# Patient Record
Sex: Male | Born: 1935
Health system: Southern US, Community
[De-identification: ages and names within clinical notes are randomized; demographics above are authoritative.]

## PROBLEM LIST (undated history)

## (undated) DIAGNOSIS — G709 Myoneural disorder, unspecified: Secondary | ICD-10-CM

## (undated) DIAGNOSIS — M109 Gout, unspecified: Secondary | ICD-10-CM

## (undated) DIAGNOSIS — D509 Iron deficiency anemia, unspecified: Secondary | ICD-10-CM

## (undated) DIAGNOSIS — Z9089 Acquired absence of other organs: Secondary | ICD-10-CM

## (undated) DIAGNOSIS — M1990 Unspecified osteoarthritis, unspecified site: Secondary | ICD-10-CM

## (undated) DIAGNOSIS — Z9842 Cataract extraction status, left eye: Secondary | ICD-10-CM

## (undated) DIAGNOSIS — G629 Polyneuropathy, unspecified: Secondary | ICD-10-CM

## (undated) DIAGNOSIS — I10 Essential (primary) hypertension: Secondary | ICD-10-CM

## (undated) DIAGNOSIS — J45909 Unspecified asthma, uncomplicated: Secondary | ICD-10-CM

## (undated) DIAGNOSIS — Z9841 Cataract extraction status, right eye: Secondary | ICD-10-CM

## (undated) DIAGNOSIS — J449 Chronic obstructive pulmonary disease, unspecified: Secondary | ICD-10-CM

## (undated) DIAGNOSIS — J189 Pneumonia, unspecified organism: Secondary | ICD-10-CM

## (undated) DIAGNOSIS — E119 Type 2 diabetes mellitus without complications: Secondary | ICD-10-CM

## (undated) HISTORY — DX: Essential (primary) hypertension: I10

## (undated) HISTORY — PX: OTHER SURGICAL HISTORY: SHX169

## (undated) HISTORY — DX: Iron deficiency anemia, unspecified: D50.9

## (undated) HISTORY — DX: Cataract extraction status, left eye: Z98.42

## (undated) HISTORY — DX: Type 2 diabetes mellitus without complications: E11.9

## (undated) HISTORY — DX: Unspecified osteoarthritis, unspecified site: M19.90

## (undated) HISTORY — DX: Gout, unspecified: M10.9

## (undated) HISTORY — DX: Cataract extraction status, right eye: Z98.41

## (undated) HISTORY — PX: CHOLECYSTECTOMY: SHX55

## (undated) HISTORY — DX: Acquired absence of other organs: Z90.89

---

## 1997-08-20 ENCOUNTER — Other Ambulatory Visit: Admission: RE | Admit: 1997-08-20 | Discharge: 1997-08-20 | Payer: Self-pay | Admitting: Family Medicine

## 2005-03-07 DIAGNOSIS — M109 Gout, unspecified: Secondary | ICD-10-CM

## 2005-03-07 HISTORY — DX: Gout, unspecified: M10.9

## 2006-11-13 ENCOUNTER — Inpatient Hospital Stay (HOSPITAL_COMMUNITY): Admission: EM | Admit: 2006-11-13 | Discharge: 2006-11-26 | Payer: Self-pay | Admitting: Emergency Medicine

## 2006-11-14 ENCOUNTER — Encounter (INDEPENDENT_AMBULATORY_CARE_PROVIDER_SITE_OTHER): Payer: Self-pay | Admitting: *Deleted

## 2006-12-11 ENCOUNTER — Ambulatory Visit: Payer: Self-pay | Admitting: Oncology

## 2006-12-11 ENCOUNTER — Inpatient Hospital Stay (HOSPITAL_COMMUNITY): Admission: EM | Admit: 2006-12-11 | Discharge: 2006-12-15 | Payer: Self-pay | Admitting: Emergency Medicine

## 2007-01-08 ENCOUNTER — Ambulatory Visit: Payer: Self-pay | Admitting: Internal Medicine

## 2007-01-09 ENCOUNTER — Inpatient Hospital Stay (HOSPITAL_COMMUNITY): Admission: EM | Admit: 2007-01-09 | Discharge: 2007-01-12 | Payer: Self-pay | Admitting: Emergency Medicine

## 2007-01-12 ENCOUNTER — Telehealth: Payer: Self-pay | Admitting: Family Medicine

## 2007-01-15 ENCOUNTER — Encounter (INDEPENDENT_AMBULATORY_CARE_PROVIDER_SITE_OTHER): Payer: Self-pay | Admitting: *Deleted

## 2007-01-15 ENCOUNTER — Ambulatory Visit: Payer: Self-pay | Admitting: Sports Medicine

## 2007-01-15 ENCOUNTER — Encounter: Payer: Self-pay | Admitting: Family Medicine

## 2007-01-15 DIAGNOSIS — J45909 Unspecified asthma, uncomplicated: Secondary | ICD-10-CM | POA: Insufficient documentation

## 2007-01-15 DIAGNOSIS — D509 Iron deficiency anemia, unspecified: Secondary | ICD-10-CM

## 2007-01-15 DIAGNOSIS — M109 Gout, unspecified: Secondary | ICD-10-CM | POA: Insufficient documentation

## 2007-01-15 HISTORY — DX: Iron deficiency anemia, unspecified: D50.9

## 2007-01-23 ENCOUNTER — Encounter: Payer: Self-pay | Admitting: Family Medicine

## 2007-01-31 ENCOUNTER — Encounter: Payer: Self-pay | Admitting: Family Medicine

## 2007-02-15 ENCOUNTER — Encounter: Payer: Self-pay | Admitting: Family Medicine

## 2007-02-15 ENCOUNTER — Ambulatory Visit: Payer: Self-pay | Admitting: Family Medicine

## 2007-02-15 LAB — CONVERTED CEMR LAB
HCT: 35.6 % — ABNORMAL LOW (ref 39.0–52.0)
Hemoglobin: 11.9 g/dL — ABNORMAL LOW (ref 13.0–17.0)
MCHC: 33.4 g/dL (ref 30.0–36.0)
MCV: 77.9 fL — ABNORMAL LOW (ref 78.0–100.0)
Platelets: 195 10*3/uL (ref 150–400)
RBC: 4.57 M/uL (ref 4.22–5.81)
RDW: 17 % — ABNORMAL HIGH (ref 11.5–15.5)
WBC: 11.5 10*3/uL — ABNORMAL HIGH (ref 4.0–10.5)

## 2007-02-19 ENCOUNTER — Telehealth: Payer: Self-pay | Admitting: *Deleted

## 2007-02-20 ENCOUNTER — Ambulatory Visit: Payer: Self-pay | Admitting: Family Medicine

## 2007-02-20 ENCOUNTER — Inpatient Hospital Stay (HOSPITAL_COMMUNITY): Admission: EM | Admit: 2007-02-20 | Discharge: 2007-02-22 | Payer: Self-pay | Admitting: Emergency Medicine

## 2007-02-20 ENCOUNTER — Encounter: Payer: Self-pay | Admitting: Family Medicine

## 2007-02-26 ENCOUNTER — Telehealth (INDEPENDENT_AMBULATORY_CARE_PROVIDER_SITE_OTHER): Payer: Self-pay | Admitting: Family Medicine

## 2007-03-07 ENCOUNTER — Encounter: Payer: Self-pay | Admitting: Family Medicine

## 2007-03-12 ENCOUNTER — Ambulatory Visit: Payer: Self-pay | Admitting: Sports Medicine

## 2007-03-12 ENCOUNTER — Encounter: Admission: RE | Admit: 2007-03-12 | Discharge: 2007-03-12 | Payer: Self-pay | Admitting: Sports Medicine

## 2007-03-12 LAB — CONVERTED CEMR LAB
Bilirubin Urine: NEGATIVE
Blood in Urine, dipstick: NEGATIVE
Glucose, Urine, Semiquant: NEGATIVE
Ketones, urine, test strip: NEGATIVE
Nitrite: NEGATIVE
Protein, U semiquant: NEGATIVE
Specific Gravity, Urine: <1.005
Urobilinogen, UA: 0.2
WBC Urine, dipstick: NEGATIVE
pH: 6.5

## 2007-03-16 ENCOUNTER — Telehealth: Payer: Self-pay | Admitting: *Deleted

## 2007-03-20 ENCOUNTER — Ambulatory Visit: Payer: Self-pay | Admitting: Family Medicine

## 2007-03-20 ENCOUNTER — Encounter: Payer: Self-pay | Admitting: Family Medicine

## 2007-03-20 DIAGNOSIS — J449 Chronic obstructive pulmonary disease, unspecified: Secondary | ICD-10-CM | POA: Insufficient documentation

## 2007-03-20 DIAGNOSIS — F17200 Nicotine dependence, unspecified, uncomplicated: Secondary | ICD-10-CM | POA: Insufficient documentation

## 2007-04-04 ENCOUNTER — Encounter (INDEPENDENT_AMBULATORY_CARE_PROVIDER_SITE_OTHER): Payer: Self-pay | Admitting: Surgery

## 2007-04-04 ENCOUNTER — Ambulatory Visit (HOSPITAL_COMMUNITY): Admission: RE | Admit: 2007-04-04 | Discharge: 2007-04-05 | Payer: Self-pay | Admitting: Surgery

## 2007-04-12 ENCOUNTER — Ambulatory Visit: Payer: Self-pay | Admitting: Family Medicine

## 2007-04-12 ENCOUNTER — Encounter: Payer: Self-pay | Admitting: Family Medicine

## 2007-04-12 DIAGNOSIS — Z9089 Acquired absence of other organs: Secondary | ICD-10-CM

## 2007-04-12 HISTORY — DX: Acquired absence of other organs: Z90.89

## 2007-04-12 LAB — CONVERTED CEMR LAB
Bilirubin Urine: NEGATIVE
Glucose, Urine, Semiquant: NEGATIVE
Ketones, urine, test strip: NEGATIVE
Nitrite: POSITIVE
Protein, U semiquant: 30
Urobilinogen, UA: 0.2
pH: 6

## 2007-09-11 ENCOUNTER — Ambulatory Visit: Payer: Self-pay | Admitting: Family Medicine

## 2007-09-14 ENCOUNTER — Ambulatory Visit: Payer: Self-pay | Admitting: Family Medicine

## 2007-09-14 ENCOUNTER — Encounter: Payer: Self-pay | Admitting: Family Medicine

## 2007-09-16 LAB — CONVERTED CEMR LAB
BUN: 15 mg/dL (ref 6–23)
Basophils Absolute: 0 10*3/uL (ref 0.0–0.1)
Basophils Relative: 0 % (ref 0–1)
CO2: 23 meq/L (ref 19–32)
Calcium: 9.2 mg/dL (ref 8.4–10.5)
Chloride: 106 meq/L (ref 96–112)
Creatinine, Ser: 0.97 mg/dL (ref 0.40–1.50)
Eosinophils Absolute: 0.7 10*3/uL (ref 0.0–0.7)
Eosinophils Relative: 6 % — ABNORMAL HIGH (ref 0–5)
Glucose, Bld: 85 mg/dL (ref 70–99)
HCT: 37 % — ABNORMAL LOW (ref 39.0–52.0)
Hemoglobin: 12.6 g/dL — ABNORMAL LOW (ref 13.0–17.0)
Lymphocytes Relative: 39 % (ref 12–46)
Lymphs Abs: 4.5 10*3/uL — ABNORMAL HIGH (ref 0.7–4.0)
MCHC: 34.1 g/dL (ref 30.0–36.0)
MCV: 75.7 fL — ABNORMAL LOW (ref 78.0–100.0)
Monocytes Absolute: 0.8 10*3/uL (ref 0.1–1.0)
Monocytes Relative: 7 % (ref 3–12)
Neutro Abs: 5.4 10*3/uL (ref 1.7–7.7)
Neutrophils Relative %: 48 % (ref 43–77)
Platelets: 259 10*3/uL (ref 150–400)
Potassium: 4.5 meq/L (ref 3.5–5.3)
RBC: 4.89 M/uL (ref 4.22–5.81)
RDW: 15.6 % — ABNORMAL HIGH (ref 11.5–15.5)
Sodium: 140 meq/L (ref 135–145)
WBC: 11.4 10*3/uL — ABNORMAL HIGH (ref 4.0–10.5)

## 2008-01-24 ENCOUNTER — Encounter: Payer: Self-pay | Admitting: Family Medicine

## 2008-04-29 ENCOUNTER — Ambulatory Visit: Payer: Self-pay | Admitting: Family Medicine

## 2008-04-29 ENCOUNTER — Encounter: Payer: Self-pay | Admitting: Family Medicine

## 2008-04-29 LAB — CONVERTED CEMR LAB
Glucose, Urine, Semiquant: NEGATIVE
PSA: 2.13 ng/mL

## 2008-05-02 ENCOUNTER — Encounter: Payer: Self-pay | Admitting: Family Medicine

## 2008-05-02 LAB — CONVERTED CEMR LAB
BUN: 19 mg/dL (ref 6–23)
CO2: 18 meq/L — ABNORMAL LOW (ref 19–32)
Calcium: 9.1 mg/dL (ref 8.4–10.5)
Chloride: 109 meq/L (ref 96–112)
Creatinine, Ser: 1 mg/dL (ref 0.40–1.50)
Glucose, Bld: 83 mg/dL (ref 70–99)
HCT: 40.4 % (ref 39.0–52.0)
Hemoglobin: 13.9 g/dL (ref 13.0–17.0)
MCHC: 34.4 g/dL (ref 30.0–36.0)
MCV: 78.6 fL (ref 78.0–100.0)
PSA: 2.13 ng/mL (ref 0.10–4.00)
Platelets: 215 10*3/uL (ref 150–400)
Potassium: 4.7 meq/L (ref 3.5–5.3)
RBC: 5.14 M/uL (ref 4.22–5.81)
RDW: 15.8 % — ABNORMAL HIGH (ref 11.5–15.5)
Sodium: 143 meq/L (ref 135–145)
WBC: 13.1 10*3/uL — ABNORMAL HIGH (ref 4.0–10.5)

## 2008-05-09 ENCOUNTER — Encounter: Payer: Self-pay | Admitting: Family Medicine

## 2008-06-03 ENCOUNTER — Encounter: Payer: Self-pay | Admitting: Family Medicine

## 2008-09-04 ENCOUNTER — Ambulatory Visit: Payer: Self-pay | Admitting: Family Medicine

## 2008-09-04 ENCOUNTER — Encounter: Payer: Self-pay | Admitting: Family Medicine

## 2008-09-04 DIAGNOSIS — R5383 Other fatigue: Secondary | ICD-10-CM | POA: Insufficient documentation

## 2008-09-04 DIAGNOSIS — R5381 Other malaise: Secondary | ICD-10-CM | POA: Insufficient documentation

## 2008-09-04 LAB — CONVERTED CEMR LAB
ALT: 13 units/L (ref 0–53)
AST: 12 units/L (ref 0–37)
Albumin: 4.5 g/dL (ref 3.5–5.2)
Alkaline Phosphatase: 91 units/L (ref 39–117)
BUN: 16 mg/dL (ref 6–23)
Bilirubin Urine: NEGATIVE
CO2: 22 meq/L (ref 19–32)
Calcium: 8.9 mg/dL (ref 8.4–10.5)
Chloride: 105 meq/L (ref 96–112)
Creatinine, Ser: 1.24 mg/dL (ref 0.40–1.50)
Glucose, Bld: 67 mg/dL — ABNORMAL LOW (ref 70–99)
Glucose, Urine, Semiquant: NEGATIVE
HCT: 37.8 % — ABNORMAL LOW (ref 39.0–52.0)
Hemoglobin: 13.3 g/dL (ref 13.0–17.0)
Ketones, urine, test strip: NEGATIVE
MCHC: 35.2 g/dL (ref 30.0–36.0)
MCV: 78.6 fL (ref 78.0–100.0)
Nitrite: POSITIVE
Platelets: 168 10*3/uL (ref 150–400)
Potassium: 4.1 meq/L (ref 3.5–5.3)
Protein, U semiquant: 100
RBC: 4.81 M/uL (ref 4.22–5.81)
RDW: 15.7 % — ABNORMAL HIGH (ref 11.5–15.5)
Sodium: 141 meq/L (ref 135–145)
Specific Gravity, Urine: 1.015
Total Bilirubin: 0.9 mg/dL (ref 0.3–1.2)
Total Protein: 7 g/dL (ref 6.0–8.3)
Urobilinogen, UA: 1
WBC, UA: >20 cells/hpf
WBC: 24.4 10*3/uL — ABNORMAL HIGH (ref 4.0–10.5)
pH: 5.5

## 2008-09-05 ENCOUNTER — Encounter: Payer: Self-pay | Admitting: Family Medicine

## 2008-09-09 ENCOUNTER — Encounter: Admission: RE | Admit: 2008-09-09 | Discharge: 2008-09-09 | Payer: Self-pay | Admitting: Family Medicine

## 2008-09-12 ENCOUNTER — Ambulatory Visit: Payer: Self-pay | Admitting: Family Medicine

## 2008-09-12 ENCOUNTER — Encounter: Admission: RE | Admit: 2008-09-12 | Discharge: 2008-09-12 | Payer: Self-pay | Admitting: Family Medicine

## 2008-09-17 ENCOUNTER — Encounter: Payer: Self-pay | Admitting: Family Medicine

## 2008-10-03 ENCOUNTER — Ambulatory Visit: Payer: Self-pay | Admitting: Family Medicine

## 2008-12-23 ENCOUNTER — Encounter: Payer: Self-pay | Admitting: Family Medicine

## 2009-01-16 ENCOUNTER — Ambulatory Visit: Payer: Self-pay | Admitting: Family Medicine

## 2009-07-01 ENCOUNTER — Encounter: Payer: Self-pay | Admitting: Family Medicine

## 2009-07-01 ENCOUNTER — Ambulatory Visit: Payer: Self-pay | Admitting: Family Medicine

## 2009-07-01 ENCOUNTER — Encounter: Admission: RE | Admit: 2009-07-01 | Discharge: 2009-07-01 | Payer: Self-pay | Admitting: Family Medicine

## 2009-07-01 DIAGNOSIS — M545 Low back pain, unspecified: Secondary | ICD-10-CM | POA: Insufficient documentation

## 2009-07-01 DIAGNOSIS — M25559 Pain in unspecified hip: Secondary | ICD-10-CM | POA: Insufficient documentation

## 2009-07-02 LAB — CONVERTED CEMR LAB
ALT: 12 units/L (ref 0–53)
AST: 11 units/L (ref 0–37)
Albumin: 4.3 g/dL (ref 3.5–5.2)
Alkaline Phosphatase: 80 units/L (ref 39–117)
BUN: 16 mg/dL (ref 6–23)
Basophils Absolute: 0.1 10*3/uL (ref 0.0–0.1)
Basophils Relative: 0 % (ref 0–1)
CO2: 20 meq/L (ref 19–32)
Calcium: 9.1 mg/dL (ref 8.4–10.5)
Chloride: 107 meq/L (ref 96–112)
Creatinine, Ser: 1.05 mg/dL (ref 0.40–1.50)
Eosinophils Absolute: 0.8 10*3/uL — ABNORMAL HIGH (ref 0.0–0.7)
Eosinophils Relative: 6 % — ABNORMAL HIGH (ref 0–5)
Glucose, Bld: 90 mg/dL (ref 70–99)
HCT: 39.5 % (ref 39.0–52.0)
Hemoglobin: 13.4 g/dL (ref 13.0–17.0)
Lymphocytes Relative: 28 % (ref 12–46)
Lymphs Abs: 4.1 10*3/uL — ABNORMAL HIGH (ref 0.7–4.0)
MCHC: 33.9 g/dL (ref 30.0–36.0)
MCV: 75.5 fL — ABNORMAL LOW (ref 78.0–100.0)
Monocytes Absolute: 1 10*3/uL (ref 0.1–1.0)
Monocytes Relative: 7 % (ref 3–12)
Neutro Abs: 8.9 10*3/uL — ABNORMAL HIGH (ref 1.7–7.7)
Neutrophils Relative %: 60 % (ref 43–77)
Platelets: 218 10*3/uL (ref 150–400)
Potassium: 3.6 meq/L (ref 3.5–5.3)
RBC: 5.23 M/uL (ref 4.22–5.81)
RDW: 15.6 % — ABNORMAL HIGH (ref 11.5–15.5)
Sodium: 140 meq/L (ref 135–145)
Total Bilirubin: 0.4 mg/dL (ref 0.3–1.2)
Total Protein: 7.2 g/dL (ref 6.0–8.3)
WBC: 14.9 10*3/uL — ABNORMAL HIGH (ref 4.0–10.5)

## 2009-09-10 ENCOUNTER — Ambulatory Visit: Payer: Self-pay | Admitting: Family Medicine

## 2009-09-10 DIAGNOSIS — F528 Other sexual dysfunction not due to a substance or known physiological condition: Secondary | ICD-10-CM | POA: Insufficient documentation

## 2009-09-17 ENCOUNTER — Encounter: Payer: Self-pay | Admitting: Family Medicine

## 2009-09-17 DIAGNOSIS — N529 Male erectile dysfunction, unspecified: Secondary | ICD-10-CM | POA: Insufficient documentation

## 2009-11-25 IMAGING — CR DG CHEST 2V
2 series · 2 of 2 positions shown · non-contrast
Comparison: 11/17/06.

CLINICAL DATA: Abdominal pain.
 CHEST ? 2 VIEW:

[w chest pa]
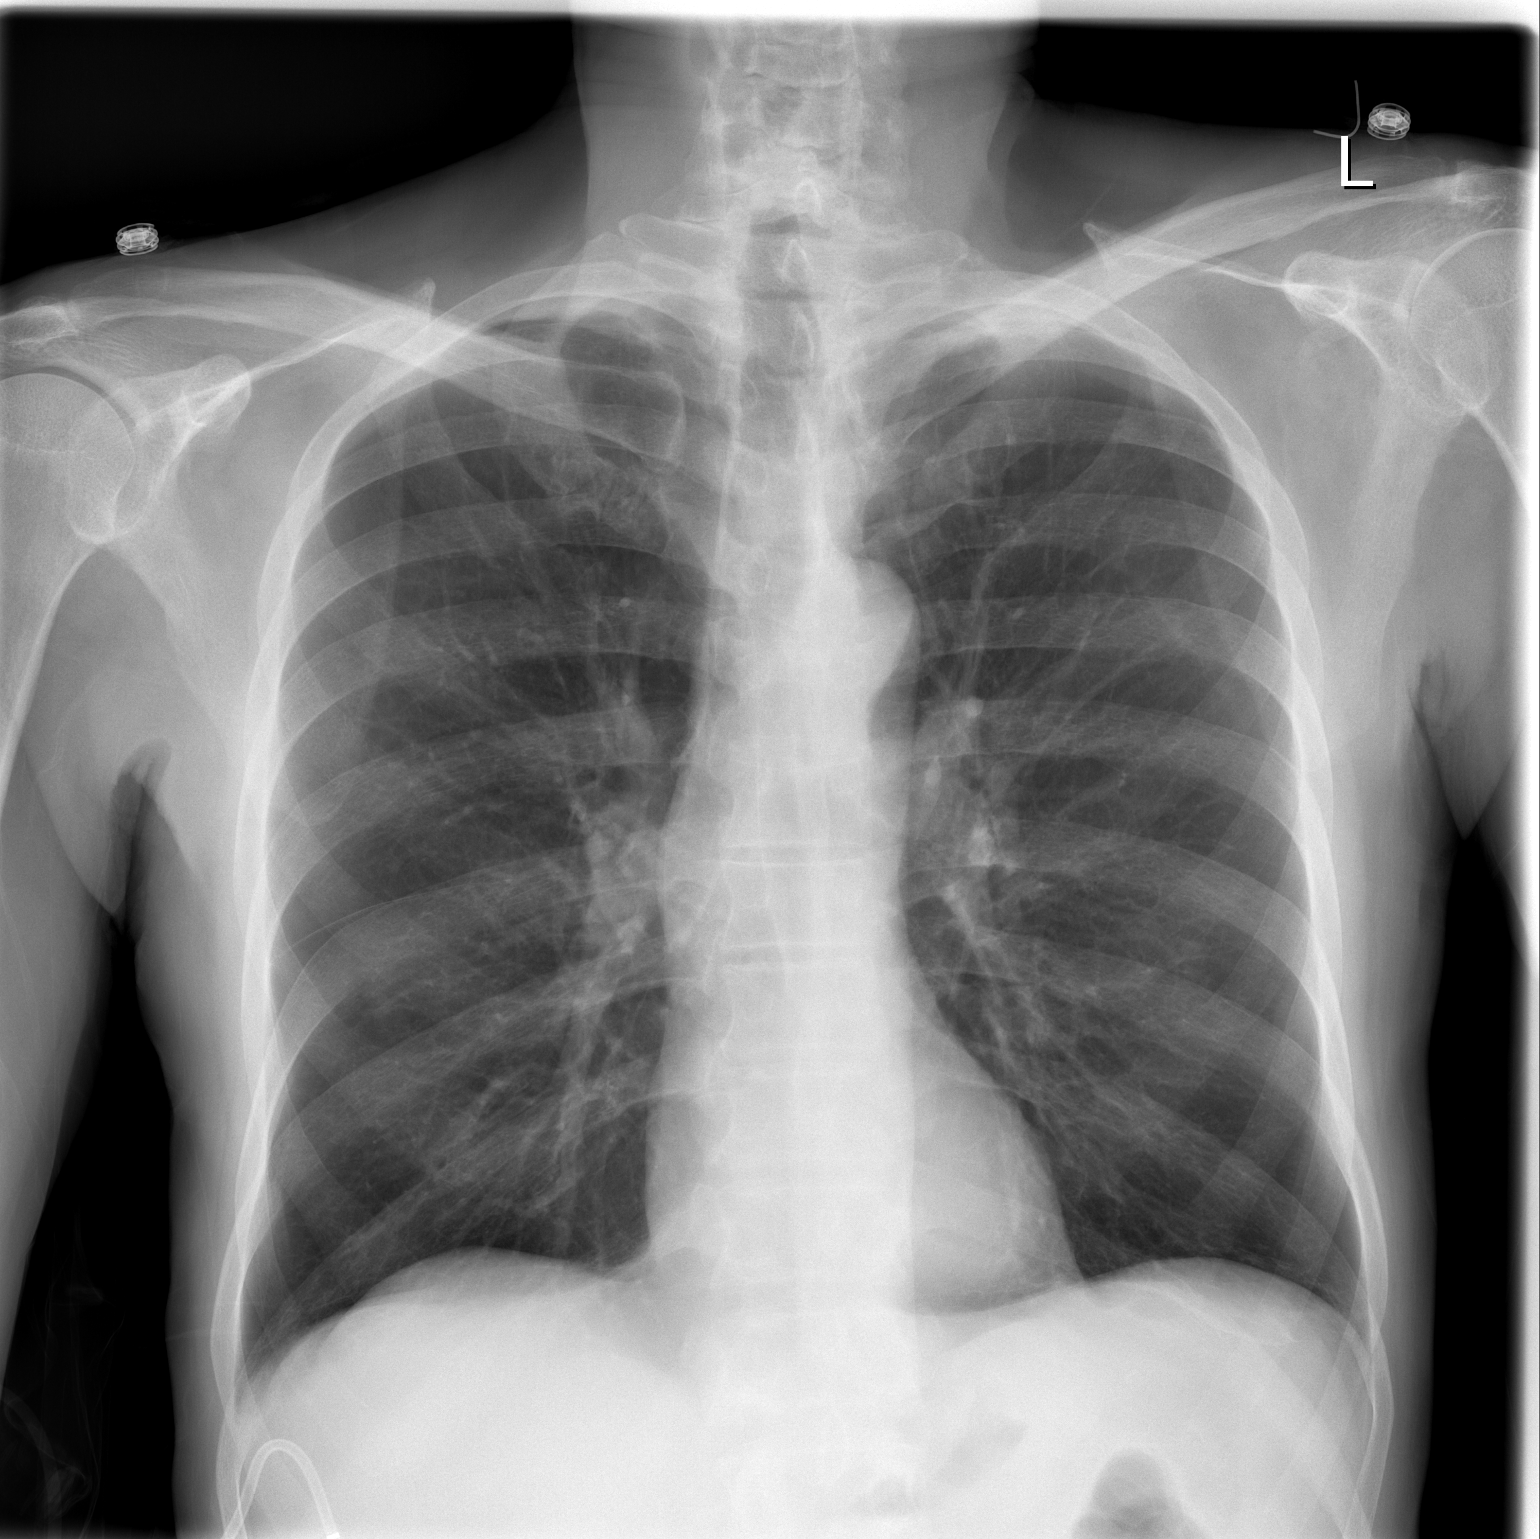

[w chest lat]
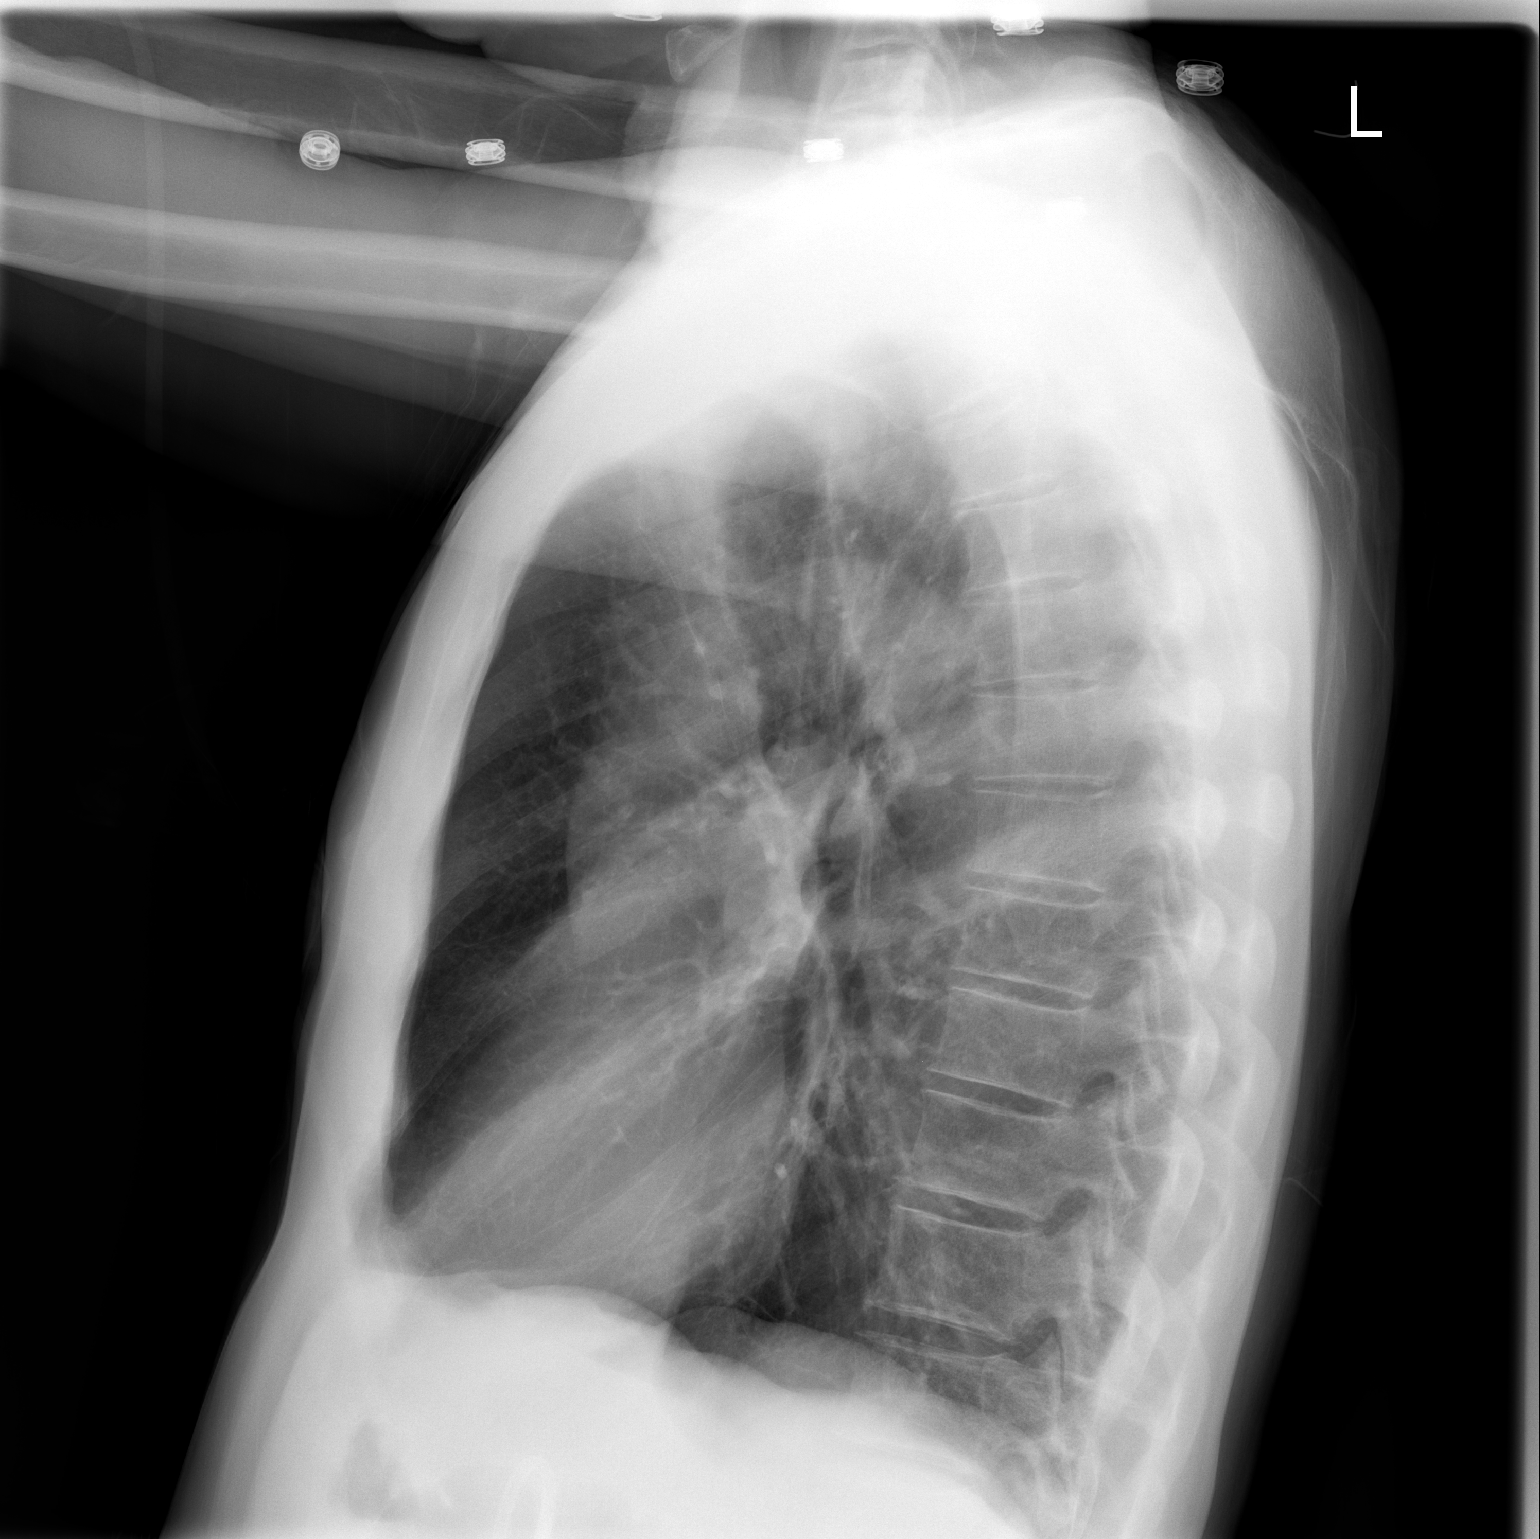

[2 of 2 positions shown; findings below may reference images not displayed]

FINDINGS: Nasogastric tube and left PICC line have been removed.  Lungs are better aerated and clear.  Hyperinflation suggests COPD.  Heart size is normal.  A catheter projects over the right upper quadrant but is partly visualized.  No acute osseous finding.
IMPRESSION: COPD.  No acute cardiopulmonary process.

## 2009-11-25 IMAGING — CR DG ABDOMEN 2V
2 series · 2 of 2 positions shown · non-contrast
Comparison: none

CLINICAL DATA: Abdominal pain, elevated liver enzymes.  
 ABDOMEN ? 2 VIEW:

[w abdomen upright]
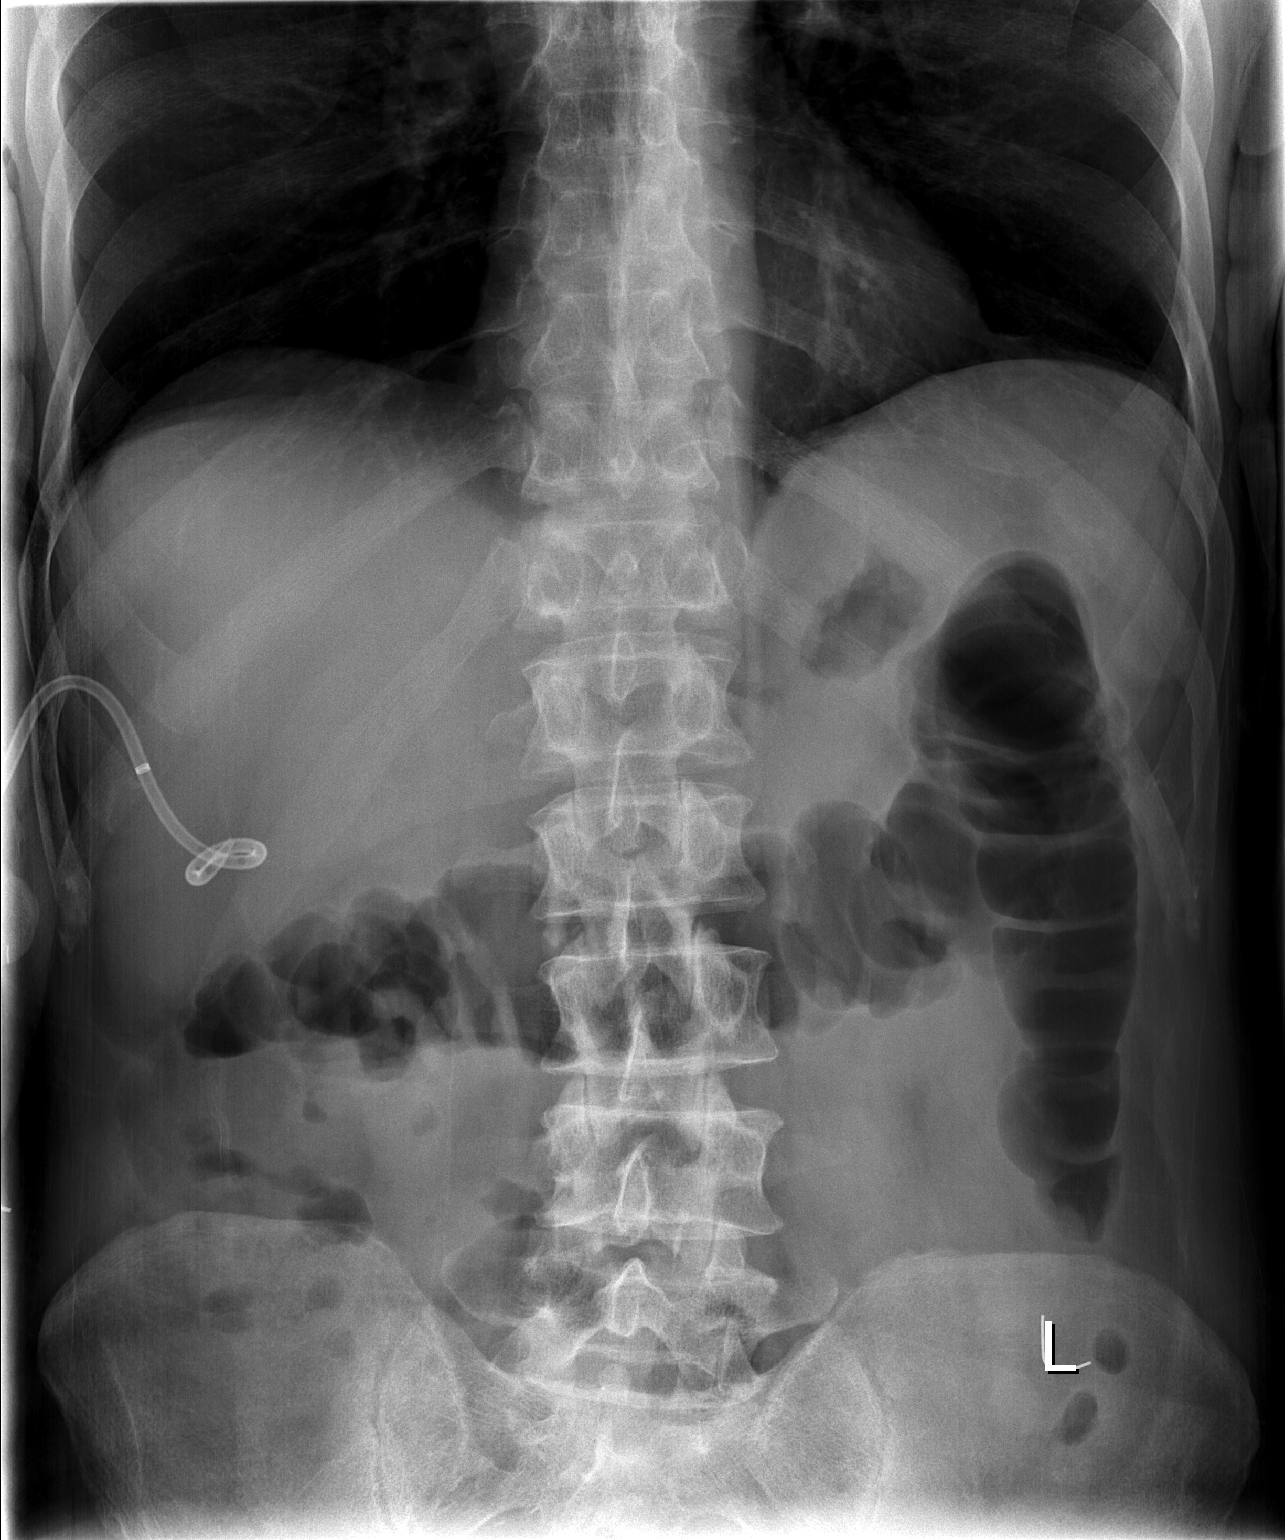

[t abdomen supine]
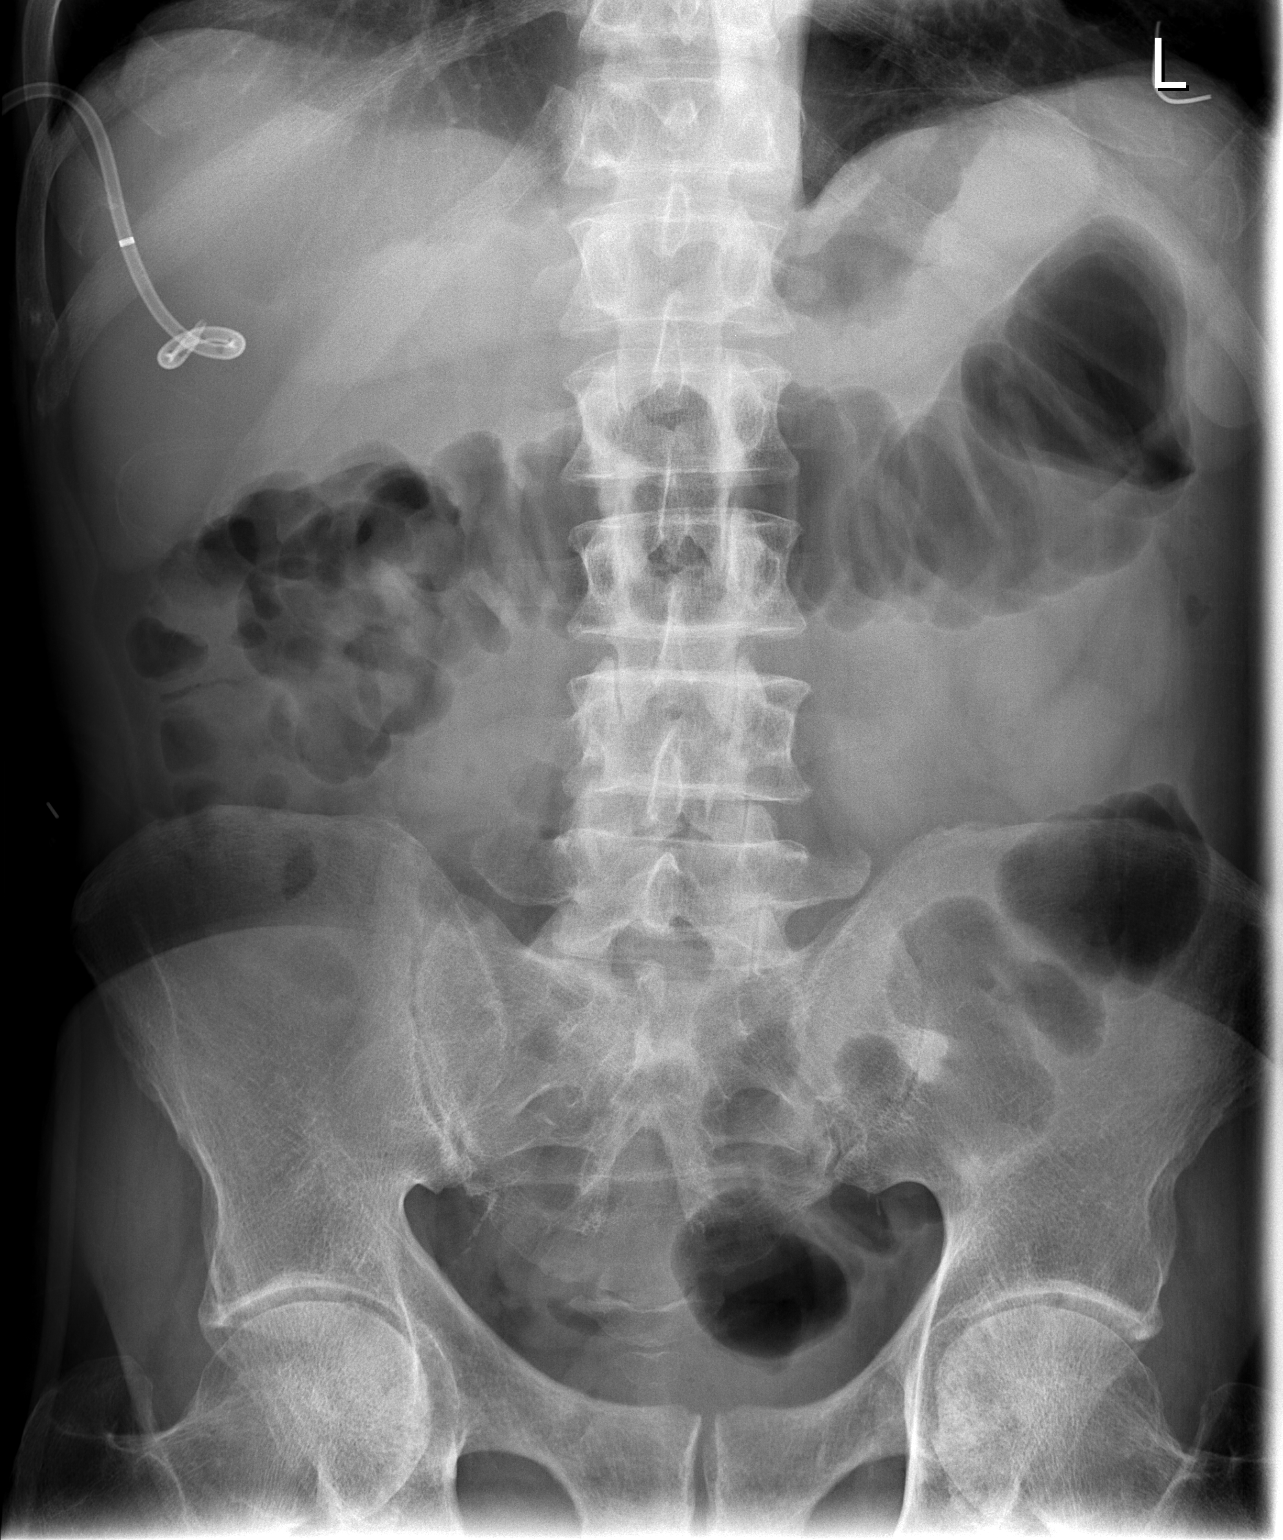

[2 of 2 positions shown; findings below may reference images not displayed]

FINDINGS: Right upper quadrant pigtail catheter in place.  Bowel gas pattern is normal.  No free air beneath the diaphragms.  No abnormal calcific opacity.  No acute bony finding.
IMPRESSION: No acute intraabdominal finding.

## 2009-11-26 IMAGING — XA IR BILIARY CATHETER EXCHANGE
1 series · 7 of 7 positions shown · non-contrast
Comparison: none

CLINICAL DATA: Chronic cholecystitis.
EXCHANGE OF CHRONIC TRANSHEPATIC CHOLECYSTOSTOMY CATHETER:
Guidance:  Fluoroscopic.
Complications:  No immediate.
Procedure/Findings:   Informed consent was obtained from the patient following explanation of the procedure, risks, benefits, and alternatives.  The patient understands, agrees, and consents.  All questions were addressed.
In the right upper quadrant, the existing cholecystostomy catheter was prepped and draped in the usual fashion.  Contrast injection confirms position in the gallbladder fundus.  The catheter was cut and removed over a guidewire followed by advancement of a new 10 French catheter with the retention loop formed more centrally in the gallbladder close to the cystic duct origin.  Contrast injection confirms postiion in the gallbladder.  Cystic duct is not visualized.  Catheter was secured with a 0 Prolene suture and connected to external drainage.

[Series 1: run · 7 of 7 slices shown]
[im 1/7]
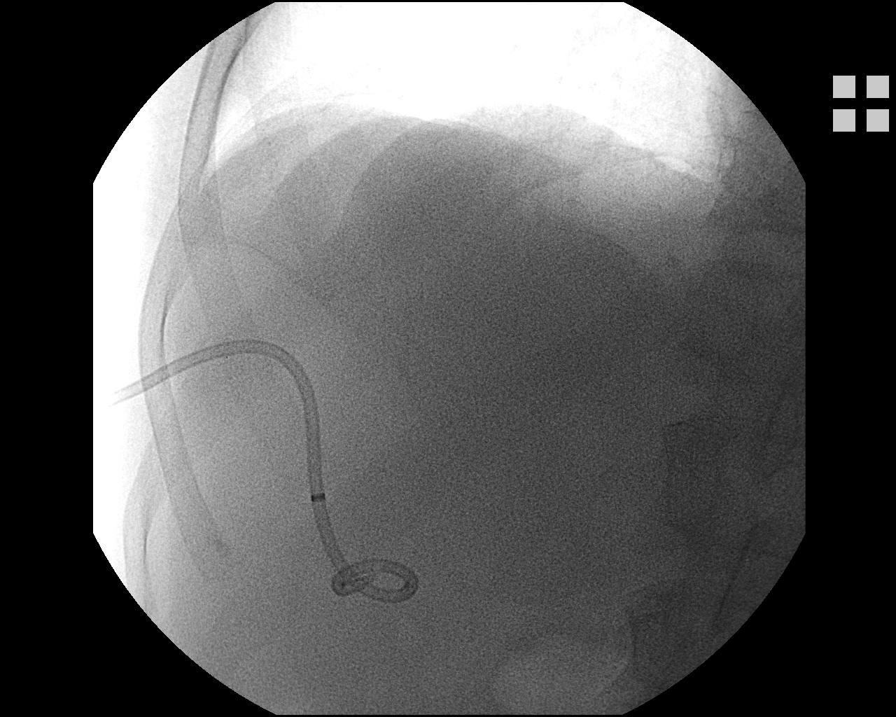
[im 2/7]
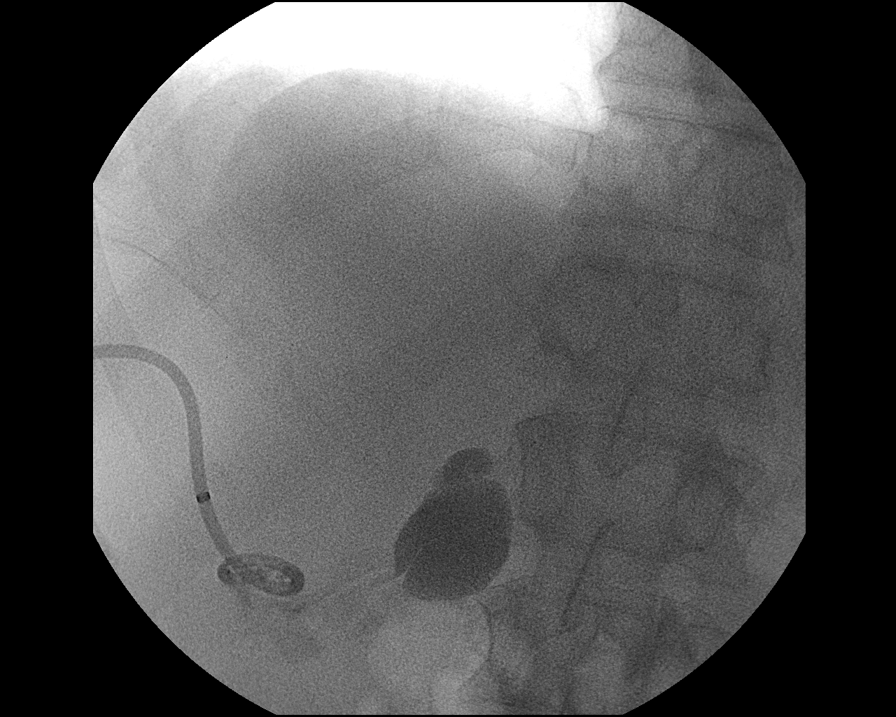
[im 3/7]
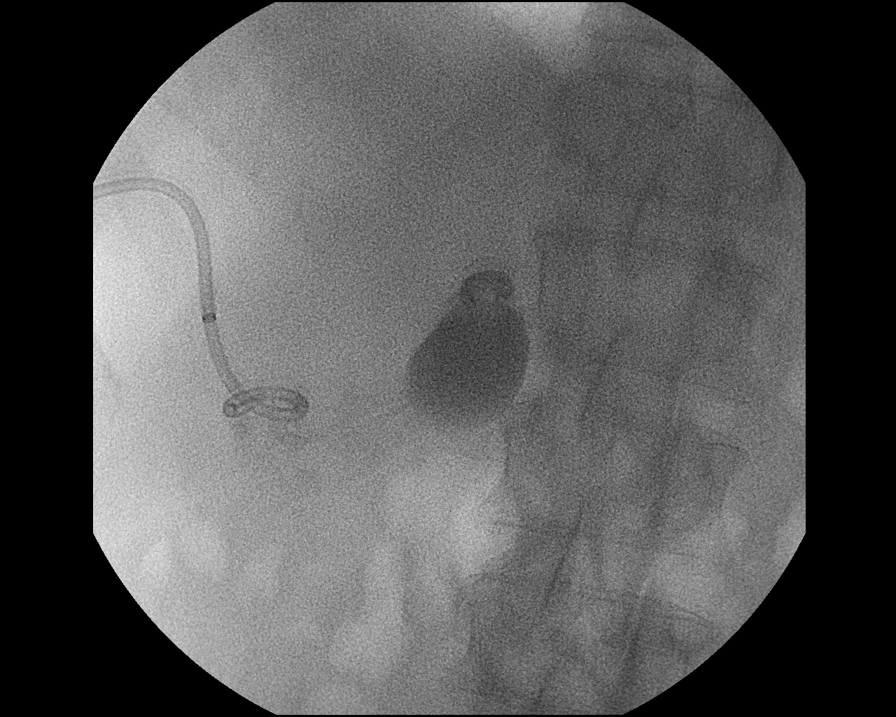
[im 4/7]
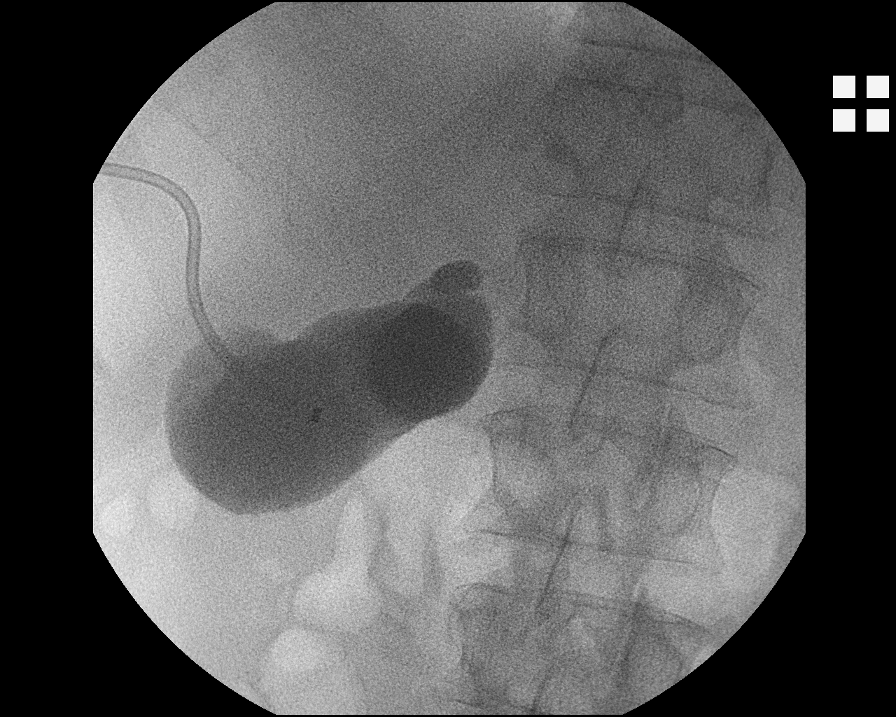
[im 5/7]
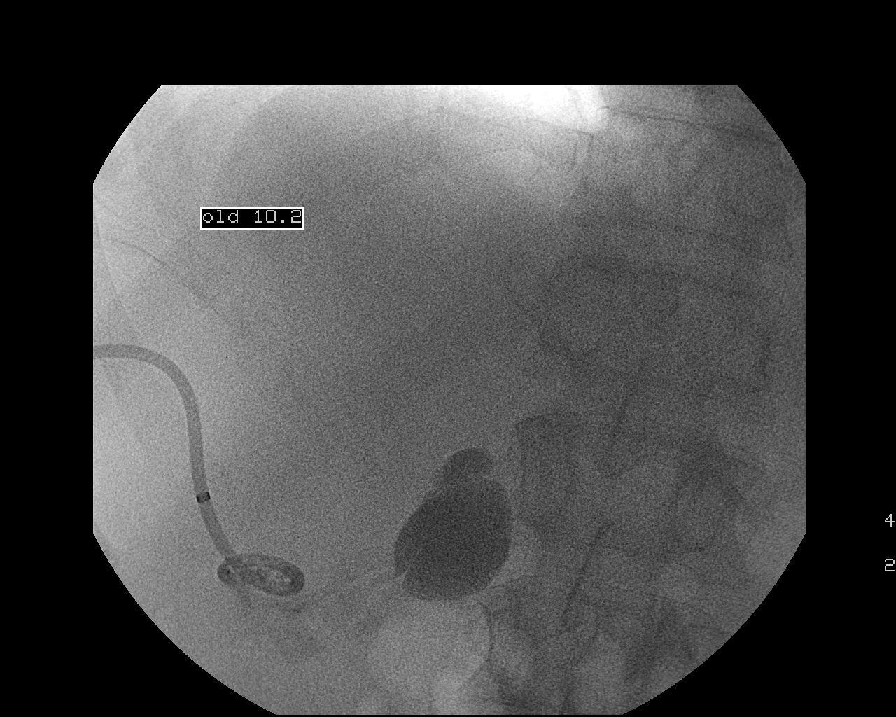
[im 6/7]
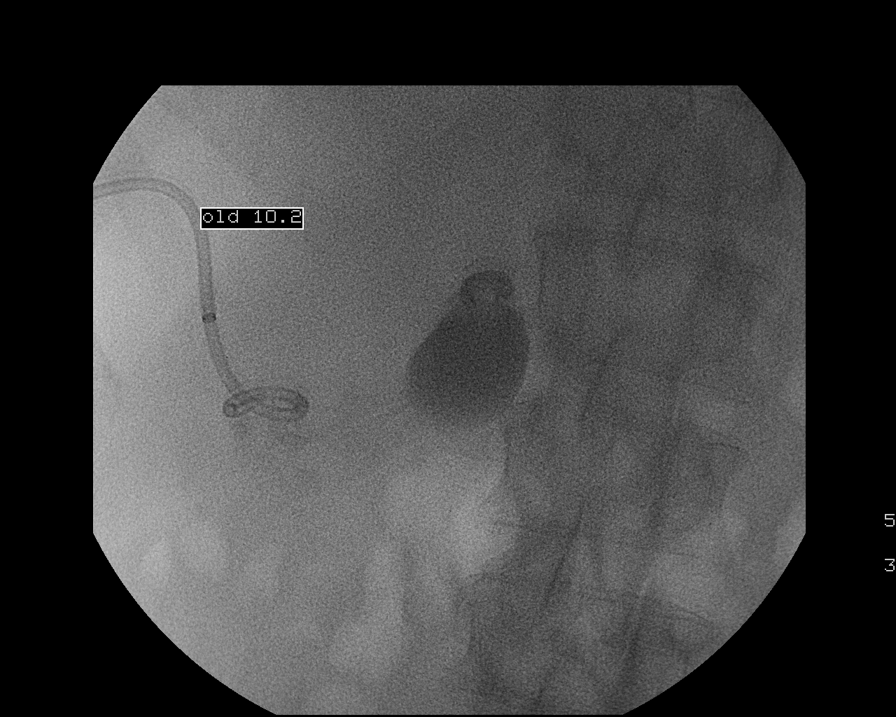
[im 7/7]
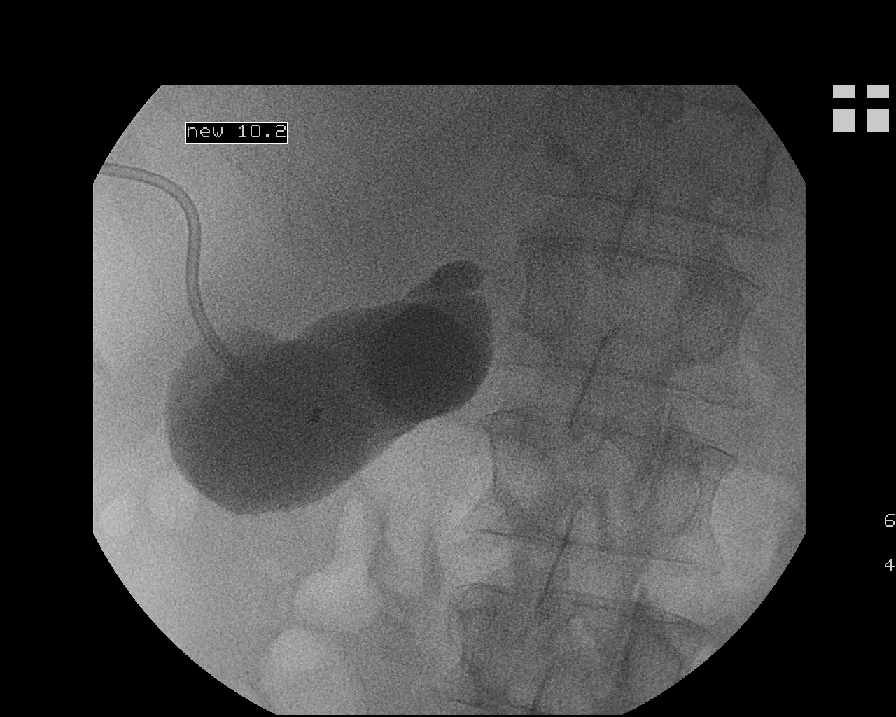

[7 of 7 positions shown; findings below may reference images not displayed]

IMPRESSION: Uncomplicated exchange of a transhepatic cholecystostomy.

## 2009-12-04 ENCOUNTER — Encounter: Payer: Self-pay | Admitting: Family Medicine

## 2010-01-11 ENCOUNTER — Ambulatory Visit: Payer: Self-pay | Admitting: Family Medicine

## 2010-01-11 ENCOUNTER — Encounter: Payer: Self-pay | Admitting: Sports Medicine

## 2010-03-28 ENCOUNTER — Encounter: Payer: Self-pay | Admitting: Interventional Radiology

## 2010-04-04 LAB — CONVERTED CEMR LAB
ALT: 18 units/L (ref 0–53)
AST: 13 units/L (ref 0–37)
Albumin: 4.3 g/dL (ref 3.5–5.2)
Alkaline Phosphatase: 82 units/L (ref 39–117)
BUN: 16 mg/dL (ref 6–23)
CO2: 23 meq/L (ref 19–32)
Calcium: 8.8 mg/dL (ref 8.4–10.5)
Chloride: 109 meq/L (ref 96–112)
Creatinine, Ser: 1.12 mg/dL (ref 0.40–1.50)
Glucose, Bld: 110 mg/dL — ABNORMAL HIGH (ref 70–99)
HCT: 34.6 % — ABNORMAL LOW (ref 39.0–52.0)
Hemoglobin: 11.7 g/dL — ABNORMAL LOW (ref 13.0–17.0)
MCHC: 33.8 g/dL (ref 30.0–36.0)
MCV: 76.4 fL — ABNORMAL LOW (ref 78.0–100.0)
Platelets: 194 10*3/uL (ref 150–400)
Potassium: 3.5 meq/L (ref 3.5–5.3)
RBC: 4.53 M/uL (ref 4.22–5.81)
RDW: 15.6 % — ABNORMAL HIGH (ref 11.5–15.5)
Sodium: 145 meq/L (ref 135–145)
Total Bilirubin: 0.7 mg/dL (ref 0.3–1.2)
Total Protein: 7 g/dL (ref 6.0–8.3)
Uric Acid, Serum: 8.7 mg/dL — ABNORMAL HIGH (ref 4.0–7.8)
WBC: 11.5 10*3/uL — ABNORMAL HIGH (ref 4.0–10.5)

## 2010-04-06 ENCOUNTER — Telehealth: Payer: Self-pay | Admitting: Family Medicine

## 2010-04-08 NOTE — Letter (Signed)
Summary: Generic Letter  [facility]  [address]   [place], [place] [address]   Phone: [phone]  Fax: [phone]    02/20/2007  [place], [place]  [address]  ED Physician,  [name] was scheduled as a work-in appointment with me this afternoon.  He has a percutaneous cholecystostomy tube with bag drainage and is to have a lap chole in the future (to be arranged with surgery per his PCP).  The past couple days he has had decreased drainage from the tube/bag along with paroxysmal RUQ abdominal pain, chills, sweats so I am sending him to the ED for further workup and tests as you see fit - he likely needs an ultrasound and possible eval by IR to ensure the drain is working.  Sincerely,   Karlton Lemon MD [place]

## 2010-04-08 NOTE — Miscellaneous (Signed)
Summary: Stop FeSO4  Stop FeSO4.  No longer anemic.  Last Hgb 13.3 (09/04/2008)..............Marland KitchenDion Body, MD  Clinical Lists Changes  Medications: Removed medication of FERROUS SULFATE 324 MG  TABS (FERROUS SULFATE) one tablet by mouth two times a day for anemia

## 2010-04-08 NOTE — Assessment & Plan Note (Signed)
Summary: PFTS  Rx Clinic   Vital Signs:  Patient Profile:   77 Years Old Male Height:     69 inches Weight:      145 pounds BMI:     21.49 Pulse rate:   68 / minute BP sitting:   148 / 71  (left arm)                 PCP:  [name], MD   History of Present Illness: Short of breath with only 1 flight of stairs.  Minimal symptoms at rest.  MInimal use of albuterol - rarely feels he needs it.  Only using Atrovent twice daily.   Current Allergies (reviewed today): No known allergies  Updated/Current Medications (including changes made in today's visit):  FERROUS SULFATE 324 MG  TABS (FERROUS SULFATE) one tablet by mouth two times a day for anemia PROVENTIL HFA 108 (90 BASE) MCG/ACT  AERS (ALBUTEROL SULFATE) 2 puffs inh q 4h as needed wheezing COLCHICINE 0.6 MG  TABS (COLCHICINE) take one tablet by mouth every 6 hours until symptoms resolve or GI upset;  return to 1 tablet twice daily after 3 days ADVAIR DISKUS 250-50 MCG/DOSE  MISC (FLUTICASONE-SALMETEROL) 1 puff in the morning and in the evening OXYCODONE HCL 5 MG  TABS (OXYCODONE HCL) take 1-2 tablets by mouth every 6 hours as needed for pain     Risk Factors:  Tobacco use:  previous    Year started:  1948      Impression & Recommendations:  Problem # 1:  COPD (ICD-496) Assessment: Deteriorated Diagnosed with asthma as a child, and treated primarily with primatine tablets since the 1960s.  Started inhalers 10+ years ago.  Currently using minimal Albuterol rescue treatments. Reports using Atrovent and Asmanex on scheduled basis Twice daily.  History of bronchitis, and hospitalized 15+ years ago for it.  PFTs show severe obstruction and significant bronchodilator response.   Switch to LABA combination with corticosteroid and reassess symptoms for improvement.  Currently patient is SOB after climbing one flight of stairs.    His updated medication list for this problem includes:    Proventil Hfa 108 (90  Base) Mcg/act Aers (Albuterol sulfate) .Marland Kitchen... 2 puffs inh q 4h as needed wheezing    Advair Diskus 250-50 Mcg/dose Misc (Fluticasone-salmeterol) .Marland Kitchen... 1 puff in the morning and in the evening  Orders: PFT Baseline-Pre/Post Bronchodiolator (PFT Baseline-Pre/Pos)   Problem # 2:  TOBACCO USE, QUIT (ICD-V15.82) Assessment: Unchanged Continues to remain free from smoking. Patient started smoking in 1950, and previously quit smoking. Through group classes at the [place] and quitting cold [place], he has sucessfully quit since Nov 2007.  (He was offered nicotine patches and either Zyban/Chantix, but never used either to assist in cessation.)  Denies waking with productive cough.  Still fearful of relapsing and still craves cigarettes.  Supportive wife.  Reports less coughing in the AM after smoking cessation. No family members at home currently smoke.   Reinforced importance of continued abstinence from smoking.  Orders: PFT Baseline-Pre/Post Bronchodiolator (PFT Baseline-Pre/Pos)   Complete Medication List: 1)  Ferrous Sulfate 324 Mg Tabs (Ferrous sulfate) .... One tablet by mouth two times a day for anemia 2)  Proventil Hfa 108 (90 Base) Mcg/act Aers (Albuterol sulfate) .... 2 puffs inh q 4h as needed wheezing 3)  Colchicine 0.6 Mg Tabs (Colchicine) .... Take one tablet by mouth every 6 hours until symptoms resolve or gi upset;  return to 1 tablet twice daily after 3 days  4)  Advair Diskus 250-50 Mcg/dose Misc (Fluticasone-salmeterol) .Marland Kitchen.. 1 puff in the morning and in the evening 5)  Oxycodone Hcl 5 Mg Tabs (Oxycodone hcl) .... Take 1-2 tablets by mouth every 6 hours as needed for pain  Asthma Management Plan:  Updated Asthma Medications:    Proventil Hfa 108 (90 Base) Mcg/act Aers (Albuterol sulfate) .Marland Kitchen... 2 puffs inh q 4h as needed wheezing    Advair Diskus 250-50 Mcg/dose Misc (Fluticasone-salmeterol) .Marland Kitchen... 1 puff in the morning and in the evening    Patient Instructions: 1)  Change in  medications per Dr. Netty Starring. 2)  Start Advair 250/50 twice daily. 3)  Stop Asmanex 4)  Stop Atrovent 5)  Continue to remain smoke free.     ]  SPIROMETRY RESULTS height inches: 69 Gender: Male  Parameter  Measured Predicted %Predicted  FVC      2.57        4.33      59  FEV1      1.06        3.32      32  FEV1/FVC      0.41        0.77      53  FEF25-75      0.56        2.99      19  PEF      103        494      21  Post-Bronchodilator  Parameter Measured Change from Baseline  FVC     3.41        132  FEV1     1.17        110  FEV1/FVC      0.34        82  FEF25-75      0.49        87  PEF     121        117  Pulse Oximetry Pulse: 68  Comments: Effort was good.  No issues with test administration.  Evaluation:  severe obstruction with significant bronchodilator response  Recommendations:   Consideration for LABA/corticosteroid combination.  Discussed with Dr. Netty Starring.  PFTs scanned into chart.   Tobacco Counseling   Previously used tobacco.    Cigarettes      Year started smoking cigarettes:      1950     Number of years smoked:        55     Year quit smoking cigarettes:        2007     Number of years quit smoking cigarettes:    2     Pack Years:            0  Cessation Stage:     Maintenance Quit Confidence:     somewhat worried about restarting smoking  Quit Method Used:      -  cold [place]     -  quit smoking classes  Comments: Changed to a lower nicotine content cigarette, then cold [place].  Also attended a [place] group smoking cessation course.  Potential triggers for relapse: cravings after meals, but no other smokers in home.  Previous Quit Attempts   Previously Tried to quit:     Yes # of Previous quit attempts:     2 Longest Successful Quit Period:   2 years Reason for restarting:      -  enjoys smoking  Comments: Quit >12 months.  Still thinks smoking is enjoyable.

## 2010-04-08 NOTE — Progress Notes (Signed)
Summary: verify rx  Phone Note From Pharmacy Call back at [phone]   Reason for Call: Talk to Nurse Caller: kristy/[facility] Summary of Call: needs to verify rx, pt there now waiting, this pt will be a new pt on Monday....Dr. Dorathy Daft wrote the rx Initial call taken by: ERIN LEVAN,  January 12, 2007 4:45 PM  Follow-up for Phone Call        [facility] pharmacy.  It was actually Dr Netty Starring that wrote the prescription.  will forward to him. Follow-up by: Druscilla Brownie MD,  January 12, 2007 6:12 PM  Additional Follow-up for Phone Call Additional follow up Details #1::        Marita [place] from [facility] calling back this morning - she needs clarification on this rx.   Advised will send note to Dr. Jeannine Kitten as he is seeing pt today.  Megace ES 625 mg 1 q day #30 was how the rx was written. This med only comes as liquid, kristy needs to know if rx is one teaspoon daily for one month?  or how she should dispense.   Additional Follow-up by: AMY MARTIN RN,  January 15, 2007 10:06 AM    Additional Follow-up for Phone Call Additional follow up Details #2::    I called in to clarify with pharmacy.  This is 650 mg/5 mL, so patient should take 5 mL (1 tsp) daily.  Disp QS x 1 month (150 mL).  6 refills authorized.  I printed but discarded prescription.  New/Updated Medications: MEGACE ES 625 MG/5ML  SUSP (MEGESTROL ACETATE) 625 mg by mouth daily Disp QS 1 month   Prescriptions: MEGACE ES 625 MG/5ML  SUSP (MEGESTROL ACETATE) 625 mg by mouth daily Disp QS 1 month  #1 x 6   Entered and Authorized by:   Graciella Belton MD   Signed by:   Graciella Belton MD on 01/15/2007   Method used:   Print then Give to Patient   [id number]

## 2010-04-08 NOTE — Assessment & Plan Note (Signed)
Summary: f/u L hamstring pain   Vital Signs:  Patient profile:   77 year old male Height:      69 inches Weight:      167.13 pounds BMI:     24.77 Temp:     97.5 degrees F oral Pulse rate:   94 / minute Pulse rhythm:   regular BP sitting:   136 / 79  (right arm)  Vitals Entered By: Janeth Rase LPN (July 30, 624THL 624THL AM) CC: f/u left leg pain Is Patient Diabetic? No Pain Assessment Patient in pain? yes     Location: Left leg Intensity: 5 Type: aching Onset of pain  2-3 months   Primary Care Provider:  [name], MD  CC:  f/u left leg pain.  History of Present Illness: 77yo M f/u of left leg pain.  Left leg pain: Localized to left hamstring.  Reports some improvement but still present especially with exertion and use of hamstring.  He did not go see physical therapy and did not try heat w/ massage.  He tried compression and medications with some relief.  Pt was notified of xray results.  Allergies: No Known Drug Allergies  Review of Systems       No numbness or radiating pain.  No incontinence or back pain associated.    Physical Exam  General:  VS reviewed.  Well appearing, NAD Msk:  Patient in prone position and still has 4/5 strength of left hamstring compared to 5/5 strength of right Pain illicited with resistance of flexion of left knee and lateral straight leg raise   Impression & Recommendations:  Problem # 1:  MUSCLE STRAIN, HAMSTRING MUSCLE (ICD-844.8) Assessment Improved Mild improvement w/ time but not complete improvement.  Will send him back for physical therapy which he never started.  Continue with compression for support and apply heat/massage.  Ibuprofen as needed.  F/U in 8 weeks.   Orders: [place]- Est Level  3 [id number])  Complete Medication List: 1)  Ferrous Sulfate 324 Mg Tabs (Ferrous sulfate) .... One tablet by mouth two times a day for anemia 2)  Proventil Hfa 108 (90 Base) Mcg/act Aers (Albuterol sulfate) .... 2 puffs inh q  4h as needed wheezing 3)  Advair Diskus 250-50 Mcg/dose Misc (Fluticasone-salmeterol) .Marland Kitchen.. 1 puff in the morning and in the evening 4)  Allopurinol 300 Mg Tabs (Allopurinol) .... One tablet by mouth daily  Other Orders: Physical Therapy Referral (PT)  Patient Instructions: 1)  Please schedule a follow-up appointment in 2 months.  2)  Same treatment...compression, heat/massage, and physical therapy. 3)  ibuprofen as needed for pain and swelling Prescriptions: ADVAIR DISKUS 250-50 MCG/DOSE  MISC (FLUTICASONE-SALMETEROL) 1 puff in the morning and in the evening  #1 x 2   Entered and Authorized by:   [name]  MD   Signed by:   [name]  MD on 10/03/2008   Method used:   Electronically to        [facility] [id number]* (retail)       [address]       [place], [place]  [address]       Ph: [phone] or [phone]       Fax: [phone]   RxID:   [id number]

## 2010-04-08 NOTE — Assessment & Plan Note (Signed)
Summary: f/u visit/bmc   Vital Signs:  Patient Profile:   77 Years Old Male Weight:      142.7 pounds Temp:     97.6 degrees F Pulse rate:   79 / minute BP sitting:   124 / 76  (left arm)  Pt. in pain?   yes    Location:   rt rib area    Intensity:   2    Type:       pulling sensation  Vitals Entered By: Marcell Barlow RN (February 15, 2007 1:41 PM)              Is Patient Diabetic? No     Serial Vital Signs/Assessments:  Comments: Peak flow assessment  1. 150 2. 60 3. 140  ..................................................................Marland KitchenAMY MARTIN RN  February 15, 2007 2:38 PM  By: Arnette Schaumann RN    PCP:  [name], MD   History of Present Illness: [name] is a 77yo male w/ a percutaneous cholecystostomy drain and recent hospitalization in Nov. for acute gouty flare that was initially scheduled for cholecystectomy but had the procedure delayed due to his anemia and unstable condition.    He is here today s/p his upper GI endoscopy per [facility] and to evaluate his status in preparation for surgery.  His endoscopy conveyed nl esophagus, gastric diverticulum, gastric mucosal abnormality characterized by erythema, and nl duodenum.    His wife reports that his appetite is great.  He is no longer losing weight.  He reports no hematemesis, no rectal bleeding, no dizziness, no headache, no vision changes, no dyspnea, no abd pain, no N/V, and no further gouty episodes.    His drain is functioning well with his wife performing the flushes.  He is ready for the drain to be removed.       Past Medical History:    1. Gout    2. h/o cholecystitis    3. Fe def anemia   Social History:    He is married and lives with his wife and daughter.     Risk Factors:  Tobacco use:  quit    Physical Exam  General:     Thin, well-developed, cooperative, black male in better than form than when he was in the hospital in NAD.   Eyes:     EOMI. PERRLA.  Lungs:   Normal respiratory effort, chest expands symmetrically. Mild expiratory wheezes. No crackles. Heart:     Normal rate and regular rhythm. S1 and S2 normal without gallop, murmur, click, rub or other extra sounds. Abdomen:     Cholecystostomy tube in place with good drainage, no signs of erythema, edema, or tenderness on palpation Msk:     knees without swelling, redness, or tenderness Neurologic:     Increase in strength from previous weeks Skin:     Intact without suspicious lesions or rashes    Impression & Recommendations:  Problem # 1:  CHOLECYSTITIS, NOS (ICD-575.10) Currently stable and asymptomatic; His surgeon, Dr. Brantley Stage wants him hemodynamically stable and without and acute issues before he is willing to perform a cholecystectomy.  He states that the percutaneous tube can remain for months.  I will check his CBC today to evaluate his  blood counts to make sure his Hgb is stable.    Problem # 2:  ASTHMA (ICD-493.90) He is currently having mild expiratory wheezes.  His peak flow is poor.  Plan is to plug him into Dr. Birder Robson clinic to evaluate pulmonary function test  to evaluate his respiratory issues.Marland KitchenMarland KitchenCOPD vs. Asthma vs. Emphysema.  Will contine his current meds for now.     His updated medication list for this problem includes:    Proventil Hfa 108 (90 Base) Mcg/act Aers (Albuterol sulfate) .Marland Kitchen... 2 puffs inh q 4h as needed wheezing    Asmanex 60 Metered Doses 220 Mcg/inh Aepb (Mometasone furoate) ..... One inh in the morning and one in the evening    Atrovent Hfa 17 Mcg/act Aers (Ipratropium bromide hfa) ..... One inh in the morning and one in the evening  Orders: [facility] [place] ([id number]) [place]- Est  Level 4 [id number])   Problem # 3:  CACHEXIA (ICD-799.4) Pt has gained weight and inc. his daily intake of food without any medical intervention.  Will assess his weight at each visit.   Orders: [place]- Est  Level 4 [id number])   Complete Medication List: 1)  Ferrous Sulfate 324 Mg  Tabs (Ferrous sulfate) .... One tablet by mouth two times a day for anemia 2)  Proventil Hfa 108 (90 Base) Mcg/act Aers (Albuterol sulfate) .... 2 puffs inh q 4h as needed wheezing 3)  Asmanex 60 Metered Doses 220 Mcg/inh Aepb (Mometasone furoate) .... One inh in the morning and one in the evening 4)  Atrovent Hfa 17 Mcg/act Aers (Ipratropium bromide hfa) .... One inh in the morning and one in the evening 5)  Colchicine 0.6 Mg Tabs (Colchicine) .... Take one tablet by mouth in the morning and one at night  Other Orders: [facility] [id number])  Future Orders: PFT Baseline-Pre/Post Bronchodiolator (PFT Baseline-Pre/Pos) ... 04/05/2007   Patient Instructions: 1)  Please schedule a follow-up appointment for spirometry in Rx clinic and with me in 2 weeks. 2)  I will contact your surgeon when I get the lab results to inform him of your status regarding your gall bladder removal procedure.   3)  Continue to improve as you are doing currently.   4)  When you come back in two weeks, we will test your lung function for COPD vs. Asthma.    Prescriptions: COLCHICINE 0.6 MG  TABS (COLCHICINE) take one tablet by mouth in the morning and one at night  #60 x 1   Entered and Authorized by:   [name]  MD   Signed by:   [name]  MD on 02/15/2007   Method used:   Historical   [id number] ATROVENT HFA 17 MCG/ACT  AERS (IPRATROPIUM BROMIDE HFA) one inh in the morning and one in the evening  #1 x 0   Entered and Authorized by:   [name]  MD   Signed by:   [name]  MD on 02/15/2007   Method used:   Historical   [id number] ASMANEX 60 METERED DOSES 220 MCG/INH  AEPB (MOMETASONE FUROATE) one inh in the morning and one in the evening  #1 x 0   Entered and Authorized by:   [name]  MD   Signed by:   [name]  MD on 02/15/2007   Method used:   Historical   [id number] FERROUS SULFATE 324 MG  TABS (FERROUS SULFATE) one  tablet by mouth two times a day for anemia  #60 x 1   Entered and Authorized by:   [name]  MD   Signed by:   [name]  MD on 02/15/2007   Method used:   Historical   [id number]  ]appointment scheduled with Dr. Valentina Lucks  for PFT's 03/20/07. follow up appointment scheduled with Dr. Netty Starring  at 11:00 the same day. ..................................................................Marland KitchenMarcell Barlow RN  February 15, 2007 2:55 PM   Vital Signs:  Patient Profile:   77 Years Old Male Weight:      142.7 pounds Temp:     97.6 degrees F Pulse rate:   79 / minute BP sitting:   124 / 76    Location:   rt rib area    Intensity:   2    Type:       pulling sensation

## 2010-04-08 NOTE — Letter (Signed)
Summary: Blood [place]  Hardcopy in MD box.

## 2010-04-08 NOTE — Assessment & Plan Note (Signed)
Summary: routine health physical   Vital Signs:  Patient Profile:   77 Years Old Male Height:     69 inches Weight:      170.1 pounds BMI:     25.21 Temp:     97.7 degrees F oral Pulse rate:   98 / minute BP sitting:   146 / 88  (left arm) Cuff size:   regular  Pt. in pain?   no  Vitals Entered By: [place], (April 29, 2008 2:27 PM)                  PCP:  [name], MD  Chief Complaint:  routine physical.  History of Present Illness: 77yo M here for routine physical.  Health: No recent hospitalizations.  No respiratory problems.  Gout has been controlled on medications.  No chest pain, no SOB, no falls, no dizziness, no GI problems.  Urinary frequency: New symptom.  Worse at night.  No hematuria.  No dysuria.  No change in stream.  No problems starting or stopping.    Marital issues: Pt unhappy with marriage.  Complaining about his wife and thinking about moving away.    Tobacco use: Pt had formerly quit but is smoking cigars now.        Current Allergies: No known allergies   Past Medical History:    Acute cholecystitis - s/p cholecystostomy tube 9/08    Gout    COPD    HTN    Hx of Iron Deficiency Anemia    Tobacco use  Past Surgical History:    Reviewed history from 04/12/2007 and no changes required:       Laproscopic cholecystectomy w/ cholangiogram 04/04/07   Family History:    none  Social History:    He is married and lives with his wife and daughter.      Retired.    Smoking cigars.    No EtOH.    No reg exercise    Review of Systems      See HPI   Physical Exam  General:     Thin, pleasant AAM, NAD Eyes:     EOMI, PERRLA Mouth:     dentures Lungs:     Normal respiratory effort, chest expands symmetrically. Lungs are clear to auscultation, no crackles or wheezes. Heart:     Normal rate and regular rhythm. S1 and S2 normal without gallop, murmur, click, rub or other extra sounds. Abdomen:     Bowel  sounds positive,abdomen soft and non-tender without masses, organomegaly or hernias noted. Rectal:     Good sphincter tone; minimal stool in rectal vault; hemaoccult neg Prostate:     prostate is firm, not enlarged, not boggy, not tender Msk:     no atrophy Extremities:     no edema Neurologic:      5/5 strength Skin:     no abnl lesions Psych:     mood is concerning but proper affect; nl judgement and insight; hints of depression; good eye contact    Impression & Recommendations:  Problem # 1:  WELL ADULT EXAM (ICD-V70.0) Assessment: Improved Chronic medical issues adequately controlled.  No changes to medications.  Concern regarding his marriage.  He jokes about this at times and I don't think he is serious about leaving but I have offered to continue discussions with him anytime.  He states that he had a colonoscopy last year.  Will have the nursing staff call to confirm.  Will check  CBC and BMET today.  All meds reviewed and refilled as necessary.    Orders: [facility] - Est  65+ [phone])   Problem # 2:  URINARY FREQUENCY (ICD-788.41) Assessment: New Concern for possible BPH.  Not convince b/c of nl exam and lack of certain symptoms.  Will check a PSA today and UA.    Orders: PSA-[facility] [id number]) UA Glucose/Protein-[facility] (81002) [facility] - Est  65+ [phone])   Problem # 3:  TOBACCO USER (ICD-305.1) Assessment: New Started back on smoking cigars.  Smoking cessation provided.    Complete Medication List: 1)  Ferrous Sulfate 324 Mg Tabs (Ferrous sulfate) .... One tablet by mouth two times a day for anemia 2)  Proventil Hfa 108 (90 Base) Mcg/act Aers (Albuterol sulfate) .... 2 puffs inh q 4h as needed wheezing 3)  Colchicine 0.6 Mg Tabs (Colchicine) .... Take one tablet by mouth every hour when acute gouty flare occurs until diarrhea or symptoms improve 4)  Advair Diskus 250-50 Mcg/dose Misc (Fluticasone-salmeterol) .Marland Kitchen.. 1 puff in the morning and in the evening 5)  Allopurinol 300 Mg  Tabs (Allopurinol) .... One tablet by mouth daily  Other Orders: Basic Met-[facility] [id number]) CBC-[facility] [id number])   Patient Instructions: 1)  I will call you about the labs and regarding f/u.  Today I refilled your meds.   Prescriptions: PROVENTIL HFA 108 (90 BASE) MCG/ACT  AERS (ALBUTEROL SULFATE) 2 puffs inh q 4h as needed wheezing  #1 x 0   Entered and Authorized by:   [name]  MD   Signed by:   [name]  MD on 04/29/2008   Method used:   Electronically to        [facility]. [id number]* (retail)       [address].       [place], [place]  [address]       Ph: [phone] or [phone]       Fax: [phone]   RxID:   [id number] ALLOPURINOL 300 MG  TABS (ALLOPURINOL) one tablet by mouth daily  #90 x 1   Entered and Authorized by:   [name]  MD   Signed by:   [name]  MD on 04/29/2008   Method used:   Electronically to        [facility]. [id number]* (retail)       [address].       [place], [place]  [address]       Ph: [phone] or [phone]       Fax: [phone]   RxID:   [id number] ADVAIR DISKUS 250-50 MCG/DOSE  MISC (FLUTICASONE-SALMETEROL) 1 puff in the morning and in the evening  #1 x 1   Entered and Authorized by:   [name]  MD   Signed by:   [name]  MD on 04/29/2008   Method used:   Electronically to        [facility]. [id number]* (retail)       [address].       [place], [place]  [address]       Ph: [phone] or [phone]       Fax: [phone]   RxID:   [id number] FERROUS SULFATE 324 MG  TABS (FERROUS SULFATE) one tablet by mouth two times a day for anemia  #90 x 1   Entered and Authorized  by:   [name]  MD   Signed by:   [name]  MD on 04/29/2008   Method used:   Electronically to        [facility]. [id number]* (retail)       [address].       [place], [place]  [address]       Ph: [phone] or [phone]       Fax: [phone]   RxID:   [id number]   Herpes Zoster Result Date:  04/29/2008 Herpes Zoster Result:  refused Flex Sig Result Date:  04/29/2008 Flex Sig Result:  refused Hemoccult Result Date:  04/29/2008 Hemoccult Result:  normal PSA Result Date:  04/29/2008 PSA Result:  2.13 (nl)  Laboratory Results   Urine Tests  Date/Time Received: April 29, 2008 3:37 PM  Date/Time Reported: April 29, 2008 3:37 PM   Routine Urinalysis   Glucose: negative   (Normal Range: Negative) Protein: trace   (Normal Range: Negative)    Comments: ..........test performed by.....Marland KitchenMarland KitchenDeseree Blount (SMA)                 confirmed by...Marland KitchenMarland KitchenMarland Kitchen Bonnie A. [place], [place] (ASCP)

## 2010-04-08 NOTE — Miscellaneous (Signed)
Summary: Colonoscopy results  Clinical Lists Changes  Observations: Added new observation of COLONNXTDUE: 01/30/2017 (05/09/2008 11:22) Added new observation of LST COLON DT: 01/31/2007 (01/31/2007 11:23) Added new observation of COLONOSCOPY: normal (01/31/2007 11:23)     Colonoscopy Result Date:  01/31/2007 Colonoscopy Result:  normal Colonoscopy Next Due:  10 yr

## 2010-04-08 NOTE — Consult Note (Signed)
[facility]   Imported By: Drucie Ip 02/12/2007 14:23:32  _____________________________________________________________________  External Attachment:    Type:   Image     Comment:   External Document

## 2010-04-08 NOTE — Assessment & Plan Note (Signed)
Summary: meet new doc,df   Vital Signs:  Patient profile:   77 year old male Height:      69 inches Weight:      181.5 pounds BMI:     26.90 Temp:     98.4 degrees F oral Pulse rate:   99 / minute BP sitting:   156 / 93  (left arm) Cuff size:   regular  Vitals Entered By: Isabelle Course (September 10, 2009 3:10 PM)  Serial Vital Signs/Assessments:  Time      Position  BP       Pulse  Resp  Temp     By 3:31 PM             168/88                         Isabelle Course  Comments: 3:31 PM re checked manually By: Isabelle Course   CC: meet new doctor, refill meds Is Patient Diabetic? No Pain Assessment Patient in pain? no        Primary Provider:  Vickie Epley MD  CC:  meet new doctor and refill meds.  History of Present Illness: Mr. Weisinger is a 77 year old gentleman who presents today with questions about medications, prescription renewal request, and concerns about ED.  The patient was recently seen in clinic for left hip pain, which was determined to be from OA, and was treated with Tylenol 500mg  by mouth three times a day.  The patient had questions about the safety of continuing this medication long term.  He also requested a refil of his Aidvair.  Finally he wanted to discuss getting forms filled out to authorize him to get a PostVac, a device for ED.  He reports that, for the past year, he has had problems both with obtaining and keeping errections.  He reports no morning errections and that this has become a problem for him and his wife.  He is not interested in Viagra or Cialis due to cost concerns and the belief that his insurance will cover part of the device cost.  Allergies: No Known Drug Allergies  Review of Systems  The patient denies vision loss, decreased hearing, chest pain, and incontinence.    Physical Exam  General:  Well-developed,well-nourished,in no acute distress; alert,appropriate and cooperative throughout examination Head:  Normocephalic and  atraumatic without obvious abnormalities. No apparent alopecia or balding. Eyes:  vision grossly intact, corneas and lenses clear, and no injection.   Neck:  No deformities, masses, or tenderness noted. Lungs:  Normal respiratory effort, chest expands symmetrically. Lungs are clear to auscultation, no crackles or wheezes.R decreased breath sounds and L decreased breath sounds.   Heart:  Normal rate and regular rhythm. S1 and S2 normal without gallop, murmur, click, rub or other extra sounds. Abdomen:  Bowel sounds positive,abdomen soft and non-tender without masses, organomegaly or hernias noted. Skin:  Intact without suspicious lesions or rashes   Impression & Recommendations:  Problem # 1:  ERECTILE DYSFUNCTION (ICD-302.72)  Discussed with patient the fact that often insurance does not cover such devices and advised him that he should consider the differences in cost between device and ED drugs.  Also offered to give the patient a prescription for a single Viagra for him to try.  The patient was not interested in this and was told that we would be happy to fill out any necessary forms for him to obtain the device.  He was instructed to return in September to further discuss this problem.  Orders: [facility]- Est Level  3 [id number])  Problem # 2:  HIP PAIN, LEFT (ICD-719.45)  The patient was advised that there were no long term health complications with continuing his current dose of tylenol indefinitely.  His updated medication list for this problem includes:    Tylenol Extra Strength 500 Mg Tabs (Acetaminophen) .Marland Kitchen... 2 tabs by mouth three times a day scheduled  Orders: [facility]- Est Level  3 [id number])  Problem # 3:  ASTHMA (ICD-493.90) The patients Advair prescription was renewed.  His updated medication list for this problem includes:    Proventil Hfa 108 (90 Base) Mcg/act Aers (Albuterol sulfate) .Marland Kitchen... 2 puffs inh q 4h as needed wheezing    Advair Diskus 250-50 Mcg/dose Misc  (Fluticasone-salmeterol) .Marland Kitchen... 1 puff in the morning and in the evening  Problem # 4:  Preventive Health Care (ICD-V70.0) The patients elevated blood pressure was discussed.  He was not interested in begining any type of therapy for this as he felt it was due to his visiting grandchildren.  We agreed that he would return in September to have a blood pressure check.  Complete Medication List: 1)  Proventil Hfa 108 (90 Base) Mcg/act Aers (Albuterol sulfate) .... 2 puffs inh q 4h as needed wheezing 2)  Advair Diskus 250-50 Mcg/dose Misc (Fluticasone-salmeterol) .Marland Kitchen.. 1 puff in the morning and in the evening 3)  Allopurinol 300 Mg Tabs (Allopurinol) .... One tablet by mouth daily 4)  Tylenol Extra Strength 500 Mg Tabs (Acetaminophen) .... 2 tabs by mouth three times a day scheduled  Patient Instructions: 1)  It was a pleasure seeing you today. 2)  Please schedule a follow-up appointment in 2 months. 3)  I would be happy to fill out the necessary forms for you to obtain a PostVac device.  We have not received any forms, so please contact our front office to determine the correct fax number. Prescriptions: ADVAIR DISKUS 250-50 MCG/DOSE  MISC (FLUTICASONE-SALMETEROL) 1 puff in the morning and in the evening  #1 x 5   Entered and Authorized by:   Vickie Epley MD   Signed by:   Vickie Epley MD on 09/10/2009   Method used:   Electronically to        [facility]. [id number]* (retail)       [address].       [place], [place]  [address]       Ph: [phone] or [phone]       Fax: [phone]   RxID:   [id number]

## 2010-04-08 NOTE — Letter (Signed)
Summary: Operative Report & Discharge Summary  Operative Report & Discharge Summary   Imported By: Drucie Ip 04/12/2007 14:10:18  _____________________________________________________________________  External Attachment:    Type:   Image     Comment:   External Document

## 2010-04-08 NOTE — Procedures (Signed)
Summary: Gastroenterology  Gastroenterology   Imported By: Audie Clear 06/03/2008 10:28:27  _____________________________________________________________________  External Attachment:    Type:   Image     Comment:   External Document  Appended Document: Gastroenterology Reviewed

## 2010-04-08 NOTE — Miscellaneous (Signed)
Summary: refill for allopurinol 300mg  daily  Clinical Lists Changes  Medications: Changed medication from ALLOPURINOL 300 MG  TABS (ALLOPURINOL) to ALLOPURINOL 300 MG  TABS (ALLOPURINOL) one tablet by mouth daily - Signed Rx of ALLOPURINOL 300 MG  TABS (ALLOPURINOL) one tablet by mouth daily;  #90 x 3;  Signed;  Entered by: [name]  MD;  Authorized by: [name]  MD;  Method used: Electronically to [facility]. #[id number]*, [address] [place], [place], [place]  [address], Ph: [phone] or [phone], Fax: [phone]    Prescriptions: ALLOPURINOL 300 MG  TABS (ALLOPURINOL) one tablet by mouth daily  #90 x 3   Entered and Authorized by:   [name]  MD   Signed by:   [name]  MD on 01/24/2008   Method used:   Electronically to        [facility]. [id number]* (retail)       [address].       [place], [place]  [address]       Ph: [phone] or [phone]       Fax: [phone]   RxID:   [id number]

## 2010-04-08 NOTE — Assessment & Plan Note (Signed)
Summary: 77yo M wellness visit   Vital Signs:  Patient profile:   77 year old male Height:      69 inches Weight:      181 pounds BMI:     26.83 Temp:     98.1 degrees F oral Pulse rate:   104 / minute BP sitting:   131 / 83  (right arm) Cuff size:   regular  Vitals Entered By: Schuyler Amor CMA (July 01, 2009 2:57 PM) CC: complete physical Is Patient Diabetic? No Pain Assessment Patient in pain? yes     Location: left hip, groin Intensity: 6   Primary Care Provider:  [name], MD  CC:  complete physical.  History of Present Illness: 77yo M here for complete physical  Concerns: L hip pain and low back pain x 1 month.  Acute onset.  Pain in left hip radiates into the groin.  Denies any event or fall to trigge the discomfort.  Denies any fevers, chills, wt loss, night sweats, or night time awakenings due to pain.  No hematuria, urinary changes, or melena/bloody stools.  Denies any joint swelling or redness.  Preventative: Up to date on colonoscopy (last nl 2008).  Previous PSA was nl in 04/2008.      Habits & Providers  Alcohol-Tobacco-Diet     Tobacco Status: current     Tobacco Counseling: to quit use of tobacco products     Other Tobacco cigar     Other per week 7  Current Medications (verified): 1)  Proventil Hfa 108 (90 Base) Mcg/act  Aers (Albuterol Sulfate) .... 2 Puffs Inh Q 4h As Needed Wheezing 2)  Advair Diskus 250-50 Mcg/dose  Misc (Fluticasone-Salmeterol) .Marland Kitchen.. 1 Puff in The Morning and in The Evening 3)  Allopurinol 300 Mg  Tabs (Allopurinol) .... One Tablet By Mouth Daily 4)  Tylenol Extra Strength 500 Mg Tabs (Acetaminophen) .... 2 Tabs By Mouth Three Times A Day Scheduled  Allergies (verified): No Known Drug Allergies  Past History:  Past Medical History: Acute cholecystitis - s/p cholecystostomy tube 9/08 Gout COPD Hx of Iron Deficiency Anemia Tobacco use  Past Surgical History: Reviewed history from 04/12/2007 and no changes  required. Laproscopic cholecystectomy w/ cholangiogram 04/04/07  Family History: Reviewed history from 04/29/2008 and no changes required. none  Social History: Reviewed history from 04/29/2008 and no changes required. He is married and lives with his wife and daughter.   Retired. Smoking cigars. No EtOH. No reg exercise  Review of Systems      See HPI  Physical Exam  General:  VS Reviewed. Well appearing, NAD.  Mouth:  no oral lesions Neck:  supple, full ROM, no goiter or mass  Lungs:  Normal respiratory effort, chest expands symmetrically. Lungs are clear to auscultation, no crackles or wheezes. Heart:  Normal rate and regular rhythm. S1 and S2 normal without gallop, murmur, click, rub or other extra sounds. No abd aortic bruits Abdomen:  Soft, NT, ND, no HSM, active BS  Prostate:  declined prostate exam Msk:  no joint effusions or erythema Both hips w/ positive log roll and discomfort w/ internal and external rotation of femur head;  Extremities:  no edema Neurologic:  no neurological deficits Cervical Nodes:  No lymphadenopathy noted Axillary Nodes:  No palpable lymphadenopathy   Impression & Recommendations:  Problem # 1:  WELL ADULT EXAM (ICD-V70.0) Assessment Deteriorated  Acutely c/o L hip pain and low back pain x 1 month. Cannot r/o lytic lesions of the bone  given his age therefore will obtain films of both hip and lumbar spine- no red flags on hx and exam. More likely c/w OA therefore will start on Tylenol 1000mg  three times a day scheduled.  Will go ahead and check CMET to evaluate renal and hepatic function. Will check CBC for hx of anemia and c/o chronic malaise. Up to date on colonoscopy (last nl 2008).  Previous PSA was nl in 04/2008-> refused prostate screening.  Plan to call him regarding xray results and will decide upon f/u accordingly.  Orders: [facility] - Est  65+ [phone])  Complete Medication List: 1)  Proventil Hfa 108 (90 Base) Mcg/act Aers  (Albuterol sulfate) .... 2 puffs inh q 4h as needed wheezing 2)  Advair Diskus 250-50 Mcg/dose Misc (Fluticasone-salmeterol) .Marland Kitchen.. 1 puff in the morning and in the evening 3)  Allopurinol 300 Mg Tabs (Allopurinol) .... One tablet by mouth daily 4)  Tylenol Extra Strength 500 Mg Tabs (Acetaminophen) .... 2 tabs by mouth three times a day scheduled  Other Orders: Comp Met-[facility] [id number]) Radiology other (Radiology Other) CBC w/Diff-[facility] [phone])  Patient Instructions: 1)  I want you to go get an xray today and I'll call you with the results tomorrow. 2)  Start taking the scheduled tylenol and call me in 1-2 weeks if no improvement.

## 2010-04-08 NOTE — Assessment & Plan Note (Signed)
Summary: back pain,df   Vital Signs:  Patient profile:   77 year old male Weight:      180 pounds Temp:     97.6 degrees F oral Pulse rate:   100 / minute Pulse rhythm:   regular BP sitting:   121 / 83  (left arm) Cuff size:   regular  Vitals Entered By: Audelia Hives CMA (January 11, 2010 10:29 AM) CC: back pain Pain Assessment Patient in pain? yes     Location: lower back Intensity: 9 Type: aching Onset of pain  With activity Comments pain started on thursday. has had back pain in past but not like this. took tylenol arthritis for pain but not effective. also he would like a refill for his inhaler    Primary Care Provider:  Vickie Epley MD  CC:  back pain.  History of Present Illness: BACK PAIN Location: Lower lumbar, upper sacral, midline. Onset: 5d ago Description: painful. Modifying factors: Went on long walk Monday and tuesday of last week, pain started wed, worse thursday.  Symptoms Worse with: All movements. Better with: Being still. Trauma: no  Red Flags Fecal/urinary incontinence: NO Weakness: NO Fever/chills: NO Night pain: NO Unexplained weight loss: NO No relief with bedrest: NO Cancer/immunosuppression: NO IV drug use: NO PMH of osteoporosis or chronic steroid use: NO  Also wants flu shot.  Also with L hip pain.  Has had XR done showing severe DJD femoroacetabular joint.    Current Medications (verified): 1)  Proventil Hfa 108 (90 Base) Mcg/act  Aers (Albuterol Sulfate) .... 2 Puffs Inh Q 4h As Needed Wheezing 2)  Advair Diskus 250-50 Mcg/dose  Misc (Fluticasone-Salmeterol) .Marland Kitchen.. 1 Puff in The Morning and in The Evening 3)  Allopurinol 300 Mg  Tabs (Allopurinol) .... One Tablet By Mouth Daily 4)  Tramadol Hcl 50 Mg Tabs (Tramadol Hcl) .... One Tab Po Q6h As Needed For Pain.  Allergies (verified): No Known Drug Allergies  Review of Systems       See HPI  Physical Exam  General:  Well-developed,well-nourished,in no acute distress;  alert,appropriate and cooperative throughout examination Msk:  Back Exam: Inspection: Normal Motion: Flexion and ext limited by pain. SLR lying: Stretch only  XSLR lying: Stretch only Palpable tenderness: No FABER:Negative. Sensory change:NO Reflex change:NO  Strength at foot Plantar-flexion:  5/ 5    Dorsi-flexion:  5/ 5    Eversion:  5/ 5   Inversion:  5/ 5 Leg strength Quad:  5/ 5   Hamstring:  5/ 5   Hip flexor:  5/ 5   Hip abductors:  5/ 5 Gait:  Normal. Walking:          Heels:  Normal         Toes:  Normal         Tandem: Normal  LEFT Hip: ROM IR: 10 Deg, ER: 300 Deg, Flexion: 120 Deg, Extension: 100 Deg, Abduction: 45 Deg, Adduction: 45 Deg Strength IR: 5/5, ER: 5/5, Flexion: 5/5, Extension: 5/5, Abduction: 5/5, Adduction: 5/5 Pelvic alignment unremarkable to inspection and palpation. Standing hip rotation and gait without trendelenburg / unsteadiness. Greater trochanter without tenderness to palpation. No tenderness over piriformis and greater trochanter. No SI joint tenderness and normal minimal SI movement.    Impression & Recommendations:  Problem # 1:  LOW BACK PAIN, ACUTE (ICD-724.2) Assessment New Likely strain vs DOMS associated with increased activity the days prior to onset of pain. No symptoms or signs cord compression/cauda equina. Has had XR  in the past showing mild DJD spine. Has been trying aspirin, increasing this to tramadol. LBP rehab exercises given from sports med advisor book. Pt reminded, needs to stay active instead of sedentary as he is now.  His updated medication list for this problem includes:    Tramadol Hcl 50 Mg Tabs (Tramadol hcl) ..... One tab po q6h as needed for pain.  Orders: [place]- Est  Level 4 [id number])  Problem # 2:  HIP PAIN, LEFT (ICD-719.45) Assessment: Deteriorated This may benefit from femoroacetabular steroid injection. Improving this pay may help him stay more active and improve his back pain. Advised that he should  make an appt with the [facility] to discuss US guided injection.  His updated medication list for this problem includes:    Tramadol Hcl 50 Mg Tabs (Tramadol hcl) ..... One tab po q6h as needed for pain.  Orders: [place]- Est  Level 4 [id number])  Problem # 3:  NEED PROPHYLACTIC VACCINATION&INOCULATION FLU (ICD-V04.81) Assessment: Comment Only Flu shot given to pt and his wife.  Complete Medication List: 1)  Proventil Hfa 108 (90 Base) Mcg/act Aers (Albuterol sulfate) .... 2 puffs inh q 4h as needed wheezing 2)  Advair Diskus 250-50 Mcg/dose Misc (Fluticasone-salmeterol) .Marland Kitchen.. 1 puff in the morning and in the evening 3)  Allopurinol 300 Mg Tabs (Allopurinol) .... One tablet by mouth daily 4)  Tramadol Hcl 50 Mg Tabs (Tramadol hcl) .... One tab po q6h as needed for pain. Prescriptions: TRAMADOL HCL 50 MG TABS (TRAMADOL HCL) One tab Po q6h as needed for pain.  #60 x 0   Entered and Authorized by:   Aundria Mems MD   Signed by:   Aundria Mems MD on 01/11/2010   Method used:   Print then Give to Patient   RxID:   [id number]    Orders Added: 1)  [place]- Est  Level 4 [id number]   Flu Vaccine Consent Questions:    Do you have a history of severe allergic reactions to this vaccine? no    Any prior history of allergic reactions to egg and/or gelatin? no    Do you have a sensitivity to the preservative Thimersol? no    Do you have a past history of Guillan-Barre Syndrome? no    Do you currently have an acute febrile illness? no    Have you ever had a severe reaction to latex? no    Vaccine information given and explained to patient? yes   Influenza Vaccine (to be given today)

## 2010-04-08 NOTE — Letter (Signed)
Summary: Handout Printed  Printed Handout:  - Back Pain & Injury

## 2010-04-08 NOTE — Assessment & Plan Note (Signed)
Summary: cold with stomach pain   Vital Signs:  Patient Profile:   77 Years Old Male Weight:      144.4 pounds Temp:     97.8 degrees F Pulse rate:   103 / minute BP sitting:   123 / 68  (left arm)  Pt. in pain?   no  Vitals Entered By: Geanie Cooley RN (February 20, 2007 1:45 PM)                  Chief Complaint:  chills, fever, and HA.  History of Present Illness: 77 year old male here with chills, sweats.  Patient was initially seen in hospital  ~2 months ago with acute cholecystitis but was too ill to have cholecystectomy at the time.  He had a percutaneous cholecystostomy tube placed - has been draining to gravity without problems until the past 48 hours.  Wife has noticed significantly decreased output and no help with flushing.  He has had paroxysmal RUQ abdominal pain but he feels it's different than it was his first admission.  He has felt more tired, had sweats and chills over the past day.  He is able to keep fluids down without difficulty - denies vomiting.  Denies cough, chest pain, shortness of breath.  Denies rash (has h/o gout).  Had contact with people at church who were ill (a child with a cold).  Current Allergies (reviewed today): No known allergies  Updated/Current Medications (including changes made in today's visit):  FERROUS SULFATE 324 MG  TABS (FERROUS SULFATE) one tablet by mouth two times a day for anemia PROVENTIL HFA 108 (90 BASE) MCG/ACT  AERS (ALBUTEROL SULFATE) 2 puffs inh q 4h as needed wheezing ASMANEX 60 METERED DOSES 220 MCG/INH  AEPB (MOMETASONE FUROATE) one inh in the morning and one in the evening ATROVENT HFA 17 MCG/ACT  AERS (IPRATROPIUM BROMIDE HFA) one inh in the morning and one in the evening COLCHICINE 0.6 MG  TABS (COLCHICINE) take one tablet by mouth in the morning and one at night   Past Medical History:    Acute cholecystitis - s/p cholecystostomy tube 9/08    Gout    COPD    HTN    Iron Deficiency Anemia       Physical Exam  General:     Gen: Well-developed, well-nourished, NAD, alert and cooperative HEENT: NCAT, EOMI, no conjunctival inflammation or scleral icterus, pharynx nl without erythema or exudates, MMM. External ear exam nl - TMs without bulging or erythema.  No nasal d/c Neck: No LAN, thyromegaly, masses CV: RRR, could not assess for MRGs - distant Lungs: Difficult to hear breath sounds - Nl resp effort. no wheezes, rales, rhonchi auscultated Abd: soft, nd.  Tender greatest at RUQ with + Murphy's sign.  BS normoactive.  No HSM.  No masses.  Cholecystostomy tube visualized - bile throughout tubing and very little in bag - appears to be clogged at entrance to bag. Ext: warm, well perfused.  No edema or cyanosis Skin: No rashes or lesions on visible skin     Impression & Recommendations:  Problem # 1:  RUQ PAIN (ICD-789.01) Assessment: Deteriorated Also has sweats, chills.  Afebrile here.  Noted his WBC count was slightly elevated when CBC was checked a few days ago.  Discussed case with Dr. Nori Riis - believe patient should be transferred to ED for further workup by them to possibly include RUQ u/s, cbc with diff, cmp, and wound care versus IR to evaluate drain to  ensure it is working properly.   Orders: [place]- Est Level  3 [id number])   Complete Medication List: 1)  Ferrous Sulfate 324 Mg Tabs (Ferrous sulfate) .... One tablet by mouth two times a day for anemia 2)  Proventil Hfa 108 (90 Base) Mcg/act Aers (Albuterol sulfate) .... 2 puffs inh q 4h as needed wheezing 3)  Asmanex 60 Metered Doses 220 Mcg/inh Aepb (Mometasone furoate) .... One inh in the morning and one in the evening 4)  Atrovent Hfa 17 Mcg/act Aers (Ipratropium bromide hfa) .... One inh in the morning and one in the evening 5)  Colchicine 0.6 Mg Tabs (Colchicine) .... Take one tablet by mouth in the morning and one at night   Patient Instructions: 1)  Go to emergency department. 2)  I will call ahead and let them know you  are coming.  Give them the letter I typed up so they are informed.    ]

## 2010-04-08 NOTE — Progress Notes (Signed)
Summary: Refill  Phone Note Call from Patient Call back at Home Phone [phone]   Caller: Wife Summary of Call: Pt needs refill on gout medication, coltracene.  [facility] [address] Initial call taken by: Drucie Ip,  March 16, 2007 11:18 AM      Prescriptions: COLCHICINE 0.6 MG  TABS (COLCHICINE) take one tablet by mouth in the morning and one at night  #60 x 1   Entered by:   Mauricia Area CMA,   Authorized by:   [name]  MD   Signed by:   Mauricia Area CMA, on 03/16/2007   Method used:   Electronically [facility] to ...       [facility]  [address] [id number]       [address]       [place], [place]  [address]       Ph: [phone] or [phone]       Fax: [phone]   RxID:   [id number]

## 2010-04-08 NOTE — Miscellaneous (Signed)
  Clinical Lists Changes  Problems: Added new problem of ORGANIC IMPOTENCE [id number])

## 2010-04-08 NOTE — Miscellaneous (Signed)
Summary: pt declined PT  Clinical Lists Changes rec'd [facility]. pt declines PT at this time. "says he will see MD in 4-6 weeks for f/u".Elige Radon RN  September 17, 2008 12:07 PM

## 2010-04-08 NOTE — Procedures (Signed)
Summary: Upper GI Endoscopy  [facility] Anson Fret, MD)  Impression: * Normal esophagus * Gastric diverticulum * Gastric mucosal abnormality characterized by erythema.  Biopsied. * Normal examined duodenum.  Recommendations: * Check Clotest  Clotest results:  NEGATIVE

## 2010-04-08 NOTE — Assessment & Plan Note (Signed)
Summary: new pt/hospital fu/per linthavong/el   Vital Signs:  Patient Profile:   77 Years Old Male Weight:      144 pounds Pulse rate:   123 / minute BP sitting:   140 / 86  Vitals Entered By: [place] (January 15, 2007 4:00 PM)                 Chief Complaint:  HOSPITAL FU.  History of Present Illness: 77 yo male seen in followup from hospitalization.  His PCP is Dr. Netty Starring.  1)  HTN.  Was hypotensive in hospital, so his BP meds were held.    2) Cholecystitis.  No RUQ pain.  Cholecystostomy tube in place without complications.  3) Gout.  No pain.  Taking colchicine as prescribed.  4) Asthma/COPD.  No complaints of SOB.  5) Anemia.  Continues to take iron supplement.  No complaint of weakness, light-headedness.  6) Malnutrition.  Had trouble filling Megace--it is very expensive, so they didn't fill it.  Is eating and drinking very well per him and his wife.  He still doesn't have a big appetite, but is trying to keep eating and get strong.  I reminded him that this was important to get strong for surgery.        Physical Exam  General:     Well-developed,well-nourished,in no acute distress; alert,appropriate and cooperative throughout examination Lungs:     Normal respiratory effort, chest expands symmetrically. Lungs are clear to auscultation, no crackles or wheezes. Heart:     Normal rate and regular rhythm. S1 and S2 normal without gallop, murmur, click, rub or other extra sounds.  **Very distant sounds. Abdomen:     Bowel sounds positive,abdomen soft and non-tender without masses, organomegaly or hernias noted.  Cholecystostomy tube is in place--dressing is c/d/i.    Impression & Recommendations:  Problem # 1:  HYPERTENSION (ICD-401.1) Today his BP is moderately high, but will not urgently add back his medications, which were held during hospitalization.  He will followup in 1 month with Dr. Netty Starring for further evaluation.  Problem # 2:   CHOLECYSTITIS, NOS (ICD-575.10) Stable.  Advised to followup with surgeon, Dr. Brantley Stage.  Problem # 3:  GOUT, UNSPECIFIED (ICD-274.9) No pain.  Well-controlled on colchicine.  Gave prescription for Percocet (12 tabs) in case of acute flair.  Problem # 4:  ASTHMA (ICD-493.90) Continue flovent/atrovent.  Problem # 5:  ANEMIA, IRON DEF, NOS (ICD-280.9) Continue iron supplement.  Followup with GI.  Problem # 6:  CACHEXIA (ICD-799.4) Has Megace prescription if needed, but is eating much better--feels much stronger.  Complete Medication List: 1)  Megace Es 625 Mg/17ml Susp (Megestrol acetate) .... 625 mg by mouth daily disp qs 1 month   Patient Instructions: 1)  We have scheduled a GI appointment for you--see attached sheet. 2)  I don't have a cheaper alternative to the Megace for you, but it looks like you've picked up your eating on your own.  Please keep up with that, and the Megace is available at the pharmacy if you feel you need it for the short term. 3)  Please follow up soon with your surgeon regarding your gall bladder. 4)  I am happy that the colchicine is preventing gout flare-ups.  I've given you a prescription for 12 percocet tablets in case you need them. 5)  Please schedule a follow-up appointment with Dr. Netty Starring  in 1 month.    ]

## 2010-04-08 NOTE — Assessment & Plan Note (Signed)
Summary: pain backs of thighs,worse with walking/[place]   Vital Signs:  Patient profile:   77 year old male Weight:      168.8 pounds Temp:     97.7 degrees F oral Pulse rate:   118 / minute BP sitting:   126 / 82  (left arm)  Vitals Entered By: Carin Hock MD (September 04, 2008 2:47 PM) CC: fatigue,shaky feeling Is Patient Diabetic? No   Primary Care Provider:  [name], MD  CC:  fatigue and shaky feeling.  History of Present Illness: walk-in visit.  discussed:  1.  shaky, chills, increased urination--started 2 nights ago.  sudden onset.  did not take temp at home (afebrile here).  increased urination (3 times per night--usually only once per night)  also increased urination during day.  +urgency.  no true dysuria.  normal Bms past few days.  no blood.    **also complained of calf pain, but explained that would not be able to adequately cover this issue at today's visit--has visit scheduled with PCP next week.  Habits & Providers  Alcohol-Tobacco-Diet     Other Tobacco cigar  Allergies: No Known Drug Allergies  Physical Exam  General:  Thin, pleasant AAM, NAD Lungs:  mild wheezes and decreased air movement Heart:  tachy; regular rhythm Abdomen:  mild ttp in epigastrium no CVA tenderness Prostate:  ? mild enlargement of prostate, but not tender or boggy; no masses Extremities:  no edema Skin:  no rashes apparent Additional Exam:  vital signs reviewed ; tachycardia noted   Impression & Recommendations:  Problem # 1:  UTI (ICD-599.0) Assessment New u/a and sxs c/w infection.  complicated because he is a male.  treat for 1 week with cipro.  proceed with work-up--renal ultrasound, CBC, CMET.  urine culture ordered. His updated medication list for this problem includes:    Ciprofloxacin Hcl 500 Mg Tabs (Ciprofloxacin hcl) .Marland Kitchen... 1 tab by mouth two times a day for 7 days  Orders: Ultrasound (Ultrasound)  Complete Medication List: 1)  Ferrous Sulfate 324 Mg  Tabs (Ferrous sulfate) .... One tablet by mouth two times a day for anemia 2)  Proventil Hfa 108 (90 Base) Mcg/act Aers (Albuterol sulfate) .... 2 puffs inh q 4h as needed wheezing 3)  Colchicine 0.6 Mg Tabs (Colchicine) .... Take one tablet by mouth every hour when acute gouty flare occurs until diarrhea or symptoms improve 4)  Advair Diskus 250-50 Mcg/dose Misc (Fluticasone-salmeterol) .Marland Kitchen.. 1 puff in the morning and in the evening 5)  Allopurinol 300 Mg Tabs (Allopurinol) .... One tablet by mouth daily 6)  Ciprofloxacin Hcl 500 Mg Tabs (Ciprofloxacin hcl) .Marland Kitchen.. 1 tab by mouth two times a day for 7 days  Other Orders: Urinalysis-[facility] (00000) Comp Met-[facility] [id number]) CBC-[facility] [id number]) Urine Culture-[facility] [id number])  Patient Instructions: 1)  It was nice to meet you today.  2)  I think you have a urinary tract infection.  Take the antibiotic I prescribed you for 1 week. 3)  We will help you set up an appointment to get an ultrasound of your kidneys. 4)  I will call you if your labs are not normal. 5)  Be sure to keep your appt with Dr. Netty Starring next week. 6)  Please call our office (or after hours line) if you are not feeling better by Saturday afternoon. Prescriptions: CIPROFLOXACIN HCL 500 MG TABS (CIPROFLOXACIN HCL) 1 tab by mouth two times a day for 7 days  #14 x 0   Entered and Authorized  by:   Carin Hock MD   Signed by:   Carin Hock MD on 09/04/2008   Method used:   Electronically to        [id number] (retail)       [address]       [place], [place]  [address]       Ph: [phone] or [phone]       Fax: [phone]   RxID:   [id number]   Laboratory Results   Urine Tests  Date/Time Received: September 04, 2008 3:07 PM  Date/Time Reported: September 04, 2008 3:23 PM   Routine Urinalysis   Color: yellow Appearance: Hazy Glucose: negative   (Normal Range: Negative) Bilirubin: negative   (Normal Range: Negative) Ketone:  negative   (Normal Range: Negative) Spec. Gravity: 1.015   (Normal Range: 1.003-1.035) Blood: moderate   (Normal Range: Negative) pH: 5.5   (Normal Range: 5.0-8.0) Protein: 100   (Normal Range: Negative) Urobilinogen: 1.0   (Normal Range: 0-1) Nitrite: positive   (Normal Range: Negative) Leukocyte Esterace: large   (Normal Range: Negative)  Urine Microscopic WBC/HPF: >20 RBC/HPF: 0-3 Bacteria/HPF: 3+ Epithelial/HPF: rare    Comments: ...........test performed by...........Marland KitchenHedy Camara, CMA     Appended Document: Orders Update    Clinical Lists Changes  Orders: Added new Test order of [facility]- Est Level  3 [id number]) - Signed

## 2010-04-08 NOTE — Progress Notes (Signed)
Summary: Triage  Phone Note Call from Patient Call back at Home Phone [phone]   Summary of Call: Pt is supposed to have gallbladder surgery soon and his wife called stating that pt is having a lot of pain in his stomach and would like to speak to a nurse to see what they should do. Initial call taken by: Drucie Ip,  February 19, 2007 3:38 PM  Follow-up for Phone Call        pt states he has no pain now. wife said he does. pt c/o being cold all the time. labs indicate low HGB & HCT. they want to make sure he can get his surgery done. pcp has a full schedule. appt made for Tues Follow-up by: Elige Radon RN,  February 19, 2007 3:43 PM

## 2010-04-08 NOTE — Letter (Signed)
Summary: Results Follow-up Letter  [facility]  [address]   [place], [place] [address]   Phone: [phone]  Fax: [phone]    05/02/2008  [address] [place], [place]  [address]  Dear Mr. MUDGETT,   Your lab work was all normal.  Kidneys are doing fine, your anemia is under control, and your prostate is within normal limits.  Please let me know if you continue to have urinary symptoms.    Sincerely,  [name]  MD [facility]

## 2010-04-08 NOTE — Assessment & Plan Note (Signed)
Summary: F/U VISIT/[facility]   Vital Signs:  Patient Profile:   77 Years Old Male Weight:      145 pounds Pulse rate:   68 / minute BP sitting:   148 / 71  (left arm)  Pt. in pain?   yes    Location:   left foot    Intensity:   4 while seated and 7 with standing or walking    Type:       aching  Vitals Entered ByArnette Schaumann RN (March 20, 2007 10:44 AM)                  PCP:  [name], MD  Chief Complaint:  f/u and gout in left foot.  History of Present Illness: [name] is a 77yo AAM well known to me.  He presents today for a scheduled visit to evaluate his respiratory function as well as his condition regarding preparation for his scheduled cholecystectomy with Dr. Brantley Stage on 1/28.   He was seen by Dr. Samara Deist this morning for his PFTs.  He denies using his albuterol inhaler b/c he has not been experiencing much dysnpnea or sputum production.   His wife reports that he continues to have a good appetite and his energy level has been stable.  He denies any abd pain, N/V, or problems with his percutaneous cholecystostomy drain.  His wife flushes the drain for him daily. He c/o new left foot pain of the dorsal region for the past few days.  He thinks it may be the beginning of an acute gouty flare.  He denies any noticiable swelling or redness but reports severe pain.  Current Allergies: No known allergies       Physical Exam  General:     Thin, elderly, AAM in no acute distress; alert,appropriate and cooperative throughout examination Neck:     no lymphadenopathy Lungs:     Normal respiratory effort, chest expands symmetrically. Lungs are clear to auscultation, no crackles or wheezes. Heart:     Normal rate and regular rhythm. S1 and S2 normal without gallop, murmur, click, rub or other extra sounds. Abdomen:     Percutaneous cholecystostomy drain in place with no signs of erythema, swelling, or purulent drainage.  It appears to be draining well.  No tenderness  to palpation.  No distention.  +BS Msk:     Both feet are symmetrical; no signs of edema, erythema on the left dorsal foot; moderate tenderness to palpation of the 1st-5th metatarsals.  No pain or swelling the 1st MTP joint or in the ankle.   Pulses:     equal pulses in the dorsalis pedis Extremities:     no signs of cyanosis or clubbing    Impression & Recommendations:  Problem # 1:  Hx of ACUTE AND CHRONIC CHOLECYSTITIS (ICD-575.12) Pt is without symptoms today.  Drain appears to be working fine.  Goal is to prepare him for his procedure;  Pt seems hemodynamically stable but will check a CBC as well as a CMET today;  Will relay the information to Dr. Brantley Stage prior to his surgery.     Orders: Comp Met-[facility] [id number]) CBC-[facility] [id number]) [place]- Est  Level 4 [id number])   Problem # 2:  COPD (ICD-496) We were able to confirm a dx of COPD today with his PFTs.  His FEV1 postbronchiodilator is well below 80% predicted.  His ratio of FEV1/FVC was 0.34.  All of this confirms a dx of COPD.  Plan to d/c Asmanex  and Atrovent and start him on Advair with Proventil as a rescue med.  All of this was d/w Dr. Samara Deist and he is in agreement.  Pt understands his condition and understands how vital it is for him to continue to quit smoking.   The following medications were removed from the medication list:    Asmanex 60 Metered Doses 220 Mcg/inh Aepb (Mometasone furoate) ..... One inh in the morning and one in the evening    Atrovent Hfa 17 Mcg/act Aers (Ipratropium bromide hfa) ..... One inh in the morning and one in the evening  His updated medication list for this problem includes:    Proventil Hfa 108 (90 Base) Mcg/act Aers (Albuterol sulfate) .Marland Kitchen... 2 puffs inh q 4h as needed wheezing    Advair Diskus 250-50 Mcg/dose Misc (Fluticasone-salmeterol) .Marland Kitchen... 1 puff in the morning and in the evening  Orders: [place]- Est  Level 4 [id number])   Problem # 3:  GOUT, UNSPECIFIED (ICD-274.9) Although the exam is not  completely c/w an acute gouty flare, he has had so many episodes that I trust him when he says that he thinks this may be the beginning of another flare.  I plan to increase his frequency of Colchicine to 4 times a day for the next 2 1/2 days.  I will check his kidney fxn to make sure he can handle this.  I will also provide some oxycodone to help him withstand the pain.  After  2 1/2 days, I want him to return to his maintenance dose.  His updated medication list for this problem includes:    Colchicine 0.6 Mg Tabs (Colchicine) .Marland Kitchen... Take one tablet by mouth every 6 hours until symptoms resolve or gi upset;  return to 1 tablet twice daily after 3 days  Orders: Uric Acid-[facility] [id number]) [place]- Est  Level 4 [id number])   Complete Medication List: 1)  Ferrous Sulfate 324 Mg Tabs (Ferrous sulfate) .... One tablet by mouth two times a day for anemia 2)  Proventil Hfa 108 (90 Base) Mcg/act Aers (Albuterol sulfate) .... 2 puffs inh q 4h as needed wheezing 3)  Colchicine 0.6 Mg Tabs (Colchicine) .... Take one tablet by mouth every 6 hours until symptoms resolve or gi upset;  return to 1 tablet twice daily after 3 days 4)  Advair Diskus 250-50 Mcg/dose Misc (Fluticasone-salmeterol) .Marland Kitchen.. 1 puff in the morning and in the evening 5)  Oxycodone Hcl 5 Mg Tabs (Oxycodone hcl) .... Take 1-2 tablets by mouth every 6 hours as needed for pain   Patient Instructions: 1)  Please schedule a follow-up appointment in 2 months. 2)  We confirmed a diagnosis of COPD today.  I have changed your medication to Advair.  I want you to stop the Asmanex and the Atrovent.  Continue to stop smoking b/c this is the best thing you can do for your health. 3)  We checked your blood today to prepare you for your upcoming surgery.  Continue to eat and take your daily iron supplements.  I will send my reports to Dr. Brantley Stage.  I want to see you in 2 months after you have had time to recover from your procedure. 4)  I increased your gout  medication for the next 3 days in hopes of preventing another flare.  I want you to return to your regular dose after 3 days or after the pain improves or if you have bad diarrhea.  I have also given you additional pain meds.  Prescriptions: OXYCODONE HCL 5 MG  TABS (OXYCODONE HCL) take 1-2 tablets by mouth every 6 hours as needed for pain  #40 x 0   Entered and Authorized by:   [name]  MD   Signed by:   [name]  MD on 03/20/2007   Method used:   Handwritten   [id number] FERROUS SULFATE 324 MG  TABS (FERROUS SULFATE) one tablet by mouth two times a day for anemia  #60 x 1   Entered and Authorized by:   [name]  MD   Signed by:   [name]  MD on 03/20/2007   Method used:   Electron[facility] sent to ...       [facility]  [address] [id number]       [place]       [place], [place]  [address]       Ph: [phone] or [phone]       Fax: [phone]   RxID:   [id number] ADVAIR DISKUS 250-50 MCG/DOSE  MISC (FLUTICASONE-SALMETEROL) 1 puff in the morning and in the evening  #1 x 1   Entered and Authorized by:   [name]  MD   Signed by:   [name]  MD on 03/20/2007   Method used:   Electronically sent to ...       [facility]  [address] [id number]       [place]       [place], [place]  [address]       Ph: [phone] or [phone]       Fax: [phone]   RxID:   [id number] COLCHICINE 0.6 MG  TABS (COLCHICINE) take one tablet by mouth every 6 hours until symptoms resolve or GI upset;  return to 1 tablet twice daily after 3 days  #60 x 1   Entered and Authorized by:   [name]  MD   Signed by:   [name]  MD on 03/20/2007   Method used:   Electronically sent to ...       [facility]  [address] [id number]       [address]       [place], [place]  [address]       Ph: [phone] or [phone]       Fax: [phone]   RxID:    [id number]  ]

## 2010-04-08 NOTE — Miscellaneous (Signed)
  Clinical Lists Changes  Problems: Changed problem from ASTHMA (ICD-493.90) to ASTHMA, PERSISTENT (ICD-493.90)

## 2010-04-08 NOTE — Assessment & Plan Note (Signed)
Summary: flu shot,tcb  Nurse Visit   Vital Signs:  Patient profile:   77 year old male Temp:     98.3 degrees F  Allergies: No Known Drug Allergies  Immunizations Administered:  Influenza Vaccine # 1:    Vaccine Type: Fluvax MCR    Site: right deltoid    Mfr: [place]    Dose: 0.5 ml    Route: IM    Given by: Isabelle Course LPN    Exp. Date: 09/03/2009    Lot #: [id number]    VIS given: 09/28/06 version given January 16, 2009.  Flu Vaccine Consent Questions:    Do you have a history of severe allergic reactions to this vaccine? no    Any prior history of allergic reactions to egg and/or gelatin? no    Do you have a sensitivity to the preservative Thimersol? no    Do you have a past history of Guillan-Barre Syndrome? no    Do you currently have an acute febrile illness? no    Have you ever had a severe reaction to latex? no    Vaccine information given and explained to patient? yes  Orders Added: 1)  Influenza Vaccine MCR [00025] 2)  Administration Flu vaccine - MCR [id number]

## 2010-04-08 NOTE — Progress Notes (Signed)
Summary: BLOOD CULTURE  Phone Note Other Incoming Call back at [phone]   Call placed by: [facility] Summary of Call: NEED RETURN CALL ON BLOOD CULTURE DONE ON PT.  WILL ALSO FAX RESULTS. Initial call taken by: Benna Dunks,  February 26, 2007 3:33 PM  Follow-up for Phone Call        Patient was admitted to hospital and taken care of by inpatient team and is Dr. Raylene Miyamoto primary patient.  If warranted, contact him and/or inpatient team. Follow-up by: Karlton Lemon MD,  February 26, 2007 3:57 PM  Additional Follow-up for Phone Call Additional follow up Details #1::        Contacted patient and instructed him to take an additional 9 days of cipro and 13 days of flagyl to complete a 14 day course of each for bacteremia.  Prescriptions sent to pharmacy.  Patient understands and is in agreement. Additional Follow-up by: Brion Aliment MD,  February 27, 2007 2:54 PM    New/Updated Medications: CIPRO 500 MG  TABS (CIPROFLOXACIN HCL) 1 by mouth two times a day x 9 more days FLAGYL 500 MG  TABS (METRONIDAZOLE) 1 by mouth two times a day x 13 more days   Prescriptions: FLAGYL 500 MG  TABS (METRONIDAZOLE) 1 by mouth two times a day x 13 more days  #26 x 0   Entered and Authorized by:   Brion Aliment MD   Signed by:   Brion Aliment MD on 02/27/2007   Method used:   Elect[facility] to ...       [facility]  [address] [id number]       [place]       [place], [place]  [address]       Ph: [phone] or [phone]       Fax: [phone]   RxID:   [id number] CIPRO 500 MG  TABS (CIPROFLOXACIN HCL) 1 by mouth two times a day x 9 more days  #18 x 0   Entered and Authorized by:   Brion Aliment MD   Signed by:   Brion Aliment MD on 02/27/2007   Method used:   Electronically sent to ...       [facility]  [address] [id number]       [place]       [place], [place]  [address]       Ph: [phone] or [phone]       Fax:  [phone]   RxID:   [id number]

## 2010-04-08 NOTE — Consult Note (Signed)
Summary: [facility]   Imported By: Junie Panning LEVAN 03/21/2007 11:07:54  _____________________________________________________________________  External Attachment:    Type:   Image     Comment:   External Document

## 2010-04-08 NOTE — Assessment & Plan Note (Signed)
Summary: f/u left hamstring pain and s/p UTI   Vital Signs:  Patient profile:   77 year old male Height:      69 inches Weight:      168.3 pounds BMI:     24.94 Temp:     97.3 degrees F Pulse rate:   97 / minute BP sitting:   118 / 68  (right arm)  Vitals Entered By: Arnette Schaumann RN (September 12, 2008 4:03 PM) CC: f/u leg pain and UTI Is Patient Diabetic? No Pain Assessment Patient in pain? yes     Location: left leg  Intensity: 6 Type: aching   Primary Care Provider:  [name], MD  CC:  f/u leg pain and UTI.  History of Present Illness: 77yo M f/u UTI and left leg pain.  UTI: s/p tx w/ cipro.  Symptoms resolve.  No further fevers, no dysuria, or hematuria.  Discussed renal U/S results.  Left leg pain: Localized to posterior left thigh at mid hamstring.  x4 months.  No instigating trauma or fall.  No redness, swelling, or bruising.  Sharp pain at rest and w/ flexion of hamstring.  No pain of the hip or knee.    Habits & Providers  Alcohol-Tobacco-Diet     Tobacco Status: current     Other Tobacco cigar     Other per week 2 cigars per day  Allergies: No Known Drug Allergies  Review of Systems      See HPI  Physical Exam  General:  Elderly AAM, A&O, mild distress with movements Msk:  Inspection: Left hamstring- no erythema, ecchymosis, or edema; no signs of trauma TTP; 3/5 strength w/ flexion; pain w/ passive motion No abnl of hip and knee   Impression & Recommendations:  Problem # 1:  LEG PAIN, LEFT (ICD-729.5) Assessment Unchanged Likely hamstring strain; will obtain xray to r/o possible bony abnormalities given pain at rest and at night; advise to use ace bandage for compression, heat/massage/ and physical therapy.  Will give short regimen of ibuprofen for inflammation and vicodin for breakthrough pain; will reassess in 2-4 weeks.  advise to refrain from activities that worsen his pain.   Orders: Physical Therapy Referral (PT) [place]- Est  Level 4  [id number]) Radiology other (Radiology Other)  Problem # 2:  previous UTI Assessment: Comment Only Resolved.  Possibe prostate impingement causing stasis of the bladder...not completely convinced based on prostate exam and psa.  Will follow up accordingly.  Complete Medication List: 1)  Ferrous Sulfate 324 Mg Tabs (Ferrous sulfate) .... One tablet by mouth two times a day for anemia 2)  Proventil Hfa 108 (90 Base) Mcg/act Aers (Albuterol sulfate) .... 2 puffs inh q 4h as needed wheezing 3)  Advair Diskus 250-50 Mcg/dose Misc (Fluticasone-salmeterol) .Marland Kitchen.. 1 puff in the morning and in the evening 4)  Allopurinol 300 Mg Tabs (Allopurinol) .... One tablet by mouth daily 5)  Vicodin 5-500 Mg Tabs (Hydrocodone-acetaminophen) .... One tablet every 4 hours as needed for pain 6)  Ibuprofen 800 Mg Tabs (Ibuprofen) .... One tablet every 8 hours for no more than 10 days take it with food  Patient Instructions: 1)  Schedule f/u appt in 2-4 weeks. 2)  We will set you up with an xray today, and physical therapy. 3)  I want you wrap the left upper leg with an ace wrap, majority of the day, apply heat and massage. 4)  Use ibuprofen for no more than 10 days. Prescriptions: IBUPROFEN 800 MG TABS (  IBUPROFEN) one tablet every 8 hours for no more than 10 days take it with food  #30 x 0   Entered and Authorized by:   [name]  MD   Signed by:   [name]  MD on 09/12/2008   Method used:   Electronically to        [facility] [id number]* (retail)       [address]       [place], [place]  [address]       Ph: [phone] or [phone]       Fax: [phone]   RxID:   [id number] VICODIN 5-500 MG TABS (HYDROCODONE-ACETAMINOPHEN) one tablet every 4 hours as needed for pain  #25 x 0   Entered and Authorized by:   [name]  MD   Signed by:   [name]  MD on 09/12/2008   Method used:   Print then Give to Patient   RxID:   [id number]

## 2010-04-08 NOTE — Consult Note (Signed)
[facility]   Imported By: Beryle Lathe 05/16/2008 14:00:12  _____________________________________________________________________  External Attachment:    Type:   Image     Comment:   External Document

## 2010-04-08 NOTE — Assessment & Plan Note (Signed)
Summary: back pain/side pain wp   Vital Signs:  Patient Profile:   77 Years Old Male Weight:      143 pounds Pulse rate:   92 / minute BP sitting:   108 / 72  (right arm)  Pt. in pain?   yes    Location:   back    Intensity:   9  Vitals Entered By: [place], (March 12, 2007 1:39 PM)                  Chief Complaint:  low back pain since Friday. no injury.Marland Kitchen  History of Present Illness: 77 yo with h/o chronic cholecystitis with percutaneous drain in place.  Cholecystectomy planned for 1/28 by Dr. Brantley Stage.  Recently admitted 12/16-12/18 for RUQ pain secondary to blocked drain that was changed out by Interventional radiology, also had klebsiella bacteremia at the time.  Started 3 days ago with left sided back pain.  No h/o trauma or lifting.  No rash, no h/o shingles, no fevers, no chills, no abdominal pain, no vomiting, no dysuria, no hematuria.  Gallbladder drain is flushing well according to patient.  Pain feels deep inside abdomen, no superficial like rib or skin pain.  Pain is intermittent, worse in morning and evening.  Taking oxycodone with some relief to take the edge off.  Current Allergies: No known allergies       Physical Exam  Abdomen:     +BS in all 4 quadrants, soft, nontender to deep palpation, no rebound, no distension, dressing around gallbladder drain is clean and dry.  Msk:     Back- no notable swelling or deformity, right flank is extremely tender to very light palpation- just touching finger tips to flank reproduces pain in an area that spans at least 2-3 dermatomes.   Skin:     no rash over back/flank    Impression & Recommendations:  Problem # 1:  FLANK PAIN, LEFT (ICD-789.09) Assessment: New uncertain etiology- differential includes rib fracture (no h/o fracture though), UTI (urinalysis is normal though), zoster (no rash yet), musculoskeletal, and given his h/o cholecystitis I would put abdominal processes in the differential  (although he has no abdominal pain, no fevers, and his abdominal examination is normal). Plan: 1.  CXR- r/o rib fx 2.  pain control- motrin/tylenol + oxycodone 3.  close follow up for abd pain, fevers, chills, etc. 4.  f/u Thursday if pain not improving.  His updated medication list for this problem includes:    Oxycodone Hcl 5 Mg Tabs (Oxycodone hcl) .Marland Kitchen... 1-2 tablets every 6 hours as needed for pain  Orders: Urinalysis-[facility] (00000) Diagnostic X-Ray/Fluoroscopy (Diagnostic X-Ray/Flu) [place]- Est Level  3 [id number])   Complete Medication List: 1)  Ferrous Sulfate 324 Mg Tabs (Ferrous sulfate) .... One tablet by mouth two times a day for anemia 2)  Proventil Hfa 108 (90 Base) Mcg/act Aers (Albuterol sulfate) .... 2 puffs inh q 4h as needed wheezing 3)  Asmanex 60 Metered Doses 220 Mcg/inh Aepb (Mometasone furoate) .... One inh in the morning and one in the evening 4)  Atrovent Hfa 17 Mcg/act Aers (Ipratropium bromide hfa) .... One inh in the morning and one in the evening 5)  Colchicine 0.6 Mg Tabs (Colchicine) .... Take one tablet by mouth in the morning and one at night 6)  Oxycodone Hcl 5 Mg Tabs (Oxycodone hcl) .Marland Kitchen.. 1-2 tablets every 6 hours as needed for pain   Patient Instructions: 1)  Go get your chest x-ray  to look for a broken rib.  Your doctor will call you with the results of your chest x-ray. 2)  you may take tylenol or motrin every 6 hours for side pain.  If not relieved, you may take oxycodone every 6 hours for severe pain. 3)  Seek medical attention for stomach pain, fevers, chills, or if your drain is not flushing well. 4)  If your side and back pain is not getting better by Thursday, please make an appointment at [facility] for closer evaluation.    Prescriptions: OXYCODONE HCL 5 MG  TABS (OXYCODONE HCL) 1-2 tablets every 6 hours as needed for pain  #30 x 0   Entered and Authorized by:   Dixon Boos MD   Signed by:   Dixon Boos MD on 03/12/2007   Method used:    Handwritten   [id number]  ] Laboratory Results   Urine Tests  Date/Time Received: March 12, 2007 1:54PM  Date/Time Reported: March 12, 2007 1:58 PM   Routine Urinalysis   Color: yellow Appearance: Clear Glucose: negative   (Normal Range: Negative) Bilirubin: negative   (Normal Range: Negative) Ketone: negative   (Normal Range: Negative) Spec. Gravity: <1.005   (Normal Range: 1.003-1.035) Blood: negative   (Normal Range: Negative) pH: 6.5   (Normal Range: 5.0-8.0) Protein: negative   (Normal Range: Negative) Urobilinogen: 0.2   (Normal Range: 0-1) Nitrite: negative   (Normal Range: Negative) Leukocyte Esterace: negative   (Normal Range: Negative)    Comments: ...................................................................DONNA [facility]  March 12, 2007 1:58 PM

## 2010-04-08 NOTE — Miscellaneous (Signed)
Summary: GI APPT  GI APPT IS WITH DR Penelope Coop ON: TUESDAY 11/18 @ 10:30 ADDRESS IS [address], [address] PHONE NUMBER IS [id number] FAXED NOTES TO [phone] PT INFORMED

## 2010-04-08 NOTE — Assessment & Plan Note (Signed)
Summary: fu for chronic medical issues   Vital Signs:  Patient Profile:   77 Years Old Male Height:     69 inches Weight:      156.6 pounds BMI:     23.21 Temp:     98.1 degrees F Pulse rate:   77 / minute BP sitting:   134 / 86  (left arm)  Pt. in pain?   no  Vitals Entered By: Arnette Schaumann RN (September 11, 2007 4:46 PM)                  PCP:  [name], MD  Chief Complaint:  f/u for chronic medical issues.  History of Present Illness: [name] is a 77yo AAM h/o severe gout flares, anemia,  COPD, and recent cholecystectomy here for f/u.  Gout: He has had sporadic flares of the lateral right foot while being on his allopurinol that is treated and resolves after using the colchicine.  The flare lasts a few days but the pain is tolerable.  He reports some occasional EtOH intake but not excessive.  s/p Cholecystectomy: Doing very well since the procedure.  Eating more and gaining weight.  No complications reported.  Anemia: Reports that he occasionally remembers to take his Fe supplements.  Denies any acute bleeding in his stool or sputum.  Denies syncope or weakness.  COPD: Good control on Advair.  Tobacco: smokes occasional cigar.  No cigarettes.    Current Allergies: No known allergies   Past Medical History:    Reviewed history from 04/12/2007 and no changes required:       Acute cholecystitis - s/p cholecystostomy tube 9/08       Gout       COPD       HTN       Iron Deficiency Anemia  Past Surgical History:    Reviewed history from 04/12/2007 and no changes required:       Laproscopic cholecystectomy w/ cholangiogram 04/04/07   Social History:    Reviewed history from 02/15/2007 and no changes required:       He is married and lives with his wife and daughter.     Risk Factors:  Tobacco use:  current    Cigars:  Yes -- 2/ per day per week    Counseled to quit/cut down tobacco use:  yes   Review of Systems      See HPI   Physical Exam   General:     Thin AAM, ,in no acute distress; alert,appropriate and cooperative throughout examination Eyes:     no pallor of conjunctiva Lungs:     Normal respiratory effort, chest expands symmetrically. Lungs are clear to auscultation, no crackles or wheezes. Heart:     Normal rate and regular rhythm. S1 and S2 normal without gallop, murmur, click, rub or other extra sounds. Abdomen:     Bowel sounds positive,abdomen soft and non-tender without masses, organomegaly or hernias noted. Msk:     no joint pain or effusion Pulses:     2+ pedal and radial Extremities:     no edema    Impression & Recommendations:  Problem # 1:  CHOLECYSTECTOMY, LAPAROSCOPIC, HX OF (ICD-V45.79) Assessment: Comment Only Doing well.  Wt gain is encouraging.  Problem # 2:  COPD (ICD-496) Assessment: Unchanged No problems.  Continue on Advair.  Advised to quit smoking cigars.   His updated medication list for this problem includes:    Proventil Hfa 108 (90 Base) Mcg/act  Aers (Albuterol sulfate) .Marland Kitchen... 2 puffs inh q 4h as needed wheezing    Advair Diskus 250-50 Mcg/dose Misc (Fluticasone-salmeterol) .Marland Kitchen... 1 puff in the morning and in the evening  Orders: [facility]- Est  Level 4 [id number])   Problem # 3:  TOBACCO USE, QUIT (ICD-V15.82) Assessment: New Started smoking cigars.  Advised on quitting.  Problem # 4:  ANEMIA, IRON DEF, NOS (ICD-280.9) Assessment: Comment Only Not symptomatic; will check CBC today and emphasize importance of Fe supplement;   His updated medication list for this problem includes:    Ferrous Sulfate 324 Mg Tabs (Ferrous sulfate) ..... One tablet by mouth two times a day for anemia  Orders: [facility]- Est  Level 4 [id number])  Future Orders: CBC w/Diff-[facility] [id number]) ... 09/14/2007   Problem # 5:  GOUT, UNSPECIFIED (ICD-274.9) Assessment: Deteriorated Increasing amounts of flares; will plan on increasing dose of Allopurinol    His updated medication list for this problem includes:     Colchicine 0.6 Mg Tabs (Colchicine) .Marland Kitchen... Take one tablet by mouth every hour when acute gouty flare occurs until diarrhea or symptoms improve    Allopurinol 300 Mg Tabs (Allopurinol)  Orders: [facility]- Est  Level 4 [id number])  Future Orders: Basic Met-[facility] [id number]) ... 09/14/2007   Complete Medication List: 1)  Ferrous Sulfate 324 Mg Tabs (Ferrous sulfate) .... One tablet by mouth two times a day for anemia 2)  Proventil Hfa 108 (90 Base) Mcg/act Aers (Albuterol sulfate) .... 2 puffs inh q 4h as needed wheezing 3)  Colchicine 0.6 Mg Tabs (Colchicine) .... Take one tablet by mouth every hour when acute gouty flare occurs until diarrhea or symptoms improve 4)  Advair Diskus 250-50 Mcg/dose Misc (Fluticasone-salmeterol) .Marland Kitchen.. 1 puff in the morning and in the evening 5)  Allopurinol 300 Mg Tabs (Allopurinol)   Patient Instructions: 1)  I would like you to come by the lab at your earliest convenience to check some lab work. 2)  We increased your allopurinol to 300mg  today. 3)  Please schedule a follow-up appointment in 6 months.   Prescriptions: ADVAIR DISKUS 250-50 MCG/DOSE  MISC (FLUTICASONE-SALMETEROL) 1 puff in the morning and in the evening  #1 x 2   Entered and Authorized by:   [name]  MD   Signed by:   [name]  MD on 09/11/2007   Method used:   Handwritten   [id number] ALLOPURINOL 300 MG  TABS (ALLOPURINOL)   #31 x 3   Entered and Authorized by:   [name]  MD   Signed by:   [name]  MD on 09/11/2007   Method used:   Handwritten   [id number] COLCHICINE 0.6 MG  TABS (COLCHICINE) take one tablet by mouth every hour when acute gouty flare occurs until diarrhea or symptoms improve  #30 x 3   Entered and Authorized by:   [name]  MD   Signed by:   [name]  MD on 09/11/2007   Method used:   Handwritten   [id number] FERROUS SULFATE 324 MG  TABS (FERROUS SULFATE) one tablet by mouth  two times a day for anemia  #60 x 1   Entered and Authorized by:   [name]  MD   Signed by:   [name]  MD on 09/11/2007   Method used:   Electron[facility] to ...       [facility]  [address] [id number]       [id number] [place], [place]  [address]       Ph: [phone] or [phone]       Fax: [phone]   RxID:   [id number]  ]

## 2010-04-08 NOTE — Assessment & Plan Note (Signed)
Summary: fu surgery wp  using prunes for bms. not taking any pain meds now.  Vital Signs:  Patient Profile:   77 Years Old Male Height:     69 inches Weight:      140.7 pounds BMI:     20.85 O2 Sat:      95 % Temp:     98 degrees F Pulse rate:   94 / minute BP sitting:   128 / 75  Pt. in pain?   no  Vitals Entered By: Elige Radon RN (April 12, 2007 3:13 PM)                  PCP:  [name], MD  Chief Complaint:  surgery f/u and abd pain.  History of Present Illness: 77yo s/p Laproscopic cholecystectomy with cholangiogram on 04/04/07 :  According to operative reports and patient report, the procedure was successful without any complications.  Pt has been recovering well.  His appetite is returning slowly.  No signs of infection or unmanegable pain.  The only pain he has been experiencing is some suprapubic abd pain since the surgery.    Suprapubic abd pain: Began after the surgery occurs sporadically and is improved with urination.  No burning or pain with urination.  No hematuria noted.  No N/V, no fever.  No radiating pain.  Pain is focal to 3 cm below the umbilicus.    Gout: No further flares to date.  Currently on colchicine.  Reports that he cannot remember why his doctors never tried him on allopurinol.    Anemia: Pt asymptomatic.  No weakness, dizziness, headaches, or SOB.  Last Hgb check on 1/26 was 12.    Current Allergies: No known allergies   Past Medical History:    Acute cholecystitis - s/p cholecystostomy tube 9/08    Gout    COPD    HTN    Iron Deficiency Anemia  Past Surgical History:    Laproscopic cholecystectomy w/ cholangiogram 04/04/07    Risk Factors:  Tobacco use:  quit    Year quit:  2007    Pack-years:  0   Review of Systems      See HPI   Physical Exam  General:     Thin, AAM, in no acute distress; alert,appropriate and cooperative throughout examination Lungs:     Normal respiratory effort, chest expands  symmetrically. Lungs are clear to auscultation, no crackles or wheezes. Heart:     Normal rate and regular rhythm. S1 and S2 normal without gallop, murmur, click, rub or other extra sounds. Abdomen:     Soft, ND, mild tenderness to palpation 3 cm below umbilicus is suprapubic region.  No rebound. No masses. No hernia Skin:     Appropriate ecchymosis of laproscopic entry sites    Impression & Recommendations:  Problem # 1:  CHOLECYSTECTOMY, LAPAROSCOPIC, HX OF (ICD-V45.79) Assessment: Improved Pt is recovering well.  He has f/u with Dr. Brantley Stage in 2 weeks.  Will continue to encourage pt to eat well to maintain strength and weight.     Orders: [facility]- Est  Level 4 [id number])   Problem # 2:  SUPRAPUBIC PAIN (ICD-789.09) Assessment: New Discussed with Dr. Damita Dunnings.  Most likely bladder irritation secondary to foley from surgery.  Advised pt to call or return if experiencing dysuria or hematuria.  Will check UA for infection or blood.     His updated medication list for this problem includes:    Oxycodone Hcl 5 Mg  Tabs (Oxycodone hcl) .Marland Kitchen... Take 1-2 tablets by mouth every 6 hours as needed for pain  Orders: [name]- Est  Level 4 [id number])   Problem # 3:  GOUT, UNSPECIFIED (ICD-274.9) Assessment: Improved Will change gout regimen to prophylaxis allopurinol and advised pt to keep colchicine for acute flares only.  His condition is stable.   His updated medication list for this problem includes:    Colchicine 0.6 Mg Tabs (Colchicine) .Marland Kitchen... Take one tablet by mouth every hour when acute gouty flare occurs until diarrhea or symptoms improve    Allopurinol 100 Mg Tabs (Allopurinol) .Marland Kitchen... Take two tablets by mouth every day  Orders: [facility]- Est  Level 4 [id number])   Problem # 4:  ANEMIA, IRON DEF, NOS (ICD-280.9) Assessment: Improved Hgb is remaining stable.  Plan to continue Fe supplements.   His updated medication list for this problem includes:    Ferrous Sulfate 324 Mg Tabs (Ferrous sulfate)  ..... One tablet by mouth two times a day for anemia  Orders: [facility]- Est  Level 4 [id number])   Complete Medication List: 1)  Ferrous Sulfate 324 Mg Tabs (Ferrous sulfate) .... One tablet by mouth two times a day for anemia 2)  Proventil Hfa 108 (90 Base) Mcg/act Aers (Albuterol sulfate) .... 2 puffs inh q 4h as needed wheezing 3)  Colchicine 0.6 Mg Tabs (Colchicine) .... Take one tablet by mouth every hour when acute gouty flare occurs until diarrhea or symptoms improve 4)  Advair Diskus 250-50 Mcg/dose Misc (Fluticasone-salmeterol) .Marland Kitchen.. 1 puff in the morning and in the evening 5)  Oxycodone Hcl 5 Mg Tabs (Oxycodone hcl) .... Take 1-2 tablets by mouth every 6 hours as needed for pain 6)  Allopurinol 100 Mg Tabs (Allopurinol) .... Take two tablets by mouth every day  Other Orders: Urinalysis-[facility] (00000)   Patient Instructions: 1)  Please schedule a follow-up appointment in 3 months. 2)  I think your bladder wall is irritated from the recent foley catheter that was placed during your surgery.  It should improve gradually.  I want you to call me if you have burning with urination, pain with urination, or blood in your urine.  I will call you if you have abnl lab results. 3)  Today we changed your gout medication.  Take the allopurinol daily and take the colchicine only when you have an acute flare. 4)  Keep your appointment with Dr. Brantley Stage.    Prescriptions: ALLOPURINOL 100 MG  TABS (ALLOPURINOL) take two tablets by mouth every day  #60 x 3   Entered and Authorized by:   [name]  MD   Signed by:   [name]  MD on 04/12/2007   Method used:   [facility] ...       [facility]  [address] [id number]       [place]       [place], [place]  [address]       Ph: [phone] or [phone]       Fax: [phone]   RxID:   [id number]  ] Laboratory Results   Urine Tests  Date/Time Received: April 12, 2007 3:44 PM  Date/Time  Reported: April 12, 2007 4:01 PM   Routine Urinalysis   Color: yellow Appearance: Hazy Glucose: negative   (Normal Range: Negative) Bilirubin: negative   (Normal Range: Negative) Ketone: negative   (Normal Range: Negative) Blood: small   (Normal Range: Negative) pH: 6.0   (Normal  Range: 5.0-8.0) Protein: 30   (Normal Range: Negative) Urobilinogen: 0.2   (Normal Range: 0-1) Nitrite: positive   (Normal Range: Negative) Leukocyte Esterace: large   (Normal Range: Negative)  Urine Microscopic WBC/hpf: 20+ RBC/hpf: occ Bacteria: 3+ Epithelial: rare    Comments: ...................................................................DONNA [facility]  April 12, 2007 4:01 PM

## 2010-04-08 NOTE — Miscellaneous (Signed)
  Clinical Lists Changes  Orders: Added new Referral order of Gastroenterology Referral (GI) - Signed

## 2010-04-14 NOTE — Progress Notes (Signed)
Summary: refill  Phone Note Refill Request Call back at Home Phone [phone] Message from:  Patient  Refills Requested: Medication #1:  ADVAIR DISKUS 250-50 MCG/DOSE  MISC 1 puff in the morning and in the evening   Supply Requested: 6 months Initial call taken by: Audie Clear,  April 06, 2010 3:02 PM  Follow-up for Phone Call        Has been sent in. Follow-up by: Vickie Epley MD,  April 06, 2010 3:59 PM    Prescriptions: ADVAIR DISKUS 250-50 MCG/DOSE  MISC (FLUTICASONE-SALMETEROL) 1 puff in the morning and in the evening  #1 x 5   Entered and Authorized by:   Vickie Epley MD   Signed by:   Vickie Epley MD on 04/06/2010   Method used:   Electronically to        [facility]. [id number]* (retail)       [address]       [place], [place]  [address]       Ph: [phone] or [phone]       Fax: [phone]   RxID:   [id number]

## 2010-04-19 ENCOUNTER — Encounter: Payer: Self-pay | Admitting: *Deleted

## 2010-05-03 ENCOUNTER — Other Ambulatory Visit: Payer: Self-pay | Admitting: *Deleted

## 2010-05-03 MED ORDER — ALLOPURINOL 300 MG PO TABS: 300.0000 mg | ORAL_TABLET | Freq: Every day | ORAL | Status: DC

## 2010-05-03 NOTE — Telephone Encounter (Signed)
recieved refill request for Allopurinol from  pharmacy .  Sent in one month supply. Spoke with patient and advised to call for appointment before next refill is needed.

## 2010-05-07 ENCOUNTER — Ambulatory Visit (INDEPENDENT_AMBULATORY_CARE_PROVIDER_SITE_OTHER): Payer: Medicare Other | Admitting: Family Medicine

## 2010-05-07 ENCOUNTER — Encounter: Payer: Self-pay | Admitting: Family Medicine

## 2010-05-07 DIAGNOSIS — I10 Essential (primary) hypertension: Secondary | ICD-10-CM

## 2010-05-07 DIAGNOSIS — F528 Other sexual dysfunction not due to a substance or known physiological condition: Secondary | ICD-10-CM

## 2010-05-07 MED ORDER — TADALAFIL 10 MG PO TABS: 10.0000 mg | ORAL_TABLET | ORAL | Status: DC | PRN

## 2010-05-07 MED ORDER — LISINOPRIL 20 MG PO TABS: 20.0000 mg | ORAL_TABLET | Freq: Every day | ORAL | Status: DC

## 2010-05-07 NOTE — Patient Instructions (Signed)
It was great to see you today! I would like you to come back in about a week for some bloodwork. I am sending in prescriptions for both Cialis and Lisinopril (a medicine for blood pressure).

## 2010-05-10 NOTE — Assessment & Plan Note (Signed)
Has been high at two consecutive visits.  Will start on lisinopril, due to concerns about sexual function, and check a BMET.  Will follow up in 2-3 months.  Pt is not very interested in following up sooner.

## 2010-05-10 NOTE — Assessment & Plan Note (Signed)
Will give patient a trial of Cialis per his request.  Discussed risks and benefits as well as how to use appropriately.

## 2010-05-10 NOTE — Progress Notes (Signed)
S: Patient presents for routine followup.  Reports that he has continued to have problems with his sexual function.  Never got the PostVac due to price concerns.  Also interested in trying cialis.  O: Filed Vitals:   05/07/10 1614  BP: 174/98  Pulse: 80  Temp: 98.1 F (36.7 C)   GEN: NAD CV: RRR RESP: CTABL, decreased EXT: Decreased pulses, lack of hair, no obvious ulcerations.  Strength and sensation intact

## 2010-05-17 ENCOUNTER — Other Ambulatory Visit: Payer: Self-pay | Admitting: Family Medicine

## 2010-05-17 DIAGNOSIS — J45909 Unspecified asthma, uncomplicated: Secondary | ICD-10-CM

## 2010-05-17 MED ORDER — ALLOPURINOL 300 MG PO TABS: 300.0000 mg | ORAL_TABLET | Freq: Every day | ORAL | Status: DC

## 2010-05-17 MED ORDER — FLUTICASONE-SALMETEROL 250-50 MCG/DOSE IN AEPB: 1.0000 | INHALATION_SPRAY | Freq: Two times a day (BID) | RESPIRATORY_TRACT | Status: DC

## 2010-05-19 ENCOUNTER — Telehealth: Payer: Self-pay | Admitting: Family Medicine

## 2010-05-19 NOTE — Telephone Encounter (Signed)
Patient began taking Lisinorpril about a week ago and now c/o having no energy and basically not feeling well.  Advised him to stop taking the medication and that I would contact Dr. Jess Barters in the event he would like to prescribe anther type of antihypertensive.  Told him Dr. Jess Barters would be in touch.

## 2010-05-19 NOTE — Telephone Encounter (Signed)
Pt says new bp med is making him sick, scheduled an appt with pcp on mon 3/19 to discuss but pt says he is going to stop taking it, please advise

## 2010-05-19 NOTE — Telephone Encounter (Signed)
Discussed with patient that symptoms are somewhat atypical for an ACE reaction/intolerance.  Agree with not taking medicine today.  Also advised not to take tomorrow and, over the weekend, to start taking 1/2 tablet daily.  If symptoms return, will talk about switching to ARB at appointment on Monday.

## 2010-05-24 ENCOUNTER — Ambulatory Visit (INDEPENDENT_AMBULATORY_CARE_PROVIDER_SITE_OTHER): Payer: Medicare Other | Admitting: Family Medicine

## 2010-05-24 ENCOUNTER — Encounter: Payer: Self-pay | Admitting: Family Medicine

## 2010-05-24 VITALS — BP 148/89 | HR 73 | Temp 98.2°F | Ht 71.0 in | Wt 189.8 lb

## 2010-05-24 DIAGNOSIS — F528 Other sexual dysfunction not due to a substance or known physiological condition: Secondary | ICD-10-CM

## 2010-05-24 DIAGNOSIS — M549 Dorsalgia, unspecified: Secondary | ICD-10-CM

## 2010-05-24 MED ORDER — KETOROLAC TROMETHAMINE 30 MG/ML IJ SOLN: 60.0000 mg | Freq: Once | INTRAMUSCULAR | Status: DC

## 2010-05-24 NOTE — Progress Notes (Signed)
Subjective: Pt presents today for f/u of bp meds.  Reports that, since phone conversation, stopped taking lisinopril and symptoms got better, although he may still have had cough.  Restarted 1/2 pill of lisinopril three days ago and, since then, has not had a return of his symptoms, although his cough has persisted.  Pt does report that he has started having problems with his allergies about a week ago and has been having significant congestion/rhinorrhea.  No significant problems breathing/facial swelling/muscle weakness.  Objective:  Filed Vitals:   05/24/10 0850  BP: 148/89  Pulse: 73  Temp: 98.2 F (36.8 C)   Gen: NAD, alert, cooperative with exam HEENT: MMM, EOMI, PERRL, pharynx demonstrates coblestoning with obvious post-nasal drip and is mildly erythematous. CV: RRR, no MRG Resp: CTABL, no wheezes noted

## 2010-05-24 NOTE — Patient Instructions (Addendum)
It was great to see you today! I am encouraged that your symptoms have not really returned since starting back at half a pill daily.  I think that your cough is most likely related to allergies, which have been really acting up in a lot of people over the past week or two.  Continue to take 1/2 pill of the lisinopril daily and we'll check back in about 6 weeks from now.  Let me know if the cough gets worse or if you decide you need to switch to the other medicine we talked about (losartan).

## 2010-05-26 ENCOUNTER — Encounter: Payer: Self-pay | Admitting: Family Medicine

## 2010-06-29 ENCOUNTER — Ambulatory Visit (INDEPENDENT_AMBULATORY_CARE_PROVIDER_SITE_OTHER): Payer: Medicare Other | Admitting: Family Medicine

## 2010-06-29 ENCOUNTER — Encounter: Payer: Self-pay | Admitting: Family Medicine

## 2010-06-29 VITALS — BP 152/94 | HR 85 | Temp 97.4°F | Ht 72.0 in | Wt 189.0 lb

## 2010-06-29 DIAGNOSIS — I10 Essential (primary) hypertension: Secondary | ICD-10-CM

## 2010-06-29 MED ORDER — LOSARTAN POTASSIUM 25 MG PO TABS: 25.0000 mg | ORAL_TABLET | Freq: Every day | ORAL | Status: DC

## 2010-06-29 NOTE — Patient Instructions (Addendum)
It was great to see you today! Come back in two weeks for some blood work. I'm starting you on a low dose of the medicine Losartan. I would like to see you back in about 4 weeks.

## 2010-06-29 NOTE — Assessment & Plan Note (Signed)
The patient's hypertension is not controlled at this point. Goal for him would be systolic less than [id number]. Will start losartan today at a low dose, check labs in two weeks, and see the patient back in one month. At that point in time I would expect to increase his dose of losartan to better control his blood pressure. As I have previously noted, diuretics are relatively contraindicated in him due to his diagnosis of gout, and beta blockers are relatively contraindicated in him due to his problems with sexual dysfunction.

## 2010-06-29 NOTE — Progress Notes (Signed)
Subjective: The patient presents today for follow-up of his high blood pressure. He reports that he has been taking 10 mg of lisinopril daily, but has not noticed significant decreases in his blood pressure when checked at home. Due to his problems with headaches and cough when at the 20 mg dose, he is not interested in continuing this medication. The patient is complaining of a headache today, but since dropping down from 20 mg to 10 mg, has not had significant problems with nausea, vomiting, diarrhea, headaches, visual changes, or other concerning symptoms. He expresses understanding of the risks inherent in continuing to have blood pressure run as high as his is, and is amenable to trying a different medication.  Objective: Filed Vitals:   06/29/10 1630  BP: 152/94  Pulse: 85  Temp: 97.4 F (36.3 C)   general: alert, cooperative, no acute distress vital signs reviewed and discussed with patient cardiovascular: regular rate and rhythm respiratory: no increased work of breathing

## 2010-07-12 ENCOUNTER — Encounter: Payer: Self-pay | Admitting: Home Health Services

## 2010-07-16 ENCOUNTER — Other Ambulatory Visit: Payer: Medicare Other

## 2010-07-16 DIAGNOSIS — I10 Essential (primary) hypertension: Secondary | ICD-10-CM

## 2010-07-16 LAB — BASIC METABOLIC PANEL
BUN: 13 mg/dL (ref 6–23)
CO2: 22 mEq/L (ref 19–32)
Calcium: 9.1 mg/dL (ref 8.4–10.5)
Chloride: 105 mEq/L (ref 96–112)
Creat: 0.96 mg/dL (ref 0.40–1.50)
Glucose, Bld: 111 mg/dL — ABNORMAL HIGH (ref 70–99)
Potassium: 3.8 mEq/L (ref 3.5–5.3)
Sodium: 140 mEq/L (ref 135–145)

## 2010-07-16 NOTE — Progress Notes (Signed)
Bmp done today Marsheila Alejo 

## 2010-07-20 NOTE — Consult Note (Signed)
[name], [name]          ACCOUNT NO.:  [id number]   MEDICAL RECORD NO.:  [id number]          PATIENT TYPE:  INP   LOCATION:  5501                         FACILITY:  [facility]   PHYSICIAN:  Mathis Dad. Shadad        DATE OF BIRTH:  1933/04/26   DATE OF CONSULTATION:  DATE OF DISCHARGE:                                 CONSULTATION   CONSULTING PHYSICIAN:  [facility].   REASON FOR CONSULTATION:  Monoclonal protein.   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  He does have  a chronic history of hypertension as well as gout and for the most part  relatively healthy without many comorbid conditions.  The patient has  been doing relatively well until the early part of September, where he  lost his son who was in his 59s for diabetic complications and since  that time, he has had a lot of ailments that might or might not be  related.  He was initially hospitalized in the early parts of September  where he presented with abdominal pain and found to have acute  cholecystitis, and at that time he felt that the safest option is to  have a percutaneous cholecystectomy tube and take the gallbladder at a  later date.  During his hospitalization he presented with a normal  creatinine of 1.5, however during his hospitalization developed rising  creatinine up to 3.9.  The renal service was consulted and felt that  there may be an element of ATN and dehydration.  He had a workup for  myeloma, which showed the following finding:  He had a monoclonal  protein on his SPEP measuring 0.5 gm/dL.  His quantitative  immunoglobulins are normal.  His immunofixation showed that he has an  IgG lambda and IgM lambda.  His urine protein electrophoresis showed a  positive monoclonal free kappa and a polyclonal free lambda.  Those  results came after the patient's discharge, which happened on September  16.  However, the patient was presented again on October 6 for vague  complaints of generalized abdominal pain, failure to thrive,  hypotension, and found to have left knee swelling. probably acute gouty  flare, with a uric acid of 15.4, a white cell count of 14.8, and his  creatinine again up to 4.32.  The patient was treated with aggressive  hydration with the creatinine nearly normalizing, going back to 2.07.  He had a CT scan of the abdomen and pelvis, which showed an  indeterminate, nonspecific T12 sclerotic lesion.  His x-rays of the  skull as well as the T-spine did not really show any evidence of any  lytic lesions, and the patient had apparently a steroid injection of his  left knee in the orthopedic surgery and seems to be doing a whole lot  better.  Clinically he is slightly improving.  He is eating a little  better, his mobility is a little better and, as mentioned, his  creatinine has  improved.  I was asked to comment on his monoclonal  protein for a possible rule-out myeloma.  Otherwise clinically [name] does not report any abdominal pain right now and does not  have any back pain or shoulder pain.  He reports that his appetite has  been poor but it was difficult to determine if that is a chronic issue  or related to the stress of the death of his son, also could be related  to his acute cholecystitis ailments, but before that did not report any  constitutional symptoms.   REVIEW OF SYSTEMS:  Does not report headaches, blurry vision, double  vision.  Does not report any motor or sensory neuropathy, alteration of  mental status, psychiatric issues, depression.  Does not report any  fevers, chills, night sweats.  Does report some weight loss and lack of  appetite and decrease in his performance status.  Does not report any  chest pain, orthopnea, or PND.  Does not report any cough, hemoptysis,   hematemesis.  He does report some occasional dyspnea on exertion.  Does  not report any nausea or vomiting.  Does report some abdominal pain and  cramping.  Does not report any diarrhea or constipation.  Does not  report any genitourinary complaints.  Does not report any  musculoskeletal complaints.  Does report left knee pain as mentioned  from his gouty flare.  No bleeding or clotting diathesis.  No  neuropathy.  The rest of the review of systems is unremarkable.   PAST MEDICAL HISTORY:  Significant for hypertension as well as a gout as  well as COPD versus asthma.   His home medication upon his discharge from the hospital showed  metoprolol, Asmanex inhaler. Colchicine, Percocet, albuterol and  Atrovent.   ALLERGIES:  None.   FAMILY HISTORY:  Negative for any malignant disorders.   SOCIAL HISTORY:  He is married.  He is number of children and  stepchildren.  No alcohol or tobacco use.  He is retired from [employer].  Served between the [place] and the [place] War.  Currently  retired.  Has slight dependence on his wife for activity of daily  living.  Has remote tobacco and alcohol use.   PHYSICAL EXAM:  Alert, awake, chronically ill appearing-gentleman who  was not in acute distress.  His vitals upon my evaluation showed a blood pressure of 98/58, pulse  82, respirations 18, saturating at 95% on room air.  Head is normocephalic, atraumatic.  Pupils equal and round, reactive to  light.  Oral  mucosa is moist and pink.  NECK:  Supple, no lymphadenopathy.  HEART:  Regular rate and rhythm, S1 and S2.  LUNGS:  Clear auscultation, no rhonchi, wheeze or dullness to  percussion.  ABDOMEN:  Soft, nontender, no hepatosplenomegaly.  EXTREMITIES:  No edema.  Slight effusion and swelling of his left knee but otherwise his  musculoskeletal examination was unremarkable.   LABORATORY DATA:  Creatinine of 2.07, potassium 4.3.  A PSA of 1.3.  Liver function tests showed a bilirubin 0.8,  alkaline phosphatase of  184, AST 40, ALT of 98, calcium of 8.9, total protein 7.3, albumin of  2.7.  His CBC showed a hemoglobin of 9.7, a white cell count of 12.1,  platelet count of 270.   Radiographic studies were reviewed today.  His CT scan abdomen and  pelvis showed that he had a stable pigtail catheter in the gallbladder  tumor.  There is a nonspecific sclerotic density  within T12.  CT scan of  the pelvis was no acute findings.  Thoracic spine, lumbar spine showed  no evidence of any sclerotic or lytic lesion.  His skull x-rays were  negative.  His knee x-rays showed mild degenerative changes and joint  effusion, no bony abnormality.  Ultrasound of the abdomen showed no  intrahepatic biliary dilatation.   IMPRESSION:  This is a 77 year old gentleman following issues:   1. Recent diagnosis of acute cholecystitis and percutaneous      cholecystectomy.  2. He has acute gouty flare.  3. Failure to thrive.  4. Worsening chronic renal insufficiency, now resolving, chronic      anemia, probably multifactorial, as well as a mixture of polyclonal      and monoclonal protein in his blood and urine.  His serum protein      electrophoresis indicated a monoclonal protein count for 0.15 g/dL.      His immunofixation showed a mixture of IgG lambda and IgM lambda.      His urine protein electrophoresis showed predominantly free kappa      light chain.  Overall, my assessment of his plasma cell disorder      indicating no clear-cut evidence of myeloma.  I cannot really for      sure see there is any end-organ damage.  His renal insufficiency      could be a number things, could be acute tubular necrosis, could be      dehydration, and there is a clear evidence of resolution with      aggressive IV hydration.  So is his anemia.  I think his anemia is,      again, multifactorial, anemia of chronic disease as well as gout as      well as his multiple prolonged illness in the last month or so       could be contributing and also, this could be also signs of maybe a      light chain myeloma.   So to work this up, my recommendation would be at this point I will  check serum light chains in the serum.  That is a more sensitive test to  pick up any light chain increase.  Will also obtain a skeletal survey.  If those are both negative tests and do not indicate myeloma, then I  think we are dealing with probable MGUS with multiple other comorbid  conditions causing his chronic renal insufficiency, anemia.  If they are  positive, then he might need a bone marrow biopsy to assess a plasma  cell disorder and, more specifically, myeloma.  Certainly, if he  improves in the next few days we will follow up with him on an  outpatient basis and we can complete workup as an outpatient.   Thank you for this consultation.  We will follow Mr. Madrazo with  you.           ______________________________  Mathis Dad. [facility]  Electronically Signed     FNS/MEDQ  D:  12/13/2006  T:  12/14/2006  Job:  [id number]   cc:   Sherril Croon, M.D.

## 2010-07-20 NOTE — Discharge Summary (Signed)
[name], [name]          ACCOUNT NO.:  [id number]   MEDICAL RECORD NO.:  [id number]          PATIENT TYPE:  OIB   LOCATION:  5151                         FACILITY:  [facility]   PHYSICIAN:  Marcello Moores A. Cornett, M.D.DATE OF BIRTH:  1933-09-30   DATE OF ADMISSION:  04/04/2007  DATE OF DISCHARGE:  04/05/2007                               DISCHARGE SUMMARY   ADMITTING DIAGNOSIS:  Chronic cholecystitis.   DISCHARGE DIAGNOSIS:  Chronic cholecystitis.   PROCEDURE PERFORMED:  Laparoscopic cholecystectomy with cholangiogram.   BRIEF HISTORY:  The patient is a 77 year old male who was seen back in  our office due to chronic cholecystitis.  A percutaneous cholecystostomy  tube was placed.  He had severe protein calori malnutrition and poor  medical management of his medical issues and was tuned up by his primary  care doctor.  He presented for elective laparoscopic cholecystectomy and  removal of his cholecystostomy tube on the 28th of January, 2009.   HOSPITAL COURSE:  Unremarkable.  He was sore on postop day 1, had some  bruising in his umbilicus but otherwise vital signs were stable,  tolerating diet and he wished to go home.  He was sent home in improved  condition.   DISCHARGE INSTRUCTIONS:  I will see him back in 3 weeks.  He will take  Percocet 1-2 tablets every 4 to 6 hours as needed for pain and follow up  with his primary care doctor.   CONDITION ON DISCHARGE:  Improved.      Thomas A. Cornett, M.D.  Electronically Signed     TAC/MEDQ  D:  04/05/2007  T:  04/05/2007  Job:  [id number]

## 2010-07-20 NOTE — Consult Note (Signed)
[name], [name]          ACCOUNT NO.:  [id number]   MEDICAL RECORD NO.:  [id number]          PATIENT TYPE:  EMS   LOCATION:  ED                           [facility]ACILITY:  [facility]   PHYSICIAN:  Joyice Faster. Cornett, M.D.DATE OF BIRTH:  1933/07/27   DATE OF CONSULTATION:  11/13/2006  DATE OF DISCHARGE:                                 CONSULTATION   REQUESTING PHYSICIAN:  Dr. Nat Christen.   REASON FOR CONSULTATION:  Abdominal pain.   HISTORY OF PRESENT ILLNESS:  The patient is a 77 year old male with four  to five-day history of right upper quadrant pain, nausea, vomiting.  Pain has been on a scale of 5/10.  It has been severe of the weekend and  started Thursday or Friday he states and has progressed throughout the  weekend.  The pain is severe, located in his right upper quadrant  without radiation.  It hurts for him to take a deep breath in his right  upper quadrant.  He denies any change in the color of his stool or  urine.  He has history of ulcer disease but had no vomiting of blood or  back pain.  I was asked to see the patient at the request of Dr. Lacinda Axon  for this.   PAST MEDICAL HISTORY:  1. COPD.  2. Essential hypertension.   FAMILY HISTORY:  Noncontributory.   SOCIAL HISTORY:  Denies tobacco or alcohol use currently.  He is with  his family tonight.   ALLERGIES:  NO KNOWN DRUG ALLERGIES.   MEDICATIONS:  Include Atrovent, hydrochlorothiazide, indomethacin and  metoprolol.   REVIEW OF SYSTEMS:  As that stated above, otherwise negative x15.   PAST SURGICAL HISTORY:  None.   PHYSICAL EXAMINATION:  VITAL SIGNS:  Temperature 99, pulse 128, blood  pressure is 122/76, respiratory rate is 22.  GENERAL APPEARANCE:  Pleasant, thin, somewhat cachectic-appearing male  in no apparent distress.  HEENT:  No evidence of scleral icterus.  Mucous membranes are dry.  NECK:  Supple.  Trachea midline.  No mass.  CHEST:  Chest wall motion is normal.  Lungs are clear to auscultation  bilaterally.  CARDIOVASCULAR:  Tachycardia noted.  No rub, murmur or gallop.  Extremities are warm and well-perfused.  ABDOMEN:  He has a tender palpable gallbladder in his right upper  quadrant with positive Murphy's sign.  Slight abdominal distention  noted.  No hernia.  EXTREMITIES:  Muscle tone is somewhat decreased.  Range of motion is  grossly normal.  NEUROLOGIC:  Alert and oriented x4.  Glasgow Coma Scale is 15.  Motor  and sensory function are grossly intact.   DIAGNOSTIC STUDIES:  I reviewed his abdominal and pelvic CT.  He has an  extremely thick wall gallbladder which looks to be about a centimeter in  thickness with gallstones consistent with acute on chronic  cholecystitis.  No free air noted.   LABORATORY STUDIES:  White count of 26,000, hemoglobin 12, platelet  count 230,000.  Lipase and amylase appear normal.  Electrolytes are  within normal limits except for creatinine 1.59, BUN 23, glucose 127,  sodium 137, potassium 4.2, chloride 103.  Liver functions are within  normal limits.  Cardiac enzymes within normal limits.   IMPRESSION:  1. Acute on chronic cholecystitis.  2. Essential hypertension.  3. History of chronic obstructive pulmonary disease.   PLAN:  I have reviewed his CT and examined the patient.  He has a  palpable gallbladder.  Given that he has already had four days of  symptoms, the likelihood of this being removed laparoscopic, in my  opinion, is less than 30%.  He does appear to be somewhat ill from this  and I feel IV antibiotics, n.p.o. status and percutaneous drainage would  be more appropriate with delayed cholecystectomy to increase his chances  of being able to remove this laparoscopically, which I think he will  tolerate better.  If we have to operate now, more than likely this will  be an open procedure and risk of common duct injury go up as well as  injury to adjacent organs given the appearance of his gallbladder by CT.  Therefore, I  have talked with his family and him about percutaneous  drainage and interval cholecystectomy with IV antibiotics and they seem  to understand this and are agreeing to proceed which I feel is  appropriate in this setting given the fact that he has had pain for more  than four days and the appearance of his gallbladder by CT and the fact  that is palpable on physical examination.  He will be admitted, IV  antibiotics including Cipro and IV fluids will be administered.  I will  have medicine see him to help manage his medical issues and ask  interventional radiology in the morning for consideration of  percutaneous cholecystostomy tube at this point.  The patient  understands and agrees to proceed.  He will be placed in step-down  overnight.      Thomas A. Cornett, M.D.  Electronically Signed     TAC/MEDQ  D:  11/13/2006  T:  11/14/2006  Job:  [id number]

## 2010-07-20 NOTE — Discharge Summary (Signed)
[name], [name]          ACCOUNT NO.:  [id number]   MEDICAL RECORD NO.:  [id number]          PATIENT TYPE:  INP   LOCATION:  4736                         FACILITY:  [facility]   PHYSICIAN:  Domingo Cocking. Jimmye Norman, M.D.DATE OF BIRTH:  [birth year]   DATE OF ADMISSION:  01/08/2007  DATE OF DISCHARGE:  01/12/2007                               DISCHARGE SUMMARY   PRIMARY CARE PHYSICIAN:  Is now [name] with [facility]   DISCHARGE DIAGNOSES:  1. Status post acute cholecystitis with colostomy drain placement.  2. Gout.  3. Asthma/chronic obstructive pulmonary disease.  4. Hypertension.  5. Acute renal failure.  6. Anemia.  7. Malnutrition.   DISCHARGE MEDICATIONS:  1. Colchicine 0.6 mg by mouth b.i.d.  2. Iron sulfate 325 mg one tablet by mouth b.i.d.  3. Flovent HFA two puffs twice a day.  4. Atrovent two puffs twice a day.  5. Percocet 5/325 one tablet every four hours as needed for pain.  6. Megace ES 625 mg by mouth daily.  7. K-Dur 20 mEq one tablet t.i.d. for the next two days.   CONSULTS:  1. Social work.  2. Nutrition.  3. Cardiology.   PROCEDURES:  Patient had two separate transfusions with packed red blood  cells, one was two units, the other was one unit.   LABS:  Initial hemoglobin 8.6 upon admission, did drop down to 6.7 and  after three units, patient's discharge hemoglobin was 11.  Potassium 3.2  upon discharge.  Iron level was 12, total iron-binding capacity was 157,  percent saturation was 8, B12 was 253 on discharge.  Creatinine was  initially 2.86, was 1.11 upon discharge.   HOSPITAL COURSE:  [name] is a 77 year old male that was sent  over from urgent care center with concern for hypotension and  tachycardia in the midst of acute gout flare.  He was found to have  chronic cholecystitis with colostomy tube placed in September of 2008  with underlying anemia and malnutrition and acute renal failure upon  admission.   PROBLEM:  1. Hypotension/tachycardia.  This improved with IV fluids, essentially      was resolved after three units of packed red blood cells.      Patient's pulse was initially in the 120s/130s and after his      transfusions decreased down to the 70s and 80s with his blood      pressures upon discharge remaining systolic in the [id number]      to 90s and pulse 70s to 80s.  2. Anemia.  He was found to have a hemoglobin of 6.7 on November 5 and      underwent two transfusions of three units total and his hemoglobin      on date of discharge was 11.  His hemo-status improved and remained      stable for greater than 48 hours.  It was also found that his      anemia was likely secondary to iron deficiency anemia with      microcytic values.  His Hemoccults were negative x2 although we  will setup GI consult as an outpatient to follow up with a      colonoscopy and endoscopy.  3. Status post acute cholecystitis.  Patient is followed by Dr.      Brantley Stage as an outpatient surgery patient.  He initially had a      colostomy drain placed in September of 2008 with presumptive      cholecystectomy scheduled for January 15, 2007.  Dr. Brantley Stage after      a lengthy discussion decided that he did not want to pursue with      the surgery at this time with the patient's instability      hemodynamically and with his malnutrition.  Therefore, Dr. Brantley Stage      will get in touch with him about future dates of surgery after he      becomes more stabilized medically.  4. Acute gouty flare.  The patient was in much pain upon admission.      Patient was treated with 0.6 mg q.6 hours with colchicine and      symptoms improved.  Patient's gouty flare had resolved on day of      discharge.  We will decrease him to his maintenance dose of 0.6      b.i.d. and follow up in the clinic.  5. Malnutrition.  Patient has had problems with his nutritional intake      during his hospital course and prior to his  hospital course.  It      has been explained to him that this is a deciding and integral      factor in determining his surgical status and he understands that      now.  We will provide him with appetite stimulant, Megace, upon      discharge in hopes of increasing his nutrition status.  He was also      seen by nutritional consult who gave their recommendations.  6. COPD/asthma.  At this point, it is unclear whether it is truly COPD      or truly asthma.  Will follow up in the outpatient clinic.  7. Acute renal failure.  Patient initially had an elevated creatinine;      seemed to be prerenal in nature with hypovolemia and dehydration as      the instigating factor.  Patient underwent aggressive resuscitation      with fluids and responded appropriately.  Creatinine has come down      and has been within normal limits for the past three days.  We will      continue to follow his creatinine as needed as an outpatient.  8. History of hypertension.  He was hypotensive upon admission and his      hypertensive medication was held throughout his hospital course and      will continue to hold until he is seen in the clinic again.   Patient's condition is good.   DISCHARGE INSTRUCTIONS:  1. Patient is to call his primary care doctor or return to the ED if      he notices any blood or blood in his urine or stool, or if he      suddenly feels weak with shortness of breath and headaches and      dizziness, also if he should develop a temperature of greater than      101, or if his drain site becomes red, swollen and painful.  2. Increase the amount of food and water intake.  3. Scheduled appointment for his GI specialist will be performed next      week after his clinic visit at [facility]   FOLLOWUP:  Patient has a scheduled appointment with Dr. Jeannine Kitten at [facility] on Monday, January 15, 2007, at 4:00 p.m.  After that appointment, nursing staff will  schedule a GI appointment  with Dr. Juanita Craver at [phone].  That appointment needs to be made in  reference to an outpatient colonoscopy/endoscopy for a thorough GI  workup.      [name], MD  Electronically Signed      Domingo Cocking. Jimmye Norman, M.D.  Electronically Signed    KL/MEDQ  D:  01/12/2007  T:  01/12/2007  Job:  [id number]   cc:   Graciella Belton, MD  [name], MD  Joyice Faster. Cornett, M.D.

## 2010-07-20 NOTE — Consult Note (Signed)
[name], [name]          ACCOUNT NO.:  [id number]   MEDICAL RECORD NO.:  [id number]          PATIENT TYPE:  INP   LOCATION:  [id number]                         FACILITY:  [facility]   PHYSICIAN:  Sherril Croon, M.D.   DATE OF BIRTH:  Jun 27, 1933   DATE OF CONSULTATION:  11/20/2006  DATE OF DISCHARGE:                                 CONSULTATION   REASON FOR CONSULTATION:  Elevated serum creatinine.   HISTORY OF PRESENT ILLNESS:  This is a 77 year old African/American  male, status post cholecystostomy on November 14, 2006, for acute on  chronic cholecystitis.  He was admitted on November 13, 2006.  He had a  progressive increase in his serum creatinine post-cholecystostomy.  He  had no intravenous contrast.  There was a history of non-steroidal anti-  inflammatory drug use.  An episode of gout in his left knee on November 17, 2006.  He was treated with one dose of Motrin on November 14, 2006,  and had received intravenous antibiotic therapy from November 16, 2006,  to November 20, 2006, including intravenous Cipro on November 13, 2006.  Discontinued on November 16, 2006.  Creatinine increased from 1.5 on  November 16, 2006, to 2 on September 12th, to 2.6 on September 13th, to  3.2 on September 14th, to 3.7 on September 15th, to 3.9 on November 21, 2006.  His urine output has remained good.  He is non-oliguric, with  positive balance by 5 liters.  Diarrhea started on November 17, 2006,  and he has an ileus.   PAST MEDICAL HISTORY:  1. Hypertension.  2. Chronic obstructive pulmonary disease.  3. Gout.   MEDICATIONS:  1. Pulmicort two puffs twice daily.  2. Diflucan 100 mg q.24 h.  3. Lopressor 25 mg twice daily.  4. Protonix 40 mg q.12 h.  5. Spiriva inhaler one puff daily.  6. Albuterol two puffs q.4 h.   SOCIAL HISTORY:  Is married.  A history of tobacco, 20-pack-years.  Has  a history of occasional alcohol.   FAMILY HISTORY:  Noncontributory.   REVIEW OF  SYSTEMS:  GENERAL:  He complains of weakness but no fever or  chills since November 18, 2006.  EYES:  Wears glasses.  No visual  complaints.  EARS/NOSE/MOUTH/THROAT:  No sore throat.  No sinusitis.  No  dental pain.  CARDIOVASCULAR:  Denies chest pain.  A 2-D echocardiogram  revealed an ejection fraction of 55%.  No syncope.  No ankle or leg  swelling.  RESPIRATORY:  No cough, no wheeze.  Has dyspnea on  conversation and on mild exertion.  ABDOMEN:  No abdominal pain.  A  cholecystostomy tube in place.  Loose bowel movements x3.  No vomiting.  GENITOURINARY:  A Foley catheter to straight drain.  MUSCULOSKELETAL:  Left knee pain, no effusion.  No current non-steroidal anti-inflammatory  drugs.  DERMATOLOGY:  No skin rashes or itching.   DIET:  A soft mechanical diet.   PHYSICAL EXAMINATION:  GENERAL:  Alert and resting comfortably.  He is  oriented x3.  VITAL SIGNS:  Blood pressure 130/70, pulse 98, temperature afebrile.  HEENT:  Normocephalic and atraumatic with pupils reactive.  No icterus  or pallor.  Ears, nose, mouth, throat clear.  Normal external  appearance.  Oropharynx clear.  NECK:  JVP not elevated.  No carotid bruits.  No thyromegaly or  adenopathy.  CARDIOVASCULAR:  A regular rate and rhythm.  A 2/6 systolic murmur which  was soft.  LUNGS:  Fields are clear to auscultation.  Increased AP diameter to  percussion and resonant.  No rales.  ABDOMEN:  Distended with diminished bowel sounds, soft, untoward.  GENITOURINARY:  Foley to straight drain.  EXTREMITIES:  Pulses symmetric with no edema.   LABORATORY DATA:  Bile grew out Candida albicans.  Sodium 141, potassium  3.4, chloride 112, CO2 of 16, BUN 48, creatinine 3.9, glucose 107.  Albumin 2.2, calcium 9.1, phosphorus 3.8, corrected calcium 10.5.  PTH  136 with a calcium __________  time of 7.9.  WBC 20.9, hemoglobin 11.4,  platelets 464.  Fecal blood positive.   Ultrasound:  No hydronephrosis.   ASSESSMENT/PLAN:  1.  Acute renal failure, most likely secondary to acute tubular      necrosis with some tachycardia and relative hypotension on      November 17, 2006:  Acute interstitial nephritis remains a      possibility as he received Cipro on November 13, 2006, to November 17, 2006.  __________  oliguric with creatinine decreasing.  Will      hopefully continue to improve.  No contrast nephrotoxins.  He did      get one dose of Motrin.  No stigmata of atheroemboli.  Will      continue to follow the serum creatinine and check a urinalysis and      urine microscopy.  His volume appears to have been a positive      balance by four or five liters.  Doubt intravascular volume      expanded.  Does have an ileus.  2. Bones:  Parathyroid hormone slightly increased.  Mild __________      at baseline.  Some decrease in calcium, most likely secondary to      vitamin B deficiency.  Will continue to follow clinically.  Recheck      once stable.  3. Ileus:  This appears to be resolving third space.  4. Anemia:  Stable.  5. Mild metabolic acidosis:  Will change IV fluids to D-5-W with a 2      ounce spike, up to 60 mL an hour.      Sherril Croon, M.D.  Electronically Signed     MWW/MEDQ  D:  11/21/2006  T:  11/22/2006  Job:  [id number]

## 2010-07-20 NOTE — H&P (Signed)
[name], [name]          ACCOUNT NO.:  [id number]   MEDICAL RECORD NO.:  [id number]          PATIENT TYPE:  INP   LOCATION:  6703                         FACILITY:  [facility]   PHYSICIAN:  Dickie La, MD        DATE OF BIRTH:  [birth year]   DATE OF ADMISSION:  02/20/2007  DATE OF DISCHARGE:                              HISTORY & PHYSICAL   CHIEF COMPLAINT:  Abdominal pain, cholecystostomy not draining.   HISTORY OF PRESENT ILLNESS:  This 77 year old African-American male  presents with no drainage from his cholecystectomy x3 days and abdominal  pain x2 days.  This cholecystostomy was placed back in September 2008  secondary to acute on chronic cholecystitis at that time.  Since that  time the patient has still not had a cholecystostomy.  He had one  planned for January 15, 2007, with Dr. Brantley Stage but had two acute  sickness episodes associated with hypotension and at one point  Klebsiella infection which occurred in September and November.  It was  decided to postpone the surgery until all these issues had resolved.  He  had been relatively healthy through about the month of November and  early December until his cholecystostomy plugged and would not flush.  His abdominal pain started 1 day after his cholecystostomy would not  flush and was associated with some chills and sweats, but those have not  occurred today.  He has also had some generalized weakness and malaise  over the last 2 days that are very similar to previous symptoms for the  admission in September and November; however, not as severe.  He did  call Dr. Josetta Huddle office yesterday and apparently was told not to worry  about the fact that his cholecystostomy would not flush anymore.  He was  seen today by [facility] at which time they sent him to  the emergency department for further evaluation and possible  intervention to his plugged cholecystostomy tube.   REVIEW OF SYSTEMS:  Otherwise  negative.   PAST MEDICAL HISTORY:  1. Gout.  2. Chronic cholecystitis status post cholecystostomy.  3. COPD.  4. Hypertension.  5. Anemia in the process of workup by Dr. Penelope Coop with recent normal      EGD.  6. MGUS diagnosed September.  7. History of malnutrition.   FAMILY HISTORY:  Noncontributory.   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 Does  not smoke.   ALLERGIES:  No known drug allergies.   MEDICATIONS:  1. Colchicine 0.6 mg p.o. b.i.d.  2. Flovent 2 puffs twice daily.  3. Albuterol p.r.n.  4. Iron sulfate 325 mg twice daily.   PHYSICAL EXAMINATION:  VITAL SIGNS:  Temperature 99.0, pulse 105-145,  respirations 20, blood pressure 100 to 128/57 to 71, saturating 97% on  room air.  GENERAL:  No acute distress, alert and oriented x3.  HEENT:  Dentures in place.  Externally normal ears, nose lips.  NECK:  No neck mass,  CARDIOVASCULAR:  Patient is tachycardiac but has a regular rhythm.  No  murmurs.  PULMONARY:  Shows decreased breath  sounds throughout with no wheezes or  rhonchi.  ABDOMEN:  Shows mild to moderate right upper quadrant pain.  Mild  guarding on the right. No rebound. Negative right upper quadrant pain on  inspiration.  No hepatosplenomegaly.  Normoactive bowel sounds.  The  patient has normal-appearing cholecystostomy site that is nontender with  no erythema or exudate.  EXTREMITIES:  Show no cyanosis or edema.  NEUROLOGIC:  Cranial nerves II-XII are intact with normal movement and  sensation grossly bilateral upper and lower extremities.   White count 23.3, hemoglobin 12.4, hematocrit 37.6, platelets are  clumped but normal appearing and count. MCV was 79.1.  ANC was 82.8.  Sodium 139, potassium 4.4, chloride 108, bicarb 22, BUN 14, creatinine  1.44, baseline 1.11, glucose 110, total bilirubin 5.6, alkaline  phosphatase 176, AST 72, ALT 166, total protein 7.6, albumin 3.9,  calcium 8.7.   Abnormal ultrasound:  The catheter  is in place in the gallbladder fundus  with no overt gallbladder extension and no acute findings.   ASSESSMENT AND PLAN:  A 77 year old African-American male with  1. Questionable cholecystostomy site infection:  With increased white      count, tachycardia, increased abdominal pain, history of acutely      developing hypotension potentially related to this issue, and the      clogging of  his cholecystostomy, we will treat this with Cipro and      Flagyl as if it were infected, especially given history of the      Klebsiella that was sensitive to Cipro.  We will plan on consulting      surgery in the morning or if any acute illness develops.  2. Mild acute renal failure, creatinine of 1.44 from 1.11 at baseline:      This is likely prerenal.  We will hydrate and follow.  3. Increased white count:  See #1.  We will also check urine and blood      cultures and chest x-ray to rule out any other sources, although      does not seem to be symptomatic from some of these other possible      etiologies.  4. Tachycardia:  This is probably secondary to dehydration and      possible developing infection.  Will treat with IV fluids as above.  5. Gout:  Continue the colchicine, doubt acute flare currently.  6. Chronic obstructive pulmonary disease:  Continue the Flovent for      prevention.  If he becomes short of breath, consider adding      albuterol.  7. Prophylaxis:  The patient will be placed on a proton pump inhibitor      and encouraged to ambulate early.      Druscilla Brownie, M.D.  Electronically Signed      Dickie La, MD  Electronically Signed    AL/MEDQ  D:  02/20/2007  T:  02/21/2007  Job:  [id number]

## 2010-07-20 NOTE — Discharge Summary (Signed)
[name], [name]          ACCOUNT NO.:  [id number]   MEDICAL RECORD NO.:  [id number]          PATIENT TYPE:  INP   LOCATION:  1332                         FACILITY:  [facility]   PHYSICIAN:  Durwin Nora, MDDATE OF BIRTH:  08/14/1933   DATE OF ADMISSION:  11/13/2006  DATE OF DISCHARGE:                               DISCHARGE SUMMARY   DISCHARGE DIAGNOSES:  1. Acute cholecystitis.  2. Status post cholecystectomy.  3. Acute renal failure.  4. Gout.  5. Arthritis, left knee.  6. Normocytic anemia.  7. Ileus.  8. Chronic obstructive pulmonary disease.  9. Hypertension.  10.Leukocytosis.  11.Protein calorie malnutrition.  12.Iron deficiency.   CONSULTATIONS:  Dr. Brantley Stage, general surgery.  Dr. Justin Mend, nephrologist.  He is going to see the patient and recommend  further treatment.   HOSPITAL COURSE:  77 year old African-American male presenting with 5  days of right upper quadrant abdominal pain, nausea and vomiting.  At  presentation, he had a palpable gallbladder.  The surgeon, Dr. Brantley Stage,  was consulted by Dr. Rolena Infante, and the patient subsequently had  cholecystectomy, tube placed, and has been scheduled for cholecystectomy  4-6 weeks after in the interval.  His hypertension is stable at present.  COPD is also staying stable.  However, the patient has been having  increasing BUN and creatinine.  He was noted to have an acute flare-up  of his left knee gout/arthritis, and was restarted on low-dose cortisone  and allopurinol.  His indomethacin has been put on hold because of the  kidney dysfunction.  However, the creatinine is continually rising,  although the patient has no uremic symptoms.  His bile culture of  November 13, 2006 showed many WBC and  a few yeast with Candida  albicans.  The patient is on IV Diflucan for this.  His mildly elevated  LFTs are stabilizing.  The patient also did develop ileus and had been  placed on NG tube for decompression.  This is  slowly resolving and is on  total parenteral nutrition.  He was initially on IV Zosyn, vancomycin;  but this has been put on hold because of the renal dysfunction.  He  initially had diarrhea, however, the C diff test was negative.  He has  been noted to have recurrent hypokalemia which has been supplemented.  The initial hyponatremia has been supplemented with initially D5 half-  normal saline.  Currently condition is stable.   MEDICATIONS:  Will be dictated at final discharge.   RADIOLOGY:  The renal ultrasound of November 18, 2006 shows no evidence  for hydronephrosis or renal mass.  The chest x-ray on November 17, 2006  shows no pneumothorax.  CT chest on November 15, 2006 shows a  questionable 1-cm right upper lobe nodule and bibasilar atelectasis,  query infiltrate.  I think we will need serial CT chest  in the next 3-6  months to rule out any underlying neoplasia.  The CT abdomen/pelvis on  November 13, 2006 on admission shows cholelithiasis and acute  cholecystitis.   PHYSICAL EXAMINATION:  An elderly man noti in acute respiratory  distress.  He is pale but not  jaundiced.  Temperature 98, pulse 110, respiratory rate 20, blood pressure 111/77.Marland Kitchen  OROPHARYNX/NASOPHARYNX:  Clear.  No clubbing, no cyanosis.  HEAD:  Atraumatic, normocephalic.  NECK:  Supple.  Pupils equal, reactive to light and accommodation.  Sclerae are  anicteric.  Not jaundiced.  CHEST:  Clinically clear.  HEART:  Sounds 1 and 2 with tachycardia.  ABDOMEN:  Soft, mild distention.  There is no tenderness.  There are  reduced bowel sounds.  No organomegaly.  He is alert and oriented x3.  Minimal left knee swelling.  There is no  tenderness.  There is mild reduction of range of motion.  There is no  pedal edema.  Peripheral pulse is present.   LABORATORY DATA:  WBC 20, hematocrit 32, platelet count 464.  __________  sodium 141, potassium 3.4, chloride 112, bicarbonate 16, BUN 48,  creatinine 3.9, glucose  107.   PLAN:  Consider orthopedic intervention.  We will review the left knee x-  ray to rule out any effusion is collecting, as that could be  contributing to the leukocytosis.  Workup has been sent to rule out  underlying chronic kidney disease.  This is to be followed up.  The  parathyroid hormone, vitamin D, urine, serum protein electrophoresis,  ANA complements, P-ANCA, C-ANCA, and possibility of considering renal  biopsy if nephrologist is okay with this.  Monitor labs. We will get CBC  and chemistry in the morning.      Durwin Nora, MD  Electronically Signed     MIO/MEDQ  D:  11/21/2006  T:  11/21/2006  Job:  [id number]

## 2010-07-20 NOTE — Consult Note (Signed)
NAMEFILEMON, GIMLIN          ACCOUNT NO.:  [id number]   MEDICAL RECORD NO.:  [id number]          PATIENT TYPE:  INP   LOCATION:  0105                         [facility]ACILITY:  [facility]   PHYSICIAN:  Aquilla Hacker, M.D. DATE OF BIRTH:  [birth year]   DATE OF CONSULTATION:  DATE OF DISCHARGE:                                 CONSULTATION   PRIMARY CARE PHYSICIAN:  [place].   This is an Public librarian.   HISTORY OF PRESENT ILLNESS:  Mr. Gaede is a 77 year old gentleman  with a past medical history of hypertension and asthma who states that  this past Friday, which is 3 days ago, he developed abdominal pain.  He  states that his pain occurred Friday night shortly after he went to bed.  He woke up the next morning and his pain appeared to have resolved.  However some time later Saturday night, the pain returned and it lasted  all the way up until Sunday and is located on the right side of the  patient's abdomen.  He is very vague with regards to describing how the  pain feels but he says that it feels raw as if there was no stomach  lining present.  It comes and goes.  He has never had similar pain  before.  There is no radiation of the pain.  It is pretty localized to  the right side of his abdomen.  It reaches a 10/10 in intensity.  He  indicates that this morning he went to [facility] for evaluation and was  subsequently referred to [facility] Emergency Department for  evaluation.  He had one episode of emesis while within the ED.  He was  vague with regards to whether or not he has had any fevers or chills at  home.  He denies current chest pain or shortness of breath.   PAST MEDICAL HISTORY:  1. Asthma.  2. Hypertension.  3. Gout.  4. Arthritis.  5. No history of heart disease.   PAST SURGICAL HISTORY:  None.   ALLERGIES:  No known drug allergies.   HOME MEDICATIONS:  1. Metoprolol tartrate 50 mg p.o. b.i.d.  2. Hydrochlorothiazide 25 mg p.o.  daily.  3. Indomethacin 50 mg p.o. b.i.d.  4. Proventil MDI p.r.n.  5. Asthmanex 220 mcg.  6. Atrovent.   SOCIAL HISTORY:  Cigarettes:  The patient started smoking at the age of  26.  He smoked up to one pack of cigarettes per day for at least 20  years.  He now currently smoked only 1-2 sticks of cigarettes per day.  Alcohol:  The patient stopped drinking approximately 3-4 years ago.  He  was weekend drinker prior to that.   FAMILY HISTORY:  Mother died from asthma.  Father's medical history is  unknown.   PHYSICAL EXAMINATION:  GENERAL:  The patient is awake.  He is  cooperative.  He is in obvious respiratory distress.  VITAL SIGNS:  His temperature is 99.3, blood pressure 128/79, heart rate  135, respirations 22, O2 sat is 96%.  HEENT:  Atraumatic, normocephalic.  Muddy sclerae bilaterally.  Respirations are 22.  O2  sat 96% on room air.  Dentures are present.  NECK:  Supple.  No JVD.  No lymphadenopathy.  Thyroid is not palpable.  CARDIAC:  S1 S2 present, however, heart sounds are distant.  RESPIRATORY:  No crackles or wheezes.  ABDOMEN:  Scaphoid.  Positive bowel sounds x4 quadrants.  There is some  right mid and lower abdominal tenderness to palpation.  No guarding.  EXTREMITIES:  No leg edema.  NEUROLOGIC:  The patient is alert and oriented x3.  MUSCULOSKELETAL:  Five out of five upper and lower extremity strength.   LABORATORY:  The patient's white blood cell count is 26.6.  His  hemoglobin is 12, hematocrit 35.4, platelets 230.  Fecal occult blood is  negative.  Sodium is 135, potassium 4.2, chloride 103, CO2 21, glucose  127, BUN 23, creatinine is 1.59.  Bilirubin total 1.4, alkaline  phosphatase 77, SGOT 14, SGPT 11, total protein 7.9, albumin 3.6,  calcium 9.2.  CK-MB, point-of-care, less than 1.  Troponin I point-of-  care less than 0.05.  Myoglobin, point-of-care, 247.  Lipase 16.  The  patient had a CT scan of his abdomen which showed acute cholecystitis.  He also  had an EKG completed which revealed sinus tachycardia, possible  left atrial enlargement, no Q waves, no obvious ST segment  abnormalities.   ASSESSMENT/PLAN:  1. Acute cholecystitis.  General surgery has already evaluated the      patient.  Their note indicates that they plan to consult      interventional radiology in order to have a percutaneous      cholecystectomy tube placed.  Within the next 4-6 weeks they plan      to perform a laparoscopic cholecystectomy.  Further recommendations      will be per general surgery.  2. Hypertension.  This is currently stable.  3. Mild renal insufficiency.  There may be a prerenal component to      this secondary to dehydration resulting from the patient's emesis.      The patient currently is taking indomethacin, whether or not this      has contributed is questionable.  We will hold the patient's      indomethacin for now.  We will gently hydrate the patient.  We will      monitor him closely.  4. Mild transaminitis.  This is most likely secondary to the patient's      acute cholecystitis.  We will monitor closely.  5. History of asthma.  The patient currently denies having any      shortness of breath.  We will resume his previously prescribed home      medications.  6. Decreased exercise tolerance.  The patient does deny any chest pain      with exertion but states that at baseline he is only able to      ambulate 1 block secondary to having to stop to catch his breath.      Likewise, he also indicates that when he cuts the grass he stops      numerous times secondary to shortness of breath.  This may be      secondary to his history of asthma.  Likewise this may be secondary      to being deconditioned.  In light of the patient's planned surgery      within the next couple of weeks, we will order a 2D echocardiogram      to evaluate the patient's  cardiovascultar ejection fraction.  7. Tachycardia.  This may be secondary to the pain that  the patient is      currently in.  We will continue as needed      prescribed pain medications.  8. Gastrointestinal prophylaxis.  We will provide Protonix.  9. Deep vein thrombosis prophylaxis.  We will provide Lovenox.      Aquilla Hacker, M.D.  Electronically Signed     OR/MEDQ  D:  11/13/2006  T:  11/13/2006  Job:  [id number]

## 2010-07-20 NOTE — Op Note (Signed)
[name], [name]          ACCOUNT NO.:  [id number]   MEDICAL RECORD NO.:  [id number]          PATIENT TYPE:  OIB   LOCATION:  2550                         FACILITY:  [facility]   PHYSICIAN:  Marcello Moores A. Cornett, M.D.DATE OF BIRTH:  07-12-1933   DATE OF PROCEDURE:  04/04/2007  DATE OF DISCHARGE:                               OPERATIVE REPORT   PREOPERATIVE DIAGNOSIS:  Chronic cholecystitis with previously placed  cholecystostomy tube.   POSTOPERATIVE DIAGNOSIS:  Chronic cholecystitis with previously placed  cholecystostomy tube.   PROCEDURE:  Laparoscopic cholecystectomy with intraoperative  cholangiogram.   SURGEON:  Thomas A. Cornett, M.D.   ASSISTANT:  Sammuel Hines. Daiva Nakayama, M.D.   ANESTHESIA:  General endotracheal esthesia with 0.25% Sensorcaine.   DRAINS:  None.   INDICATIONS FOR PROCEDURE:  The patient is a 77 year old male seen back  in August for chronic cholecystitis.  Due to his medical comorbidities,  a percutaneous cholecystostomy tube was placed.  Medical issues had  resolved and he presents today for elective surgery to remove his  gallbladder for chronic cholecystitis.   DESCRIPTION OF PROCEDURE:  The patient was brought to the operating  room, placed supine.  After induction of general anesthesia the abdomen  is prepped and draped in sterile fashion.  A 1 cm infraumbilical  incision was made after injection of local anesthesia.  Dissection was  carried down to the fascia.  The fascia was opened with the scalpel  blade and a pursestring suture was placed in the fascia.  The abdominal  cavity was entered with a hemostat bluntly under direct vision.  Once  the pursestring suture was in place, the 12 mm Hassan cannula was placed  under direct vision.  Pneumoperitoneum was created to 15 mmHg of CO2 and  a laparoscope was placed.  CO2 was then placed to a pressure of 15 mmHg.  Laparoscopy was performed.  There were some adhesions of the right upper  quadrant.  Of  note, the cholecystostomy tube was removed after induction  of general anesthesia.  The gallbladder was adherent to the anterior  abdominal wall and showed some chronic signs but no acute inflammatory  changes.  A 11 mm subxiphoid port was placed under direct vision.  Two 5  mm ports were placed in the right mid abdomen both under direct vision.  We took down the adhesions from the gallbladder to the anterior  abdominal wall with cautery.  The dome was then grasped, pushed toward  the patient's right shoulder.  Second grasper was used to grab the  infundibulum and pull it to the patient's right lower quadrant.  Dissection then was carried out at the junction of the infundibulum and  cystic duct circumferentially.  Once this was dissected out  circumferentially, this was the only tubular structure entering the  gallbladder.  There was a small branch of the cystic artery that lay  anterior to this and I double clipped this and divided it.  I then  placed a clip on the gallbladder side of the cystic duct.  Small hole  was accidentally made in the infundibulum during dissection.  I placed a  clip across this to control any leakage of bile.  No stones were  spilled.  Incision was made in the cystic duct.  Through a separate stab  wound, a Cook cholangiogram catheter was introduced, placed in the  cystic duct and held in place by a clip.  Intraoperative cholangiogram  was performed using one half-strength Hypaque dye and fluoroscopy.  There was a very long tortuous nonobstructive cystic duct into a dilated  common duct.  There was an air bubble at the ampulla of Vater that  flushed through easily.  Contrast went up and dilated tortuous common  hepatic duct into right and left hepatic ducts.  I did not see any  evidence of any other obstruction, stone or stricture.  At this point in  time, I removed the cholangiogram catheter.  I triple clipped the cystic  duct and divided it.  Cystic arteries  identified, double clipped and  divided.  There was a posterior branch of the cystic artery that I  controlled with clips as well.  We then used the cautery to dissect the  gallbladder from the gallbladder fossa.  It was placed in an EndoCatch  bag.  Irrigation was used to irrigate out the gallbladder bed until  clear.  Surgicel was placed in the gallbladder bed with excellent  hemostasis and no leakage of bile.  Gallbladder was then placed in the  extraction bag and extracted out the umbilicus using the camera and the  subxiphoid port.  Hemostasis was excellent.  No evidence of any intra-  abdominal injury at this point.  The ports were removed.  CO2 was  allowed to escape, umbilical port was closed with pursestring suture of  0 Vicryl under direct vision prior to this.  The instruments were passed  from the field after the CO2 was allowed to escape, 4-0 Monocryl was  used to close all skin incisions.  The umbilical fascia was closed with  the previously placed 0 Vicryl pursestring suture.  Dermabond was  applied.  Dressing was placed over the previous percutaneous  cholecystostomy tube site.  The patient was then extubated and taken to  recovery in satisfactory condition.  All final counts of sponge, needle  and instruments were found to be correct at this portion of the case.      Thomas A. Cornett, M.D.  Electronically Signed     TAC/MEDQ  D:  04/04/2007  T:  04/04/2007  Job:  [id number]   cc:   [name] Body, MD

## 2010-07-20 NOTE — Consult Note (Signed)
[name]          ACCOUNT NO.:  [id number]   MEDICAL RECORD NO.:  [id number]          PATIENT TYPE:  INP   LOCATION:  5503                         FACILITY:  [facility]   PHYSICIAN:  Darrold Span. Florene Glen, M.D.  DATE OF BIRTH:  1933-05-22   DATE OF CONSULTATION:  DATE OF DISCHARGE:                                 CONSULTATION   REASON FOR CONSULTATION:  I was asked by the [facility] team to see this  77 year old male with history of recent acute renal failure, who was  seen on November 20, 2006 by Dr. Justin Mend after the patient had a bout of  cholecystitis requiring cholecystotomy placement.  On November 16, 2006, serum creatinine was 1.5 mg/dl, peaking to around 3.9 when seen in  September.  Around the time of discharge on September 20, serum  creatinine was 2.74 mg/dl.  Serum protein electrophoresis returned after  the patient was discharged revealing evidence of a monoclonal IgG lambda  and a IgM lambda protein.  Urine protein electrophoresis revealed a  monoclonal spica free kappa light chains.  ANCA, ANA, CH50 were all  negative.   The patient presented to the emergency room today after seeing Dr. Justin Mend  at [facility] complaining of left knee pain and low  blood pressure.  The knee pain was secondary to gout and was treated  with intraarticular steroids.  Blood pressure was as low as 81/59,  treated with intravenous fluids.  BUN returned at 101 and creatinine  returned at 4.52 mg/dl.  The patient is admitted for treatment.   PAST MEDICAL HISTORY:  Cholecystitis, status post cholecystotomy, gout,  anemia, hypertension, and COPD.   CURRENT MEDICATIONS:  1. Metoprolol.  2. Asmanex.  3. Colchicine.  4. Oxycodone.  5. Albuterol.  6. Atrovent.   FAMILY HISTORY:  Patient's son died due to end stage renal disease  secondary to diabetes mellitus.   PHYSICAL EXAMINATION:  VITAL SIGNS:  Blood pressure 100/65, heart rate  105.  GENERAL:  The patient is feeling  better, a thin African-American male.  HEENT:  Flattened neck veins.  LUNGS:  Clear, but distant sounds.  HEART:  Regular rhythm and rate.  ABDOMEN:  Soft, reveals a cholecystotomy tube is present.  EXTREMITIES:  No edema.  Skin turgor is markedly diminished.   LABORATORY STUDIES:  Sodium 135, potassium 5.2, chloride 101, CO2 21,  BUN 101, creatinine 3.42.  Hemoglobin 9.3 grams, white blood count  88,000, platelets 235,000.   ASSESSMENT:  1. Acute renal failure secondary to hypokalemia.  2. Monoclonal gammopathy, rule out multiple myeloma.  3. Anemia.   RECOMMENDATIONS:  1. Continue intravenous fluids.  2. Hematology consultation.  3. Lateral skull film.      Darrold Span. Florene Glen, M.D.  Electronically Signed     ACP/MEDQ  D:  12/12/2006  T:  12/13/2006  Job:  [id number]

## 2010-07-20 NOTE — H&P (Signed)
NAMEJONCARLO, TUCCIARONE          ACCOUNT NO.:  [id number]   MEDICAL RECORD NO.:  [id number]          PATIENT TYPE:  INP   LOCATION:  4736                         FACILITY:  [facility]   PHYSICIAN:  Domingo Cocking. Jimmye Norman, M.D.DATE OF BIRTH:  11/14/1933   DATE OF ADMISSION:  01/08/2007  DATE OF DISCHARGE:                              HISTORY & PHYSICAL   CHIEF COMPLAINT:  Hypotension and tachycardia.   PRIMARY CARE PHYSICIAN:  [facility].   HISTORY OF PRESENT ILLNESS:  This is a 77 year old male sent to the  emergency department from urgent care with a blood pressure in the  70s/50s and a heart rate in the 130s.  The patient had originally  presented to urgent care for pain medication for his gout and the  hypotension and tachycardia were found incidentally.  He was sent to the  emergency department with concern for hypovolemia.  The patient reports  decreased oral intake of food and water with no thirst. He feels full,  and has a bad taste in his mouth.  He has chronic cholecystitis with a  cholecystostomy tube draining to gravity - the cholecystectomy is  scheduled for January 15, 2007.  He was hospitalized at [facility] for  a similar incident of dehydration and volume depletion from October 6  through December 15, 2006.  At that time, he was treated also for  Klebsiella infection of the gallbladder with a 19-day course of Cipro.  The patient complains of weakness, constipation, and dyspnea on  exertion, but denies abdominal pain, dizziness, shortness of breath,  chest pains, palpitations, dysuria.  Following a 1 liter bolus in the  emergency department, his blood pressure was approximately 100/60.   PAST MEDICAL HISTORY:  1. Chronic cholecystitis with surgery scheduled for January 15, 2007,      percutaneous cholecystostomy tube was placed in September 2008.  2. Gout.  3. Asthma.  4. Hypertension.  5. Acute renal failure on chronic renal  insufficiency.  6. Anemia.  7. Malnutrition.  8. Chronic obstructive pulmonary disease.   ALLERGIES:  No known drug allergies.   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   FAMILY HISTORY:  Noncontributory.   REVIEW OF SYSTEMS:  Per history of present illness.   MEDICATIONS:  1. Colchicine 0.6 mg p.o. b.i.d.  2. Asmanex 220 mcg 1 puff b.i.d.  3. Albuterol 90 mcg 2 puffs q.i.d. p.r.n.  4. Atrovent 17 mcg 2 puffs b.i.d.  5. Percocet p.r.n. pain.   PHYSICAL EXAMINATION:  VITAL SIGNS:  Temperature 97.4, heart rate 108-  130, respirations 16-24, blood pressure 78-104/49-63.  Saturation 94-99%  on room air.  GENERAL:  Alert and oriented.  In no acute distress.  CARDIO:  Distant heart sounds.  PULMONARY:  Decreased breath sounds bilaterally.  ABDOMINAL:  Positive bowel sounds, nontender, nondistended.  Cholecystostomy tube draining bilious fluid.  HEENT:  Pupils are equally round and reactive to light.  Cataracts noted  bilateral.  Tachy mucous membranes.  RECTAL:  Enlarged prostate gland with stool in the rectum.  Hemoccult  negative.   LABORATORY DATA:  Sodium 136, potassium 4.6, chloride 109, bicarb 17,  BUN 42, creatinine 2.86, glucose 126, calcium 7.8, total protein 6.5,  albumin 2.7, AST 12, uric acid 11.3.  CBC - white blood count 15.3 with  a hemoglobin of 8.6, a hematocrit of 25.5 and platelets of 233.  Neutrophils 10.5, lymphs 2.8, monos 2.0, MCV 76 and RDW of 15.9.   ASSESSMENT/PLAN:  This is a 77 year old male with hypotension and  tachycardia.  1. Hypotension and tachycardia.  This is likely secondary to      hypovolemia.  The patient has poor oral intake and is responding to      IV fluids in the emergency department.  The patient has no fevers,      but with an increased white blood count will monitor closely for      sepsis; however, there are no signs at this time.  We will       rehydrate with normal saline at 150 mL/hour and hold his      metoprolol.  2. Chronic renal insufficiency.  Unsure of the patient's baseline      creatinine.  We will continue to rehydrate and monitor his      creatinine.  The patient's creatinine was 4.5 on admission at last      time.  Would avoid NSAIDs in this patient.  3. Gastrointestinal.  Cholecystectomy scheduled for November 10.  No      right upper quadrant pain at this point.  Will contact surgeon, Dr.      Brantley Stage, to left him know the patient is in the hospital.  4. Anemia.  Hemoglobin is 8.6, and was 9.3 at last admission.  The      patient is hemoccult negative and this may require an outpatient      workup.  Will transfuse for a hemoglobin less than 8.  5. Gout.  Continue the patient's home colchicine.  Provide Percocet      p.r.n. for pain.  Avoid NSAIDs due to chronic renal insufficiency.  6. Malnutrition.  This is apparently a longstanding problem, as it was      also noted on his last admission.  Will have a nutrition consult      this admission.  7. Chronic obstructive pulmonary disease.  Continue the patient's      Albuterol and Atrovent.  The patient is      asymptomatic at this time.  8. FEN.  Regular diet with maintenance IV fluids.   DISPOSITION:  Pending issue #1.      Graciella Belton, MD  Electronically Signed      Domingo Cocking. Jimmye Norman, M.D.  Electronically Signed    MO/MEDQ  D:  01/10/2007  T:  01/10/2007  Job:  [id number]

## 2010-07-20 NOTE — Consult Note (Signed)
NAMECLANCY, LORTZ          ACCOUNT NO.:  [id number]   MEDICAL RECORD NO.:  [id number]          PATIENT TYPE:  INP   LOCATION:  [id number]                         FACILITY:  [facility]   PHYSICIAN:  Cynda Familia, M.D.DATE OF BIRTH:  [birth year]   DATE OF CONSULTATION:  DATE OF DISCHARGE:                                 CONSULTATION   DATE OF CONSULTATION:  November 22, 2006   HISTORY OF PRESENT ILLNESS:  [name] is a 77 year old male who  was admitted to the hospital on November 13, 2006 for abdominal pain.  He has had a diagnosis of acute on chronic cholecystitis, hypertension,  mild renal insufficiency, mild transaminitis, history of asthma,  decreased exercise tolerance and tachycardia.   While in the hospital he has undergone treatment for cholecystitis.  As  part of his treatment his Allopurinol and colchicine have been stopped.  He has a history of gout for 20-30 years.  He has taken those two  medications on a chronic basis.  At this time he pain in his left knee  of acute onset.  He only complains of left knee pain at this point in  time; denies other joint pains.   ALLERGIES:  No known drug allergies.   PAST MEDICAL HISTORY:  1. Asthma.  2. Hypertension.  3. Gout.  4. Arthritis.   CURRENT MEDICATIONS:  Please see chart.   FAMILY HISTORY:  Noncontributory.   SOCIAL HISTORY:  Noncontributory.   PHYSICAL EXAMINATION:  GENERAL:  A very pleasant gentleman in no acute  distress.  He is lying in bed, answers questions appropriately.  EXTREMITIES:  The left knee is examined; mildly warm to touch, not  erythematous.  No evidence of cellulitis, no evidence of lymphangitis.  There is 1+ effusion.  Diffusely tender.  Range of motion is about 40  degrees.   Plain x-rays show mild medial compartment osteoarthritis; otherwise  negative.   PROCEDURE:  Per Judith Part. Chabon, P.A.C. the left knee was aspirated  under sterile technique.  Fifteen cc of yellow cloudy  fluid was removed.  This was then sent for cell count.   At this point in time the fluid has returned, indicating acute gouty  arthritis of the left knee.  He has both intracellular and extracellular  monosodium urate crystals.  White count is 25,000.  Gram stain is no  organisms seen.   IMPRESSION:  Left knee acute gouty arthritis.   RECOMMENDATIONS:  At this point in time no surgery is necessary.  We  recommend medical treatment of the gout.  Would recommended restarting  his colchicine.  When this episode is over, then resume his Allopurinol.  Otherwise analgesics of choice.  I will follow the culture; if positive  will re-evaluate as necessary.  For now, will sign off.  All questions  were encouraged and answered for the patient in detail.           ______________________________  Cynda Familia, M.D.     RAC/MEDQ  D:  11/22/2006  T:  11/23/2006  Job:  [id number]

## 2010-07-20 NOTE — Discharge Summary (Signed)
NAMEOCTAVIUS, PRAK          ACCOUNT NO.:  [id number]   MEDICAL RECORD NO.:  [id number]          PATIENT TYPE:  INP   LOCATION:  5501                         FACILITY:  [facility]   PHYSICIAN:  Cyril Mourning, D.O.    DATE OF BIRTH:  Aug 10, 1933   DATE OF ADMISSION:  12/11/2006  DATE OF DISCHARGE:  12/15/2006                               DISCHARGE SUMMARY   PRIMARY CARE PHYSICIAN:  [employer] [facility]   GENERAL SURGEON:  Marcello Moores A. Cornett, M.D.   FINAL DIAGNOSES:  1. Dehydration and volume depletion.  2. Acute gouty flair with left knee pain, status post aspiration of      his left knee revealing 58,500 WBC and positive urate crystals,      intra- and extracellular.  3. Acute renal failure, felt to be related to hypovolemia.  Volume      depletion.  4. Monoclonal gammopathy of undetermined significance with inpatient      evaluation revealing no evidence of suggestive findings on his bone      survey.  5. Chronic cholecystitis with cholecystostomy tube in anticipated      cholecystectomy to take place on November 10.  He does have culture      positive for Klebsiella, sensitive to Cipro, at the site for which      we will continue Cipro for at least 2 more weeks.  I do encourage      that he follow up with general surgery within time period and time      frame.  6. Essential hypertension.  7. History of chronic obstructive pulmonary disease.  8. Anemia of iron deficiency.   CONSULTATIONS:  1. Hematology/Oncology - Dr. Mathis Dad. Shadad.  2. Nephrology - Dr. Ollen Gross C. Powell.  3. Orthopedic Surgery - Dr. Cynda Familia.   DISCHARGE MEDICATIONS:  1. Recommend continuing colchicine at 0.6 mg twice daily.  2. Percocet 5/325, 1 or 2 tablets every 4 hours as needed.  3. Atrovent aerosol 2 puffs b.i.d.  4. As before Albuterol 2 puffs inhaled as needed q.i.d.  5. Metoprolol will be decreased for the next few days to 25 mg twice      daily.  6. Asmanex 220 mcg 1  puff twice daily.  7. Cipro 250 mg twice daily for 2 weeks course and continue to have      home health care follow and reassess his cholecystostomy tube and      be also followed by interventional radiology as needed.   STUDIES PERFORMED THIS ADMISSION:  His knee aspirate revealed positive  urate crystals, 58,000 WBCs.  His blood cultures were negative to date.  Final BUN and creatine were 41/1.37.  Sodium was 142, potassium 4.6,  glucose 110.  A local wound culture at the cholecystostomy site revealed  Klebsiella pneumoniae, which is being treated with oral antibiotics and  he is recommended to undergo followup with interventional radiology.  I  will contact interventional radiology prior to discharge to determine  whether or not they will need to address this prior to discharge.   HISTORY OF PRESENT ILLNESS:  Mr. Strathman is a  very pleasant 77-year-  old male with known history of acute cholecystitis, who had undergone a  percutaneous cholecystostomy tube in September 2008 under the direction  of Dr. Brantley Stage.  In any event, at that time he was also seen in  consultation by nephrology who was felt to have acute renal failure due  to prerenal etiology.  Ultimately, he was discharged home to have  followup in the outpatient setting and ultimate cholecystectomy in  November.  He presented on December 11, 2006 with left knee pain, some  generalized weakness and poor appetite.  He was seen in the emergency  department and noted to have an elevated serum uric acid of 15.4 and a  sed rate of 97.  His potassium was elevated at 5.9 and bicarb was 16.  His creatinine was elevated at 4.52.  He underwent further fluid  resuscitation and further potassium, including serum protein  electrophoresis with some concerning finding.  Nephrology followed and  consulted, as well as Dr. Alen Blew of hematology/oncology.  It was felt  that this was a monoclonal gammopathy of undetermined significance  without  supportive objective findings for myeloma.  His renal function  resolved, it improved.  He had some culture of his cholecystostomy tube  drain that revealed Klebsiella and was treated with Cipro with good  response.  His creatinine and renal function, appetite, and clinical  pictures returned to his baseline and he was felt to be suitable for  discharge home.  I will notify general surgery, as well as  interventional radiology prior to his discharge home.      Cyril Mourning, D.O.  Electronically Signed     ESS/MEDQ  D:  12/15/2006  T:  12/15/2006  Job:  [id number]   cc:   Thomas A. Cornett, M.D.  Sherril Croon, M.D.  Burman Riis, MD

## 2010-07-20 NOTE — H&P (Signed)
[name], [name]          ACCOUNT NO.:  [id number]   MEDICAL RECORD NO.:  [id number]          PATIENT TYPE:  INP   LOCATION:  [id number]                         FACILITY:  [facility]   PHYSICIAN:  Emma Leep, MD     DATE OF BIRTH:  1933/10/30   DATE OF ADMISSION:  12/11/2006  DATE OF DISCHARGE:                              HISTORY & PHYSICAL   PRIMARY MEDICAL DOCTOR:  Unassigned.  The patient seen at [facility] in [facility].   RENAL DOCTOR:  Dr. Edrick Oh.   CHIEF COMPLAINT:  Poor appetite, poor taste, early satiety, generalized  weakness, left knee pain.   HISTORY OF PRESENTING ILLNESS:  [name] is a 77 year old African-  American male patient with extensive past medical history as indicated  below.  He was admitted to [facility] in the middle of  September with right upper quadrant pain.  He was assessed to have acute  and chronic cholecystitis.  Cholecystotomy tube was placed.  He was also  noted to be in renal failure.  He had gouty flare of his left knee which  was aspirated and treated. In any case the patient was discharged from  the hospital about 2 weeks ago.   Since returning home the patient has continued to have poor taste,  extremely poor appetite, early satiety with generalized weakness,  significant amount of undetermined weight loss.  The patient has been  compliant with medications.  There is no history of nausea or vomiting.  The patient does complain of intermittent lower abdominal pain.  The  patient is said to be cold at all times.  There is no history of fevers  or chills, however.  The patient is constipated at times but there is no  diarrhea.  The draining out of the cholecystotomy bag seems to have  cleared up.  The patient was seen by the surgeons last week and  scheduled for a cholecystectomy in the first week of November.   For his routine appointment to see the nephrologist this morning the  patient had been to see Dr. Justin Mend where he was  noted to be hypotensive,  tachycardiac with painful swelling of the left knee.  The patient claims  that since the last discharge, his left knee had settled down only to  start flaring up again 2 days back with pain, swelling and warmth.  There is no redness.  In any case, Dr. Justin Mend sent the patient to the  emergency room for further evaluation and management.   PAST MEDICAL HISTORY:  1. Acute on chronic cholecystitis.  2. Hypertension.  3. Asthma.  4. Tachycardia.  5. Gout with recent acute left knee gouty arthritis.  6. Acute on chronic renal failure.  7. Arthritis.  8. Anemia of iron deficiency.  9. COPD.  10.Protein-calorie malnutrition.   PAST SURGICAL HISTORY:  Status post cholecystostomy.   ALLERGIES:  NO KNOWN DRUG ALLERGIES.   MEDICATIONS:  1. Metoprolol 25 mg p.o. b.i.d.  2. Asmanex Twisthaler 220 mcg 2 puffs inhaled b.i.d.  3. Colchicine 0.6 mg p.o. b.i.d.  4. Oxycodone/acetaminophen 5/325 mg 1 to 2 tablets p.o. every 4  hours      p.r.n.  5. Albuterol 2 puffs inhaled p.r.n.  6. Atrovent 2 puffs inhaled b.i.d.   FAMILY HISTORY:  1. Two sisters with history of gout and asthma.  2. The patient's son age 6 years, died recently with diabetic and      renal failure complications.   SOCIAL HISTORY:  The patient is married.  The patient's spouse is at the  bedside.  The patient is an Civil engineer, contracting.  He ambulates a  little bit with help of walker at home.  The patient quit smoking a few  years ago.  He used to use alcohol.  He also quit a couple of years ago.   REVIEW OF SYSTEMS:  14 systems reviewed and apart from the history of  presenting illness, is pertinent for:  1. Low tone voice but no hoarseness or dysphagia.  2. No dysuria, urgency or frequency.   PHYSICAL EXAMINATION:  GENERAL:  [name] is a small built and  thinly nourished male patient in no obvious distress.  VITAL SIGNS:  Temperature 98.1 degrees Fahrenheit, blood pressure is   109/70, which at one point was 81/59.  Pulse of 104 per minute.  Regular  respiratory rate of 18 per minute.  Saturating at 97% on room air.  HEENT:  Atraumatic, normocephalic.  Pupils equal, round, reactive to  light and accommodation.  Bilateral cataracts.  Oral cavity with no  oropharyngeal erythema or thrush. Dry mucosa.  NECK:  Without JVD, carotid bruit, lymphadenopathy or goiter.  Supple.  LYMPHATICS:  No lymphadenopathy.  RESPIRATORY SYSTEM:  Clear to auscultation.  CARDIOVASCULAR SYSTEM:  First and second heart sounds are heard,  regular, tachycardiac.  No third or fourth heart sounds, no murmurs,  rubs, gallops or clicks.  ABDOMEN:  Nondistended.  Right-sided cholecystostomy tube and dressings  are clean and dry with bilious, dark liquid draining into the bag.  Tenderness in the lower quadrants mild with no rigidity, rebound or  guarding.  No organomegaly or masses appreciated.  Bowel sounds are  normally heard.  CENTRAL NERVOUS SYSTEM:  The patient is awake, alert, oriented with no  focal neurological deficits.  EXTREMITIES:  Left knee swollen, warm and tender with painful range of  movement.  There is no redness.  Right knee is minimally tender and warm  but no swelling or redness.  Peripheral pulses are symmetrically well  felt and no pedal edema.  No cyanosis or clubbing.  SKIN: Without any rashes.   LABORATORY DATA:  ESR of 97, serum uric acid of 15.4, comprehensive  metabolic panel with sodium 131, potassium 5.9, chloride 102, bicarb 16,  glucose 111, BUN 101, creatinine 4.52, which was 3.9 on recent discharge  in mid September, bilirubin 1.5, alkaline phosphatase 205, AST 57, ALT  122, total protein 8.4, albumin 3.1, calcium 9.3, INR of 1.3, CBC with  hemoglobin 11.3, hematocrit 33.5, white blood cell 14.8, platelets of  237.   EKG with sinus tachycardia at 119 beats per minute.  Q waves in anterior  leads but otherwise no acute changes.   ASSESSMENT/PLAN:  1.  SIRS with hypotension and tachycardia, clinically improving with      hydration.  Most likely secondary to dehydration and acute gout.      Will check urinalysis, blood cultures and will aggressively treat      with IV fluids and admit to step down.  2. Failure to thrive, multifactorial.  3. Acute on chronic kidney disease secondary to  dehydration. Will      admit patient to step down unit.  Will aggressively hydrate with IV      fluids and monitor strict input/output and BMET. Will rule out      obstructive nephropathy by ultrasound.  4. Acute arthritis left knee greater than right.  Most likely acute      gouty arthritis.  However, to still rule out septic arthritis.  We      will place patient on pain medications.  Have consulted orthopedics      for arthrocentesis.  Will hold antibiotics and steroids until seen      by orthopedics.  Will continue Colchicine.  5. Hyperkalemia with no EKG changes.  Secondary to problem #3.  We      will provide Kayexalate and follow basic metabolic panel and also      hydrate patient.  6. Non-anion gap metabolic acidosis.  Questionable secondary to renal      failure. For IV fluids with bicarbonate drip.  7. Transaminitis, unclear etiology.  Will get a right upper quadrant      ultrasound.  8. Microcytic/iron deficiency anemia.  To follow CBC.  9. Leukocytosis secondary to gouty arthritis.  Follow CBCs.  I will      also get blood cultures.  10.Malnutrition: for nutrition consult.  11.Hypertension.  To continue metoprolol as blood pressure tolerates.  12.Chronic obstructive pulmonary disease which is stable.  Continue      home inhalers.  13.Hyperuricemia.  Unable to start uric acid lowering drugs at this      time secondary to patient's acute on chronic renal failure and      acute flare of gout.  Questionable candidate for dialysis.  Will      discuss with renal team in the a.m.      [name] Leep, MD  Electronically Signed     AH/MEDQ   D:  12/11/2006  T:  12/11/2006  Job:  [id number]   cc:   Doran Heater. Veverly Fells, M.D.  Sherril Croon, M.D.

## 2010-07-20 NOTE — Discharge Summary (Signed)
[name], [name]          ACCOUNT NO.:  [id number]   MEDICAL RECORD NO.:  [id number]          PATIENT TYPE:  INP   LOCATION:  6703                         FACILITY:  [facility]   PHYSICIAN:  Talbert Cage, M.D.DATE OF BIRTH:  September 04, 1933   DATE OF ADMISSION:  02/20/2007  DATE OF DISCHARGE:  02/22/2007                               DISCHARGE SUMMARY   DIAGNOSES:  1. Partial cholecystostomy blockage versus acute cholecystitis versus      early bacteremia.  2. Dehydration.  3. Acute renal failure.  4. History of gout.  5. History of chronic obstructive pulmonary disease.  6. Anemia.   DISCHARGE MEDICATIONS:  1. Ciprofloxacin 500 mg p.o. twice daily x7 days.  2. Colchicine 0.6 mg p.o. daily.  3. Iron sulfate 324 mg p.o. daily. Will hold this medication for 7      days while on antibiotic.  4. Flovent 220 mcg inhaler 1 puff twice daily.   CONSULTATIONS:  None.   PROCEDURE:  The patient underwent cholecystostomy exchange via IVR on  December 17, [id number] with no complications.   LABORATORY DATA:  On the day of admission, the patient had right upper  quadrant ultrasound that showed in the right upper quadrant a pigtail  catheter was in place, normal bowel gas pattern, no free air beneath the  diaphragms, no abnormal calcific opacities, no acute bony findings. The  patient also had a chest x-ray done which showed COPD but no acute  cardiopulmonary process. Also the day of admission, the patient had a  urine culture that showed insignificant growth after 3 days. Sodium  level of 139, potassium 4.4, chloride of 108, bicarb 22, glucose 110,  BUN of 14 and creatinine of 1.44. AST of 72, ALT of 166. Tbili 5.6 and  alk phos of 176. Total protein 7.6 and albumin of 3.9. White blood cell  count on the day of admission was 23.3, hemoglobin of 12.4. The patient  had a neutrophil percentage of 81%. Urinalysis was negative. Did have 30  of protein. Negative nitrites, negative leukocytes.  The patient's lipase  was 15. On the day of discharge, the patient's white blood cell count  was 7.8, hemoglobin of 9 and platelets of 159.   HOSPITAL COURSE:  This is a 77 year old, African-American male with a  history of acute cholecystitis status post percutaneous cholecystostomy  drain that was admitted for worrisome blockage of his cholecystostomy  with associated abdominal pain.  1. Abdominal pain. Upon admission, the patient complained of abdominal      pain in the right upper quadrant at the site of the drain. The      patient noted to have abnormal quantities and abnormal particulate      drain in his cholecystostomy. He underwent a right upper quadrant      ultrasound which was negative for stones. The patient was seen by      IVR who did an exchange of the cholecystostomy drain. Upon      admission, the patient did have an elevated white blood cell count      of 23,000 and was afebrile however. The patient was thought  to      possibly have acute cholecystitis or possibly early signs of      bacteremia. The patient was initially started on IV Cipro and IV      Flagyl and I also obtained a urine culture to rule out possible      sources of infection. During his hospitalization, he was afebrile,      his pain improved, he was able to tolerate p.o. He was aggressively      hydrated with normal saline. His white blood cell count trended      down to 7.8 on the day of discharge. He was discharged on oral      doses of Cipro and discontinued on the Flagyl. During his      hospitalization, Dr. Brantley Stage, his general surgeon, was contacted      and informed of his current situation and has agreed to see him in      his office on December 31 to evaluate his condition and also to set      up a followup appointment for a cholecystectomy.  2. Anemia. The patient has a history of anemia requiring transfusions      at his last hospitalization when he was discharged in November. He      has  been seen by his primary care physician and was noted to have a      stable hemoglobin in the 11s. During his admission, his initial      hemoglobin was 12.4, on the day of discharge it was 9. This is      thought to be partially due to delusional through aggressive IV      fluid resuscitation. This can be followed up as an outpatient. We      will hold his iron sulfate at this time while he is on his Cipro      for this could interact with the absorption of his Cipro. Will      recontinue the iron sulfate after 7 days and followup his      hemoglobin and anemia as an outpatient.  3. Dehydration. The patient initially came in with an elevated      creatinine of 1.44 and appeared dry on exam. The patient was given      a bolus and also placed on maintenance fluids. His creatinine on      the day of discharge was 1.12.   DISCHARGE INSTRUCTIONS:  The patient is to be on a low sodium heart  healthy diet. He needs to flush his drain daily. This is performed by  his wife. He has no restrictions on his activity. He needs to call his  primary doctor and return to the ER if his abdominal pain worsens, fever  greater than 101, not improved with Tylenol or cholecystostomy drain not  draining.   FOLLOWUP:  He is to see Dr. Brantley Stage, phone number [phone] on December  31 at 11 a.m. He is also going to followup with a scheduled appointment  with Dr. Netty Starring. He is to keep that appointment and it is in  January. The patient is aware of this appointment as it was scheduled  prior to admission.   CONDITION ON DISCHARGE:  The patient is stable. ..      [name], MD  Electronically Signed      Talbert Cage, M.D.  Electronically Signed    KL/MEDQ  D:  02/22/2007  T:  02/22/2007  Job:  [id number]  cc:   Marcello Moores A. Cornett, M.D.

## 2010-09-13 ENCOUNTER — Encounter: Payer: Self-pay | Admitting: Home Health Services

## 2010-09-13 ENCOUNTER — Ambulatory Visit (INDEPENDENT_AMBULATORY_CARE_PROVIDER_SITE_OTHER): Payer: Medicare Other | Admitting: Home Health Services

## 2010-09-13 ENCOUNTER — Ambulatory Visit: Payer: Medicare Other | Admitting: Home Health Services

## 2010-09-13 VITALS — BP 140/86 | HR 92 | Temp 98.4°F | Ht 72.0 in | Wt 187.0 lb

## 2010-09-13 DIAGNOSIS — Z0000 Encounter for general adult medical examination without abnormal findings: Secondary | ICD-10-CM

## 2010-09-13 NOTE — Progress Notes (Signed)
Patient here for annual wellness visit, patient reports: Risk Factors/Conditions needing evaluation or treatment: Pt scored a 9 on PHQ-9 which indicated mild depression.  Pt recognizes he is down but does not feel he is depressed. Home Safety: Pt lives with wife, step daughter, and grand daughter in 2 story home.  Pt reports having smoke detectors but does not have adaptive equipment in the bathroom. Other Information: Corrective lens: Pt wears daily corrective lens and is need of purchasing new glasses with new prescription but can not afford them. Dentures: Pt has full dentures.  Memory: Pt reports some memory loss. Patient's Mini Mental Score (recorded in doc. flowsheet): 30 Pt reported that he has a lot of hearing loss but can not afford hearing aids.  Gave pt information on [facility]'s free hearing aid program and the [facility]'s speech and language clinic for evaluation.   Balance/Gait: Pt get's winded easily due to COPD. Balance Abnormal Patient value  Sitting balance    Sit to stand    Attempts to arise    Immediate standing balance    Standing balance    Nudge    Eyes closed- Romberg    Tandem stance x Can not do  Back lean    Neck Rotation    360 degree turn    Sitting down     Gait Abnormal Patient value  Initiation of gait    Step length-left    Step length-right    Step height-left    Step height-right    Step symmetry    Step continuity    Path deviation    Trunk movement    Walking stance        Annual Wellness Visit Requirements Recorded Today In  Medical, family, social history Past Medical, Family, Social History Section  Current providers Care team  Current medications Medications  Wt, BP, Ht, BMI Vital signs  Hearing assessment (welcome visit) Declined- pt knows he has hearing problems.  Tobacco, alcohol, illicit drug use History  ADL Nurse Assessment  Depression Screening Nurse Assessment  Cognitive impairment Nurse Assessment  Mini Mental Status  Document Flowsheet  Fall Risk Nurse Assessment  Home Safety Progress Note  End of Life Planning (welcome visit) Social Documentation  Medicare preventative services Progress Note  Risk factors/conditions needing evaluation/treatment Progress Note  Personalized health advice Patient Instructions, goals, letter  Diet & Exercise Social Documentation  Emergency Contact Social Documentation  Seat Belts Social Documentation  Sun exposure/protection Social Documentation    Medicare Prevention Plan: Recommended pt contact pharmacy for shingles vaccine and consult with PCP on cholesterol and AAA ultrasound   Recommended Medicare Prevention Screenings Men over 65 Test For Frequency Date of Last- BOLD if needed  Colorectal Cancer 1-10 yrs 11/08  Prostate Cancer Never or yearly 2/10  Aortic Aneurysm Once if 65-75 with hx of smoking Consult with PCP  Cholesterol 5 yrs Consult with PCP  Diabetes yearly Non diabetic  HIV yearly declined  Influenza Shot yearly 2011  Pneumonia Shot once Pt reported done  Zostavax Shot once recommended

## 2010-09-13 NOTE — Patient Instructions (Signed)
1. Consider getting new glasses with new prescription. 2. Consider contacting speech and hearing clinic for hearing aid. 3. Focus on eating 3-4 vegetables a day. 4. Work on maintaining weight or loosing a little weight. 5. Set a quit smoking date. 6. Complete Living Will and bring [place] a copy when finished. 7. Keep taking all medicines and keep a record of blood pressure readings.

## 2010-09-14 ENCOUNTER — Encounter: Payer: Self-pay | Admitting: Home Health Services

## 2010-09-14 NOTE — Progress Notes (Signed)
I have reviewed this visit and discussed with Suzanne Lineberry and agree with her documentation  

## 2010-09-16 ENCOUNTER — Telehealth: Payer: Self-pay | Admitting: Family Medicine

## 2010-09-16 NOTE — Telephone Encounter (Signed)
Placed in MD box

## 2010-09-16 NOTE — Telephone Encounter (Signed)
Pt came by and drop off Hearing test form.

## 2010-09-17 ENCOUNTER — Encounter: Payer: Self-pay | Admitting: Family Medicine

## 2010-09-17 ENCOUNTER — Other Ambulatory Visit: Payer: Self-pay | Admitting: Family Medicine

## 2010-09-20 ENCOUNTER — Other Ambulatory Visit: Payer: Self-pay | Admitting: Family Medicine

## 2010-09-20 NOTE — Telephone Encounter (Signed)
Refill request

## 2010-09-23 NOTE — Telephone Encounter (Signed)
Filled out and placed in box to fax.

## 2010-10-03 ENCOUNTER — Other Ambulatory Visit: Payer: Self-pay | Admitting: Family Medicine

## 2010-10-03 NOTE — Telephone Encounter (Signed)
Refill request

## 2010-10-19 ENCOUNTER — Telehealth: Payer: Self-pay | Admitting: Family Medicine

## 2010-10-19 ENCOUNTER — Encounter: Payer: Self-pay | Admitting: Home Health Services

## 2010-10-19 NOTE — Telephone Encounter (Signed)
Vinnie Level can you please take care of this.  Thanks. Juliann Pulse

## 2010-10-19 NOTE — Telephone Encounter (Signed)
Pt came by the office with a referral form to [facility], says we have to do the referral for him to get a hearing aide. Pt also brought copy of his insurance card & the requirements for the referral. Placed in K Foster's office for any clinical completion.

## 2010-10-20 NOTE — Telephone Encounter (Signed)
Completed referral letter, Dr. Andria Frames signed, called pt, he will come to pick up papers.  Left at front desk.

## 2010-11-25 LAB — DIFFERENTIAL
Basophils Absolute: 0
Basophils Relative: 0
Eosinophils Absolute: 0.5
Eosinophils Relative: 6 — ABNORMAL HIGH
Lymphocytes Relative: 35
Lymphs Abs: 3.1
Monocytes Absolute: 0.8
Monocytes Relative: 9
Neutro Abs: 4.5
Neutrophils Relative %: 50

## 2010-11-25 LAB — CBC
HCT: 36.6 — ABNORMAL LOW
Hemoglobin: 12 — ABNORMAL LOW
MCHC: 32.8
MCV: 77.5 — ABNORMAL LOW
Platelets: 219
RBC: 4.72
RDW: 15.8 — ABNORMAL HIGH
WBC: 8.9

## 2010-11-25 LAB — COMPREHENSIVE METABOLIC PANEL
ALT: 214 — ABNORMAL HIGH
AST: 190 — ABNORMAL HIGH
Albumin: 3.8
Alkaline Phosphatase: 164 — ABNORMAL HIGH
BUN: 16
CO2: 23
Calcium: 9.1
Chloride: 112
Creatinine, Ser: 1
GFR calc Af Amer: >60
GFR calc non Af Amer: >60
Glucose, Bld: 82
Potassium: 4.1
Sodium: 141
Total Bilirubin: 0.6
Total Protein: 6.5

## 2010-11-29 ENCOUNTER — Other Ambulatory Visit: Payer: Self-pay | Admitting: Family Medicine

## 2010-11-29 NOTE — Telephone Encounter (Signed)
Refill request

## 2010-12-10 LAB — CBC
HCT: 26.4 — ABNORMAL LOW
HCT: 28.9 — ABNORMAL LOW
HCT: 37.6 — ABNORMAL LOW
Hemoglobin: 12.4 — ABNORMAL LOW
Hemoglobin: 9 — ABNORMAL LOW
Hemoglobin: 9.7 — ABNORMAL LOW
MCHC: 33
MCHC: 33.7
MCHC: 34.1
MCV: 77.8 — ABNORMAL LOW
MCV: 79.1
MCV: 79.1
Platelets: 156
Platelets: 159
Platelets: ADEQUATE
RBC: 3.39 — ABNORMAL LOW
RBC: 3.65 — ABNORMAL LOW
RBC: 4.75
RDW: 16.7 — ABNORMAL HIGH
RDW: 16.8 — ABNORMAL HIGH
RDW: 17.3 — ABNORMAL HIGH
WBC: 11.4 — ABNORMAL HIGH
WBC: 23.3 — ABNORMAL HIGH
WBC: 7.8

## 2010-12-10 LAB — BASIC METABOLIC PANEL
BUN: 11
BUN: 4 — ABNORMAL LOW
BUN: 9
CO2: 20
CO2: 20
CO2: 21
Calcium: 7.9 — ABNORMAL LOW
Calcium: 8.1 — ABNORMAL LOW
Calcium: 8.3 — ABNORMAL LOW
Chloride: 109
Chloride: 110
Chloride: 116 — ABNORMAL HIGH
Creatinine, Ser: 1.12
Creatinine, Ser: 1.24
Creatinine, Ser: 1.26
GFR calc Af Amer: >60
GFR calc Af Amer: >60
GFR calc Af Amer: >60
GFR calc non Af Amer: 56 — ABNORMAL LOW
GFR calc non Af Amer: 57 — ABNORMAL LOW
GFR calc non Af Amer: >60
Glucose, Bld: 106 — ABNORMAL HIGH
Glucose, Bld: 122 — ABNORMAL HIGH
Glucose, Bld: 90
Potassium: 2.8 — ABNORMAL LOW
Potassium: 3.6
Potassium: 3.6
Sodium: 137
Sodium: 138
Sodium: 143

## 2010-12-10 LAB — COMPREHENSIVE METABOLIC PANEL
ALT: 166 — ABNORMAL HIGH
AST: 72 — ABNORMAL HIGH
Albumin: 3.9
Alkaline Phosphatase: 176 — ABNORMAL HIGH
BUN: 14
CO2: 22
Calcium: 8.7
Chloride: 108
Creatinine, Ser: 1.44
GFR calc Af Amer: 59 — ABNORMAL LOW
GFR calc non Af Amer: 48 — ABNORMAL LOW
Glucose, Bld: 110 — ABNORMAL HIGH
Potassium: 4.4
Sodium: 139
Total Bilirubin: 5.6 — ABNORMAL HIGH
Total Protein: 7.6

## 2010-12-10 LAB — DIFFERENTIAL
Basophils Absolute: 0
Basophils Absolute: 0
Basophils Relative: 0
Basophils Relative: 0
Eosinophils Absolute: 0.4
Eosinophils Absolute: 0.5
Eosinophils Relative: 2
Eosinophils Relative: 6 — ABNORMAL HIGH
Lymphocytes Relative: 19
Lymphocytes Relative: 9 — ABNORMAL LOW
Lymphs Abs: 1.5
Lymphs Abs: 2.1
Monocytes Absolute: 0.9
Monocytes Absolute: 1.9 — ABNORMAL HIGH
Monocytes Relative: 11
Monocytes Relative: 8
Neutro Abs: 18.8 — ABNORMAL HIGH
Neutro Abs: 4.9
Neutrophils Relative %: 64
Neutrophils Relative %: 81 — ABNORMAL HIGH

## 2010-12-10 LAB — CULTURE, BLOOD (ROUTINE X 2): Culture: NO GROWTH

## 2010-12-10 LAB — URINE CULTURE: Colony Count: 9000

## 2010-12-10 LAB — URINALYSIS, ROUTINE W REFLEX MICROSCOPIC
Glucose, UA: NEGATIVE
Hgb urine dipstick: NEGATIVE
Ketones, ur: NEGATIVE
Leukocytes, UA: NEGATIVE
Nitrite: NEGATIVE
Protein, ur: 30 — AB
Specific Gravity, Urine: 1.014
Urobilinogen, UA: 1
pH: 6

## 2010-12-10 LAB — URINE MICROSCOPIC-ADD ON

## 2010-12-10 LAB — LIPASE, BLOOD: Lipase: 15

## 2010-12-14 LAB — COMPREHENSIVE METABOLIC PANEL
ALT: 18
AST: 12
Albumin: 2.7 — ABNORMAL LOW
Alkaline Phosphatase: 93
BUN: 42 — ABNORMAL HIGH
CO2: 17 — ABNORMAL LOW
Calcium: 7.8 — ABNORMAL LOW
Chloride: 109
Creatinine, Ser: 2.86 — ABNORMAL HIGH
GFR calc Af Amer: 27 — ABNORMAL LOW
GFR calc non Af Amer: 22 — ABNORMAL LOW
Glucose, Bld: 126 — ABNORMAL HIGH
Potassium: 4.6
Sodium: 136
Total Bilirubin: 1
Total Protein: 6.5

## 2010-12-14 LAB — IRON AND TIBC
Iron: 12 — ABNORMAL LOW
Saturation Ratios: 8 — ABNORMAL LOW
TIBC: 157 — ABNORMAL LOW
UIBC: 145

## 2010-12-14 LAB — BASIC METABOLIC PANEL
BUN: 12
BUN: 22
BUN: 39 — ABNORMAL HIGH
BUN: 9
CO2: 16 — ABNORMAL LOW
CO2: 16 — ABNORMAL LOW
CO2: 17 — ABNORMAL LOW
CO2: 18 — ABNORMAL LOW
Calcium: 7.1 — ABNORMAL LOW
Calcium: 7.4 — ABNORMAL LOW
Calcium: 7.5 — ABNORMAL LOW
Calcium: 7.8 — ABNORMAL LOW
Chloride: 110
Chloride: 116 — ABNORMAL HIGH
Chloride: 116 — ABNORMAL HIGH
Chloride: 118 — ABNORMAL HIGH
Creatinine, Ser: 1.11
Creatinine, Ser: 1.15
Creatinine, Ser: 1.56 — ABNORMAL HIGH
Creatinine, Ser: 2.27 — ABNORMAL HIGH
GFR calc Af Amer: 35 — ABNORMAL LOW
GFR calc Af Amer: 53 — ABNORMAL LOW
GFR calc Af Amer: >60
GFR calc Af Amer: >60
GFR calc non Af Amer: 29 — ABNORMAL LOW
GFR calc non Af Amer: 44 — ABNORMAL LOW
GFR calc non Af Amer: >60
GFR calc non Af Amer: >60
Glucose, Bld: 80
Glucose, Bld: 81
Glucose, Bld: 84
Glucose, Bld: 86
Potassium: 3 — ABNORMAL LOW
Potassium: 3.2 — ABNORMAL LOW
Potassium: 3.7
Potassium: 3.9
Sodium: 136
Sodium: 141
Sodium: 141
Sodium: 143

## 2010-12-14 LAB — CBC
HCT: 19.9 — ABNORMAL LOW
HCT: 25.1 — ABNORMAL LOW
HCT: 25.5 — ABNORMAL LOW
HCT: 32.2 — ABNORMAL LOW
Hemoglobin: 11 — ABNORMAL LOW
Hemoglobin: 6.7 — CL
Hemoglobin: 8.6 — ABNORMAL LOW
Hemoglobin: 8.6 — ABNORMAL LOW
MCHC: 33.3
MCHC: 33.8
MCHC: 34
MCHC: 34.1
MCV: 76.1 — ABNORMAL LOW
MCV: 76.5 — ABNORMAL LOW
MCV: 79.4
MCV: 80.1
Platelets: 230
Platelets: 233
Platelets: 265
Platelets: 326
RBC: 2.61 — ABNORMAL LOW
RBC: 3.16 — ABNORMAL LOW
RBC: 3.34 — ABNORMAL LOW
RBC: 4.03 — ABNORMAL LOW
RDW: 15.9 — ABNORMAL HIGH
RDW: 15.9 — ABNORMAL HIGH
RDW: 18.2 — ABNORMAL HIGH
RDW: 18.5 — ABNORMAL HIGH
WBC: 15.3 — ABNORMAL HIGH
WBC: 8.6
WBC: 9.5
WBC: 9.7

## 2010-12-14 LAB — CROSSMATCH
ABO/RH(D): O NEG
Antibody Screen: NEGATIVE
Weak D: POSITIVE

## 2010-12-14 LAB — DIFFERENTIAL
Basophils Absolute: 0
Basophils Relative: 0
Eosinophils Absolute: 0
Eosinophils Relative: 0
Lymphocytes Relative: 18
Lymphs Abs: 2.8
Monocytes Absolute: 2 — ABNORMAL HIGH
Monocytes Relative: 13 — ABNORMAL HIGH
Neutro Abs: 10.5 — ABNORMAL HIGH
Neutrophils Relative %: 69

## 2010-12-14 LAB — LACTATE DEHYDROGENASE: LDH: 124

## 2010-12-14 LAB — FERRITIN: Ferritin: 1520 — ABNORMAL HIGH (ref 22–322)

## 2010-12-14 LAB — URIC ACID: Uric Acid, Serum: 11.3 — ABNORMAL HIGH

## 2010-12-14 LAB — VITAMIN B12: Vitamin B-12: 253 (ref 211–911)

## 2010-12-14 LAB — PREPARE RBC (CROSSMATCH)

## 2010-12-14 LAB — ABO/RH: ABO/RH(D): O NEG

## 2010-12-14 LAB — FOLATE RBC: RBC Folate: 305

## 2010-12-14 LAB — RETICULOCYTES
RBC.: 3.28 — ABNORMAL LOW
Retic Count, Absolute: 32.8
Retic Ct Pct: 1

## 2010-12-14 LAB — HAPTOGLOBIN: Haptoglobin: 295 — ABNORMAL HIGH

## 2010-12-14 LAB — HEMOGLOBIN AND HEMATOCRIT, BLOOD
HCT: 30.9 — ABNORMAL LOW
Hemoglobin: 10.4 — ABNORMAL LOW

## 2010-12-16 LAB — RENAL FUNCTION PANEL
Albumin: 2.5 — ABNORMAL LOW
Albumin: 2 — ABNORMAL LOW
Albumin: 2.4 — ABNORMAL LOW
Albumin: 2.5 — ABNORMAL LOW
BUN: 41 — ABNORMAL HIGH
BUN: 43 — ABNORMAL HIGH
BUN: 48 — ABNORMAL HIGH
BUN: 61 — ABNORMAL HIGH
CO2: 27
CO2: 20
CO2: 22
CO2: 22
Calcium: 8.5
Calcium: 8.3 — ABNORMAL LOW
Calcium: 8.4
Calcium: 8.4
Chloride: 107
Chloride: 101
Chloride: 102
Chloride: 107
Creatinine, Ser: 1.37
Creatinine, Ser: 2.65 — ABNORMAL HIGH
Creatinine, Ser: 2.74 — ABNORMAL HIGH
Creatinine, Ser: 3.5 — ABNORMAL HIGH
GFR calc Af Amer: >60
GFR calc Af Amer: 21 — ABNORMAL LOW
GFR calc Af Amer: 28 — ABNORMAL LOW
GFR calc Af Amer: 29 — ABNORMAL LOW
GFR calc non Af Amer: 51 — ABNORMAL LOW
GFR calc non Af Amer: 17 — ABNORMAL LOW
GFR calc non Af Amer: 23 — ABNORMAL LOW
GFR calc non Af Amer: 24 — ABNORMAL LOW
Glucose, Bld: 110 — ABNORMAL HIGH
Glucose, Bld: 110 — ABNORMAL HIGH
Glucose, Bld: 116 — ABNORMAL HIGH
Glucose, Bld: 149 — ABNORMAL HIGH
Phosphorus: 3
Phosphorus: 3.8
Phosphorus: 4.3
Phosphorus: 4.3
Potassium: 4.6
Potassium: 3.4 — ABNORMAL LOW
Potassium: 3.6
Potassium: 3.6
Sodium: 142
Sodium: 133 — ABNORMAL LOW
Sodium: 134 — ABNORMAL LOW
Sodium: 139

## 2010-12-16 LAB — BASIC METABOLIC PANEL
BUN: 95 — ABNORMAL HIGH
BUN: 57 — ABNORMAL HIGH
CO2: 24
CO2: 25
Calcium: 8.9
Calcium: 8.2 — ABNORMAL LOW
Chloride: 99
Chloride: 100
Creatinine, Ser: 2.52 — ABNORMAL HIGH
Creatinine, Ser: 2.78 — ABNORMAL HIGH
GFR calc Af Amer: 31 — ABNORMAL LOW
GFR calc Af Amer: 27 — ABNORMAL LOW
GFR calc non Af Amer: 25 — ABNORMAL LOW
GFR calc non Af Amer: 23 — ABNORMAL LOW
Glucose, Bld: 132 — ABNORMAL HIGH
Glucose, Bld: 100 — ABNORMAL HIGH
Potassium: 4.7
Potassium: 3.1 — ABNORMAL LOW
Sodium: 135
Sodium: 136

## 2010-12-16 LAB — CBC
HCT: 26.3 — ABNORMAL LOW
HCT: 27.1 — ABNORMAL LOW
HCT: 27.6 — ABNORMAL LOW
HCT: 29.1 — ABNORMAL LOW
HCT: 33.5 — ABNORMAL LOW
HCT: 29.9 — ABNORMAL LOW
HCT: 30 — ABNORMAL LOW
HCT: 30.5 — ABNORMAL LOW
HCT: 30.5 — ABNORMAL LOW
HCT: 32.8 — ABNORMAL LOW
Hemoglobin: 11.3 — ABNORMAL LOW
Hemoglobin: 8.8 — ABNORMAL LOW
Hemoglobin: 9.3 — ABNORMAL LOW
Hemoglobin: 9.3 — ABNORMAL LOW
Hemoglobin: 9.7 — ABNORMAL LOW
Hemoglobin: 10.2 — ABNORMAL LOW
Hemoglobin: 10.3 — ABNORMAL LOW
Hemoglobin: 10.5 — ABNORMAL LOW
Hemoglobin: 10.6 — ABNORMAL LOW
Hemoglobin: 11.4 — ABNORMAL LOW
MCHC: 33.4
MCHC: 33.5
MCHC: 33.7
MCHC: 33.8
MCHC: 34.3
MCHC: 34.1
MCHC: 34.4
MCHC: 34.4
MCHC: 34.7
MCHC: 34.8
MCV: 76.8 — ABNORMAL LOW
MCV: 76.9 — ABNORMAL LOW
MCV: 77.7 — ABNORMAL LOW
MCV: 77.8 — ABNORMAL LOW
MCV: 78.3
MCV: 77.5 — ABNORMAL LOW
MCV: 78.2
MCV: 78.2
MCV: 78.3
MCV: 79
Platelets: 235
Platelets: 237
Platelets: 266
Platelets: 270
Platelets: 299
Platelets: 365
Platelets: 460 — ABNORMAL HIGH
Platelets: 464 — ABNORMAL HIGH
Platelets: 489 — ABNORMAL HIGH
Platelets: 504 — ABNORMAL HIGH
RBC: 3.35 — ABNORMAL LOW
RBC: 3.53 — ABNORMAL LOW
RBC: 3.59 — ABNORMAL LOW
RBC: 3.74 — ABNORMAL LOW
RBC: 4.31
RBC: 3.78 — ABNORMAL LOW
RBC: 3.87 — ABNORMAL LOW
RBC: 3.9 — ABNORMAL LOW
RBC: 3.91 — ABNORMAL LOW
RBC: 4.19 — ABNORMAL LOW
RDW: 15.2 — ABNORMAL HIGH
RDW: 15.3 — ABNORMAL HIGH
RDW: 15.5 — ABNORMAL HIGH
RDW: 15.7 — ABNORMAL HIGH
RDW: 15.9 — ABNORMAL HIGH
RDW: 16.2 — ABNORMAL HIGH
RDW: 16.3 — ABNORMAL HIGH
RDW: 16.3 — ABNORMAL HIGH
RDW: 16.5 — ABNORMAL HIGH
RDW: 16.7 — ABNORMAL HIGH
WBC: 11.3 — ABNORMAL HIGH
WBC: 11.7 — ABNORMAL HIGH
WBC: 12.1 — ABNORMAL HIGH
WBC: 14.8 — ABNORMAL HIGH
WBC: 8.8
WBC: 16.2 — ABNORMAL HIGH
WBC: 16.6 — ABNORMAL HIGH
WBC: 17.6 — ABNORMAL HIGH
WBC: 20.2 — ABNORMAL HIGH
WBC: 20.9 — ABNORMAL HIGH

## 2010-12-16 LAB — COMPREHENSIVE METABOLIC PANEL
ALT: 122 — ABNORMAL HIGH
ALT: 96 — ABNORMAL HIGH
ALT: 97 — ABNORMAL HIGH
ALT: 98 — ABNORMAL HIGH
ALT: 15
ALT: 20
ALT: 22
AST: 41 — ABNORMAL HIGH
AST: 50 — ABNORMAL HIGH
AST: 50 — ABNORMAL HIGH
AST: 57 — ABNORMAL HIGH
AST: 19
AST: 24
AST: 28
Albumin: 2.4 — ABNORMAL LOW
Albumin: 2.6 — ABNORMAL LOW
Albumin: 2.7 — ABNORMAL LOW
Albumin: 3.1 — ABNORMAL LOW
Albumin: 2 — ABNORMAL LOW
Albumin: 2.1 — ABNORMAL LOW
Albumin: 2.2 — ABNORMAL LOW
Alkaline Phosphatase: 158 — ABNORMAL HIGH
Alkaline Phosphatase: 166 — ABNORMAL HIGH
Alkaline Phosphatase: 184 — ABNORMAL HIGH
Alkaline Phosphatase: 205 — ABNORMAL HIGH
Alkaline Phosphatase: 57
Alkaline Phosphatase: 73
Alkaline Phosphatase: 75
BUN: 101 — ABNORMAL HIGH
BUN: 101 — ABNORMAL HIGH
BUN: 63 — ABNORMAL HIGH
BUN: 84 — ABNORMAL HIGH
BUN: 44 — ABNORMAL HIGH
BUN: 48 — ABNORMAL HIGH
BUN: 58 — ABNORMAL HIGH
CO2: 16 — ABNORMAL LOW
CO2: 21
CO2: 26
CO2: 31
CO2: 16 — ABNORMAL LOW
CO2: 17 — ABNORMAL LOW
CO2: 19
Calcium: 8.2 — ABNORMAL LOW
Calcium: 8.9
Calcium: 9.1
Calcium: 9.3
Calcium: 8.4
Calcium: 8.6
Calcium: 9.1
Chloride: 102
Chloride: 103
Chloride: 105
Chloride: 93 — ABNORMAL LOW
Chloride: 110
Chloride: 111
Chloride: 112
Creatinine, Ser: 1.54 — ABNORMAL HIGH
Creatinine, Ser: 2.07 — ABNORMAL HIGH
Creatinine, Ser: 3.42 — ABNORMAL HIGH
Creatinine, Ser: 4.52 — ABNORMAL HIGH
Creatinine, Ser: 3.68 — ABNORMAL HIGH
Creatinine, Ser: 3.92 — ABNORMAL HIGH
Creatinine, Ser: 4.13 — ABNORMAL HIGH
GFR calc Af Amer: 16 — ABNORMAL LOW
GFR calc Af Amer: 22 — ABNORMAL LOW
GFR calc Af Amer: 38 — ABNORMAL LOW
GFR calc Af Amer: 54 — ABNORMAL LOW
GFR calc Af Amer: 17 — ABNORMAL LOW
GFR calc Af Amer: 18 — ABNORMAL LOW
GFR calc Af Amer: 20 — ABNORMAL LOW
GFR calc non Af Amer: 13 — ABNORMAL LOW
GFR calc non Af Amer: 18 — ABNORMAL LOW
GFR calc non Af Amer: 32 — ABNORMAL LOW
GFR calc non Af Amer: 45 — ABNORMAL LOW
GFR calc non Af Amer: 14 — ABNORMAL LOW
GFR calc non Af Amer: 15 — ABNORMAL LOW
GFR calc non Af Amer: 16 — ABNORMAL LOW
Glucose, Bld: 111 — ABNORMAL HIGH
Glucose, Bld: 137 — ABNORMAL HIGH
Glucose, Bld: 142 — ABNORMAL HIGH
Glucose, Bld: 163 — ABNORMAL HIGH
Glucose, Bld: 107 — ABNORMAL HIGH
Glucose, Bld: 147 — ABNORMAL HIGH
Glucose, Bld: 152 — ABNORMAL HIGH
Potassium: 3.7
Potassium: 4.3
Potassium: 5.2 — ABNORMAL HIGH
Potassium: 5.9 — ABNORMAL HIGH
Potassium: 2.9 — ABNORMAL LOW
Potassium: 3.2 — ABNORMAL LOW
Potassium: 3.4 — ABNORMAL LOW
Sodium: 131 — ABNORMAL LOW
Sodium: 135
Sodium: 139
Sodium: 139
Sodium: 138
Sodium: 139
Sodium: 141
Total Bilirubin: 0.8
Total Bilirubin: 0.8
Total Bilirubin: 1.5 — ABNORMAL HIGH
Total Bilirubin: 1.7 — ABNORMAL HIGH
Total Bilirubin: 1.4 — ABNORMAL HIGH
Total Bilirubin: 1.9 — ABNORMAL HIGH
Total Bilirubin: 2.5 — ABNORMAL HIGH
Total Protein: 6.5
Total Protein: 7.3
Total Protein: 7.7
Total Protein: 8.4 — ABNORMAL HIGH
Total Protein: 6.6
Total Protein: 7
Total Protein: 7.5

## 2010-12-16 LAB — DIFFERENTIAL
Basophils Absolute: 0
Basophils Absolute: 0
Basophils Absolute: 0
Basophils Absolute: 0.1
Basophils Relative: 0
Basophils Relative: 0
Basophils Relative: 0
Basophils Relative: 0
Eosinophils Absolute: 0
Eosinophils Absolute: 0.1
Eosinophils Absolute: 0.5
Eosinophils Absolute: 1 — ABNORMAL HIGH
Eosinophils Relative: 0
Eosinophils Relative: 0
Eosinophils Relative: 3
Eosinophils Relative: 5
Lymphocytes Relative: 11 — ABNORMAL LOW
Lymphocytes Relative: 9 — ABNORMAL LOW
Lymphocytes Relative: 11 — ABNORMAL LOW
Lymphocytes Relative: 8 — ABNORMAL LOW
Lymphs Abs: 1
Lymphs Abs: 1.3
Lymphs Abs: 1.3
Lymphs Abs: 2.2
Monocytes Absolute: 0.1 — ABNORMAL LOW
Monocytes Absolute: 1.1 — ABNORMAL HIGH
Monocytes Absolute: 0.4
Monocytes Absolute: 1.5 — ABNORMAL HIGH
Monocytes Relative: 1 — ABNORMAL LOW
Monocytes Relative: 7
Monocytes Relative: 2 — ABNORMAL LOW
Monocytes Relative: 9
Neutro Abs: 12.3 — ABNORMAL HIGH
Neutro Abs: 7.7
Neutro Abs: 13.2 — ABNORMAL HIGH
Neutro Abs: 16.6 — ABNORMAL HIGH
Neutrophils Relative %: 83 — ABNORMAL HIGH
Neutrophils Relative %: 87 — ABNORMAL HIGH
Neutrophils Relative %: 80 — ABNORMAL HIGH
Neutrophils Relative %: 82 — ABNORMAL HIGH

## 2010-12-16 LAB — SYNOVIAL CELL COUNT + DIFF, W/ CRYSTALS
Eosinophils-Synovial: 0
Lymphocytes-Synovial Fld: 4
Monocyte-Macrophage-Synovial Fluid: 10 — ABNORMAL LOW
Neutrophil, Synovial: 86 — ABNORMAL HIGH
WBC, Synovial: 58500 — ABNORMAL HIGH

## 2010-12-16 LAB — URINE CULTURE: Colony Count: 15000

## 2010-12-16 LAB — CLOSTRIDIUM DIFFICILE EIA
C difficile Toxins A+B, EIA: NEGATIVE
C difficile Toxins A+B, EIA: NEGATIVE

## 2010-12-16 LAB — IGG, IGA, IGM
IgA: 362
IgG (Immunoglobin G), Serum: 1140
IgM, Serum: 134

## 2010-12-16 LAB — URINE MICROSCOPIC-ADD ON

## 2010-12-16 LAB — PROTIME-INR
INR: 1.3
INR: 1.4
Prothrombin Time: 16.5 — ABNORMAL HIGH
Prothrombin Time: 17.5 — ABNORMAL HIGH

## 2010-12-16 LAB — URINALYSIS, MICROSCOPIC ONLY
Glucose, UA: NEGATIVE
Hgb urine dipstick: NEGATIVE
Ketones, ur: 15 — AB
Nitrite: NEGATIVE
Protein, ur: 30 — AB
Specific Gravity, Urine: 1.024
Urobilinogen, UA: 0.2
pH: 5

## 2010-12-16 LAB — HEMOCHROMATOSIS DNA-PCR(C282Y,H63D)

## 2010-12-16 LAB — BODY FLUID CULTURE
Culture: NO GROWTH
Culture: NO GROWTH

## 2010-12-16 LAB — PROTEIN ELECTROPH W RFLX QUANT IMMUNOGLOBULINS
Albumin ELP: 39.4 — ABNORMAL LOW
Alpha-1-Globulin: 10.8 — ABNORMAL HIGH
Alpha-2-Globulin: 21.4 — ABNORMAL HIGH
Beta 2: 7 — ABNORMAL HIGH
Beta Globulin: 4.9
Gamma Globulin: 16.5
M-Spike, %: 0.15
Total Protein ELP: 6.4

## 2010-12-16 LAB — URINALYSIS, ROUTINE W REFLEX MICROSCOPIC
Bilirubin Urine: NEGATIVE
Glucose, UA: NEGATIVE
Nitrite: NEGATIVE
Protein, ur: 30 — AB
Specific Gravity, Urine: 1.018
Urobilinogen, UA: 0.2
pH: 5.5

## 2010-12-16 LAB — WOUND CULTURE: Gram Stain: NONE SEEN

## 2010-12-16 LAB — KAPPA/LAMBDA LIGHT CHAINS
Kappa free light chain: 1.5 (ref 0.33–1.94)
Kappa, lambda light chain ratio: 0.85 (ref 0.26–1.65)
Lambda free light chains: 1.77 (ref 0.57–2.63)

## 2010-12-16 LAB — HEMOGLOBIN A1C
Hgb A1c MFr Bld: 5.7
Hgb A1c MFr Bld: 5.9
Mean Plasma Glucose: 126
Mean Plasma Glucose: 133

## 2010-12-16 LAB — BODY FLUID CELL COUNT WITH DIFFERENTIAL
Lymphs, Fluid: 4
Monocyte-Macrophage-Serous Fluid: 3 — ABNORMAL LOW
Neutrophil Count, Fluid: 93 — ABNORMAL HIGH
Total Nucleated Cell Count, Fluid: 25310 — ABNORMAL HIGH

## 2010-12-16 LAB — CHOLESTEROL, TOTAL: Cholesterol: 75

## 2010-12-16 LAB — PTH, INTACT AND CALCIUM
Calcium, Total (PTH): 8.3 — ABNORMAL LOW
PTH: 191.1 — ABNORMAL HIGH

## 2010-12-16 LAB — MAGNESIUM
Magnesium: 1.3 — ABNORMAL LOW
Magnesium: 1.6
Magnesium: 1.7
Magnesium: 1.7

## 2010-12-16 LAB — URIC ACID: Uric Acid, Serum: 15.4 — ABNORMAL HIGH

## 2010-12-16 LAB — PHOSPHORUS
Phosphorus: 5 — ABNORMAL HIGH
Phosphorus: 3.8
Phosphorus: 4.6

## 2010-12-16 LAB — CULTURE, BLOOD (ROUTINE X 2)
Culture: NO GROWTH
Culture: NO GROWTH

## 2010-12-16 LAB — ANAEROBIC CULTURE

## 2010-12-16 LAB — UIFE/LIGHT CHAINS/TP QN, 24-HR UR
Albumin, U: DETECTED
Alpha 1, Urine: DETECTED — AB
Alpha 2, Urine: DETECTED — AB
Beta, Urine: DETECTED — AB
Free Kappa Lt Chains,Ur: 77.7 — ABNORMAL HIGH (ref 0.04–1.51)
Free Kappa/Lambda Ratio: 6.82 — ABNORMAL HIGH (ref 0.46–4.00)
Free Lambda Excretion/Day: 85.5
Free Lambda Lt Chains,Ur: 11.4 — ABNORMAL HIGH (ref 0.08–1.01)
Free Lt Chn Excr Rate: 582.75
Gamma Globulin, Urine: DETECTED — AB
Time: 24
Total Protein, Urine-Ur/day: 692 — ABNORMAL HIGH (ref 10–140)
Total Protein, Urine: 92.2
Volume, Urine: 750

## 2010-12-16 LAB — TRIGLYCERIDES: Triglycerides: 143

## 2010-12-16 LAB — ANTI-NEUTROPHIL ANTIBODY: Cytoplasmic Neutrophilic Ab: <1:20 {titer}

## 2010-12-16 LAB — COMPLEMENT, TOTAL: Compl, Total (CH50): 59 U/mL (ref 31–66)

## 2010-12-16 LAB — ANA: Anti Nuclear Antibody(ANA): NEGATIVE

## 2010-12-16 LAB — APTT: aPTT: 39 — ABNORMAL HIGH

## 2010-12-16 LAB — OCCULT BLOOD X 1 CARD TO LAB, STOOL: Fecal Occult Bld: POSITIVE

## 2010-12-16 LAB — C4 COMPLEMENT: Complement C4, Body Fluid: 27

## 2010-12-16 LAB — PTH-RELATED PEPTIDE: PTH-related peptide: <0.2 (ref <1.4)

## 2010-12-16 LAB — IMMUNOFIXATION ADD-ON

## 2010-12-16 LAB — C3 COMPLEMENT: C3 Complement: 177

## 2010-12-16 LAB — PREALBUMIN: Prealbumin: 9 — ABNORMAL LOW

## 2010-12-16 LAB — PSA: PSA: 1.31

## 2010-12-16 LAB — SEDIMENTATION RATE: Sed Rate: 97 — ABNORMAL HIGH

## 2010-12-16 LAB — SYNOVIAL FLUID, CRYSTAL

## 2010-12-17 LAB — COMPREHENSIVE METABOLIC PANEL
ALT: 11
ALT: 14
ALT: 15
ALT: 17
ALT: 20
ALT: 25
ALT: 27
AST: 12
AST: 13
AST: 14
AST: 16
AST: 20
AST: 20
AST: 31
Albumin: 2 — ABNORMAL LOW
Albumin: 2.3 — ABNORMAL LOW
Albumin: 2.6 — ABNORMAL LOW
Albumin: 2.7 — ABNORMAL LOW
Albumin: 2.7 — ABNORMAL LOW
Albumin: 2.9 — ABNORMAL LOW
Albumin: 3.6
Alkaline Phosphatase: 59
Alkaline Phosphatase: 69
Alkaline Phosphatase: 77
Alkaline Phosphatase: 82
Alkaline Phosphatase: 84
Alkaline Phosphatase: 86
Alkaline Phosphatase: 91
BUN: 17
BUN: 17
BUN: 23
BUN: 23
BUN: 32 — ABNORMAL HIGH
BUN: 33 — ABNORMAL HIGH
BUN: 44 — ABNORMAL HIGH
CO2: 21
CO2: 22
CO2: 24
CO2: 24
CO2: 26
CO2: 26
CO2: 27
Calcium: 8.1 — ABNORMAL LOW
Calcium: 8.3 — ABNORMAL LOW
Calcium: 8.5
Calcium: 8.6
Calcium: 8.8
Calcium: 8.9
Calcium: 9.2
Chloride: 101
Chloride: 101
Chloride: 103
Chloride: 104
Chloride: 104
Chloride: 110
Chloride: 99
Creatinine, Ser: 1.29
Creatinine, Ser: 1.46
Creatinine, Ser: 1.47
Creatinine, Ser: 1.59 — ABNORMAL HIGH
Creatinine, Ser: 2.02 — ABNORMAL HIGH
Creatinine, Ser: 2.2 — ABNORMAL HIGH
Creatinine, Ser: 3.19 — ABNORMAL HIGH
GFR calc Af Amer: 23 — ABNORMAL LOW
GFR calc Af Amer: 36 — ABNORMAL LOW
GFR calc Af Amer: 40 — ABNORMAL LOW
GFR calc Af Amer: 52 — ABNORMAL LOW
GFR calc Af Amer: 57 — ABNORMAL LOW
GFR calc Af Amer: 58 — ABNORMAL LOW
GFR calc Af Amer: >60
GFR calc non Af Amer: 19 — ABNORMAL LOW
GFR calc non Af Amer: 30 — ABNORMAL LOW
GFR calc non Af Amer: 33 — ABNORMAL LOW
GFR calc non Af Amer: 43 — ABNORMAL LOW
GFR calc non Af Amer: 47 — ABNORMAL LOW
GFR calc non Af Amer: 48 — ABNORMAL LOW
GFR calc non Af Amer: 55 — ABNORMAL LOW
Glucose, Bld: 104 — ABNORMAL HIGH
Glucose, Bld: 110 — ABNORMAL HIGH
Glucose, Bld: 118 — ABNORMAL HIGH
Glucose, Bld: 127 — ABNORMAL HIGH
Glucose, Bld: 132 — ABNORMAL HIGH
Glucose, Bld: 134 — ABNORMAL HIGH
Glucose, Bld: 169 — ABNORMAL HIGH
Potassium: 2.7 — CL
Potassium: 2.8 — ABNORMAL LOW
Potassium: 3.9
Potassium: 3.9
Potassium: 4
Potassium: 4.2
Potassium: 4.5
Sodium: 133 — ABNORMAL LOW
Sodium: 135
Sodium: 135
Sodium: 136
Sodium: 138
Sodium: 139
Sodium: 145
Total Bilirubin: 1.4 — ABNORMAL HIGH
Total Bilirubin: 1.5 — ABNORMAL HIGH
Total Bilirubin: 2.3 — ABNORMAL HIGH
Total Bilirubin: 2.5 — ABNORMAL HIGH
Total Bilirubin: 2.6 — ABNORMAL HIGH
Total Bilirubin: 2.6 — ABNORMAL HIGH
Total Bilirubin: 3.1 — ABNORMAL HIGH
Total Protein: 6.3
Total Protein: 6.4
Total Protein: 6.7
Total Protein: 7
Total Protein: 7
Total Protein: 7.2
Total Protein: 7.9

## 2010-12-17 LAB — CHOLESTEROL, TOTAL: Cholesterol: 113

## 2010-12-17 LAB — CBC
HCT: 26.1 — ABNORMAL LOW
HCT: 26.2 — ABNORMAL LOW
HCT: 26.6 — ABNORMAL LOW
HCT: 28 — ABNORMAL LOW
HCT: 28.1 — ABNORMAL LOW
HCT: 29.6 — ABNORMAL LOW
HCT: 31 — ABNORMAL LOW
HCT: 35.4 — ABNORMAL LOW
Hemoglobin: 10.1 — ABNORMAL LOW
Hemoglobin: 10.8 — ABNORMAL LOW
Hemoglobin: 12 — ABNORMAL LOW
Hemoglobin: 8.9 — ABNORMAL LOW
Hemoglobin: 9 — ABNORMAL LOW
Hemoglobin: 9.1 — ABNORMAL LOW
Hemoglobin: 9.5 — ABNORMAL LOW
Hemoglobin: 9.6 — ABNORMAL LOW
MCHC: 33.9
MCHC: 34
MCHC: 34
MCHC: 34.1
MCHC: 34.1
MCHC: 34.1
MCHC: 34.3
MCHC: 34.9
MCV: 75.8 — ABNORMAL LOW
MCV: 75.9 — ABNORMAL LOW
MCV: 76.3 — ABNORMAL LOW
MCV: 76.4 — ABNORMAL LOW
MCV: 76.4 — ABNORMAL LOW
MCV: 76.6 — ABNORMAL LOW
MCV: 76.7 — ABNORMAL LOW
MCV: 79.8
Platelets: 193
Platelets: 196
Platelets: 230
Platelets: 237
Platelets: 256
Platelets: 286
Platelets: 296
Platelets: 309
RBC: 3.41 — ABNORMAL LOW
RBC: 3.46 — ABNORMAL LOW
RBC: 3.5 — ABNORMAL LOW
RBC: 3.67 — ABNORMAL LOW
RBC: 3.68 — ABNORMAL LOW
RBC: 3.88 — ABNORMAL LOW
RBC: 3.89 — ABNORMAL LOW
RBC: 4.63
RDW: 14.6 — ABNORMAL HIGH
RDW: 14.7 — ABNORMAL HIGH
RDW: 14.7 — ABNORMAL HIGH
RDW: 14.8 — ABNORMAL HIGH
RDW: 14.9 — ABNORMAL HIGH
RDW: 14.9 — ABNORMAL HIGH
RDW: 14.9 — ABNORMAL HIGH
RDW: 16.1 — ABNORMAL HIGH
WBC: 13.6 — ABNORMAL HIGH
WBC: 13.7 — ABNORMAL HIGH
WBC: 14.5 — ABNORMAL HIGH
WBC: 15.1 — ABNORMAL HIGH
WBC: 15.4 — ABNORMAL HIGH
WBC: 16 — ABNORMAL HIGH
WBC: 19.1 — ABNORMAL HIGH
WBC: 26.6 — ABNORMAL HIGH

## 2010-12-17 LAB — URINALYSIS, ROUTINE W REFLEX MICROSCOPIC
Glucose, UA: NEGATIVE
Hgb urine dipstick: NEGATIVE
Nitrite: NEGATIVE
Protein, ur: NEGATIVE
Specific Gravity, Urine: 1.022
Urobilinogen, UA: 0.2
pH: 5.5

## 2010-12-17 LAB — DIFFERENTIAL
Basophils Absolute: 0
Basophils Absolute: 0
Basophils Absolute: 0
Basophils Relative: 0
Basophils Relative: 0
Basophils Relative: 0
Eosinophils Absolute: 0
Eosinophils Absolute: 0
Eosinophils Absolute: 0
Eosinophils Relative: 0
Eosinophils Relative: 0
Eosinophils Relative: 0
Lymphocytes Relative: 9 — ABNORMAL LOW
Lymphocytes Relative: 9 — ABNORMAL LOW
Lymphocytes Relative: 9 — ABNORMAL LOW
Lymphs Abs: 1.3
Lymphs Abs: 1.5
Lymphs Abs: 2.4
Monocytes Absolute: 1.6 — ABNORMAL HIGH
Monocytes Absolute: 1.9 — ABNORMAL HIGH
Monocytes Absolute: 3.2 — ABNORMAL HIGH
Monocytes Relative: 11
Monocytes Relative: 12 — ABNORMAL HIGH
Monocytes Relative: 12 — ABNORMAL HIGH
Neutro Abs: 10.8 — ABNORMAL HIGH
Neutro Abs: 12.6 — ABNORMAL HIGH
Neutro Abs: 21 — ABNORMAL HIGH
Neutrophils Relative %: 79 — ABNORMAL HIGH
Neutrophils Relative %: 79 — ABNORMAL HIGH
Neutrophils Relative %: 79 — ABNORMAL HIGH

## 2010-12-17 LAB — PREPARE FRESH FROZEN PLASMA

## 2010-12-17 LAB — POTASSIUM: Potassium: 2.9 — ABNORMAL LOW

## 2010-12-17 LAB — CLOSTRIDIUM DIFFICILE EIA
C difficile Toxins A+B, EIA: NEGATIVE
C difficile Toxins A+B, EIA: NEGATIVE

## 2010-12-17 LAB — VANCOMYCIN, RANDOM: Vancomycin Rm: 18.1

## 2010-12-17 LAB — CARDIAC PANEL(CRET KIN+CKTOT+MB+TROPI)
CK, MB: 1
CK, MB: 1.1
Relative Index: INVALID
Relative Index: INVALID
Total CK: 61
Total CK: 65
Troponin I: 0.01
Troponin I: 0.02

## 2010-12-17 LAB — TYPE AND SCREEN
ABO/RH(D): O NEG
Antibody Screen: NEGATIVE

## 2010-12-17 LAB — BASIC METABOLIC PANEL
BUN: 41 — ABNORMAL HIGH
CO2: 24
Calcium: 8.1 — ABNORMAL LOW
Chloride: 108
Creatinine, Ser: 2.62 — ABNORMAL HIGH
GFR calc Af Amer: 29 — ABNORMAL LOW
GFR calc non Af Amer: 24 — ABNORMAL LOW
Glucose, Bld: 133 — ABNORMAL HIGH
Potassium: 2.9 — ABNORMAL LOW
Sodium: 142

## 2010-12-17 LAB — CULTURE, BLOOD (ROUTINE X 2)
Culture: NO GROWTH
Culture: NO GROWTH

## 2010-12-17 LAB — PREALBUMIN: Prealbumin: 6.2 — ABNORMAL LOW

## 2010-12-17 LAB — CROSSMATCH
ABO/RH(D): O NEG
Antibody Screen: NEGATIVE

## 2010-12-17 LAB — IRON AND TIBC
Iron: 13 — ABNORMAL LOW
Saturation Ratios: 8 — ABNORMAL LOW
TIBC: 172 — ABNORMAL LOW
UIBC: 159

## 2010-12-17 LAB — FECAL LACTOFERRIN, QUANT: Fecal Lactoferrin: POSITIVE

## 2010-12-17 LAB — PROTIME-INR
INR: 1.3
INR: 1.3
Prothrombin Time: 16.2 — ABNORMAL HIGH
Prothrombin Time: 16.4 — ABNORMAL HIGH

## 2010-12-17 LAB — OCCULT BLOOD X 1 CARD TO LAB, STOOL
Fecal Occult Bld: NEGATIVE
Fecal Occult Bld: NEGATIVE

## 2010-12-17 LAB — TRIGLYCERIDES: Triglycerides: 151 — ABNORMAL HIGH

## 2010-12-17 LAB — BODY FLUID CULTURE

## 2010-12-17 LAB — MAGNESIUM
Magnesium: 1.8
Magnesium: 2

## 2010-12-17 LAB — TROPONIN I: Troponin I: 0.03

## 2010-12-17 LAB — POCT CARDIAC MARKERS
CKMB, poc: <1 — ABNORMAL LOW
Myoglobin, poc: 247
Operator id: 4074
Troponin i, poc: <0.05

## 2010-12-17 LAB — PTH, INTACT AND CALCIUM
Calcium, Total (PTH): 7.9 — ABNORMAL LOW
PTH: 136.9 — ABNORMAL HIGH

## 2010-12-17 LAB — PHOSPHORUS
Phosphorus: 2.7
Phosphorus: 4.8 — ABNORMAL HIGH

## 2010-12-17 LAB — CK TOTAL AND CKMB (NOT AT ARMC)
CK, MB: 1
Relative Index: INVALID
Total CK: 45

## 2010-12-17 LAB — TRANSFERRIN: Transferrin: 135 — ABNORMAL LOW

## 2010-12-17 LAB — LIPASE, BLOOD: Lipase: 16

## 2010-12-17 LAB — APTT: aPTT: 46 — ABNORMAL HIGH

## 2010-12-17 LAB — HEMOGLOBIN AND HEMATOCRIT, BLOOD
HCT: 24.9 — ABNORMAL LOW
Hemoglobin: 8.3 — ABNORMAL LOW

## 2010-12-17 LAB — VITAMIN D 1,25 DIHYDROXY: Vit D, 1,25-Dihydroxy: 35 pg/mL (ref 6–62)

## 2010-12-17 LAB — FERRITIN: Ferritin: 1876 — ABNORMAL HIGH (ref 22–322)

## 2010-12-17 LAB — ABO/RH: ABO/RH(D): O NEG

## 2010-12-17 LAB — PREPARE RBC (CROSSMATCH)

## 2010-12-17 LAB — VANCOMYCIN, TROUGH: Vancomycin Tr: 20.5 — ABNORMAL HIGH

## 2010-12-21 ENCOUNTER — Other Ambulatory Visit: Payer: Self-pay | Admitting: Family Medicine

## 2010-12-21 NOTE — Telephone Encounter (Signed)
Refill request

## 2010-12-24 ENCOUNTER — Encounter: Payer: Self-pay | Admitting: Family Medicine

## 2010-12-24 ENCOUNTER — Ambulatory Visit (INDEPENDENT_AMBULATORY_CARE_PROVIDER_SITE_OTHER): Payer: Medicare Other | Admitting: Family Medicine

## 2010-12-24 VITALS — BP 168/93 | HR 78 | Temp 97.9°F | Ht 69.0 in | Wt 185.0 lb

## 2010-12-24 DIAGNOSIS — M5126 Other intervertebral disc displacement, lumbar region: Secondary | ICD-10-CM | POA: Insufficient documentation

## 2010-12-24 DIAGNOSIS — M79669 Pain in unspecified lower leg: Secondary | ICD-10-CM

## 2010-12-24 DIAGNOSIS — M79609 Pain in unspecified limb: Secondary | ICD-10-CM

## 2010-12-24 LAB — CBC WITH DIFFERENTIAL/PLATELET
Basophils Absolute: 0 10*3/uL (ref 0.0–0.1)
Basophils Relative: 0 % (ref 0–1)
Eosinophils Absolute: 0.7 10*3/uL (ref 0.0–0.7)
Eosinophils Relative: 6 % — ABNORMAL HIGH (ref 0–5)
HCT: 36.7 % — ABNORMAL LOW (ref 39.0–52.0)
Hemoglobin: 13.1 g/dL (ref 13.0–17.0)
Lymphocytes Relative: 38 % (ref 12–46)
Lymphs Abs: 4.8 10*3/uL — ABNORMAL HIGH (ref 0.7–4.0)
MCH: 27.3 pg (ref 26.0–34.0)
MCHC: 35.7 g/dL (ref 30.0–36.0)
MCV: 76.6 fL — ABNORMAL LOW (ref 78.0–100.0)
Monocytes Absolute: 1 10*3/uL (ref 0.1–1.0)
Monocytes Relative: 8 % (ref 3–12)
Neutro Abs: 6.1 10*3/uL (ref 1.7–7.7)
Neutrophils Relative %: 48 % (ref 43–77)
Platelets: 198 10*3/uL (ref 150–400)
RBC: 4.79 MIL/uL (ref 4.22–5.81)
RDW: 15.8 % — ABNORMAL HIGH (ref 11.5–15.5)
WBC: 12.7 10*3/uL — ABNORMAL HIGH (ref 4.0–10.5)

## 2010-12-24 LAB — COMPREHENSIVE METABOLIC PANEL
ALT: 18 U/L (ref 0–53)
AST: 14 U/L (ref 0–37)
Albumin: 3.9 g/dL (ref 3.5–5.2)
Alkaline Phosphatase: 88 U/L (ref 39–117)
BUN: 15 mg/dL (ref 6–23)
CO2: 24 mEq/L (ref 19–32)
Calcium: 9.7 mg/dL (ref 8.4–10.5)
Chloride: 104 mEq/L (ref 96–112)
Creat: 0.95 mg/dL (ref 0.50–1.35)
Glucose, Bld: 76 mg/dL (ref 70–99)
Potassium: 4 mEq/L (ref 3.5–5.3)
Sodium: 140 mEq/L (ref 135–145)
Total Bilirubin: 0.3 mg/dL (ref 0.3–1.2)
Total Protein: 7 g/dL (ref 6.0–8.3)

## 2010-12-24 LAB — D-DIMER, QUANTITATIVE: D-Dimer, Quant: 0.42 ug/mL-FEU (ref 0.00–0.48)

## 2010-12-24 MED ORDER — TRAMADOL HCL 50 MG PO TABS: 50.0000 mg | ORAL_TABLET | Freq: Four times a day (QID) | ORAL | Status: DC | PRN

## 2010-12-24 NOTE — Patient Instructions (Addendum)
Will get bloodwork to rule out dvt- you may need to get  An ultrasound of your leg.  I suspect this may be coming form your hip  Tramadol for pain.  Use miralax and colace (stool softener) for constipation

## 2010-12-24 NOTE — Progress Notes (Signed)
  Subjective:    Patient ID: [name], male    DOB: 17-Jun-1933, 77 y.o.   MRN: [id number]  HPI patient is a 77 year old male who presents as a work in appointment for 3 weeks of left leg pain which he think may be his gout.  Patient reports left calf and back of his thigh pain for the past 3 weeks. This was not preceded by any injury or change in physical activity. He reports no recent immobility, car rides, surgeries. There has been no redness or swelling. He notes pain completely resolves when he is still but with any movement has 10 out of 10 pain primarily in his calf but also posterior thigh. He states the overall course of the pain is worsening. He has been taking Tylenol arthritis for the pain.  Patient initially reported that he had never had pain like this before but on further questioning stated that in the past he had pain like this which was worked up with what sounds to be angiography to detect any peripheral arterial disease (this was performed at the [place]). He states that no cause for his pain was ever found.    Review of SystemsNo fever, chills, dyspnea, abdominal pain, chest pain.     Objective:   Physical Exam GEN: Alert & Oriented, No acute distress CV:  Regular Rate & Rhythm, no murmur Respiratory:  Normal work of breathing, CTAB Abd:  + BS, soft, no tenderness to palpation MSK:  Left leg with no erythema, edema.  Both calves measure 35 cm in diameter. Calf and thigh  very tender to palpation.  No palpable cords, no pain over tibia.  He was a great deal of pain even when moving on the exam table.  Worse with full extension of his knee.  Unable to externally rotate hip due to pain.        Assessment & Plan:

## 2010-12-24 NOTE — Assessment & Plan Note (Addendum)
Patient most likely has referred pain from his hip although referred pain from his back is also possible as well. Also in the differential diagnosis is DVT, peripheral artery disease .  He has no evidence of gout in his knees, ankles or feet.  Per patient history he has had an evaluation and states he was found have good blood flow to his legs.  Patient has a low likelihood of DVT given symptoms and clinical findings. D-dimer drawn today has ruled this out. Patient has a mild elevation in his white blood count with a left shift. This does not correlate with his clinical exam.  I discussed with the patient ruling out serious causes of calf pain today, treating his pain with tramadol, and referral for further evaluation of his history of moderate to severe hip pain which I believe need be causing his leg symptoms.  He states he would prefer to followup with his PCP and will discuss referral at that time which i think is very reasonable.

## 2010-12-27 ENCOUNTER — Telehealth: Payer: Self-pay | Admitting: Family Medicine

## 2010-12-27 NOTE — Telephone Encounter (Signed)
Forwarded to pcp.[name]  

## 2010-12-27 NOTE — Telephone Encounter (Signed)
Pt is returning call from Friday - thinks it's about his lab work results Also, pain meds are not working.  Needs to know what else can be done.

## 2010-12-29 ENCOUNTER — Encounter: Payer: Self-pay | Admitting: Family Medicine

## 2010-12-29 ENCOUNTER — Ambulatory Visit (INDEPENDENT_AMBULATORY_CARE_PROVIDER_SITE_OTHER): Payer: Medicare Other | Admitting: Family Medicine

## 2010-12-29 VITALS — BP 171/88 | HR 88 | Temp 98.5°F | Ht 69.0 in | Wt 187.5 lb

## 2010-12-29 DIAGNOSIS — M79609 Pain in unspecified limb: Secondary | ICD-10-CM

## 2010-12-29 DIAGNOSIS — M79605 Pain in left leg: Secondary | ICD-10-CM

## 2010-12-29 DIAGNOSIS — M545 Low back pain, unspecified: Secondary | ICD-10-CM

## 2010-12-29 DIAGNOSIS — Z23 Encounter for immunization: Secondary | ICD-10-CM

## 2010-12-29 DIAGNOSIS — M79669 Pain in unspecified lower leg: Secondary | ICD-10-CM

## 2010-12-29 MED ORDER — GABAPENTIN 100 MG PO CAPS: ORAL_CAPSULE | ORAL | Status: DC

## 2010-12-29 MED ORDER — COLCHICINE 0.6 MG PO TABS: 0.6000 mg | ORAL_TABLET | Freq: Every day | ORAL | Status: DC

## 2010-12-29 MED ORDER — PREDNISONE 50 MG PO TABS: 50.0000 mg | ORAL_TABLET | Freq: Every day | ORAL | Status: DC

## 2010-12-31 ENCOUNTER — Ambulatory Visit
Admission: RE | Admit: 2010-12-31 | Discharge: 2010-12-31 | Disposition: A | Discharge status: Home / Self Care | Payer: Medicare Other | Source: Ambulatory Visit | Attending: Family Medicine | Admitting: Family Medicine

## 2010-12-31 ENCOUNTER — Telehealth: Payer: Self-pay | Admitting: *Deleted

## 2010-12-31 DIAGNOSIS — M545 Low back pain, unspecified: Secondary | ICD-10-CM

## 2010-12-31 DIAGNOSIS — M79605 Pain in left leg: Secondary | ICD-10-CM

## 2010-12-31 MED ORDER — GADOBENATE DIMEGLUMINE 529 MG/ML IV SOLN
17.0000 mL | Freq: Once | INTRAVENOUS | Status: AC | PRN
  Administered 2010-12-31: 17 mL via INTRAVENOUS

## 2010-12-31 NOTE — Telephone Encounter (Signed)
Paged Dr. Jess Barters with MRI report. He is post call and ask to have preceptor review.  Dr. Andria Frames reviewed. Dr. Jess Barters will notify patient .

## 2010-12-31 NOTE — Telephone Encounter (Signed)
Patient comes to office confused about directions for gabapentin.  ( See med list and directions entered  ) Consulted with Dr. Wendy Poet and he advises should be 1 capsule three times daily for a week then 2 capsules three times daily for a week  and then 3 cap three times daily. Patient reports much discomfort . Started meds 2 days ago . Had MRI today . Advised Dr. Jess Barters will contact him about results.

## 2011-01-03 ENCOUNTER — Telehealth: Payer: Self-pay | Admitting: Family Medicine

## 2011-01-03 MED ORDER — OXYCODONE-ACETAMINOPHEN 5-325 MG PO TABS: 1.0000 | ORAL_TABLET | Freq: Three times a day (TID) | ORAL | Status: DC

## 2011-01-03 NOTE — Patient Instructions (Signed)
It was good to see you today! We will try a gradual neurontin taper to see if we can help your pain.  I think it is nerve pain, although you are welcome to try your colcichine for it. We will get an MRI of your back and get you to see physical therapy.

## 2011-01-03 NOTE — Progress Notes (Signed)
Subjective: The patient presents today to report on the progress with his leg pain. He reports that the tramadol he was recently prescribed has not provided significant relief from his pain. He continues to have pain in his left leg, primarily in the calf, made worse by exertion, and only somewhat relieved by rest. He has had a previous workup for peripheral vascular disease that was unremarkable. He has not had any problems with several anesthesia, bowel or bladder incontinence.  Objective:  Filed Vitals:   12/29/10 0932  BP: 171/88  Pulse: 88  Temp: 98.5 F (36.9 C)   Gen: No acute distress, is now using a walker to walk which is uncharacteristic for him CV: Regular rate and rhythm Resp: Clear to auscultation bilaterally Extremities: 2+ reflexes at bilateral patellar, 1+ reflex on the right at ankle, minimal reflex on the left ankle. Preserved sensation and strength.  Assessment/Plan: Concern for her spinal stenosis versus other low back injury. This would be classified as acute. We will obtain an MRI of the lumbar spine, provider gradual titration of Neurontin, and provide a referral for physical therapy. The patient is convinced that his pain is gout. He is out of his colchicine. While I do not believe this has anything to do with a gout, I will provide a refill on this prescription as it will not hurt him to try this.  Please also see individual problems in problem list for problem-specific plans.

## 2011-01-03 NOTE — Telephone Encounter (Signed)
Message copied by [facility] on Mon Jan 03, 2011 11:20 AM ------      Message from: Domenic Schwab      Created: Fri Dec 31, 2010  4:48 PM                   ----- Message -----         From: Rad Results In Interface         Sent: 12/31/2010   4:21 PM           To: Dorothy Spark Hensel

## 2011-01-03 NOTE — Assessment & Plan Note (Signed)
Concern for her spinal stenosis versus other low back injury. This would be classified as acute. We will obtain an MRI of the lumbar spine, provider gradual titration of Neurontin, and provide a referral for physical therapy. The patient is convinced that his pain is gout. He is out of his colchicine. While I do not believe this has anything to do with a gout, I will provide a refill on this prescription as it will not hurt him to try this.

## 2011-01-03 NOTE — Telephone Encounter (Signed)
Spoke with the patient about the results of his MRI. Patient is still having significant pain. Gabapentin is not significantly helping. Discussed with the patient the idea of using a stronger pain medicine for a limited time basis. Patient expresses understanding of using it for a limited time only. Will make an exception to general removal of requiring an appointment for scheduled to as the patient was just recently seen. Will give her prescription for 63 Percocet to be taken 3 times daily scheduled and allow patient to pick this up at the front desk.

## 2011-01-12 ENCOUNTER — Ambulatory Visit: Payer: Medicare Other | Attending: Sports Medicine | Admitting: Rehabilitation

## 2011-01-12 DIAGNOSIS — M545 Low back pain, unspecified: Secondary | ICD-10-CM | POA: Insufficient documentation

## 2011-01-12 DIAGNOSIS — IMO0001 Reserved for inherently not codable concepts without codable children: Secondary | ICD-10-CM | POA: Insufficient documentation

## 2011-01-12 DIAGNOSIS — R269 Unspecified abnormalities of gait and mobility: Secondary | ICD-10-CM | POA: Insufficient documentation

## 2011-01-18 ENCOUNTER — Telehealth: Payer: Self-pay | Admitting: *Deleted

## 2011-01-18 NOTE — Telephone Encounter (Signed)
Sent pt's information to [place] pain management for evaluation of epidural block. Pt could not afford to continue tx with rehab due to his insurance co pay and Remo Lipps S.(PT) Suggested that he may be able to benefit from going to have this done. Informed Dr. Jess Barters and he is willing to send pt to be evaluated and treated.[name]

## 2011-01-20 ENCOUNTER — Telehealth: Payer: Self-pay | Admitting: *Deleted

## 2011-01-20 NOTE — Telephone Encounter (Signed)
Called and informed pt of appt with [facility]  On 01.10.2013 @ 115 pm with Dr. Brien Few. Their address is [address]. Phone is [phone]. Informed pt that should he not be able to keep this appt he will need to call their office 24-48 hours in advance to cancel/reschedule. Pt understood and agreed.[name] [place]

## 2011-01-24 ENCOUNTER — Other Ambulatory Visit: Payer: Self-pay | Admitting: Family Medicine

## 2011-01-24 NOTE — Telephone Encounter (Signed)
Pt has an appt in January for pain clinic and will be out of his pain meds this Friday.  Needs enough to last until appt with pain clinic. pls call when ready

## 2011-01-24 NOTE — Telephone Encounter (Signed)
Appointment set up for 11/26 @ 2:30pm w/ Dr. Jess Barters

## 2011-01-31 ENCOUNTER — Ambulatory Visit (INDEPENDENT_AMBULATORY_CARE_PROVIDER_SITE_OTHER): Payer: Medicare Other | Admitting: Family Medicine

## 2011-01-31 ENCOUNTER — Encounter: Payer: Self-pay | Admitting: Family Medicine

## 2011-01-31 DIAGNOSIS — M79669 Pain in unspecified lower leg: Secondary | ICD-10-CM

## 2011-01-31 DIAGNOSIS — M79609 Pain in unspecified limb: Secondary | ICD-10-CM

## 2011-01-31 MED ORDER — OXYCODONE-ACETAMINOPHEN 5-325 MG PO TABS: 1.0000 | ORAL_TABLET | Freq: Three times a day (TID) | ORAL | Status: DC

## 2011-01-31 MED ORDER — GABAPENTIN 300 MG PO CAPS: ORAL_CAPSULE | ORAL | Status: DC

## 2011-01-31 MED ORDER — OXYCODONE HCL 10 MG PO TB12: 10.0000 mg | ORAL_TABLET | ORAL | Status: DC

## 2011-01-31 NOTE — Patient Instructions (Signed)
I'm sorry you aren't feeling better yet. Get in touch with your insurance company to see if there are any other physical therapists who would be better covered by your insurance.

## 2011-02-17 NOTE — Progress Notes (Signed)
Subjective: The patient presents today for medication refills. He continues to have problems with back pain radiating into his left leg. The pain is somewhat better than at his last visit. Unfortunately, he was not able to afford to go to a physical therapist. The therapist did suggest seeing someone for consideration for an epidural steroid injection. This referral is in progress, however the patient will not be seen until the end of January 2013.  The patient is using a walker significantly less than before, and reports that with his pain medication, he is better able to get up and move around. He reports, however, that the end of 4 to 5 hours he will begin to have significant amounts of pain again. He has only been taking the pain medicine every 6 to 7 hours. The patient does not report any significant weakness in his leg, but rather reports that his activity is limited mainly by pain. The character of the pain remains unchanged. The patient is not having any problems with bowel or bladder incontinence. He is not having significant problems with numbness.  Objective:  Filed Vitals:   01/31/11 1508  BP: 122/70  Pulse: 88   Gen: NAD CV: RRR Resp: CTABL Ext: 2+ reflexes, symmetric.  No significant numbness.  Full strength, although he is slightly limited by pain.  Assessment/Plan:  Please also see individual problems in problem list for problem-specific plans.

## 2011-02-17 NOTE — Assessment & Plan Note (Signed)
The patient's pain is caused by a herniated disc. I explained to the patient that physical therapy is the absolute best treatment for this, and encouraged him to contact his insurance company to determine if there were any physical therapists he might be able to see at a more reasonable rate. Between now and when he is seen by a pain management physician, I will continue to prescribe narcotics. As I explained to the patient, this is not a long-term solution, and I would not plan to continue these indefinitely. In the short term, however, I will add a long acting narcotic in hopes of reducing on-demand narcotic usage. At the moment, the patient is not interested in neurosurgical intervention

## 2011-02-22 ENCOUNTER — Ambulatory Visit: Payer: Medicare Other | Admitting: Family Medicine

## 2011-03-09 ENCOUNTER — Ambulatory Visit (INDEPENDENT_AMBULATORY_CARE_PROVIDER_SITE_OTHER): Payer: Medicare Other | Admitting: Family Medicine

## 2011-03-09 ENCOUNTER — Encounter: Payer: Self-pay | Admitting: Family Medicine

## 2011-03-09 VITALS — BP 134/84 | HR 101 | Temp 98.1°F | Wt 181.0 lb

## 2011-03-09 DIAGNOSIS — J45909 Unspecified asthma, uncomplicated: Secondary | ICD-10-CM

## 2011-03-09 MED ORDER — BUDESONIDE-FORMOTEROL FUMARATE 160-4.5 MCG/ACT IN AERO: 2.0000 | INHALATION_SPRAY | Freq: Two times a day (BID) | RESPIRATORY_TRACT | Status: DC

## 2011-03-09 MED ORDER — AZITHROMYCIN 500 MG PO TABS: 500.0000 mg | ORAL_TABLET | Freq: Every day | ORAL | Status: AC

## 2011-03-09 MED ORDER — GUAIFENESIN-CODEINE 100-10 MG/5ML PO SYRP: 10.0000 mL | ORAL_SOLUTION | Freq: Three times a day (TID) | ORAL | Status: AC | PRN

## 2011-03-09 MED ORDER — PREDNISONE 5 MG PO TABS: 10.0000 mg | ORAL_TABLET | Freq: Every day | ORAL | Status: AC

## 2011-03-09 NOTE — Assessment & Plan Note (Signed)
Antibiotics per medication orders. Antitussives per medication orders. Avoid exposure to tobacco smoke and fumes. B-agonist inhaler. Call if shortness of breath worsens, blood in sputum, change in character of cough, development of fever or chills, inability to maintain nutrition and hydration. Avoid exposure to tobacco smoke and fumes. Oral steroids for three days.

## 2011-03-09 NOTE — Progress Notes (Signed)
  Subjective:     [name] is a 78 y.o. male here for evaluation of a cough. Patient was sick with viral illness around christmas and this resolved. Onset of cough symptom was 3 days ago. Symptoms have been unchanged since that time. The cough is dry and harsh and is aggravated by cold air and fumes. Associated symptoms include: none. Patient does have a history of asthma. Patient does not have a history of environmental allergens. Patient has not traveled recently. Patient does have a history of smoking. Patient has not had a previous chest x-ray. Patient has not had a PPD done.  The following portions of the patient's history were reviewed and updated as appropriate: allergies, current medications, past family history, past medical history, past social history, past surgical history and problem list.  Review of Systems Pertinent items are noted in HPI.  No fever, chills, night sweats, weight loss, hemoptysis, voice change. No sob, no chest pain.  Objective:   Filed Vitals:   03/09/11 1428  BP: 134/84  Pulse: 101  Temp: 98.1 F (36.7 C)  TempSrc: Oral  Weight: 181 lb (82.101 kg)  Lungs:  Normal respiratory effort, chest expands symmetrically. Lungs are clear to auscultation, no crackles. Scattered wheezes present. Heart - Regular rate and rhythm.  No murmurs, gallops or rubs.    Throat: normal mucosa, no exudate, uvula midline, no redness Neck:  No deformities, thyromegaly, masses, or tenderness noted.   Supple with full range of motion without pain.   Assessment:    Asthma and Cough    Plan:

## 2011-03-09 NOTE — Patient Instructions (Signed)
It was great to see you today!  Schedule an appointment to see me as needed.  

## 2011-03-18 ENCOUNTER — Other Ambulatory Visit: Payer: Self-pay | Admitting: Family Medicine

## 2011-03-18 MED ORDER — GABAPENTIN 300 MG PO CAPS: ORAL_CAPSULE | ORAL | Status: DC

## 2011-03-18 MED ORDER — ALLOPURINOL 300 MG PO TABS: 300.0000 mg | ORAL_TABLET | Freq: Every day | ORAL | Status: DC

## 2011-03-18 MED ORDER — FLUTICASONE-SALMETEROL 250-50 MCG/DOSE IN AEPB: 1.0000 | INHALATION_SPRAY | Freq: Two times a day (BID) | RESPIRATORY_TRACT | Status: DC

## 2011-03-18 NOTE — Telephone Encounter (Signed)
Refill request

## 2011-03-28 ENCOUNTER — Other Ambulatory Visit: Payer: Self-pay | Admitting: Family Medicine

## 2011-03-28 NOTE — Telephone Encounter (Signed)
Refill request

## 2011-05-30 ENCOUNTER — Other Ambulatory Visit: Payer: Self-pay | Admitting: Family Medicine

## 2011-06-07 ENCOUNTER — Encounter: Payer: Self-pay | Admitting: Family Medicine

## 2011-06-07 ENCOUNTER — Ambulatory Visit (INDEPENDENT_AMBULATORY_CARE_PROVIDER_SITE_OTHER): Payer: Medicare Other | Admitting: Family Medicine

## 2011-06-07 VITALS — BP 139/79 | HR 84 | Ht 69.0 in | Wt 187.0 lb

## 2011-06-07 DIAGNOSIS — M79609 Pain in unspecified limb: Secondary | ICD-10-CM

## 2011-06-07 DIAGNOSIS — M79669 Pain in unspecified lower leg: Secondary | ICD-10-CM

## 2011-06-07 DIAGNOSIS — G548 Other nerve root and plexus disorders: Secondary | ICD-10-CM | POA: Insufficient documentation

## 2011-06-07 MED ORDER — ALBUTEROL SULFATE HFA 108 (90 BASE) MCG/ACT IN AERS: 2.0000 | INHALATION_SPRAY | Freq: Four times a day (QID) | RESPIRATORY_TRACT | Status: DC | PRN

## 2011-06-07 MED ORDER — OXYCODONE-ACETAMINOPHEN 5-325 MG PO TABS: 1.0000 | ORAL_TABLET | Freq: Three times a day (TID) | ORAL | Status: DC

## 2011-06-07 NOTE — Progress Notes (Signed)
Subjective: The patient is a 78 y.o. year old male who presents today for back/leg pain. He continues to have this pain without significant improvement. He continues to take the Neurontin and Roxicet. He was unable to afford physical therapy. Other pains are getting worse, he is wondering if there are any additional options for his management. He denies any bowel or bladder symptoms. He denies any numbness or weakness although he has reported several close to falls recently. He has not hurt himself.  Objective:  Filed Vitals:   06/07/11 1344  BP: 139/79  Pulse: 84   Gen: NAD, mildly overweight CV: RRR Resp: CTABL Back: Some discomfort with palpation of paraspinous muscles, no point tenderness or deformity Ext: Pt limps with walking, favoring right leg.  5+ strength in bilateral legs, pain with weight bearing on the left.  2+ reflexes bilaterally, good pulses.  Assessment/Plan:  Please also see individual problems in problem list for problem-specific plans.

## 2011-06-07 NOTE — Patient Instructions (Signed)
I want you to increase the amount of the gabapentin you are taking.  Beginning this week I want you to do the following:  Week 1: 1 pill  1 pill  2 pills Week 2: 2 pills  1 pill  2 pills Week 3: 2 pills  2 pills  2 pills Week 4: 2 pills  2 pills  3 pills  Come back to see me in about 4 weeks so we can see how you are doing. Think some more about seeing a neuro-surgeon to talk about if they can do anything to correct your problem. I have sent in a prescription for your albuterol.

## 2011-06-08 ENCOUNTER — Encounter: Payer: Self-pay | Admitting: Family Medicine

## 2011-06-17 NOTE — Assessment & Plan Note (Signed)
I will provide the patient with a refill on his Roxicet. I discussed with him the idea of seeing a Psychologist, sport and exercise. He is not currently interested in this. I also discussed with him the importance of physical therapy and he reiterated that he cannot afford it. As such I am not certain if there are additional management options for this patient. I may consider sending him to orthopedics to see if they would consider him for epidural steroid injection. Patient is to return in about a month and we will further discuss surgical referral at that time.

## 2011-07-11 ENCOUNTER — Encounter: Payer: Self-pay | Admitting: Family Medicine

## 2011-07-11 ENCOUNTER — Ambulatory Visit (INDEPENDENT_AMBULATORY_CARE_PROVIDER_SITE_OTHER): Payer: Medicare Other | Admitting: Family Medicine

## 2011-07-11 VITALS — BP 125/75 | HR 85 | Ht 69.0 in | Wt 187.0 lb

## 2011-07-11 DIAGNOSIS — M25475 Effusion, left foot: Secondary | ICD-10-CM

## 2011-07-11 DIAGNOSIS — Z23 Encounter for immunization: Secondary | ICD-10-CM

## 2011-07-11 DIAGNOSIS — G548 Other nerve root and plexus disorders: Secondary | ICD-10-CM

## 2011-07-11 DIAGNOSIS — M25559 Pain in unspecified hip: Secondary | ICD-10-CM

## 2011-07-11 MED ORDER — OXYCODONE-ACETAMINOPHEN 5-325 MG PO TABS: 1.0000 | ORAL_TABLET | Freq: Three times a day (TID) | ORAL | Status: DC | PRN

## 2011-07-11 MED ORDER — GABAPENTIN 300 MG PO CAPS: 600.0000 mg | ORAL_CAPSULE | Freq: Three times a day (TID) | ORAL | Status: DC

## 2011-07-11 NOTE — Progress Notes (Signed)
Patient ID: [name], male   DOB: [birth year], 78 y.o.   MRN: [id number] Subjective: The patient is a 78 y.o. year old male who presents today for followup.  1) Leg pain: improved with gabapentin.  Small amount of weakness remaining along with occasional numbness/tingling.  No bowel/bladder symptoms.  Actual back pain is significantly improved. The patient does continue to use occasional Percocet, approximately one every other day. He uses this when his back pain is particularly bad following a day of activity. This is helping him to do things such as milk a long period  2) Hip pain: over left hip on lateral side, present for the last week.  Did have to fix the starter on his Lucianne Lei (on his back on the ground) that might have started it off.  May be slightly worse in the morning, although it does cause him some problems when he sleeps.  3) Health maintenance: Thinks he may have had a pneumovax sometime in the past at the [place] but can't quite remember.  Patient's past medical, social, and family history were reviewed and updated as appropriate. Smoking status: Continues to smoke occasionally.  Not interested in quitting.  Objective:  Filed Vitals:   07/11/11 1322  BP: 125/75  Pulse: 85   Gen: NAD CV: RRR Resp: CTABL Ext: 5+ strength bothe lower extremities, normal reflexes bilaterally in the upper extremities. Reflexes are slightly diminished at the patella bilaterally.  Assessment/Plan:  Please also see individual problems in problem list for problem-specific plans.

## 2011-07-11 NOTE — Assessment & Plan Note (Signed)
This would appear to be trochanteric bursitis. The patient is not interested in any treatment at this time. I've advised him on the importance of rest, ice, and avoiding activity that worsens it. I also discussed with him the possibility of an injection in the future. The patient is instructed to return if he is interested in this.

## 2011-07-11 NOTE — Patient Instructions (Signed)
It was great to see you today! I have sent in a refill on your gabapentin.  I want you to continue taking 600mg  three times per day. If you hip pain does not get better in the next week or two, come back and we can talk about an injection. Otherwise, come back to see me in about 3 months.

## 2011-07-11 NOTE — Assessment & Plan Note (Signed)
Significantly improved on Neurontin with oxycodone when necessary. I have indicated to the patient that I wish to gradually move him off of the oxycodone. I will provide him a refill of 30 additional pills which, his current rate of usage, will likely be good for about 2 months. Also continue him on his current dose of Neurontin. He might benefit from a slightly higher dose, however he does experience some problems with sedation for the medicine to hold off for now.

## 2011-09-09 ENCOUNTER — Encounter: Payer: Self-pay | Admitting: Family Medicine

## 2011-09-09 ENCOUNTER — Ambulatory Visit (INDEPENDENT_AMBULATORY_CARE_PROVIDER_SITE_OTHER): Payer: Medicare Other | Admitting: Family Medicine

## 2011-09-09 VITALS — BP 128/71 | HR 92 | Temp 98.5°F | Ht 69.0 in | Wt 190.5 lb

## 2011-09-09 DIAGNOSIS — R5383 Other fatigue: Secondary | ICD-10-CM

## 2011-09-09 DIAGNOSIS — R05 Cough: Secondary | ICD-10-CM

## 2011-09-09 DIAGNOSIS — R059 Cough, unspecified: Secondary | ICD-10-CM

## 2011-09-09 DIAGNOSIS — R5381 Other malaise: Secondary | ICD-10-CM

## 2011-09-09 DIAGNOSIS — G548 Other nerve root and plexus disorders: Secondary | ICD-10-CM

## 2011-09-09 LAB — BASIC METABOLIC PANEL
BUN: 25 mg/dL — ABNORMAL HIGH (ref 6–23)
CO2: 24 mEq/L (ref 19–32)
Calcium: 9.4 mg/dL (ref 8.4–10.5)
Chloride: 106 mEq/L (ref 96–112)
Creat: 1.27 mg/dL (ref 0.50–1.35)
Glucose, Bld: 94 mg/dL (ref 70–99)
Potassium: 5 mEq/L (ref 3.5–5.3)
Sodium: 140 mEq/L (ref 135–145)

## 2011-09-09 LAB — CBC WITH DIFFERENTIAL/PLATELET
Basophils Absolute: 0.1 10*3/uL (ref 0.0–0.1)
Basophils Relative: 0 % (ref 0–1)
Eosinophils Absolute: 1 10*3/uL — ABNORMAL HIGH (ref 0.0–0.7)
Eosinophils Relative: 7 % — ABNORMAL HIGH (ref 0–5)
HCT: 40.1 % (ref 39.0–52.0)
Hemoglobin: 13.5 g/dL (ref 13.0–17.0)
Lymphocytes Relative: 34 % (ref 12–46)
Lymphs Abs: 4.6 10*3/uL — ABNORMAL HIGH (ref 0.7–4.0)
MCH: 26.3 pg (ref 26.0–34.0)
MCHC: 33.7 g/dL (ref 30.0–36.0)
MCV: 78.2 fL (ref 78.0–100.0)
Monocytes Absolute: 1 10*3/uL (ref 0.1–1.0)
Monocytes Relative: 8 % (ref 3–12)
Neutro Abs: 6.9 10*3/uL (ref 1.7–7.7)
Neutrophils Relative %: 51 % (ref 43–77)
Platelets: 224 10*3/uL (ref 150–400)
RBC: 5.13 MIL/uL (ref 4.22–5.81)
RDW: 16.1 % — ABNORMAL HIGH (ref 11.5–15.5)
WBC: 13.5 10*3/uL — ABNORMAL HIGH (ref 4.0–10.5)

## 2011-09-09 LAB — VITAMIN B12: Vitamin B-12: 238 pg/mL (ref 211–911)

## 2011-09-09 LAB — TSH: TSH: 2.461 u[IU]/mL (ref 0.350–4.500)

## 2011-09-09 MED ORDER — GABAPENTIN 300 MG PO CAPS: 600.0000 mg | ORAL_CAPSULE | Freq: Three times a day (TID) | ORAL | Status: DC

## 2011-09-09 MED ORDER — RANITIDINE HCL 150 MG PO TABS: 150.0000 mg | ORAL_TABLET | Freq: Two times a day (BID) | ORAL | Status: DC

## 2011-09-09 NOTE — Progress Notes (Signed)
Subjective: The patient is a 78 y.o. year old male who presents today for followup.  1. Back pain: Patient's back pain is still present but significantly better than in the past. He is not having any problems with weakness. Back pain is still located in the lower back with some radiation to the leg. He is not really having to use any pain medication. He continues on his gabapentin which appears to provide some relief. No bowel or bladder symptoms.  2. fatigue: Patient reports that for the last several months he has been having problems with feeling fatigued. He reports that he just feels somewhat run down and has little energy. He is sleeping all right. He has not had any changes in appetite. He has put on a little bit of weight. He is not exercising.  3. Itching: Patient reports that, for the last several days, he has had some itching in his genital area. Is not noticed any rash. This is a mild problem. He has not tried any medication for this.  4. Cough: Patient has been having some problems with a cough and scratchy throat. This been going on for several months. Cough is relatively nonproductive. Symptoms appear to be worse in the morning. No fevers, chills, weight loss, shortness of breath.  Patient's past medical, social, and family history were reviewed and updated as appropriate. History  Substance Use Topics  . Smoking status: Former Smoker -- 0.5 packs/day    Types: Cigars  . Smokeless tobacco: Never Used  . Alcohol Use: No   Objective:  Filed Vitals:   09/09/11 1547  BP: 128/71  Pulse: 92  Temp: 98.5 F (36.9 C)   Gen: No acute distress, appropriate weight CV: Regular rate and rhythm, no murmurs appreciated Resp: Clear to auscultation bilaterally. There are no focal findings. No crackles. Back: There is no focal tenderness. There is no deformity. Ext: 2+ pulses, no edema  Assessment/Plan: Will recommend over-the-counter treatment for possible jock itch.  Please also see  individual problems in problem list for problem-specific plans.

## 2011-09-09 NOTE — Patient Instructions (Signed)
It was good to see you today! I am giving you a prescription for the medicine ranitidine. Take this twice daily. It is to help with her cough and scratchy throat. And getting some blood work today to check for any reasons for you feeling tired all the time. If, at some point in the future, you decided to want another injection in her back to me know and I will set up. I would recommend getting something from the drug store to try for jock itch.  Use it for the next week or so.

## 2011-09-10 LAB — VITAMIN D 25 HYDROXY (VIT D DEFICIENCY, FRACTURES): Vit D, 25-Hydroxy: 22 ng/mL — ABNORMAL LOW (ref 30–89)

## 2011-09-12 ENCOUNTER — Encounter: Payer: Self-pay | Admitting: Home Health Services

## 2011-09-14 DIAGNOSIS — R059 Cough, unspecified: Secondary | ICD-10-CM | POA: Insufficient documentation

## 2011-09-14 DIAGNOSIS — R05 Cough: Secondary | ICD-10-CM | POA: Insufficient documentation

## 2011-09-14 DIAGNOSIS — R051 Acute cough: Secondary | ICD-10-CM | POA: Insufficient documentation

## 2011-09-14 NOTE — Assessment & Plan Note (Signed)
I will go ahead and obtain basic blood work to determine if there is an easy reversible cause. I wonder if some of this may be related to his gabapentin. Over the next several months we may try decreasing the dose on is not we have gotten some level of control of his neuropathic symptoms.

## 2011-09-14 NOTE — Assessment & Plan Note (Signed)
The patient's description of his cough makes me wonder about one of 2 items. The first is reflux and the second is postnasal drip. I will give the patient a trial of ranitidine and see him back in several weeks. With a normal heart and lung exam I think the possibility of a COPD exacerbation or other more serious cause of his symptoms is relatively minimal.

## 2011-09-14 NOTE — Assessment & Plan Note (Signed)
The patient's pain is generally improving. We are in the process of decreasing his narcotic pain medication. His gabapentin appears to be working with neuropathic symptoms. I suggested the idea of a repeat epidural steroid injection and the patient did not feel that his pain was bad enough to merit this at this time.

## 2011-09-23 ENCOUNTER — Other Ambulatory Visit: Payer: Self-pay | Admitting: *Deleted

## 2011-09-23 MED ORDER — GABAPENTIN 300 MG PO CAPS: 600.0000 mg | ORAL_CAPSULE | Freq: Three times a day (TID) | ORAL | Status: DC

## 2011-10-10 ENCOUNTER — Other Ambulatory Visit: Payer: Self-pay | Admitting: Family Medicine

## 2011-10-10 ENCOUNTER — Telehealth: Payer: Self-pay | Admitting: *Deleted

## 2011-10-10 ENCOUNTER — Ambulatory Visit (INDEPENDENT_AMBULATORY_CARE_PROVIDER_SITE_OTHER): Payer: Medicare Other | Admitting: Family Medicine

## 2011-10-10 ENCOUNTER — Encounter: Payer: Self-pay | Admitting: Family Medicine

## 2011-10-10 VITALS — BP 144/74 | HR 78 | Temp 98.2°F | Ht 69.0 in | Wt 192.6 lb

## 2011-10-10 DIAGNOSIS — J449 Chronic obstructive pulmonary disease, unspecified: Secondary | ICD-10-CM

## 2011-10-10 DIAGNOSIS — D72829 Elevated white blood cell count, unspecified: Secondary | ICD-10-CM | POA: Insufficient documentation

## 2011-10-10 DIAGNOSIS — G548 Other nerve root and plexus disorders: Secondary | ICD-10-CM

## 2011-10-10 DIAGNOSIS — R490 Dysphonia: Secondary | ICD-10-CM

## 2011-10-10 DIAGNOSIS — M25559 Pain in unspecified hip: Secondary | ICD-10-CM

## 2011-10-10 LAB — HEPATIC FUNCTION PANEL
ALT: 8 U/L (ref 0–53)
AST: 13 U/L (ref 0–37)
Albumin: 4.1 g/dL (ref 3.5–5.2)
Alkaline Phosphatase: 79 U/L (ref 39–117)
Bilirubin, Direct: 0.1 mg/dL (ref 0.0–0.3)
Indirect Bilirubin: 0.3 mg/dL (ref 0.0–0.9)
Total Bilirubin: 0.4 mg/dL (ref 0.3–1.2)
Total Protein: 6.9 g/dL (ref 6.0–8.3)

## 2011-10-10 NOTE — Patient Instructions (Signed)
We are referring you to ENT.  You will find out about the appointment in a week or so. I also want you to get some lung tests done.  We will go ahead and schedule these and let you know about them either today or tomorrow.  Please do not take your Advair for 3 days prior to this test. I need to check 2 additional things about your blood work.

## 2011-10-10 NOTE — Progress Notes (Signed)
Patient ID: [name], male   DOB: [birth year], 78 y.o.   MRN: [id number] Subjective: The patient is a 78 y.o. year old male who presents today for f/u.  1.  Hoarseness: patient continues to have problems with hoarseness.  Symptoms are essentially unchanged from his last visit.  He has tried one month of ranitidine which he reports has been completely ineffective in reducing symptoms.  He denies any weight loss, fevers, chills, or dysphasia.  2.  Malaise: the patient continues to report problems with generalized malaise.  Reports getting tired out of breath easily.  This is going on last six months but has been becoming progressively worse.  He denies any abdominal pain, constipation or diarrhea, or problems with incontinence.  Laboratory work obtained at his last visit was essentially normal except for a mildly elevated white count.  3.  Back pain: patient continues to have some intermittent low back pain along with left hip and groin pain.  Pain is primarily bothers him when going from seated to standing and while walking.  He does not describe any shooting pains down his leg denies any numbness in his extremities.  He is not having any bowel or bladder problems.  He is also not complaining of some left knee pain is been going on for the last 1 to 2 months.  There was no inciting event for this.  Patient's past medical, social, and family history were reviewed and updated as appropriate. History  Substance Use Topics  . Smoking status: Former Smoker -- 0.5 packs/day    Types: Cigars  . Smokeless tobacco: Never Used  . Alcohol Use: No   Objective:  Filed Vitals:   10/10/11 1430  BP: 144/74  Pulse: 78  Temp: 98.2 F (36.8 C)  O2 sats 95% with ambulation Gen: NAD, somewhat downcast affect CV: RRR Resp: CTABL, decreased breath sounds Ext: No edema.  No swelling of left knee but pain with movement (esp flexion/extension) or firm palpation over lateral joint line.  Straight leg raise  causes some pain in the posterior gluteal region on the left but is normal on the right.  Hip flexion is significantly limited (~45-50 degrees from horizontal).  Pelvic rock is negative.  Assessment/Plan:  Please also see individual problems in problem list for problem-specific plans.

## 2011-10-10 NOTE — Telephone Encounter (Signed)
Attempted to call pt. of the following there is no VM set up:  appt is August 12,2013 @ 130 w/Dr. Irean [facility] [place] [address] Phone # [phone]  Pt will need to bring Insurance card, ID, list of meds he is currently taking, and co pay. If pt cannot keep this appt he will need to call their office to cancel/reschedule or he may be charged a fee.Audelia Hives [place]

## 2011-10-11 ENCOUNTER — Ambulatory Visit (INDEPENDENT_AMBULATORY_CARE_PROVIDER_SITE_OTHER): Payer: Medicare Other | Admitting: Home Health Services

## 2011-10-11 ENCOUNTER — Encounter: Payer: Self-pay | Admitting: Home Health Services

## 2011-10-11 VITALS — BP 125/83 | HR 92 | Temp 97.2°F | Ht 69.0 in | Wt 191.4 lb

## 2011-10-11 DIAGNOSIS — Z13220 Encounter for screening for lipoid disorders: Secondary | ICD-10-CM

## 2011-10-11 DIAGNOSIS — Z0000 Encounter for general adult medical examination without abnormal findings: Secondary | ICD-10-CM

## 2011-10-11 LAB — PATHOLOGIST SMEAR REVIEW

## 2011-10-11 LAB — LDL CHOLESTEROL, DIRECT: Direct LDL: 75 mg/dL

## 2011-10-11 MED ORDER — OXYCODONE-ACETAMINOPHEN 5-325 MG PO TABS: 1.0000 | ORAL_TABLET | Freq: Three times a day (TID) | ORAL | Status: DC | PRN

## 2011-10-11 NOTE — Patient Instructions (Signed)
1. Continue to swim daily at [facility]. 2. Try focusing on eating 3-5 fruits and vegetables a day. 3. Limit fried foods and sweets. 4. Continue to work on weight loss.

## 2011-10-11 NOTE — Addendum Note (Signed)
Addended by: Vickie Epley D on: 10/11/2011 02:35 PM   Modules accepted: Orders

## 2011-10-11 NOTE — Assessment & Plan Note (Signed)
Review of the patient's labs demonstrates a persistently elevated white count.  Given the patient's constellation of symptoms some form of occult malignancy such as MGUS or multiple myeloma needs to be considered.  The patient is not had a peripheral smear performed.  I will order this today.  I will also order hepatic function tests to determine if there is an elevation of alk phos.

## 2011-10-11 NOTE — Assessment & Plan Note (Signed)
The patient's symptoms are likely related to his herniated disc.  I feel that his knee pain is likely secondary to overcompensation from his back pain.  I would like to encourage the patient to think about seeing physical therapy again but at this point in time he is more concerned with his general fatigue.  We will address this again in the future.

## 2011-10-11 NOTE — Progress Notes (Signed)
Patient here for annual wellness visit, patient reports: Risk Factors/Conditions needing evaluation or treatment: Pt does not any any new risk factors that need evaluation.  Home Safety: Pt lives with wife, daughter, and granddaughter in 77 story home.  Pt reports having smoke detectors. Other Information: Corrective lens: Pt wears daily corrective lens.  Does not have eye dr appointment regulalrly. Dentures: Pt has full dentures. Memory: Pt reports some memory problems. Patient's Mini Mental Score (recorded in doc. flowsheet): 28  Balance/Gait: Pt has lot's of pain in left hip- impair balances Balance Abnormal Patient value  Sitting balance    Sit to stand x Must use arms  Attempts to arise    Immediate standing balance    Standing balance    Nudge    Eyes closed- Romberg x dizzy  Tandem stance x unable  Back lean x dizzy  Neck Rotation x dizzy  360 degree turn    Sitting down x Uses arms   Gait Abnormal Patient value  Initiation of gait    Step length-left x   Step length-right    Step height-left x   Step height-right    Step symmetry x   Step continuity    Path deviation    Trunk movement x stiff  Walking stance        Annual Wellness Visit Requirements Recorded Today In  Medical, family, social history Past Medical, Family, Social History Section  Current providers Care team  Current medications Medications  Wt, BP, Ht, BMI Vital signs  Tobacco, alcohol, illicit drug use History  ADL Nurse Assessment  Depression Screening Nurse Assessment  Cognitive impairment Nurse Assessment  Mini Mental Status Document Flowsheet  Fall Risk Nurse Assessment  Home Safety Progress Note  End of Life Planning (welcome visit) Social Documentation  Medicare preventative services Progress Note  Risk factors/conditions needing evaluation/treatment Progress Note  Personalized health advice Patient Instructions, goals, letter  Diet & Exercise Social Documentation  Emergency  Contact Social Documentation  Seat Belts Social Documentation  Sun exposure/protection Social Documentation    Medicare Prevention Plan:   Recommended Medicare Prevention Screenings Men over 65 Test For Frequency Date of Last- BOLD if needed  Colorectal Cancer 1-10 yrs 11/08  Prostate Cancer Never or yearly NI  Aortic Aneurysm Once if 65-75 with hx of smoking NI due to age  Cholesterol 5 yrs 10/11/11  Diabetes yearly 7/13  HIV yearly declined  Influenza Shot yearly 10/12  Pneumonia Shot once 5/13  Zostavax Shot once discussed-not interested

## 2011-10-11 NOTE — Telephone Encounter (Signed)
Spoke with pt's wife and gave her the appt information for her husband. She voiced understanding.Maryruth Eve, Lahoma Crocker

## 2011-10-11 NOTE — Progress Notes (Signed)
I have reviewed this visit and discussed with Suzanne Lineberry and agree with her documentation  

## 2011-10-11 NOTE — Assessment & Plan Note (Signed)
Persistent hoarseness in an elderly male who is a former smoker.  I will refer to ENT for evaluation for possible head and neck cancer.

## 2011-10-20 ENCOUNTER — Ambulatory Visit: Payer: Medicare Other | Admitting: Pharmacist

## 2011-10-24 ENCOUNTER — Other Ambulatory Visit: Payer: Self-pay | Admitting: *Deleted

## 2011-10-24 MED ORDER — FLUTICASONE-SALMETEROL 250-50 MCG/DOSE IN AEPB: 1.0000 | INHALATION_SPRAY | Freq: Two times a day (BID) | RESPIRATORY_TRACT | Status: DC

## 2011-11-08 ENCOUNTER — Encounter: Payer: Self-pay | Admitting: Pharmacist

## 2011-11-08 ENCOUNTER — Ambulatory Visit (INDEPENDENT_AMBULATORY_CARE_PROVIDER_SITE_OTHER): Payer: Medicare Other | Admitting: Pharmacist

## 2011-11-08 VITALS — Ht 71.5 in | Wt 191.0 lb

## 2011-11-08 DIAGNOSIS — R490 Dysphonia: Secondary | ICD-10-CM

## 2011-11-08 DIAGNOSIS — J449 Chronic obstructive pulmonary disease, unspecified: Secondary | ICD-10-CM

## 2011-11-08 MED ORDER — TIOTROPIUM BROMIDE MONOHYDRATE 18 MCG IN CAPS: 18.0000 ug | ORAL_CAPSULE | Freq: Every day | RESPIRATORY_TRACT | Status: DC

## 2011-11-08 NOTE — Progress Notes (Signed)
Patient ID: [name], male   DOB: Aug 25, 1933, 78 y.o.   MRN: [id number] Reviewed and agree with Dr. Graylin Shiver management and documentation.

## 2011-11-08 NOTE — Progress Notes (Signed)
  Subjective:    Patient ID: [name], male    DOB: October 23, 1933, 78 y.o.   MRN: [id number]  HPI Pt presents for spirometry testing in good spirits. History of asthma since a teenager; so pt unable to distinguish when COPD symptoms of "air retention" began.  Smoked cigarettes since a teenager, switching to cigars many years ago; can't remember exactly when.  Has made numerous short-lived quit attempts in past years; recently has been abstinent since April.  Patient reports being diagnosed by Dr. Netty Starring several years ago.       Review of Systems     Objective:   Physical Exam See Documentation Flowsheet (discrete results - PFTs) for complete Spirometry results. Patient provided good effort while attempting spirometry.   2-lap walk around the building resulted in 92% O2 sat.         Assessment & Plan:  Spirometry evaluation reveals Severe obstructive lung disease, compounded by moderate asthma.  Patient has been experiencing shortness of breath for many years and taking Advair 250/50 BID with PRN albuterol HFA. begin Spiriva 18 mcg inhaler once daily in addition to Advair. Reviewed results of pulmonary function tests.  Pt verbalized understanding of results.  Written pt instructions provided.  F/U Clinic visit with Dr. Jess Barters at beginning of October. Total time in face to face counseling 45 minutes.  Patient seen with Ocie Doyne, PharmD, Pharmacy Resident and Reuel Boom, PharmD Candidate. Marland Kitchen

## 2011-11-08 NOTE — Assessment & Plan Note (Signed)
Spirometry evaluation reveals Severe obstructive lung disease, compounded by moderate asthma.  Patient has been experiencing shortness of breath for many years and taking Advair 250/50 BID with PRN albuterol HFA. begin Spiriva 18 mcg inhaler once daily in addition to Advair. Reviewed results of pulmonary function tests.  Pt verbalized understanding of results.  Written pt instructions provided.  F/U Clinic visit with Dr. Jess Barters at beginning of October. Total time in face to face counseling 45 minutes.  Patient seen with Ocie Doyne, PharmD, Pharmacy Resident and Reuel Boom, PharmD Candidate.

## 2011-11-08 NOTE — Patient Instructions (Addendum)
Lung function test today showed Very Severe Obstruction   Continue all medications as you have been taking them.   Start taking SPIRIVA (tiotropium) 1 inhalation once daily  Follow up with Dr. Jess Barters at the end of the month or early October.

## 2011-11-30 ENCOUNTER — Other Ambulatory Visit: Payer: Self-pay | Admitting: *Deleted

## 2011-12-05 ENCOUNTER — Ambulatory Visit (INDEPENDENT_AMBULATORY_CARE_PROVIDER_SITE_OTHER): Payer: Medicare Other | Admitting: Family Medicine

## 2011-12-05 ENCOUNTER — Encounter: Payer: Self-pay | Admitting: Family Medicine

## 2011-12-05 VITALS — BP 149/81 | HR 75 | Temp 97.9°F | Ht 71.5 in | Wt 193.6 lb

## 2011-12-05 DIAGNOSIS — M25559 Pain in unspecified hip: Secondary | ICD-10-CM

## 2011-12-05 DIAGNOSIS — J449 Chronic obstructive pulmonary disease, unspecified: Secondary | ICD-10-CM

## 2011-12-05 DIAGNOSIS — Z23 Encounter for immunization: Secondary | ICD-10-CM

## 2011-12-05 DIAGNOSIS — D72829 Elevated white blood cell count, unspecified: Secondary | ICD-10-CM

## 2011-12-05 MED ORDER — ALLOPURINOL 300 MG PO TABS: 300.0000 mg | ORAL_TABLET | Freq: Every day | ORAL | Status: DC

## 2011-12-05 MED ORDER — FLUTICASONE-SALMETEROL 500-50 MCG/DOSE IN AEPB: 1.0000 | INHALATION_SPRAY | Freq: Two times a day (BID) | RESPIRATORY_TRACT | Status: DC

## 2011-12-05 NOTE — Assessment & Plan Note (Signed)
Review of path report shows no signs of occult malignancy.  In absence of other symptoms or abnormalities of blood work will plan simple observation as spep/upep seem a bit overkill at this time.

## 2011-12-05 NOTE — Patient Instructions (Signed)
It was good to see you today! We are increasing your Advair to the 500/50 dose today. If you change your mind about seeing an orthopedist, let us know. Come back to see me in about 3 months.

## 2011-12-05 NOTE — Assessment & Plan Note (Signed)
Most consistent with OA of hip.  Have offered referral to ortho for eval for injection vs replacement but patient is not interested at this time.

## 2011-12-05 NOTE — Assessment & Plan Note (Signed)
Will increase advair to 500/50 and see if this helps with symptoms.  Unfortunately, some of his reported symptoms stem from both his breathing and his hip pain and, since he is not interested in pursuing further treatment of his hip pain, separating the two will be hard.

## 2011-12-05 NOTE — Progress Notes (Signed)
Patient ID: [name], male   DOB: May 05, 1933, 78 y.o.   MRN: [id number] Subjective: The patient is a 78 y.o. year old male who presents today for f/u.  1. COPD: Started on Spiriva by pharmacy.  Not noticing much improvement.  Still gets winded with walking significant distances.  Cannot do significant activity.  No longer smoking.  No chest pain.  No significant cough or sputum production.  2. Left hip pain: Patient continues to have problems with left groin pain with movement of his left hip.  He has prior x-rays showing near bone-on-bone arthritis of this hip.  His problems have increased since his nerve impingement last January.  Patient's past medical, social, and family history were reviewed and updated as appropriate. History  Substance Use Topics  . Smoking status: Former Smoker -- 0.5 packs/day    Types: Cigars    Quit date: 06/08/2011  . Smokeless tobacco: Never Used  . Alcohol Use: No   Objective:  Filed Vitals:   12/05/11 1138  BP: 149/81  Pulse: 75  Temp: 97.9 F (36.6 C)   Gen: NAD CV: RRR, no murmurs Resp: CTABL, severely decreased breath sounds throughout Ext: No edema  Assessment/Plan:  Please also see individual problems in problem list for problem-specific plans.

## 2011-12-06 DIAGNOSIS — Z23 Encounter for immunization: Secondary | ICD-10-CM

## 2012-01-02 ENCOUNTER — Other Ambulatory Visit: Payer: Self-pay | Admitting: Family Medicine

## 2012-01-02 MED ORDER — GABAPENTIN 300 MG PO CAPS: 600.0000 mg | ORAL_CAPSULE | Freq: Three times a day (TID) | ORAL | Status: DC

## 2012-01-13 ENCOUNTER — Other Ambulatory Visit: Payer: Self-pay | Admitting: Family Medicine

## 2012-01-13 DIAGNOSIS — G548 Other nerve root and plexus disorders: Secondary | ICD-10-CM

## 2012-03-08 ENCOUNTER — Other Ambulatory Visit: Payer: Self-pay | Admitting: *Deleted

## 2012-03-08 MED ORDER — LOSARTAN POTASSIUM 25 MG PO TABS: 25.0000 mg | ORAL_TABLET | Freq: Every day | ORAL | Status: DC

## 2012-05-02 ENCOUNTER — Other Ambulatory Visit: Payer: Self-pay | Admitting: *Deleted

## 2012-05-02 MED ORDER — GABAPENTIN 300 MG PO CAPS: 600.0000 mg | ORAL_CAPSULE | Freq: Three times a day (TID) | ORAL | Status: DC

## 2012-06-04 ENCOUNTER — Encounter: Payer: Self-pay | Admitting: Family Medicine

## 2012-06-04 ENCOUNTER — Ambulatory Visit (INDEPENDENT_AMBULATORY_CARE_PROVIDER_SITE_OTHER): Payer: Medicare Other | Admitting: Family Medicine

## 2012-06-04 VITALS — BP 127/79 | HR 85 | Temp 97.9°F | Ht 71.5 in | Wt 198.7 lb

## 2012-06-04 DIAGNOSIS — I10 Essential (primary) hypertension: Secondary | ICD-10-CM

## 2012-06-04 DIAGNOSIS — G548 Other nerve root and plexus disorders: Secondary | ICD-10-CM

## 2012-06-04 DIAGNOSIS — R5381 Other malaise: Secondary | ICD-10-CM

## 2012-06-04 DIAGNOSIS — F528 Other sexual dysfunction not due to a substance or known physiological condition: Secondary | ICD-10-CM

## 2012-06-04 DIAGNOSIS — J449 Chronic obstructive pulmonary disease, unspecified: Secondary | ICD-10-CM

## 2012-06-04 DIAGNOSIS — R5383 Other fatigue: Secondary | ICD-10-CM

## 2012-06-04 LAB — COMPREHENSIVE METABOLIC PANEL
ALT: 14 U/L (ref 0–53)
AST: 15 U/L (ref 0–37)
Albumin: 4 g/dL (ref 3.5–5.2)
Alkaline Phosphatase: 84 U/L (ref 39–117)
BUN: 16 mg/dL (ref 6–23)
CO2: 21 mEq/L (ref 19–32)
Calcium: 8.9 mg/dL (ref 8.4–10.5)
Chloride: 106 mEq/L (ref 96–112)
Creat: 1.12 mg/dL (ref 0.50–1.35)
Glucose, Bld: 104 mg/dL — ABNORMAL HIGH (ref 70–99)
Potassium: 4.1 mEq/L (ref 3.5–5.3)
Sodium: 137 mEq/L (ref 135–145)
Total Bilirubin: 0.5 mg/dL (ref 0.3–1.2)
Total Protein: 6.7 g/dL (ref 6.0–8.3)

## 2012-06-04 MED ORDER — OXYCODONE-ACETAMINOPHEN 5-325 MG PO TABS: 1.0000 | ORAL_TABLET | Freq: Three times a day (TID) | ORAL | Status: DC | PRN

## 2012-06-04 MED ORDER — DULOXETINE HCL 30 MG PO CPEP: 30.0000 mg | ORAL_CAPSULE | Freq: Every day | ORAL | Status: DC

## 2012-06-04 NOTE — Patient Instructions (Signed)
It was great to see you today! We will check your testosterone level today to see if that is contributing to your fatigue. You can try decreasing your gabapentin to 300mg  three times per day and see if your pain changes. I would also like you to start taking Cymbalta, 30 mg one time per day to see if it helps with your pain any. When you come back in September, we should check your oxygen level when you are walking so we can make sure you don't need oxygen.

## 2012-06-05 LAB — CBC WITH DIFFERENTIAL/PLATELET
Basophils Absolute: 0 10*3/uL (ref 0.0–0.1)
Basophils Relative: 0 % (ref 0–1)
Eosinophils Absolute: 0.5 10*3/uL (ref 0.0–0.7)
Eosinophils Relative: 4 % (ref 0–5)
HCT: 41.1 % (ref 39.0–52.0)
Hemoglobin: 14.2 g/dL (ref 13.0–17.0)
Lymphocytes Relative: 29 % (ref 12–46)
Lymphs Abs: 3.9 10*3/uL (ref 0.7–4.0)
MCH: 26.2 pg (ref 26.0–34.0)
MCHC: 34.5 g/dL (ref 30.0–36.0)
MCV: 76 fL — ABNORMAL LOW (ref 78.0–100.0)
Monocytes Absolute: 1 10*3/uL (ref 0.1–1.0)
Monocytes Relative: 8 % (ref 3–12)
Neutro Abs: 7.8 10*3/uL — ABNORMAL HIGH (ref 1.7–7.7)
Neutrophils Relative %: 59 % (ref 43–77)
Platelets: 173 10*3/uL (ref 150–400)
RBC: 5.41 MIL/uL (ref 4.22–5.81)
RDW: 16.9 % — ABNORMAL HIGH (ref 11.5–15.5)
WBC: 13.2 10*3/uL — ABNORMAL HIGH (ref 4.0–10.5)

## 2012-06-05 LAB — TESTOSTERONE: Testosterone: 194 ng/dL — ABNORMAL LOW (ref 300–890)

## 2012-06-08 ENCOUNTER — Encounter: Payer: Self-pay | Admitting: Family Medicine

## 2012-06-08 NOTE — Assessment & Plan Note (Signed)
Stable. Patient is already on maximal therapy. Would plan to do ambulatory 02 checks approximately once per year to see if the patient will benefit from oxygen. Otherwise we will continue his medications.

## 2012-06-08 NOTE — Progress Notes (Signed)
Patient ID: [name], male   DOB: Nov 17, 1933, 79 y.o.   MRN: [id number] Subjective: The patient is a 79 y.o. year old male who presents today for routine followup.  1. Back pain: Patient continues to have some problems with pain in his lower back radiating down his left leg. This is made worse by bending forward or prolonged standing. He is taking 300 mg gabapentin 3 times a day. Is not having any bowel or bladder symptoms.  2. Fatigue: Patient continues to have problems with fatigue. He reports that he gets plenty of sleep and has good sleep quality but just feels tired all the time. This is not particularly worse in the past, however continues to be problematic for him.  3. Hypertension: Continues on medications. No side effects. Denies chest pain, new shortness of breath, visual changes, headaches, or leg swelling.  4. COPD: Patient continues to have problems with poor exercise tolerance. He is taking his medications as prescribed. He does not have a significant problem with cough. He denies any fevers or chills. As of last check, he did not require oxygen for ambulation. His symptoms of shortness of breath with exertion are perhaps slightly worse than 6 months ago but not significantly.  Patient's past medical, social, and family history were reviewed and updated as appropriate. History  Substance Use Topics  . Smoking status: Former Smoker -- 0.50 packs/day    Types: Cigars    Quit date: 06/08/2011  . Smokeless tobacco: Never Used  . Alcohol Use: No   Objective:  Filed Vitals:   06/04/12 1348  BP: 127/79  Pulse: 85  Temp: 97.9 F (36.6 C)   Gen: No acute distress CV: Regular rate and rhythm, no murmurs appreciated Resp: Clear to auscultation bilaterally but significantly decreased globally Ext: 2+ pulses, no significant edema  Assessment/Plan:  Please also see individual problems in problem list for problem-specific plans.

## 2012-06-08 NOTE — Assessment & Plan Note (Signed)
We will try adding Cymbalta to the patient's regimen to see if we can improve pain control. He will give a trial of reducing his dose of gabapentin as this may be contributing to his fatigue.

## 2012-06-08 NOTE — Assessment & Plan Note (Signed)
Reduced dose of gabapentin to see if this helps with fatigue. We'll recheck thyroid, iron level, B12, and fairly comprehensive other laboratory findings. They have all been within the normal range. We will check testosterone level as this may be contributing.

## 2012-06-08 NOTE — Assessment & Plan Note (Signed)
At goal. Continue current medications. Check creatinine and electrolytes.

## 2012-07-23 ENCOUNTER — Other Ambulatory Visit: Payer: Self-pay | Admitting: *Deleted

## 2012-07-23 MED ORDER — ALLOPURINOL 300 MG PO TABS: 300.0000 mg | ORAL_TABLET | Freq: Every day | ORAL | Status: DC

## 2012-08-29 ENCOUNTER — Encounter: Payer: Self-pay | Admitting: Family Medicine

## 2012-08-29 ENCOUNTER — Ambulatory Visit (INDEPENDENT_AMBULATORY_CARE_PROVIDER_SITE_OTHER): Payer: Medicare Other | Admitting: Family Medicine

## 2012-08-29 VITALS — BP 118/72 | HR 108 | Temp 98.2°F | Ht 71.0 in | Wt 192.0 lb

## 2012-08-29 DIAGNOSIS — R109 Unspecified abdominal pain: Secondary | ICD-10-CM

## 2012-08-29 MED ORDER — TRAMADOL HCL 50 MG PO TABS: 100.0000 mg | ORAL_TABLET | Freq: Four times a day (QID) | ORAL | Status: DC | PRN

## 2012-08-29 NOTE — Patient Instructions (Signed)
I am sorry your side is hurting.  I am checking some blood work and getting an ultrasound of your abdomen to check and see what could be causing your pain.   Please try the tramadol, two pills every 6 hours as needed for pain.

## 2012-08-29 NOTE — Progress Notes (Signed)
  Subjective:    Patient ID: [name], male    DOB: September 27, 1933, 79 y.o.   MRN: [id number]  HPI:  [name] comes in complaining of side pain for the past month.  He says the pain is waxing and waning, but getting worse.  It hurts to the touch.  He says it is sometimes as bad as 10/10.  He says it is not the same as a kidney stone, and denies blood in his urine or dysuria.  He says he has not had a fall or injury or hurt his ribs.  He says he has normal bowel movements and no lower abdominal pain.   Past Medical History  Diagnosis Date  . Arthritis   . Hypertension   . CHOLECYSTECTOMY, LAPAROSCOPIC, HX OF 04/12/2007    Qualifier: Diagnosis of  By: Netty Starring  MD, Lucianne Muss    . ANEMIA, IRON DEF, NOS 01/15/2007    Qualifier: Diagnosis of  By: Jeannine Kitten MD, Matthew      History  Substance Use Topics  . Smoking status: Former Smoker -- 0.50 packs/day    Types: Cigars    Quit date: 06/08/2011  . Smokeless tobacco: Never Used  . Alcohol Use: No    Family History  Problem Relation Age of Onset  . Heart disease Brother   . Stroke Brother   . Diabetes Brother   . Cancer Brother     colon     ROS Pertinent items in HPI    Objective:  Physical Exam:  BP 118/72  Pulse 108  Temp(Src) 98.2 F (36.8 C) (Oral)  Ht 5\' 11"  (1.803 m)  Wt 192 lb (87.091 kg)  BMI 26.79 kg/m2 General appearance: alert, cooperative and no distress Head: Normocephalic, without obvious abnormality, atraumatic Lungs: clear to auscultation bilaterally Heart: regular rate and rhythm, S1, S2 normal, no murmur, click, rub or gallop Side: Patient extremely tender to palpation of side, at mid-axillary line just below costal margin.  He has mild tenderness to palpation on the ribs.  Abdomen: +BS, soft.  +LUQ tenderness to palpation.  Unable to palpate spleen, patient discomfort limits exam.  No significant lower quadrant tenderness.           Assessment & Plan:

## 2012-08-29 NOTE — Assessment & Plan Note (Signed)
Unclear etiology, but concerning for intra-abdominal issue such as enlarged spleen.  Will check CBC and abdominal US to evaluate.  Rx tramadol for pain.  F/U in 2 weeks.

## 2012-08-30 LAB — CBC WITH DIFFERENTIAL/PLATELET
Basophils Absolute: 0 10*3/uL (ref 0.0–0.1)
Basophils Relative: 0 % (ref 0–1)
Eosinophils Absolute: 0.5 10*3/uL (ref 0.0–0.7)
Eosinophils Relative: 4 % (ref 0–5)
HCT: 41.2 % (ref 39.0–52.0)
Hemoglobin: 14.2 g/dL (ref 13.0–17.0)
Lymphocytes Relative: 35 % (ref 12–46)
Lymphs Abs: 4.6 10*3/uL — ABNORMAL HIGH (ref 0.7–4.0)
MCH: 25.9 pg — ABNORMAL LOW (ref 26.0–34.0)
MCHC: 34.5 g/dL (ref 30.0–36.0)
MCV: 75 fL — ABNORMAL LOW (ref 78.0–100.0)
Monocytes Absolute: 1 10*3/uL (ref 0.1–1.0)
Monocytes Relative: 8 % (ref 3–12)
Neutro Abs: 7 10*3/uL (ref 1.7–7.7)
Neutrophils Relative %: 53 % (ref 43–77)
Platelets: 202 10*3/uL (ref 150–400)
RBC: 5.49 MIL/uL (ref 4.22–5.81)
RDW: 16.5 % — ABNORMAL HIGH (ref 11.5–15.5)
WBC: 13 10*3/uL — ABNORMAL HIGH (ref 4.0–10.5)

## 2012-08-31 ENCOUNTER — Telehealth: Payer: Self-pay | Admitting: Family Medicine

## 2012-08-31 ENCOUNTER — Ambulatory Visit (HOSPITAL_COMMUNITY)
Admission: RE | Admit: 2012-08-31 | Discharge: 2012-08-31 | Disposition: A | Discharge status: Home / Self Care | Payer: Medicare Other | Source: Ambulatory Visit | Attending: Family Medicine | Admitting: Family Medicine

## 2012-08-31 DIAGNOSIS — Z9089 Acquired absence of other organs: Secondary | ICD-10-CM | POA: Insufficient documentation

## 2012-08-31 DIAGNOSIS — R109 Unspecified abdominal pain: Secondary | ICD-10-CM | POA: Insufficient documentation

## 2012-08-31 NOTE — Telephone Encounter (Signed)
Pt advised of test results, states still hurting, advised pt to make F/U appt. Pt agreed.

## 2012-08-31 NOTE — Telephone Encounter (Signed)
Please call patient, let him know labs and [place] of abdomen normal.  Let him know he can make another appointment if he is not feeling bettter.

## 2012-09-03 ENCOUNTER — Ambulatory Visit (INDEPENDENT_AMBULATORY_CARE_PROVIDER_SITE_OTHER): Payer: Medicare Other | Admitting: Family Medicine

## 2012-09-03 ENCOUNTER — Encounter: Payer: Self-pay | Admitting: Family Medicine

## 2012-09-03 VITALS — BP 118/72 | HR 89 | Temp 98.1°F | Ht 69.0 in | Wt 195.0 lb

## 2012-09-03 DIAGNOSIS — R109 Unspecified abdominal pain: Secondary | ICD-10-CM

## 2012-09-03 DIAGNOSIS — G548 Other nerve root and plexus disorders: Secondary | ICD-10-CM

## 2012-09-03 LAB — POCT URINALYSIS DIPSTICK
Bilirubin, UA: NEGATIVE
Blood, UA: NEGATIVE
Glucose, UA: NEGATIVE
Ketones, UA: NEGATIVE
Leukocytes, UA: NEGATIVE
Nitrite, UA: NEGATIVE
Protein, UA: NEGATIVE
Spec Grav, UA: 1.02
Urobilinogen, UA: 0.2
pH, UA: 6

## 2012-09-03 MED ORDER — OXYCODONE-ACETAMINOPHEN 5-325 MG PO TABS: 1.0000 | ORAL_TABLET | Freq: Three times a day (TID) | ORAL | Status: DC | PRN

## 2012-09-03 NOTE — Progress Notes (Signed)
Patient ID: [name], male   DOB: Jul 06, 1933, 79 y.o.   MRN: [id number]  [facility] Amber M. Hairford, MD Phone: [phone]   Subjective: HPI: Patient is a 79 y.o. male presenting to clinic today for same day appointment for side/flank pain.  Pain in left side has been ongoing for "some time" - approx 2 weeks. Recently had lab work as well as abdominal ultrasound to eval for intraabdominal process, which was negative.  Patient reports pain is all on left flank/side. Not in back or abdomen. Tried Tramadol last week which caused nausea. Pain radiates to left hip and shooting pain into the groin. Has remote history of kidney stones when he is younger.  Reports pain with shirt touching his skin. Has not had Zostavax, but states he might have had chicken pox as a child. He is on Gabapentin daily. Has extensive history of back pain.  History Reviewed: Former smoker. Health Maintenance: UTD  ROS: Please see HPI above.  Objective: Office vital signs reviewed. BP 118/72  Pulse 89  Temp(Src) 98.1 F (36.7 C) (Oral)  Ht 5\' 9"  (1.753 m)  Wt 195 lb (88.451 kg)  BMI 28.78 kg/m2  Physical Examination:  General: Awake, alert. NAD, leaning to right side and slightly forward. HEENT: Atraumatic, normocephalic Pulm: CTAB, no wheezes Cardio: RRR Abdomen: nontender, nondistended Back: Discrete paraesthesia of left flank without rash. No bony tenderness or CVA tenderness. Able to move well and gait is normal. Extremities: No edema Neuro: Grossly intact  Assessment: 79 y.o. male with left flank pain  Plan: See Problem List and After Visit Summary

## 2012-09-03 NOTE — Patient Instructions (Addendum)
It was good to see you today.  I am sorry your pain has not improved. Try the pain medication to see if it can calm down the pain. Please consider the lidocaine patch, I think it could be very helpful. If you do not feel better, please let me know. If you develop a rash, let us know as soon as possible.  Ralonda Tartt M. Elmus Mathes, M.D.

## 2012-09-03 NOTE — Assessment & Plan Note (Signed)
Precepted with Dr. Andria Frames. Unsure of etiology. Ddx includes thoracic nerve pain, zoster or kidney stone. UA collected in clinic is unremarkable. Will treat patient's pain with short term narcotic pain medications. Offered Lidoderm patches, but patient declined at this time. Also, if he does not improve, consider getting plain T-spine films to look for compression. Patient to return in 2 weeks, or sooner if anything changes.

## 2012-09-17 ENCOUNTER — Other Ambulatory Visit: Payer: Self-pay | Admitting: *Deleted

## 2012-09-17 MED ORDER — GABAPENTIN 300 MG PO CAPS: 600.0000 mg | ORAL_CAPSULE | Freq: Three times a day (TID) | ORAL | Status: DC

## 2012-11-20 ENCOUNTER — Encounter: Payer: Self-pay | Admitting: Home Health Services

## 2012-11-27 ENCOUNTER — Other Ambulatory Visit: Payer: Self-pay | Admitting: Family Medicine

## 2012-11-28 ENCOUNTER — Other Ambulatory Visit: Payer: Self-pay | Admitting: Family Medicine

## 2012-11-28 MED ORDER — FLUTICASONE-SALMETEROL 500-50 MCG/DOSE IN AEPB: 1.0000 | INHALATION_SPRAY | Freq: Two times a day (BID) | RESPIRATORY_TRACT | Status: DC

## 2012-11-29 NOTE — Telephone Encounter (Signed)
Please have make appt.

## 2012-12-24 ENCOUNTER — Encounter: Payer: Self-pay | Admitting: Home Health Services

## 2012-12-24 ENCOUNTER — Ambulatory Visit (INDEPENDENT_AMBULATORY_CARE_PROVIDER_SITE_OTHER): Payer: Medicare Other | Admitting: Home Health Services

## 2012-12-24 VITALS — BP 127/77 | HR 124 | Temp 97.0°F | Ht 71.0 in | Wt 195.1 lb

## 2012-12-24 DIAGNOSIS — Z23 Encounter for immunization: Secondary | ICD-10-CM

## 2012-12-24 DIAGNOSIS — Z0000 Encounter for general adult medical examination without abnormal findings: Secondary | ICD-10-CM

## 2012-12-24 NOTE — Progress Notes (Signed)
Patient here for annual wellness visit, patient reports: Risk Factors/Conditions needing evaluation or treatment: Pt does not have any new risk factors that need evaluation. Home Safety: Pt lives with wife, step daughter and granddaughter in 2 story home.  Pt reports having smoke detector and does not have adaptive equipment.  Other Information: Corrective lens: Pt wears daily corrective lens. Pt reports some cloudiness, will schedule appt with eye doctor. Dentures: Pt has full dentures. Memory: Pt reports some memory problems.  Patient's Mini Mental Score (recorded in doc. flowsheet): 29 Pt reports some hearing problems, pt now has hearing aids in both ears.  Balance/Gait: Pt reports 1 fall in past year with no injuries.     Annual Wellness Visit Requirements Recorded Today In  Medical, family, social history Past Medical, Family, Social History Section  Current providers Care team  Current medications Medications  Wt, BP, Ht, BMI Vital signs  Tobacco, alcohol, illicit drug use History  ADL Nurse Assessment  Depression Screening Nurse Assessment  Cognitive impairment Nurse Assessment  Mini Mental Status Document Flowsheet  Fall Risk Fall/Depression  Home Safety Progress Note  End of Life Planning (welcome visit) Social Documentation  Medicare preventative services Progress Note  Risk factors/conditions needing evaluation/treatment Progress Note  Personalized health advice Patient Instructions, goals, letter  Diet & Exercise Social Documentation  Emergency Contact Social Documentation  Seat Belts Social Documentation  Sun exposure/protection Social Documentation    I have reviewed this visit and discussed with Lamont Dowdy and agree with her documentation Raoul Pitch, Renee DO

## 2013-01-15 ENCOUNTER — Other Ambulatory Visit: Payer: Self-pay | Admitting: Family Medicine

## 2013-02-06 ENCOUNTER — Encounter: Payer: Self-pay | Admitting: Home Health Services

## 2013-03-06 ENCOUNTER — Other Ambulatory Visit: Payer: Self-pay | Admitting: Family Medicine

## 2013-03-23 ENCOUNTER — Other Ambulatory Visit: Payer: Self-pay | Admitting: Family Medicine

## 2013-04-03 ENCOUNTER — Ambulatory Visit (INDEPENDENT_AMBULATORY_CARE_PROVIDER_SITE_OTHER): Payer: Medicare Other | Admitting: Family Medicine

## 2013-04-03 ENCOUNTER — Encounter: Payer: Self-pay | Admitting: Family Medicine

## 2013-04-03 VITALS — BP 162/98 | HR 84 | Temp 97.9°F | Ht 71.0 in | Wt 195.0 lb

## 2013-04-03 DIAGNOSIS — G548 Other nerve root and plexus disorders: Secondary | ICD-10-CM

## 2013-04-03 DIAGNOSIS — I10 Essential (primary) hypertension: Secondary | ICD-10-CM

## 2013-04-03 MED ORDER — OXYCODONE-ACETAMINOPHEN 5-325 MG PO TABS: 1.0000 | ORAL_TABLET | Freq: Three times a day (TID) | ORAL | Status: DC | PRN

## 2013-04-03 NOTE — Assessment & Plan Note (Signed)
Patient reports compliance with gabapentin.  Is not taking Cymbalta. Pain contract signed today. Refilled his Roxicet 5/325 or 30 pills. It appears she is responsible and states be used for when he needs something for strong pain.

## 2013-04-03 NOTE — Patient Instructions (Signed)
Please take your blood pressure one to 2 times a week for the next few weeks. Our goal blood pressure is below 140/80 for you. If your blood pressures are consistently above goal we will need to alter your medications.  I have placed future labs to be completed in March/April. You will need to make an appointment for your yearly physical during this time as well.  Hypertension As your heart beats, it forces blood through your arteries. This force is your blood pressure. If the pressure is too high, it is called hypertension (HTN) or high blood pressure. HTN is dangerous because you may have it and not know it. High blood pressure may mean that your heart has to work harder to pump blood. Your arteries may be narrow or stiff. The extra work puts you at risk for heart disease, stroke, and other problems.  Blood pressure consists of two numbers, a higher number over a lower, 110/72, for example. It is stated as "110 over 72." The ideal is below 120 for the top number (systolic) and under 80 for the bottom (diastolic). Write down your blood pressure today. You should pay close attention to your blood pressure if you have certain conditions such as:  Heart failure.  Prior heart attack.  Diabetes  Chronic kidney disease.  Prior stroke.  Multiple risk factors for heart disease. To see if you have HTN, your blood pressure should be measured while you are seated with your arm held at the level of the heart. It should be measured at least twice. A one-time elevated blood pressure reading (especially in the Emergency Department) does not mean that you need treatment. There may be conditions in which the blood pressure is different between your right and left arms. It is important to see your caregiver soon for a recheck. Most people have essential hypertension which means that there is not a specific cause. This type of high blood pressure may be lowered by changing lifestyle factors such  as:  Stress.  Smoking.  Lack of exercise.  Excessive weight.  Drug/tobacco/alcohol use.  Eating less salt. Most people do not have symptoms from high blood pressure until it has caused damage to the body. Effective treatment can often prevent, delay or reduce that damage. TREATMENT  When a cause has been identified, treatment for high blood pressure is directed at the cause. There are a large number of medications to treat HTN. These fall into several categories, and your caregiver will help you select the medicines that are best for you. Medications may have side effects. You should review side effects with your caregiver. If your blood pressure stays high after you have made lifestyle changes or started on medicines,   Your medication(s) may need to be changed.  Other problems may need to be addressed.  Be certain you understand your prescriptions, and know how and when to take your medicine.  Be sure to follow up with your caregiver within the time frame advised (usually within two weeks) to have your blood pressure rechecked and to review your medications.  If you are taking more than one medicine to lower your blood pressure, make sure you know how and at what times they should be taken. Taking two medicines at the same time can result in blood pressure that is too low. SEEK IMMEDIATE MEDICAL CARE IF:  You develop a severe headache, blurred or changing vision, or confusion.  You have unusual weakness or numbness, or a faint feeling.  You have  severe chest or abdominal pain, vomiting, or breathing problems. MAKE SURE YOU:   Understand these instructions.  Will watch your condition.  Will get help right away if you are not doing well or get worse. Document Released: 02/21/2005 Document Revised: 05/16/2011 Document Reviewed: 10/12/2007 [facility] Patient Information 2014 [place].

## 2013-04-03 NOTE — Progress Notes (Signed)
   Subjective:    Patient ID: [name], male    DOB: 09-Jul-1933, 80 y.o.   MRN: [id number]  HPI   Pain: Patient states that his left leg pain from his herniated nucleus pulposus at L4-L5, with sacral nerve root compression, is causing him pain again. He has had prescriptions for tramadol in the past that he this is not effective at all. He has also had prescriptions for Roxicet 5/325 in the past and feels that works better. It appears his last prescription refill was in March 2014 for 90 pills. He denies receiving narcotic prescriptions from other providers. He reports his pain to be in the same nerve distribution as prior radiating from his back through his left thigh and down to his foot, on the left.   HTN: Patient reports compliance which is losartan 25 mg. His blood pressure is elevated today. He states that he has been going through a little bit of stress over the weekend with his family member. And he feels that the elevation is likely due to the current stress.  Review of Systems Negative, with the exception of above mentioned in HPI     Objective:   Physical Exam BP 162/98  Pulse 84  Temp(Src) 97.9 F (36.6 C) (Oral)  Ht 5\' 11"  (1.803 m)  Wt 195 lb (88.451 kg)  BMI 27.21 kg/m2 Gen: NAD.  CV: RRR  Chest: CTAB, no wheeze or crackles

## 2013-04-03 NOTE — Assessment & Plan Note (Signed)
Patient not at goal today. He believes it is from a stressful situation that occurred over the weekend. Patient advised to take blood pressure one to 2 times a week for the next 2-3 weeks. Patient educated on proper blood pressure being below 130/80. He is to make a followup appointment if his blood pressure is not below 130/80 when he checks it.  She is to make a yearly physical appointment to occur in March or April.

## 2013-04-09 ENCOUNTER — Other Ambulatory Visit: Payer: Self-pay | Admitting: Family Medicine

## 2013-06-01 ENCOUNTER — Other Ambulatory Visit: Payer: Self-pay | Admitting: Family Medicine

## 2013-07-09 ENCOUNTER — Encounter: Payer: Self-pay | Admitting: Family Medicine

## 2013-07-09 ENCOUNTER — Ambulatory Visit (INDEPENDENT_AMBULATORY_CARE_PROVIDER_SITE_OTHER): Payer: Medicare Other | Admitting: Family Medicine

## 2013-07-09 VITALS — BP 143/74 | HR 82 | Temp 98.3°F | Ht 71.0 in | Wt 195.0 lb

## 2013-07-09 DIAGNOSIS — J45909 Unspecified asthma, uncomplicated: Secondary | ICD-10-CM

## 2013-07-09 DIAGNOSIS — Z0000 Encounter for general adult medical examination without abnormal findings: Secondary | ICD-10-CM | POA: Insufficient documentation

## 2013-07-09 DIAGNOSIS — M109 Gout, unspecified: Secondary | ICD-10-CM

## 2013-07-09 DIAGNOSIS — M5126 Other intervertebral disc displacement, lumbar region: Secondary | ICD-10-CM

## 2013-07-09 DIAGNOSIS — I10 Essential (primary) hypertension: Secondary | ICD-10-CM

## 2013-07-09 DIAGNOSIS — G548 Other nerve root and plexus disorders: Secondary | ICD-10-CM

## 2013-07-09 LAB — COMPLETE METABOLIC PANEL WITH GFR
ALT: 19 U/L (ref 0–53)
AST: 17 U/L (ref 0–37)
Albumin: 4.1 g/dL (ref 3.5–5.2)
Alkaline Phosphatase: 81 U/L (ref 39–117)
BUN: 15 mg/dL (ref 6–23)
CO2: 21 mEq/L (ref 19–32)
Calcium: 9.1 mg/dL (ref 8.4–10.5)
Chloride: 105 mEq/L (ref 96–112)
Creat: 1.22 mg/dL (ref 0.50–1.35)
GFR, Est African American: 66 mL/min
GFR, Est Non African American: 57 mL/min — ABNORMAL LOW
Glucose, Bld: 156 mg/dL — ABNORMAL HIGH (ref 70–99)
Potassium: 4.4 mEq/L (ref 3.5–5.3)
Sodium: 136 mEq/L (ref 135–145)
Total Bilirubin: 0.4 mg/dL (ref 0.2–1.2)
Total Protein: 6.8 g/dL (ref 6.0–8.3)

## 2013-07-09 LAB — CBC WITH DIFFERENTIAL/PLATELET
Basophils Absolute: 0 10*3/uL (ref 0.0–0.1)
Basophils Relative: 0 % (ref 0–1)
Eosinophils Absolute: 0.6 10*3/uL (ref 0.0–0.7)
Eosinophils Relative: 5 % (ref 0–5)
HCT: 42.3 % (ref 39.0–52.0)
Hemoglobin: 14.6 g/dL (ref 13.0–17.0)
Lymphocytes Relative: 35 % (ref 12–46)
Lymphs Abs: 4.1 10*3/uL — ABNORMAL HIGH (ref 0.7–4.0)
MCH: 26.3 pg (ref 26.0–34.0)
MCHC: 34.5 g/dL (ref 30.0–36.0)
MCV: 76.1 fL — ABNORMAL LOW (ref 78.0–100.0)
Monocytes Absolute: 0.8 10*3/uL (ref 0.1–1.0)
Monocytes Relative: 7 % (ref 3–12)
Neutro Abs: 6.1 10*3/uL (ref 1.7–7.7)
Neutrophils Relative %: 53 % (ref 43–77)
Platelets: 181 10*3/uL (ref 150–400)
RBC: 5.56 MIL/uL (ref 4.22–5.81)
RDW: 15.7 % — ABNORMAL HIGH (ref 11.5–15.5)
WBC: 11.6 10*3/uL — ABNORMAL HIGH (ref 4.0–10.5)

## 2013-07-09 LAB — TSH: TSH: 3.523 u[IU]/mL (ref 0.350–4.500)

## 2013-07-09 LAB — LDL CHOLESTEROL, DIRECT: Direct LDL: 65 mg/dL

## 2013-07-09 MED ORDER — TIOTROPIUM BROMIDE MONOHYDRATE 18 MCG IN CAPS: 18.0000 ug | ORAL_CAPSULE | Freq: Every day | RESPIRATORY_TRACT | Status: DC

## 2013-07-09 MED ORDER — FLUTICASONE-SALMETEROL 500-50 MCG/DOSE IN AEPB: 1.0000 | INHALATION_SPRAY | Freq: Two times a day (BID) | RESPIRATORY_TRACT | Status: DC

## 2013-07-09 MED ORDER — ALBUTEROL SULFATE (2.5 MG/3ML) 0.083% IN NEBU: 2.5000 mg | INHALATION_SOLUTION | Freq: Four times a day (QID) | RESPIRATORY_TRACT | Status: DC | PRN

## 2013-07-09 MED ORDER — OXYCODONE-ACETAMINOPHEN 5-325 MG PO TABS: 1.0000 | ORAL_TABLET | Freq: Three times a day (TID) | ORAL | Status: DC | PRN

## 2013-07-09 NOTE — Progress Notes (Signed)
   Subjective:    Patient ID: [name], male    DOB: [birth year], 80 y.o.   MRN: [id number]  HPI [name] is 80 y.o. african [place] male, presented to [facility] today for yearly physical. His only complaints today are his chronic back, leg and left knee pain.   Pain: Patient states that his back, left leg pain from his herniated nucleus pulposus at L4-L5, with sacral nerve root compression, is causing him pain again. He is asking for pain refills of percocet. His last prescription was for 30# and written in January 2015.  He denies receiving narcotic prescriptions from other providers. He reports his pain to be in the same nerve distribution as prior radiating from his back through his left thigh and down to his foot, on the left. He has pain with walking and sitting long periods of time. He has been to pain management in the past and received epidural injections that improved his pain for some years.    HTN: Patient reports compliance which is losartan 25 mg. His blood pressure is normal today. He has no complaints of chest pain, shortness of breath, palpitations or swelling of the extremities.   Asthma/COPD: Patient has a diagnosis of persistent asthma. He currently takes Advair 500 mg one puff 2 times a day and Singulair. States he has been out of this medication for some time. He is also help of an albuterol inhaler as well. He can tell a difference in his breathing since she's been out of his inhaler.   Gout: Patient reports that he is still taking his allopurinol daily as prescribed. He has had no gout flares that he can recall. He does complain of left knee pain today, but that seems to be a chronic problem that he states goes back for years. He denies any erythema, pain or swelling of joints.   Review of Systems Negative, with the exception of above mentioned in HPI  Objective:   Physical Exam BP 143/74  Pulse 82  Temp(Src) 98.3 F (36.8 C) (Oral)  Ht 5\' 11"  (1.803 m)   Wt 195 lb (88.451 kg)  BMI 27.21 kg/m2 Gen: Well-developed, well-nourished [place] American male. No acute distress. HEENT: AT. [place]. Bilateral hearing aids in place. Bilateral eyes without injections or icterus. MMM. Upper and lower dentures. Bilateral nares without erythema or swelling Throat without erythema or exudates.  CV: RRR, no murmurs, clicks, gallops or rubs. Chest: CTAB, no wheeze or crackles. Diminished breath sounds bilaterally. Abd: Soft. Mild distention, mild tenderness left lower quadrant. BS present. No Masses palpated.  Ext: No erythema. No edema. No swelling of joints bilateral knees.  Skin: No rashes, purpura or petechiae.  Neuro:  Normal gait. PERLA. EOMi. Alert. Grossly intact.  Psych: Flat affect.

## 2013-07-09 NOTE — Assessment & Plan Note (Addendum)
Detailed discussion today over his pain management. I have refilled his Roxicet #30 pills no refills. Patient was provided with options including sports medicine referral, physical therapy, pain management referral with possible epidural injections and surgery referral. Patient has declined all of the above options and currently he only wants to stick with the pain pill regimen. Explained to patient that if he does require another refill on his pain medications I will place a pain management referral for him as we do not prescribe chronic narcotics. He also takes gabapentin 1800 mg daily. Continue with current regimen. Patient is aware to call if he changes his mind about any of the above options provided to him today, to help control his pain.

## 2013-07-09 NOTE — Assessment & Plan Note (Addendum)
Refill on albuterol inhaler today. Refill on Singulair and Advair.

## 2013-07-09 NOTE — Patient Instructions (Signed)

## 2013-07-09 NOTE — Assessment & Plan Note (Signed)
Patient's blood pressure is stable today.  Continue current regimen

## 2013-07-09 NOTE — Assessment & Plan Note (Addendum)
Patient made aware of the probable need for colonoscopy. He states his last colonoscopy was in 2008 and at that time had some polyps and was required to followup likely prior to 5 years.  Patient was given information on colon cancer and scheduling his screening colonoscopy. Patient understands recommendation. Declines shingles vaccination Flu due October 2015

## 2013-07-09 NOTE — Assessment & Plan Note (Signed)
Controlled on allopurinol. Continue take medications as directed daily.

## 2013-07-15 ENCOUNTER — Telehealth: Payer: Self-pay | Admitting: Family Medicine

## 2013-07-15 ENCOUNTER — Encounter: Payer: Self-pay | Admitting: Family Medicine

## 2013-07-15 NOTE — Telephone Encounter (Signed)
Relayed message,patient voiced understanding.[place]

## 2013-07-15 NOTE — Telephone Encounter (Signed)
Please advise.Thank you.Mally Gavina S Dennisse Swader  

## 2013-07-15 NOTE — Telephone Encounter (Signed)
Pt called and would like his lab results. jw

## 2013-07-15 NOTE — Telephone Encounter (Signed)
His lab results were mailed to him and he should be receiving them within a couple days. You can call him and tell him they are basically unchanged from prior years. Thanks.

## 2013-08-04 ENCOUNTER — Other Ambulatory Visit: Payer: Self-pay | Admitting: Family Medicine

## 2013-08-14 ENCOUNTER — Telehealth: Payer: Self-pay | Admitting: Family Medicine

## 2013-08-28 NOTE — Telephone Encounter (Signed)
Left Message - Called to schedule IAWV. When pt returns call please schedule appt. for 60 minutes.

## 2013-09-07 ENCOUNTER — Other Ambulatory Visit: Payer: Self-pay | Admitting: Family Medicine

## 2013-10-03 ENCOUNTER — Encounter: Payer: Self-pay | Admitting: Family Medicine

## 2013-10-03 ENCOUNTER — Ambulatory Visit (INDEPENDENT_AMBULATORY_CARE_PROVIDER_SITE_OTHER): Payer: Medicare Other | Admitting: Family Medicine

## 2013-10-03 VITALS — BP 169/81 | HR 93 | Temp 97.9°F | Ht 71.0 in | Wt 194.8 lb

## 2013-10-03 DIAGNOSIS — G548 Other nerve root and plexus disorders: Secondary | ICD-10-CM

## 2013-10-03 MED ORDER — OXYCODONE-ACETAMINOPHEN 5-325 MG PO TABS: 1.0000 | ORAL_TABLET | Freq: Three times a day (TID) | ORAL | Status: DC | PRN

## 2013-10-03 NOTE — Progress Notes (Signed)
Patient ID: [name], male   DOB: [birth year], 80 y.o.   MRN: [id number]   Subjective:   [name] is a 80 y.o. male with a history of herniated nucleus pulposus L4-5 with L-sided radiculopathy here for back pain.   [name] is experiencing recurrent lower back pain that is severe, "shooting," worsening for the past few months, worse in the day than the night and radiated down his left leg in a sciatic distribution. A herniated disc was diagnosed by MRI in 2012 and he reports receiving an injection from Dr. Brien Few in 2013 to which he attributes being essentially pain free up until about 3-4 months ago. The pain now is somewhat responsive to tylenol and oxycodone, worsened by sitting, and limits his ability to ride in a car or walk for long periods (e.g. the grocery store).   No saddle anesthesia or incontinence.    Colonoscopy: Due again in 2020 Shingles: Declines vaccination  Review of Systems:  Per HPI. All other systems reviewed and are negative.  Medications: reviewed and updated Objective:  BP 169/81  Pulse 93  Temp(Src) 97.9 F (36.6 C) (Oral)  Ht 5\' 11"  (1.803 m)  Wt 194 lb 12.8 oz (88.361 kg)  BMI 27.18 kg/m2  Gen: Healthy elderly male in NAD MSK: No edema noted, Able to touch toes by bending over, + straight leg raise test, palpable protrusion in the midline lumbar back which is tender to palpation, no paraspinal spasm.  Neuro: Alert and oriented, speech normal, mildly antalgic gait without assistive device.       Chemistry      Component Value Date/Time   NA 136 07/09/2013 0922   K 4.4 07/09/2013 0922   CL 105 07/09/2013 0922   CO2 21 07/09/2013 0922   BUN 15 07/09/2013 0922   CREATININE 1.22 07/09/2013 0922   CREATININE 1.05 07/01/2009 2223      Component Value Date/Time   CALCIUM 9.1 07/09/2013 0922   CALCIUM 8.3* 11/21/2006 1346   ALKPHOS 81 07/09/2013 0922   AST 17 07/09/2013 0922   ALT 19 07/09/2013 0922   BILITOT 0.4 07/09/2013 0922      Lab Results    Component Value Date   WBC 11.6* 07/09/2013   HGB 14.6 07/09/2013   HCT 42.3 07/09/2013   MCV 76.1* 07/09/2013   PLT 181 07/09/2013   Lab Results  Component Value Date   TSH 3.523 07/09/2013   Lab Results  Component Value Date   HGBA1C  Value: 5.7 (NOTE)   The ADA recommends the following therapeutic goals for glycemic   control related to Hgb A1C measurement:   Goal of Therapy:   < 7.0% Hgb A1C   Action Suggested:  > 8.0% Hgb A1C   Ref:  Diabetes Care, 22, Suppl. 1, 1999 11/24/2006   Assessment:     [name] is a 80 y.o. male here for recurrent sciatic back pain.    Plan:     See problem list for problem-specific plans.

## 2013-10-03 NOTE — Assessment & Plan Note (Signed)
Recurrent pain due to this today with excellent results by injection in 2013 by Dr. Brien Few. Will re-refer there and Rx oxycodone 5/325mg  #30. No suspicious history suggestive of addiction or diversion.

## 2013-10-06 ENCOUNTER — Other Ambulatory Visit: Payer: Self-pay | Admitting: Family Medicine

## 2013-12-16 ENCOUNTER — Ambulatory Visit (INDEPENDENT_AMBULATORY_CARE_PROVIDER_SITE_OTHER): Payer: Medicare Other | Admitting: *Deleted

## 2013-12-16 DIAGNOSIS — Z23 Encounter for immunization: Secondary | ICD-10-CM

## 2014-01-02 ENCOUNTER — Other Ambulatory Visit: Payer: Self-pay | Admitting: Family Medicine

## 2014-01-29 ENCOUNTER — Other Ambulatory Visit: Payer: Self-pay | Admitting: Family Medicine

## 2014-02-04 ENCOUNTER — Ambulatory Visit (INDEPENDENT_AMBULATORY_CARE_PROVIDER_SITE_OTHER): Payer: Medicare Other | Admitting: Family Medicine

## 2014-02-04 ENCOUNTER — Encounter: Payer: Self-pay | Admitting: Family Medicine

## 2014-02-04 VITALS — BP 152/94 | HR 74 | Temp 97.6°F | Wt 191.0 lb

## 2014-02-04 DIAGNOSIS — R509 Fever, unspecified: Secondary | ICD-10-CM

## 2014-02-04 LAB — POCT URINALYSIS DIPSTICK
Bilirubin, UA: NEGATIVE
Blood, UA: NEGATIVE
Glucose, UA: NEGATIVE
Ketones, UA: NEGATIVE
Leukocytes, UA: NEGATIVE
Nitrite, UA: NEGATIVE
Protein, UA: NEGATIVE
Spec Grav, UA: 1.01
Urobilinogen, UA: 0.2
pH, UA: 6

## 2014-02-04 MED ORDER — BENZONATATE 100 MG PO CAPS: 100.0000 mg | ORAL_CAPSULE | Freq: Two times a day (BID) | ORAL | Status: DC | PRN

## 2014-02-04 MED ORDER — ALBUTEROL SULFATE HFA 108 (90 BASE) MCG/ACT IN AERS
2.0000 | INHALATION_SPRAY | Freq: Four times a day (QID) | RESPIRATORY_TRACT | Status: DC | PRN
Start: 2014-02-04 — End: 2017-02-16

## 2014-02-04 MED ORDER — ACETAMINOPHEN 500 MG PO TABS: 500.0000 mg | ORAL_TABLET | Freq: Four times a day (QID) | ORAL | Status: DC | PRN

## 2014-02-04 NOTE — Progress Notes (Signed)
Patient ID: [name], male   DOB: 1933/04/26, 80 y.o.   MRN: [id number]   [name] [name], MD Phone: [phone]  Subjective:  [name] is a 80 y.o M who presents for headache, cold and chills for the past two days. A few weeks ago had some "fever blisters". Does attest to some frequency with urination in addition to some cough. Did not take tylenol but maybe tried some alka seltzer for coughing. Pt attests to dry cough for 2-3 months. Pt using spiriva and advair every day. Has not used albuterol.  Did receive flu vaccine this year.   All relevant systems were reviewed and were negative unless otherwise noted in the HPI  Past Medical History Reviewed problem list.  Medications- reviewed and updated Current Outpatient Prescriptions  Medication Sig Dispense Refill  . ADVAIR DISKUS 500-50 MCG/DOSE AEPB INHALE 1 PUFF INTO THE LUNGS 2 (TWO) TIMES DAILY. 60 each 3  . albuterol (PROVENTIL) (2.5 MG/3ML) 0.083% nebulizer solution Take 3 mLs (2.5 mg total) by nebulization every 6 (six) hours as needed for wheezing or shortness of breath. 75 mL 2  . allopurinol (ZYLOPRIM) 300 MG tablet TAKE 1 TABLET EVERY DAY 30 tablet 5  . gabapentin (NEURONTIN) 300 MG capsule TAKE 2 CAPSULES (600 MG TOTAL) BY MOUTH 3 (THREE) TIMES DAILY. 180 capsule 3  . losartan (COZAAR) 25 MG tablet TAKE 1 TABLET (25 MG TOTAL) BY MOUTH DAILY. 30 tablet 5  . oxyCODONE-acetaminophen (ROXICET) 5-325 MG per tablet Take 1 tablet by mouth every 8 (eight) hours as needed. 30 tablet 0  . tiotropium (SPIRIVA HANDIHALER) 18 MCG inhalation capsule Place 1 capsule (18 mcg total) into inhaler and inhale daily. 30 capsule 6   No current facility-administered medications for this visit.   Chief complaint-noted No additions to family history Social history- patient is a former smoker  Objective: BP 152/94 mmHg  Pulse 74  Temp(Src) 97.6 F (36.4 C) (Oral)  Wt 191 lb (86.637 kg) Gen: NAD, alert,  cooperative with exam HEENT: NCAT, EOMI, PERRL, wears dentures Neck: FROM, supple CV: RRR, good S1/S2, no murmur, cap refill <3 Resp: CTABL, no wheezes, non-labored Abd: SNTND, BS present, no guarding or organomegaly Ext: No edema, warm, normal tone, moves UE/LE spontaneously Neuro: Alert and oriented, No gross deficits Skin: no rashes no lesions  Assessment/Plan: 1. Fever, unspecified fever cause - POCT urinalysis dipstick- neg -suspect likely viral URI - did receive flu vax this year - supportive care- tessalon and prn albuterol -rtc as needed  [name], MD Family Medicine PGY-2

## 2014-02-04 NOTE — Patient Instructions (Addendum)
[name] it was great to meet you today!  I am sorry that you are not feeling well  You can use tylenol for pain and tessalon for cough  Please return to clinic if symptoms do not improve or worsen Feel better soon Happy holidays! [name], MD

## 2014-02-09 ENCOUNTER — Other Ambulatory Visit: Payer: Self-pay | Admitting: Family Medicine

## 2014-02-13 ENCOUNTER — Other Ambulatory Visit: Payer: Self-pay | Admitting: Family Medicine

## 2014-02-13 DIAGNOSIS — Z5181 Encounter for therapeutic drug level monitoring: Secondary | ICD-10-CM

## 2014-02-13 NOTE — Telephone Encounter (Signed)
Please have Mr. Motts come in just for some routine labs to monitor the effects of this medication. I have refilled another 6 months worth, but he should have the labwork done soon.   Thankyou!  - Milliani Herrada B. Bonner Puna, MD, PGY-2 02/13/2014 11:47 PM

## 2014-02-17 ENCOUNTER — Encounter: Payer: Self-pay | Admitting: Family Medicine

## 2014-02-17 ENCOUNTER — Ambulatory Visit
Admission: RE | Admit: 2014-02-17 | Discharge: 2014-02-17 | Disposition: A | Discharge status: Home / Self Care | Payer: Medicare Other | Source: Ambulatory Visit | Attending: Family Medicine | Admitting: Family Medicine

## 2014-02-17 ENCOUNTER — Ambulatory Visit (INDEPENDENT_AMBULATORY_CARE_PROVIDER_SITE_OTHER): Payer: Medicare Other | Admitting: Family Medicine

## 2014-02-17 VITALS — BP 150/83 | HR 87 | Temp 98.0°F | Wt 187.0 lb

## 2014-02-17 DIAGNOSIS — R531 Weakness: Secondary | ICD-10-CM

## 2014-02-17 DIAGNOSIS — Z862 Personal history of diseases of the blood and blood-forming organs and certain disorders involving the immune mechanism: Secondary | ICD-10-CM

## 2014-02-17 DIAGNOSIS — R251 Tremor, unspecified: Secondary | ICD-10-CM

## 2014-02-17 DIAGNOSIS — R5383 Other fatigue: Secondary | ICD-10-CM | POA: Insufficient documentation

## 2014-02-17 DIAGNOSIS — I10 Essential (primary) hypertension: Secondary | ICD-10-CM

## 2014-02-17 LAB — COMPREHENSIVE METABOLIC PANEL
ALT: 304 U/L — ABNORMAL HIGH (ref 0–53)
AST: 76 U/L — ABNORMAL HIGH (ref 0–37)
Albumin: 4.1 g/dL (ref 3.5–5.2)
Alkaline Phosphatase: 245 U/L — ABNORMAL HIGH (ref 39–117)
BUN: 11 mg/dL (ref 6–23)
CO2: 22 mEq/L (ref 19–32)
Calcium: 9 mg/dL (ref 8.4–10.5)
Chloride: 100 mEq/L (ref 96–112)
Creat: 1.07 mg/dL (ref 0.50–1.35)
Glucose, Bld: 224 mg/dL — ABNORMAL HIGH (ref 70–99)
Potassium: 4.1 mEq/L (ref 3.5–5.3)
Sodium: 135 mEq/L (ref 135–145)
Total Bilirubin: 1.2 mg/dL (ref 0.2–1.2)
Total Protein: 7 g/dL (ref 6.0–8.3)

## 2014-02-17 LAB — CBC
HCT: 39 % (ref 39.0–52.0)
Hemoglobin: 13.3 g/dL (ref 13.0–17.0)
MCH: 26.5 pg (ref 26.0–34.0)
MCHC: 34.1 g/dL (ref 30.0–36.0)
MCV: 77.8 fL — ABNORMAL LOW (ref 78.0–100.0)
MPV: 10.3 fL (ref 9.4–12.4)
Platelets: 182 10*3/uL (ref 150–400)
RBC: 5.01 MIL/uL (ref 4.22–5.81)
RDW: 15.5 % (ref 11.5–15.5)
WBC: 11.1 10*3/uL — ABNORMAL HIGH (ref 4.0–10.5)

## 2014-02-17 MED ORDER — LOSARTAN POTASSIUM 50 MG PO TABS: 50.0000 mg | ORAL_TABLET | Freq: Every day | ORAL | Status: DC

## 2014-02-17 NOTE — Assessment & Plan Note (Addendum)
Trembling episodes x2, no fever per patient. Some vague weakness/fatigue. Exam unremarkable. H/o anemia and COPD - will repeat CBC and basic anemia work-up - cmp - CXR given chronic cough, possibly worsened recently - will call with test results

## 2014-02-17 NOTE — Patient Instructions (Signed)
To look for causes of your blood work I think we should get some blood work as well as a chest xray to look for a pneumonia.  I am increasing your dose of losartan to try to get better control of your blood pressure, which could also make you feel better.  We will call with the results of todays tests by the end of the week.

## 2014-02-17 NOTE — Progress Notes (Signed)
   Subjective:    Patient ID: [name], male    DOB: [birth year], 80 y.o.   MRN: [id number]  HPI Patient presents for trembling episodes which have happened twice over the past 2 weeks. He reports he gets very cold and shakes all over for about 2 hours each time. He has also had some worsening of his chronic cough (h/o copd) but not productive and denies fevers. He also reports he has felt more tired and weak over the past few weeks.    Review of Systems See HPI    Objective:   Physical Exam  Constitutional: He is oriented to person, place, and time. He appears well-developed and well-nourished. No distress.  HENT:  Head: Normocephalic and atraumatic.  Eyes: Conjunctivae are normal. Right eye exhibits no discharge. Left eye exhibits no discharge. No scleral icterus.  Cardiovascular: Normal rate, regular rhythm, normal heart sounds and intact distal pulses.   No murmur heard. 2+ dp and pt pulses bilaterally  Pulmonary/Chest: Effort normal and breath sounds normal. No respiratory distress. He has no wheezes.  Prolonged expiratory phase  Abdominal: Soft. He exhibits no distension.  Musculoskeletal: He exhibits no edema.  Neurological: He is alert and oriented to person, place, and time.  Skin: Skin is warm and dry. No rash noted. He is not diaphoretic. No pallor.  Psychiatric: He has a normal mood and affect. His behavior is normal.  Nursing note and vitals reviewed.         Assessment & Plan:

## 2014-02-17 NOTE — Assessment & Plan Note (Addendum)
150-160s SBP on last 3 visits, patient thinks trembling is from high BP - increase losartan dose to 50mg  daily - f/u in 3 months

## 2014-02-18 ENCOUNTER — Telehealth: Payer: Self-pay | Admitting: *Deleted

## 2014-02-18 LAB — HIV ANTIBODY (ROUTINE TESTING W REFLEX): HIV 1&2 Ab, 4th Generation: NONREACTIVE

## 2014-02-18 LAB — FOLATE: Folate: 4.8 ng/mL

## 2014-02-18 LAB — FERRITIN: Ferritin: 964 ng/mL — ABNORMAL HIGH (ref 22–322)

## 2014-02-18 LAB — VITAMIN B12: Vitamin B-12: 446 pg/mL (ref 211–911)

## 2014-02-18 NOTE — Telephone Encounter (Signed)
-----   Message from Frazier Richards, MD sent at 02/17/2014  2:11 PM EST ----- Please let Mr. Brull know that his xray was normal and did not show a pneumonia and we will be in contact when his labwork results.

## 2014-02-18 NOTE — Telephone Encounter (Signed)
Pt and wife informed. Deseree Kennon Holter, CMA

## 2014-02-20 ENCOUNTER — Telehealth: Payer: Self-pay | Admitting: *Deleted

## 2014-02-20 NOTE — Telephone Encounter (Signed)
-----   Message from Frazier Richards, MD sent at 02/20/2014 10:08 AM EST ----- Please tell [name] that he is slightly anemic, his iron level is normal and his liver enzymes are elevated. Please ask him to make another appointment to further investigate his liver function. Thanks!

## 2014-02-20 NOTE — Telephone Encounter (Signed)
Pt informed. Made an appt for him to see dr Bonner Puna to follow up on Tuesday December 22 @ 10. [name] CMA

## 2014-02-25 ENCOUNTER — Ambulatory Visit (INDEPENDENT_AMBULATORY_CARE_PROVIDER_SITE_OTHER): Payer: Medicare Other | Admitting: Family Medicine

## 2014-02-25 ENCOUNTER — Encounter: Payer: Self-pay | Admitting: Family Medicine

## 2014-02-25 VITALS — BP 146/98 | HR 99 | Temp 97.6°F | Ht 71.0 in | Wt 188.0 lb

## 2014-02-25 DIAGNOSIS — R740 Nonspecific elevation of levels of transaminase and lactic acid dehydrogenase [LDH]: Secondary | ICD-10-CM

## 2014-02-25 DIAGNOSIS — R748 Abnormal levels of other serum enzymes: Secondary | ICD-10-CM

## 2014-02-25 DIAGNOSIS — R739 Hyperglycemia, unspecified: Secondary | ICD-10-CM

## 2014-02-25 LAB — POCT GLYCOSYLATED HEMOGLOBIN (HGB A1C): Hemoglobin A1C: 6.7

## 2014-02-25 LAB — COMPREHENSIVE METABOLIC PANEL
ALT: 63 U/L — ABNORMAL HIGH (ref 0–53)
AST: 22 U/L (ref 0–37)
Albumin: 4.2 g/dL (ref 3.5–5.2)
Alkaline Phosphatase: 217 U/L — ABNORMAL HIGH (ref 39–117)
BUN: 17 mg/dL (ref 6–23)
CO2: 23 mEq/L (ref 19–32)
Calcium: 9.3 mg/dL (ref 8.4–10.5)
Chloride: 105 mEq/L (ref 96–112)
Creat: 1.03 mg/dL (ref 0.50–1.35)
Glucose, Bld: 166 mg/dL — ABNORMAL HIGH (ref 70–99)
Potassium: 4 mEq/L (ref 3.5–5.3)
Sodium: 138 mEq/L (ref 135–145)
Total Bilirubin: 0.8 mg/dL (ref 0.2–1.2)
Total Protein: 6.9 g/dL (ref 6.0–8.3)

## 2014-02-25 LAB — IRON AND TIBC
%SAT: 36 % (ref 20–55)
Iron: 107 ug/dL (ref 42–165)
TIBC: 300 ug/dL (ref 215–435)
UIBC: 193 ug/dL (ref 125–400)

## 2014-02-25 LAB — LIPID PANEL
Cholesterol: 209 mg/dL — ABNORMAL HIGH (ref 0–200)
HDL: 28 mg/dL — ABNORMAL LOW (ref >39)
LDL Cholesterol: 143 mg/dL — ABNORMAL HIGH (ref 0–99)
Total CHOL/HDL Ratio: 7.5 Ratio
Triglycerides: 189 mg/dL — ABNORMAL HIGH (ref <150)
VLDL: 38 mg/dL (ref 0–40)

## 2014-02-25 NOTE — Patient Instructions (Signed)
We are checking your blood for reasons that your liver enzymes are elevated. I will call you with the results.   Do not take more than 4,000mg  of tylenol per day, and preferably take less than 3,000mg .   Merry Christmas!  - Dr. Bonner Puna

## 2014-02-25 NOTE — Progress Notes (Signed)
Patient ID: [name], male   DOB: 30-Apr-1933, 80 y.o.   MRN: [id number]  Subjective:  [name] is a 80 y.o. male returning to clinic for abnormal AST, ALT, alk phos.   He denies history of hepatitis. He does not and has never drunk alcoholic beverages or use illicit drugs. He has taken tylenol for years for chronic arthritis and back pain, but he does not believe this consistently exceeded 4g on any 1 day. He denies history of autoimmune diseases or FH. He drinks many Mountain Dews per day but has never been diagnosed with high blood sugar or cholesterol.   - Review of Systems: Per HPI.  - Smoking status noted Objective:  BP 146/98 mmHg  Pulse 99  Temp(Src) 97.6 F (36.4 C) (Oral)  Ht '5\' 11"'  (1.803 m)  Wt 188 lb (85.276 kg)  BMI 26.23 kg/m2 Gen:  80 y.o. male in no distress HEENT: Anicteric sclerae, conjunctivae normal, pupils equal and reactive, nares normal, moist mucous membranes, posterior oropharynx clear GI: Normoactive BS; soft, non-tender, non-distended, no hepatomegaly, spleen no felt. Skin: No rashes, wounds or jaundice/bronzening. No telangiectasias, palms without erythema.   Assessment/Plan:  [name] is a 80 y.o. male here for asymptomatic elevation of hepatic-associated enzymes.   Problem List Items Addressed This Visit      Other   Abnormal AST and ALT - Primary   Relevant Orders      Hepatitis B surface antigen      Hepatitis C antibody      Lipid panel      Comprehensive metabolic panel      POCT glycosylated hemoglobin (Hb A1C)   Hyperglycemia   Relevant Orders      POCT glycosylated hemoglobin (Hb A1C)

## 2014-02-25 NOTE — Assessment & Plan Note (Signed)
Asymptomatic elevation of AST, ALT, alk phos without a history of elevation dating back to labs from 2009 when he had a ruptured gallbladder. Had been using tylenol more than usual as prescribed by MD during acute illness. Pre-baby boomer without other risk factors. Other lab work significant for elevated ferritin. No A1c documented, so will check this and Fe panel to rule out hemochromatosis. Discussed with patient and family that this is likely an insignificant incidental finding but that initial work up is warranted for common causes, most commonly NAFLD. Recommended the discontinuation or limitation of tylenol and sugar-sweetened beverages.

## 2014-02-26 ENCOUNTER — Encounter: Payer: Self-pay | Admitting: Family Medicine

## 2014-02-26 LAB — HEPATITIS B SURFACE ANTIGEN: Hepatitis B Surface Ag: NEGATIVE

## 2014-02-26 LAB — HEPATITIS C ANTIBODY: HCV Ab: NEGATIVE

## 2014-02-27 ENCOUNTER — Other Ambulatory Visit: Payer: Self-pay | Admitting: Family Medicine

## 2014-03-07 DIAGNOSIS — Z9841 Cataract extraction status, right eye: Secondary | ICD-10-CM

## 2014-03-07 DIAGNOSIS — Z9842 Cataract extraction status, left eye: Secondary | ICD-10-CM

## 2014-03-07 HISTORY — DX: Cataract extraction status, left eye: Z98.42

## 2014-03-07 HISTORY — DX: Cataract extraction status, right eye: Z98.41

## 2014-03-21 ENCOUNTER — Telehealth: Payer: Self-pay | Admitting: *Deleted

## 2014-03-21 ENCOUNTER — Ambulatory Visit (INDEPENDENT_AMBULATORY_CARE_PROVIDER_SITE_OTHER): Payer: Medicare Other | Admitting: Family Medicine

## 2014-03-21 ENCOUNTER — Encounter: Payer: Self-pay | Admitting: Family Medicine

## 2014-03-21 VITALS — BP 130/79 | HR 83 | Ht 71.0 in | Wt 191.0 lb

## 2014-03-21 DIAGNOSIS — E1142 Type 2 diabetes mellitus with diabetic polyneuropathy: Secondary | ICD-10-CM | POA: Insufficient documentation

## 2014-03-21 DIAGNOSIS — E119 Type 2 diabetes mellitus without complications: Secondary | ICD-10-CM

## 2014-03-21 DIAGNOSIS — E1140 Type 2 diabetes mellitus with diabetic neuropathy, unspecified: Secondary | ICD-10-CM | POA: Insufficient documentation

## 2014-03-21 DIAGNOSIS — E1165 Type 2 diabetes mellitus with hyperglycemia: Secondary | ICD-10-CM

## 2014-03-21 LAB — HEMOGLOBIN A1C: A1c: 8.7

## 2014-03-21 MED ORDER — METFORMIN HCL 500 MG PO TABS: 500.0000 mg | ORAL_TABLET | Freq: Two times a day (BID) | ORAL | Status: DC

## 2014-03-21 NOTE — Assessment & Plan Note (Signed)
A1c 8.7 with HH RN.  Polydipsia, polyuria. Kidney function reviewed from 02/2014.  Normal -Will start Metformin 500mg  BID -Counseled on side effects and potential for GI upset -Counseled on Lifestyle modifications  -Patient voices good understanding of above. -Recommend follow up with Dr Bonner Puna in 4 weeks -Recommend repeat A1c in 3 months

## 2014-03-21 NOTE — Telephone Encounter (Signed)
Home Health NP called to get an appt for pt today.  A1C today 8.7 up from 6.7 02/25/14.  NP requested pt come in to speak with provider about being diabetic.  Derl Barrow, RN

## 2014-03-21 NOTE — Patient Instructions (Signed)
It was a pleasure seeing you today!  Information regarding what we discussed is included in this packet.  Please feel free to call our office if any questions or concerns arise.  You were seen today because your home health provider had concern about your blood sugar.  You are being started on Metformin for sugar control.  Please schedule an appointment with Dr Bonner Puna in 4 weeks to see how you are tolerating the medication.  The most common side effects are GI upset (diarrhea, bloating).  This will resolve after you have taken it for a couple of weeks.  We will retest your A1c in about 3 months.  [name] M. Lajuana Ripple, DO PGY-1, [facility]  Type 2 Diabetes Mellitus Type 2 diabetes mellitus is a long-term (chronic) disease. In type 2 diabetes:  The pancreas does not make enough of a hormone called insulin.  The cells in the body do not respond as well to the insulin that is made.  Both of the above can happen. Normally, insulin moves sugars from food into tissue cells. This gives you energy. If you have type 2 diabetes, sugars cannot be moved into tissue cells. This causes high blood sugar (hyperglycemia).  HOME CARE  Have your hemoglobin A1c level checked twice a year. The level shows if your diabetes is under control or out of control.  Test your blood sugar level every day as told by your doctor.  Check your ketone levels by testing your pee (urine) when you are sick and as told.  Take your diabetes or insulin medicine as told by your doctor.  Never run out of insulin.  Adjust how much insulin you give yourself based on how many carbs (carbohydrates) you eat. Carbs are in many foods, such as fruits, vegetables, whole grains, and dairy products.  Have a healthy snack between every healthy meal. Have 3 meals and 3 snacks a day.  Lose weight if you are overweight.  Carry a medical alert card or wear your medical alert jewelry.  Carry a 15-gram carb snack with you at all  times. Examples include:  Glucose pills, 3 or 4.  Glucose gel, 15-gram tube.  Raisins, 2 tablespoons (24 grams).  Jelly beans, 6.  Animal crackers, 8.  Regular (not diet) pop, 4 ounces (120 milliliters).  Gummy treats, 9.  Notice low blood sugar (hypoglycemia) symptoms, such as:  Shaking (tremors).  Trouble thinking clearly.  Sweating.  Faster heart rate.  Headache.  Dry mouth.  Hunger.  Crabbiness (irritability).  Being worried or tense (anxious).  Restless sleep.  A change in speech or coordination.  Confusion.  Treat low blood sugar right away. If you are alert and can swallow, follow the 15:15 rule:  Take 15-20 grams of a rapid-acting glucose or carb. This includes glucose gel, glucose pills, or 4 ounces (120 milliliters) of fruit juice, regular pop, or low-fat milk.  Check your blood sugar level 15 minutes after taking the glucose.  Take 15-20 grams more of glucose if the repeat blood sugar level is still 70 mg/dL (milligrams/deciliter) or below.  Eat a meal or snack within 1 hour of the blood sugar levels going back to normal.  Notice early symptoms of high blood sugar, such as:  Being really thirsty or drinking a lot (polydipsia).  Peeing a lot (polyuria).  Do at least 150 minutes of physical activity a week or as told.  Split the 150 minutes of activity up during the week. Do not do 150 minutes  of activity in one day.  Perform exercises, such as weight lifting, at least 2 times a week or as told.  Spend no more than 90 minutes at one time inactive.  Adjust your insulin or food intake as needed if you start a new exercise or sport.  Follow your sick-day plan when you are not able to eat or drink as usual.  Do not smoke, chew tobacco, or use electronic cigarettes.  Women who are not pregnant should drink no more than 1 drink a day. Men should drink no more than 2 drinks a day.  Only drink alcohol with food.  Ask your doctor if alcohol  is safe for you.  Tell your doctor if you drink alcohol several times during the week.  See your doctor regularly.  Schedule an eye exam soon after you are told you have diabetes. Schedule exams once every year.  Check your skin and feet every day. Check for cuts, bruises, redness, nail problems, bleeding, blisters, or sores. A doctor should do a foot exam once a year.  Brush your teeth and gums twice a day. Floss once a day. Visit your dentist regularly.  Share your diabetes plan with your workplace or school.  Stay up-to-date with shots that fight against diseases (immunizations).  Learn how to deal with stress.  Get diabetes education and support as needed.  Ask your doctor for special help if:  You need help to maintain or improve how you do things on your own.  You need help to maintain or improve the quality of your life.  You have foot or hand problems.  You have trouble cleaning yourself, dressing, eating, or doing physical activity. GET HELP IF:  You are unable to eat or drink for more than 6 hours.  You feel sick to your stomach (nauseous) or throw up (vomit) for more than 6 hours.  Your blood sugar level is over 240 mg/dL.  There is a change in mental status.  You get another serious illness.  You have watery poop (diarrhea) for more than 6 hours.  You have been sick or have had a fever for 2 or more days and are not getting better.  You have pain when you are active. GET HELP RIGHT AWAY IF:  You have trouble breathing.  Your ketone levels are higher than your doctor says they should be. MAKE SURE YOU:  Understand these instructions.  Will watch your condition.  Will get help right away if you are not doing well or get worse. Document Released: 12/01/2007 Document Revised: 07/08/2013 Document Reviewed: 09/23/2011 [facility] Patient Information 2015 [place], [place]. This information is not intended to replace advice given to you by your health care  provider. Make sure you discuss any questions you have with your health care provider.

## 2014-03-21 NOTE — Addendum Note (Signed)
Addended by: Vance Gather B on: 03/21/2014 04:14 PM   Modules accepted: Orders

## 2014-03-21 NOTE — Progress Notes (Signed)
Patient ID: [name], male   DOB: 1933-03-19, 81 y.o.   MRN: [id number]    Subjective: CC: New dx of T2DM HPI: Patient is a 81 y.o. male presenting to clinic today for T2DM. Concerns today include:  1. T2DM Patient seen by home health nurse.  A1c today 8.7.  Patient was not aware that his A1c at last visit was 6.7 and states that he did not know he was to be making lifestyle modifications.  He states that he normally eats a sugary cereal in morning and a Mountain Dew 20 oz each night.  He eats what he wants.  FamHx of T2DM.  Endorses polydipsia, polyuria.  ROS: All other systems reviewed and are negative.  Objective: Office vital signs reviewed. There were no vitals taken for this visit.  Physical Examination:  General: Awake, alert, well nourished, a bit worked up on appearance, NAD HEENT: Normal, EOMI MSK: Normal gait and station Psych: good eye contact, normal speech, appears anxious  Assessment: 81 y.o. male new dx of T2DM  Plan: See Problem List and After Visit Summary   Janora Norlander, DO PGY-1, [place]

## 2014-03-21 NOTE — Telephone Encounter (Signed)
Ok.  I see her on there.  Thanks so much!

## 2014-03-30 ENCOUNTER — Other Ambulatory Visit: Payer: Self-pay | Admitting: Family Medicine

## 2014-04-01 ENCOUNTER — Ambulatory Visit (INDEPENDENT_AMBULATORY_CARE_PROVIDER_SITE_OTHER): Payer: Medicare Other | Admitting: Pharmacist

## 2014-04-01 ENCOUNTER — Encounter: Payer: Self-pay | Admitting: Pharmacist

## 2014-04-01 VITALS — BP 126/80 | HR 80 | Ht 71.0 in | Wt 187.5 lb

## 2014-04-01 DIAGNOSIS — E118 Type 2 diabetes mellitus with unspecified complications: Secondary | ICD-10-CM

## 2014-04-01 MED ORDER — ONETOUCH ULTRASOFT LANCETS MISC: Status: DC

## 2014-04-01 MED ORDER — GLUCOSE BLOOD VI STRP: ORAL_STRIP | Status: DC

## 2014-04-01 NOTE — Patient Instructions (Signed)
Continue to take metformin twice a day with meals.   Continue to drink more water.   Eat more vegetables.    Eat less carbohydrates (starches) potatoes, pasta, rice, cereal and bread.   Drink less mountain dew and sweet tea.   Check your blood sugar in the morning - once daily.   A supply of blood glucose strips AND lancets has been sent to your pharmacy.   Next visit in pharmacy clinic in 3 weeks.  Please schedule this on the way out.

## 2014-04-01 NOTE — Progress Notes (Signed)
S:    Patient arrives in good spirits.   Accompanied by his wife and daughter.  Presents for diabetes education and medication management after recent diagnosis of diabetes.   Patient reports new diagnosis in December.   Meals include three meals per day.   Recently trying to cut down on carbs and eating more vegetables.   Patient reports adherence with medications. Current diabetes medications include:  Metformin.   Patient denies hypoglycemic events.    Patient reported dietary habits: eating "meat and potatoes" and likes to drink kool-aid, sweet tea AND a 24 ounce [facility] during the day.   Does NOT like to drink "water".    Patient reported exercise habits: previously going to the gym and walking in the swimming pool.      Patient reports nocturia X 2 per night. Patient reports visual changes for last 8-10 years.    O:  . Lab Results  Component Value Date   HGBA1C 6.7 02/25/2014    Reports A1C was rechecked recently and it was reported to be ~ 8.0.   Home fasting CBG: NOT checking   A/P: Diabetes recently diagnosed.   Denies hypoglycemic events and is able to verbalize appropriate hypoglycemia management plan.  Reports adherence with medication. Control is suboptimal due to dietary indiscretion and limited exercise.   Provided education on A1C goals, blood glucose goals, simple dietary education including reduction of "liquid calories" and reduction in carbohydrate intake.  Patient willing to commit to exercise at the swimming pool 5 days per week.  Instruction on OneTouch ultra mini provided.  Patient comfortable with using machine after walking through instructions with pharmacist.  Patient commits to checking blood glucose once in the morning before breakfast for most days of the week. Next A1C anticipated in 3 months.   Written patient instructions provided, education books and blood glucose log book.  Follow up in Pharmacist Clinic Visit in 1 month.   Total time in face to  face counseling 45 minutes.  Patient seen with Milus Glazier, PharmD Resident, and Kathrine Cords, DO physician resident.

## 2014-04-01 NOTE — Progress Notes (Signed)
Patient ID: [name], male   DOB: 04-29-1933, 81 y.o.   MRN: [id number] Reviewed: Agree with Dr. Graylin Shiver documentation and management.

## 2014-04-01 NOTE — Assessment & Plan Note (Addendum)
Diabetes recently diagnosed.   Denies hypoglycemic events and is able to verbalize appropriate hypoglycemia management plan.  Reports adherence with medication. Control is suboptimal due to dietary indiscretion and limited exercise.   Provided education on A1C goals, blood glucose goals, simple dietary education including reduction of "liquid calories" and reduction in carbohydrate intake.  Patient willing to commit to exercise at the swimming pool 5 days per week.  Instruction on OneTouch ultra mini provided.  Patient comfortable with using machine after walking through instructions with pharmacist.  Patient commits to checking blood glucose once in the morning before breakfast for most days of the week. Next A1C anticipated in 3 months.   Written patient instructions provided, education books and blood glucose log book.  Follow up in Pharmacist Clinic Visit in 1 month.   Total time in face to face counseling 45 minutes.  Patient seen with Milus Glazier, PharmD Resident, and Kathrine Cords, DO physician resident.

## 2014-04-03 ENCOUNTER — Telehealth: Payer: Self-pay | Admitting: Family Medicine

## 2014-04-03 NOTE — Telephone Encounter (Signed)
pts wife is asking to speak with RN, says pt was here for the diabetes class on how to use the meter, cannot get the meter to work right. Also says the new medication is has been taking is making him feel sick, and is sleeping a lot.

## 2014-04-04 NOTE — Telephone Encounter (Signed)
I think your team can help here. If not let me know. Thank you.

## 2014-04-07 ENCOUNTER — Encounter: Payer: Self-pay | Admitting: Family Medicine

## 2014-04-07 DIAGNOSIS — Z862 Personal history of diseases of the blood and blood-forming organs and certain disorders involving the immune mechanism: Secondary | ICD-10-CM | POA: Insufficient documentation

## 2014-04-10 ENCOUNTER — Telehealth: Payer: Self-pay | Admitting: Pharmacist

## 2014-04-10 NOTE — Telephone Encounter (Signed)
Attempted to follow up on blood glucose control and symptoms - No answer, no message left.

## 2014-04-11 ENCOUNTER — Telehealth: Payer: Self-pay | Admitting: Pharmacist

## 2014-04-11 NOTE — Telephone Encounter (Signed)
Patient at the [facility] swimming.   Wife states he is doing much better with blood sugar.  Readings have been 122 and 132 in the morning.   States [facility] pharmacist helped resolved meter issue.   Follow up planned in [facility] office next week.

## 2014-04-16 ENCOUNTER — Encounter: Payer: Self-pay | Admitting: Family Medicine

## 2014-04-16 ENCOUNTER — Ambulatory Visit (INDEPENDENT_AMBULATORY_CARE_PROVIDER_SITE_OTHER): Payer: Medicare Other | Admitting: Family Medicine

## 2014-04-16 VITALS — BP 133/75 | HR 78 | Temp 98.1°F | Ht 71.0 in | Wt 185.0 lb

## 2014-04-16 DIAGNOSIS — E119 Type 2 diabetes mellitus without complications: Secondary | ICD-10-CM

## 2014-04-16 DIAGNOSIS — L853 Xerosis cutis: Secondary | ICD-10-CM

## 2014-04-16 DIAGNOSIS — J4530 Mild persistent asthma, uncomplicated: Secondary | ICD-10-CM

## 2014-04-16 DIAGNOSIS — I10 Essential (primary) hypertension: Secondary | ICD-10-CM | POA: Diagnosis not present

## 2014-04-16 DIAGNOSIS — E785 Hyperlipidemia, unspecified: Secondary | ICD-10-CM | POA: Insufficient documentation

## 2014-04-16 DIAGNOSIS — M1000 Idiopathic gout, unspecified site: Secondary | ICD-10-CM | POA: Diagnosis not present

## 2014-04-16 DIAGNOSIS — M5126 Other intervertebral disc displacement, lumbar region: Secondary | ICD-10-CM

## 2014-04-16 DIAGNOSIS — L850 Acquired ichthyosis: Secondary | ICD-10-CM

## 2014-04-16 MED ORDER — AMMONIUM LACTATE 5 % EX LOTN: 1.0000 "application " | TOPICAL_LOTION | Freq: Every day | CUTANEOUS | Status: DC | PRN

## 2014-04-16 NOTE — Progress Notes (Signed)
Subjective: [name] is a 81 y.o. male here for diabetes follow up.   T2DM diagnosed in December with Hb A1c 6.7% Checks blood sugar at home. Presents log showing fasting AM CBGs: 114 - 133, most right at 120. Reports 100% medication compliance (takes metformin 500mg  BID) and has begun daily exercise (swimming) as well as completely cutting out SSB's (was drinking 24 oz SSB in addition to unknown quantity sweet tea daily along with carbohydrate-rich diet).  Last foot exam: 04/16/2014  Flu and pneumovax: UTD  - ROS: Denies fever, chills, weight loss, dizziness, vision changes, syncope, polyuria, nocturia, paresthesias, polydipsia, chest pain, new wounds.  All other systems reviewed and are negative. - [place]: Nonsmoker, denies EtOH PMH: Reviewed Medications: Reviewed and updated  Objective: BP 133/75 mmHg  Pulse 78  Temp(Src) 98.1 F (36.7 C) (Oral)  Ht 5\' 11"  (1.803 m)  Wt 185 lb (83.915 kg)  BMI 25.81 kg/m2 Gen: Well-appearing 80 y.o.male in NAD HEENT: Normocephalic, sclerae/conjunctivae clear, PERRL, MMM, posterior oropharynx clear Neck: neck supple, no masses appreciated; thyroid not enlarged  Pulm: Non-labored; CTAB, no wheezes  CV: Regular rate, no murmur appreciated; no LE edema, no JVD GI: Normoactive BS; soft, non-tender, non-distended, no HSM Skin: No wounds or rashes, pruritic areas are not inflamed or excoriated, no acanthosis nigricans See foot exam  Neuro: CN II-XII without deficits, sensation intact to light touch, steady gait. CREATININE 1.03 02/25/2014   HGBA1C 6.7 02/25/2014   CHOL 209* 02/25/2014  HDL 28* 02/25/2014  LDLCALC 143* 02/25/2014  LDLDIRECT 65 07/09/2013  TRIG 189* 02/25/2014  CHOLHDL 7.5 02/25/2014   Assessment & Plan: [name] is a 81 y.o. male here for recently diagnosed diabetes, well-controlled.    See problem list for problem-specific plans.

## 2014-04-16 NOTE — Assessment & Plan Note (Signed)
Controlled. Continue allopurinol as is.

## 2014-04-16 NOTE — Assessment & Plan Note (Addendum)
Multifactorial: Recent daily swimming in chlorinated pool, onset coincident with winter. Doubt medication side effect.  - Recommend moisturizing soap and eucerin lotion after swimming - Lac-hydrin prn

## 2014-04-16 NOTE — Assessment & Plan Note (Signed)
HDL: 28 LDL: 143 ASCVD 10-year risk: 30.1% (up to 49.1% if recent Dx T2DM is included) but has technically aged out of statin benefit group, per se.  - Will discuss inception of statin therapy at next office visit.

## 2014-04-16 NOTE — Assessment & Plan Note (Signed)
At goal today. Reports compliance without SEs of high or low BP.  - Continue lifestyle modifications and ARB

## 2014-04-16 NOTE — Assessment & Plan Note (Addendum)
Hb A1c: 6.7% 02/25/14. Goal Hb A1c: < 7.0% despite age given relatively good health and very short duration of dx.  - Obviously very motivated pt, making strong effort toward lifestyle modifications. Expect A1c improvement.  - Creatinine stable, continue ARB - Technically is aged out of statin-benefit group  - Follow up in 6 weeks to include Hb A1c, and foot exam. - Foot exam today was normal. Low risk for diabetic foot ulcer/infection. - Will need to discuss statin and ASA at follow up.

## 2014-04-16 NOTE — Assessment & Plan Note (Signed)
Stable on advair, singulair and prn albuterol.

## 2014-04-16 NOTE — Patient Instructions (Signed)
Congratulations on some amazing work you're putting into [facility]. Your numbers look great. Please follow up on/around March 22 so we can get a repeat hemoglobin A1c (a measure of your average blood sugar over 3 months). Keep up the good work.   - Also I recommend a moisturizing lotion and moisturizing soap. I prescribed lac-hydrin so you can pick that up at your pharmacy.   Take care, - Dr. Bonner Puna

## 2014-04-17 ENCOUNTER — Telehealth: Payer: Self-pay | Admitting: Family Medicine

## 2014-04-17 DIAGNOSIS — L853 Xerosis cutis: Secondary | ICD-10-CM

## 2014-04-17 MED ORDER — AMMONIUM LACTATE 5 % EX LOTN: 1.0000 "application " | TOPICAL_LOTION | Freq: Every day | CUTANEOUS | Status: DC | PRN

## 2014-04-17 NOTE — Telephone Encounter (Signed)
Pt calling again, called pharmacy, they told him they do not have the medication and we would have to resend

## 2014-04-17 NOTE — Telephone Encounter (Signed)
Resent medication: lac-hydrin lotion 226g tube with 1 refill (this was the only medication prescribed at yesterday's office visit). [facility] [address] who verified that they received the Rx THIS time. She verified no insurance issues and will call pt when ready.   Jarnell Cordaro B. Bonner Puna, MD, PGY-2 04/17/2014 5:13 PM

## 2014-04-17 NOTE — Telephone Encounter (Signed)
Pt called the pharmacy and was told that his medication was not there. I told him to call again, but can someone call and make sure that they received it. jw

## 2014-04-21 ENCOUNTER — Other Ambulatory Visit: Payer: Self-pay | Admitting: Family Medicine

## 2014-04-28 ENCOUNTER — Other Ambulatory Visit: Payer: Self-pay | Admitting: Family Medicine

## 2014-05-27 ENCOUNTER — Ambulatory Visit (INDEPENDENT_AMBULATORY_CARE_PROVIDER_SITE_OTHER): Payer: Medicare Other | Admitting: Family Medicine

## 2014-05-27 ENCOUNTER — Ambulatory Visit: Payer: Medicare Other | Admitting: Family Medicine

## 2014-05-27 ENCOUNTER — Encounter: Payer: Self-pay | Admitting: Family Medicine

## 2014-05-27 VITALS — BP 121/77 | HR 74 | Temp 97.9°F | Ht 71.0 in | Wt 181.0 lb

## 2014-05-27 DIAGNOSIS — E119 Type 2 diabetes mellitus without complications: Secondary | ICD-10-CM | POA: Diagnosis not present

## 2014-05-27 DIAGNOSIS — M79672 Pain in left foot: Secondary | ICD-10-CM | POA: Diagnosis not present

## 2014-05-27 LAB — POCT GLYCOSYLATED HEMOGLOBIN (HGB A1C): Hemoglobin A1C: 6.1

## 2014-05-27 NOTE — Assessment & Plan Note (Signed)
Hgb A1c today is 6.1%, improved from 8.7% in January. No hypoglycemic episodes reported (counseled regarding signs/symptoms of this). Wife has been a big influence on modifying his diet, reports cutting out all sodas and decreasing carb intake, increasing vegetables. Discussed continuing metformin currently, possibility in the future of coming off if he maintains his A1c, though more likely to continue as he seems to be tolerating this medicine well. F/u with PCP in 3 months.

## 2014-05-27 NOTE — Progress Notes (Signed)
   Subjective:    Patient ID: [name], male    DOB: 02/14/1934, 82 y.o.   MRN: [id number]  HPI  CC: check a1c, left foot pain  # DM:  Currently taking: Metformin 500mg  BID  CBG monitoring: this AM 94.   Wife reports significant changes in his diet: cut out mountain dew, eating more vegetables.   Reports no hypoglycemic episodes, has never had one in the past ROS: no dizzines, no headache, no changes in vision, no CP, no SOB  # Left foot pain  Single episode of left outside of heel pain. Occurred while standing and when any pressure was put on the foot  Went away the same day it was present  He does already have neuropathy and numbness on the outside of his left foot from a slipped disc. Neuropathy pain is well controlled from gabapentin  Review of Systems   See HPI for ROS. All other systems reviewed and are negative.  Past medical history, surgical, family, and social history reviewed and updated in the EMR as appropriate. Objective:  BP 121/77 mmHg  Pulse 74  Temp(Src) 97.9 F (36.6 C) (Oral)  Ht 5\' 11"  (1.803 m)  Wt 181 lb (82.101 kg)  BMI 25.26 kg/m2 Vitals and nursing note reviewed  General: NAD CV: RRR, normal s1s2, no mrg. 2+ PT pulses bilaterally Resp: CTAB, normal effort Ext:  Left FOOT exam: INSPECTION: no ulcers, no swelling, no ecchymosis PALPATION: nontender heel,  RANGE OF MOTION: intact eversion, inversion, dorsiflexion, plantar flexion STRENGTH: 5/5 throughout  NEUROVASCULAR STATUS: numb to light touch lateral aspect of foot. Good pulses.   Assessment & Plan:  See Problem List Documentation

## 2014-05-27 NOTE — Assessment & Plan Note (Signed)
Single episode that is now resolved. Unclear exactly what was causing this, could be neuropathic vs MSK (would favor MSK). Exam is normal today. Recommended he schedule another visit if it comes back and continues. Can try OTC foot cushion insole.

## 2014-05-27 NOTE — Patient Instructions (Signed)
Keep up the great work! Your A1c was 6.1.  Follow up in about 3 months.

## 2014-06-09 ENCOUNTER — Other Ambulatory Visit: Payer: Self-pay | Admitting: Family Medicine

## 2014-06-09 ENCOUNTER — Encounter: Payer: Self-pay | Admitting: Family Medicine

## 2014-06-09 ENCOUNTER — Ambulatory Visit (INDEPENDENT_AMBULATORY_CARE_PROVIDER_SITE_OTHER): Payer: Medicare Other | Admitting: Family Medicine

## 2014-06-09 VITALS — BP 120/81 | HR 88 | Temp 97.7°F | Ht 71.0 in | Wt 178.1 lb

## 2014-06-09 DIAGNOSIS — M722 Plantar fascial fibromatosis: Secondary | ICD-10-CM | POA: Diagnosis not present

## 2014-06-09 DIAGNOSIS — M79672 Pain in left foot: Secondary | ICD-10-CM | POA: Insufficient documentation

## 2014-06-09 MED ORDER — MELOXICAM 15 MG PO TABS: 15.0000 mg | ORAL_TABLET | Freq: Every day | ORAL | Status: DC

## 2014-06-09 MED ORDER — IBUPROFEN 600 MG PO TABS: 600.0000 mg | ORAL_TABLET | Freq: Four times a day (QID) | ORAL | Status: DC | PRN

## 2014-06-09 NOTE — Progress Notes (Signed)
Subjective: [name] is a 81 y.o. male patient of mine presenting for left foot pain.   Left foot hurts on outside and bottom x 2-3 weeks. Outside pain is tingling/numbness, and has been there for many years, not changed. Pain on bottom is worst when standing/walking a lot, worst at end of day, sometimes with first step in the morning. Nothing tried for this except rest which has helped. Formerly 10/10 now 3/10, not present without pressure. No injury to foot. No history of pain prior to this.   Recently diagnosed with diabetes, with great improvement in A1c after dietary modifications. Discussed aspirin and statin indications with him. He would like to take up this discussion at a later date and not start medications at this time. He reports blood sugars < 125 fasting.   - ROS: No fevers, myalgias, arthralgias.  - Nonsmoker  Objective: BP 120/81 mmHg  Pulse 88  Temp(Src) 97.7 F (36.5 C) (Oral)  Ht 5\' 11"  (1.803 m)  Wt 178 lb 1.6 oz (80.786 kg)  BMI 24.85 kg/m2 Gen: Well-appearing 81 y.o. male in no distress Left foot: No deformity, ecchymosis, or effusion of ankle noted. MTP and all other joints nontender. Tender to palpation along plantar fascia worst at proximal insertion. Neurovascularly intact.  Right foot: Normal  Assessment/Plan: [name] is a 81 y.o. male here for left foot pain.  See problem list for plan.

## 2014-06-09 NOTE — Patient Instructions (Addendum)
Try taking meloxicam as needed for this pain, and try to stay off the foot for the next few weeks. I suspect this will get better on its own, but if it doesn't we need to see you again in 3-4 weeks. You can also try stretching your foot backwards like I showed you.   Otherwise tschedule a follow up appointment with me in 2 months to check on your diabetes.

## 2014-06-09 NOTE — Assessment & Plan Note (Addendum)
Multiple components to left foot pain: Chronic neuropathic pain is stable. Plantar aspect, more acutely is most consistent on exam with plantar fasciitis. Discussed stretching exercises, relative rest and NSAIDs. No suspicion of occult fracture. Location not suggestive of metatarsalgia. Will follow up if not improved in 3-4 weeks. Otherwise, follow up for diabetes in about 2 months with A1c.

## 2014-06-24 DIAGNOSIS — E119 Type 2 diabetes mellitus without complications: Secondary | ICD-10-CM | POA: Diagnosis not present

## 2014-06-24 DIAGNOSIS — H52223 Regular astigmatism, bilateral: Secondary | ICD-10-CM | POA: Diagnosis not present

## 2014-07-14 ENCOUNTER — Other Ambulatory Visit: Payer: Self-pay | Admitting: Family Medicine

## 2014-07-18 DIAGNOSIS — H2512 Age-related nuclear cataract, left eye: Secondary | ICD-10-CM | POA: Diagnosis not present

## 2014-07-18 DIAGNOSIS — E119 Type 2 diabetes mellitus without complications: Secondary | ICD-10-CM | POA: Diagnosis not present

## 2014-07-18 DIAGNOSIS — H2513 Age-related nuclear cataract, bilateral: Secondary | ICD-10-CM | POA: Diagnosis not present

## 2014-07-24 ENCOUNTER — Encounter: Payer: Self-pay | Admitting: Family Medicine

## 2014-07-24 NOTE — Progress Notes (Signed)
Received request from [place] and [place] for clearance for bilateral cataract surgery. He has no documented symptoms or signs of coronary artery disease.   PMH: Asthma/COPD well controlled on spiriva and albuterol prn; well-controlled HTN and T2DM, dyslipidemia, anemia (last hgb 13.3).  PSH: Lap cholecystectomy noted 2009 (no anesthesia reactions noted) No ECG or ECHO on file.   Current outpatient prescriptions:  .  ADVAIR DISKUS 500-50 MCG/DOSE AEPB, INHALE 1 PUFF INTO THE LUNGS 2 (TWO) TIMES DAILY., Disp: 60 each, Rfl: 3 .  albuterol (PROVENTIL HFA;VENTOLIN HFA) 108 (90 BASE) MCG/ACT inhaler, Inhale 2 puffs into the lungs every 6 (six) hours as needed for wheezing or shortness of breath., Disp: 1 Inhaler, Rfl: 3 .  allopurinol (ZYLOPRIM) 300 MG tablet, TAKE 1 TABLET EVERY DAY, Disp: 30 tablet, Rfl: 5 .  ammonium lactate (LAC-HYDRIN) 5 % LOTN lotion, Apply 1 application topically daily as needed (dry skin / itching)., Disp: 226 g, Rfl: 1 .  gabapentin (NEURONTIN) 300 MG capsule, TAKE 2 CAPSULES (600 MG TOTAL) BY MOUTH 3 (THREE) TIMES DAILY., Disp: 180 capsule, Rfl: 3 .  glucose blood (ONE TOUCH ULTRA TEST) test strip, Use as instructed.  Dispense:   Quantity sufficient for one month supply of once daily testing., Disp: 100 each, Rfl: 12 .  ibuprofen (ADVIL,MOTRIN) 600 MG tablet, Take 1 tablet (600 mg total) by mouth every 6 (six) hours as needed for moderate pain., Disp: 30 tablet, Rfl: 0 .  Lancets (ONETOUCH ULTRASOFT) lancets, Use as instructed.  Dispense Quantity Sufficient for one month supply of once daily testing., Disp: 100 each, Rfl: 12 .  losartan (COZAAR) 50 MG tablet, Take 1 tablet (50 mg total) by mouth daily., Disp: 90 tablet, Rfl: 4 .  meloxicam (MOBIC) 15 MG tablet, TAKE 1 TABLET (15 MG TOTAL) BY MOUTH DAILY., Disp: 30 tablet, Rfl: 0 .  metFORMIN (GLUCOPHAGE) 500 MG tablet, TAKE 1 TABLET (500 MG TOTAL) BY MOUTH 2 (TWO) TIMES DAILY WITH A MEAL., Disp: 60 tablet,  Rfl: 2 .  SPIRIVA HANDIHALER 18 MCG inhalation capsule, PLACE 1 CAPSULE (18 MCG TOTAL) INTO INHALER AND INHALE DAILY., Disp: 30 capsule, Rfl: 5  This is a low risk surgery and his comorbidities portend a low risk of surgical complications.   Latausha Flamm B. Bonner Puna, MD, PGY-2 07/24/2014 10:04 AM

## 2014-07-31 DIAGNOSIS — H2512 Age-related nuclear cataract, left eye: Secondary | ICD-10-CM | POA: Diagnosis not present

## 2014-07-31 DIAGNOSIS — H25812 Combined forms of age-related cataract, left eye: Secondary | ICD-10-CM | POA: Diagnosis not present

## 2014-08-01 DIAGNOSIS — H2511 Age-related nuclear cataract, right eye: Secondary | ICD-10-CM | POA: Diagnosis not present

## 2014-08-10 ENCOUNTER — Other Ambulatory Visit: Payer: Self-pay | Admitting: Family Medicine

## 2014-08-13 ENCOUNTER — Ambulatory Visit (INDEPENDENT_AMBULATORY_CARE_PROVIDER_SITE_OTHER): Payer: Medicare Other | Admitting: Family Medicine

## 2014-08-13 ENCOUNTER — Encounter: Payer: Self-pay | Admitting: Family Medicine

## 2014-08-13 VITALS — BP 121/71 | HR 83 | Temp 98.4°F | Ht 71.0 in | Wt 179.8 lb

## 2014-08-13 DIAGNOSIS — T148 Other injury of unspecified body region: Secondary | ICD-10-CM | POA: Diagnosis not present

## 2014-08-13 DIAGNOSIS — E119 Type 2 diabetes mellitus without complications: Secondary | ICD-10-CM

## 2014-08-13 DIAGNOSIS — R490 Dysphonia: Secondary | ICD-10-CM

## 2014-08-13 DIAGNOSIS — T148XXA Other injury of unspecified body region, initial encounter: Secondary | ICD-10-CM | POA: Insufficient documentation

## 2014-08-13 LAB — POCT GLYCOSYLATED HEMOGLOBIN (HGB A1C): Hemoglobin A1C: 5.8

## 2014-08-13 NOTE — Assessment & Plan Note (Signed)
Wonder if this is related to ARB as symptoms appeared following increase of dose. Will continue to monitor until next visit. May switch to alternative ACE/ARB vs. decrease dose vs. switch to different antihypertensive (doubt he will develop diabetic nephropathy)

## 2014-08-13 NOTE — Patient Instructions (Signed)
Let's keep metformin on while it's not causing any symptoms. You can stop checking your blood sugars.  Let's keep cozaar on for now, though this may be the cause of the hoarseness.  Take a pain reliever as needed for this strain in your left hip/leg.  Follow up 6 months.

## 2014-08-13 NOTE — Assessment & Plan Note (Signed)
Of left upper thigh. Given the time course this is most likely explanation. Will monitor and follow up as needed. Analgesics prn.

## 2014-08-13 NOTE — Assessment & Plan Note (Signed)
Hb A1c: 5.8%, at goal without evidence of end-organ damage.  - Continue current treatment as he has no adverse SEs of metformin and Cr is normal, though we reviewed that he could likely come off metformin now that his diet and activity level are so much better.  - Creatinine stable, continue ARB (may switch as above) - Not on ASA or statin due to pt preference.  - Follow up in 6 months

## 2014-08-13 NOTE — Progress Notes (Signed)
Subjective: [name] is a 81 y.o. male here for diabetes follow up.   He was diagnosed with T2DM < 1 year ago and has been treated with dietary modifications and metformin 500mg  BID. 100% compliance. AM blood sugar records indicate 70s - 120s. No hypoglycemic readings or symptoms. He has continued to swim nearly daily except in the past week since cataract surgery.   Last eye exam: < 1 month Last foot exam: Last OV Flu and pneumovax: UTD  Also concerned about intermittent dry cough x 2 months with hoarseness that develops after speaking for several minutes. No respiratory difficulties, dysphagia, wheezing or stridor. This isn't incredibly bothersome but he wanted to bring it to my attention.   He's also had some left groin pain when standing from seated position for the past 2 weeks, remaining constant in severity, noted abruptly, without other aggravating (cough/valsalva/defecation) or alleviating (NSAIDs) factors.   - ROS: Denies fever, chills, weight loss, dizziness, vision changes, syncope, polyuria, nocturia, paresthesias, polydipsia, chest pain, new wounds.  All other systems reviewed and are negative. - [place]: married, nonsmoker, no EtOH, no illicit drugs. - Medications: reviewed and updated  Objective: BP 121/71 mmHg  Pulse 83  Temp(Src) 98.4 F (36.9 C) (Oral)  Ht 5\' 11"  (1.803 m)  Wt 179 lb 12.8 oz (81.557 kg)  BMI 25.09 kg/m2 Gen: Well-appearing 81 y.o.male in no distress HEENT: Normocephalic, sclerae/conjunctivae clear, PERRL, MMM, posterior oropharynx clear without tonsilomegaly or inflammation, dentures in place. Neck: Neck supple, no masses or lymphadenopathy; thyroid not enlarged  Pulm: Non-labored; CTAB, no wheezes  CV: Regular rate, no murmur appreciated; no LE edema, no JVD GI: Normoactive BS; soft, non-tender, non-distended, no HSM GU: No evidence of abnormality overlying site in inguinal fold where pain is produced. No direct, indirect, or femoral  hernias.  Skin: No wounds or rashes, no acanthosis nigricans Neuro: CN II-XII without deficits, sensation intact to light touch, steady gait. HGBA1C 6.1 05/27/2014   Assessment & Plan: [name] is a 81 y.o. male here for diabetes, well controlled.    See problem list for problem-specific plans.

## 2014-08-21 DIAGNOSIS — H25811 Combined forms of age-related cataract, right eye: Secondary | ICD-10-CM | POA: Diagnosis not present

## 2014-08-21 DIAGNOSIS — H2511 Age-related nuclear cataract, right eye: Secondary | ICD-10-CM | POA: Diagnosis not present

## 2014-08-22 ENCOUNTER — Other Ambulatory Visit: Payer: Self-pay | Admitting: Family Medicine

## 2014-09-06 ENCOUNTER — Other Ambulatory Visit: Payer: Self-pay | Admitting: Family Medicine

## 2014-09-07 ENCOUNTER — Other Ambulatory Visit: Payer: Self-pay | Admitting: Family Medicine

## 2014-10-15 ENCOUNTER — Other Ambulatory Visit: Payer: Self-pay | Admitting: Family Medicine

## 2014-11-13 ENCOUNTER — Other Ambulatory Visit: Payer: Self-pay | Admitting: Family Medicine

## 2014-12-01 ENCOUNTER — Other Ambulatory Visit: Payer: Self-pay | Admitting: Family Medicine

## 2014-12-12 ENCOUNTER — Other Ambulatory Visit: Payer: Self-pay | Admitting: Family Medicine

## 2014-12-12 ENCOUNTER — Ambulatory Visit
Admission: RE | Admit: 2014-12-12 | Discharge: 2014-12-12 | Disposition: A | Discharge status: Home / Self Care | Payer: Medicare Other | Source: Ambulatory Visit | Attending: Family Medicine | Admitting: Family Medicine

## 2014-12-12 ENCOUNTER — Ambulatory Visit (INDEPENDENT_AMBULATORY_CARE_PROVIDER_SITE_OTHER): Payer: Medicare Other | Admitting: Family Medicine

## 2014-12-12 ENCOUNTER — Encounter: Payer: Self-pay | Admitting: Family Medicine

## 2014-12-12 VITALS — BP 118/70 | HR 82 | Temp 97.8°F | Ht 71.0 in | Wt 181.5 lb

## 2014-12-12 DIAGNOSIS — M25552 Pain in left hip: Secondary | ICD-10-CM | POA: Insufficient documentation

## 2014-12-12 DIAGNOSIS — M1612 Unilateral primary osteoarthritis, left hip: Secondary | ICD-10-CM | POA: Diagnosis not present

## 2014-12-12 DIAGNOSIS — E119 Type 2 diabetes mellitus without complications: Secondary | ICD-10-CM | POA: Diagnosis not present

## 2014-12-12 DIAGNOSIS — Z23 Encounter for immunization: Secondary | ICD-10-CM | POA: Diagnosis not present

## 2014-12-12 LAB — POCT GLYCOSYLATED HEMOGLOBIN (HGB A1C): Hemoglobin A1C: 5.6

## 2014-12-12 MED ORDER — MELOXICAM 15 MG PO TABS: ORAL_TABLET | ORAL | Status: DC

## 2014-12-12 NOTE — Progress Notes (Signed)
Subjective: [name] is a 81 y.o. male here for diabetes follow up.   He was diagnosed with T2DM < 1 year ago and has been treated with dietary modifications (avoiding SSBs and carbohydrates) and metformin 500mg  BID. 100% compliance. AM blood sugar records indicate 70s - 110s. No hypoglycemic readings or symptoms. He has continued to swim nearly daily but is bothered by left hip pain.  Last eye exam: May 2016 Last foot exam: Last OV Flu: today  Reports 3 or 4 months of constant, moderate left hip (radiating into groin) pain when standing from seated position. Pain is aching with also sharp radiation from his left lower back down left leg to left foot with certain positions. Pain is overall much better with mobic but he has run out.  - ROS: Denies fever, chills, weight loss, dizziness, vision changes, syncope, polyuria, nocturia, paresthesias, polydipsia, chest pain, new wounds.  All other systems reviewed and are negative. - [place]: married, nonsmoker, no EtOH, no illicit drugs. - Medications: reviewed and updated  Objective: BP 118/70 mmHg  Pulse 82  Temp(Src) 97.8 F (36.6 C) (Oral)  Ht 5\' 11"  (1.803 m)  Wt 181 lb 8 oz (82.328 kg)  BMI 25.33 kg/m2 Gen: Well-appearing 81 y.o.male in no distress HEENT: Normocephalic, sclerae/conjunctivae clear, PERRL, MMM, posterior oropharynx clear without tonsilomegaly or inflammation, dentures in place. Neck: Neck supple, no masses or lymphadenopathy; thyroid not enlarged  Pulm: Non-labored; CTAB, no wheezes  CV: Regular rate, no murmur appreciated; no LE edema, no JVD GI: Normoactive BS; soft, non-tender, non-distended, no HSM GU: No evidence of abnormality overlying site in inguinal fold where pain is produced. No direct, indirect, or femoral hernias.  Skin: No wounds or rashes, no acanthosis nigricans Neuro: CN II-XII without deficits, sensation intact to light touch, steady gait. Lab Results  Component Value Date   HGBA1C 5.6  12/12/2014   Assessment & Plan: [name] is a 81 y.o. male here for diabetes, well controlled.    See problem list for problem-specific plans.

## 2014-12-12 NOTE — Assessment & Plan Note (Addendum)
Hb A1c actually has gone down even further to 5.6% without hypoglycemia. Will D/C home CBGs and continue with dietary and exercise modifications. Continue metformin. Given his age and mildness of "diabetes" our discussion has left [place] believing that adding aspirin and statin at this time may not improve his overall health risks. Continue ARB. Cr. Nl.  - F/u 6 months

## 2014-12-12 NOTE — Assessment & Plan Note (Addendum)
Seems like ongoing left hip OA-type pain without red flags superimposed on sacral nerve root compressive symptom.  - Check XR, continue mobic.  - Discussed role of repeat MRI if radiculopathy continues.

## 2014-12-12 NOTE — Patient Instructions (Signed)
Thank you for coming in today!  - Go get the left hip xray at [address] at your earliest convenience.  - Your diabetes i GREAT. So we can follow that up in 6 months. You can stop checking your sugars.  - Stay active.  - Flu shot today.  - I'll call you with these results and we'll discuss the next steps.   Our clinic's number is [phone]. Feel free to call any time with questions or concerns. We will answer any questions after hours with our 24-hour emergency line at that number as well.   - Dr. Bonner Puna

## 2014-12-12 NOTE — Addendum Note (Signed)
Addended by: Vance Gather B on: 12/12/2014 04:51 PM   Modules accepted: Orders

## 2014-12-22 ENCOUNTER — Ambulatory Visit (INDEPENDENT_AMBULATORY_CARE_PROVIDER_SITE_OTHER): Payer: Medicare Other | Admitting: Family Medicine

## 2014-12-22 VITALS — BP 138/82 | HR 81 | Temp 97.9°F | Wt 184.4 lb

## 2014-12-22 DIAGNOSIS — L850 Acquired ichthyosis: Secondary | ICD-10-CM

## 2014-12-22 DIAGNOSIS — L853 Xerosis cutis: Secondary | ICD-10-CM

## 2014-12-22 MED ORDER — TRIAMCINOLONE ACETONIDE 0.1 % EX CREA: 1.0000 "application " | TOPICAL_CREAM | Freq: Two times a day (BID) | CUTANEOUS | Status: DC

## 2014-12-22 NOTE — Patient Instructions (Signed)
Thank you for coming to see me today. It was a pleasure. Today we talked about:   Dry skin: I think your rash is probably from dry skin and irritation. I will prescribe a steroid cream to help with inflammation for you to use for up to two weeks. Please continuously apply lotion/cream to your body multiple times per day, as this should help as well. If symptoms do not improve, please return.  If you have any questions or concerns, please do not hesitate to call the office at [phone].  Sincerely,  Cordelia Poche, MD

## 2014-12-22 NOTE — Progress Notes (Signed)
    Subjective   [name] is a 81 y.o. male that presents for a same day visit  1. Rash: Symptoms started last week. Rash located on arms and left leg surfaces or arms and legs bilaterally. Rash is described as pumpy. Quality of rash is itchy. Patient has tried alcohol and lotion for his symptoms which have helped, however rash comes back. Nothing makes his symptoms worse. No fevers, nausea, vomiting, malaise. No known contacts.   ROS Per HPI  Social History  Substance Use Topics  . Smoking status: Former Smoker -- 0.50 packs/day    Types: Cigars    Quit date: 06/08/2011  . Smokeless tobacco: Never Used  . Alcohol Use: No    No Known Allergies  Objective   BP 153/97 mmHg  Pulse 81  Temp(Src) 97.9 F (36.6 C) (Oral)  Wt 184 lb 6.4 oz (83.643 kg)  General: Well appearing, no distress Skin: Dry skin throughout. Papular rash on extensor surfaces of elbows.   Assessment and Plan   Meds ordered this encounter  Medications  . triamcinolone cream (KENALOG) 0.1 %    Sig: Apply 1 application topically 2 (two) times daily.    Dispense:  30 g    Refill:  0    Dry skin dermatitis  Triamcinolone 0.1% BID for up to two weeks  Frequent moisturizing  Decrease number of showers per day

## 2014-12-24 ENCOUNTER — Other Ambulatory Visit: Payer: Self-pay | Admitting: Family Medicine

## 2015-01-20 ENCOUNTER — Other Ambulatory Visit: Payer: Self-pay | Admitting: Family Medicine

## 2015-01-26 ENCOUNTER — Encounter: Payer: Self-pay | Admitting: Family Medicine

## 2015-01-26 ENCOUNTER — Ambulatory Visit (INDEPENDENT_AMBULATORY_CARE_PROVIDER_SITE_OTHER): Payer: Medicare Other | Admitting: Family Medicine

## 2015-01-26 VITALS — BP 177/102 | HR 84 | Temp 98.7°F | Wt 187.3 lb

## 2015-01-26 DIAGNOSIS — I10 Essential (primary) hypertension: Secondary | ICD-10-CM | POA: Diagnosis not present

## 2015-01-26 DIAGNOSIS — J441 Chronic obstructive pulmonary disease with (acute) exacerbation: Secondary | ICD-10-CM | POA: Insufficient documentation

## 2015-01-26 MED ORDER — PREDNISONE 50 MG PO TABS: 50.0000 mg | ORAL_TABLET | Freq: Every day | ORAL | Status: DC

## 2015-01-26 MED ORDER — AZITHROMYCIN 250 MG PO TABS: ORAL_TABLET | ORAL | Status: DC

## 2015-01-26 MED ORDER — BENZONATATE 200 MG PO CAPS: 200.0000 mg | ORAL_CAPSULE | Freq: Three times a day (TID) | ORAL | Status: DC | PRN

## 2015-01-26 NOTE — Assessment & Plan Note (Signed)
Concern for COPD exacerbation given increased cough and sputum production, though no increased dyspnea and no wheezing. Currently stable on room air with no focal findings on exam. COPD exacerbation possibly triggered by viral URI given or recent increased smoke in the area from the [place]. No signs or symptoms to suggest pneumonia or other bacterial infection. Will treat with 5 days of prednisone and azithromycin. Will give tessalon for cough. Return precautions reviewed.

## 2015-01-26 NOTE — Patient Instructions (Addendum)
You have an asthma/COPD exacerbation. We will give you 5 days of steroids and antibiotics. We will also give you a cough medication.  Please let us know if you are not improving in 5-7 days.  Take care,  Dr Jerline Pain  Chronic Obstructive Pulmonary Disease Exacerbation Chronic obstructive pulmonary disease (COPD) is a common lung problem. In COPD, the flow of air from the lungs is limited. COPD exacerbations are times that breathing gets worse and you need extra treatment. Without treatment they can be life threatening. If they happen often, your lungs can become more damaged. If your COPD gets worse, your doctor may treat you with:  Medicines.  Oxygen.  Different ways to clear your airway, such as using a mask. HOME CARE  Do not smoke.  Avoid tobacco smoke and other things that bother your lungs.  If given, take your antibiotic medicine as told. Finish the medicine even if you start to feel better.  Only take medicines as told by your doctor.  Drink enough fluids to keep your pee (urine) clear or pale yellow (unless your doctor has told you not to).  Use a cool mist machine (vaporizer).  If you use oxygen or a machine that turns liquid medicine into a mist (nebulizer), continue to use them as told.  Keep up with shots (vaccinations) as told by your doctor.  Exercise regularly.  Eat healthy foods.  Keep all doctor visits as told. GET HELP RIGHT AWAY IF:  You are very short of breath and it gets worse.  You have trouble talking.  You have bad chest pain.  You have blood in your spit (sputum).  You have a fever.  You keep throwing up (vomiting).  You feel weak, or you pass out (faint).  You feel confused.  You keep getting worse. MAKE SURE YOU:  Understand these instructions.  Will watch your condition.  Will get help right away if you are not doing well or get worse.   This information is not intended to replace advice given to you by your health care  provider. Make sure you discuss any questions you have with your health care provider.   Document Released: 02/10/2011 Document Revised: 03/14/2014 Document Reviewed: 10/26/2012 Elsevier Interactive Patient Education [employer].

## 2015-01-26 NOTE — Progress Notes (Signed)
    Subjective:  [name] is a 81 y.o. male who presents to the [facility] today for same day appointment with a chief complaint of cough.   HPI:  Cough Patient presents with 4-5 days of heavy, occasionally productive cough. Cough is productive of mucus. No increased shortness of breath. No wheezing. No fevers or chills. No rhinorrhea. Has tried several OTC remedies including nyquil, Vick's vapor rub, Alka-selzter without significant relief. Has been compliant with spiriva and advair. Has not been using albuterol inhaler. No chest pain. No lower extremity swelling.   ROS: Per HPI  PMH:  The following were reviewed and entered/updated in epic: Past Medical History  Diagnosis Date  . Arthritis   . Hypertension   . CHOLECYSTECTOMY, LAPAROSCOPIC, HX OF 04/12/2007    Qualifier: Diagnosis of  By: Netty Starring  MD, Lucianne Muss    . ANEMIA, IRON DEF, NOS 01/15/2007    Qualifier: Diagnosis of  By: Jeannine Kitten MD, Matthew     Patient Active Problem List   Diagnosis Date Noted  . COPD exacerbation ([place]) 01/26/2015  . Left hip pain 12/12/2014  . Hoarseness 08/13/2014  . Muscle strain 08/13/2014  . Plantar fasciitis of left foot 06/09/2014  . Pain of left heel 05/27/2014  . Dry skin dermatitis 04/16/2014  . Dyslipidemia 04/16/2014  . History of anemia 04/07/2014  . Type 2 diabetes mellitus ([place]) 03/21/2014  . Abnormal AST and ALT 02/25/2014  . Sacral nerve root compression 06/07/2011  . Herniated nucleus pulposus, L4-5 12/24/2010  . Hypertension 05/07/2010  . COPD 03/20/2007  . Gout 01/15/2007  . Asthma 01/15/2007   Past Surgical History  Procedure Laterality Date  . Cholecystectomy      Objective:  Physical Exam: BP 177/102 mmHg  Pulse 84  Temp(Src) 98.7 F (37.1 C) (Oral)  Wt 187 lb 4.8 oz (84.959 kg)  SpO2 94%  Gen: NAD, resting comfortably, speaking in full sentences CV: RRR with no murmurs appreciated Lungs: NWOB, CTAB with no crackles, wheezes, or rhonchi GI: Normal bowel  sounds present. Soft, Nontender, Nondistended. MSK: no edema or cyanosis noted Skin: warm, dry Neuro: grossly normal, moves all extremities Psych: Normal affect and thought content   Assessment/Plan:  COPD exacerbation (HCC) Concern for COPD exacerbation given increased cough and sputum production, though no increased dyspnea and no wheezing. Currently stable on room air with no focal findings on exam. COPD exacerbation possibly triggered by viral URI given or recent increased smoke in the area from the [place]. No signs or symptoms to suggest pneumonia or other bacterial infection. Will treat with 5 days of prednisone and azithromycin. Will give tessalon for cough. Return precautions reviewed.   Hypertension Elevated today ro 177/102 today. Patient left office prior to recheck. Will defer to PCP for further management.   Algis Greenhouse. Jerline Pain, [facility] Resident PGY-2 01/26/2015 3:57 PM

## 2015-01-26 NOTE — Assessment & Plan Note (Addendum)
Elevated today ro 177/102 today. Patient left office prior to recheck. Will defer to PCP for further management.

## 2015-03-09 ENCOUNTER — Other Ambulatory Visit: Payer: Self-pay | Admitting: Family Medicine

## 2015-04-27 ENCOUNTER — Other Ambulatory Visit: Payer: Self-pay | Admitting: Family Medicine

## 2015-05-10 ENCOUNTER — Other Ambulatory Visit: Payer: Self-pay | Admitting: Family Medicine

## 2015-06-09 ENCOUNTER — Ambulatory Visit (INDEPENDENT_AMBULATORY_CARE_PROVIDER_SITE_OTHER): Payer: PPO | Admitting: Family Medicine

## 2015-06-09 VITALS — BP 142/95 | HR 92 | Temp 97.8°F | Ht 71.0 in | Wt 193.3 lb

## 2015-06-09 DIAGNOSIS — E119 Type 2 diabetes mellitus without complications: Secondary | ICD-10-CM | POA: Diagnosis not present

## 2015-06-09 DIAGNOSIS — M79672 Pain in left foot: Secondary | ICD-10-CM | POA: Diagnosis not present

## 2015-06-09 LAB — POCT GLYCOSYLATED HEMOGLOBIN (HGB A1C): Hemoglobin A1C: 6.1

## 2015-06-09 MED ORDER — ACETAMINOPHEN 325 MG PO TABS: 650.0000 mg | ORAL_TABLET | Freq: Four times a day (QID) | ORAL | Status: DC | PRN

## 2015-06-09 NOTE — Patient Instructions (Signed)
Thank you for coming in today!  - Start taking tylenol as directed for the pain. If this doesn't help, you can add aleve daily.  - Go pick up a Dr. Felicie Morn insert for your shoes and try gradually walking more and more as tolerated with this.  - If no better in 4 weeks or so, please return to discuss further options.   Our clinic's number is [phone]. Feel free to call any time with questions or concerns. We will answer any questions after hours with our 24-hour emergency line at that number as well.   - Dr. Bonner Puna

## 2015-06-10 NOTE — Progress Notes (Signed)
Subjective: [name] is a 82 y.o. male with T2DM treated with lifestyle modifications presenting for left foot pain.   He reports several weeks-to-months of sharp pain on the bottom of his left heel that is intermittent, worse with walking, not improved as he continues to walk, and was present at rest for the first time today. He has a history of plantar fasciitis but does not believe this is the same pain, it is more lateral than that pain and not as pronounced with the first steps in the morning. Denies trauma, swelling, or redness. Has not tried to change shoes, no medications tried. He is still able to swim but has not been able to walk/exercise as much and has thus gained some weight.   - ROS: As above - Non-smoker  Objective: BP 142/95 mmHg  Pulse 92  Temp(Src) 97.8 F (36.6 C) (Oral)  Ht 5\' 11"  (1.803 m)  Wt 193 lb 4.8 oz (87.68 kg)  BMI 26.97 kg/m2 Gen: Well-appearing 82 y.o. male appearing younger than stated age in no distress Right foot: Normal Left foot: No deformities, ulcerations or skin breakdown. + Mild tenderness to palpation on lateral calcaneus not at plantar fascia insertion without edema. Sensation intact to monofilament testing. PT and DP pulse 2+  Assessment/Plan: [name] is a 82 y.o. male here for left foot pain.   See problem-based charting.

## 2015-07-26 ENCOUNTER — Other Ambulatory Visit: Payer: Self-pay | Admitting: Family Medicine

## 2015-08-24 ENCOUNTER — Other Ambulatory Visit: Payer: Self-pay | Admitting: Family Medicine

## 2015-09-16 ENCOUNTER — Encounter: Payer: Self-pay | Admitting: Family Medicine

## 2015-09-16 ENCOUNTER — Ambulatory Visit (INDEPENDENT_AMBULATORY_CARE_PROVIDER_SITE_OTHER): Payer: PPO | Admitting: Family Medicine

## 2015-09-16 VITALS — BP 134/81 | HR 92 | Temp 98.7°F | Wt 192.2 lb

## 2015-09-16 DIAGNOSIS — R059 Cough, unspecified: Secondary | ICD-10-CM

## 2015-09-16 DIAGNOSIS — E119 Type 2 diabetes mellitus without complications: Secondary | ICD-10-CM

## 2015-09-16 DIAGNOSIS — R05 Cough: Secondary | ICD-10-CM

## 2015-09-16 DIAGNOSIS — R350 Frequency of micturition: Secondary | ICD-10-CM

## 2015-09-16 DIAGNOSIS — R5383 Other fatigue: Secondary | ICD-10-CM

## 2015-09-16 LAB — BASIC METABOLIC PANEL WITH GFR
BUN: 17 mg/dL (ref 7–25)
CO2: 22 mmol/L (ref 20–31)
Calcium: 9.3 mg/dL (ref 8.6–10.3)
Chloride: 105 mmol/L (ref 98–110)
Creat: 1.3 mg/dL — ABNORMAL HIGH (ref 0.70–1.11)
GFR, Est African American: 60 mL/min (ref >=60)
GFR, Est Non African American: 52 mL/min — ABNORMAL LOW (ref >=60)
Glucose, Bld: 118 mg/dL — ABNORMAL HIGH (ref 65–99)
Potassium: 4.8 mmol/L (ref 3.5–5.3)
Sodium: 137 mmol/L (ref 135–146)

## 2015-09-16 LAB — POCT URINALYSIS DIPSTICK
Bilirubin, UA: NEGATIVE
Blood, UA: NEGATIVE
Glucose, UA: NEGATIVE
Ketones, UA: NEGATIVE
Leukocytes, UA: NEGATIVE
Nitrite, UA: NEGATIVE
Protein, UA: NEGATIVE
Spec Grav, UA: 1.01
Urobilinogen, UA: 0.2
pH, UA: 5.5

## 2015-09-16 LAB — CBC
HCT: 41.3 % (ref 38.5–50.0)
Hemoglobin: 13.9 g/dL (ref 13.2–17.1)
MCH: 26.7 pg — ABNORMAL LOW (ref 27.0–33.0)
MCHC: 33.7 g/dL (ref 32.0–36.0)
MCV: 79.4 fL — ABNORMAL LOW (ref 80.0–100.0)
MPV: 10.7 fL (ref 7.5–12.5)
Platelets: 192 10*3/uL (ref 140–400)
RBC: 5.2 MIL/uL (ref 4.20–5.80)
RDW: 16 % — ABNORMAL HIGH (ref 11.0–15.0)
WBC: 12.3 10*3/uL — ABNORMAL HIGH (ref 3.8–10.8)

## 2015-09-16 LAB — GLUCOSE, CAPILLARY: Glucose-Capillary: 131 mg/dL — ABNORMAL HIGH (ref 65–99)

## 2015-09-16 LAB — POCT GLYCOSYLATED HEMOGLOBIN (HGB A1C): Hemoglobin A1C: 6.5

## 2015-09-16 LAB — TSH: TSH: 2.2 mIU/L (ref 0.40–4.50)

## 2015-09-16 MED ORDER — ALBUTEROL SULFATE (2.5 MG/3ML) 0.083% IN NEBU
2.5000 mg | INHALATION_SOLUTION | Freq: Once | RESPIRATORY_TRACT | Status: AC
  Administered 2015-09-16: 2.5 mg via RESPIRATORY_TRACT

## 2015-09-16 MED ORDER — IPRATROPIUM BROMIDE 0.02 % IN SOLN
0.5000 mg | Freq: Once | RESPIRATORY_TRACT | Status: AC
  Administered 2015-09-16: 0.5 mg via RESPIRATORY_TRACT

## 2015-09-16 NOTE — Patient Instructions (Addendum)
I will contact you will the results of your labs.  If anything is abnormal, I will call you.  Otherwise, expect a copy to be mailed to you.  I recommend that you use your albuterol for cough to see if this helps.  If you notice continued cough over the next week or develop fevers, chills.  Please return to office, you may need antibiotics.  Fatigue Fatigue is feeling tired all of the time, a lack of energy, or a lack of motivation. Occasional or mild fatigue is often a normal response to activity or life in general. However, long-lasting (chronic) or extreme fatigue may indicate an underlying medical condition. HOME CARE INSTRUCTIONS  Watch your fatigue for any changes. The following actions may help to lessen any discomfort you are feeling:  Talk to your health care provider about how much sleep you need each night. Try to get the required amount every night.  Take medicines only as directed by your health care provider.  Eat a healthy and nutritious diet. Ask your health care provider if you need help changing your diet.  Drink enough fluid to keep your urine clear or pale yellow.  Practice ways of relaxing, such as yoga, meditation, massage therapy, or acupuncture.  Exercise regularly.   Change situations that cause you stress. Try to keep your work and personal routine reasonable.  Do not abuse illegal drugs.  Limit alcohol intake to no more than 1 drink per day for nonpregnant women and 2 drinks per day for men. One drink equals 12 ounces of beer, 5 ounces of wine, or 1 ounces of hard liquor.  Take a multivitamin, if directed by your health care provider. SEEK MEDICAL CARE IF:   Your fatigue does not get better.  You have a fever.   You have unintentional weight loss or gain.  You have headaches.   You have difficulty:   Falling asleep.  Sleeping throughout the night.  You feel angry, guilty, anxious, or sad.   You are unable to have a bowel movement  (constipation).   You skin is dry.   Your legs or another part of your body is swollen.  SEEK IMMEDIATE MEDICAL CARE IF:   You feel confused.   Your vision is blurry.  You feel faint or pass out.   You have a severe headache.   You have severe abdominal, pelvic, or back pain.   You have chest pain, shortness of breath, or an irregular or fast heartbeat.   You are unable to urinate or you urinate less than normal.   You develop abnormal bleeding, such as bleeding from the rectum, vagina, nose, lungs, or nipples.  You vomit blood.   You have thoughts about harming yourself or committing suicide.   You are worried that you might harm someone else.    This information is not intended to replace advice given to you by your health care provider. Make sure you discuss any questions you have with your health care provider.   Document Released: 12/19/2006 Document Revised: 03/14/2014 Document Reviewed: 06/25/2013 Elsevier Interactive Patient Education [employer].

## 2015-09-16 NOTE — Addendum Note (Signed)
Addended by: Leonia Corona R on: 09/16/2015 01:53 PM   Modules accepted: Orders

## 2015-09-16 NOTE — Progress Notes (Signed)
Subjective: CC: polyuria/ frequency [id number] [name] is a 82 y.o. male presenting to clinic today for same day appointment. PCP: Georges Lynch, MD Concerns today include:  1. Fatigue/ generalized ill feeling Patient notes that he has been feeling bad for a couple of weeks.  Has a had a harsh cough for about 1 month.  Occ productive.  No hemoptysis.  He notes that has a history of asthma and get a cough.  Not having fevers, chills, nausea, vomiting, diarrhea or come in contact w/ sick persons.  Does not use albuterol unless SOB.    Notes that he has constipation at baseline.  Notes decreased appetite.  Last BM was yesterday.  Takes Magnesium Citrate PRN constipation, which works well.  Additionally, he states that symptoms seemed to start once his wife stopped cooking filling meals for him.  He reports that his daughters are back home and that his wife is not cooking for him normally.  He attributes his symptoms to this.  She reports that he is not agreeable to eating fresh fruits and veggies and therefore has not been eating much.  She is trying to get him to eat healthier.  She also reports that he had some gas/ reflux symptoms about 3 weeks ago for which he took Peptobismol.  He denies any epigastric pain, bloating or belching since.  Endorsing urinary frequency that started 2-3 weeks ago.  He notes that he wakes up 2-3 x at night time to urinate.  He reports drinking lots of fluids before bed and each time he wakes up to urinate during the night.  No hematuria, dysuria, abdominal pain, hematochezia, melena, CP, SOB, LE edema, DOE, dizziness, new back pain.  Never had prostate issues.  Has not had a prostate exam in some time.  No h/o prostate cancer.  He reports that he discontinued Neurontin about 1 month ago because it was not helping his pain in his LE.  He also does not take Metformin anymore.  He is controlling DM with diet and exercise.  He denies feelings of sadness or depression.   His wife though worries that he might be stressed out with his daughters being home.  Denies moodiness, SI/HI.  Social History Reviewed: non smoker. FamHx and MedHx reviewed.  Please see EMR.  ROS: Per HPI  Objective: Office vital signs reviewed. BP 134/81 mmHg  Pulse 92  Temp(Src) 98.7 F (37.1 C) (Oral)  Wt 192 lb 3.2 oz (87.181 kg)  SpO2 98%  Physical Examination:  General: Awake, alert, well nourished, well appearing male, No acute distress, accompanied to appt by wife HEENT: Normal, sclera anicteric, MMM Cardio: regular rate and rhythm, S1S2 heard, no murmurs appreciated Pulm: mild globally decreased breath sounds otherwise clear to auscultation bilaterally, no wheezes, rhonchi or rales; normal WOB on room air GI: soft, mildly tender to epigastrium, non-distended, bowel sounds present x4, no hepatomegaly, no splenomegaly GU: no suprapubic TTP, no CVA TTP Extremities: warm, well perfused, No LE edema; +2 radial pulses bilaterally MSK: Normal gait and station Psych: somewhat poor eye contact, blunted affect, mood stable  PHQ-9 score 0  Results for orders placed or performed in visit on 09/16/15 (from the past 24 hour(s))  POCT urinalysis dipstick     Status: None   Collection Time: 09/16/15  9:47 AM  Result Value Ref Range   Color, UA YELLOW    Clarity, UA CLEAR    Glucose, UA NEG    Bilirubin, UA NEG    Ketones, UA  NEG    Spec Grav, UA 1.010    Blood, UA NEG    pH, UA 5.5    Protein, UA NEG    Urobilinogen, UA 0.2    Nitrite, UA NEG    Leukocytes, UA Negative Negative   Narrative   Biochemical negative, microscopic not indicated  POCT A1C     Status: None   Collection Time: 09/16/15  9:53 AM  Result Value Ref Range   Hemoglobin A1C 6.5   Glucose, capillary     Status: Abnormal   Collection Time: 09/16/15 10:06 AM  Result Value Ref Range   Glucose-Capillary 131 (H) 65 - 99 mg/dL   Assessment/ Plan: 82 y.o. male   1. Type 2 diabetes mellitus without  complication, unspecified long term insulin use status ([place]), controlled with diet.  A1c today 6.5.  UA negative for glucose or ketones. - Continue diet and exercise modification - POCT A1C - POCT urinalysis dipstick  2. Other fatigue, unsure etiology at this time.  Lungs with slightly decreased breath sounds but otherwise unremarkable.  No evidence of infection on my exam.  Will check basic labs to further evaluate.  Though, I have some concern that it may related to mood.  He was very stoic during our appointment and was quick to dismiss his wife's concerns about emotional health.  PHQ-9 was 0.  I also considered that he may really have decreased energy from decreased intake, as it seemed that he and his wife are quarreling over the new healthy diet regimen.  He seems to be dissatisfied with all of the fresh veggies in his diet, as he focused on this heavily during the appointment. - Would revisit emotional health w. Repeat PHQ-9 and possibly GAD-7. - TSH - CBC - BASIC METABOLIC PANEL WITH GFR  3. Cough, h/o asthma/ COPD.  Sounds compliant with controller meds.  Normal O2 saturation.  Some global decreased BS on exam today.  No fevers, sputum production, CP, wheeze/crackles, so I did not treat as a COPD exacerbation.   - Duoneb x1 in office - Recommend using Albuterol prn wheeze, SOB and COUGH.  Discussed that cough can be a presenting symptom of asthma exacerbation. - Would consider CXR or empiric trx for COPD if continued cough.  Discussed this with patient and recommended that if no improvement w/ albuterol, he should return for reevaluation.  4. Urinary frequency. UA negative for evidence of infection, renal stone.  Could be BPH. - Discussed avoiding fluids before bedtime. - Monitor for fevers, dysuria - If persists despite changes, would recommend return visit for prostate exam and PSA level - Return precautions reviewed - Follow up with PCP prn   Janora Norlander, DO PGY-3, [facility]

## 2015-09-17 ENCOUNTER — Encounter: Payer: Self-pay | Admitting: Family Medicine

## 2015-09-17 ENCOUNTER — Telehealth: Payer: Self-pay | Admitting: Family Medicine

## 2015-09-17 ENCOUNTER — Other Ambulatory Visit: Payer: Self-pay | Admitting: Family Medicine

## 2015-09-17 DIAGNOSIS — D72829 Elevated white blood cell count, unspecified: Secondary | ICD-10-CM

## 2015-09-17 DIAGNOSIS — N179 Acute kidney failure, unspecified: Secondary | ICD-10-CM

## 2015-09-17 NOTE — Telephone Encounter (Signed)
Contacted pt and informed him of below and placed referral in [place] workqueue. Katharina Caper, April D, [place]

## 2015-09-17 NOTE — Telephone Encounter (Signed)
Attempted to call patient to discuss labs.  Patient has an AKI (bump in his kidney function).  This could be from dehydration or could be a result of diabetes.  I would like to obtain a repeat BMP and a UA sometime next week to check this further.  This could explain why he has not been feeling well.  Additionally, he has a chronically elevated WBC (this is unusual) and I would like for him to see a specialist about this if he has not done so in the past.  I will place a referral to hematology for further evaluation.  Thyroid function was normal, electrolytes were normal, no anemia.  Please let patient know this if he calls the office back.    CBC    Component Value Date/Time   WBC 12.3* 09/16/2015 1055   RBC 5.20 09/16/2015 1055   RBC 3.28* 01/11/2007 0343   HGB 13.9 09/16/2015 1055   HCT 41.3 09/16/2015 1055   PLT 192 09/16/2015 1055   MCV 79.4* 09/16/2015 1055   MCH 26.7* 09/16/2015 1055   MCHC 33.7 09/16/2015 1055   RDW 16.0* 09/16/2015 1055   LYMPHSABS 4.1* 07/09/2013 0922   MONOABS 0.8 07/09/2013 0922   EOSABS 0.6 07/09/2013 0922   BASOSABS 0.0 07/09/2013 0922   BMP Latest Ref Rng 09/16/2015 02/25/2014 02/17/2014  Glucose 65 - 99 mg/dL 118(H) 166(H) 224(H)  BUN 7 - 25 mg/dL 17 17 11   Creatinine 0.70 - 1.11 mg/dL 1.30(H) 1.03 1.07  Sodium 135 - 146 mmol/L 137 138 135  Potassium 3.5 - 5.3 mmol/L 4.8 4.0 4.1  Chloride 98 - 110 mmol/L 105 105 100  CO2 20 - 31 mmol/L 22 23 22   Calcium 8.6 - 10.3 mg/dL 9.3 9.3 9.0      Frederik Standley M. Lajuana Ripple, DO PGY-3, [facility]

## 2015-09-22 ENCOUNTER — Encounter: Payer: Self-pay | Admitting: Hematology

## 2015-09-22 ENCOUNTER — Telehealth: Payer: Self-pay | Admitting: Hematology

## 2015-09-22 NOTE — Telephone Encounter (Signed)
Pt's wife confirmed appt, completed intake, mailed new pt letter

## 2015-09-23 ENCOUNTER — Other Ambulatory Visit: Payer: PPO

## 2015-09-23 DIAGNOSIS — N179 Acute kidney failure, unspecified: Secondary | ICD-10-CM | POA: Diagnosis not present

## 2015-09-23 LAB — BASIC METABOLIC PANEL WITH GFR
BUN: 16 mg/dL (ref 7–25)
CO2: 21 mmol/L (ref 20–31)
Calcium: 9.3 mg/dL (ref 8.6–10.3)
Chloride: 104 mmol/L (ref 98–110)
Creat: 1.25 mg/dL — ABNORMAL HIGH (ref 0.70–1.11)
GFR, Est African American: 62 mL/min (ref >=60)
GFR, Est Non African American: 54 mL/min — ABNORMAL LOW (ref >=60)
Glucose, Bld: 128 mg/dL — ABNORMAL HIGH (ref 65–99)
Potassium: 4.5 mmol/L (ref 3.5–5.3)
Sodium: 138 mmol/L (ref 135–146)

## 2015-09-25 ENCOUNTER — Encounter: Payer: Self-pay | Admitting: Family Medicine

## 2015-09-28 ENCOUNTER — Other Ambulatory Visit: Payer: PPO

## 2015-09-28 ENCOUNTER — Telehealth: Payer: Self-pay | Admitting: *Deleted

## 2015-09-28 NOTE — Telephone Encounter (Signed)
I attempted to call patient re: results.  His Creatinine is slightly improved but still abnormal.  I recommend following up with PCP in next couple of weeks for recheck and possible further workup.  I sent a letter with this recommendation to patient as well last week.  Please relay this to patient.

## 2015-09-28 NOTE — Telephone Encounter (Signed)
LM with wife for pt to call. Please give him the information below from Dr. Lajuana Ripple. Ottis Stain, CMA

## 2015-09-28 NOTE — Telephone Encounter (Signed)
Pt wants to know results from the 19th. Please advise. Rea Reser Kennon Holter, CMA

## 2015-09-29 ENCOUNTER — Telehealth: Payer: Self-pay | Admitting: Hematology

## 2015-09-29 NOTE — Telephone Encounter (Signed)
Returned pt call regarding questions he had referring to his upcoming appt.

## 2015-10-01 ENCOUNTER — Telehealth: Payer: Self-pay | Admitting: Hematology

## 2015-10-01 NOTE — Telephone Encounter (Signed)
Returned pt call and left mess.

## 2015-10-15 ENCOUNTER — Other Ambulatory Visit (HOSPITAL_COMMUNITY)
Admission: RE | Admit: 2015-10-15 | Discharge: 2015-10-15 | Disposition: A | Discharge status: Home / Self Care | Payer: PPO | Source: Ambulatory Visit | Attending: Hematology | Admitting: Hematology

## 2015-10-15 ENCOUNTER — Ambulatory Visit (HOSPITAL_BASED_OUTPATIENT_CLINIC_OR_DEPARTMENT_OTHER): Payer: PPO

## 2015-10-15 ENCOUNTER — Encounter: Payer: Self-pay | Admitting: Hematology

## 2015-10-15 ENCOUNTER — Ambulatory Visit: Payer: PPO | Admitting: Hematology

## 2015-10-15 ENCOUNTER — Telehealth: Payer: Self-pay | Admitting: Hematology

## 2015-10-15 VITALS — BP 117/98 | HR 98 | Temp 97.7°F | Resp 18 | Wt 189.2 lb

## 2015-10-15 DIAGNOSIS — D72820 Lymphocytosis (symptomatic): Secondary | ICD-10-CM

## 2015-10-15 DIAGNOSIS — R05 Cough: Secondary | ICD-10-CM

## 2015-10-15 DIAGNOSIS — D72829 Elevated white blood cell count, unspecified: Secondary | ICD-10-CM | POA: Diagnosis not present

## 2015-10-15 DIAGNOSIS — E860 Dehydration: Secondary | ICD-10-CM | POA: Diagnosis not present

## 2015-10-15 DIAGNOSIS — R059 Cough, unspecified: Secondary | ICD-10-CM

## 2015-10-15 LAB — CBC & DIFF AND RETIC
BASO%: 0.3 % (ref 0.0–2.0)
Basophils Absolute: 0 10*3/uL (ref 0.0–0.1)
EOS%: 4.3 % (ref 0.0–7.0)
Eosinophils Absolute: 0.6 10*3/uL — ABNORMAL HIGH (ref 0.0–0.5)
HCT: 40.3 % (ref 38.4–49.9)
HGB: 14.1 g/dL (ref 13.0–17.1)
Immature Retic Fract: 9 % (ref 3.00–10.60)
LYMPH%: 30.7 % (ref 14.0–49.0)
MCH: 27.2 pg (ref 27.2–33.4)
MCHC: 35 g/dL (ref 32.0–36.0)
MCV: 77.6 fL — ABNORMAL LOW (ref 79.3–98.0)
MONO#: 0.9 10*3/uL (ref 0.1–0.9)
MONO%: 6.8 % (ref 0.0–14.0)
NEUT#: 7.3 10*3/uL — ABNORMAL HIGH (ref 1.5–6.5)
NEUT%: 57.9 % (ref 39.0–75.0)
Platelets: 198 10*3/uL (ref 140–400)
RBC: 5.19 10*6/uL (ref 4.20–5.82)
RDW: 15.4 % — ABNORMAL HIGH (ref 11.0–14.6)
Retic %: 1.22 % (ref 0.80–1.80)
Retic Ct Abs: 63.32 10*3/uL (ref 34.80–93.90)
WBC: 12.7 10*3/uL — ABNORMAL HIGH (ref 4.0–10.3)
lymph#: 3.9 10*3/uL — ABNORMAL HIGH (ref 0.9–3.3)

## 2015-10-15 LAB — COMPREHENSIVE METABOLIC PANEL
ALT: 17 U/L (ref 0–55)
AST: 17 U/L (ref 5–34)
Albumin: 4.1 g/dL (ref 3.5–5.0)
Alkaline Phosphatase: 75 U/L (ref 40–150)
Anion Gap: 9 mEq/L (ref 3–11)
BUN: 29.2 mg/dL — ABNORMAL HIGH (ref 7.0–26.0)
CO2: 24 mEq/L (ref 22–29)
Calcium: 10.2 mg/dL (ref 8.4–10.4)
Chloride: 110 mEq/L — ABNORMAL HIGH (ref 98–109)
Creatinine: 1.6 mg/dL — ABNORMAL HIGH (ref 0.7–1.3)
EGFR: 46 mL/min/{1.73_m2} — ABNORMAL LOW (ref >90)
Glucose: 95 mg/dl (ref 70–140)
Potassium: 4.5 mEq/L (ref 3.5–5.1)
Sodium: 142 mEq/L (ref 136–145)
Total Bilirubin: 0.47 mg/dL (ref 0.20–1.20)
Total Protein: 7.7 g/dL (ref 6.4–8.3)

## 2015-10-15 LAB — CHCC SMEAR

## 2015-10-15 MED ORDER — BENZONATATE 100 MG PO CAPS: 100.0000 mg | ORAL_CAPSULE | Freq: Three times a day (TID) | ORAL | 0 refills | Status: DC | PRN

## 2015-10-15 NOTE — Progress Notes (Signed)
Marland Kitchen    HEMATOLOGY/ONCOLOGY CONSULTATION NOTE  Date of Service: 10/15/2015  Patient Care Team: Elberta Leatherwood, MD as PCP - General (Family Medicine)  CHIEF COMPLAINTS/PURPOSE OF CONSULTATION:  Leucocytosis  HISTORY OF PRESENTING ILLNESS:   [name] is a wonderful 82 y.o. male who has been referred to [place] by Dr .Alease Frame, Marylynn Pearson, MD for evaluation and management of leucocytosis.  Patient has a history of iron deficiency anemia, hypertension, diabetes who was recently seen by his primary care physician and had a CBC on 09/16/2015 which showed leukocytosis with a WBC count of 12.3k no WBC differential done. His hemoglobin levels was normal at 13.9 with an MCV of 79 and a normal platelet count of 192k  Review of labs suggest he has had persistent leukocytosis since at least July 2013. WBC count of varying between 11k to 13.5k with mild lymphocytosis in the 4k range and some intermittent mild borderline eosinophilia.  Patient presented to his primary care physician with fatigue for 3-4 weeks and deep cough for about 2 months with some dyspnea on exertion. Patient has had a history of iron deficiency anemia and reports having a colonoscopy in 2010 which was negative.  Patient notes that he works in the [employer] and does get exposed to [employer]  Does not note any significant weight loss.  No recent use of steroids/prednisone.  He was a former smoker and quit smoking in 2013.  Has been in allopurinol for reported gout. This can sometimes cause hypersensitivity reactions with leukocytosis and eosinophilia.  No fevers no chills no night sweats no acute weight loss..  No other specific focal symptoms.   MEDICAL HISTORY:  Past Medical History:  Diagnosis Date  . ANEMIA, IRON DEF, NOS 01/15/2007   Qualifier: Diagnosis of  By: Jeannine Kitten MD, Rodman Key    . Arthritis   . CHOLECYSTECTOMY, LAPAROSCOPIC, HX OF 04/12/2007   Qualifier: Diagnosis of  By: Netty Starring  MD, Lucianne Muss    .  Hypertension     SURGICAL HISTORY: Past Surgical History:  Procedure Laterality Date  . CHOLECYSTECTOMY      SOCIAL HISTORY: Social History   Social History  . Marital status: Married    Spouse name: Enid Derry  . Number of children: 101  . Years of education: 12   Occupational History  . RetiredAnimal nutritionist Work    Social History Main Topics  . Smoking status: Former Smoker    Packs/day: 0.50    Types: Cigars    Quit date: 06/08/2011  . Smokeless tobacco: Never Used  . Alcohol use No  . Drug use: No  . Sexual activity: No   Other Topics Concern  . Not on file   Social History Narrative   Health Care POA:    Emergency Contact: wife, Hiran Leard, [phone]   End of Life Plan:    Who lives with you: wife, step daughter, granddaughter   Any pets: none   Diet: Patient has a varied diet and reports eating bacon and eggs every morning.   Exercise: Pt does water aerobic 2x a week.    Seatbelts: Pt reports wearing seatbelt when in vehicle.   Hobbies: none                   FAMILY HISTORY: Family History  Problem Relation Age of Onset  . Heart disease Brother   . Stroke Brother   . Diabetes Brother   . Cancer Brother     colon    ALLERGIES:  has No Known Allergies.  MEDICATIONS:  Current Outpatient Prescriptions  Medication Sig Dispense Refill  . acetaminophen (TYLENOL) 325 MG tablet Take 2 tablets (650 mg total) by mouth every 6 (six) hours as needed for moderate pain. 60 tablet 1  . ADVAIR DISKUS 500-50 MCG/DOSE AEPB INHALE 1 PUFF INTO THE LUNGS 2 TIMES DAILY 60 each 3  . albuterol (PROVENTIL HFA;VENTOLIN HFA) 108 (90 BASE) MCG/ACT inhaler Inhale 2 puffs into the lungs every 6 (six) hours as needed for wheezing or shortness of breath. 1 Inhaler 3  . allopurinol (ZYLOPRIM) 300 MG tablet TAKE 1 TABLET EVERY DAY 30 tablet 3  . glucose blood (ONE TOUCH ULTRA TEST) test strip Use as instructed.  Dispense:   Quantity sufficient for one month supply of once  daily testing. 100 each 12  . Lancets (ONETOUCH ULTRASOFT) lancets Use as instructed.  Dispense Quantity Sufficient for one month supply of once daily testing. 100 each 12  . losartan (COZAAR) 50 MG tablet TAKE 1 TABLET (50 MG TOTAL) BY MOUTH DAILY. 90 tablet 3  . SPIRIVA HANDIHALER 18 MCG inhalation capsule PLACE 1 CAPSULE (18 MCG TOTAL) INTO INHALER AND INHALE DAILY. 30 capsule 6   No current facility-administered medications for this visit.     REVIEW OF SYSTEMS:    10 Point review of Systems was done is negative except as noted above.  PHYSICAL EXAMINATION: ECOG PERFORMANCE STATUS: 1 - Symptomatic but completely ambulatory  . Vitals:   10/15/15 1056  BP: (!) 117/98  Pulse: 98  Resp: 18  Temp: 97.7 F (36.5 C)   Filed Weights   10/15/15 1056  Weight: 189 lb 3.2 oz (85.8 kg)   .Body mass index is 26.39 kg/m.  GENERAL:alert, in no acute distress and comfortable SKIN: skin color, texture, turgor are normal, no rashes or significant lesions EYES: normal, conjunctiva are pink and non-injected, sclera clear OROPHARYNX:no exudate, no erythema and lips, buccal mucosa, and tongue normal  NECK: supple, no JVD, thyroid normal size, non-tender, without nodularity LYMPH:  no palpable lymphadenopathy in the cervical, axillary or inguinal LUNGS: clear to auscultation with normal respiratory effort, no overt rales or rhonchi.  HEART: regular rate & rhythm,  no murmurs and no lower extremity edema ABDOMEN: abdomen soft, non-tender, normoactive bowel sounds  Musculoskeletal: no cyanosis of digits and no clubbing  PSYCH: alert & oriented x 3 with fluent speech NEURO: no focal motor/sensory deficits  LABORATORY DATA:  I have reviewed the data as listed  . CBC Latest Ref Rng & Units 09/16/2015 02/17/2014 07/09/2013  WBC 3.8 - 10.8 K/uL 12.3(H) 11.1(H) 11.6(H)  Hemoglobin 13.2 - 17.1 g/dL 13.9 13.3 14.6  Hematocrit 38.5 - 50.0 % 41.3 39.0 42.3  Platelets 140 - 400 K/uL 192 182 181    . CBC    Component Value Date/Time   WBC 12.3 (H) 09/16/2015 1055   RBC 5.20 09/16/2015 1055   HGB 13.9 09/16/2015 1055   HCT 41.3 09/16/2015 1055   PLT 192 09/16/2015 1055   MCV 79.4 (L) 09/16/2015 1055   MCH 26.7 (L) 09/16/2015 1055   MCHC 33.7 09/16/2015 1055   RDW 16.0 (H) 09/16/2015 1055   LYMPHSABS 4.1 (H) 07/09/2013 0922   MONOABS 0.8 07/09/2013 0922   EOSABS 0.6 07/09/2013 0922   BASOSABS 0.0 07/09/2013 0922    . CMP Latest Ref Rng & Units 09/23/2015 09/16/2015 02/25/2014  Glucose 65 - 99 mg/dL 128(H) 118(H) 166(H)  BUN 7 - 25 mg/dL _0 Creatinine  0.70 - 1.11 mg/dL 1.25(H) 1.30(H) 1.03  Sodium 135 - 146 mmol/L 138 137 138  Potassium 3.5 - 5.3 mmol/L 4.5 4.8 4.0  Chloride 98 - 110 mmol/L 104 105 105  CO2 20 - 31 mmol/L _0 Calcium 8.6 - 10.3 mg/dL 9.3 9.3 9.3  Total Protein 6.0 - 8.3 g/dL - - 6.9  Total Bilirubin 0.2 - 1.2 mg/dL - - 0.8  Alkaline Phos 39 - 117 U/L - - 217(H)  AST 0 - 37 U/L - - 22  ALT 0 - 53 U/L - - 63(H)   Component     Latest Ref Rng & Units 10/15/2015  WBC     4.0 - 10.3 10e3/uL 12.7 (H)  NEUT#     1.5 - 6.5 10e3/uL 7.3 (H)  Hemoglobin     13.0 - 17.1 g/dL 14.1  HCT     38.4 - 49.9 % 40.3  Platelets     140 - 400 10e3/uL 198  MCV     79.3 - 98.0 fL 77.6 (L)  MCH     27.2 - 33.4 pg 27.2  MCHC     32.0 - 36.0 g/dL 35.0  RBC     4.20 - 5.82 10e6/uL 5.19  RDW     11.0 - 14.6 % 15.4 (H)  lymph#     0.9 - 3.3 10e3/uL 3.9 (H)  MONO#     0.1 - 0.9 10e3/uL 0.9  Eosinophils Absolute     0.0 - 0.5 10e3/uL 0.6 (H)  Basophils Absolute     0.0 - 0.1 10e3/uL 0.0  NEUT%     39.0 - 75.0 % 57.9  LYMPH%     14.0 - 49.0 % 30.7  MONO%     0.0 - 14.0 % 6.8  EOS%     0.0 - 7.0 % 4.3  BASO%     0.0 - 2.0 % 0.3  Retic %     0.80 - 1.80 % 1.22  Retic Ct Abs     34.80 - 93.90 10e3/uL 63.32  Immature Retic Fract     3.00 - 10.60 % 9.00  Sodium     136 - 145 mEq/L 142  Potassium     3.5 - 5.1 mEq/L 4.5  Chloride     98  - 109 mEq/L 110 (H)  CO2     22 - 29 mEq/L 24  Glucose     70 - 140 mg/dl 95  BUN     7.0 - 26.0 mg/dL 29.2 (H)  Creatinine     0.7 - 1.3 mg/dL 1.6 (H)  Total Bilirubin     0.20 - 1.20 mg/dL 0.47  Alkaline Phosphatase     40 - 150 U/L 75  AST     5 - 34 U/L 17  ALT     0 - 55 U/L 17  Total Protein     6.4 - 8.3 g/dL 7.7  Albumin     3.5 - 5.0 g/dL 4.1  Calcium     8.4 - 10.4 mg/dL 10.2  Anion gap     3 - 11 mEq/L 9  EGFR     >90 ml/min/1.73 m2 46 (L)    RADIOGRAPHIC STUDIES: I have personally reviewed the radiological images as listed and agreed with the findings in the report. No results found.  ASSESSMENT & PLAN:   82 year old gentleman with   #1 Chronic leukocytosis with some borderline lymphocytosis This has been chronically and  nonprogressive and present since at least 2013. Patient was a smoker to 2013 and this can cause persistent leukocytosis/lymphocytosis even after quitting smoking.  #2 persistent deep cough. This has been evaluated by his primary care physician. Also notes significant fatigue and dyspnea on exertion. He notes exposure to cotton dust since he works in the [employer].  Plan -Repeat CBC shows mild stable leukocytosis with mild neutrophilia, borderline eosinophilia and leukocytosis. -We'll get a flow cytometry study to rule out clonal lymphocytosis -Patient was recommended to stay off the cigarettes. -We shall order a CT of the chest without contrast to evaluate for a chronic inflammatory lung disease . -He was recommended to use personal protective devices when present around dusts.  #3 dehydration with slight bump in creatinine to 1.6  -He was recommended to drink more water  and monitor this with his primary care physician since he is also on an ARB .  #4  microcytosis without significant anemia - likely has alpha thalassemia trait .  Labs today CT chest in 1-2 weeks RTC in 2-3 weeks   All of the patients questions were  answered with apparent satisfaction. The patient knows to call the clinic with any problems, questions or concerns.  I spent 45 minutes counseling the patient face to face. The total time spent in the appointment was 60 minutes and more than 50% was on counseling and direct patient cares.    Sullivan Lone MD [place] AAHIVMS [facility] Hematology/Oncology Physician [facility]  (Office):       [phone] (Work cell):  [phone] (Fax):           [phone]  10/15/2015 11:04 AM

## 2015-10-15 NOTE — Telephone Encounter (Signed)
per pof to sch pt appt-gave pt copy of avs °

## 2015-10-22 LAB — FLOW CYTOMETRY

## 2015-11-02 ENCOUNTER — Ambulatory Visit (HOSPITAL_COMMUNITY)
Admission: RE | Admit: 2015-11-02 | Discharge: 2015-11-02 | Disposition: A | Discharge status: Home / Self Care | Payer: PPO | Source: Ambulatory Visit | Attending: Hematology | Admitting: Hematology

## 2015-11-02 ENCOUNTER — Encounter (HOSPITAL_COMMUNITY): Payer: Self-pay

## 2015-11-02 DIAGNOSIS — D72820 Lymphocytosis (symptomatic): Secondary | ICD-10-CM

## 2015-11-02 DIAGNOSIS — R05 Cough: Secondary | ICD-10-CM | POA: Diagnosis not present

## 2015-11-02 DIAGNOSIS — I2510 Atherosclerotic heart disease of native coronary artery without angina pectoris: Secondary | ICD-10-CM | POA: Insufficient documentation

## 2015-11-02 DIAGNOSIS — R059 Cough, unspecified: Secondary | ICD-10-CM

## 2015-11-02 DIAGNOSIS — I700 Atherosclerosis of aorta: Secondary | ICD-10-CM | POA: Insufficient documentation

## 2015-11-02 DIAGNOSIS — J449 Chronic obstructive pulmonary disease, unspecified: Secondary | ICD-10-CM | POA: Insufficient documentation

## 2015-11-02 DIAGNOSIS — R918 Other nonspecific abnormal finding of lung field: Secondary | ICD-10-CM | POA: Diagnosis not present

## 2015-11-05 ENCOUNTER — Other Ambulatory Visit: Payer: Self-pay | Admitting: *Deleted

## 2015-11-06 ENCOUNTER — Ambulatory Visit (HOSPITAL_BASED_OUTPATIENT_CLINIC_OR_DEPARTMENT_OTHER): Payer: PPO | Admitting: Hematology

## 2015-11-06 ENCOUNTER — Other Ambulatory Visit (HOSPITAL_BASED_OUTPATIENT_CLINIC_OR_DEPARTMENT_OTHER): Payer: PPO

## 2015-11-06 ENCOUNTER — Encounter: Payer: Self-pay | Admitting: Hematology

## 2015-11-06 ENCOUNTER — Other Ambulatory Visit: Payer: Self-pay | Admitting: *Deleted

## 2015-11-06 VITALS — BP 129/93 | HR 80 | Temp 97.5°F | Resp 18 | Wt 191.9 lb

## 2015-11-06 DIAGNOSIS — D72820 Lymphocytosis (symptomatic): Secondary | ICD-10-CM | POA: Diagnosis not present

## 2015-11-06 DIAGNOSIS — R05 Cough: Secondary | ICD-10-CM

## 2015-11-06 DIAGNOSIS — D72829 Elevated white blood cell count, unspecified: Secondary | ICD-10-CM

## 2015-11-06 DIAGNOSIS — D7589 Other specified diseases of blood and blood-forming organs: Secondary | ICD-10-CM

## 2015-11-06 LAB — COMPREHENSIVE METABOLIC PANEL
ALT: 13 U/L (ref 0–55)
AST: 15 U/L (ref 5–34)
Albumin: 3.8 g/dL (ref 3.5–5.0)
Alkaline Phosphatase: 74 U/L (ref 40–150)
Anion Gap: 10 mEq/L (ref 3–11)
BUN: 23.5 mg/dL (ref 7.0–26.0)
CO2: 19 mEq/L — ABNORMAL LOW (ref 22–29)
Calcium: 9.4 mg/dL (ref 8.4–10.4)
Chloride: 109 mEq/L (ref 98–109)
Creatinine: 1.3 mg/dL (ref 0.7–1.3)
EGFR: 59 mL/min/{1.73_m2} — ABNORMAL LOW (ref >90)
Glucose: 116 mg/dl (ref 70–140)
Potassium: 4.2 mEq/L (ref 3.5–5.1)
Sodium: 138 mEq/L (ref 136–145)
Total Bilirubin: 0.53 mg/dL (ref 0.20–1.20)
Total Protein: 7.3 g/dL (ref 6.4–8.3)

## 2015-11-06 LAB — CBC & DIFF AND RETIC
BASO%: 0.4 % (ref 0.0–2.0)
Basophils Absolute: 0 10*3/uL (ref 0.0–0.1)
EOS%: 5.4 % (ref 0.0–7.0)
Eosinophils Absolute: 0.6 10*3/uL — ABNORMAL HIGH (ref 0.0–0.5)
HCT: 38.9 % (ref 38.4–49.9)
HGB: 13.6 g/dL (ref 13.0–17.1)
Immature Retic Fract: 18 % — ABNORMAL HIGH (ref 3.00–10.60)
LYMPH%: 30.8 % (ref 14.0–49.0)
MCH: 26.7 pg — ABNORMAL LOW (ref 27.2–33.4)
MCHC: 35 g/dL (ref 32.0–36.0)
MCV: 76.4 fL — ABNORMAL LOW (ref 79.3–98.0)
MONO#: 0.9 10*3/uL (ref 0.1–0.9)
MONO%: 8.2 % (ref 0.0–14.0)
NEUT#: 6.3 10*3/uL (ref 1.5–6.5)
NEUT%: 55.2 % (ref 39.0–75.0)
Platelets: 178 10*3/uL (ref 140–400)
RBC: 5.09 10*6/uL (ref 4.20–5.82)
RDW: 15.3 % — ABNORMAL HIGH (ref 11.0–14.6)
Retic %: 1.42 % (ref 0.80–1.80)
Retic Ct Abs: 72.28 10*3/uL (ref 34.80–93.90)
WBC: 11.4 10*3/uL — ABNORMAL HIGH (ref 4.0–10.3)
lymph#: 3.5 10*3/uL — ABNORMAL HIGH (ref 0.9–3.3)
nRBC: 0 % (ref 0–0)

## 2015-11-10 ENCOUNTER — Encounter: Payer: Self-pay | Admitting: *Deleted

## 2015-11-10 ENCOUNTER — Ambulatory Visit (INDEPENDENT_AMBULATORY_CARE_PROVIDER_SITE_OTHER): Payer: PPO | Admitting: *Deleted

## 2015-11-10 VITALS — BP 125/73 | HR 86 | Temp 97.6°F | Ht 71.0 in | Wt 189.8 lb

## 2015-11-10 DIAGNOSIS — Z0000 Encounter for general adult medical examination without abnormal findings: Secondary | ICD-10-CM | POA: Diagnosis not present

## 2015-11-10 NOTE — Progress Notes (Signed)
Subjective:   [name] is a 82 y.o. male who presents for Medicare Annual/Subsequent preventive examination.  Cardiac Risk Factors include: advanced age (>34men, >36 women);diabetes mellitus;dyslipidemia;hypertension;male gender;smoking/ tobacco exposure     Objective:    Vitals: BP 125/73 (BP Location: Left Arm, Patient Position: Sitting, Cuff Size: Normal)   Pulse 86   Temp 97.6 F (36.4 C) (Oral)   Ht 5\' 11"  (1.803 m)   Wt 189 lb 12.8 oz (86.1 kg)   SpO2 94%   BMI 26.47 kg/m   Body mass index is 26.47 kg/m.  Tobacco History  Smoking Status  . Former Smoker  . Packs/day: 0.50  . Years: 60.00  . Types: Cigars, Cigarettes  . Start date: [birth year]/08/1949  . Quit date: 06/08/2011  Smokeless Tobacco  . Never Used     Counseling given: Yes   Past Medical History:  Diagnosis Date  . ANEMIA, IRON DEF, NOS 01/15/2007   Qualifier: Diagnosis of  By: Jeannine Kitten MD, Rodman Key    . Arthritis   . Cataract extraction status of left eye 2016  . Cataract extraction status of right eye 2016  . CHOLECYSTECTOMY, LAPAROSCOPIC, HX OF 04/12/2007   Qualifier: Diagnosis of  By: Netty Starring  MD, Lucianne Muss    . Gout 2007  . Hypertension    Past Surgical History:  Procedure Laterality Date  . CHOLECYSTECTOMY     Family History  Problem Relation Age of Onset  . Asthma Mother   . Heart disease Brother   . Stroke Brother   . Diabetes Brother   . Cancer Brother     colon  . Arthritis Sister   . Arthritis Daughter   . Stroke Brother   . Heart disease Brother   . Alcohol abuse Sister   . Cirrhosis Sister   . Asthma Sister   . Diabetes Son   . Asthma Daughter    History  Sexual Activity  . Sexual activity: No    Outpatient Encounter Prescriptions as of 11/10/2015  Medication Sig  . acetaminophen (TYLENOL) 325 MG tablet Take 2 tablets (650 mg total) by mouth every 6 (six) hours as needed for moderate pain.  Marland Kitchen ADVAIR DISKUS 500-50 MCG/DOSE AEPB INHALE 1 PUFF INTO THE LUNGS 2 TIMES  DAILY  . albuterol (PROVENTIL HFA;VENTOLIN HFA) 108 (90 BASE) MCG/ACT inhaler Inhale 2 puffs into the lungs every 6 (six) hours as needed for wheezing or shortness of breath.  . allopurinol (ZYLOPRIM) 300 MG tablet TAKE 1 TABLET EVERY DAY  . losartan (COZAAR) 50 MG tablet TAKE 1 TABLET (50 MG TOTAL) BY MOUTH DAILY.  Marland Kitchen SPIRIVA HANDIHALER 18 MCG inhalation capsule PLACE 1 CAPSULE (18 MCG TOTAL) INTO INHALER AND INHALE DAILY.  . [DISCONTINUED] benzonatate (TESSALON) 100 MG capsule Take 1 capsule (100 mg total) by mouth 3 (three) times daily as needed for cough.  Marland Kitchen glucose blood (ONE TOUCH ULTRA TEST) test strip Use as instructed.  Dispense:   Quantity sufficient for one month supply of once daily testing. (Patient not taking: Reported on 11/10/2015)  . Lancets (ONETOUCH ULTRASOFT) lancets Use as instructed.  Dispense Quantity Sufficient for one month supply of once daily testing. (Patient not taking: Reported on 11/10/2015)   No facility-administered encounter medications on file as of 11/10/2015.     Activities of Daily Living In your present state of health, do you have any difficulty performing the following activities: 11/10/2015  Hearing? Y  Vision? N  Difficulty concentrating or making decisions? Y  Walking or  climbing stairs? Y  Dressing or bathing? N  Doing errands, shopping? N  Preparing Food and eating ? N  Using the Toilet? N  In the past six months, have you accidently leaked urine? N  Do you have problems with loss of bowel control? N  Managing your Medications? N  Managing your Finances? N  Housekeeping or managing your Housekeeping? N  Some recent data might be hidden  Home Safety:  My home has a working smoke alarm:  Yes X 2           My home throw rugs have been fastened down to the floor or removed:  No, will fasten I have non-slip mats in the bathtub and shower:  No. Discussed applying non-slip strips to bathtub or laying bath mat         All my home's stairs have railings or  bannisters: Yes         My home's floors, stairs and hallways are free from clutter, wires and cords:  Yes        Patient Care Team: Elberta Leatherwood, MD as PCP - General (Family Medicine) Sullivan Lone as referral for cough   Assessment:     Exercise Activities and Dietary recommendations Current Exercise Habits: Home exercise routine, Time (Minutes): 60, Frequency (Times/Week): 4, Weekly Exercise (Minutes/Week): 240, Intensity: Moderate, Exercise limited by: orthopedic condition(s);respiratory conditions(s)  Goals    . Eat more fruits and vegetables    . Exercise 1x per week (30 min per time)    . HEMOGLOBIN A1C < 7.0     LDL < 100  Fall Risk Fall Risk  11/10/2015 06/09/2015 01/26/2015 12/12/2014 05/27/2014  Falls in the past year? No No No No No  Number falls in past yr: - - - - -  Injury with Fall? - - - - -  Risk for fall due to : - - - - -  Risk for fall due to (comments): - - - - -   Depression Screen PHQ 2/9 Scores 11/10/2015 09/16/2015 06/09/2015 01/26/2015  PHQ - 2 Score 0 0 0 0  PHQ- 9 Score - - - -    Cognitive Testing MMSE - Mini Mental State Exam 12/24/2012 10/11/2011 09/13/2010  Orientation to time 5 5 5   Orientation to Place 5 5 5   Registration 3 3 3   Attention/ Calculation 5 3 5   Recall 3 3 3   Language- name 2 objects 2 2 2   Language- repeat 1 1 1   Language- follow 3 step command 3 3 3   Language- read & follow direction 1 1 1   Write a sentence 0 1 1  Copy design 1 1 1   Total score 29 28 30    Mini-Cog passed with score 4/5  TUG Test:  Done in 12 seconds. Patient used both hands to push out of chair and to sit back down. Falls prevention discussed and literature given.   Immunization History  Administered Date(s) Administered  . Influenza Split 12/29/2010, 12/06/2011  . Influenza Whole 01/16/2009  . Influenza,inj,Quad PF,36+ Mos 12/24/2012, 12/16/2013, 12/12/2014  . Pneumococcal Conjugate-13 10/24/2007  . Pneumococcal Polysaccharide-23 07/11/2011  . Tdap  07/23/2008   Screening Tests Health Maintenance  Topic Date Due  . FOOT EXAM  06/09/2015  . OPHTHALMOLOGY EXAM  08/06/2015  . INFLUENZA VACCINE  10/06/2015  . HEMOGLOBIN A1C  03/18/2016  . TETANUS/TDAP  07/24/2018  . COLONOSCOPY  10/04/2018  . ZOSTAVAX  Addressed  . PNA vac Low Risk Adult  Completed  Foot exam done today Patient refuses Flu and zostavax vaccines Patient will make appt with eye doctor for eye exam. Will see doctor who performed bilateral cataract removal in 2016  Diabetic Foot Exam - Simple   Simple Foot Form Diabetic Foot exam was performed with the following findings:  Yes 11/10/2015 11:40 AM  Visual Inspection No deformities, no ulcerations, no other skin breakdown bilaterally:  Yes Sensation Testing See comments:  Yes Pulse Check Posterior Tibialis and Dorsalis pulse intact bilaterally:  Yes Comments Patient had no sensation to monofilament testing at left 5th phalange and right 2nd phalange. All others were intact. Velora Heckler, RN        Plan:   Instructed patient to schedule appt with PCP to discuss one week hx of left knee pain extending to left foot. Patient thinks it's related to "slipped disk" from 4-5 years ago.  During the course of the visit the patient was educated and counseled about the following appropriate screening and preventive services:   Vaccines to include Pneumoccal, Influenza, Td, Zostavax  Cardiovascular Disease  Colorectal cancer screening  Diabetes screening  Glaucoma screening  Nutrition counseling    Patient Instructions (the written plan) was given to the patient.    Velora Heckler, RN  11/10/2015    Addendum: I have reviewed this visit and discussed with Howell Rucks, RN, BSN, and agree with her documentation.   Elberta Leatherwood, MD,MS,  PGY3 11/12/2015 11:05 AM

## 2015-11-10 NOTE — Patient Instructions (Signed)
Fat and Cholesterol Restricted Diet High levels of fat and cholesterol in your blood may lead to various health problems, such as diseases of the heart, blood vessels, gallbladder, liver, and pancreas. Fats are concentrated sources of energy that come in various forms. Certain types of fat, including saturated fat, may be harmful in excess. Cholesterol is a substance needed by your body in small amounts. Your body makes all the cholesterol it needs. Excess cholesterol comes from the food you eat. When you have high levels of cholesterol and saturated fat in your blood, health problems can develop because the excess fat and cholesterol will gather along the walls of your blood vessels, causing them to narrow. Choosing the right foods will help you control your intake of fat and cholesterol. This will help keep the levels of these substances in your blood within normal limits and reduce your risk of disease. WHAT IS MY PLAN? Your health care provider recommends that you:  Get no more than __________ % of the total calories in your daily diet from fat.  Limit your intake of saturated fat to less than ______% of your total calories each day.  Limit the amount of cholesterol in your diet to less than _________mg per day. WHAT TYPES OF FAT SHOULD I CHOOSE?  Choose healthy fats more often. Choose monounsaturated and polyunsaturated fats, such as olive and canola oil, flaxseeds, walnuts, almonds, and seeds.  Eat more omega-3 fats. Good choices include salmon, mackerel, sardines, tuna, flaxseed oil, and ground flaxseeds. Aim to eat fish at least two times a week.  Limit saturated fats. Saturated fats are primarily found in animal products, such as meats, butter, and cream. Plant sources of saturated fats include palm oil, palm kernel oil, and coconut oil.  Avoid foods with partially hydrogenated oils in them. These contain trans fats. Examples of foods that contain trans fats are stick margarine, some  tub margarines, cookies, crackers, and other baked goods. WHAT GENERAL GUIDELINES DO I NEED TO FOLLOW? These guidelines for healthy eating will help you control your intake of fat and cholesterol:  Check food labels carefully to identify foods with trans fats or high amounts of saturated fat.  Fill one half of your plate with vegetables and green salads.  Fill one fourth of your plate with whole grains. Look for the word "whole" as the first word in the ingredient list.  Fill one fourth of your plate with lean protein foods.  Limit fruit to two servings a day. Choose fruit instead of juice.  Eat more foods that contain soluble fiber. Examples of foods that contain this type of fiber are apples, broccoli, carrots, beans, peas, and barley. Aim to get 20-30 g of fiber per day.  Eat more home-cooked food and less restaurant, buffet, and fast food.  Limit or avoid alcohol.  Limit foods high in starch and sugar.  Limit fried foods.  Cook foods using methods other than frying. Baking, boiling, grilling, and broiling are all great options.  Lose weight if you are overweight. Losing just 5-10% of your initial body weight can help your overall health and prevent diseases such as diabetes and heart disease. WHAT FOODS CAN I EAT? Grains Whole grains, such as whole wheat or whole grain breads, crackers, cereals, and pasta. Unsweetened oatmeal, bulgur, barley, quinoa, or brown rice. Corn or whole wheat flour tortillas. Vegetables Fresh or frozen vegetables (raw, steamed, roasted, or grilled). Green salads. Fruits All fresh, canned (in natural juice), or frozen fruits. Meat and  Other Protein Products Ground beef (85% or leaner), grass-fed beef, or beef trimmed of fat. Skinless chicken or [place]. Ground chicken or [place]. Pork trimmed of fat. All fish and seafood. Eggs. Dried beans, peas, or lentils. Unsalted nuts or seeds. Unsalted canned or dry beans. Dairy Low-fat dairy products, such as skim  or 1% milk, 2% or reduced-fat cheeses, low-fat ricotta or cottage cheese, or plain low-fat yogurt. Fats and Oils Tub margarines without trans fats. Light or reduced-fat mayonnaise and salad dressings. Avocado. Olive, canola, sesame, or safflower oils. Natural peanut or almond butter (choose ones without added sugar and oil). The items listed above may not be a complete list of recommended foods or beverages. Contact your dietitian for more options. WHAT FOODS ARE NOT RECOMMENDED? Grains White bread. White pasta. White rice. Cornbread. Bagels, pastries, and croissants. Crackers that contain trans fat. Vegetables White potatoes. Corn. Creamed or fried vegetables. Vegetables in a cheese sauce. Fruits Dried fruits. Canned fruit in light or heavy syrup. Fruit juice. Meat and Other Protein Products Fatty cuts of meat. Ribs, chicken wings, bacon, sausage, bologna, salami, chitterlings, fatback, hot dogs, bratwurst, and packaged luncheon meats. Liver and organ meats. Dairy Whole or 2% milk, cream, half-and-half, and cream cheese. Whole milk cheeses. Whole-fat or sweetened yogurt. Full-fat cheeses. Nondairy creamers and whipped toppings. Processed cheese, cheese spreads, or cheese curds. Sweets and Desserts Corn syrup, sugars, honey, and molasses. Candy. Jam and jelly. Syrup. Sweetened cereals. Cookies, pies, cakes, donuts, muffins, and ice cream. Fats and Oils Butter, stick margarine, lard, shortening, ghee, or bacon fat. Coconut, palm kernel, or palm oils. Beverages Alcohol. Sweetened drinks (such as sodas, lemonade, and fruit drinks or punches). The items listed above may not be a complete list of foods and beverages to avoid. Contact your dietitian for more information.   This information is not intended to replace advice given to you by your health care provider. Make sure you discuss any questions you have with your health care provider.   Document Released: 02/21/2005 Document Revised:  03/14/2014 Document Reviewed: 05/22/2013 Elsevier Interactive Patient Education 2016 [place].  Diabetes and Foot Care Diabetes may cause you to have problems because of poor blood supply (circulation) to your feet and legs. This may cause the skin on your feet to become thinner, break easier, and heal more slowly. Your skin may become dry, and the skin may peel and crack. You may also have nerve damage in your legs and feet causing decreased feeling in them. You may not notice minor injuries to your feet that could lead to infections or more serious problems. Taking care of your feet is one of the most important things you can do for yourself.  HOME CARE INSTRUCTIONS  Wear shoes at all times, even in the house. Do not go barefoot. Bare feet are easily injured.  Check your feet daily for blisters, cuts, and redness. If you cannot see the bottom of your feet, use a mirror or ask someone for help.  Wash your feet with warm water (do not use hot water) and mild soap. Then pat your feet and the areas between your toes until they are completely dry. Do not soak your feet as this can dry your skin.  Apply a moisturizing lotion or petroleum jelly (that does not contain alcohol and is unscented) to the skin on your feet and to dry, brittle toenails. Do not apply lotion between your toes.  Trim your toenails straight across. Do not dig under them or around  the cuticle. File the edges of your nails with an emery board or nail file.  Do not cut corns or calluses or try to remove them with medicine.  Wear clean socks or stockings every day. Make sure they are not too tight. Do not wear knee-high stockings since they may decrease blood flow to your legs.  Wear shoes that fit properly and have enough cushioning. To break in new shoes, wear them for just a few hours a day. This prevents you from injuring your feet. Always look in your shoes before you put them on to be sure there are no objects  inside.  Do not cross your legs. This may decrease the blood flow to your feet.  If you find a minor scrape, cut, or break in the skin on your feet, keep it and the skin around it clean and dry. These areas may be cleansed with mild soap and water. Do not cleanse the area with peroxide, alcohol, or iodine.  When you remove an adhesive bandage, be sure not to damage the skin around it.  If you have a wound, look at it several times a day to make sure it is healing.  Do not use heating pads or hot water bottles. They may burn your skin. If you have lost feeling in your feet or legs, you may not know it is happening until it is too late.  Make sure your health care provider performs a complete foot exam at least annually or more often if you have foot problems. Report any cuts, sores, or bruises to your health care provider immediately. SEEK MEDICAL CARE IF:   You have an injury that is not healing.  You have cuts or breaks in the skin.  You have an ingrown nail.  You notice redness on your legs or feet.  You feel burning or tingling in your legs or feet.  You have pain or cramps in your legs and feet.  Your legs or feet are numb.  Your feet always feel cold. SEEK IMMEDIATE MEDICAL CARE IF:   There is increasing redness, swelling, or pain in or around a wound.  There is a red line that goes up your leg.  Pus is coming from a wound.  You develop a fever or as directed by your health care provider.  You notice a bad smell coming from an ulcer or wound.   This information is not intended to replace advice given to you by your health care provider. Make sure you discuss any questions you have with your health care provider.   Document Released: 02/19/2000 Document Revised: 10/24/2012 Document Reviewed: 07/31/2012 Elsevier Interactive Patient Education 2016 [place] in the Home  Falls can cause injuries. They can happen to people of all ages. There are  many things you can do to make your home safe and to help prevent falls.  WHAT CAN I DO ON THE OUTSIDE OF MY HOME?  Regularly fix the edges of walkways and driveways and fix any cracks.  Remove anything that might make you trip as you walk through a door, such as a raised step or threshold.  Trim any bushes or trees on the path to your home.  Use bright outdoor lighting.  Clear any walking paths of anything that might make someone trip, such as rocks or tools.  Regularly check to see if handrails are loose or broken. Make sure that both sides of any steps have handrails.  Any  raised decks and porches should have guardrails on the edges.  Have any leaves, snow, or ice cleared regularly.  Use sand or salt on walking paths during winter.  Clean up any spills in your garage right away. This includes oil or grease spills. WHAT CAN I DO IN THE BATHROOM?   Use night lights.  Install grab bars by the toilet and in the tub and shower. Do not use towel bars as grab bars.  Use non-skid mats or decals in the tub or shower.  If you need to sit down in the shower, use a plastic, non-slip stool.  Keep the floor dry. Clean up any water that spills on the floor as soon as it happens.  Remove soap buildup in the tub or shower regularly.  Attach bath mats securely with double-sided non-slip rug tape.  Do not have throw rugs and other things on the floor that can make you trip. WHAT CAN I DO IN THE BEDROOM?  Use night lights.  Make sure that you have a light by your bed that is easy to reach.  Do not use any sheets or blankets that are too big for your bed. They should not hang down onto the floor.  Have a firm chair that has side arms. You can use this for support while you get dressed.  Do not have throw rugs and other things on the floor that can make you trip. WHAT CAN I DO IN THE KITCHEN?  Clean up any spills right away.  Avoid walking on wet floors.  Keep items that you use  a lot in easy-to-reach places.  If you need to reach something above you, use a strong step stool that has a grab bar.  Keep electrical cords out of the way.  Do not use floor polish or wax that makes floors slippery. If you must use wax, use non-skid floor wax.  Do not have throw rugs and other things on the floor that can make you trip. WHAT CAN I DO WITH MY STAIRS?  Do not leave any items on the stairs.  Make sure that there are handrails on both sides of the stairs and use them. Fix handrails that are broken or loose. Make sure that handrails are as long as the stairways.  Check any carpeting to make sure that it is firmly attached to the stairs. Fix any carpet that is loose or worn.  Avoid having throw rugs at the top or bottom of the stairs. If you do have throw rugs, attach them to the floor with carpet tape.  Make sure that you have a light switch at the top of the stairs and the bottom of the stairs. If you do not have them, ask someone to add them for you. WHAT ELSE CAN I DO TO HELP PREVENT FALLS?  Wear shoes that:  Do not have high heels.  Have rubber bottoms.  Are comfortable and fit you well.  Are closed at the toe. Do not wear sandals.  If you use a stepladder:  Make sure that it is fully opened. Do not climb a closed stepladder.  Make sure that both sides of the stepladder are locked into place.  Ask someone to hold it for you, if possible.  Clearly mark and make sure that you can see:  Any grab bars or handrails.  First and last steps.  Where the edge of each step is.  Use tools that help you move around (mobility aids) if they  are needed. These include:  Canes.  Walkers.  Scooters.  Crutches.  Turn on the lights when you go into a dark area. Replace any light bulbs as soon as they burn out.  Set up your furniture so you have a clear path. Avoid moving your furniture around.  If any of your floors are uneven, fix them.  If there are any  pets around you, be aware of where they are.  Review your medicines with your doctor. Some medicines can make you feel dizzy. This can increase your chance of falling. Ask your doctor what other things that you can do to help prevent falls.   This information is not intended to replace advice given to you by your health care provider. Make sure you discuss any questions you have with your health care provider.   Document Released: 12/18/2008 Document Revised: 07/08/2014 Document Reviewed: 03/28/2014 Elsevier Interactive Patient Education 2016 [place] Maintenance, Male A healthy lifestyle and preventative care can promote health and wellness.  Maintain regular health, dental, and eye exams.  Eat a healthy diet. Foods like vegetables, fruits, whole grains, low-fat dairy products, and lean protein foods contain the nutrients you need and are low in calories. Decrease your intake of foods high in solid fats, added sugars, and salt. Get information about a proper diet from your health care provider, if necessary.  Regular physical exercise is one of the most important things you can do for your health. Most adults should get at least 150 minutes of moderate-intensity exercise (any activity that increases your heart rate and causes you to sweat) each week. In addition, most adults need muscle-strengthening exercises on 2 or more days a week.   Maintain a healthy weight. The body mass index (BMI) is a screening tool to identify possible weight problems. It provides an estimate of body fat based on height and weight. Your health care provider can find your BMI and can help you achieve or maintain a healthy weight. For males 20 years and older:  A BMI below 18.5 is considered underweight.  A BMI of 18.5 to 24.9 is normal.  A BMI of 25 to 29.9 is considered overweight.  A BMI of 30 and above is considered obese.  Maintain normal blood lipids and cholesterol by exercising and  minimizing your intake of saturated fat. Eat a balanced diet with plenty of fruits and vegetables. Blood tests for lipids and cholesterol should begin at age 13 and be repeated every 5 years. If your lipid or cholesterol levels are high, you are over age 69, or you are at high risk for heart disease, you may need your cholesterol levels checked more frequently.Ongoing high lipid and cholesterol levels should be treated with medicines if diet and exercise are not working.  If you smoke, find out from your health care provider how to quit. If you do not use tobacco, do not start.  Lung cancer screening is recommended for adults aged 27-80 years who are at high risk for developing lung cancer because of a history of smoking. A yearly low-dose CT scan of the lungs is recommended for people who have at least a 30-pack-year history of smoking and are current smokers or have quit within the past 15 years. A pack year of smoking is smoking an average of 1 pack of cigarettes a day for 1 year (for example, a 30-pack-year history of smoking could mean smoking 1 pack a day for 30 years or 2 packs a day  for 15 years). Yearly screening should continue until the smoker has stopped smoking for at least 15 years. Yearly screening should be stopped for people who develop a health problem that would prevent them from having lung cancer treatment.  If you choose to drink alcohol, do not have more than 2 drinks per day. One drink is considered to be 12 oz (360 mL) of beer, 5 oz (150 mL) of wine, or 1.5 oz (45 mL) of liquor.  Avoid the use of street drugs. Do not share needles with anyone. Ask for help if you need support or instructions about stopping the use of drugs.  High blood pressure causes heart disease and increases the risk of stroke. High blood pressure is more likely to develop in:  People who have blood pressure in the end of the normal range (100-139/85-89 mm Hg).  People who are overweight or  obese.  People who are African American.  If you are 107-7 years of age, have your blood pressure checked every 3-5 years. If you are 39 years of age or older, have your blood pressure checked every year. You should have your blood pressure measured twice--once when you are at a hospital or clinic, and once when you are not at a hospital or clinic. Record the average of the two measurements. To check your blood pressure when you are not at a hospital or clinic, you can use:  An automated blood pressure machine at a pharmacy.  A home blood pressure monitor.  If you are 52-76 years old, ask your health care provider if you should take aspirin to prevent heart disease.  Diabetes screening involves taking a blood sample to check your fasting blood sugar level. This should be done once every 3 years after age 51 if you are at a normal weight and without risk factors for diabetes. Testing should be considered at a younger age or be carried out more frequently if you are overweight and have at least 1 risk factor for diabetes.  Colorectal cancer can be detected and often prevented. Most routine colorectal cancer screening begins at the age of 75 and continues through age 62. However, your health care provider may recommend screening at an earlier age if you have risk factors for colon cancer. On a yearly basis, your health care provider may provide home test kits to check for hidden blood in the stool. A small camera at the end of a tube may be used to directly examine the colon (sigmoidoscopy or colonoscopy) to detect the earliest forms of colorectal cancer. Talk to your health care provider about this at age 33 when routine screening begins. A direct exam of the colon should be repeated every 5-10 years through age 58, unless early forms of precancerous polyps or small growths are found.  People who are at an increased risk for hepatitis B should be screened for this virus. You are considered at high risk  for hepatitis B if:  You were born in a country where hepatitis B occurs often. Talk with your health care provider about which countries are considered high risk.  Your parents were born in a high-risk country and you have not received a shot to protect against hepatitis B (hepatitis B vaccine).  You have HIV or AIDS.  You use needles to inject street drugs.  You live with, or have sex with, someone who has hepatitis B.  You are a man who has sex with other men (MSM).  You get hemodialysis  treatment.  You take certain medicines for conditions like cancer, organ transplantation, and autoimmune conditions.  Hepatitis C blood testing is recommended for all people born from [birth year] through 1963 and any individual with known risk factors for hepatitis C.  Healthy men should no longer receive prostate-specific antigen (PSA) blood tests as part of routine cancer screening. Talk to your health care provider about prostate cancer screening.  Testicular cancer screening is not recommended for adolescents or adult males who have no symptoms. Screening includes self-exam, a health care provider exam, and other screening tests. Consult with your health care provider about any symptoms you have or any concerns you have about testicular cancer.  Practice safe sex. Use condoms and avoid high-risk sexual practices to reduce the spread of sexually transmitted infections (STIs).  You should be screened for STIs, including gonorrhea and chlamydia if:  You are sexually active and are younger than 24 years.  You are older than 24 years, and your health care provider tells you that you are at risk for this type of infection.  Your sexual activity has changed since you were last screened, and you are at an increased risk for chlamydia or gonorrhea. Ask your health care provider if you are at risk.  If you are at risk of being infected with HIV, it is recommended that you take a prescription medicine daily to  prevent HIV infection. This is called pre-exposure prophylaxis (PrEP). You are considered at risk if:  You are a man who has sex with other men (MSM).  You are a heterosexual man who is sexually active with multiple partners.  You take drugs by injection.  You are sexually active with a partner who has HIV.  Talk with your health care provider about whether you are at high risk of being infected with HIV. If you choose to begin PrEP, you should first be tested for HIV. You should then be tested every 3 months for as long as you are taking PrEP.  Use sunscreen. Apply sunscreen liberally and repeatedly throughout the day. You should seek shade when your shadow is shorter than you. Protect yourself by wearing long sleeves, pants, a wide-brimmed hat, and sunglasses year round whenever you are outdoors.  Tell your health care provider of new moles or changes in moles, especially if there is a change in shape or color. Also, tell your health care provider if a mole is larger than the size of a pencil eraser.  A one-time screening for abdominal aortic aneurysm (AAA) and surgical repair of large AAAs by ultrasound is recommended for men aged 59-75 years who are current or former smokers.  Stay current with your vaccines (immunizations).   This information is not intended to replace advice given to you by your health care provider. Make sure you discuss any questions you have with your health care provider.   Document Released: 08/20/2007 Document Revised: 03/14/2014 Document Reviewed: 07/19/2010 Elsevier Interactive Patient Education [employer].

## 2015-11-12 ENCOUNTER — Encounter: Payer: Self-pay | Admitting: *Deleted

## 2015-11-12 NOTE — Progress Notes (Signed)
Done

## 2015-11-16 ENCOUNTER — Ambulatory Visit (INDEPENDENT_AMBULATORY_CARE_PROVIDER_SITE_OTHER): Payer: PPO | Admitting: Family Medicine

## 2015-11-16 ENCOUNTER — Encounter: Payer: Self-pay | Admitting: Family Medicine

## 2015-11-16 DIAGNOSIS — M25562 Pain in left knee: Secondary | ICD-10-CM | POA: Diagnosis not present

## 2015-11-16 DIAGNOSIS — M170 Bilateral primary osteoarthritis of knee: Secondary | ICD-10-CM | POA: Insufficient documentation

## 2015-11-16 DIAGNOSIS — M25561 Pain in right knee: Secondary | ICD-10-CM

## 2015-11-16 MED ORDER — PREDNISONE 10 MG PO TABS: 10.0000 mg | ORAL_TABLET | Freq: Every day | ORAL | 0 refills | Status: DC

## 2015-11-16 NOTE — Assessment & Plan Note (Signed)
Patient is here with complaints of bilateral knee pain. History and physical exam most consistent with age-related degenerative changes and pain. Etiology deemed likely osteoarthritis versus meniscal degeneration versus a combination of both. A long discussion was had with the patient about different treatment options. Patient was NOT interested in: Joint injections, anti-inflammatories, lubricating injections (ortho referral), joint replacement (Ortho referral), or "nothing". - Oral prednisone Dosepak provided. - Patient wanted to go to a facility he had heard advertisements for. This provider had never heard of this facility. I informed him that he was welcome to try out the care at the facility he had heard advertised. If her referral was needed that I would need additional information on what care this facility provides. - f/u PRN

## 2015-11-16 NOTE — Patient Instructions (Signed)
It was a pleasure seeing you today in our clinic. Today we discussed your knee pain. Here is the treatment plan we have discussed and agreed upon together:   - Today we discussed your knee pain. I prescribed you prednisone. Take this medication as prescribed on the handout I provided you today. You will notice that each day you'll take slightly less than the previous day until the bottle is empty. - Follow-up with me as needed for this pain.

## 2015-11-16 NOTE — Progress Notes (Signed)
   HPI  CC: bilateral knee pain. Patient is here with complaints of bilateral knee pain. He states that he is always had some aches and pains but this has gotten significantly worse over the past few months. He denies any trauma or injury to these areas. Knees are worse with any ambulation. Tenderness is noted along the inferior border of the patella bilaterally. Left is worse than the right. He denies any significant joint swelling or irritation. He denies any popping, locking, or catching. He denies any fever, chills, weakness, numbness, paresthesias, or lower extremity swelling.  Medications/Interventions Tried: Rest and Tylenol  See HPI for ROS. CC and VS noted.  Objective: BP 133/79   Pulse 90   Temp 97.9 F (36.6 C) (Oral)   Wt 191 lb (86.6 kg)   BMI 26.64 kg/m  Gen: Right-Hand Dominant. NAD, alert, cooperative. CV: Well-perfused. Resp: Non-labored. Neuro: Sensation intact throughout. Knee, Bilateral: Normal to inspection with no erythema or effusion or obvious bony abnormalities. Palpation with no warmth. Medial and lateral joint line discomfort. Significant inferior pole patellar tenderness. ROM normal in flexion and extension and lower leg rotation. Ligaments with solid consistent endpoints including ACL, PCL, LCL, MCL. Negative Mcmurray's. Patellar and quadriceps tendons unremarkable. Hamstring and quadriceps strength is normal.   Assessment and plan:  Bilateral anterior knee pain Patient is here with complaints of bilateral knee pain. History and physical exam most consistent with age-related degenerative changes and pain. Etiology deemed likely osteoarthritis versus meniscal degeneration versus a combination of both. A long discussion was had with the patient about different treatment options. Patient was NOT interested in: Joint injections, anti-inflammatories, lubricating injections (ortho referral), joint replacement (Ortho referral), or "nothing". - Oral prednisone  Dosepak provided. - Patient wanted to go to a facility he had heard advertisements for. This provider had never heard of this facility. I informed him that he was welcome to try out the care at the facility he had heard advertised. If her referral was needed that I would need additional information on what care this facility provides. - f/u PRN   Meds ordered this encounter  Medications  . predniSONE (DELTASONE) 10 MG tablet    Sig: Take 1 tablet (10 mg total) by mouth daily with breakfast.    Dispense:  21 tablet    Refill:  0     Elberta Leatherwood, MD,MS,  PGY3 11/16/2015 6:30 PM

## 2015-11-30 ENCOUNTER — Other Ambulatory Visit: Payer: Self-pay | Admitting: *Deleted

## 2015-12-01 MED ORDER — ALLOPURINOL 300 MG PO TABS: 300.0000 mg | ORAL_TABLET | Freq: Every day | ORAL | 3 refills | Status: DC

## 2015-12-07 ENCOUNTER — Encounter: Payer: Self-pay | Admitting: Family Medicine

## 2015-12-07 ENCOUNTER — Ambulatory Visit (INDEPENDENT_AMBULATORY_CARE_PROVIDER_SITE_OTHER): Payer: PPO | Admitting: Family Medicine

## 2015-12-07 VITALS — BP 127/81 | HR 84 | Temp 98.2°F | Wt 188.0 lb

## 2015-12-07 DIAGNOSIS — Z23 Encounter for immunization: Secondary | ICD-10-CM

## 2015-12-07 DIAGNOSIS — M25562 Pain in left knee: Secondary | ICD-10-CM

## 2015-12-07 DIAGNOSIS — G8929 Other chronic pain: Secondary | ICD-10-CM

## 2015-12-07 DIAGNOSIS — M25561 Pain in right knee: Secondary | ICD-10-CM | POA: Diagnosis not present

## 2015-12-07 MED ORDER — METHYLPREDNISOLONE ACETATE 40 MG/ML IJ SUSP
80.0000 mg | Freq: Once | INTRAMUSCULAR | Status: AC
  Administered 2015-12-07: 80 mg via INTRAMUSCULAR

## 2015-12-07 NOTE — Progress Notes (Signed)
HPI  CC: Bilateral knee pain Patient is here with persistent bilateral knee pain. He was just seen last month for the same issue. At that time he states that his knee pain had been progressing over the past few years. Pain is located along the inferior aspect of his patellas bilaterally. Pain is worse with prolonged ambulation or standing. No history of trauma or injury. No significant swelling or erythema. Pain is constant and gradually worsening over the past few years. Pain is worse first thing in the morning and seems to get gradually better as he becomes more active throughout the day. Strength and stability have not been an issue he has no numbness paresthesias or weakness.  At the last visit I provided him a 6 day course of prednisone (Dosepak). Patient states that he received significant pain relief with this treatment. He states that now that he knows what is like to be pain-free he "really want(s) to be without pain".   Review of Systems   See HPI for ROS. All other systems reviewed and are negative.  CC, SH/smoking status, and VS noted  Objective: BP 127/81   Pulse 84   Temp 98.2 F (36.8 C) (Oral)   Wt 188 lb (85.3 kg)   BMI 26.22 kg/m  Gen: NAD, alert, cooperative. CV: Well-perfused. Resp: Non-labored. Neuro: Sensation intact throughout. Knees, bilateral: Normal to inspection with no erythema or effusion or obvious bony abnormalities. Palpation with no warmth. Medial and lateral joint line discomfort. Significant inferior pole patellar tenderness. ROM normal in flexion and extension and lower leg rotation. Ligaments with solid consistent endpoints including ACL, PCL, LCL, MCL. Negative Mcmurray's/Thessaly's. Patellar and quadriceps tendons unremarkable. Hamstring and quadriceps strength is normal.  INJECTION: Bilateral Knees Patient was given informed consent, signed copy in the chart. Appropriate time out was taken. Area prepped and draped in usual sterile fashion. 1 cc  of methylprednisolone 40 mg/ml plus  4 cc of 1% lidocaine without epinephrine was injected into the knees bilaterally using a(n) infero-lateral (Lt) and infero-medial (Rt) approach. The patient tolerated the procedures well. There were no complications. Post procedure instructions were given.   Assessment and plan:  Bilateral anterior knee pain Patient is here with continued complaints of bilateral knee pain. He had been seen one month ago for the same issue and provided a prednisone Dosepak. He endorses excellent improvement of symptoms with this treatment. No new injury. Pain is stable from previous exam but patient states that due to the significant improvement with the prednisone Dosepak he would like a more long-term solution to his pain. - Bilateral corticosteroid injections provided today. Patient tolerated well. - Bilateral knee radiographs ordered today. - I discussed with patient the possibility that these injections well last for a significant amount of time. However my concern is that patient's arthritis may actually be much more progressed than he realizes and that these injections will only provide short-term relief. If that is the case I brought up the topic of referral to orthopedics to assess for possible total knee arthroplasty. (No referral at this time)   Orders Placed This Encounter  Procedures  . DG Knee Complete 4 Views Left    Standing AP and Lateral images. + Sunrise    Standing Status:   Future    Standing Expiration Date:   02/05/2017    Order Specific Question:   Reason for Exam (SYMPTOM  OR DIAGNOSIS REQUIRED)    Answer:   Bilateral knee pain    Order Specific  Question:   Preferred imaging location?    Answer:   [facility]  . DG Knee Complete 4 Views Right    Standing AP and Lateral images. + Sunrise    Standing Status:   Future    Standing Expiration Date:   02/05/2017    Order Specific Question:   Reason for Exam (SYMPTOM  OR DIAGNOSIS REQUIRED)     Answer:   bilateral knee pain    Order Specific Question:   Preferred imaging location?    Answer:   [facility]  . Flu Vaccine QUAD 36+ mos IM    Meds ordered this encounter  Medications  . methylPREDNISolone acetate (DEPO-MEDROL) injection 80 mg     Elberta Leatherwood, MD,MS,  PGY3 12/07/2015 6:58 PM

## 2015-12-07 NOTE — Assessment & Plan Note (Signed)
Patient is here with continued complaints of bilateral knee pain. He had been seen one month ago for the same issue and provided a prednisone Dosepak. He endorses excellent improvement of symptoms with this treatment. No new injury. Pain is stable from previous exam but patient states that due to the significant improvement with the prednisone Dosepak he would like a more long-term solution to his pain. - Bilateral corticosteroid injections provided today. Patient tolerated well. - Bilateral knee radiographs ordered today. - I discussed with patient the possibility that these injections well last for a significant amount of time. However my concern is that patient's arthritis may actually be much more progressed than he realizes and that these injections will only provide short-term relief. If that is the case I brought up the topic of referral to orthopedics to assess for possible total knee arthroplasty. (No referral at this time)

## 2015-12-07 NOTE — Patient Instructions (Signed)
It was a pleasure seeing you today in our clinic. Today we discussed your knee pain. Here is the treatment plan we have discussed and agreed upon together:   - Today we injected both knees. You should have significant pain relief today. This will wear off later tonight but should improve gradually over the next 5 days. The second medication should be in full effect by the end of the week. My hope is you will have significant pain relief for at least 3-6 months. - Do not submerge your lower body beneath water for the next 24-48 hours. - If either knee becomes red, hot, painful, and stiff do not hesitate to contact our office or report to the nearest emergency department immediately.

## 2015-12-09 ENCOUNTER — Ambulatory Visit
Admission: RE | Admit: 2015-12-09 | Discharge: 2015-12-09 | Disposition: A | Discharge status: Home / Self Care | Payer: PPO | Source: Ambulatory Visit | Attending: Family Medicine | Admitting: Family Medicine

## 2015-12-09 ENCOUNTER — Other Ambulatory Visit: Payer: Self-pay | Admitting: Family Medicine

## 2015-12-09 DIAGNOSIS — M25561 Pain in right knee: Secondary | ICD-10-CM | POA: Diagnosis not present

## 2015-12-09 DIAGNOSIS — M25562 Pain in left knee: Principal | ICD-10-CM

## 2015-12-09 DIAGNOSIS — Z23 Encounter for immunization: Secondary | ICD-10-CM

## 2015-12-09 DIAGNOSIS — G8929 Other chronic pain: Secondary | ICD-10-CM

## 2015-12-14 ENCOUNTER — Other Ambulatory Visit: Payer: Self-pay | Admitting: *Deleted

## 2015-12-14 MED ORDER — TIOTROPIUM BROMIDE MONOHYDRATE 18 MCG IN CAPS: ORAL_CAPSULE | RESPIRATORY_TRACT | 6 refills | Status: DC

## 2015-12-29 ENCOUNTER — Other Ambulatory Visit: Payer: Self-pay | Admitting: *Deleted

## 2015-12-30 MED ORDER — FLUTICASONE-SALMETEROL 500-50 MCG/DOSE IN AEPB: INHALATION_SPRAY | RESPIRATORY_TRACT | 3 refills | Status: DC

## 2016-03-28 ENCOUNTER — Other Ambulatory Visit: Payer: Self-pay | Admitting: *Deleted

## 2016-03-28 MED ORDER — ALLOPURINOL 300 MG PO TABS: 300.0000 mg | ORAL_TABLET | Freq: Every day | ORAL | 3 refills | Status: DC

## 2016-04-27 ENCOUNTER — Ambulatory Visit (INDEPENDENT_AMBULATORY_CARE_PROVIDER_SITE_OTHER): Payer: PPO | Admitting: Family Medicine

## 2016-04-27 DIAGNOSIS — M25561 Pain in right knee: Secondary | ICD-10-CM | POA: Diagnosis not present

## 2016-04-27 DIAGNOSIS — M25562 Pain in left knee: Secondary | ICD-10-CM | POA: Diagnosis not present

## 2016-04-27 NOTE — Progress Notes (Signed)
   HPI  CC: Bilateral knee pain Patient is here for persistent bilateral knee pain. He states that he had excellent improvement in his pain after his most recent visit which she was provided steroid injections for both knees. He states that the pain has gotten acutely worse over the past week or so and he would like injections again. Pain is located to the medial and lateral joint lines laterally. Worse with going up and down stairs. No weakness. He denies any acute injury or trauma. No significant swelling or erythema. Pain is worse first thing in the morning and last thing at night. Some stiffness in the morning. Denies fever, chills, headache, blurred vision, dizziness, lightheadedness, shortness of breath, chest pain, nausea, vomiting, diarrhea.   Review of Systems    See HPI for ROS. All other systems reviewed and are negative.  CC, SH/smoking status, and VS noted  Objective: BP (!) 144/86   Pulse 91   Wt 194 lb (88 kg)   BMI 27.06 kg/m  Gen: NAD, alert, cooperative. CV: Well-perfused. Resp: Non-labored. Neuro: Sensation intact throughout. Knees, bilateral: Normal to inspection with no erythema or effusion or obvious bony abnormalities.Palpation with no warmth. Medial and lateraljoint line discomfort. Significant inferior pole patellar tenderness. ROM normal in flexion and extension and lower leg rotation.Ligaments with solid consistent endpoints including ACL, PCL, LCL, MCL.Negative Mcmurray's/Thessaly's.Patellar and quadriceps tendons unremarkable.Hamstring and quadriceps strength is normal.  INJECTION: Bilateral Knees Patient was given informed consent. Appropriate time out was taken. Area prepped and draped in usual sterile fashion. 1 cc of methylprednisolone 40 mg/ml plus  4 cc of 1% lidocaine without epinephrine was injected into the knees bilaterally using a(n) infero-lateral approach. The patient tolerated the procedures well. There were no complications. Post procedure  instructions were given.   Assessment and plan:  Bilateral anterior knee pain Patient is here with bilateral knee pain. Left is worse in the right. He received adequate pain control with previous steroid injections and would like to have another round today. Last injection was 4 months ago. Patient tolerated injections well. - Bilateral steroid injections provided. - Strict return precautions provided - Spent a lot of time today discussing long-term goals as patient may need bilateral knee replacement in the future. Due to his active lifestyle it may be ideal to have him look into this sooner rather than later, as I feel he was likely a good candidate at this time but may not be in years to come. - Follow-up when necessary    Elberta Leatherwood, MD,MS,  PGY3 04/27/2016 2:24 PM

## 2016-04-27 NOTE — Assessment & Plan Note (Addendum)
Patient is here with bilateral knee pain. Left is worse in the right. He received adequate pain control with previous steroid injections and would like to have another round today. Last injection was 4 months ago. Patient tolerated injections well. - Bilateral steroid injections provided. - Strict return precautions provided - Spent a lot of time today discussing long-term goals as patient may need bilateral knee replacement in the future. Due to his active lifestyle it may be ideal to have him look into this sooner rather than later, as I feel he was likely a good candidate at this time but may not be in years to come. - Follow-up when necessary

## 2016-04-29 ENCOUNTER — Other Ambulatory Visit: Payer: Self-pay | Admitting: Family Medicine

## 2016-05-11 ENCOUNTER — Other Ambulatory Visit: Payer: Self-pay | Admitting: *Deleted

## 2016-05-11 MED ORDER — LOSARTAN POTASSIUM 50 MG PO TABS: 50.0000 mg | ORAL_TABLET | Freq: Every day | ORAL | 3 refills | Status: DC

## 2016-07-10 NOTE — Progress Notes (Signed)
Marland Kitchen    HEMATOLOGY/ONCOLOGY CLINIC NOTE  Date of Service: .11/06/2015  Patient Care Team: McKeag, [name] Pearson, MD as PCP - General (Family Medicine) [name] Genera, MD as Consulting Physician (Hematology)  CHIEF COMPLAINTS/PURPOSE OF CONSULTATION:  Leucocytosis  HISTORY OF PRESENTING ILLNESS:   [name] is a wonderful 83 y.o. male who has been referred to [place] by Dr .[name], [name] Pearson, MD for evaluation and management of leucocytosis.  Patient has a history of iron deficiency anemia, hypertension, diabetes who was recently seen by his primary care physician and had a CBC on 09/16/2015 which showed leukocytosis with a WBC count of 12.3k no WBC differential done. His hemoglobin levels was normal at 13.9 with an MCV of 79 and a normal platelet count of 192k  Review of labs suggest he has had persistent leukocytosis since at least July 2013. WBC count of varying between 11k to 13.5k with mild lymphocytosis in the 4k range and some intermittent mild borderline eosinophilia.  Patient presented to his primary care physician with fatigue for 3-4 weeks and deep cough for about 2 months with some dyspnea on exertion. Patient has had a history of iron deficiency anemia and reports having a colonoscopy in 2010 which was negative.  Patient notes that he works in the [employer] and does get exposed to [employer]  Does not note any significant weight loss.  No recent use of steroids/prednisone.  He was a former smoker and quit smoking in 2013.  Has been in allopurinol for reported gout. This can sometimes cause hypersensitivity reactions with leukocytosis and eosinophilia.  No fevers no chills no night sweats no acute weight loss..  No other specific focal symptoms.  INTERVAL HISTORY  Patient is here for follow-up of his leukocytosis.She notes no change in his symptoms. His cough is somewhat better. Eating better. Flow cytometry showed no clonal lymphocytosis. Some increase in  non-clonal T-cell the natural killer cells suggestive of a reactive etiology.  WBC counts are down to 11.4k. CT of the chest showed possible mild bronchiectasis and inflammation changes which could be related to his cotton dust exposure. He does not have a productive cough to suggest an acute infection. No weight loss. We discussed that he should talk to his primary care physician about considering a pulmonary consultation for further workup of his CT findings.   MEDICAL HISTORY:  Past Medical History:  Diagnosis Date  . ANEMIA, IRON DEF, NOS 01/15/2007   Qualifier: Diagnosis of  By: [name] Kitten MD, [name]    . Arthritis   . Cataract extraction status of left eye 2016  . Cataract extraction status of right eye 2016  . CHOLECYSTECTOMY, LAPAROSCOPIC, HX OF 04/12/2007   Qualifier: Diagnosis of  By: [name] Starring  MD, [name]    . Gout 2007  . Hypertension     SURGICAL HISTORY: Past Surgical History:  Procedure Laterality Date  . CHOLECYSTECTOMY      SOCIAL HISTORY: Social History   Social History  . Marital status: Married    Spouse name: [name]  . Number of children: 53  . Years of education: 12   Occupational History  . RetiredAnimal nutritionist Work    Social History Main Topics  . Smoking status: Former Smoker    Packs/day: 0.50    Years: 60.00    Types: Cigars, Cigarettes    Start date: 03/08/1949    Quit date: 06/08/2011  . Smokeless tobacco: Never Used  . Alcohol use No  . Drug use: No  .  Sexual activity: No   Other Topics Concern  . Not on file   Social History Narrative   Health Care POA:    Emergency Contact: wife, [name], [phone]   End of Life Plan:    Who lives with you: wife, step daughter, granddaughter   Any pets: none   Diet: Patient has a varied diet and reports eating bacon and eggs every morning.   Exercise: Pt does water aerobic 2x a week.    Seatbelts: Pt reports wearing seatbelt when in vehicle.   Hobbies: none                   FAMILY  HISTORY: Family History  Problem Relation Age of Onset  . Asthma Mother   . Heart disease Brother   . Stroke Brother   . Diabetes Brother   . Cancer Brother     colon  . Arthritis Sister   . Arthritis Daughter   . Stroke Brother   . Heart disease Brother   . Alcohol abuse Sister   . Cirrhosis Sister   . Asthma Sister   . Diabetes Son   . Asthma Daughter     ALLERGIES:  has No Known Allergies.  MEDICATIONS:  Current Outpatient Prescriptions  Medication Sig Dispense Refill  . acetaminophen (TYLENOL) 325 MG tablet Take 2 tablets (650 mg total) by mouth every 6 (six) hours as needed for moderate pain. 60 tablet 1  . ADVAIR DISKUS 500-50 MCG/DOSE AEPB INHALE 1 PUFF INTO THE LUNGS 2 TIMES DAILY 60 each 3  . albuterol (PROVENTIL HFA;VENTOLIN HFA) 108 (90 BASE) MCG/ACT inhaler Inhale 2 puffs into the lungs every 6 (six) hours as needed for wheezing or shortness of breath. 1 Inhaler 3  . allopurinol (ZYLOPRIM) 300 MG tablet Take 1 tablet (300 mg total) by mouth daily. 30 tablet 3  . glucose blood (ONE TOUCH ULTRA TEST) test strip Use as instructed.  Dispense:   Quantity sufficient for one month supply of once daily testing. (Patient not taking: Reported on 11/10/2015) 100 each 12  . Lancets (ONETOUCH ULTRASOFT) lancets Use as instructed.  Dispense Quantity Sufficient for one month supply of once daily testing. (Patient not taking: Reported on 11/10/2015) 100 each 12  . losartan (COZAAR) 50 MG tablet Take 1 tablet (50 mg total) by mouth daily. 90 tablet 3  . predniSONE (DELTASONE) 10 MG tablet Take 1 tablet (10 mg total) by mouth daily with breakfast. 21 tablet 0  . tiotropium (SPIRIVA HANDIHALER) 18 MCG inhalation capsule PLACE 1 CAPSULE (18 MCG TOTAL) INTO INHALER AND INHALE DAILY. 30 capsule 6   No current facility-administered medications for this visit.     REVIEW OF SYSTEMS:    10 Point review of Systems was done is negative except as noted above.  PHYSICAL EXAMINATION: ECOG  PERFORMANCE STATUS: 1 - Symptomatic but completely ambulatory  . Vitals:   11/06/15 1031  BP: (!) 129/93  Pulse: 80  Resp: 18  Temp: 97.5 F (36.4 C)   Filed Weights   11/06/15 1031  Weight: 191 lb 14.4 oz (87 kg)   .Body mass index is 26.76 kg/m.  GENERAL:alert, in no acute distress and comfortable SKIN: skin color, texture, turgor are normal, no rashes or significant lesions EYES: normal, conjunctiva are pink and non-injected, sclera clear OROPHARYNX:no exudate, no erythema and lips, buccal mucosa, and tongue normal  NECK: supple, no JVD, thyroid normal size, non-tender, without nodularity LYMPH:  no palpable lymphadenopathy in the cervical, axillary or  inguinal LUNGS: clear to auscultation with normal respiratory effort, no overt rales or rhonchi.  HEART: regular rate & rhythm,  no murmurs and no lower extremity edema ABDOMEN: abdomen soft, non-tender, normoactive bowel sounds  Musculoskeletal: no cyanosis of digits and no clubbing  PSYCH: alert & oriented x 3 with fluent speech NEURO: no focal motor/sensory deficits  LABORATORY DATA:  I have reviewed the data as listed  . CBC Latest Ref Rng & Units 11/06/2015 10/15/2015 09/16/2015  WBC 4.0 - 10.3 10e3/uL 11.4(H) 12.7(H) 12.3(H)  Hemoglobin 13.0 - 17.1 g/dL 13.6 14.1 13.9  Hematocrit 38.4 - 49.9 % 38.9 40.3 41.3  Platelets 140 - 400 10e3/uL 178 198 192   . CBC    Component Value Date/Time   WBC 11.4 (H) 11/06/2015 1013   WBC 12.3 (H) 09/16/2015 1055   RBC 5.09 11/06/2015 1013   RBC 5.20 09/16/2015 1055   HGB 13.6 11/06/2015 1013   HCT 38.9 11/06/2015 1013   PLT 178 11/06/2015 1013   MCV 76.4 (L) 11/06/2015 1013   MCH 26.7 (L) 11/06/2015 1013   MCH 26.7 (L) 09/16/2015 1055   MCHC 35.0 11/06/2015 1013   MCHC 33.7 09/16/2015 1055   RDW 15.3 (H) 11/06/2015 1013   LYMPHSABS 3.5 (H) 11/06/2015 1013   MONOABS 0.9 11/06/2015 1013   EOSABS 0.6 (H) 11/06/2015 1013   BASOSABS 0.0 11/06/2015 1013    . CMP Latest  Ref Rng & Units 11/06/2015 10/15/2015 09/23/2015  Glucose 70 - 140 mg/dl 116 95 128(H)  BUN 7.0 - 26.0 mg/dL 23.5 29.2(H) 16  Creatinine 0.7 - 1.3 mg/dL 1.3 1.6(H) 1.25(H)  Sodium 136 - 145 mEq/L 138 142 138  Potassium 3.5 - 5.1 mEq/L 4.2 4.5 4.5  Chloride 98 - 110 mmol/L - - 104  CO2 22 - 29 mEq/L 19(L) 24 21  Calcium 8.4 - 10.4 mg/dL 9.4 10.2 9.3  Total Protein 6.4 - 8.3 g/dL 7.3 7.7 -  Total Bilirubin 0.20 - 1.20 mg/dL 0.53 0.47 -  Alkaline Phos 40 - 150 U/L 74 75 -  AST 5 - 34 U/L 15 17 -  ALT 0 - 55 U/L 13 17 -   Component     Latest Ref Rng & Units 10/15/2015  WBC     4.0 - 10.3 10e3/uL 12.7 (H)  NEUT#     1.5 - 6.5 10e3/uL 7.3 (H)  Hemoglobin     13.0 - 17.1 g/dL 14.1  HCT     38.4 - 49.9 % 40.3  Platelets     140 - 400 10e3/uL 198  MCV     79.3 - 98.0 fL 77.6 (L)  MCH     27.2 - 33.4 pg 27.2  MCHC     32.0 - 36.0 g/dL 35.0  RBC     4.20 - 5.82 10e6/uL 5.19  RDW     11.0 - 14.6 % 15.4 (H)  lymph#     0.9 - 3.3 10e3/uL 3.9 (H)  MONO#     0.1 - 0.9 10e3/uL 0.9  Eosinophils Absolute     0.0 - 0.5 10e3/uL 0.6 (H)  Basophils Absolute     0.0 - 0.1 10e3/uL 0.0  NEUT%     39.0 - 75.0 % 57.9  LYMPH%     14.0 - 49.0 % 30.7  MONO%     0.0 - 14.0 % 6.8  EOS%     0.0 - 7.0 % 4.3  BASO%     0.0 - 2.0 %  0.3  Retic %     0.80 - 1.80 % 1.22  Retic Ct Abs     34.80 - 93.90 10e3/uL 63.32  Immature Retic Fract     3.00 - 10.60 % 9.00  Sodium     136 - 145 mEq/L 142  Potassium     3.5 - 5.1 mEq/L 4.5  Chloride     98 - 109 mEq/L 110 (H)  CO2     22 - 29 mEq/L 24  Glucose     70 - 140 mg/dl 95  BUN     7.0 - 26.0 mg/dL 29.2 (H)  Creatinine     0.7 - 1.3 mg/dL 1.6 (H)  Total Bilirubin     0.20 - 1.20 mg/dL 0.47  Alkaline Phosphatase     40 - 150 U/L 75  AST     5 - 34 U/L 17  ALT     0 - 55 U/L 17  Total Protein     6.4 - 8.3 g/dL 7.7  Albumin     3.5 - 5.0 g/dL 4.1  Calcium     8.4 - 10.4 mg/dL 10.2  Anion gap     3 - 11 mEq/L 9  EGFR     >90  ml/min/1.73 m2 46 (L)      RADIOGRAPHIC STUDIES: I have personally reviewed the radiological images as listed and agreed with the findings in the report.  CT CHEST WITHOUT CONTRAST  TECHNIQUE: Multidetector CT imaging of the chest was performed following the standard protocol without IV contrast.  COMPARISON:  None.  FINDINGS: Cardiovascular: Atherosclerotic calcification of the arterial vasculature, including three-vessel involvement of the coronary arteries. Heart size normal. No pericardial effusion.  Mediastinum/Nodes: No pathologically enlarged mediastinal lymph nodes. Hilar regions are difficult to definitively evaluate without IV contrast. Right axillary lymph node measures 1.6 cm. No left axillary adenopathy. Esophagus is grossly unremarkable.  Lungs/Pleura: Biapical pleural parenchymal scarring. There is patchy ground-glass at the lung bases, possibly subacute in the age. Probable mucoid impaction in the upper lobes. Difficult to exclude pulmonary nodules. Minimal scattered peribronchovascular nodularity. Mild bronchiectasis. No pleural fluid. Adherent debris in the left mainstem bronchus.  Upper Abdomen: Visualized portions of the liver, adrenal glands, kidneys, spleen, pancreas, stomach and bowel are grossly unremarkable. Cholecystectomy. No upper abdominal adenopathy.  Musculoskeletal: No worrisome lytic or sclerotic lesions. Well-circumscribed partially sclerotic lesion in the T12 vertebral body is possibly a hemangioma.  IMPRESSION: 1. Minimal scattered peribronchovascular nodularity, mucoid impaction and bibasilar ground-glass. Findings are somewhat nonspecific and may be post infectious or post inflammatory in etiology. Chronic mycobacterium avium complex is another consideration. Allergic bronchopulmonary aspergillosis is not excluded. Consider short-term follow-up CT chest without contrast in 4-6 weeks in further evaluation, as malignant  lesions in the upper lobes cannot be excluded. 2. Mildly enlarged right axillary lymph node. Attention on followup exams is warranted. 3. Aortic atherosclerosis and three-vessel coronary artery calcification.   Electronically Signed   By: Lorin Picket M.D.   On: 11/02/2015 16:08   ASSESSMENT & PLAN:   83 year old gentleman with   #1 Chronic leukocytosis with some borderline lymphocytosis This has been chronically and nonprogressive and present since at least 2013. Patient was a smoker to 2013 and this can cause persistent leukocytosis/lymphocytosis even after quitting smoking. Flow cytometry shows polyclonal B lymphocytosis and nonclonal increase in T lymphocytes and NK cells suggesting likely reactive inflammatory process or recent viral infection.   #2 persistent Cough. This has been evaluated  by his primary care physician. Also notes significant fatigue and dyspnea on exertion. He notes exposure to cotton dust since he works in the [employer]. CT chest shows mild bronchiectasis, mucoid impaction and likely inflammatory changes ? Pneumoconiosis worsens atypical infection versus allergic bronchopulmonary aspergillosis. Plan -Patient notes his respiratory symptoms have improved since his last visit. -He was recommended to talk to his primary doctor about getting a pulmonary consultation for further workup. -Will need a repeat CT scan of the chest in 6 months. Earlier if worsening symptoms. -Patient has a follow-up appointment in a few days with his primary care physician and notes that he will discuss this. -No indications for bone marrow biopsy at this time . - kindly consult [place] if WBC counts show progressive increase or the patient develops any other CBC abnormalities .  #3 dehydration with slight bump in creatinine to 1.6. This is now improved to 1.3 with better hydration.   #4  microcytosis without significant anemia - likely has alpha thalassemia trait  .  Continue follow-up with primary care physician . Currently reconsult [place] of any new questions or concerns arise .  All of the patients questions were answered with apparent satisfaction. The patient knows to call the clinic with any problems, questions or concerns.  I spent 20 minutes counseling the patient face to face. The total time spent in the appointment was 25 minutes and more than 50% was on counseling and direct patient cares.    Sullivan Lone MD [place] AAHIVMS [facility] Hematology/Oncology Physician [facility]  (Office):       [phone] (Work cell):  [phone] (Fax):           [phone]

## 2016-07-11 ENCOUNTER — Other Ambulatory Visit: Payer: Self-pay | Admitting: Family Medicine

## 2016-07-26 ENCOUNTER — Other Ambulatory Visit: Payer: Self-pay | Admitting: Family Medicine

## 2016-08-23 ENCOUNTER — Encounter: Payer: Self-pay | Admitting: Family Medicine

## 2016-08-23 ENCOUNTER — Ambulatory Visit (INDEPENDENT_AMBULATORY_CARE_PROVIDER_SITE_OTHER): Payer: PPO | Admitting: Family Medicine

## 2016-08-23 VITALS — BP 128/70 | HR 92 | Temp 97.6°F | Ht 71.0 in | Wt 192.0 lb

## 2016-08-23 DIAGNOSIS — L255 Unspecified contact dermatitis due to plants, except food: Secondary | ICD-10-CM

## 2016-08-23 DIAGNOSIS — E119 Type 2 diabetes mellitus without complications: Secondary | ICD-10-CM

## 2016-08-23 LAB — POCT GLYCOSYLATED HEMOGLOBIN (HGB A1C): Hemoglobin A1C: 6.6

## 2016-08-23 MED ORDER — TRIAMCINOLONE ACETONIDE 0.1 % EX OINT: 1.0000 "application " | TOPICAL_OINTMENT | Freq: Two times a day (BID) | CUTANEOUS | 1 refills | Status: DC

## 2016-08-23 NOTE — Progress Notes (Signed)
   HPI  CC: RASH Started Friday. Seems like its getting worse since. Working outside most of last week.  Had rash for 4 days. Location: bilateral arms Medications tried: calamine lotion and cortisone lotion Similar rash in past: yes but many years ago New medications or antibiotics: no Tick, Insect or new pet exposure: exposure to some "vines" in the backyard Recent travel: no New detergent or soap: no Immunocompromised: no  Symptoms Itching: yes Pain over rash: no Feeling ill all over: no Fever: no Mouth sores: no Face or tongue swelling: no Trouble breathing: no Joint swelling or pain: no  Review of Symptoms - see HPI PMH - Smoking status noted.    Objective: BP 128/70 (BP Location: Left Arm, Patient Position: Sitting, Cuff Size: Large)   Pulse 92   Temp 97.6 F (36.4 C) (Oral)   Ht 5\' 11"  (1.803 m)   Wt 192 lb (87.1 kg)   SpO2 96%   BMI 26.78 kg/m  Gen: NAD, alert, cooperative. CV: Well-perfused. Resp: Non-labored. Neuro: Sensation intact throughout. Integument: Multiple erythematous papular/vesicular lesions noted on patient's arms bilaterally. Lesions are pruritic. Additional developing lesions noted on patient's abdomen and left flank. No current drainage present but multiple clear vesicles noted throughout.   Assessment and plan:  Toxicodendron dermatitis Patient is presenting with signs and symptoms consistent with exposure to poison ivy/oak/sumac. No evidence of current infection. No new drug exposures. No mucosal involvement. Rash seems to be worsening over the past 24 hours. - Encouraged washing clothes and sheets and they've been exposed to plant oil. - Triamcinolone ointment twice a day 2 weeks - Discussed avoidance of suspicious plants. - Return precautions discussed   Orders Placed This Encounter  Procedures  . HgB A1c    Meds ordered this encounter  Medications  . triamcinolone ointment (KENALOG) 0.1 %    Sig: Apply 1 application  topically 2 (two) times daily.    Dispense:  80 g    Refill:  1     Elberta Leatherwood, MD,MS,  PGY3 08/23/2016 7:52 PM

## 2016-08-23 NOTE — Assessment & Plan Note (Addendum)
Patient is presenting with signs and symptoms consistent with exposure to poison ivy/oak/sumac. No evidence of current infection. No new drug exposures. No mucosal involvement. Rash seems to be worsening over the past 24 hours. - Encouraged washing clothes and sheets and they've been exposed to plant oil. - Triamcinolone ointment twice a day 2 weeks - Discussed avoidance of suspicious plants. - Return precautions discussed

## 2016-08-23 NOTE — Patient Instructions (Signed)
It was a pleasure seeing you today in our clinic. Today we discussed your poison ivy. Here is the treatment plan we have discussed and agreed upon together:   - I prescribed you a steroid cream. Apply this twice a day to the affected areas. Unfortunately this will not reduce the overall duration of this poison ivy rash. I would anticipate 2-3 weeks of this rash.    Poison Ivy Dermatitis Poison ivy dermatitis is inflammation of the skin that is caused by the allergens on the leaves of the poison ivy plant. The skin reaction often involves redness, swelling, blisters, and extreme itching. What are the causes? This condition is caused by a specific chemical (urushiol) found in the sap of the poison ivy plant. This chemical is sticky and can be easily spread to people, animals, and objects. You can get poison ivy dermatitis by:  Having direct contact with a poison ivy plant.  Touching animals, other people, or objects that have come in contact with poison ivy and have the chemical on them.  What increases the risk? This condition is more likely to develop in:  People who are outdoors often.  People who go outdoors without wearing protective clothing, such as closed shoes, long pants, and a long-sleeved shirt.  What are the signs or symptoms? Symptoms of this condition include:  Redness and itching.  A rash that often includes bumps and blisters. The rash usually appears 48 hours after exposure.  Swelling. This may occur if the reaction is more severe.  Symptoms usually last for 1-2 weeks. However, the first time you develop this condition, symptoms may last 3-4 weeks. How is this diagnosed? This condition may be diagnosed based on your symptoms and a physical exam. Your health care provider may also ask you about any recent outdoor activity. How is this treated? Treatment for this condition will vary depending on how severe it is. Treatment may include:  Hydrocortisone creams or  calamine lotions to relieve itching.  Oatmeal baths to soothe the skin.  Over-the-counter antihistamine tablets.  Oral steroid medicine for more severe outbreaks.  Follow these instructions at home:  Take or apply over-the-counter and prescription medicines only as told by your health care provider.  Wash exposed skin as soon as possible with soap and cold water.  Use hydrocortisone creams or calamine lotion as needed to soothe the skin and relieve itching.  Take oatmeal baths as needed. Use colloidal oatmeal. You can get this at your local pharmacy or grocery store. Follow the instructions on the packaging.  Do not scratch or rub your skin.  While you have the rash, wash clothes right after you wear them. How is this prevented?  Learn to identify the poison ivy plant and avoid contact with the plant. This plant can be recognized by the number of leaves. Generally, poison ivy has three leaves with flowering branches on a single stem. The leaves are typically glossy, and they have jagged edges that come to a point at the front.  If you have been exposed to poison ivy, thoroughly wash with soap and water right away. You have about 30 minutes to remove the plant resin before it will cause the rash. Be sure to wash under your fingernails because any plant resin there will continue to spread the rash.  When hiking or camping, wear clothes that will help you to avoid exposure on the skin. This includes long pants, a long-sleeved shirt, tall socks, and hiking boots. You can also apply  preventive lotion to your skin to help limit exposure.  If you suspect that your clothes or outdoor gear came in contact with poison ivy, rinse them off outside with a garden hose before you bring them inside your house. Contact a health care provider if:  You have open sores in the rash area.  You have more redness, swelling, or pain in the affected area.  You have redness that spreads beyond the rash  area.  You have fluid, blood, or pus coming from the affected area.  You have a fever.  You have a rash over a large area of your body.  You have a rash on your eyes, mouth, or genitals.  Your rash does not improve after a few days. Get help right away if:  Your face swells or your eyes swell shut.  You have trouble breathing.  You have trouble swallowing. This information is not intended to replace advice given to you by your health care provider. Make sure you discuss any questions you have with your health care provider. Document Released: 02/19/2000 Document Revised: 07/30/2015 Document Reviewed: 07/30/2014 Elsevier Interactive Patient Education  [employer].

## 2016-08-24 ENCOUNTER — Other Ambulatory Visit: Payer: Self-pay | Admitting: Family Medicine

## 2016-09-01 ENCOUNTER — Ambulatory Visit (INDEPENDENT_AMBULATORY_CARE_PROVIDER_SITE_OTHER): Payer: PPO | Admitting: Family Medicine

## 2016-09-01 ENCOUNTER — Encounter: Payer: Self-pay | Admitting: Family Medicine

## 2016-09-01 DIAGNOSIS — M25562 Pain in left knee: Secondary | ICD-10-CM

## 2016-09-01 DIAGNOSIS — M25561 Pain in right knee: Secondary | ICD-10-CM

## 2016-09-01 DIAGNOSIS — L255 Unspecified contact dermatitis due to plants, except food: Secondary | ICD-10-CM | POA: Diagnosis not present

## 2016-09-01 MED ORDER — METHYLPREDNISOLONE ACETATE 40 MG/ML IJ SUSP
40.0000 mg | Freq: Once | INTRAMUSCULAR | Status: AC
  Administered 2016-09-01: 40 mg via INTRAMUSCULAR

## 2016-09-01 NOTE — Progress Notes (Signed)
HPI  CC: Bilateral knee pain and f/u for rash [name] is a 83 yo male presenting for bilateral knee pain secondary to osteoarthritis and for follow up of rash secondary to poison ivy. Patient presented on 08/23/16 for rash due to exposure to poison ivy. Patient reports that rash is significantly improved with oatmeal baths and over the counter Cortisone. Patient also presented for bilateral knee pain secondary to osteoarthritis and has a history of improvement with steroid injection in both knees. Patient states that knee pain has increased and would like injections in knees bilaterally. Patient states that normally the left knee is more severe than the right but within the last 2 days the right knee has been more severe compared to the left knee. Patient has no significant erythema or edema.   ROS:  Objective:  Vitals: BP130/68 P96 T98.2  Gen: Patient is alert and oriented, NAD, cooperative  Skin: Patient presents with rashes bilaterally on upper arms and on epigastric region. Rashes are improved from previous visit and appear smaller.  CV: Well perfused Resp: non labored Knees: no erythema or effusion or obvious bony abnormalities. Palpation with no warmth.   INJECTION: Bilateral knee Patient was given informed consent, signed copy in the chart. Appropriate time out was taken. Area prepped and draped in usual sterile fashion. 1 cc of methylprednisolone 40 mg/ml plus  4 cc of 1% lidocaine without epinephrine was injected into the right and left knee using a(n) inferior medial and inferior lateral approach respectivaly. The patient tolerated the procedure well. There were no complications. Post procedure instructions were given.   Assessment and plan:  Bilateral anterior knee pain Patient presented with bilateral anterior knee pain. Pain was worse in right knee. Patient received adequate pain control with previous steroid injection and would like another round of injections bilaterally  today. Last injection was in February 2018. Patient tolerated injections well. -Bilateral steroid injection in medial right and lateral left knee.  -Return precautions provided  -Patient was advised not to submerge knees in water for at least 2 days.  -Follow up when necessary    Toxicodendron dermatitis  Patient presented with bilateral upper arm rash and epigastric rash secondary to poison ivy exposure. Rash seems to be improving and patient reports decreased itching and size of rash.  -Encouraged patient to continue oatmeal baths and OTC cortisone as needed to soothe itching.  -Follow up as needed   Caroline More, DO, PGY-1 09/01/2016 11:59 AM

## 2016-09-01 NOTE — Addendum Note (Signed)
Addended by: Junious Dresser on: 09/01/2016 12:28 PM   Modules accepted: Orders

## 2016-09-01 NOTE — Patient Instructions (Signed)
Knee Injection A knee injection is a procedure to get medicine into your knee joint. Your health care provider puts a needle into the joint and injects medicine with an attached syringe. The injected medicine may relieve the pain, swelling, and stiffness of arthritis. The injected medicine may also help to lubricate and cushion your knee joint. You may need more than one injection. Tell a health care provider about:  Any allergies you have.  All medicines you are taking, including vitamins, herbs, eye drops, creams, and over-the-counter medicines.  Any problems you or family members have had with anesthetic medicines.  Any blood disorders you have.  Any surgeries you have had.  Any medical conditions you have. What are the risks? Generally, this is a safe procedure. However, problems may occur, including:  Infection.  Bleeding.  Worsening symptoms.  Damage to the area around your knee.  Allergic reaction to any of the medicines.  Skin reactions from repeated injections.  What happens before the procedure?  Ask your health care provider about changing or stopping your regular medicines. This is especially important if you are taking diabetes medicines or blood thinners.  Plan to have someone take you home after the procedure. What happens during the procedure?  You will sit or lie down in a position for your knee to be treated.  The skin over your kneecap will be cleaned with a germ-killing solution (antiseptic).  You will be given a medicine that numbs the area (local anesthetic). You may feel some stinging.  After your knee becomes numb, you will have a second injection. This is the medicine. This needle is carefully placed between your kneecap and your knee. The medicine is injected into the joint space.  At the end of the procedure, the needle will be removed.  A bandage (dressing) may be placed over the injection site. The procedure may vary among health care  providers and hospitals. What happens after the procedure?  You may have to move your knee through its full range of motion. This helps to get all of the medicine into your joint space.  Your blood pressure, heart rate, breathing rate, and blood oxygen level will be monitored often until the medicines you were given have worn off.  You will be watched to make sure that you do not have a reaction to the injected medicine. This information is not intended to replace advice given to you by your health care provider. Make sure you discuss any questions you have with your health care provider. Document Released: 05/15/2006 Document Revised: 07/24/2015 Document Reviewed: 01/01/2014 Elsevier Interactive Patient Education  2018 Elsevier Inc.  

## 2016-09-01 NOTE — Assessment & Plan Note (Signed)
Patient presented with bilateral upper arm rash and epigastric rash secondary to poison ivy exposure. Rash seems to be improving and patient reports decreased itching and size of rash.  -Encouraged patient to continue oatmeal baths and OTC cortisone as needed to soothe itching.  -Follow up as needed

## 2016-09-01 NOTE — Assessment & Plan Note (Signed)
Patient presented with bilateral anterior knee pain. Pain was worse in right knee. Patient received adequate pain control with previous steroid injection and would like another round of injections bilaterally today. Last injection was in February 2018. Patient tolerated injections well. -Bilateral steroid injection in medial right and lateral left knee.  -Return precautions provided  -Patient was advised not to submerge knees in water for at least 2 days.  -Follow up when necessary

## 2016-11-24 ENCOUNTER — Other Ambulatory Visit: Payer: Self-pay | Admitting: Family Medicine

## 2016-11-24 MED ORDER — ALLOPURINOL 300 MG PO TABS: 300.0000 mg | ORAL_TABLET | Freq: Every day | ORAL | 3 refills | Status: DC

## 2016-12-29 ENCOUNTER — Ambulatory Visit (INDEPENDENT_AMBULATORY_CARE_PROVIDER_SITE_OTHER): Payer: PPO | Admitting: *Deleted

## 2016-12-29 DIAGNOSIS — Z23 Encounter for immunization: Secondary | ICD-10-CM

## 2017-01-30 ENCOUNTER — Other Ambulatory Visit: Payer: Self-pay

## 2017-01-30 NOTE — Patient Outreach (Signed)
[name]) Care Management  01/30/2017  [name] November 12, 1933 [id number]  TELEPHONE SCREENING Referral date: 01/24/17 Referral source: [employer] Referral reason: Diabetes education Insurance: [employer] team advantage  Telephone call to patient regarding [employer] referral. HIPAA verified with patient. Discussed and offered [facility] care management services to patient. Patient refused services. Patient states he is managing his diabetes at this time and does not feel he needs additional follow up. Patient states he will be scheduling a follow up appointment with his primary MD. Patient verbally agreed to receive [facility] care management brochure.  PLAN: RNCM will refer patient to care management assistant to close patient due to patient refusing services.  RNCM will notify patients primary MD of closure.  RNCM will send patient [facility] care management brochure as discussed.   Quinn Plowman RN,BSN,CCM [facility] Telephonic  [phone]

## 2017-02-08 ENCOUNTER — Telehealth: Payer: Self-pay | Admitting: Family Medicine

## 2017-02-08 NOTE — Telephone Encounter (Signed)
Pt is calling because he needed a refill on his Advair, this was denied by [place] since he had not been her in a while. He had schedule an appointment for 03/08/17 to see his doctor and get a check up. Can we al least call in enough Advair for the patient to get him to his appointment. jw

## 2017-02-09 ENCOUNTER — Other Ambulatory Visit: Payer: Self-pay | Admitting: Family Medicine

## 2017-02-09 MED ORDER — FLUTICASONE-SALMETEROL 500-50 MCG/DOSE IN AEPB: INHALATION_SPRAY | RESPIRATORY_TRACT | 3 refills | Status: DC

## 2017-02-09 NOTE — Telephone Encounter (Signed)
Pt calling about this refill for Advair that he called about yesterday. I don't believe this message was routed yesterday. Please advise

## 2017-02-15 ENCOUNTER — Telehealth: Payer: Self-pay | Admitting: Family Medicine

## 2017-02-15 NOTE — Telephone Encounter (Signed)
Needs refill on spirva. Has appt 03-08-17.  [facility] [address]

## 2017-02-16 ENCOUNTER — Other Ambulatory Visit: Payer: Self-pay | Admitting: Family Medicine

## 2017-02-16 MED ORDER — ALBUTEROL SULFATE HFA 108 (90 BASE) MCG/ACT IN AERS: 2.0000 | INHALATION_SPRAY | Freq: Four times a day (QID) | RESPIRATORY_TRACT | 3 refills | Status: DC | PRN

## 2017-02-16 MED ORDER — TIOTROPIUM BROMIDE MONOHYDRATE 18 MCG IN CAPS: ORAL_CAPSULE | RESPIRATORY_TRACT | 6 refills | Status: DC

## 2017-02-16 NOTE — Telephone Encounter (Signed)
This has been filled.   Thanks, Cherly Anderson. Rosalyn Gess, [place] Resident PGY-2 02/16/2017 1:14 PM

## 2017-02-16 NOTE — Telephone Encounter (Signed)
LVM for pt to call back to inform him of below. Zimmerman Rumple, Shylah Dossantos D, CMA  

## 2017-02-16 NOTE — Telephone Encounter (Signed)
Pt informed. Zimmerman Rumple, April D, CMA  

## 2017-03-08 ENCOUNTER — Ambulatory Visit (INDEPENDENT_AMBULATORY_CARE_PROVIDER_SITE_OTHER): Payer: PPO | Admitting: Family Medicine

## 2017-03-08 VITALS — BP 125/80 | HR 114 | Temp 97.5°F | Wt 191.8 lb

## 2017-03-08 DIAGNOSIS — E119 Type 2 diabetes mellitus without complications: Secondary | ICD-10-CM | POA: Diagnosis not present

## 2017-03-08 DIAGNOSIS — M1710 Unilateral primary osteoarthritis, unspecified knee: Secondary | ICD-10-CM | POA: Diagnosis not present

## 2017-03-08 DIAGNOSIS — M170 Bilateral primary osteoarthritis of knee: Secondary | ICD-10-CM | POA: Diagnosis not present

## 2017-03-08 LAB — POCT GLYCOSYLATED HEMOGLOBIN (HGB A1C): Hemoglobin A1C: 6.9

## 2017-03-08 MED ORDER — ALBUTEROL SULFATE HFA 108 (90 BASE) MCG/ACT IN AERS: 2.0000 | INHALATION_SPRAY | Freq: Four times a day (QID) | RESPIRATORY_TRACT | 3 refills | Status: DC | PRN

## 2017-03-08 MED ORDER — TIOTROPIUM BROMIDE MONOHYDRATE 18 MCG IN CAPS: ORAL_CAPSULE | RESPIRATORY_TRACT | 6 refills | Status: DC

## 2017-03-08 MED ORDER — ALLOPURINOL 300 MG PO TABS: 300.0000 mg | ORAL_TABLET | Freq: Every day | ORAL | 6 refills | Status: DC

## 2017-03-08 NOTE — Patient Instructions (Addendum)
[name], you were seen today for knee pain and I injected your knees.  I help with this provides your with some relief.  I would recommend that you take ibuprofen as needed for pain.  You could also pick up a knee brace for your left knee that can help with your symptoms.   If you develop any redness, drainage, worsening pain, fevers or chills in the next few days please come back urgently.   I am checking your hemoglobin A1c to make sure that this is not been getting any higher.  I will call you if I have any concerns.   Very nice to see you today, [name] L. Rosalyn Gess, [place] [facility] Resident PGY-2 03/08/2017 2:38 PM

## 2017-03-09 ENCOUNTER — Encounter: Payer: Self-pay | Admitting: Family Medicine

## 2017-03-09 NOTE — Assessment & Plan Note (Signed)
Ultrasound performed of bilateral knees showed no significant collection of fluid in popliteal fossa.  Patellar and quadriceps tendons intact bilaterally and some joint space narrowing bilaterally consistent with 2017 x-rays.  Patient requesting knee injections today.  Performed corticosteroid injection with anterior approach without complication.  Recommended the patient follow-up in 6 months for repeat injections if persistent symptoms.  Discussed return precautions, icing the knees and ibuprofen as needed as well as weightbearing exercising.

## 2017-03-09 NOTE — Progress Notes (Signed)
    Subjective:  [name] Ditullio is a 84 y.o. adult who presents to the [facility] today for bilateral knee pain  HPI:  Bilateral knee pain Patient is a history of osteoarthritis of the knees last imaging performed 12/09/2015 showing mild joint space narrowing bilaterally.  Has previously had corticosteroid injections and reports good improvement symptoms.  He denies any locking, popping, feelings of instability or swelling.  Does note some feeling of pain behind his left knee with no appreciable swelling.  He denies any fevers, chills, nausea, vomiting or diarrhea.  He denies any trauma to the knees bilaterally.  PMH: Bilateral knee osteoarthritis Tobacco use: Non-smoker Medication: reviewed and updated ROS: see HPI   Objective:  Physical Exam: BP 125/80 (BP Location: Left Arm, Patient Position: Sitting, Cuff Size: Normal)   Pulse (!) 114   Temp (!) 97.5 F (36.4 C) (Oral)   Wt 191 lb 12.8 oz (87 kg)   SpO2 94%   BMI 26.75 kg/m   Gen: 84 year old male in NAD, resting comfortably CV: RRR with no murmurs appreciated Pulm: NWOB, CTAB with no crackles, wheezes, or rhonchi GI: Normal bowel sounds present. Soft, Nontender, Nondistended. MSK: Knees with no gross deformity or edema.  Bilateral crepitus of the patellas bilaterally with knee extension.  No joint line tenderness.  Positive patellar compression bilaterally.  Good endpoints with medial lateral joint lines.  Negative Lockman bilaterally, negative Thessaly.  Skin: warm, dry Neuro: grossly normal, moves all extremities Psych: Normal affect and thought content  Results for orders placed or performed in visit on 03/08/17 (from the past 72 hour(s))  HgB A1c     Status: None   Collection Time: 03/08/17  2:44 PM  Result Value Ref Range   Hemoglobin A1C 6.9      Assessment/Plan:  Osteoarthritis of knees, bilateral Ultrasound performed of bilateral knees showed no significant collection of fluid in popliteal fossa.  Patellar and  quadriceps tendons intact bilaterally and some joint space narrowing bilaterally consistent with 2017 x-rays.  Patient requesting knee injections today.  Performed corticosteroid injection with anterior approach without complication.  Recommended the patient follow-up in 6 months for repeat injections if persistent symptoms.  Discussed return precautions, icing the knees and ibuprofen as needed as well as weightbearing exercising.   Thelda Gagan L. Rosalyn Gess, [place] Resident PGY-2 03/09/2017 1:30 PM

## 2017-03-10 MED ORDER — METHYLPREDNISOLONE ACETATE 40 MG/ML IJ SUSP
40.0000 mg | Freq: Once | INTRAMUSCULAR | Status: AC
  Administered 2017-03-08: 40 mg via INTRAMUSCULAR

## 2017-03-10 MED ORDER — METHYLPREDNISOLONE ACETATE 40 MG/ML IJ SUSP: 40.0000 mg | Freq: Once | INTRAMUSCULAR | Status: DC

## 2017-03-10 NOTE — Addendum Note (Signed)
Addended by: Leonia Corona R on: 03/10/2017 10:35 AM   Modules accepted: Orders

## 2017-04-25 ENCOUNTER — Telehealth: Payer: Self-pay

## 2017-04-25 ENCOUNTER — Other Ambulatory Visit: Payer: Self-pay

## 2017-04-25 ENCOUNTER — Ambulatory Visit (INDEPENDENT_AMBULATORY_CARE_PROVIDER_SITE_OTHER): Payer: PPO | Admitting: Internal Medicine

## 2017-04-25 VITALS — BP 112/86 | HR 85 | Temp 98.1°F | Ht 69.0 in | Wt 188.6 lb

## 2017-04-25 DIAGNOSIS — J302 Other seasonal allergic rhinitis: Secondary | ICD-10-CM | POA: Diagnosis not present

## 2017-04-25 DIAGNOSIS — R300 Dysuria: Secondary | ICD-10-CM | POA: Diagnosis not present

## 2017-04-25 LAB — POCT URINALYSIS DIP (MANUAL ENTRY)
Bilirubin, UA: NEGATIVE
Blood, UA: NEGATIVE
Glucose, UA: NEGATIVE mg/dL
Ketones, POC UA: NEGATIVE mg/dL
Leukocytes, UA: NEGATIVE
Nitrite, UA: NEGATIVE
Protein Ur, POC: NEGATIVE mg/dL
Spec Grav, UA: 1.015 (ref 1.010–1.025)
Urobilinogen, UA: 0.2 E.U./dL
pH, UA: 7 (ref 5.0–8.0)

## 2017-04-25 MED ORDER — DOXYCYCLINE HYCLATE 100 MG PO TABS: 100.0000 mg | ORAL_TABLET | Freq: Two times a day (BID) | ORAL | 0 refills | Status: AC

## 2017-04-25 MED ORDER — TAMSULOSIN HCL 0.4 MG PO CAPS: 0.4000 mg | ORAL_CAPSULE | Freq: Every day | ORAL | 0 refills | Status: DC

## 2017-04-25 MED ORDER — CETIRIZINE HCL 5 MG PO TABS: 5.0000 mg | ORAL_TABLET | Freq: Every day | ORAL | 1 refills | Status: DC

## 2017-04-25 MED ORDER — SILODOSIN 8 MG PO CAPS: 8.0000 mg | ORAL_CAPSULE | Freq: Every day | ORAL | 0 refills | Status: DC

## 2017-04-25 NOTE — Telephone Encounter (Signed)
[facility] requesting an alternative medication for Silodosin. This medication is not covered. Please advise. Ottis Stain, CMA

## 2017-04-25 NOTE — Patient Instructions (Addendum)
I do not think the itchy and watery eyes are due to an infection. It is likely allergy related. We will try a course of Zyrtec nightly to see if this improves your symptoms.   We will start Doxycycline 1 tablet twice a day for 14 days for a likely prostate infection  We will also start Rapaflo 8mg  daily to help you urinate more easily  Please follow up in 1 week

## 2017-04-25 NOTE — Progress Notes (Signed)
   [facility] Phone: [phone]   Date of Visit: 04/25/2017   HPI:  Dysuria:  - symptoms started on Sunday with dysuria and increased urinary frequency  - reports it takes a while to start urinating and also reports of weak stream in the past few days  - feels like he is not emptying his bladder well  - no prior history of similar symptoms  - no prior history of BPH  - no flank pain  -Has a history of type 2 diabetes but it is well controlled with recent A1c below 7  Runny Eyes:  - watery eyes, burning/stinging/itching of both eye for 2-3 days - also has sneezing  - no nasal congestion or runny nose - no prior history of seasonal allergies - no foreign body sensation  - no blurred vision  - OTC eye drops which does not help.    ROS: See HPI.  [place]:  PMH: Asthma DM2 HTM COPD  PHYSICAL EXAM: BP 112/86 (BP Location: Left Arm, Patient Position: Sitting, Cuff Size: Large)   Pulse 85   Temp 98.1 F (36.7 C) (Oral)   Ht 5\' 9"  (1.753 m)   Wt 188 lb 9.6 oz (85.5 kg)   SpO2 96%   BMI 27.85 kg/m  GEN: NAD HEENT: Atraumatic, normocephalic, neck supple without lymphadenopathy, EOMI without any pain, mildly erythematous bilaterally, PERRL, nasal turbinates, appear normal CV: RRR, no murmurs, rubs, or gallops PULM: CTAB, normal effort ABD: Soft, nontender, nondistended, NABS, no organomegaly Digital rectal exam: Prostate appears to be normal SKIN: No rash or cyanosis; warm and well-perfused PSYCH: Mood and affect euthymic, normal rate and volume of speech NEURO: Awake, alert, no focal deficits grossly, normal speech   ASSESSMENT/PLAN:  1. Dysuria UA is unremarkable therefore UTI is unlikely.  Discussed with attending.  Will treat as a possible prostate infection.  Patient is not sexually active.  Will start Flomax 0.4 mg daily and doxycycline 100 mg twice daily for 14 days.  Follow-up in 1 week to reassess. - POCT urinalysis dipstick  2.   Allergies: Watery eyes along with sneezing may be consistent with allergies.  Patient is open to doing a trial of Zyrtec.  There are no signs or symptoms of bacterial conjunctivitis or periorbital cellulitis or orbital cellulitis.   Smiley Houseman, MD PGY [place]

## 2017-04-26 NOTE — Telephone Encounter (Signed)
Sent in [employer]

## 2017-05-03 ENCOUNTER — Ambulatory Visit: Payer: PPO | Admitting: Family Medicine

## 2017-05-08 ENCOUNTER — Encounter: Payer: Self-pay | Admitting: Student

## 2017-05-08 ENCOUNTER — Other Ambulatory Visit: Payer: Self-pay

## 2017-05-08 ENCOUNTER — Ambulatory Visit (INDEPENDENT_AMBULATORY_CARE_PROVIDER_SITE_OTHER): Payer: PPO | Admitting: Student

## 2017-05-08 VITALS — BP 108/74 | HR 97 | Temp 97.9°F | Ht 69.0 in | Wt 189.6 lb

## 2017-05-08 DIAGNOSIS — M170 Bilateral primary osteoarthritis of knee: Secondary | ICD-10-CM

## 2017-05-08 DIAGNOSIS — R399 Unspecified symptoms and signs involving the genitourinary system: Secondary | ICD-10-CM | POA: Diagnosis not present

## 2017-05-08 LAB — POCT URINALYSIS DIP (MANUAL ENTRY)
Bilirubin, UA: NEGATIVE
Blood, UA: NEGATIVE
Glucose, UA: NEGATIVE mg/dL
Ketones, POC UA: NEGATIVE mg/dL
Leukocytes, UA: NEGATIVE
Nitrite, UA: NEGATIVE
Protein Ur, POC: NEGATIVE mg/dL
Spec Grav, UA: 1.01 (ref 1.010–1.025)
Urobilinogen, UA: 0.2 E.U./dL
pH, UA: 5.5 (ref 5.0–8.0)

## 2017-05-08 MED ORDER — DICLOFENAC SODIUM 1 % TD GEL: 2.0000 g | Freq: Four times a day (QID) | TRANSDERMAL | 0 refills | Status: DC

## 2017-05-08 NOTE — Progress Notes (Signed)
Subjective:    [name] is a 84 y.o. old adult here for follow-up on urinary symptoms.  HPI LUTS: was seen in clinic about two weeks ago for dysuria. His urinalysis was negative for infection. He was treated with Doxycycline for possible prostitis, and started on Flomax. He reports taking significant improvement in his symptoms.  He is still taking the doxycycline.  He says he got 2 capsules left.  He is also taking his Flomax.  Bilateral knee pain: This is a chronic issue.  He has history of bilateral osteoarthritis.  He has gotten steroid injection in the past.  Denies swelling or fever.  He asks if he can get the Voltaren gel.  PMH/Problem List: has Gout; Asthma; COPD; Hypertension; Herniated nucleus pulposus, L4-5; Sacral nerve root compression; Abnormal AST and ALT; Type 2 diabetes mellitus ([place]); History of anemia; Dry skin dermatitis; Dyslipidemia; Pain of left heel; Left foot pain; Hoarseness; Muscle strain; Left hip pain; COPD exacerbation ([place]); Osteoarthritis of knees, bilateral; and Toxicodendron dermatitis on their problem list.   has a past medical history of ANEMIA, IRON DEF, NOS (01/15/2007), Arthritis, Cataract extraction status of left eye (2016), Cataract extraction status of right eye (2016), CHOLECYSTECTOMY, LAPAROSCOPIC, HX OF (04/12/2007), Gout (2007), and Hypertension.  FH:  Family History  Problem Relation Age of Onset  . Asthma Mother   . Heart disease Brother   . Stroke Brother   . Diabetes Brother   . Cancer Brother        colon  . Arthritis Sister   . Arthritis Daughter   . Stroke Brother   . Heart disease Brother   . Alcohol abuse Sister   . Cirrhosis Sister   . Asthma Sister   . Diabetes Son   . Asthma Daughter     SH Social History   Tobacco Use  . Smoking status: Former Smoker    Packs/day: 0.50    Years: 60.00    Pack years: 30.00    Types: Cigars, Cigarettes    Start date: 03/08/1949    Last attempt to quit: 06/08/2011    Years since  quitting: 5.9  . Smokeless tobacco: Never Used  Substance Use Topics  . Alcohol use: No  . Drug use: No    Review of Systems Review of systems negative except for pertinent positives and negatives in history of present illness above.     Objective:     Vitals:   05/08/17 1459  BP: 108/74  Pulse: 97  Temp: 97.9 F (36.6 C)  TempSrc: Oral  SpO2: 97%  Weight: 189 lb 9.6 oz (86 kg)  Height: 5\' 9"  (1.753 m)   Body mass index is 28 kg/m.  Physical Exam  GEN: appears well, no apparent distress. CVS: RRR, nl s1 & s2, no murmurs, no edema RESP: no IWOB GU: no suprapubic or CVA tenderness MSK:  Right and left knees Normal to inspection with no erythema or effusion or obvious bony abnormalities. No warmth to touch. Has joint line tenderness bilaterally ROM full in flexion and extension and lower leg rotation. Patellar glide with crepitus. Ligaments with solid consistent endpoints  Normal sensation in right leg, foot.   Neurovascularly intact with good distal pulses.  SKIN: As above NEURO: alert and oiented appropriately, no gross deficits  PSYCH: euthymic mood with congruent affect    Assessment and Plan:  1. Lower urinary tract symptoms (LUTS): significant improvement with doxycycline and tamsulosin.  I-PSS score 5 today. QOL 1 (pleased).  Urinalysis completely  normal today. -Continue Flomax -Follow-up as needed  2. Osteoarthritis of both knees, unspecified osteoarthritis type: Chronic issue.  Exam with joint line tenderness and crepitus.  No swelling or signs of infection. - diclofenac sodium (VOLTAREN) 1 % GEL; Apply 2 g topically 4 (four) times daily.  Dispense: 400 g; Refill: 0  Return if symptoms worsen or fail to improve.  Mercy Riding, MD 05/08/17 Pager: [phone]

## 2017-05-08 NOTE — Patient Instructions (Signed)
It was great seeing you today! We have addressed the following issues today  Urinary issue: Blood that has gotten better.  Your urine tests is normal today.  Continue the antibiotic until you finish the course.  Continue taking your tamsulosin.  Knee pain: We sent a prescription for Voltaren gel to your pharmacy.  If we did any lab work today, and the results require attention, either me or my nurse will get in touch with you. If everything is normal, you will get a letter in mail and a message via . If you don't hear from [place] in two weeks, please give [place] a call. Otherwise, we look forward to seeing you again at your next visit. If you have any questions or concerns before then, please call the clinic at [phone].  Please bring all your medications to every doctors visit  Sign up for My Chart to have easy access to your labs results, and communication with your Primary care physician.    Please check-out at the front desk before leaving the clinic.    Take Care,   Dr. Cyndia Skeeters

## 2017-05-19 ENCOUNTER — Other Ambulatory Visit: Payer: Self-pay

## 2017-05-19 ENCOUNTER — Encounter: Payer: Self-pay | Admitting: Family Medicine

## 2017-05-19 ENCOUNTER — Ambulatory Visit (INDEPENDENT_AMBULATORY_CARE_PROVIDER_SITE_OTHER): Payer: PPO | Admitting: Family Medicine

## 2017-05-19 VITALS — BP 102/58 | HR 112 | Temp 97.9°F | Ht 69.0 in | Wt 189.2 lb

## 2017-05-19 DIAGNOSIS — I951 Orthostatic hypotension: Secondary | ICD-10-CM | POA: Diagnosis not present

## 2017-05-19 DIAGNOSIS — K5900 Constipation, unspecified: Secondary | ICD-10-CM

## 2017-05-19 DIAGNOSIS — E119 Type 2 diabetes mellitus without complications: Secondary | ICD-10-CM

## 2017-05-19 LAB — GLUCOSE, POCT (MANUAL RESULT ENTRY): POC Glucose: 170 mg/dl — AB (ref 70–99)

## 2017-05-19 NOTE — Patient Instructions (Addendum)
I think you do indeed have decreased blood pressure leading to you feeling so bad.  I want you to continue to remain hydrated as much as possible through the weekend.  Stop the losartan and the flomax.  Both of these together are dropping your blood pressure.    Come back and see [place] on Monday to make sure you are feeling better.    It was good to meet you today

## 2017-05-19 NOTE — Progress Notes (Signed)
Subjective:    [name] is a 84 y.o. adult who presents to [facility] today for :"feeling bad":  1.  Malaise: For past 1-2 weeks patient has been feeling increasing lightheadedness, headaches which are not usual for him, general malaise and not feeling well.  About a month ago he was prescribed Flomax for what initially was thought to be prostatitis and then later deemed to be BPH.  Started abx and felt better from this standpoint. Although he picked medicine up he did not start until about a week before his symptoms began.  He continues to take his losartan.  He describes a little increased urination during the day and decreased nocturia since starting the Flomax.  He has had no falls.  No chest pain.  No palpitations.  No dyspnea any worse than usual the COPD.  No cough.  He has long-standing history of "feeling cold" for the past months to years.  His wife and daughter present today and states that they agree he is simply not his usual self.  They have heard him complain of being cold over this time period as well.  No fevers or chills.    No abd pain.  Patient states his appetite is good but wife states it is decreased.  He has been drinking a little less water and drinking more posterior than usual.  No nausea or vomiting.  He has been constipated for the past 2 3 days.  He is passing gas well.  No dysuria.   ROS as above per HPI.    The following portions of the patient's history were reviewed and updated as appropriate: allergies, current medications, past medical history, family and social history, and problem list. Patient is a nonsmoker.    PMH reviewed.  Past Medical History:  Diagnosis Date  . ANEMIA, IRON DEF, NOS 01/15/2007   Qualifier: Diagnosis of  By: Jeannine Kitten MD, Rodman Key    . Arthritis   . Cataract extraction status of left eye 2016  . Cataract extraction status of right eye 2016  . CHOLECYSTECTOMY, LAPAROSCOPIC, HX OF 04/12/2007   Qualifier: Diagnosis of  By: Netty Starring  MD,  Lucianne Muss    . Gout 2007  . Hypertension    Past Surgical History:  Procedure Laterality Date  . CHOLECYSTECTOMY      Medications reviewed. Current Outpatient Medications  Medication Sig Dispense Refill  . acetaminophen (TYLENOL) 325 MG tablet Take 2 tablets (650 mg total) by mouth every 6 (six) hours as needed for moderate pain. 60 tablet 1  . albuterol (PROVENTIL HFA;VENTOLIN HFA) 108 (90 Base) MCG/ACT inhaler Inhale 2 puffs into the lungs every 6 (six) hours as needed for wheezing or shortness of breath. 1 Inhaler 3  . allopurinol (ZYLOPRIM) 300 MG tablet Take 1 tablet (300 mg total) by mouth daily. 30 tablet 6  . cetirizine (ZYRTEC) 5 MG tablet Take 1 tablet (5 mg total) by mouth daily. 30 tablet 1  . diclofenac sodium (VOLTAREN) 1 % GEL Apply 2 g topically 4 (four) times daily. 400 g 0  . Fluticasone-Salmeterol (ADVAIR DISKUS) 500-50 MCG/DOSE AEPB INHALE 1 PUFF INTO THE LUNGS 2 TIMES DAILY 60 each 3  . glucose blood (ONE TOUCH ULTRA TEST) test strip Use as instructed.  Dispense:   Quantity sufficient for one month supply of once daily testing. (Patient not taking: Reported on 11/10/2015) 100 each 12  . Lancets (ONETOUCH ULTRASOFT) lancets Use as instructed.  Dispense Quantity Sufficient for one month supply of once daily testing. (Patient  not taking: Reported on 11/10/2015) 100 each 12  . losartan (COZAAR) 50 MG tablet Take 1 tablet (50 mg total) by mouth daily. 90 tablet 3  . tamsulosin (FLOMAX) 0.4 MG CAPS capsule Take 1 capsule (0.4 mg total) by mouth daily. 30 capsule 0  . tiotropium (SPIRIVA HANDIHALER) 18 MCG inhalation capsule PLACE 1 CAPSULE (18 MCG TOTAL) INTO INHALER AND INHALE DAILY. 30 capsule 6  . triamcinolone ointment (KENALOG) 0.1 % Apply 1 application topically 2 (two) times daily. 80 g 1   Current Facility-Administered Medications  Medication Dose Route Frequency Provider Last Rate Last Dose  . methylPREDNISolone acetate (DEPO-MEDROL) injection 40 mg  40 mg Intramuscular  Once Eloise Levels, MD         Objective:   Physical Exam BP (!) 102/58   Pulse (!) 112   Temp 97.9 F (36.6 C) (Oral)   Ht 5\' 9"  (1.753 m)   Wt 189 lb 3.2 oz (85.8 kg)   SpO2 94%   BMI 27.94 kg/m  Gen:  Alert, cooperative patient who appears stated age in no acute distress.  Vital signs reviewed. HEENT: EOMI, PERRL.  MMM Neck:  Flat neck veins when lying flat.   Cardiac:  Tachycardic with regular rhythm Pulm:  Clear to auscultation Abd:  Soft/very minimally distended.  Nontender.  Good bowel sounds throughout all four quadrants.  No masses noted.  Exts: Non edematous BL  LE, warm and well perfused.  Neuro:  Awake and alert x4.  Easily conversates.  No focal deficits noted throughout.  Imp/Plan: 1. Orthostatic hypotension: - secondary to use of flomax plus continued losartan - likely etiology for all symptoms of malaise, etc - CBG not low (170) - No evidence of MI, CVA, etc.  No further signs of infection.  - checking creatinine today in light of low BPs and continued ARB use.   2.  Constipation: - prune juice to treat.

## 2017-05-20 LAB — CBC
Hematocrit: 41.2 % (ref 37.5–51.0)
Hemoglobin: 13.9 g/dL (ref 13.0–17.7)
MCH: 26.2 pg — ABNORMAL LOW (ref 26.6–33.0)
MCHC: 33.7 g/dL (ref 31.5–35.7)
MCV: 78 fL — ABNORMAL LOW (ref 79–97)
Platelets: 189 10*3/uL (ref 150–379)
RBC: 5.31 x10E6/uL (ref 4.14–5.80)
RDW: 16.5 % — ABNORMAL HIGH (ref 12.3–15.4)
WBC: 11.8 10*3/uL — ABNORMAL HIGH (ref 3.4–10.8)

## 2017-05-20 LAB — COMPREHENSIVE METABOLIC PANEL
ALT: 17 IU/L (ref 0–44)
AST: 16 IU/L (ref 0–40)
Albumin/Globulin Ratio: 1.5 (ref 1.2–2.2)
Albumin: 4.3 g/dL (ref 3.5–4.7)
Alkaline Phosphatase: 80 IU/L (ref 39–117)
BUN/Creatinine Ratio: 14 (ref 10–24)
BUN: 22 mg/dL (ref 8–27)
Bilirubin Total: 0.3 mg/dL (ref 0.0–1.2)
CO2: 24 mmol/L (ref 20–29)
Calcium: 10 mg/dL (ref 8.6–10.2)
Chloride: 99 mmol/L (ref 96–106)
Creatinine, Ser: 1.52 mg/dL — ABNORMAL HIGH (ref 0.76–1.27)
GFR calc Af Amer: 49 mL/min/{1.73_m2} — ABNORMAL LOW (ref >59)
GFR calc non Af Amer: 42 mL/min/{1.73_m2} — ABNORMAL LOW (ref >59)
Globulin, Total: 2.8 g/dL (ref 1.5–4.5)
Glucose: 158 mg/dL — ABNORMAL HIGH (ref 65–99)
Potassium: 5.3 mmol/L — ABNORMAL HIGH (ref 3.5–5.2)
Sodium: 138 mmol/L (ref 134–144)
Total Protein: 7.1 g/dL (ref 6.0–8.5)

## 2017-05-22 ENCOUNTER — Encounter: Payer: Self-pay | Admitting: Family Medicine

## 2017-05-22 ENCOUNTER — Other Ambulatory Visit: Payer: Self-pay

## 2017-05-22 ENCOUNTER — Ambulatory Visit (INDEPENDENT_AMBULATORY_CARE_PROVIDER_SITE_OTHER): Payer: PPO | Admitting: Family Medicine

## 2017-05-22 VITALS — BP 142/80 | HR 102 | Temp 97.7°F | Wt 190.0 lb

## 2017-05-22 DIAGNOSIS — E119 Type 2 diabetes mellitus without complications: Secondary | ICD-10-CM

## 2017-05-22 DIAGNOSIS — I10 Essential (primary) hypertension: Secondary | ICD-10-CM

## 2017-05-22 DIAGNOSIS — I951 Orthostatic hypotension: Secondary | ICD-10-CM | POA: Diagnosis not present

## 2017-05-22 LAB — POCT GLYCOSYLATED HEMOGLOBIN (HGB A1C): Hemoglobin A1C: 6.8

## 2017-05-22 MED ORDER — LOSARTAN POTASSIUM 25 MG PO TABS: 25.0000 mg | ORAL_TABLET | Freq: Every day | ORAL | 0 refills | Status: DC

## 2017-05-22 NOTE — Assessment & Plan Note (Signed)
Patient recently seen for orthostatic hypotension in the setting of flomax and losartan. BP near goal today of <140/90. Discussed option of restarting losartan at reduced dose of 25mg  qd vs staying off medications and home monitoring. Patient opted for staying off medications with home monitoring. He and his wife both voiced good understanding to call back if BP > 150/90. Follow up in 1 month to establish with new PCP. If BP elevated at time would restart losartan 25mg  qd.

## 2017-05-22 NOTE — Patient Instructions (Addendum)
It was good to see you today!  Glad to hear that your symptoms are better and your BP looks good today. Stay off BP medication for now with monitoring at home. Your goal for BP is <140/90. Call us if your BPs at home are higher than this. Stay off the flomax as well.  Keep drinking lots of water, and eating fruits and vegetables to manage your constipation.  Please check-out at the front desk before leaving the clinic. Make an appointment in  1 month to meet your new PCP and follow up on diabetes.  Please bring all of your medications with you to each visit.   Sign up for My Chart to have easy access to your labs results, and communication with your primary care physician.  Feel free to call with any questions or concerns at any time, at [phone].   Take care,  Dr. Bufford Lope, [place]

## 2017-05-22 NOTE — Progress Notes (Signed)
    Subjective:  [name] is a 84 y.o. adult who presents to the [facility] today for follow up on orthostatic hypotension.  HPI:  Was seen on 05/19/17 here for feeling lightheaded and was found to have orthostatic hypotension 2/2 flomax with losartan so both medications were stopped. Today he states that he feels well. No longer feeling malaise or lightheaded. Has no difficulty going from laying to sitting or standing. No HA, CP, palpitations.  No difficulty urinating since stopping the flomax.  ROS: Per HPI  Objective:  Physical Exam: BP (!) 142/80   Pulse (!) 102   Temp 97.7 F (36.5 C) (Oral)   Wt 190 lb (86.2 kg)   SpO2 97%   BMI 28.06 kg/m  Pulse recheck 92 Gen: NAD, resting comfortably CV: RRR with no murmurs appreciated Pulm: NWOB, CTAB with no crackles, wheezes, or rhonchi GI: Normal bowel sounds present. Soft, Nontender, Nondistended. MSK: no edema, cyanosis, or clubbing noted Skin: warm, dry Neuro: grossly normal, moves all extremities Psych: Normal affect and thought content   Assessment/Plan:  Hypertension Patient recently seen for orthostatic hypotension in the setting of flomax and losartan. BP near goal today of <140/90. Discussed option of restarting losartan at reduced dose of 25mg  qd vs staying off medications and home monitoring. Patient opted for staying off medications with home monitoring. He and his wife both voiced good understanding to call back if BP > 150/90. Follow up in 1 month to establish with new PCP. If BP elevated at time would restart losartan 25mg  qd.   Bufford Lope, DO PGY-2, [facility] 05/22/2017 8:58 AM

## 2017-05-25 ENCOUNTER — Other Ambulatory Visit: Payer: Self-pay | Admitting: Internal Medicine

## 2017-05-26 ENCOUNTER — Telehealth: Payer: Self-pay

## 2017-05-26 NOTE — Telephone Encounter (Signed)
Pt walked in to office asking if someone could show him how to use his BP cuff. Pt has an electronic wrist cuff. Showed patient how to put on and record his BP. His cuff came back with a reading of 192/117. I checked his BP manually and was 150/82. Pt states he is currently off his BP meds per PCP due to his BP being too low. Will forward to PCP to have for his next OV. Wallace Cullens, RN

## 2017-06-06 ENCOUNTER — Telehealth: Payer: Self-pay | Admitting: *Deleted

## 2017-06-06 NOTE — Telephone Encounter (Signed)
Per last clinic note patient is not supposed to be on losartan due to low pressures. I have never seen this patient. I have an appointment scheduled with him on 4/26 and will make the decision on his management then. Until then I will not refill this med and proceed with plan from 3/18 office visit. Please let patient know that he does not need this refilled at this time.  Guadalupe Dawn MD PGY-1 Family Medicine Resident

## 2017-06-06 NOTE — Telephone Encounter (Signed)
Pharmacy requesting RX for 90-day supply on losartan 50mg  tab. Please advise. Deseree Kennon Holter, CMA

## 2017-06-07 NOTE — Telephone Encounter (Signed)
Called patient per Dr. Kris Mouton and informed his wife Enid Derry that patient does not need Losartan and will be able to address it at his next appointment if he feels that he still needs it.Ozella Almond, [place]

## 2017-06-30 ENCOUNTER — Other Ambulatory Visit: Payer: Self-pay

## 2017-06-30 ENCOUNTER — Encounter: Payer: Self-pay | Admitting: Family Medicine

## 2017-06-30 ENCOUNTER — Ambulatory Visit (INDEPENDENT_AMBULATORY_CARE_PROVIDER_SITE_OTHER): Payer: PPO | Admitting: Family Medicine

## 2017-06-30 VITALS — BP 118/70 | HR 101 | Temp 97.8°F | Ht 69.0 in | Wt 190.4 lb

## 2017-06-30 DIAGNOSIS — J302 Other seasonal allergic rhinitis: Secondary | ICD-10-CM

## 2017-06-30 DIAGNOSIS — I10 Essential (primary) hypertension: Secondary | ICD-10-CM

## 2017-06-30 MED ORDER — CETIRIZINE HCL 10 MG PO TABS: 10.0000 mg | ORAL_TABLET | Freq: Every day | ORAL | 0 refills | Status: DC

## 2017-06-30 MED ORDER — CETIRIZINE HCL 5 MG PO TABS: 10.0000 mg | ORAL_TABLET | Freq: Every day | ORAL | 0 refills | Status: DC

## 2017-06-30 NOTE — Assessment & Plan Note (Signed)
110/70 at clinic today. Has been feeling well. No indication to restart his losartan at this time. Will also continue to hold the flomax. Will continue to monitor BP at clinic visits.

## 2017-06-30 NOTE — Progress Notes (Signed)
   HPI 84 year old male who presents for blood pressure follow. Patient recently seen in mid march because he was "feeling bad". Found to have orthostatic hypotension. He was stopped off his losartan and flomax at that time. Since then he has had no episodes of malaise and no complaints. BP of 118/70 at this appointment.  Patient is also complaining of persistent cough at night. He states this started around 1 month ago. The cough is only minimally productive. No bloody sputum. He states that he has not noticed any increased wheezing. Of note, his two daughters had similar symptoms that started around 2 weeks ago. 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  CC: blood pressure follow up, cough   ROS:  Review of Systems See HPI for ROS.   CC, SH/smoking status, and VS noted  Objective: BP 118/70   Pulse (!) 101   Temp 97.8 F (36.6 C) (Oral)   Ht 5\' 9"  (1.753 m)   Wt 86.4 kg (190 lb 6.4 oz)   SpO2 93%   BMI 28.12 kg/m  Gen: NAD, alert, cooperative, and pleasant. Well appearing, elderly, [name] CV: RRR, no murmur Resp: CTAB, no wheezes, non-labored Abd: SNTND, BS present, no guarding or organomegaly Ext: No edema, warm Neuro: Alert and oriented, Speech clear, No gross deficits BLE: intact pt/dp bilaterally, no signs of skin breakdown   Assessment and plan:  Essential hypertension 110/70 at clinic today. Has been feeling well. No indication to restart his losartan at this time. Will also continue to hold the flomax. Will continue to monitor BP at clinic visits.  Seasonal allergies Patient's cough is likely to be seasonal allergies, perhaps exacerbating his COPD. He has not wheezing on exam today so I think a COPD exacerbation is unlikely. Have asked patient to try zyrtec 10mg  daily to try and better control his allergies. He is to follow up in 2 weeks for knee joint injections and will assess how this treatment has been  working.   No orders of the defined types were placed in this encounter.   Meds ordered this encounter  Medications  . DISCONTD: cetirizine (ZYRTEC) 5 MG tablet    Sig: Take 2 tablets (10 mg total) by mouth daily.    Dispense:  30 tablet    Refill:  0  . cetirizine (ZYRTEC) 10 MG tablet    Sig: Take 1 tablet (10 mg total) by mouth daily.    Dispense:  30 tablet    Refill:  0     Guadalupe Dawn MD PGY-1 Family Medicine Resident  06/30/2017 7:35 PM

## 2017-06-30 NOTE — Patient Instructions (Signed)
It was great meeting you today! I am glad that things have been going well. I think that your blood pressure is fantastic today and I dont think we need to make any changes. It was 118/70, which is very close to the ideal blood pressure of 120/80. Please continue not taking your losartan and your flomax. In regards to your cough I think it is due to a combination of your allergie making your COPD worse. I think starting cetirizine 10mg  in the day is a good option for you. I think if we can control the allergies your cough will greatly improve. In regards to your knee pain, if you can, please schedule a follow up appointment for a couple of weeks. We can do joint injections at that visit if needed. We can also check on the status of your cough at that time. I gave you a discount coupon for the cetirizine.

## 2017-06-30 NOTE — Assessment & Plan Note (Addendum)
Patient's cough is likely to be seasonal allergies, perhaps exacerbating his COPD. He has not wheezing on exam today so I think a COPD exacerbation is unlikely. Have asked patient to try zyrtec 10mg  daily to try and better control his allergies. He is to follow up in 2 weeks for knee joint injections and will assess how this treatment has been working.

## 2017-07-11 ENCOUNTER — Other Ambulatory Visit: Payer: Self-pay | Admitting: Student

## 2017-07-11 DIAGNOSIS — M170 Bilateral primary osteoarthritis of knee: Secondary | ICD-10-CM

## 2017-07-17 ENCOUNTER — Encounter: Payer: Self-pay | Admitting: Internal Medicine

## 2017-07-17 ENCOUNTER — Other Ambulatory Visit: Payer: Self-pay

## 2017-07-17 ENCOUNTER — Ambulatory Visit (INDEPENDENT_AMBULATORY_CARE_PROVIDER_SITE_OTHER): Payer: PPO | Admitting: Internal Medicine

## 2017-07-17 ENCOUNTER — Emergency Department (HOSPITAL_COMMUNITY): Payer: PPO

## 2017-07-17 ENCOUNTER — Encounter (HOSPITAL_COMMUNITY): Payer: Self-pay | Admitting: Emergency Medicine

## 2017-07-17 ENCOUNTER — Inpatient Hospital Stay (HOSPITAL_COMMUNITY)
Admission: EM | Admit: 2017-07-17 | Discharge: 2017-07-20 | DRG: 871 | Disposition: A | Discharge status: Home / Self Care | Payer: PPO | Attending: Family Medicine | Admitting: Family Medicine

## 2017-07-17 ENCOUNTER — Inpatient Hospital Stay (HOSPITAL_COMMUNITY): Payer: PPO

## 2017-07-17 ENCOUNTER — Ambulatory Visit (HOSPITAL_COMMUNITY)
Admission: RE | Admit: 2017-07-17 | Discharge: 2017-07-17 | Disposition: A | Discharge status: Home / Self Care | Payer: PPO | Source: Ambulatory Visit | Attending: Family Medicine | Admitting: Family Medicine

## 2017-07-17 DIAGNOSIS — Z8249 Family history of ischemic heart disease and other diseases of the circulatory system: Secondary | ICD-10-CM | POA: Diagnosis not present

## 2017-07-17 DIAGNOSIS — A419 Sepsis, unspecified organism: Secondary | ICD-10-CM | POA: Diagnosis not present

## 2017-07-17 DIAGNOSIS — Z8261 Family history of arthritis: Secondary | ICD-10-CM | POA: Diagnosis not present

## 2017-07-17 DIAGNOSIS — J189 Pneumonia, unspecified organism: Secondary | ICD-10-CM

## 2017-07-17 DIAGNOSIS — Z811 Family history of alcohol abuse and dependence: Secondary | ICD-10-CM | POA: Diagnosis not present

## 2017-07-17 DIAGNOSIS — M109 Gout, unspecified: Secondary | ICD-10-CM | POA: Diagnosis not present

## 2017-07-17 DIAGNOSIS — D509 Iron deficiency anemia, unspecified: Secondary | ICD-10-CM | POA: Diagnosis present

## 2017-07-17 DIAGNOSIS — J449 Chronic obstructive pulmonary disease, unspecified: Secondary | ICD-10-CM | POA: Diagnosis not present

## 2017-07-17 DIAGNOSIS — Z79899 Other long term (current) drug therapy: Secondary | ICD-10-CM

## 2017-07-17 DIAGNOSIS — N182 Chronic kidney disease, stage 2 (mild): Secondary | ICD-10-CM | POA: Diagnosis not present

## 2017-07-17 DIAGNOSIS — E1122 Type 2 diabetes mellitus with diabetic chronic kidney disease: Secondary | ICD-10-CM | POA: Diagnosis not present

## 2017-07-17 DIAGNOSIS — Z833 Family history of diabetes mellitus: Secondary | ICD-10-CM

## 2017-07-17 DIAGNOSIS — Z9842 Cataract extraction status, left eye: Secondary | ICD-10-CM | POA: Diagnosis not present

## 2017-07-17 DIAGNOSIS — I129 Hypertensive chronic kidney disease with stage 1 through stage 4 chronic kidney disease, or unspecified chronic kidney disease: Secondary | ICD-10-CM | POA: Diagnosis not present

## 2017-07-17 DIAGNOSIS — J440 Chronic obstructive pulmonary disease with acute lower respiratory infection: Secondary | ICD-10-CM | POA: Diagnosis not present

## 2017-07-17 DIAGNOSIS — Z9841 Cataract extraction status, right eye: Secondary | ICD-10-CM

## 2017-07-17 DIAGNOSIS — E785 Hyperlipidemia, unspecified: Secondary | ICD-10-CM | POA: Diagnosis not present

## 2017-07-17 DIAGNOSIS — Z9049 Acquired absence of other specified parts of digestive tract: Secondary | ICD-10-CM

## 2017-07-17 DIAGNOSIS — N179 Acute kidney failure, unspecified: Secondary | ICD-10-CM | POA: Diagnosis present

## 2017-07-17 DIAGNOSIS — J441 Chronic obstructive pulmonary disease with (acute) exacerbation: Secondary | ICD-10-CM

## 2017-07-17 DIAGNOSIS — Z87891 Personal history of nicotine dependence: Secondary | ICD-10-CM

## 2017-07-17 DIAGNOSIS — R0602 Shortness of breath: Secondary | ICD-10-CM

## 2017-07-17 DIAGNOSIS — R911 Solitary pulmonary nodule: Secondary | ICD-10-CM | POA: Diagnosis not present

## 2017-07-17 DIAGNOSIS — Z419 Encounter for procedure for purposes other than remedying health state, unspecified: Secondary | ICD-10-CM | POA: Diagnosis not present

## 2017-07-17 DIAGNOSIS — B955 Unspecified streptococcus as the cause of diseases classified elsewhere: Secondary | ICD-10-CM | POA: Diagnosis present

## 2017-07-17 DIAGNOSIS — Z823 Family history of stroke: Secondary | ICD-10-CM

## 2017-07-17 DIAGNOSIS — Z7951 Long term (current) use of inhaled steroids: Secondary | ICD-10-CM

## 2017-07-17 DIAGNOSIS — R000 Tachycardia, unspecified: Secondary | ICD-10-CM | POA: Diagnosis present

## 2017-07-17 DIAGNOSIS — Z825 Family history of asthma and other chronic lower respiratory diseases: Secondary | ICD-10-CM

## 2017-07-17 DIAGNOSIS — Z800 Family history of malignant neoplasm of digestive organs: Secondary | ICD-10-CM | POA: Diagnosis not present

## 2017-07-17 DIAGNOSIS — J181 Lobar pneumonia, unspecified organism: Secondary | ICD-10-CM | POA: Diagnosis not present

## 2017-07-17 DIAGNOSIS — E860 Dehydration: Secondary | ICD-10-CM | POA: Diagnosis present

## 2017-07-17 DIAGNOSIS — M170 Bilateral primary osteoarthritis of knee: Secondary | ICD-10-CM | POA: Diagnosis present

## 2017-07-17 DIAGNOSIS — R079 Chest pain, unspecified: Secondary | ICD-10-CM | POA: Diagnosis not present

## 2017-07-17 DIAGNOSIS — I444 Left anterior fascicular block: Secondary | ICD-10-CM | POA: Diagnosis not present

## 2017-07-17 LAB — URINALYSIS, ROUTINE W REFLEX MICROSCOPIC
Bilirubin Urine: NEGATIVE
Glucose, UA: NEGATIVE mg/dL
Ketones, ur: NEGATIVE mg/dL
Leukocytes, UA: NEGATIVE
Nitrite: NEGATIVE
Protein, ur: NEGATIVE mg/dL
Specific Gravity, Urine: 1.012 (ref 1.005–1.030)
pH: 5 (ref 5.0–8.0)

## 2017-07-17 LAB — CBC WITH DIFFERENTIAL/PLATELET
Basophils Absolute: 0 10*3/uL (ref 0.0–0.1)
Basophils Relative: 0 %
Eosinophils Absolute: 0.1 10*3/uL (ref 0.0–0.7)
Eosinophils Relative: 0 %
HCT: 40.6 % (ref 39.0–52.0)
Hemoglobin: 14.5 g/dL (ref 13.0–17.0)
Lymphocytes Relative: 15 %
Lymphs Abs: 3.1 10*3/uL (ref 0.7–4.0)
MCH: 27.3 pg (ref 26.0–34.0)
MCHC: 35.7 g/dL (ref 30.0–36.0)
MCV: 76.5 fL — ABNORMAL LOW (ref 78.0–100.0)
Monocytes Absolute: 2.2 10*3/uL — ABNORMAL HIGH (ref 0.1–1.0)
Monocytes Relative: 10 %
Neutro Abs: 15.4 10*3/uL — ABNORMAL HIGH (ref 1.7–7.7)
Neutrophils Relative %: 75 %
Platelets: 222 10*3/uL (ref 150–400)
RBC: 5.31 MIL/uL (ref 4.22–5.81)
RDW: 15.2 % (ref 11.5–15.5)
WBC: 20.8 10*3/uL — ABNORMAL HIGH (ref 4.0–10.5)

## 2017-07-17 LAB — BASIC METABOLIC PANEL
Anion gap: 16 — ABNORMAL HIGH (ref 5–15)
BUN: 24 mg/dL — ABNORMAL HIGH (ref 6–20)
CO2: 21 mmol/L — ABNORMAL LOW (ref 22–32)
Calcium: 9.1 mg/dL (ref 8.9–10.3)
Chloride: 99 mmol/L — ABNORMAL LOW (ref 101–111)
Creatinine, Ser: 1.79 mg/dL — ABNORMAL HIGH (ref 0.61–1.24)
GFR calc Af Amer: 39 mL/min — ABNORMAL LOW (ref >60)
GFR calc non Af Amer: 34 mL/min — ABNORMAL LOW (ref >60)
Glucose, Bld: 129 mg/dL — ABNORMAL HIGH (ref 65–99)
Potassium: 4.1 mmol/L (ref 3.5–5.1)
Sodium: 136 mmol/L (ref 135–145)

## 2017-07-17 LAB — I-STAT CG4 LACTIC ACID, ED
Lactic Acid, Venous: 2.34 mmol/L (ref 0.5–1.9)
Lactic Acid, Venous: 2.59 mmol/L (ref 0.5–1.9)

## 2017-07-17 LAB — LACTIC ACID, PLASMA: Lactic Acid, Venous: 1.2 mmol/L (ref 0.5–1.9)

## 2017-07-17 LAB — BRAIN NATRIURETIC PEPTIDE: B Natriuretic Peptide: 38.3 pg/mL (ref 0.0–100.0)

## 2017-07-17 MED ORDER — ACETAMINOPHEN 325 MG PO TABS: 650.0000 mg | ORAL_TABLET | Freq: Four times a day (QID) | ORAL | Status: DC | PRN

## 2017-07-17 MED ORDER — MOMETASONE FURO-FORMOTEROL FUM 200-5 MCG/ACT IN AERO
2.0000 | INHALATION_SPRAY | Freq: Two times a day (BID) | RESPIRATORY_TRACT | Status: DC
  Administered 2017-07-19 – 2017-07-20 (×3): 2 via RESPIRATORY_TRACT
  Filled 2017-07-17: qty 8.8

## 2017-07-17 MED ORDER — TIOTROPIUM BROMIDE MONOHYDRATE 18 MCG IN CAPS
18.0000 ug | ORAL_CAPSULE | Freq: Every day | RESPIRATORY_TRACT | Status: DC
  Administered 2017-07-19 – 2017-07-20 (×2): 18 ug via RESPIRATORY_TRACT
  Filled 2017-07-17: qty 5

## 2017-07-17 MED ORDER — ALBUTEROL SULFATE (2.5 MG/3ML) 0.083% IN NEBU
2.5000 mg | INHALATION_SOLUTION | Freq: Once | RESPIRATORY_TRACT | Status: AC
  Administered 2017-07-17: 2.5 mg via RESPIRATORY_TRACT

## 2017-07-17 MED ORDER — SODIUM CHLORIDE 0.9 % IV SOLN
500.0000 mg | Freq: Once | INTRAVENOUS | Status: AC
  Administered 2017-07-17: 500 mg via INTRAVENOUS
  Filled 2017-07-17: qty 500

## 2017-07-17 MED ORDER — ENOXAPARIN SODIUM 40 MG/0.4ML ~~LOC~~ SOLN
40.0000 mg | Freq: Every day | SUBCUTANEOUS | Status: DC
  Administered 2017-07-18 – 2017-07-19 (×2): 40 mg via SUBCUTANEOUS
  Filled 2017-07-17 (×4): qty 0.4

## 2017-07-17 MED ORDER — IPRATROPIUM-ALBUTEROL 0.5-2.5 (3) MG/3ML IN SOLN: 3.0000 mL | Freq: Four times a day (QID) | RESPIRATORY_TRACT | Status: DC | PRN

## 2017-07-17 MED ORDER — ACETAMINOPHEN 325 MG PO TABS
650.0000 mg | ORAL_TABLET | Freq: Once | ORAL | Status: AC
  Administered 2017-07-17: 650 mg via ORAL
  Filled 2017-07-17: qty 2

## 2017-07-17 MED ORDER — IPRATROPIUM-ALBUTEROL 0.5-2.5 (3) MG/3ML IN SOLN: 3.0000 mL | Freq: Once | RESPIRATORY_TRACT | Status: DC

## 2017-07-17 MED ORDER — AZITHROMYCIN 500 MG IV SOLR
500.0000 mg | INTRAVENOUS | Status: DC
  Administered 2017-07-18: 500 mg via INTRAVENOUS
  Filled 2017-07-17: qty 500

## 2017-07-17 MED ORDER — ACETAMINOPHEN 650 MG RE SUPP: 650.0000 mg | Freq: Four times a day (QID) | RECTAL | Status: DC | PRN

## 2017-07-17 MED ORDER — SODIUM CHLORIDE 0.9 % IV BOLUS
1000.0000 mL | Freq: Once | INTRAVENOUS | Status: AC
Start: 2017-07-17 — End: 2017-07-17
  Administered 2017-07-17: 1000 mL via INTRAVENOUS

## 2017-07-17 MED ORDER — SODIUM CHLORIDE 0.9 % IV SOLN
INTRAVENOUS | Status: DC
  Administered 2017-07-17 – 2017-07-18 (×2): via INTRAVENOUS

## 2017-07-17 MED ORDER — SODIUM CHLORIDE 0.9 % IV SOLN
1.0000 g | INTRAVENOUS | Status: DC
  Administered 2017-07-18: 1 g via INTRAVENOUS
  Filled 2017-07-17: qty 10

## 2017-07-17 MED ORDER — IPRATROPIUM-ALBUTEROL 0.5-2.5 (3) MG/3ML IN SOLN
3.0000 mL | Freq: Four times a day (QID) | RESPIRATORY_TRACT | Status: DC
  Administered 2017-07-17 – 2017-07-18 (×5): 3 mL via RESPIRATORY_TRACT
  Filled 2017-07-17 (×3): qty 3
  Filled 2017-07-17: qty 30
  Filled 2017-07-17: qty 3

## 2017-07-17 MED ORDER — SODIUM CHLORIDE 0.9 % IV SOLN
1.0000 g | Freq: Once | INTRAVENOUS | Status: AC
  Administered 2017-07-17: 1 g via INTRAVENOUS
  Filled 2017-07-17: qty 10

## 2017-07-17 MED ORDER — IPRATROPIUM BROMIDE 0.02 % IN SOLN
0.5000 mg | Freq: Once | RESPIRATORY_TRACT | Status: AC
  Administered 2017-07-17: 0.5 mg via RESPIRATORY_TRACT

## 2017-07-17 NOTE — ED Notes (Signed)
Patient transported to CT 

## 2017-07-17 NOTE — Progress Notes (Signed)
   [facility] Phone: [phone]  Subjective:  Zyquan is an 84 year old male presenting to clinic with shortness of breath for the last 2-3 days. He is also having cough productive of thick, yellow sputum. He has been using his Albuterol once daily. He endorses chills, but denies fevers. He also endorses fatigue and decreased appetite. No chest pain, no palpitations.  ROS: See HPI for pertinent positives and negatives  Past Medical History- HTN, COPD, T2DM, HLD, allergies.  Family history reviewed for today's visit. No changes.  Social history- patient is a former smoker  Objective: Pulse (!) 125   Temp 98.7 F (37.1 C) (Oral)   Ht 5\' 9"  (1.753 m)   Wt 186 lb (84.4 kg)   SpO2 (!) 87%   BMI 27.47 kg/m  Gen: NAD, alert, cooperative with exam, tired-appearing HEENT: NCAT, EOMI, MMM Neck: FROM, supple CV: RRR, no murmur Resp: Increased work of breathing, breathing through pursed lips, increased RR, decreased air movement throughout all lung fields, no wheezing, no focal crackles Msk: No edema, warm, normal tone, moves UE/LE spontaneously Neuro: Alert and oriented, no gross deficits Skin: No rashes, no lesions  Assessment/Plan: Shortness of Breath: Likely COPD exacerbation given his shortness of breath and cough productive of thick yellow sputum. May have underlying CAP. Patient with desaturations to 87% on ambulation. O2 sats improved to 92% at rest. EKG with sinus tachycardia. Patient given duoneb x 1 with improvement in his symptoms. Will send patient to the ED for further management.   Hyman Bible, MD PGY-3

## 2017-07-17 NOTE — ED Triage Notes (Signed)
Pt arrives from family practice where he had a breathing tx. Pt has been short of breath since Friday. States he feels better after the breathing tx. Denies fevers. Hx of Asthma and COPD.

## 2017-07-17 NOTE — ED Provider Notes (Signed)
MSE was initiated and I personally evaluated the patient and placed orders (if any) at  5:01 PM on Jul 17, 2017.  The patient appears stable so that the remainder of the MSE may be completed by another provider.  .Patient placed in Quick Look pathway, seen and evaluated   Chief Complaint: This of breath  HPI:   Patient sent in by family medicine for shortness of breath.  He has a history of underlying COPD and has been having worsening shortness of breath over the past for 5 days.  He has had associated productive cough and chills.  He was given several breathing treatments prior to arrival by his PCP and noted to be tachycardic to 140.  He has an oral temperature of 99.6.  Denies chest pain.  ROS: Shortness of breath (one)  Physical Exam:   Gen: No distress  Neuro: Awake and Alert  Skin: Warm    Focused Exam: Unable to hear any breath sounds.  Patient is breathing normally and his oxygen saturation has improved with noted hypoxia earlier today at his PCPs office.   Initiation of care has begun. The patient has been counseled on the process, plan, and necessity for staying for the completion/evaluation, and the remainder of the medical screening examination    Margarita Mail, Hershal Coria 07/17/17 [place], Nathan, MD 07/17/17 2355

## 2017-07-17 NOTE — Progress Notes (Signed)
Pharmacy Antibiotic Note  [name] is a 84 y.o. adult admitted on 07/17/2017 with pneumonia.  Pharmacy has been consulted for ceftriaxone dosing. Also with azithro ordered per MD. 1x doses given in the ED already.  Plan: Ceftriaxone 1g IV q24h Azithromycin 500mg  IV q24h F/u clinical progress, LOT Not renally adjusted - Rx will s/o consult  Height: 5\' 11"  (180.3 cm) Weight: 189 lb (85.7 kg) IBW/kg (Calculated) : 75.3  Temp (24hrs), Avg:100.8 F (38.2 C), Min:98.7 F (37.1 C), Max:104.2 F (40.1 C)  Recent Labs  Lab 07/17/17 1710 07/17/17 1808 07/17/17 1827  WBC  --  20.8*  --   CREATININE  --  1.79*  --   LATICACIDVEN 2.34*  --  2.59*    Estimated Creatinine Clearance (by C-G formula based on SCr of 1.79 mg/dL (H)) Male: 29.9 mL/min (A) Male: 34.5 mL/min (A)    No Known Allergies  Elicia Lamp, PharmD, BCPS Clinical Pharmacist 07/17/2017 9:03 PM

## 2017-07-17 NOTE — ED Provider Notes (Signed)
[facility] EMERGENCY DEPARTMENT Provider Note   CSN: [id number] Arrival date & time: 07/17/17  1629     History   Chief Complaint Chief Complaint  Patient presents with  . Shortness of Breath    HPI [name] is a 84 y.o. adult.  HPI Patient presents for family practice office with shortness of breath.  Comes with cough and fever.  Seen in the office today tachycardic.  History of COPD but not on oxygen.  Has had green sputum.  No real chest pain.  States it feels much better now than he did earlier today. Past Medical History:  Diagnosis Date  . ANEMIA, IRON DEF, NOS 01/15/2007   Qualifier: Diagnosis of  By: Jeannine Kitten MD, Rodman Key    . Arthritis   . Cataract extraction status of left eye 2016  . Cataract extraction status of right eye 2016  . CHOLECYSTECTOMY, LAPAROSCOPIC, HX OF 04/12/2007   Qualifier: Diagnosis of  By: Netty Starring  MD, Lucianne Muss    . Gout 2007  . Hypertension     Patient Active Problem List   Diagnosis Date Noted  . Shortness of breath 07/17/2017  . CAP (community acquired pneumonia) 07/17/2017  . Seasonal allergies 06/30/2017  . Toxicodendron dermatitis 08/23/2016  . Osteoarthritis of knees, bilateral 11/16/2015  . COPD exacerbation ([place]) 01/26/2015  . Left hip pain 12/12/2014  . Hoarseness 08/13/2014  . Muscle strain 08/13/2014  . Left foot pain 06/09/2014  . Pain of left heel 05/27/2014  . Dry skin dermatitis 04/16/2014  . Dyslipidemia 04/16/2014  . History of anemia 04/07/2014  . Type 2 diabetes mellitus ([place]) 03/21/2014  . Abnormal AST and ALT 02/25/2014  . Sacral nerve root compression 06/07/2011  . Herniated nucleus pulposus, L4-5 12/24/2010  . Essential hypertension 05/07/2010  . COPD 03/20/2007  . Gout 01/15/2007  . Asthma 01/15/2007    Past Surgical History:  Procedure Laterality Date  . CHOLECYSTECTOMY       OB History   None      Home Medications    Prior to Admission medications   Medication Sig  Start Date End Date Taking? Authorizing Provider  acetaminophen (TYLENOL) 325 MG tablet Take 2 tablets (650 mg total) by mouth every 6 (six) hours as needed for moderate pain. 06/09/15  Yes Patrecia Pour, MD  albuterol (PROVENTIL HFA;VENTOLIN HFA) 108 (90 Base) MCG/ACT inhaler Inhale 2 puffs into the lungs every 6 (six) hours as needed for wheezing or shortness of breath. 03/08/17  Yes Eloise Levels, MD  allopurinol (ZYLOPRIM) 300 MG tablet Take 1 tablet (300 mg total) by mouth daily. 03/08/17  Yes Eloise Levels, MD  cetirizine (ZYRTEC) 10 MG tablet Take 1 tablet (10 mg total) by mouth daily. 06/30/17  Yes Guadalupe Dawn, MD  diclofenac sodium (VOLTAREN) 1 % GEL APPLY 2 GRAMS TO AFFECTED AREA 4 TIMES A DAY 07/11/17  Yes Guadalupe Dawn, MD  Fluticasone-Salmeterol (ADVAIR DISKUS) 500-50 MCG/DOSE AEPB INHALE 1 PUFF INTO THE LUNGS 2 TIMES DAILY 02/09/17  Yes Eloise Levels, MD  tiotropium (SPIRIVA HANDIHALER) 18 MCG inhalation capsule PLACE 1 CAPSULE (18 MCG TOTAL) INTO INHALER AND INHALE DAILY. 03/08/17  Yes Eloise Levels, MD  glucose blood (ONE TOUCH ULTRA TEST) test strip Use as instructed.  Dispense:   Quantity sufficient for one month supply of once daily testing. Patient not taking: Reported on 11/10/2015 04/01/14   Zenia Resides, MD  Lancets [facility] ULTRASOFT) lancets Use as instructed.  Dispense Quantity Sufficient for  one month supply of once daily testing. Patient not taking: Reported on 11/10/2015 04/01/14   Zenia Resides, MD  triamcinolone ointment (KENALOG) 0.1 % Apply 1 application topically 2 (two) times daily. Patient not taking: Reported on 07/17/2017 08/23/16   McKeag, Marylynn Pearson, MD    Family History Family History  Problem Relation Age of Onset  . Asthma Mother   . Heart disease Brother   . Stroke Brother   . Diabetes Brother   . Cancer Brother        colon  . Arthritis Sister   . Arthritis Daughter   . Stroke Brother   . Heart disease Brother   . Alcohol abuse Sister   .  Cirrhosis Sister   . Asthma Sister   . Diabetes Son   . Asthma Daughter     Social History Social History   Tobacco Use  . Smoking status: Former Smoker    Packs/day: 0.50    Years: 60.00    Pack years: 30.00    Types: Cigars, Cigarettes    Start date: 03/08/1949    Last attempt to quit: 06/08/2011    Years since quitting: 6.1  . Smokeless tobacco: Never Used  Substance Use Topics  . Alcohol use: No  . Drug use: No     Allergies   Patient has no known allergies.   Review of Systems Review of Systems  Constitutional: Positive for appetite change and fever.  HENT: Negative for congestion.   Respiratory: Positive for cough and shortness of breath.   Cardiovascular: Positive for chest pain.  Gastrointestinal: Negative for abdominal pain.  Genitourinary: Negative for flank pain.  Musculoskeletal: Negative for back pain.  Skin: Negative for rash.  Neurological: Positive for weakness.  Hematological: Negative for adenopathy.  Psychiatric/Behavioral: Negative for confusion.     Physical Exam Updated Vital Signs BP 115/69   Pulse (!) 113   Temp (!) 102 F (38.9 C) (Rectal)   Resp 16   Ht 5\' 11"  (1.803 m)   Wt 85.7 kg (189 lb)   SpO2 93%   BMI 26.36 kg/m   Physical Exam  Constitutional: He appears well-developed.  HENT:  Head: Normocephalic.  Eyes: Pupils are equal, round, and reactive to light.  Cardiovascular:  Tachycardia  Pulmonary/Chest:  Tachypnea.  Some dyspnea and pursed lip breathing.  Harsh breath sounds bilateral bases.  Abdominal: There is no tenderness.  Musculoskeletal:       Right lower leg: He exhibits no edema.       Left lower leg: He exhibits no edema.  Neurological: He is alert.  Skin: Skin is warm. Capillary refill takes less than 2 seconds.  Psychiatric: He has a normal mood and affect.     ED Treatments / Results  Labs (all labs ordered are listed, but only abnormal results are displayed) Labs Reviewed  CBC WITH  DIFFERENTIAL/PLATELET - Abnormal; Notable for the following components:      Result Value   WBC 20.8 (*)    MCV 76.5 (*)    Neutro Abs 15.4 (*)    Monocytes Absolute 2.2 (*)    All other components within normal limits  BASIC METABOLIC PANEL - Abnormal; Notable for the following components:   Chloride 99 (*)    CO2 21 (*)    Glucose, Bld 129 (*)    BUN 24 (*)    Creatinine, Ser 1.79 (*)    GFR calc non Af Amer 34 (*)    GFR calc Af  Amer 39 (*)    Anion gap 16 (*)    All other components within normal limits  I-STAT CG4 LACTIC ACID, ED - Abnormal; Notable for the following components:   Lactic Acid, Venous 2.34 (*)    All other components within normal limits  I-STAT CG4 LACTIC ACID, ED - Abnormal; Notable for the following components:   Lactic Acid, Venous 2.59 (*)    All other components within normal limits  CULTURE, BLOOD (ROUTINE X 2)  CULTURE, BLOOD (ROUTINE X 2)  BRAIN NATRIURETIC PEPTIDE  URINALYSIS, ROUTINE W REFLEX MICROSCOPIC    EKG EKG Interpretation  Date/Time:  Monday Jul 17 2017 16:37:58 EDT Ventricular Rate:  139 PR Interval:  160 QRS Duration: 80 QT Interval:  264 QTC Calculation: 401 R Axis:   31 Text Interpretation:  Sinus tachycardia Otherwise normal ECG Confirmed by Davonna Belling [phone]) on 07/17/2017 5:41:54 PM   Radiology Dg Chest 2 View  Result Date: 07/17/2017 CLINICAL DATA:  Pt is very SOB since fri and has copd says he quit smoking 15years ago. EXAM: CHEST - 2 VIEW COMPARISON:  Chest x-ray dated 02/17/2014. chest CT dated 11/02/2015. FINDINGS: Ill-defined opacities at the LEFT lung base, most likely pneumonia. Lungs are hyperexpanded. Chronic bronchitic changes noted centrally. Chronic scarring/fibrosis noted at the lung bases. Possible small nodular density within the RIGHT upper lung. Heart size and mediastinal contours are stable. No acute or suspicious osseous finding. IMPRESSION: 1. Ill-defined opacity at the LEFT lung base, pneumonia  versus asymmetric edema, favor pneumonia. 2. Probable small nodular density within the RIGHT upper lobe, suspicious for new pulmonary nodule. Recommend chest CT for further characterization. 3. Hyperexpanded lungs indicating COPD. Associated chronic bronchitic changes. Electronically Signed   By: Franki Cabot M.D.   On: 07/17/2017 17:23    Procedures Procedures (including critical care time)  Medications Ordered in ED Medications  acetaminophen (TYLENOL) tablet 650 mg (650 mg Oral Given 07/17/17 1807)  cefTRIAXone (ROCEPHIN) 1 g in sodium chloride 0.9 % 100 mL IVPB (0 g Intravenous Stopped 07/17/17 1904)  azithromycin (ZITHROMAX) 500 mg in sodium chloride 0.9 % 250 mL IVPB (500 mg Intravenous New Bag/Given 07/17/17 1853)  sodium chloride 0.9 % bolus 1,000 mL (0 mLs Intravenous Stopped 07/17/17 1910)  sodium chloride 0.9 % bolus 1,000 mL (1,000 mLs Intravenous New Bag/Given 07/17/17 1903)     Initial Impression / Assessment and Plan / ED Course  I have reviewed the triage vital signs and the nursing notes.  Pertinent labs & imaging results that were available during my care of the patient were reviewed by me and considered in my medical decision making (see chart for details).     Patient with tachycardia and shortness of breath.  Pneumonia on x-ray.  High fever but lactic acid still below 4.  Not hypotensive.  Creatinine somewhat increased.  Just will admit to family practice residents.  Fluid bolus given and started on empiric antibiotics.  Heart rate has improved with fluid bolus. Final Clinical Impressions(s) / ED Diagnoses   Final diagnoses:  Community acquired pneumonia, unspecified laterality  Sepsis, due to unspecified organism [facility])    ED Discharge Orders    None       Davonna Belling, MD 07/17/17 2024

## 2017-07-17 NOTE — Discharge Summary (Signed)
[facility] Discharge Summary  Patient name: [name] Medical record number: [id number] Date of birth: 08/13/1933 Age: 84 y.o. Gender: adult Date of Admission: 07/17/2017  Date of Discharge: 07/20/17 Admitting Physician: Sherene Sires, DO  Primary Care Provider: Guadalupe Dawn, MD Consultants:   Indication for Hospitalization: sepsis with CAP  Discharge Diagnoses/Problem List:  CAP Sepsis Cavitary lesions on lung CT AKI (resolved) on CKD2 T2DM COPD  Disposition: to home  Discharge Condition: stable  Discharge Exam: General: NAD, pleasant Eyes: PERRL, EOMI, no conjunctival pallor or injection ENTM: Moist mucous membranes, no pharyngeal erythema or exudate Neck: Supple, no LAD Cardiovascular: RRR, no m/r/g, no LE edema Respiratory: CTA BL, normal work of breathing, no wheezes/crackles Gastrointestinal: soft, nontender, nondistended, normoactive BS MSK: moves 4 extremities equally Derm: no rashes appreciated Neuro: CN II-XII grossly intact Psych: AOx3, appropriate affect  Brief Hospital Course:  Patient presented with  fever to 102, and with tachypnia, tachycardia.  BP was stable and he did not have an O2 requirement.   He was started on antibiotics and given 2L bolus in the ED.  CT showed lower lobe pneumonia and bilateral upper cavitary lesions. These will need f/u CT to evaluate resolution.  He steadily improved over the next 48hrs and was transitioned to oral meds with plans to complete a 14day course of abx coverage per pulm.  He also had mild AKI, thought to be dehydration related but that resolved promptly as well.  Issues for Follow Up:  1.  Chest CT f/u: caviatary lesions bilaterally in upper lobes, recommend repeat CT in ~66month.  If they are unresolved, could need bronch. 2.  Plan was for a 14day course of abx.  Patient was d/c'd on azithromycin and omnicef with plans to continue those meds until 5/26 per Pulm.  Significant  Procedures:   Significant Labs and Imaging:  Recent Labs  Lab 07/18/17 0208 07/19/17 0917 07/20/17 0234  WBC 18.1* 13.2* 13.6*  HGB 12.0* 11.0* 11.1*  HCT 34.8* 32.0* 32.2*  PLT 162 213 255   Recent Labs  Lab 07/17/17 1808 07/18/17 0208 07/19/17 0917 07/20/17 0234  NA 136 137 137 137  K 4.1 4.2 3.9 3.8  CL 99* 106 108 107  CO2 21* 18* 21* 20*  GLUCOSE 129* 162* 165* 114*  BUN 24* 24* 13 14  CREATININE 1.79* 1.59* 1.31* 1.26*  CALCIUM 9.1 7.9* 8.4* 8.8*  ALKPHOS  --  70  --   --   AST  --  29  --   --   ALT  --  26  --   --   ALBUMIN  --  2.8*  --   --     Dg Chest 2 View  Result Date: 07/17/2017 CLINICAL DATA:  Pt is very SOB since fri and has copd says he quit smoking 15years ago. EXAM: CHEST - 2 VIEW COMPARISON:  Chest x-ray dated 02/17/2014. chest CT dated 11/02/2015. FINDINGS: Ill-defined opacities at the LEFT lung base, most likely pneumonia. Lungs are hyperexpanded. Chronic bronchitic changes noted centrally. Chronic scarring/fibrosis noted at the lung bases. Possible small nodular density within the RIGHT upper lung. Heart size and mediastinal contours are stable. No acute or suspicious osseous finding. IMPRESSION: 1. Ill-defined opacity at the LEFT lung base, pneumonia versus asymmetric edema, favor pneumonia. 2. Probable small nodular density within the RIGHT upper lobe, suspicious for new pulmonary nodule. Recommend chest CT for further characterization. 3. Hyperexpanded lungs indicating COPD. Associated chronic bronchitic changes. Electronically Signed  By: Franki Cabot M.D.   On: 07/17/2017 17:23   Ct Chest Wo Contrast  Result Date: 07/17/2017 CLINICAL DATA:  Shortness of breath for 2-3 days, thick productive cough, chills and fatigue. EXAM: CT CHEST WITHOUT CONTRAST TECHNIQUE: Multidetector CT imaging of the chest was performed following the standard protocol without IV contrast. COMPARISON:  Chest CT dated 11/02/2015. FINDINGS: Cardiovascular: Heart size is  normal. No pericardial effusion. No thoracic aortic aneurysm. Mild aortic atherosclerosis. Mediastinum/Nodes: Scattered small lymph nodes within the mediastinum, several borderline enlarged lymph nodes measuring up to 9 mm short axis dimension, likely reactive in nature. Esophagus is unremarkable. Trachea and central bronchi are unremarkable. Lungs/Pleura: Patchy dense consolidations throughout the LEFT lower lobe. Additional scattered smaller consolidations within the inferior portion of the LEFT upper lobe, some with central cavitation. 8 mm nodular consolidation within the more superior aspects of the LEFT upper lobe, with central cavitation. Single small nodular consolidation within the RIGHT upper lobe, measuring 12 mm greatest dimension. Mild scarring/atelectasis at the RIGHT lung base. Upper Abdomen: No acute abnormality. Musculoskeletal: No acute or suspicious osseous finding. IMPRESSION: 1. Patchy dense consolidations throughout the LEFT lower lobe, consistent with pneumonia. 2. Additional smaller consolidations within the LEFT upper lobe and RIGHT upper lobe, with suggestion of central cavitation, raising the possibility of atypical pneumonia such as fungal or viral. Recommend follow-up chest CT after appropriate treatment to ensure resolution. 3. Scattered small lymph nodes within the mediastinum, likely reactive in nature. Aortic Atherosclerosis (ICD10-I70.0). Electronically Signed   By: Franki Cabot M.D.   On: 07/17/2017 22:32    Results/Tests Pending at Time of Discharge:   Discharge Medications:  Allergies as of 07/20/2017   No Known Allergies     Medication List    TAKE these medications   acetaminophen 325 MG tablet Commonly known as:  TYLENOL Take 2 tablets (650 mg total) by mouth every 6 (six) hours as needed for moderate pain.   albuterol 108 (90 Base) MCG/ACT inhaler Commonly known as:  PROVENTIL HFA;VENTOLIN HFA Inhale 2 puffs into the lungs every 6 (six) hours as needed for  wheezing or shortness of breath.   allopurinol 300 MG tablet Commonly known as:  ZYLOPRIM Take 1 tablet (300 mg total) by mouth daily.   azithromycin 250 MG tablet Commonly known as:  ZITHROMAX Take 1 tablet (250 mg total) by mouth daily for 10 days. Take 1 tablet (250mg ) daily until 5/26   cefdinir 300 MG capsule Commonly known as:  OMNICEF Take 1 capsule (300 mg total) by mouth every 12 (twelve) hours for 10 days.   cetirizine 10 MG tablet Commonly known as:  ZYRTEC Take 1 tablet (10 mg total) by mouth daily.   diclofenac sodium 1 % Gel Commonly known as:  VOLTAREN APPLY 2 GRAMS TO AFFECTED AREA 4 TIMES A DAY   Fluticasone-Salmeterol 500-50 MCG/DOSE Aepb Commonly known as:  ADVAIR DISKUS INHALE 1 PUFF INTO THE LUNGS 2 TIMES DAILY   glucose blood test strip Commonly known as:  ONE TOUCH ULTRA TEST Use as instructed.  Dispense:   Quantity sufficient for one month supply of once daily testing.   onetouch ultrasoft lancets Use as instructed.  Dispense Quantity Sufficient for one month supply of once daily testing.   tiotropium 18 MCG inhalation capsule Commonly known as:  SPIRIVA HANDIHALER PLACE 1 CAPSULE (18 MCG TOTAL) INTO INHALER AND INHALE DAILY.   triamcinolone ointment 0.1 % Commonly known as:  KENALOG Apply 1 application topically 2 (two) times daily.  Discharge Instructions: Please refer to Patient Instructions section of EMR for full details.  Patient was counseled important signs and symptoms that should prompt return to medical care, changes in medications, dietary instructions, activity restrictions, and follow up appointments.   Follow-Up Appointments:   Sherene Sires, DO 07/21/2017, 2:04 PM PGY-1, [place]

## 2017-07-17 NOTE — H&P (Addendum)
[facility] Admission History and Physical Service Pager: [phone]  Patient name: [name] Medical record number: [id number] Date of birth: [birth year] Age: 84 y.o. Gender: adult  Primary Care Provider: Guadalupe Dawn, MD Consultants: None  Code Status: Partial Code, No intubation   Chief Complaint: CAP  Assessment and Plan: [name] is a 84 y.o. adult presenting with CAP. PMH is significant for COPD, T2DM, gout, HTN, HLD  CAP: Febrile to Tmax 104, WBC 20.8, Tachycardic to 115 with likely source of pneumonia -even symptoms and a chest x-ray with left lung base opacity.  Currently on  room air, sat 95.  physical exam with some rhonchi.  - Attending Dr. Nori Riis, stepdown  - Blood cultures pending  - Continue IV azithromycin and ceftriaxone  - Duonebs every 6 hours  - Home COPD regiment of Spiriva and Dulera  - Continuous pulse oximetry  - vitals per floor  - Trend LA  - Legionella + Strep pneumonia Antigen - Sputum culture   Pulmonary Nodule -  CT chest ordered  AKI :Baseline SCR 1.3; SCR 1.79, Likely prerenal, due to decreased fluid intake  - BMET  - NS @ 125 ml   T2DM : A1C 6.8  - Diet controlled   COPD: 50 year pack hx  - Continue Spiriva and Dulera  - Duonebs every 6 hours   FEN/GI:Carb modified diet  Prophylaxis: Lovenox   Disposition: PT/OT pending home   History of Present Illness:  [name] is a 84 y.o. adult presenting with  SOB  for the past  3 days,since Friday. Patient states he had chills,terrible cough,fever. Patient had decreased appetite.  He states he has had a productive cough of thick yellow sputum. He also endorses fatigue and decreased appetite. No wheezing.  He has had decreased fluid intake.  He has had chicken soup on Sunday and on Saturday only breakfast.  He came into the [facility] clinic to be evaluated today by Dr. male and was told to go to the ED. Apparently patient had desatted while  ambulating to 87% in the clinic.  Review Of Systems: Per HPI with the following additions:None  Review of Systems  Constitutional: Positive for chills and fever.  HENT: Negative for congestion, sinus pain and sore throat.   Eyes: Negative for blurred vision and double vision.  Respiratory: Positive for cough, sputum production and shortness of breath. Negative for wheezing.   Cardiovascular: Negative for chest pain, palpitations and leg swelling.  Gastrointestinal: Negative for nausea and vomiting.  Genitourinary: Negative for dysuria, frequency and urgency.  Skin: Negative for rash.  Neurological: Positive for dizziness. Negative for focal weakness.    Patient Active Problem List   Diagnosis Date Noted  . Shortness of breath 07/17/2017  . CAP (community acquired pneumonia) 07/17/2017  . Seasonal allergies 06/30/2017  . Toxicodendron dermatitis 08/23/2016  . Osteoarthritis of knees, bilateral 11/16/2015  . COPD exacerbation ([place]) 01/26/2015  . Left hip pain 12/12/2014  . Hoarseness 08/13/2014  . Muscle strain 08/13/2014  . Left foot pain 06/09/2014  . Pain of left heel 05/27/2014  . Dry skin dermatitis 04/16/2014  . Dyslipidemia 04/16/2014  . History of anemia 04/07/2014  . Type 2 diabetes mellitus ([place]) 03/21/2014  . Abnormal AST and ALT 02/25/2014  . Sacral nerve root compression 06/07/2011  . Herniated nucleus pulposus, L4-5 12/24/2010  . Essential hypertension 05/07/2010  . COPD 03/20/2007  . Gout 01/15/2007  . Asthma 01/15/2007    Past Medical History: Past Medical  History:  Diagnosis Date  . ANEMIA, IRON DEF, NOS 01/15/2007   Qualifier: Diagnosis of  By: Jeannine Kitten MD, Rodman Key    . Arthritis   . Cataract extraction status of left eye 2016  . Cataract extraction status of right eye 2016  . CHOLECYSTECTOMY, LAPAROSCOPIC, HX OF 04/12/2007   Qualifier: Diagnosis of  By: Netty Starring  MD, Lucianne Muss    . Gout 2007  . Hypertension     Past Surgical History: Past Surgical  History:  Procedure Laterality Date  . CHOLECYSTECTOMY      Social History: Social History   Tobacco Use  . Smoking status: Former Smoker    Packs/day: 0.50    Years: 60.00    Pack years: 30.00    Types: Cigars, Cigarettes    Start date: 03/08/1951    Last attempt to quit: 06/08/2011    Years since quitting: 6.1  . Smokeless tobacco: Never Used  Substance Use Topics  . Alcohol use: No  . Drug use: No   Additional social history: Please also refer to relevant sections of EMR.  Family History: Family History  Problem Relation Age of Onset  . Asthma Mother   . Heart disease Brother   . Stroke Brother   . Diabetes Brother   . Cancer Brother        colon  . Arthritis Sister   . Arthritis Daughter   . Stroke Brother   . Heart disease Brother   . Alcohol abuse Sister   . Cirrhosis Sister   . Asthma Sister   . Diabetes Son   . Asthma Daughter     Allergies and Medications: No Known Allergies Current Facility-Administered Medications on File Prior to Encounter  Medication Dose Route Frequency Provider Last Rate Last Dose  . methylPREDNISolone acetate (DEPO-MEDROL) injection 40 mg  40 mg Intramuscular Once Eloise Levels, MD       Current Outpatient Medications on File Prior to Encounter  Medication Sig Dispense Refill  . acetaminophen (TYLENOL) 325 MG tablet Take 2 tablets (650 mg total) by mouth every 6 (six) hours as needed for moderate pain. 60 tablet 1  . albuterol (PROVENTIL HFA;VENTOLIN HFA) 108 (90 Base) MCG/ACT inhaler Inhale 2 puffs into the lungs every 6 (six) hours as needed for wheezing or shortness of breath. 1 Inhaler 3  . allopurinol (ZYLOPRIM) 300 MG tablet Take 1 tablet (300 mg total) by mouth daily. 30 tablet 6  . cetirizine (ZYRTEC) 10 MG tablet Take 1 tablet (10 mg total) by mouth daily. 30 tablet 0  . diclofenac sodium (VOLTAREN) 1 % GEL APPLY 2 GRAMS TO AFFECTED AREA 4 TIMES A DAY 400 g 0  . Fluticasone-Salmeterol (ADVAIR DISKUS) 500-50 MCG/DOSE  AEPB INHALE 1 PUFF INTO THE LUNGS 2 TIMES DAILY 60 each 3  . tiotropium (SPIRIVA HANDIHALER) 18 MCG inhalation capsule PLACE 1 CAPSULE (18 MCG TOTAL) INTO INHALER AND INHALE DAILY. 30 capsule 6  . glucose blood (ONE TOUCH ULTRA TEST) test strip Use as instructed.  Dispense:   Quantity sufficient for one month supply of once daily testing. (Patient not taking: Reported on 11/10/2015) 100 each 12  . Lancets (ONETOUCH ULTRASOFT) lancets Use as instructed.  Dispense Quantity Sufficient for one month supply of once daily testing. (Patient not taking: Reported on 11/10/2015) 100 each 12  . triamcinolone ointment (KENALOG) 0.1 % Apply 1 application topically 2 (two) times daily. (Patient not taking: Reported on 07/17/2017) 80 g 1    Objective: BP 115/69   Pulse Marland Kitchen)  113   Temp (!) 102 F (38.9 C) (Rectal)   Resp 16   Ht 5\' 11"  (1.803 m)   Wt 189 lb (85.7 kg)   SpO2 93%   BMI 26.36 kg/m  Exam: Physical Exam  Constitutional: He is oriented to person, place, and time. He appears well-developed and well-nourished.  HENT:  Head: Normocephalic and atraumatic.  Eyes: Pupils are equal, round, and reactive to light.  Neck: Normal range of motion. Neck supple.  Cardiovascular: Regular rhythm and normal heart sounds. Exam reveals no gallop.  No murmur heard. Slightly tachycardic   Pulmonary/Chest: Effort normal.  Notable for rhonchi, no wheezing, decreased breath sounds on left compared to right   Abdominal: Soft. Bowel sounds are normal.  Musculoskeletal: Normal range of motion.       Right lower leg: Normal.       Left lower leg: Normal.  Neurological: He is alert and oriented to person, place, and time.  Skin: Skin is warm and dry.  Psychiatric: He has a normal mood and affect. His behavior is normal.    Labs and Imaging: CBC BMET  Recent Labs  Lab 07/17/17 1808  WBC 20.8*  HGB 14.5  HCT 40.6  PLT 222   Recent Labs  Lab 07/17/17 1808  NA 136  K 4.1  CL 99*  CO2 21*  BUN 24*   CREATININE 1.79*  GLUCOSE 129*  CALCIUM 9.1      Dg Chest 2 View  Result Date: 07/17/2017 CLINICAL DATA:  Pt is very SOB since fri and has copd says he quit smoking 15years ago. EXAM: CHEST - 2 VIEW COMPARISON:  Chest x-ray dated 02/17/2014. chest CT dated 11/02/2015. FINDINGS: Ill-defined opacities at the LEFT lung base, most likely pneumonia. Lungs are hyperexpanded. Chronic bronchitic changes noted centrally. Chronic scarring/fibrosis noted at the lung bases. Possible small nodular density within the RIGHT upper lung. Heart size and mediastinal contours are stable. No acute or suspicious osseous finding. IMPRESSION: 1. Ill-defined opacity at the LEFT lung base, pneumonia versus asymmetric edema, favor pneumonia. 2. Probable small nodular density within the RIGHT upper lobe, suspicious for new pulmonary nodule. Recommend chest CT for further characterization. 3. Hyperexpanded lungs indicating COPD. Associated chronic bronchitic changes. Electronically Signed   By: Franki Cabot M.D.   On: 07/17/2017 17:23   EKG wnl  Tonette Bihari, MD 07/17/2017, 8:16 PM PGY-3, [place] Intern pager: [phone], text pages welcome

## 2017-07-17 NOTE — ED Notes (Signed)
Anna-RN at triage and Chad-CN notified of elevated Cg-4

## 2017-07-17 NOTE — Assessment & Plan Note (Signed)
Likely COPD exacerbation given his shortness of breath and cough productive of thick yellow sputum. May have underlying CAP. Patient with desaturations to 87% on ambulation. O2 sats improved to 92% at rest. EKG with sinus tachycardia. Patient given duoneb x 1 with improvement in his symptoms. Will send patient to the ED for further management.

## 2017-07-18 ENCOUNTER — Encounter (HOSPITAL_COMMUNITY): Payer: Self-pay

## 2017-07-18 ENCOUNTER — Other Ambulatory Visit: Payer: Self-pay

## 2017-07-18 DIAGNOSIS — J189 Pneumonia, unspecified organism: Secondary | ICD-10-CM | POA: Diagnosis present

## 2017-07-18 LAB — STREP PNEUMONIAE URINARY ANTIGEN: Strep Pneumo Urinary Antigen: NEGATIVE

## 2017-07-18 LAB — HEPATIC FUNCTION PANEL
ALT: 26 U/L (ref 17–63)
AST: 29 U/L (ref 15–41)
Albumin: 2.8 g/dL — ABNORMAL LOW (ref 3.5–5.0)
Alkaline Phosphatase: 70 U/L (ref 38–126)
Bilirubin, Direct: 0.5 mg/dL (ref 0.1–0.5)
Indirect Bilirubin: 0.6 mg/dL (ref 0.3–0.9)
Total Bilirubin: 1.1 mg/dL (ref 0.3–1.2)
Total Protein: 7.2 g/dL (ref 6.5–8.1)

## 2017-07-18 LAB — LACTIC ACID, PLASMA: Lactic Acid, Venous: 1.2 mmol/L (ref 0.5–1.9)

## 2017-07-18 LAB — CBC
HCT: 34.8 % — ABNORMAL LOW (ref 39.0–52.0)
Hemoglobin: 12 g/dL — ABNORMAL LOW (ref 13.0–17.0)
MCH: 26.5 pg (ref 26.0–34.0)
MCHC: 34.5 g/dL (ref 30.0–36.0)
MCV: 76.8 fL — ABNORMAL LOW (ref 78.0–100.0)
Platelets: 162 10*3/uL (ref 150–400)
RBC: 4.53 MIL/uL (ref 4.22–5.81)
RDW: 15.2 % (ref 11.5–15.5)
WBC: 18.1 10*3/uL — ABNORMAL HIGH (ref 4.0–10.5)

## 2017-07-18 LAB — BASIC METABOLIC PANEL
Anion gap: 13 (ref 5–15)
BUN: 24 mg/dL — ABNORMAL HIGH (ref 6–20)
CO2: 18 mmol/L — ABNORMAL LOW (ref 22–32)
Calcium: 7.9 mg/dL — ABNORMAL LOW (ref 8.9–10.3)
Chloride: 106 mmol/L (ref 101–111)
Creatinine, Ser: 1.59 mg/dL — ABNORMAL HIGH (ref 0.61–1.24)
GFR calc Af Amer: 45 mL/min — ABNORMAL LOW (ref >60)
GFR calc non Af Amer: 39 mL/min — ABNORMAL LOW (ref >60)
Glucose, Bld: 162 mg/dL — ABNORMAL HIGH (ref 65–99)
Potassium: 4.2 mmol/L (ref 3.5–5.1)
Sodium: 137 mmol/L (ref 135–145)

## 2017-07-18 MED ORDER — GUAIFENESIN-DM 100-10 MG/5ML PO SYRP
5.0000 mL | ORAL_SOLUTION | ORAL | Status: DC | PRN
Start: 2017-07-18 — End: 2017-07-20

## 2017-07-18 MED ORDER — IPRATROPIUM-ALBUTEROL 0.5-2.5 (3) MG/3ML IN SOLN: 3.0000 mL | RESPIRATORY_TRACT | Status: DC | PRN

## 2017-07-18 NOTE — Evaluation (Signed)
Physical Therapy Evaluation Patient Details Name: [name] MRN: [id number] DOB: 1933/12/16 Today's Date: 07/18/2017   History of Present Illness  Pt is an 84 y/o male admitted secondary to worsening SOB. CT imaging revealed L lower and upper, and R upper lobe consolidations consistent with pneumonia. PMH includes COPD, asthma, HTN, gout, and DM.   Clinical Impression  Pt admitted secondary to problem above with deficits below. Pt presenting with mild unsteadiness and SOB during ambulation. Required min guard A for mobility. Oxygen sats from 93%-95% on RA throughout; DOE at 2/4. Anticipate pt will progress well once SOB improved. Will continue to follow acutely to maximize functional mobility independence and safety.     Follow Up Recommendations No PT follow up;Supervision for mobility/OOB    Equipment Recommendations  None recommended by PT    Recommendations for Other Services       Precautions / Restrictions Precautions Precautions: None Restrictions Weight Bearing Restrictions: No      Mobility  Bed Mobility Overal bed mobility: Modified Independent                Transfers Overall transfer level: Needs assistance Equipment used: None Transfers: Sit to/from Stand Sit to Stand: Min assist         General transfer comment: Min A for steadying assist to stand.   Ambulation/Gait Ambulation/Gait assistance: Min guard Ambulation Distance (Feet): 20 Feet Assistive device: None Gait Pattern/deviations: Step-through pattern;Decreased stride length Gait velocity: Decreased    General Gait Details: Slow, mildly unsteady gait requiring min guard for safety. Distance limited to within the room as pt in ED and attached to monitors. Pt presenting with some SOB during short ambulation distance; Oxygen sats at 93-95% on RA throughout.   Stairs            Wheelchair Mobility    Modified Rankin (Stroke Patients Only)       Balance Overall balance  assessment: Needs assistance Sitting-balance support: No upper extremity supported;Feet supported Sitting balance-Leahy Scale: Good     Standing balance support: No upper extremity supported;During functional activity Standing balance-Leahy Scale: Fair                               Pertinent Vitals/Pain Pain Assessment: No/denies pain    Home Living Family/patient expects to be discharged to:: Private residence Living Arrangements: Spouse/significant other Available Help at Discharge: Family;Available 24 hours/day Type of Home: House Home Access: Stairs to enter Entrance Stairs-Rails: Psychiatric nurse of Steps: 5-6 Home Layout: Two level Home Equipment: Walker - 2 wheels;Shower seat      Prior Function Level of Independence: Independent         Comments: Was still mowing the yard.      Hand Dominance   Dominant Hand: Right    Extremity/Trunk Assessment   Upper Extremity Assessment Upper Extremity Assessment: Overall WFL for tasks assessed    Lower Extremity Assessment Lower Extremity Assessment: Overall WFL for tasks assessed    Cervical / Trunk Assessment Cervical / Trunk Assessment: Normal  Communication   Communication: HOH  Cognition Arousal/Alertness: Awake/alert Behavior During Therapy: WFL for tasks assessed/performed Overall Cognitive Status: Within Functional Limits for tasks assessed                                        General Comments  Exercises     Assessment/Plan    PT Assessment Patient needs continued PT services  PT Problem List Cardiopulmonary status limiting activity;Decreased balance;Decreased activity tolerance;Decreased mobility;Decreased knowledge of precautions       PT Treatment Interventions DME instruction;Gait training;Stair training;Therapeutic activities;Functional mobility training;Therapeutic exercise;Balance training;Patient/family education    PT Goals (Current  goals can be found in the Care Plan section)  Acute Rehab PT Goals Patient Stated Goal: to go home  PT Goal Formulation: With patient Time For Goal Achievement: 08/01/17 Potential to Achieve Goals: Good    Frequency Min 3X/week   Barriers to discharge        Co-evaluation               AM-PAC PT "6 Clicks" Daily Activity  Outcome Measure Difficulty turning over in bed (including adjusting bedclothes, sheets and blankets)?: None Difficulty moving from lying on back to sitting on the side of the bed? : A Little Difficulty sitting down on and standing up from a chair with arms (e.g., wheelchair, bedside commode, etc,.)?: Unable Help needed moving to and from a bed to chair (including a wheelchair)?: A Little Help needed walking in hospital room?: A Little Help needed climbing 3-5 steps with a railing? : A Little 6 Click Score: 17    End of Session Equipment Utilized During Treatment: Gait belt Activity Tolerance: Patient tolerated treatment well Patient left: in bed;with call bell/phone within reach Nurse Communication: Mobility status PT Visit Diagnosis: Unsteadiness on feet (R26.81);Other abnormalities of gait and mobility (R26.89)    Time: 1050-1104 PT Time Calculation (min) (ACUTE ONLY): 14 min   Charges:   PT Evaluation $PT Eval Low Complexity: 1 Low     PT G Codes:        [name], PT, DPT  Acute Rehabilitation Services  Pager: [phone]   [name] 07/18/2017, 11:09 AM

## 2017-07-18 NOTE — ED Notes (Signed)
Gave pt urinal 

## 2017-07-18 NOTE — ED Notes (Cosign Needed)
Regular lunch diet tray ordered.

## 2017-07-18 NOTE — Telephone Encounter (Signed)
Patient is currently in the hospital for shortness of breath. Will wait for patient's discharge and see if he will need to continue this medication prior refilling it.  Guadalupe Dawn MD PGY-1 Family Medicine Resident

## 2017-07-18 NOTE — Progress Notes (Signed)
Family Medicine Teaching Service Daily Progress Note Intern Pager: [phone]  Patient name: [name] Medical record number: [id number] Date of birth: 1933-03-26 Age: 84 y.o. Gender: adult  Primary Care Provider: Guadalupe Dawn, MD Consultants:  Code Status: partial, DO NOT INTUBATE  Pt Overview and Major Events to Date:  5/13- admit  Assessment and Plan: [name] is a 84 y.o. adult presenting with CAP. PMH is significant for COPD, T2DM, gout, HTN, HLD  CAP w/ sepsis critieria: Febrile but improving at 100.4, WBC improving to 18.1, Tachycardic to 110s with likely source of pneumonia -symptoms and a chest x-ray with left lung base opacity.   - Blood cultures pending  - Continue IV azithromycin (5/13- ) -cont CTX (5/13- )  - Duonebs every 6 hours  -albuterol Q2prn - Home COPD regiment of Spiriva and Dulera  - Continuous pulse oximetry, supplement O2 [place] to sats 89-95% - vitals per floor  - Trend LA  - Legionella + Strep pneumonia Antigen - Sputum culture   Pulmonary Nodule -  CT chest ordered  AKI :Baseline SCR 1.3; SCR 1.79, Likely prerenal, due to decreased fluid intake, s/p 2L bolus - BMET  - NS @ 125 ml   T2DM : A1C 6.8  - Diet controlled   COPD: 50 year pack hx  - Continue Spiriva and Dulera  - Duonebs every 6 hours   FEN/GI:Carb modified diet  Prophylaxis: Lovenox   Disposition: PT/OT pending home   Subjective:  Frustrated that he is not cleared of his cough yet, we discussed timeline expectations  Objective: Temp:  [98.7 F (37.1 C)-104.2 F (40.1 C)] 102 F (38.9 C) (05/13 1913) Pulse Rate:  [105-140] 105 (05/14 0115) Resp:  [12-23] 12 (05/14 0115) BP: (104-135)/(62-108) 135/108 (05/14 0115) SpO2:  [87 %-96 %] 96 % (05/14 0115) Weight:  [186 lb (84.4 kg)-189 lb (85.7 kg)] 189 lb (85.7 kg) (05/13 1936) Physical Exam: General: alert, well nourished  Cardiovascular: RRR, no murmur/rubs Respiratory: no IWB, pos for  rhonchi/decreased air flow on left Abdomen: no TTP, soft Extremities: grossly normal, no deficits noted  Laboratory: Recent Labs  Lab 07/17/17 1808  WBC 20.8*  HGB 14.5  HCT 40.6  PLT 222   Recent Labs  Lab 07/17/17 1808  NA 136  K 4.1  CL 99*  CO2 21*  BUN 24*  CREATININE 1.79*  CALCIUM 9.1  GLUCOSE 129*    Lactic acid 2.59>1.2>1.2  Imaging/Diagnostic Tests: Dg Chest 2 View  Result Date: 07/17/2017 CLINICAL DATA:  Pt is very SOB since fri and has copd says he quit smoking 15years ago. EXAM: CHEST - 2 VIEW COMPARISON:  Chest x-ray dated 02/17/2014. chest CT dated 11/02/2015. FINDINGS: Ill-defined opacities at the LEFT lung base, most likely pneumonia. Lungs are hyperexpanded. Chronic bronchitic changes noted centrally. Chronic scarring/fibrosis noted at the lung bases. Possible small nodular density within the RIGHT upper lung. Heart size and mediastinal contours are stable. No acute or suspicious osseous finding. IMPRESSION: 1. Ill-defined opacity at the LEFT lung base, pneumonia versus asymmetric edema, favor pneumonia. 2. Probable small nodular density within the RIGHT upper lobe, suspicious for new pulmonary nodule. Recommend chest CT for further characterization. 3. Hyperexpanded lungs indicating COPD. Associated chronic bronchitic changes. Electronically Signed   By: Franki Cabot M.D.   On: 07/17/2017 17:23   Ct Chest Wo Contrast  Result Date: 07/17/2017 CLINICAL DATA:  Shortness of breath for 2-3 days, thick productive cough, chills and fatigue. EXAM: CT CHEST WITHOUT CONTRAST TECHNIQUE: Multidetector CT  imaging of the chest was performed following the standard protocol without IV contrast. COMPARISON:  Chest CT dated 11/02/2015. FINDINGS: Cardiovascular: Heart size is normal. No pericardial effusion. No thoracic aortic aneurysm. Mild aortic atherosclerosis. Mediastinum/Nodes: Scattered small lymph nodes within the mediastinum, several borderline enlarged lymph nodes  measuring up to 9 mm short axis dimension, likely reactive in nature. Esophagus is unremarkable. Trachea and central bronchi are unremarkable. Lungs/Pleura: Patchy dense consolidations throughout the LEFT lower lobe. Additional scattered smaller consolidations within the inferior portion of the LEFT upper lobe, some with central cavitation. 8 mm nodular consolidation within the more superior aspects of the LEFT upper lobe, with central cavitation. Single small nodular consolidation within the RIGHT upper lobe, measuring 12 mm greatest dimension. Mild scarring/atelectasis at the RIGHT lung base. Upper Abdomen: No acute abnormality. Musculoskeletal: No acute or suspicious osseous finding. IMPRESSION: 1. Patchy dense consolidations throughout the LEFT lower lobe, consistent with pneumonia. 2. Additional smaller consolidations within the LEFT upper lobe and RIGHT upper lobe, with suggestion of central cavitation, raising the possibility of atypical pneumonia such as fungal or viral. Recommend follow-up chest CT after appropriate treatment to ensure resolution. 3. Scattered small lymph nodes within the mediastinum, likely reactive in nature. Aortic Atherosclerosis (ICD10-I70.0). Electronically Signed   By: Franki Cabot M.D.   On: 07/17/2017 22:32     Sherene Sires, DO 07/18/2017, 1:27 AM PGY-1, [place] Intern pager: [phone], text pages welcome

## 2017-07-18 NOTE — ED Notes (Signed)
Admitting paged to RN Caryl Pina regarding downgrading the pt to tele per her request

## 2017-07-19 ENCOUNTER — Ambulatory Visit: Payer: PPO | Admitting: Family Medicine

## 2017-07-19 DIAGNOSIS — J189 Pneumonia, unspecified organism: Secondary | ICD-10-CM

## 2017-07-19 DIAGNOSIS — J441 Chronic obstructive pulmonary disease with (acute) exacerbation: Secondary | ICD-10-CM

## 2017-07-19 DIAGNOSIS — Z419 Encounter for procedure for purposes other than remedying health state, unspecified: Secondary | ICD-10-CM

## 2017-07-19 DIAGNOSIS — R0602 Shortness of breath: Secondary | ICD-10-CM

## 2017-07-19 LAB — BASIC METABOLIC PANEL
Anion gap: 8 (ref 5–15)
BUN: 13 mg/dL (ref 6–20)
CO2: 21 mmol/L — ABNORMAL LOW (ref 22–32)
Calcium: 8.4 mg/dL — ABNORMAL LOW (ref 8.9–10.3)
Chloride: 108 mmol/L (ref 101–111)
Creatinine, Ser: 1.31 mg/dL — ABNORMAL HIGH (ref 0.61–1.24)
GFR calc Af Amer: 57 mL/min — ABNORMAL LOW (ref >60)
GFR calc non Af Amer: 49 mL/min — ABNORMAL LOW (ref >60)
Glucose, Bld: 165 mg/dL — ABNORMAL HIGH (ref 65–99)
Potassium: 3.9 mmol/L (ref 3.5–5.1)
Sodium: 137 mmol/L (ref 135–145)

## 2017-07-19 LAB — CBC
HCT: 32 % — ABNORMAL LOW (ref 39.0–52.0)
Hemoglobin: 11 g/dL — ABNORMAL LOW (ref 13.0–17.0)
MCH: 26.3 pg (ref 26.0–34.0)
MCHC: 34.4 g/dL (ref 30.0–36.0)
MCV: 76.4 fL — ABNORMAL LOW (ref 78.0–100.0)
Platelets: 213 10*3/uL (ref 150–400)
RBC: 4.19 MIL/uL — ABNORMAL LOW (ref 4.22–5.81)
RDW: 15.1 % (ref 11.5–15.5)
WBC: 13.2 10*3/uL — ABNORMAL HIGH (ref 4.0–10.5)

## 2017-07-19 LAB — MRSA PCR SCREENING: MRSA by PCR: NEGATIVE

## 2017-07-19 MED ORDER — CEFDINIR 300 MG PO CAPS
300.0000 mg | ORAL_CAPSULE | Freq: Two times a day (BID) | ORAL | Status: DC
Start: 2017-07-19 — End: 2017-07-20
  Administered 2017-07-19 – 2017-07-20 (×3): 300 mg via ORAL
  Filled 2017-07-19 (×3): qty 1

## 2017-07-19 MED ORDER — AZITHROMYCIN 250 MG PO TABS
250.0000 mg | ORAL_TABLET | Freq: Every day | ORAL | Status: DC
  Administered 2017-07-19 – 2017-07-20 (×2): 250 mg via ORAL
  Filled 2017-07-19 (×2): qty 1

## 2017-07-19 NOTE — Progress Notes (Addendum)
Family Medicine Teaching Service Daily Progress Note Intern Pager: [phone]  Patient name: [name] Medical record number: [id number] Date of birth: 02/27/1934 Age: 84 y.o. Gender: adult  Primary Care Provider: Guadalupe Dawn, MD Consultants:  Code Status: partial, DO NOT INTUBATE  Pt Overview and Major Events to Date:  5/13- admit  Assessment and Plan: Mahin Guardia is a 84 y.o. adult presenting with CAP. PMH is significant for COPD, T2DM, gout, HTN, HLD  CAP w/ sepsis critieria: Resolved with stable vitals on room air.   Minimally tachy.  WBC improving to 18.1, Tachycardic to 110s with likely source of pneumonia -symptoms and a chest x-ray with left lung base opacity.   - Blood cultures neg x24hrs, finals pending - d/c IV azithromycin (5/13- ) and transition to PO -d/c CTX (5/13- )  - Duonebs every 6 hours  -albuterol Q2prn - Home COPD regiment of Spiriva and Dulera  - Continuous pulse oximetry, supplement O2 [place] to sats 89-95% - vitals per floor  - Trend LA  - Legionella + Strep pneumonia Antigen - Sputum culture not collected yet -d/c IVF, encrourage PO intake  Pulmonary Nodule -  CT chest concerning for bilateral upper lobe cavitary lesions, will need outpatient followup, abx coverage discussed atypical coverage  AKI :Baseline SCR 1.3-1.5; SCR improved now to 1.59, Likely prerenal, due to decreased fluid intake, s/p 2L bolus - BMET  - d/c NS @ 125 ml  -PATIENT NOT INTERESTED IN DIALYSIS EVAL  T2DM : A1C 6.8  - Diet controlled   COPD: 50 year pack hx  - Continue Spiriva and Dulera  - Duonebs every 6 hours   FEN/GI:Carb modified diet  Prophylaxis: Lovenox   Disposition:  will d/c to home when stable, maybe 5/16  Subjective:  Patient feeling improved significantly although his wife thinks he needs a bit more time.  They disagree on his tachycardia being a chronic state  Objective: Temp:  [98.9 F (37.2 C)-100.4 F (38 C)] 98.9 F (37.2 C)  (05/14 1452) Pulse Rate:  [98-111] 108 (05/14 1550) Resp:  [13-25] 19 (05/14 1550) BP: (129-152)/(81-98) 132/86 (05/14 1550) SpO2:  [91 %-99 %] 95 % (05/14 1900) Weight:  [189 lb 13.1 oz (86.1 kg)] 189 lb 13.1 oz (86.1 kg) (05/14 1550) Physical Exam: General: NAD, pleasant ENTM: Moist mucous membranes, no pharyngeal erythema or exudate Neck: Supple, no LAD Cardiovascular: slightly tachy but regular rhythm, no m/r/g, no LE edema Respiratory: mildly rhonchi, normal work of breathing, good air flow, no wheezes/crackles Gastrointestinal: soft, nontender, nondistended, normoactive BS MSK: moves 4 extremities equally Derm: no rashes appreciated Neuro: CN II-XII grossly intact Psych: AOx3, appropriate affect   Laboratory: Recent Labs  Lab 07/17/17 1808 07/18/17 0208  WBC 20.8* 18.1*  HGB 14.5 12.0*  HCT 40.6 34.8*  PLT 222 162   Recent Labs  Lab 07/17/17 1808 07/18/17 0208  NA 136 137  K 4.1 4.2  CL 99* 106  CO2 21* 18*  BUN 24* 24*  CREATININE 1.79* 1.59*  CALCIUM 9.1 7.9*  PROT  --  7.2  BILITOT  --  1.1  ALKPHOS  --  70  ALT  --  26  AST  --  29  GLUCOSE 129* 162*    Lactic acid 2.59>1.2>1.2  Imaging/Diagnostic Tests: Dg Chest 2 View  Result Date: 07/17/2017 CLINICAL DATA:  Pt is very SOB since fri and has copd says he quit smoking 15years ago. EXAM: CHEST - 2 VIEW COMPARISON:  Chest x-ray dated 02/17/2014. chest CT  dated 11/02/2015. FINDINGS: Ill-defined opacities at the LEFT lung base, most likely pneumonia. Lungs are hyperexpanded. Chronic bronchitic changes noted centrally. Chronic scarring/fibrosis noted at the lung bases. Possible small nodular density within the RIGHT upper lung. Heart size and mediastinal contours are stable. No acute or suspicious osseous finding. IMPRESSION: 1. Ill-defined opacity at the LEFT lung base, pneumonia versus asymmetric edema, favor pneumonia. 2. Probable small nodular density within the RIGHT upper lobe, suspicious for new  pulmonary nodule. Recommend chest CT for further characterization. 3. Hyperexpanded lungs indicating COPD. Associated chronic bronchitic changes. Electronically Signed   By: Franki Cabot M.D.   On: 07/17/2017 17:23   Ct Chest Wo Contrast  Result Date: 07/17/2017 CLINICAL DATA:  Shortness of breath for 2-3 days, thick productive cough, chills and fatigue. EXAM: CT CHEST WITHOUT CONTRAST TECHNIQUE: Multidetector CT imaging of the chest was performed following the standard protocol without IV contrast. COMPARISON:  Chest CT dated 11/02/2015. FINDINGS: Cardiovascular: Heart size is normal. No pericardial effusion. No thoracic aortic aneurysm. Mild aortic atherosclerosis. Mediastinum/Nodes: Scattered small lymph nodes within the mediastinum, several borderline enlarged lymph nodes measuring up to 9 mm short axis dimension, likely reactive in nature. Esophagus is unremarkable. Trachea and central bronchi are unremarkable. Lungs/Pleura: Patchy dense consolidations throughout the LEFT lower lobe. Additional scattered smaller consolidations within the inferior portion of the LEFT upper lobe, some with central cavitation. 8 mm nodular consolidation within the more superior aspects of the LEFT upper lobe, with central cavitation. Single small nodular consolidation within the RIGHT upper lobe, measuring 12 mm greatest dimension. Mild scarring/atelectasis at the RIGHT lung base. Upper Abdomen: No acute abnormality. Musculoskeletal: No acute or suspicious osseous finding. IMPRESSION: 1. Patchy dense consolidations throughout the LEFT lower lobe, consistent with pneumonia. 2. Additional smaller consolidations within the LEFT upper lobe and RIGHT upper lobe, with suggestion of central cavitation, raising the possibility of atypical pneumonia such as fungal or viral. Recommend follow-up chest CT after appropriate treatment to ensure resolution. 3. Scattered small lymph nodes within the mediastinum, likely reactive in nature.  Aortic Atherosclerosis (ICD10-I70.0). Electronically Signed   By: Franki Cabot M.D.   On: 07/17/2017 22:32     Sherene Sires, DO 07/19/2017, 5:23 AM PGY-1, [place] Intern pager: [phone], text pages welcome

## 2017-07-19 NOTE — Progress Notes (Signed)
Physical Therapy Treatment Patient Details Name: [name] MRN: [id number] DOB: [birth year] Today's Date: 07/19/2017    History of Present Illness Pt is an 84 y/o male admitted secondary to worsening SOB. CT imaging revealed L lower and upper, and R upper lobe consolidations consistent with pneumonia. PMH includes COPD, asthma, HTN, gout, and DM.     PT Comments    Pt making good progress towards his goals and is only limited in his safe mobility by dyspnea with exertion. Pt is currently able to ambulate 400 feet without AD and ascend/descend 10 steps with min guard for safety. D/c plans continue to remain appropriate.      Follow Up Recommendations  No PT follow up;Supervision for mobility/OOB     Equipment Recommendations  None recommended by PT    Recommendations for Other Services       Precautions / Restrictions Precautions Precautions: None Restrictions Weight Bearing Restrictions: No    Mobility    Transfers                 General transfer comment: standing in room on entry having gown changed  Ambulation/Gait Ambulation/Gait assistance: Supervision Ambulation Distance (Feet): 400 Feet Assistive device: None Gait Pattern/deviations: Step-through pattern;Decreased stride length Gait velocity: Decreased  Gait velocity interpretation: <1.8 ft/sec, indicate of risk for recurrent falls General Gait Details: Slow, mildly unsteady gait requiring min guard for safety. Pt with increasing DoE towards end of ambulation SaO2 on RA >92%O2    Stairs Stairs: Yes Stairs assistance: Min guard Stair Management: One rail Left;Forwards;Alternating pattern Number of Stairs: 10 General stair comments: min guard for safety, strong steady ascent, stood for minute at top of stairs to catch his breath before descent slow steady descent using L handrail       Balance Overall balance assessment: Needs assistance Sitting-balance support: No upper extremity  supported;Feet supported Sitting balance-Leahy Scale: Good     Standing balance support: No upper extremity supported;During functional activity Standing balance-Leahy Scale: Fair Standing balance comment: standing for gown change with no Lo                            Cognition Arousal/Alertness: Awake/alert Behavior During Therapy: WFL for tasks assessed/performed Overall Cognitive Status: Within Functional Limits for tasks assessed                                               Pertinent Vitals/Pain Pain Assessment: No/denies pain           PT Goals (current goals can now be found in the care plan section) Acute Rehab PT Goals Patient Stated Goal: to go home  PT Goal Formulation: With patient Time For Goal Achievement: 08/01/17 Potential to Achieve Goals: Good    Frequency    Min 3X/week      PT Plan Current plan remains appropriate       AM-PAC PT "6 Clicks" Daily Activity  Outcome Measure  Difficulty turning over in bed (including adjusting bedclothes, sheets and blankets)?: None Difficulty moving from lying on back to sitting on the side of the bed? : A Little Difficulty sitting down on and standing up from a chair with arms (e.g., wheelchair, bedside commode, etc,.)?: A Little Help needed moving to and from a bed to chair (including a wheelchair)?: A Little Help needed  walking in hospital room?: A Little Help needed climbing 3-5 steps with a railing? : A Little 6 Click Score: 19    End of Session Equipment Utilized During Treatment: Gait belt Activity Tolerance: Patient tolerated treatment well Patient left: in bed;with call bell/phone within reach Nurse Communication: Mobility status PT Visit Diagnosis: Unsteadiness on feet (R26.81);Other abnormalities of gait and mobility (R26.89)     Time: 1207-1222 PT Time Calculation (min) (ACUTE ONLY): 15 min  Charges:  $Gait Training: 8-22 mins                    G Codes:        Ieesha Abbasi B. Migdalia Dk PT, DPT Acute Rehabilitation  [phone] Pager [phone]     [place] 07/19/2017, 12:58 PM

## 2017-07-19 NOTE — Progress Notes (Signed)
Occupational Therapy Evaluation Patient Details Name: [name] MRN: [id number] DOB: 1933-05-12 Today's Date: 07/19/2017    History of Present Illness Pt is an 84 y/o male admitted secondary to worsening SOB. CT imaging revealed L lower and upper, and R upper lobe consolidations consistent with pneumonia. PMH includes COPD, asthma, HTN, gout, and DM.    Clinical Impression   PTA, pt independent with ADL and mobility and active with IADL tasks. Pt states that he has started getting SOB with longer walks in the community, requiring him to "take a break". Pt appears at his baseline regarding his ADL and functional mobility status. Educated pt on energy conservation and strategies to reduce risk of falls. Pt safe to DC home when medically stable.     Follow Up Recommendations  No OT follow up    Equipment Recommendations       Recommendations for Other Services       Precautions / Restrictions Precautions Precautions: None Restrictions Weight Bearing Restrictions: No      Mobility Bed Mobility Overal bed mobility: Independent                Transfers Overall transfer level: Independent                    Balance Overall balance assessment: No apparent balance deficits (not formally assessed)                                         ADL either performed or assessed with clinical judgement   ADL Overall ADL's : At baseline                                       General ADL Comments: Began education on energy conservation strategies; written information given; Educated on reducing risk of falls(slight dizziness when picking up object from floor.)     Vision Baseline Vision/History: Wears glasses       Perception     Praxis      Pertinent Vitals/Pain Pain Assessment: No/denies pain     Hand Dominance Right   Extremity/Trunk Assessment Upper Extremity Assessment Upper Extremity Assessment: Overall WFL for  tasks assessed   Lower Extremity Assessment Lower Extremity Assessment: Defer to PT evaluation   Cervical / Trunk Assessment Cervical / Trunk Assessment: Normal   Communication Communication Communication: HOH   Cognition Arousal/Alertness: Awake/alert Behavior During Therapy: WFL for tasks assessed/performed Overall Cognitive Status: Within Functional Limits for tasks assessed                                     General Comments       Exercises     Shoulder Instructions      Home Living Family/patient expects to be discharged to:: Private residence Living Arrangements: Spouse/significant other Available Help at Discharge: Family;Available 24 hours/day Type of Home: House Home Access: Stairs to enter [employer] of Steps: 5-6 Entrance Stairs-Rails: Right;Left Home Layout: Two level Alternate Level Stairs-Number of Steps: 15 Alternate Level Stairs-Rails: Left Bathroom Shower/Tub: Teacher, early years/pre: Standard Bathroom Accessibility: Yes How Accessible: (Patient is able to safely ambulate into walk in shower.) Home Equipment: Walker - 2 wheels;Shower seat  Prior Functioning/Environment Level of Independence: Independent        Comments: Was mowing the yard and shopping. SOB/fatigue with extended walking in communityu        OT Problem List: Decreased activity tolerance      OT Treatment/Interventions:      OT Goals(Current goals can be found in the care plan section) Acute Rehab OT Goals Patient Stated Goal: to go home and resume home maintenance responsibilities  OT Goal Formulation: All assessment and education complete, DC therapy ADL Goals Pt Will Perform Grooming: Independently  OT Frequency:     Barriers to D/C:            Co-evaluation              AM-PAC PT "6 Clicks" Daily Activity     Outcome Measure Help from another person eating meals?: None Help from another person taking care  of personal grooming?: None Help from another person toileting, which includes using toliet, bedpan, or urinal?: None Help from another person bathing (including washing, rinsing, drying)?: None Help from another person to put on and taking off regular upper body clothing?: None Help from another person to put on and taking off regular lower body clothing?: None 6 Click Score: 24   End of Session Nurse Communication: Other (comment)(D/C plan)  Activity Tolerance: Patient tolerated treatment well Patient left: in bed;with call bell/phone within reach  OT Visit Diagnosis: Unsteadiness on feet (R26.81)                Time: 6834-1962 OT Time Calculation (min): 14 min Charges:  OT General Charges $OT Visit: 1 Visit OT Evaluation $OT Eval Low Complexity: 1 Low G-Codes:     Maurie Boettcher, OT/L  OT Clinical Specialist [phone]   [facility] 07/19/2017, 4:59 PM

## 2017-07-20 LAB — CBC
HCT: 32.2 % — ABNORMAL LOW (ref 39.0–52.0)
Hemoglobin: 11.1 g/dL — ABNORMAL LOW (ref 13.0–17.0)
MCH: 26.2 pg (ref 26.0–34.0)
MCHC: 34.5 g/dL (ref 30.0–36.0)
MCV: 75.9 fL — ABNORMAL LOW (ref 78.0–100.0)
Platelets: 255 10*3/uL (ref 150–400)
RBC: 4.24 MIL/uL (ref 4.22–5.81)
RDW: 14.6 % (ref 11.5–15.5)
WBC: 13.6 10*3/uL — ABNORMAL HIGH (ref 4.0–10.5)

## 2017-07-20 LAB — BASIC METABOLIC PANEL
Anion gap: 10 (ref 5–15)
BUN: 14 mg/dL (ref 6–20)
CO2: 20 mmol/L — ABNORMAL LOW (ref 22–32)
Calcium: 8.8 mg/dL — ABNORMAL LOW (ref 8.9–10.3)
Chloride: 107 mmol/L (ref 101–111)
Creatinine, Ser: 1.26 mg/dL — ABNORMAL HIGH (ref 0.61–1.24)
GFR calc Af Amer: 60 mL/min — ABNORMAL LOW (ref >60)
GFR calc non Af Amer: 52 mL/min — ABNORMAL LOW (ref >60)
Glucose, Bld: 114 mg/dL — ABNORMAL HIGH (ref 65–99)
Potassium: 3.8 mmol/L (ref 3.5–5.1)
Sodium: 137 mmol/L (ref 135–145)

## 2017-07-20 LAB — LEGIONELLA PNEUMOPHILA TOTAL AB: Legionella Pneumo Total Ab: <0.91 OD ratio (ref 0.00–0.90)

## 2017-07-20 MED ORDER — ALBUTEROL SULFATE (2.5 MG/3ML) 0.083% IN NEBU: 2.5000 mg | INHALATION_SOLUTION | Freq: Once | RESPIRATORY_TRACT | Status: DC

## 2017-07-20 MED ORDER — FLUTICASONE-SALMETEROL 500-50 MCG/DOSE IN AEPB: INHALATION_SPRAY | RESPIRATORY_TRACT | 3 refills | Status: DC

## 2017-07-20 MED ORDER — IPRATROPIUM BROMIDE 0.02 % IN SOLN: 0.5000 mg | Freq: Once | RESPIRATORY_TRACT | Status: DC

## 2017-07-20 MED ORDER — AZITHROMYCIN 250 MG PO TABS: 250.0000 mg | ORAL_TABLET | Freq: Every day | ORAL | 0 refills | Status: AC

## 2017-07-20 MED ORDER — CEFDINIR 300 MG PO CAPS: 300.0000 mg | ORAL_CAPSULE | Freq: Two times a day (BID) | ORAL | 0 refills | Status: AC

## 2017-07-20 NOTE — Progress Notes (Signed)
Physical Therapy Treatment Patient Details Name: [name] MRN: [id number] DOB: 04/23/1933 Today's Date: 07/20/2017    History of Present Illness Pt is an 84 y/o male admitted secondary to worsening SOB. CT imaging revealed L lower and upper, and R upper lobe consolidations consistent with pneumonia. PMH includes COPD, asthma, HTN, gout, and DM.     PT Comments    Pt continues to progress towards his goals and is independent in bed mobility and transfers, requiring supervision with ambulation that incorporates higher level balance activities like head turns. Pt encourage to continue walking at home and return to his exercise routine while being mindful that he will probably fatigue more easily.     Follow Up Recommendations  No PT follow up;Supervision for mobility/OOB     Equipment Recommendations  None recommended by PT    Recommendations for Other Services       Precautions / Restrictions Precautions Precautions: None Restrictions Weight Bearing Restrictions: No    Mobility  Bed Mobility Overal bed mobility: Independent                Transfers Overall transfer level: Independent                  Ambulation/Gait Ambulation/Gait assistance: Supervision Ambulation Distance (Feet): 600 Feet Assistive device: None Gait Pattern/deviations: Step-through pattern;Decreased stride length Gait velocity: slowed Gait velocity interpretation: <1.8 ft/sec, indicate of risk for recurrent falls General Gait Details: slow, steady gait in straight line ambulation, slight drifting and slowing with higher level head turns and velocity changes.       Balance Overall balance assessment: Needs assistance                           High level balance activites: Turns;Direction changes;Head turns High Level Balance Comments: mild unsteadiness and gait velocity changes with higher level balance activities            Cognition Arousal/Alertness:  Awake/alert Behavior During Therapy: WFL for tasks assessed/performed Overall Cognitive Status: Within Functional Limits for tasks assessed                                               Pertinent Vitals/Pain Pain Assessment: No/denies pain    Home Living Family/patient expects to be discharged to:: Private residence Living Arrangements: Spouse/significant other Available Help at Discharge: Family;Available 24 hours/day Type of Home: House Home Access: Stairs to enter Entrance Stairs-Rails: Right;Left Home Layout: Two level Home Equipment: Environmental consultant - 2 wheels;Shower seat      Prior Function Level of Independence: Independent      Comments: Was mowing the yard and shopping. SOB/fatigue with extended walking in communityu   PT Goals (current goals can now be found in the care plan section) Acute Rehab PT Goals Patient Stated Goal: to go home and resume home maintenance responsibilities  PT Goal Formulation: With patient Time For Goal Achievement: 08/01/17 Potential to Achieve Goals: Good Progress towards PT goals: Progressing toward goals    Frequency    Min 3X/week      PT Plan Current plan remains appropriate       AM-PAC PT "6 Clicks" Daily Activity  Outcome Measure  Difficulty turning over in bed (including adjusting bedclothes, sheets and blankets)?: None Difficulty moving from lying on back to sitting on the side of the  bed? : None Difficulty sitting down on and standing up from a chair with arms (e.g., wheelchair, bedside commode, etc,.)?: None Help needed moving to and from a bed to chair (including a wheelchair)?: None Help needed walking in hospital room?: None Help needed climbing 3-5 steps with a railing? : A Little 6 Click Score: 23    End of Session Equipment Utilized During Treatment: Gait belt Activity Tolerance: Patient tolerated treatment well Patient left: in bed;with call bell/phone within reach Nurse Communication: Mobility  status PT Visit Diagnosis: Unsteadiness on feet (R26.81);Other abnormalities of gait and mobility (R26.89)     Time: 1130-1146 PT Time Calculation (min) (ACUTE ONLY): 16 min  Charges:  $Gait Training: 8-22 mins                    G Codes:       Clella Mckeel B. Migdalia Dk PT, DPT Acute Rehabilitation  [phone] Pager [phone]     [place] 07/20/2017, 12:42 PM

## 2017-07-20 NOTE — Consult Note (Signed)
   [facility] CM Inpatient Consult   07/20/2017  Coady Stoudt 07/02/1933 [id number]  Patient screened for potential [facility] Care Management services in the [employer] plan. Met with the patient at the bedside.  Wife and daughter at the bedside as well awaiting transition home.  Patient denies transportation needs, had no issues currently with medications or resources.  Patient is hard of hearing. Patient is agreeable to EMMI pneumonia calls for post hospital follow up.  He endorses Jacob Fletcher, MD at [facility]. A brochure and 24 hour nurse advise line magnet given and explained.  They verbalized understanding. Inpatient RNCM, Deborah aware of [facility] to follow with EMMI.   For questions contact:   Victoria Brewer, RN BSN CCM [facility] Liaison  [phone] business mobile phone Toll free office [phone]   

## 2017-07-20 NOTE — Discharge Instructions (Addendum)
Please finish a 14 day course of antibiotic, azithromycin, ending 5/26 and follow up with pulmonology as an outpatient. Please follow up with your primary care physician.

## 2017-07-20 NOTE — Progress Notes (Signed)
Family Medicine Teaching Service Daily Progress Note Intern Pager: [phone]  Patient name: [name] Medical record number: [id number] Date of birth: 1933-10-10 Age: 84 y.o. Gender: adult  Primary Care Provider: Guadalupe Dawn, MD Consultants:  Code Status: partial, DO NOT INTUBATE  Pt Overview and Major Events to Date:  5/13- admit  Assessment and Plan: [name] is a 84 y.o. adult presenting with CAP. PMH is significant for COPD, T2DM, gout, HTN, HLD  CAP w/ sepsis critieria: Resolved symptoms with stable vitals.   WBC improved to 13.6, chest x-ray with left lung base opacity.  CT with potential cavitary lesions in bilateral upper lobes.  Blood cx neg.  S/p CTX (5/13-15).  Legionella + Strep pneumonia neg -PO azithromycin instead of IV (5/13- ), planned through 26th -cefdinir 300 BID (5/15- ) planned through 26th   - Duonebs every 6 hours  -albuterol Q2prn - Home COPD regiment of Spiriva and Dulera  - Continuous pulse oximetry, supplement O2 Sunflower to sats 89-95% - vitals per floor   Pulmonary Nodule -  CT chest concerning for bilateral upper lobe cavitary lesions, will need outpatient followup, abx coverage discussed atypical coverage  AKI :RESOLVED Baseline SCR 1.3-1.5; SCR improved now to 1.26, Likely prerenal,  s/p 2L bolus  T2DM : A1C 6.8  - Diet controlled   COPD: 50 year pack hx  - Continue Spiriva and Dulera  - Duonebs every 6 hours   FEN/GI:Carb modified diet  Prophylaxis: Lovenox   Disposition:  will likely d/c today  Subjective:  Feeling well, would like to go home  Objective: Temp:  [98.6 F (37 C)-99.3 F (37.4 C)] 99.3 F (37.4 C) (05/15 2314) Pulse Rate:  [92-98] 97 (05/15 2314) Resp:  [17-19] 17 (05/15 2314) BP: (136-152)/(93-95) 136/93 (05/15 2314) SpO2:  [91 %-97 %] 91 % (05/15 2314) Physical Exam: General: NAD, pleasant Eyes: PERRL, EOMI, no conjunctival pallor or injection ENTM: Moist mucous membranes, no pharyngeal  erythema or exudate Neck: Supple, no LAD Cardiovascular: RRR, no m/r/g, no LE edema Respiratory: CTA BL, normal work of breathing, no wheezes/crackles Gastrointestinal: soft, nontender, nondistended, normoactive BS MSK: moves 4 extremities equally Derm: no rashes appreciated Neuro: CN II-XII grossly intact Psych: AOx3, appropriate affect    Laboratory: Recent Labs  Lab 07/18/17 0208 07/19/17 0917 07/20/17 0234  WBC 18.1* 13.2* 13.6*  HGB 12.0* 11.0* 11.1*  HCT 34.8* 32.0* 32.2*  PLT 162 213 255   Recent Labs  Lab 07/18/17 0208 07/19/17 0917 07/20/17 0234  NA 137 137 137  K 4.2 3.9 3.8  CL 106 108 107  CO2 18* 21* 20*  BUN 24* 13 14  CREATININE 1.59* 1.31* 1.26*  CALCIUM 7.9* 8.4* 8.8*  PROT 7.2  --   --   BILITOT 1.1  --   --   ALKPHOS 70  --   --   ALT 26  --   --   AST 29  --   --   GLUCOSE 162* 165* 114*    Lactic acid 2.59>1.2>1.2  Imaging/Diagnostic Tests: Dg Chest 2 View  Result Date: 07/17/2017 CLINICAL DATA:  Pt is very SOB since fri and has copd says he quit smoking 15years ago. EXAM: CHEST - 2 VIEW COMPARISON:  Chest x-ray dated 02/17/2014. chest CT dated 11/02/2015. FINDINGS: Ill-defined opacities at the LEFT lung base, most likely pneumonia. Lungs are hyperexpanded. Chronic bronchitic changes noted centrally. Chronic scarring/fibrosis noted at the lung bases. Possible small nodular density within the RIGHT upper lung. Heart size and  mediastinal contours are stable. No acute or suspicious osseous finding. IMPRESSION: 1. Ill-defined opacity at the LEFT lung base, pneumonia versus asymmetric edema, favor pneumonia. 2. Probable small nodular density within the RIGHT upper lobe, suspicious for new pulmonary nodule. Recommend chest CT for further characterization. 3. Hyperexpanded lungs indicating COPD. Associated chronic bronchitic changes. Electronically Signed   By: Franki Cabot M.D.   On: 07/17/2017 17:23   Ct Chest Wo Contrast  Result Date:  07/17/2017 CLINICAL DATA:  Shortness of breath for 2-3 days, thick productive cough, chills and fatigue. EXAM: CT CHEST WITHOUT CONTRAST TECHNIQUE: Multidetector CT imaging of the chest was performed following the standard protocol without IV contrast. COMPARISON:  Chest CT dated 11/02/2015. FINDINGS: Cardiovascular: Heart size is normal. No pericardial effusion. No thoracic aortic aneurysm. Mild aortic atherosclerosis. Mediastinum/Nodes: Scattered small lymph nodes within the mediastinum, several borderline enlarged lymph nodes measuring up to 9 mm short axis dimension, likely reactive in nature. Esophagus is unremarkable. Trachea and central bronchi are unremarkable. Lungs/Pleura: Patchy dense consolidations throughout the LEFT lower lobe. Additional scattered smaller consolidations within the inferior portion of the LEFT upper lobe, some with central cavitation. 8 mm nodular consolidation within the more superior aspects of the LEFT upper lobe, with central cavitation. Single small nodular consolidation within the RIGHT upper lobe, measuring 12 mm greatest dimension. Mild scarring/atelectasis at the RIGHT lung base. Upper Abdomen: No acute abnormality. Musculoskeletal: No acute or suspicious osseous finding. IMPRESSION: 1. Patchy dense consolidations throughout the LEFT lower lobe, consistent with pneumonia. 2. Additional smaller consolidations within the LEFT upper lobe and RIGHT upper lobe, with suggestion of central cavitation, raising the possibility of atypical pneumonia such as fungal or viral. Recommend follow-up chest CT after appropriate treatment to ensure resolution. 3. Scattered small lymph nodes within the mediastinum, likely reactive in nature. Aortic Atherosclerosis (ICD10-I70.0). Electronically Signed   By: Franki Cabot M.D.   On: 07/17/2017 22:32     Sherene Sires, DO 07/20/2017, 6:09 AM PGY-1, [place] Intern pager: [phone], text pages welcome

## 2017-07-22 LAB — CULTURE, BLOOD (ROUTINE X 2)
Culture: NO GROWTH
Culture: NO GROWTH
Special Requests: ADEQUATE
Special Requests: ADEQUATE

## 2017-07-24 ENCOUNTER — Ambulatory Visit (INDEPENDENT_AMBULATORY_CARE_PROVIDER_SITE_OTHER): Payer: PPO | Admitting: Internal Medicine

## 2017-07-24 ENCOUNTER — Encounter: Payer: Self-pay | Admitting: Internal Medicine

## 2017-07-24 VITALS — BP 130/80 | HR 86 | Temp 98.0°F | Ht 71.0 in | Wt 185.2 lb

## 2017-07-24 DIAGNOSIS — R9389 Abnormal findings on diagnostic imaging of other specified body structures: Secondary | ICD-10-CM

## 2017-07-24 DIAGNOSIS — J181 Lobar pneumonia, unspecified organism: Secondary | ICD-10-CM

## 2017-07-24 DIAGNOSIS — J189 Pneumonia, unspecified organism: Secondary | ICD-10-CM

## 2017-07-24 MED ORDER — ALBUTEROL SULFATE (2.5 MG/3ML) 0.083% IN NEBU: 2.5000 mg | INHALATION_SOLUTION | Freq: Four times a day (QID) | RESPIRATORY_TRACT | 12 refills | Status: DC | PRN

## 2017-07-24 NOTE — Progress Notes (Signed)
[facility] Progress Note  Subjective:  [name] is a 84 y.o. male with history of COPD, T2DM, gout, HTN, and HLD who presents for hospital follow-up for LLL pneumonia. He was admitted from 5/13-5/16/19. He initially met sepsis criteria. He was given IV ceftriaxone and azithromycin then transitioned to cefdinir and azithromycin for 10 days, which he says he has no trouble taking. Blood cultures were negative. He reports improving cough though worse at night. Vick's nebulizer treatments have been helping. He requests albuterol nebulizer solution for his machine. He is still fatigued and has decreased appetite. He denies dyspnea. He has advair and spiriva inhalers at home, which he reports taking. He has history of abnormal chest xray in 2017 with scattered nodularities, mucoid impaction, and bibasilar ground-glass. CT chest during hospitalization showed consolidations in left upper and right upper lobes with possible central cavitation that could represent atypical pneumonia (fungal, viral?) with recommendation for follow-up after treatment. ROS: No fevers, no chest pain  No Known Allergies  Social History   Tobacco Use  . Smoking status: Former Smoker    Packs/day: 0.50    Years: 60.00    Pack years: 30.00    Types: Cigars, Cigarettes    Start date: 03/08/1951    Last attempt to quit: 06/08/2011    Years since quitting: 6.1  . Smokeless tobacco: Never Used  Substance Use Topics  . Alcohol use: No    Objective: Blood pressure 130/80, pulse 86, temperature 98 F (36.7 C), temperature source Oral, height '5\' 11"'  (1.803 m), weight 185 lb 3.2 oz (84 kg), SpO2 95 %. Body mass index is 25.83 kg/m. Constitutional: Well-appearing male in NAD HENT: MMM Cardiovascular: RRR, S1, S2, no m/r/g.  Pulmonary/Chest: Effort normal and breath sounds normal.  Musculoskeletal: No LE edema Neurological: AOx3, no focal deficits. Skin: Skin is warm and dry. No rash noted.   Psychiatric: Normal mood and affect.  Vitals reviewed  Assessment/Plan: Pneumonia - Improving symptoms. Recommended continued use of incentive spirometer. Provided rx for albuterol nebulizer solution and counseled that this is same as albuterol inhaler so do not take back-to-back.  - Complete 14 day total course of antibiotics. - Ordered follow-up CT chest wo contrast in approximately 1 month to follow-up abnormal chest CT.  Follow-up prn.  Olene Floss, MD [place], PGY-3

## 2017-07-24 NOTE — Patient Instructions (Signed)
[name],  We will call you to set up the follow-up CT scan.  I have called albuterol nebulizer solution into your pharmacy. Do not take this within a couple hours of the albuterol pump.  Best, Dr. Ola Spurr

## 2017-07-26 ENCOUNTER — Encounter: Payer: Self-pay | Admitting: Internal Medicine

## 2017-07-26 ENCOUNTER — Telehealth: Payer: Self-pay

## 2017-07-26 NOTE — Assessment & Plan Note (Addendum)
-   Improving symptoms. Recommended continued use of incentive spirometer. Provided rx for albuterol nebulizer solution and counseled that this is same as albuterol inhaler so do not take back-to-back.  - Complete 14 day total course of antibiotics. - Ordered follow-up CT chest wo contrast in approximately 1 month to follow-up abnormal chest CT.

## 2017-07-26 NOTE — Telephone Encounter (Signed)
Called patient to inform him of his CT Chest without  contrast ([facility]) on 08/25/2017 at 1400 hours. Show time is 1345 hours. Patient stated that he is appreciative of appointment  date and time and will comply.  Ozella Almond, [place]

## 2017-08-01 ENCOUNTER — Other Ambulatory Visit: Payer: Self-pay | Admitting: Family Medicine

## 2017-08-25 ENCOUNTER — Other Ambulatory Visit: Payer: PPO

## 2017-08-25 ENCOUNTER — Ambulatory Visit
Admission: RE | Admit: 2017-08-25 | Discharge: 2017-08-25 | Disposition: A | Discharge status: Home / Self Care | Payer: PPO | Source: Ambulatory Visit | Attending: Family Medicine | Admitting: Family Medicine

## 2017-08-25 DIAGNOSIS — J439 Emphysema, unspecified: Secondary | ICD-10-CM | POA: Diagnosis not present

## 2017-08-25 DIAGNOSIS — R9389 Abnormal findings on diagnostic imaging of other specified body structures: Secondary | ICD-10-CM

## 2017-08-28 ENCOUNTER — Encounter: Payer: Self-pay | Admitting: Internal Medicine

## 2017-08-28 DIAGNOSIS — Z961 Presence of intraocular lens: Secondary | ICD-10-CM | POA: Diagnosis not present

## 2017-08-28 DIAGNOSIS — H04123 Dry eye syndrome of bilateral lacrimal glands: Secondary | ICD-10-CM | POA: Diagnosis not present

## 2017-09-05 ENCOUNTER — Other Ambulatory Visit: Payer: Self-pay

## 2017-09-05 ENCOUNTER — Ambulatory Visit (INDEPENDENT_AMBULATORY_CARE_PROVIDER_SITE_OTHER): Payer: PPO | Admitting: Family Medicine

## 2017-09-05 ENCOUNTER — Encounter: Payer: Self-pay | Admitting: Family Medicine

## 2017-09-05 VITALS — BP 122/68 | HR 92 | Temp 98.1°F | Ht 71.0 in | Wt 190.8 lb

## 2017-09-05 DIAGNOSIS — J302 Other seasonal allergic rhinitis: Secondary | ICD-10-CM

## 2017-09-05 DIAGNOSIS — J449 Chronic obstructive pulmonary disease, unspecified: Secondary | ICD-10-CM | POA: Diagnosis not present

## 2017-09-05 DIAGNOSIS — E119 Type 2 diabetes mellitus without complications: Secondary | ICD-10-CM

## 2017-09-05 LAB — POCT GLYCOSYLATED HEMOGLOBIN (HGB A1C): HbA1c, POC (controlled diabetic range): 7.4 % — AB (ref 0.0–7.0)

## 2017-09-05 MED ORDER — LEVOCETIRIZINE DIHYDROCHLORIDE 5 MG PO TABS: 5.0000 mg | ORAL_TABLET | Freq: Every evening | ORAL | 0 refills | Status: DC

## 2017-09-05 MED ORDER — DM-GUAIFENESIN ER 30-600 MG PO TB12: 1.0000 | ORAL_TABLET | Freq: Two times a day (BID) | ORAL | 0 refills | Status: DC

## 2017-09-05 NOTE — Patient Instructions (Addendum)
It was great seeing you today! I am glad that your CT scan looks so much better. I think your eye redness and increased cough are due to a combination of copd and seasonal allergies. I will try a new medication called xyzal which is a histamine blocker to see if we can get some relief for your allergies. I will also give you a prescription for guaifenain-dextromorphan which should help out with you cough. Your a1c is increased today at 7.4 from 6.8. This is likely to your illness and subsequent treatment.

## 2017-09-08 NOTE — Progress Notes (Signed)
   HPI 84 year old who presents for follow up on CT scan results. Patient was admitted to the hospital in mid may and was noted to have multiple areas of central cavitation raising concern for atyipcal pneumonia. A repeat CT can was performed which showed that these had resolved. Patient also interested in atherosclerosis finding.  Patient also with increasing cough. He states that the cough is usually worse at night and he will occasionally cough so much that he he cant sleep at night. It has not been productive, but he can "feel the congestion".   CC: Follow up CT scan results   ROS:  Review of Systems See HPI for ROS.   CC, SH/smoking status, and VS noted  Objective: BP 122/68   Pulse 92   Temp 98.1 F (36.7 C) (Oral)   Ht 5\' 11"  (1.803 m)   Wt 190 lb 12.8 oz (86.5 kg)   SpO2 93%   BMI 26.61 kg/m  Gen: NAD, alert, cooperative, and pleasant. Well appearing, elderly, [name] CV: RRR, no murmur Resp: CTAB, no wheezes, non-labored Abd: SNTND, BS present, no guarding or organomegaly Ext: No edema, warm Neuro: Alert and oriented, Speech clear, No gross deficits BLE: intact pt/dp bilaterally, no signs of skin breakdow   Assessment and plan:  Chronic obstructive pulmonary disease ([place]) Seems to be doing well due to his spiriva handihaler. Is not needng albuterol very often. If has another copd exacerbation will consider adding on additional agent on to his lama.  Type 2 diabetes mellitus A1C 7.4 from 6.8. Well within his goal of 8.0. Unsure of utility of continuing to check from this point given patient's age.  Seasonal allergies His symptoms seem consistent with seasonal allergies likely contributing to possible copd symptoms. Given his age will try zyzal as this is also on medicaid formulary. Will also give him script for mucinex DM for cough. This could potentially all be related to copd but this is unlikely given that he has no wheezing on exam. Return precautions given -  zyzal for seasonal allergies - return precautions given   Orders Placed This Encounter  Procedures  . HgB A1c    Meds ordered this encounter  Medications  . levocetirizine (XYZAL) 5 MG tablet    Sig: Take 1 tablet (5 mg total) by mouth every evening.    Dispense:  30 tablet    Refill:  0  . dextromethorphan-guaiFENesin (MUCINEX DM) 30-600 MG 12hr tablet    Sig: Take 1 tablet by mouth 2 (two) times daily.    Dispense:  30 tablet    Refill:  0     Guadalupe Dawn MD PGY-2 Family Medicine Resident  09/11/2017 11:04 AM

## 2017-09-11 NOTE — Assessment & Plan Note (Signed)
Seems to be doing well due to his spiriva handihaler. Is not needng albuterol very often. If has another copd exacerbation will consider adding on additional agent on to his lama.

## 2017-09-11 NOTE — Assessment & Plan Note (Signed)
His symptoms seem consistent with seasonal allergies likely contributing to possible copd symptoms. Given his age will try zyzal as this is also on medicaid formulary. Will also give him script for mucinex DM for cough. This could potentially all be related to copd but this is unlikely given that he has no wheezing on exam. Return precautions given - zyzal for seasonal allergies - return precautions given

## 2017-09-11 NOTE — Assessment & Plan Note (Signed)
A1C 7.4 from 6.8. Well within his goal of 8.0. Unsure of utility of continuing to check from this point given patient's age.

## 2017-10-05 ENCOUNTER — Other Ambulatory Visit: Payer: Self-pay | Admitting: Family Medicine

## 2017-10-05 ENCOUNTER — Other Ambulatory Visit: Payer: Self-pay | Admitting: *Deleted

## 2017-10-05 MED ORDER — LEVOCETIRIZINE DIHYDROCHLORIDE 5 MG PO TABS: 5.0000 mg | ORAL_TABLET | Freq: Every evening | ORAL | 0 refills | Status: DC

## 2017-10-05 MED ORDER — LEVOCETIRIZINE DIHYDROCHLORIDE 5 MG PO TABS: 5.0000 mg | ORAL_TABLET | Freq: Every evening | ORAL | 1 refills | Status: DC

## 2017-10-20 ENCOUNTER — Other Ambulatory Visit: Payer: Self-pay

## 2017-10-20 MED ORDER — TIOTROPIUM BROMIDE MONOHYDRATE 18 MCG IN CAPS: ORAL_CAPSULE | RESPIRATORY_TRACT | 6 refills | Status: DC

## 2017-10-26 ENCOUNTER — Other Ambulatory Visit: Payer: Self-pay

## 2017-10-26 MED ORDER — ALLOPURINOL 300 MG PO TABS: 300.0000 mg | ORAL_TABLET | Freq: Every day | ORAL | 6 refills | Status: DC

## 2017-11-09 DIAGNOSIS — Z961 Presence of intraocular lens: Secondary | ICD-10-CM | POA: Diagnosis not present

## 2017-11-09 DIAGNOSIS — H18413 Arcus senilis, bilateral: Secondary | ICD-10-CM | POA: Diagnosis not present

## 2017-11-09 DIAGNOSIS — H10413 Chronic giant papillary conjunctivitis, bilateral: Secondary | ICD-10-CM | POA: Diagnosis not present

## 2017-11-09 DIAGNOSIS — H11153 Pinguecula, bilateral: Secondary | ICD-10-CM | POA: Diagnosis not present

## 2017-11-09 DIAGNOSIS — H11423 Conjunctival edema, bilateral: Secondary | ICD-10-CM | POA: Diagnosis not present

## 2017-11-09 DIAGNOSIS — H04123 Dry eye syndrome of bilateral lacrimal glands: Secondary | ICD-10-CM | POA: Diagnosis not present

## 2017-11-09 DIAGNOSIS — H1045 Other chronic allergic conjunctivitis: Secondary | ICD-10-CM | POA: Diagnosis not present

## 2017-11-09 DIAGNOSIS — Z9849 Cataract extraction status, unspecified eye: Secondary | ICD-10-CM | POA: Diagnosis not present

## 2017-11-09 DIAGNOSIS — H11823 Conjunctivochalasis, bilateral: Secondary | ICD-10-CM | POA: Diagnosis not present

## 2017-11-10 ENCOUNTER — Encounter: Payer: Self-pay | Admitting: Family Medicine

## 2017-11-10 ENCOUNTER — Other Ambulatory Visit: Payer: Self-pay

## 2017-11-10 ENCOUNTER — Ambulatory Visit (INDEPENDENT_AMBULATORY_CARE_PROVIDER_SITE_OTHER): Payer: PPO | Admitting: Family Medicine

## 2017-11-10 VITALS — BP 120/62 | HR 103 | Temp 97.9°F | Ht 71.0 in | Wt 184.2 lb

## 2017-11-10 DIAGNOSIS — M170 Bilateral primary osteoarthritis of knee: Secondary | ICD-10-CM | POA: Diagnosis not present

## 2017-11-10 DIAGNOSIS — Z0000 Encounter for general adult medical examination without abnormal findings: Secondary | ICD-10-CM | POA: Diagnosis not present

## 2017-11-10 DIAGNOSIS — Z23 Encounter for immunization: Secondary | ICD-10-CM

## 2017-11-10 MED ORDER — METHYLPREDNISOLONE ACETATE 40 MG/ML IJ SUSP
40.0000 mg | Freq: Once | INTRAMUSCULAR | Status: AC
  Administered 2017-11-10: 40 mg via INTRAMUSCULAR

## 2017-11-10 NOTE — Assessment & Plan Note (Signed)
Known bilateral lower extremity osteoarthritis in knees.  Has failed more conservative management form of Voltaren gel and Tylenol.  For bilateral lower extremity knee injections, please see procedure note.  Tolerated procedure well, pain resolved.

## 2017-11-10 NOTE — Patient Instructions (Signed)
It was great seeing you again today [name]!  Today we performed a a knee joint injection on both knees.  Everything went well, there were no immediate complications.  For the next couple days please change the Band-Aid.  Also do not get any bodies of water for the next to 3 days.  If any questions or concerns please come back and see me.

## 2017-11-10 NOTE — Progress Notes (Signed)
Procedure note  Procedure: Bilateral lower extremity knee injections Performing physician: Dr. Guadalupe Dawn Supervising physician: Dr. Sherren Mocha McDiarmid Pre-procedure diagnosis: Bilateral lower extremity knee osteoarthritis Procedure diagnosis: Bilateral lower extremity knee osteoarthritis  Procedure: Patient sitting on exam table, with bilateral knees exposed.  Knees observed, no abnormal anatomy found.  Patient was turned to the right knee.  Tibial plateau right lateral knee palpated.  Elta [place] was placed in this area to signify the injection site.  Area cleaned thoroughly with iodine, and alcohol swab.  Area sprayed with topical lidocaine spray, for anesthetic.  5 mL of lidocaine and Depo-Medrol mixture (4 and 1) injected into right knee lateral space, and medially.  Needle withdrawn, no blood seen, Band-Aid placed over area.  Attention turned to left knee.  Marker placed in the sterilized in same fashion as above.  5 mL of lidocaine and Depo-Medrol mixture injected into similar space and left knee.  Again needle withdrawn with no bleeding, Band-Aid placed.  No immediate complications noted, patient expressed some relief medially after injection.  Guadalupe Dawn MD PGY-2 Family Medicine Resident

## 2017-11-10 NOTE — Progress Notes (Signed)
   HPI 84 year old who presents for bilateral knee injections.  Patient has known chronic osteoarthritis.  He states that his knee pain has been limiting his mobility for the last couple weeks.  It is slowly getting worse, he feels it a lot when he tries to get out of a chair.  He has had several knee injections in the past, he states that these usually work very well for his pain.  He feels that his tenderness is all "deep inside the joint".  He has taken some Tylenol to help with the pain, this has not helped very much.  He is also tried to apply Voltaren gel, but this also does not help very much.  CC: Bilateral knee pain   ROS:   Review of Systems See HPI for ROS.   CC, SH/smoking status, and VS noted  Objective: BP 120/62   Pulse (!) 103   Temp 97.9 F (36.6 C) (Oral)   Ht 5\' 11"  (1.803 m)   Wt 184 lb 3.2 oz (83.6 kg)   SpO2 96%   BMI 25.69 kg/m  Gen: NAD, alert, cooperative, and pleasant.  Well-appearing, elderly, African-American male.  Sitting comfortably in chair. CV: RRR, no murmur.  Warm, well-perfused extremities Resp: CTAB, no wheezes, non-labored BLE: Tenderness to palpation bilateral knees, pain more localized to lateral aspects but is present in the medial.  Slight grinding felt on the extension bilaterally. Neuro: Alert and oriented, Speech clear, No gross deficits.   Assessment and plan:  Healthcare maintenance Received flu vaccine today.  Performed foot exam.   Osteoarthritis of both knees Known bilateral lower extremity osteoarthritis in knees.  Has failed more conservative management form of Voltaren gel and Tylenol.  For bilateral lower extremity knee injections, please see procedure note.  Tolerated procedure well, pain resolved.   Orders Placed This Encounter  Procedures  . Flu Vaccine QUAD 36+ mos IM    Meds ordered this encounter  Medications  . methylPREDNISolone acetate (DEPO-MEDROL) injection 40 mg  . methylPREDNISolone acetate  (DEPO-MEDROL) injection 40 mg     Guadalupe Dawn MD PGY-2 Family Medicine Resident  11/10/2017 11:06 AM

## 2017-11-10 NOTE — Assessment & Plan Note (Signed)
Received flu vaccine today.  Performed foot exam.

## 2017-11-15 ENCOUNTER — Other Ambulatory Visit: Payer: Self-pay | Admitting: Family Medicine

## 2017-11-26 ENCOUNTER — Other Ambulatory Visit: Payer: Self-pay | Admitting: Family Medicine

## 2017-12-19 ENCOUNTER — Other Ambulatory Visit: Payer: Self-pay | Admitting: Family Medicine

## 2018-01-01 ENCOUNTER — Other Ambulatory Visit: Payer: Self-pay

## 2018-01-01 ENCOUNTER — Ambulatory Visit (INDEPENDENT_AMBULATORY_CARE_PROVIDER_SITE_OTHER): Payer: PPO

## 2018-01-01 VITALS — BP 134/84 | HR 92 | Temp 98.0°F | Ht 71.0 in | Wt 188.8 lb

## 2018-01-01 DIAGNOSIS — Z0000 Encounter for general adult medical examination without abnormal findings: Secondary | ICD-10-CM

## 2018-01-01 DIAGNOSIS — M170 Bilateral primary osteoarthritis of knee: Secondary | ICD-10-CM

## 2018-01-01 NOTE — Telephone Encounter (Signed)
Patient in for AWV. Needs voltaren gel refilled. Also wants to know if he can take Xyzal BID as it wears off in early afternoon.  Also, patient has had two pneumonia vaccines but wonders if he should get again since he had pneumonia this year.  [name], RN [facility] RN)

## 2018-01-01 NOTE — Patient Instructions (Addendum)
[name] , Thank you for taking time to come for your Medicare Wellness Visit. I appreciate your ongoing commitment to your health goals. Please review the following plan we discussed and let me know if I can assist you in the future.   Please keep your appointment with Dr. Andria [name] on 01/04/18 at 10:50am.  These are the goals we discussed: Goals    . Eat more fruits and vegetables    . Exercise 1x per week (30 min per time)    . HEMOGLOBIN A1C < 7.0    . LDL CALC < 100       This is a list of the screening recommended for you and due dates:  Health Maintenance  Topic Date Due  . Urine Protein Check  04/17/2015  . DEXA scan (bone density measurement)  01/02/2019*  . Hemoglobin A1C  03/08/2018  . Tetanus Vaccine  07/24/2018  . Colon Cancer Screening  10/04/2018  . Complete foot exam   11/11/2018  . Eye exam for diabetics  12/26/2018  . Flu Shot  Completed  . Pneumonia vaccines  Completed  *Topic was postponed. The date shown is not the original due date.    Diabetes and Foot Care Diabetes may cause you to have problems because of poor blood supply (circulation) to your feet and legs. This may cause the skin on your feet to become thinner, break easier, and heal more slowly. Your skin may become dry, and the skin may peel and crack. You may also have nerve damage in your legs and feet causing decreased feeling in them. You may not notice minor injuries to your feet that could lead to infections or more serious problems. Taking care of your feet is one of the most important things you can do for yourself. Follow these instructions at home:  Wear shoes at all times, even in the house. Do not go barefoot. Bare feet are easily injured.  Check your feet daily for blisters, cuts, and redness. If you cannot see the bottom of your feet, use a mirror or ask someone for help.  Wash your feet with warm water (do not use hot water) and mild soap. Then pat your feet and the areas between  your toes until they are completely dry. Do not soak your feet as this can dry your skin.  Apply a moisturizing lotion or petroleum jelly (that does not contain alcohol and is unscented) to the skin on your feet and to dry, brittle toenails. Do not apply lotion between your toes.  Trim your toenails straight across. Do not dig under them or around the cuticle. File the edges of your nails with an emery board or nail file.  Do not cut corns or calluses or try to remove them with medicine.  Wear clean socks or stockings every day. Make sure they are not too tight. Do not wear knee-high stockings since they may decrease blood flow to your legs.  Wear shoes that fit properly and have enough cushioning. To break in new shoes, wear them for just a few hours a day. This prevents you from injuring your feet. Always look in your shoes before you put them on to be sure there are no objects inside.  Do not cross your legs. This may decrease the blood flow to your feet.  If you find a minor scrape, cut, or break in the skin on your feet, keep it and the skin around it clean and dry. These areas may be  cleansed with mild soap and water. Do not cleanse the area with peroxide, alcohol, or iodine.  When you remove an adhesive bandage, be sure not to damage the skin around it.  If you have a wound, look at it several times a day to make sure it is healing.  Do not use heating pads or hot water bottles. They may burn your skin. If you have lost feeling in your feet or legs, you may not know it is happening until it is too late.  Make sure your health care provider performs a complete foot exam at least annually or more often if you have foot problems. Report any cuts, sores, or bruises to your health care provider immediately. Contact a health care provider if:  You have an injury that is not healing.  You have cuts or breaks in the skin.  You have an ingrown nail.  You notice redness on your legs or  feet.  You feel burning or tingling in your legs or feet.  You have pain or cramps in your legs and feet.  Your legs or feet are numb.  Your feet always feel cold. Get help right away if:  There is increasing redness, swelling, or pain in or around a wound.  There is a red line that goes up your leg.  Pus is coming from a wound.  You develop a fever or as directed by your health care provider.  You notice a bad smell coming from an ulcer or wound. This information is not intended to replace advice given to you by your health care provider. Make sure you discuss any questions you have with your health care provider. Document Released: 02/19/2000 Document Revised: 07/30/2015 Document Reviewed: 07/31/2012 Elsevier Interactive Patient Education  2017 [place] Prevention in the Home Falls can cause injuries. They can happen to people of all ages. There are many things you can do to make your home safe and to help prevent falls. What can I do on the outside of my home?  Regularly fix the edges of walkways and driveways and fix any cracks.  Remove anything that might make you trip as you walk through a door, such as a raised step or threshold.  Trim any bushes or trees on the path to your home.  Use bright outdoor lighting.  Clear any walking paths of anything that might make someone trip, such as rocks or tools.  Regularly check to see if handrails are loose or broken. Make sure that both sides of any steps have handrails.  Any raised decks and porches should have guardrails on the edges.  Have any leaves, snow, or ice cleared regularly.  Use sand or salt on walking paths during winter.  Clean up any spills in your garage right away. This includes oil or grease spills. What can I do in the bathroom?  Use night lights.  Install grab bars by the toilet and in the tub and shower. Do not use towel bars as grab bars.  Use non-skid mats or decals in the tub or  shower.  If you need to sit down in the shower, use a plastic, non-slip stool.  Keep the floor dry. Clean up any water that spills on the floor as soon as it happens.  Remove soap buildup in the tub or shower regularly.  Attach bath mats securely with double-sided non-slip rug tape.  Do not have throw rugs and other things on the floor that can make  you trip. What can I do in the bedroom?  Use night lights.  Make sure that you have a light by your bed that is easy to reach.  Do not use any sheets or blankets that are too big for your bed. They should not hang down onto the floor.  Have a firm chair that has side arms. You can use this for support while you get dressed.  Do not have throw rugs and other things on the floor that can make you trip. What can I do in the kitchen?  Clean up any spills right away.  Avoid walking on wet floors.  Keep items that you use a lot in easy-to-reach places.  If you need to reach something above you, use a strong step stool that has a grab bar.  Keep electrical cords out of the way.  Do not use floor polish or wax that makes floors slippery. If you must use wax, use non-skid floor wax.  Do not have throw rugs and other things on the floor that can make you trip. What can I do with my stairs?  Do not leave any items on the stairs.  Make sure that there are handrails on both sides of the stairs and use them. Fix handrails that are broken or loose. Make sure that handrails are as long as the stairways.  Check any carpeting to make sure that it is firmly attached to the stairs. Fix any carpet that is loose or worn.  Avoid having throw rugs at the top or bottom of the stairs. If you do have throw rugs, attach them to the floor with carpet tape.  Make sure that you have a light switch at the top of the stairs and the bottom of the stairs. If you do not have them, ask someone to add them for you. What else can I do to help prevent  falls?  Wear shoes that: ? Do not have high heels. ? Have rubber bottoms. ? Are comfortable and fit you well. ? Are closed at the toe. Do not wear sandals.  If you use a stepladder: ? Make sure that it is fully opened. Do not climb a closed stepladder. ? Make sure that both sides of the stepladder are locked into place. ? Ask someone to hold it for you, if possible.  Clearly mark and make sure that you can see: ? Any grab bars or handrails. ? First and last steps. ? Where the edge of each step is.  Use tools that help you move around (mobility aids) if they are needed. These include: ? Canes. ? Walkers. ? Scooters. ? Crutches.  Turn on the lights when you go into a dark area. Replace any light bulbs as soon as they burn out.  Set up your furniture so you have a clear path. Avoid moving your furniture around.  If any of your floors are uneven, fix them.  If there are any pets around you, be aware of where they are.  Review your medicines with your doctor. Some medicines can make you feel dizzy. This can increase your chance of falling. Ask your doctor what other things that you can do to help prevent falls. This information is not intended to replace advice given to you by your health care provider. Make sure you discuss any questions you have with your health care provider. Document Released: 12/18/2008 Document Revised: 07/30/2015 Document Reviewed: 03/28/2014 Elsevier Interactive Patient Education  [employer].

## 2018-01-01 NOTE — Progress Notes (Signed)
Subjective:   [name] is a 84 y.o. male who presents for Medicare Annual/Subsequent preventive examination. The patient was informed that the wellness visit is to identify future health risk and educate and initiate measures that can reduce risk for increased disease through the lifespan.   Review of Systems:  Physical assessment deferred to PCP.  Cardiac Risk Factors include: advanced age (>29men, >66 women);diabetes mellitus;hypertension;male gender     Objective:    Vitals: BP 134/84   Pulse 92   Temp 98 F (36.7 C) (Oral)   Ht 5\' 11"  (1.803 m)   Wt 188 lb 12.8 oz (85.6 kg)   SpO2 95%   BMI 26.33 kg/m   Body mass index is 26.33 kg/m.  Advanced Directives 01/01/2018 11/10/2017 11/10/2017 09/05/2017 09/05/2017 07/18/2017 07/17/2017  Does Patient Have a Medical Advance Directive? Yes No No No No Yes Yes  Type of Advance Directive Living will - - - - Living will Living will  Does patient want to make changes to medical advance directive? - - - - - No - Patient declined -  Copy of [place] in [place]  Would patient like information on creating a medical advance directive? No - Patient declined No - Patient declined No - Patient declined No - Patient declined No - Patient declined No - Patient declined No - Patient declined    Tobacco Social History   Tobacco Use  Smoking Status Former Smoker  . Packs/day: 0.50  . Years: 60.00  . Pack years: 30.00  . Types: Cigars, Cigarettes  . Start date: 03[birth year]08/1949  . Last attempt to quit: 06/08/2011  . Years since quitting: 6.5  Smokeless Tobacco Never Used  Tobacco Comment   no plans to start     Counseling given: Yes Comment: no plans to start   Clinical Intake:  Pre-visit preparation completed: No  Pain : 0-10 Pain Score: 6  Pain Location: Foot Pain Orientation: Right, Left Pain Onset: In the past 7 days     Nutritional Status: BMI 25 -29 Overweight Diabetes: Yes CBG done?:  No Did pt. bring in CBG monitor from home?: No  How often do you need to have someone help you when you read instructions, pamphlets, or other written materials from your doctor or pharmacy?: 1 - Never What is the last grade level you completed in school?: 10th grade  Interpreter Needed?: No     Past Medical History:  Diagnosis Date  . ANEMIA, IRON DEF, NOS 01/15/2007   Qualifier: Diagnosis of  By: Jeannine Kitten MD, Rodman Key    . Arthritis   . Cataract extraction status of left eye 2016  . Cataract extraction status of right eye 2016  . CHOLECYSTECTOMY, LAPAROSCOPIC, HX OF 04/12/2007   Qualifier: Diagnosis of  By: Netty Starring  MD, Lucianne Muss    . Diabetes mellitus without complication ([place])   . Gout 2007  . Hypertension    Past Surgical History:  Procedure Laterality Date  . CHOLECYSTECTOMY     Family History  Problem Relation Age of Onset  . Asthma Mother   . Heart disease Brother   . Stroke Brother   . Diabetes Brother   . Cancer Brother        colon  . Arthritis Sister   . Arthritis Daughter   . Stroke Brother   . Heart disease Brother   . Alcohol abuse Sister   . Cirrhosis Sister   . Asthma Sister   .  Diabetes Son   . Asthma Daughter    Social History   Socioeconomic History  . Marital status: Married    Spouse name: Enid Derry  . Number of children: 9  . Years of education: 10  . Highest education level: 10th grade  Occupational History  . Occupation: RetiredAnimal nutritionist Work  Social Needs  . Financial resource strain: Somewhat hard  . Food insecurity:    Worry: Sometimes true    Inability: Sometimes true  . Transportation needs:    Medical: No    Non-medical: No  Tobacco Use  . Smoking status: Former Smoker    Packs/day: 0.50    Years: 60.00    Pack years: 30.00    Types: Cigars, Cigarettes    Start date: [birth year]08/1949    Last attempt to quit: 06/08/2011    Years since quitting: 6.5  . Smokeless tobacco: Never Used  . Tobacco comment: no plans to start  Substance  and Sexual Activity  . Alcohol use: No  . Drug use: No  . Sexual activity: Not Currently  Lifestyle  . Physical activity:    Days per week: 0 days    Minutes per session: 0 min  . Stress: Not at all  Relationships  . Social connections:    Talks on phone: More than three times a week    Gets together: Three times a week    Attends religious service: Never    Active member of club or organization: No    Attends meetings of clubs or organizations: Never    Relationship status: Married  Other Topics Concern  . Not on file  Social History Narrative   Health Care POA:    Emergency Contact: wife, Fitzpatrick Alberico, [phone]   End of Life Plan:    Who lives with you: wife, step daughter, granddaughter . House has two levels, has trouble going up and down stairs due to knee pain and foot pain      Any pets: none   Diet: Patient has a varied diet and reports eating bacon and eggs every morning. Does not like most fruit. Likes flavored water, kool-aid.   No current exercise.     Seatbelts: Pt reports wearing seatbelt when in vehicle.   Hobbies: likes to watch [employer]                Outpatient Encounter Medications as of 01/01/2018  Medication Sig  . acetaminophen (TYLENOL) 325 MG tablet Take 2 tablets (650 mg total) by mouth every 6 (six) hours as needed for moderate pain.  Marland Kitchen albuterol (PROVENTIL HFA;VENTOLIN HFA) 108 (90 Base) MCG/ACT inhaler Inhale 2 puffs into the lungs every 6 (six) hours as needed for wheezing or shortness of breath.  Marland Kitchen albuterol (PROVENTIL) (2.5 MG/3ML) 0.083% nebulizer solution Take 3 mLs (2.5 mg total) by nebulization every 6 (six) hours as needed for wheezing or shortness of breath.  . allopurinol (ZYLOPRIM) 300 MG tablet Take 1 tablet (300 mg total) by mouth daily.  . cycloSPORINE (RESTASIS) 0.05 % ophthalmic emulsion 1 drop 2 (two) times daily.  . diclofenac sodium (VOLTAREN) 1 % GEL APPLY 2 GRAMS TO AFFECTED AREA 4 TIMES A DAY  . Fluticasone-Salmeterol  (ADVAIR DISKUS) 500-50 MCG/DOSE AEPB TAKE 1 PUFF BY MOUTH TWICE A DAY  . levocetirizine (XYZAL) 5 MG tablet TAKE 1 TABLET BY MOUTH EVERY DAY IN THE EVENING  . Lifitegrast (XIIDRA OP) Apply to eye.  Marland Kitchen olopatadine (PATANOL) 0.1 % ophthalmic solution PLACE 1 DROP IN [facility]  EYE TWICE A DAY  . Polyethyl Glycol-Propyl Glycol (SYSTANE OP) Apply to eye.  . tiotropium (SPIRIVA HANDIHALER) 18 MCG inhalation capsule PLACE 1 CAPSULE (18 MCG TOTAL) INTO INHALER AND INHALE DAILY.  . cetirizine (ZYRTEC) 10 MG tablet TAKE 1 TABLET BY MOUTH ONCE DAILY (Patient not taking: Reported on 01/01/2018)  . dextromethorphan-guaiFENesin (MUCINEX DM) 30-600 MG 12hr tablet Take 1 tablet by mouth 2 (two) times daily. (Patient not taking: Reported on 01/01/2018)  . glucose blood (ONE TOUCH ULTRA TEST) test strip Use as instructed.  Dispense:   Quantity sufficient for one month supply of once daily testing. (Patient not taking: Reported on 11/10/2015)  . Lancets (ONETOUCH ULTRASOFT) lancets Use as instructed.  Dispense Quantity Sufficient for one month supply of once daily testing. (Patient not taking: Reported on 11/10/2015)  . triamcinolone ointment (KENALOG) 0.1 % Apply 1 application topically 2 (two) times daily. (Patient not taking: Reported on 07/17/2017)   Facility-Administered Encounter Medications as of 01/01/2018  Medication  . albuterol (PROVENTIL) (2.5 MG/3ML) 0.083% nebulizer solution 2.5 mg  . ipratropium (ATROVENT) nebulizer solution 0.5 mg    Activities of Daily Living In your present state of health, do you have any difficulty performing the following activities: 01/01/2018 07/18/2017  Hearing? Tempie Donning  Vision? N N  Difficulty concentrating or making decisions? N N  Walking or climbing stairs? Y N  Dressing or bathing? N N  Doing errands, shopping? N N  Preparing Food and eating ? N -  Using the Toilet? N -  In the past six months, have you accidently leaked urine? N -  Do you have problems with loss of bowel  control? N -  Managing your Medications? N -  Managing your Finances? N -  Housekeeping or managing your Housekeeping? N -  Some recent data might be hidden   Patient Care Team: Guadalupe Dawn, MD as PCP - General (Family Medicine) Brunetta Genera, MD as Consulting Physician (Hematology) Shirley Muscat Loreen Freud, MD as Referring Physician (Optometry)   Assessment:   This is a routine wellness examination for Sy.  Exercise Activities and Dietary recommendations Current Exercise Habits: The patient does not participate in regular exercise at present, Exercise limited by: orthopedic condition(s)  Goals    . Eat more fruits and vegetables    . Exercise 1x per week (30 min per time)    . HEMOGLOBIN A1C < 7.0    . LDL CALC < 100       Fall Risk Fall Risk  01/01/2018 11/10/2017 11/10/2017 09/05/2017 06/30/2017  Falls in the past year? No No No No No  Number falls in past yr: - - - - -  Injury with Fall? - - - - -  Risk for fall due to : - - - - -  Risk for fall due to: Comment - - - - -   Is the patient's home free of loose throw rugs in walkways, pet beds, electrical cords, etc?   yes      Grab bars in the bathroom? no      Handrails on the stairs?   yes      Adequate lighting?   yes   Depression Screen PHQ 2/9 Scores 01/01/2018 11/10/2017 11/10/2017 09/05/2017  PHQ - 2 Score 0 0 0 0  PHQ- 9 Score - - - -    Cognitive Function MMSE - Mini Mental State Exam 01/01/2018 12/24/2012 10/11/2011 09/13/2010  Orientation to time 5 5 5 5   Orientation to Place  5 5 5 5   Registration 3 3 3 3   Attention/ Calculation 5 5 3 5   Recall 3 3 3 3   Language- name 2 objects 2 2 2 2   Language- repeat 1 1 1 1   Language- follow 3 step command 3 3 3 3   Language- read & follow direction 1 1 1 1   Write a sentence 1 0 1 1  Copy design 1 1 1 1   Total score 30 29 28 30      6CIT Screen 01/01/2018  What Year? 0 points  What month? 0 points  What time? 0 points  Count back from 20 0 points  Months in  reverse 0 points  Repeat phrase 0 points  Total Score 0    Immunization History  Administered Date(s) Administered  . Influenza Split 12/29/2010, 12/06/2011  . Influenza Whole 01/16/2009  . Influenza,inj,Quad PF,6+ Mos 12/24/2012, 12/16/2013, 12/12/2014, 12/07/2015, 12/29/2016, 11/10/2017  . Pneumococcal Conjugate-13 10/24/2007  . Pneumococcal Polysaccharide-23 07/11/2011  . Tdap 07/23/2008    Screening Tests Health Maintenance  Topic Date Due  . URINE MICROALBUMIN  04/17/2015  . DEXA SCAN  01/02/2019 (Originally 07/25/2000)  . HEMOGLOBIN A1C  03/08/2018  . TETANUS/TDAP  07/24/2018  . COLONOSCOPY  10/04/2018  . FOOT EXAM  11/11/2018  . OPHTHALMOLOGY EXAM  12/26/2018  . INFLUENZA VACCINE  Completed  . PNA vac Low Risk Adult  Completed   Cancer Screenings: Lung: Low Dose CT Chest recommended if Age 66-80 years, 30 pack-year currently smoking OR have quit w/in 15years. Patient does qualify. Colorectal: N/A      Plan:  Patient expressed difficulty around financial obligations regarding bills, medications, hearing aids, etc. Will ask LCSW to contact to review community resources.  Patient having new problem with bilateral foot burning pain. Appt made with another provider this week to evaluate.  I have personally reviewed and noted the following in the patient's chart:   . Medical and social history . Use of alcohol, tobacco or illicit drugs  . Current medications and supplements . Functional ability and status . Nutritional status . Physical activity . Advanced directives . List of other physicians . Hospitalizations, surgeries, and ER visits in previous 12 months . Vitals . Screenings to include cognitive, depression, and falls . Referrals and appointments  In addition, I have reviewed and discussed with patient certain preventive protocols, quality metrics, and best practice recommendations. A written personalized care plan for preventive services as well as general  preventive health recommendations were provided to patient.     Esau Grew, RN  01/01/2018

## 2018-01-03 MED ORDER — DICLOFENAC SODIUM 1 % TD GEL: TRANSDERMAL | 0 refills | Status: DC

## 2018-01-03 NOTE — Telephone Encounter (Signed)
Refilled voltaren gel.   Patient can take the xyzal BID if needed, but this can cause increased sleepiness.  No extra benefit to getting another pneumonia vaccine  Please let patient know I have refilled his voltaren gel.  Guadalupe Dawn MD PGY-2 Family Medicine Resident

## 2018-01-03 NOTE — Telephone Encounter (Signed)
Pt informed. Kassi Esteve, CMA  

## 2018-01-04 ENCOUNTER — Encounter: Payer: Self-pay | Admitting: Family Medicine

## 2018-01-04 ENCOUNTER — Ambulatory Visit (INDEPENDENT_AMBULATORY_CARE_PROVIDER_SITE_OTHER): Payer: PPO | Admitting: Family Medicine

## 2018-01-04 ENCOUNTER — Telehealth: Payer: Self-pay | Admitting: Licensed Clinical Social Worker

## 2018-01-04 VITALS — BP 138/76 | HR 99 | Temp 97.9°F | Ht 71.0 in | Wt 190.6 lb

## 2018-01-04 DIAGNOSIS — E119 Type 2 diabetes mellitus without complications: Secondary | ICD-10-CM

## 2018-01-04 DIAGNOSIS — E1165 Type 2 diabetes mellitus with hyperglycemia: Secondary | ICD-10-CM | POA: Diagnosis not present

## 2018-01-04 DIAGNOSIS — E1140 Type 2 diabetes mellitus with diabetic neuropathy, unspecified: Secondary | ICD-10-CM | POA: Diagnosis not present

## 2018-01-04 DIAGNOSIS — N183 Chronic kidney disease, stage 3 unspecified: Secondary | ICD-10-CM

## 2018-01-04 DIAGNOSIS — E785 Hyperlipidemia, unspecified: Secondary | ICD-10-CM

## 2018-01-04 DIAGNOSIS — IMO0002 Reserved for concepts with insufficient information to code with codable children: Secondary | ICD-10-CM

## 2018-01-04 LAB — POCT GLYCOSYLATED HEMOGLOBIN (HGB A1C): HbA1c, POC (controlled diabetic range): 7.5 % — AB (ref 0.0–7.0)

## 2018-01-04 MED ORDER — GABAPENTIN 100 MG PO CAPS: 100.0000 mg | ORAL_CAPSULE | Freq: Three times a day (TID) | ORAL | 3 refills | Status: DC

## 2018-01-04 MED ORDER — METFORMIN HCL 500 MG PO TABS: 500.0000 mg | ORAL_TABLET | Freq: Two times a day (BID) | ORAL | 3 refills | Status: DC

## 2018-01-04 NOTE — Assessment & Plan Note (Signed)
Would benefit from ACE or ARB.  Already starting two new meds today.

## 2018-01-04 NOTE — Telephone Encounter (Signed)
  °  [facility] intern phone call to patient after referral was made by Dr. Dayna Ramus for a call regarding resources. Patient stated that he was not interested in the resources at this time. [facility] intern left follow up information for patient to call if he needs resources at a later date.   Plan: 1. Patient will follow up with [facility] intern if any resources are needed.   Audry Riles, [place] intern Behavioral Health Clinician,  [place] [phone]

## 2018-01-04 NOTE — Progress Notes (Signed)
   Subjective:    Patient ID: [name], adult    DOB: 27-Feb-1934, 84 y.o.   MRN: [id number]  HPI C/O burning and tingling of both feet.   Patient had bad back and right sided radiculopathy "for years"  Now feels pain in both feet.  Numbness and tingling.  Diabetes for greater than 5 years, largely well controled.  Now not on meds.  No trauma  DM A1C is up.  Wife is having significant medical troubles with msk pain.  He is not eating as healthy and [facility]    Of note, he has CKD 3 by recent labs and is not on ACE or ARB  "I hope to get another 10 years."  Not on statin.    Review of Systems     Objective:   Physical Exam Foot exam - see quality metrics diabetic foot exam. Neuropathy.        Assessment & Plan:

## 2018-01-04 NOTE — Assessment & Plan Note (Signed)
REstart meds/metformin  Also gabapentin for neuropathy.  Likely would benefit from statin as a diabetic.

## 2018-01-04 NOTE — Progress Notes (Signed)
He did agree to it. His wife was also there and said they would welcome the call. Thanks for trying.

## 2018-01-04 NOTE — BH Specialist Note (Deleted)
° °  MRN: [id number] Name: [name]   Type of Service: Clinical Social Work   [facility] intern phone call to patient after referral was made by Dr. Dayna Ramus for a call regarding resources. Patient stated that he was not interested in the resources at this time. [facility] intern left follow up information for patient to call if he needs resources at a later date.   Plan: 1. Patient will follow up with [facility] intern if any resources are needed.   Audry Riles, [place] intern Behavioral Health Clinician,  [place] [phone]

## 2018-01-04 NOTE — Assessment & Plan Note (Signed)
Would benefit from statin.

## 2018-01-04 NOTE — Progress Notes (Signed)
po

## 2018-01-04 NOTE — Patient Instructions (Addendum)
I sent in two new medications. Metformin is to bring the diabetes under better control.  Of course, you can also help by eating better and [facility].   The gabapentin is to help with the bad feelings in your feet.  You can take it up to three times per day. Please see your regular doctor, Dr. Kris Mouton, in one month.  Two other medicines might help you live longer.  A cholesterol medicine and a medicine to protect your kidneys.

## 2018-01-25 ENCOUNTER — Other Ambulatory Visit: Payer: Self-pay | Admitting: Family Medicine

## 2018-01-25 DIAGNOSIS — M170 Bilateral primary osteoarthritis of knee: Secondary | ICD-10-CM

## 2018-01-26 ENCOUNTER — Other Ambulatory Visit: Payer: Self-pay | Admitting: Family Medicine

## 2018-01-26 DIAGNOSIS — E119 Type 2 diabetes mellitus without complications: Secondary | ICD-10-CM

## 2018-01-26 DIAGNOSIS — E1165 Type 2 diabetes mellitus with hyperglycemia: Secondary | ICD-10-CM

## 2018-01-26 DIAGNOSIS — E1140 Type 2 diabetes mellitus with diabetic neuropathy, unspecified: Secondary | ICD-10-CM

## 2018-01-26 DIAGNOSIS — IMO0002 Reserved for concepts with insufficient information to code with codable children: Secondary | ICD-10-CM

## 2018-02-16 ENCOUNTER — Other Ambulatory Visit: Payer: Self-pay | Admitting: Family Medicine

## 2018-02-21 ENCOUNTER — Ambulatory Visit: Payer: PPO | Admitting: Family Medicine

## 2018-02-22 ENCOUNTER — Other Ambulatory Visit: Payer: Self-pay | Admitting: Family Medicine

## 2018-02-22 DIAGNOSIS — M170 Bilateral primary osteoarthritis of knee: Secondary | ICD-10-CM

## 2018-03-18 ENCOUNTER — Other Ambulatory Visit: Payer: Self-pay | Admitting: Family Medicine

## 2018-04-07 ENCOUNTER — Other Ambulatory Visit: Payer: Self-pay | Admitting: Family Medicine

## 2018-04-07 DIAGNOSIS — M170 Bilateral primary osteoarthritis of knee: Secondary | ICD-10-CM

## 2018-04-12 ENCOUNTER — Other Ambulatory Visit: Payer: Self-pay | Admitting: Family Medicine

## 2018-04-13 ENCOUNTER — Telehealth: Payer: Self-pay

## 2018-04-13 NOTE — Telephone Encounter (Signed)
Fax from [facility] that Advair Diskus is non-preferred.  Preferred alternatives are:   Symbicort,  Breo, Ipratropium  Please send a preferred alternative.  [name], RN [facility] RN)

## 2018-04-15 ENCOUNTER — Emergency Department (HOSPITAL_COMMUNITY): Payer: PPO

## 2018-04-15 ENCOUNTER — Other Ambulatory Visit: Payer: Self-pay

## 2018-04-15 ENCOUNTER — Inpatient Hospital Stay (HOSPITAL_COMMUNITY)
Admission: EM | Admit: 2018-04-15 | Discharge: 2018-04-18 | DRG: 871 | Disposition: A | Discharge status: Home Health | Payer: PPO | Attending: Family Medicine | Admitting: Family Medicine

## 2018-04-15 DIAGNOSIS — M109 Gout, unspecified: Secondary | ICD-10-CM | POA: Diagnosis present

## 2018-04-15 DIAGNOSIS — N179 Acute kidney failure, unspecified: Secondary | ICD-10-CM | POA: Diagnosis not present

## 2018-04-15 DIAGNOSIS — J440 Chronic obstructive pulmonary disease with acute lower respiratory infection: Secondary | ICD-10-CM | POA: Diagnosis present

## 2018-04-15 DIAGNOSIS — J189 Pneumonia, unspecified organism: Secondary | ICD-10-CM | POA: Diagnosis not present

## 2018-04-15 DIAGNOSIS — D649 Anemia, unspecified: Secondary | ICD-10-CM | POA: Diagnosis not present

## 2018-04-15 DIAGNOSIS — R197 Diarrhea, unspecified: Secondary | ICD-10-CM | POA: Diagnosis not present

## 2018-04-15 DIAGNOSIS — R6520 Severe sepsis without septic shock: Secondary | ICD-10-CM | POA: Diagnosis present

## 2018-04-15 DIAGNOSIS — Z825 Family history of asthma and other chronic lower respiratory diseases: Secondary | ICD-10-CM | POA: Diagnosis not present

## 2018-04-15 DIAGNOSIS — E785 Hyperlipidemia, unspecified: Secondary | ICD-10-CM | POA: Diagnosis not present

## 2018-04-15 DIAGNOSIS — Z7984 Long term (current) use of oral hypoglycemic drugs: Secondary | ICD-10-CM

## 2018-04-15 DIAGNOSIS — Z833 Family history of diabetes mellitus: Secondary | ICD-10-CM

## 2018-04-15 DIAGNOSIS — Z01818 Encounter for other preprocedural examination: Secondary | ICD-10-CM

## 2018-04-15 DIAGNOSIS — Z87891 Personal history of nicotine dependence: Secondary | ICD-10-CM | POA: Diagnosis not present

## 2018-04-15 DIAGNOSIS — J9601 Acute respiratory failure with hypoxia: Secondary | ICD-10-CM | POA: Diagnosis present

## 2018-04-15 DIAGNOSIS — E876 Hypokalemia: Secondary | ICD-10-CM | POA: Diagnosis not present

## 2018-04-15 DIAGNOSIS — Z823 Family history of stroke: Secondary | ICD-10-CM

## 2018-04-15 DIAGNOSIS — I471 Supraventricular tachycardia: Secondary | ICD-10-CM | POA: Diagnosis present

## 2018-04-15 DIAGNOSIS — N183 Chronic kidney disease, stage 3 (moderate): Secondary | ICD-10-CM | POA: Diagnosis not present

## 2018-04-15 DIAGNOSIS — R0689 Other abnormalities of breathing: Secondary | ICD-10-CM | POA: Diagnosis not present

## 2018-04-15 DIAGNOSIS — R000 Tachycardia, unspecified: Secondary | ICD-10-CM | POA: Diagnosis not present

## 2018-04-15 DIAGNOSIS — J9602 Acute respiratory failure with hypercapnia: Secondary | ICD-10-CM | POA: Diagnosis not present

## 2018-04-15 DIAGNOSIS — E1122 Type 2 diabetes mellitus with diabetic chronic kidney disease: Secondary | ICD-10-CM | POA: Diagnosis not present

## 2018-04-15 DIAGNOSIS — J449 Chronic obstructive pulmonary disease, unspecified: Secondary | ICD-10-CM | POA: Diagnosis not present

## 2018-04-15 DIAGNOSIS — R0902 Hypoxemia: Secondary | ICD-10-CM | POA: Diagnosis not present

## 2018-04-15 DIAGNOSIS — I129 Hypertensive chronic kidney disease with stage 1 through stage 4 chronic kidney disease, or unspecified chronic kidney disease: Secondary | ICD-10-CM | POA: Diagnosis present

## 2018-04-15 DIAGNOSIS — J42 Unspecified chronic bronchitis: Secondary | ICD-10-CM | POA: Diagnosis not present

## 2018-04-15 DIAGNOSIS — Z7951 Long term (current) use of inhaled steroids: Secondary | ICD-10-CM

## 2018-04-15 DIAGNOSIS — A419 Sepsis, unspecified organism: Secondary | ICD-10-CM | POA: Diagnosis not present

## 2018-04-15 DIAGNOSIS — D72829 Elevated white blood cell count, unspecified: Secondary | ICD-10-CM | POA: Diagnosis not present

## 2018-04-15 DIAGNOSIS — Z8249 Family history of ischemic heart disease and other diseases of the circulatory system: Secondary | ICD-10-CM | POA: Diagnosis not present

## 2018-04-15 DIAGNOSIS — R0602 Shortness of breath: Secondary | ICD-10-CM | POA: Diagnosis not present

## 2018-04-15 DIAGNOSIS — Z66 Do not resuscitate: Secondary | ICD-10-CM | POA: Diagnosis present

## 2018-04-15 DIAGNOSIS — R918 Other nonspecific abnormal finding of lung field: Secondary | ICD-10-CM | POA: Diagnosis not present

## 2018-04-15 DIAGNOSIS — R05 Cough: Secondary | ICD-10-CM | POA: Diagnosis not present

## 2018-04-15 LAB — COMPREHENSIVE METABOLIC PANEL
ALT: 30 U/L (ref 0–44)
AST: 37 U/L (ref 15–41)
Albumin: 3.4 g/dL — ABNORMAL LOW (ref 3.5–5.0)
Alkaline Phosphatase: 69 U/L (ref 38–126)
Anion gap: 14 (ref 5–15)
BUN: 28 mg/dL — ABNORMAL HIGH (ref 8–23)
CO2: 16 mmol/L — ABNORMAL LOW (ref 22–32)
Calcium: 9 mg/dL (ref 8.9–10.3)
Chloride: 107 mmol/L (ref 98–111)
Creatinine, Ser: 1.99 mg/dL — ABNORMAL HIGH (ref 0.61–1.24)
GFR calc Af Amer: 35 mL/min — ABNORMAL LOW (ref >60)
GFR calc non Af Amer: 30 mL/min — ABNORMAL LOW (ref >60)
Glucose, Bld: 165 mg/dL — ABNORMAL HIGH (ref 70–99)
Potassium: 3.9 mmol/L (ref 3.5–5.1)
Sodium: 137 mmol/L (ref 135–145)
Total Bilirubin: 1 mg/dL (ref 0.3–1.2)
Total Protein: 6.5 g/dL (ref 6.5–8.1)

## 2018-04-15 LAB — POCT I-STAT 7, (LYTES, BLD GAS, ICA,H+H)
Acid-base deficit: 9 mmol/L — ABNORMAL HIGH (ref 0.0–2.0)
Bicarbonate: 15.8 mmol/L — ABNORMAL LOW (ref 20.0–28.0)
Calcium, Ion: 1.18 mmol/L (ref 1.15–1.40)
HCT: 34 % — ABNORMAL LOW (ref 39.0–52.0)
Hemoglobin: 11.6 g/dL — ABNORMAL LOW (ref 13.0–17.0)
O2 Saturation: 99 %
Patient temperature: 99.8
Potassium: 3.9 mmol/L (ref 3.5–5.1)
Sodium: 141 mmol/L (ref 135–145)
TCO2: 17 mmol/L — ABNORMAL LOW (ref 22–32)
pCO2 arterial: 30.9 mmHg — ABNORMAL LOW (ref 32.0–48.0)
pH, Arterial: 7.321 — ABNORMAL LOW (ref 7.350–7.450)
pO2, Arterial: 154 mmHg — ABNORMAL HIGH (ref 83.0–108.0)

## 2018-04-15 LAB — CBC WITH DIFFERENTIAL/PLATELET
Abs Immature Granulocytes: 0.28 10*3/uL — ABNORMAL HIGH (ref 0.00–0.07)
Basophils Absolute: 0.1 10*3/uL (ref 0.0–0.1)
Basophils Relative: 0 %
Eosinophils Absolute: 0.1 10*3/uL (ref 0.0–0.5)
Eosinophils Relative: 0 %
HCT: 40.8 % (ref 39.0–52.0)
Hemoglobin: 14.2 g/dL (ref 13.0–17.0)
Immature Granulocytes: 1 %
Lymphocytes Relative: 5 %
Lymphs Abs: 1.1 10*3/uL (ref 0.7–4.0)
MCH: 26.7 pg (ref 26.0–34.0)
MCHC: 34.8 g/dL (ref 30.0–36.0)
MCV: 76.7 fL — ABNORMAL LOW (ref 80.0–100.0)
Monocytes Absolute: 1.7 10*3/uL — ABNORMAL HIGH (ref 0.1–1.0)
Monocytes Relative: 7 %
Neutro Abs: 21.6 10*3/uL — ABNORMAL HIGH (ref 1.7–7.7)
Neutrophils Relative %: 87 %
Platelets: 193 10*3/uL (ref 150–400)
RBC: 5.32 MIL/uL (ref 4.22–5.81)
RDW: 15.1 % (ref 11.5–15.5)
WBC: 24.7 10*3/uL — ABNORMAL HIGH (ref 4.0–10.5)
nRBC: 0 % (ref 0.0–0.2)

## 2018-04-15 LAB — URINALYSIS, ROUTINE W REFLEX MICROSCOPIC
Bilirubin Urine: NEGATIVE
Glucose, UA: NEGATIVE mg/dL
Hgb urine dipstick: NEGATIVE
Ketones, ur: NEGATIVE mg/dL
Leukocytes, UA: NEGATIVE
Nitrite: NEGATIVE
Protein, ur: NEGATIVE mg/dL
Specific Gravity, Urine: 1.019 (ref 1.005–1.030)
pH: 5 (ref 5.0–8.0)

## 2018-04-15 LAB — LACTIC ACID, PLASMA
Lactic Acid, Venous: 3.1 mmol/L (ref 0.5–1.9)
Lactic Acid, Venous: 0.9 mmol/L (ref 0.5–1.9)

## 2018-04-15 LAB — RESPIRATORY PANEL BY PCR

## 2018-04-15 LAB — GLUCOSE, CAPILLARY
Glucose-Capillary: 102 mg/dL — ABNORMAL HIGH (ref 70–99)
Glucose-Capillary: 102 mg/dL — ABNORMAL HIGH (ref 70–99)
Glucose-Capillary: 122 mg/dL — ABNORMAL HIGH (ref 70–99)
Glucose-Capillary: 158 mg/dL — ABNORMAL HIGH (ref 70–99)
Glucose-Capillary: 98 mg/dL (ref 70–99)

## 2018-04-15 LAB — PROTIME-INR
INR: 1.17
Prothrombin Time: 14.8 seconds (ref 11.4–15.2)

## 2018-04-15 LAB — MRSA PCR SCREENING: MRSA by PCR: NEGATIVE

## 2018-04-15 MED ORDER — SODIUM CHLORIDE 0.9 % IV BOLUS
1000.0000 mL | Freq: Once | INTRAVENOUS | Status: AC
  Administered 2018-04-15: 1000 mL via INTRAVENOUS

## 2018-04-15 MED ORDER — VANCOMYCIN HCL 10 G IV SOLR
1500.0000 mg | Freq: Once | INTRAVENOUS | Status: AC
  Administered 2018-04-15: 1500 mg via INTRAVENOUS
  Filled 2018-04-15: qty 1500

## 2018-04-15 MED ORDER — SODIUM CHLORIDE 0.9 % IV SOLN
500.0000 mg | INTRAVENOUS | Status: DC
  Administered 2018-04-15 – 2018-04-16 (×2): 500 mg via INTRAVENOUS
  Filled 2018-04-15 (×3): qty 500

## 2018-04-15 MED ORDER — ACETAMINOPHEN 650 MG RE SUPP
650.0000 mg | Freq: Four times a day (QID) | RECTAL | Status: DC | PRN
  Administered 2018-04-15: 650 mg via RECTAL
  Filled 2018-04-15: qty 1

## 2018-04-15 MED ORDER — SODIUM CHLORIDE 0.9 % IV SOLN: 2.0000 g | INTRAVENOUS | Status: DC

## 2018-04-15 MED ORDER — SODIUM CHLORIDE 0.9 % IV BOLUS (SEPSIS)
1000.0000 mL | Freq: Once | INTRAVENOUS | Status: AC
  Administered 2018-04-15: 1000 mL via INTRAVENOUS

## 2018-04-15 MED ORDER — SODIUM CHLORIDE 0.9 % IV SOLN
2.0000 g | Freq: Once | INTRAVENOUS | Status: AC
  Administered 2018-04-15: 2 g via INTRAVENOUS
  Filled 2018-04-15: qty 2

## 2018-04-15 MED ORDER — VANCOMYCIN HCL 10 G IV SOLR: 1750.0000 mg | INTRAVENOUS | Status: DC

## 2018-04-15 MED ORDER — ACETAMINOPHEN 325 MG PO TABS: 650.0000 mg | ORAL_TABLET | Freq: Four times a day (QID) | ORAL | Status: DC | PRN

## 2018-04-15 MED ORDER — METRONIDAZOLE IN NACL 5-0.79 MG/ML-% IV SOLN
500.0000 mg | Freq: Three times a day (TID) | INTRAVENOUS | Status: DC
  Administered 2018-04-15: 500 mg via INTRAVENOUS
  Filled 2018-04-15: qty 100

## 2018-04-15 MED ORDER — VANCOMYCIN HCL IN DEXTROSE 1-5 GM/200ML-% IV SOLN: 1000.0000 mg | Freq: Once | INTRAVENOUS | Status: DC

## 2018-04-15 MED ORDER — HEPARIN SODIUM (PORCINE) 5000 UNIT/ML IJ SOLN
5000.0000 [IU] | Freq: Three times a day (TID) | INTRAMUSCULAR | Status: DC
  Administered 2018-04-15 – 2018-04-17 (×6): 5000 [IU] via SUBCUTANEOUS
  Filled 2018-04-15 (×6): qty 1

## 2018-04-15 MED ORDER — SODIUM CHLORIDE 0.9 % IV SOLN
2.0000 g | INTRAVENOUS | Status: DC
  Administered 2018-04-15 – 2018-04-16 (×2): 2 g via INTRAVENOUS
  Filled 2018-04-15 (×3): qty 20

## 2018-04-15 MED ORDER — INSULIN ASPART 100 UNIT/ML ~~LOC~~ SOLN
0.0000 [IU] | SUBCUTANEOUS | Status: DC
  Administered 2018-04-15: 3 [IU] via SUBCUTANEOUS
  Administered 2018-04-15 – 2018-04-16 (×3): 2 [IU] via SUBCUTANEOUS

## 2018-04-15 NOTE — ED Provider Notes (Signed)
[facility] EMERGENCY DEPARTMENT Provider Note   CSN: [id number] Arrival date & time: 04/15/18  0033     History   Chief Complaint Chief Complaint  Patient presents with  . Fever  . Shortness of Breath    HPI [name] is a 85 y.o. adult.  The history is provided by the patient and the EMS personnel.  He has history of hypertension, diabetes, hyperlipidemia, chronic kidney disease, COPD and comes in because of dyspnea.  He was well until this morning when he started noticing chills.  He denies fever or sweats.  He has had a cough productive of yellowish to greenish sputum.  He has had progressive dyspnea through the day and has had general lethargy.  He has had urinary frequency without urgency or dysuria.  He has been incontinent because he has been too weak to get up to go to the bathroom.  He denies any sick contacts, travel to [place], exposure to people who have been to [place].  He has had influenza and pneumonia vaccinations this year.  EMS reports patient was severely dyspneic on arrival and hypoxic with oxygen saturation of 82% which improved when he was placed on CPAP.  They report temperature 101.2 in the ambulance and he did receive acetaminophen prior to arriving in the ED.  Past Medical History:  Diagnosis Date  . ANEMIA, IRON DEF, NOS 01/15/2007   Qualifier: Diagnosis of  By: Jeannine Kitten MD, Rodman Key    . Arthritis   . Cataract extraction status of left eye 2016  . Cataract extraction status of right eye 2016  . CHOLECYSTECTOMY, LAPAROSCOPIC, HX OF 04/12/2007   Qualifier: Diagnosis of  By: Netty Starring  MD, Lucianne Muss    . Diabetes mellitus without complication ([name])   . Gout 2007  . Hypertension     Patient Active Problem List   Diagnosis Date Noted  . CKD (chronic kidney disease) stage 3, GFR 30-59 ml/min (HCC) 01/04/2018  . Seasonal allergies 06/30/2017  . Osteoarthritis of both knees 11/16/2015  . Left hip pain 12/12/2014  . Hoarseness 08/13/2014    . Left foot pain 06/09/2014  . Pain of left heel 05/27/2014  . Dry skin dermatitis 04/16/2014  . Dyslipidemia 04/16/2014  . History of anemia 04/07/2014  . Uncontrolled type 2 diabetes mellitus with diabetic neuropathy, without long-term current use of insulin ([place]) 03/21/2014  . Healthcare maintenance 07/09/2013  . Sacral nerve root compression 06/07/2011  . Herniated nucleus pulposus, L4-5 12/24/2010  . Essential hypertension 05/07/2010  . Chronic obstructive pulmonary disease ([place]) 03/20/2007  . Gout 01/15/2007  . Asthma 01/15/2007    Past Surgical History:  Procedure Laterality Date  . CHOLECYSTECTOMY       OB History   No obstetric history on file.      Home Medications    Prior to Admission medications   Medication Sig Start Date End Date Taking? Authorizing Provider  acetaminophen (TYLENOL) 325 MG tablet Take 2 tablets (650 mg total) by mouth every 6 (six) hours as needed for moderate pain. 06/09/15   Patrecia Pour, MD  ADVAIR DISKUS 500-50 MCG/DOSE AEPB TAKE 1 PUFF BY MOUTH TWICE A DAY 04/12/18   Guadalupe Dawn, MD  albuterol (PROVENTIL HFA;VENTOLIN HFA) 108 (90 Base) MCG/ACT inhaler Inhale 2 puffs into the lungs every 6 (six) hours as needed for wheezing or shortness of breath. 03/08/17   Eloise Levels, MD  albuterol (PROVENTIL) (2.5 MG/3ML) 0.083% nebulizer solution Take 3 mLs (2.5 mg total) by nebulization  every 6 (six) hours as needed for wheezing or shortness of breath. 07/24/17   Rogue Bussing, MD  allopurinol (ZYLOPRIM) 300 MG tablet Take 1 tablet (300 mg total) by mouth daily. 10/26/17   Guadalupe Dawn, MD  cetirizine (ZYRTEC) 10 MG tablet TAKE 1 TABLET BY MOUTH ONCE DAILY Patient not taking: Reported on 01/01/2018 08/01/17   Guadalupe Dawn, MD  cycloSPORINE (RESTASIS) 0.05 % ophthalmic emulsion 1 drop 2 (two) times daily.    [provider]  dextromethorphan-guaiFENesin (MUCINEX DM) 30-600 MG 12hr tablet Take 1 tablet by mouth 2 (two) times  daily. Patient not taking: Reported on 01/01/2018 09/05/17   Guadalupe Dawn, MD  diclofenac sodium (VOLTAREN) 1 % GEL APPLY 2 GRAMS TO AFFECTED AREA 4 TIMES A DAY 04/09/18   Guadalupe Dawn, MD  gabapentin (NEURONTIN) 100 MG capsule TAKE 1 CAPSULE BY MOUTH THREE TIMES A DAY 01/26/18   Guadalupe Dawn, MD  glucose blood (ONE TOUCH ULTRA TEST) test strip Use as instructed.  Dispense:   Quantity sufficient for one month supply of once daily testing. Patient not taking: Reported on 11/10/2015 04/01/14   Zenia Resides, MD  Lancets [facility] ULTRASOFT) lancets Use as instructed.  Dispense Quantity Sufficient for one month supply of once daily testing. Patient not taking: Reported on 11/10/2015 04/01/14   Zenia Resides, MD  levocetirizine (XYZAL) 5 MG tablet TAKE 1 TABLET BY MOUTH EVERY DAY IN THE EVENING 12/19/17   Guadalupe Dawn, MD  Lifitegrast [facility] OP) Apply to eye.    [provider]  metFORMIN (GLUCOPHAGE) 500 MG tablet Take 1 tablet (500 mg total) by mouth 2 (two) times daily with a meal. 01/04/18   Hensel, Jamal Collin, MD  olopatadine (PATANOL) 0.1 % ophthalmic solution PLACE 1 DROP IN [facility] EYE TWICE A DAY 11/09/17   [provider]  Polyethyl Glycol-Propyl Glycol (SYSTANE OP) Apply to eye.    [provider]  tiotropium (SPIRIVA HANDIHALER) 18 MCG inhalation capsule PLACE 1 CAPSULE (18 MCG TOTAL) INTO INHALER AND INHALE DAILY. 10/20/17   Guadalupe Dawn, MD  triamcinolone ointment (KENALOG) 0.1 % Apply 1 application topically 2 (two) times daily. Patient not taking: Reported on 07/17/2017 08/23/16   McKeag, Marylynn Pearson, MD    Family History Family History  Problem Relation Age of Onset  . Asthma Mother   . Heart disease Brother   . Stroke Brother   . Diabetes Brother   . Cancer Brother        colon  . Arthritis Sister   . Arthritis Daughter   . Stroke Brother   . Heart disease Brother   . Alcohol abuse Sister   . Cirrhosis Sister   . Asthma Sister   . Diabetes Son    . Asthma Daughter     Social History Social History   Tobacco Use  . Smoking status: Former Smoker    Packs/day: 0.50    Years: 60.00    Pack years: 30.00    Types: Cigars, Cigarettes    Start date: 03/08/1949    Last attempt to quit: 06/08/2011    Years since quitting: 6.8  . Smokeless tobacco: Never Used  . Tobacco comment: no plans to start  Substance Use Topics  . Alcohol use: No  . Drug use: No     Allergies   Patient has no known allergies.   Review of Systems Review of Systems  All other systems reviewed and are negative.    Physical Exam Updated Vital Signs  BP 107/70   Pulse (!) 153   Resp (!) 39   SpO2 96%   Physical Exam Vitals signs and nursing note reviewed.    85 year old male on BiPAP, resting comfortably and in no acute distress. Vital signs are significant for rapid respiratory rate and heart rate. Oxygen saturation is 96%, which is normal. Head is normocephalic and atraumatic. PERRLA, EOMI. Oropharynx is clear. Neck is nontender and supple without adenopathy or JVD. Back is nontender and there is no CVA tenderness. Lungs are clear without rales, wheezes, or rhonchi. Chest is nontender. Heart is tachycardic without murmur. Abdomen is soft, flat, nontender without masses or hepatosplenomegaly and peristalsis is normoactive. Extremities have no cyanosis or edema, full range of motion is present. Skin is warm and dry without rash. Neurologic: Mental status is normal, cranial nerves are intact, there are no motor or sensory deficits.  ED Treatments / Results  Labs (all labs ordered are listed, but only abnormal results are displayed) Labs Reviewed  COMPREHENSIVE METABOLIC PANEL - Abnormal; Notable for the following components:      Result Value   CO2 16 (*)    Glucose, Bld 165 (*)    BUN 28 (*)    Creatinine, Ser 1.99 (*)    Albumin 3.4 (*)    GFR calc non Af Amer 30 (*)    GFR calc Af Amer 35 (*)    All other components within normal  limits  LACTIC ACID, PLASMA - Abnormal; Notable for the following components:   Lactic Acid, Venous 3.1 (*)    All other components within normal limits  CBC WITH DIFFERENTIAL/PLATELET - Abnormal; Notable for the following components:   WBC 24.7 (*)    MCV 76.7 (*)    Neutro Abs 21.6 (*)    Monocytes Absolute 1.7 (*)    Abs Immature Granulocytes 0.28 (*)    All other components within normal limits  CULTURE, BLOOD (ROUTINE X 2)  CULTURE, BLOOD (ROUTINE X 2)  PROTIME-INR  LACTIC ACID, PLASMA  URINALYSIS, ROUTINE W REFLEX MICROSCOPIC  BLOOD GAS, ARTERIAL    EKG EKG Interpretation  Date/Time:  Sunday April 15 2018 00:33:38 EST Ventricular Rate:  155 PR Interval:    QRS Duration: 97 QT Interval:  353 QTC Calculation: 567 R Axis:   -62 Text Interpretation:  Supraventricular tachycardia Left anterior fascicular block Low voltage, precordial leads Nonspecific T abnormalities, lateral leads When compared with ECG of 07/17/2017, HEART RATE has increased Confirmed by Delora Fuel ([id number]) on 04/15/2018 12:40:33 AM   Radiology Dg Chest Portable 1 View  Result Date: 04/15/2018 CLINICAL DATA:  Suspected sepsis, weakness, cough EXAM: PORTABLE CHEST 1 VIEW COMPARISON:  07/17/2017 chest radiograph. FINDINGS: Stable cardiomediastinal silhouette with normal heart size. No pneumothorax. No pleural effusion. Mild hazy left perihilar opacity. Mild bibasilar scarring. IMPRESSION: Mild hazy left perihilar opacity, which could represent a developing pneumonia. Recommend follow-up chest radiographs to resolution. Electronically Signed   By: Ilona Sorrel M.D.   On: 04/15/2018 01:15    Procedures Procedures  CRITICAL CARE Performed by: Delora Fuel Total critical care time: 60 minutes Critical care time was exclusive of separately billable procedures and treating other patients. Critical care was necessary to treat or prevent imminent or life-threatening deterioration. Critical care was time spent  personally by me on the following activities: development of treatment plan with patient and/or surrogate as well as nursing, discussions with consultants, evaluation of patient's response to treatment, examination of patient, obtaining history from patient  or surrogate, ordering and performing treatments and interventions, ordering and review of laboratory studies, ordering and review of radiographic studies, pulse oximetry and re-evaluation of patient's condition.  Medications Ordered in ED Medications  sodium chloride 0.9 % bolus 1,000 mL (has no administration in time range)    And  sodium chloride 0.9 % bolus 1,000 mL (has no administration in time range)    And  sodium chloride 0.9 % bolus 1,000 mL (has no administration in time range)  ceFEPIme (MAXIPIME) 2 g in sodium chloride 0.9 % 100 mL IVPB (has no administration in time range)  metroNIDAZOLE (FLAGYL) IVPB 500 mg (has no administration in time range)  vancomycin (VANCOCIN) 1,500 mg in sodium chloride 0.9 % 500 mL IVPB (has no administration in time range)     Initial Impression / Assessment and Plan / ED Course  I have reviewed the triage vital signs and the nursing notes.  Pertinent labs & imaging results that were available during my care of the patient were reviewed by me and considered in my medical decision making (see chart for details).  Sepsis with probable pneumonia.  Old records are reviewed, and he had been admitted to the hospital last May for community-acquired pneumonia.  Sepsis work-up was initiated.  Because of tachycardia, he has started on early, goal-directed fluid therapy.  He is empirically started on antibiotics for sepsis of undetermined origin.  ECG shows supraventricular tachycardia which is probably sinus tachycardia.  Heart rate has come down with fluids, but patient is actually become somewhat hypotensive with blood pressure in the 80s.  He will be given additional IV fluids.  Labs do show evidence of  mild acute kidney injury with creatinine up to 1.99 with baseline about 1.2-1.3.  Chest x-ray does appear to show small area of infiltrate on the left midlung field.  Initial lactate is elevated to 3.1, repeat pending.  Patient is reevaluated and he continues to be nontoxic in appearance in spite of ongoing tachycardia and borderline low blood pressure.  I do feel he is appropriate for stepdown rather than ICU care.  Case is discussed with Dr. Garlan Fillers of family practice service who agrees to admit the patient.  Final Clinical Impressions(s) / ED Diagnoses   Final diagnoses:  Community acquired pneumonia of left lung, unspecified part of lung  Sepsis without acute organ dysfunction, due to unspecified organism [facility])  Acute kidney injury (nontraumatic) [facility])    ED Discharge Orders    None       Delora Fuel, MD 60/67/70 [phone]

## 2018-04-15 NOTE — H&P (Signed)
[name], MRN:  [id number], DOB:  [birth year], LOS: 0 ADMISSION DATE:  04/15/2018, CONSULTATION DATE:  2/9 REFERRING MD:  Dr. Roxanne Mins EDP, CHIEF COMPLAINT:  SOB   Brief History   85 year old male admitted for severe sepsis secondary to pneumonia requiring BiPAP.   History of present illness   85 year old male with PMH as below, which is significant for COPD, DM, and CKD who presented to [facility] ED 2/9 with complaints of dyspnea and productive cough. He denied fever, chills, and sick contacts. Upon arrival to the ED he was hypoxemic with O2 sat 82% and temperature 101.2. He was also borderline hypotension. With concern for sepsis he was bolused 30cc/Kg IVF. His HR improved some, but his BP remained borderline. CXR concerning for PNA. He ultimately required BiPAP for dyspnea. PCCM asked to admit.   Past Medical History   has a past medical history of ANEMIA, IRON DEF, NOS (01/15/2007), Arthritis, Cataract extraction status of left eye (2016), Cataract extraction status of right eye (2016), CHOLECYSTECTOMY, LAPAROSCOPIC, HX OF (04/12/2007), Diabetes mellitus without complication ([place]), Gout (2007), and Hypertension.   Significant Hospital Events     Consults:    Procedures:    Significant Diagnostic Tests:    Micro Data:  Blood 2/9 > Sputum 2/9 > Urine 2/9 >  Antimicrobials:  Cefepime, Flagyl, Zosyn 2/9 in ED Ceftriaxone 2/9 > Vancomycin 2/9 >  Interim history/subjective:    Objective   Blood pressure 115/69, pulse (!) 117, temperature 99.8 F (37.7 C), temperature source Temporal, resp. rate (!) 25, height 5\' 11"  (1.803 m), weight 86.2 kg, SpO2 100 %.    FiO2 (%):  [60 %] 60 %  No intake or output data in the 24 hours ending 04/15/18 0453 Filed Weights   04/15/18 0043  Weight: 86.2 kg    Examination: General: elderly male on BiPAP HENT: [place]/AT, PERRL, No JVD Lungs: Clear bilateral breath sounds  Cardiovascular: RRR, no MRG Abdomen: Soft, non-tender,  non-distended Extremities: No acute deformity or edema.  Neuro: Sleeping, but easily arousable and answers orientation questions appropriately.   Resolved Hospital Problem list     Assessment & Plan:   Severe sepsis secondary to presumed pneumonia: Lactic has cleared. BP remains borderline but is improved. - Admit to ICU - BiPAP PRN, alternate with HFNC.  - Empiric ABX as above. - Follow cultures - Continued IVF, despite 4L IVF I suspect he remains dry.  - Trend PCT  COPD without acute exacerbation - Scheduled nebs - Defer steroids -Holding home advair, spiriva   DM - CBC monitoring and SSI - Hold metformin  If he is able to come off BiPAP will transfer out of ICU to [place] practice:  Diet: NPO Pain/Anxiety/Delirium protocol (if indicated): na VAP protocol (if indicated): na DVT prophylaxis: heparin GI prophylaxis:  Glucose control: SSI Mobility: BR Code Status: DNR Family Communication: Patient updated in ED>  Disposition: ICU  Labs   CBC: Recent Labs  Lab 04/15/18 0050 04/15/18 0258  WBC 24.7*  --   NEUTROABS 21.6*  --   HGB 14.2 11.6*  HCT 40.8 34.0*  MCV 76.7*  --   PLT 193  --     Basic Metabolic Panel: Recent Labs  Lab 04/15/18 0050 04/15/18 0258  NA 137 141  K 3.9 3.9  CL 107  --   CO2 16*  --   GLUCOSE 165*  --   BUN 28*  --   CREATININE 1.99*  --  CALCIUM 9.0  --    GFR: Estimated Creatinine Clearance (by C-G formula based on SCr of 1.99 mg/dL (H)) Male: 26.5 mL/min (A) Male: 30.5 mL/min (A) Recent Labs  Lab 04/15/18 0050 04/15/18 0246  WBC 24.7*  --   LATICACIDVEN 3.1* 0.9    Liver Function Tests: Recent Labs  Lab 04/15/18 0050  AST 37  ALT 30  ALKPHOS 69  BILITOT 1.0  PROT 6.5  ALBUMIN 3.4*   No results for input(s): LIPASE, AMYLASE in the last 168 hours. No results for input(s): AMMONIA in the last 168 hours.  ABG    Component Value Date/Time   PHART 7.321 (L) 04/15/2018 0258   PCO2ART 30.9 (L)  04/15/2018 0258   PO2ART 154.0 (H) 04/15/2018 0258   HCO3 15.8 (L) 04/15/2018 0258   TCO2 17 (L) 04/15/2018 0258   ACIDBASEDEF 9.0 (H) 04/15/2018 0258   O2SAT 99.0 04/15/2018 0258     Coagulation Profile: Recent Labs  Lab 04/15/18 0050  INR 1.17    Cardiac Enzymes: No results for input(s): CKTOTAL, CKMB, CKMBINDEX, TROPONINI in the last 168 hours.  HbA1C: HbA1c, POC (controlled diabetic range)  Date/Time Value Ref Range Status  01/04/2018 11:10 AM 7.5 (A) 0.0 - 7.0 % Final  09/05/2017 01:35 PM 7.4 (A) 0.0 - 7.0 % Final    CBG: No results for input(s): GLUCAP in the last 168 hours.  Review of Systems:   Bolds are positive  Constitutional: weight loss, gain, night sweats, Fevers, chills, fatigue .  HEENT: headaches, Sore throat, sneezing, nasal congestion, post nasal drip, Difficulty swallowing, Tooth/dental problems, visual complaints visual changes, ear ache CV:  chest pain, radiates:,Orthopnea, PND, swelling in lower extremities, dizziness, palpitations, syncope.  GI  heartburn, indigestion, abdominal pain, nausea, vomiting, diarrhea, change in bowel habits, loss of appetite, bloody stools.  Resp: cough, productive: , hemoptysis, dyspnea, chest pain, pleuritic.  Skin: rash or itching or icterus GU: dysuria, change in color of urine, urgency or frequency. flank pain, hematuria  MS: joint pain or swelling. decreased range of motion  Psych: change in mood or affect. depression or anxiety.  Neuro: difficulty with speech, weakness, numbness, ataxia    Past Medical History  He,  has a past medical history of ANEMIA, IRON DEF, NOS (01/15/2007), Arthritis, Cataract extraction status of left eye (2016), Cataract extraction status of right eye (2016), CHOLECYSTECTOMY, LAPAROSCOPIC, HX OF (04/12/2007), Diabetes mellitus without complication ([place]), Gout (2007), and Hypertension.   Surgical History    Past Surgical History:  Procedure Laterality Date  . CHOLECYSTECTOMY        Social History   reports that he quit smoking about 6 years ago. His smoking use included cigars and cigarettes. He started smoking about 67 years ago. He has a 30.00 pack-year smoking history. He has never used smokeless tobacco. He reports that he does not drink alcohol or use drugs.   Family History   His family history includes Alcohol abuse in his sister; Arthritis in his daughter and sister; Asthma in his daughter, mother, and sister; Cancer in his brother; Cirrhosis in his sister; Diabetes in his brother and son; Heart disease in his brother and brother; Stroke in his brother and brother.   Allergies No Known Allergies   Home Medications  Prior to Admission medications   Medication Sig Start Date End Date Taking? Authorizing Provider  acetaminophen (TYLENOL) 325 MG tablet Take 2 tablets (650 mg total) by mouth every 6 (six) hours as needed for moderate pain. 06/09/15  Patrecia Pour, MD  ADVAIR DISKUS 500-50 MCG/DOSE AEPB TAKE 1 PUFF BY MOUTH TWICE A DAY 04/12/18   Guadalupe Dawn, MD  albuterol (PROVENTIL HFA;VENTOLIN HFA) 108 (90 Base) MCG/ACT inhaler Inhale 2 puffs into the lungs every 6 (six) hours as needed for wheezing or shortness of breath. 03/08/17   Eloise Levels, MD  albuterol (PROVENTIL) (2.5 MG/3ML) 0.083% nebulizer solution Take 3 mLs (2.5 mg total) by nebulization every 6 (six) hours as needed for wheezing or shortness of breath. 07/24/17   Rogue Bussing, MD  allopurinol (ZYLOPRIM) 300 MG tablet Take 1 tablet (300 mg total) by mouth daily. 10/26/17   Guadalupe Dawn, MD  cetirizine (ZYRTEC) 10 MG tablet TAKE 1 TABLET BY MOUTH ONCE DAILY Patient not taking: Reported on 01/01/2018 08/01/17   Guadalupe Dawn, MD  cycloSPORINE (RESTASIS) 0.05 % ophthalmic emulsion 1 drop 2 (two) times daily.    [provider]  dextromethorphan-guaiFENesin (MUCINEX DM) 30-600 MG 12hr tablet Take 1 tablet by mouth 2 (two) times daily. Patient not taking: Reported on 01/01/2018  09/05/17   Guadalupe Dawn, MD  diclofenac sodium (VOLTAREN) 1 % GEL APPLY 2 GRAMS TO AFFECTED AREA 4 TIMES A DAY 04/09/18   Guadalupe Dawn, MD  gabapentin (NEURONTIN) 100 MG capsule TAKE 1 CAPSULE BY MOUTH THREE TIMES A DAY 01/26/18   Guadalupe Dawn, MD  glucose blood (ONE TOUCH ULTRA TEST) test strip Use as instructed.  Dispense:   Quantity sufficient for one month supply of once daily testing. Patient not taking: Reported on 11/10/2015 04/01/14   Zenia Resides, MD  Lancets [facility] ULTRASOFT) lancets Use as instructed.  Dispense Quantity Sufficient for one month supply of once daily testing. Patient not taking: Reported on 11/10/2015 04/01/14   Zenia Resides, MD  levocetirizine (XYZAL) 5 MG tablet TAKE 1 TABLET BY MOUTH EVERY DAY IN THE EVENING 12/19/17   Guadalupe Dawn, MD  Lifitegrast [facility] OP) Apply to eye.    [provider]  metFORMIN (GLUCOPHAGE) 500 MG tablet Take 1 tablet (500 mg total) by mouth 2 (two) times daily with a meal. 01/04/18   Hensel, Jamal Collin, MD  olopatadine (PATANOL) 0.1 % ophthalmic solution PLACE 1 DROP IN [facility] EYE TWICE A DAY 11/09/17   [provider]  Polyethyl Glycol-Propyl Glycol (SYSTANE OP) Apply to eye.    [provider]  tiotropium (SPIRIVA HANDIHALER) 18 MCG inhalation capsule PLACE 1 CAPSULE (18 MCG TOTAL) INTO INHALER AND INHALE DAILY. 10/20/17   Guadalupe Dawn, MD  triamcinolone ointment (KENALOG) 0.1 % Apply 1 application topically 2 (two) times daily. Patient not taking: Reported on 07/17/2017 08/23/16   McKeag, Marylynn Pearson, MD     Critical care time: 3 mins     Georgann Housekeeper, AGACNP-BC [place] Pager [phone] or [phone]  04/15/2018 6:01 AM

## 2018-04-15 NOTE — ED Triage Notes (Signed)
Pt brought in by EMS with c/o weakness and cough. Pt unable to leave bed all day due to lethargy. Pt febrile 101-103 F on scene with o2 saturation 82% on RA. Pt put on 4L with O2 sat up to 94%. Pt tachypneic and tachycardic in the 150's, placed on CPAP prior to arrival. Given 1000 mg tylenol in the field.

## 2018-04-15 NOTE — Progress Notes (Signed)
Pharmacy Antibiotic Note  [name] is a 85 y.o. adult admitted on 04/15/2018 with sepsis.  Pharmacy has been consulted for Vancomycin and Cefepime dosing. Also on Flagyl.  Plan: Cefepime 2gm IV q24h Vancomycin 1500mg  now then 1750 mg IV Q 48 hrs. Goal AUC 400-550. Expected AUC: 475 SCr used: 1.99 Will f/u renal function, micro data, and pt's clinical condition Vanc levels prn   Height: 5\' 11"  (180.3 cm) Weight: 190 lb (86.2 kg) IBW/kg (Calculated) : 75.3  Temp (24hrs), Avg:99.8 F (37.7 C), Min:99.8 F (37.7 C), Max:99.8 F (37.7 C)  No results for input(s): WBC, CREATININE, LATICACIDVEN, VANCOTROUGH, VANCOPEAK, VANCORANDOM, GENTTROUGH, GENTPEAK, GENTRANDOM, TOBRATROUGH, TOBRAPEAK, TOBRARND, AMIKACINPEAK, AMIKACINTROU, AMIKACIN in the last 168 hours.  CrCl cannot be calculated (Patient's most recent lab result is older than the maximum 21 days allowed.).    No Known Allergies  Antimicrobials this admission: 2/9 Vanc >>  2/9 Cefepime >>  2/9 Flagyl>>  Microbiology results: Pending  Thank you for allowing pharmacy to be a part of this patient's care.  Sherlon Handing, PharmD, BCPS Clinical pharmacist  **Pharmacist phone directory can now be found on [email or web address] ([email or web address]).  Listed under [place]. 04/15/2018 12:51 AM

## 2018-04-15 NOTE — Progress Notes (Signed)
RT note: RT removed patient from BIPAP and placed on 4L nasal canula. Patients vital sign stable at this time. RN and MD aware. Will continue to monitor.

## 2018-04-15 NOTE — Progress Notes (Signed)
Pt transferred to 34M ICU via bipap w/ no apparent complications.  Unit RT given report.

## 2018-04-15 NOTE — Progress Notes (Signed)
FPTS Interim Progress Note  S: Mr [name] is an 85y/o male with PMH of COPD, CKD, DM, HTN, HLD who presented to the ED with dyspnea that began this morning and meeting sepsis criteria thought secondary to PNA seen on CXR. He was given 3L of fluid and started on broad spectrum antibiotics and BiPap when the FM team was consulted for admission. Given his continued hypotension I consulted CCM and spoke to them who thought he would need to go on presssors. Given likely need for pressors will defer to CCM for admission and gladly take over care of patient when he is stable and not needing pressors.  O: BP (!) 88/61   Pulse (!) 133   Temp 99.8 F (37.7 C) (Temporal)   Resp (!) 24   Ht 5\' 11"  (1.803 m)   Wt 86.2 kg   SpO2 96%   BMI 26.50 kg/m   Gen: Alert and Oriented x 3, NAD HEENT: Normocephalic, atraumatic, PERRLA, EOMI Neck: trachea midline, no thyroidmegaly, no LAD CV: Tachycardia, reg rhythm, faint heart sounds, no murmurs, normal S1, S2 split Resp: On Bipap, no wheezing, rales, or rhonchi Abd: non-distended, mild tenderness in lower quadrants, soft, +bs in all four quadrants MSK: Moves all four extremities Ext: no clubbing, cyanosis, or edema Neuro: CN II-XII intact, no gross deficits, gross sensation intact, 4/5 UE and LE strength Skin: warm, dry, intact, no rashes  A/P: Agree with aggressive fluid resuscitation, broad spectrum antibiotics and Bipap per sepsis protocol. Admission per CCM for hypotension requiring pressors  Nuala Alpha, DO 04/15/2018, 2:58 AM PGY-2, [place] [facility] pager [phone]

## 2018-04-16 ENCOUNTER — Inpatient Hospital Stay (HOSPITAL_COMMUNITY): Payer: PPO

## 2018-04-16 DIAGNOSIS — J9601 Acute respiratory failure with hypoxia: Secondary | ICD-10-CM

## 2018-04-16 LAB — EXPECTORATED SPUTUM ASSESSMENT W GRAM STAIN, RFLX TO RESP C

## 2018-04-16 LAB — BASIC METABOLIC PANEL
Anion gap: 14 (ref 5–15)
BUN: 27 mg/dL — ABNORMAL HIGH (ref 8–23)
CO2: 15 mmol/L — ABNORMAL LOW (ref 22–32)
Calcium: 8.2 mg/dL — ABNORMAL LOW (ref 8.9–10.3)
Chloride: 110 mmol/L (ref 98–111)
Creatinine, Ser: 1.6 mg/dL — ABNORMAL HIGH (ref 0.61–1.24)
GFR calc Af Amer: 46 mL/min — ABNORMAL LOW (ref >60)
GFR calc non Af Amer: 40 mL/min — ABNORMAL LOW (ref >60)
Glucose, Bld: 139 mg/dL — ABNORMAL HIGH (ref 70–99)
Potassium: 3.4 mmol/L — ABNORMAL LOW (ref 3.5–5.1)
Sodium: 139 mmol/L (ref 135–145)

## 2018-04-16 LAB — GLUCOSE, CAPILLARY
Glucose-Capillary: 118 mg/dL — ABNORMAL HIGH (ref 70–99)
Glucose-Capillary: 132 mg/dL — ABNORMAL HIGH (ref 70–99)
Glucose-Capillary: 123 mg/dL — ABNORMAL HIGH (ref 70–99)
Glucose-Capillary: 114 mg/dL — ABNORMAL HIGH (ref 70–99)
Glucose-Capillary: 190 mg/dL — ABNORMAL HIGH (ref 70–99)
Glucose-Capillary: 155 mg/dL — ABNORMAL HIGH (ref 70–99)

## 2018-04-16 LAB — CBC
HCT: 36.6 % — ABNORMAL LOW (ref 39.0–52.0)
Hemoglobin: 12.4 g/dL — ABNORMAL LOW (ref 13.0–17.0)
MCH: 25.9 pg — ABNORMAL LOW (ref 26.0–34.0)
MCHC: 33.9 g/dL (ref 30.0–36.0)
MCV: 76.4 fL — ABNORMAL LOW (ref 80.0–100.0)
Platelets: 172 10*3/uL (ref 150–400)
RBC: 4.79 MIL/uL (ref 4.22–5.81)
RDW: 14.9 % (ref 11.5–15.5)
WBC: 25.6 10*3/uL — ABNORMAL HIGH (ref 4.0–10.5)
nRBC: 0 % (ref 0.0–0.2)

## 2018-04-16 LAB — C DIFFICILE QUICK SCREEN W PCR REFLEX
C Diff antigen: NEGATIVE
C Diff interpretation: NOT DETECTED
C Diff toxin: NEGATIVE

## 2018-04-16 LAB — MAGNESIUM: Magnesium: 1.1 mg/dL — ABNORMAL LOW (ref 1.7–2.4)

## 2018-04-16 LAB — PHOSPHORUS: Phosphorus: 2.4 mg/dL — ABNORMAL LOW (ref 2.5–4.6)

## 2018-04-16 MED ORDER — LACTATED RINGERS IV SOLN
INTRAVENOUS | Status: DC
  Administered 2018-04-16 – 2018-04-17 (×2): via INTRAVENOUS

## 2018-04-16 MED ORDER — SODIUM CHLORIDE 0.9 % IV SOLN
INTRAVENOUS | Status: DC | PRN
  Administered 2018-04-16: 250 mL via INTRAVENOUS

## 2018-04-16 MED ORDER — POTASSIUM CHLORIDE CRYS ER 20 MEQ PO TBCR
40.0000 meq | EXTENDED_RELEASE_TABLET | Freq: Once | ORAL | Status: AC
  Administered 2018-04-16: 40 meq via ORAL
  Filled 2018-04-16: qty 2

## 2018-04-16 MED ORDER — POTASSIUM PHOSPHATES 15 MMOLE/5ML IV SOLN
20.0000 mmol | Freq: Once | INTRAVENOUS | Status: AC
  Administered 2018-04-16: 20 mmol via INTRAVENOUS
  Filled 2018-04-16: qty 6.67

## 2018-04-16 MED ORDER — BUDESONIDE 0.5 MG/2ML IN SUSP
0.5000 mg | Freq: Two times a day (BID) | RESPIRATORY_TRACT | Status: DC
  Administered 2018-04-16 – 2018-04-17 (×2): 0.5 mg via RESPIRATORY_TRACT
  Filled 2018-04-16 (×2): qty 2

## 2018-04-16 MED ORDER — MAGNESIUM SULFATE 4 GM/100ML IV SOLN
4.0000 g | Freq: Once | INTRAVENOUS | Status: AC
  Administered 2018-04-16: 4 g via INTRAVENOUS
  Filled 2018-04-16: qty 100

## 2018-04-16 MED ORDER — IPRATROPIUM-ALBUTEROL 0.5-2.5 (3) MG/3ML IN SOLN
3.0000 mL | Freq: Four times a day (QID) | RESPIRATORY_TRACT | Status: DC
  Administered 2018-04-16: 3 mL via RESPIRATORY_TRACT
  Filled 2018-04-16: qty 3

## 2018-04-16 MED ORDER — INSULIN ASPART 100 UNIT/ML ~~LOC~~ SOLN
0.0000 [IU] | Freq: Three times a day (TID) | SUBCUTANEOUS | Status: DC
  Administered 2018-04-16 (×2): 3 [IU] via SUBCUTANEOUS
  Administered 2018-04-17 – 2018-04-18 (×5): 2 [IU] via SUBCUTANEOUS

## 2018-04-16 NOTE — Progress Notes (Signed)
eLink Physician-Brief Progress Note Patient Name: [name] DOB: 1933/10/28 MRN: [id number]   Date of Service  04/16/2018  HPI/Events of Note  Watery diarrhea X 3 - Not on laxatives or stool softeners.   eICU Interventions  Will order: 1. Stool for C.Difficile PCR panel. 2. Enteric precautions.      Intervention Category Major Interventions: Other:  Lysle Dingwall 04/16/2018, 6:37 AM

## 2018-04-16 NOTE — Progress Notes (Addendum)
NAMEEmmit [name], MRN:  [id number], DOB:  1933/12/15, LOS: 1 ADMISSION DATE:  04/15/2018, CONSULTATION DATE:  2/9 REFERRING MD:  Dr. Roxanne Mins EDP, CHIEF COMPLAINT:  SOB   Brief History   85 year old male admitted for severe sepsis secondary to pneumonia requiring BiPAP.   History of present illness   85 year old male with PMH as below, which is significant for COPD, DM, and CKD who presented to [facility] ED 2/9 with complaints of dyspnea and productive cough. He denied fever, chills, and sick contacts. Upon arrival to the ED he was hypoxemic with O2 sat 82% and temperature 101.2. He was also borderline hypotension. With concern for sepsis he was bolused 30cc/Kg IVF. His HR improved some, but his BP remained borderline. CXR concerning for PNA. He ultimately required BiPAP for dyspnea. PCCM asked to admit.   Past Medical History   has a past medical history of ANEMIA, IRON DEF, NOS (01/15/2007), Arthritis, Cataract extraction status of left eye (2016), Cataract extraction status of right eye (2016), CHOLECYSTECTOMY, LAPAROSCOPIC, HX OF (04/12/2007), Diabetes mellitus without complication ([place]), Gout (2007), and Hypertension.   Significant Hospital Events     Consults:    Procedures:    Significant Diagnostic Tests:    Micro Data:  Blood 2/9 > Sputum 2/9 > Urine 2/9 >  Antimicrobials:  Cefepime, Flagyl, Zosyn 2/9 in ED Ceftriaxone 2/9 > Azithromycin 2/9 >  Interim history/subjective:  Awake alert no distress Objective   Blood pressure 135/89, pulse (Abnormal) 111, temperature 98.7 F (37.1 C), temperature source Oral, resp. rate (Abnormal) 25, height 5\' 11"  (1.803 m), weight 82 kg, SpO2 96 %.        Intake/Output Summary (Last 24 hours) at 04/16/2018 1404 Last data filed at 04/16/2018 1218 Gross per 24 hour  Intake 450 ml  Output 1030 ml  Net -580 ml   Filed Weights   04/15/18 0043 04/16/18 0500  Weight: 86.2 kg 82 kg    Examination General: Is a pleasant  85 year old male he is in no acute distress HEENT normocephalic atraumatic Pulmonary: Diminished throughout no accessory use Cardiac: Regular rate and rhythm Abdomen: Soft nontender GU: Clear yellow  Neuro: Intact Extremities: Brisk cap refill no edema  Resolved Hospital Problem list     Assessment & Plan:   Acute hypoxic and HCRF 2/2 PNA super imposed on COPD  Portable chest x-ray personally reviewed:This shows slight progression of left sided airspace disease -Respiratory viral was negative Plan Continue supplemental oxygen Scheduled bronchodilators BiPAP PRN Pulmonary hygiene Mobilize Pulse oximetry  Severe sepsis  pneumonia: Lactic has cleared. BP remains borderline but is improved. Plan Send urine strep and Legionella antigen Day #2 ceftriaxone and azithromycin Trend CBC  Fluid and electrolyte balance: Hypokalemia, hypomagnesemia, non-anion gap metabolic acidosis & AKI Plan Replace potassium and Mg  Change maintenance IV fluids to LR at 50  A.m. chemistry  DM Plan Sliding scale insulin  Anemia without evidence of bleeding Plan Trend CBC  If he is able to come off BiPAP will transfer out of ICU to [place] practice:  Diet: NPO Pain/Anxiety/Delirium protocol (if indicated): na VAP protocol (if indicated): na DVT prophylaxis: heparin GI prophylaxis:  Glucose control: SSI Mobility: BR Code Status: DNR Family Communication: Patient updated in ED>  Disposition: Discontinue telemetry move out of the intensive care family practice to assume his care  Erick Colace ACNP-BC [place] Pager # [phone] OR # [phone] if no answer    04/16/2018  2:04 PM

## 2018-04-16 NOTE — Progress Notes (Signed)
Report called to 3W.

## 2018-04-16 NOTE — Telephone Encounter (Signed)
Patient appears to be admitted to the ICU in the hospital. Will change medication if inpatient teams continue advair on discharge. Will continue to follow.  Guadalupe Dawn MD PGY-2 Family Medicine Resident

## 2018-04-16 NOTE — Progress Notes (Signed)
Pt transfered from 29M, alert and oriented, denies any pain at this time, settled in bed with call light within pt's reach, safety concern addressed accordingly, was however reassured and will continue to monitor. Obasogie-Asidi, Beatrice Ziehm Efe

## 2018-04-17 DIAGNOSIS — J42 Unspecified chronic bronchitis: Secondary | ICD-10-CM

## 2018-04-17 DIAGNOSIS — D72829 Elevated white blood cell count, unspecified: Secondary | ICD-10-CM

## 2018-04-17 LAB — BASIC METABOLIC PANEL
Anion gap: 11 (ref 5–15)
BUN: 23 mg/dL (ref 8–23)
CO2: 15 mmol/L — ABNORMAL LOW (ref 22–32)
Calcium: 8.8 mg/dL — ABNORMAL LOW (ref 8.9–10.3)
Chloride: 113 mmol/L — ABNORMAL HIGH (ref 98–111)
Creatinine, Ser: 1.46 mg/dL — ABNORMAL HIGH (ref 0.61–1.24)
GFR calc Af Amer: 51 mL/min — ABNORMAL LOW (ref >60)
GFR calc non Af Amer: 44 mL/min — ABNORMAL LOW (ref >60)
Glucose, Bld: 148 mg/dL — ABNORMAL HIGH (ref 70–99)
Potassium: 3.6 mmol/L (ref 3.5–5.1)
Sodium: 139 mmol/L (ref 135–145)

## 2018-04-17 LAB — GLUCOSE, CAPILLARY
Glucose-Capillary: 140 mg/dL — ABNORMAL HIGH (ref 70–99)
Glucose-Capillary: 138 mg/dL — ABNORMAL HIGH (ref 70–99)
Glucose-Capillary: 115 mg/dL — ABNORMAL HIGH (ref 70–99)
Glucose-Capillary: 150 mg/dL — ABNORMAL HIGH (ref 70–99)

## 2018-04-17 LAB — CBC
HCT: 37.2 % — ABNORMAL LOW (ref 39.0–52.0)
Hemoglobin: 13.1 g/dL (ref 13.0–17.0)
MCH: 26.6 pg (ref 26.0–34.0)
MCHC: 35.2 g/dL (ref 30.0–36.0)
MCV: 75.6 fL — ABNORMAL LOW (ref 80.0–100.0)
Platelets: 231 10*3/uL (ref 150–400)
RBC: 4.92 MIL/uL (ref 4.22–5.81)
RDW: 14.9 % (ref 11.5–15.5)
WBC: 24.8 10*3/uL — ABNORMAL HIGH (ref 4.0–10.5)
nRBC: 0 % (ref 0.0–0.2)

## 2018-04-17 LAB — MAGNESIUM: Magnesium: 1.9 mg/dL (ref 1.7–2.4)

## 2018-04-17 MED ORDER — TIOTROPIUM BROMIDE MONOHYDRATE 18 MCG IN CAPS: 18.0000 ug | ORAL_CAPSULE | Freq: Every day | RESPIRATORY_TRACT | Status: DC

## 2018-04-17 MED ORDER — SODIUM CHLORIDE 0.9 % IV SOLN
500.0000 mg | INTRAVENOUS | Status: DC
  Administered 2018-04-17: 500 mg via INTRAVENOUS
  Filled 2018-04-17: qty 500

## 2018-04-17 MED ORDER — SODIUM CHLORIDE 0.9 % IV SOLN: 2.0000 g | INTRAVENOUS | Status: DC

## 2018-04-17 MED ORDER — UMECLIDINIUM BROMIDE 62.5 MCG/INH IN AEPB
1.0000 | INHALATION_SPRAY | Freq: Every day | RESPIRATORY_TRACT | Status: DC
  Administered 2018-04-18: 1 via RESPIRATORY_TRACT
  Filled 2018-04-17: qty 7

## 2018-04-17 MED ORDER — ENOXAPARIN SODIUM 40 MG/0.4ML ~~LOC~~ SOLN
40.0000 mg | SUBCUTANEOUS | Status: DC
  Administered 2018-04-17 – 2018-04-18 (×2): 40 mg via SUBCUTANEOUS
  Filled 2018-04-17 (×2): qty 0.4

## 2018-04-17 MED ORDER — SODIUM CHLORIDE 0.9 % IV SOLN: 500.0000 mg | INTRAVENOUS | Status: DC

## 2018-04-17 MED ORDER — BACID PO TABS
2.0000 | ORAL_TABLET | Freq: Three times a day (TID) | ORAL | Status: DC
  Administered 2018-04-17 – 2018-04-18 (×4): 2 via ORAL
  Filled 2018-04-17 (×5): qty 2

## 2018-04-17 MED ORDER — IPRATROPIUM-ALBUTEROL 0.5-2.5 (3) MG/3ML IN SOLN
3.0000 mL | Freq: Three times a day (TID) | RESPIRATORY_TRACT | Status: DC
  Administered 2018-04-17: 3 mL via RESPIRATORY_TRACT
  Filled 2018-04-17: qty 3

## 2018-04-17 MED ORDER — ALLOPURINOL 100 MG PO TABS
300.0000 mg | ORAL_TABLET | Freq: Every day | ORAL | Status: DC
  Administered 2018-04-17 – 2018-04-18 (×2): 300 mg via ORAL
  Filled 2018-04-17 (×2): qty 3

## 2018-04-17 MED ORDER — LOPERAMIDE HCL 2 MG PO CAPS
4.0000 mg | ORAL_CAPSULE | Freq: Once | ORAL | Status: AC
  Administered 2018-04-17: 4 mg via ORAL
  Filled 2018-04-17: qty 2

## 2018-04-17 MED ORDER — SODIUM CHLORIDE 0.9 % IV SOLN
2.0000 g | INTRAVENOUS | Status: DC
  Administered 2018-04-17: 2 g via INTRAVENOUS
  Filled 2018-04-17: qty 20

## 2018-04-17 MED ORDER — ALBUTEROL SULFATE (2.5 MG/3ML) 0.083% IN NEBU: 2.5000 mg | INHALATION_SOLUTION | RESPIRATORY_TRACT | Status: DC | PRN

## 2018-04-17 MED ORDER — CYCLOSPORINE 0.05 % OP EMUL
1.0000 [drp] | Freq: Two times a day (BID) | OPHTHALMIC | Status: DC
  Administered 2018-04-17 – 2018-04-18 (×2): 1 [drp] via OPHTHALMIC
  Filled 2018-04-17 (×4): qty 30

## 2018-04-17 MED ORDER — LOPERAMIDE HCL 2 MG PO CAPS: 2.0000 mg | ORAL_CAPSULE | ORAL | Status: DC | PRN

## 2018-04-17 MED ORDER — MOMETASONE FURO-FORMOTEROL FUM 200-5 MCG/ACT IN AERO
2.0000 | INHALATION_SPRAY | Freq: Two times a day (BID) | RESPIRATORY_TRACT | Status: DC
  Administered 2018-04-17 – 2018-04-18 (×2): 2 via RESPIRATORY_TRACT
  Filled 2018-04-17: qty 8.8

## 2018-04-17 NOTE — Care Management Note (Signed)
Case Management Note  Patient Details  Name: [name] MRN: [id number] Date of Birth: [birth year]  Subjective/Objective:    Pt admitted with sepsis. He is from home with spouse, daughter, granddaughter.  DME: walker--not currently using. Non issues with medications at home.  Pt drives self but daughter can drive if needed.                Action/Plan: CM following for d/c needs, physician orders.  Expected Discharge Date:                  Expected Discharge Plan:     In-House Referral:     Discharge planning Services     Post Acute Care Choice:    Choice offered to:     DME Arranged:    DME Agency:     HH Arranged:    HH Agency:     Status of Service:  In process, will continue to follow  If discussed at Long Length of Stay Meetings, dates discussed:    Additional Comments:  Pollie Friar, RN 04/17/2018, 1:12 PM

## 2018-04-17 NOTE — Discharge Summary (Signed)
[facility] Discharge Summary  Patient name: [name] Medical record number: [id number] Date of birth: 1933/03/28 Age: 85 y.o. Gender: adult Date of Admission: 04/15/2018  Date of Discharge: 04/18/2018.  Admitting Physician: Kandice Hams, MD  Primary Care Provider: Guadalupe Dawn, MD Consultants: none  Indication for Hospitalization: Pneumonia  Discharge Diagnoses/Problem List:  Pneumonia COPD Tachycardia Anemia Diarrhea Diabetes Mellitus - II Gout  Disposition: home  Discharge Condition: improved, stable  Discharge Exam:  BP 129/84   Pulse (!) 106   Temp 98.2 F (36.8 C) (Oral)   Resp 18   Ht 5\' 11"  (1.803 m)   Wt 82 kg   SpO2 96%   BMI 25.21 kg/m  Gen: NAD, alert, non-toxic, well-nourished, well-appearing, sitting comfortably  CV: distant heart sounds. Regular rate and rhythm.  Radial pulses 2+ bilaterally. No bilateral lower extremity edema. Resp: mildly decreased breath sounds.  Minimal wheezing heard No increased work of breathing appreciated. On 0.5L O2.  Abd: Nontender and nondistended on palpation to all 4 quadrants.  Positive bowel sounds. Psych: Cooperative with exam. Pleasant. Makes eye contact. Extremities: Full ROM   Brief Hospital Course:  Patient presented to the ED with dyspnea and meeting sepsis criteria.  CXR showed possible pneumonia.  Patient had hypotension that was refractory to 3L of IVF and requiring bipap.  Patient was admitted to ICU for these reasons.  His hypotension improved s/p 4th L of IVF. Broad spectrum antibiotics were also started.  He transitioned off of bipap on to nasal cannula and then to room air.  He was transferred from the ICU to the family medicine service.  His Abx were narrowed from CTX and azithromycin to oral cefdinir and oral azithromycin.  His home COPD medications were restarted in addition to getting duonebs.  duonebs were eventually transitioned to albuterol nebulizer PRN. On   His last day of admission patient was insistent on leaving.  Ambulatory pulse ox showed desaturation on room air while ambulating.  Order for home o2 and home health PT was given.  He was sent home with close followup in two days at the [facility] clinic.   Diarrhea - patient developed watery diarrhea for 3 days that resolved after imodium and probiotics were given. He was C. Diff negative.   Issues for Follow Up:  1. Followup PA and lateral chest X-ray is recommended in 3-4 weeks following trial of antibiotic therapy to ensure resolution and exclude underlying malignancy in this patient with COPD. 2. Electrolytes - pt was hypomagnesemic and hypokalemic during admission.  repleted both.  Consider recheck to see if levels are still wnl.  3. TSH- consider checking given patient's consistent tachycardia.  4. ABX - patient was sent home with cefdinir for 10 days total and 5 total days of azithromycin 5. Discuss with patient about using his albuterol nebulizer daily as this may be contributing to his tachcyardia and can lead to decreased response to rescue inhaler.   6. See if patient has received his home oxygen and if he is still requiring it. It was for ambulation only.    Significant Procedures: none  Significant Labs and Imaging:  Recent Labs  Lab 04/16/18 0600 04/17/18 1150 04/18/18 0500  WBC 25.6* 24.8* 18.8*  HGB 12.4* 13.1 12.4*  HCT 36.6* 37.2* 35.3*  PLT 172 231 211   Recent Labs  Lab 04/15/18 0050 04/15/18 0258 04/16/18 0600 04/17/18 0456 04/18/18 0500  NA 137 141 139 139 137  K 3.9 3.9 3.4* 3.6  3.3*  CL 107  --  110 113* 111  CO2 16*  --  15* 15* 15*  GLUCOSE 165*  --  139* 148* 143*  BUN 28*  --  27* 23 27*  CREATININE 1.99*  --  1.60* 1.46* 1.46*  CALCIUM 9.0  --  8.2* 8.8* 8.5*  MG  --   --  1.1* 1.9  --   PHOS  --   --  2.4*  --   --   ALKPHOS 69  --   --   --   --   AST 37  --   --   --   --   ALT 30  --   --   --   --   ALBUMIN 3.4*  --   --   --   --        Results/Tests Pending at Time of Discharge: none  Discharge Medications:  Allergies as of 04/18/2018   No Known Allergies     Medication List    TAKE these medications   albuterol 108 (90 Base) MCG/ACT inhaler Commonly known as:  PROVENTIL HFA;VENTOLIN HFA Inhale 2 puffs into the lungs every 6 (six) hours as needed for wheezing or shortness of breath.   albuterol (2.5 MG/3ML) 0.083% nebulizer solution Commonly known as:  PROVENTIL Take 3 mLs (2.5 mg total) by nebulization every 6 (six) hours as needed for wheezing or shortness of breath.   allopurinol 300 MG tablet Commonly known as:  ZYLOPRIM Take 1 tablet (300 mg total) by mouth daily.   cefdinir 300 MG capsule Commonly known as:  OMNICEF Take 1 capsule (300 mg total) by mouth every 12 (twelve) hours for 11 doses.   cycloSPORINE 0.05 % ophthalmic emulsion Commonly known as:  RESTASIS Place 1 drop into both eyes 2 (two) times daily.   diclofenac sodium 1 % Gel Commonly known as:  VOLTAREN APPLY 2 GRAMS TO AFFECTED AREA 4 TIMES A DAY What changed:  See the new instructions.   gabapentin 100 MG capsule Commonly known as:  NEURONTIN TAKE 1 CAPSULE BY MOUTH THREE TIMES A DAY What changed:  See the new instructions.   glucose blood test strip Commonly known as:  ONE TOUCH ULTRA TEST Use as instructed.  Dispense:   Quantity sufficient for one month supply of once daily testing.   metFORMIN 500 MG tablet Commonly known as:  GLUCOPHAGE Take 1 tablet (500 mg total) by mouth 2 (two) times daily with a meal.   onetouch ultrasoft lancets Use as instructed.  Dispense Quantity Sufficient for one month supply of once daily testing.   tiotropium 18 MCG inhalation capsule Commonly known as:  SPIRIVA HANDIHALER PLACE 1 CAPSULE (18 MCG TOTAL) INTO INHALER AND INHALE DAILY. What changed:    how much to take  how to take this  when to take this  additional instructions     ASK your doctor about these medications    azithromycin 250 MG tablet Commonly known as:  ZITHROMAX Take one pill daily on 2/12 and 2/13. Ask about: Should I take this medication?            Durable Medical Equipment  (From admission, onward)         Start     Ordered   04/18/18 0000  For home use only DME oxygen    Question Answer Comment  Mode or (Route) Nasal cannula   Liters per Minute 2   Frequency Continuous (stationary and portable oxygen  unit needed)   Oxygen delivery system Gas      04/18/18 1500          Discharge Instructions: Please refer to Patient Instructions section of EMR for full details.  Patient was counseled important signs and symptoms that should prompt return to medical care, changes in medications, dietary instructions, activity restrictions, and follow up appointments.   Follow-Up Appointments: Follow-up Information    [facility] Follow up.   Why:  They will contact you for the first visit. Contact information: [phone]          Benay Pike, MD 04/20/2018, 7:56 AM PGY-1, [place]

## 2018-04-17 NOTE — Evaluation (Signed)
Occupational Therapy Evaluation Patient Details Name: [name] MRN: [id number] DOB: 1933-04-15 Today's Date: 04/17/2018    History of Present Illness [name] is a 85 year old male past medical history of NIDDM, gout, hypertension, iron deficiency anemia, COPD, CKD presents on 2/9 to [facility] with complaints of dyspnea and productive cough, temperature 101.2 F, O2 sat 82% on room air and borderline hypotension.    Clinical Impression   Pt presents to OT with deficits in safety awareness and dynamic balance, making him a high fall risk. Pt currently requiring min A for LB dressing, min guard for functional mobility in room. Pt found OOB in bathroom upon entry, RN aware. Pt's tachycardic throughout session despite rest breaks. OT will follow acutely. Recommend HHOT at d/c.     Follow Up Recommendations  Home health OT;Supervision/Assistance - 24 hour    Equipment Recommendations  None recommended by OT    Recommendations for Other Services PT consult;Speech consult     Precautions / Restrictions Precautions Precautions: Fall Restrictions Weight Bearing Restrictions: No      Mobility Bed Mobility Overal bed mobility: Modified Independent             General bed mobility comments: using bed rails and bed features  Transfers Overall transfer level: Needs assistance   Transfers: Sit to/from Stand;Stand Pivot Transfers Sit to Stand: Min guard Stand pivot transfers: Min guard            Balance Overall balance assessment: Mild deficits observed, not formally tested                                         ADL either performed or assessed with clinical judgement   ADL Overall ADL's : Needs assistance/impaired Eating/Feeding: Supervision/ safety;Sitting   Grooming: Wash/dry hands;Oral care;Wash/dry face;Min guard;Standing   Upper Body Bathing: Supervision/ safety;Sitting   Lower Body Bathing: Sit to/from stand;Minimal assistance   Upper Body Dressing : Min guard;Sitting   Lower Body Dressing: Minimal assistance;Sit to/from stand   Toilet Transfer: Ambulation;Min guard   Toileting- Water quality scientist and Hygiene: Min guard;Sit to/from stand       Functional mobility during ADLs: Min guard General ADL Comments: Pt required min A to don socks, min guard during toileting tasks     Vision Baseline Vision/History: Wears glasses Wears Glasses: Reading only Patient Visual Report: No change from baseline Vision Assessment?: No apparent visual deficits            Pertinent Vitals/Pain Pain Assessment: No/denies pain     Hand Dominance Right   Extremity/Trunk Assessment Upper Extremity Assessment Upper Extremity Assessment: Generalized weakness   Lower Extremity Assessment Lower Extremity Assessment: Defer to PT evaluation   Cervical / Trunk Assessment Cervical / Trunk Assessment: Normal   Communication Communication Communication: HOH   Cognition Arousal/Alertness: Awake/alert Behavior During Therapy: Impulsive Overall Cognitive Status: No family/caregiver present to determine baseline cognitive functioning Area of Impairment: Orientation;Safety/judgement;Awareness                 Orientation Level: Situation       Safety/Judgement: Decreased awareness of safety;Decreased awareness of deficits Awareness: Intellectual   General Comments: Pt found in bathroom by himself upon entry to room, he had disconnected all lines including IV- RN alerted and bed alarm set upon exit   General Comments  Pt's HR tachy- 120-130 throughout session, O2 never came above 90% on 2L [place]  despite cueing for breathing/rest breaks. RN aware            Home Living Family/patient expects to be discharged to:: Private residence Living Arrangements: Spouse/significant other Available Help at Discharge: Family;Available 24 hours/day Type of Home: House Home Access: Stairs to enter [employer] of  Steps: 5-6 Entrance Stairs-Rails: Right;Left Home Layout: Two level Alternate Level Stairs-Number of Steps: 15 Alternate Level Stairs-Rails: Left Bathroom Shower/Tub: Teacher, early years/pre: Standard Bathroom Accessibility: Yes   Home Equipment: Walker - 2 wheels;Shower seat   Additional Comments: Per chart review      Prior Functioning/Environment Level of Independence: Independent                 OT Problem List: Decreased cognition;Decreased safety awareness;Decreased knowledge of precautions;Decreased knowledge of use of DME or AE;Cardiopulmonary status limiting activity;Impaired balance (sitting and/or standing);Decreased activity tolerance      OT Treatment/Interventions: Self-care/ADL training;Therapeutic exercise;Energy conservation;Balance training;Patient/family education;Cognitive remediation/compensation;Therapeutic activities;DME and/or AE instruction    OT Goals(Current goals can be found in the care plan section) Acute Rehab OT Goals Patient Stated Goal: go for a walk OT Goal Formulation: With patient Time For Goal Achievement: 04/26/18 Potential to Achieve Goals: Good  OT Frequency: Min 2X/week    AM-PAC OT "6 Clicks" Daily Activity     Outcome Measure Help from another person eating meals?: None Help from another person taking care of personal grooming?: A Little Help from another person toileting, which includes using toliet, bedpan, or urinal?: A Little Help from another person bathing (including washing, rinsing, drying)?: A Little Help from another person to put on and taking off regular upper body clothing?: A Little Help from another person to put on and taking off regular lower body clothing?: A Little 6 Click Score: 19   End of Session Equipment Utilized During Treatment: Oxygen Nurse Communication: Mobility status;Other (comment)(pt OOB upon entry)  Activity Tolerance: Patient tolerated treatment well Patient left: in bed;with  bed alarm set;with call bell/phone within reach  OT Visit Diagnosis: Unsteadiness on feet (R26.81);Muscle weakness (generalized) (M62.81)                Time: 9622-2979 OT Time Calculation (min): 15 min Charges:  OT General Charges $OT Visit: 1 Visit OT Evaluation $OT Eval Low Complexity: 1 Low  Curtis Sites OTR/L  04/17/2018, 4:35 PM

## 2018-04-17 NOTE — Progress Notes (Signed)
Family Medicine Teaching Service Daily Progress Note Intern Pager: [phone]  Patient name: [name] Medical record number: [id number] Date of birth: [birth year] Age: 85 y.o. Gender: adult  Primary Care Provider: Guadalupe Dawn, MD Consultants: none Code Status: Limited code  Pt Overview and Major Events to Date:  Hospital Day 2 Admitted: 04/15/2018   Assessment and Plan: [name] is a 85 y.o. adult who presented with acute respiratory failure concerning for Pneumonia.  Patient was in ICU for sepsis, now on room air. Transferred to family medicine on 2/11. PMHx includes asthma, COPD, DM-II.   Pneumonia - patient was septic initially.  Initially required bipap. Now on room air. Currently on CTX and azithromycin (2/9 -2/15 ).  RVP negative. Wbc 25.6. No growth in blood cultures. MRSA PCR neg.  - continue CTX and azithro.  Consider switching to cefdinir and PO azithro tomorrow.  - wean supplemental O2 as tolerated.   COPD - patient takes albuterol nebulizer, spiriva, and advair at home.  - d/c pulmicort neb BID - dulera BID - spiriva  - d/c duoneb TID - albuterol neb PRN -d/c legionella and strep pneumo  NAGMA - likely due to diarrhea and normal saline.   - f/u BMP  Hypomagnesemia - resolved. 1.1 yesterday.  1.9 today - f/u BMP  Anemia - 12.4 yesterday.  - f/u CBC  Diarrhea - 3 days ago.  C. Diff neg.  - imodium 4mg  + 2mg  PRN, max 16mg  - start probiotic  DM-II - moderated SSI  Gout  - restart allopurinol 300mg   Electrolytes:   Nutrition: heart healthy/carb modified GI ppx: none DVT px: lovenox  Disposition: home   Medications: Scheduled Meds: . budesonide (PULMICORT) nebulizer solution  0.5 mg Nebulization BID  . heparin  5,000 Units Subcutaneous Q8H  . insulin aspart  0-15 Units Subcutaneous TID AC & HS  . ipratropium-albuterol  3 mL Nebulization TID   Continuous Infusions: . sodium chloride Stopped (04/16/18 2157)  . azithromycin  Stopped (04/16/18 1208)  . cefTRIAXone (ROCEPHIN)  IV Stopped (04/16/18 1248)  . lactated ringers Stopped (04/16/18 1703)   PRN Meds: sodium chloride, acetaminophen, acetaminophen  ================================================= ================================================= Subjective:  Patient has been complaining of diarrhea for the past two days.  It is nonbloody nonbilious. He states his respiratory status is much better.  At home he takes his inhalers regularly.    Objective: Temp:  [98.5 F (36.9 C)-99.9 F (37.7 C)] 99.9 F (37.7 C) (02/11 0433) Pulse Rate:  [46-119] 109 (02/11 0433) Resp:  [17-32] 18 (02/11 0433) BP: (130-153)/(72-117) 141/95 (02/11 0433) SpO2:  [94 %-99 %] 98 % (02/11 0433) Intake/Output 02/10 0701 - 02/11 0700 In: 1182.6 [I.V.:125.5; IV Piggyback:1057.1] Out: 353 [Urine:451; Stool:1] Physical Exam:  Gen: NAD, alert, non-toxic, well-nourished, well-appearing, sitting comfortably  CV: distant heart sounds. Regular rate and rhythm.  Radial pulses 2+ bilaterally. No bilateral lower extremity edema. Resp: decreased breath sounds.  Low pitch wheezing heard bilaterally.  No increased work of breathing appreciated. On 3L O2.  Abd: Nontender and nondistended on palpation to all 4 quadrants.  Positive bowel sounds. Psych: Cooperative with exam. Pleasant. Makes eye contact. Extremities: Full ROM    Laboratory: Recent Labs  Lab 04/15/18 0050 04/15/18 0258 04/16/18 0600  WBC 24.7*  --  25.6*  HGB 14.2 11.6* 12.4*  HCT 40.8 34.0* 36.6*  PLT 193  --  172   Recent Labs  Lab 04/15/18 0050 04/15/18 0258 04/16/18 0600 04/17/18 0456  NA 137 141 139 139  K  3.9 3.9 3.4* 3.6  CL 107  --  110 113*  CO2 16*  --  15* 15*  BUN 28*  --  27* 23  CREATININE 1.99*  --  1.60* 1.46*  CALCIUM 9.0  --  8.2* 8.8*  PROT 6.5  --   --   --   BILITOT 1.0  --   --   --   ALKPHOS 69  --   --   --   ALT 30  --   --   --   AST 37  --   --   --   GLUCOSE 165*  --   139* 148*     Imaging/Diagnostic Tests: Dg Chest Port 1 View  Result Date: 04/16/2018 CLINICAL DATA:  Pneumonia EXAM: PORTABLE CHEST 1 VIEW COMPARISON:  Yesterday FINDINGS: Unchanged asymmetric left lung opacity. Normal heart size and mediastinal contours. There is a background of COPD. IMPRESSION: History of pneumonia with unchanged asymmetric left-sided lung opacity. Followup PA and lateral chest X-ray is recommended in 3-4 weeks following trial of antibiotic therapy to ensure resolution and exclude underlying malignancy in this patient with COPD. Electronically Signed   By: Monte Fantasia M.D.   On: 04/16/2018 05:53      Benay Pike, MD 04/17/2018, 6:40 AM PGY-1, [place] Intern pager: [phone], text pages welcome

## 2018-04-18 DIAGNOSIS — E876 Hypokalemia: Secondary | ICD-10-CM

## 2018-04-18 DIAGNOSIS — J449 Chronic obstructive pulmonary disease, unspecified: Secondary | ICD-10-CM

## 2018-04-18 LAB — CBC WITH DIFFERENTIAL/PLATELET
Abs Immature Granulocytes: 0.2 10*3/uL — ABNORMAL HIGH (ref 0.00–0.07)
Basophils Absolute: 0.1 10*3/uL (ref 0.0–0.1)
Basophils Relative: 0 %
Eosinophils Absolute: 0.6 10*3/uL — ABNORMAL HIGH (ref 0.0–0.5)
Eosinophils Relative: 3 %
HCT: 35.3 % — ABNORMAL LOW (ref 39.0–52.0)
Hemoglobin: 12.4 g/dL — ABNORMAL LOW (ref 13.0–17.0)
Immature Granulocytes: 1 %
Lymphocytes Relative: 9 %
Lymphs Abs: 1.7 10*3/uL (ref 0.7–4.0)
MCH: 26.6 pg (ref 26.0–34.0)
MCHC: 35.1 g/dL (ref 30.0–36.0)
MCV: 75.6 fL — ABNORMAL LOW (ref 80.0–100.0)
Monocytes Absolute: 1.7 10*3/uL — ABNORMAL HIGH (ref 0.1–1.0)
Monocytes Relative: 9 %
Neutro Abs: 14.6 10*3/uL — ABNORMAL HIGH (ref 1.7–7.7)
Neutrophils Relative %: 78 %
Platelets: 211 10*3/uL (ref 150–400)
RBC: 4.67 MIL/uL (ref 4.22–5.81)
RDW: 15 % (ref 11.5–15.5)
WBC: 18.8 10*3/uL — ABNORMAL HIGH (ref 4.0–10.5)
nRBC: 0 % (ref 0.0–0.2)

## 2018-04-18 LAB — BASIC METABOLIC PANEL
Anion gap: 11 (ref 5–15)
BUN: 27 mg/dL — ABNORMAL HIGH (ref 8–23)
CO2: 15 mmol/L — ABNORMAL LOW (ref 22–32)
Calcium: 8.5 mg/dL — ABNORMAL LOW (ref 8.9–10.3)
Chloride: 111 mmol/L (ref 98–111)
Creatinine, Ser: 1.46 mg/dL — ABNORMAL HIGH (ref 0.61–1.24)
GFR calc Af Amer: 51 mL/min — ABNORMAL LOW (ref >60)
GFR calc non Af Amer: 44 mL/min — ABNORMAL LOW (ref >60)
Glucose, Bld: 143 mg/dL — ABNORMAL HIGH (ref 70–99)
Potassium: 3.3 mmol/L — ABNORMAL LOW (ref 3.5–5.1)
Sodium: 137 mmol/L (ref 135–145)

## 2018-04-18 LAB — GLUCOSE, CAPILLARY
Glucose-Capillary: 144 mg/dL — ABNORMAL HIGH (ref 70–99)
Glucose-Capillary: 132 mg/dL — ABNORMAL HIGH (ref 70–99)
Glucose-Capillary: 128 mg/dL — ABNORMAL HIGH (ref 70–99)
Glucose-Capillary: 103 mg/dL — ABNORMAL HIGH (ref 70–99)

## 2018-04-18 LAB — TSH: TSH: 1.662 u[IU]/mL (ref 0.350–4.500)

## 2018-04-18 MED ORDER — AZITHROMYCIN 250 MG PO TABS: ORAL_TABLET | ORAL | 0 refills | Status: AC

## 2018-04-18 MED ORDER — CEFDINIR 300 MG PO CAPS: 300.0000 mg | ORAL_CAPSULE | Freq: Two times a day (BID) | ORAL | 0 refills | Status: AC

## 2018-04-18 MED ORDER — AZITHROMYCIN 250 MG PO TABS
250.0000 mg | ORAL_TABLET | Freq: Every day | ORAL | Status: DC
  Filled 2018-04-18: qty 1

## 2018-04-18 MED ORDER — CEFDINIR 300 MG PO CAPS: 300.0000 mg | ORAL_CAPSULE | Freq: Two times a day (BID) | ORAL | Status: DC

## 2018-04-18 MED ORDER — CEFDINIR 300 MG PO CAPS
300.0000 mg | ORAL_CAPSULE | Freq: Two times a day (BID) | ORAL | Status: DC
  Administered 2018-04-18: 300 mg via ORAL
  Filled 2018-04-18 (×2): qty 1

## 2018-04-18 NOTE — Evaluation (Signed)
Physical Therapy Evaluation Patient Details Name: [name] MRN: [id number] DOB: [birth year] Today's Date: 04/18/2018   History of Present Illness  [name] is a 85 year old male past medical history of NIDDM, gout, hypertension, iron deficiency anemia, COPD, CKD presents on 2/9 to [facility] with complaints of dyspnea and productive cough, temperature 101.2 F, O2 sat 82% on room air and borderline hypotension.     Clinical Impression  Pt presented supine in bed with HOB elevated, awake and willing to participate in therapy session. Prior to admission, pt reported that he was independent with all functional mobility and ADLs. Pt currently feels that he is much weaker than his baseline. He requires min guard overall for transfers and ambulation with RW. Pt ambulated on RA with SPO2 mostly maintaining in the lower 90's, but decreasing to as low as 80% towards end of ambulation. At end of session with pt sitting in recliner chair on RA, SPO2 >90%. HR during ambulation increasing to as high as 124 bpm. Pt asymptomatic throughout. Pt would continue to benefit from skilled physical therapy services at this time while admitted and after d/c to address the below listed limitations in order to improve overall safety and independence with functional mobility.     Follow Up Recommendations Home health PT;Supervision - Intermittent    Equipment Recommendations  None recommended by PT    Recommendations for Other Services       Precautions / Restrictions Precautions Precautions: Fall Restrictions Weight Bearing Restrictions: No      Mobility  Bed Mobility Overal bed mobility: Modified Independent                Transfers Overall transfer level: Needs assistance Equipment used: None;Rolling walker (2 wheeled) Transfers: Sit to/from [facility] Sit to Stand: Min guard Stand pivot transfers: Min guard       General transfer comment: min guard for  safety  Ambulation/Gait Ambulation/Gait assistance: Min guard Gait Distance (Feet): 100 Feet Assistive device: Rolling walker (2 wheeled) Gait Pattern/deviations: Step-through pattern;Decreased stride length Gait velocity: decreased   General Gait Details: no instability noted, min guard for safety, pt on RA during ambulation with SPO2 decreasing to as low as 80% and HR increasing to as high as 124 bpm. Pt asymptomatic throughout  Stairs            Wheelchair Mobility    Modified Rankin (Stroke Patients Only)       Balance Overall balance assessment: Needs assistance Sitting-balance support: Feet supported Sitting balance-Leahy Scale: Good     Standing balance support: No upper extremity supported Standing balance-Leahy Scale: Fair                               Pertinent Vitals/Pain Pain Assessment: No/denies pain    Home Living Family/patient expects to be discharged to:: Private residence Living Arrangements: Spouse/significant other Available Help at Discharge: Family;Available 24 hours/day Type of Home: House Home Access: Stairs to enter Entrance Stairs-Rails: Psychiatric nurse of Steps: 5-6 Home Layout: Two level Home Equipment: Walker - 2 wheels;Shower seat Additional Comments: Per chart review    Prior Function Level of Independence: Independent               Hand Dominance        Extremity/Trunk Assessment   Upper Extremity Assessment Upper Extremity Assessment: Generalized weakness    Lower Extremity Assessment Lower Extremity Assessment: Generalized weakness    Cervical /  Trunk Assessment Cervical / Trunk Assessment: Normal  Communication   Communication: HOH  Cognition Arousal/Alertness: Awake/alert Behavior During Therapy: WFL for tasks assessed/performed Overall Cognitive Status: Impaired/Different from baseline Area of Impairment: Memory;Safety/judgement;Awareness;Problem solving                      Memory: Decreased short-term memory;Decreased recall of precautions   Safety/Judgement: Decreased awareness of safety;Decreased awareness of deficits Awareness: Intellectual Problem Solving: Requires verbal cues        General Comments      Exercises     Assessment/Plan    PT Assessment Patient needs continued PT services  PT Problem List Decreased balance;Decreased mobility;Decreased coordination;Decreased safety awareness;Decreased knowledge of precautions       PT Treatment Interventions DME instruction;Gait training;Stair training;Therapeutic exercise;Therapeutic activities;Functional mobility training;Balance training;Neuromuscular re-education;Patient/family education    PT Goals (Current goals can be found in the Care Plan section)  Acute Rehab PT Goals Patient Stated Goal: "go home today I hope" PT Goal Formulation: With patient Time For Goal Achievement: 05/02/18 Potential to Achieve Goals: Good    Frequency Min 3X/week   Barriers to discharge        Co-evaluation               AM-PAC PT "6 Clicks" Mobility  Outcome Measure Help needed turning from your back to your side while in a flat bed without using bedrails?: None Help needed moving from lying on your back to sitting on the side of a flat bed without using bedrails?: None Help needed moving to and from a bed to a chair (including a wheelchair)?: None Help needed standing up from a chair using your arms (e.g., wheelchair or bedside chair)?: None Help needed to walk in hospital room?: A Little Help needed climbing 3-5 steps with a railing? : A Lot 6 Click Score: 21    End of Session Equipment Utilized During Treatment: Gait belt Activity Tolerance: Patient tolerated treatment well Patient left: in chair;with call bell/phone within reach;Other (comment)(NT present) Nurse Communication: Mobility status PT Visit Diagnosis: Muscle weakness (generalized) (M62.81)    Time:  6808-8110 PT Time Calculation (min) (ACUTE ONLY): 24 min   Charges:   PT Evaluation $PT Eval Moderate Complexity: 1 Mod PT Treatments $Gait Training: 8-22 mins        Sherie Don, PT, DPT  Acute Rehabilitation Services Pager [phone] Office [place] 04/18/2018, 10:54 AM

## 2018-04-18 NOTE — Progress Notes (Signed)
Patient is discharging home with home health. Family is here for transport. All discharge paperwork went over with patient and family. All questions and concerns addressed. [place]

## 2018-04-18 NOTE — Care Management Note (Signed)
Case Management Note  Patient Details  Name: [name] MRN: [id number] Date of Birth: 1934-03-03  Subjective/Objective:                    Action/Plan: Pt discharging home with orders for [facility] services. CM provided choice and [facility] selected. CM notified Dorian Pod with [facility] and she accepted the referral.  Pt with orders for home oxygen. Pt selected [facility] for his home oxygen needs. Jermaine with [facility] DME notified and will have oxygen delivered to the home and the room. Pt has transportation home.  Expected Discharge Date:  04/18/18               Expected Discharge Plan:  [place]  In-House Referral:     Discharge planning Services  CM Consult  Post Acute Care Choice:  Home Health, Durable Medical Equipment Choice offered to:  Patient  DME Arranged:  Oxygen DME Agency:  [place] Arranged:  PT, OT [facility] Agency:  [facility]  Status of Service:  Completed, signed off  If discussed at [place] of Stay Meetings, dates discussed:    Additional Comments:  Pollie Friar, RN 04/18/2018, 3:20 PM

## 2018-04-18 NOTE — Progress Notes (Signed)
SATURATION QUALIFICATIONS: (This note is used to comply with regulatory documentation for home oxygen)  Patient Saturations on Room Air at Rest = 93%  Patient Saturations on Room Air while Ambulating = 84%  Patient Saturations on 2 Liters of oxygen while Ambulating = 95%  Please briefly explain why patient needs home oxygen: Patient was able to ambulate a short distance before o2 saturation dropping below 88%. Patient remained above 88% on 2L [place].

## 2018-04-18 NOTE — Progress Notes (Signed)
Family Medicine Teaching Service Daily Progress Note Intern Pager: [phone]  Patient name: [name] Medical record number: [id number] Date of birth: [birth year] Age: 85 y.o. Gender: adult  Primary Care Provider: Guadalupe Dawn, MD Consultants: none Code Status: Limited code  Pt Overview and Major Events to Date:  Hospital Day 3 Admitted: 04/15/2018   Assessment and Plan: [name] is a 85 y.o. adult who presented with acute respiratory failure concerning for Pneumonia.  Patient was in ICU for sepsis, now on room air. Transferred to family medicine on 2/11. PMHx includes asthma, COPD, DM-II.   Pneumonia - patient was septic initially.  Initially required bipap. Now on room air. Currently on CTX and azithromycin (2/9 -2/15).  RVP negative. Wbc 25.6. No growth in blood cultures. MRSA PCR neg.  - d/c CTX and azithro.   - start PO azithro and cefdinir - wean supplemental O2 as tolerated.  - ambulatory pulse ox - home health OT; awaiting PT eval.    COPD - patient takes albuterol nebulizer, spiriva, and advair at home.  - dulera BID - spiriva  - albuterol neb PRN  Tachycardia - persitently above 100.  EKG on initial visit was SVT.   - f/u EKG  NAGMA - likely due to diarrhea and normal saline.  Bicarb at 15.  - f/u BMP  Hypomagnesemia - resolved.  - f/u BMP  Anemia - 12.4.  MCV of 75  - f/u CBC  Diarrhea - 3 days ago.  C. Diff neg. No diarrhea since - imodium 4mg  + 2mg  PRN, max 16mg  - start probiotic  DM-II - moderate SSI  Gout  - restart allopurinol 300mg   Electrolytes:   Nutrition: heart healthy/carb modified GI ppx: none DVT px: lovenox  Disposition: home   Medications: Scheduled Meds: . allopurinol  300 mg Oral Daily  . cycloSPORINE  1 drop Both Eyes BID  . enoxaparin (LOVENOX) injection  40 mg Subcutaneous Q24H  . insulin aspart  0-15 Units Subcutaneous TID AC & HS  . lactobacillus acidophilus  2 tablet Oral TID  .  mometasone-formoterol  2 puff Inhalation BID  . umeclidinium bromide  1 puff Inhalation Daily   Continuous Infusions: . sodium chloride Stopped (04/16/18 2157)  . azithromycin 500 mg (04/17/18 6144)  . cefTRIAXone (ROCEPHIN)  IV 2 g (04/17/18 2023)   PRN Meds: sodium chloride, acetaminophen, acetaminophen, albuterol, [COMPLETED] loperamide **FOLLOWED BY** loperamide  ================================================= ================================================= Subjective:  patient's diarrhea has improved. Patient states he feels better when he is up and moving. States he feels ready to go home today.     Objective: Temp:  [97.8 F (36.6 C)-99.1 F (37.3 C)] 99.1 F (37.3 C) (02/12 0448) Pulse Rate:  [104-118] 104 (02/12 0448) Resp:  [16-20] 20 (02/12 0448) BP: (111-153)/(73-91) 111/73 (02/12 0448) SpO2:  [92 %-97 %] 92 % (02/12 0448) Intake/Output 02/11 0701 - 02/12 0700 In: 240 [P.O.:240] Out: -  Physical Exam:  Gen: NAD, alert, non-toxic, well-nourished, well-appearing, sitting comfortably  CV: distant heart sounds. Regular rate and rhythm.  Radial pulses 2+ bilaterally. No bilateral lower extremity edema. Resp: mildly decreased breath sounds.  Minimal wheezing heard No increased work of breathing appreciated. On 0.5L O2.  Abd: Nontender and nondistended on palpation to all 4 quadrants.  Positive bowel sounds. Psych: Cooperative with exam. Pleasant. Makes eye contact. Extremities: Full ROM    Laboratory: Recent Labs  Lab 04/16/18 0600 04/17/18 1150 04/18/18 0500  WBC 25.6* 24.8* 18.8*  HGB 12.4* 13.1 12.4*  HCT 36.6* 37.2*  35.3*  PLT 172 231 211   Recent Labs  Lab 04/15/18 0050 04/15/18 0258 04/16/18 0600 04/17/18 0456  NA 137 141 139 139  K 3.9 3.9 3.4* 3.6  CL 107  --  110 113*  CO2 16*  --  15* 15*  BUN 28*  --  27* 23  CREATININE 1.99*  --  1.60* 1.46*  CALCIUM 9.0  --  8.2* 8.8*  PROT 6.5  --   --   --   BILITOT 1.0  --   --   --   ALKPHOS 69   --   --   --   ALT 30  --   --   --   AST 37  --   --   --   GLUCOSE 165*  --  139* 148*     Imaging/Diagnostic Tests: No results found.    Benay Pike, MD 04/18/2018, 6:31 AM PGY-1, [place] Intern pager: [phone], text pages welcome

## 2018-04-18 NOTE — Discharge Instructions (Signed)
You have Pneumonia, which is made worse by your COPD.  Please take your antibiotics as prescribed.  You will finish your Azithromycin on 2/13 and your Cefdinir on 2/18.    We prescribed home oxygen to use because your oxygen levels are going low when you walk around.  Please wear this while you are walking around until you have a chance to follow up with your primary care provider.   We have scheduled you an appointment in our clinic for 2/14.    Please call your doctor or come back to the hospital if your breathing worsens.

## 2018-04-19 ENCOUNTER — Other Ambulatory Visit: Payer: Self-pay | Admitting: Family Medicine

## 2018-04-19 LAB — CULTURE, RESPIRATORY

## 2018-04-19 LAB — CULTURE, RESPIRATORY W GRAM STAIN

## 2018-04-19 MED ORDER — FLUTICASONE FUROATE-VILANTEROL 200-25 MCG/INH IN AEPB: 1.0000 | INHALATION_SPRAY | Freq: Every day | RESPIRATORY_TRACT | 0 refills | Status: DC

## 2018-04-19 NOTE — Telephone Encounter (Signed)
Called and informed patient of switch in medication.  [name], [name]

## 2018-04-19 NOTE — Telephone Encounter (Signed)
Switched patient to breo 200/25 one puff daily. This is a switch from his advair which was two times per day. Please let patient know this has been sent in.  Guadalupe Dawn MD PGY-2 Family Medicine Resident

## 2018-04-19 NOTE — Addendum Note (Signed)
Addended by: Pauletta Browns on: 04/19/2018 09:28 AM   Modules accepted: Orders

## 2018-04-20 ENCOUNTER — Telehealth: Payer: Self-pay

## 2018-04-20 ENCOUNTER — Ambulatory Visit: Payer: PPO

## 2018-04-20 DIAGNOSIS — J440 Chronic obstructive pulmonary disease with acute lower respiratory infection: Secondary | ICD-10-CM | POA: Diagnosis not present

## 2018-04-20 DIAGNOSIS — I129 Hypertensive chronic kidney disease with stage 1 through stage 4 chronic kidney disease, or unspecified chronic kidney disease: Secondary | ICD-10-CM | POA: Diagnosis not present

## 2018-04-20 DIAGNOSIS — M5126 Other intervertebral disc displacement, lumbar region: Secondary | ICD-10-CM | POA: Diagnosis not present

## 2018-04-20 DIAGNOSIS — L853 Xerosis cutis: Secondary | ICD-10-CM | POA: Diagnosis not present

## 2018-04-20 DIAGNOSIS — D509 Iron deficiency anemia, unspecified: Secondary | ICD-10-CM | POA: Diagnosis not present

## 2018-04-20 DIAGNOSIS — M109 Gout, unspecified: Secondary | ICD-10-CM | POA: Diagnosis not present

## 2018-04-20 DIAGNOSIS — E1140 Type 2 diabetes mellitus with diabetic neuropathy, unspecified: Secondary | ICD-10-CM | POA: Diagnosis not present

## 2018-04-20 DIAGNOSIS — M170 Bilateral primary osteoarthritis of knee: Secondary | ICD-10-CM | POA: Diagnosis not present

## 2018-04-20 DIAGNOSIS — J449 Chronic obstructive pulmonary disease, unspecified: Secondary | ICD-10-CM | POA: Diagnosis not present

## 2018-04-20 DIAGNOSIS — E785 Hyperlipidemia, unspecified: Secondary | ICD-10-CM | POA: Diagnosis not present

## 2018-04-20 DIAGNOSIS — Z9181 History of falling: Secondary | ICD-10-CM | POA: Diagnosis not present

## 2018-04-20 DIAGNOSIS — Z7951 Long term (current) use of inhaled steroids: Secondary | ICD-10-CM | POA: Diagnosis not present

## 2018-04-20 DIAGNOSIS — E1122 Type 2 diabetes mellitus with diabetic chronic kidney disease: Secondary | ICD-10-CM | POA: Diagnosis not present

## 2018-04-20 DIAGNOSIS — N183 Chronic kidney disease, stage 3 (moderate): Secondary | ICD-10-CM | POA: Diagnosis not present

## 2018-04-20 DIAGNOSIS — I471 Supraventricular tachycardia: Secondary | ICD-10-CM | POA: Diagnosis not present

## 2018-04-20 DIAGNOSIS — G548 Other nerve root and plexus disorders: Secondary | ICD-10-CM | POA: Diagnosis not present

## 2018-04-20 DIAGNOSIS — J189 Pneumonia, unspecified organism: Secondary | ICD-10-CM | POA: Diagnosis not present

## 2018-04-20 DIAGNOSIS — Z7984 Long term (current) use of oral hypoglycemic drugs: Secondary | ICD-10-CM | POA: Diagnosis not present

## 2018-04-20 DIAGNOSIS — Z9981 Dependence on supplemental oxygen: Secondary | ICD-10-CM | POA: Diagnosis not present

## 2018-04-20 LAB — CULTURE, BLOOD (ROUTINE X 2)
Culture: NO GROWTH
Culture: NO GROWTH
Special Requests: ADEQUATE

## 2018-04-20 NOTE — Telephone Encounter (Signed)
Pramob, PT with [facility], called nurse line requesting verbal orders for HHPT 2x week for 3 weeks.   Of note, Pramob stated the patients resting HR was 107 and ambulatory 120.  Will forward to PCP

## 2018-04-23 ENCOUNTER — Other Ambulatory Visit: Payer: Self-pay

## 2018-04-23 ENCOUNTER — Telehealth: Payer: Self-pay

## 2018-04-23 NOTE — Telephone Encounter (Signed)
Verbal orders given to [facility]  Guadalupe Dawn MD PGY-2 Family Medicine Resident

## 2018-04-23 NOTE — Patient Outreach (Addendum)
[facility]) Care Management  [name]   04/23/2018  [name] Nov 14, 1933 [id number]  Reason for referral: Medication Reconciliation Post Discharge  Current insurance:[employer]  PMHx includes but not limited to:  Hypertension, asthma, COPD, type 2 diabetes mellitus, CKD Stage 3, gout, dyslipidemia  Outreach:  Successful telephone call with [name].  HIPAA identifiers verified.   Subjective:  [name] reports that that he is feeling ok.  He states that [name] is coming out tomorrow.  He states that he would like help scheduling a PCP appointment, so call made and he has an appointment [name] (2/21) at 2:10 pm.    Objective: Lab Results  Component Value Date   CREATININE 1.46 (H) 04/18/2018   CREATININE 1.46 (H) 04/17/2018   CREATININE 1.60 (H) 04/16/2018    Lab Results  Component Value Date   HGBA1C 7.5 (A) 01/04/2018    Lipid Panel     Component Value Date/Time   CHOL 209 (H) 02/25/2014 1110   TRIG 189 (H) 02/25/2014 1110   HDL 28 (L) 02/25/2014 1110   CHOLHDL 7.5 02/25/2014 1110   VLDL 38 02/25/2014 1110   LDLCALC 143 (H) 02/25/2014 1110   LDLDIRECT 65 07/09/2013 0922    BP Readings from Last 3 Encounters:  04/18/18 129/84  01/04/18 138/76  01/01/18 134/84    No Known Allergies  Medications Reviewed Today    Reviewed by Dionne Milo, [place] (Pharmacist) on 04/23/18 at 1439  Med List Status: <None>  Medication Order Taking? Sig Documenting Provider Last Dose Status Informant  albuterol (PROVENTIL HFA;VENTOLIN HFA) 108 (90 Base) MCG/ACT inhaler [id number] No Inhale 2 puffs into the lungs every 6 (six) hours as needed for wheezing or shortness of breath.  Patient not taking:  Reported on 04/23/2018   Eloise Levels, MD Not Taking Active Spouse/Significant Other  albuterol (PROVENTIL) (2.5 MG/3ML) 0.083% nebulizer solution [id number] No Take 3 mLs (2.5 mg total) by nebulization every 6 (six)  hours as needed for wheezing or shortness of breath.  Patient not taking:  Reported on 04/23/2018   Rogue Bussing, MD Not Taking Active Spouse/Significant Other  allopurinol (ZYLOPRIM) 300 MG tablet [id number] Yes Take 1 tablet (300 mg total) by mouth daily. Guadalupe Dawn, MD Taking Active Spouse/Significant Other  cefdinir (OMNICEF) 300 MG capsule [id number] Yes Take 1 capsule (300 mg total) by mouth every 12 (twelve) hours for 11 doses. Benay Pike, MD Taking Active         Discontinued 04/23/18 1426 (No longer needed (for PRN medications))   diclofenac sodium (VOLTAREN) 1 % GEL [id number] Yes APPLY 2 GRAMS TO AFFECTED AREA 4 TIMES A DAY  Patient taking differently:  Apply 2 g topically 4 (four) times daily.    Guadalupe Dawn, MD Taking Active Spouse/Significant Other  fluticasone furoate-vilanterol (BREO ELLIPTA) 200-25 MCG/INH AEPB [id number] Yes Inhale 1 puff into the lungs daily. Guadalupe Dawn, MD Taking Active   gabapentin (NEURONTIN) 100 MG capsule [id number] No TAKE 1 CAPSULE BY MOUTH THREE TIMES A DAY  Patient taking differently:  Take 100 mg by mouth 3 (three) times daily.    Guadalupe Dawn, MD Unknown Active Spouse/Significant Other  glucose blood (ONE TOUCH ULTRA TEST) test strip [id number] Yes Use as instructed.  Dispense:   Quantity sufficient for one month supply of once daily testing. Zenia Resides, MD Taking Active Spouse/Significant Other  Lancets [facility] ULTRASOFT) lancets [id number] Yes Use as instructed.  Dispense Quantity Sufficient for one month  supply of once daily testing. Zenia Resides, MD Taking Active Spouse/Significant Other  metFORMIN (GLUCOPHAGE) 500 MG tablet [id number] No Take 1 tablet (500 mg total) by mouth 2 (two) times daily with a meal. Hensel, Jamal Collin, MD Unknown Active Spouse/Significant Other  tiotropium (SPIRIVA HANDIHALER) 18 MCG inhalation capsule [id number] No PLACE 1 CAPSULE (18 MCG TOTAL) INTO INHALER AND INHALE DAILY.  Patient  taking differently:  Place 18 mcg into inhaler and inhale daily.    Guadalupe Dawn, MD Unknown Active Spouse/Significant Other         ASSESSMENT: Date Discharged from Hospital: 04/18/18 Date Medication Reconciliation Performed: 04/23/2018  Medications:  New at Discharge: . Cefdinir . Azithromycin (completed)  . Breo Ellipta (started on 04/19/18 by PCP)  Patient was recently discharged from hospital and all medications have been reviewed.  Assessment:  Drugs sorted by system:  Pulmonary/Allergy: albuterol MDI, albuterol neb, fluticasone furoate/vilanterol, tiotopium  Endocrine: metformin   Pain: diclofenac sodium gel, gabapentin  Infectious Diseases: cefdinir  Miscellaneous: allopurinol  Medication Review Findings:   Metformin, gabapentin, tiotropium - patient states these medications were not on his discharge instructions to continue.  Discharge note from 2/11/20202 has them listed to continue, but note from 04/18/2018 does not mention gabapentin or metformin.  Please clarify if patient should continue metformin, gabapentin and tiotropium.  He had Breo added on 04/19/18.  Albuterol- patient reports that he is not using his albuterol MDI or nebs.    Informed patient to take all his medications to his PCP visit [name] for review.  Plan: Will route note to PCP, Dr. Kris Mouton.    Will follow up with [name] after PCP visit to see if he has further questions.   Joetta Manners, PharmD Clinical Pharmacist [place] [phone]

## 2018-04-23 NOTE — Telephone Encounter (Signed)
Phone call from [facility] pharmacist. There is some confusion as to whether patient should be taking Metformin, Gabapentin, and Spiriva. Patient has an appt this coming Friday.  [name], RN [facility] RN)

## 2018-04-24 ENCOUNTER — Telehealth: Payer: Self-pay

## 2018-04-24 NOTE — Telephone Encounter (Signed)
Patient daughter called. Patient was given O2 orders and they brought big tanks. Needs an order for portable oxygen sent to [facility]. Is worried about getting him here for appt on Friday without portable oxygen.  [name], RN [facility] RN)

## 2018-04-27 ENCOUNTER — Ambulatory Visit (INDEPENDENT_AMBULATORY_CARE_PROVIDER_SITE_OTHER): Payer: PPO | Admitting: Family Medicine

## 2018-04-27 ENCOUNTER — Other Ambulatory Visit: Payer: Self-pay

## 2018-04-27 ENCOUNTER — Encounter: Payer: Self-pay | Admitting: Family Medicine

## 2018-04-27 VITALS — BP 128/72 | HR 113 | Temp 97.4°F | Ht 71.0 in | Wt 177.8 lb

## 2018-04-27 DIAGNOSIS — E875 Hyperkalemia: Secondary | ICD-10-CM | POA: Insufficient documentation

## 2018-04-27 DIAGNOSIS — E876 Hypokalemia: Secondary | ICD-10-CM

## 2018-04-27 DIAGNOSIS — J189 Pneumonia, unspecified organism: Secondary | ICD-10-CM

## 2018-04-27 DIAGNOSIS — E1140 Type 2 diabetes mellitus with diabetic neuropathy, unspecified: Secondary | ICD-10-CM | POA: Diagnosis not present

## 2018-04-27 DIAGNOSIS — E1165 Type 2 diabetes mellitus with hyperglycemia: Secondary | ICD-10-CM

## 2018-04-27 DIAGNOSIS — IMO0002 Reserved for concepts with insufficient information to code with codable children: Secondary | ICD-10-CM

## 2018-04-27 DIAGNOSIS — J449 Chronic obstructive pulmonary disease, unspecified: Secondary | ICD-10-CM | POA: Diagnosis not present

## 2018-04-27 DIAGNOSIS — Z09 Encounter for follow-up examination after completed treatment for conditions other than malignant neoplasm: Secondary | ICD-10-CM

## 2018-04-27 LAB — POCT GLYCOSYLATED HEMOGLOBIN (HGB A1C): HbA1c, POC (controlled diabetic range): 6.6 % (ref 0.0–7.0)

## 2018-04-27 NOTE — Patient Instructions (Addendum)
It was great seeing you today!  I am sorry had such a hard time with your pneumonia in the hospital.  I will add your doing better though.  I think we could probably get you off the oxygen sometime soon.  I will call [facility] home [facility] about getting you longer tubing for your oxygen tank as well as a smaller tank.  We will see how you do with PT on Tuesday, if they see do well we can probably stop the oxygen then.  Continue taking your Breo and your Spiriva.  Your A1c is 6.6, congratulations this is improved from 7.4 three months ago.  We will check your magnesium and potassium level.  These are low in the hospital, I like to make sure they are still normal.

## 2018-04-27 NOTE — Progress Notes (Signed)
   HPI 85 year old male who presents for hospital follow-up.  Patient admitted on 04/17/2018 for pneumonia.  Patient initially required stay in ICU with multiple fluid boluses to maintain pressure and BiPAP.  He was discharged on Omnicef and azithromycin.  Medication stopped during hospitalization include metformin, cetirizine, gabapentin.  His Advair was recently changed to Brio due to formulary reasons, Spiriva continued.  He was noted to have a low magnesium and potassium during his hospitalization.  He reports that he is doing well.  He is better than when he left the hospital, but is still getting short of breath with activity.  He states that his physical therapist is walking him and he had a desat down to 91%.  Unfortunately have not received a note from physical therapy.  Patient does state that he has been subjectively feeling short of breath with activity, more so than before his hospitalization.  He denies any chest pain, nausea, vomiting, diarrhea.  He reports that intermittently his nose has been dry from the oxygen therapy.  States that once he had a very small nosebleed from this.  He has been applying petroleum jelly which is been helping the problem.   CC: Hospital follow-up   ROS:   Review of Systems See HPI for ROS.   CC, SH/smoking status, and VS noted  Objective: BP 128/72   Pulse (!) 113   Temp (!) 97.4 F (36.3 C) (Oral)   Ht 5\' 11"  (1.803 m)   Wt 177 lb 12.8 oz (80.6 kg)   SpO2 93%   BMI 24.80 kg/m  Gen: 85 year old African-American male, no acute distress, resting comfortably.  On 2 L nasal cannula HEENT: Moist mucous membranes, EOMI, PERRLA.  Nasal cannula in place CV: Regular rate and rhythm, no M/R/G. Resp: Lungs clear to auscultation bilaterally, no wheezing, no sensory muscle use Abd: SNTND, BS present, no guarding or organomegaly Neuro: Alert and oriented, Speech clear, No gross deficits   Assessment and plan:  Community acquired pneumonia of left  lung Doing well.  Still has doses of cefdinir left, counseled patient to finish this medication.  Has already finished dose of azithromycin.  Do not feel the chest x-ray would change management anyway.  Weaning off O2 as tolerated.  Hopefully can get off soon.  Chronic obstructive pulmonary disease (HCC) Change Advair to Brio 200/25 for formulary reasons.  Continue Spiriva 18 mcg daily.  On home O2 secondary to pneumonia.  Can likely wean off O2 as tolerated.  Hypokalemia Present in hospital.  K3.3 on discharge.  Will draw BMP to further evaluate.  Hypomagnesemia Problem in hospital, within normal limits at 1.9 prior to discharge.  Will recheck, supplement as necessary.  Uncontrolled type 2 diabetes mellitus with diabetic neuropathy, without long-term current use of insulin (HCC) A1c 6.6 from 7.4.  Metformin stopped in the hospital.  Agree that there is unclear benefit for continuous medication for an 85 year old.   Orders Placed This Encounter  Procedures  . Magnesium  . Basic Metabolic Panel  . HgB A1c    No orders of the defined types were placed in this encounter.    Guadalupe Dawn MD PGY-2 Family Medicine Resident  04/27/2018 4:16 PM

## 2018-04-27 NOTE — Assessment & Plan Note (Signed)
Present in hospital.  K3.3 on discharge.  Will draw BMP to further evaluate.

## 2018-04-27 NOTE — Assessment & Plan Note (Signed)
Doing well.  Still has doses of cefdinir left, counseled patient to finish this medication.  Has already finished dose of azithromycin.  Do not feel the chest x-ray would change management anyway.  Weaning off O2 as tolerated.  Hopefully can get off soon.

## 2018-04-27 NOTE — Assessment & Plan Note (Signed)
Problem in hospital, within normal limits at 1.9 prior to discharge.  Will recheck, supplement as necessary.

## 2018-04-27 NOTE — Assessment & Plan Note (Signed)
Change Advair to Brio 200/25 for formulary reasons.  Continue Spiriva 18 mcg daily.  On home O2 secondary to pneumonia.  Can likely wean off O2 as tolerated.

## 2018-04-27 NOTE — Assessment & Plan Note (Signed)
A1c 6.6 from 7.4.  Metformin stopped in the hospital.  Agree that there is unclear benefit for continuous medication for an 85 year old.

## 2018-04-28 LAB — BASIC METABOLIC PANEL
BUN/Creatinine Ratio: 15 (ref 10–24)
BUN: 21 mg/dL (ref 8–27)
CO2: 21 mmol/L (ref 20–29)
Calcium: 9.3 mg/dL (ref 8.6–10.2)
Chloride: 104 mmol/L (ref 96–106)
Creatinine, Ser: 1.41 mg/dL — ABNORMAL HIGH (ref 0.76–1.27)
GFR calc Af Amer: 53 mL/min/{1.73_m2} — ABNORMAL LOW (ref >59)
GFR calc non Af Amer: 46 mL/min/{1.73_m2} — ABNORMAL LOW (ref >59)
Glucose: 165 mg/dL — ABNORMAL HIGH (ref 65–99)
Potassium: 4.4 mmol/L (ref 3.5–5.2)
Sodium: 141 mmol/L (ref 134–144)

## 2018-04-28 LAB — MAGNESIUM: Magnesium: 1.3 mg/dL — ABNORMAL LOW (ref 1.6–2.3)

## 2018-04-28 NOTE — Telephone Encounter (Signed)
Discussed at visit. Needs spiriva. Disposed of metformin and gabapentin for him.  Guadalupe Dawn MD PGY-2 Family Medicine Resident

## 2018-04-28 NOTE — Telephone Encounter (Signed)
Has portable oxygen at visit. Will consider problem closed.  Guadalupe Dawn MD PGY-2 Family Medicine Resident

## 2018-04-30 ENCOUNTER — Other Ambulatory Visit: Payer: Self-pay

## 2018-04-30 ENCOUNTER — Telehealth: Payer: Self-pay | Admitting: Family Medicine

## 2018-04-30 ENCOUNTER — Ambulatory Visit: Payer: Self-pay

## 2018-04-30 MED ORDER — LEVOCETIRIZINE DIHYDROCHLORIDE 2.5 MG/5ML PO SOLN: 2.5000 mg | Freq: Two times a day (BID) | ORAL | 12 refills | Status: DC | PRN

## 2018-04-30 MED ORDER — MAGNESIUM CHLORIDE 64 MG PO TBEC: 1.0000 | DELAYED_RELEASE_TABLET | Freq: Every day | ORAL | 0 refills | Status: DC

## 2018-04-30 NOTE — Telephone Encounter (Signed)
Family medicine telephone note  Called on discussed low magnesium level with patient.  Explained we will supplement this with magnesium chloride, sustained release form.  To 1 tablet daily, will recheck at next clinic appointment.  Patient having increased dryness of his eyes.  Requesting to be started back on Xyzal.  He request twice daily as he feels like the 1 time a day runs out.  Will start Xyzal 2.5 mg twice daily.  All questions answered.  Guadalupe Dawn MD PGY-2 Family Medicine Resident

## 2018-04-30 NOTE — Patient Outreach (Addendum)
[facility]) Care Management  04/30/2018  [name] 1933-05-01 [id number]  Successful outreach to [name].  HIPAA identifiers verified.   [name] reports that he saw his PCP on Friday and his gabapentin and metformin were discontinued.  He states that he is continuing Spriva and his Advair was changed to [place].  He does not reports any affordability issues.  He is requesting a Xyzal prescription to help with his allergies.    Plan: In-basket message sent to PCP requesting Xyzal prescription be sent to his pharmacy.  Joetta Manners, PharmD Clinical Pharmacist [place] [phone]  Addendum: Xyzal prescription was sent in to his pharmacy.    Plan: Will close his [place] case.  Joetta Manners, PharmD Clinical Pharmacist [place] [phone]

## 2018-05-07 DIAGNOSIS — M5126 Other intervertebral disc displacement, lumbar region: Secondary | ICD-10-CM | POA: Diagnosis not present

## 2018-05-07 DIAGNOSIS — Z7984 Long term (current) use of oral hypoglycemic drugs: Secondary | ICD-10-CM | POA: Diagnosis not present

## 2018-05-07 DIAGNOSIS — G548 Other nerve root and plexus disorders: Secondary | ICD-10-CM | POA: Diagnosis not present

## 2018-05-07 DIAGNOSIS — E1140 Type 2 diabetes mellitus with diabetic neuropathy, unspecified: Secondary | ICD-10-CM | POA: Diagnosis not present

## 2018-05-07 DIAGNOSIS — Z9181 History of falling: Secondary | ICD-10-CM | POA: Diagnosis not present

## 2018-05-07 DIAGNOSIS — D509 Iron deficiency anemia, unspecified: Secondary | ICD-10-CM | POA: Diagnosis not present

## 2018-05-07 DIAGNOSIS — J189 Pneumonia, unspecified organism: Secondary | ICD-10-CM | POA: Diagnosis not present

## 2018-05-07 DIAGNOSIS — J449 Chronic obstructive pulmonary disease, unspecified: Secondary | ICD-10-CM | POA: Diagnosis not present

## 2018-05-07 DIAGNOSIS — I129 Hypertensive chronic kidney disease with stage 1 through stage 4 chronic kidney disease, or unspecified chronic kidney disease: Secondary | ICD-10-CM | POA: Diagnosis not present

## 2018-05-07 DIAGNOSIS — E785 Hyperlipidemia, unspecified: Secondary | ICD-10-CM | POA: Diagnosis not present

## 2018-05-07 DIAGNOSIS — E1122 Type 2 diabetes mellitus with diabetic chronic kidney disease: Secondary | ICD-10-CM | POA: Diagnosis not present

## 2018-05-07 DIAGNOSIS — Z7951 Long term (current) use of inhaled steroids: Secondary | ICD-10-CM | POA: Diagnosis not present

## 2018-05-07 DIAGNOSIS — M170 Bilateral primary osteoarthritis of knee: Secondary | ICD-10-CM | POA: Diagnosis not present

## 2018-05-07 DIAGNOSIS — J440 Chronic obstructive pulmonary disease with acute lower respiratory infection: Secondary | ICD-10-CM | POA: Diagnosis not present

## 2018-05-07 DIAGNOSIS — L853 Xerosis cutis: Secondary | ICD-10-CM | POA: Diagnosis not present

## 2018-05-07 DIAGNOSIS — M109 Gout, unspecified: Secondary | ICD-10-CM | POA: Diagnosis not present

## 2018-05-07 DIAGNOSIS — N183 Chronic kidney disease, stage 3 (moderate): Secondary | ICD-10-CM | POA: Diagnosis not present

## 2018-05-07 DIAGNOSIS — I471 Supraventricular tachycardia: Secondary | ICD-10-CM | POA: Diagnosis not present

## 2018-05-07 DIAGNOSIS — Z9981 Dependence on supplemental oxygen: Secondary | ICD-10-CM | POA: Diagnosis not present

## 2018-05-08 ENCOUNTER — Telehealth: Payer: Self-pay

## 2018-05-08 NOTE — Telephone Encounter (Signed)
Please re-send Levocetirizine as tablet instead of as liquid.  [name], RN [facility] RN)

## 2018-05-10 MED ORDER — LEVOCETIRIZINE DIHYDROCHLORIDE 5 MG PO TABS: ORAL_TABLET | ORAL | 0 refills | Status: DC

## 2018-05-10 NOTE — Telephone Encounter (Signed)
Changed levocetirizine to tablet instead of liquid.

## 2018-05-14 ENCOUNTER — Other Ambulatory Visit: Payer: Self-pay | Admitting: Family Medicine

## 2018-05-14 NOTE — Progress Notes (Signed)
Subjective:   Patient ID: [name]    DOB: [birth year], 85 y.o. adult   MRN: [id number]  [name] is a 85 y.o. adult with a history of CAP here for follow-up on Oxygen requirement.  Oxygen Requirement: Patient admitted on 04/17/2018 for CAP, initially requiring ICU stay for pressure control with multiple fluid boluses and BiPAP. Patient discharged on antibiotics and O2. Patient has been working with physical therapy and weaning O2 as tolerated. Currently wears oxygen all day, using 2L oxygen. Has attempted without O2 overnight x 2 nights and denies experiencing any symptoms. Today patient reports feeling great. Does not feel like he needs the oxygen anymore. Denies any SOB other than when walking up steps which is his baseline. Does trials of activity without oxygen and feels good. He notes he has chronic SOB with some activity at baseline because of his history of underlying pulmonary disease. Denies any chest pain, nausea, vomiting, lightheadedness/dizziness, diaphoresis. Mild cough.   Chronic obstructive pulmonary disease (HCC) Change from Advair to Breo 200/25 for formulary reasons. Also on Spiriva 18 mcg daily. Need refill of breo. Currently on home O2 secondary to pneumonia however feels ready to wean.  Seasonal Allergies: Patient notes need for refill of Xyzal for allergy symptoms. Notes he has been taking 5mg  BID with improvement in symptoms, although rx is written for 2.5mg  BID.  Hypomagnesemia: Low Mag level of 1.3 noted at last visit. Patient started on magnesium chloride, sustained release form.   Review of Systems:  Per HPI.   [place], medications and smoking status reviewed.  Objective:   BP 130/70   Pulse (!) 103   Temp 97.7 F (36.5 C) (Oral)   Wt 182 lb 2 oz (82.6 kg)   SpO2 94%   BMI 25.40 kg/m  Vitals and nursing note reviewed.  General: well nourished, well developed, in no acute distress with non-toxic appearance, sitting comfortably in exam  chair with wife beside him HEENT: normocephalic, atraumatic, moist mucous membranes Neck: supple, normal ROM CV: regular rate and rhythm without murmurs, rubs, or gallops, no lower extremity edema Lungs: clear to auscultation bilaterally with normal work of breathing Abdomen: soft, non-tender, non-distended, normoactive bowel sounds Extremities: warm and well perfused, normal tone MSK: ROM grossly intact, gait normal Neuro: Alert and oriented, speech normal  Ambulation with Pulse ox Results: 92-95% O2 sats while ambulating, HR: 95-115 BPM   Assessment & Plan:   Community acquired pneumonia of left lung Patient doing well since discharge. Patient no longer feels he needs oxygen and is ready to wean.  Ambulation with pulse ox WNL. Advised patient he can discontinue oxygen at this time.  Return precautions discussed at length.  Seasonal allergies Per patient request, refill of Xyzal sent to pharmacy Symptoms well managed with 5mg  dose BID, thus will continue at this time Discussed potential anticholinergic SEs given his age and return precautions Patient understood and agreed with plan to call clinic if begin to experience any adverse SE with higher dose of medication  Hypomagnesemia Patient only on PO Mag for ~1 week Patient to return in ~1 month to recheck mag level Magnesium (future order) placed and patient informed  Chronic obstructive pulmonary disease ([place]) Refill of Breo sent to pharmacy  Orders Placed This Encounter  Procedures  . Magnesium    Standing Status:   Future    Standing Expiration Date:   05/15/2019  . Check Pulse Oximetry while ambulating   Meds ordered this encounter  Medications  . fluticasone  furoate-vilanterol (BREO ELLIPTA) 200-25 MCG/INH AEPB    Sig: Inhale 1 puff into the lungs daily.    Dispense:  60 each    Refill:  0  . levocetirizine (XYZAL) 5 MG tablet    Sig: Take 1 tablet (5 mg total) by mouth 2 (two) times daily.    Dispense:  60 tablet      Refill:  2   Mina Marble, DO PGY-1, [place] [facility] 05/17/2018 7:24 PM

## 2018-05-15 ENCOUNTER — Encounter: Payer: Self-pay | Admitting: Family Medicine

## 2018-05-15 ENCOUNTER — Other Ambulatory Visit: Payer: Self-pay

## 2018-05-15 ENCOUNTER — Ambulatory Visit (INDEPENDENT_AMBULATORY_CARE_PROVIDER_SITE_OTHER): Payer: PPO | Admitting: Family Medicine

## 2018-05-15 DIAGNOSIS — J302 Other seasonal allergic rhinitis: Secondary | ICD-10-CM | POA: Diagnosis not present

## 2018-05-15 DIAGNOSIS — J189 Pneumonia, unspecified organism: Secondary | ICD-10-CM | POA: Diagnosis not present

## 2018-05-15 DIAGNOSIS — J449 Chronic obstructive pulmonary disease, unspecified: Secondary | ICD-10-CM | POA: Diagnosis not present

## 2018-05-15 MED ORDER — LEVOCETIRIZINE DIHYDROCHLORIDE 5 MG PO TABS: 5.0000 mg | ORAL_TABLET | Freq: Two times a day (BID) | ORAL | 2 refills | Status: DC

## 2018-05-15 MED ORDER — FLUTICASONE FUROATE-VILANTEROL 200-25 MCG/INH IN AEPB: 1.0000 | INHALATION_SPRAY | Freq: Every day | RESPIRATORY_TRACT | 0 refills | Status: DC

## 2018-05-15 NOTE — Patient Instructions (Signed)
Thank you for coming to see me today. It was a pleasure.   Today we ambulated you with a pulse ox and your oxygen levels were in good range. I do not think you need the oxygen any longer at this point. As we discussed, if you begin to feel increase shortness of breath with activity or while at rest, lightheaded or dizzy, please come back to be reevaluated.   I have refilled your prescriptions for Breo and Xyzal. They are at your pharmacy.   Please come in in ~1 month to have your magnesium level rechecked.  Please follow-up with your PCP as needed.  If you have any questions or concerns, please do not hesitate to call the office at [phone].  Take Care,  Dr. Mina Marble, DO Resident Physician [place] [phone]

## 2018-05-16 NOTE — Assessment & Plan Note (Addendum)
Patient doing well since discharge. Patient no longer feels he needs oxygen and is ready to wean.  Ambulation with pulse ox WNL. Advised patient he can discontinue oxygen at this time.  Return precautions discussed at length.

## 2018-05-17 DIAGNOSIS — J449 Chronic obstructive pulmonary disease, unspecified: Secondary | ICD-10-CM | POA: Diagnosis not present

## 2018-05-17 NOTE — Assessment & Plan Note (Addendum)
Per patient request, refill of Xyzal sent to pharmacy Symptoms well managed with 5mg  dose BID, thus will continue at this time Discussed potential anticholinergic SEs given his age and return precautions Patient understood and agreed with plan to call clinic if begin to experience any adverse SE with higher dose of medication

## 2018-05-17 NOTE — Assessment & Plan Note (Signed)
Patient only on PO Mag for ~1 week Patient to return in ~1 month to recheck mag level Magnesium (future order) placed and patient informed

## 2018-05-17 NOTE — Assessment & Plan Note (Addendum)
Refill of Breo sent to pharmacy

## 2018-05-19 ENCOUNTER — Other Ambulatory Visit: Payer: Self-pay | Admitting: Family Medicine

## 2018-05-24 ENCOUNTER — Other Ambulatory Visit: Payer: Self-pay | Admitting: Family Medicine

## 2018-05-24 DIAGNOSIS — M170 Bilateral primary osteoarthritis of knee: Secondary | ICD-10-CM

## 2018-06-08 ENCOUNTER — Other Ambulatory Visit: Payer: Self-pay | Admitting: Family Medicine

## 2018-06-08 DIAGNOSIS — J449 Chronic obstructive pulmonary disease, unspecified: Secondary | ICD-10-CM

## 2018-06-17 DIAGNOSIS — J449 Chronic obstructive pulmonary disease, unspecified: Secondary | ICD-10-CM | POA: Diagnosis not present

## 2018-06-25 ENCOUNTER — Other Ambulatory Visit: Payer: Self-pay | Admitting: *Deleted

## 2018-06-25 MED ORDER — MAGNESIUM CHLORIDE 64 MG PO TBEC: 1.0000 | DELAYED_RELEASE_TABLET | Freq: Every day | ORAL | 0 refills | Status: DC

## 2018-07-04 ENCOUNTER — Other Ambulatory Visit: Payer: Self-pay | Admitting: Family Medicine

## 2018-07-04 DIAGNOSIS — J449 Chronic obstructive pulmonary disease, unspecified: Secondary | ICD-10-CM

## 2018-07-11 ENCOUNTER — Other Ambulatory Visit: Payer: Self-pay | Admitting: Family Medicine

## 2018-07-11 DIAGNOSIS — M170 Bilateral primary osteoarthritis of knee: Secondary | ICD-10-CM

## 2018-07-17 DIAGNOSIS — J449 Chronic obstructive pulmonary disease, unspecified: Secondary | ICD-10-CM | POA: Diagnosis not present

## 2018-08-02 ENCOUNTER — Other Ambulatory Visit: Payer: Self-pay | Admitting: Family Medicine

## 2018-08-02 DIAGNOSIS — J449 Chronic obstructive pulmonary disease, unspecified: Secondary | ICD-10-CM

## 2018-08-11 ENCOUNTER — Other Ambulatory Visit: Payer: Self-pay | Admitting: Family Medicine

## 2018-08-11 DIAGNOSIS — J302 Other seasonal allergic rhinitis: Secondary | ICD-10-CM

## 2018-08-17 DIAGNOSIS — J449 Chronic obstructive pulmonary disease, unspecified: Secondary | ICD-10-CM | POA: Diagnosis not present

## 2018-08-30 ENCOUNTER — Other Ambulatory Visit: Payer: Self-pay | Admitting: Family Medicine

## 2018-08-30 DIAGNOSIS — M170 Bilateral primary osteoarthritis of knee: Secondary | ICD-10-CM

## 2018-09-16 DIAGNOSIS — J449 Chronic obstructive pulmonary disease, unspecified: Secondary | ICD-10-CM | POA: Diagnosis not present

## 2018-09-17 ENCOUNTER — Other Ambulatory Visit: Payer: Self-pay

## 2018-09-17 ENCOUNTER — Ambulatory Visit (INDEPENDENT_AMBULATORY_CARE_PROVIDER_SITE_OTHER): Payer: PPO | Admitting: Family Medicine

## 2018-09-17 VITALS — BP 140/80 | HR 90

## 2018-09-17 DIAGNOSIS — L97511 Non-pressure chronic ulcer of other part of right foot limited to breakdown of skin: Secondary | ICD-10-CM

## 2018-09-17 DIAGNOSIS — E876 Hypokalemia: Secondary | ICD-10-CM | POA: Diagnosis not present

## 2018-09-17 MED ORDER — DOXYCYCLINE HYCLATE 100 MG PO TABS: 100.0000 mg | ORAL_TABLET | Freq: Two times a day (BID) | ORAL | 0 refills | Status: DC

## 2018-09-17 NOTE — Patient Instructions (Signed)
It was great seeing you again today!  I am sorry you had so much trouble with your toe.  I think it is due to a skin infection called cellulitis.  For this we will give you an antibiotic called doxycycline.  Take this 2 times per day for 7 days.  I would like to see you back in around 1 week for a wound check.  I think that black area might be dead skin need to check and see if this is healing well.  We also go to get a magnesium and potassium level today.  I will call you on Wednesday with these results when they come back.  They will also give you a chance to check on your wound via the phone.

## 2018-09-18 LAB — BASIC METABOLIC PANEL
BUN/Creatinine Ratio: 13 (ref 10–24)
BUN: 19 mg/dL (ref 8–27)
CO2: 20 mmol/L (ref 20–29)
Calcium: 9.8 mg/dL (ref 8.6–10.2)
Chloride: 109 mmol/L — ABNORMAL HIGH (ref 96–106)
Creatinine, Ser: 1.49 mg/dL — ABNORMAL HIGH (ref 0.76–1.27)
GFR calc Af Amer: 49 mL/min/{1.73_m2} — ABNORMAL LOW (ref >59)
GFR calc non Af Amer: 43 mL/min/{1.73_m2} — ABNORMAL LOW (ref >59)
Glucose: 124 mg/dL — ABNORMAL HIGH (ref 65–99)
Potassium: 5.5 mmol/L — ABNORMAL HIGH (ref 3.5–5.2)
Sodium: 145 mmol/L — ABNORMAL HIGH (ref 134–144)

## 2018-09-18 LAB — MAGNESIUM: Magnesium: 1.4 mg/dL — ABNORMAL LOW (ref 1.6–2.3)

## 2018-09-19 ENCOUNTER — Telehealth: Payer: Self-pay | Admitting: Family Medicine

## 2018-09-19 NOTE — Telephone Encounter (Signed)
Called patient to check on status of toe. Patient states that the black area at distal point of his nail bed is intermittently throbbing, but is not hurting all of the time. He states that he feels like overall the wound has slightly improved. Offered patient follow up in clinic tomorrow. If the pain worsens he will see me tomorrow as a same day in clinic or an add on. For now his pain is well controlled with tylenol and elevation.  Also discussed his cmp and mg level. His kidney function is stable. His k is elevated so I advised him to stop taking potassium.  Guadalupe Dawn MD PGY-3 Family Medicine Resident

## 2018-09-20 ENCOUNTER — Encounter: Payer: Self-pay | Admitting: Family Medicine

## 2018-09-20 DIAGNOSIS — L97509 Non-pressure chronic ulcer of other part of unspecified foot with unspecified severity: Secondary | ICD-10-CM | POA: Insufficient documentation

## 2018-09-20 HISTORY — DX: Non-pressure chronic ulcer of other part of unspecified foot with unspecified severity: L97.509

## 2018-09-20 NOTE — Assessment & Plan Note (Signed)
K5.5 on CMP.  Advised patient to stop taking the potassium supplement.

## 2018-09-20 NOTE — Assessment & Plan Note (Signed)
Patient's toe ulcer felt to be consistent with poorly treated cellulitis.  The distribution is concerning for vascular etiology although the pattern does not appear to be limited to any particular digital vessel.  The dorsal aspect of the toe is well vascularized, but the most concerning area is the area of black eschar under the toenail.  We will treat this like a cellulitis, patient is diabetic so we will treat for Pseudomonas.  Doxycycline 100 mg twice daily for 7 days.  Patient does not show notable improvement will likely need to get arterial Doppler with toe pressures to rule out vascular etiology.  He will follow-up with me on 7/20 in person inspection.  Also follow-up with patient by phone every 2 to 3 days to check progress.  Will likely need long-term wound care follow-up. -Doxycycline 1 mg twice daily for 7 days -In person recheck of wound on 09/24/2018. -Follow-up patient by phone number to 3 days check progress -Gave return precautions and emergency precautions to patient -We will consider vascular studies if we does not show some improvement at next visit.

## 2018-09-20 NOTE — Progress Notes (Signed)
HPI 85 year old male who presents for toe ulcer. Patient is developed pain, skin breakdown, and black eschar formation on his right great toe.  Patient states that he thinks it all started around a week ago when he "cut his toenails too close".  He states that it was quite painful initially and this is slowly gotten better.  The darkening skin has gotten progressively worse.  He thinks this might represent a "blood blister" because he thinks he stubbed it on a doorway as well.  There is also an area of ulceration more proximal to the toenail bed.  This area is not painful, has a well vascularized base.  Patient has tried soaking his toe and alcohol, Epsom salt, peroxide.  This did not provide any benefit for him.  Discussed toe with patient on the phone 2 days after clinic visit.  Stated that he feels like he is doing a little better and is looking little better but has developed a throbbing tenderness to it.  CC: Toe wound  ROS:   Review of Systems See HPI for ROS.   CC, SH/smoking status, and VS noted  Objective: BP 140/80   Pulse 90   SpO2 96%  Gen: Very pleasant 85 year old African-American male, no acute distress, resting comfortably CV: RRR, no murmur Resp: CTAB, no wheezes, non-labored Abd: SNTND, BS present, no guarding or organomegaly Neuro: Alert and oriented, Speech clear, No gross deficits  Right great toe Inspection:      Area of obvious ulceration proximal to toenail bed.  Good vascular base.  Area of what appears to be black eschar underneath toenail.   Palpation: Black eschar-like base, exquisitely tender on palpation.  Otherwise no tenderness palpation toe.  Area is warm and dry, but does not feel outside of normal temperature. Range of motion: Fully intact Strength: 5 out of 5 strength Neurovascular: Patient does have palpable PT DP on this foot.  Sensation intact   Assessment and plan:  Toe ulcer ([place]) Patient's toe ulcer felt to be consistent with  poorly treated cellulitis.  The distribution is concerning for vascular etiology although the pattern does not appear to be limited to any particular digital vessel.  The dorsal aspect of the toe is well vascularized, but the most concerning area is the area of black eschar under the toenail.  We will treat this like a cellulitis, patient is diabetic so we will treat for Pseudomonas.  Doxycycline 100 mg twice daily for 7 days.  Patient does not show notable improvement will likely need to get arterial Doppler with toe pressures to rule out vascular etiology.  He will follow-up with me on 7/20 in person inspection.  Also follow-up with patient by phone every 2 to 3 days to check progress.  Will likely need long-term wound care follow-up. -Doxycycline 1 mg twice daily for 7 days -In person recheck of wound on 09/24/2018. -Follow-up patient by phone number to 3 days check progress -Gave return precautions and emergency precautions to patient -We will consider vascular studies if we does not show some improvement at next visit.   Orders Placed This Encounter  Procedures  . Basic Metabolic Panel  . Magnesium    Meds ordered this encounter  Medications  . doxycycline (VIBRA-TABS) 100 MG tablet    Sig: Take 1 tablet (100 mg total) by mouth 2 (two) times daily.    Dispense:  14 tablet    Refill:  0    Guadalupe Dawn MD PGY-3 Family Medicine Resident  09/20/2018 11:05 AM

## 2018-09-20 NOTE — Assessment & Plan Note (Signed)
Magnesium 1.4, up slightly from 1.3.  Recommended patient continue taking Slow-Mag 64 mg daily.

## 2018-09-24 ENCOUNTER — Ambulatory Visit (INDEPENDENT_AMBULATORY_CARE_PROVIDER_SITE_OTHER): Payer: PPO | Admitting: Family Medicine

## 2018-09-24 ENCOUNTER — Other Ambulatory Visit: Payer: Self-pay

## 2018-09-24 VITALS — BP 130/80 | HR 121

## 2018-09-24 DIAGNOSIS — I998 Other disorder of circulatory system: Secondary | ICD-10-CM | POA: Diagnosis not present

## 2018-09-24 DIAGNOSIS — L97511 Non-pressure chronic ulcer of other part of right foot limited to breakdown of skin: Secondary | ICD-10-CM | POA: Diagnosis not present

## 2018-09-24 NOTE — Patient Instructions (Signed)
It was great seeing you again today!  After getting some more history and looking at your toe again I would like to make sure we do not have a blood flow issue to your toe.  Best way to do this is to get ultrasound of your toe, and all the blood vessels in your legs.  I think it will take a few weeks for this wound to heal up.  For that I like for you to see a wound care center a few times per week to make sure this heals up well.  I will likely call you in a couple days to see how things are going.  I want to continue to be very aggressive in treating this to make sure you keep your toe.

## 2018-09-25 ENCOUNTER — Ambulatory Visit (HOSPITAL_COMMUNITY)
Admission: RE | Admit: 2018-09-25 | Discharge: 2018-09-25 | Disposition: A | Discharge status: Home / Self Care | Payer: PPO | Source: Ambulatory Visit | Attending: Family Medicine | Admitting: Family Medicine

## 2018-09-25 ENCOUNTER — Ambulatory Visit (HOSPITAL_BASED_OUTPATIENT_CLINIC_OR_DEPARTMENT_OTHER)
Admission: RE | Admit: 2018-09-25 | Discharge: 2018-09-25 | Disposition: A | Discharge status: Home / Self Care | Payer: PPO | Source: Ambulatory Visit | Attending: Family Medicine | Admitting: Family Medicine

## 2018-09-25 DIAGNOSIS — I998 Other disorder of circulatory system: Secondary | ICD-10-CM | POA: Diagnosis not present

## 2018-09-26 ENCOUNTER — Telehealth: Payer: Self-pay | Admitting: Family Medicine

## 2018-09-26 ENCOUNTER — Encounter: Payer: Self-pay | Admitting: Family Medicine

## 2018-09-26 NOTE — Assessment & Plan Note (Addendum)
Upon reexamination, coupled with new history, and status post antibiotics the toe wound does not appear to have improved.  Sent patient for arterial Doppler and ABIs.  Right leg ABI 0.5.  There appears to be occlusion of peroneal, popliteal, ATA, PTA.  The PTA reconstitutes distal lower extremity, which could explain the palpable pulse in this area at last 2 visits.  Will send patient for urgent evaluation by vascular surgery.  Given that patient has been on for 3 weeks now, he does not have signs or symptoms of critical digit ischemia do not think an urgent presentation to the emergency department is warranted. -Urgent evaluation by vascular surgery referral placed -We will go ahead and place referral to wound care center

## 2018-09-26 NOTE — Progress Notes (Addendum)
   HPI 85 year old male who presents for reevaluation of right great toe wound.  Patient very well-known to me, last seen on 09/17/2018 for the same issue.  At that appointment patient was found to have chronic right toe wound which started approximately 7 to 10 days previously by patient account.  States that he "cut his toenails too close" and after that he developed darkening of the skin under the toenail and an ulceration proximal to toenail bed this toe.  He thinks that the darkening the skin under his toenail started because he had stubbed his toe thought he had "blood blister".  There was mild erythema of surrounding toe.  Toe and distal foot warm and dry, and posterior tibial artery was palpated.  This was felt to be due to cellulitis patient was started on doxycycline.  On presentation patient feels that the swelling has gone down some, but the wound has not changed much.  Patient now feels this time that he is not sure how his toe pain started not sure how the ulceration started.  CC: Toe wound  ROS:   Review of Systems See HPI for ROS.   CC, SH/smoking status, and VS noted  Objective: BP 130/80   Pulse (!) 121   SpO2 24%  Gen: 85 year old African-American male, no acute distress, resting comfortably CV: Tachycardic, no M/R/G.  Posterior tibialis is again palpated, nonpalpable DP. Resp: CTAB, no wheezes, non-labored Abd: SNTND, BS present, no guarding or organomegaly Ext: No edema, warm Neuro: Alert and oriented, Speech clear, No gross deficits  Right great toe Inspection the toe has less swelling and erythema.  The wound proximal to the nailbed has much less of a vascularized appearance.  It is nonpainful.  Black eschar-looking area underneath the toenail is less painful than previous.         Assessment and plan:  Toe ulcer ([place]) Upon reexamination, coupled with new history, and status post antibiotics the toe wound does not appear to have improved.  Sent patient for  arterial Doppler and ABIs.  Right leg ABI 0.5.  There appears to be occlusion of peroneal, popliteal, ATA, PTA.  The PTA reconstitutes distal lower extremity, which could explain the palpable pulse in this area at last 2 visits.  Will send patient for urgent evaluation by vascular surgery.  Given that patient has been on for 3 weeks now, he does not have signs or symptoms of critical digit ischemia do not think an urgent presentation to the emergency department is warranted. -Urgent evaluation by vascular surgery referral placed -We will go ahead and place referral to wound care center   Orders Placed This Encounter  Procedures  . AMB referral to wound care center    Referral Priority:   Routine    Referral Type:   Consultation    Number of Visits Requested:   1  . Ambulatory referral to Vascular Surgery    Referral Priority:   Urgent    Referral Type:   Surgical    Referral Reason:   Specialty Services Required    Requested Specialty:   Vascular Surgery    Number of Visits Requested:   1    No orders of the defined types were placed in this encounter.  Guadalupe Dawn MD PGY-3 Family Medicine Resident  09/26/2018 9:43 AM

## 2018-09-26 NOTE — Telephone Encounter (Signed)
Had a very long discussion with patient regarding his recent arterial duplex results.  Explained that he has occlusion in the popliteal, anterior tibialis, posterior tibialis arteries and that his foot is likely only being fed by collateral blood flow at this point.  After mentioning this he states that his foot has "been cold at night a lot recently" and that he has forgotten this during his most recent clinic appointments.  Explained that I placed an urgent referral to vascular surgery, and that I would defer to their judgment regarding his options for returning blood flow to his foot.  Patient states that he is having intermittent mild pain in the foot that is relieved with Tylenol.  Patient very appreciative of call and update of results.  Again do not feel the patient's ischemia is significant enough for direct presentation to emergency department, but will need specialist evaluation in the very near future.  Guadalupe Dawn MD PGY-3 Family Medicine Resident

## 2018-09-27 ENCOUNTER — Telehealth: Payer: Self-pay | Admitting: *Deleted

## 2018-09-27 ENCOUNTER — Encounter: Payer: Self-pay | Admitting: Family Medicine

## 2018-09-27 NOTE — Telephone Encounter (Signed)
Opened in error. Christen Bame, CMA

## 2018-09-27 NOTE — Telephone Encounter (Signed)
Community message sent to [employer] @ [employer].  Will fax if they are unable to print and process.  Christen Bame, CMA

## 2018-09-27 NOTE — Telephone Encounter (Signed)
Caryl Pina calls because she received a call from patient about his bill.   Pt was advised to discontinue O2 in March.  Evidentially, he told [employer] but we were supposed to send a letter with this info.  This does not seem to have happened.   Will forward to Dr. Tarry Kos ( saw pt in March) to formulate letter and I will fax to [place] @ [phone].  Please include date that pt was told to d/c o2. Christen Bame, CMA

## 2018-09-27 NOTE — Telephone Encounter (Signed)
Thank you for this information. I have sent a letter to Christen Bame to be faxes to [employer]. Let me know if there is anything else I can do.

## 2018-09-28 NOTE — Telephone Encounter (Signed)
Letter faxed to [employer] @ [phone] and [phone]. Christen Bame, CMA

## 2018-10-01 ENCOUNTER — Telehealth (HOSPITAL_COMMUNITY): Payer: Self-pay | Admitting: Rehabilitation

## 2018-10-01 NOTE — Telephone Encounter (Signed)

## 2018-10-02 ENCOUNTER — Encounter: Payer: Self-pay | Admitting: Vascular Surgery

## 2018-10-02 ENCOUNTER — Ambulatory Visit (INDEPENDENT_AMBULATORY_CARE_PROVIDER_SITE_OTHER): Payer: PPO | Admitting: Vascular Surgery

## 2018-10-02 ENCOUNTER — Other Ambulatory Visit: Payer: Self-pay

## 2018-10-02 ENCOUNTER — Other Ambulatory Visit: Payer: Self-pay | Admitting: *Deleted

## 2018-10-02 ENCOUNTER — Other Ambulatory Visit (HOSPITAL_COMMUNITY)
Admission: RE | Admit: 2018-10-02 | Discharge: 2018-10-02 | Disposition: A | Discharge status: Home / Self Care | Payer: PPO | Source: Ambulatory Visit | Attending: Vascular Surgery | Admitting: Vascular Surgery

## 2018-10-02 ENCOUNTER — Encounter: Payer: Self-pay | Admitting: *Deleted

## 2018-10-02 DIAGNOSIS — I998 Other disorder of circulatory system: Secondary | ICD-10-CM | POA: Diagnosis not present

## 2018-10-02 DIAGNOSIS — I70229 Atherosclerosis of native arteries of extremities with rest pain, unspecified extremity: Secondary | ICD-10-CM

## 2018-10-02 DIAGNOSIS — Z20828 Contact with and (suspected) exposure to other viral communicable diseases: Secondary | ICD-10-CM | POA: Diagnosis not present

## 2018-10-02 HISTORY — DX: Atherosclerosis of native arteries of extremities with rest pain, unspecified extremity: I70.229

## 2018-10-02 NOTE — Progress Notes (Signed)
Patient name: Jawaan Adachi MRN: [id number] DOB: 15-Sep-1933 Sex: adult  REASON FOR CONSULT: ischemia of right great toe  HPI: Mandeep Kiser is a 85 y.o. adult, with history of arthritis, COPD, diabetes, hypertension, and CKD that presents for evaluation of ischemia to his right great toe.  Per the patient's wife he developed a wound on his great toe approximately 1-1/2 weeks ago.  This has since been nonhealing and has been progressing.  Patient also states he is having some pain in the foot and the toes.  He also describes some pain in the left foot as well consistent with rest pain.  He denies any previous lower extremity interventions.  No previous bypasses.  He states he does not smoke.  He has been followed by Dr. Kris Mouton as his primary care provider who referred him to vascular surgery.  ABI's prior to referral were 0.51 monophasic on right and 0.94 triphasic on left.  Past Medical History:  Diagnosis Date  . ANEMIA, IRON DEF, NOS 01/15/2007   Qualifier: Diagnosis of  By: Jeannine Kitten MD, Rodman Key    . Arthritis   . Cataract extraction status of left eye 2016  . Cataract extraction status of right eye 2016  . CHOLECYSTECTOMY, LAPAROSCOPIC, HX OF 04/12/2007   Qualifier: Diagnosis of  By: Netty Starring  MD, Lucianne Muss    . Diabetes mellitus without complication ([place])   . Gout 2007  . Hypertension     Past Surgical History:  Procedure Laterality Date  . CHOLECYSTECTOMY      Family History  Problem Relation Age of Onset  . Asthma Mother   . Heart disease Brother   . Stroke Brother   . Diabetes Brother   . Cancer Brother        colon  . Arthritis Sister   . Arthritis Daughter   . Stroke Brother   . Heart disease Brother   . Alcohol abuse Sister   . Cirrhosis Sister   . Asthma Sister   . Diabetes Son   . Asthma Daughter     SOCIAL HISTORY: Social History   Socioeconomic History  . Marital status: Married    Spouse name: Enid Derry  . Number of children: 9  . Years of  education: 10  . Highest education level: 10th grade  Occupational History  . Occupation: RetiredAnimal nutritionist Work  Social Needs  . Financial resource strain: Somewhat hard  . Food insecurity    Worry: Sometimes true    Inability: Sometimes true  . Transportation needs    Medical: No    Non-medical: No  Tobacco Use  . Smoking status: Former Smoker    Packs/day: 0.50    Years: 60.00    Pack years: 30.00    Types: Cigars, Cigarettes    Start date: 03/08/1951    Quit date: 06/08/2011    Years since quitting: 7.3  . Smokeless tobacco: Never Used  . Tobacco comment: no plans to start  Substance and Sexual Activity  . Alcohol use: No  . Drug use: No  . Sexual activity: Not Currently  Lifestyle  . Physical activity    Days per week: 0 days    Minutes per session: 0 min  . Stress: Not at all  Relationships  . Social connections    Talks on phone: More than three times a week    Gets together: Three times a week    Attends religious service: Never    Active member of club or organization: No  Attends meetings of clubs or organizations: Never    Relationship status: Married  . Intimate partner violence    Fear of current or ex partner: No    Emotionally abused: No    Physically abused: No    Forced sexual activity: No  Other Topics Concern  . Not on file  Social History Narrative   Health Care POA:    Emergency Contact: wife, Robert Sperl, [phone]   End of Life Plan:    Who lives with you: wife, step daughter, granddaughter . House has two levels, has trouble going up and down stairs due to knee pain and foot pain      Any pets: none   Diet: Patient has a varied diet and reports eating bacon and eggs every morning. Does not like most fruit. Likes flavored water, kool-aid.   No current exercise.     Seatbelts: Pt reports wearing seatbelt when in vehicle.   Hobbies: likes to watch [employer]                No Known Allergies  Current Outpatient Medications   Medication Sig Dispense Refill  . allopurinol (ZYLOPRIM) 300 MG tablet TAKE 1 TABLET BY MOUTH EVERY DAY 90 tablet 2  . BREO ELLIPTA 200-25 MCG/INH AEPB INHALE 1 PUFF BY MOUTH EVERY DAY 60 each 3  . diclofenac sodium (VOLTAREN) 1 % GEL APPLY 2 GRAMS TO AFFECTED AREA 4 TIMES A DAY 400 g 0  . levocetirizine (XYZAL) 5 MG tablet TAKE 1 TABLET BY MOUTH TWICE A DAY 180 tablet 0  . tiotropium (SPIRIVA) 18 MCG inhalation capsule INHALE 1 CAPSULE VIA HANDIHALER ONCE DAILY AT THE SAME TIME EVERY DAY 90 capsule 2   No current facility-administered medications for this visit.     REVIEW OF SYSTEMS:  [X]  denotes positive finding, [ ]  denotes negative finding Cardiac  Comments:  Chest pain or chest pressure:    Shortness of breath upon exertion:    Short of breath when lying flat:    Irregular heart rhythm:        Vascular    Pain in calf, thigh, or hip brought on by ambulation:    Pain in feet at night that wakes you up from your sleep:     Blood clot in your veins:    Leg swelling:         Pulmonary    Oxygen at home:    Productive cough:     Wheezing:     COPD x   Neurologic    Sudden weakness in arms or legs:     Sudden numbness in arms or legs:     Sudden onset of difficulty speaking or slurred speech:    Temporary loss of vision in one eye:     Problems with dizziness:         Gastrointestinal    Blood in stool:     Vomited blood:         Genitourinary    Burning when urinating:     Blood in urine:        Psychiatric    Major depression:         Hematologic    Bleeding problems:    Problems with blood clotting too easily:        Skin    Rashes or ulcers:        Constitutional    Fever or chills:      PHYSICAL EXAM: Vitals:   10/02/18  1322  BP: (!) 146/86  Pulse: 92  Resp: 16  Temp: (!) 97.5 F (36.4 C)  TempSrc: Oral  SpO2: 95%  Weight: 180 lb (81.6 kg)  Height: 5\' 11"  (1.803 m)    GENERAL: The patient is a well-nourished adult, in no acute distress.  The vital signs are documented above. CARDIAC: There is a regular rate and rhythm.  VASCULAR:  2+ radial pulse palpable bilaterally 2+ femoral pulses palpable bilaterally No palpable pedal pulses Right DP/PT signals monophasic Right great toe gangrene PULMONARY: There is good air exchange bilaterally without wheezing or rales. ABDOMEN: Soft and non-tender with normal pitched bowel sounds.  MUSCULOSKELETAL: There are no major deformities or cyanosis. NEUROLOGIC: No focal weakness or paresthesias are detected. SKIN: There are no ulcers or rashes noted. PSYCHIATRIC: The patient has a normal affect.  DATA:   I independently reviewed his noninvasive imaging and he has an ABI of 0.51 on the right with monophasic waveform 0.94 on the left with a triphasic waveform.  Lower extremity arterial duplex revealed total occlusion of the right popliteal artery as well as the anterior tibial - the peroneal and posterior tibial arteries do reconstitute distally.  Assessment/Plan:  85 year old male that presents with critical limb ischemia of the right lower extremity with tissue loss.  He has a gangrenous nonhealing right great toe with monophasic waveform at the ankle and evidence of an occluded popliteal artery with distal reconstitution of a peroneal and posterior tibial on duplex.  I recommended aortogram with lower extremity arteriogram and possible intervention.  I discussed that he likely will require bypass but will need to do arteriogram to see if he has a suitable target.  Risk and benefits were discussed with the patient and his wife.   Marty Heck, MD [facility] Office: [phone] Pager: [phone]

## 2018-10-03 ENCOUNTER — Other Ambulatory Visit: Payer: Self-pay

## 2018-10-03 ENCOUNTER — Encounter (HOSPITAL_COMMUNITY)
Admission: RE | Disposition: A | Discharge status: Home / Self Care | Payer: Self-pay | Source: Home / Self Care | Attending: Vascular Surgery

## 2018-10-03 ENCOUNTER — Ambulatory Visit (HOSPITAL_COMMUNITY)
Admission: RE | Admit: 2018-10-03 | Discharge: 2018-10-03 | Disposition: A | Discharge status: Home / Self Care | Payer: PPO | Attending: Vascular Surgery | Admitting: Vascular Surgery

## 2018-10-03 DIAGNOSIS — E11621 Type 2 diabetes mellitus with foot ulcer: Secondary | ICD-10-CM | POA: Diagnosis not present

## 2018-10-03 DIAGNOSIS — I129 Hypertensive chronic kidney disease with stage 1 through stage 4 chronic kidney disease, or unspecified chronic kidney disease: Secondary | ICD-10-CM | POA: Insufficient documentation

## 2018-10-03 DIAGNOSIS — E1122 Type 2 diabetes mellitus with diabetic chronic kidney disease: Secondary | ICD-10-CM | POA: Diagnosis not present

## 2018-10-03 DIAGNOSIS — Z7951 Long term (current) use of inhaled steroids: Secondary | ICD-10-CM | POA: Insufficient documentation

## 2018-10-03 DIAGNOSIS — L97519 Non-pressure chronic ulcer of other part of right foot with unspecified severity: Secondary | ICD-10-CM | POA: Diagnosis not present

## 2018-10-03 DIAGNOSIS — M1990 Unspecified osteoarthritis, unspecified site: Secondary | ICD-10-CM | POA: Diagnosis not present

## 2018-10-03 DIAGNOSIS — J449 Chronic obstructive pulmonary disease, unspecified: Secondary | ICD-10-CM | POA: Diagnosis not present

## 2018-10-03 DIAGNOSIS — M109 Gout, unspecified: Secondary | ICD-10-CM | POA: Insufficient documentation

## 2018-10-03 DIAGNOSIS — Z833 Family history of diabetes mellitus: Secondary | ICD-10-CM | POA: Diagnosis not present

## 2018-10-03 DIAGNOSIS — N189 Chronic kidney disease, unspecified: Secondary | ICD-10-CM | POA: Diagnosis not present

## 2018-10-03 DIAGNOSIS — Z8249 Family history of ischemic heart disease and other diseases of the circulatory system: Secondary | ICD-10-CM | POA: Diagnosis not present

## 2018-10-03 DIAGNOSIS — Z87891 Personal history of nicotine dependence: Secondary | ICD-10-CM | POA: Insufficient documentation

## 2018-10-03 DIAGNOSIS — I998 Other disorder of circulatory system: Secondary | ICD-10-CM | POA: Diagnosis not present

## 2018-10-03 DIAGNOSIS — E1152 Type 2 diabetes mellitus with diabetic peripheral angiopathy with gangrene: Secondary | ICD-10-CM | POA: Diagnosis not present

## 2018-10-03 DIAGNOSIS — Z823 Family history of stroke: Secondary | ICD-10-CM | POA: Insufficient documentation

## 2018-10-03 DIAGNOSIS — I70261 Atherosclerosis of native arteries of extremities with gangrene, right leg: Secondary | ICD-10-CM | POA: Insufficient documentation

## 2018-10-03 HISTORY — PX: ABDOMINAL AORTOGRAM W/LOWER EXTREMITY: CATH118223

## 2018-10-03 LAB — POCT I-STAT, CHEM 8
BUN: 25 mg/dL — ABNORMAL HIGH (ref 8–23)
Calcium, Ion: 1.35 mmol/L (ref 1.15–1.40)
Chloride: 107 mmol/L (ref 98–111)
Creatinine, Ser: 1.3 mg/dL — ABNORMAL HIGH (ref 0.61–1.24)
Glucose, Bld: 156 mg/dL — ABNORMAL HIGH (ref 70–99)
HCT: 43 % (ref 39.0–52.0)
Hemoglobin: 14.6 g/dL (ref 13.0–17.0)
Potassium: 4 mmol/L (ref 3.5–5.1)
Sodium: 142 mmol/L (ref 135–145)
TCO2: 22 mmol/L (ref 22–32)

## 2018-10-03 LAB — SARS CORONAVIRUS 2 (TAT 6-24 HRS): SARS Coronavirus 2: NEGATIVE

## 2018-10-03 SURGERY — ABDOMINAL AORTOGRAM W/LOWER EXTREMITY
Anesthesia: LOCAL

## 2018-10-03 MED ORDER — HEPARIN (PORCINE) IN NACL 1000-0.9 UT/500ML-% IV SOLN
INTRAVENOUS | Status: DC | PRN
  Administered 2018-10-03: 500 mL

## 2018-10-03 MED ORDER — ONDANSETRON HCL 4 MG/2ML IJ SOLN: 4.0000 mg | Freq: Four times a day (QID) | INTRAMUSCULAR | Status: DC | PRN

## 2018-10-03 MED ORDER — FENTANYL CITRATE (PF) 100 MCG/2ML IJ SOLN
INTRAMUSCULAR | Status: AC
  Filled 2018-10-03: qty 2

## 2018-10-03 MED ORDER — LIDOCAINE HCL (PF) 1 % IJ SOLN
INTRAMUSCULAR | Status: AC
  Filled 2018-10-03: qty 30

## 2018-10-03 MED ORDER — SODIUM CHLORIDE 0.9 % IV SOLN: 250.0000 mL | INTRAVENOUS | Status: DC | PRN

## 2018-10-03 MED ORDER — SODIUM CHLORIDE 0.9 % WEIGHT BASED INFUSION: 1.0000 mL/kg/h | INTRAVENOUS | Status: DC

## 2018-10-03 MED ORDER — SODIUM CHLORIDE 0.9 % IV SOLN
INTRAVENOUS | Status: DC
  Administered 2018-10-03: 11:00:00 via INTRAVENOUS

## 2018-10-03 MED ORDER — ACETAMINOPHEN 325 MG PO TABS: 650.0000 mg | ORAL_TABLET | ORAL | Status: DC | PRN

## 2018-10-03 MED ORDER — FENTANYL CITRATE (PF) 100 MCG/2ML IJ SOLN
INTRAMUSCULAR | Status: DC | PRN
  Administered 2018-10-03: 25 ug via INTRAVENOUS

## 2018-10-03 MED ORDER — LIDOCAINE HCL (PF) 1 % IJ SOLN
INTRAMUSCULAR | Status: DC | PRN
  Administered 2018-10-03: 15 mL

## 2018-10-03 MED ORDER — MIDAZOLAM HCL 2 MG/2ML IJ SOLN
INTRAMUSCULAR | Status: AC
  Filled 2018-10-03: qty 2

## 2018-10-03 MED ORDER — HYDRALAZINE HCL 20 MG/ML IJ SOLN: 5.0000 mg | INTRAMUSCULAR | Status: DC | PRN

## 2018-10-03 MED ORDER — LABETALOL HCL 5 MG/ML IV SOLN: 10.0000 mg | INTRAVENOUS | Status: DC | PRN

## 2018-10-03 MED ORDER — SODIUM CHLORIDE 0.9% FLUSH: 3.0000 mL | Freq: Two times a day (BID) | INTRAVENOUS | Status: DC

## 2018-10-03 MED ORDER — SODIUM CHLORIDE 0.9% FLUSH: 3.0000 mL | INTRAVENOUS | Status: DC | PRN

## 2018-10-03 MED ORDER — IODIXANOL 320 MG/ML IV SOLN
INTRAVENOUS | Status: DC | PRN
  Administered 2018-10-03: 140 mL via INTRA_ARTERIAL

## 2018-10-03 MED ORDER — HEPARIN (PORCINE) IN NACL 1000-0.9 UT/500ML-% IV SOLN
INTRAVENOUS | Status: AC
  Filled 2018-10-03: qty 1000

## 2018-10-03 MED ORDER — MIDAZOLAM HCL 2 MG/2ML IJ SOLN
INTRAMUSCULAR | Status: DC | PRN
  Administered 2018-10-03: 1 mg via INTRAVENOUS

## 2018-10-03 SURGICAL SUPPLY — 14 items
CATH CROSS OVER TEMPO 5F (CATHETERS) ×2 IMPLANT
CATH OMNI FLUSH 5F 65CM (CATHETERS) ×2 IMPLANT
CLOSURE MYNX CONTROL 5F (Vascular Products) ×2 IMPLANT
DEVICE TORQUE .025-.038 (MISCELLANEOUS) ×2 IMPLANT
GUIDEWIRE ANGLED .035X150CM (WIRE) ×2 IMPLANT
KIT MICROPUNCTURE NIT STIFF (SHEATH) ×2 IMPLANT
KIT PV (KITS) ×2 IMPLANT
SHEATH PINNACLE 5F 10CM (SHEATH) ×2 IMPLANT
SHEATH PROBE COVER 6X72 (BAG) ×2 IMPLANT
SYR MEDRAD MARK V 150ML (SYRINGE) ×2 IMPLANT
TRANSDUCER W/STOPCOCK (MISCELLANEOUS) ×2 IMPLANT
TRAY PV CATH (CUSTOM PROCEDURE TRAY) ×2 IMPLANT
WIRE BENTSON .035X145CM (WIRE) ×2 IMPLANT
WIRE TORQFLEX AUST .018X40CM (WIRE) ×2 IMPLANT

## 2018-10-03 NOTE — Discharge Instructions (Signed)
° °  DRINK PLENTY OF FLUIDS FOR THE NEXT 2-3 DAYS TO KEEP HYDRATED.  Femoral Site Care This sheet gives you information about how to care for yourself after your procedure. Your health care provider may also give you more specific instructions. If you have problems or questions, contact your health care provider. What can I expect after the procedure? After the procedure, it is common to have:  Bruising that usually fades within 1-2 weeks.  Tenderness at the site. Follow these instructions at home: Wound care  Follow instructions from your health care provider about how to take care of your insertion site. Make sure you: ? Wash your hands with soap and water before you change your bandage (dressing). If soap and water are not available, use hand sanitizer. ? Change your dressing as told by your health care provider.  Do not take baths, swim, or use a hot tub for 5 days.  You may shower 24-48 hours after the procedure or as told by your health care provider. ? Gently wash the site with plain soap and water. ? Pat the area dry with a clean towel. ? Do not rub the site. This may cause bleeding.  Do not apply powder or lotion to the site. Keep the site clean and dry.  Check your femoral site every day for signs of infection. Check for: ? Redness, swelling, or pain. ? Fluid or blood. ? Warmth. ? Pus or a bad smell. Activity  For the first 2-3 days after your procedure, or as long as directed: ? Avoid climbing stairs as much as possible. ? Do not squat.  Do not lift anything that is heavier than 10 lb (4.5 kg) for 5 days.  Rest as directed. ? Avoid sitting for a long time without moving. Get up to take short walks every 1-2 hours.  Do not drive for 24 hours if you were given a medicine to help you relax (sedative). General instructions  Take over-the-counter and prescription medicines only as told by your health care provider.  Keep all follow-up visits as told by your health  care provider. This is important. Contact a health care provider if you have:  A fever or chills.  You have redness, swelling, or pain around your insertion site. Get help right away if:  The catheter insertion area swells very fast.  You pass out.  You suddenly start to sweat or your skin gets clammy.  The catheter insertion area is bleeding, and the bleeding does not stop when you hold steady pressure on the area.  The area near or just beyond the catheter insertion site becomes pale, cool, tingly, or numb. These symptoms may represent a serious problem that is an emergency. Do not wait to see if the symptoms will go away. Get medical help right away. Call your local emergency services (911 in the [place] Do not drive yourself to the hospital. Summary  After the procedure, it is common to have bruising that usually fades within 1-2 weeks.  Check your femoral site every day for signs of infection.  Do not lift anything that is heavier than 10 lb (4.5 kg) for 5 days. This information is not intended to replace advice given to you by your health care provider. Make sure you discuss any questions you have with your health care provider. Document Released: 10/25/2013 Document Revised: 03/06/2017 Document Reviewed: 03/06/2017 Elsevier Patient Education  2020 [employer].

## 2018-10-03 NOTE — Op Note (Signed)
Patient name: [name] MRN: [id number] DOB: June 09, 1933 Sex: adult  10/03/2018 Pre-operative Diagnosis: Critical limb ischemia of the right lower extremity with tissue loss Post-operative diagnosis:  Same Surgeon:  Marty Heck, MD Procedure Performed: 1.  Ultrasound-guided access of the left common femoral artery 2.  Aortogram 3.  Bilateral lower extremity arteriogram with runoff 4.  Mynx closure of the left common femoral artery 5.  45 minutes of monitored moderate conscious sedation time  Indications: Patient is a 85 year old male who was seen in clinic yesterday with gangrenous right great toe progressing over the last 1-1/2 weeks.  He had evidence of severely decreased ABI of 0.5 with toe pressure 32 and evidence of popliteal and tibial occlusions on arterial duplex.  He presents today for aortogram lower extremity arteriogram and possible intervention after risks benefits were discussed.  Findings:   Aortogram showed single renal arteries bilaterally.  There was a greater than 60% stenosis of the right renal artery.  There was a small infrarenal aortic aneurysm.  Bilateral common iliac arteries were ectatic.  He had approximately 50% stenosis of the right common iliac but not considered flow-limiting since he had a bounding right femoral pulse.  Right lower extremity runoff which was the side of interest showed a patent common femoral, profunda, and SFA but then he had a high-grade stenosis of his above-knee popliteal artery and then went on to occlude his behind the knee popliteal artery with no identifiable below-knee popliteal artery.  His anterior tibial posterior tibial were occluded and there was a small peroneal that reconstituted past the popliteal occlusion but then this occluded again in the mid calf.  He had significant collaterals throughout the proximal to mid calf.  On the lateral foot shot we identified a posterior tibial that reconstituted just above the  ankle with filling of the plantar arch in the foot.  Left lower extremity arteriogram showed a patent common femoral and the profunda filled very delayed and late.  The SFA was disease throughout its course with moderate to high-grade stenosis with calcification.  The above and below-knee popliteal artery was patent.  Tibials were not evaluated due to contrast timing between the right and left leg.   Procedure:  The patient was identified in the holding area and taken to room 8.  The patient was then placed supine on the table and prepped and draped in the usual sterile fashion.  A time out was called.  Ultrasound was used to evaluate the left common femoral artery.  It was patent .  A digital ultrasound image was acquired.  A micropuncture needle was used to access the left common femoral artery under ultrasound guidance.  An 018 wire was advanced without resistance and a micropuncture sheath was placed.  The 018 wire was removed and a benson wire was placed.  The micropuncture sheath was exchanged for a 5 french sheath.  An omniflush catheter was advanced over the wire to the level of L-1.  An abdominal angiogram was obtained.  Next the Omni Flush catheter was pulled down and bilateral lower extremity runoff was obtained with pertinent findings noted above.  Ultimately this was a diagnostic arteriogram.  Wires catheters were removed.  A minx closure device was deployed in the left groin.  Plan: Discussed with patient and wife that he should be considered for right femoral to PT bypass at the ankle which is high risk versus palliative wound care and/or high level amputation if wound progresses.  Marty Heck, MD [facility] Office: [phone] Pager: [place]

## 2018-10-03 NOTE — H&P (Signed)
History and Physical Interval Note:  10/03/2018 2:47 PM  [name]  has presented today for surgery, with the diagnosis of pvd ulcer.  The various methods of treatment have been discussed with the patient and family. After consideration of risks, benefits and other options for treatment, the patient has consented to  Procedure(s): ABDOMINAL AORTOGRAM W/LOWER EXTREMITY (N/A) as a surgical intervention.  The patient's history has been reviewed, patient examined, no change in status, stable for surgery.  I have reviewed the patient's chart and labs.  Questions were answered to the patient's satisfaction.     Marty Heck  Patient name: [name]      MRN: [id number]        DOB: 05-Sep-1933          Sex: adult  REASON FOR CONSULT: ischemia of right great toe  HPI: [name] is a 85 y.o. adult, with history of arthritis, COPD, diabetes, hypertension, and CKD that presents for evaluation of ischemia to his right great toe.  Per the patient's wife he developed a wound on his great toe approximately 1-1/2 weeks ago.  This has since been nonhealing and has been progressing.  Patient also states he is having some pain in the foot and the toes.  He also describes some pain in the left foot as well consistent with rest pain.  He denies any previous lower extremity interventions.  No previous bypasses.  He states he does not smoke.  He has been followed by Dr. Kris Mouton as his primary care provider who referred him to vascular surgery.  ABI's prior to referral were 0.51 monophasic on right and 0.94 triphasic on left.      Past Medical History:  Diagnosis Date  . ANEMIA, IRON DEF, NOS 01/15/2007   Qualifier: Diagnosis of  By: Jeannine Kitten MD, Rodman Key    . Arthritis   . Cataract extraction status of left eye 2016  . Cataract extraction status of right eye 2016  . CHOLECYSTECTOMY, LAPAROSCOPIC, HX OF 04/12/2007   Qualifier: Diagnosis of  By: Netty Starring  MD, Lucianne Muss    . Diabetes  mellitus without complication ([place])   . Gout 2007  . Hypertension          Past Surgical History:  Procedure Laterality Date  . CHOLECYSTECTOMY           Family History  Problem Relation Age of Onset  . Asthma Mother   . Heart disease Brother   . Stroke Brother   . Diabetes Brother   . Cancer Brother        colon  . Arthritis Sister   . Arthritis Daughter   . Stroke Brother   . Heart disease Brother   . Alcohol abuse Sister   . Cirrhosis Sister   . Asthma Sister   . Diabetes Son   . Asthma Daughter     SOCIAL HISTORY: Social History        Socioeconomic History  . Marital status: Married    Spouse name: Enid Derry  . Number of children: 9  . Years of education: 10  . Highest education level: 10th grade  Occupational History  . Occupation: RetiredAnimal nutritionist Work  Social Needs  . Financial resource strain: Somewhat hard  . Food insecurity    Worry: Sometimes true    Inability: Sometimes true  . Transportation needs    Medical: No    Non-medical: No  Tobacco Use  . Smoking status: Former Smoker    Packs/day: 0.50  Years: 60.00    Pack years: 30.00    Types: Cigars, Cigarettes    Start date: 03/08/1951    Quit date: 06/08/2011    Years since quitting: 7.3  . Smokeless tobacco: Never Used  . Tobacco comment: no plans to start  Substance and Sexual Activity  . Alcohol use: No  . Drug use: No  . Sexual activity: Not Currently  Lifestyle  . Physical activity    Days per week: 0 days    Minutes per session: 0 min  . Stress: Not at all  Relationships  . Social connections    Talks on phone: More than three times a week    Gets together: Three times a week    Attends religious service: Never    Active member of club or organization: No    Attends meetings of clubs or organizations: Never    Relationship status: Married  . Intimate partner violence    Fear of current or ex partner: No     Emotionally abused: No    Physically abused: No    Forced sexual activity: No  Other Topics Concern  . Not on file  Social History Narrative   Health Care POA:    Emergency Contact: wife, Sebastian Lurz, [phone]   End of Life Plan:    Who lives with you: wife, step daughter, granddaughter . House has two levels, has trouble going up and down stairs due to knee pain and foot pain      Any pets: none   Diet: Patient has a varied diet and reports eating bacon and eggs every morning. Does not like most fruit. Likes flavored water, kool-aid.   No current exercise.     Seatbelts: Pt reports wearing seatbelt when in vehicle.   Hobbies: likes to watch [employer]                No Known Allergies        Current Outpatient Medications  Medication Sig Dispense Refill  . allopurinol (ZYLOPRIM) 300 MG tablet TAKE 1 TABLET BY MOUTH EVERY DAY 90 tablet 2  . BREO ELLIPTA 200-25 MCG/INH AEPB INHALE 1 PUFF BY MOUTH EVERY DAY 60 each 3  . diclofenac sodium (VOLTAREN) 1 % GEL APPLY 2 GRAMS TO AFFECTED AREA 4 TIMES A DAY 400 g 0  . levocetirizine (XYZAL) 5 MG tablet TAKE 1 TABLET BY MOUTH TWICE A DAY 180 tablet 0  . tiotropium (SPIRIVA) 18 MCG inhalation capsule INHALE 1 CAPSULE VIA HANDIHALER ONCE DAILY AT THE SAME TIME EVERY DAY 90 capsule 2   No current facility-administered medications for this visit.     REVIEW OF SYSTEMS:  [X]  denotes positive finding, [ ]  denotes negative finding Cardiac  Comments:  Chest pain or chest pressure:    Shortness of breath upon exertion:    Short of breath when lying flat:    Irregular heart rhythm:        Vascular    Pain in calf, thigh, or hip brought on by ambulation:    Pain in feet at night that wakes you up from your sleep:     Blood clot in your veins:    Leg swelling:         Pulmonary    Oxygen at home:    Productive cough:     Wheezing:     COPD x   Neurologic    Sudden  weakness in arms or legs:     Sudden numbness in  arms or legs:     Sudden onset of difficulty speaking or slurred speech:    Temporary loss of vision in one eye:     Problems with dizziness:         Gastrointestinal    Blood in stool:     Vomited blood:         Genitourinary    Burning when urinating:     Blood in urine:        Psychiatric    Major depression:         Hematologic    Bleeding problems:    Problems with blood clotting too easily:        Skin    Rashes or ulcers:        Constitutional    Fever or chills:      PHYSICAL EXAM:    Vitals:   10/02/18 1322  BP: (!) 146/86  Pulse: 92  Resp: 16  Temp: (!) 97.5 F (36.4 C)  TempSrc: Oral  SpO2: 95%  Weight: 180 lb (81.6 kg)  Height: 5\' 11"  (1.803 m)    GENERAL: The patient is a well-nourished adult, in no acute distress. The vital signs are documented above. CARDIAC: There is a regular rate and rhythm.  VASCULAR:  2+ radial pulse palpable bilaterally 2+ femoral pulses palpable bilaterally No palpable pedal pulses Right DP/PT signals monophasic Right great toe gangrene PULMONARY: There is good air exchange bilaterally without wheezing or rales. ABDOMEN: Soft and non-tender with normal pitched bowel sounds.  MUSCULOSKELETAL: There are no major deformities or cyanosis. NEUROLOGIC: No focal weakness or paresthesias are detected. SKIN: There are no ulcers or rashes noted. PSYCHIATRIC: The patient has a normal affect.  DATA:   I independently reviewed his noninvasive imaging and he has an ABI of 0.51 on the right with monophasic waveform 0.94 on the left with a triphasic waveform.  Lower extremity arterial duplex revealed total occlusion of the right popliteal artery as well as the anterior tibial - the peroneal and posterior tibial arteries do reconstitute distally.  Assessment/Plan:  85 year old male that presents with critical limb  ischemia of the right lower extremity with tissue loss.  He has a gangrenous nonhealing right great toe with monophasic waveform at the ankle and evidence of an occluded popliteal artery with distal reconstitution of a peroneal and posterior tibial on duplex.  I recommended aortogram with lower extremity arteriogram and possible intervention.  I discussed that he likely will require bypass but will need to do arteriogram to see if he has a suitable target.  Risk and benefits were discussed with the patient and his wife.   Marty Heck, MD [facility] Office: [phone] Pager: [phone]

## 2018-10-04 ENCOUNTER — Encounter (HOSPITAL_COMMUNITY): Payer: Self-pay | Admitting: Vascular Surgery

## 2018-10-04 ENCOUNTER — Telehealth: Payer: Self-pay | Admitting: *Deleted

## 2018-10-04 NOTE — Telephone Encounter (Signed)
Dr. Carlis Abbott spoke to this patient regarding having surgery on Monday 10-08-18 but the patient would like to wait.He wishes to  come back to the office in 2-3 weeks to discuss this surgery further. We will make him an appt 8-18 to see Dr. Carlis Abbott  but with the understanding that he can contact me to schedule his bypass sooner if he has increased symptoms. Patient is agreeable and voiced understanding.

## 2018-10-09 ENCOUNTER — Encounter (HOSPITAL_BASED_OUTPATIENT_CLINIC_OR_DEPARTMENT_OTHER): Payer: PPO | Attending: Internal Medicine

## 2018-10-09 ENCOUNTER — Encounter (HOSPITAL_COMMUNITY): Payer: Self-pay | Admitting: Vascular Surgery

## 2018-10-09 DIAGNOSIS — E1122 Type 2 diabetes mellitus with diabetic chronic kidney disease: Secondary | ICD-10-CM | POA: Diagnosis not present

## 2018-10-09 DIAGNOSIS — E1140 Type 2 diabetes mellitus with diabetic neuropathy, unspecified: Secondary | ICD-10-CM | POA: Diagnosis not present

## 2018-10-09 DIAGNOSIS — N189 Chronic kidney disease, unspecified: Secondary | ICD-10-CM | POA: Insufficient documentation

## 2018-10-09 DIAGNOSIS — E1151 Type 2 diabetes mellitus with diabetic peripheral angiopathy without gangrene: Secondary | ICD-10-CM | POA: Insufficient documentation

## 2018-10-09 DIAGNOSIS — Z87891 Personal history of nicotine dependence: Secondary | ICD-10-CM | POA: Insufficient documentation

## 2018-10-09 DIAGNOSIS — L97514 Non-pressure chronic ulcer of other part of right foot with necrosis of bone: Secondary | ICD-10-CM | POA: Insufficient documentation

## 2018-10-09 DIAGNOSIS — I70235 Atherosclerosis of native arteries of right leg with ulceration of other part of foot: Secondary | ICD-10-CM | POA: Insufficient documentation

## 2018-10-09 DIAGNOSIS — L97512 Non-pressure chronic ulcer of other part of right foot with fat layer exposed: Secondary | ICD-10-CM | POA: Diagnosis not present

## 2018-10-09 DIAGNOSIS — J449 Chronic obstructive pulmonary disease, unspecified: Secondary | ICD-10-CM | POA: Diagnosis not present

## 2018-10-09 DIAGNOSIS — E11621 Type 2 diabetes mellitus with foot ulcer: Secondary | ICD-10-CM | POA: Diagnosis not present

## 2018-10-10 DIAGNOSIS — E11621 Type 2 diabetes mellitus with foot ulcer: Secondary | ICD-10-CM | POA: Diagnosis not present

## 2018-10-11 ENCOUNTER — Other Ambulatory Visit: Payer: Self-pay

## 2018-10-11 DIAGNOSIS — M170 Bilateral primary osteoarthritis of knee: Secondary | ICD-10-CM

## 2018-10-11 MED ORDER — DICLOFENAC SODIUM 1 % TD GEL: TRANSDERMAL | 0 refills | Status: DC

## 2018-10-16 DIAGNOSIS — E11621 Type 2 diabetes mellitus with foot ulcer: Secondary | ICD-10-CM | POA: Diagnosis not present

## 2018-10-16 DIAGNOSIS — L97512 Non-pressure chronic ulcer of other part of right foot with fat layer exposed: Secondary | ICD-10-CM | POA: Diagnosis not present

## 2018-10-23 ENCOUNTER — Ambulatory Visit: Payer: PPO | Admitting: Vascular Surgery

## 2018-10-23 DIAGNOSIS — E11621 Type 2 diabetes mellitus with foot ulcer: Secondary | ICD-10-CM | POA: Diagnosis not present

## 2018-10-23 DIAGNOSIS — L97512 Non-pressure chronic ulcer of other part of right foot with fat layer exposed: Secondary | ICD-10-CM | POA: Diagnosis not present

## 2018-10-25 ENCOUNTER — Other Ambulatory Visit: Payer: Self-pay

## 2018-10-25 ENCOUNTER — Ambulatory Visit (INDEPENDENT_AMBULATORY_CARE_PROVIDER_SITE_OTHER): Payer: PPO | Admitting: Family Medicine

## 2018-10-25 DIAGNOSIS — L97511 Non-pressure chronic ulcer of other part of right foot limited to breakdown of skin: Secondary | ICD-10-CM

## 2018-10-25 MED ORDER — HYDROCODONE-ACETAMINOPHEN 5-325 MG PO TABS: 1.0000 | ORAL_TABLET | Freq: Four times a day (QID) | ORAL | 0 refills | Status: DC | PRN

## 2018-10-25 MED ORDER — GABAPENTIN 600 MG PO TABS: 600.0000 mg | ORAL_TABLET | Freq: Three times a day (TID) | ORAL | 0 refills | Status: DC

## 2018-10-25 NOTE — Assessment & Plan Note (Signed)
Dry gangrene due to arterial insufficiency.  Counseled patient that hopefully this ulcer will improve and possibly resolve with continued treatment by wound care, but his underlying problem will not resolve unless treated by vascular surgery.  Counseled him that it is completely understandable to not want to undergo invasive treatment, but he should understand that he may continue to have more problems with his foot that could cause hospitalization and even morbidity and mortality.  Encouraged him to consider what his goals of care are going forward.  In the meantime, we will palliate this issue with pain medication, although I counseled him that I do not want this to be chronic.  Since tramadol has not worked from the past, we will try Norco 5-325 mg for 30 tablets.  Patient understands that I will not refill this medication.  I have also prescribed gabapentin 600 mg nightly with the understanding that he can increase this up to 6 times per day if needed.  He understands not to drive or do anything that requires his full attention with either of these medications.

## 2018-10-25 NOTE — Patient Instructions (Signed)
It was nice meeting you today [name]!  I am prescribing gabapentin 600 mg up to 6 times daily and Norco for your pain.  Please take gabapentin before bedtime and start with once per day.  Please take Norco up to every 8 hours for pain.  I am only prescribing 30 pills of Norco because I do not want this to be a chronic medication for you.  Please follow-up with vascular surgery and wound care if your toe continues to be a problem for you.  I hope that this will heal, but you may continue to have problems with this if the blood flow continues to be very low to your toe.  If you have any questions or concerns, please feel free to call the clinic.   Be well,  Dr. Shan Levans

## 2018-10-25 NOTE — Progress Notes (Signed)
Subjective:    [name] - 85 y.o. adult MRN [id number]  Date of birth: October 09, 1933  CC:  [name] is here for a R great toe wound.  HPI: R great toe wound Recently had an arteriogram performed with vascular surgery, and he says that he does not like the options that they provided for him, which includes a bypass in the leg that may or may not be successful and possibly an amputation.  He canceled an appointment with them on 8/18 because he did not want to hear further discussion about these options.  He says that at 31, he does not want any "what if's."  He has been following with the wound care center for care of this wound on his right great toe, and although it is looking better, the pain has worsened over the last few days.  He asked the wound care center what to do about this and they said that they cannot provide any pain medications and that he should go see his primary provider instead.  He says that his pain is intense and has kept him from sleeping.  He has tried Tylenol without any improvement.  He is also tried tramadol for other pains in the past and has not felt no effect from this medication, so he does not want to try tramadol for this.  He also has a history of neuropathy for which he has taken gabapentin in the past and feels that it was moderately helpful for him.  Health Maintenance:  Health Maintenance Due  Topic Date Due  . URINE MICROALBUMIN  04/17/2015  . TETANUS/TDAP  07/24/2018  . COLONOSCOPY  10/04/2018  . INFLUENZA VACCINE  10/06/2018    -  reports that he quit smoking about 7 years ago. His smoking use included cigars and cigarettes. He started smoking about 67 years ago. He has a 30.00 pack-year smoking history. He has never used smokeless tobacco. - Review of Systems: Per HPI. - Past Medical History: Patient Active Problem List   Diagnosis Date Noted  . Critical lower limb ischemia 10/02/2018  . Toe ulcer ([place]) 09/20/2018  .  Hypomagnesemia 04/27/2018  . Hyperkalemia 04/27/2018  . Acute kidney injury (nontraumatic) ([place])   . CKD (chronic kidney disease) stage 3, GFR 30-59 ml/min (HCC) 01/04/2018  . Community acquired pneumonia of left lung 07/17/2017  . Seasonal allergies 06/30/2017  . Osteoarthritis of both knees 11/16/2015  . Left hip pain 12/12/2014  . Hoarseness 08/13/2014  . Dry skin dermatitis 04/16/2014  . History of anemia 04/07/2014  . Uncontrolled type 2 diabetes mellitus with diabetic neuropathy, without long-term current use of insulin ([place]) 03/21/2014  . Healthcare maintenance 07/09/2013  . Sacral nerve root compression 06/07/2011  . Herniated nucleus pulposus, L4-5 12/24/2010  . Essential hypertension 05/07/2010  . Chronic obstructive pulmonary disease ([place]) 03/20/2007  . Gout 01/15/2007  . Asthma 01/15/2007   - Medications: reviewed and updated   Objective:   Physical Exam BP 140/80   Pulse (!) 111   Wt 183 lb 3.2 oz (83.1 kg)   SpO2 96%   BMI 25.55 kg/m  Gen: NAD, alert, cooperative with exam, elderly appearing Skin: Dry gangrene of great right toe as pictured below.  No foul odor.  Full ROM of great toe.  Significantly tender to palpation.          Assessment & Plan:   Toe ulcer ([place]) Dry gangrene due to arterial insufficiency.  Counseled patient that hopefully this ulcer will improve  and possibly resolve with continued treatment by wound care, but his underlying problem will not resolve unless treated by vascular surgery.  Counseled him that it is completely understandable to not want to undergo invasive treatment, but he should understand that he may continue to have more problems with his foot that could cause hospitalization and even morbidity and mortality.  Encouraged him to consider what his goals of care are going forward.  In the meantime, we will palliate this issue with pain medication, although I counseled him that I do not want this to be chronic.  Since tramadol has  not worked from the past, we will try Norco 5-325 mg for 30 tablets.  Patient understands that I will not refill this medication.  I have also prescribed gabapentin 600 mg nightly with the understanding that he can increase this up to 6 times per day if needed.  He understands not to drive or do anything that requires his full attention with either of these medications.    Maia Breslow, M.D. 10/25/2018, 4:49 PM PGY-3, [place]

## 2018-10-30 DIAGNOSIS — L03031 Cellulitis of right toe: Secondary | ICD-10-CM | POA: Diagnosis not present

## 2018-10-30 DIAGNOSIS — L97512 Non-pressure chronic ulcer of other part of right foot with fat layer exposed: Secondary | ICD-10-CM | POA: Diagnosis not present

## 2018-10-30 DIAGNOSIS — E11621 Type 2 diabetes mellitus with foot ulcer: Secondary | ICD-10-CM | POA: Diagnosis not present

## 2018-11-03 ENCOUNTER — Other Ambulatory Visit: Payer: Self-pay | Admitting: Family Medicine

## 2018-11-03 DIAGNOSIS — M170 Bilateral primary osteoarthritis of knee: Secondary | ICD-10-CM

## 2018-11-06 ENCOUNTER — Encounter (HOSPITAL_BASED_OUTPATIENT_CLINIC_OR_DEPARTMENT_OTHER): Payer: PPO | Attending: Internal Medicine

## 2018-11-06 ENCOUNTER — Other Ambulatory Visit: Payer: Self-pay

## 2018-11-06 DIAGNOSIS — E11621 Type 2 diabetes mellitus with foot ulcer: Secondary | ICD-10-CM | POA: Diagnosis not present

## 2018-11-06 DIAGNOSIS — L97512 Non-pressure chronic ulcer of other part of right foot with fat layer exposed: Secondary | ICD-10-CM | POA: Diagnosis not present

## 2018-11-06 DIAGNOSIS — G629 Polyneuropathy, unspecified: Secondary | ICD-10-CM | POA: Diagnosis not present

## 2018-11-06 DIAGNOSIS — I70235 Atherosclerosis of native arteries of right leg with ulceration of other part of foot: Secondary | ICD-10-CM | POA: Diagnosis not present

## 2018-11-06 DIAGNOSIS — E1140 Type 2 diabetes mellitus with diabetic neuropathy, unspecified: Secondary | ICD-10-CM | POA: Diagnosis not present

## 2018-11-06 DIAGNOSIS — L97514 Non-pressure chronic ulcer of other part of right foot with necrosis of bone: Secondary | ICD-10-CM | POA: Diagnosis not present

## 2018-11-06 DIAGNOSIS — J449 Chronic obstructive pulmonary disease, unspecified: Secondary | ICD-10-CM | POA: Insufficient documentation

## 2018-11-06 DIAGNOSIS — I96 Gangrene, not elsewhere classified: Secondary | ICD-10-CM | POA: Diagnosis not present

## 2018-11-08 ENCOUNTER — Other Ambulatory Visit: Payer: Self-pay

## 2018-11-08 ENCOUNTER — Ambulatory Visit
Admission: RE | Admit: 2018-11-08 | Discharge: 2018-11-08 | Disposition: A | Discharge status: Home / Self Care | Payer: PPO | Source: Ambulatory Visit | Attending: Internal Medicine | Admitting: Internal Medicine

## 2018-11-08 ENCOUNTER — Other Ambulatory Visit: Payer: Self-pay | Admitting: Internal Medicine

## 2018-11-08 ENCOUNTER — Ambulatory Visit (INDEPENDENT_AMBULATORY_CARE_PROVIDER_SITE_OTHER): Payer: PPO | Admitting: Family Medicine

## 2018-11-08 VITALS — BP 135/80 | HR 123 | Wt 181.2 lb

## 2018-11-08 DIAGNOSIS — S91301A Unspecified open wound, right foot, initial encounter: Secondary | ICD-10-CM

## 2018-11-08 DIAGNOSIS — I70229 Atherosclerosis of native arteries of extremities with rest pain, unspecified extremity: Secondary | ICD-10-CM

## 2018-11-08 DIAGNOSIS — S81809A Unspecified open wound, unspecified lower leg, initial encounter: Secondary | ICD-10-CM

## 2018-11-08 DIAGNOSIS — I998 Other disorder of circulatory system: Secondary | ICD-10-CM | POA: Diagnosis not present

## 2018-11-08 DIAGNOSIS — L97519 Non-pressure chronic ulcer of other part of right foot with unspecified severity: Secondary | ICD-10-CM

## 2018-11-08 DIAGNOSIS — S91101A Unspecified open wound of right great toe without damage to nail, initial encounter: Secondary | ICD-10-CM | POA: Diagnosis not present

## 2018-11-08 MED ORDER — HYDROCODONE-ACETAMINOPHEN 5-325 MG PO TABS: 1.0000 | ORAL_TABLET | Freq: Four times a day (QID) | ORAL | 0 refills | Status: DC | PRN

## 2018-11-08 NOTE — Patient Instructions (Signed)
It was great seeing you today!  I am sorry you are having so much trouble with your toe and foot.  Again I think your neuropathic type symptoms are due to the multiple blockages in the large blood vessels of your leg.  I reviewed your ultrasound findings, arteriogram findings, and the normal anatomy of your leg arteries.  A request for second opinion I did refer you to vascular surgery at [facility] as a stat referral.  Should expect to hear from them soon.  I believe that your 2 long-term options are a bypass or partial amputation of your leg.  Sounds like you had x-rays ordered for your foot.  Please get those in the next couple of days this will help [place] determine if you have an infection in your toe bone.  I gave you another 5-day supply of narcotic pain medication to help get that appointment.

## 2018-11-11 ENCOUNTER — Encounter: Payer: Self-pay | Admitting: Family Medicine

## 2018-11-11 NOTE — Assessment & Plan Note (Signed)
Patient with notable complete occlusion of popliteal artery, anterior tibial artery, posterior tibial artery with high-grade stenosis of SFA and profunda on right lower extremity.  Patient did not follow-up with vascular surgery.  His toe ulcer has markedly worsened.  Explained to patient that his "neuropathic" symptoms are likely due to new onset ischemia of the dorsal surface of his right foot.  Explained that his lack of pain in his right great toe is likely due to tissue and nerve death.  Explained that it is absolutely critical that he gets reevaluated by vascular surgery whether that be at [facility] or at [place] stat referral to vascular surgery at [facility].

## 2018-11-11 NOTE — Progress Notes (Signed)
HPI 85 year old male who presents for numbness, tingling, and intermittent burning sensation on the "top of his foot".  This sensation occurs at night and intermittently throughout the day especially with activity.  Patient believes that he has neuropathy going on.  Of note patient underwent right lower extremity arteriogram on 10/03/2018.  He was noted to have complete occlusion of his below-knee popliteal artery, anterior tibial artery, posterior tibial artery.  SFA and profunda with high-grade stenosis throughout as well.  Patient had known ulceration of right great toe and severely reduced ABIs which prompted this evaluation.  Patient was offered a bypass procedure versus palliative wound care.  Patient did not follow-up with vascular surgery stating that he did not want that degree of uncertainty at his old age.  He states that his toe ulcer has "gotten better".  Patient states that he no longer has pain in his great toe.  He states that the bottom of his foot feels cold.   CC: Foot neuropathy   ROS:   Review of Systems See HPI for ROS.   CC, SH/smoking status, and VS noted  Objective: BP 135/80   Pulse (!) 123   Wt 181 lb 3.2 oz (82.2 kg)   SpO2 97%   BMI 25.27 kg/m  Gen: 85 year old African-American male, no acute distress, resting comfortably CV: Regular rhythm, tachycardia.  No palpable pedal pulses right side. Resp: Lungs clear to auscultation bilaterally, no acute distress Neuro: Alert and oriented, Speech clear, No gross deficits Right foot: Mildly dusky area on dorsal surface of right foot as compared to surrounding tissue.  Black necrotic tissue up to PIP joint right great toe.  Nail no longer present.  Wound is moist.        Assessment and plan:  Toe ulcer ([place]) Necrotic toe wound has markedly worsened since 7/22 appointment with patient.  Unfortunately patient appears to have been in denial about his toe wound and his prognosis.  Had very long discussion with  patient regarding his options, and the pathophysiology of his necrotic toe ulcer.  He has been receiving wound care since my last appointment with him.  His wound care specialist did schedule an x-ray with him to evaluate for osteomyelitis which I agree with.  Discussed that patient's wound has no chance of healing without surgical intervention.  Related patient that even with that I am not sure it would heal at this point given the extent of the necrotic tissue.  Patient voiced understanding but stated that he would like a second opinion to see if there are other vascular options aside from a bypass.  Specifically he would like to be seen by [facility] vascular surgery.  Explained to patient that this is reasonable but the longer this goes untreated the odds of him developing osteomyelitis or becoming septic from this wound increase.  At patient request placed at referral to [facility] vascular surgery.  Gave patient 30 tablets supply of oxycodone. -Had long discussion with patient regarding pathophysiology of his wound, his options, and prognosis for his foot -Placed that referral to vascular surgery at [place] every 6 hours as needed, give 30 tablets  Critical lower limb ischemia Patient with notable complete occlusion of popliteal artery, anterior tibial artery, posterior tibial artery with high-grade stenosis of SFA and profunda on right lower extremity.  Patient did not follow-up with vascular surgery.  His toe ulcer has markedly worsened.  Explained to patient that his "neuropathic" symptoms are likely due to  new onset ischemia of the dorsal surface of his right foot.  Explained that his lack of pain in his right great toe is likely due to tissue and nerve death.  Explained that it is absolutely critical that he gets reevaluated by vascular surgery whether that be at [facility] or at [place] stat referral to vascular surgery at [facility].   Orders Placed This Encounter   Procedures  . Ambulatory referral to Vascular Surgery    Referral Priority:   Urgent    Referral Type:   Surgical    Referral Reason:   Second Opinion    Referral Location:   [place]    Requested Specialty:   Vascular Surgery    Number of Visits Requested:   1    Meds ordered this encounter  Medications  . HYDROcodone-acetaminophen (NORCO/VICODIN) 5-325 MG tablet    Sig: Take 1 tablet by mouth every 6 (six) hours as needed for moderate pain.    Dispense:  30 tablet    Refill:  0     Guadalupe Dawn MD PGY-3 Family Medicine Resident  11/11/2018 7:53 PM

## 2018-11-11 NOTE — Assessment & Plan Note (Signed)
Necrotic toe wound has markedly worsened since 7/22 appointment with patient.  Unfortunately patient appears to have been in denial about his toe wound and his prognosis.  Had very long discussion with patient regarding his options, and the pathophysiology of his necrotic toe ulcer.  He has been receiving wound care since my last appointment with him.  His wound care specialist did schedule an x-ray with him to evaluate for osteomyelitis which I agree with.  Discussed that patient's wound has no chance of healing without surgical intervention.  Related patient that even with that I am not sure it would heal at this point given the extent of the necrotic tissue.  Patient voiced understanding but stated that he would like a second opinion to see if there are other vascular options aside from a bypass.  Specifically he would like to be seen by [facility] vascular surgery.  Explained to patient that this is reasonable but the longer this goes untreated the odds of him developing osteomyelitis or becoming septic from this wound increase.  At patient request placed at referral to [facility] vascular surgery.  Gave patient 30 tablets supply of oxycodone. -Had long discussion with patient regarding pathophysiology of his wound, his options, and prognosis for his foot -Placed that referral to vascular surgery at [place] every 6 hours as needed, give 30 tablets

## 2018-11-14 ENCOUNTER — Other Ambulatory Visit: Payer: Self-pay

## 2018-11-14 DIAGNOSIS — J302 Other seasonal allergic rhinitis: Secondary | ICD-10-CM

## 2018-11-14 MED ORDER — LEVOCETIRIZINE DIHYDROCHLORIDE 5 MG PO TABS: 5.0000 mg | ORAL_TABLET | Freq: Two times a day (BID) | ORAL | 0 refills | Status: DC

## 2018-11-20 ENCOUNTER — Other Ambulatory Visit: Payer: Self-pay | Admitting: Family Medicine

## 2018-11-20 DIAGNOSIS — E11621 Type 2 diabetes mellitus with foot ulcer: Secondary | ICD-10-CM | POA: Diagnosis not present

## 2018-11-20 DIAGNOSIS — E119 Type 2 diabetes mellitus without complications: Secondary | ICD-10-CM

## 2018-11-20 DIAGNOSIS — IMO0002 Reserved for concepts with insufficient information to code with codable children: Secondary | ICD-10-CM

## 2018-11-20 DIAGNOSIS — E1140 Type 2 diabetes mellitus with diabetic neuropathy, unspecified: Secondary | ICD-10-CM

## 2018-11-20 DIAGNOSIS — L97512 Non-pressure chronic ulcer of other part of right foot with fat layer exposed: Secondary | ICD-10-CM | POA: Diagnosis not present

## 2018-11-21 ENCOUNTER — Telehealth: Payer: Self-pay | Admitting: Family Medicine

## 2018-11-21 DIAGNOSIS — I739 Peripheral vascular disease, unspecified: Secondary | ICD-10-CM | POA: Diagnosis not present

## 2018-11-21 DIAGNOSIS — E1152 Type 2 diabetes mellitus with diabetic peripheral angiopathy with gangrene: Secondary | ICD-10-CM | POA: Diagnosis not present

## 2018-11-21 DIAGNOSIS — Z87891 Personal history of nicotine dependence: Secondary | ICD-10-CM | POA: Diagnosis not present

## 2018-11-21 DIAGNOSIS — Z01818 Encounter for other preprocedural examination: Secondary | ICD-10-CM | POA: Diagnosis not present

## 2018-11-21 DIAGNOSIS — I998 Other disorder of circulatory system: Secondary | ICD-10-CM | POA: Diagnosis not present

## 2018-11-21 DIAGNOSIS — L97519 Non-pressure chronic ulcer of other part of right foot with unspecified severity: Secondary | ICD-10-CM | POA: Diagnosis not present

## 2018-11-21 DIAGNOSIS — I96 Gangrene, not elsewhere classified: Secondary | ICD-10-CM | POA: Diagnosis not present

## 2018-11-21 NOTE — Telephone Encounter (Signed)
Vascular surgeon Andreas Newport from [facility] is calling and would like to speak with Dr. Kris Mouton to discuss his plan of treatment with pt.   The best call back number is cell phone [phone].

## 2018-11-24 DIAGNOSIS — Z01812 Encounter for preprocedural laboratory examination: Secondary | ICD-10-CM | POA: Diagnosis not present

## 2018-11-24 DIAGNOSIS — L97519 Non-pressure chronic ulcer of other part of right foot with unspecified severity: Secondary | ICD-10-CM | POA: Diagnosis not present

## 2018-11-24 DIAGNOSIS — Z20828 Contact with and (suspected) exposure to other viral communicable diseases: Secondary | ICD-10-CM | POA: Diagnosis not present

## 2018-11-25 NOTE — Telephone Encounter (Signed)
Late entry  Discussed with Dr. Nyoka Cowden on 9/16. Called to give me update on patient status. Will undergo arteriogram on 9/25. Underwent vein mapping study to evaluate potential options. Will try to improve blood flow endovascularly, but will convert to potential bypass if unable to. Appreciate the call, will follow along with notes via care everywhere.  Guadalupe Dawn MD PGY-3 Family Medicine Resident

## 2018-11-28 DIAGNOSIS — M79609 Pain in unspecified limb: Secondary | ICD-10-CM | POA: Insufficient documentation

## 2018-11-28 DIAGNOSIS — R69 Illness, unspecified: Secondary | ICD-10-CM | POA: Insufficient documentation

## 2018-11-28 DIAGNOSIS — M1990 Unspecified osteoarthritis, unspecified site: Secondary | ICD-10-CM | POA: Insufficient documentation

## 2018-11-28 DIAGNOSIS — H9190 Unspecified hearing loss, unspecified ear: Secondary | ICD-10-CM | POA: Insufficient documentation

## 2018-11-30 ENCOUNTER — Other Ambulatory Visit: Payer: Self-pay

## 2018-11-30 DIAGNOSIS — E1122 Type 2 diabetes mellitus with diabetic chronic kidney disease: Secondary | ICD-10-CM | POA: Diagnosis not present

## 2018-11-30 DIAGNOSIS — I70261 Atherosclerosis of native arteries of extremities with gangrene, right leg: Secondary | ICD-10-CM | POA: Diagnosis not present

## 2018-11-30 DIAGNOSIS — J45909 Unspecified asthma, uncomplicated: Secondary | ICD-10-CM | POA: Diagnosis not present

## 2018-11-30 DIAGNOSIS — I739 Peripheral vascular disease, unspecified: Secondary | ICD-10-CM | POA: Diagnosis not present

## 2018-11-30 DIAGNOSIS — Z87891 Personal history of nicotine dependence: Secondary | ICD-10-CM | POA: Diagnosis not present

## 2018-11-30 DIAGNOSIS — L97513 Non-pressure chronic ulcer of other part of right foot with necrosis of muscle: Secondary | ICD-10-CM | POA: Diagnosis not present

## 2018-11-30 DIAGNOSIS — N183 Chronic kidney disease, stage 3 (moderate): Secondary | ICD-10-CM | POA: Diagnosis not present

## 2018-11-30 DIAGNOSIS — I998 Other disorder of circulatory system: Secondary | ICD-10-CM | POA: Diagnosis not present

## 2018-11-30 DIAGNOSIS — I129 Hypertensive chronic kidney disease with stage 1 through stage 4 chronic kidney disease, or unspecified chronic kidney disease: Secondary | ICD-10-CM | POA: Diagnosis not present

## 2018-11-30 DIAGNOSIS — M5136 Other intervertebral disc degeneration, lumbar region: Secondary | ICD-10-CM | POA: Diagnosis not present

## 2018-11-30 DIAGNOSIS — Z20828 Contact with and (suspected) exposure to other viral communicable diseases: Secondary | ICD-10-CM | POA: Diagnosis not present

## 2018-11-30 DIAGNOSIS — E1152 Type 2 diabetes mellitus with diabetic peripheral angiopathy with gangrene: Secondary | ICD-10-CM | POA: Diagnosis not present

## 2018-11-30 DIAGNOSIS — J439 Emphysema, unspecified: Secondary | ICD-10-CM | POA: Diagnosis not present

## 2018-11-30 MED ORDER — GABAPENTIN 600 MG PO TABS: 600.0000 mg | ORAL_TABLET | Freq: Three times a day (TID) | ORAL | 0 refills | Status: DC

## 2018-12-04 DIAGNOSIS — Z87891 Personal history of nicotine dependence: Secondary | ICD-10-CM | POA: Diagnosis not present

## 2018-12-04 DIAGNOSIS — E1140 Type 2 diabetes mellitus with diabetic neuropathy, unspecified: Secondary | ICD-10-CM | POA: Diagnosis not present

## 2018-12-04 DIAGNOSIS — Z7984 Long term (current) use of oral hypoglycemic drugs: Secondary | ICD-10-CM | POA: Diagnosis not present

## 2018-12-04 DIAGNOSIS — I998 Other disorder of circulatory system: Secondary | ICD-10-CM | POA: Diagnosis not present

## 2018-12-04 DIAGNOSIS — I70291 Other atherosclerosis of native arteries of extremities, right leg: Secondary | ICD-10-CM | POA: Diagnosis not present

## 2018-12-04 DIAGNOSIS — M109 Gout, unspecified: Secondary | ICD-10-CM | POA: Diagnosis not present

## 2018-12-04 DIAGNOSIS — Z833 Family history of diabetes mellitus: Secondary | ICD-10-CM | POA: Diagnosis not present

## 2018-12-04 DIAGNOSIS — Z8701 Personal history of pneumonia (recurrent): Secondary | ICD-10-CM | POA: Diagnosis not present

## 2018-12-04 DIAGNOSIS — J438 Other emphysema: Secondary | ICD-10-CM | POA: Diagnosis not present

## 2018-12-04 DIAGNOSIS — Z01818 Encounter for other preprocedural examination: Secondary | ICD-10-CM | POA: Diagnosis not present

## 2018-12-04 DIAGNOSIS — D6489 Other specified anemias: Secondary | ICD-10-CM | POA: Diagnosis not present

## 2018-12-04 DIAGNOSIS — I129 Hypertensive chronic kidney disease with stage 1 through stage 4 chronic kidney disease, or unspecified chronic kidney disease: Secondary | ICD-10-CM | POA: Diagnosis not present

## 2018-12-04 DIAGNOSIS — Z20828 Contact with and (suspected) exposure to other viral communicable diseases: Secondary | ICD-10-CM | POA: Diagnosis not present

## 2018-12-04 DIAGNOSIS — Z800 Family history of malignant neoplasm of digestive organs: Secondary | ICD-10-CM | POA: Diagnosis not present

## 2018-12-04 DIAGNOSIS — E1122 Type 2 diabetes mellitus with diabetic chronic kidney disease: Secondary | ICD-10-CM | POA: Diagnosis not present

## 2018-12-04 DIAGNOSIS — Z7951 Long term (current) use of inhaled steroids: Secondary | ICD-10-CM | POA: Diagnosis not present

## 2018-12-04 DIAGNOSIS — I110 Hypertensive heart disease with heart failure: Secondary | ICD-10-CM | POA: Diagnosis not present

## 2018-12-04 DIAGNOSIS — N183 Chronic kidney disease, stage 3 unspecified: Secondary | ICD-10-CM | POA: Diagnosis not present

## 2018-12-04 DIAGNOSIS — J449 Chronic obstructive pulmonary disease, unspecified: Secondary | ICD-10-CM | POA: Diagnosis not present

## 2018-12-04 DIAGNOSIS — H9190 Unspecified hearing loss, unspecified ear: Secondary | ICD-10-CM | POA: Diagnosis not present

## 2018-12-04 DIAGNOSIS — E1152 Type 2 diabetes mellitus with diabetic peripheral angiopathy with gangrene: Secondary | ICD-10-CM | POA: Diagnosis not present

## 2018-12-04 DIAGNOSIS — Z89411 Acquired absence of right great toe: Secondary | ICD-10-CM | POA: Diagnosis not present

## 2018-12-04 DIAGNOSIS — Z7982 Long term (current) use of aspirin: Secondary | ICD-10-CM | POA: Diagnosis not present

## 2018-12-04 DIAGNOSIS — L97513 Non-pressure chronic ulcer of other part of right foot with necrosis of muscle: Secondary | ICD-10-CM | POA: Diagnosis not present

## 2018-12-04 DIAGNOSIS — I771 Stricture of artery: Secondary | ICD-10-CM | POA: Diagnosis not present

## 2018-12-04 DIAGNOSIS — L97519 Non-pressure chronic ulcer of other part of right foot with unspecified severity: Secondary | ICD-10-CM | POA: Diagnosis not present

## 2018-12-04 DIAGNOSIS — I70261 Atherosclerosis of native arteries of extremities with gangrene, right leg: Secondary | ICD-10-CM | POA: Diagnosis not present

## 2018-12-04 DIAGNOSIS — Z9049 Acquired absence of other specified parts of digestive tract: Secondary | ICD-10-CM | POA: Diagnosis not present

## 2018-12-04 DIAGNOSIS — R000 Tachycardia, unspecified: Secondary | ICD-10-CM | POA: Diagnosis not present

## 2018-12-04 DIAGNOSIS — Z79899 Other long term (current) drug therapy: Secondary | ICD-10-CM | POA: Diagnosis not present

## 2018-12-04 DIAGNOSIS — I739 Peripheral vascular disease, unspecified: Secondary | ICD-10-CM | POA: Diagnosis not present

## 2018-12-04 DIAGNOSIS — E11621 Type 2 diabetes mellitus with foot ulcer: Secondary | ICD-10-CM | POA: Diagnosis not present

## 2018-12-04 DIAGNOSIS — I7092 Chronic total occlusion of artery of the extremities: Secondary | ICD-10-CM | POA: Diagnosis not present

## 2018-12-06 ENCOUNTER — Other Ambulatory Visit: Payer: Self-pay | Admitting: *Deleted

## 2018-12-06 DIAGNOSIS — M170 Bilateral primary osteoarthritis of knee: Secondary | ICD-10-CM

## 2018-12-06 MED ORDER — DICLOFENAC SODIUM 1 % TD GEL: TRANSDERMAL | 0 refills | Status: DC

## 2018-12-10 ENCOUNTER — Other Ambulatory Visit: Payer: Self-pay

## 2018-12-10 DIAGNOSIS — J449 Chronic obstructive pulmonary disease, unspecified: Secondary | ICD-10-CM

## 2018-12-10 MED ORDER — BREO ELLIPTA 200-25 MCG/INH IN AEPB: 1.0000 | INHALATION_SPRAY | Freq: Every day | RESPIRATORY_TRACT | 3 refills | Status: DC

## 2018-12-31 DIAGNOSIS — I739 Peripheral vascular disease, unspecified: Secondary | ICD-10-CM | POA: Diagnosis not present

## 2018-12-31 DIAGNOSIS — B351 Tinea unguium: Secondary | ICD-10-CM | POA: Diagnosis not present

## 2018-12-31 DIAGNOSIS — Z89431 Acquired absence of right foot: Secondary | ICD-10-CM | POA: Diagnosis not present

## 2019-01-01 DIAGNOSIS — Z95828 Presence of other vascular implants and grafts: Secondary | ICD-10-CM | POA: Diagnosis not present

## 2019-01-01 DIAGNOSIS — L97519 Non-pressure chronic ulcer of other part of right foot with unspecified severity: Secondary | ICD-10-CM | POA: Diagnosis not present

## 2019-01-09 ENCOUNTER — Other Ambulatory Visit: Payer: Self-pay | Admitting: Family Medicine

## 2019-01-09 DIAGNOSIS — M170 Bilateral primary osteoarthritis of knee: Secondary | ICD-10-CM

## 2019-01-15 DIAGNOSIS — I739 Peripheral vascular disease, unspecified: Secondary | ICD-10-CM | POA: Diagnosis not present

## 2019-01-15 DIAGNOSIS — Z89411 Acquired absence of right great toe: Secondary | ICD-10-CM | POA: Diagnosis not present

## 2019-01-15 DIAGNOSIS — M86171 Other acute osteomyelitis, right ankle and foot: Secondary | ICD-10-CM | POA: Insufficient documentation

## 2019-01-15 DIAGNOSIS — L97512 Non-pressure chronic ulcer of other part of right foot with fat layer exposed: Secondary | ICD-10-CM | POA: Diagnosis not present

## 2019-01-17 DIAGNOSIS — M86171 Other acute osteomyelitis, right ankle and foot: Secondary | ICD-10-CM | POA: Diagnosis not present

## 2019-01-17 DIAGNOSIS — Z20828 Contact with and (suspected) exposure to other viral communicable diseases: Secondary | ICD-10-CM | POA: Diagnosis not present

## 2019-01-17 DIAGNOSIS — Z01812 Encounter for preprocedural laboratory examination: Secondary | ICD-10-CM | POA: Diagnosis not present

## 2019-01-23 DIAGNOSIS — L97512 Non-pressure chronic ulcer of other part of right foot with fat layer exposed: Secondary | ICD-10-CM | POA: Diagnosis not present

## 2019-01-23 DIAGNOSIS — I7025 Atherosclerosis of native arteries of other extremities with ulceration: Secondary | ICD-10-CM | POA: Diagnosis not present

## 2019-01-23 DIAGNOSIS — Z7982 Long term (current) use of aspirin: Secondary | ICD-10-CM | POA: Diagnosis not present

## 2019-01-23 DIAGNOSIS — I129 Hypertensive chronic kidney disease with stage 1 through stage 4 chronic kidney disease, or unspecified chronic kidney disease: Secondary | ICD-10-CM | POA: Diagnosis not present

## 2019-01-23 DIAGNOSIS — Z95820 Peripheral vascular angioplasty status with implants and grafts: Secondary | ICD-10-CM | POA: Diagnosis not present

## 2019-01-23 DIAGNOSIS — M109 Gout, unspecified: Secondary | ICD-10-CM | POA: Diagnosis not present

## 2019-01-23 DIAGNOSIS — R7303 Prediabetes: Secondary | ICD-10-CM | POA: Diagnosis not present

## 2019-01-23 DIAGNOSIS — J449 Chronic obstructive pulmonary disease, unspecified: Secondary | ICD-10-CM | POA: Diagnosis not present

## 2019-01-23 DIAGNOSIS — N183 Chronic kidney disease, stage 3 unspecified: Secondary | ICD-10-CM | POA: Diagnosis not present

## 2019-01-23 DIAGNOSIS — Z87891 Personal history of nicotine dependence: Secondary | ICD-10-CM | POA: Diagnosis not present

## 2019-01-23 DIAGNOSIS — M86171 Other acute osteomyelitis, right ankle and foot: Secondary | ICD-10-CM | POA: Diagnosis not present

## 2019-01-23 DIAGNOSIS — E1140 Type 2 diabetes mellitus with diabetic neuropathy, unspecified: Secondary | ICD-10-CM | POA: Diagnosis not present

## 2019-01-23 DIAGNOSIS — Z7901 Long term (current) use of anticoagulants: Secondary | ICD-10-CM | POA: Diagnosis not present

## 2019-01-23 DIAGNOSIS — E1165 Type 2 diabetes mellitus with hyperglycemia: Secondary | ICD-10-CM | POA: Diagnosis not present

## 2019-01-25 ENCOUNTER — Telehealth: Payer: Self-pay | Admitting: Family Medicine

## 2019-01-25 NOTE — Telephone Encounter (Signed)
Patient's daughter is needing a letter stating that because of the patient's age and health conditions, she will need to stay with them and will need to work remotely due to [employer] and working in a school and patient's daughter does not want to go inside the school to work.  Daughter name is Nani Skillern, phone [phone] and letter can be mailed to the patient's address.

## 2019-02-26 ENCOUNTER — Other Ambulatory Visit: Payer: Self-pay | Admitting: Family Medicine

## 2019-03-11 ENCOUNTER — Other Ambulatory Visit: Payer: Self-pay

## 2019-03-11 ENCOUNTER — Ambulatory Visit (INDEPENDENT_AMBULATORY_CARE_PROVIDER_SITE_OTHER): Payer: PPO | Admitting: Family Medicine

## 2019-03-11 DIAGNOSIS — I998 Other disorder of circulatory system: Secondary | ICD-10-CM

## 2019-03-11 DIAGNOSIS — L97519 Non-pressure chronic ulcer of other part of right foot with unspecified severity: Secondary | ICD-10-CM | POA: Diagnosis not present

## 2019-03-11 DIAGNOSIS — Z0130 Encounter for examination of blood pressure without abnormal findings: Secondary | ICD-10-CM | POA: Diagnosis not present

## 2019-03-11 DIAGNOSIS — Z23 Encounter for immunization: Secondary | ICD-10-CM

## 2019-03-11 DIAGNOSIS — I739 Peripheral vascular disease, unspecified: Secondary | ICD-10-CM | POA: Diagnosis not present

## 2019-03-11 DIAGNOSIS — F4321 Adjustment disorder with depressed mood: Secondary | ICD-10-CM

## 2019-03-11 DIAGNOSIS — I70229 Atherosclerosis of native arteries of extremities with rest pain, unspecified extremity: Secondary | ICD-10-CM

## 2019-03-11 NOTE — Patient Instructions (Signed)
It was great seeing you again today! I'm very very sorry to hear about the passing of your wife. I know that it is very tough for anybody to deal with but I am glad that you are holding up well.  Your blood pressure looks pretty good today. Is 135/80 which is within her goal for your age.  In regards to the Covid vaccine we unfortunately do not have it available at the clinic yet. We will let you know when we have it available as you are very close to the beginning of the line given your age and medical problems.  We gave you a flu shot today.  I am glad your foot is doing well. I'm sorry you had to leave your big toe but glad you still have mobility I did not have to have a larger amputation. Continue taking the aspirin and cholesterol medication the vascular surgeon started you on. The wound is looking very good.

## 2019-03-15 ENCOUNTER — Encounter: Payer: Self-pay | Admitting: Family Medicine

## 2019-03-15 DIAGNOSIS — Z0130 Encounter for examination of blood pressure without abnormal findings: Secondary | ICD-10-CM | POA: Insufficient documentation

## 2019-03-15 DIAGNOSIS — I739 Peripheral vascular disease, unspecified: Secondary | ICD-10-CM | POA: Insufficient documentation

## 2019-03-15 DIAGNOSIS — F4321 Adjustment disorder with depressed mood: Secondary | ICD-10-CM | POA: Insufficient documentation

## 2019-03-15 NOTE — Progress Notes (Signed)
HPI 86 year old male who presents for blood pressure management.  He states that at home health nurse checked his blood pressure few days ago and the systolic was in the [id number] recommended he get checked out by his doctor.  He is not sure what size of cuff he has at home.  He has not been having any headaches, blurry vision, or any side effects.  He does not currently take any blood pressure medication.  Patient has been dealing with the loss of his wife.  Per reports she was in very good health when she slumped over suddenly and was rushed to the hospital due to a blood clot in her brain.  Fortunately she passed away due to this.  This occurred all a bit more than a month ago.  Patient states that he is hanging in there and dealing with it pretty well.  He has a good support group with the rest of his family and has been occupying himself with his hobbies.  Patient expresses desire to get flu shot.  Of note patient seen by me multiple times back in August and September for ischemia of his right foot.  He underwent distal femoral bypass with right SFA to posterior tib, along with right great toe amputation.  He is well healed from this, and has been able to maintain his mobility which was his main concern.  CC: blood pressure   ROS:   Review of Systems See HPI for ROS.   CC, SH/smoking status, and VS noted  Objective: BP 135/80   Pulse (!) 120   Wt 180 lb 9.6 oz (81.9 kg)   SpO2 95%   BMI 25.19 kg/m  Gen: Very pleasant 86 year old African-American male, no acute distress, resting comfortably CV: Tachycardia, regular rhythm, no M/R/G Resp: Lungs clear to auscultation bilaterally, no accessory muscle use Neuro: Alert and oriented, Speech clear, No gross deficits Right foot: Amputation right great toe.  Incision site is well-healed at this point.  Maintains good mobility on the right foot.   Assessment and plan:  Critical lower limb ischemia Status post bypass distal femoral to  SFA.  Status post right great toe amputation.  Doing well from the standpoint.  Will consider problem resolved.  Toe ulcer ([place]) Status post right great toe amputation.  Will consider problem resolved.  Has been able to maintain good mobility with the right foot.  Grief Patient had unexpected passing of his wife little bit more than a month ago.  He has been coping in healthy ways with the support of his family and his hobbies.  No thoughts of hurting himself or others.  Offered him support in the form of counseling if he desires, he declined at this time.  Blood pressure check BP 135/80.  Not on any antihypertensives at this time.  Given his age and lack of symptoms and BP reading, would not consider treatment at this time.  Can monitor at subsequent visits and add on agent if necessary.  Peripheral artery disease ([place]) Had severe peripheral artery disease right leg.  Status post femoral to SFA bypass.  Skin warm, good cap refill at this visit.  Started on aspirin 81 mg daily, atorvastatin 20 mg daily [facility] vascular surgery.  Encouraged patient to continue taking his medications as prescribed.   Orders Placed This Encounter  Procedures  . Flu Vaccine QUAD High Dose(Fluad)    No orders of the defined types were placed in this encounter.    Guadalupe Dawn MD  PGY-3 Family Medicine Resident  03/15/2019 3:17 PM

## 2019-03-15 NOTE — Assessment & Plan Note (Signed)
Had severe peripheral artery disease right leg.  Status post femoral to SFA bypass.  Skin warm, good cap refill at this visit.  Started on aspirin 81 mg daily, atorvastatin 20 mg daily [facility] vascular surgery.  Encouraged patient to continue taking his medications as prescribed.

## 2019-03-15 NOTE — Assessment & Plan Note (Signed)
Status post right great toe amputation.  Will consider problem resolved.  Has been able to maintain good mobility with the right foot.

## 2019-03-15 NOTE — Assessment & Plan Note (Signed)
BP 135/80.  Not on any antihypertensives at this time.  Given his age and lack of symptoms and BP reading, would not consider treatment at this time.  Can monitor at subsequent visits and add on agent if necessary.

## 2019-03-15 NOTE — Assessment & Plan Note (Signed)
Patient had unexpected passing of his wife little bit more than a month ago.  He has been coping in healthy ways with the support of his family and his hobbies.  No thoughts of hurting himself or others.  Offered him support in the form of counseling if he desires, he declined at this time.

## 2019-03-15 NOTE — Assessment & Plan Note (Signed)
Status post bypass distal femoral to SFA.  Status post right great toe amputation.  Doing well from the standpoint.  Will consider problem resolved.

## 2019-03-30 ENCOUNTER — Ambulatory Visit: Payer: PPO | Attending: Internal Medicine

## 2019-03-30 DIAGNOSIS — Z23 Encounter for immunization: Secondary | ICD-10-CM | POA: Insufficient documentation

## 2019-03-30 NOTE — Progress Notes (Signed)
   Covid-19 Vaccination Clinic  Name:  [name]    MRN: [id number] DOB: 1933-12-14  03/30/2019  [name] was observed post Covid-19 immunization for 15 minutes without incidence. He was provided with Vaccine Information Sheet and instruction to access the V-Safe system.   [name] was instructed to call 911 with any severe reactions post vaccine: Marland Kitchen Difficulty breathing  . Swelling of your face and throat  . A fast heartbeat  . A bad rash all over your body  . Dizziness and weakness    Immunizations Administered    Name Date Dose VIS Date Route   Pfizer COVID-19 Vaccine 03/30/2019 11:06 AM 0.3 mL 02/15/2019 Intramuscular   Manufacturer: [place]   Lot: [id number]   [place]: [id number]

## 2019-04-03 DIAGNOSIS — I739 Peripheral vascular disease, unspecified: Secondary | ICD-10-CM | POA: Diagnosis not present

## 2019-04-03 DIAGNOSIS — Z95820 Peripheral vascular angioplasty status with implants and grafts: Secondary | ICD-10-CM | POA: Diagnosis not present

## 2019-04-05 ENCOUNTER — Ambulatory Visit (INDEPENDENT_AMBULATORY_CARE_PROVIDER_SITE_OTHER): Payer: PPO | Admitting: Family Medicine

## 2019-04-05 ENCOUNTER — Other Ambulatory Visit: Payer: Self-pay

## 2019-04-05 VITALS — BP 142/80 | HR 98 | Wt 184.2 lb

## 2019-04-05 DIAGNOSIS — M170 Bilateral primary osteoarthritis of knee: Secondary | ICD-10-CM | POA: Diagnosis not present

## 2019-04-05 MED ORDER — METHYLPREDNISOLONE ACETATE 40 MG/ML IJ SUSP
40.0000 mg | Freq: Once | INTRAMUSCULAR | Status: AC
  Administered 2019-04-05: 40 mg via INTRA_ARTICULAR

## 2019-04-05 NOTE — Patient Instructions (Signed)
Great seeing you again today!  I gave you an injection in both knees.  These will typically last out about 3 months.  If you find you have a little bit of swelling when you get home you can apply some ice to this area.  Please keep a close eye on that bleeding from your nose.  That is not a big deal but if you have bleeding in your stool, urine, or you are coughing up blood that is a bigger deal.

## 2019-04-06 ENCOUNTER — Other Ambulatory Visit: Payer: Self-pay | Admitting: Family Medicine

## 2019-04-06 DIAGNOSIS — J449 Chronic obstructive pulmonary disease, unspecified: Secondary | ICD-10-CM

## 2019-04-08 NOTE — Progress Notes (Signed)
Family medicine procedure note Procedure: Bilateral knee corticosteroid injection Preprocedure diagnosis: Bilateral knee osteoarthritis Post procedure diagnosis: Bilateral knee osteoarthritis Performing physician: Dr. Guadalupe Dawn Supervising physician: Dr. Dorris Singh  Procedure Risks benefits and alternatives to knee corticosteroid injection discussed with patient.  After discussing these, informed consent was obtained.  This was scanned into the chart.  Appropriate anatomic landmarks were palpated.  Bilateral injection areas disinfected with iodine swab x2, alcohol swab x1 in typical location for a lateral approach in bilateral knees.  Cooling menthol spray was used to anesthetize the area.  A mixture of 4 mL lidocaine, 40 mg Depo-Medrol was injected using a lateral approach.  In both knees the needle slid in easily with no issues.  A very minor amount of bleeding was noted after removing the needle from the right knee.  This was expected as the patient is on both Xarelto and aspirin secondary to peripheral vascular disease.  Pressure was held for around 2 minutes with hemostasis noted.  A small bandage was placed over this area.  No such bleeding noted from the left knee, bandage placed in a similar fashion after hemostasis was found to be satisfactory.  Patient noted immediate improvement of his pain, and was able to walk out of the exam room with no issues.  Guadalupe Dawn MD PGY-3 Family Medicine Resident

## 2019-04-08 NOTE — Progress Notes (Signed)
   CHIEF COMPLAINT / HPI: 86 year old male who presents for bilateral knee pain. He states that he has been trying to be more active recently but that his knees have been bothering him a good bit. They both bother him primarily when he is walking, walking up/down stairs, and in the action of standing up/sitting down. He has gotten bilateral knee injections in the past and have given him significant symptomatic benefit. He feels that his pain is very similar to what it has been in the past. He is requesting knee injections.Marland Kitchen  PERTINENT  PMH / PSH: Bilateral knee osteoarthritis   OBJECTIVE: BP (!) 142/80   Pulse 98   Wt 184 lb 3.2 oz (83.6 kg)   SpO2 95%   BMI 25.69 kg/m   Gen: 86 year old [name], no acute distress CV: mild tachycardia, no m/r/g.  Resp: Lungs clear to auscultation bilaterally, no accessory muscle use Neuro: Alert and oriented, Speech clear, No gross deficits  Bilateral knees: Marked crepitus with flexion-extension noted.  Mild tenderness to palpation on lateral and medial aspects of the knee.  No tenderness to palpation over patellar tendon.  ASSESSMENT / PLAN:  Osteoarthritis of both knees Gave bilateral knee corticosteroid injections.  Patient tolerated well, noted some immediate resolution in his pain and was able to walk out without any issues.  Please see attached procedure note for more detail.     Guadalupe Dawn MD PGY-3 Family Medicine Resident [place]

## 2019-04-08 NOTE — Assessment & Plan Note (Signed)
Gave bilateral knee corticosteroid injections.  Patient tolerated well, noted some immediate resolution in his pain and was able to walk out without any issues.  Please see attached procedure note for more detail.

## 2019-04-10 DIAGNOSIS — B351 Tinea unguium: Secondary | ICD-10-CM | POA: Diagnosis not present

## 2019-04-10 DIAGNOSIS — I739 Peripheral vascular disease, unspecified: Secondary | ICD-10-CM | POA: Diagnosis not present

## 2019-04-10 DIAGNOSIS — Z89431 Acquired absence of right foot: Secondary | ICD-10-CM | POA: Diagnosis not present

## 2019-04-20 ENCOUNTER — Ambulatory Visit: Payer: PPO | Attending: Internal Medicine

## 2019-04-20 DIAGNOSIS — Z23 Encounter for immunization: Secondary | ICD-10-CM

## 2019-04-20 NOTE — Progress Notes (Signed)
   Covid-19 Vaccination Clinic  Name:  [name]    MRN: [id number] DOB: 02/09/1934  04/20/2019  [name] was observed post Covid-19 immunization for 15 minutes without incidence. He was provided with Vaccine Information Sheet and instruction to access the V-Safe system.   [name] was instructed to call 911 with any severe reactions post vaccine: Marland Kitchen Difficulty breathing  . Swelling of your face and throat  . A fast heartbeat  . A bad rash all over your body  . Dizziness and weakness    Immunizations Administered    Name Date Dose VIS Date Route   Pfizer COVID-19 Vaccine 04/20/2019  1:38 PM 0.3 mL 02/15/2019 Intramuscular   Manufacturer: [place]   Lot: [id number]   [place]: [id number]

## 2019-05-05 ENCOUNTER — Other Ambulatory Visit: Payer: Self-pay | Admitting: Family Medicine

## 2019-05-05 DIAGNOSIS — J302 Other seasonal allergic rhinitis: Secondary | ICD-10-CM

## 2019-05-23 ENCOUNTER — Other Ambulatory Visit: Payer: Self-pay | Admitting: Family Medicine

## 2019-06-21 DIAGNOSIS — L97511 Non-pressure chronic ulcer of other part of right foot limited to breakdown of skin: Secondary | ICD-10-CM | POA: Diagnosis not present

## 2019-06-21 DIAGNOSIS — I739 Peripheral vascular disease, unspecified: Secondary | ICD-10-CM | POA: Diagnosis not present

## 2019-06-21 DIAGNOSIS — Z89431 Acquired absence of right foot: Secondary | ICD-10-CM | POA: Diagnosis not present

## 2019-07-05 DIAGNOSIS — L97514 Non-pressure chronic ulcer of other part of right foot with necrosis of bone: Secondary | ICD-10-CM | POA: Diagnosis not present

## 2019-07-05 DIAGNOSIS — I739 Peripheral vascular disease, unspecified: Secondary | ICD-10-CM | POA: Diagnosis not present

## 2019-07-05 DIAGNOSIS — Z89431 Acquired absence of right foot: Secondary | ICD-10-CM | POA: Diagnosis not present

## 2019-07-09 ENCOUNTER — Ambulatory Visit (INDEPENDENT_AMBULATORY_CARE_PROVIDER_SITE_OTHER): Payer: PPO | Admitting: Family Medicine

## 2019-07-09 ENCOUNTER — Encounter: Payer: Self-pay | Admitting: Family Medicine

## 2019-07-09 ENCOUNTER — Other Ambulatory Visit: Payer: Self-pay

## 2019-07-09 VITALS — BP 126/84 | HR 72 | Wt 185.0 lb

## 2019-07-09 DIAGNOSIS — E1140 Type 2 diabetes mellitus with diabetic neuropathy, unspecified: Secondary | ICD-10-CM

## 2019-07-09 DIAGNOSIS — E1165 Type 2 diabetes mellitus with hyperglycemia: Secondary | ICD-10-CM | POA: Diagnosis not present

## 2019-07-09 DIAGNOSIS — R5383 Other fatigue: Secondary | ICD-10-CM

## 2019-07-09 DIAGNOSIS — Z89431 Acquired absence of right foot: Secondary | ICD-10-CM | POA: Insufficient documentation

## 2019-07-09 DIAGNOSIS — IMO0002 Reserved for concepts with insufficient information to code with codable children: Secondary | ICD-10-CM

## 2019-07-09 LAB — POCT GLYCOSYLATED HEMOGLOBIN (HGB A1C): HbA1c, POC (controlled diabetic range): 7.5 % — AB (ref 0.0–7.0)

## 2019-07-09 NOTE — Assessment & Plan Note (Signed)
Assessment: A1C today of 7.5. At goal for patient given age. Diet controlled at this time.  Plan:  - Continue with no changes in/addition of medication at this time as patient is at goal for 86yo - Recheck A1C in 6 months

## 2019-07-09 NOTE — Assessment & Plan Note (Signed)
Assessment: Fatigue and elderly male which she states is been occurring for 4-5 years, he recently lost his wife in December.  PHQ 2 score of 1 today, patient denies depression but states that he obviously was depressed after his wife passed.  Patient states he is uninterested in having lab work today but would like to investigate further once his procedures completed in 2-3 weeks. Plan: -Instructed patient to make a follow-up appointment for 2-3 weeks once his procedure is done to further investigate causes of his fatigue including TSH, B12, BMP -Discussed with patient that 1 cause of his fatigue could be depression, though he screened a score of 1 on the PHQ 2 today.  Discussed with patient that if this is determined to be a possible cause this can be treated successfully with medications, patient expressed that he may be interested in discussing these further once his procedure is completed.

## 2019-07-09 NOTE — Progress Notes (Signed)
Subjective:    Patient ID: [name], adult    DOB: Apr 22, 1933, 86 y.o.   MRN: [id number]   CC: A1C Check/follow up  HPI:  [name] is a very pleasant 86 year old male the presents today for an A1c check as well as overall follow-up prior to outpatient surgery. There was some question he states as to whether he had diabetes or not and he wanted to check his A1C prior to his toe amputation surgery coming up.  Fatigue  Has had some increased fatigue over last 4-32yrs. Wife passed last December. Takes a vitamin every day and eats 2 meals a day.  When asked how patient spends this time he states he does not have a lot of family and with the COVID-19 pandemic he is limited in being able to see people.  He states also he has this procedure coming up which is limited his ability to do things he enjoys.  He denies feeling depressed at this point but states that he did feel really down once his wife passed several months ago.  When asked if he would be interested in having lab work done today to try to determine the cause of his fatigue he states that he would like to get his foot procedure completed first and then follow back up in a few weeks.   ROS: pertinent noted in the HPI   Pertinent PMH, PSH, FH, SoHx: History of type 2 diabetes, plan for toe amputation  Smoking status - Former smoker  Objective:  BP 126/84   Pulse 72   Wt 185 lb (83.9 kg)   BMI 25.80 kg/m   Vitals and nursing note reviewed  Diabetic Foot Exam - Simple   Simple Foot Form Visual Inspection See comments: Yes Sensation Testing See comments: Yes Pulse Check Comments Right first toe surgically removed with well-healed scar, right second toe bandaged up.  Fine touch sensation decreased up to midway through the calf bilaterally.    General: NAD, pleasant, able to participate in exam Cardiac: S1 S2 present. No murmurs. Respiratory: CTAB, normal effort Psych: Normal affect and mood   Assessment &  Plan:    Type 2 diabetes mellitus with peripheral neuropathy (HCC) Assessment: A1C today of 7.5. At goal for patient given age. Diet controlled at this time.  Plan:  - Continue with no changes in/addition of medication at this time as patient is at goal for 85yo - Recheck A1C in 6 months  Fatigue Assessment: Fatigue and elderly male which she states is been occurring for 4-5 years, he recently lost his wife in December.  PHQ 2 score of 1 today, patient denies depression but states that he obviously was depressed after his wife passed.  Patient states he is uninterested in having lab work today but would like to investigate further once his procedures completed in 2-3 weeks. Plan: -Instructed patient to make a follow-up appointment for 2-3 weeks once his procedure is done to further investigate causes of his fatigue including TSH, B12, BMP -Discussed with patient that 1 cause of his fatigue could be depression, though he screened a score of 1 on the PHQ 2 today.  Discussed with patient that if this is determined to be a possible cause this can be treated successfully with medications, patient expressed that he may be interested in discussing these further once his procedure is completed.  Patient states he does not wish to have any blood work today but would be interested in having this in the  future once his procedure for his toe amputation is complete.   Lurline Del, [place] [facility] PGY-1

## 2019-07-09 NOTE — Patient Instructions (Signed)
It was great to see you!  Our plans for today:  - Your A1C today is 7.5, this is at goal so we do not need to add medications at this time - Once you have your procedure we would like to see you back to have some blood work and discuss possible causes of your lack of energy.   Take care and seek immediate care sooner if you develop any concerns.   Dr. Gentry [facility]

## 2019-07-12 DIAGNOSIS — L97514 Non-pressure chronic ulcer of other part of right foot with necrosis of bone: Secondary | ICD-10-CM | POA: Diagnosis not present

## 2019-07-12 DIAGNOSIS — M726 Necrotizing fasciitis: Secondary | ICD-10-CM | POA: Diagnosis not present

## 2019-07-12 DIAGNOSIS — Z89431 Acquired absence of right foot: Secondary | ICD-10-CM | POA: Diagnosis not present

## 2019-07-12 DIAGNOSIS — I739 Peripheral vascular disease, unspecified: Secondary | ICD-10-CM | POA: Diagnosis not present

## 2019-07-12 DIAGNOSIS — M86171 Other acute osteomyelitis, right ankle and foot: Secondary | ICD-10-CM | POA: Diagnosis not present

## 2019-07-12 DIAGNOSIS — L98499 Non-pressure chronic ulcer of skin of other sites with unspecified severity: Secondary | ICD-10-CM | POA: Diagnosis not present

## 2019-07-12 DIAGNOSIS — Z89421 Acquired absence of other right toe(s): Secondary | ICD-10-CM | POA: Diagnosis not present

## 2019-07-16 ENCOUNTER — Other Ambulatory Visit: Payer: Self-pay | Admitting: Family Medicine

## 2019-07-19 DIAGNOSIS — Z89431 Acquired absence of right foot: Secondary | ICD-10-CM | POA: Diagnosis not present

## 2019-07-19 DIAGNOSIS — I739 Peripheral vascular disease, unspecified: Secondary | ICD-10-CM | POA: Diagnosis not present

## 2019-08-02 ENCOUNTER — Other Ambulatory Visit: Payer: Self-pay | Admitting: Family Medicine

## 2019-08-02 DIAGNOSIS — Z89431 Acquired absence of right foot: Secondary | ICD-10-CM | POA: Diagnosis not present

## 2019-08-02 DIAGNOSIS — I739 Peripheral vascular disease, unspecified: Secondary | ICD-10-CM | POA: Diagnosis not present

## 2019-08-02 DIAGNOSIS — J302 Other seasonal allergic rhinitis: Secondary | ICD-10-CM

## 2019-08-02 DIAGNOSIS — L97512 Non-pressure chronic ulcer of other part of right foot with fat layer exposed: Secondary | ICD-10-CM | POA: Diagnosis not present

## 2019-08-02 DIAGNOSIS — J449 Chronic obstructive pulmonary disease, unspecified: Secondary | ICD-10-CM

## 2019-08-08 DIAGNOSIS — T82898A Other specified complication of vascular prosthetic devices, implants and grafts, initial encounter: Secondary | ICD-10-CM | POA: Diagnosis not present

## 2019-08-08 DIAGNOSIS — I70221 Atherosclerosis of native arteries of extremities with rest pain, right leg: Secondary | ICD-10-CM | POA: Diagnosis not present

## 2019-08-08 DIAGNOSIS — Z89411 Acquired absence of right great toe: Secondary | ICD-10-CM | POA: Diagnosis not present

## 2019-08-08 DIAGNOSIS — I739 Peripheral vascular disease, unspecified: Secondary | ICD-10-CM | POA: Diagnosis not present

## 2019-08-08 DIAGNOSIS — E1159 Type 2 diabetes mellitus with other circulatory complications: Secondary | ICD-10-CM | POA: Diagnosis not present

## 2019-08-08 DIAGNOSIS — Z7982 Long term (current) use of aspirin: Secondary | ICD-10-CM | POA: Diagnosis not present

## 2019-08-08 DIAGNOSIS — L97514 Non-pressure chronic ulcer of other part of right foot with necrosis of bone: Secondary | ICD-10-CM | POA: Diagnosis not present

## 2019-08-08 DIAGNOSIS — E785 Hyperlipidemia, unspecified: Secondary | ICD-10-CM | POA: Diagnosis not present

## 2019-08-21 DIAGNOSIS — L97519 Non-pressure chronic ulcer of other part of right foot with unspecified severity: Secondary | ICD-10-CM | POA: Diagnosis not present

## 2019-08-29 DIAGNOSIS — L97519 Non-pressure chronic ulcer of other part of right foot with unspecified severity: Secondary | ICD-10-CM | POA: Diagnosis not present

## 2019-08-29 DIAGNOSIS — I739 Peripheral vascular disease, unspecified: Secondary | ICD-10-CM | POA: Diagnosis not present

## 2019-08-29 DIAGNOSIS — L97514 Non-pressure chronic ulcer of other part of right foot with necrosis of bone: Secondary | ICD-10-CM | POA: Diagnosis not present

## 2019-08-29 DIAGNOSIS — E11621 Type 2 diabetes mellitus with foot ulcer: Secondary | ICD-10-CM | POA: Diagnosis not present

## 2019-09-03 DIAGNOSIS — E11621 Type 2 diabetes mellitus with foot ulcer: Secondary | ICD-10-CM | POA: Diagnosis not present

## 2019-09-03 DIAGNOSIS — L97514 Non-pressure chronic ulcer of other part of right foot with necrosis of bone: Secondary | ICD-10-CM | POA: Diagnosis not present

## 2019-09-06 DIAGNOSIS — E11621 Type 2 diabetes mellitus with foot ulcer: Secondary | ICD-10-CM | POA: Diagnosis not present

## 2019-09-06 DIAGNOSIS — E875 Hyperkalemia: Secondary | ICD-10-CM | POA: Diagnosis not present

## 2019-09-06 DIAGNOSIS — M5126 Other intervertebral disc displacement, lumbar region: Secondary | ICD-10-CM | POA: Diagnosis not present

## 2019-09-06 DIAGNOSIS — L97514 Non-pressure chronic ulcer of other part of right foot with necrosis of bone: Secondary | ICD-10-CM | POA: Diagnosis not present

## 2019-09-06 DIAGNOSIS — J9811 Atelectasis: Secondary | ICD-10-CM | POA: Diagnosis not present

## 2019-09-06 DIAGNOSIS — I739 Peripheral vascular disease, unspecified: Secondary | ICD-10-CM | POA: Diagnosis not present

## 2019-09-06 DIAGNOSIS — R918 Other nonspecific abnormal finding of lung field: Secondary | ICD-10-CM | POA: Diagnosis not present

## 2019-09-06 DIAGNOSIS — I444 Left anterior fascicular block: Secondary | ICD-10-CM | POA: Diagnosis not present

## 2019-09-06 DIAGNOSIS — I493 Ventricular premature depolarization: Secondary | ICD-10-CM | POA: Diagnosis not present

## 2019-09-06 DIAGNOSIS — L97512 Non-pressure chronic ulcer of other part of right foot with fat layer exposed: Secondary | ICD-10-CM | POA: Diagnosis not present

## 2019-09-13 DIAGNOSIS — I739 Peripheral vascular disease, unspecified: Secondary | ICD-10-CM | POA: Diagnosis not present

## 2019-09-13 DIAGNOSIS — E11621 Type 2 diabetes mellitus with foot ulcer: Secondary | ICD-10-CM | POA: Diagnosis not present

## 2019-09-13 DIAGNOSIS — L97512 Non-pressure chronic ulcer of other part of right foot with fat layer exposed: Secondary | ICD-10-CM | POA: Diagnosis not present

## 2019-09-13 DIAGNOSIS — L97514 Non-pressure chronic ulcer of other part of right foot with necrosis of bone: Secondary | ICD-10-CM | POA: Diagnosis not present

## 2019-09-19 DIAGNOSIS — I700 Atherosclerosis of aorta: Secondary | ICD-10-CM | POA: Diagnosis not present

## 2019-09-19 DIAGNOSIS — J8410 Pulmonary fibrosis, unspecified: Secondary | ICD-10-CM | POA: Diagnosis not present

## 2019-09-19 DIAGNOSIS — R918 Other nonspecific abnormal finding of lung field: Secondary | ICD-10-CM | POA: Diagnosis not present

## 2019-09-19 DIAGNOSIS — I2510 Atherosclerotic heart disease of native coronary artery without angina pectoris: Secondary | ICD-10-CM | POA: Diagnosis not present

## 2019-09-19 DIAGNOSIS — J439 Emphysema, unspecified: Secondary | ICD-10-CM | POA: Diagnosis not present

## 2019-09-20 DIAGNOSIS — L97514 Non-pressure chronic ulcer of other part of right foot with necrosis of bone: Secondary | ICD-10-CM | POA: Diagnosis not present

## 2019-09-20 DIAGNOSIS — I739 Peripheral vascular disease, unspecified: Secondary | ICD-10-CM | POA: Diagnosis not present

## 2019-09-20 DIAGNOSIS — E11621 Type 2 diabetes mellitus with foot ulcer: Secondary | ICD-10-CM | POA: Diagnosis not present

## 2019-09-23 DIAGNOSIS — L97514 Non-pressure chronic ulcer of other part of right foot with necrosis of bone: Secondary | ICD-10-CM | POA: Diagnosis not present

## 2019-09-26 DIAGNOSIS — M869 Osteomyelitis, unspecified: Secondary | ICD-10-CM | POA: Diagnosis not present

## 2019-09-26 DIAGNOSIS — L97514 Non-pressure chronic ulcer of other part of right foot with necrosis of bone: Secondary | ICD-10-CM | POA: Diagnosis not present

## 2019-09-26 DIAGNOSIS — M86171 Other acute osteomyelitis, right ankle and foot: Secondary | ICD-10-CM | POA: Diagnosis not present

## 2019-09-26 DIAGNOSIS — B9561 Methicillin susceptible Staphylococcus aureus infection as the cause of diseases classified elsewhere: Secondary | ICD-10-CM | POA: Diagnosis not present

## 2019-09-26 DIAGNOSIS — B9689 Other specified bacterial agents as the cause of diseases classified elsewhere: Secondary | ICD-10-CM | POA: Diagnosis not present

## 2019-09-26 DIAGNOSIS — I739 Peripheral vascular disease, unspecified: Secondary | ICD-10-CM | POA: Diagnosis not present

## 2019-09-26 DIAGNOSIS — E11621 Type 2 diabetes mellitus with foot ulcer: Secondary | ICD-10-CM | POA: Diagnosis not present

## 2019-10-03 DIAGNOSIS — M86171 Other acute osteomyelitis, right ankle and foot: Secondary | ICD-10-CM | POA: Diagnosis not present

## 2019-10-03 DIAGNOSIS — E1169 Type 2 diabetes mellitus with other specified complication: Secondary | ICD-10-CM | POA: Diagnosis not present

## 2019-10-03 DIAGNOSIS — E11621 Type 2 diabetes mellitus with foot ulcer: Secondary | ICD-10-CM | POA: Diagnosis not present

## 2019-10-03 DIAGNOSIS — L97514 Non-pressure chronic ulcer of other part of right foot with necrosis of bone: Secondary | ICD-10-CM | POA: Diagnosis not present

## 2019-10-03 DIAGNOSIS — L97512 Non-pressure chronic ulcer of other part of right foot with fat layer exposed: Secondary | ICD-10-CM | POA: Diagnosis not present

## 2019-10-03 DIAGNOSIS — I739 Peripheral vascular disease, unspecified: Secondary | ICD-10-CM | POA: Diagnosis not present

## 2019-10-13 ENCOUNTER — Other Ambulatory Visit: Payer: Self-pay | Admitting: Family Medicine

## 2019-10-18 DIAGNOSIS — L97514 Non-pressure chronic ulcer of other part of right foot with necrosis of bone: Secondary | ICD-10-CM | POA: Diagnosis not present

## 2019-10-18 DIAGNOSIS — E11621 Type 2 diabetes mellitus with foot ulcer: Secondary | ICD-10-CM | POA: Diagnosis not present

## 2019-10-18 DIAGNOSIS — Z89419 Acquired absence of unspecified great toe: Secondary | ICD-10-CM | POA: Diagnosis not present

## 2019-10-18 DIAGNOSIS — M868X7 Other osteomyelitis, ankle and foot: Secondary | ICD-10-CM | POA: Diagnosis not present

## 2019-10-18 DIAGNOSIS — L97512 Non-pressure chronic ulcer of other part of right foot with fat layer exposed: Secondary | ICD-10-CM | POA: Diagnosis not present

## 2019-10-25 ENCOUNTER — Other Ambulatory Visit: Payer: Self-pay | Admitting: Family Medicine

## 2019-10-25 DIAGNOSIS — J302 Other seasonal allergic rhinitis: Secondary | ICD-10-CM

## 2019-11-01 DIAGNOSIS — E11621 Type 2 diabetes mellitus with foot ulcer: Secondary | ICD-10-CM | POA: Diagnosis not present

## 2019-11-01 DIAGNOSIS — M869 Osteomyelitis, unspecified: Secondary | ICD-10-CM | POA: Diagnosis not present

## 2019-11-01 DIAGNOSIS — L97514 Non-pressure chronic ulcer of other part of right foot with necrosis of bone: Secondary | ICD-10-CM | POA: Diagnosis not present

## 2019-11-01 DIAGNOSIS — L97512 Non-pressure chronic ulcer of other part of right foot with fat layer exposed: Secondary | ICD-10-CM | POA: Diagnosis not present

## 2019-11-07 DIAGNOSIS — L84 Corns and callosities: Secondary | ICD-10-CM | POA: Diagnosis not present

## 2019-11-07 DIAGNOSIS — Z89421 Acquired absence of other right toe(s): Secondary | ICD-10-CM | POA: Diagnosis not present

## 2019-11-07 DIAGNOSIS — E119 Type 2 diabetes mellitus without complications: Secondary | ICD-10-CM | POA: Diagnosis not present

## 2019-11-08 DIAGNOSIS — L97512 Non-pressure chronic ulcer of other part of right foot with fat layer exposed: Secondary | ICD-10-CM | POA: Diagnosis not present

## 2019-11-08 DIAGNOSIS — E11621 Type 2 diabetes mellitus with foot ulcer: Secondary | ICD-10-CM | POA: Diagnosis not present

## 2019-11-19 DIAGNOSIS — E11621 Type 2 diabetes mellitus with foot ulcer: Secondary | ICD-10-CM | POA: Diagnosis not present

## 2019-11-19 DIAGNOSIS — I739 Peripheral vascular disease, unspecified: Secondary | ICD-10-CM | POA: Diagnosis not present

## 2019-11-19 DIAGNOSIS — L97514 Non-pressure chronic ulcer of other part of right foot with necrosis of bone: Secondary | ICD-10-CM | POA: Diagnosis not present

## 2019-11-19 DIAGNOSIS — L97518 Non-pressure chronic ulcer of other part of right foot with other specified severity: Secondary | ICD-10-CM | POA: Diagnosis not present

## 2019-11-27 ENCOUNTER — Other Ambulatory Visit: Payer: Self-pay | Admitting: Family Medicine

## 2019-11-27 DIAGNOSIS — J449 Chronic obstructive pulmonary disease, unspecified: Secondary | ICD-10-CM

## 2019-11-29 ENCOUNTER — Other Ambulatory Visit: Payer: Self-pay | Admitting: Family Medicine

## 2019-12-20 ENCOUNTER — Ambulatory Visit (INDEPENDENT_AMBULATORY_CARE_PROVIDER_SITE_OTHER): Payer: PPO | Admitting: Family Medicine

## 2019-12-20 ENCOUNTER — Other Ambulatory Visit: Payer: Self-pay

## 2019-12-20 ENCOUNTER — Encounter: Payer: Self-pay | Admitting: Family Medicine

## 2019-12-20 VITALS — BP 140/82 | HR 105 | Ht 71.0 in | Wt 188.0 lb

## 2019-12-20 DIAGNOSIS — E1140 Type 2 diabetes mellitus with diabetic neuropathy, unspecified: Secondary | ICD-10-CM

## 2019-12-20 DIAGNOSIS — E1165 Type 2 diabetes mellitus with hyperglycemia: Secondary | ICD-10-CM | POA: Diagnosis not present

## 2019-12-20 DIAGNOSIS — Z23 Encounter for immunization: Secondary | ICD-10-CM | POA: Diagnosis not present

## 2019-12-20 DIAGNOSIS — M170 Bilateral primary osteoarthritis of knee: Secondary | ICD-10-CM

## 2019-12-20 DIAGNOSIS — IMO0002 Reserved for concepts with insufficient information to code with codable children: Secondary | ICD-10-CM

## 2019-12-20 LAB — POCT GLYCOSYLATED HEMOGLOBIN (HGB A1C): HbA1c, POC (controlled diabetic range): 7.1 % — AB (ref 0.0–7.0)

## 2019-12-20 MED ORDER — METHYLPREDNISOLONE ACETATE 40 MG/ML IJ SUSP
40.0000 mg | Freq: Once | INTRAMUSCULAR | Status: AC
  Administered 2019-12-20: 40 mg via INTRAMUSCULAR

## 2019-12-20 NOTE — Progress Notes (Signed)
    SUBJECTIVE:   CHIEF COMPLAINT / HPI:   Patient is here for bilateral knee injections. He states his knees hurt him the most when he goes to a sitting or standing position. He states he doesn't walk much but is hoping to be able to be more active by walking or swimming. Endorses using steroid injections for pain relief for the past several years and typically gets relief for about a month following the injection.   PERTINENT  PMH / PSH:  Osteoarthritis of both knees  OBJECTIVE:   BP 140/82   Pulse (!) 105   Ht 5\' 11"  (1.803 m)   Wt 188 lb (85.3 kg)   SpO2 95%   BMI 26.22 kg/m    Gen: 86 yo [name], NAD CV: Mild tachycardia. No murmurs Resp: Lungs CTAB. Normal WOB MSK: No erythema, warmth, or edema. Non tender to palpation. Crepitus with flexion/extension. Pain with L knee valgus/varus force    ASSESSMENT/PLAN:   No problem-specific Assessment & Plan notes found for this encounter.  Osteoarthritis of both knees Performed bilateral corticosteroid knee injections. Patient tolerated procedure well and was able to walk without any issues. See procedure note below:  Procedure note  Procedure: Bilateral lower extremity knee injections Performing physician: Dr. Sonia Side Supervising physician: Dr. Dorcas Mcmurray  Pre-procedure diagnosis: Bilateral lower extremity knee osteoarthritis Procedure diagnosis: Bilateral lower extremity knee osteoarthritis  Prior to procedure. Risks and benefits were explained, and informed consent was obtained   Procedure: Patient sitting on exam table, with bilateral knees exposed.  Knees observed, no abnormal anatomy found.  Patient was turned to the left knee.  Elta [place] was placed just medial to the patella to signify the injection site.  Area cleaned thoroughly with iodine, and alcohol swab.  Area sprayed with topical lidocaine spray, for anesthetic.  5 mL of lidocaine and Depo-Medrol mixture (4 and 1) injected into left knee medial space. There  was no blood loss and Band-Aid placed over area.  Attention turned to right knee.  Marker placed in the sterilized in same fashion as above.  5 mL of lidocaine and Depo-Medrol mixture injected into similar space of the right knee.  Again no bleeding was observed, and Band-Aid placed. Patient tolerated procedure well without complications.  Healthcare maintenance A1C completed in the clinic today which was 7.1 (down from 7.5 in May). He also received his COVID booster vaccine  - recommended patient return in about a month to take care of all his other health maintenance items.   [place]

## 2019-12-20 NOTE — Patient Instructions (Addendum)
It was great seeing you today! Today you received your COVID booster, checked your blood sugar, and we did bilateral knee injections. I hope this helps with some of the knee pain you've been having.   Please check-out at the front desk before leaving the clinic. I'd like to see you back in about a month for your physical exam, but if you need to be seen earlier than that for any new issues we're happy to fit you in, just give [place] a call!  If you haven't already, sign up for My Chart to have easy access to your labs results, and communication with your primary care physician.  Feel free to call with any questions or concerns at any time, at [phone].   Take care,  Dr. Shary Key [facility]  `

## 2020-01-07 ENCOUNTER — Other Ambulatory Visit: Payer: Self-pay | Admitting: Family Medicine

## 2020-01-17 ENCOUNTER — Other Ambulatory Visit: Payer: Self-pay | Admitting: Family Medicine

## 2020-01-17 DIAGNOSIS — J302 Other seasonal allergic rhinitis: Secondary | ICD-10-CM

## 2020-01-22 ENCOUNTER — Ambulatory Visit (INDEPENDENT_AMBULATORY_CARE_PROVIDER_SITE_OTHER): Payer: PPO | Admitting: Family Medicine

## 2020-01-22 ENCOUNTER — Other Ambulatory Visit: Payer: Self-pay

## 2020-01-22 VITALS — BP 136/82 | HR 113 | Wt 187.4 lb

## 2020-01-22 DIAGNOSIS — E1142 Type 2 diabetes mellitus with diabetic polyneuropathy: Secondary | ICD-10-CM

## 2020-01-22 MED ORDER — ATORVASTATIN CALCIUM 20 MG PO TABS
20.0000 mg | ORAL_TABLET | Freq: Every day | ORAL | 3 refills | Status: DC
Start: 2020-01-22 — End: 2020-05-10

## 2020-01-22 MED ORDER — ALBUTEROL SULFATE (2.5 MG/3ML) 0.083% IN NEBU: 2.5000 mg | INHALATION_SOLUTION | Freq: Four times a day (QID) | RESPIRATORY_TRACT | 1 refills | Status: DC | PRN

## 2020-01-22 NOTE — Progress Notes (Signed)
    SUBJECTIVE:   CHIEF COMPLAINT / HPI:   Patient requests for his toe nails to be cut. He denies any pain. He states he has been nervous to cut them himself because he lost his previous toes due to infection from cutting them   Patient states he has been having a cough at night that is releived with vicks vaporizor and asks if it is ok for him to use. Patient requests a refill of his albuterol nebulizer and a refill of Lipitor   Denies smoking tobacco stating it has been over 15 years since he quit  Denies exercise because his body hurts. He also reports being frequently tired.    PERTINENT  PMH / PSH:  Critical lower limb ischemia, DM2, HTN, arthritis, gout   OBJECTIVE:   BP 136/82   Pulse (!) 113   Wt 187 lb 6.4 oz (85 kg)   SpO2 96%   BMI 26.14 kg/m    General: well appearing, appears stated age, NAD Respiratory: CTAB. Normal WOB CV: RRR no murmurs Abdomen: soft, non-distended, non-tender  Extremities: L foot toe nails with increased growth and slightly thickened. Did not appreciate discoloration   ASSESSMENT/PLAN:   No problem-specific Assessment & Plan notes found for this encounter.  Toenail hypertrophy Toenails of left foot long but without signs of infection. I was unable to cut them with our clinic clippers so I referred him to podiatry   Health Maintenance - patient deferred colonoscopy  - patient will make an appt with ophthalmologist he follows  - deferred Tdap and requests to get it later. Sent Rx  - Refilled Albuterol and Lipitor Rx    [place]

## 2020-01-22 NOTE — Patient Instructions (Signed)
It was great seeing you today! I have refilled your albuterol nebulizer and your Lipitor. I have also placed a referral for podiatry to cut your toe nails. They will call you in the next 2 weeks to schedule an appointment, but please call our clinic if you do not hear back in the next 2 weeks. Please make sure you schedule an appointment with your eye doctor as well.   Please check-out at the front desk before leaving the clinic. I'd like to see you back in 3 months to follow up on your diabetes, but if you need to be seen earlier than that for any new issues we're happy to fit you in, just give [place] a call!  Visit Reminders: - Stop by the pharmacy to pick up your prescriptions for your albuterol nebulizer and Lipitor  - Continue to work on your healthy eating habits and incorporating exercise into your daily life. - To Do: Call your eye doctor to schedule your eye appointment     If you haven't already, sign up for My Chart to have easy access to your labs results, and communication with your primary care physician.  Feel free to call with any questions or concerns at any time, at [phone].   Take care,  Dr. Shary Key [facility]

## 2020-02-14 ENCOUNTER — Other Ambulatory Visit: Payer: Self-pay

## 2020-02-14 ENCOUNTER — Ambulatory Visit: Payer: PPO | Admitting: Podiatry

## 2020-02-14 DIAGNOSIS — Z89411 Acquired absence of right great toe: Secondary | ICD-10-CM

## 2020-02-14 DIAGNOSIS — E1151 Type 2 diabetes mellitus with diabetic peripheral angiopathy without gangrene: Secondary | ICD-10-CM | POA: Diagnosis not present

## 2020-02-14 DIAGNOSIS — E1169 Type 2 diabetes mellitus with other specified complication: Secondary | ICD-10-CM | POA: Diagnosis not present

## 2020-02-14 DIAGNOSIS — E119 Type 2 diabetes mellitus without complications: Secondary | ICD-10-CM

## 2020-02-14 DIAGNOSIS — E1142 Type 2 diabetes mellitus with diabetic polyneuropathy: Secondary | ICD-10-CM

## 2020-02-14 DIAGNOSIS — B351 Tinea unguium: Secondary | ICD-10-CM | POA: Diagnosis not present

## 2020-02-14 DIAGNOSIS — Z89421 Acquired absence of other right toe(s): Secondary | ICD-10-CM | POA: Diagnosis not present

## 2020-02-14 MED ORDER — GABAPENTIN 300 MG PO CAPS: ORAL_CAPSULE | ORAL | 3 refills | Status: DC

## 2020-02-14 NOTE — Progress Notes (Signed)
  Subjective:  Patient ID: [name], adult    DOB: 10-22-1933,  MRN: [id number]  Chief Complaint  Patient presents with  . Diabetes    Diabetic foot exam  . Nail Problem    Nail trim   . Peripheral Neuropathy    Pt states painful numbness in feet, diagnosis of neuropathy.    86 y.o. adult presents with the above complaint. History confirmed with patient. Had Right 1st/2nd toes amputated last year due to infection and poor blood flow.  Objective:  Physical Exam: warm, good capillary refill, nail exam onychomycosis of the toenails, no trophic changes or ulcerative lesions. DP pulses palpable, PT pulses palpable and protective sensation absent Left Foot: normal exam, no swelling, tenderness, instability; ligaments intact, full range of motion of all ankle/foot joints  Right Foot: hammertoes noted. R 1st/2nd toe amputations noted. Well healed  No images are attached to the encounter.  Assessment:   1. Onychomycosis of multiple toenails with type 2 diabetes mellitus and peripheral neuropathy ([place])   2. Encounter for diabetic foot exam ([place])   3. History of amputation of right great toe ([place])   4. History of amputation of lesser toe, right ([place])    Plan:  Patient was evaluated and treated and all questions answered.  Onychomycosis, Diabetes and DPN -Patient is diabetic with a qualifying condition for at risk foot care. -Would benefit from DM shoes. Will make appt for fabrication -Educated on DM Footcare.  Procedure: Nail Debridement Type of Debridement: manual, sharp debridement. Instrumentation: Nail nipper, rotary burr. Number of Nails: 10  Return in about 3 months (around 05/14/2020).

## 2020-02-14 NOTE — Patient Instructions (Signed)
Diabetes Mellitus and Foot Care Foot care is an important part of your health, especially when you have diabetes. Diabetes may cause you to have problems because of poor blood flow (circulation) to your feet and legs, which can cause your skin to:  Become thinner and drier.  Break more easily.  Heal more slowly.  Peel and crack. You may also have nerve damage (neuropathy) in your legs and feet, causing decreased feeling in them. This means that you may not notice minor injuries to your feet that could lead to more serious problems. Noticing and addressing any potential problems early is the best way to prevent future foot problems. How to care for your feet Foot hygiene  Wash your feet daily with warm water and mild soap. Do not use hot water. Then, pat your feet and the areas between your toes until they are completely dry. Do not soak your feet as this can dry your skin.  Trim your toenails straight across. Do not dig under them or around the cuticle. File the edges of your nails with an emery board or nail file.  Apply a moisturizing lotion or petroleum jelly to the skin on your feet and to dry, brittle toenails. Use lotion that does not contain alcohol and is unscented. Do not apply lotion between your toes. Shoes and socks  Wear clean socks or stockings every day. Make sure they are not too tight. Do not wear knee-high stockings since they may decrease blood flow to your legs.  Wear shoes that fit properly and have enough cushioning. Always look in your shoes before you put them on to be sure there are no objects inside.  To break in new shoes, wear them for just a few hours a day. This prevents injuries on your feet. Wounds, scrapes, corns, and calluses  Check your feet daily for blisters, cuts, bruises, sores, and redness. If you cannot see the bottom of your feet, use a mirror or ask someone for help.  Do not cut corns or calluses or try to remove them with medicine.  If you  find a minor scrape, cut, or break in the skin on your feet, keep it and the skin around it clean and dry. You may clean these areas with mild soap and water. Do not clean the area with peroxide, alcohol, or iodine.  If you have a wound, scrape, corn, or callus on your foot, look at it several times a day to make sure it is healing and not infected. Check for: ? Redness, swelling, or pain. ? Fluid or blood. ? Warmth. ? Pus or a bad smell. General instructions  Do not cross your legs. This may decrease blood flow to your feet.  Do not use heating pads or hot water bottles on your feet. They may burn your skin. If you have lost feeling in your feet or legs, you may not know this is happening until it is too late.  Protect your feet from hot and cold by wearing shoes, such as at the beach or on hot pavement.  Schedule a complete foot exam at least once a year (annually) or more often if you have foot problems. If you have foot problems, report any cuts, sores, or bruises to your health care provider immediately. Contact a health care provider if:  You have a medical condition that increases your risk of infection and you have any cuts, sores, or bruises on your feet.  You have an injury that is not   healing.  You have redness on your legs or feet.  You feel burning or tingling in your legs or feet.  You have pain or cramps in your legs and feet.  Your legs or feet are numb.  Your feet always feel cold.  You have pain around a toenail. Get help right away if:  You have a wound, scrape, corn, or callus on your foot and: ? You have pain, swelling, or redness that gets worse. ? You have fluid or blood coming from the wound, scrape, corn, or callus. ? Your wound, scrape, corn, or callus feels warm to the touch. ? You have pus or a bad smell coming from the wound, scrape, corn, or callus. ? You have a fever. ? You have a red line going up your leg. Summary  Check your feet every day  for cuts, sores, red spots, swelling, and blisters.  Moisturize feet and legs daily.  Wear shoes that fit properly and have enough cushioning.  If you have foot problems, report any cuts, sores, or bruises to your health care provider immediately.  Schedule a complete foot exam at least once a year (annually) or more often if you have foot problems. This information is not intended to replace advice given to you by your health care provider. Make sure you discuss any questions you have with your health care provider. Document Revised: 11/14/2018 Document Reviewed: 03/25/2016 Elsevier Patient Education  2020 Elsevier Inc.  

## 2020-03-09 ENCOUNTER — Other Ambulatory Visit: Payer: Self-pay | Admitting: Family Medicine

## 2020-03-09 DIAGNOSIS — J302 Other seasonal allergic rhinitis: Secondary | ICD-10-CM

## 2020-03-18 DIAGNOSIS — H524 Presbyopia: Secondary | ICD-10-CM | POA: Diagnosis not present

## 2020-03-18 DIAGNOSIS — H04123 Dry eye syndrome of bilateral lacrimal glands: Secondary | ICD-10-CM | POA: Diagnosis not present

## 2020-03-18 DIAGNOSIS — Z961 Presence of intraocular lens: Secondary | ICD-10-CM | POA: Diagnosis not present

## 2020-03-18 DIAGNOSIS — H5211 Myopia, right eye: Secondary | ICD-10-CM | POA: Diagnosis not present

## 2020-03-18 DIAGNOSIS — H1045 Other chronic allergic conjunctivitis: Secondary | ICD-10-CM | POA: Diagnosis not present

## 2020-03-18 DIAGNOSIS — H52203 Unspecified astigmatism, bilateral: Secondary | ICD-10-CM | POA: Diagnosis not present

## 2020-03-29 ENCOUNTER — Other Ambulatory Visit: Payer: Self-pay | Admitting: Family Medicine

## 2020-03-29 DIAGNOSIS — J449 Chronic obstructive pulmonary disease, unspecified: Secondary | ICD-10-CM

## 2020-04-01 ENCOUNTER — Other Ambulatory Visit: Payer: Self-pay | Admitting: Family Medicine

## 2020-05-07 ENCOUNTER — Other Ambulatory Visit: Payer: Self-pay | Admitting: Family Medicine

## 2020-05-15 ENCOUNTER — Ambulatory Visit: Payer: PPO | Admitting: Podiatry

## 2020-05-15 ENCOUNTER — Other Ambulatory Visit: Payer: Self-pay

## 2020-05-15 DIAGNOSIS — E1140 Type 2 diabetes mellitus with diabetic neuropathy, unspecified: Secondary | ICD-10-CM | POA: Diagnosis not present

## 2020-05-15 DIAGNOSIS — E1151 Type 2 diabetes mellitus with diabetic peripheral angiopathy without gangrene: Secondary | ICD-10-CM

## 2020-05-15 DIAGNOSIS — E1142 Type 2 diabetes mellitus with diabetic polyneuropathy: Secondary | ICD-10-CM

## 2020-05-15 DIAGNOSIS — E1169 Type 2 diabetes mellitus with other specified complication: Secondary | ICD-10-CM | POA: Diagnosis not present

## 2020-05-15 DIAGNOSIS — B351 Tinea unguium: Secondary | ICD-10-CM

## 2020-05-15 MED ORDER — NONFORMULARY OR COMPOUNDED ITEM: 1.0000 g | Freq: Four times a day (QID) | 0 refills | Status: DC

## 2020-06-04 NOTE — Progress Notes (Signed)
  Subjective:  Patient ID: [name], adult    DOB: [birth year],  MRN: [id number]  Chief Complaint  Patient presents with  . Peripheral Neuropathy    Pt states gabapentin '900mg'$  daily is not helping his pain.  . Nail Problem    Nail trim 1-5 bilateral    87 y.o. adult presents with the above complaint. History confirmed with patient.  Objective:  Physical Exam: warm, good capillary refill, nail exam onychomycosis of the toenails, no trophic changes or ulcerative lesions. DP pulses palpable, PT pulses palpable and protective sensation absent Left Foot: normal exam, no swelling, tenderness, instability; ligaments intact, full range of motion of all ankle/foot joints  Right Foot: hammertoes noted. R 1st/2nd toe amputations noted. Well healed  No images are attached to the encounter.  Assessment:   1. Diabetes mellitus type 2 with peripheral artery disease ([place])   2. Onychomycosis of multiple toenails with type 2 diabetes mellitus and peripheral neuropathy ([facility])   3. Painful diabetic neuropathy ([place])    Plan:  Patient was evaluated and treated and all questions answered.  Onychomycosis, Diabetes and DPN -Patient is diabetic with a qualifying condition for at risk foot care.  Procedure: Nail Debridement Type of Debridement: manual, sharp debridement. Instrumentation: Nail nipper, rotary burr. Number of Nails: 8  Painful diabetic neuropathy -We discussed further treatments.  Think it best to trial topical compound cream as he is on a rather high dose of gabapentin.   No follow-ups on file.

## 2020-06-09 ENCOUNTER — Other Ambulatory Visit: Payer: Self-pay | Admitting: Podiatry

## 2020-06-10 NOTE — Telephone Encounter (Signed)
Please advise 

## 2020-06-16 NOTE — Progress Notes (Addendum)
    SUBJECTIVE:   CHIEF COMPLAINT / HPI:   [name] is an 87yo who presents with diffuse body aches. He endorses chronic pain particularly in his upper and lower extremity joints bilaterally. He is also wanting to follow up on his diabetes.   DM2: A1c 7.1 on 12/20/2019.  He was not taking any medication.  States he has been trying to watch his diet, but feels that it has worsened in the last several months due to him staying with his niece and not being as mindful of his intake.  He is also unable to get any physical activity and because of his body pain.  Pt has peripheral neuropathy, hx of right 1st and 5th toe amputations. Saw podiatry on 12/10 who educated on diabetic foot care and made appt for DM shoe  PERTINENT  PMH / PSH:  DM2, Peripheral neuropathy, PAD, HTN, Osteoarthritis   OBJECTIVE:   BP (!) 148/88   Pulse (!) 107   Wt 192 lb 9.6 oz (87.4 kg)   SpO2 94%   BMI 26.86 kg/m    Physical exam  General: well appearing, NAD Cardiovascular: RRR, no murmurs Lungs: CTAB. Normal WOB Abdomen: soft, non-distended, non-tender Skin: warm, dry. No edema MSK: knees without erythema or edema bilaterally. Non tender to palpation.   ASSESSMENT/PLAN:   No problem-specific Assessment & Plan notes found for this encounter.  Diffuse body pain Likely due to osteoarthritis, as his pain is chronic in nature and occurs in both upper and lower extremities bilaterally.  Has previously received corticosteroid injections in the past. -Refill diclofenac gel  DM2   POC A1c today 11.3, up from 7.1 on 12/20/2019.  Denies taking any medication.  Does endorse worsened diet over the last few months since living with his niece. -Start Metformin 500 mg daily -Educated on food intake, limiting sugars and carbohydrates. -BMP to check kidney function -Lipid panel    [place]

## 2020-06-17 ENCOUNTER — Other Ambulatory Visit: Payer: Self-pay

## 2020-06-17 ENCOUNTER — Ambulatory Visit (INDEPENDENT_AMBULATORY_CARE_PROVIDER_SITE_OTHER): Payer: PPO

## 2020-06-17 ENCOUNTER — Encounter: Payer: Self-pay | Admitting: Family Medicine

## 2020-06-17 ENCOUNTER — Ambulatory Visit (INDEPENDENT_AMBULATORY_CARE_PROVIDER_SITE_OTHER): Payer: PPO | Admitting: Family Medicine

## 2020-06-17 VITALS — BP 148/88 | HR 107 | Wt 192.6 lb

## 2020-06-17 DIAGNOSIS — E1142 Type 2 diabetes mellitus with diabetic polyneuropathy: Secondary | ICD-10-CM | POA: Diagnosis not present

## 2020-06-17 DIAGNOSIS — M170 Bilateral primary osteoarthritis of knee: Secondary | ICD-10-CM

## 2020-06-17 DIAGNOSIS — Z23 Encounter for immunization: Secondary | ICD-10-CM

## 2020-06-17 LAB — POCT GLYCOSYLATED HEMOGLOBIN (HGB A1C): HbA1c, POC (controlled diabetic range): 11.3 % — AB (ref 0.0–7.0)

## 2020-06-17 MED ORDER — DICLOFENAC SODIUM 1 % EX GEL: 2.0000 g | Freq: Four times a day (QID) | CUTANEOUS | 0 refills | Status: DC

## 2020-06-17 MED ORDER — TRIAMCINOLONE ACETONIDE 0.1 % EX CREA: 1.0000 "application " | TOPICAL_CREAM | Freq: Two times a day (BID) | CUTANEOUS | 0 refills | Status: DC

## 2020-06-17 NOTE — Progress Notes (Signed)
   Covid-19 Vaccination Clinic  Name:  [name]    MRN: [id number] DOB: May 08, 1933  06/17/2020  [name] was observed post Covid-19 immunization for 15 minutes without incident. He was provided with Vaccine Information Sheet and instruction to access the V-Safe system.   [name] was instructed to call 911 with any severe reactions post vaccine: Marland Kitchen Difficulty breathing  . Swelling of face and throat  . A fast heartbeat  . A bad rash all over body  . Dizziness and weakness

## 2020-06-17 NOTE — Patient Instructions (Signed)
It was great seeing you today! You came in for diffuse pain which is likely due to arthritis. I refilled your Voltaren gel to be used on affected areas up to 4 times a day. I also refilled your triamcinolone cream.    Today we are checking your labs and blood sugar, and I will call you with any abnormal results.  Visit Reminders: - Stop by the pharmacy to pick up your prescriptions  - Continue to work on your healthy eating habits and incorporating exercise into your daily life.    Feel free to call with any questions or concerns at any time, at [phone].   Take care,  Dr. Shary Key [facility]

## 2020-06-18 LAB — LIPID PANEL
Chol/HDL Ratio: 6.4 ratio — ABNORMAL HIGH (ref 0.0–5.0)
Cholesterol, Total: 199 mg/dL (ref 100–199)
HDL: 31 mg/dL — ABNORMAL LOW (ref >39)
LDL Chol Calc (NIH): 66 mg/dL (ref 0–99)
Triglycerides: 663 mg/dL (ref 0–149)
VLDL Cholesterol Cal: 102 mg/dL — ABNORMAL HIGH (ref 5–40)

## 2020-06-18 LAB — CBC
Hematocrit: 44.1 % (ref 37.5–51.0)
Hemoglobin: 14 g/dL (ref 13.0–17.7)
MCH: 25.8 pg — ABNORMAL LOW (ref 26.6–33.0)
MCHC: 31.7 g/dL (ref 31.5–35.7)
MCV: 81 fL (ref 79–97)
Platelets: 196 10*3/uL (ref 150–450)
RBC: 5.42 x10E6/uL (ref 4.14–5.80)
RDW: 16.1 % — ABNORMAL HIGH (ref 11.6–15.4)
WBC: 13.4 10*3/uL — ABNORMAL HIGH (ref 3.4–10.8)

## 2020-06-18 LAB — BASIC METABOLIC PANEL
BUN/Creatinine Ratio: 20 (ref 10–24)
BUN: 33 mg/dL — ABNORMAL HIGH (ref 8–27)
CO2: 12 mmol/L — ABNORMAL LOW (ref 20–29)
Calcium: 9.3 mg/dL (ref 8.6–10.2)
Chloride: 103 mmol/L (ref 96–106)
Creatinine, Ser: 1.65 mg/dL — ABNORMAL HIGH (ref 0.76–1.27)
Glucose: 411 mg/dL — ABNORMAL HIGH (ref 65–99)
Potassium: 5.2 mmol/L (ref 3.5–5.2)
Sodium: 136 mmol/L (ref 134–144)
eGFR: 41 mL/min/{1.73_m2} — ABNORMAL LOW (ref >59)

## 2020-06-18 MED ORDER — METFORMIN HCL ER 500 MG PO TB24: 500.0000 mg | ORAL_TABLET | Freq: Every day | ORAL | 3 refills | Status: DC

## 2020-06-22 ENCOUNTER — Other Ambulatory Visit: Payer: Self-pay | Admitting: Family Medicine

## 2020-06-22 MED ORDER — FENOFIBRATE 50 MG PO CAPS: 50.0000 mg | ORAL_CAPSULE | Freq: Every day | ORAL | 1 refills | Status: DC

## 2020-06-23 ENCOUNTER — Telehealth: Payer: Self-pay

## 2020-06-23 ENCOUNTER — Other Ambulatory Visit: Payer: Self-pay | Admitting: Family Medicine

## 2020-06-23 NOTE — Telephone Encounter (Signed)
Patient calls nurse line and states that he is returning phone call to PCP. Unable to find note, however, patient had labs on 4/13. Unable to find result note or letter. Will forward to PCP for further instruction on results.   Please return call to patient at [phone].   Talbot Grumbling, RN

## 2020-07-16 ENCOUNTER — Other Ambulatory Visit: Payer: Self-pay | Admitting: Family Medicine

## 2020-07-18 ENCOUNTER — Other Ambulatory Visit: Payer: Self-pay | Admitting: Family Medicine

## 2020-07-22 NOTE — Progress Notes (Addendum)
    SUBJECTIVE:   CHIEF COMPLAINT / HPI:   [name] is a 87 yo who presents for a follow up on his diabetes and elevated triglycerides.   Diabetes: On 4/13 his A1c was 11.3. He was not taking medication for his diabetes prior to that. We started him on Metformin '500mg'$  daily. He reports that he has been tolerating it well. He feels that the Metformin has surprisingly been helpful for his foot pain He states that it is hard to watch his diet since he eats what his step daughter prepares for him.   Foot pain: Reports shooting pain of L foot that started 2 days ago that comes and goes. Endorses pain at the arch of his foot. Denies any inciting injury. Has tried his Gabapentin for it but isn't sure if it has helped   Patient tachycardic to 112 bpm but on repeat was 90 bpm. He believes it was elevated due to him rushing to get to his appointment on time. Denies CP, palpitations, SOB.   Reports erectile dysfunction and requests a R Roman    OBJECTIVE:   BP 126/70   Pulse (!) 112   Wt 190 lb 3.2 oz (86.3 kg)   SpO2 95%   BMI 26.53 kg/m    General: alert, pleasant, NAD CV: RRR no murmurs Resp: CTAB normal WOB GI: soft, non distended, non tender MSK: normal tone and strength.  L foot Non erythematous or edematous mildly tender to palpation on plantar aspect.   ASSESSMENT/PLAN:   No problem-specific Assessment & Plan notes found for this encounter.   Diabetes Last A1c 11.3 1 month ago. Was started on Metformin '500mg'$  daily.  - Increase Metformin to '1000mg'$  daily  - return in 2 months to check A1c  - consider starting GLP-1 or SGLT-2 at next check up - f/u BMP  L foot pain Likely plantar fascitis or due to high arch. Also considered arthritis. Likely the former considering its recent onset. - conservative management with stretches - can use voltaren gel on area   Erectile Dysfunction Not taking any medications that would be contraindications  - Cialis '10mg'$  prn prior to  anticipated sexual activity    Shary Key, [place]

## 2020-07-23 ENCOUNTER — Ambulatory Visit (INDEPENDENT_AMBULATORY_CARE_PROVIDER_SITE_OTHER): Payer: PPO | Admitting: Family Medicine

## 2020-07-23 ENCOUNTER — Other Ambulatory Visit: Payer: Self-pay

## 2020-07-23 ENCOUNTER — Encounter: Payer: Self-pay | Admitting: Family Medicine

## 2020-07-23 VITALS — BP 126/70 | HR 112 | Wt 190.2 lb

## 2020-07-23 DIAGNOSIS — N183 Chronic kidney disease, stage 3 unspecified: Secondary | ICD-10-CM

## 2020-07-23 MED ORDER — TADALAFIL 20 MG PO TABS: 10.0000 mg | ORAL_TABLET | ORAL | 11 refills | Status: DC | PRN

## 2020-07-23 MED ORDER — METFORMIN HCL ER 500 MG PO TB24: 1000.0000 mg | ORAL_TABLET | Freq: Every day | ORAL | 3 refills | Status: DC

## 2020-07-23 NOTE — Patient Instructions (Signed)
It was great seeing you today!  Today we followed up on your diabetes and medication changes. I would like you to increase your Metformin to 1000 mg daily. We will also check your kidney function today since it was elevated last visit and you started a new medication.  For erectile dysfunction I am prescribing Cialis '10mg'$  prior to anticipated sexual activity. I am not sure if your insurance will cover this but I will submit the Rx for you in case it does.    For your foot pain, I recommend getting a tennis ball and rolling your foot over it for a few minutes daily. You can also use your voltaren gel for pain.   Please check-out at the front desk before leaving the clinic. I'd like to see you back in 2 months to check on your diabetes, but if you need to be seen earlier than that for any new issues we're happy to fit you in, just give [place] a call!   Feel free to call with any questions or concerns at any time, at [phone].   Take care,  Dr. Shary Key [facility]

## 2020-07-24 LAB — BASIC METABOLIC PANEL
BUN/Creatinine Ratio: 17 (ref 10–24)
BUN: 31 mg/dL — ABNORMAL HIGH (ref 8–27)
CO2: 15 mmol/L — ABNORMAL LOW (ref 20–29)
Calcium: 9.9 mg/dL (ref 8.6–10.2)
Chloride: 102 mmol/L (ref 96–106)
Creatinine, Ser: 1.82 mg/dL — ABNORMAL HIGH (ref 0.76–1.27)
Glucose: 188 mg/dL — ABNORMAL HIGH (ref 65–99)
Potassium: 4.8 mmol/L (ref 3.5–5.2)
Sodium: 140 mmol/L (ref 134–144)
eGFR: 36 mL/min/{1.73_m2} — ABNORMAL LOW (ref >59)

## 2020-07-27 ENCOUNTER — Telehealth: Payer: Self-pay

## 2020-07-27 NOTE — Telephone Encounter (Signed)
Patient calls nurse line regarding side effects from fenofibrate. Patient reports since taking medication he has been having pain in left foot.   Patient would like to discuss an alternative medication for management of cholesterol.   Please advise.   Talbot Grumbling, RN

## 2020-08-05 ENCOUNTER — Other Ambulatory Visit: Payer: Self-pay | Admitting: Family Medicine

## 2020-08-06 ENCOUNTER — Other Ambulatory Visit: Payer: Self-pay | Admitting: Family Medicine

## 2020-08-06 MED ORDER — EMPAGLIFLOZIN 10 MG PO TABS: 10.0000 mg | ORAL_TABLET | Freq: Every day | ORAL | 0 refills | Status: DC

## 2020-08-06 MED ORDER — ATORVASTATIN CALCIUM 40 MG PO TABS
40.0000 mg | ORAL_TABLET | Freq: Every day | ORAL | 11 refills | Status: DC
Start: 2020-08-06 — End: 2020-08-10

## 2020-08-06 NOTE — Progress Notes (Signed)
Called patient and advised him to stop taking his Metformin due to his kidney function and recommended starting Jardiance '10mg'$  daily. Also recommended he increase his Atorvastatin from '20mg'$  to '40mg'$  daily. He expressed understanding. He has a follow up appointment scheduled with me on 6/28

## 2020-08-10 MED ORDER — EMPAGLIFLOZIN 10 MG PO TABS: 10.0000 mg | ORAL_TABLET | Freq: Every day | ORAL | 0 refills | Status: DC

## 2020-08-10 MED ORDER — ATORVASTATIN CALCIUM 40 MG PO TABS: 40.0000 mg | ORAL_TABLET | Freq: Every day | ORAL | 11 refills | Status: DC

## 2020-08-10 NOTE — Addendum Note (Signed)
Addended by: Valerie Roys on: 08/10/2020 10:11 AM   Modules accepted: Orders

## 2020-08-11 MED ORDER — EMPAGLIFLOZIN 10 MG PO TABS: 10.0000 mg | ORAL_TABLET | Freq: Every day | ORAL | 0 refills | Status: DC

## 2020-08-11 NOTE — Addendum Note (Signed)
Addended by: Talbot Grumbling on: 08/11/2020 03:14 PM   Modules accepted: Orders

## 2020-08-15 ENCOUNTER — Other Ambulatory Visit: Payer: Self-pay | Admitting: Family Medicine

## 2020-08-15 DIAGNOSIS — J449 Chronic obstructive pulmonary disease, unspecified: Secondary | ICD-10-CM

## 2020-08-18 ENCOUNTER — Ambulatory Visit: Payer: PPO | Admitting: Podiatry

## 2020-08-28 ENCOUNTER — Ambulatory Visit: Payer: PPO | Admitting: Podiatry

## 2020-08-28 ENCOUNTER — Other Ambulatory Visit: Payer: Self-pay | Admitting: Family Medicine

## 2020-08-28 ENCOUNTER — Other Ambulatory Visit: Payer: Self-pay

## 2020-08-28 DIAGNOSIS — E1169 Type 2 diabetes mellitus with other specified complication: Secondary | ICD-10-CM | POA: Diagnosis not present

## 2020-08-28 DIAGNOSIS — E1142 Type 2 diabetes mellitus with diabetic polyneuropathy: Secondary | ICD-10-CM | POA: Diagnosis not present

## 2020-08-28 DIAGNOSIS — B351 Tinea unguium: Secondary | ICD-10-CM

## 2020-08-28 NOTE — Progress Notes (Signed)
  Subjective:  Patient ID: [name], adult    DOB: 23-Oct-1933,  MRN: [id number]  Chief Complaint  Patient presents with   Nail Problem    RFC Nail trim Bilateral nails L1-5 R 3-5   87 y.o. adult presents with the above complaint. History confirmed with patient.   Objective:  Physical Exam: warm, good capillary refill, nail exam onychomycosis of the toenails, no trophic changes or ulcerative lesions. DP pulses palpable, PT pulses palpable and protective sensation absent Left Foot: normal exam, no swelling, tenderness, instability; ligaments intact, full range of motion of all ankle/foot joints  Right Foot:  hammertoes noted . R 1st/2nd toe amputations noted. Well healed  No images are attached to the encounter.  Assessment:   1. Onychomycosis of multiple toenails with type 2 diabetes mellitus and peripheral neuropathy ([address])    Plan:  Patient was evaluated and treated and all questions answered.  Onychomycosis, Diabetes and DPN -Patient is diabetic with a qualifying condition for at risk foot care.  Procedure: Nail Debridement Type of Debridement: manual, sharp debridement. Instrumentation: Nail nipper, rotary burr. Number of Nails: 8   No follow-ups on file.

## 2020-09-01 ENCOUNTER — Ambulatory Visit (INDEPENDENT_AMBULATORY_CARE_PROVIDER_SITE_OTHER): Payer: PPO | Admitting: Family Medicine

## 2020-09-01 ENCOUNTER — Other Ambulatory Visit: Payer: Self-pay

## 2020-09-01 ENCOUNTER — Encounter: Payer: Self-pay | Admitting: Family Medicine

## 2020-09-01 VITALS — BP 120/70 | HR 87 | Ht 71.0 in | Wt 186.0 lb

## 2020-09-01 DIAGNOSIS — M170 Bilateral primary osteoarthritis of knee: Secondary | ICD-10-CM | POA: Diagnosis not present

## 2020-09-01 DIAGNOSIS — E1142 Type 2 diabetes mellitus with diabetic polyneuropathy: Secondary | ICD-10-CM

## 2020-09-01 LAB — POCT GLYCOSYLATED HEMOGLOBIN (HGB A1C): HbA1c, POC (controlled diabetic range): 8.5 % — AB (ref 0.0–7.0)

## 2020-09-01 MED ORDER — CYCLOSPORINE 0.05 % OP EMUL: 1.0000 [drp] | Freq: Two times a day (BID) | OPHTHALMIC | 3 refills | Status: DC

## 2020-09-01 MED ORDER — DICLOFENAC SODIUM 1 % TD GEL: TRANSDERMAL | 0 refills | Status: DC

## 2020-09-01 NOTE — Patient Instructions (Addendum)
It was great seeing you today!  Today we followed up on your diabetes after starting Jardiance. I am glad to see it has gone down to 8.5 from 11.3 in April. I have also refilled a few of your medications including Voltaren gel and your eye drops. If you need anything else please let me know.   I'd like to see you back in 6 months to follow up on your diabetes, and check your blood work, but if you need to be seen earlier than that for any new issues we're happy to fit you in, just give [place] a call!  Visit Reminders: - Stop by the pharmacy to pick up your prescriptions  - Continue to work on your healthy eating habits and incorporating exercise into your daily life.    If you haven't already, sign up for My Chart to have easy access to your labs results, and communication with your primary care physician.  Feel free to call with any questions or concerns at any time, at [phone].   Take care,  Dr. Shary Key [facility]

## 2020-09-01 NOTE — Progress Notes (Signed)
    SUBJECTIVE:   CHIEF COMPLAINT / HPI:   [name] is a 87 yo who presents to follow up on his diabetes. Last office visit on 5/19 his A1c was 11.3. His Metformin he was previously on was switched to Jardiance due to his kidney function. Today his A1c is down to 8.5. He denies symptoms of hypoglycemia. Also denies nausea, headache, chest pain, SOB.   He was started on Cialis last visit but he states it was too expensive. I offered to try a different option but he declines and states he has no current use for it. Denies any other questions or concerns today.    OBJECTIVE:   BP 120/70   Pulse 87   Ht '5\' 11"'$  (1.803 m)   Wt 84.4 kg   SpO2 95%   BMI 25.94 kg/m    General: alert, pleasant, NAD CV: RRR no murmurs Resp: CTAB normal WOB GI: soft, non distended Derm: warm, dry. Well perfused. No LE edema   ASSESSMENT/PLAN:   No problem-specific Assessment & Plan notes found for this encounter.  Type 2 diabetes A1c 8.5, down from 11.3 on 4/13. Was only recently started on medication for his diabetes this year. Denies symptoms of hypoglycemia - Continue Jardiance '10mg'$ . Consider increasing at next follow up depending on A1c - discussed healthy eating and physical activity   Hyperlipidemia  Last visit increased Atorvastatin from '20mg'$  to '40mg'$  - continue Atorvastatin '40mg'$    Refilled Voltaren gel and Restasis   Follow up in 3 months for diabetes check and blood work to check renal function and lipid panel     Shary Key, [place]

## 2020-09-02 LAB — MICROALBUMIN / CREATININE URINE RATIO
Creatinine, Urine: 44.8 mg/dL
Microalb/Creat Ratio: 36 mg/g creat — ABNORMAL HIGH (ref 0–29)
Microalbumin, Urine: 16.2 ug/mL

## 2020-09-07 ENCOUNTER — Other Ambulatory Visit: Payer: Self-pay | Admitting: Family Medicine

## 2020-10-06 ENCOUNTER — Other Ambulatory Visit: Payer: Self-pay | Admitting: Family Medicine

## 2020-10-16 ENCOUNTER — Other Ambulatory Visit: Payer: Self-pay | Admitting: Family Medicine

## 2020-10-30 ENCOUNTER — Other Ambulatory Visit: Payer: Self-pay | Admitting: Family Medicine

## 2020-10-31 ENCOUNTER — Other Ambulatory Visit: Payer: Self-pay | Admitting: Family Medicine

## 2020-11-02 ENCOUNTER — Other Ambulatory Visit: Payer: Self-pay | Admitting: Family Medicine

## 2020-11-02 ENCOUNTER — Telehealth: Payer: Self-pay

## 2020-11-02 MED ORDER — ATORVASTATIN CALCIUM 40 MG PO TABS: 40.0000 mg | ORAL_TABLET | Freq: Every day | ORAL | 3 refills | Status: DC

## 2020-11-02 NOTE — Telephone Encounter (Signed)
Patient calls nurse line stating he is completely out of Atorvastatin. Patient reports he was informed by his pharmacy this medication was denied by provider. Patient reports he was increased from '20mg'$  to '40mg'$ . Please advise.

## 2020-11-03 MED ORDER — ATORVASTATIN CALCIUM 40 MG PO TABS
40.0000 mg | ORAL_TABLET | Freq: Every day | ORAL | 3 refills | Status: DC
Start: 2020-11-03 — End: 2021-07-03

## 2020-11-03 NOTE — Telephone Encounter (Signed)
Patient is calling to check on the status of having his Atorvastatin refilled. I informed patient that we ask for up to 48 hours with medication refills. Patient was understanding but wants to let Dr. Arby Barrette know that he has been completely out for a few days now.

## 2020-11-03 NOTE — Telephone Encounter (Signed)
Medication was set to print. I have resent.

## 2020-11-03 NOTE — Addendum Note (Signed)
Addended by: Dorna Bloom on: 11/03/2020 09:52 AM   Modules accepted: Orders

## 2020-11-04 NOTE — Telephone Encounter (Signed)
Medication continues to revert to "print." I have called in the medication as written to [facility] on [address].   Talbot Grumbling, RN

## 2020-11-19 ENCOUNTER — Other Ambulatory Visit: Payer: Self-pay | Admitting: Podiatry

## 2020-12-01 ENCOUNTER — Ambulatory Visit: Payer: PPO | Admitting: Podiatry

## 2020-12-04 ENCOUNTER — Ambulatory Visit (INDEPENDENT_AMBULATORY_CARE_PROVIDER_SITE_OTHER): Payer: PPO | Admitting: Podiatry

## 2020-12-04 ENCOUNTER — Other Ambulatory Visit: Payer: Self-pay

## 2020-12-04 DIAGNOSIS — E1169 Type 2 diabetes mellitus with other specified complication: Secondary | ICD-10-CM | POA: Diagnosis not present

## 2020-12-04 DIAGNOSIS — B351 Tinea unguium: Secondary | ICD-10-CM

## 2020-12-04 DIAGNOSIS — E1142 Type 2 diabetes mellitus with diabetic polyneuropathy: Secondary | ICD-10-CM | POA: Diagnosis not present

## 2020-12-07 ENCOUNTER — Ambulatory Visit: Payer: PPO | Admitting: Family Medicine

## 2020-12-09 ENCOUNTER — Encounter (INDEPENDENT_AMBULATORY_CARE_PROVIDER_SITE_OTHER): Payer: PPO | Admitting: Family Medicine

## 2020-12-09 ENCOUNTER — Other Ambulatory Visit: Payer: Self-pay

## 2020-12-09 DIAGNOSIS — Z23 Encounter for immunization: Secondary | ICD-10-CM | POA: Diagnosis not present

## 2020-12-09 NOTE — Progress Notes (Signed)
Patient presents for flu and bilvalent covid booster.  Patient tolerated both administration without complication.  See admin for details.

## 2020-12-11 NOTE — Progress Notes (Signed)
  Subjective:  Patient ID: [name], adult    DOB: December 20, 1933,  MRN: [id number]  Chief Complaint  Patient presents with   Nail Problem    Nail trim 1-5 bilateral   87 y.o. adult presents with the above complaint. History confirmed with patient.  Denies issues today Objective:  Physical Exam: warm, good capillary refill, nail exam onychomycosis of the toenails, no trophic changes or ulcerative lesions. DP pulses palpable, PT pulses palpable and protective sensation absent Left Foot: normal exam, no swelling, tenderness, instability; ligaments intact, full range of motion of all ankle/foot joints  Right Foot:  hammertoes noted . R 1st/2nd toe amputations noted. Well healed  No images are attached to the encounter.  Assessment:   1. Onychomycosis of multiple toenails with type 2 diabetes mellitus and peripheral neuropathy ([place])     Plan:  Patient was evaluated and treated and all questions answered.  Onychomycosis, Diabetes and DPN -Patient is diabetic with a qualifying condition for at risk foot care.  Procedure: Nail Debridement Type of Debridement: manual, sharp debridement. Instrumentation: Nail nipper, rotary burr. Number of Nails: 8     No follow-ups on file.

## 2020-12-14 ENCOUNTER — Other Ambulatory Visit: Payer: Self-pay | Admitting: *Deleted

## 2020-12-14 DIAGNOSIS — J449 Chronic obstructive pulmonary disease, unspecified: Secondary | ICD-10-CM

## 2020-12-14 NOTE — Telephone Encounter (Signed)
Pt was told by Pharmacy that we declined his breo refill.  Do not see where we sent declined in chart.  Pt has 5 days left of Breo and has an appt early November with Dr. Arby Barrette ( was rescheduled from this week)  Is requesting to have at least one month sent in.  Christen Bame, CMA

## 2020-12-15 MED ORDER — FLUTICASONE FUROATE-VILANTEROL 200-25 MCG/INH IN AEPB: INHALATION_SPRAY | RESPIRATORY_TRACT | 3 refills | Status: DC

## 2020-12-16 NOTE — Telephone Encounter (Signed)
LMOVM informing patient. Christen Bame, CMA

## 2020-12-25 ENCOUNTER — Other Ambulatory Visit: Payer: Self-pay | Admitting: Family Medicine

## 2021-01-07 ENCOUNTER — Encounter: Payer: Self-pay | Admitting: Family Medicine

## 2021-01-07 ENCOUNTER — Other Ambulatory Visit: Payer: Self-pay

## 2021-01-07 ENCOUNTER — Other Ambulatory Visit: Payer: Self-pay | Admitting: Family Medicine

## 2021-01-07 ENCOUNTER — Ambulatory Visit (INDEPENDENT_AMBULATORY_CARE_PROVIDER_SITE_OTHER): Payer: PPO | Admitting: Family Medicine

## 2021-01-07 VITALS — BP 141/91 | HR 100 | Ht 71.0 in | Wt 184.6 lb

## 2021-01-07 DIAGNOSIS — N1832 Chronic kidney disease, stage 3b: Secondary | ICD-10-CM

## 2021-01-07 DIAGNOSIS — Z23 Encounter for immunization: Secondary | ICD-10-CM | POA: Diagnosis not present

## 2021-01-07 DIAGNOSIS — E1142 Type 2 diabetes mellitus with diabetic polyneuropathy: Secondary | ICD-10-CM

## 2021-01-07 DIAGNOSIS — M170 Bilateral primary osteoarthritis of knee: Secondary | ICD-10-CM | POA: Diagnosis not present

## 2021-01-07 DIAGNOSIS — J302 Other seasonal allergic rhinitis: Secondary | ICD-10-CM

## 2021-01-07 LAB — POCT GLYCOSYLATED HEMOGLOBIN (HGB A1C): HbA1c, POC (controlled diabetic range): 9.9 % — AB (ref 0.0–7.0)

## 2021-01-07 MED ORDER — EMPAGLIFLOZIN 25 MG PO TABS: 25.0000 mg | ORAL_TABLET | Freq: Every day | ORAL | 3 refills | Status: DC

## 2021-01-07 NOTE — Progress Notes (Signed)
1c 

## 2021-01-07 NOTE — Progress Notes (Addendum)
    SUBJECTIVE:   CHIEF COMPLAINT / HPI:   [name] is an 87 yo who presents for diabetes follow up. He states he has been doing ok though his knees have been hurting and he has been tired with less energy. He states he has not eating as much since wife passed and he lives with his daughter so has been eating more fast food. States he will be moving to live on his own soon because his daughter will be moving out of the sate.    OBJECTIVE:   BP (!) 141/91   Pulse 100   Ht 5\' 11"  (1.803 m)   Wt 184 lb 9.6 oz (83.7 kg)   SpO2 96%   BMI 25.75 kg/m    Physical exam General: well appearing, NAD Cardiovascular: RRR, no murmurs Lungs: CTAB. Normal WOB Abdomen: soft, non-distended, non-tender Skin: warm, dry. No edema  ASSESSMENT/PLAN:   No problem-specific Assessment & Plan notes found for this encounter.   DM2  A1c today 9.9, up from 8.5. Currently taking Jardiance 10mg . Was previously on Metformin but we discontinued that because it was causing worsening of his kidney function. - increase Jardiance to 25mg  daily. - f/u for repeat BMP in the upcoming week   CKD 3b Previous Cr 1.82 five months ago that has steadily been increasing. Discussed potential need for nephrology referral. Will recheck prior to referral.   Osteoarthritis of knees Patient with continued knee pain. Still able to ambulate on his own. Currently using Voltaren gel. Discussed trying Tylenol arthritis OTC which he was amenable to. Avoiding NSAIDs given his kidney disease. Will continue to offer and encourage PT  HLD Previous Trigs 663, VLDL 102, HDL 31. Currently on Atorvastatin 40mg . Will recheck and continue statin.     [place]

## 2021-01-07 NOTE — Patient Instructions (Addendum)
It was great seeing you today!  You came in for diabetes follow up. Your A1c increased to 9.9 so we are increasing your jardiance from 10mg  to 25mg  daily. Today we will check your kidney function and your lipid panel. I will call you with the results.   I recommed Tylenol Arthritis for your knee pain.   I'd like to see you back in 3 months for your next follow up, but if you need to be seen earlier than that for any new issues we're happy to fit you in, just give [place] a call!  Visit Reminders: - Stop by the pharmacy to pick up your prescriptions  - Continue to work on your healthy eating habits and incorporating exercise into your daily life.   Feel free to call with any questions or concerns at any time, at [phone].   Take care,  Dr. Shary Key [facility] Medicine Center

## 2021-01-08 DIAGNOSIS — E1142 Type 2 diabetes mellitus with diabetic polyneuropathy: Secondary | ICD-10-CM | POA: Diagnosis not present

## 2021-01-09 LAB — BASIC METABOLIC PANEL
BUN/Creatinine Ratio: 18 (ref 10–24)
BUN: 34 mg/dL — ABNORMAL HIGH (ref 8–27)
CO2: 17 mmol/L — ABNORMAL LOW (ref 20–29)
Calcium: 9.9 mg/dL (ref 8.6–10.2)
Chloride: 96 mmol/L (ref 96–106)
Creatinine, Ser: 1.84 mg/dL — ABNORMAL HIGH (ref 0.76–1.27)
Glucose: 235 mg/dL — ABNORMAL HIGH (ref 70–99)
Potassium: 4.3 mmol/L (ref 3.5–5.2)
Sodium: 134 mmol/L (ref 134–144)
eGFR: 35 mL/min/{1.73_m2} — ABNORMAL LOW (ref >59)

## 2021-01-09 LAB — LIPID PANEL
Chol/HDL Ratio: 3.7 ratio (ref 0.0–5.0)
Cholesterol, Total: 126 mg/dL (ref 100–199)
HDL: 34 mg/dL — ABNORMAL LOW (ref >39)
LDL Chol Calc (NIH): 59 mg/dL (ref 0–99)
Triglycerides: 202 mg/dL — ABNORMAL HIGH (ref 0–149)
VLDL Cholesterol Cal: 33 mg/dL (ref 5–40)

## 2021-01-12 ENCOUNTER — Other Ambulatory Visit: Payer: Self-pay | Admitting: Family Medicine

## 2021-01-12 DIAGNOSIS — N1832 Chronic kidney disease, stage 3b: Secondary | ICD-10-CM

## 2021-01-13 DIAGNOSIS — E1122 Type 2 diabetes mellitus with diabetic chronic kidney disease: Secondary | ICD-10-CM | POA: Diagnosis not present

## 2021-01-13 DIAGNOSIS — Z7984 Long term (current) use of oral hypoglycemic drugs: Secondary | ICD-10-CM | POA: Diagnosis not present

## 2021-01-13 DIAGNOSIS — Z89421 Acquired absence of other right toe(s): Secondary | ICD-10-CM | POA: Diagnosis not present

## 2021-01-13 DIAGNOSIS — N183 Chronic kidney disease, stage 3 unspecified: Secondary | ICD-10-CM | POA: Diagnosis not present

## 2021-01-13 DIAGNOSIS — Z89411 Acquired absence of right great toe: Secondary | ICD-10-CM | POA: Diagnosis not present

## 2021-01-19 ENCOUNTER — Other Ambulatory Visit: Payer: Self-pay | Admitting: Family Medicine

## 2021-01-19 DIAGNOSIS — J449 Chronic obstructive pulmonary disease, unspecified: Secondary | ICD-10-CM

## 2021-03-02 ENCOUNTER — Ambulatory Visit: Payer: PPO | Admitting: Podiatry

## 2021-03-05 ENCOUNTER — Ambulatory Visit: Payer: PPO | Admitting: Podiatry

## 2021-03-13 ENCOUNTER — Other Ambulatory Visit: Payer: Self-pay | Admitting: Family Medicine

## 2021-03-18 DIAGNOSIS — E119 Type 2 diabetes mellitus without complications: Secondary | ICD-10-CM | POA: Diagnosis not present

## 2021-03-18 DIAGNOSIS — H5213 Myopia, bilateral: Secondary | ICD-10-CM | POA: Diagnosis not present

## 2021-03-18 DIAGNOSIS — H1045 Other chronic allergic conjunctivitis: Secondary | ICD-10-CM | POA: Diagnosis not present

## 2021-03-18 DIAGNOSIS — H04123 Dry eye syndrome of bilateral lacrimal glands: Secondary | ICD-10-CM | POA: Diagnosis not present

## 2021-03-18 DIAGNOSIS — Z961 Presence of intraocular lens: Secondary | ICD-10-CM | POA: Diagnosis not present

## 2021-03-18 LAB — HM DIABETES EYE EXAM

## 2021-03-22 ENCOUNTER — Telehealth: Payer: Self-pay

## 2021-03-22 NOTE — Telephone Encounter (Signed)
Patient calls nurse line reporting he received generic Breo Ellipta instead of name brand. I called the pharmacy and as of January 1 patients insurance prefers generic. Pharmacy reports this was explained to patient when he picked up prescription on 1/9.  I called patient back to discuss. I assured him the generic is safe to to use in place of brand name. Patient advised to try the generic and to call back if he starts to have any adverse effects.   If patient can not tolerate the generic we can precede with a PA.

## 2021-03-23 ENCOUNTER — Other Ambulatory Visit: Payer: Self-pay | Admitting: Podiatry

## 2021-04-07 DIAGNOSIS — E1122 Type 2 diabetes mellitus with diabetic chronic kidney disease: Secondary | ICD-10-CM | POA: Diagnosis not present

## 2021-04-07 DIAGNOSIS — Z89421 Acquired absence of other right toe(s): Secondary | ICD-10-CM | POA: Diagnosis not present

## 2021-04-07 DIAGNOSIS — N183 Chronic kidney disease, stage 3 unspecified: Secondary | ICD-10-CM | POA: Diagnosis not present

## 2021-04-07 DIAGNOSIS — Z7984 Long term (current) use of oral hypoglycemic drugs: Secondary | ICD-10-CM | POA: Diagnosis not present

## 2021-04-07 DIAGNOSIS — Z89411 Acquired absence of right great toe: Secondary | ICD-10-CM | POA: Diagnosis not present

## 2021-04-09 ENCOUNTER — Ambulatory Visit (INDEPENDENT_AMBULATORY_CARE_PROVIDER_SITE_OTHER): Payer: PPO | Admitting: Family Medicine

## 2021-04-09 ENCOUNTER — Other Ambulatory Visit: Payer: Self-pay

## 2021-04-09 ENCOUNTER — Encounter: Payer: Self-pay | Admitting: Family Medicine

## 2021-04-09 VITALS — BP 143/81 | HR 95 | Ht 71.0 in | Wt 183.4 lb

## 2021-04-09 DIAGNOSIS — E1142 Type 2 diabetes mellitus with diabetic polyneuropathy: Secondary | ICD-10-CM | POA: Diagnosis not present

## 2021-04-09 DIAGNOSIS — J45909 Unspecified asthma, uncomplicated: Secondary | ICD-10-CM

## 2021-04-09 DIAGNOSIS — J449 Chronic obstructive pulmonary disease, unspecified: Secondary | ICD-10-CM

## 2021-04-09 LAB — POCT GLYCOSYLATED HEMOGLOBIN (HGB A1C): HbA1c, POC (controlled diabetic range): 8.6 % — AB (ref 0.0–7.0)

## 2021-04-09 MED ORDER — FLUTICASONE-SALMETEROL 250-50 MCG/ACT IN AEPB: 1.0000 | INHALATION_SPRAY | Freq: Two times a day (BID) | RESPIRATORY_TRACT | 0 refills | Status: DC

## 2021-04-09 NOTE — Patient Instructions (Signed)
It was great seeing you today!  We switched your inhaler to a comparable one (Advair) that your insurance should cover. It essentially has the same components. Let me know if there are any issues!   We checked your a1c which has come down to 8.6 from 9.9. Continue your current medication regimen and eating well   We are also checking your kidney function today.   We will see you in 3 months for your next diabetes follow up, but if you need to be seen earlier than that for any new issues we're happy to fit you in, just give [place] a call!  Visit Reminders: - Stop by the pharmacy to pick up your prescriptions  - Continue to work on your healthy eating habits and incorporating exercise into your daily life.    Feel free to call with any questions or concerns at any time, at [phone].   Take care,  Dr. Shary Key [facility]

## 2021-04-09 NOTE — Progress Notes (Signed)
° ° °  SUBJECTIVE:   CHIEF COMPLAINT / HPI:   [name] is an 88 yo who presents to discuss medication and follow up on diabetes. He reports that his insurance sent him a letter which he brought to the visit stating that they were no longer covering his Breo inhaler which he takes daily.   T2DM: A1c in November 9.9 and we increased Jardiance to 25mg  daily.  A1c today 8.6   OBJECTIVE:   BP (!) 143/81    Pulse 95    Ht 5\' 11"  (1.803 m)    Wt 183 lb 6.4 oz (83.2 kg)    SpO2 95%    BMI 25.58 kg/m    General: alert, pleasant, NAD CV: RRR no murmurs Resp: CTAB normal WOB GI: soft, non distended Derm: no visible rashes or lesions. No LE edema   ASSESSMENT/PLAN:   No problem-specific Assessment & Plan notes found for this encounter.   Asthma   COPD Patient has been stable on his Breo ellipta daily and states he never uses his rescue inhaler. Switched to Advair (fluticasone-salmeterol) 250-50.     T2DM A1c today 8.6 down from 9.9 in November when we increased Jardiance to 25mg  daily. Also currently taking Metformin 1000mg  daily. Will continue current medication regimen. He follows with podiatry and recommended having them do his diabetic foot exam. Will also check BMP for kidney function.   CKD3 Last Cr in November 1.84 which may be his new baseline. He declines wanting to follow with nephrology stating he is 51 and feels fine. Will check again today since there was not one from when he increased his Jardiance.   Will follow up in 3 months   [place]

## 2021-04-10 ENCOUNTER — Other Ambulatory Visit: Payer: Self-pay | Admitting: Family Medicine

## 2021-04-10 LAB — BASIC METABOLIC PANEL
BUN/Creatinine Ratio: 15 (ref 10–24)
BUN: 28 mg/dL — ABNORMAL HIGH (ref 8–27)
CO2: 24 mmol/L (ref 20–29)
Calcium: 10.1 mg/dL (ref 8.6–10.2)
Chloride: 102 mmol/L (ref 96–106)
Creatinine, Ser: 1.85 mg/dL — ABNORMAL HIGH (ref 0.76–1.27)
Glucose: 165 mg/dL — ABNORMAL HIGH (ref 70–99)
Potassium: 4.9 mmol/L (ref 3.5–5.2)
Sodium: 142 mmol/L (ref 134–144)
eGFR: 35 mL/min/{1.73_m2} — ABNORMAL LOW (ref >59)

## 2021-04-13 ENCOUNTER — Other Ambulatory Visit: Payer: Self-pay | Admitting: Family Medicine

## 2021-04-20 ENCOUNTER — Ambulatory Visit: Payer: PPO | Admitting: Podiatry

## 2021-04-20 ENCOUNTER — Other Ambulatory Visit: Payer: Self-pay

## 2021-04-20 ENCOUNTER — Encounter: Payer: Self-pay | Admitting: Podiatry

## 2021-04-20 DIAGNOSIS — B351 Tinea unguium: Secondary | ICD-10-CM | POA: Diagnosis not present

## 2021-04-20 DIAGNOSIS — E1169 Type 2 diabetes mellitus with other specified complication: Secondary | ICD-10-CM | POA: Diagnosis not present

## 2021-04-20 DIAGNOSIS — M792 Neuralgia and neuritis, unspecified: Secondary | ICD-10-CM

## 2021-04-20 DIAGNOSIS — E1142 Type 2 diabetes mellitus with diabetic polyneuropathy: Secondary | ICD-10-CM | POA: Diagnosis not present

## 2021-04-20 MED ORDER — GABAPENTIN 300 MG PO CAPS: 300.0000 mg | ORAL_CAPSULE | Freq: Four times a day (QID) | ORAL | 3 refills | Status: DC

## 2021-04-20 NOTE — Progress Notes (Signed)
°  Subjective:  Patient ID: [name], adult    DOB: 01/15/1934,  MRN: [id number]  Chief Complaint  Patient presents with   Nail Problem    Trim nails    88 y.o. adult presents with the above complaint. History confirmed with patient.  States the neuropathy is still very bothersome and he would like to discuss treatments for this. Objective:  Physical Exam: warm, good capillary refill, nail exam onychomycosis of the toenails, no trophic changes or ulcerative lesions. DP pulses palpable, PT pulses palpable and protective sensation absent Left Foot: normal exam, no swelling, tenderness, instability; ligaments intact, full range of motion of all ankle/foot joints  Right Foot:  hammertoes noted . R 1st/2nd toe amputations noted. Well healed  No images are attached to the encounter.  Assessment:   1. Onychomycosis of multiple toenails with type 2 diabetes mellitus and peripheral neuropathy ([name])   2. Neuralgia and neuritis     Plan:  Patient was evaluated and treated and all questions answered.  Onychomycosis, Diabetes and DPN -Patient is diabetic with a qualifying condition for at risk foot care.  Procedure: Nail Debridement Type of Debridement: manual, sharp debridement. Instrumentation: Nail nipper, rotary burr. Number of Nails: 10 Disposition: Patient tolerated well without iatrogenic injury.   Neuropathy -Discussed treatments - he is on Gabapentin 900 mg daily. We discussed he could increase this to 300 qAM and afternoon, and 600 qHS. Patient would like to proceed. Sent to pharmacy.   No follow-ups on file.

## 2021-05-08 ENCOUNTER — Other Ambulatory Visit: Payer: Self-pay | Admitting: Family Medicine

## 2021-05-08 DIAGNOSIS — M170 Bilateral primary osteoarthritis of knee: Secondary | ICD-10-CM

## 2021-05-15 ENCOUNTER — Other Ambulatory Visit: Payer: Self-pay | Admitting: Family Medicine

## 2021-05-25 ENCOUNTER — Ambulatory Visit (INDEPENDENT_AMBULATORY_CARE_PROVIDER_SITE_OTHER): Payer: PPO

## 2021-05-25 ENCOUNTER — Telehealth: Payer: Self-pay

## 2021-05-25 ENCOUNTER — Other Ambulatory Visit: Payer: Self-pay

## 2021-05-25 VITALS — BP 110/66 | HR 102 | Ht 71.5 in | Wt 180.0 lb

## 2021-05-25 DIAGNOSIS — Z0000 Encounter for general adult medical examination without abnormal findings: Secondary | ICD-10-CM | POA: Diagnosis not present

## 2021-05-25 NOTE — Telephone Encounter (Signed)
I did a AWV with patient this afternoon. He requested a refill on Spiriva and Wixela.  ? ?[name] [facility] [address] This is ready for patient and he is aware and fine with going to this location for prescription as he needs this today.  ? ?[facility] Fredricks[facility] [address] I called them to see if they could transfer. Pharmacy reports they will have to order prescription and will not be in until tomorrow.  ? ?Patient ok with picking up [facility]ela at one [facility] tonight and Spiriva at the other location when it comes in.  ?

## 2021-05-25 NOTE — Progress Notes (Signed)
? ?Subjective:  ? [name] is a 88 y.o. male who presents for Medicare Annual/Subsequent preventive examination. ? ?Review of Systems: Defer to PCP. ? ?Cardiac Risk Factors include: advanced age (>80men, >88 women);diabetes mellitus;male gender;dyslipidemia ? ?Objective:  ?  ?Vitals: BP 110/66   Pulse (!) 102   Ht 5' 11.5" (1.816 m)   Wt 180 lb (81.6 kg)   SpO2 95%   BMI 24.76 kg/m?   Body mass index is 24.76 kg/m?. ? ? ?  05/25/2021  ?  4:23 PM 09/01/2020  ?  2:45 PM 06/17/2020  ?  3:47 PM 07/09/2019  ? 11:28 AM 10/03/2018  ? 10:55 AM 04/27/2018  ?  2:08 PM 04/15/2018  ?  1:16 AM  ?Advanced Directives  ?Does Patient Have a Medical Advance Directive? Yes Yes No No Yes Yes Yes  ?Type of Paramedic of [place];Living will Living will;Healthcare Power of [place];Living will Living will Living will  ?Does patient want to make changes to medical advance directive?  No - Patient declined   No - Patient declined  No - Patient declined  ?Copy of [place] in Chart? Yes - validated most recent copy scanned in chart (See row information) Yes - validated most recent copy scanned in chart (See row information)   No - copy requested    ?Would patient like information on creating a medical advance directive?   No - Patient declined No - Patient declined     ? ?Tobacco ?Social History  ? ?Tobacco Use  ?Smoking Status Former  ? Packs/day: 0.50  ? Years: 60.00  ? Pack years: 30.00  ? Types: Cigars, Cigarettes  ? Start date: 03/08/1951  ? Quit date: 06/08/2011  ? Years since quitting: 10.0  ? Passive exposure: Past  ?Smokeless Tobacco Never  ?   ?Counseling given: No plans to restart. ? ?Clinical Intake: ? ?Pre-visit preparation completed: Yes ? ?Pain Score: 0-No pain ? ?Diabetic: Yes ? ?What is the last grade level you completed in school?: 10th grade ? ?Interpreter Needed?: No ? ?Past Medical History:  ?Diagnosis Date  ? ANEMIA, IRON DEF, NOS 01/15/2007   ? Qualifier: Diagnosis of  By: Jeannine Kitten MD, Rodman Key    ? Arthritis   ? Cataract extraction status of left eye 2016  ? Cataract extraction status of right eye 2016  ? CHOLECYSTECTOMY, LAPAROSCOPIC, HX OF 04/12/2007  ? Qualifier: Diagnosis of  By: Netty Starring  MD, Lucianne Muss    ? Critical lower limb ischemia ([place]) 10/02/2018  ? Diabetes mellitus without complication ([place])   ? Gout 2007  ? Hypertension   ? Toe ulcer ([place]) 09/20/2018  ? ?Past Surgical History:  ?Procedure Laterality Date  ? ABDOMINAL AORTOGRAM W/LOWER EXTREMITY N/A 10/03/2018  ? Procedure: ABDOMINAL AORTOGRAM W/LOWER EXTREMITY;  Surgeon: Marty Heck, MD;  Location: [facility];  Service: Cardiovascular;  Laterality: N/A;  Bilateral ?  ? CHOLECYSTECTOMY    ? ?Family History  ?Problem Relation Age of Onset  ? Asthma Mother   ? Arthritis Sister   ? Alcohol abuse Sister   ? Cirrhosis Sister   ? Asthma Sister   ? Heart disease Brother   ? Stroke Brother   ? Diabetes Brother   ? Cancer Brother   ?     colon  ? Stroke Brother   ? Heart disease Brother   ? Arthritis Daughter   ? Asthma Daughter   ? Diabetes Son   ? ?  Social History  ? ?Socioeconomic History  ? Marital status: Widowed  ?  Spouse name: Not on file  ? Number of children: Not on file  ? Years of education: 10  ? Highest education level: 10th grade  ?Occupational History  ? Occupation: RetiredAnimal nutritionist Work  ?Tobacco Use  ? Smoking status: Former  ?  Packs/day: 0.50  ?  Years: 60.00  ?  Pack years: 30.00  ?  Types: Cigars, Cigarettes  ?  Start date: 03/08/1951  ?  Quit date: 06/08/2011  ?  Years since quitting: 10.0  ?  Passive exposure: Past  ? Smokeless tobacco: Never  ?Vaping Use  ? Vaping Use: Never used  ?Substance and Sexual Activity  ? Alcohol use: No  ?  Comment: Hx of use  ? Drug use: No  ? Sexual activity: Not Currently  ?Other Topics Concern  ? Not on file  ?Social History Narrative  ? Patient lives with his daughter and her husband.  ? Patients wife died in 2018/06/06.  ? Health Care POA:   ?  Emergency Contact: DaughterElberta Fortis [phone]  ? End of Life Plan:   ?   ? Any pets: none  ? Diet: Patient has a varied diet and reports eating bacon and eggs every morning. Does not like most fruit. Likes flavored water, kool-aid.  ? No current exercise.    ? Seatbelts: Pt reports wearing seatbelt when in vehicle.  ? Hobbies: likes to watch [employer] and rest   ?   ?   ?   ?   ? ?Social Determinants of Health  ? ?Financial Resource Strain: Low Risk   ? Difficulty of Paying Living Expenses: Not very hard  ?Food Insecurity: Food Insecurity Present  ? Worried About Charity fundraiser in the Last Year: Sometimes true  ? Ran Out of Food in the Last Year: Sometimes true  ?Transportation Needs: No Transportation Needs  ? Lack of Transportation (Medical): No  ? Lack of Transportation (Non-Medical): No  ?Physical Activity: Inactive  ? Days of Exercise per Week: 0 days  ? Minutes of Exercise per Session: 0 min  ?Stress: No Stress Concern Present  ? Feeling of Stress : Not at all  ?Social Connections: Socially Isolated  ? Frequency of Communication with Friends and Family: More than three times a week  ? Frequency of Social Gatherings with Friends and Family: More than three times a week  ? Attends Religious Services: Never  ? Active Member of Clubs or Organizations: No  ? Attends Archivist Meetings: Never  ? Marital Status: Widowed  ? ?Outpatient Encounter Medications as of 05/25/2021  ?Medication Sig  ? albuterol (PROVENTIL) (2.5 MG/3ML) 0.083% nebulizer solution Take 3 mLs (2.5 mg total) by nebulization every 6 (six) hours as needed for wheezing or shortness of breath.  ? allopurinol (ZYLOPRIM) 300 MG tablet TAKE 1 TABLET BY MOUTH EVERY DAY  ? aspirin 81 MG EC tablet Take by mouth.  ? atorvastatin (LIPITOR) 40 MG tablet Take 1 tablet (40 mg total) by mouth daily.  ? cycloSPORINE (RESTASIS) 0.05 % ophthalmic emulsion INSTILL 1 DROP INTO BOTH EYES TWICE A DAY  ? diclofenac Sodium (VOLTAREN) 1 % GEL Apply 2 g  topically 4 (four) times daily.  ? diclofenac Sodium (VOLTAREN) 1 % GEL TAKE ON AFFECTED AREAS AS NEEDED FOR PAIN UP TO 4 TIMES A DAY  ? empagliflozin (JARDIANCE) 25 MG TABS tablet Take 1 tablet (25 mg total) by mouth daily.  ?  gabapentin (NEURONTIN) 300 MG capsule Take 1 capsule (300 mg total) by mouth 4 (four) times daily.  ? levocetirizine (XYZAL) 5 MG tablet TAKE 1 TABLET BY MOUTH TWICE A DAY  ? Multiple Vitamins-Minerals (MULTIVITAMIN WITH MINERALS) tablet Take 1 tablet by mouth daily.  ? SPIRIVA HANDIHALER 18 MCG inhalation capsule INHALE 1 CAPSULE VIA HANDIHALER ONCE DAILY AT THE SAME TIME EVERY DAY  ? triamcinolone cream (KENALOG) 0.1 % APPLY TO AFFECTED AREA TWICE A DAY  ? WIXELA INHUB 250-50 MCG/ACT AEPB INHALE 1 PUFF INTO THE LUNGS IN THE MORNING AND AT BEDTIME.  ? atorvastatin (LIPITOR) 20 MG tablet TAKE 1 TABLET BY MOUTH EVERY DAY (Patient not taking: Reported on 05/25/2021)  ? HYDROcodone-acetaminophen (NORCO/VICODIN) 5-325 MG tablet Take 1 tablet by mouth every 6 (six) hours as needed for moderate pain. (Patient not taking: Reported on 05/25/2021)  ? metFORMIN (GLUCOPHAGE-XR) 500 MG 24 hr tablet Take 2 tablets (1,000 mg total) by mouth daily with breakfast. (Patient not taking: Reported on 05/25/2021)  ? NONFORMULARY OR COMPOUNDED ITEM Apply 1-2 g topically 4 (four) times daily. (Patient not taking: Reported on 05/25/2021)  ? Sodium Hypochlorite (ANASEPT) 0.057 % LIQD Irrigate with as directed. (Patient not taking: Reported on 05/25/2021)  ? XARELTO 2.5 MG TABS tablet Take 2.5 mg by mouth 2 (two) times daily. (Patient not taking: Reported on 05/25/2021)  ? ?No facility-administered encounter medications on file as of 05/25/2021.  ? ?Activities of Daily Living ? ?  05/25/2021  ?  4:15 PM 05/25/2021  ?  4:14 PM  ?In your present state of health, do you have any difficulty performing the following activities:  ?Hearing?  1  ?Vision?  0  ?Difficulty concentrating or making decisions?  1  ?Walking or climbing  stairs?  1  ?Dressing or bathing?  0  ?Doing errands, shopping?  0  ?Preparing Food and eating ? N   ?Using the Toilet? N   ?In the past six months, have you accidently leaked urine? N   ?Do you have problems with loss

## 2021-06-04 ENCOUNTER — Other Ambulatory Visit: Payer: Self-pay | Admitting: Family Medicine

## 2021-06-08 NOTE — Patient Instructions (Signed)
Thank you for taking time to come for your Medicare Wellness Visit. I appreciate your ongoing commitment to your health goals. Please review the following plan we discussed and let me know if I can assist you in the future.  ?  ?These are the goals we discussed: ? ? Goals   ? ?  Eat more fruits and vegetables   ?  Exercise 1x per week (30 min per time)   ?  HEMOGLOBIN A1C < 7.0   ?  LDL CALC < 100   ? ?  ? ?We also discussed recommended health maintenance. Please call our office and schedule a visit. As discussed, you are due for: ?Health Maintenance  ?Topic Date Due  ? Zoster Vaccines- Shingrix (1 of 2) Never done  ? DEXA SCAN  Never done  ? TETANUS/TDAP  07/24/2018  ? COLONOSCOPY (Pts 45-31yrs Insurance coverage will need to be confirmed)  10/04/2018  ? FOOT EXAM  01/05/2019  ? URINE MICROALBUMIN  09/01/2021  ? INFLUENZA VACCINE  10/05/2021  ? HEMOGLOBIN A1C  10/07/2021  ? OPHTHALMOLOGY EXAM  03/18/2022  ? Pneumonia Vaccine 4+ Years old  Completed  ? COVID-19 Vaccine  Completed  ? HPV VACCINES  Aged Out  ? ?Diabetes apt due in May. ?Please call in April to schedule this apt. ?Discuss with PCP your options for colon cancer screening or if no longer recommended for you. ?Your shingles and tetanus vaccines can be given at your pharmacy.  ? ?Preventive Care 32 Years and Older, Male ?Preventive care refers to lifestyle choices and visits with your health care provider that can promote health and wellness. Preventive care visits are also called wellness exams. ?What can I expect for my preventive care visit? ?Counseling ?During your preventive care visit, your health care provider may ask about your: ?Medical history, including: ?Past medical problems. ?Family medical history. ?History of falls. ?Current health, including: ?Emotional well-being. ?Home life and relationship well-being. ?Sexual activity. ?Memory and ability to understand (cognition). ?Lifestyle, including: ?Alcohol, nicotine or tobacco, and drug  use. ?Access to firearms. ?Diet, exercise, and sleep habits. ?Work and work Statistician. ?Sunscreen use. ?Safety issues such as seatbelt and bike helmet use. ?Physical exam ?Your health care provider will check your: ?Height and weight. These may be used to calculate your BMI (body mass index). BMI is a measurement that tells if you are at a healthy weight. ?Waist circumference. This measures the distance around your waistline. This measurement also tells if you are at a healthy weight and may help predict your risk of certain diseases, such as type 2 diabetes and high blood pressure. ?Heart rate and blood pressure. ?Body temperature. ?Skin for abnormal spots. ?What immunizations do I need? ?Vaccines are usually given at various ages, according to a schedule. Your health care provider will recommend vaccines for you based on your age, medical history, and lifestyle or other factors, such as travel or where you work. ?What tests do I need? ?Screening ?Your health care provider may recommend screening tests for certain conditions. This may include: ?Lipid and cholesterol levels. ?Diabetes screening. This is done by checking your blood sugar (glucose) after you have not eaten for a while (fasting). ?Hepatitis C test. ?Hepatitis B test. ?HIV (human immunodeficiency virus) test. ?STI (sexually transmitted infection) testing, if you are at risk. ?Lung cancer screening. ?Colorectal cancer screening. ?Prostate cancer screening. ?Abdominal aortic aneurysm (AAA) screening. You may need this if you are a current or former smoker. ?Talk with your health care provider about  your test results, treatment options, and if necessary, the need for more tests. ?Follow these instructions at home: ?Eating and drinking ? ?Eat a diet that includes fresh fruits and vegetables, whole grains, lean protein, and low-fat dairy products. Limit your intake of foods with high amounts of sugar, saturated fats, and salt. ?Take vitamin and mineral  supplements as recommended by your health care provider. ?Do not drink alcohol if your health care provider tells you not to drink. ?If you drink alcohol: ?Limit how much you have to 0-2 drinks a day. ?Know how much alcohol is in your drink. In the [place] one drink equals one 12 oz bottle of beer (355 mL), one 5 oz glass of wine (148 mL), or one 1? oz glass of hard liquor (44 mL). ?Lifestyle ?Brush your teeth every morning and night with fluoride toothpaste. Floss one time each day. ?Exercise for at least 30 minutes 5 or more days each week. ?Do not use any products that contain nicotine or tobacco. These products include cigarettes, chewing tobacco, and vaping devices, such as e-cigarettes. If you need help quitting, ask your health care provider. ?Do not use drugs. ?If you are sexually active, practice safe sex. Use a condom or other form of protection to prevent STIs. ?Take aspirin only as told by your health care provider. Make sure that you understand how much to take and what form to take. Work with your health care provider to find out whether it is safe and beneficial for you to take aspirin daily. ?Ask your health care provider if you need to take a cholesterol-lowering medicine (statin). ?Find healthy ways to manage stress, such as: ?Meditation, yoga, or listening to music. ?Journaling. ?Talking to a trusted person. ?Spending time with friends and family. ?Safety ?Always wear your seat belt while driving or riding in a vehicle. ?Do not drive: ?If you have been drinking alcohol. Do not ride with someone who has been drinking. ?When you are tired or distracted. ?While texting. ?If you have been using any mind-altering substances or drugs. ?Wear a helmet and other protective equipment during sports activities. ?If you have firearms in your house, make sure you follow all gun safety procedures. ?Minimize exposure to UV radiation to reduce your risk of skin cancer. ?What's next? ?Visit your health care provider  once a year for an annual wellness visit. ?Ask your health care provider how often you should have your eyes and teeth checked. ?Stay up to date on all vaccines. ?This information is not intended to replace advice given to you by your health care provider. Make sure you discuss any questions you have with your health care provider. ?Document Revised: 08/19/2020 Document Reviewed: 08/19/2020 ?Elsevier Patient Education ? [place]. ? ?Fall Prevention in the Home, Adult ?Falls can cause injuries and can happen to people of all ages. There are many things you can do to make your home safe and to help prevent falls. Ask for help when making these changes. ?What actions can I take to prevent falls? ?General Instructions ?Use good lighting in all rooms. Replace any light bulbs that burn out. ?Turn on the lights in dark areas. Use night-lights. ?Keep items that you use often in easy-to-reach places. Lower the shelves around your home if needed. ?Set up your furniture so you have a clear path. Avoid moving your furniture around. ?Do not have throw rugs or other things on the floor that can make you trip. ?Avoid walking on wet floors. ?If any of your  floors are uneven, fix them. ?Add color or contrast paint or tape to clearly mark and help you see: ?Grab bars or handrails. ?First and last steps of staircases. ?Where the edge of each step is. ?If you use a stepladder: ?Make sure that it is fully opened. Do not climb a closed stepladder. ?Make sure the sides of the stepladder are locked in place. ?Ask someone to hold the stepladder while you use it. ?Know where your pets are when moving through your home. ?What can I do in the bathroom? ?  ?Keep the floor dry. Clean up any water on the floor right away. ?Remove soap buildup in the tub or shower. ?Use nonskid mats or decals on the floor of the tub or shower. ?Attach bath mats securely with double-sided, nonslip rug tape. ?If you need to sit down in the shower, use a  plastic, nonslip stool. ?Install grab bars by the toilet and in the tub and shower. Do not use towel bars as grab bars. ?What can I do in the bedroom? ?Make sure that you have a light by your bed that is easy to re

## 2021-06-08 NOTE — Progress Notes (Signed)
I have reviewed this visit and agree with the documentation.   

## 2021-07-01 ENCOUNTER — Emergency Department (HOSPITAL_COMMUNITY): Payer: PPO

## 2021-07-01 ENCOUNTER — Inpatient Hospital Stay (HOSPITAL_COMMUNITY)
Admission: EM | Admit: 2021-07-01 | Discharge: 2021-07-03 | DRG: 871 | Disposition: A | Discharge status: Home / Self Care | Payer: PPO | Attending: Family Medicine | Admitting: Family Medicine

## 2021-07-01 ENCOUNTER — Encounter (HOSPITAL_COMMUNITY): Payer: Self-pay

## 2021-07-01 ENCOUNTER — Other Ambulatory Visit: Payer: Self-pay

## 2021-07-01 DIAGNOSIS — I213 ST elevation (STEMI) myocardial infarction of unspecified site: Secondary | ICD-10-CM | POA: Diagnosis not present

## 2021-07-01 DIAGNOSIS — J189 Pneumonia, unspecified organism: Secondary | ICD-10-CM | POA: Diagnosis not present

## 2021-07-01 DIAGNOSIS — Z8249 Family history of ischemic heart disease and other diseases of the circulatory system: Secondary | ICD-10-CM

## 2021-07-01 DIAGNOSIS — J440 Chronic obstructive pulmonary disease with acute lower respiratory infection: Secondary | ICD-10-CM | POA: Diagnosis not present

## 2021-07-01 DIAGNOSIS — Z825 Family history of asthma and other chronic lower respiratory diseases: Secondary | ICD-10-CM

## 2021-07-01 DIAGNOSIS — R5383 Other fatigue: Secondary | ICD-10-CM | POA: Diagnosis not present

## 2021-07-01 DIAGNOSIS — R262 Difficulty in walking, not elsewhere classified: Secondary | ICD-10-CM | POA: Diagnosis not present

## 2021-07-01 DIAGNOSIS — J9621 Acute and chronic respiratory failure with hypoxia: Secondary | ICD-10-CM | POA: Diagnosis not present

## 2021-07-01 DIAGNOSIS — A419 Sepsis, unspecified organism: Principal | ICD-10-CM | POA: Diagnosis present

## 2021-07-01 DIAGNOSIS — J45901 Unspecified asthma with (acute) exacerbation: Secondary | ICD-10-CM | POA: Diagnosis not present

## 2021-07-01 DIAGNOSIS — Z833 Family history of diabetes mellitus: Secondary | ICD-10-CM | POA: Diagnosis not present

## 2021-07-01 DIAGNOSIS — Z20822 Contact with and (suspected) exposure to covid-19: Secondary | ICD-10-CM | POA: Diagnosis not present

## 2021-07-01 DIAGNOSIS — R609 Edema, unspecified: Secondary | ICD-10-CM | POA: Diagnosis not present

## 2021-07-01 DIAGNOSIS — E1151 Type 2 diabetes mellitus with diabetic peripheral angiopathy without gangrene: Secondary | ICD-10-CM | POA: Diagnosis not present

## 2021-07-01 DIAGNOSIS — N1832 Chronic kidney disease, stage 3b: Secondary | ICD-10-CM | POA: Diagnosis not present

## 2021-07-01 DIAGNOSIS — Z89411 Acquired absence of right great toe: Secondary | ICD-10-CM

## 2021-07-01 DIAGNOSIS — T380X5A Adverse effect of glucocorticoids and synthetic analogues, initial encounter: Secondary | ICD-10-CM | POA: Diagnosis present

## 2021-07-01 DIAGNOSIS — Z66 Do not resuscitate: Secondary | ICD-10-CM | POA: Diagnosis not present

## 2021-07-01 DIAGNOSIS — Z79899 Other long term (current) drug therapy: Secondary | ICD-10-CM

## 2021-07-01 DIAGNOSIS — H9190 Unspecified hearing loss, unspecified ear: Secondary | ICD-10-CM | POA: Diagnosis not present

## 2021-07-01 DIAGNOSIS — R739 Hyperglycemia, unspecified: Secondary | ICD-10-CM | POA: Diagnosis not present

## 2021-07-01 DIAGNOSIS — D631 Anemia in chronic kidney disease: Secondary | ICD-10-CM | POA: Diagnosis not present

## 2021-07-01 DIAGNOSIS — J441 Chronic obstructive pulmonary disease with (acute) exacerbation: Secondary | ICD-10-CM | POA: Diagnosis not present

## 2021-07-01 DIAGNOSIS — I129 Hypertensive chronic kidney disease with stage 1 through stage 4 chronic kidney disease, or unspecified chronic kidney disease: Secondary | ICD-10-CM | POA: Diagnosis not present

## 2021-07-01 DIAGNOSIS — Z87891 Personal history of nicotine dependence: Secondary | ICD-10-CM | POA: Diagnosis not present

## 2021-07-01 DIAGNOSIS — J9601 Acute respiratory failure with hypoxia: Secondary | ICD-10-CM | POA: Diagnosis not present

## 2021-07-01 DIAGNOSIS — E1165 Type 2 diabetes mellitus with hyperglycemia: Secondary | ICD-10-CM | POA: Diagnosis present

## 2021-07-01 DIAGNOSIS — N183 Chronic kidney disease, stage 3 unspecified: Secondary | ICD-10-CM | POA: Diagnosis present

## 2021-07-01 DIAGNOSIS — E1142 Type 2 diabetes mellitus with diabetic polyneuropathy: Secondary | ICD-10-CM | POA: Diagnosis present

## 2021-07-01 DIAGNOSIS — I739 Peripheral vascular disease, unspecified: Secondary | ICD-10-CM | POA: Diagnosis present

## 2021-07-01 DIAGNOSIS — R531 Weakness: Secondary | ICD-10-CM | POA: Diagnosis not present

## 2021-07-01 DIAGNOSIS — J45909 Unspecified asthma, uncomplicated: Secondary | ICD-10-CM | POA: Diagnosis present

## 2021-07-01 DIAGNOSIS — Z7982 Long term (current) use of aspirin: Secondary | ICD-10-CM

## 2021-07-01 DIAGNOSIS — R000 Tachycardia, unspecified: Secondary | ICD-10-CM | POA: Diagnosis not present

## 2021-07-01 DIAGNOSIS — R0902 Hypoxemia: Secondary | ICD-10-CM | POA: Diagnosis not present

## 2021-07-01 DIAGNOSIS — E1122 Type 2 diabetes mellitus with diabetic chronic kidney disease: Secondary | ICD-10-CM | POA: Diagnosis present

## 2021-07-01 DIAGNOSIS — Z7984 Long term (current) use of oral hypoglycemic drugs: Secondary | ICD-10-CM

## 2021-07-01 DIAGNOSIS — R0602 Shortness of breath: Secondary | ICD-10-CM | POA: Diagnosis not present

## 2021-07-01 DIAGNOSIS — I10 Essential (primary) hypertension: Secondary | ICD-10-CM | POA: Diagnosis present

## 2021-07-01 LAB — URINALYSIS, ROUTINE W REFLEX MICROSCOPIC
Bilirubin Urine: NEGATIVE
Glucose, UA: >=500 mg/dL — AB
Ketones, ur: NEGATIVE mg/dL
Leukocytes,Ua: NEGATIVE
Nitrite: NEGATIVE
Protein, ur: 30 mg/dL — AB
Specific Gravity, Urine: 1.016 (ref 1.005–1.030)
pH: 5 (ref 5.0–8.0)

## 2021-07-01 LAB — BASIC METABOLIC PANEL
Anion gap: 12 (ref 5–15)
BUN: 51 mg/dL — ABNORMAL HIGH (ref 8–23)
CO2: 21 mmol/L — ABNORMAL LOW (ref 22–32)
Calcium: 9.2 mg/dL (ref 8.9–10.3)
Chloride: 103 mmol/L (ref 98–111)
Creatinine, Ser: 2.34 mg/dL — ABNORMAL HIGH (ref 0.61–1.24)
GFR, Estimated: 27 mL/min — ABNORMAL LOW (ref >60)
Glucose, Bld: 210 mg/dL — ABNORMAL HIGH (ref 70–99)
Potassium: 3.6 mmol/L (ref 3.5–5.1)
Sodium: 136 mmol/L (ref 135–145)

## 2021-07-01 LAB — CBC WITH DIFFERENTIAL/PLATELET
Abs Immature Granulocytes: 0.08 10*3/uL — ABNORMAL HIGH (ref 0.00–0.07)
Basophils Absolute: 0 10*3/uL (ref 0.0–0.1)
Basophils Relative: 0 %
Eosinophils Absolute: 0.2 10*3/uL (ref 0.0–0.5)
Eosinophils Relative: 1 %
HCT: 40.1 % (ref 39.0–52.0)
Hemoglobin: 13.8 g/dL (ref 13.0–17.0)
Immature Granulocytes: 1 %
Lymphocytes Relative: 11 %
Lymphs Abs: 1.5 10*3/uL (ref 0.7–4.0)
MCH: 25.6 pg — ABNORMAL LOW (ref 26.0–34.0)
MCHC: 34.4 g/dL (ref 30.0–36.0)
MCV: 74.3 fL — ABNORMAL LOW (ref 80.0–100.0)
Monocytes Absolute: 1.1 10*3/uL — ABNORMAL HIGH (ref 0.1–1.0)
Monocytes Relative: 8 %
Neutro Abs: 11.5 10*3/uL — ABNORMAL HIGH (ref 1.7–7.7)
Neutrophils Relative %: 79 %
Platelets: 239 10*3/uL (ref 150–400)
RBC: 5.4 MIL/uL (ref 4.22–5.81)
RDW: 15.8 % — ABNORMAL HIGH (ref 11.5–15.5)
WBC: 14.4 10*3/uL — ABNORMAL HIGH (ref 4.0–10.5)
nRBC: 0 % (ref 0.0–0.2)

## 2021-07-01 LAB — BLOOD GAS, VENOUS
Acid-base deficit: 7.9 mmol/L — ABNORMAL HIGH (ref 0.0–2.0)
Bicarbonate: 16.7 mmol/L — ABNORMAL LOW (ref 20.0–28.0)
Drawn by: 6415
O2 Saturation: 91.1 %
Patient temperature: 37
pCO2, Ven: 31 mmHg — ABNORMAL LOW (ref 44–60)
pH, Ven: 7.34 (ref 7.25–7.43)
pO2, Ven: 58 mmHg — ABNORMAL HIGH (ref 32–45)

## 2021-07-01 LAB — HEPATIC FUNCTION PANEL
ALT: 37 U/L (ref 0–44)
AST: 37 U/L (ref 15–41)
Albumin: 2.6 g/dL — ABNORMAL LOW (ref 3.5–5.0)
Alkaline Phosphatase: 71 U/L (ref 38–126)
Bilirubin, Direct: 0.5 mg/dL — ABNORMAL HIGH (ref 0.0–0.2)
Indirect Bilirubin: 0.4 mg/dL (ref 0.3–0.9)
Total Bilirubin: 0.9 mg/dL (ref 0.3–1.2)
Total Protein: 6.6 g/dL (ref 6.5–8.1)

## 2021-07-01 LAB — I-STAT VENOUS BLOOD GAS, ED
Acid-base deficit: 3 mmol/L — ABNORMAL HIGH (ref 0.0–2.0)
Bicarbonate: 21.9 mmol/L (ref 20.0–28.0)
Calcium, Ion: 1.15 mmol/L (ref 1.15–1.40)
HCT: 42 % (ref 39.0–52.0)
Hemoglobin: 14.3 g/dL (ref 13.0–17.0)
O2 Saturation: 41 %
Potassium: 3.5 mmol/L (ref 3.5–5.1)
Sodium: 136 mmol/L (ref 135–145)
TCO2: 23 mmol/L (ref 22–32)
pCO2, Ven: 37.8 mmHg — ABNORMAL LOW (ref 44–60)
pH, Ven: 7.372 (ref 7.25–7.43)
pO2, Ven: 24 mmHg — CL (ref 32–45)

## 2021-07-01 LAB — RESP PANEL BY RT-PCR (RSV, FLU A&B, COVID)  RVPGX2
Influenza A by PCR: NEGATIVE
Influenza B by PCR: NEGATIVE
Resp Syncytial Virus by PCR: NEGATIVE
SARS Coronavirus 2 by RT PCR: NEGATIVE

## 2021-07-01 LAB — BRAIN NATRIURETIC PEPTIDE: B Natriuretic Peptide: 83 pg/mL (ref 0.0–100.0)

## 2021-07-01 LAB — GLUCOSE, CAPILLARY: Glucose-Capillary: 245 mg/dL — ABNORMAL HIGH (ref 70–99)

## 2021-07-01 LAB — D-DIMER, QUANTITATIVE: D-Dimer, Quant: 2.53 ug/mL-FEU — ABNORMAL HIGH (ref 0.00–0.50)

## 2021-07-01 LAB — LACTIC ACID, PLASMA
Lactic Acid, Venous: 1.3 mmol/L (ref 0.5–1.9)
Lactic Acid, Venous: 1.5 mmol/L (ref 0.5–1.9)

## 2021-07-01 LAB — HEMOGLOBIN A1C
Hgb A1c MFr Bld: 7.7 % — ABNORMAL HIGH (ref 4.8–5.6)
Mean Plasma Glucose: 174.29 mg/dL

## 2021-07-01 LAB — TROPONIN I (HIGH SENSITIVITY): Troponin I (High Sensitivity): 33 ng/L — ABNORMAL HIGH (ref <18)

## 2021-07-01 MED ORDER — LACTATED RINGERS IV SOLN: INTRAVENOUS | Status: DC

## 2021-07-01 MED ORDER — ONDANSETRON HCL 4 MG/2ML IJ SOLN: 4.0000 mg | Freq: Four times a day (QID) | INTRAMUSCULAR | Status: DC | PRN

## 2021-07-01 MED ORDER — ACETAMINOPHEN 325 MG PO TABS
650.0000 mg | ORAL_TABLET | Freq: Once | ORAL | Status: AC
  Administered 2021-07-01: 650 mg via ORAL
  Filled 2021-07-01: qty 2

## 2021-07-01 MED ORDER — POLYETHYLENE GLYCOL 3350 17 G PO PACK: 17.0000 g | PACK | Freq: Every day | ORAL | Status: DC | PRN

## 2021-07-01 MED ORDER — IPRATROPIUM-ALBUTEROL 0.5-2.5 (3) MG/3ML IN SOLN
3.0000 mL | Freq: Once | RESPIRATORY_TRACT | Status: AC
  Administered 2021-07-01: 3 mL via RESPIRATORY_TRACT
  Filled 2021-07-01: qty 3

## 2021-07-01 MED ORDER — SODIUM CHLORIDE 0.9 % IV SOLN
500.0000 mg | Freq: Once | INTRAVENOUS | Status: AC
  Administered 2021-07-01: 500 mg via INTRAVENOUS
  Filled 2021-07-01: qty 5

## 2021-07-01 MED ORDER — ALBUTEROL SULFATE (2.5 MG/3ML) 0.083% IN NEBU: 2.5000 mg | INHALATION_SOLUTION | RESPIRATORY_TRACT | Status: DC | PRN

## 2021-07-01 MED ORDER — SODIUM CHLORIDE 0.9 % IV BOLUS: 500.0000 mL | Freq: Once | INTRAVENOUS | Status: DC

## 2021-07-01 MED ORDER — SODIUM CHLORIDE 0.9 % IV BOLUS
1000.0000 mL | Freq: Once | INTRAVENOUS | Status: AC
Start: 2021-07-01 — End: 2021-07-01
  Administered 2021-07-01: 1000 mL via INTRAVENOUS

## 2021-07-01 MED ORDER — METHYLPREDNISOLONE SODIUM SUCC 40 MG IJ SOLR: 40.0000 mg | Freq: Every day | INTRAMUSCULAR | Status: DC

## 2021-07-01 MED ORDER — SODIUM CHLORIDE 0.9 % IV SOLN
2.0000 g | Freq: Once | INTRAVENOUS | Status: AC
  Administered 2021-07-01: 2 g via INTRAVENOUS
  Filled 2021-07-01: qty 20

## 2021-07-01 MED ORDER — POTASSIUM CHLORIDE CRYS ER 20 MEQ PO TBCR
20.0000 meq | EXTENDED_RELEASE_TABLET | Freq: Once | ORAL | Status: AC
Start: 2021-07-01 — End: 2021-07-01
  Administered 2021-07-01: 20 meq via ORAL
  Filled 2021-07-01: qty 1

## 2021-07-01 MED ORDER — DOCUSATE SODIUM 100 MG PO CAPS: 100.0000 mg | ORAL_CAPSULE | Freq: Two times a day (BID) | ORAL | Status: DC | PRN

## 2021-07-01 MED ORDER — SODIUM CHLORIDE 0.9 % IV SOLN
500.0000 mg | INTRAVENOUS | Status: DC
Start: 2021-07-02 — End: 2021-07-02
  Filled 2021-07-01: qty 5

## 2021-07-01 MED ORDER — METHYLPREDNISOLONE SODIUM SUCC 125 MG IJ SOLR
125.0000 mg | Freq: Once | INTRAMUSCULAR | Status: AC
  Administered 2021-07-01: 125 mg via INTRAVENOUS
  Filled 2021-07-01: qty 2

## 2021-07-01 MED ORDER — SODIUM CHLORIDE 0.9 % IV SOLN: 2.0000 g | INTRAVENOUS | Status: DC

## 2021-07-01 MED ORDER — HEPARIN SODIUM (PORCINE) 5000 UNIT/ML IJ SOLN
5000.0000 [IU] | Freq: Three times a day (TID) | INTRAMUSCULAR | Status: DC
  Administered 2021-07-01 – 2021-07-03 (×6): 5000 [IU] via SUBCUTANEOUS
  Filled 2021-07-01 (×6): qty 1

## 2021-07-01 MED ORDER — INSULIN ASPART 100 UNIT/ML IJ SOLN
0.0000 [IU] | INTRAMUSCULAR | Status: DC
  Administered 2021-07-01 – 2021-07-02 (×2): 3 [IU] via SUBCUTANEOUS
  Administered 2021-07-02: 5 [IU] via SUBCUTANEOUS
  Administered 2021-07-02: 3 [IU] via SUBCUTANEOUS

## 2021-07-01 NOTE — ED Provider Notes (Signed)
?[facility] ?Provider Note ? ? ?CSN: [id number] ?Arrival date & time: 07/01/21  1400 ? ?  ? ?History ? ?Chief Complaint  ?Patient presents with  ? Shortness of Breath  ? Weakness  ? ? ?[name] is a 88 y.o. adult. With past medical history of HTN, DM, COPD, CKD, who presents to the emergency department with shortness of breath and weakness.  ? ?States that he has had fatigue and weakness since Monday.  He states that he cut the grass for the first time in years on Friday and wonders if this triggered something.  He states Monday he began having fatigue and weakness as well as tactile fever.  He states that it has been constant since Monday.  Additionally he began having diarrhea yesterday.  He states that he has had decreased p.o. intake over the past week.  He states that he feels like he is at his baseline shortness of breath for his COPD.  States that he does not wear oxygen at home.  Has been using his albuterol inhaler as prescribed.  He does endorse cough with mild sputum production.  He denies any chest pain, palpitations, lower extremity swelling, nausea or vomiting. ? ? ?Shortness of Breath ?Associated symptoms: cough, fever and wheezing   ?Associated symptoms: no chest pain and no vomiting   ?Weakness ?Associated symptoms: cough, diarrhea, fever and shortness of breath   ?Associated symptoms: no chest pain, no dysuria, no nausea and no vomiting   ? ?  ? ?Home Medications ?Prior to Admission medications   ?Medication Sig Start Date End Date Taking? Authorizing Provider  ?albuterol (PROVENTIL) (2.5 MG/3ML) 0.083% nebulizer solution Take 3 mLs (2.5 mg total) by nebulization every 6 (six) hours as needed for wheezing or shortness of breath. 01/22/20   Shary Key, DO  ?allopurinol (ZYLOPRIM) 300 MG tablet TAKE 1 TABLET BY MOUTH EVERY DAY 03/18/21   Shary Key, DO  ?aspirin 81 MG EC tablet Take by mouth.    [provider]  ?atorvastatin  (LIPITOR) 20 MG tablet TAKE 1 TABLET BY MOUTH EVERY DAY ?Patient not taking: Reported on 05/25/2021 01/12/21   Shary Key, DO  ?atorvastatin (LIPITOR) 40 MG tablet Take 1 tablet (40 mg total) by mouth daily. 11/03/20 11/03/21  Shary Key, DO  ?cycloSPORINE (RESTASIS) 0.05 % ophthalmic emulsion INSTILL 1 DROP INTO BOTH EYES TWICE A DAY 04/12/21   Shary Key, DO  ?diclofenac Sodium (VOLTAREN) 1 % GEL Apply 2 g topically 4 (four) times daily. 06/17/20   Shary Key, DO  ?diclofenac Sodium (VOLTAREN) 1 % GEL TAKE ON AFFECTED AREAS AS NEEDED FOR PAIN UP TO 4 TIMES A DAY 05/10/21   Paige, Weldon Picking, DO  ?empagliflozin (JARDIANCE) 25 MG TABS tablet Take 1 tablet (25 mg total) by mouth daily. 01/07/21   Shary Key, DO  ?gabapentin (NEURONTIN) 300 MG capsule Take 1 capsule (300 mg total) by mouth 4 (four) times daily. 04/20/21   Evelina Bucy, DPM  ?HYDROcodone-acetaminophen (NORCO/VICODIN) 5-325 MG tablet Take 1 tablet by mouth every 6 (six) hours as needed for moderate pain. ?Patient not taking: Reported on 05/25/2021 11/08/18   Guadalupe Dawn, MD  ?levocetirizine (XYZAL) 5 MG tablet TAKE 1 TABLET BY MOUTH TWICE A DAY 01/12/21   Shary Key, DO  ?metFORMIN (GLUCOPHAGE-XR) 500 MG 24 hr tablet Take 2 tablets (1,000 mg total) by mouth daily with breakfast. ?Patient not taking: Reported on 05/25/2021 07/23/20  Shary Key, DO  ?Multiple Vitamins-Minerals (MULTIVITAMIN WITH MINERALS) tablet Take 1 tablet by mouth daily.    [provider]  ?NONFORMULARY OR COMPOUNDED ITEM Apply 1-2 g topically 4 (four) times daily. ?Patient not taking: Reported on 05/25/2021 05/15/20   Evelina Bucy, DPM  ?Sodium Hypochlorite (ANASEPT) 0.057 % LIQD Irrigate with as directed. ?Patient not taking: Reported on 05/25/2021 09/06/19   [provider]  ?SPIRIVA HANDIHALER 18 MCG inhalation capsule INHALE 1 CAPSULE VIA HANDIHALER ONCE DAILY AT THE SAME TIME EVERY DAY 04/14/21   Shary Key, DO   ?triamcinolone cream (KENALOG) 0.1 % APPLY TO AFFECTED AREA TWICE A DAY 01/12/21   Paige, Weldon Picking, DO  ?[place] 250-50 MCG/ACT AEPB INHALE 1 PUFF INTO THE LUNGS IN THE MORNING AND AT BEDTIME. 06/08/21   Shary Key, DO  ?XARELTO 2.5 MG TABS tablet Take 2.5 mg by mouth 2 (two) times daily. ?Patient not taking: Reported on 05/25/2021 03/25/19   [provider]  ?   ? ?Allergies    ?Patient has no active allergies.   ? ?Review of Systems   ?Review of Systems  ?Constitutional:  Positive for appetite change, fatigue and fever.  ?Respiratory:  Positive for cough, shortness of breath and wheezing.   ?Cardiovascular:  Negative for chest pain, palpitations and leg swelling.  ?Gastrointestinal:  Positive for diarrhea. Negative for nausea and vomiting.  ?Genitourinary:  Negative for dysuria.  ?Neurological:  Positive for weakness.  ?All other systems reviewed and are negative. ? ?Physical Exam ?Updated Vital Signs ?BP 124/83   Pulse (!) 124   Temp 98.9 ?F (37.2 ?C) (Oral)   Resp (!) 24   SpO2 (!) 89%  ?Physical Exam ?Vitals and nursing note reviewed.  ?Constitutional:   ?   General: He is in acute distress.  ?   Appearance: Normal appearance. He is well-developed. He is ill-appearing. He is not toxic-appearing.  ?HENT:  ?   Head: Normocephalic and atraumatic.  ?   Nose: Nose normal.  ?   Mouth/Throat:  ?   Mouth: Mucous membranes are moist.  ?   Pharynx: Oropharynx is clear.  ?Eyes:  ?   General: No scleral icterus. ?   Extraocular Movements: Extraocular movements intact.  ?   Pupils: Pupils are equal, round, and reactive to light.  ?Neck:  ?   Vascular: No JVD.  ?Cardiovascular:  ?   Rate and Rhythm: Regular rhythm. Tachycardia present.  ?   Pulses: Normal pulses.  ?   Heart sounds: No murmur heard. ?Pulmonary:  ?   Effort: Tachypnea and respiratory distress present.  ?   Breath sounds: Examination of the left-upper field reveals wheezing. Examination of the right-lower field reveals rhonchi and rales.  Examination of the left-lower field reveals rhonchi and rales. Wheezing, rhonchi and rales present.  ?Chest:  ?   Chest wall: No tenderness.  ?Abdominal:  ?   General: Bowel sounds are normal.  ?   Palpations: Abdomen is soft.  ?   Tenderness: There is no abdominal tenderness.  ?Musculoskeletal:     ?   General: Normal range of motion.  ?   Cervical back: Neck supple.  ?   Right lower leg: No edema.  ?   Left lower leg: No edema.  ?Skin: ?   General: Skin is warm and dry.  ?   Capillary Refill: Capillary refill takes less than 2 seconds.  ?Neurological:  ?   General: No focal deficit present.  ?  Mental Status: He is alert and oriented to person, place, and time. Mental status is at baseline.  ?Psychiatric:     ?   Mood and Affect: Mood normal.     ?   Behavior: Behavior normal.     ?   Thought Content: Thought content normal.     ?   Judgment: Judgment normal.  ? ? ?ED Results / Procedures / Treatments   ?Labs ?(all labs ordered are listed, but only abnormal results are displayed) ?Labs Reviewed  ?CBC WITH DIFFERENTIAL/PLATELET - Abnormal; Notable for the following components:  ?    Result Value  ? WBC 14.4 (*)   ? MCV 74.3 (*)   ? MCH 25.6 (*)   ? RDW 15.8 (*)   ? Neutro Abs 11.5 (*)   ? Monocytes Absolute 1.1 (*)   ? Abs Immature Granulocytes 0.08 (*)   ? All other components within normal limits  ?BASIC METABOLIC PANEL - Abnormal; Notable for the following components:  ? CO2 21 (*)   ? Glucose, Bld 210 (*)   ? BUN 51 (*)   ? Creatinine, Ser 2.34 (*)   ? GFR, Estimated 27 (*)   ? All other components within normal limits  ?I-STAT VENOUS BLOOD GAS, ED - Abnormal; Notable for the following components:  ? pCO2, Ven 37.8 (*)   ? pO2, Ven 24 (*)   ? Acid-base deficit 3.0 (*)   ? All other components within normal limits  ?TROPONIN I (HIGH SENSITIVITY) - Abnormal; Notable for the following components:  ? Troponin I (High Sensitivity) 33 (*)   ? All other components within normal limits  ?RESP PANEL BY RT-PCR (RSV, FLU  A&B, COVID)  [id number]  ?CULTURE, BLOOD (ROUTINE X 2)  ?CULTURE, BLOOD (ROUTINE X 2)  ?BRAIN NATRIURETIC PEPTIDE  ?HEPATIC FUNCTION PANEL  ?TROPONIN I (HIGH SENSITIVITY)  ? ?EKG ?None ? ?Radiology ?DG Chest 2 View ?

## 2021-07-01 NOTE — ED Notes (Signed)
Patient to xray via stretcher

## 2021-07-01 NOTE — ED Provider Triage Note (Signed)
Emergency Medicine Provider Triage Evaluation Note ? ?[name] , a 88 y.o. adult  was evaluated in triage.  Pt complains of shortness of breath over the last few days.  Patient denies any chest pain, leg pain, leg swelling, fever, chills, cough.  His shortness of breath is worse with exertion. ? ?Review of Systems  ?Positive:  ?Negative: See above  ? ?Physical Exam  ?There were no vitals taken for this visit. ?Gen:   Awake, no distress   ?Resp:  Normal effort  ?MSK:   Moves extremities without difficulty  ?Other:  Tachycardic. ? ?Medical Decision Making  ?Medically screening exam initiated at 2:26 PM.  Appropriate orders placed.  Artemus Calvin was informed that the remainder of the evaluation will be completed by another provider, this initial triage assessment does not replace that evaluation, and the importance of remaining in the ED until their evaluation is complete. ? ?Orders placed.  If work-up is ultimately unrevealing would consider possible D-dimer versus pulmonary embolism work-up given his tachycardia and new onset shortness of breath and hypoxia.  Patient does not wear oxygen at baseline. ?  ?Hendricks Limes, PA-C ?07/01/21 1431 ? ?

## 2021-07-01 NOTE — ED Notes (Signed)
Patient placed on Bipap. Continue to await IV team for IV access ?

## 2021-07-01 NOTE — Progress Notes (Addendum)
eLink Physician-Brief Progress Note ?Patient Name: [name] ?DOB: January 16, 1934 ?MRN: [id number] ? ? ?Date of Service ? 07/01/2021  ?HPI/Events of Note ? Patient admitted with acute on chronic respiratory failure secondary to acute exacerbation of COPD + subtle pneumonia vs bronchitis, he is currently on BIPAP, work up is in progress. K+ 3.6, Creatinine 2.34.  ?eICU Interventions ? New Patient Evaluation. I discussed code status with the patient in the presence of his bedside nurse, he is fully alert and oriented and confirms that he has a living will which specifies DNR /DNI which he affirms. K+ repleted, a.m. labs ordered.  ? ? ? ?  ? ?Kerry Kass [name] ?07/01/2021, 8:34 PM ?

## 2021-07-01 NOTE — ED Notes (Signed)
Unable to obtain PIV, order put in for IV team to come attempt ?

## 2021-07-01 NOTE — Progress Notes (Signed)
Patient transported on Bipap to 2B63 without complications. ?

## 2021-07-01 NOTE — ED Notes (Signed)
Report called to [place], Therapist, sports. Patient transported with RN and RT on monitor ?

## 2021-07-01 NOTE — Progress Notes (Signed)
Pharmacy Antibiotic Note ? ?[name] is a 88 y.o. adult admitted on 07/01/2021 with pneumonia.  Pharmacy has been consulted for ceftriaxone dosing. ? ? ?Plan: ?-Ceftriaxone 2 gm IV Q 24 hours  ?-Azithromycin 500 mg daily per MD ?-Pharmacy to sign off since no further dosage adjustments necessary  ? ?  ? ?Temp (24hrs), Avg:99.5 ?F (37.5 ?C), Min:98.9 ?F (37.2 ?C), Max:100.1 ?F (37.8 ?C) ? ?Recent Labs  ?Lab 07/01/21 ?1601  ?WBC 14.4*  ?CREATININE 2.34*  ?  ?CrCl cannot be calculated (Unknown ideal weight.).   ? ?No Active Allergies ? ? ? ?Thank you for allowing pharmacy to be a part of this patient?s care. ? ?Albertina Parr, PharmD., BCCCP ?Clinical Pharmacist ?Please refer to [facility] for unit-specific pharmacist  ? ? ?

## 2021-07-01 NOTE — H&P (Signed)
? ?NAME:  [name], MRN:  [id number], DOB:  01/14/1934, LOS: 0 ?ADMISSION DATE:  07/01/2021, CONSULTATION DATE:  4/27 ?REFERRING MD:  teigler, CHIEF COMPLAINT:  PNA and respiratory failure   ? ?History of Present Illness:  ?88 year old male w/ sig hx as per below. Was in usual state of health until 4/23 when he developed increased nasal and chest congestion w/ possible fever. He noted that on 4/24 he began to notice increased shortness of breath, increased fatigue, and diarrhea on 4/26. He described his cough as productive of phlegm until around 4/26 at which time he could no longer produce sputum due to shortness of breath. He presented to the ER 4/27 w/ cc: shortness of breath, fever, cough and wheezing. Of note he ran out of his Bronchodilators on 4/26.  ?In ER hypoxic w/ sats 80s, had to be placed on NIPPV for WOB ?PCXR w/ bibasilar airspace disease.  ?Wbc 14.4, BNP neg ?PCCM asked to eval  ?Pertinent  Medical History  ?COPD, stage IIIb ckd, DM type II, PAD ? ?Significant Hospital Events: ?Including procedures, antibiotic start and stop dates in addition to other pertinent events   ?4/26 admitted w/ a working dx of PNA +/- AECOPD placed on azith/roceph, systemic solumedrol & BDs ? ?Interim History / Subjective:  ?Feels a little better on BIPAP ? ?Objective   ?Blood pressure (Abnormal) 132/104, pulse (Abnormal) 128, temperature 100.1 ?F (37.8 ?C), temperature source Oral, resp. rate (Abnormal) 28, SpO2 90 %. ?   ?FiO2 (%):  [40 %] 40 %  ?No intake or output data in the 24 hours ending 07/01/21 1829 ?There were no vitals filed for this visit. ? ?Examination: ?General: elderly 88 year old male currently on NIPPV. Seems like WOB effort slowly improving  ?HENT: NCAT BIPAP mask in place. No JVD  ?Lungs: bibasilar crackles. No sig wheeze currently. + WOB, PCXR w/ bibasilar airspace disease R>L ?Cardiovascular: tachy rrr ?Abdomen: soft not tender  ?Extremities: warm and dry w/out edema ?Neuro: hard of hearing  but alert and oriented.  ?GU: due to void  ? ?Resolved Hospital Problem list   ? ? ?Assessment & Plan:  ?Principal Problem: ?  Acute respiratory failure with hypoxia (HCC) ?Active Problems: ?  CAP (community acquired pneumonia) ?  COPD exacerbation ([place]) ?  Asthma ?  Essential hypertension ?  Type 2 diabetes mellitus with peripheral neuropathy (HCC) ?  CKD (chronic kidney disease) stage 3, GFR 30-59 ml/min (HCC) ?  Peripheral artery disease ([place]) ?  Hearing loss ?  History of amputation of right great toe ([place]) ? ? ? ?Acute Hypoxic Respiratory Failure 2/2 CAP +/-  AECOPD ?Plan ?Admit to ICU ?BIPAP  ?Pulse ox ?Scheduled BDs ?IV solumedrol  ?Empiric rocephin and azith  ?Send urine strep and legionella antigen  ?RVP  ?Sputum if able ?BLE dopplers  ? ?Sepsis 2/2 CAP ?Plan ?Ck lactate ?Gentle IVFs; if lactate elevated may need to be more aggressive ?Abx as above  ? ?CKD stage 3B ?Looks like baseline cr 1.85; currently 2.34 ?Plan ?IVFs as above ?Renal dose meds ?Serial chems ?Strict I&O ? ?Chronic anemia  ?Plan ?Trend cbc ? ?DM w/ Hyperglycemia ?Plan ?Ssi  ? ? ?Best Practice (right click and "Reselect all SmartList Selections" daily)  ? ?Diet/type: NPO ?DVT prophylaxis: systemic heparin ?GI prophylaxis: N/A ?Lines: N/A ?Foley:  N/A ?Code Status:  full code ?Last date of multidisciplinary goals of care discussion [pending] ? ?Labs   ?CBC: ?Recent Labs  ?Lab 07/01/21 ?1559 07/01/21 ?1601  ?  WBC  --  14.4*  ?NEUTROABS  --  11.5*  ?HGB 14.3 13.8  ?HCT 42.0 40.1  ?MCV  --  74.3*  ?PLT  --  239  ? ? ?Basic Metabolic Panel: ?Recent Labs  ?Lab 07/01/21 ?1559 07/01/21 ?1601  ?NA 136 136  ?K 3.5 3.6  ?CL  --  103  ?CO2  --  21*  ?GLUCOSE  --  210*  ?BUN  --  51*  ?CREATININE  --  2.34*  ?CALCIUM  --  9.2  ? ?GFR: ?CrCl cannot be calculated (Unknown ideal weight.). ?Recent Labs  ?Lab 07/01/21 ?1601  ?WBC 14.4*  ? ? ?Liver Function Tests: ?No results for input(s): AST, ALT, ALKPHOS, BILITOT, PROT, ALBUMIN in the last 168 hours. ?No  results for input(s): LIPASE, AMYLASE in the last 168 hours. ?No results for input(s): AMMONIA in the last 168 hours. ? ?ABG ?   ?Component Value Date/Time  ? PHART 7.321 (L) 04/15/2018 0258  ? PCO2ART 30.9 (L) 04/15/2018 0258  ? PO2ART 154.0 (H) 04/15/2018 0258  ? HCO3 21.9 07/01/2021 1559  ? TCO2 23 07/01/2021 1559  ? ACIDBASEDEF 3.0 (H) 07/01/2021 1559  ? O2SAT 41 07/01/2021 1559  ?  ? ?Coagulation Profile: ?No results for input(s): INR, PROTIME in the last 168 hours. ? ?Cardiac Enzymes: ?No results for input(s): CKTOTAL, CKMB, CKMBINDEX, TROPONINI in the last 168 hours. ? ?HbA1C: ?HbA1c, POC (controlled diabetic range)  ?Date/Time Value Ref Range Status  ?04/09/2021 11:00 AM 8.6 (A) 0.0 - 7.0 % Final  ?01/07/2021 03:15 PM 9.9 (A) 0.0 - 7.0 % Final  ? ? ?CBG: ?No results for input(s): GLUCAP in the last 168 hours. ? ?Review of Systems:   ?Review of Systems  ?Constitutional:  Positive for fever and malaise/fatigue.  ?HENT:  Positive for congestion and hearing loss.   ?Eyes: Negative.   ?Respiratory:  Positive for cough, shortness of breath and wheezing.   ?Gastrointestinal:  Positive for diarrhea.  ?Genitourinary: Negative.   ?Musculoskeletal: Negative.   ?Neurological: Negative.   ?Endo/Heme/Allergies: Negative.   ?Psychiatric/Behavioral: Negative.    ? ? ?Past Medical History:  ?He,  has a past medical history of ANEMIA, IRON DEF, NOS (01/15/2007), Arthritis, Cataract extraction status of left eye (2016), Cataract extraction status of right eye (2016), CHOLECYSTECTOMY, LAPAROSCOPIC, HX OF (04/12/2007), Critical lower limb ischemia ([place]) (10/02/2018), Diabetes mellitus without complication ([place]), Gout (2007), Hypertension, and Toe ulcer ([place]) (09/20/2018).  ? ?Surgical History:  ? ?Past Surgical History:  ?Procedure Laterality Date  ? ABDOMINAL AORTOGRAM W/LOWER EXTREMITY N/A 10/03/2018  ? Procedure: ABDOMINAL AORTOGRAM W/LOWER EXTREMITY;  Surgeon: Marty Heck, MD;  Location: [facility];  Service:  Cardiovascular;  Laterality: N/A;  Bilateral ?  ? CHOLECYSTECTOMY    ?  ? ?Social History:  ? reports that he quit smoking about 10 years ago. His smoking use included cigars and cigarettes. He started smoking about 70 years ago. He has a 30.00 pack-year smoking history. He has been exposed to tobacco smoke. He has never used smokeless tobacco. He reports that he does not drink alcohol and does not use drugs.  ? ?Family History:  ?His family history includes Alcohol abuse in his sister; Arthritis in his daughter and sister; Asthma in his daughter, mother, and sister; Cancer in his brother; Cirrhosis in his sister; Diabetes in his brother and son; Heart disease in his brother and brother; Stroke in his brother and brother.  ? ?Allergies ?No Active Allergies  ? ?Home Medications  ?  Prior to Admission medications   ?Medication Sig Start Date End Date Taking? Authorizing Provider  ?albuterol (PROVENTIL) (2.5 MG/3ML) 0.083% nebulizer solution Take 3 mLs (2.5 mg total) by nebulization every 6 (six) hours as needed for wheezing or shortness of breath. 01/22/20   Shary Key, DO  ?allopurinol (ZYLOPRIM) 300 MG tablet TAKE 1 TABLET BY MOUTH EVERY DAY 03/18/21   Shary Key, DO  ?aspirin 81 MG EC tablet Take by mouth.    [provider]  ?atorvastatin (LIPITOR) 20 MG tablet TAKE 1 TABLET BY MOUTH EVERY DAY ?Patient not taking: Reported on 05/25/2021 01/12/21   Shary Key, DO  ?atorvastatin (LIPITOR) 40 MG tablet Take 1 tablet (40 mg total) by mouth daily. 11/03/20 11/03/21  Shary Key, DO  ?cycloSPORINE (RESTASIS) 0.05 % ophthalmic emulsion INSTILL 1 DROP INTO BOTH EYES TWICE A DAY 04/12/21   Shary Key, DO  ?diclofenac Sodium (VOLTAREN) 1 % GEL Apply 2 g topically 4 (four) times daily. 06/17/20   Shary Key, DO  ?diclofenac Sodium (VOLTAREN) 1 % GEL TAKE ON AFFECTED AREAS AS NEEDED FOR PAIN UP TO 4 TIMES A DAY 05/10/21   Paige, Weldon Picking, DO  ?empagliflozin (JARDIANCE) 25 MG TABS tablet  Take 1 tablet (25 mg total) by mouth daily. 01/07/21   Shary Key, DO  ?gabapentin (NEURONTIN) 300 MG capsule Take 1 capsule (300 mg total) by mouth 4 (four) times daily. 04/20/21   Evelina Bucy, D

## 2021-07-01 NOTE — ED Notes (Signed)
Patient placed on 2 liters via nasal cannula. ?

## 2021-07-01 NOTE — ED Triage Notes (Signed)
Patient brought in by [facility], were called for dyspnea and increased weakness over the last 2-3 days. Daughter had given him a breathing treatment. Ems gave 324 mg of aspirin. Blood sugar was 389. Patient has a history of COPD and DM.  ?

## 2021-07-02 ENCOUNTER — Inpatient Hospital Stay (HOSPITAL_COMMUNITY): Payer: PPO

## 2021-07-02 DIAGNOSIS — R609 Edema, unspecified: Secondary | ICD-10-CM | POA: Diagnosis not present

## 2021-07-02 DIAGNOSIS — J9601 Acute respiratory failure with hypoxia: Secondary | ICD-10-CM | POA: Diagnosis not present

## 2021-07-02 LAB — RESPIRATORY PANEL BY PCR

## 2021-07-02 LAB — BASIC METABOLIC PANEL
Anion gap: 9 (ref 5–15)
BUN: 47 mg/dL — ABNORMAL HIGH (ref 8–23)
CO2: 18 mmol/L — ABNORMAL LOW (ref 22–32)
Calcium: 8.7 mg/dL — ABNORMAL LOW (ref 8.9–10.3)
Chloride: 108 mmol/L (ref 98–111)
Creatinine, Ser: 2 mg/dL — ABNORMAL HIGH (ref 0.61–1.24)
GFR, Estimated: 32 mL/min — ABNORMAL LOW (ref >60)
Glucose, Bld: 265 mg/dL — ABNORMAL HIGH (ref 70–99)
Potassium: 4 mmol/L (ref 3.5–5.1)
Sodium: 135 mmol/L (ref 135–145)

## 2021-07-02 LAB — GLUCOSE, CAPILLARY
Glucose-Capillary: 147 mg/dL — ABNORMAL HIGH (ref 70–99)
Glucose-Capillary: 210 mg/dL — ABNORMAL HIGH (ref 70–99)
Glucose-Capillary: 227 mg/dL — ABNORMAL HIGH (ref 70–99)
Glucose-Capillary: 232 mg/dL — ABNORMAL HIGH (ref 70–99)
Glucose-Capillary: 236 mg/dL — ABNORMAL HIGH (ref 70–99)
Glucose-Capillary: 282 mg/dL — ABNORMAL HIGH (ref 70–99)
Glucose-Capillary: 293 mg/dL — ABNORMAL HIGH (ref 70–99)

## 2021-07-02 LAB — PROCALCITONIN: Procalcitonin: 35.24 ng/mL

## 2021-07-02 LAB — MAGNESIUM: Magnesium: 2 mg/dL (ref 1.7–2.4)

## 2021-07-02 LAB — CBC WITH DIFFERENTIAL/PLATELET
Abs Immature Granulocytes: 0.09 10*3/uL — ABNORMAL HIGH (ref 0.00–0.07)
Basophils Absolute: 0 10*3/uL (ref 0.0–0.1)
Basophils Relative: 0 %
Eosinophils Absolute: 0 10*3/uL (ref 0.0–0.5)
Eosinophils Relative: 0 %
HCT: 37.1 % — ABNORMAL LOW (ref 39.0–52.0)
Hemoglobin: 12.7 g/dL — ABNORMAL LOW (ref 13.0–17.0)
Immature Granulocytes: 1 %
Lymphocytes Relative: 6 %
Lymphs Abs: 1 10*3/uL (ref 0.7–4.0)
MCH: 25.2 pg — ABNORMAL LOW (ref 26.0–34.0)
MCHC: 34.2 g/dL (ref 30.0–36.0)
MCV: 73.8 fL — ABNORMAL LOW (ref 80.0–100.0)
Monocytes Absolute: 0.5 10*3/uL (ref 0.1–1.0)
Monocytes Relative: 3 %
Neutro Abs: 13.5 10*3/uL — ABNORMAL HIGH (ref 1.7–7.7)
Neutrophils Relative %: 90 %
Platelets: 213 10*3/uL (ref 150–400)
RBC: 5.03 MIL/uL (ref 4.22–5.81)
RDW: 15.8 % — ABNORMAL HIGH (ref 11.5–15.5)
WBC: 15.1 10*3/uL — ABNORMAL HIGH (ref 4.0–10.5)
nRBC: 0 % (ref 0.0–0.2)

## 2021-07-02 LAB — MRSA NEXT GEN BY PCR, NASAL: MRSA by PCR Next Gen: NOT DETECTED

## 2021-07-02 LAB — STREP PNEUMONIAE URINARY ANTIGEN: Strep Pneumo Urinary Antigen: NEGATIVE

## 2021-07-02 MED ORDER — CHLORHEXIDINE GLUCONATE CLOTH 2 % EX PADS
6.0000 | MEDICATED_PAD | Freq: Every day | CUTANEOUS | Status: DC
  Administered 2021-07-02: 6 via TOPICAL

## 2021-07-02 MED ORDER — FLUTICASONE FUROATE-VILANTEROL 200-25 MCG/ACT IN AEPB
1.0000 | INHALATION_SPRAY | Freq: Every day | RESPIRATORY_TRACT | Status: DC
  Administered 2021-07-03: 1 via RESPIRATORY_TRACT
  Filled 2021-07-02: qty 28

## 2021-07-02 MED ORDER — PREDNISONE 20 MG PO TABS
20.0000 mg | ORAL_TABLET | Freq: Every day | ORAL | Status: DC
  Administered 2021-07-03: 20 mg via ORAL
  Filled 2021-07-02: qty 1

## 2021-07-02 MED ORDER — INSULIN ASPART 100 UNIT/ML IJ SOLN: 0.0000 [IU] | INTRAMUSCULAR | Status: DC

## 2021-07-02 MED ORDER — INSULIN ASPART 100 UNIT/ML IJ SOLN
0.0000 [IU] | Freq: Three times a day (TID) | INTRAMUSCULAR | Status: DC
  Administered 2021-07-02: 3 [IU] via SUBCUTANEOUS
  Administered 2021-07-02 – 2021-07-03 (×2): 7 [IU] via SUBCUTANEOUS
  Administered 2021-07-03: 4 [IU] via SUBCUTANEOUS

## 2021-07-02 MED ORDER — UMECLIDINIUM BROMIDE 62.5 MCG/ACT IN AEPB
1.0000 | INHALATION_SPRAY | Freq: Every day | RESPIRATORY_TRACT | Status: DC
  Administered 2021-07-03: 1 via RESPIRATORY_TRACT
  Filled 2021-07-02: qty 7

## 2021-07-02 MED ORDER — INSULIN ASPART 100 UNIT/ML IJ SOLN
0.0000 [IU] | Freq: Every day | INTRAMUSCULAR | Status: DC
  Administered 2021-07-03: 3 [IU] via SUBCUTANEOUS

## 2021-07-02 MED ORDER — DOXYCYCLINE HYCLATE 100 MG PO TABS
100.0000 mg | ORAL_TABLET | Freq: Two times a day (BID) | ORAL | Status: DC
  Administered 2021-07-02 – 2021-07-03 (×3): 100 mg via ORAL
  Filled 2021-07-02 (×3): qty 1

## 2021-07-02 NOTE — Progress Notes (Signed)
Inpatient Diabetes Program Recommendations ? ?AACE/ADA: New Consensus Statement on Inpatient Glycemic Control (2015) ? ?Target Ranges:  Prepandial:   less than 140 mg/dL ?     Peak postprandial:   less than 180 mg/dL (1-2 hours) ?     Critically ill patients:  140 - 180 mg/dL  ? ?Lab Results  ?Component Value Date  ? GLUCAP 232 (H) 07/02/2021  ? HGBA1C 7.7 (H) 07/01/2021  ? ?Diabetes history: Type 2 DM ?Outpatient Diabetes medications: Metformin 1000 mg QD (NT), Jardiance 25 mg QD ?Current orders for Inpatient glycemic control: Novolog 0-20 units TID & HS ?Solumedrol 125 mg x 1 to taper to Prednisone 20 mg QA ?Inpatient Diabetes Program Recommendations:   ? ?Consider adding Levemir 8 units QD and switching diet to carb modified.  ? ?Thanks, ?Bronson Curb, MSN, RNC-OB ?Diabetes Coordinator ?[phone] (8a-5p) ? ? ? ? ?

## 2021-07-02 NOTE — Progress Notes (Signed)
Bilateral lower extremity venous duplex has been completed. ?Preliminary results can be found in CV Proc through chart review.  ? ?07/02/21 12:59 PM ?Carlos Levering RVT   ?

## 2021-07-02 NOTE — Progress Notes (Signed)
RT at bedside this AM to find pt on 4L [place] and BiPAP on stby. RT will continue to monitor pt as needed. ?

## 2021-07-02 NOTE — Progress Notes (Signed)
? ?  NAME:  [name], MRN:  [id number], DOB:  1933/07/29, LOS: 1 ?ADMISSION DATE:  07/01/2021, CONSULTATION DATE:  4/28 ?REFERRING MD:  Tiegler, CHIEF COMPLAINT:  Dyspnea  ? ?Brief History of Present Illness:  ?88 y/o male admitted for acute hypoxemic respiratory failure due to COPD exacerbation requiring BIPAP and NIMV in ICU setting. ? ?Pertinent  Medical History  ?COPD, stage IIIb ckd, DM type II, PAD ? ?Significant Hospital Events: ?Including procedures, antibiotic start and stop dates in addition to other pertinent events   ?4/27 admitted w/ a working dx of PNA +/- AECOPD placed on azith/roceph, systemic solumedrol & BDs ?4/28 transitioned off BIPAP after a couple of hours, feeling better, Change antibiotics to doxycycline ? ?Interim History / Subjective:  ?Slept well overnight ?Off BIPAP after a few hours last night ?Wants to eat ?Wants to go home ?On oxygen ?Doesn't feel short of breath ? ?Objective   ?Blood pressure 117/80, pulse 80, temperature (!) 97.4 ?F (36.3 ?C), temperature source Axillary, resp. rate 17, weight 78.9 kg, SpO2 93 %. ?   ?FiO2 (%):  [40 %] 40 %  ? ?Intake/Output Summary (Last 24 hours) at 07/02/2021 0952 ?Last data filed at 07/02/2021 0800 ?Gross per 24 hour  ?Intake 247.72 ml  ?Output 400 ml  ?Net -152.28 ml  ? ?Filed Weights  ? 07/02/21 0500  ?Weight: 78.9 kg  ? ? ?Examination: ?General:  Resting comfortably in bed ?HENT: NCAT OP clear ?PULM: Poor air movement B, normal effort ?CV: RRR, no mgr ?GI: BS+, soft, nontender ?MSK: normal bulk and tone ?Neuro: awake, alert, no distress, MAEW ? ? ?Resolved Hospital Problem list   ? ? ?Assessment & Plan:  ? ?Principal Problem: ?  Acute respiratory failure with hypoxia ([place]) ?Active Problems: ?  Asthma ?  Essential hypertension ?  Type 2 diabetes mellitus with peripheral neuropathy (HCC) ?  COPD exacerbation ([place]) ?  CKD (chronic kidney disease) stage 3, GFR 30-59 ml/min (HCC) ?  Peripheral artery disease ([place]) ?  Hearing loss ?  History of  amputation of right great toe ([place]) ?  CAP (community acquired pneumonia) ? ? ?Acute hypoxemic respiratory failure due to COPD exacerbation and possibly CAP > improved ?D/c NIMV ?Add Breo/Incruise for while in hospital, at home can resume [place] and Spiriva ?Continue albuterol as ordered ?Change solumedrol to prednisone 20mg  daily x 5 days ?Change antibiotics to doxycycline ?Wean off O2 for O2 saturation > 88% ?Out of bed, ambulate today ? ?Sepsis secondary to CAP, no organism specified ?Change antibiotics to doxycycline x5 days ? ?CKD stage 3B ?Monitor BMET and UOP ?Replace electrolytes as needed ?D/c IV fluids ? ?Chronic anemia ?Monitor for bleeding ?Transfuse PRBC for Hgb < 7 gm/dL ? ?DM2 with hyperglycemia worsened by steroids ?SSI > increase SSI ? ?Best Practice (right click and "Reselect all SmartList Selections" daily)  ? ?Diet/type: Regular consistency (see orders) ?DVT prophylaxis: prophylactic heparin  ?GI prophylaxis: N/A ?Lines: N/A ?Foley:  N/A ?Code Status:  DNR ?Last date of multidisciplinary goals of care discussion [4/27] ? ?Transfer to FPTS ? ?Critical care time: n/a ?  ? ? ?Roselie Awkward, MD ?[facility] ?Pager: [phone] ?Cell: [phone] ?After 7:00 pm call Elink  [phone] ? ? ? ?

## 2021-07-02 NOTE — Progress Notes (Addendum)
Discussed case with Dr. Lake Bells and patient was admitted to ICU for COPD exacerbation requiring BiPap. Patient now appears stable for the floor. FPTS team will take over care of this patient officially at 4/29 at 7am.  ? ? ? ?[name], DO  ?PGY 2, Family Medicine ?Pager [phone] Please contact ICU attending/service until our team takes over on 4/29. ?

## 2021-07-02 NOTE — Hospital Course (Addendum)
[name] is a 88 y.o.male with a history of asthma, HTN, T2DM, CKD, PAD, hearing loss, gout, COPD, grief who was admitted to the [facility] Medicine Teaching Service at [facility] for acute hypoxic respiratory failure. His hospital course is detailed below: ? ?Acute Hypoxic Respiratory Failure  ?Sepsis secondary to CAP +/- COPD exacerbation  ?In the ED patient was hypoxic with sats in 80s and placed on non-invasive mechanical ventilation. Initial CXR with bibasilar airspace disease, WBC 14.4, BNP negative. He was admitted to the ICU given his need for bipap and noninvasive mechanical ventilation.  He was treated for CAP and acute COPD exacerbation with IV steroids, empiric antibiotics, and gentle fluid resuscitation. DVT U/S negative. He was able to be transitioned off of BiPAP the following day, his IV antibiotics were transitioned to doxycycline.  He was successfully transitioned back to FPTS service on 07/03/2021, saturating on room air and was able to ambulate with oxygen requirement. Patient instructed to finish doxycycline and prednisone to complete a total of 5 days of treatment.  ? ?AKI on CKD stage IIIb ?Creatinine elevated to 2.34 (BL ~1.8) on arrival- likely in the setting of sepsis, improved with gentle fluid resuscitation. Creatinine on d/c 1.94.  ? ?T2DM ?CBGs elevated, likely secondary to steroid use. Patient was given sliding scale insulin and closely monitored. Patient's home Vania Rea was restarted on day of discharge. Patient to continue home medications of Jardiance 25mg  and Metformin 1000mg  BID.  ? ?Other chronic conditions were medically managed with home medications and formulary alternatives as necessary. ? ?PCP Follow-up Recommendations: ?Had slight microcytic anemia on admission (Hgb 12.7, MCV 73). No obvious signs of bleeding. Consider shared decision making for additional work up.  ?Ensure compliance with COPD medications ?Determine if Atorvastatin 20mg  or 40mg  is the appropriate as the  patient is not taking Atorvastatin 40mg  and the last prescription in November 2022 was for the 20mg .  ?Ensure patient's medication list is up to date as patient reports not taking several medications ?Recheck BMP to monitor Cr and GFR on follow-up ?

## 2021-07-03 DIAGNOSIS — N1832 Chronic kidney disease, stage 3b: Secondary | ICD-10-CM

## 2021-07-03 DIAGNOSIS — J189 Pneumonia, unspecified organism: Secondary | ICD-10-CM

## 2021-07-03 DIAGNOSIS — J441 Chronic obstructive pulmonary disease with (acute) exacerbation: Secondary | ICD-10-CM | POA: Diagnosis not present

## 2021-07-03 DIAGNOSIS — I739 Peripheral vascular disease, unspecified: Secondary | ICD-10-CM

## 2021-07-03 DIAGNOSIS — E1142 Type 2 diabetes mellitus with diabetic polyneuropathy: Secondary | ICD-10-CM

## 2021-07-03 DIAGNOSIS — J9601 Acute respiratory failure with hypoxia: Secondary | ICD-10-CM | POA: Diagnosis not present

## 2021-07-03 LAB — BASIC METABOLIC PANEL
Anion gap: 10 (ref 5–15)
BUN: 58 mg/dL — ABNORMAL HIGH (ref 8–23)
CO2: 18 mmol/L — ABNORMAL LOW (ref 22–32)
Calcium: 9 mg/dL (ref 8.9–10.3)
Chloride: 109 mmol/L (ref 98–111)
Creatinine, Ser: 1.94 mg/dL — ABNORMAL HIGH (ref 0.61–1.24)
GFR, Estimated: 33 mL/min — ABNORMAL LOW (ref >60)
Glucose, Bld: 302 mg/dL — ABNORMAL HIGH (ref 70–99)
Potassium: 3.7 mmol/L (ref 3.5–5.1)
Sodium: 137 mmol/L (ref 135–145)

## 2021-07-03 LAB — HEMOGLOBIN AND HEMATOCRIT, BLOOD
HCT: 36.9 % — ABNORMAL LOW (ref 39.0–52.0)
Hemoglobin: 13 g/dL (ref 13.0–17.0)

## 2021-07-03 LAB — GLUCOSE, CAPILLARY
Glucose-Capillary: 230 mg/dL — ABNORMAL HIGH (ref 70–99)
Glucose-Capillary: 210 mg/dL — ABNORMAL HIGH (ref 70–99)
Glucose-Capillary: 171 mg/dL — ABNORMAL HIGH (ref 70–99)
Glucose-Capillary: 160 mg/dL — ABNORMAL HIGH (ref 70–99)

## 2021-07-03 MED ORDER — PREDNISONE 20 MG PO TABS: 20.0000 mg | ORAL_TABLET | Freq: Every day | ORAL | 0 refills | Status: DC

## 2021-07-03 MED ORDER — DOXYCYCLINE HYCLATE 100 MG PO TABS
100.0000 mg | ORAL_TABLET | Freq: Two times a day (BID) | ORAL | 0 refills | Status: AC
Start: 2021-07-03 — End: 2021-07-06

## 2021-07-03 MED ORDER — EMPAGLIFLOZIN 25 MG PO TABS
25.0000 mg | ORAL_TABLET | Freq: Every day | ORAL | Status: DC
  Administered 2021-07-03: 25 mg via ORAL
  Filled 2021-07-03: qty 1

## 2021-07-03 NOTE — Evaluation (Signed)
Physical Therapy Evaluation ?Patient Details ?Name: [name] ?MRN: [id number] ?DOB: 1933-06-12 ?Today's Date: 07/03/2021 ? ?History of Present Illness ? 88 yo admitted 4/27 with dyspnea and acute respiratory failure due to COPD exacerbation. PMhx: COPD, CKD, DM, PAD, HTN  ?Clinical Impression ? Pt pleasant and eager to move to prepare for D/C. PT lives with daughter and does not wear O2 at baseline. Pt able to don shoes(slip on) and walk in hall with 2 slight LOB with education and encouragement for use of cane for increased safety and stability. Pt with decreased activity tolerance and will benefit from acute therapy to maximize function prior to return home.  ?HR 111-125 with gait   ?   ? ?Recommendations for follow up therapy are one component of a multi-disciplinary discharge planning process, led by the attending physician.  Recommendations may be updated based on patient status, additional functional criteria and insurance authorization. ? ?Follow Up Recommendations No PT follow up ? ?  ?Assistance Recommended at Discharge Intermittent Supervision/Assistance  ?Patient can return home with the following ? Assistance with cooking/housework;Assist for transportation ? ?  ?Equipment Recommendations None recommended by PT  ?Recommendations for Other Services ?    ?  ?Functional Status Assessment Patient has not had a recent decline in their functional status  ? ?  ?Precautions / Restrictions Precautions ?Precautions: Fall  ? ?  ? ?Mobility ? Bed Mobility ?Overal bed mobility: Modified Independent ?  ?  ?  ?  ?  ?  ?General bed mobility comments: HOB 30 degrees ?  ? ?Transfers ?Overall transfer level: Modified independent ?  ?  ?  ?  ?  ?  ?  ?  ?  ?  ? ?Ambulation/Gait ?Ambulation/Gait assistance: Min guard ?Gait Distance (Feet): 140 Feet ?Assistive device: None ?Gait Pattern/deviations: Step-through pattern, Decreased stride length ?  ?Gait velocity interpretation: >2.62 ft/sec, indicative of community  ambulatory ?  ?General Gait Details: decreased speed with 2 slight periods of unsteadiness with tactile cues to recover and sats 90-92% on RA ? ?Stairs ?Stairs: Yes ?Stairs assistance: Modified independent (Device/Increase time) ?Stair Management: Two rails, Alternating pattern, Forwards ?Number of Stairs: 2 ?General stair comments: pt with steady gait with use of rails ? ?Wheelchair Mobility ?  ? ?Modified Rankin (Stroke Patients Only) ?  ? ?  ? ?Balance Overall balance assessment: Mild deficits observed, not formally tested ?  ?  ?  ?  ?  ?  ?  ?  ?  ?  ?  ?  ?  ?  ?  ?  ?  ?  ?   ? ? ? ?Pertinent Vitals/Pain Pain Assessment ?Pain Assessment: No/denies pain  ? ? ?Home Living Family/patient expects to be discharged to:: Private residence ?Living Arrangements: Children ?Available Help at Discharge: Family;Available 24 hours/day ?Type of Home: House ?Home Access: Stairs to enter ?Entrance Stairs-Rails: Right;Left ?Entrance Stairs-Number of Steps: 2 ?  ?Home Layout: One level ?Home Equipment: Conservation officer, nature (2 wheels);Cane - single point;Shower seat ?   ?  ?Prior Function Prior Level of Function : Independent/Modified Independent ?  ?  ?  ?  ?  ?  ?Mobility Comments: pt reports he doesn't walk far and usually makes sure there is something near by to grab but doesn't always furniture walk ?ADLs Comments: pt reports he performs on his own ?  ? ? ?Hand Dominance  ?   ? ?  ?Extremity/Trunk Assessment  ? Upper Extremity Assessment ?Upper Extremity Assessment: Overall WFL for tasks  assessed ?  ? ?Lower Extremity Assessment ?Lower Extremity Assessment: Generalized weakness ?  ? ?Cervical / Trunk Assessment ?Cervical / Trunk Assessment: Normal  ?Communication  ? Communication: HOH  ?Cognition Arousal/Alertness: Awake/alert ?Behavior During Therapy: [facility] for tasks assessed/performed ?Overall Cognitive Status: Within Functional Limits for tasks assessed ?  ?  ?  ?  ?  ?  ?  ?  ?  ?  ?  ?  ?  ?  ?  ?  ?  ?  ?  ? ?  ?General  Comments   ? ?  ?Exercises    ? ?Assessment/Plan  ?  ?PT Assessment Patient needs continued PT services  ?PT Problem List Decreased mobility;Decreased activity tolerance;Decreased balance ? ?   ?  ?PT Treatment Interventions Gait training;Balance training;Functional mobility training;Therapeutic activities;Patient/family education   ? ?PT Goals (Current goals can be found in the Care Plan section)  ?Acute Rehab PT Goals ?Patient Stated Goal: return home ?PT Goal Formulation: With patient ?Time For Goal Achievement: 07/17/21 ?Potential to Achieve Goals: Fair ? ?  ?Frequency Min 2X/week ?  ? ? ?Co-evaluation   ?  ?  ?  ?  ? ? ?  ?AM-PAC PT "6 Clicks" Mobility  ?Outcome Measure Help needed turning from your back to your side while in a flat bed without using bedrails?: None ?Help needed moving from lying on your back to sitting on the side of a flat bed without using bedrails?: None ?Help needed moving to and from a bed to a chair (including a wheelchair)?: None ?Help needed standing up from a chair using your arms (e.g., wheelchair or bedside chair)?: A Little ?Help needed to walk in hospital room?: A Little ?Help needed climbing 3-5 steps with a railing? : None ?6 Click Score: 22 ? ?  ?End of Session   ?Activity Tolerance: Patient tolerated treatment well ?Patient left: in chair;with call bell/phone within reach;with chair alarm set;with nursing/sitter in room ?Nurse Communication: Mobility status ?PT Visit Diagnosis: Other abnormalities of gait and mobility (R26.89) ?  ? ?Time: 1240-1300 ?PT Time Calculation (min) (ACUTE ONLY): 20 min ? ? ?Charges:   PT Evaluation ?$PT Eval Moderate Complexity: 1 Mod ?  ?  ?   ? ? ?Jette Lewan P, PT ?Acute Rehabilitation Services ?Pager: [phone] ?Office: [phone] ? ? ?Estrellita Lasky B Alfonso Carden ?07/03/2021, 1:24 PM ? ?

## 2021-07-03 NOTE — Discharge Summary (Signed)
Family Medicine Teaching Service ?Hospital Discharge Summary ? ?Patient name: [name] Medical record number: [id number] ?Date of birth: 04/28/1933 Age: 88 y.o. Gender: adult ?Date of Admission: 07/01/2021  Date of Discharge: 07/03/2021 ?Admitting Physician: Kipp Brood, MD ? ?Primary Care Provider: Shary Key, DO ?Consultants: PCCM ? ?Indication for Hospitalization: Dyspnea ? ?Discharge Diagnoses/Problem List:  ?Present on Admission: ? Acute respiratory failure with hypoxia ([place]) ? CKD (chronic kidney disease) stage 3, GFR 30-59 ml/min (HCC) ? Essential hypertension ? COPD exacerbation ([place]) ? Asthma ? History of amputation of right great toe ([place]) ? Peripheral artery disease ([place]) ? Type 2 diabetes mellitus with peripheral neuropathy (HCC) ? ?Disposition: Home ? ?Discharge Condition: Stable, improved ? ?Discharge Exam:  ?General: Elderly male, NAD, sitting up in bed ?Cardiovascular: RRR, no murmur appreciated ?Respiratory: speaking in full sentences, comfortable on room air, no increased WOB, no wheezing present ?Abdomen: soft, non-tender, non-distended ?Extremities: moving all equally and appropriately, no peripheral edema noted ? ?Brief Hospital Course:  ?[name] is a 88 y.o.male with a history of asthma, HTN, T2DM, CKD, PAD, hearing loss, gout, COPD, grief who was admitted to the [facility] Medicine Teaching Service at [facility] for acute hypoxic respiratory failure. His hospital course is detailed below: ? ?Acute Hypoxic Respiratory Failure  ?Sepsis secondary to CAP +/- COPD exacerbation  ?In the ED patient was hypoxic with sats in 80s and placed on non-invasive mechanical ventilation. Initial CXR with bibasilar airspace disease, WBC 14.4, BNP negative. He was admitted to the ICU given his need for bipap and noninvasive mechanical ventilation.  He was treated for CAP and acute COPD exacerbation with IV steroids, empiric antibiotics, and gentle fluid resuscitation. DVT U/S negative. He was able  to be transitioned off of BiPAP the following day, his IV antibiotics were transitioned to doxycycline.  He was successfully transitioned back to FPTS service on 07/03/2021, saturating on room air and was able to ambulate with oxygen requirement. Patient instructed to finish doxycycline and prednisone to complete a total of 5 days of treatment.  ? ?AKI on CKD stage IIIb ?Creatinine elevated to 2.34 (BL ~1.8) on arrival- likely in the setting of sepsis, improved with gentle fluid resuscitation. Creatinine on d/c 1.94.  ? ?T2DM ?CBGs elevated, likely secondary to steroid use. Patient was given sliding scale insulin and closely monitored. Patient's home Vania Rea was restarted on day of discharge. Patient to continue home medications of Jardiance 25mg  and Metformin 1000mg  BID.  ? ?Other chronic conditions were medically managed with home medications and formulary alternatives as necessary. ? ?PCP Follow-up Recommendations: ?Had slight microcytic anemia on admission (Hgb 12.7, MCV 73). No obvious signs of bleeding. Consider shared decision making for additional work up.  ?Ensure compliance with COPD medications ?Determine if Atorvastatin 20mg  or 40mg  is the appropriate as the patient is not taking Atorvastatin 40mg  and the last prescription in November 2022 was for the 20mg .  ?Ensure patient's medication list is up to date as patient reports not taking several medications ?Recheck BMP to monitor Cr and GFR on follow-up ? ? ?Significant Procedures:  ? ?Significant Labs and Imaging:  ?Recent Labs  ?Lab 07/01/21 ?1601 07/02/21 ?0203 07/03/21 ?0433  ?WBC 14.4* 15.1*  --   ?HGB 13.8 12.7* 13.0  ?HCT 40.1 37.1* 36.9*  ?PLT 239 213  --   ? ?Recent Labs  ?Lab 07/01/21 ?1559 07/01/21 ?1601 07/01/21 ?2001 07/02/21 ?0203 07/03/21 ?0126  ?NA 136 136  --  135 137  ?K 3.5 3.6  --  4.0 3.7  ?  CL  --  103  --  108 109  ?CO2  --  21*  --  18* 18*  ?GLUCOSE  --  210*  --  265* 302*  ?BUN  --  51*  --  47* 58*  ?CREATININE  --  2.34*  --   2.00* 1.94*  ?CALCIUM  --  9.2  --  8.7* 9.0  ?MG  --   --   --  2.0  --   ?ALKPHOS  --   --  52  --   --   ?AST  --   --  37  --   --   ?ALT  --   --  37  --   --   ?ALBUMIN  --   --  2.6*  --   --   ? ? ?DG Chest 2 View ? ?Result Date: 07/01/2021 ?CLINICAL DATA:  Shortness of breath.  Hyperglycemia. EXAM: CHEST - 2 VIEW COMPARISON:  AP chest 04/16/2018, 07/17/2017, 02/17/2014; CT chest 08/25/2017 FINDINGS: Cardiac silhouette and mediastinal contours are within normal limits. Mild calcification within aortic arch. Bibasilar horizontal linear scarring is similar to prior. Interval clearance of the prior left midlung perihilar opacity on 04/16/2018 radiographs. Probable small bilateral pleural effusions. No pneumothorax. Mild multilevel degenerative disc changes of the thoracic spine. IMPRESSION: There is bibasilar chronic scarring with small bilateral pleural effusions. Cannot exclude mild underlying pneumonia, however no definite new airspace opacity is seen compared to 04/16/2018. Electronically Signed   By: [name] M.D.   On: 07/01/2021 15:07  ? ?VAS [place] LOWER EXTREMITY VENOUS (DVT) ?Summary: RIGHT: - There is no evidence of deep vein thrombosis in the lower extremity.  - No cystic structure found in the popliteal fossa.  LEFT: - There is no evidence of deep vein thrombosis in the lower extremity.  - No cystic structure found in the popliteal fossa.   Electronically signed by Orlie Pollen on 07/02/2021 at 6:03:19 PM.     ? ? ?Results/Tests Pending at Time of Discharge: none ? ?Discharge Medications:  ?Allergies as of 07/03/2021   ?No Known Allergies ?  ? ?  ?Medication List  ?  ? ?STOP taking these medications   ? ?NONFORMULARY OR COMPOUNDED ITEM ?  ?Xarelto 2.5 MG Tabs tablet ?Generic drug: rivaroxaban ?  ? ?  ? ?TAKE these medications   ? ?albuterol (2.5 MG/3ML) 0.083% nebulizer solution ?Commonly known as: PROVENTIL ?Take 3 mLs (2.5 mg total) by nebulization every 6 (six) hours as needed for wheezing or  shortness of breath. ?  ?allopurinol 300 MG tablet ?Commonly known as: ZYLOPRIM ?TAKE 1 TABLET BY MOUTH EVERY DAY ?  ?aspirin 81 MG EC tablet ?Take by mouth. ?  ?atorvastatin 20 MG tablet ?Commonly known as: LIPITOR ?TAKE 1 TABLET BY MOUTH EVERY DAY ?What changed: Another medication with the same name was removed. Continue taking this medication, and follow the directions you see here. ?  ?cycloSPORINE 0.05 % ophthalmic emulsion ?Commonly known as: RESTASIS ?INSTILL 1 DROP INTO BOTH EYES TWICE A DAY ?  ?diclofenac Sodium 1 % Gel ?Commonly known as: Voltaren ?Apply 2 g topically 4 (four) times daily. ?  ?doxycycline 100 MG tablet ?Commonly known as: VIBRA-TABS ?Take 1 tablet (100 mg total) by mouth 2 (two) times daily for 3 days. ?  ?empagliflozin 25 MG Tabs tablet ?Commonly known as: JARDIANCE ?Take 1 tablet (25 mg total) by mouth daily. ?  ?gabapentin 300 MG capsule ?Commonly known as: NEURONTIN ?Take 1 capsule (  300 mg total) by mouth 4 (four) times daily. ?What changed:  ?how much to take ?when to take this ?additional instructions ?  ?levocetirizine 5 MG tablet ?Commonly known as: XYZAL ?TAKE 1 TABLET BY MOUTH TWICE A DAY ?  ?metFORMIN 500 MG 24 hr tablet ?Commonly known as: GLUCOPHAGE-XR ?Take 2 tablets (1,000 mg total) by mouth daily with breakfast. ?  ?multivitamin with minerals tablet ?Take 1 tablet by mouth daily. ?  ?predniSONE 20 MG tablet ?Commonly known as: DELTASONE ?Take 1 tablet (20 mg total) by mouth daily with breakfast. ?Start taking on: July 04, 2021 ?  ?Spiriva HandiHaler 18 MCG inhalation capsule ?Generic drug: tiotropium ?INHALE 1 CAPSULE VIA HANDIHALER ONCE DAILY AT THE SAME TIME EVERY DAY ?What changed: See the new instructions. ?  ?triamcinolone cream 0.1 % ?Commonly known as: KENALOG ?APPLY TO AFFECTED AREA TWICE A DAY ?What changed: See the new instructions. ?  ?Wixela Inhub 250-50 MCG/ACT Aepb ?Generic drug: fluticasone-salmeterol ?INHALE 1 PUFF INTO THE LUNGS IN THE MORNING AND AT  BEDTIME. ?What changed:  ?when to take this ?additional instructions ?  ? ?  ? ? ?Discharge Instructions: Please refer to Patient Instructions section of EMR for full details.  Patient was counseled important

## 2021-07-03 NOTE — Progress Notes (Addendum)
Family Medicine Teaching Service ?Daily Progress Note ?Intern Pager: [phone] ? ?Patient name: [name] Medical record number: [id number] ?Date of birth: Aug 12, 1933 Age: 88 y.o. Gender: adult ? ?Primary Care Provider: Shary Key, DO ?Consultants: PCCM ?Code Status: DNR ? ?Pt Overview and Major Events to Date:  ?4/27 - admitted to ICU on BiPap with azithromycin/CTX ?4/28 - transitioned off BiPap, abx changed to doxycycline ? ?Assessment and Plan: ?[name] is a 88 y.o. adult that presented with dyspnea found to have acute hypoxemic respiratory failure from COPD exacerbation. PMH significant for COPD, stage IIIb CKD, T2DM, PAD, HTN (not on medications) ? ?Acute on chronic respiratory failure 2/2 COPD exacerbation Asthma ?Initially admitted to ICU on BiPap, was able to transition after a few hours.  Patient initially received ceftriaxone and azithromycin x1 dose and was transitioned to oral doxycycline.  Patient received IV Solu-Medrol on 4/27 and was transition to oral prednisone (does not appear to have had any steroid dosing on 4/28).  Oxygen requirement is improving.  BLE Dopplers were DVT. ?- Monitor respiratory status ?- Breo/incruse inhalers (home medications not on formulary) ?- Albuterol PRN ?- Prednisone 20mg  daily (day 2/5) ?- Continue doxycyline (day 2/5) ?- Wean O2 as able ?- Maintain O2 saturation 88-92% ?- Ambulate with pulse ox ? ?Sepsis 2/2 presumed CAP ?CXR on admission with bibasilar chronic scarring with small bilateral pleural effusions that cannot exclude underlying pneumonia.  ?- Continue doxycyline (day 2/5) ?- Ambulate with pulse ox ? ?CKD stage 3B ?Creatinine 1.94, unclear baseline but most recently around 1.85. ?- Monitor UOP ?- Monitor BMP ? ?Type 2 DM ?In the last 24 hours, patient has received 20 units NovoLog. Likely worsened secondary to steroid use. Home medications include metformin 1000 mg twice daily, Jardiance 25 mg daily, gabapentin (300 mg a.m., 600 mg  nightly). Last A1c 8.6 2 months prior ?- Hold home metformin ?- Restart home Jardiance 25 mg daily ?- Resistant SSI ?- NovoLog at bedtime coverage ? ? ?FEN/GI: Regular diet ?PPx: Heparin subcu ?Dispo:Pending PT recommendations  tomorrow. Barriers include continued medical evaluation.  ? ?Subjective:  ?Patient reports that his breathing has significant improved.  He notes that he does not have any oxygen at home.  He is concerned about being able to get home this weekend as his daughter has an appointment on Monday and he has an appoint with his PCP on Wednesday. ?Patient has no acute complaints at this time. ? ?Objective: ?Temp:  [97.8 ?F (36.6 ?C)-98.9 ?F (37.2 ?C)] 97.9 ?F (36.6 ?C) (04/29 1696) ?Pulse Rate:  [74-93] 93 (04/29 0817) ?Resp:  [16-20] 20 (04/29 0817) ?BP: (129-131)/(83-94) 130/83 (04/29 0817) ?SpO2:  [93 %-96 %] 94 % (04/29 0817) ?Physical Exam: ?General: Elderly male, NAD, sitting up in bed ?Cardiovascular: RRR, no murmur appreciated ?Respiratory: speaking in full sentences, comfortable on 1L [place], intermittent coughing episodes, no wheezing, minimal fine crackles diffusely ?Abdomen: soft, non-tender, non-distended ?Extremities: moving all equally and appropriately, no peripheral edema noted ? ?Laboratory: ?Recent Labs  ?Lab 07/01/21 ?1601 07/02/21 ?0203 07/03/21 ?0433  ?WBC 14.4* 15.1*  --   ?HGB 13.8 12.7* 13.0  ?HCT 40.1 37.1* 36.9*  ?PLT 239 213  --   ? ?Recent Labs  ?Lab 07/01/21 ?1601 07/01/21 ?2001 07/02/21 ?0203 07/03/21 ?0126  ?NA 136  --  135 137  ?K 3.6  --  4.0 3.7  ?CL 103  --  108 109  ?CO2 21*  --  18* 18*  ?BUN 51*  --  47* 58*  ?  CREATININE 2.34*  --  2.00* 1.94*  ?CALCIUM 9.2  --  8.7* 9.0  ?PROT  --  6.6  --   --   ?BILITOT  --  0.9  --   --   ?ALKPHOS  --  71  --   --   ?ALT  --  37  --   --   ?AST  --  37  --   --   ?GLUCOSE 210*  --  265* 302*  ? ? ? ? ?Imaging/Diagnostic Tests: ?VAS [place] LOWER EXTREMITY VENOUS (DVT) ?Summary: RIGHT: - There is no evidence of deep vein thrombosis in  the lower extremity.  - No cystic structure found in the popliteal fossa.  LEFT: - There is no evidence of deep vein thrombosis in the lower extremity.  - No cystic structure found in the popliteal fossa.   Electronically signed by [name] on 07/02/2021 at 6:03:19 PM.     ? ?[name], DO ?07/03/2021, 8:43 AM ?PGY-2, [place] ?[place] Intern pager: [phone], text pages welcome ? ?

## 2021-07-03 NOTE — Progress Notes (Signed)
Patient alert and oriented, both patient and daughter Elmo Putt verbalized understanding of dc instructions. All belongings and instructions docs given to patient. ?

## 2021-07-03 NOTE — Plan of Care (Signed)
  Problem: Education: Goal: Knowledge of disease or condition will improve Outcome: Progressing   Problem: Activity: Goal: Ability to tolerate increased activity will improve Outcome: Progressing   Problem: Respiratory: Goal: Ability to maintain a clear airway will improve Outcome: Progressing   

## 2021-07-03 NOTE — Discharge Instructions (Signed)
Dear [name],  ? ?Thank you so much for allowing [place] to be part of your care!  You were admitted to [facility] for COPD exacerbation and required a brief stay in the ICU due to your difficulty with breathing. You were able to quickly be weaned off of oxygen and when walking, you did not require oxygen. You were started on antibiotics (doxycyline) and steroids (prednisone) that you should continue to take as prescribed.   ? ? ?POST-HOSPITAL & CARE INSTRUCTIONS ?Make sure to pick up your antibiotic and steroid for treatment ?Make sure to use your inhalers ?Use your albuterol as needed every 2-4 hours for the next several days. ?Make sure to follow-up with your PCP at your appointment on 5/3 ?Please let PCP/Specialists know of any changes that were made.  ?Please see medications section of this packet for any medication changes.  ? ?DOCTOR'S APPOINTMENT & FOLLOW UP CARE INSTRUCTIONS  ?Future Appointments  ?Date Time Provider [place]  ?07/07/2021  3:10 PM Shary Key, DO [facility]  ?08/10/2021  1:15 PM Price, Christian Mate, DPM [facility]  ? ? ?RETURN PRECAUTIONS: ?- Worsening shortness of breath ?- Fever, increased fatigue ? ?Take care and be well! ? ?Family Medicine Teaching Service  ?[place]  ?[facility]  ?[address] [place], [place] [address] ?[phone] ? ?

## 2021-07-06 DIAGNOSIS — J9601 Acute respiratory failure with hypoxia: Secondary | ICD-10-CM | POA: Diagnosis not present

## 2021-07-06 LAB — LEGIONELLA PNEUMOPHILA SEROGP 1 UR AG: L. pneumophila Serogp 1 Ur Ag: NEGATIVE

## 2021-07-07 ENCOUNTER — Ambulatory Visit (INDEPENDENT_AMBULATORY_CARE_PROVIDER_SITE_OTHER): Payer: PPO | Admitting: Family Medicine

## 2021-07-07 ENCOUNTER — Other Ambulatory Visit: Payer: Self-pay

## 2021-07-07 DIAGNOSIS — J449 Chronic obstructive pulmonary disease, unspecified: Secondary | ICD-10-CM | POA: Diagnosis not present

## 2021-07-07 MED ORDER — ATORVASTATIN CALCIUM 40 MG PO TABS: 40.0000 mg | ORAL_TABLET | Freq: Every day | ORAL | 3 refills | Status: DC

## 2021-07-07 NOTE — Patient Instructions (Addendum)
It was great seeing you today! ? ?You came in for follow-up after hospitalization, and we checked your oxygen saturation which was normal given your COPD.  Commend continuing your Wixela and Spiriva inhalers daily, and using the albuterol as you needed for shortness of breath.  Sure to take it easy over this next week as you continue to recover. ? ?Return if you experience difficulty breathing that has not improved with your albuterol, you have new chest pain, weakness, fevers at home, or any other new and concerning symptoms. ? ?Feel free to call with any questions or concerns at any time, at [phone]. ?  ?Take care,  ?Dr. Shary Key ?[place] ? ?

## 2021-07-07 NOTE — Progress Notes (Signed)
    SUBJECTIVE:   CHIEF COMPLAINT / HPI:   [name] is an 88 yo who presents for hospital follow up   Patient was hospitalized from 4/27-4/29 for acute hypoxic respiratory failure secondary to CAP +/- COPD exacerbation. Patient states he wasn't feeling good for 3-4 days prior. Presented to ED hypoxic with sats in 80s and placed on non-invasive mechanical ventilation. Initial CXR with bibasilar airspace disease, WBC 14.4, BNP negative. He was admitted to the ICU given his need for bipap and noninvasive mechanical ventilation.  He was treated for CAP and acute COPD exacerbation with IV steroids, empiric antibiotics, and gentle fluid resuscitation. He was successfully transitioned back to [place] on 07/03/2021, saturating on room air and was able to ambulate without oxygen requirement. Patient instructed to finish doxycycline and prednisone to complete a total of 5 days of treatment.   Today he endorses still having some SOB. States he can only walk a few feet before feeling SOB. Denies fever at home. Has just been coughing a little, improved from before. Completed prednisone (3 tablets) and his doxycycline. Takes spiriva once daily, Wixela BID, and albuterol about 2-3 times a day. There was question about Atorvastatin dose and we ensured he is to take 40mg  daily. Patient declines blood draw today. He also has expressed in the past and currently that he has no desire to see nephrology.  Patient has a living will in place and he asks if it is in his chart which I was not able to see. Recommended he bring that in at next visit     OBJECTIVE:   BP (!) 151/85   Pulse (!) 105   Wt 176 lb 3.2 oz (79.9 kg)   SpO2 93%   BMI 24.23 kg/m    General: alert, NAD CV: tachycardic, no murmurs Resp: CTAB. Normal WOB on RA  GI: soft, non distended Extremities: warm, dry. No LE edema   ASSESSMENT/PLAN:   Chronic obstructive pulmonary disease ([place]) Patient presents for follow up after his  hospitalization from 4/27-4/29 where he was admitted for acute hypoxic respiratory failure secondary to CAP +/- COPD exacerbation requiring 1 day in ICU. Today he reports feeling improved but still with dyspnea especially with exertion. Completed prednisone course and his doxycycline. Takes spiriva once daily, Wixela BID, and albuterol about 2-3 times a day. Reviewed inhaler use and did not make any changes today. Did check oxygen saturation at rest and while ambulating which did not get under 91%.  Patient declined blood work to check kidney function. He also has no desire to follow with nephrology. Patient will bring in living will at follow up. Strict return precautions provided.     [place]

## 2021-07-08 ENCOUNTER — Other Ambulatory Visit: Payer: Self-pay | Admitting: Family Medicine

## 2021-07-08 LAB — CULTURE, BLOOD (ROUTINE X 2)
Culture: NO GROWTH
Culture: NO GROWTH

## 2021-07-09 ENCOUNTER — Telehealth: Payer: Self-pay

## 2021-07-09 DIAGNOSIS — J449 Chronic obstructive pulmonary disease, unspecified: Secondary | ICD-10-CM | POA: Diagnosis not present

## 2021-07-09 DIAGNOSIS — Z7951 Long term (current) use of inhaled steroids: Secondary | ICD-10-CM | POA: Diagnosis not present

## 2021-07-09 DIAGNOSIS — Z89421 Acquired absence of other right toe(s): Secondary | ICD-10-CM | POA: Diagnosis not present

## 2021-07-09 DIAGNOSIS — Z89411 Acquired absence of right great toe: Secondary | ICD-10-CM | POA: Diagnosis not present

## 2021-07-09 NOTE — Telephone Encounter (Signed)
Angela Nevin NP with [facility] calls nurse line reporting elevated HR this afternoon. ? ?Angela Nevin reports consistent HR of 115 during visit. Angela Nevin reported the patient stated he just didn't feel well today.  ? ?Angela Nevin denies any chest pains, dizziness, vision changes or SOB. Patient has been compliant with medications.  ? ?ED precautions reviewed with patient.  ? ?I called to check on him and possibly schedule an apt next week, however received VM.  ? ?Left ED precautions on VM and noted an all call provider is available during the weekend for questions.  ?

## 2021-07-12 NOTE — Assessment & Plan Note (Addendum)
Patient presents for follow up after his hospitalization from 4/27-4/29 where he was admitted for acute hypoxic respiratory failure secondary to CAP +/- COPD exacerbation requiring 1 day in ICU. Today he reports feeling improved but still with dyspnea especially with exertion. Completed prednisone course and his doxycycline. Takes spiriva once daily, Wixela BID, and albuterol about 2-3 times a day. Reviewed inhaler use and did not make any changes today. Did check oxygen saturation at rest and while ambulating which did not get under 91%.  Patient declined blood work to check kidney function. He also has no desire to follow with nephrology. Patient will bring in living will at follow up. Strict return precautions provided.  ?

## 2021-07-19 ENCOUNTER — Other Ambulatory Visit: Payer: Self-pay | Admitting: Family Medicine

## 2021-08-10 ENCOUNTER — Ambulatory Visit: Payer: PPO | Admitting: Podiatry

## 2021-08-13 ENCOUNTER — Other Ambulatory Visit: Payer: Self-pay | Admitting: Family Medicine

## 2021-08-13 ENCOUNTER — Ambulatory Visit: Payer: PPO | Admitting: Podiatry

## 2021-08-13 DIAGNOSIS — M2042 Other hammer toe(s) (acquired), left foot: Secondary | ICD-10-CM

## 2021-08-13 DIAGNOSIS — Z89411 Acquired absence of right great toe: Secondary | ICD-10-CM

## 2021-08-13 DIAGNOSIS — M2041 Other hammer toe(s) (acquired), right foot: Secondary | ICD-10-CM | POA: Diagnosis not present

## 2021-08-13 DIAGNOSIS — B351 Tinea unguium: Secondary | ICD-10-CM | POA: Diagnosis not present

## 2021-08-13 DIAGNOSIS — E1142 Type 2 diabetes mellitus with diabetic polyneuropathy: Secondary | ICD-10-CM

## 2021-08-13 DIAGNOSIS — E119 Type 2 diabetes mellitus without complications: Secondary | ICD-10-CM | POA: Diagnosis not present

## 2021-08-13 DIAGNOSIS — E1151 Type 2 diabetes mellitus with diabetic peripheral angiopathy without gangrene: Secondary | ICD-10-CM

## 2021-08-13 DIAGNOSIS — E1169 Type 2 diabetes mellitus with other specified complication: Secondary | ICD-10-CM

## 2021-08-13 DIAGNOSIS — M2012 Hallux valgus (acquired), left foot: Secondary | ICD-10-CM | POA: Diagnosis not present

## 2021-08-13 NOTE — Patient Instructions (Signed)
It was my pleasure serving you today! You had your annual diabetic foot examination performed today. You will notice a charge for an office visit on today's billing and this is for your annual diabetic foot exam. It is done once yearly.

## 2021-08-17 NOTE — Progress Notes (Unsigned)
ANNUAL DIABETIC FOOT EXAM  Subjective: [name] presents today {jgcomplaint:23593}.  Patient relates {Numbers; 0-100:15068} year h/o diabetes.  Patient denies any h/o foot wounds.  Patient has h/o foot ulcer of {jgPodToeLocator:23637}, which healed via help of ***.  Patient admits symptoms of foot numbness.   Patient admits symptoms of foot tingling.  Patient admits symptoms of burning in feet.  Patient admits symptoms of pins/needles sensation in feet.  Patient denies any numbness, tingling, burning, or pins/needle sensation in feet.  Patient has been diagnosed with neuropathy and it is managed with {JGNEUROPATHYMEDS:27053}.  Patient's blood sugar was *** mg/dl {Time; today/yesterday/ 2 days ago:19188}. Last known  HgA1c was ***%   Patient did not check blood glucose this morning.  Patient does not monitor blood glucose daily.  Risk factors: {jgriskfactors:24044}.  [name] Key, DO is patient's PCP. Last visit was {Time; dates multiple:15870}***.  Past Medical History:  Diagnosis Date   ANEMIA, IRON DEF, NOS 01/15/2007   Qualifier: Diagnosis of  By: Jeannine Kitten MD, Matthew     Arthritis    Cataract extraction status of left eye 2016   Cataract extraction status of right eye 2016   CHOLECYSTECTOMY, LAPAROSCOPIC, HX OF 04/12/2007   Qualifier: Diagnosis of  By: Netty Starring  MD, Lucianne Muss     Critical lower limb ischemia ([place]) 10/02/2018   Diabetes mellitus without complication ([place])    Gout 2007   Hypertension    Toe ulcer ([place]) 09/20/2018   Patient Active Problem List   Diagnosis Date Noted   Acute respiratory failure with hypoxia ([place]) 07/01/2021   CAP (community acquired pneumonia) 07/01/2021   History of amputation of right great toe ([place]) 02/14/2020   History of amputation of lesser toe, right ([place]) 02/14/2020   Partial nontraumatic amputation of right foot ([place]) 07/09/2019   Grief 03/15/2019   Peripheral artery disease ([place]) 03/15/2019   Acute  osteomyelitis of toe, right ([place]) 01/15/2019   Hearing loss 11/28/2018   Osteoarthrosis, unspecified whether generalized or localized, other specified sites 11/28/2018   Other ill-defined and unknown causes of morbidity and mortality 11/28/2018   Pain in soft tissues of limb 11/28/2018   Hypomagnesemia 04/27/2018   Hyperkalemia 04/27/2018   Acute kidney injury (nontraumatic) (HCC)    CKD (chronic kidney disease) stage 3, GFR 30-59 ml/min (HCC) 01/04/2018   Seasonal allergies 06/30/2017   Osteoarthritis of both knees 11/16/2015   COPD exacerbation ([place]) 01/26/2015   Left hip pain 12/12/2014   Hoarseness 08/13/2014   Dry skin dermatitis 04/16/2014   History of anemia 04/07/2014   Type 2 diabetes mellitus with peripheral neuropathy ([place]) 03/21/2014   Fatigue 02/17/2014   Sacral nerve root compression 06/07/2011   Herniated nucleus pulposus, L4-5 12/24/2010   Essential hypertension 05/07/2010   Chronic obstructive pulmonary disease ([place]) 03/20/2007   Gout 01/15/2007   Asthma 01/15/2007   Past Surgical History:  Procedure Laterality Date   ABDOMINAL AORTOGRAM W/LOWER EXTREMITY N/A 10/03/2018   Procedure: ABDOMINAL AORTOGRAM W/LOWER EXTREMITY;  Surgeon: Marty Heck, MD;  Location: [facility];  Service: Cardiovascular;  Laterality: N/A;  Bilateral    CHOLECYSTECTOMY     Current Outpatient Medications on File Prior to Visit  Medication Sig Dispense Refill   albuterol (PROVENTIL) (2.5 MG/3ML) 0.083% nebulizer solution Take 3 mLs (2.5 mg total) by nebulization every 6 (six) hours as needed for wheezing or shortness of breath. 150 mL 1   allopurinol (ZYLOPRIM) 300 MG tablet TAKE 1 TABLET BY MOUTH EVERY DAY (Patient taking differently:  Take 300 mg by mouth daily.) 90 tablet 2   aspirin 81 MG EC tablet Take by mouth.     atorvastatin (LIPITOR) 40 MG tablet Take 1 tablet (40 mg total) by mouth daily. 90 tablet 3   diclofenac Sodium (VOLTAREN) 1 % GEL Apply 2 g topically 4  (four) times daily. 2 g 0   empagliflozin (JARDIANCE) 25 MG TABS tablet Take 1 tablet (25 mg total) by mouth daily. 90 tablet 3   gabapentin (NEURONTIN) 300 MG capsule Take 1 capsule (300 mg total) by mouth 4 (four) times daily. (Patient taking differently: Take 300-600 mg by mouth See admin instructions. 300 mg in the morning, 600 mg at bedtime) 120 capsule 3   levocetirizine (XYZAL) 5 MG tablet TAKE 1 TABLET BY MOUTH TWICE A DAY (Patient taking differently: Take 5 mg by mouth 2 (two) times daily.) 180 tablet 3   metFORMIN (GLUCOPHAGE-XR) 500 MG 24 hr tablet Take 2 tablets (1,000 mg total) by mouth daily with breakfast. (Patient not taking: Reported on 07/02/2021) 180 tablet 3   Multiple Vitamins-Minerals (MULTIVITAMIN WITH MINERALS) tablet Take 1 tablet by mouth daily.     predniSONE (DELTASONE) 20 MG tablet Take 1 tablet (20 mg total) by mouth daily with breakfast. (Patient not taking: Reported on 07/07/2021) 3 tablet 0   SPIRIVA HANDIHALER 18 MCG inhalation capsule INHALE 1 CAPSULE VIA HANDIHALER ONCE DAILY AT THE SAME TIME EVERY DAY (Patient taking differently: Place 18 mcg into inhaler and inhale daily.) 30 capsule 2   triamcinolone cream (KENALOG) 0.1 % APPLY TO AFFECTED AREA TWICE A DAY (Patient taking differently: 1 application. 2 (two) times daily.) 30 g 0   WIXELA INHUB 250-50 MCG/ACT AEPB INHALE 1 PUFF INTO THE LUNGS IN THE MORNING AND AT BEDTIME. 60 each 0   [DISCONTINUED] atorvastatin (LIPITOR) 20 MG tablet TAKE 1 TABLET BY MOUTH EVERY DAY (Patient not taking: Reported on 07/07/2021) 90 tablet 1   No current facility-administered medications on file prior to visit.    No Known Allergies Social History   Occupational History   Occupation: RetiredAnimal nutritionist Work  Tobacco Use   Smoking status: Former    Packs/day: 0.50    Years: 60.00    Total pack years: 30.00    Types: Cigars, Cigarettes    Start date: 03/08/1949    Quit date: 06/08/2011    Years since quitting: 10.2    Passive  exposure: Past   Smokeless tobacco: Never  Vaping Use   Vaping Use: Never used  Substance and Sexual Activity   Alcohol use: No    Comment: Hx of use   Drug use: No   Sexual activity: Not Currently   Family History  Problem Relation Age of Onset   Asthma Mother    Arthritis Sister    Alcohol abuse Sister    Cirrhosis Sister    Asthma Sister    Heart disease Brother    Stroke Brother    Diabetes Brother    Cancer Brother        colon   Stroke Brother    Heart disease Brother    Arthritis Daughter    Asthma Daughter    Diabetes Son    Immunization History  Administered Date(s) Administered   Fluad Quad(high Dose 65+) 03/11/2019, 12/20/2019, 12/09/2020   Influenza Split 12/29/2010, 12/06/2011   Influenza Whole 01/16/2009   Influenza,inj,Quad PF,6+ Mos 12/24/2012, 12/16/2013, 12/12/2014, 12/07/2015, 12/29/2016, 11/10/2017   Influenza-Unspecified 12/28/2000, 01/06/2003, 01/05/2004, 01/12/2005, 01/05/2006   PFIZER Comirnaty([place]  Top)Covid-19 Tri-Sucrose Vaccine 06/17/2020   PFIZER(Purple Top)SARS-COV-2 Vaccination 03/30/2019, 04/20/2019, 12/20/2019   Pfizer Covid-19 Vaccine Bivalent Booster 77yrs & up 12/09/2020   Pneumococcal Conjugate-13 10/24/2007   Pneumococcal Polysaccharide-23 09/29/2000, 07/27/2006, 07/11/2011   Tdap 07/27/2006, 07/23/2008     Review of Systems: Negative except as noted in the HPI.   Objective: There were no vitals filed for this visit.  Alcus Bair is a pleasant 87 y.o. male in NAD. AAO X 3.  Vascular Examination: {jgvascular:23595}  Dermatological Examination: {jgderm:23598}  Neurological Examination: {jgneuro:23601::"Protective sensation intact 5/5 intact bilaterally with 10g monofilament b/l.","Vibratory sensation intact b/l.","Proprioception intact bilaterally."}  Musculoskeletal Examination: {jgmsk:23600}  Footwear Assessment: Does the patient wear appropriate shoes? {Yes,No}. Does the patient need inserts/orthotics?  {Yes,No}.     Latest Ref Rng & Units 07/01/2021    8:01 PM 04/09/2021   11:00 AM 01/07/2021    3:15 PM 09/01/2020    2:38 PM  Hemoglobin A1C  Hemoglobin-A1c 4.8 - 5.6 % 7.7  8.6  9.9  8.5     Assessment: No diagnosis found.   ADA Risk Categorization: Low Risk :  Patient has all of the following: Intact protective sensation No prior foot ulcer  No severe deformity Pedal pulses present  High Risk  Patient has one or more of the following: Loss of protective sensation Absent pedal pulses Severe Foot deformity History of foot ulcer Plan: {jgplan:23602::"-Patient/POA to call should there be question/concern in the interim."} Return in about 6 months (around 02/12/2022).  Marzetta Board, DPM

## 2021-08-18 ENCOUNTER — Encounter: Payer: Self-pay | Admitting: Podiatry

## 2021-09-03 DIAGNOSIS — M25512 Pain in left shoulder: Secondary | ICD-10-CM | POA: Diagnosis not present

## 2021-09-03 DIAGNOSIS — G8929 Other chronic pain: Secondary | ICD-10-CM | POA: Diagnosis not present

## 2021-09-03 DIAGNOSIS — Z7951 Long term (current) use of inhaled steroids: Secondary | ICD-10-CM | POA: Diagnosis not present

## 2021-09-03 DIAGNOSIS — Z89421 Acquired absence of other right toe(s): Secondary | ICD-10-CM | POA: Diagnosis not present

## 2021-09-03 DIAGNOSIS — R413 Other amnesia: Secondary | ICD-10-CM | POA: Diagnosis not present

## 2021-09-03 DIAGNOSIS — Z89411 Acquired absence of right great toe: Secondary | ICD-10-CM | POA: Diagnosis not present

## 2021-09-03 DIAGNOSIS — J449 Chronic obstructive pulmonary disease, unspecified: Secondary | ICD-10-CM | POA: Diagnosis not present

## 2021-09-12 ENCOUNTER — Other Ambulatory Visit: Payer: Self-pay | Admitting: Family Medicine

## 2021-09-12 DIAGNOSIS — M170 Bilateral primary osteoarthritis of knee: Secondary | ICD-10-CM

## 2021-09-13 ENCOUNTER — Other Ambulatory Visit: Payer: Self-pay | Admitting: Family Medicine

## 2021-09-21 ENCOUNTER — Other Ambulatory Visit: Payer: Self-pay | Admitting: Family Medicine

## 2021-10-11 ENCOUNTER — Other Ambulatory Visit: Payer: Self-pay | Admitting: Family Medicine

## 2021-10-11 MED ORDER — GABAPENTIN 300 MG PO CAPS: 300.0000 mg | ORAL_CAPSULE | Freq: Four times a day (QID) | ORAL | 0 refills | Status: DC

## 2021-10-11 NOTE — Telephone Encounter (Signed)
Patient came in stating that he needs a refill of his Gabapentin, he is completely out of it.

## 2021-10-19 ENCOUNTER — Other Ambulatory Visit: Payer: Self-pay | Admitting: Family Medicine

## 2021-10-19 ENCOUNTER — Encounter: Payer: Self-pay | Admitting: Family Medicine

## 2021-10-19 ENCOUNTER — Ambulatory Visit (INDEPENDENT_AMBULATORY_CARE_PROVIDER_SITE_OTHER): Payer: PPO | Admitting: Family Medicine

## 2021-10-19 VITALS — BP 135/80 | HR 92 | Wt 179.2 lb

## 2021-10-19 DIAGNOSIS — N1832 Chronic kidney disease, stage 3b: Secondary | ICD-10-CM | POA: Diagnosis not present

## 2021-10-19 DIAGNOSIS — J449 Chronic obstructive pulmonary disease, unspecified: Secondary | ICD-10-CM | POA: Diagnosis not present

## 2021-10-19 DIAGNOSIS — L309 Dermatitis, unspecified: Secondary | ICD-10-CM

## 2021-10-19 DIAGNOSIS — E1142 Type 2 diabetes mellitus with diabetic polyneuropathy: Secondary | ICD-10-CM | POA: Diagnosis not present

## 2021-10-19 DIAGNOSIS — Z89421 Acquired absence of other right toe(s): Secondary | ICD-10-CM | POA: Diagnosis not present

## 2021-10-19 DIAGNOSIS — Z7984 Long term (current) use of oral hypoglycemic drugs: Secondary | ICD-10-CM | POA: Diagnosis not present

## 2021-10-19 DIAGNOSIS — Z89411 Acquired absence of right great toe: Secondary | ICD-10-CM | POA: Diagnosis not present

## 2021-10-19 DIAGNOSIS — E119 Type 2 diabetes mellitus without complications: Secondary | ICD-10-CM | POA: Diagnosis not present

## 2021-10-19 LAB — POCT GLYCOSYLATED HEMOGLOBIN (HGB A1C): HbA1c, POC (controlled diabetic range): 7.9 % — AB (ref 0.0–7.0)

## 2021-10-19 MED ORDER — TRIAMCINOLONE ACETONIDE 0.1 % EX CREA: TOPICAL_CREAM | CUTANEOUS | 0 refills | Status: DC | PRN

## 2021-10-19 MED ORDER — FLUTICASONE-SALMETEROL 250-50 MCG/ACT IN AEPB: 1.0000 | INHALATION_SPRAY | Freq: Two times a day (BID) | RESPIRATORY_TRACT | 2 refills | Status: DC

## 2021-10-19 NOTE — Assessment & Plan Note (Addendum)
-  Stable on current regimen. Refill Wixela. Reviewed inhaler use. -Patient with chronic, intermittent nocturnal cough productive of white sputum. Advised patient to avoid using phenylephrine due to episode of disorientation after use. Declines interest in seeing a pulmonologist. Patient would like to manage on current regimen and decline interest in exploring alternate etiologies.

## 2021-10-19 NOTE — Progress Notes (Signed)
    SUBJECTIVE:   CHIEF COMPLAINT / HPI:  88 yo male with PMHx T2DM with peripheral neuropathy and COPD presents for: Type 2 Diabetes: Last two A1C's below. Home medications include: Jardiance. Does endorse compliance. Home glucose monitoring is not performed.   Patient with persistent peripheral neuropathy despite taking gabapentin. States he regularly sees podiatry for management of his diabetic foot care.  Most recent A1Cs:  Lab Results  Component Value Date   HGBA1C 7.9 (A) 10/19/2021   HGBA1C 7.7 (H) 07/01/2021   Last Microalbumin, LDL, Creatinine: Lab Results  Component Value Date   LDLCALC 59 01/08/2021   CREATININE 1.94 (H) 07/03/2021  Patient is up to date on diabetic eye. Patient is up to date on diabetic foot exam.  2. Patient states he has a chronic, intermittent nocturnal cough productive of with sputum x 3-4 years. States he took an OTC cough medication recent but he woke up feeling disoriented and "off" the next morning and was worried it interacted with his medications. Patient with extensive smoking hx but quit 10 years ago. Hx severe COPD with hospitalization in 06/2021 for acute respiratory failure.   PERTINENT  PMH / PSH: T2DM with peripheral neuropathy, COPD  OBJECTIVE:   BP 135/80   Pulse 92   Wt 179 lb 3.2 oz (81.3 kg)   SpO2 92%   BMI 24.64 kg/m    General: NAD, pleasant, able to participate in exam Cardiac: RRR, no murmurs. Respiratory: Moderately decreased air movement in bilateral lung bases. Normal effort, No wheezes, rales or rhonchi Extremities: no edema or cyanosis. Skin: warm and dry, no rashes noted Neuro: alert, no obvious focal deficits Psych: Normal affect and mood  ASSESSMENT/PLAN:   Type 2 diabetes mellitus with peripheral neuropathy (HCC) -Repeat A1c 7.9, slight increase from 7.7 in 08/2021. Advised patient to continue current medication regimen and follow up in 3 months. Ordered A1c to be taken at next visit. -Patient with some  peripheral neuropathy. Advised him we are at the maximum dose of gabapentin given his kidney function. Patient declined referral to neurology for further management.  Chronic obstructive pulmonary disease (HCC) -Stable on current regimen. Refill Wixela. Reviewed inhaler use. -Patient with chronic, intermittent nocturnal cough productive of white sputum. Advised patient to avoid using phenylephrine due to episode of disorientation after use. Declines interest in seeing a pulmonologist. Patient would like to manage on current regimen and decline interest in exploring alternate etiologies.  CKD (chronic kidney disease) stage 3, GFR 30-59 ml/min (HCC) Most recent GFR 33. Ordered BMP and discussed monitoring kidney function to ensure medications are appropriately dosed.     Dr. Colletta [place], [place]

## 2021-10-19 NOTE — Assessment & Plan Note (Addendum)
Most recent GFR 33. Ordered BMP and discussed monitoring kidney function to ensure medications are appropriately dosed.

## 2021-10-19 NOTE — Patient Instructions (Addendum)
It was wonderful to see you today.  Please bring ALL of your medications with you to every visit.   Today we talked about:  Your A1c is 7.9 today. Please continue to take your Jardiance as prescribed and follow up in 3 months. Continue to follow up with podiatry as scheduled. I refilled your inhaler prescription today. If you would like to discuss going to the lung doctor for your chronic cough please contact the clinic. We will have the flu and COVID vaccine in the next month. You can return to have it here or visit your local pharmacy to have it done.  Please follow up in 3 months.  Thank you for choosing [facility].   Please call [phone] with any questions about today's appointment.  Please be sure to schedule follow up at the front  desk before you leave today.   Colletta Maryland, DO Family Medicine

## 2021-10-19 NOTE — Assessment & Plan Note (Addendum)
-  Repeat A1c 7.9, slight increase from 7.7 in 08/2021. Advised patient to continue current medication regimen and follow up in 3 months. Ordered A1c to be taken at next visit. -Patient with some peripheral neuropathy. Advised him we are at the maximum dose of gabapentin given his kidney function. Patient declined referral to neurology for further management.

## 2021-10-20 LAB — BASIC METABOLIC PANEL WITH GFR
BUN/Creatinine Ratio: 15 (ref 10–24)
BUN: 33 mg/dL — ABNORMAL HIGH (ref 8–27)
CO2: 21 mmol/L (ref 20–29)
Calcium: 10.2 mg/dL (ref 8.6–10.2)
Chloride: 101 mmol/L (ref 96–106)
Creatinine, Ser: 2.21 mg/dL — ABNORMAL HIGH (ref 0.76–1.27)
Glucose: 151 mg/dL — ABNORMAL HIGH (ref 70–99)
Potassium: 4.6 mmol/L (ref 3.5–5.2)
Sodium: 141 mmol/L (ref 134–144)
eGFR: 28 mL/min/1.73 — ABNORMAL LOW (ref >59)

## 2021-10-22 ENCOUNTER — Telehealth: Payer: Self-pay | Admitting: Family Medicine

## 2021-10-22 DIAGNOSIS — E1142 Type 2 diabetes mellitus with diabetic polyneuropathy: Secondary | ICD-10-CM

## 2021-10-22 DIAGNOSIS — M1A9XX0 Chronic gout, unspecified, without tophus (tophi): Secondary | ICD-10-CM

## 2021-10-22 DIAGNOSIS — N184 Chronic kidney disease, stage 4 (severe): Secondary | ICD-10-CM

## 2021-10-22 MED ORDER — ALLOPURINOL 100 MG PO TABS: 100.0000 mg | ORAL_TABLET | Freq: Every day | ORAL | 3 refills | Status: DC

## 2021-10-22 MED ORDER — GABAPENTIN 300 MG PO CAPS: 300.0000 mg | ORAL_CAPSULE | Freq: Three times a day (TID) | ORAL | 3 refills | Status: DC

## 2021-10-22 NOTE — Telephone Encounter (Addendum)
Spoke with patient regarding decline in kidney function to CKD Stage 4 on most recent lab work. Per pharmacy recommendations, patient can remain on Jardiance 25mg  as no known toxicities noted. Recommended reducing Allopurinol to 100mg  and patient agreeable but noted he just picked up 90 day supply of 300mg  tablets and did not want to waste the medication. Tablets can be divided in half and recommended patient cut them in half until he picks up the new prescription and he agrees.  Adjusted patient Gabapentin prescription to 300mg  TID to reflect what he is already taking.  Recommend return for follow up labs to assess kidney function and uric acid level and patient agreeable to 6 week follow up.  Answered all patient questions.  Colletta Maryland, DO

## 2021-10-25 NOTE — Telephone Encounter (Signed)
Patient scheduled for 9/25 for lab only visit.

## 2021-11-16 ENCOUNTER — Other Ambulatory Visit: Payer: Self-pay | Admitting: Family Medicine

## 2021-11-16 DIAGNOSIS — E1142 Type 2 diabetes mellitus with diabetic polyneuropathy: Secondary | ICD-10-CM

## 2021-11-17 ENCOUNTER — Encounter: Payer: Self-pay | Admitting: Podiatry

## 2021-11-17 ENCOUNTER — Ambulatory Visit: Payer: PPO | Admitting: Podiatry

## 2021-11-17 DIAGNOSIS — B351 Tinea unguium: Secondary | ICD-10-CM | POA: Diagnosis not present

## 2021-11-17 DIAGNOSIS — E1169 Type 2 diabetes mellitus with other specified complication: Secondary | ICD-10-CM

## 2021-11-17 DIAGNOSIS — E1151 Type 2 diabetes mellitus with diabetic peripheral angiopathy without gangrene: Secondary | ICD-10-CM

## 2021-11-17 DIAGNOSIS — E1142 Type 2 diabetes mellitus with diabetic polyneuropathy: Secondary | ICD-10-CM

## 2021-11-17 DIAGNOSIS — Z89411 Acquired absence of right great toe: Secondary | ICD-10-CM

## 2021-11-17 NOTE — Progress Notes (Signed)
This patient returns to my office for at risk foot care.  This patient requires this care by a professional since this patient will be at risk due to having diabetes and amputation 1,2 right toes.  This patient is unable to cut nails himself since the patient cannot reach his nails.These nails are painful walking and wearing shoes.  This patient presents for at risk foot care today.  General Appearance  Alert, conversant and in no acute stress.  Vascular  Dorsalis pedis and posterior tibial  pulses are  weakly palpable  bilaterally.  Capillary return is within normal limits  bilaterally. Temperature is within normal limits  bilaterally.  Neurologic  Senn-Weinstein monofilament wire test within normal limits  bilaterally. Muscle power within normal limits bilaterally.  Nails Thick disfigured discolored nails with subungual debris  from hallux to fifth toes left and 3-5 right foot. No evidence of bacterial infection or drainage bilaterally.  Orthopedic  No limitations of motion  feet .  No crepitus or effusions noted.  No bony pathology or digital deformities noted.  Amputation 1,2 right toes.  Skin  normotropic skin with no porokeratosis noted bilaterally.  No signs of infections or ulcers noted.     Onychomycosis  Pain in right toes  Pain in left toes  Consent was obtained for treatment procedures.   Mechanical debridement of nails 1-5  bilaterally performed with a nail nipper.  Filed with dremel without incident.    Return office visit    3 months                  Told patient to return for periodic foot care and evaluation due to potential at risk complications.   Gardiner Barefoot DPM

## 2021-11-29 ENCOUNTER — Ambulatory Visit (INDEPENDENT_AMBULATORY_CARE_PROVIDER_SITE_OTHER): Payer: PPO | Admitting: *Deleted

## 2021-11-29 ENCOUNTER — Other Ambulatory Visit (INDEPENDENT_AMBULATORY_CARE_PROVIDER_SITE_OTHER): Payer: PPO

## 2021-11-29 DIAGNOSIS — N184 Chronic kidney disease, stage 4 (severe): Secondary | ICD-10-CM | POA: Diagnosis not present

## 2021-11-29 DIAGNOSIS — M1A9XX0 Chronic gout, unspecified, without tophus (tophi): Secondary | ICD-10-CM | POA: Diagnosis not present

## 2021-11-29 DIAGNOSIS — Z23 Encounter for immunization: Secondary | ICD-10-CM | POA: Diagnosis not present

## 2021-11-29 DIAGNOSIS — E1142 Type 2 diabetes mellitus with diabetic polyneuropathy: Secondary | ICD-10-CM

## 2021-11-29 LAB — POCT GLYCOSYLATED HEMOGLOBIN (HGB A1C): Hemoglobin A1C: 8.6 % — AB (ref 4.0–5.6)

## 2021-11-30 LAB — BASIC METABOLIC PANEL
BUN/Creatinine Ratio: 16 (ref 10–24)
BUN: 33 mg/dL — ABNORMAL HIGH (ref 8–27)
CO2: 20 mmol/L (ref 20–29)
Calcium: 10 mg/dL (ref 8.6–10.2)
Chloride: 100 mmol/L (ref 96–106)
Creatinine, Ser: 2.03 mg/dL — ABNORMAL HIGH (ref 0.76–1.27)
Glucose: 247 mg/dL — ABNORMAL HIGH (ref 70–99)
Potassium: 5.1 mmol/L (ref 3.5–5.2)
Sodium: 140 mmol/L (ref 134–144)
eGFR: 31 mL/min/{1.73_m2} — ABNORMAL LOW (ref >59)

## 2021-11-30 LAB — URIC ACID: Uric Acid: 5.1 mg/dL (ref 3.8–8.4)

## 2021-12-02 ENCOUNTER — Telehealth: Payer: Self-pay | Admitting: Family Medicine

## 2021-12-02 NOTE — Telephone Encounter (Signed)
Entered in error

## 2021-12-02 NOTE — Telephone Encounter (Signed)
Spoke with patient about lab results. A1c increased to 8.6 from 7.9 previously. Kidney function stable with previous labs. Patient would like to recheck labs in 3 months and does not want to pursue medication changes at this time. Patient driving and unable to make an appointment during the call so advised him to call back to make an appointment for 3 months. Repeat labs (POC A1c and BMP) to be done at that appointment.  Answered all questions.  Colletta Maryland, DO

## 2021-12-12 ENCOUNTER — Other Ambulatory Visit: Payer: Self-pay | Admitting: Family Medicine

## 2021-12-13 ENCOUNTER — Other Ambulatory Visit: Payer: Self-pay | Admitting: Family Medicine

## 2021-12-17 ENCOUNTER — Ambulatory Visit: Payer: Self-pay

## 2021-12-17 ENCOUNTER — Telehealth: Payer: Self-pay

## 2021-12-17 NOTE — Patient Outreach (Signed)
error 

## 2021-12-17 NOTE — Patient Outreach (Signed)
  Care Coordination   12/17/2021 Name: [name] MRN: [id number] DOB: 12/30/1933   Care Coordination Outreach Attempts:  An unsuccessful telephone outreach was attempted today to offer the patient information about available care coordination services as a benefit of their health plan.   Follow Up Plan:  Additional outreach attempts will be made to offer the patient care coordination information and services.   Encounter Outcome:  No Answer  Care Coordination Interventions Activated:  No   Care Coordination Interventions:  No, not indicated    Johnney Killian, RN, BSN, CCM Care Management Coordinator [facility] Phone: [phone]: [phone]

## 2021-12-28 ENCOUNTER — Other Ambulatory Visit: Payer: Self-pay | Admitting: Family Medicine

## 2021-12-28 DIAGNOSIS — E1142 Type 2 diabetes mellitus with diabetic polyneuropathy: Secondary | ICD-10-CM

## 2022-01-01 ENCOUNTER — Other Ambulatory Visit: Payer: Self-pay | Admitting: Family Medicine

## 2022-01-03 ENCOUNTER — Telehealth: Payer: Self-pay

## 2022-01-03 ENCOUNTER — Ambulatory Visit: Payer: PPO

## 2022-01-03 NOTE — Patient Outreach (Signed)
  Care Coordination   01/03/2022 Name: [name] MRN: [id number] DOB: [birth year]   Care Coordination Outreach Attempts:  A second unsuccessful outreach was attempted today to offer the patient with information about available care coordination services as a benefit of their health plan.     Follow Up Plan:  Additional outreach attempts will be made to offer the patient care coordination information and services.   Encounter Outcome:  No Answer  Care Coordination Interventions Activated:  No   Care Coordination Interventions:  No, not indicated    Johnney Killian, RN, BSN, CCM Care Management Coordinator [facility] Phone: [phone]: [phone]

## 2022-01-06 ENCOUNTER — Other Ambulatory Visit: Payer: Self-pay | Admitting: Family Medicine

## 2022-01-06 DIAGNOSIS — M1A9XX0 Chronic gout, unspecified, without tophus (tophi): Secondary | ICD-10-CM

## 2022-01-06 MED ORDER — ALLOPURINOL 100 MG PO TABS: 100.0000 mg | ORAL_TABLET | Freq: Every day | ORAL | 3 refills | Status: DC

## 2022-01-06 NOTE — Progress Notes (Signed)
Refused Allopurinol 300mg  refill and sent in 100mg  due to decline in kidney function per Dr. Jerilee Hoh last clinic note

## 2022-01-12 ENCOUNTER — Ambulatory Visit (HOSPITAL_COMMUNITY)
Admission: RE | Admit: 2022-01-12 | Discharge: 2022-01-12 | Disposition: A | Discharge status: Home / Self Care | Payer: PPO | Source: Ambulatory Visit | Attending: Family Medicine | Admitting: Family Medicine

## 2022-01-12 ENCOUNTER — Ambulatory Visit (INDEPENDENT_AMBULATORY_CARE_PROVIDER_SITE_OTHER): Payer: PPO | Admitting: Family Medicine

## 2022-01-12 VITALS — BP 131/86 | HR 103 | Ht 71.5 in | Wt 180.2 lb

## 2022-01-12 DIAGNOSIS — S99921A Unspecified injury of right foot, initial encounter: Secondary | ICD-10-CM | POA: Diagnosis not present

## 2022-01-12 DIAGNOSIS — S91109A Unspecified open wound of unspecified toe(s) without damage to nail, initial encounter: Secondary | ICD-10-CM | POA: Diagnosis not present

## 2022-01-12 DIAGNOSIS — M7989 Other specified soft tissue disorders: Secondary | ICD-10-CM | POA: Diagnosis not present

## 2022-01-12 DIAGNOSIS — Z23 Encounter for immunization: Secondary | ICD-10-CM

## 2022-01-12 MED ORDER — CEPHALEXIN 500 MG PO CAPS: 500.0000 mg | ORAL_CAPSULE | Freq: Four times a day (QID) | ORAL | 0 refills | Status: AC

## 2022-01-12 NOTE — Addendum Note (Signed)
Addended by: Lavell Anchors A on: 01/12/2022 03:30 PM   Modules accepted: Orders

## 2022-01-12 NOTE — Progress Notes (Addendum)
   Note corrected from 2nd digit to 3rd digit SUBJECTIVE:   CHIEF COMPLAINT / HPI:   HPI: Patient c/o right 3rd toe open wound f/u an injury he sustained at home about 1 week ago. He hit his toe against the edge of a cabinet while trying to get to the bathroom. Since then, he has dressed this wound, which is not getting better. At baseline, he has bone exposure of his second digit from surgery he had 2 years ago, but this is now more open. He denies pain.   PERTINENT  PMH / PSH: PMHx reviewed  OBJECTIVE:   BP 131/86   Pulse (!) 103   Ht 5' 11.5" (1.816 m)   Wt 180 lb 3.2 oz (81.7 kg)   SpO2 96%   BMI 24.78 kg/m   Physical Exam Vitals and nursing note reviewed.  Cardiovascular:     Rate and Rhythm: Normal rate and regular rhythm.     Heart sounds: Normal heart sounds. No murmur heard. Pulmonary:     Effort: Pulmonary effort is normal. No respiratory distress.     Breath sounds: Normal breath sounds. No wheezing.  Feet:     Comments: Foul smelling open wound of his right 3rd digit with bone exposure     ASSESSMENT/PLAN:   Open wound of right 3rd digit with bone exposure Hx of PAD and DM2 (diabetic foot) s/p amputation of his right great toe. Hx of a poor healing wound. I discussed ED evaluation for potential IV A/B, but they prefer outpatient evaluation for now. Xray ordered. Oral Keflex escribed. An urgent referral to podiatry was ordered. As discussed with him, I might call him for hospital admission tomorrow, depending on his x-ray report. He agreed with the plan.  Tdap given.   Andrena Mews, MD [facility]

## 2022-01-12 NOTE — Patient Instructions (Signed)

## 2022-01-13 ENCOUNTER — Ambulatory Visit: Payer: PPO | Admitting: Podiatry

## 2022-01-13 ENCOUNTER — Other Ambulatory Visit: Payer: Self-pay | Admitting: Family Medicine

## 2022-01-13 DIAGNOSIS — L97514 Non-pressure chronic ulcer of other part of right foot with necrosis of bone: Secondary | ICD-10-CM

## 2022-01-13 DIAGNOSIS — E1151 Type 2 diabetes mellitus with diabetic peripheral angiopathy without gangrene: Secondary | ICD-10-CM

## 2022-01-13 DIAGNOSIS — J449 Chronic obstructive pulmonary disease, unspecified: Secondary | ICD-10-CM

## 2022-01-13 DIAGNOSIS — I739 Peripheral vascular disease, unspecified: Secondary | ICD-10-CM | POA: Diagnosis not present

## 2022-01-13 NOTE — Progress Notes (Signed)
His xray result is still pending. I recommended that he go ahead and call his podiatrist for an urgent appointment today. I will call with xray result. He is aware that he will need hospital admission if he has bone infection. In the meantime, he will continue his oral A/B.

## 2022-01-13 NOTE — Progress Notes (Signed)
Subjective:  Patient ID: [name], male    DOB: [birth year],  MRN: [id number]  Chief Complaint  Patient presents with   Toe Injury    Injury to 3rd toe on right foot about 5 days ago.    Wound Check    Wound to 3rd toe on right foot. Patient has been applying betadine to the area.     88 y.o. male presents with concern to wound on the right 3rd toe. Has history DM 2 with neuropathy. Prior R 1st and 2nd toe amp and partial 3rd toe amp. Says he hit is right foot on edge of cabinet about 5 days ago. Saw PCP who recommended he follow up with podiatry. He also has prior vascular surgery intervention to right leg, bypass. Has not seen them recently. Denies drainage, N/V/F/C. Currently on 10 days abx from PCP.   Past Medical History:  Diagnosis Date   ANEMIA, IRON DEF, NOS 01/15/2007   Qualifier: Diagnosis of  By: Jeannine Kitten MD, Matthew     Arthritis    Cataract extraction status of left eye 2016   Cataract extraction status of right eye 2016   CHOLECYSTECTOMY, LAPAROSCOPIC, HX OF 04/12/2007   Qualifier: Diagnosis of  By: Netty Starring  MD, Lucianne Muss     Critical lower limb ischemia ([place]) 10/02/2018   Diabetes mellitus without complication ([place])    Gout 2007   Hypertension    Toe ulcer ([place]) 09/20/2018    No Known Allergies  ROS: Negative except as per HPI above  Objective:  General: AAO x3, NAD  Dermatological: R 3rd toe Open wound with proixmal phalanx head exposed distally. No active drainage, mild maceration and fibrotic/necrotic tissue present at open wound. No ascending cellulitis or malodor. Non tender to palpation.   Vascular:  Dorsalis Pedis artery and Posterior Tibial artery pedal pulses are 1/4 bilateral.  Capillary fill time decreased to remaining toes right foot.   Neruologic: Grossly diminished via light touch bilateral. Protective threshold diminished to all sites bilateral.   Musculoskeletal: Prior 1st and 2nd toe amputation. Partial amputation R 3rd toe.   Gait:  Unassisted, Nonantalgic.     Radiographs:  Date: 01/12/2022 XR the left foot Weightbearing AP/Lateral/Oblique   Findings: Attention directed to the 3rd toe no evidence of osteolysis or erosions at the distal aspect of the proximal phalanx 3rd toe. No subcutaneous emphysema noted extending into foot.  Assessment:   1. Skin ulcer of toe of right foot with necrosis of bone ([place])   2. Type 2 diabetes mellitus with diabetic peripheral angiopathy without gangrene, without long-term current use of insulin ([place])   3. PAD (peripheral artery disease) ([place])      Plan:  Patient was evaluated and treated and all questions answered.  # Right foot 3rd toe open wound with exposed proximal phalanx head -Discussed with patient based on severity of wound and exposed bone we will need to proceed with amputation of remaining 3rd toe in the near future.  - As patient had difficulty healing prior amputations right foot and has poor pedal pulses recommend vascular consult prior to amputation to see if he requires any further vascular optimization prior to surgery - No urgent need for amputation at this time, continue PO abx for 10 days. - If develops N/V/F/C, or has redness or swelling worsening in foot proceed to emergency department.  - Daily betadine wet to dry dressings to be applied to right 3rd toe wound for now.   Return in about 3 weeks (around  02/03/2022) for follow up right 2nd to ulcer .          [name], DPM [facility]

## 2022-01-14 DIAGNOSIS — Z89411 Acquired absence of right great toe: Secondary | ICD-10-CM | POA: Diagnosis not present

## 2022-01-14 DIAGNOSIS — I739 Peripheral vascular disease, unspecified: Secondary | ICD-10-CM | POA: Diagnosis not present

## 2022-01-14 DIAGNOSIS — E785 Hyperlipidemia, unspecified: Secondary | ICD-10-CM | POA: Diagnosis not present

## 2022-01-14 DIAGNOSIS — Z89421 Acquired absence of other right toe(s): Secondary | ICD-10-CM | POA: Diagnosis not present

## 2022-01-17 ENCOUNTER — Telehealth: Payer: Self-pay | Admitting: Family Medicine

## 2022-01-17 NOTE — Progress Notes (Unsigned)
[facility] [place]  ASSESSMENT / PLAN: [name] is a 88 y.o. male with atherosclerosis of native arteries of right lower extremity causing ulceration.  Patient counseled patients with chronic limb threatening ischemia have an annual risk of cardiovascular mortality of 25% and a high risk of amputation.   WIfI score: 2 / 3 / 1. Clinical stage IV. High potential benefit from revascularization.  Recommend the following which can slow the progression of atherosclerosis and reduce the risk of major adverse cardiac / limb events:  Complete cessation from all tobacco products. Blood glucose control with goal A1c < 7%. Blood pressure control with goal blood pressure < 140/90 mmHg. Lipid reduction therapy with goal LDL-C <100 mg/dL (<70 if symptomatic from PAD).  Aspirin 81mg  PO QD.  Atorvastatin 40-80mg  PO QD (or other "high intensity" statin therapy).  Plan right lower extremity angiogram with possible intervention via left common femoral artery approach in cath lab 01/21/22.   CHIEF COMPLAINT: right foot ulcer  HISTORY OF PRESENT ILLNESS: [name] is a 88 y.o. male referred to clinic for evaluation of right foot ulceration.  The patient was seen remotely in the past by partner Dr. Carlis Abbott for the same.  He underwent angiography, and ultimately elected to pursue intervention at [facility]. Dear Dr. Nyoka Cowden performed a right superficial femoral to posterior tibial artery bypass with spliced greater saphenous vein.  This remained patent for a short time allowing his first digit to heal.  He then developed second digit ulceration.  The bypass was found to be thrombosed.  He did local wound care which ultimately was able to heal the toe.  Over the past several weeks he has developed new, worsening ulceration of the second toe.  He presents today at the request of his podiatrist for vascular evaluation.  Past Medical History:  Diagnosis Date   ANEMIA, IRON  DEF, NOS 01/15/2007   Qualifier: Diagnosis of  By: Jeannine Kitten MD, Matthew     Arthritis    Cataract extraction status of left eye 2016   Cataract extraction status of right eye 2016   CHOLECYSTECTOMY, LAPAROSCOPIC, HX OF 04/12/2007   Qualifier: Diagnosis of  By: Netty Starring  MD, Lucianne Muss     Critical lower limb ischemia ([place]) 10/02/2018   Diabetes mellitus without complication ([place])    Gout 2007   Hypertension    Toe ulcer ([place]) 09/20/2018    Past Surgical History:  Procedure Laterality Date   ABDOMINAL AORTOGRAM W/LOWER EXTREMITY N/A 10/03/2018   Procedure: ABDOMINAL AORTOGRAM W/LOWER EXTREMITY;  Surgeon: Marty Heck, MD;  Location: [place] [facility];  Service: Cardiovascular;  Laterality: N/A;  Bilateral    CHOLECYSTECTOMY      Family History  Problem Relation Age of Onset   Asthma Mother    Arthritis Sister    Alcohol abuse Sister    Cirrhosis Sister    Asthma Sister    Heart disease Brother    Stroke Brother    Diabetes Brother    Cancer Brother        colon   Stroke Brother    Heart disease Brother    Arthritis Daughter    Asthma Daughter    Diabetes Son     Social History   Socioeconomic History   Marital status: Widowed    Spouse name: Not on file   Number of children: Not on file   Years of education: 10   Highest education level: 10th grade  Occupational History  Occupation: RetiredAnimal nutritionist Work  Tobacco Use   Smoking status: Former    Packs/day: 0.50    Years: 60.00    Total pack years: 30.00    Types: Cigars, Cigarettes    Start date: 03/08/1951    Quit date: 06/08/2011    Years since quitting: 10.6    Passive exposure: Past   Smokeless tobacco: Never  Vaping Use   Vaping Use: Never used  Substance and Sexual Activity   Alcohol use: No    Comment: Hx of use   Drug use: No   Sexual activity: Not Currently  Other Topics Concern   Not on file  Social History Narrative   Patient lives with his daughter and her husband.   Patients wife died in  04-Jun-2018.   Health Care POA:    Emergency Contact: Daughter- Elberta Fortis [phone]   End of Life Plan:       Any pets: none   Diet: Patient has a varied diet and reports eating bacon and eggs every morning. Does not like most fruit. Likes flavored water, kool-aid.   No current exercise.     Seatbelts: Pt reports wearing seatbelt when in vehicle.   Hobbies: likes to watch [employer] and rest                Social Determinants of Health   Financial Resource Strain: Low Risk  (05/25/2021)   Overall Financial Resource Strain (CARDIA)    Difficulty of Paying Living Expenses: Not very hard  Food Insecurity: Food Insecurity Present (05/25/2021)   Hunger Vital Sign    Worried About Running Out of Food in the Last Year: Sometimes true    Ran Out of Food in the Last Year: Sometimes true  Transportation Needs: No Transportation Needs (05/25/2021)   PRAPARE - Hydrologist (Medical): No    Lack of Transportation (Non-Medical): No  Physical Activity: Inactive (05/25/2021)   Exercise Vital Sign    Days of Exercise per Week: 0 days    Minutes of Exercise per Session: 0 min  Stress: No Stress Concern Present (05/25/2021)   Hydro    Feeling of Stress : Not at all  Social Connections: Socially Isolated (05/25/2021)   Social Connection and Isolation Panel [NHANES]    Frequency of Communication with Friends and Family: More than three times a week    Frequency of Social Gatherings with Friends and Family: More than three times a week    Attends Religious Services: Never    Marine scientist or Organizations: No    Attends Archivist Meetings: Never    Marital Status: Widowed  Intimate Partner Violence: Not At Risk (05/25/2021)   Humiliation, Afraid, Rape, and Kick questionnaire    Fear of Current or Ex-Partner: No    Emotionally Abused: No    Physically Abused: No    Sexually Abused: No    No  Known Allergies  Current Outpatient Medications  Medication Sig Dispense Refill   albuterol (PROVENTIL) (2.5 MG/3ML) 0.083% nebulizer solution Take 3 mLs (2.5 mg total) by nebulization every 6 (six) hours as needed for wheezing or shortness of breath. 150 mL 1   allopurinol (ZYLOPRIM) 100 MG tablet Take 1 tablet (100 mg total) by mouth daily. 30 tablet 3   aspirin 81 MG EC tablet Take by mouth.     atorvastatin (LIPITOR) 40 MG tablet Take 1 tablet (40 mg total) by mouth  daily. 90 tablet 3   cephALEXin (KEFLEX) 500 MG capsule Take 1 capsule (500 mg total) by mouth 4 (four) times daily for 10 days. 40 capsule 0   cycloSPORINE (RESTASIS) 0.05 % ophthalmic emulsion Place 1 drop into both eyes 2 (two) times daily. 60 mL 3   diclofenac Sodium (VOLTAREN) 1 % GEL TAKE ON AFFECTED AREAS AS NEEDED FOR PAIN UP TO 4 TIMES A DAY 400 g 3   gabapentin (NEURONTIN) 300 MG capsule TAKE 1 CAPSULE BY MOUTH FOUR TIMES A DAY 120 capsule 0   JARDIANCE 25 MG TABS tablet TAKE 1 TABLET (25 MG TOTAL) BY MOUTH DAILY. 90 tablet 3   levocetirizine (XYZAL) 5 MG tablet TAKE 1 TABLET BY MOUTH TWICE A DAY (Patient taking differently: Take 5 mg by mouth 2 (two) times daily.) 180 tablet 3   Multiple Vitamins-Minerals (MULTIVITAMIN WITH MINERALS) tablet Take 1 tablet by mouth daily.     tiotropium (SPIRIVA) 18 MCG inhalation capsule INHALE 1 CAPSULE VIA HANDIHALER ONCE DAILY AT THE SAME TIME EVERY DAY 30 capsule 2   triamcinolone cream (KENALOG) 0.1 % Apply topically as needed (Skin itching). 30 g 0   WIXELA INHUB 250-50 MCG/ACT AEPB INHALE 1 PUFF INTO THE LUNGS IN THE MORNING AND AT BEDTIME. 60 each 0   WIXELA INHUB 250-50 MCG/ACT AEPB INHALE 1 PUFF INTO THE LUNGS 2 (TWO) TIMES DAILY. IN THE MORNING AND AT BEDTIME. 60 each 2   No current facility-administered medications for this visit.    PHYSICAL EXAM Vitals:   01/18/22 1345  BP: 127/79  Pulse: 90  Resp: 20  Temp: 98.8 F (37.1 C)  SpO2: 92%  Weight: 175 lb (79.4 kg)   Height: 5' 11.5" (1.816 m)   Elderly man in no distress Regular rate and rhythm Unlabored breathing No palpable pedal pulses R toes 1 / 5 surgically absent. Second toe ulcerated with some purulence and exposed bone. No palpable pedal pulses Long R medial leg scar c/w GSV harvest and bypass    PERTINENT LABORATORY AND RADIOLOGIC DATA  Most recent CBC    Latest Ref Rng & Units 07/03/2021    4:33 AM 07/02/2021    2:03 AM 07/01/2021    4:01 PM  CBC  WBC 4.0 - 10.5 K/uL  15.1  14.4   Hemoglobin 13.0 - 17.0 g/dL 13.0  12.7  13.8   Hematocrit 39.0 - 52.0 % 36.9  37.1  40.1   Platelets 150 - 400 K/uL  213  239      Most recent CMP    Latest Ref Rng & Units 11/29/2021    4:24 PM 10/19/2021    2:57 PM 07/03/2021    1:26 AM  CMP  Glucose 70 - 99 mg/dL 247  151  302   BUN 8 - 27 mg/dL 33  33  58   Creatinine 0.76 - 1.27 mg/dL 2.03  2.21  1.94   Sodium 134 - 144 mmol/L 140  141  137   Potassium 3.5 - 5.2 mmol/L 5.1  4.6  3.7   Chloride 96 - 106 mmol/L 100  101  109   CO2 20 - 29 mmol/L 20  21  18    Calcium 8.6 - 10.2 mg/dL 10.0  10.2  9.0     Renal function CrCl cannot be calculated (Patient's most recent lab result is older than the maximum 21 days allowed.).  Hemoglobin A1C (%)  Date Value  11/29/2021 8.6 (A)   HbA1c, POC (controlled diabetic  range) (%)  Date Value  10/19/2021 7.9 (A)    LDL Chol Calc (NIH)  Date Value Ref Range Status  01/08/2021 59 0 - 99 mg/dL Final   Direct LDL  Date Value Ref Range Status  07/09/2013 65 mg/dL Final    Comment:    ATP III Classification (LDL):       < 100        mg/dL         Optimal      100 - 129     mg/dL         Near or Above Optimal      130 - 159     mg/dL         Borderline High      160 - 189     mg/dL         High       > 190        mg/dL         Very High       +-------+-----------+-----------+------------+------------+  ABI/TBIToday's ABIToday's TBIPrevious ABIPrevious TBI   +-------+-----------+-----------+------------+------------+  Right 0.57       0          0.51        0.23          +-------+-----------+-----------+------------+------------+  Left  0.72       0.22       0.94        0.69          +-------+-----------+-----------+------------+------------+   Yevonne Aline. Stanford Breed, MD [facility] Phone Number: [phone] 01/18/2022 3:37 PM   Total time spent on preparing this encounter including chart review, data review, collecting history, examining the patient, coordinating care for this new patient, 60 minutes.  Portions of this report may have been transcribed using voice recognition software.  Every effort has been made to ensure accuracy; however, inadvertent computerized transcription errors may still be present.

## 2022-01-17 NOTE — Telephone Encounter (Signed)
HIPAA compliant callback message left.   Please, advise him that his foot xray did not report bone infection. Follow up podiatry as planned.

## 2022-01-18 ENCOUNTER — Other Ambulatory Visit: Payer: Self-pay

## 2022-01-18 ENCOUNTER — Encounter: Payer: Self-pay | Admitting: Vascular Surgery

## 2022-01-18 ENCOUNTER — Ambulatory Visit: Payer: PPO | Admitting: Vascular Surgery

## 2022-01-18 ENCOUNTER — Ambulatory Visit (HOSPITAL_COMMUNITY)
Admission: RE | Admit: 2022-01-18 | Discharge: 2022-01-18 | Disposition: A | Discharge status: Home / Self Care | Payer: PPO | Source: Ambulatory Visit | Attending: Vascular Surgery | Admitting: Vascular Surgery

## 2022-01-18 VITALS — BP 127/79 | HR 90 | Temp 98.8°F | Resp 20 | Ht 71.5 in | Wt 175.0 lb

## 2022-01-18 DIAGNOSIS — E1151 Type 2 diabetes mellitus with diabetic peripheral angiopathy without gangrene: Secondary | ICD-10-CM

## 2022-01-18 DIAGNOSIS — L97514 Non-pressure chronic ulcer of other part of right foot with necrosis of bone: Secondary | ICD-10-CM | POA: Diagnosis not present

## 2022-01-18 DIAGNOSIS — I739 Peripheral vascular disease, unspecified: Secondary | ICD-10-CM

## 2022-01-18 DIAGNOSIS — I7025 Atherosclerosis of native arteries of other extremities with ulceration: Secondary | ICD-10-CM

## 2022-01-21 ENCOUNTER — Other Ambulatory Visit: Payer: Self-pay

## 2022-01-21 ENCOUNTER — Encounter (HOSPITAL_COMMUNITY)
Admission: RE | Disposition: A | Discharge status: Home / Self Care | Payer: Self-pay | Source: Home / Self Care | Attending: Vascular Surgery

## 2022-01-21 ENCOUNTER — Ambulatory Visit (HOSPITAL_COMMUNITY)
Admission: RE | Admit: 2022-01-21 | Discharge: 2022-01-21 | Disposition: A | Discharge status: Home / Self Care | Payer: PPO | Attending: Vascular Surgery | Admitting: Vascular Surgery

## 2022-01-21 DIAGNOSIS — I70235 Atherosclerosis of native arteries of right leg with ulceration of other part of foot: Secondary | ICD-10-CM | POA: Diagnosis not present

## 2022-01-21 DIAGNOSIS — L97519 Non-pressure chronic ulcer of other part of right foot with unspecified severity: Secondary | ICD-10-CM | POA: Insufficient documentation

## 2022-01-21 DIAGNOSIS — I7025 Atherosclerosis of native arteries of other extremities with ulceration: Secondary | ICD-10-CM

## 2022-01-21 HISTORY — PX: ABDOMINAL AORTOGRAM W/LOWER EXTREMITY: CATH118223

## 2022-01-21 LAB — POCT I-STAT, CHEM 8
BUN: 57 mg/dL — ABNORMAL HIGH (ref 8–23)
Calcium, Ion: 1.23 mmol/L (ref 1.15–1.40)
Chloride: 104 mmol/L (ref 98–111)
Creatinine, Ser: 1.9 mg/dL — ABNORMAL HIGH (ref 0.61–1.24)
Glucose, Bld: 180 mg/dL — ABNORMAL HIGH (ref 70–99)
HCT: 44 % (ref 39.0–52.0)
Hemoglobin: 15 g/dL (ref 13.0–17.0)
Potassium: 4.9 mmol/L (ref 3.5–5.1)
Sodium: 137 mmol/L (ref 135–145)
TCO2: 23 mmol/L (ref 22–32)

## 2022-01-21 LAB — GLUCOSE, CAPILLARY: Glucose-Capillary: 148 mg/dL — ABNORMAL HIGH (ref 70–99)

## 2022-01-21 SURGERY — ABDOMINAL AORTOGRAM W/LOWER EXTREMITY
Anesthesia: LOCAL

## 2022-01-21 MED ORDER — MIDAZOLAM HCL 2 MG/2ML IJ SOLN
INTRAMUSCULAR | Status: DC | PRN
  Administered 2022-01-21: 1 mg via INTRAVENOUS

## 2022-01-21 MED ORDER — LABETALOL HCL 5 MG/ML IV SOLN: 10.0000 mg | INTRAVENOUS | Status: DC | PRN

## 2022-01-21 MED ORDER — SODIUM CHLORIDE 0.9 % IV SOLN: INTRAVENOUS | Status: DC

## 2022-01-21 MED ORDER — FENTANYL CITRATE (PF) 100 MCG/2ML IJ SOLN
INTRAMUSCULAR | Status: AC
  Filled 2022-01-21: qty 2

## 2022-01-21 MED ORDER — IODIXANOL 320 MG/ML IV SOLN
INTRAVENOUS | Status: DC | PRN
  Administered 2022-01-21: 75 mL

## 2022-01-21 MED ORDER — FENTANYL CITRATE (PF) 100 MCG/2ML IJ SOLN
INTRAMUSCULAR | Status: DC | PRN
  Administered 2022-01-21: 50 ug via INTRAVENOUS

## 2022-01-21 MED ORDER — SODIUM CHLORIDE 0.9% FLUSH: 3.0000 mL | INTRAVENOUS | Status: DC | PRN

## 2022-01-21 MED ORDER — LIDOCAINE HCL (PF) 1 % IJ SOLN
INTRAMUSCULAR | Status: DC | PRN
  Administered 2022-01-21: 20 mL

## 2022-01-21 MED ORDER — HEPARIN (PORCINE) IN NACL 1000-0.9 UT/500ML-% IV SOLN
INTRAVENOUS | Status: AC
  Filled 2022-01-21: qty 500

## 2022-01-21 MED ORDER — ACETAMINOPHEN 325 MG PO TABS: 650.0000 mg | ORAL_TABLET | ORAL | Status: DC | PRN

## 2022-01-21 MED ORDER — SODIUM CHLORIDE 0.9% FLUSH: 3.0000 mL | Freq: Two times a day (BID) | INTRAVENOUS | Status: DC

## 2022-01-21 MED ORDER — ONDANSETRON HCL 4 MG/2ML IJ SOLN: 4.0000 mg | Freq: Four times a day (QID) | INTRAMUSCULAR | Status: DC | PRN

## 2022-01-21 MED ORDER — SODIUM CHLORIDE 0.9 % WEIGHT BASED INFUSION: 1.0000 mL/kg/h | INTRAVENOUS | Status: DC

## 2022-01-21 MED ORDER — SODIUM CHLORIDE 0.9 % IV SOLN: 250.0000 mL | INTRAVENOUS | Status: DC | PRN

## 2022-01-21 MED ORDER — HYDRALAZINE HCL 20 MG/ML IJ SOLN: 5.0000 mg | INTRAMUSCULAR | Status: DC | PRN

## 2022-01-21 MED ORDER — LIDOCAINE HCL (PF) 1 % IJ SOLN
INTRAMUSCULAR | Status: AC
  Filled 2022-01-21: qty 30

## 2022-01-21 MED ORDER — HEPARIN (PORCINE) IN NACL 1000-0.9 UT/500ML-% IV SOLN
INTRAVENOUS | Status: DC | PRN
  Administered 2022-01-21 (×2): 500 mL

## 2022-01-21 MED ORDER — MIDAZOLAM HCL 2 MG/2ML IJ SOLN
INTRAMUSCULAR | Status: AC
  Filled 2022-01-21: qty 2

## 2022-01-21 SURGICAL SUPPLY — 12 items
CATH OMNI FLUSH 5F 65CM (CATHETERS) IMPLANT
DEVICE CLOSURE MYNXGRIP 5F (Vascular Products) IMPLANT
GUIDEWIRE ANGLED .035X150CM (WIRE) IMPLANT
KIT ANGIASSIST CO2 SYSTEM (KITS) IMPLANT
KIT MICROPUNCTURE NIT STIFF (SHEATH) IMPLANT
KIT PV (KITS) ×1 IMPLANT
SHEATH PINNACLE 5F 10CM (SHEATH) IMPLANT
SHEATH PROBE COVER 6X72 (BAG) IMPLANT
STOPCOCK MORSE 400PSI 3WAY (MISCELLANEOUS) IMPLANT
TRANSDUCER W/STOPCOCK (MISCELLANEOUS) ×1 IMPLANT
TRAY PV CATH (CUSTOM PROCEDURE TRAY) ×1 IMPLANT
WIRE BENTSON .035X145CM (WIRE) IMPLANT

## 2022-01-21 NOTE — Op Note (Signed)
DATE OF SERVICE: 01/21/2022  PATIENT:  [name]  88 y.o. male  PRE-OPERATIVE DIAGNOSIS:  Atherosclerosis of native arteries of right lower extremity causing ulceration  POST-OPERATIVE DIAGNOSIS:  Same  PROCEDURE:   1) Ultrasound guided left common femoral access 2) Aortogram 3) Right lower extremity angiogram with third order cannulation (76mL total contrast) 4) Conscious sedation (46 minutes)  SURGEON:  Yevonne Aline. Stanford Breed, MD  ASSISTANT: none  ANESTHESIA:   general  ESTIMATED BLOOD LOSS: minimal  LOCAL MEDICATIONS USED:  LIDOCAINE   COUNTS: confirmed correct.  PATIENT DISPOSITION:  PACU - hemodynamically stable.   Delay start of Pharmacological VTE agent (>24hrs) due to surgical blood loss or risk of bleeding: no  INDICATION FOR PROCEDURE: [name] is a 88 y.o. male with right great toe ulceration. Remote history of femoral-tibial bypass. Non-invasive testing suggests severe PAD. After careful discussion of risks, benefits, and alternatives the patient was offered angiography. The patient  understood and wished to proceed.  OPERATIVE FINDINGS:  Terminal aorta and iliac arteries: Widely patent  Right lower extremity: Common femoral artery: widely patent  Profunda femoris artery: widely patent  Superficial femoral artery: diffusely diseased. Occludes in thigh. Popliteal artery: occluded Anterior tibial artery: occluded Tibioperoneal trunk: occluded Peroneal artery: occluded Posterior tibial artery: reconstitutes about the ankle Pedal circulation: fills via PT  GLASS score. FP 4. IP 4. Stage IV.  WIfI score: 2 / 3 / 1. Clinical stage IV. High potential benefit from revascularization.   DESCRIPTION OF PROCEDURE: After identification of the patient in the pre-operative holding area, the patient was transferred to the operating room. The patient was positioned supine on the operating room table.  Anesthesia was induced. The groins was prepped and draped  in standard fashion. A surgical pause was performed confirming correct patient, procedure, and operative location.  The left groin was anesthetized with subcutaneous injection of 1% lidocaine. Using ultrasound guidance, the left common femoral artery was accessed with micropuncture technique. Fluoroscopy was used to confirm cannulation over the femoral head. The 428F sheath was upsized to 28F.   A Benson wire was advanced into the distal aorta. Over the wire an omni flush catheter was advanced to the level of L2. Aortogram was performed - see above for details.  The right common iliac artery was selected with an omniflush catheter and glidewire guidewire. The wire was advanced into the common femoral artery. Over the wire the omni flush catheter was advanced into the external iliac artery. Selective angiography was performed - see above for details.   A mynx device was used to close the arteriotomy. Hemostasis was excellent upon completion.  Conscious sedation was administered with the use of IV fentanyl and midazolam under continuous physician and nurse monitoring.  Heart rate, blood pressure, and oxygen saturation were continuously monitored.  Total sedation time was 46 minutes  Upon completion of the case instrument and sharps counts were confirmed correct. The patient was transferred to the PACU in good condition. I was present for all portions of the procedure.  PLAN: Challenging situation.  He does have a very distal target for bypass.  He has no greater saphenous vein usable, as this was used during his previous bypass.  A common femoral to posterior tibial artery bypass with PTFE could be done.  Will discuss with the family and patient to review risk, benefit, and alternatives.  Yevonne Aline. Stanford Breed, MD [facility] Phone Number: [phone] 01/21/2022 11:50 AM

## 2022-01-24 ENCOUNTER — Encounter (HOSPITAL_COMMUNITY): Payer: Self-pay | Admitting: Vascular Surgery

## 2022-01-31 NOTE — Progress Notes (Signed)
[facility] [place]  ASSESSMENT / PLAN: [name] is a 88 y.o. male with atherosclerosis of native arteries of right lower extremity causing ulceration.  Patient counseled patients with chronic limb threatening ischemia have an annual risk of cardiovascular mortality of 25% and a high risk of amputation.   WIfI score: 2 / 3 / 1. Clinical stage IV. High potential benefit from revascularization.  Recommend the following which can slow the progression of atherosclerosis and reduce the risk of major adverse cardiac / limb events:  Complete cessation from all tobacco products. Blood glucose control with goal A1c < 7%. Blood pressure control with goal blood pressure < 140/90 mmHg. Lipid reduction therapy with goal LDL-C <100 mg/dL (<70 if symptomatic from PAD).  Aspirin 81mg  PO QD.  Atorvastatin 40-80mg  PO QD (or other "high intensity" statin therapy).  Plan right lower extremity angiogram with possible intervention via left common femoral artery approach in cath lab 01/21/22.   CHIEF COMPLAINT: right foot ulcer  HISTORY OF PRESENT ILLNESS: [name] is a 88 y.o. male referred to clinic for evaluation of right foot ulceration.  The patient was seen remotely in the past by partner Dr. Carlis Abbott for the same.  He underwent angiography, and ultimately elected to pursue intervention at [facility]. Dear Dr. Nyoka Cowden performed a right superficial femoral to posterior tibial artery bypass with spliced greater saphenous vein.  This remained patent for a short time allowing his first digit to heal.  He then developed second digit ulceration.  The bypass was found to be thrombosed.  He did local wound care which ultimately was able to heal the toe.  Over the past several weeks he has developed new, worsening ulceration of the second toe.  He presents today at the request of his podiatrist for vascular evaluation.  Past Medical History:  Diagnosis Date   ANEMIA, IRON  DEF, NOS 01/15/2007   Qualifier: Diagnosis of  By: Jeannine Kitten MD, Matthew     Arthritis    Cataract extraction status of left eye 2016   Cataract extraction status of right eye 2016   CHOLECYSTECTOMY, LAPAROSCOPIC, HX OF 04/12/2007   Qualifier: Diagnosis of  By: Netty Starring  MD, Lucianne Muss     Critical lower limb ischemia ([place]) 10/02/2018   Diabetes mellitus without complication ([place])    Gout 2007   Hypertension    Toe ulcer ([place]) 09/20/2018    Past Surgical History:  Procedure Laterality Date   ABDOMINAL AORTOGRAM W/LOWER EXTREMITY N/A 10/03/2018   Procedure: ABDOMINAL AORTOGRAM W/LOWER EXTREMITY;  Surgeon: Marty Heck, MD;  Location: [facility];  Service: Cardiovascular;  Laterality: N/A;  Bilateral    ABDOMINAL AORTOGRAM W/LOWER EXTREMITY N/A 01/21/2022   Procedure: ABDOMINAL AORTOGRAM W/LOWER EXTREMITY;  Surgeon: Cherre Robins, MD;  Location: [place] [facility];  Service: Cardiovascular;  Laterality: N/A;   CHOLECYSTECTOMY      Family History  Problem Relation Age of Onset   Asthma Mother    Arthritis Sister    Alcohol abuse Sister    Cirrhosis Sister    Asthma Sister    Heart disease Brother    Stroke Brother    Diabetes Brother    Cancer Brother        colon   Stroke Brother    Heart disease Brother    Arthritis Daughter    Asthma Daughter    Diabetes Son     Social History   Socioeconomic History   Marital status: Widowed  Spouse name: Not on file   Number of children: Not on file   Years of education: 10   Highest education level: 10th grade  Occupational History   Occupation: RetiredAnimal nutritionist Work  Tobacco Use   Smoking status: Former    Packs/day: 0.50    Years: 60.00    Total pack years: 30.00    Types: Cigars, Cigarettes    Start date: [birth year]/08/1951    Quit date: 06/08/2011    Years since quitting: 10.6    Passive exposure: Past   Smokeless tobacco: Never  Vaping Use   Vaping Use: Never used  Substance and Sexual Activity   Alcohol use:  No    Comment: Hx of use   Drug use: No   Sexual activity: Not Currently  Other Topics Concern   Not on file  Social History Narrative   Patient lives with his daughter and her husband.   Patients wife died in June 21, 2018.   Health Care POA:    Emergency Contact: Daughter- [name] [phone]   End of Life Plan:       Any pets: none   Diet: Patient has a varied diet and reports eating bacon and eggs every morning. Does not like most fruit. Likes flavored water, kool-aid.   No current exercise.     Seatbelts: Pt reports wearing seatbelt when in vehicle.   Hobbies: likes to watch [employer] and rest                Social Determinants of Health   Financial Resource Strain: Low Risk  (05/25/2021)   Overall Financial Resource Strain (CARDIA)    Difficulty of Paying Living Expenses: Not very hard  Food Insecurity: Food Insecurity Present (05/25/2021)   Hunger Vital Sign    Worried About Running Out of Food in the Last Year: Sometimes true    Ran Out of Food in the Last Year: Sometimes true  Transportation Needs: No Transportation Needs (05/25/2021)   PRAPARE - Hydrologist (Medical): No    Lack of Transportation (Non-Medical): No  Physical Activity: Inactive (05/25/2021)   Exercise Vital Sign    Days of Exercise per Week: 0 days    Minutes of Exercise per Session: 0 min  Stress: No Stress Concern Present (05/25/2021)   [place]    Feeling of Stress : Not at all  Social Connections: Socially Isolated (05/25/2021)   Social Connection and Isolation Panel [NHANES]    Frequency of Communication with Friends and Family: More than three times a week    Frequency of Social Gatherings with Friends and Family: More than three times a week    Attends Religious Services: Never    Marine scientist or Organizations: No    Attends Archivist Meetings: Never    Marital Status: Widowed  Intimate  Partner Violence: Not At Risk (05/25/2021)   Humiliation, Afraid, Rape, and Kick questionnaire    Fear of Current or Ex-Partner: No    Emotionally Abused: No    Physically Abused: No    Sexually Abused: No    No Known Allergies  Current Outpatient Medications  Medication Sig Dispense Refill   albuterol (PROVENTIL) (2.5 MG/3ML) 0.083% nebulizer solution Take 3 mLs (2.5 mg total) by nebulization every 6 (six) hours as needed for wheezing or shortness of breath. 150 mL 1   albuterol (VENTOLIN HFA) 108 (90 Base) MCG/ACT inhaler Inhale 2 puffs into the  lungs every 6 (six) hours as needed for wheezing or shortness of breath.     allopurinol (ZYLOPRIM) 100 MG tablet Take 1 tablet (100 mg total) by mouth daily. (Patient taking differently: Take 150 mg by mouth daily.) 30 tablet 3   aspirin 81 MG EC tablet Take 81 mg by mouth daily.     atorvastatin (LIPITOR) 40 MG tablet Take 1 tablet (40 mg total) by mouth daily. 90 tablet 3   cycloSPORINE (RESTASIS) 0.05 % ophthalmic emulsion Place 1 drop into both eyes 2 (two) times daily. (Patient taking differently: Place 1 drop into both eyes 2 (two) times daily as needed (dry eyes).) 60 mL 3   diclofenac Sodium (VOLTAREN) 1 % GEL TAKE ON AFFECTED AREAS AS NEEDED FOR PAIN UP TO 4 TIMES A DAY 400 g 3   gabapentin (NEURONTIN) 300 MG capsule TAKE 1 CAPSULE BY MOUTH FOUR TIMES A DAY (Patient taking differently: Take 300-600 mg by mouth See admin instructions. 300 mg in the morning, 600 mg at bedtime) 120 capsule 0   JARDIANCE 25 MG TABS tablet TAKE 1 TABLET (25 MG TOTAL) BY MOUTH DAILY. 90 tablet 3   levocetirizine (XYZAL) 5 MG tablet TAKE 1 TABLET BY MOUTH TWICE A DAY (Patient taking differently: Take 10 mg by mouth every morning.) 180 tablet 3   Multiple Vitamins-Minerals (MULTIVITAMIN WITH MINERALS) tablet Take 1 tablet by mouth daily.     tiotropium (SPIRIVA) 18 MCG inhalation capsule INHALE 1 CAPSULE VIA HANDIHALER ONCE DAILY AT THE SAME TIME EVERY DAY 30  capsule 2   triamcinolone cream (KENALOG) 0.1 % Apply topically as needed (Skin itching). (Patient not taking: Reported on 01/20/2022) 30 g 0   WIXELA INHUB 250-50 MCG/ACT AEPB INHALE 1 PUFF INTO THE LUNGS 2 (TWO) TIMES DAILY. IN THE MORNING AND AT BEDTIME. 60 each 2   No current facility-administered medications for this visit.    PHYSICAL EXAM There were no vitals filed for this visit.  Elderly man in no distress Regular rate and rhythm Unlabored breathing No palpable pedal pulses R toes 1 / 5 surgically absent. Second toe ulcerated with some purulence and exposed bone. No palpable pedal pulses Long R medial leg scar c/w GSV harvest and bypass    PERTINENT LABORATORY AND RADIOLOGIC DATA  Most recent CBC    Latest Ref Rng & Units 01/21/2022    9:02 AM 07/03/2021    4:33 AM 07/02/2021    2:03 AM  CBC  WBC 4.0 - 10.5 K/uL   15.1   Hemoglobin 13.0 - 17.0 g/dL 15.0  13.0  12.7   Hematocrit 39.0 - 52.0 % 44.0  36.9  37.1   Platelets 150 - 400 K/uL   213      Most recent CMP    Latest Ref Rng & Units 01/21/2022    9:02 AM 11/29/2021    4:24 PM 10/19/2021    2:57 PM  CMP  Glucose 70 - 99 mg/dL 180  247  151   BUN 8 - 23 mg/dL 57  33  33   Creatinine 0.61 - 1.24 mg/dL 1.90  2.03  2.21   Sodium 135 - 145 mmol/L 137  140  141   Potassium 3.5 - 5.1 mmol/L 4.9  5.1  4.6   Chloride 98 - 111 mmol/L 104  100  101   CO2 20 - 29 mmol/L  20  21   Calcium 8.6 - 10.2 mg/dL  10.0  10.2     Renal  function Estimated Creatinine Clearance: 29.7 mL/min (A) (by C-G formula based on SCr of 1.9 mg/dL (H)).  Hemoglobin A1C (%)  Date Value  11/29/2021 8.6 (A)   HbA1c, POC (controlled diabetic range) (%)  Date Value  10/19/2021 7.9 (A)    LDL Chol Calc (NIH)  Date Value Ref Range Status  01/08/2021 59 0 - 99 mg/dL Final   Direct LDL  Date Value Ref Range Status  07/09/2013 65 mg/dL Final    Comment:    ATP III Classification (LDL):       < 100        mg/dL         Optimal       100 - 129     mg/dL         Near or Above Optimal      130 - 159     mg/dL         Borderline High      160 - 189     mg/dL         High       > 190        mg/dL         Very High       +-------+-----------+-----------+------------+------------+  ABI/TBIToday's ABIToday's TBIPrevious ABIPrevious TBI  +-------+-----------+-----------+------------+------------+  Right 0.57       0          0.51        0.23          +-------+-----------+-----------+------------+------------+  Left  0.72       0.22       0.94        0.69          +-------+-----------+-----------+------------+------------+   Yevonne Aline. Stanford Breed, MD [facility] Phone Number: [phone] 01/31/2022 7:25 PM   Total time spent on preparing this encounter including chart review, data review, collecting history, examining the patient, coordinating care for this new patient, 60 minutes.  Portions of this report may have been transcribed using voice recognition software.  Every effort has been made to ensure accuracy; however, inadvertent computerized transcription errors may still be present.

## 2022-01-31 NOTE — H&P (View-Only) (Signed)
[facility] OF [place]  ASSESSMENT / PLAN: [name] is a 88 y.o. male with atherosclerosis of native arteries of right lower extremity causing ulceration.  Patient counseled patients with chronic limb threatening ischemia have an annual risk of cardiovascular mortality of 25% and a high risk of amputation.   WIfI score: 2 / 3 / 1. Clinical stage IV. High potential benefit from revascularization.  Recommend the following which can slow the progression of atherosclerosis and reduce the risk of major adverse cardiac / limb events:  Complete cessation from all tobacco products. Blood glucose control with goal A1c < 7%. Blood pressure control with goal blood pressure < 140/90 mmHg. Lipid reduction therapy with goal LDL-C <100 mg/dL (<70 if symptomatic from PAD).  Aspirin 81mg  PO QD.  Atorvastatin 40-80mg  PO QD (or other "high intensity" statin therapy).  Plan right femoral - pedal bypass with PTFE 02/03/22.   CHIEF COMPLAINT: right foot ulcer  HISTORY OF PRESENT ILLNESS: [name] is a 88 y.o. male referred to clinic for evaluation of right foot ulceration.  The patient was seen remotely in the past by partner Dr. Carlis Abbott for the same.  He underwent angiography, and ultimately elected to pursue intervention at [facility]. Dr. Nyoka Cowden performed a right superficial femoral to posterior tibial artery bypass with spliced greater saphenous vein.  This remained patent for a short time allowing his first digit to heal.  He then developed second digit ulceration.  The bypass was found to be thrombosed.  He did local wound care which ultimately was able to heal the toe.  Over the past several weeks he has developed new, worsening ulceration of the second toe.  He presents today at the request of his podiatrist for vascular evaluation.  02/01/22: Patient returns after angiogram to review findings and my recommendations. He is amenable to proceeding with bypass.    Past Medical History:  Diagnosis Date   ANEMIA, IRON DEF, NOS 01/15/2007   Qualifier: Diagnosis of  By: Jeannine Kitten MD, Matthew     Arthritis    Cataract extraction status of left eye 2016   Cataract extraction status of right eye 2016   CHOLECYSTECTOMY, LAPAROSCOPIC, HX OF 04/12/2007   Qualifier: Diagnosis of  By: Netty Starring  MD, Lucianne Muss     Critical lower limb ischemia ([place]) 10/02/2018   Diabetes mellitus without complication ([place])    Gout 2007   Hypertension    Toe ulcer ([place]) 09/20/2018    Past Surgical History:  Procedure Laterality Date   ABDOMINAL AORTOGRAM W/LOWER EXTREMITY N/A 10/03/2018   Procedure: ABDOMINAL AORTOGRAM W/LOWER EXTREMITY;  Surgeon: Marty Heck, MD;  Location: [place] [facility];  Service: Cardiovascular;  Laterality: N/A;  Bilateral    ABDOMINAL AORTOGRAM W/LOWER EXTREMITY N/A 01/21/2022   Procedure: ABDOMINAL AORTOGRAM W/LOWER EXTREMITY;  Surgeon: Cherre Robins, MD;  Location: [place] [facility];  Service: Cardiovascular;  Laterality: N/A;   CHOLECYSTECTOMY      Family History  Problem Relation Age of Onset   Asthma Mother    Arthritis Sister    Alcohol abuse Sister    Cirrhosis Sister    Asthma Sister    Heart disease Brother    Stroke Brother    Diabetes Brother    Cancer Brother        colon   Stroke Brother    Heart disease Brother    Arthritis Daughter    Asthma Daughter    Diabetes Son     Social History  Socioeconomic History   Marital status: Widowed    Spouse name: Not on file   Number of children: Not on file   Years of education: 10   Highest education level: 10th grade  Occupational History   Occupation: RetiredAnimal nutritionist Work  Tobacco Use   Smoking status: Former    Packs/day: 0.50    Years: 60.00    Total pack years: 30.00    Types: Cigars, Cigarettes    Start date: [birth year]/08/1949    Quit date: 06/08/2011    Years since quitting: 10.6    Passive exposure: Past   Smokeless tobacco: Never  Vaping Use   Vaping Use:  Never used  Substance and Sexual Activity   Alcohol use: No    Comment: Hx of use   Drug use: No   Sexual activity: Not Currently  Other Topics Concern   Not on file  Social History Narrative   Patient lives with his daughter and her husband.   Patients wife died in 05/23/2018.   Health Care POA:    Emergency Contact: Daughter- Elberta Fortis [phone]   End of Life Plan:       Any pets: none   Diet: Patient has a varied diet and reports eating bacon and eggs every morning. Does not like most fruit. Likes flavored water, kool-aid.   No current exercise.     Seatbelts: Pt reports wearing seatbelt when in vehicle.   Hobbies: likes to watch [employer] and rest                Social Determinants of Health   Financial Resource Strain: Low Risk  (05/25/2021)   Overall Financial Resource Strain (CARDIA)    Difficulty of Paying Living Expenses: Not very hard  Food Insecurity: Food Insecurity Present (05/25/2021)   Hunger Vital Sign    Worried About Running Out of Food in the Last Year: Sometimes true    Ran Out of Food in the Last Year: Sometimes true  Transportation Needs: No Transportation Needs (05/25/2021)   PRAPARE - Hydrologist (Medical): No    Lack of Transportation (Non-Medical): No  Physical Activity: Inactive (05/25/2021)   Exercise Vital Sign    Days of Exercise per Week: 0 days    Minutes of Exercise per Session: 0 min  Stress: No Stress Concern Present (05/25/2021)   [place]    Feeling of Stress : Not at all  Social Connections: Socially Isolated (05/25/2021)   Social Connection and Isolation Panel [NHANES]    Frequency of Communication with Friends and Family: More than three times a week    Frequency of Social Gatherings with Friends and Family: More than three times a week    Attends Religious Services: Never    Marine scientist or Organizations: No    Attends Theatre manager Meetings: Never    Marital Status: Widowed  Intimate Partner Violence: Not At Risk (05/25/2021)   Humiliation, Afraid, Rape, and Kick questionnaire    Fear of Current or Ex-Partner: No    Emotionally Abused: No    Physically Abused: No    Sexually Abused: No    No Known Allergies  Current Outpatient Medications  Medication Sig Dispense Refill   albuterol (PROVENTIL) (2.5 MG/3ML) 0.083% nebulizer solution Take 3 mLs (2.5 mg total) by nebulization every 6 (six) hours as needed for wheezing or shortness of breath. 150 mL 1   albuterol (VENTOLIN HFA)  108 (90 Base) MCG/ACT inhaler Inhale 2 puffs into the lungs every 6 (six) hours as needed for wheezing or shortness of breath.     allopurinol (ZYLOPRIM) 100 MG tablet Take 1 tablet (100 mg total) by mouth daily. (Patient taking differently: Take 150 mg by mouth daily.) 30 tablet 3   aspirin 81 MG EC tablet Take 81 mg by mouth daily.     atorvastatin (LIPITOR) 40 MG tablet Take 1 tablet (40 mg total) by mouth daily. 90 tablet 3   cycloSPORINE (RESTASIS) 0.05 % ophthalmic emulsion Place 1 drop into both eyes 2 (two) times daily. (Patient taking differently: Place 1 drop into both eyes 2 (two) times daily as needed (dry eyes).) 60 mL 3   diclofenac Sodium (VOLTAREN) 1 % GEL TAKE ON AFFECTED AREAS AS NEEDED FOR PAIN UP TO 4 TIMES A DAY 400 g 3   gabapentin (NEURONTIN) 300 MG capsule TAKE 1 CAPSULE BY MOUTH FOUR TIMES A DAY (Patient taking differently: Take 300-600 mg by mouth See admin instructions. 300 mg in the morning, 600 mg at bedtime) 120 capsule 0   JARDIANCE 25 MG TABS tablet TAKE 1 TABLET (25 MG TOTAL) BY MOUTH DAILY. 90 tablet 3   levocetirizine (XYZAL) 5 MG tablet TAKE 1 TABLET BY MOUTH TWICE A DAY (Patient taking differently: Take 10 mg by mouth every morning.) 180 tablet 3   Multiple Vitamins-Minerals (MULTIVITAMIN WITH MINERALS) tablet Take 1 tablet by mouth daily.     tiotropium (SPIRIVA) 18 MCG inhalation capsule INHALE 1  CAPSULE VIA HANDIHALER ONCE DAILY AT THE SAME TIME EVERY DAY 30 capsule 2   triamcinolone cream (KENALOG) 0.1 % Apply topically as needed (Skin itching). (Patient not taking: Reported on 01/20/2022) 30 g 0   WIXELA INHUB 250-50 MCG/ACT AEPB INHALE 1 PUFF INTO THE LUNGS 2 (TWO) TIMES DAILY. IN THE MORNING AND AT BEDTIME. 60 each 2   No current facility-administered medications for this visit.    PHYSICAL EXAM There were no vitals filed for this visit.  Elderly man in no distress Regular rate and rhythm Unlabored breathing No palpable pedal pulses R toes 1 / 5 surgically absent. Second toe ulcerated with some purulence and exposed bone. No palpable pedal pulses Long R medial leg scar c/w GSV harvest and bypass    PERTINENT LABORATORY AND RADIOLOGIC DATA  Most recent CBC    Latest Ref Rng & Units 01/21/2022    9:02 AM 07/03/2021    4:33 AM 07/02/2021    2:03 AM  CBC  WBC 4.0 - 10.5 K/uL   15.1   Hemoglobin 13.0 - 17.0 g/dL 15.0  13.0  12.7   Hematocrit 39.0 - 52.0 % 44.0  36.9  37.1   Platelets 150 - 400 K/uL   213      Most recent CMP    Latest Ref Rng & Units 01/21/2022    9:02 AM 11/29/2021    4:24 PM 10/19/2021    2:57 PM  CMP  Glucose 70 - 99 mg/dL 180  247  151   BUN 8 - 23 mg/dL 57  33  33   Creatinine 0.61 - 1.24 mg/dL 1.90  2.03  2.21   Sodium 135 - 145 mmol/L 137  140  141   Potassium 3.5 - 5.1 mmol/L 4.9  5.1  4.6   Chloride 98 - 111 mmol/L 104  100  101   CO2 20 - 29 mmol/L  20  21   Calcium 8.6 - 10.2  mg/dL  10.0  10.2     Renal function Estimated Creatinine Clearance: 29.7 mL/min (A) (by C-G formula based on SCr of 1.9 mg/dL (H)).  Hemoglobin A1C (%)  Date Value  11/29/2021 8.6 (A)   HbA1c, POC (controlled diabetic range) (%)  Date Value  10/19/2021 7.9 (A)    LDL Chol Calc (NIH)  Date Value Ref Range Status  01/08/2021 59 0 - 99 mg/dL Final   Direct LDL  Date Value Ref Range Status  07/09/2013 65 mg/dL Final    Comment:    ATP III  Classification (LDL):       < 100        mg/dL         Optimal      100 - 129     mg/dL         Near or Above Optimal      130 - 159     mg/dL         Borderline High      160 - 189     mg/dL         High       > 190        mg/dL         Very High       +-------+-----------+-----------+------------+------------+  ABI/TBIToday's ABIToday's TBIPrevious ABIPrevious TBI  +-------+-----------+-----------+------------+------------+  Right 0.57       0          0.51        0.23          +-------+-----------+-----------+------------+------------+  Left  0.72       0.22       0.94        0.69          +-------+-----------+-----------+------------+------------+   Yevonne Aline. Stanford Breed, MD [facility] Phone Number: [phone] 01/31/2022 7:25 PM   Total time spent on preparing this encounter including chart review, data review, collecting history, examining the patient, coordinating care for this established patient, 20 minutes.   Portions of this report may have been transcribed using voice recognition software.  Every effort has been made to ensure accuracy; however, inadvertent computerized transcription errors may still be present.

## 2022-02-01 ENCOUNTER — Ambulatory Visit (INDEPENDENT_AMBULATORY_CARE_PROVIDER_SITE_OTHER): Payer: PPO | Admitting: Vascular Surgery

## 2022-02-01 ENCOUNTER — Encounter: Payer: Self-pay | Admitting: Vascular Surgery

## 2022-02-01 ENCOUNTER — Other Ambulatory Visit: Payer: Self-pay

## 2022-02-01 VITALS — BP 147/89 | HR 111 | Temp 97.7°F | Resp 20 | Ht 71.0 in | Wt 174.0 lb

## 2022-02-01 DIAGNOSIS — I70235 Atherosclerosis of native arteries of right leg with ulceration of other part of foot: Secondary | ICD-10-CM

## 2022-02-01 DIAGNOSIS — I739 Peripheral vascular disease, unspecified: Secondary | ICD-10-CM

## 2022-02-02 ENCOUNTER — Other Ambulatory Visit: Payer: Self-pay

## 2022-02-02 ENCOUNTER — Encounter (HOSPITAL_COMMUNITY): Payer: Self-pay | Admitting: Vascular Surgery

## 2022-02-02 NOTE — Progress Notes (Signed)
SDW CALL  Patient was given pre-op instructions over the phone. Patient verbalized understanding of instructions provided.   PCP - [facility] Cardiologist - denies  Chest x-ray - 07-01-21 EKG - 07-02-21 from hospital stay with CPAP, showed SVT. Repeat DOS Stress Test - denies ECHO - 11-14-2006 Cardiac Cath - denies  Fasting Blood Sugar - Doesn't check sugar at home Checks Blood Sugar _____ times a day  Blood Thinner Instructions: Aspirin Instructions: continue ASA  ERAS Protcol - NPO PRE-SURGERY Ensure or G2-   COVID TEST- n/a   Anesthesia review: no  Patient denies shortness of breath, fever, cough and chest pain over the phone call  Pt and dtr, [name], both thought Dr. Stanford Breed was amputating a Right toe during surgery. Toe amputation not on current surgical consent. Sent a message to [place], [place] with [facility] to make aware.

## 2022-02-03 ENCOUNTER — Other Ambulatory Visit: Payer: Self-pay

## 2022-02-03 ENCOUNTER — Inpatient Hospital Stay (HOSPITAL_COMMUNITY): Payer: PPO | Admitting: Certified Registered"

## 2022-02-03 ENCOUNTER — Encounter (HOSPITAL_COMMUNITY): Payer: Self-pay | Admitting: Vascular Surgery

## 2022-02-03 ENCOUNTER — Encounter (HOSPITAL_COMMUNITY)
Admission: RE | Disposition: A | Discharge status: Home Health | Payer: Self-pay | Source: Home / Self Care | Attending: Vascular Surgery

## 2022-02-03 ENCOUNTER — Inpatient Hospital Stay (HOSPITAL_COMMUNITY)
Admission: RE | Admit: 2022-02-03 | Discharge: 2022-02-07 | DRG: 253 | Disposition: A | Discharge status: Home Health | Payer: PPO | Attending: Vascular Surgery | Admitting: Vascular Surgery

## 2022-02-03 DIAGNOSIS — H9190 Unspecified hearing loss, unspecified ear: Secondary | ICD-10-CM | POA: Diagnosis not present

## 2022-02-03 DIAGNOSIS — Z811 Family history of alcohol abuse and dependence: Secondary | ICD-10-CM | POA: Diagnosis not present

## 2022-02-03 DIAGNOSIS — J449 Chronic obstructive pulmonary disease, unspecified: Secondary | ICD-10-CM | POA: Diagnosis not present

## 2022-02-03 DIAGNOSIS — Z87891 Personal history of nicotine dependence: Secondary | ICD-10-CM

## 2022-02-03 DIAGNOSIS — I70235 Atherosclerosis of native arteries of right leg with ulceration of other part of foot: Secondary | ICD-10-CM

## 2022-02-03 DIAGNOSIS — Z8261 Family history of arthritis: Secondary | ICD-10-CM | POA: Diagnosis not present

## 2022-02-03 DIAGNOSIS — I739 Peripheral vascular disease, unspecified: Secondary | ICD-10-CM | POA: Diagnosis present

## 2022-02-03 DIAGNOSIS — M1990 Unspecified osteoarthritis, unspecified site: Secondary | ICD-10-CM | POA: Diagnosis present

## 2022-02-03 DIAGNOSIS — Z7982 Long term (current) use of aspirin: Secondary | ICD-10-CM

## 2022-02-03 DIAGNOSIS — Z800 Family history of malignant neoplasm of digestive organs: Secondary | ICD-10-CM

## 2022-02-03 DIAGNOSIS — I70261 Atherosclerosis of native arteries of extremities with gangrene, right leg: Secondary | ICD-10-CM

## 2022-02-03 DIAGNOSIS — Z823 Family history of stroke: Secondary | ICD-10-CM | POA: Diagnosis not present

## 2022-02-03 DIAGNOSIS — E1152 Type 2 diabetes mellitus with diabetic peripheral angiopathy with gangrene: Secondary | ICD-10-CM

## 2022-02-03 DIAGNOSIS — E1122 Type 2 diabetes mellitus with diabetic chronic kidney disease: Secondary | ICD-10-CM | POA: Diagnosis present

## 2022-02-03 DIAGNOSIS — Z7984 Long term (current) use of oral hypoglycemic drugs: Secondary | ICD-10-CM | POA: Diagnosis not present

## 2022-02-03 DIAGNOSIS — I129 Hypertensive chronic kidney disease with stage 1 through stage 4 chronic kidney disease, or unspecified chronic kidney disease: Secondary | ICD-10-CM | POA: Diagnosis not present

## 2022-02-03 DIAGNOSIS — Z79899 Other long term (current) drug therapy: Secondary | ICD-10-CM

## 2022-02-03 DIAGNOSIS — E11621 Type 2 diabetes mellitus with foot ulcer: Secondary | ICD-10-CM | POA: Diagnosis present

## 2022-02-03 DIAGNOSIS — N183 Chronic kidney disease, stage 3 unspecified: Secondary | ICD-10-CM | POA: Diagnosis present

## 2022-02-03 DIAGNOSIS — L97519 Non-pressure chronic ulcer of other part of right foot with unspecified severity: Secondary | ICD-10-CM | POA: Diagnosis not present

## 2022-02-03 DIAGNOSIS — Z8249 Family history of ischemic heart disease and other diseases of the circulatory system: Secondary | ICD-10-CM

## 2022-02-03 DIAGNOSIS — Z825 Family history of asthma and other chronic lower respiratory diseases: Secondary | ICD-10-CM | POA: Diagnosis not present

## 2022-02-03 DIAGNOSIS — Z833 Family history of diabetes mellitus: Secondary | ICD-10-CM | POA: Diagnosis not present

## 2022-02-03 DIAGNOSIS — I70299 Other atherosclerosis of native arteries of extremities, unspecified extremity: Secondary | ICD-10-CM | POA: Diagnosis present

## 2022-02-03 DIAGNOSIS — D62 Acute posthemorrhagic anemia: Secondary | ICD-10-CM | POA: Diagnosis not present

## 2022-02-03 HISTORY — DX: Myoneural disorder, unspecified: G70.9

## 2022-02-03 HISTORY — DX: Pneumonia, unspecified organism: J18.9

## 2022-02-03 HISTORY — DX: Chronic obstructive pulmonary disease, unspecified: J44.9

## 2022-02-03 HISTORY — PX: AMPUTATION TOE: SHX6595

## 2022-02-03 HISTORY — PX: FEMORAL-TIBIAL BYPASS GRAFT: SHX938

## 2022-02-03 HISTORY — DX: Unspecified asthma, uncomplicated: J45.909

## 2022-02-03 HISTORY — DX: Polyneuropathy, unspecified: G62.9

## 2022-02-03 LAB — COMPREHENSIVE METABOLIC PANEL
ALT: 17 U/L (ref 0–44)
AST: 16 U/L (ref 15–41)
Albumin: 3.2 g/dL — ABNORMAL LOW (ref 3.5–5.0)
Alkaline Phosphatase: 67 U/L (ref 38–126)
Anion gap: 11 (ref 5–15)
BUN: 31 mg/dL — ABNORMAL HIGH (ref 8–23)
CO2: 19 mmol/L — ABNORMAL LOW (ref 22–32)
Calcium: 9.4 mg/dL (ref 8.9–10.3)
Chloride: 108 mmol/L (ref 98–111)
Creatinine, Ser: 1.68 mg/dL — ABNORMAL HIGH (ref 0.61–1.24)
GFR, Estimated: 39 mL/min — ABNORMAL LOW (ref >60)
Glucose, Bld: 163 mg/dL — ABNORMAL HIGH (ref 70–99)
Potassium: 3.9 mmol/L (ref 3.5–5.1)
Sodium: 138 mmol/L (ref 135–145)
Total Bilirubin: 0.5 mg/dL (ref 0.3–1.2)
Total Protein: 7.4 g/dL (ref 6.5–8.1)

## 2022-02-03 LAB — URINALYSIS, ROUTINE W REFLEX MICROSCOPIC
Bacteria, UA: NONE SEEN
Bilirubin Urine: NEGATIVE
Glucose, UA: >=500 mg/dL — AB
Hgb urine dipstick: NEGATIVE
Ketones, ur: NEGATIVE mg/dL
Nitrite: NEGATIVE
Protein, ur: NEGATIVE mg/dL
Specific Gravity, Urine: 1.011 (ref 1.005–1.030)
pH: 5 (ref 5.0–8.0)

## 2022-02-03 LAB — APTT: aPTT: 36 seconds (ref 24–36)

## 2022-02-03 LAB — CBC
HCT: 40.1 % (ref 39.0–52.0)
Hemoglobin: 13.6 g/dL (ref 13.0–17.0)
MCH: 24.8 pg — ABNORMAL LOW (ref 26.0–34.0)
MCHC: 33.9 g/dL (ref 30.0–36.0)
MCV: 73 fL — ABNORMAL LOW (ref 80.0–100.0)
Platelets: 348 10*3/uL (ref 150–400)
RBC: 5.49 MIL/uL (ref 4.22–5.81)
RDW: 15 % (ref 11.5–15.5)
WBC: 12.2 10*3/uL — ABNORMAL HIGH (ref 4.0–10.5)
nRBC: 0 % (ref 0.0–0.2)

## 2022-02-03 LAB — TYPE AND SCREEN
ABO/RH(D): O NEG
Antibody Screen: NEGATIVE

## 2022-02-03 LAB — PROTIME-INR
INR: 1.1 (ref 0.8–1.2)
Prothrombin Time: 14.4 seconds (ref 11.4–15.2)

## 2022-02-03 LAB — GLUCOSE, CAPILLARY
Glucose-Capillary: 184 mg/dL — ABNORMAL HIGH (ref 70–99)
Glucose-Capillary: 108 mg/dL — ABNORMAL HIGH (ref 70–99)
Glucose-Capillary: 100 mg/dL — ABNORMAL HIGH (ref 70–99)
Glucose-Capillary: 157 mg/dL — ABNORMAL HIGH (ref 70–99)

## 2022-02-03 LAB — SURGICAL PCR SCREEN
MRSA, PCR: NEGATIVE
Staphylococcus aureus: NEGATIVE

## 2022-02-03 LAB — POCT ACTIVATED CLOTTING TIME: Activated Clotting Time: 303 seconds

## 2022-02-03 SURGERY — CREATION, BYPASS, ARTERIAL, FEMORAL TO TIBIAL, USING GRAFT
Anesthesia: General | Site: Toe | Laterality: Right

## 2022-02-03 MED ORDER — METOPROLOL TARTRATE 5 MG/5ML IV SOLN: 2.0000 mg | INTRAVENOUS | Status: DC | PRN

## 2022-02-03 MED ORDER — HEPARIN 6000 UNIT IRRIGATION SOLUTION
Status: DC | PRN
  Administered 2022-02-03: 1

## 2022-02-03 MED ORDER — EMPAGLIFLOZIN 25 MG PO TABS
25.0000 mg | ORAL_TABLET | Freq: Every day | ORAL | Status: DC
  Administered 2022-02-04 – 2022-02-07 (×4): 25 mg via ORAL
  Filled 2022-02-03 (×5): qty 1

## 2022-02-03 MED ORDER — BISACODYL 5 MG PO TBEC: 5.0000 mg | DELAYED_RELEASE_TABLET | Freq: Every day | ORAL | Status: DC | PRN

## 2022-02-03 MED ORDER — SODIUM CHLORIDE 0.9 % IV SOLN: 500.0000 mL | Freq: Once | INTRAVENOUS | Status: DC | PRN

## 2022-02-03 MED ORDER — 0.9 % SODIUM CHLORIDE (POUR BTL) OPTIME
TOPICAL | Status: DC | PRN
  Administered 2022-02-03: 2000 mL

## 2022-02-03 MED ORDER — HYDROMORPHONE HCL 1 MG/ML IJ SOLN
0.5000 mg | INTRAMUSCULAR | Status: DC | PRN
  Administered 2022-02-03: 0.5 mg via INTRAVENOUS
  Filled 2022-02-03: qty 0.5

## 2022-02-03 MED ORDER — HEPARIN SODIUM (PORCINE) 5000 UNIT/ML IJ SOLN
5000.0000 [IU] | Freq: Three times a day (TID) | INTRAMUSCULAR | Status: DC
  Administered 2022-02-04 – 2022-02-07 (×10): 5000 [IU] via SUBCUTANEOUS
  Filled 2022-02-03 (×10): qty 1

## 2022-02-03 MED ORDER — CHLORHEXIDINE GLUCONATE 0.12 % MT SOLN: 15.0000 mL | Freq: Once | OROMUCOSAL | Status: AC

## 2022-02-03 MED ORDER — SENNOSIDES-DOCUSATE SODIUM 8.6-50 MG PO TABS: 1.0000 | ORAL_TABLET | Freq: Every evening | ORAL | Status: DC | PRN

## 2022-02-03 MED ORDER — PHENOL 1.4 % MT LIQD: 1.0000 | OROMUCOSAL | Status: DC | PRN

## 2022-02-03 MED ORDER — PROPOFOL 10 MG/ML IV BOLUS
INTRAVENOUS | Status: AC
  Filled 2022-02-03: qty 20

## 2022-02-03 MED ORDER — DOCUSATE SODIUM 100 MG PO CAPS
100.0000 mg | ORAL_CAPSULE | Freq: Every day | ORAL | Status: DC
  Administered 2022-02-04 – 2022-02-07 (×4): 100 mg via ORAL
  Filled 2022-02-03 (×4): qty 1

## 2022-02-03 MED ORDER — CEFAZOLIN SODIUM-DEXTROSE 2-4 GM/100ML-% IV SOLN
2.0000 g | INTRAVENOUS | Status: AC
  Administered 2022-02-03: 2 g via INTRAVENOUS
  Filled 2022-02-03: qty 100

## 2022-02-03 MED ORDER — INSULIN ASPART 100 UNIT/ML IJ SOLN
0.0000 [IU] | Freq: Three times a day (TID) | INTRAMUSCULAR | Status: DC
  Administered 2022-02-04: 3 [IU] via SUBCUTANEOUS
  Administered 2022-02-05: 2 [IU] via SUBCUTANEOUS
  Administered 2022-02-05: 3 [IU] via SUBCUTANEOUS
  Administered 2022-02-05: 5 [IU] via SUBCUTANEOUS
  Administered 2022-02-06: 3 [IU] via SUBCUTANEOUS
  Administered 2022-02-06 (×2): 2 [IU] via SUBCUTANEOUS
  Administered 2022-02-07: 3 [IU] via SUBCUTANEOUS

## 2022-02-03 MED ORDER — SODIUM CHLORIDE 0.9 % IV SOLN: INTRAVENOUS | Status: DC

## 2022-02-03 MED ORDER — OXYCODONE-ACETAMINOPHEN 5-325 MG PO TABS
1.0000 | ORAL_TABLET | ORAL | Status: DC | PRN
  Administered 2022-02-03 – 2022-02-04 (×2): 2 via ORAL
  Filled 2022-02-03 (×2): qty 2

## 2022-02-03 MED ORDER — CEFAZOLIN SODIUM-DEXTROSE 2-4 GM/100ML-% IV SOLN
2.0000 g | Freq: Three times a day (TID) | INTRAVENOUS | Status: AC
  Administered 2022-02-03 – 2022-02-04 (×2): 2 g via INTRAVENOUS
  Filled 2022-02-03 (×2): qty 100

## 2022-02-03 MED ORDER — TIOTROPIUM BROMIDE MONOHYDRATE 18 MCG IN CAPS: 18.0000 ug | ORAL_CAPSULE | Freq: Every day | RESPIRATORY_TRACT | Status: DC

## 2022-02-03 MED ORDER — PHENYLEPHRINE HCL-NACL 20-0.9 MG/250ML-% IV SOLN
INTRAVENOUS | Status: DC | PRN
  Administered 2022-02-03: 20 ug/min via INTRAVENOUS

## 2022-02-03 MED ORDER — ONDANSETRON HCL 4 MG/2ML IJ SOLN
INTRAMUSCULAR | Status: DC | PRN
  Administered 2022-02-03: 4 mg via INTRAVENOUS

## 2022-02-03 MED ORDER — CHLORHEXIDINE GLUCONATE CLOTH 2 % EX PADS: 6.0000 | MEDICATED_PAD | Freq: Once | CUTANEOUS | Status: DC

## 2022-02-03 MED ORDER — GABAPENTIN 300 MG PO CAPS
300.0000 mg | ORAL_CAPSULE | Freq: Every day | ORAL | Status: DC
  Administered 2022-02-04 – 2022-02-07 (×4): 300 mg via ORAL
  Filled 2022-02-03 (×5): qty 1

## 2022-02-03 MED ORDER — DEXAMETHASONE SODIUM PHOSPHATE 10 MG/ML IJ SOLN
INTRAMUSCULAR | Status: DC | PRN
  Administered 2022-02-03: 5 mg via INTRAVENOUS

## 2022-02-03 MED ORDER — SUGAMMADEX SODIUM 200 MG/2ML IV SOLN
INTRAVENOUS | Status: DC | PRN
  Administered 2022-02-03: 200 mg via INTRAVENOUS

## 2022-02-03 MED ORDER — ROCURONIUM BROMIDE 10 MG/ML (PF) SYRINGE
PREFILLED_SYRINGE | INTRAVENOUS | Status: DC | PRN
  Administered 2022-02-03: 30 mg via INTRAVENOUS
  Administered 2022-02-03: 50 mg via INTRAVENOUS

## 2022-02-03 MED ORDER — FENTANYL CITRATE (PF) 250 MCG/5ML IJ SOLN
INTRAMUSCULAR | Status: AC
  Filled 2022-02-03: qty 5

## 2022-02-03 MED ORDER — INSULIN ASPART 100 UNIT/ML IJ SOLN
0.0000 [IU] | INTRAMUSCULAR | Status: DC | PRN
  Administered 2022-02-03: 2 [IU] via SUBCUTANEOUS
  Filled 2022-02-03: qty 1

## 2022-02-03 MED ORDER — AMISULPRIDE (ANTIEMETIC) 5 MG/2ML IV SOLN: 10.0000 mg | Freq: Once | INTRAVENOUS | Status: DC | PRN

## 2022-02-03 MED ORDER — FLEET ENEMA 7-19 GM/118ML RE ENEM: 1.0000 | ENEMA | Freq: Once | RECTAL | Status: DC | PRN

## 2022-02-03 MED ORDER — PANTOPRAZOLE SODIUM 40 MG PO TBEC
40.0000 mg | DELAYED_RELEASE_TABLET | Freq: Every day | ORAL | Status: DC
  Administered 2022-02-04 – 2022-02-07 (×4): 40 mg via ORAL
  Filled 2022-02-03 (×4): qty 1

## 2022-02-03 MED ORDER — ACETAMINOPHEN 500 MG PO TABS: 1000.0000 mg | ORAL_TABLET | Freq: Once | ORAL | Status: DC

## 2022-02-03 MED ORDER — HEMOSTATIC AGENTS (NO CHARGE) OPTIME
TOPICAL | Status: DC | PRN
  Administered 2022-02-03: 1 via TOPICAL

## 2022-02-03 MED ORDER — HEPARIN SODIUM (PORCINE) 1000 UNIT/ML IJ SOLN
INTRAMUSCULAR | Status: DC | PRN
  Administered 2022-02-03: 9000 [IU] via INTRAVENOUS

## 2022-02-03 MED ORDER — LIDOCAINE 2% (20 MG/ML) 5 ML SYRINGE
INTRAMUSCULAR | Status: DC | PRN
  Administered 2022-02-03: 60 mg via INTRAVENOUS

## 2022-02-03 MED ORDER — ACETAMINOPHEN 325 MG PO TABS
325.0000 mg | ORAL_TABLET | ORAL | Status: DC | PRN
  Administered 2022-02-04 (×2): 650 mg via ORAL
  Filled 2022-02-03: qty 2

## 2022-02-03 MED ORDER — CHLORHEXIDINE GLUCONATE 0.12 % MT SOLN
OROMUCOSAL | Status: AC
  Administered 2022-02-03: 15 mL via OROMUCOSAL
  Filled 2022-02-03: qty 15

## 2022-02-03 MED ORDER — ALUM & MAG HYDROXIDE-SIMETH 200-200-20 MG/5ML PO SUSP: 15.0000 mL | ORAL | Status: DC | PRN

## 2022-02-03 MED ORDER — FLUTICASONE FUROATE-VILANTEROL 200-25 MCG/ACT IN AEPB
1.0000 | INHALATION_SPRAY | Freq: Every day | RESPIRATORY_TRACT | Status: DC
  Administered 2022-02-04 – 2022-02-07 (×4): 1 via RESPIRATORY_TRACT
  Filled 2022-02-03: qty 28

## 2022-02-03 MED ORDER — ATORVASTATIN CALCIUM 40 MG PO TABS
40.0000 mg | ORAL_TABLET | Freq: Every day | ORAL | Status: DC
  Administered 2022-02-04 – 2022-02-07 (×4): 40 mg via ORAL
  Filled 2022-02-03 (×4): qty 1

## 2022-02-03 MED ORDER — HEMOSTATIC AGENTS (NO CHARGE) OPTIME
TOPICAL | Status: DC | PRN
  Administered 2022-02-03 (×2): 1 via TOPICAL

## 2022-02-03 MED ORDER — LACTATED RINGERS IV SOLN: INTRAVENOUS | Status: DC

## 2022-02-03 MED ORDER — PROTAMINE SULFATE 10 MG/ML IV SOLN
INTRAVENOUS | Status: DC | PRN
  Administered 2022-02-03: 50 mg via INTRAVENOUS

## 2022-02-03 MED ORDER — ASPIRIN 81 MG PO TBEC
81.0000 mg | DELAYED_RELEASE_TABLET | Freq: Every day | ORAL | Status: DC
  Administered 2022-02-04 – 2022-02-07 (×4): 81 mg via ORAL
  Filled 2022-02-03 (×4): qty 1

## 2022-02-03 MED ORDER — HEPARIN 6000 UNIT IRRIGATION SOLUTION
Status: AC
  Filled 2022-02-03: qty 500

## 2022-02-03 MED ORDER — FENTANYL CITRATE (PF) 100 MCG/2ML IJ SOLN
25.0000 ug | INTRAMUSCULAR | Status: DC | PRN
  Administered 2022-02-03 (×2): 25 ug via INTRAVENOUS

## 2022-02-03 MED ORDER — GUAIFENESIN-DM 100-10 MG/5ML PO SYRP: 15.0000 mL | ORAL_SOLUTION | ORAL | Status: DC | PRN

## 2022-02-03 MED ORDER — FENTANYL CITRATE (PF) 100 MCG/2ML IJ SOLN
INTRAMUSCULAR | Status: AC
  Filled 2022-02-03: qty 2

## 2022-02-03 MED ORDER — UMECLIDINIUM BROMIDE 62.5 MCG/ACT IN AEPB
1.0000 | INHALATION_SPRAY | Freq: Every day | RESPIRATORY_TRACT | Status: DC
  Administered 2022-02-04 – 2022-02-07 (×4): 1 via RESPIRATORY_TRACT
  Filled 2022-02-03: qty 7

## 2022-02-03 MED ORDER — ORAL CARE MOUTH RINSE: 15.0000 mL | Freq: Once | OROMUCOSAL | Status: AC

## 2022-02-03 MED ORDER — HYDRALAZINE HCL 20 MG/ML IJ SOLN: 5.0000 mg | INTRAMUSCULAR | Status: DC | PRN

## 2022-02-03 MED ORDER — FENTANYL CITRATE (PF) 250 MCG/5ML IJ SOLN
INTRAMUSCULAR | Status: DC | PRN
  Administered 2022-02-03 (×4): 50 ug via INTRAVENOUS

## 2022-02-03 MED ORDER — ALLOPURINOL 300 MG PO TABS
150.0000 mg | ORAL_TABLET | Freq: Every day | ORAL | Status: DC
  Administered 2022-02-04 – 2022-02-07 (×4): 150 mg via ORAL
  Filled 2022-02-03 (×4): qty 1

## 2022-02-03 MED ORDER — LORATADINE 10 MG PO TABS
10.0000 mg | ORAL_TABLET | ORAL | Status: DC
  Administered 2022-02-04 – 2022-02-07 (×4): 10 mg via ORAL
  Filled 2022-02-03 (×4): qty 1

## 2022-02-03 MED ORDER — LABETALOL HCL 5 MG/ML IV SOLN: 10.0000 mg | INTRAVENOUS | Status: DC | PRN

## 2022-02-03 MED ORDER — PROPOFOL 10 MG/ML IV BOLUS
INTRAVENOUS | Status: DC | PRN
  Administered 2022-02-03: 150 mg via INTRAVENOUS

## 2022-02-03 MED ORDER — ACETAMINOPHEN 650 MG RE SUPP: 325.0000 mg | RECTAL | Status: DC | PRN

## 2022-02-03 MED ORDER — MAGNESIUM SULFATE 2 GM/50ML IV SOLN: 2.0000 g | Freq: Every day | INTRAVENOUS | Status: DC | PRN

## 2022-02-03 MED ORDER — ONDANSETRON HCL 4 MG/2ML IJ SOLN: 4.0000 mg | Freq: Four times a day (QID) | INTRAMUSCULAR | Status: DC | PRN

## 2022-02-03 MED ORDER — GABAPENTIN 300 MG PO CAPS
600.0000 mg | ORAL_CAPSULE | Freq: Every day | ORAL | Status: DC
  Administered 2022-02-03 – 2022-02-06 (×4): 600 mg via ORAL
  Filled 2022-02-03 (×4): qty 2

## 2022-02-03 MED ORDER — POTASSIUM CHLORIDE CRYS ER 20 MEQ PO TBCR: 20.0000 meq | EXTENDED_RELEASE_TABLET | Freq: Every day | ORAL | Status: DC | PRN

## 2022-02-03 SURGICAL SUPPLY — 71 items
BAG COUNTER SPONGE SURGICOUNT (BAG) ×2 IMPLANT
BANDAGE ESMARK 6X9 LF (GAUZE/BANDAGES/DRESSINGS) IMPLANT
BENZOIN TINCTURE PRP APPL 2/3 (GAUZE/BANDAGES/DRESSINGS) ×6 IMPLANT
BLADE SURG 10 STRL SS (BLADE) IMPLANT
BLADE SURG 11 STRL SS (BLADE) IMPLANT
BLADE SURG 15 STRL LF DISP TIS (BLADE) IMPLANT
BLADE SURG 15 STRL SS (BLADE) ×2
BLADE SURG 21 STRL SS (BLADE) IMPLANT
BNDG ELASTIC 4X5.8 VLCR STR LF (GAUZE/BANDAGES/DRESSINGS) IMPLANT
BNDG ESMARK 6X9 LF (GAUZE/BANDAGES/DRESSINGS)
BNDG GAUZE DERMACEA FLUFF 4 (GAUZE/BANDAGES/DRESSINGS) IMPLANT
CANISTER SUCT 3000ML PPV (MISCELLANEOUS) ×2 IMPLANT
CANNULA VESSEL 3MM 2 BLNT TIP (CANNULA) ×2 IMPLANT
CHLORAPREP W/TINT 26 (MISCELLANEOUS) ×4 IMPLANT
CLIP LIGATING EXTRA MED SLVR (CLIP) IMPLANT
CLIP LIGATING EXTRA SM BLUE (MISCELLANEOUS) IMPLANT
CNTNR URN SCR LID CUP LEK RST (MISCELLANEOUS) IMPLANT
CONT SPEC 4OZ STRL OR WHT (MISCELLANEOUS) ×2
CUFF TOURN SGL QUICK 18 NS (TOURNIQUET CUFF) IMPLANT
DRAIN CHANNEL 15F RND FF W/TCR (WOUND CARE) IMPLANT
DRAPE C-ARM 42X72 X-RAY (DRAPES) IMPLANT
DRAPE HALF SHEET 40X57 (DRAPES) IMPLANT
DRAPE X-RAY CASS 24X20 (DRAPES) IMPLANT
DRSG COVADERM 4X6 (GAUZE/BANDAGES/DRESSINGS) IMPLANT
DRSG COVADERM 4X8 (GAUZE/BANDAGES/DRESSINGS) IMPLANT
ELECT REM PT RETURN 9FT ADLT (ELECTROSURGICAL) ×2
ELECTRODE REM PT RTRN 9FT ADLT (ELECTROSURGICAL) ×2 IMPLANT
EVACUATOR SILICONE 100CC (DRAIN) IMPLANT
GAUZE SPONGE 4X4 12PLY STRL (GAUZE/BANDAGES/DRESSINGS) ×2 IMPLANT
GAUZE XEROFORM 5X9 LF (GAUZE/BANDAGES/DRESSINGS) IMPLANT
GLOVE BIO SURGEON STRL SZ8 (GLOVE) ×6 IMPLANT
GLOVE SS BIOGEL STRL SZ 6 (GLOVE) IMPLANT
GOWN STRL REUS W/ TWL LRG LVL3 (GOWN DISPOSABLE) ×4 IMPLANT
GOWN STRL REUS W/ TWL XL LVL3 (GOWN DISPOSABLE) ×2 IMPLANT
GOWN STRL REUS W/TWL LRG LVL3 (GOWN DISPOSABLE) ×4
GOWN STRL REUS W/TWL XL LVL3 (GOWN DISPOSABLE) ×2
GRAFT PROPATEN W/RING 6X80X60 (Vascular Products) IMPLANT
HEAD CUTTING VALVULOTOME LEMTR (VASCULAR PRODUCTS) IMPLANT
HEMOSTAT SNOW SURGICEL 2X4 (HEMOSTASIS) IMPLANT
INSERT FOGARTY SM (MISCELLANEOUS) IMPLANT
KIT BASIN OR (CUSTOM PROCEDURE TRAY) ×2 IMPLANT
KIT TURNOVER KIT B (KITS) ×2 IMPLANT
MARKER GRAFT CORONARY BYPASS (MISCELLANEOUS) IMPLANT
NS IRRIG 1000ML POUR BTL (IV SOLUTION) ×4 IMPLANT
PACK PERIPHERAL VASCULAR (CUSTOM PROCEDURE TRAY) ×2 IMPLANT
PAD ARMBOARD 7.5X6 YLW CONV (MISCELLANEOUS) ×4 IMPLANT
SEALANT PATCH FIBRIN 2X4IN (MISCELLANEOUS) IMPLANT
SET COLLECT BLD 21X3/4 12 (NEEDLE) IMPLANT
SET WALTER ACTIVATION W/DRAPE (SET/KITS/TRAYS/PACK) IMPLANT
STOPCOCK 4 WAY LG BORE MALE ST (IV SETS) IMPLANT
STRIP CLOSURE SKIN 1/2X4 (GAUZE/BANDAGES/DRESSINGS) ×6 IMPLANT
SURGIFLO W/THROMBIN 8M KIT (HEMOSTASIS) IMPLANT
SUT ETHILON 2 0 PSLX (SUTURE) IMPLANT
SUT ETHILON 3 0 PS 1 (SUTURE) IMPLANT
SUT MNCRL AB 4-0 PS2 18 (SUTURE) ×4 IMPLANT
SUT PROLENE 5 0 C 1 24 (SUTURE) ×2 IMPLANT
SUT PROLENE 6 0 BV (SUTURE) ×2 IMPLANT
SUT SILK 2 0 SH (SUTURE) ×2 IMPLANT
SUT SILK 3 0 (SUTURE)
SUT SILK 3-0 18XBRD TIE 12 (SUTURE) IMPLANT
SUT VIC AB 2-0 CT1 27 (SUTURE) ×4
SUT VIC AB 2-0 CT1 TAPERPNT 27 (SUTURE) ×4 IMPLANT
SUT VIC AB 3-0 SH 27 (SUTURE) ×4
SUT VIC AB 3-0 SH 27X BRD (SUTURE) ×4 IMPLANT
TAPE UMBILICAL COTTON 1/8X30 (MISCELLANEOUS) ×2 IMPLANT
TOWEL GREEN STERILE (TOWEL DISPOSABLE) ×2 IMPLANT
TRAY FOLEY MTR SLVR 16FR STAT (SET/KITS/TRAYS/PACK) ×2 IMPLANT
UNDERPAD 30X36 HEAVY ABSORB (UNDERPADS AND DIAPERS) ×2 IMPLANT
VALVULOTOME HEAD CUTTING LEMTR (VASCULAR PRODUCTS) IMPLANT
VALVULOTOME LEMAITRE (VASCULAR PRODUCTS)
WATER STERILE IRR 1000ML POUR (IV SOLUTION) ×2 IMPLANT

## 2022-02-03 NOTE — Progress Notes (Signed)
Pt arrived from ..PACU..., A/ox .4..pt denies any pain, MD aware,CCMD called. CHG bath given,no further needs at this time   

## 2022-02-03 NOTE — Interval H&P Note (Signed)
History and Physical Interval Note:  02/03/2022 9:36 AM  [name]  has presented today for surgery, with the diagnosis of Atherosclerosis of native arteries of right leg with ulceration of other part of foot.  The various methods of treatment have been discussed with the patient and family. After consideration of risks, benefits and other options for treatment, the patient has consented to  Procedure(s): RIGHT BYPASS GRAFT COMMON FEMORAL-POSTERIOR TIBIAL ARTERY WITH PTFE (Right) as a surgical intervention.  The patient's history has been reviewed, patient examined, no change in status, stable for surgery.  I have reviewed the patient's chart and labs.  Questions were answered to the patient's satisfaction.     Cherre Robins

## 2022-02-03 NOTE — Discharge Instructions (Addendum)
[facility] of [facility]  Discharge instructions  Lower Extremity Bypass Surgery  Please refer to the following instruction for your post-procedure care. Your surgeon or physician assistant will discuss any changes with you.  Activity  You are encouraged to walk as much as you can. You can slowly return to normal activities during the month after your surgery. Avoid strenuous activity and heavy lifting until your doctor tells you it's OK. Avoid activities such as vacuuming or swinging a golf club. Do not drive until your doctor give the OK and you are no longer taking prescription pain medications. It is also normal to have difficulty with sleep habits, eating and bowel movement after surgery. These will go away with time. Continue non weight bearing on the right foot to help promote healing of wounds.   Bathing/Showering  Shower daily after you go home. Do not soak in a bathtub, hot tub, or swim until the incision heals completely.  Incision Care  Clean your incision with mild soap and water. Shower every day. Pat the area dry with a clean towel. You do not need a bandage unless otherwise instructed. Do not apply any ointments or creams to your incision. If you have open wounds you will be instructed how to care for them or a visiting nurse may be arranged for you. If you have staples or sutures along your incision they will be removed at your post-op appointment. You may have skin glue on your incision. Do not peel it off. It will come off on its own in about one week.  Wash the groin wound with soap and water daily and pat dry. (No tub bath-only shower)  Then put a dry gauze or washcloth in the groin to keep this area dry to help prevent wound infection.  Do this daily and as needed.  Do not use Vaseline or neosporin on your incisions.  Only use soap and water on your incisions and then protect and keep dry.  Okay to clean right foot incision with soap and water daily  and then dry and place dry dressing back on foot with gauze, kerlix wrap and ace wrap.  Do not wrap ace wrap too tight - it is only to use to keep dressing in place.   Diet  Resume your normal diet. There are no special food restrictions following this procedure. A low fat/ low cholesterol diet is recommended for all patients with vascular disease. In order to heal from your surgery, it is CRITICAL to get adequate nutrition. Your body requires vitamins, minerals, and protein. Vegetables are the best source of vitamins and minerals. Vegetables also provide the perfect balance of protein. Processed food has little nutritional value, so try to avoid this.  Medications  Resume taking all your medications unless your doctor or physician assistant tells you not to. If your incision is causing pain, you may take over-the-counter pain relievers such as acetaminophen (Tylenol). If you were prescribed a stronger pain medication, please aware these medication can cause nausea and constipation. Prevent nausea by taking the medication with a snack or meal. Avoid constipation by drinking plenty of fluids and eating foods with high amount of fiber, such as fruits, vegetables, and grains. Take Colace 100 mg (an over-the-counter stool softener) twice a day as needed for constipation.  Do not take Tylenol if you are taking prescription pain medications.  Follow Up  Our office will schedule a follow up appointment 2-3 weeks following discharge.  Please call us  immediately for any of the following conditions  Severe or worsening pain in your legs or feet while at rest or while walking Increase pain, redness, warmth, or drainage (pus) from your incision site(s) Fever of 101 degree or higher The swelling in your leg with the bypass suddenly worsens and becomes more painful than when you were in the hospital If you have been instructed to feel your graft pulse then you should do so every day. If you can no longer feel  this pulse, call the office immediately. Not all patients are given this instruction.  Leg swelling is common after leg bypass surgery.  The swelling should improve over a few months following surgery. To improve the swelling, you may elevate your legs above the level of your heart while you are sitting or resting. Your surgeon or physician assistant may ask you to apply an ACE wrap or wear compression (TED) stockings to help to reduce swelling.  Reduce your risk of vascular disease  Stop smoking. If you would like help call [facility] at 1-800-QUIT-NOW 972-007-2418) or [place] at (725)452-4322.  Manage your cholesterol Maintain a desired weight Control your diabetes weight Control your diabetes Keep your blood pressure down  If you have any questions, please call the office at [phone]   Information on my medicine - ELIQUIS (apixaban)  Why was Eliquis prescribed for you? Eliquis was prescribed for you to reduce the risk of a blood clots forming after vascular surgery.  What do You need to know about Eliquis ? Take your Eliquis TWICE DAILY - one tablet in the morning and one tablet in the evening with or without food.  It would be best to take the doses about the same time each day.  If you have difficulty swallowing the tablet whole please discuss with your pharmacist how to take the medication safely.  Take Eliquis exactly as prescribed by your doctor and DO NOT stop taking Eliquis without talking to the doctor who prescribed the medication.  Stopping may increase your risk of developing a new clot or stroke.  Refill your prescription before you run out.  After discharge, you should have regular check-up appointments with your healthcare provider that is prescribing your Eliquis.  In the future your dose may need to be changed if your kidney function or weight changes by a significant amount or as you get older.  What do you do if you miss a dose? If you miss a  dose, take it as soon as you remember on the same day and resume taking twice daily.  Do not take more than one dose of ELIQUIS at the same time.  Important Safety Information A possible side effect of Eliquis is bleeding. You should call your healthcare provider right away if you experience any of the following: Bleeding from an injury or your nose that does not stop. Unusual colored urine (red or dark brown) or unusual colored stools (red or black). Unusual bruising for unknown reasons. A serious fall or if you hit your head (even if there is no bleeding).  Some medicines may interact with Eliquis and might increase your risk of bleeding or clotting while on Eliquis. To help avoid this, consult your healthcare provider or pharmacist prior to using any new prescription or non-prescription medications, including herbals, vitamins, non-steroidal anti-inflammatory drugs (NSAIDs) and supplements.  This website has more information on Eliquis (apixaban): [email or web address].no.

## 2022-02-03 NOTE — Anesthesia Procedure Notes (Signed)
Arterial Line Insertion Start/End11/30/2023 9:30 AM, 02/03/2022 9:35 AM Performed by: Amadeo Garnet, CRNA, CRNA  Patient location: Pre-op. Preanesthetic checklist: patient identified, IV checked, site marked, risks and benefits discussed, surgical consent, monitors and equipment checked, pre-op evaluation, timeout performed and anesthesia consent Lidocaine 1% used for infiltration Left, radial was placed Hand hygiene performed  and maximum sterile barriers used   Attempts: 1 Procedure performed without using ultrasound guided technique. Following insertion, dressing applied and Biopatch. Post procedure assessment: normal  Patient tolerated the procedure well with no immediate complications.

## 2022-02-03 NOTE — Plan of Care (Signed)
  Problem: Activity: Goal: Ability to return to baseline activity level will improve Outcome: Progressing   Problem: Cardiovascular: Goal: Ability to achieve and maintain adequate cardiovascular perfusion will improve Outcome: Progressing   Problem: Coping: Goal: Ability to adjust to condition or change in health will improve Outcome: Progressing   Problem: Health Behavior/Discharge Planning: Goal: Ability to manage health-related needs will improve Outcome: Progressing   Problem: Skin Integrity: Goal: Risk for impaired skin integrity will decrease Outcome: Progressing

## 2022-02-03 NOTE — Anesthesia Postprocedure Evaluation (Signed)
Anesthesia Post Note  Patient: [name]  Procedure(s) Performed: RIGHT COMMON FEMORAL TO PEDAL ARTERY BYPASS WITH 6CM POLYTETRAFLUOROETHYLENE GRAFT (Right: Leg Upper) RIGHT THIRD TOE AMPUTATION (Right: Toe)     Patient location during evaluation: PACU Anesthesia Type: General Level of consciousness: awake and alert Pain management: pain level controlled Vital Signs Assessment: post-procedure vital signs reviewed and stable Respiratory status: spontaneous breathing, nonlabored ventilation, respiratory function stable and patient connected to nasal cannula oxygen Cardiovascular status: blood pressure returned to baseline and stable Postop Assessment: no apparent nausea or vomiting Anesthetic complications: no  No notable events documented.  Last Vitals:  Vitals:   02/03/22 1315 02/03/22 1330  BP: 121/83 139/81  Pulse: 98 89  Resp: 18 15  Temp:    SpO2: 93% 92%    Last Pain:  Vitals:   02/03/22 1330  PainSc: Asleep        RLE Motor Response: Responds to commands (02/03/22 1330) RLE Sensation: Full sensation (02/03/22 1330)      Florance Paolillo S

## 2022-02-03 NOTE — Progress Notes (Signed)
Upper/lower dentures and bilateral hearing aids sent to PACU in labeled containers.

## 2022-02-03 NOTE — Transfer of Care (Signed)
Immediate Anesthesia Transfer of Care Note  Patient: Reiss Mowrey  Procedure(s) Performed: RIGHT COMMON FEMORAL TO PEDAL ARTERY BYPASS WITH 6CM POLYTETRAFLUOROETHYLENE GRAFT (Right: Leg Upper) RIGHT THIRD TOE AMPUTATION (Right: Toe)  Patient Location: PACU  Anesthesia Type:General  Level of Consciousness: awake, alert , and oriented  Airway & Oxygen Therapy: Patient Spontanous Breathing  Post-op Assessment: Report given to RN, Post -op Vital signs reviewed and stable, and Patient moving all extremities  Post vital signs: Reviewed and stable  Last Vitals:  Vitals Value Taken Time  BP 119/79 02/03/22 1256  Temp    Pulse 96 02/03/22 1302  Resp 19 02/03/22 1302  SpO2 92 % 02/03/22 1302  Vitals shown include unvalidated device data.  Last Pain:  Vitals:   02/03/22 0804  PainSc: 2       Patients Stated Pain Goal: 0 (50/15/86 8257)  Complications: No notable events documented.

## 2022-02-03 NOTE — Progress Notes (Signed)
  Progress Note    02/03/2022 3:58 PM Day of Surgery  Subjective:  no complaints. Minimal pain. Just arrived to floor   Vitals:   02/03/22 1515 02/03/22 1545  BP: 120/88 115/77  Pulse: 78 78  Resp: 11 13  Temp: 98 F (36.7 C) (!) 97.3 F (36.3 C)  SpO2: 94% 96%   Physical Exam: Cardiac:  regular Lungs:  non labored Incisions:  right groin, right below knee dressings clean, dry and intact. Right foot dressings c/d/i Extremities:  RLE well perfused and warm. Palpable pulse in bypass graft in distal medial right leg Neurologic: alert and oriented  CBC    Component Value Date/Time   WBC 12.2 (H) 02/03/2022 0831   RBC 5.49 02/03/2022 0831   HGB 13.6 02/03/2022 0831   HGB 14.0 06/17/2020 1648   HGB 13.6 11/06/2015 1013   HCT 40.1 02/03/2022 0831   HCT 44.1 06/17/2020 1648   HCT 38.9 11/06/2015 1013   PLT 348 02/03/2022 0831   PLT 196 06/17/2020 1648   MCV 73.0 (L) 02/03/2022 0831   MCV 81 06/17/2020 1648   MCV 76.4 (L) 11/06/2015 1013   MCH 24.8 (L) 02/03/2022 0831   MCHC 33.9 02/03/2022 0831   RDW 15.0 02/03/2022 0831   RDW 16.1 (H) 06/17/2020 1648   RDW 15.3 (H) 11/06/2015 1013   LYMPHSABS 1.0 07/02/2021 0203   LYMPHSABS 3.5 (H) 11/06/2015 1013   MONOABS 0.5 07/02/2021 0203   MONOABS 0.9 11/06/2015 1013   EOSABS 0.0 07/02/2021 0203   EOSABS 0.6 (H) 11/06/2015 1013   BASOSABS 0.0 07/02/2021 0203   BASOSABS 0.0 11/06/2015 1013    BMET    Component Value Date/Time   NA 138 02/03/2022 0831   NA 140 11/29/2021 1624   NA 138 11/06/2015 1013   K 3.9 02/03/2022 0831   K 4.2 11/06/2015 1013   CL 108 02/03/2022 0831   CO2 19 (L) 02/03/2022 0831   CO2 19 (L) 11/06/2015 1013   GLUCOSE 163 (H) 02/03/2022 0831   GLUCOSE 116 11/06/2015 1013   BUN 31 (H) 02/03/2022 0831   BUN 33 (H) 11/29/2021 1624   BUN 23.5 11/06/2015 1013   CREATININE 1.68 (H) 02/03/2022 0831   CREATININE 1.3 11/06/2015 1013   CALCIUM 9.4 02/03/2022 0831   CALCIUM 9.4 11/06/2015 1013    GFRNONAA 39 (L) 02/03/2022 0831   GFRNONAA 54 (L) 09/23/2015 1037   GFRAA 49 (L) 09/17/2018 1406   GFRAA 62 09/23/2015 1037    INR    Component Value Date/Time   INR 1.1 02/03/2022 0831     Intake/Output Summary (Last 24 hours) at 02/03/2022 1558 Last data filed at 02/03/2022 1253 Gross per 24 hour  Intake 1000 ml  Output 400 ml  Net 600 ml     Assessment/Plan:  88 y.o. male is s/p R CF to distal posterior tibial artery bypass with PTFE and 3rd toe amputation Day of Surgery   Doing well post op Pain well managed Palpable pulse in bypass graft Dressings are clean, dry and intact Non weight bearing RLE Routine post op care  Karoline Caldwell, PA-C [facility] [phone] 02/03/2022 3:58 PM

## 2022-02-03 NOTE — Op Note (Signed)
DATE OF SERVICE: 02/03/2022  PATIENT:  [name]  88 y.o. male  PRE-OPERATIVE DIAGNOSIS:  atherosclerosis of native arteries of right lower extremity causing gangrene  POST-OPERATIVE DIAGNOSIS:  Same  PROCEDURE:   1) right common femoral - plantar posterior tibial artery bypass in subcutaneous tunnel (57mm ePTFE) 2) right third toe amputation  SURGEON:  Surgeon(s) and Role:    * Cherre Robins, MD - Primary  ASSISTANT: Paulo Fruit, PA-C  An experienced assistant was required given the complexity of this procedure and the standard of surgical care. My assistant helped with exposure through counter tension, suctioning, ligation and retraction to better visualize the surgical field.  My assistant expedited sewing during the case by following my sutures. Wherever I use the term "we" in the report, my assistant actively helped me with that portion of the procedure.  ANESTHESIA:   general  EBL: <55mL  BLOOD ADMINISTERED:none  DRAINS: none   LOCAL MEDICATIONS USED:  NONE  SPECIMEN:  right third toe for culture / gram stain  COUNTS: confirmed correct.  TOURNIQUET:    Total Tourniquet Time Documented: Calf (Right) - 14 minutes Total: Calf (Right) - 14 minutes   PATIENT DISPOSITION:  PACU - hemodynamically stable.   Delay start of Pharmacological VTE agent (>24hrs) due to surgical blood loss or risk of bleeding: no  INDICATION FOR PROCEDURE: [name] is a 88 y.o. male with right foot gangrenous change with angiographic evidence of severe peripheral arterial disease. After careful discussion of risks, benefits, and alternatives the patient was offered left femoral-posterior tibial bypass. The patient understood and wished to proceed.  OPERATIVE FINDINGS: common femoral artery healthy. Posterior tibial artery at the foot healthy. Two pieces of 23mm ePTFE sewn together in counter incision in proximal calf to create enough conduit to reach the foot. Good technical  result from bypass. Nearly triphasic flow heard in the posterior tibial artery at completion. Unremarkable third toe amputation.  DESCRIPTION OF PROCEDURE: After identification of the patient in the pre-operative holding area, the patient was transferred to the operating room. The patient was positioned supine on the operating room table. Anesthesia was induced. The right leg was prepped and draped in standard fashion. A surgical pause was performed confirming correct patient, procedure, and operative location.  Intraoperative was used throughout the course of the right common femoral artery, and right posterior tibial artery. The skin overlying the 2 arteries was marked with marker.  An oblique incision was made in the right groin.  This was carried down through subcutaneous tissue until the femoral sheath was encountered.  The femoral sheath was entered carefully.  The common femoral artery was skeletonized for several centimeters.  Identified the takeoff of the profunda femoris artery.  Common femoral artery was encircled with Silastic Vesseloops proximally and distally.  Posterior tibial artery at the foot was exposed through a longitudinal incision below the ankle.  Incision was carried down through subtenons tissue and retinaculum until the posterior tibial vascular bundle was identified.  The posterior tibial artery was skeletonized for several centimeters.  A counterincision was made in the proximal calf to allow connection of two 6 mm x 80 cm PTFE vascular graft.  A Kelly-Wick tunneler was used to connect the posterior tibial exposure to the counterincision and the counterincision into the common femoral artery exposure.  The patient was systemically heparinized.  Activated clotting measurements were used at the case to confirm adequate anticoagulation.  Clamps were applied to the common femoral artery proximally distally.  A longitudinal arteriotomy was made with 11 blade and widened with  Potts scissors.  The proximal aspect of the vascular graft was specially to allow end-to-side anastomosis to the common femoral artery.  Common femoral anastomosis was performed using continuous running suture of 6-0 Prolene.  After completion the anastomosis the anastomosis was flushed down the open end of the vascular graft.  Excellent flow was confirmed.  Clamps were applied to the vascular graft.  The 2 vascular graft were cut to size to meet in the counterincision.  Here they were sewn end-to-end using continuous running suture of 6-0 Prolene.  After completing the anastomosis clamp was released on the vascular graft and reasonable hemostasis was achieved with some suture hole bleeding noted.  Clamp was reapplied.  Attention was turned to the posterior tibial artery.  The distal end of the vascular graft was spatulated to allow end-to-side anastomosis to the posterior tibial artery.  The pneumatic tourniquet was applied to the calf.  The leg was exsanguinated with an Esmarch tourniquet.  The pneumatic tourniquet was inflated.  An anterior arteriotomy was made on the posterior tibial artery with an 11 blade.  This was lengthened with a small Potts scissor.  The distal vascular graft was then anastomosed end to side to the posterior tibial artery using continuous running suture of 6-0 Prolene.  Immediately prior to completion the anastomosis was flushed and de-aired.  The pneumatic tourniquet was released.  Clamp was released on the vascular graft.  Excellent, nearly triphasic flow was heard in the distal posterior tibial artery.  This was graft dependent.  Satisfied we ended the case here  Heparin was reversed with protamine.  Hemostasis was achieved in the anastomoses and surgical beds.  The groin incision was closed in layers using 2-0 Vicryl, 3-0 Vicryl, 4-0 Monocryl.  The counterincision was closed in layers using 3-0 Vicryl and 4-0 Monocryl.  The pedal exposure was closed in layers using 3-0 Vicryl  and 3-0 nylon mattress suture.   Attention was then turned to the foot.  The skin about the third toe was incised with a 21 blade and carried down to the bone. Bleeding was noted at the distal foot.  The toe was amputated at the metatarsal phalangeal joint.  I then debrided the metatarsal back for about a centimeter to allow better soft tissue coverage.  The wound was closed using 2-0 nylon mattress suture after hemostasis was achieved.  A bulky dressing was applied to the foot.  Sterile bandages were applied to the leg incisions.  Upon completion of the case instrument and sharps counts were confirmed correct. The patient was transferred to the PACU in good condition. I was present for all portions of the procedure.  Yevonne Aline. Stanford Breed, MD [facility] Phone Number: [phone] 02/03/2022 12:41 PM

## 2022-02-03 NOTE — Anesthesia Preprocedure Evaluation (Signed)
Anesthesia Evaluation  Patient identified by MRN, date of birth, ID band Patient awake    Reviewed: Allergy & Precautions, NPO status , Patient's Chart, lab work & pertinent test results  Airway Mallampati: II  TM Distance: >3 FB Neck ROM: Full    Dental  (+) Dental Advisory Given   Pulmonary asthma , COPD, former smoker   breath sounds clear to auscultation       Cardiovascular hypertension, Pt. on medications + Peripheral Vascular Disease   Rhythm:Regular Rate:Normal     Neuro/Psych  Neuromuscular disease    GI/Hepatic negative GI ROS, Neg liver ROS,,,  Endo/Other  diabetes, Type 2    Renal/GU Renal disease     Musculoskeletal  (+) Arthritis ,    Abdominal   Peds  Hematology  (+) Blood dyscrasia, anemia   Anesthesia Other Findings   Reproductive/Obstetrics                              Anesthesia Physical Anesthesia Plan  ASA: 3  Anesthesia Plan: General   Post-op Pain Management: Tylenol PO (pre-op)* and Minimal or no pain anticipated   Induction: Intravenous  PONV Risk Score and Plan: 2 and Dexamethasone, Ondansetron and Treatment may vary due to age or medical condition  Airway Management Planned: Oral ETT  Additional Equipment: Arterial line  Intra-op Plan:   Post-operative Plan: Extubation in OR  Informed Consent: I have reviewed the patients History and Physical, chart, labs and discussed the procedure including the risks, benefits and alternatives for the proposed anesthesia with the patient or authorized representative who has indicated his/her understanding and acceptance.     Dental advisory given  Plan Discussed with: CRNA  Anesthesia Plan Comments:          Anesthesia Quick Evaluation

## 2022-02-03 NOTE — Anesthesia Procedure Notes (Signed)
Procedure Name: Intubation Date/Time: 02/03/2022 10:11 AM  Performed by: Amadeo Garnet, CRNAPre-anesthesia Checklist: Patient identified, Emergency Drugs available, Suction available and Patient being monitored Patient Re-evaluated:Patient Re-evaluated prior to induction Oxygen Delivery Method: Circle system utilized Preoxygenation: Pre-oxygenation with 100% oxygen Induction Type: IV induction Ventilation: Mask ventilation without difficulty and Oral airway inserted - appropriate to patient size Laryngoscope Size: Mac and 4 Grade View: Grade I Tube type: Oral Tube size: 7.5 mm Number of attempts: 1 Airway Equipment and Method: Stylet and Oral airway Placement Confirmation: ETT inserted through vocal cords under direct vision, positive ETCO2 and breath sounds checked- equal and bilateral Secured at: 23 cm Tube secured with: Tape Dental Injury: Teeth and Oropharynx as per pre-operative assessment

## 2022-02-04 ENCOUNTER — Encounter (HOSPITAL_COMMUNITY): Payer: Self-pay | Admitting: Vascular Surgery

## 2022-02-04 LAB — GLUCOSE, CAPILLARY
Glucose-Capillary: 154 mg/dL — ABNORMAL HIGH (ref 70–99)
Glucose-Capillary: 138 mg/dL — ABNORMAL HIGH (ref 70–99)
Glucose-Capillary: 142 mg/dL — ABNORMAL HIGH (ref 70–99)
Glucose-Capillary: 174 mg/dL — ABNORMAL HIGH (ref 70–99)

## 2022-02-04 LAB — BASIC METABOLIC PANEL
Anion gap: 7 (ref 5–15)
BUN: 32 mg/dL — ABNORMAL HIGH (ref 8–23)
CO2: 20 mmol/L — ABNORMAL LOW (ref 22–32)
Calcium: 8.5 mg/dL — ABNORMAL LOW (ref 8.9–10.3)
Chloride: 108 mmol/L (ref 98–111)
Creatinine, Ser: 2.06 mg/dL — ABNORMAL HIGH (ref 0.61–1.24)
GFR, Estimated: 31 mL/min — ABNORMAL LOW (ref >60)
Glucose, Bld: 154 mg/dL — ABNORMAL HIGH (ref 70–99)
Potassium: 4.1 mmol/L (ref 3.5–5.1)
Sodium: 135 mmol/L (ref 135–145)

## 2022-02-04 LAB — LIPID PANEL
Cholesterol: 95 mg/dL (ref 0–200)
HDL: 23 mg/dL — ABNORMAL LOW (ref >40)
LDL Cholesterol: 48 mg/dL (ref 0–99)
Total CHOL/HDL Ratio: 4.1 RATIO
Triglycerides: 120 mg/dL (ref <150)
VLDL: 24 mg/dL (ref 0–40)

## 2022-02-04 LAB — CBC
HCT: 32.5 % — ABNORMAL LOW (ref 39.0–52.0)
Hemoglobin: 11.4 g/dL — ABNORMAL LOW (ref 13.0–17.0)
MCH: 25.1 pg — ABNORMAL LOW (ref 26.0–34.0)
MCHC: 35.1 g/dL (ref 30.0–36.0)
MCV: 71.4 fL — ABNORMAL LOW (ref 80.0–100.0)
Platelets: 291 10*3/uL (ref 150–400)
RBC: 4.55 MIL/uL (ref 4.22–5.81)
RDW: 15 % (ref 11.5–15.5)
WBC: 13.1 10*3/uL — ABNORMAL HIGH (ref 4.0–10.5)
nRBC: 0 % (ref 0.0–0.2)

## 2022-02-04 NOTE — Evaluation (Signed)
Occupational Therapy Evaluation Patient Details Name: [name] MRN: [id number] DOB: October 14, 1933 Today's Date: 02/04/2022   History of Present Illness Pt is a 88 y/o male presenting on 11/30 with R foot ulceration. 11/30 s/p R CF to distal posterior tibial artery bypass to PTFE and 3rd toe amputation. PMH includes: anemia, cataracts, DM, HTN, gout.   Clinical Impression   PTA patient reports independent with ADLs and mobility. Admitted for above and presents with problem list below, including NWB R LE, pain, impaired balance, generalized weakness, decreased activity tolerance and decreased safety awareness/insight to deficits.  Patient currently requires max assist +2 for LB ADLs and min to setup assist for seated ADLs, supervision for bed mobility and min-mod assist +2 for transfers using RW.  He reports he has good support of daughter at home, reports she will be able to physically assist at dc.  Discussed safety, recommendations for dc home. Based on performance today, believe pt will benefit from continued OT services acutely and after dc at [facility] (pending progress) to optimize independence, safety and return to PLOF.  If pt does not progress or family cannot provide needed physical assist, may need to pursue SNF rehab.  Will follow.      Recommendations for follow up therapy are one component of a multi-disciplinary discharge planning process, led by the attending physician.  Recommendations may be updated based on patient status, additional functional criteria and insurance authorization.   Follow Up Recommendations  Home health OT     Assistance Recommended at Discharge Frequent or constant Supervision/Assistance  Patient can return home with the following A lot of help with walking and/or transfers;A lot of help with bathing/dressing/bathroom;Assistance with cooking/housework;Assist for transportation;Help with stairs or ramp for entrance    Functional Status Assessment  Patient  has had a recent decline in their functional status and demonstrates the ability to make significant improvements in function in a reasonable and predictable amount of time.  Equipment Recommendations  BSC/3in1;Wheelchair (measurements OT);Wheelchair cushion (measurements OT) (drop arm BSC)    Recommendations for Other Services       Precautions / Restrictions Precautions Precautions: Fall Restrictions Weight Bearing Restrictions: Yes RLE Weight Bearing: Non weight bearing (confirmed no heel weight bearing either)      Mobility Bed Mobility Overal bed mobility: Needs Assistance Bed Mobility: Supine to Sit     Supine to sit: Supervision, HOB elevated     General bed mobility comments: Extra time to scoot hips to edge of bed but able to complete with supervision only for safety, HOB elevated.    Transfers Overall transfer level: Needs assistance Equipment used: Rolling walker (2 wheels) Transfers: Bed to chair/wheelchair/BSC, Sit to/from Stand Sit to Stand: Mod assist, +2 physical assistance, +2 safety/equipment, From elevated surface     Step pivot transfers: Min assist, +2 safety/equipment     General transfer comment: x2 sit to stand reps from elevated EOB, cuing pt to keep R foot off ground but needing foot of therapist to completely keep foot off ground during transfer. Repeated cues to push up from bed, needing heavy modAx2 to power up to stand. Once standing, pt able to hop to L bed > recliner using RW while keeping R foot off ground on his own with only minA for stability, +2 for safety. Cues to kick R leg out and reach back for chair to sit.      Balance Overall balance assessment: Needs assistance Sitting-balance support: No upper extremity supported, Feet supported Sitting balance-Leahy Scale:  Good Sitting balance - Comments: Limited slightly by R groin pain, but unable to don sock but able to reach to floor to pick up rag with supervision.   Standing balance  support: Bilateral upper extremity supported, During functional activity, Reliant on assistive device for balance Standing balance-Leahy Scale: Poor Standing balance comment: Reliant on RW and external physical assistance                           ADL either performed or assessed with clinical judgement   ADL Overall ADL's : Needs assistance/impaired     Grooming: Set up;Sitting           Upper Body Dressing : Set up;Sitting   Lower Body Dressing: Maximal assistance;+2 for physical assistance;+2 for safety/equipment;Sit to/from stand Lower Body Dressing Details (indicate cue type and reason): assist with sock on L foot (R foot in post op bandage), pt requires mod assist +2 to stand and relies on BUE support Toilet Transfer: Moderate assistance;+2 for safety/equipment;+2 for physical assistance;Minimal assistance;Rolling walker (2 wheels);Stand-pivot;Cueing for sequencing;Cueing for safety Toilet Transfer Details (indicate cue type and reason): to recliner Toileting- Clothing Manipulation and Hygiene: Set up;Sitting/lateral lean Toileting - Clothing Manipulation Details (indicate cue type and reason): urinal at EOB     Functional mobility during ADLs: Rolling walker (2 wheels);Minimal assistance;+2 for physical assistance;+2 for safety/equipment       Vision   Vision Assessment?: No apparent visual deficits     Perception     Praxis      Pertinent Vitals/Pain Pain Assessment Pain Assessment: Faces Faces Pain Scale: Hurts little more Pain Location: R foot, R groin Pain Descriptors / Indicators: Discomfort, Grimacing, Operative site guarding Pain Intervention(s): Limited activity within patient's tolerance, Monitored during session, Repositioned     Hand Dominance Right   Extremity/Trunk Assessment Upper Extremity Assessment Upper Extremity Assessment: Overall WFL for tasks assessed   Lower Extremity Assessment Lower Extremity Assessment: Defer to PT  evaluation RLE Deficits / Details: s/p 3rd toe amputation; ACE bandage around foot and incision noted along medial knee/lower leg and groin access site; reports hx of peripheral neuropathy bil; able to lift against gravity; limited in ankle dorsiflexion AROM due to pain and bandages RLE Sensation: history of peripheral neuropathy LLE Deficits / Details: reports hx of peripheral neuropathy bil; able to lift against gravity LLE Sensation: history of peripheral neuropathy   Cervical / Trunk Assessment Cervical / Trunk Assessment: Normal   Communication Communication Communication: HOH (hearing aids)   Cognition Arousal/Alertness: Awake/alert Behavior During Therapy: WFL for tasks assessed/performed Overall Cognitive Status: Within Functional Limits for tasks assessed                                 General Comments: Pt with potentially poor insight into the reality of how much assistance he needs and deferring to completely answer whether this is possible at home     General Comments  VSS on RA    Exercises     Shoulder Instructions      Home Living Family/patient expects to be discharged to:: Private residence Living Arrangements: Children (daughter) Available Help at Discharge: Family;Available 24 hours/day Type of Home: House Home Access: Stairs to enter [employer] of Steps: 2 Entrance Stairs-Rails:  (post) Home Layout: One level     Bathroom Shower/Tub: Teacher, early years/pre: Standard Bathroom Accessibility: Yes   Home  Equipment: Conservation officer, nature (2 wheels);Cane - single point          Prior Functioning/Environment Prior Level of Function : Independent/Modified Independent;Driving             Mobility Comments: Does not use AD. Reports no fall in past 6 months but 1x before that ADLs Comments: independent ADLs        OT Problem List: Decreased strength;Decreased activity tolerance;Impaired balance (sitting and/or  standing);Decreased safety awareness;Decreased knowledge of use of DME or AE;Decreased knowledge of precautions;Pain      OT Treatment/Interventions: Self-care/ADL training;Therapeutic exercise;DME and/or AE instruction;Therapeutic activities;Patient/family education;Balance training    OT Goals(Current goals can be found in the care plan section) Acute Rehab OT Goals Patient Stated Goal: home OT Goal Formulation: With patient Time For Goal Achievement: 02/18/22 Potential to Achieve Goals: Fair  OT Frequency: Min 2X/week    Co-evaluation PT/OT/SLP Co-Evaluation/Treatment: Yes Reason for Co-Treatment: For patient/therapist safety PT goals addressed during session: Mobility/safety with mobility;Balance;Proper use of DME OT goals addressed during session: ADL's and self-care      AM-PAC OT "6 Clicks" Daily Activity     Outcome Measure Help from another person eating meals?: None Help from another person taking care of personal grooming?: A Little Help from another person toileting, which includes using toliet, bedpan, or urinal?: A Lot Help from another person bathing (including washing, rinsing, drying)?: A Lot Help from another person to put on and taking off regular upper body clothing?: A Little Help from another person to put on and taking off regular lower body clothing?: A Lot 6 Click Score: 16   End of Session Equipment Utilized During Treatment: Gait belt;Rolling walker (2 wheels) Nurse Communication: Mobility status;Precautions  Activity Tolerance: Patient tolerated treatment well Patient left: in chair;with call bell/phone within reach;with chair alarm set  OT Visit Diagnosis: Other abnormalities of gait and mobility (R26.89);Muscle weakness (generalized) (M62.81);Pain Pain - Right/Left: Right Pain - part of body: Ankle and joints of foot (groin)                Time: 1771-1657 OT Time Calculation (min): 20 min Charges:  OT General Charges $OT Visit: 1 Visit OT  Evaluation $OT Eval Moderate Complexity: [place], OT Acute Rehabilitation Services Office [phone]   Delight Stare 02/04/2022, 10:38 AM

## 2022-02-04 NOTE — Progress Notes (Signed)
PHARMACIST LIPID MONITORING   [name] is a 88 y.o. male admitted on 02/03/2022 with RLE atherosclerosis s/p bypass.  Pharmacy has been consulted to optimize lipid-lowering therapy with the indication of secondary prevention for clinical ASCVD.  Recent Labs:  Lipid Panel (last 6 months):   Lab Results  Component Value Date   CHOL 95 02/04/2022   TRIG 120 02/04/2022   HDL 23 (L) 02/04/2022   CHOLHDL 4.1 02/04/2022   VLDL 24 02/04/2022   LDLCALC NOT CALCULATED 02/04/2022    Hepatic function panel (last 6 months):   Lab Results  Component Value Date   AST 16 02/03/2022   ALT 17 02/03/2022   ALKPHOS 67 02/03/2022   BILITOT 0.5 02/03/2022    SCr (since admission):   Serum creatinine: 2.06 mg/dL (H) 02/04/22 0228 Estimated creatinine clearance: 27.4 mL/min (A)  Current therapy and lipid therapy tolerance Current lipid-lowering therapy: atorvastatin 40 mg daily Previous lipid-lowering therapies (if applicable): n/a Documented or reported allergies or intolerances to lipid-lowering therapies (if applicable): none  Assessment:   8 yom s/p RLE tibial bypass on high intensity statin PTA.  Plan:    1.Statin intensity (high intensity recommended for all patients regardless of the LDL):  No statin changes. The patient is already on a high intensity statin.  2.Add ezetimibe (if any one of the following):   Not indicated at this time.  3.Refer to lipid clinic:   No  4.Follow-up with:  Primary care provider - Arby Barrette, Weldon Picking, DO  5.Follow-up labs after discharge:  No changes in lipid therapy, repeat a lipid panel in one year.      Thank you for involving pharmacy in this patient's care.  Renold Genta, PharmD, BCPS Clinical Pharmacist Clinical phone for 02/04/2022 is [phone] 02/04/2022 9:27 AM

## 2022-02-04 NOTE — Evaluation (Signed)
Physical Therapy Evaluation Patient Details Name: [name] MRN: [id number] DOB: 05-Jan-1934 Today's Date: 02/04/2022  History of Present Illness  Pt is a 88 y/o male presenting on 11/30 with R foot ulceration. 11/30 s/p R CF to distal posterior tibial artery bypass to PTFE and 3rd toe amputation. PMH includes: anemia, cataracts, DM, HTN, gout.   Clinical Impression  Pt presents with condition above and deficits mentioned below, see PT Problem List. PTA, he was independent without DME, living with his daughter in a 1-level house with 2 STE. Pt reports his daughter is healthy and can physically assist 24/7 as needed, but was vague on whether she could give the amount of assistance he currently requires to come to stand. Pt is requiring a heavy modAx2 to power up to stand from an elevated height, but once standing he can hop with minAx1 (+2 for safety only) using a RW. He needs repeated reminders to maintain his R lower extremity NWB precautions and for hand placement with transfers. He demonstrates deficits in activity tolerance, balance, strength, R ankle dorsiflexion ROM, and bil distal sensation (hx of peripheral neuropathy). He is at risk for falls. Recommending drop arm BSC and w/c with elevating leg rests to increase his safety and independence with mobility. Provided his daughter can assist him as needed, recommending follow-up with HHPT. If this is not the case, then may need to pursue short-term rehab options at a SNF. Will continue to follow acutely.       Recommendations for follow up therapy are one component of a multi-disciplinary discharge planning process, led by the attending physician.  Recommendations may be updated based on patient status, additional functional criteria and insurance authorization.  Follow Up Recommendations Home health PT (provided he has the support needed at home)      Assistance Recommended at Discharge Intermittent Supervision/Assistance  Patient can  return home with the following  Two people to help with walking and/or transfers;A lot of help with bathing/dressing/bathroom;Assistance with cooking/housework;Assist for transportation;Help with stairs or ramp for entrance    Equipment Recommendations BSC/3in1;Wheelchair (measurements PT);Wheelchair cushion (measurements PT) (with drop arms for both and elevating leg rests for w/c)  Recommendations for Other Services       Functional Status Assessment Patient has had a recent decline in their functional status and demonstrates the ability to make significant improvements in function in a reasonable and predictable amount of time.     Precautions / Restrictions Precautions Precautions: Fall Restrictions Weight Bearing Restrictions: Yes RLE Weight Bearing: Non weight bearing (confirmed no heel weight bearing either)      Mobility  Bed Mobility Overal bed mobility: Needs Assistance Bed Mobility: Supine to Sit     Supine to sit: Supervision, HOB elevated     General bed mobility comments: Extra time to scoot hips to edge of bed but able to complete with supervision only for safety, HOB elevated.    Transfers Overall transfer level: Needs assistance Equipment used: Rolling walker (2 wheels) Transfers: Bed to chair/wheelchair/BSC, Sit to/from Stand Sit to Stand: Mod assist, +2 physical assistance, +2 safety/equipment, From elevated surface   Step pivot transfers: Min assist, +2 safety/equipment       General transfer comment: x2 sit to stand reps from elevated EOB, cuing pt to keep R foot off ground but needing foot of therapist to completely keep foot off ground during transfer. Repeated cues to push up from bed, needing heavy modAx2 to power up to stand. Once standing, pt able to hop  to L bed > recliner using RW while keeping R foot off ground on his own with only minA for stability, +2 for safety. Cues to kick R leg out and reach back for chair to sit.    Ambulation/Gait                General Gait Details: Only small hops bed > chair with RW and minA, +2 for safety  Stairs            Wheelchair Mobility    Modified Rankin (Stroke Patients Only)       Balance Overall balance assessment: Needs assistance Sitting-balance support: No upper extremity supported, Feet supported Sitting balance-Leahy Scale: Good Sitting balance - Comments: Limited slightly by R groin pain, but able to donn L sock in figure 4 position and reach to floor to pick up rag with supervision.   Standing balance support: Bilateral upper extremity supported, During functional activity, Reliant on assistive device for balance Standing balance-Leahy Scale: Poor Standing balance comment: Reliant on RW and external physical assistance                             Pertinent Vitals/Pain Pain Assessment Pain Assessment: Faces Faces Pain Scale: Hurts little more Pain Location: R foot, R groin Pain Descriptors / Indicators: Discomfort, Grimacing, Operative site guarding Pain Intervention(s): Limited activity within patient's tolerance, Monitored during session, Repositioned    Home Living Family/patient expects to be discharged to:: Private residence Living Arrangements: Children (daughter) Available Help at Discharge: Family;Available 24 hours/day Type of Home: House Home Access: Stairs to enter Entrance Stairs-Rails:  (post) Entrance Stairs-Number of Steps: 2   Home Layout: One level Home Equipment: Conservation officer, nature (2 wheels);Cane - single point      Prior Function Prior Level of Function : Independent/Modified Independent;Driving             Mobility Comments: Does not use AD. Reports no fall in past 6 months but 1x before that       Hand Dominance        Extremity/Trunk Assessment   Upper Extremity Assessment Upper Extremity Assessment: Defer to OT evaluation    Lower Extremity Assessment Lower Extremity Assessment: RLE  deficits/detail;LLE deficits/detail RLE Deficits / Details: s/p 3rd toe amputation; ACE bandage around foot and incision noted along medial knee/lower leg and groin access site; reports hx of peripheral neuropathy bil; able to lift against gravity; limited in ankle dorsiflexion AROM due to pain and bandages RLE Sensation: history of peripheral neuropathy LLE Deficits / Details: reports hx of peripheral neuropathy bil; able to lift against gravity LLE Sensation: history of peripheral neuropathy    Cervical / Trunk Assessment Cervical / Trunk Assessment: Normal  Communication   Communication: HOH (hearing aids)  Cognition Arousal/Alertness: Awake/alert Behavior During Therapy: WFL for tasks assessed/performed Overall Cognitive Status: Within Functional Limits for tasks assessed                                 General Comments: Pt with potentially poor insight into the reality of how much assistance he needs and deferring to completely answer whether this is possible at home        General Comments General comments (skin integrity, edema, etc.): VSS on RA; educated pt to begin AROM into dorsiflexion    Exercises     Assessment/Plan    PT Assessment Patient  needs continued PT services  PT Problem List Decreased strength;Decreased range of motion;Decreased activity tolerance;Decreased mobility;Decreased balance;Impaired sensation;Decreased skin integrity;Pain       PT Treatment Interventions DME instruction;Gait training;Stair training;Functional mobility training;Therapeutic exercise;Therapeutic activities;Balance training;Neuromuscular re-education;Patient/family education;Wheelchair mobility training    PT Goals (Current goals can be found in the Care Plan section)  Acute Rehab PT Goals Patient Stated Goal: to go home PT Goal Formulation: With patient Time For Goal Achievement: 02/18/22 Potential to Achieve Goals: Good    Frequency Min 3X/week      Co-evaluation PT/OT/SLP Co-Evaluation/Treatment: Yes Reason for Co-Treatment: For patient/therapist safety;To address functional/ADL transfers PT goals addressed during session: Mobility/safety with mobility;Balance;Proper use of DME         AM-PAC PT "6 Clicks" Mobility  Outcome Measure Help needed turning from your back to your side while in a flat bed without using bedrails?: None Help needed moving from lying on your back to sitting on the side of a flat bed without using bedrails?: A Little Help needed moving to and from a bed to a chair (including a wheelchair)?: A Little Help needed standing up from a chair using your arms (e.g., wheelchair or bedside chair)?: Total Help needed to walk in hospital room?: Total Help needed climbing 3-5 steps with a railing? : Total 6 Click Score: 13    End of Session Equipment Utilized During Treatment: Gait belt Activity Tolerance: Patient tolerated treatment well Patient left: in chair;with call bell/phone within reach;with chair alarm set Nurse Communication: Mobility status PT Visit Diagnosis: Unsteadiness on feet (R26.81);Other abnormalities of gait and mobility (R26.89);Muscle weakness (generalized) (M62.81);Difficulty in walking, not elsewhere classified (R26.2);Pain Pain - Right/Left: Right Pain - part of body: Leg    Time: 1779-3903 PT Time Calculation (min) (ACUTE ONLY): 31 min   Charges:   PT Evaluation $PT Eval Moderate Complexity: 1 Mod          Moishe Spice, PT, DPT Acute Rehabilitation Services  Office: [phone]   Orvan Falconer 02/04/2022, 10:23 AM

## 2022-02-04 NOTE — Progress Notes (Signed)
Mobility Specialist Progress Note:   02/04/22 1419  Mobility  Activity Transferred from chair to bed  Level of Assistance Minimal assist, patient does 75% or more (+2)  Assistive Device Front wheel walker  Distance Ambulated (ft) 2 ft  Activity Response Tolerated well  $Mobility charge 1 Mobility   Pt received in chair needing to get back to bed. MinA +2 to stand and pivot to bed. Left in bed with call bell in reach and all needs met.   [name] Mobility Specialist Please contact via [employer] or  Rehab Office at [phone]

## 2022-02-04 NOTE — Progress Notes (Signed)
Orthopedic Tech Progress Note Patient Details:  Valgene Deloatch [birth year] [id number] Left Post-op shoe at bedside Ortho Devices Type of Ortho Device: Postop shoe/boot Ortho Device/Splint Location: RLE Ortho Device/Splint Interventions: Ordered   Post Interventions Instructions Provided: Adjustment of device, Poper ambulation with device  Berneice Zettlemoyer A Niamya Vittitow 02/04/2022, 12:14 PM

## 2022-02-04 NOTE — Progress Notes (Addendum)
Progress Note    02/04/2022 8:35 AM 1 Day Post-Op  Subjective:  no major complaints. States he did have some pain overnight in right foot but resolved with pain medication    Vitals:   02/04/22 0504 02/04/22 0801  BP: 107/74 113/65  Pulse: (!) 104   Resp: 16   Temp: (!) 97.5 F (36.4 C) 97.7 F (36.5 C)  SpO2: 93% 94%   Physical Exam: Cardiac:  tachy, regular Lungs:  non labored Incisions:  Right groin dressing clean and dry, right below knee incision clean and dry. Right foot dressed Extremities:  RLE well perfused and warm. Palpable pulse in bypass in distal right leg. Doppler PT pedal signal in foot Abdomen:  soft Neurologic: alert and oriented  CBC    Component Value Date/Time   WBC 13.1 (H) 02/04/2022 0228   RBC 4.55 02/04/2022 0228   HGB 11.4 (L) 02/04/2022 0228   HGB 14.0 06/17/2020 1648   HGB 13.6 11/06/2015 1013   HCT 32.5 (L) 02/04/2022 0228   HCT 44.1 06/17/2020 1648   HCT 38.9 11/06/2015 1013   PLT 291 02/04/2022 0228   PLT 196 06/17/2020 1648   MCV 71.4 (L) 02/04/2022 0228   MCV 81 06/17/2020 1648   MCV 76.4 (L) 11/06/2015 1013   MCH 25.1 (L) 02/04/2022 0228   MCHC 35.1 02/04/2022 0228   RDW 15.0 02/04/2022 0228   RDW 16.1 (H) 06/17/2020 1648   RDW 15.3 (H) 11/06/2015 1013   LYMPHSABS 1.0 07/02/2021 0203   LYMPHSABS 3.5 (H) 11/06/2015 1013   MONOABS 0.5 07/02/2021 0203   MONOABS 0.9 11/06/2015 1013   EOSABS 0.0 07/02/2021 0203   EOSABS 0.6 (H) 11/06/2015 1013   BASOSABS 0.0 07/02/2021 0203   BASOSABS 0.0 11/06/2015 1013    BMET    Component Value Date/Time   NA 135 02/04/2022 0228   NA 140 11/29/2021 1624   NA 138 11/06/2015 1013   K 4.1 02/04/2022 0228   K 4.2 11/06/2015 1013   CL 108 02/04/2022 0228   CO2 20 (L) 02/04/2022 0228   CO2 19 (L) 11/06/2015 1013   GLUCOSE 154 (H) 02/04/2022 0228   GLUCOSE 116 11/06/2015 1013   BUN 32 (H) 02/04/2022 0228   BUN 33 (H) 11/29/2021 1624   BUN 23.5 11/06/2015 1013   CREATININE 2.06 (H)  02/04/2022 0228   CREATININE 1.3 11/06/2015 1013   CALCIUM 8.5 (L) 02/04/2022 0228   CALCIUM 9.4 11/06/2015 1013   GFRNONAA 31 (L) 02/04/2022 0228   GFRNONAA 54 (L) 09/23/2015 1037   GFRAA 49 (L) 09/17/2018 1406   GFRAA 62 09/23/2015 1037    INR    Component Value Date/Time   INR 1.1 02/03/2022 0831     Intake/Output Summary (Last 24 hours) at 02/04/2022 0835 Last data filed at 02/04/2022 0300 Gross per 24 hour  Intake 1200 ml  Output 975 ml  Net 225 ml     Assessment/Plan:  88 y.o. male is s/p R CF to distal posterior tibial artery bypass with PTFE and 3rd toe amputation  1 Day Post-Op   Doing well post op Pain management PRN RLE well perfused and warm with doppler PT signal and palpable pulse in bypass graft Incisions are intact and well appearing Hemodynamically stable Scr up slightly from baseline. Will increase fluids Dressing takedown tomorrow Order placed for post op shoe Will defer to Dr. Stanford Breed regarding weightbearing on RLE   DVT prophylaxis:  sq heparin    Karoline Caldwell, PA-C [facility] [phone] 02/04/2022 8:35 AM  VASCULAR STAFF ADDENDUM: I have independently interviewed and examined the patient. I agree with the above.  Doing well POD#1 after CFA-pedal bypass for CLTI with gangrene Keep dressing until POD#2. Mobilize as able with NWB to RLE to reduce risk of foot incisional dehiscence.  Yevonne Aline. Stanford Breed, MD [facility] Phone Number: [phone] 02/04/2022 9:40 AM

## 2022-02-05 LAB — GLUCOSE, CAPILLARY
Glucose-Capillary: 153 mg/dL — ABNORMAL HIGH (ref 70–99)
Glucose-Capillary: 149 mg/dL — ABNORMAL HIGH (ref 70–99)
Glucose-Capillary: 135 mg/dL — ABNORMAL HIGH (ref 70–99)
Glucose-Capillary: 157 mg/dL — ABNORMAL HIGH (ref 70–99)

## 2022-02-05 NOTE — Progress Notes (Signed)
Occupational Therapy Treatment Patient Details Name: [name] MRN: [id number] DOB: [birth year] Today's Date: 02/05/2022   History of present illness Pt is a 88 y.o. admitted 02/03/22 with R foot ulceration. S/p RLE posterior tibial bypass to PTFE and R 3rd toe amputation on 11/30. PMH includes anemia, cataracts, DM, HTN, gout.   OT comments  Pt. Seen for skilled OT treatment session.  Min/mod a for stand pivot to/from 3n1.  Pt. Struggling with nwb status even with cues and strategies.  Cont. With acute OT goals with focus on maintaining precautions.  Agree with current d/c recommendations for frequent/constant S and assistance.    Recommendations for follow up therapy are one component of a multi-disciplinary discharge planning process, led by the attending physician.  Recommendations may be updated based on patient status, additional functional criteria and insurance authorization.    Follow Up Recommendations  Home health OT     Assistance Recommended at Discharge Frequent or constant Supervision/Assistance  Patient can return home with the following  A lot of help with walking and/or transfers;A lot of help with bathing/dressing/bathroom;Assistance with cooking/housework;Assist for transportation;Help with stairs or ramp for entrance   Equipment Recommendations  BSC/3in1;Wheelchair (measurements OT);Wheelchair cushion (measurements OT)    Recommendations for Other Services      Precautions / Restrictions Precautions Precautions: Fall Restrictions Weight Bearing Restrictions: Yes RLE Weight Bearing: Non weight bearing       Mobility Bed Mobility               General bed mobility comments: oob in recliner upon arrival and at end of session also    Transfers Overall transfer level: Needs assistance Equipment used: Rolling walker (2 wheels) Transfers: Sit to/from Stand, Bed to chair/wheelchair/BSC Sit to Stand: Min guard Stand pivot transfers: Min assist,  Mod assist         General transfer comment: maintaining nwb proving to be a challenge for pt. states "im trying" but visible heel placement on floor during pivot even with max encouragement to push through b ues     Balance                                           ADL either performed or assessed with clinical judgement   ADL Overall ADL's : Needs assistance/impaired                         Toilet Transfer: Minimal assistance;Moderate assistance;Cueing for safety;Cueing for sequencing;Stand-pivot;Rolling walker (2 wheels) Toilet Transfer Details (indicate cue type and reason): recliner to 3n1 and back to recliner         Functional mobility during ADLs: Minimal assistance;Moderate assistance;Cueing for safety;Cueing for sequencing;Rolling walker (2 wheels) General ADL Comments: states he will utilize use of urinal and 3n1 at home secondary to decreasing ambulation distance since he is struggling with nwb RLE    Extremity/Trunk Assessment              Vision       Perception     Praxis      Cognition Arousal/Alertness: Awake/alert Behavior During Therapy: WFL for tasks assessed/performed Overall Cognitive Status: Within Functional Limits for tasks assessed                                 General  Comments: cognition WFL, though pt with poor insight into importance of keeping R foot NWB to allow wound healing since this is difficult for him to maintain        Exercises      Shoulder Instructions       General Comments increased time educ on RLE NWB Precautions, post-op shoe wear, recommendation to use w/c and BSC since difficulty keeping weight off R foot with ambulation    Pertinent Vitals/ Pain       Pain Assessment Pain Assessment: Faces Faces Pain Scale: Hurts a little bit Pain Location: back Pain Descriptors / Indicators: Aching Pain Intervention(s): Limited activity within patient's tolerance,  Repositioned  Home Living                                          Prior Functioning/Environment              Frequency  Min 2X/week        Progress Toward Goals  OT Goals(current goals can now be found in the care plan section)  Progress towards OT goals: Progressing toward goals     Plan      Co-evaluation                 AM-PAC OT "6 Clicks" Daily Activity     Outcome Measure   Help from another person eating meals?: None Help from another person taking care of personal grooming?: A Little Help from another person toileting, which includes using toliet, bedpan, or urinal?: A Lot Help from another person bathing (including washing, rinsing, drying)?: A Lot Help from another person to put on and taking off regular upper body clothing?: A Little Help from another person to put on and taking off regular lower body clothing?: A Lot 6 Click Score: 16    End of Session Equipment Utilized During Treatment: Gait belt;Rolling walker (2 wheels)  OT Visit Diagnosis: Other abnormalities of gait and mobility (R26.89);Muscle weakness (generalized) (M62.81);Pain Pain - Right/Left: Right Pain - part of body: Ankle and joints of foot   Activity Tolerance Patient tolerated treatment well   Patient Left in chair;with call bell/phone within reach   Nurse Communication          Time: [phone] OT Time Calculation (min): 17 min  Charges: OT General Charges $OT Visit: 1 Visit OT Treatments $Self Care/Home Management : 8-22 mins  Sonia Baller, COTA/L Acute Rehabilitation [phone]   Clearnce Sorrel Lorraine-COTA/L 02/05/2022, 12:32 PM

## 2022-02-05 NOTE — Progress Notes (Signed)
Physical Therapy Treatment Patient Details Name: [name] MRN: [id number] DOB: Dec 14, 1933 Today's Date: 02/05/2022   History of Present Illness Pt is a 88 y.o. admitted 02/03/22 with R foot ulceration. S/p RLE posterior tibial bypass to PTFE and R 3rd toe amputation on 11/30. PMH includes anemia, cataracts, DM, HTN, gout.   PT Comments    Pt progressing with mobility. Today's session focused on transfer and gait training with RW, though ambulation distance limited as pt ultimately unable to keep weight off of R foot for more than a few steps; max education and encouragement to attempt NWB, pt regressing to TDWB. Discussed importance of precautions for wound healing and recommendation for w/c use at home to keep weight off foot. Other than inability to keep R foot NWB, pt demonstrates improving strength and activity tolerance. Will continue to follow acutely to address established goals.    Recommendations for follow up therapy are one component of a multi-disciplinary discharge planning process, led by the attending physician.  Recommendations may be updated based on patient status, additional functional criteria and insurance authorization.  Follow Up Recommendations  Home health PT     Assistance Recommended at Discharge Intermittent Supervision/Assistance  Patient can return home with the following A little help with walking and/or transfers;A little help with bathing/dressing/bathroom;Assistance with cooking/housework;Assist for transportation;Help with stairs or ramp for entrance   Equipment Recommendations  BSC/3in1;Wheelchair (measurements PT);Wheelchair cushion (measurements PT) (w/c with drop arm rests and elevating leg rests)    Recommendations for Other Services       Precautions / Restrictions Precautions Precautions: Fall Restrictions Weight Bearing Restrictions: Yes RLE Weight Bearing: Non weight bearing     Mobility  Bed Mobility Overal bed mobility:  Modified Independent Bed Mobility: Supine to Sit           General bed mobility comments: HOB elevated    Transfers Overall transfer level: Needs assistance Equipment used: Rolling walker (2 wheels) Transfers: Sit to/from Stand Sit to Stand: Min guard           General transfer comment: cues for positioning and sequencing to encourage R foot NWB (pt with poor carryover from yesterday's session), able to stand from low bed height and recliner to RW with min guard for balance; repeated cues for sequencing when going to sit, including RLE extended to ensure NWB    Ambulation/Gait Ambulation/Gait assistance: Min guard Gait Distance (Feet): 16 Feet Assistive device: Rolling walker (2 wheels) Gait Pattern/deviations: Step-to pattern Gait velocity: Decreased     General Gait Details: pt initially attempting to hop on L foot when cued to keep R foot NWB, regressing to ambulation with RLE TDWB requiring frequent cues to attempt to keep as much weight off R foot as possible (post-op shoe donned), pt still with difficulty doing this therefore further ambulation distance deferred   Stairs             Wheelchair Mobility    Modified Rankin (Stroke Patients Only)       Balance Overall balance assessment: Needs assistance Sitting-balance support: No upper extremity supported, Feet supported Sitting balance-Leahy Scale: Good Sitting balance - Comments: required assist to don R post-op shoe due to pain at R groin incision site   Standing balance support: Bilateral upper extremity supported, During functional activity, Reliant on assistive device for balance Standing balance-Leahy Scale: Poor Standing balance comment: reliant on UE support to maintain R foot WB precautions  Cognition Arousal/Alertness: Awake/alert Behavior During Therapy: WFL for tasks assessed/performed Overall Cognitive Status: Within Functional Limits for tasks  assessed                                 General Comments: cognition WFL, though pt with poor insight into importance of keeping R foot NWB to allow wound healing since this is difficult for him to maintain        Exercises      General Comments General comments (skin integrity, edema, etc.): increased time educ on RLE NWB Precautions, post-op shoe wear, recommendation to use w/c and BSC since difficulty keeping weight off R foot with ambulation      Pertinent Vitals/Pain Pain Assessment Pain Assessment: Faces Faces Pain Scale: Hurts little more Pain Location: R groin incision Pain Descriptors / Indicators: Discomfort, Grimacing, Operative site guarding Pain Intervention(s): Monitored during session, Limited activity within patient's tolerance    Home Living                          Prior Function            PT Goals (current goals can now be found in the care plan section) Progress towards PT goals: Progressing toward goals    Frequency    Min 3X/week      PT Plan Current plan remains appropriate    Co-evaluation              AM-PAC PT "6 Clicks" Mobility   Outcome Measure  Help needed turning from your back to your side while in a flat bed without using bedrails?: None Help needed moving from lying on your back to sitting on the side of a flat bed without using bedrails?: None Help needed moving to and from a bed to a chair (including a wheelchair)?: A Little Help needed standing up from a chair using your arms (e.g., wheelchair or bedside chair)?: A Little Help needed to walk in hospital room?: Total Help needed climbing 3-5 steps with a railing? : Total 6 Click Score: 16    End of Session Equipment Utilized During Treatment: Gait belt Activity Tolerance: Patient tolerated treatment well Patient left: in chair;with call bell/phone within reach;with chair alarm set;with nursing/sitter in room Nurse Communication: Mobility  status PT Visit Diagnosis: Unsteadiness on feet (R26.81);Other abnormalities of gait and mobility (R26.89);Difficulty in walking, not elsewhere classified (R26.2);Pain Pain - Right/Left: Right Pain - part of body: Leg     Time: 0805-0821 PT Time Calculation (min) (ACUTE ONLY): 16 min  Charges:  $Therapeutic Activity: 8-22 mins                     Mabeline Caras, PT, DPT Acute Rehabilitation Services  Personal: [facility] Office: [place] 02/05/2022, 10:31 AM

## 2022-02-05 NOTE — Progress Notes (Addendum)
Progress Note    02/05/2022 10:33 AM 2 Days Post-Op  Subjective:  no complaints  Afebrile HR 90's-110's  818'E-993'Z systolic 16% RA  Vitals:   02/05/22 0400 02/05/22 0733  BP: 116/79 115/81  Pulse: 95 94  Resp: 20 13  Temp: 98.5 F (36.9 C) (!) 97.5 F (36.4 C)  SpO2: 95% 95%    Physical Exam: General:  sitting in chair in no distress Cardiac:  regular Lungs:  non labored Incisions:  right groin, below knee and ankle incisions all look fine Right great toe amp site:  Extremities:  +doppler flow right PT/AT/peroneal; right calf and anterior compartments are soft and non tender Abdomen:  soft  CBC    Component Value Date/Time   WBC 13.1 (H) 02/04/2022 0228   RBC 4.55 02/04/2022 0228   HGB 11.4 (L) 02/04/2022 0228   HGB 14.0 06/17/2020 1648   HGB 13.6 11/06/2015 1013   HCT 32.5 (L) 02/04/2022 0228   HCT 44.1 06/17/2020 1648   HCT 38.9 11/06/2015 1013   PLT 291 02/04/2022 0228   PLT 196 06/17/2020 1648   MCV 71.4 (L) 02/04/2022 0228   MCV 81 06/17/2020 1648   MCV 76.4 (L) 11/06/2015 1013   MCH 25.1 (L) 02/04/2022 0228   MCHC 35.1 02/04/2022 0228   RDW 15.0 02/04/2022 0228   RDW 16.1 (H) 06/17/2020 1648   RDW 15.3 (H) 11/06/2015 1013   LYMPHSABS 1.0 07/02/2021 0203   LYMPHSABS 3.5 (H) 11/06/2015 1013   MONOABS 0.5 07/02/2021 0203   MONOABS 0.9 11/06/2015 1013   EOSABS 0.0 07/02/2021 0203   EOSABS 0.6 (H) 11/06/2015 1013   BASOSABS 0.0 07/02/2021 0203   BASOSABS 0.0 11/06/2015 1013    BMET    Component Value Date/Time   NA 135 02/04/2022 0228   NA 140 11/29/2021 1624   NA 138 11/06/2015 1013   K 4.1 02/04/2022 0228   K 4.2 11/06/2015 1013   CL 108 02/04/2022 0228   CO2 20 (L) 02/04/2022 0228   CO2 19 (L) 11/06/2015 1013   GLUCOSE 154 (H) 02/04/2022 0228   GLUCOSE 116 11/06/2015 1013   BUN 32 (H) 02/04/2022 0228   BUN 33 (H) 11/29/2021 1624   BUN 23.5 11/06/2015 1013   CREATININE 2.06 (H) 02/04/2022 0228   CREATININE 1.3 11/06/2015 1013    CALCIUM 8.5 (L) 02/04/2022 0228   CALCIUM 9.4 11/06/2015 1013   GFRNONAA 31 (L) 02/04/2022 0228   GFRNONAA 54 (L) 09/23/2015 1037   GFRAA 49 (L) 09/17/2018 1406   GFRAA 62 09/23/2015 1037    INR    Component Value Date/Time   INR 1.1 02/03/2022 0831     Intake/Output Summary (Last 24 hours) at 02/05/2022 1033 Last data filed at 02/05/2022 9678 Gross per 24 hour  Intake 240 ml  Output 1400 ml  Net -1160 ml      Assessment/Plan:  88 y.o. male is s/p:   R CF to distal posterior tibial artery bypass with PTFE and 3rd toe amputation   2 Days Post-Op   -pt with right PT/AT/peroneal doppler flow; right groin and below knee and ankle incisions all look fine -dressing removed from right 3rd toe amputation-per Dr. Stanford Breed, pt NWB to reduce risk of foot incision dehiscence. -daily dressing changes right foot -had bump in creatinine  yesterday, will check labs tomorrow -DVT prophylaxis:  sq heparin   Leontine Locket, PA-C [facility] [phone] 02/05/2022 10:33 AM  VASCULAR STAFF ADDENDUM: I have independently interviewed and  examined the patient. I agree with the above.  Incisions healing well. Nonweightbearing   Cassandria Santee, MD [facility] Phone Number: [phone] 02/05/2022 12:38 PM

## 2022-02-06 LAB — BASIC METABOLIC PANEL
Anion gap: 12 (ref 5–15)
BUN: 33 mg/dL — ABNORMAL HIGH (ref 8–23)
CO2: 19 mmol/L — ABNORMAL LOW (ref 22–32)
Calcium: 9 mg/dL (ref 8.9–10.3)
Chloride: 107 mmol/L (ref 98–111)
Creatinine, Ser: 1.49 mg/dL — ABNORMAL HIGH (ref 0.61–1.24)
GFR, Estimated: 45 mL/min — ABNORMAL LOW (ref >60)
Glucose, Bld: 155 mg/dL — ABNORMAL HIGH (ref 70–99)
Potassium: 3.8 mmol/L (ref 3.5–5.1)
Sodium: 138 mmol/L (ref 135–145)

## 2022-02-06 LAB — CBC
HCT: 35.3 % — ABNORMAL LOW (ref 39.0–52.0)
Hemoglobin: 12.3 g/dL — ABNORMAL LOW (ref 13.0–17.0)
MCH: 24.6 pg — ABNORMAL LOW (ref 26.0–34.0)
MCHC: 34.8 g/dL (ref 30.0–36.0)
MCV: 70.5 fL — ABNORMAL LOW (ref 80.0–100.0)
Platelets: 282 10*3/uL (ref 150–400)
RBC: 5.01 MIL/uL (ref 4.22–5.81)
RDW: 15.1 % (ref 11.5–15.5)
WBC: 13.2 10*3/uL — ABNORMAL HIGH (ref 4.0–10.5)
nRBC: 0 % (ref 0.0–0.2)

## 2022-02-06 LAB — GLUCOSE, CAPILLARY
Glucose-Capillary: 129 mg/dL — ABNORMAL HIGH (ref 70–99)
Glucose-Capillary: 130 mg/dL — ABNORMAL HIGH (ref 70–99)
Glucose-Capillary: 160 mg/dL — ABNORMAL HIGH (ref 70–99)
Glucose-Capillary: 133 mg/dL — ABNORMAL HIGH (ref 70–99)
Glucose-Capillary: 133 mg/dL — ABNORMAL HIGH (ref 70–99)

## 2022-02-06 MED ORDER — SODIUM CHLORIDE 0.9 % IV SOLN
2.0000 g | Freq: Two times a day (BID) | INTRAVENOUS | Status: DC
  Administered 2022-02-06 – 2022-02-07 (×3): 2 g via INTRAVENOUS
  Filled 2022-02-06 (×3): qty 12.5

## 2022-02-06 NOTE — Progress Notes (Addendum)
Progress Note    02/06/2022 7:27 AM 3 Days Post-Op  Subjective:  says his leg feels pretty good   Afebrile HR 80's-100's NSR 242'P-536'R systolic 44% RA  Vitals:   02/06/22 0049 02/06/22 0338  BP: 122/75 125/79  Pulse: 93 100  Resp: 16 18  Temp: 97.9 F (36.6 C) 98.1 F (36.7 C)  SpO2: 98% 99%    Physical Exam: General:  resting in no distress Cardiac:  regular Lungs:  non labored Incisions:  right groin and right below knee incisions look good with steri strips in place.  Right ankle incision looks good with nylon sutures.  Right toe amp site in tact.  Extremities:  brisk right PT doppler flow   CBC    Component Value Date/Time   WBC 13.2 (H) 02/06/2022 0047   RBC 5.01 02/06/2022 0047   HGB 12.3 (L) 02/06/2022 0047   HGB 14.0 06/17/2020 1648   HGB 13.6 11/06/2015 1013   HCT 35.3 (L) 02/06/2022 0047   HCT 44.1 06/17/2020 1648   HCT 38.9 11/06/2015 1013   PLT 282 02/06/2022 0047   PLT 196 06/17/2020 1648   MCV 70.5 (L) 02/06/2022 0047   MCV 81 06/17/2020 1648   MCV 76.4 (L) 11/06/2015 1013   MCH 24.6 (L) 02/06/2022 0047   MCHC 34.8 02/06/2022 0047   RDW 15.1 02/06/2022 0047   RDW 16.1 (H) 06/17/2020 1648   RDW 15.3 (H) 11/06/2015 1013   LYMPHSABS 1.0 07/02/2021 0203   LYMPHSABS 3.5 (H) 11/06/2015 1013   MONOABS 0.5 07/02/2021 0203   MONOABS 0.9 11/06/2015 1013   EOSABS 0.0 07/02/2021 0203   EOSABS 0.6 (H) 11/06/2015 1013   BASOSABS 0.0 07/02/2021 0203   BASOSABS 0.0 11/06/2015 1013    BMET    Component Value Date/Time   NA 138 02/06/2022 0047   NA 140 11/29/2021 1624   NA 138 11/06/2015 1013   K 3.8 02/06/2022 0047   K 4.2 11/06/2015 1013   CL 107 02/06/2022 0047   CO2 19 (L) 02/06/2022 0047   CO2 19 (L) 11/06/2015 1013   GLUCOSE 155 (H) 02/06/2022 0047   GLUCOSE 116 11/06/2015 1013   BUN 33 (H) 02/06/2022 0047   BUN 33 (H) 11/29/2021 1624   BUN 23.5 11/06/2015 1013   CREATININE 1.49 (H) 02/06/2022 0047   CREATININE 1.3 11/06/2015  1013   CALCIUM 9.0 02/06/2022 0047   CALCIUM 9.4 11/06/2015 1013   GFRNONAA 45 (L) 02/06/2022 0047   GFRNONAA 54 (L) 09/23/2015 1037   GFRAA 49 (L) 09/17/2018 1406   GFRAA 62 09/23/2015 1037    INR    Component Value Date/Time   INR 1.1 02/03/2022 0831     Intake/Output Summary (Last 24 hours) at 02/06/2022 0727 Last data filed at 02/06/2022 0100 Gross per 24 hour  Intake 120 ml  Output 600 ml  Net -480 ml      Assessment/Plan:  88 y.o. male is s/p:  R CF to distal posterior tibial artery bypass with PTFE and 3rd toe amputation   3 Days Post-Op   -pt doing well with brisk right PT doppler signal -bandage changed this morning right foot and wound looks good. Pt worked with PT yesterday and they are recommending HHPT/OT.  TOC for DME needs.  Face to face ordered. -creatinine much improved today to 1.49 from 2.06 yesterday -acute blood loss anemia also improved with hgb 12.3 today from 11.4 yesterday -DVT prophylaxis:  sq heparin -wound cx with enterobacter and leukocytosis of  13k.  Will start IV abx.    Leontine Locket, PA-C [facility] [phone] 02/06/2022 7:27 AM  VASCULAR STAFF ADDENDUM: I have independently interviewed and examined the patient. I agree with the above.    Cassandria Santee, MD [facility] Phone Number: [phone] 02/06/2022 2:22 PM

## 2022-02-06 NOTE — Progress Notes (Cosign Needed)
   Durable Medical Equipment (From admission, onward)        Start     Ordered  02/04/22 1039  For home use only DME standard manual wheelchair with seat cushion  Once      Comments: Patient suffers from right 3rd toe amputation and is now NWB which impairs their ability to perform daily activities like dressing in the home.  A walker will not resolve issue with performing activities of daily living. A wheelchair will allow patient to safely perform daily activities. Patient can safely propel the wheelchair in the home or has a caregiver who can provide assistance. Length of need 6 months . Accessories: elevating leg rests (ELRs), wheel locks, extensions and anti-tippers.  02/04/22 1039  02/04/22 1038  For home use only DME 3 n 1  Once      Comments: With drop arms  02/04/22 1039

## 2022-02-07 ENCOUNTER — Other Ambulatory Visit (HOSPITAL_COMMUNITY): Payer: Self-pay

## 2022-02-07 LAB — GLUCOSE, CAPILLARY: Glucose-Capillary: 157 mg/dL — ABNORMAL HIGH (ref 70–99)

## 2022-02-07 MED ORDER — APIXABAN 5 MG PO TABS
5.0000 mg | ORAL_TABLET | Freq: Two times a day (BID) | ORAL | 3 refills | Status: DC
  Filled 2022-02-07: qty 60, 30d supply, fill #0

## 2022-02-07 MED ORDER — OXYCODONE-ACETAMINOPHEN 5-325 MG PO TABS
1.0000 | ORAL_TABLET | Freq: Three times a day (TID) | ORAL | 0 refills | Status: DC | PRN
  Filled 2022-02-07: qty 15, 5d supply, fill #0

## 2022-02-07 MED ORDER — CIPROFLOXACIN HCL 500 MG PO TABS
500.0000 mg | ORAL_TABLET | Freq: Two times a day (BID) | ORAL | 0 refills | Status: AC
  Filled 2022-02-07: qty 14, 7d supply, fill #0

## 2022-02-07 MED ORDER — APIXABAN 5 MG PO TABS
5.0000 mg | ORAL_TABLET | Freq: Two times a day (BID) | ORAL | Status: DC
  Administered 2022-02-07: 5 mg via ORAL
  Filled 2022-02-07: qty 1

## 2022-02-07 NOTE — Progress Notes (Signed)
ANTICOAGULATION CONSULT NOTE - Initial Consult  Pharmacy Consult for apixaban Indication:  bypass patency  No Known Allergies  Patient Measurements: Height: 5\' 11"  (180.3 cm) Weight: 83.9 kg (185 lb) IBW/kg (Calculated) : 75.3  Vital Signs: Temp: 97.5 F (36.4 C) (12/04 0819) Temp Source: Oral (12/04 0819) BP: 114/84 (12/04 0819) Pulse Rate: 103 (12/04 0819)  Labs: Recent Labs    02/06/22 0047  HGB 12.3*  HCT 35.3*  PLT 282  CREATININE 1.49*    Estimated Creatinine Clearance: 37.9 mL/min (A) (by C-G formula based on SCr of 1.49 mg/dL (H)).   Medical History: Past Medical History:  Diagnosis Date   ANEMIA, IRON DEF, NOS 01/15/2007   Qualifier: Diagnosis of  By: Jeannine Kitten MD, Matthew     Arthritis    Asthma    Cataract extraction status of left eye 2016   Cataract extraction status of right eye 2016   CHOLECYSTECTOMY, LAPAROSCOPIC, HX OF 04/12/2007   Qualifier: Diagnosis of  By: Netty Starring  MD, Lucianne Muss     COPD (chronic obstructive pulmonary disease) ([place])    Critical lower limb ischemia ([place]) 10/02/2018   Diabetes mellitus without complication ([place])    Gout 2007   Hypertension    Neuromuscular disorder ([place])    Neuropathy   Neuropathy    bilateral feet   Pneumonia    Toe ulcer ([place]) 09/20/2018    Medications:  Medications Prior to Admission  Medication Sig Dispense Refill Last Dose   allopurinol (ZYLOPRIM) 100 MG tablet Take 1 tablet (100 mg total) by mouth daily. (Patient taking differently: Take 150 mg by mouth daily.) 30 tablet 3 02/03/2022 at 0600   aspirin 81 MG EC tablet Take 81 mg by mouth daily.   02/03/2022 at 0600   atorvastatin (LIPITOR) 40 MG tablet Take 1 tablet (40 mg total) by mouth daily. 90 tablet 3 02/03/2022 at 0600   cycloSPORINE (RESTASIS) 0.05 % ophthalmic emulsion Place 1 drop into both eyes 2 (two) times daily. (Patient taking differently: Place 1 drop into both eyes 2 (two) times daily as needed (dry eyes).) 60 mL 3 02/03/2022 at 0600    diclofenac Sodium (VOLTAREN) 1 % GEL TAKE ON AFFECTED AREAS AS NEEDED FOR PAIN UP TO 4 TIMES A DAY 400 g 3 Past Week   gabapentin (NEURONTIN) 300 MG capsule TAKE 1 CAPSULE BY MOUTH FOUR TIMES A DAY (Patient taking differently: Take 300-600 mg by mouth See admin instructions. 300 mg in the morning, 600 mg at bedtime) 120 capsule 0 02/03/2022 at 0600   JARDIANCE 25 MG TABS tablet TAKE 1 TABLET (25 MG TOTAL) BY MOUTH DAILY. 90 tablet 3 02/02/2022   levocetirizine (XYZAL) 5 MG tablet TAKE 1 TABLET BY MOUTH TWICE A DAY (Patient taking differently: Take 10 mg by mouth every morning.) 180 tablet 3 02/03/2022 at 0600   Multiple Vitamins-Minerals (MULTIVITAMIN WITH MINERALS) tablet Take 1 tablet by mouth daily.   02/03/2022 at 0600   tiotropium (SPIRIVA) 18 MCG inhalation capsule INHALE 1 CAPSULE VIA HANDIHALER ONCE DAILY AT THE SAME TIME EVERY DAY 30 capsule 2 02/03/2022 at 0600   [place] 250-50 MCG/ACT AEPB INHALE 1 PUFF INTO THE LUNGS 2 (TWO) TIMES DAILY. IN THE MORNING AND AT BEDTIME. 60 each 2 02/03/2022 at 0600   albuterol (PROVENTIL) (2.5 MG/3ML) 0.083% nebulizer solution Take 3 mLs (2.5 mg total) by nebulization every 6 (six) hours as needed for wheezing or shortness of breath. 150 mL 1 More than a month   albuterol (VENTOLIN HFA)  108 (90 Base) MCG/ACT inhaler Inhale 2 puffs into the lungs every 6 (six) hours as needed for wheezing or shortness of breath.   More than a month   triamcinolone cream (KENALOG) 0.1 % Apply topically as needed (Skin itching). 30 g 0 More than a month    Assessment: 46 yom s/p tibial artery bypass on 11/30. Pharmacy consulted to dose apixaban to assist with bypass patency. Clarified full dose is wanted per Dr Stanford Breed.  Renal function is at baseline. No bleeding noted, Hgb 12.3 and stable post-op, platelets are normal.  Plan:  Discontinue heparin SQ Apixaban 5 mg PO bid Educated prior to discharge and check copay Pharmacy signing off but will continue to follow  peripherally - please re-consult if needed  Thank you for involving pharmacy in this patient's care.  Renold Genta, PharmD, BCPS Clinical Pharmacist Clinical phone for 02/07/2022 is [phone] 02/07/2022 9:53 AM

## 2022-02-07 NOTE — Plan of Care (Signed)
  Problem: Education: Goal: Understanding of CV disease, CV risk reduction, and recovery process will improve Outcome: Adequate for Discharge Goal: Individualized Educational Video(s) Outcome: Adequate for Discharge   Problem: Activity: Goal: Ability to return to baseline activity level will improve Outcome: Adequate for Discharge   Problem: Cardiovascular: Goal: Ability to achieve and maintain adequate cardiovascular perfusion will improve Outcome: Adequate for Discharge Goal: Vascular access site(s) Level 0-1 will be maintained Outcome: Adequate for Discharge   Problem: Health Behavior/Discharge Planning: Goal: Ability to safely manage health-related needs after discharge will improve Outcome: Adequate for Discharge   Problem: Education: Goal: Ability to describe self-care measures that may prevent or decrease complications (Diabetes Survival Skills Education) will improve Outcome: Adequate for Discharge Goal: Individualized Educational Video(s) Outcome: Adequate for Discharge   Problem: Coping: Goal: Ability to adjust to condition or change in health will improve Outcome: Adequate for Discharge   Problem: Fluid Volume: Goal: Ability to maintain a balanced intake and output will improve Outcome: Adequate for Discharge   Problem: Health Behavior/Discharge Planning: Goal: Ability to identify and utilize available resources and services will improve Outcome: Adequate for Discharge Goal: Ability to manage health-related needs will improve Outcome: Adequate for Discharge   Problem: Metabolic: Goal: Ability to maintain appropriate glucose levels will improve Outcome: Adequate for Discharge   Problem: Nutritional: Goal: Maintenance of adequate nutrition will improve Outcome: Adequate for Discharge Goal: Progress toward achieving an optimal weight will improve Outcome: Adequate for Discharge   Problem: Skin Integrity: Goal: Risk for impaired skin integrity will  decrease Outcome: Adequate for Discharge   Problem: Tissue Perfusion: Goal: Adequacy of tissue perfusion will improve Outcome: Adequate for Discharge   Problem: Education: Goal: Knowledge of prescribed regimen will improve Outcome: Adequate for Discharge   Problem: Activity: Goal: Ability to tolerate increased activity will improve Outcome: Adequate for Discharge   Problem: Bowel/Gastric: Goal: Gastrointestinal status for postoperative course will improve Outcome: Adequate for Discharge   Problem: Clinical Measurements: Goal: Postoperative complications will be avoided or minimized Outcome: Adequate for Discharge Goal: Signs and symptoms of graft occlusion will improve Outcome: Adequate for Discharge

## 2022-02-07 NOTE — Progress Notes (Signed)
Occupational Therapy Treatment Patient Details Name: [name] MRN: [id number] DOB: Jun 06, 1933 Today's Date: 02/07/2022   History of present illness Pt is a 88 y.o. admitted 02/03/22 with R foot ulceration. S/p RLE posterior tibial bypass to PTFE and R 3rd toe amputation on 11/30. PMH includes anemia, cataracts, DM, HTN, gout.   OT comments  Pt progressing well towards OT goals.  Focused on ADLs in prep for dc home- completing toilet transfers using squat pivot method with min guard and toileting with supervision (lateral leans).  Discussed compensatory techniques and safety for LB bathing and dressing.  Pt requires intermittent cueing for R LE NWB, typically at the start of every transfer then able to maintain throughout.  Will have intermittent support of daughter, continue to recommend [place].    Recommendations for follow up therapy are one component of a multi-disciplinary discharge planning process, led by the attending physician.  Recommendations may be updated based on patient status, additional functional criteria and insurance authorization.    Follow Up Recommendations  Home health OT     Assistance Recommended at Discharge Frequent or constant Supervision/Assistance  Patient can return home with the following  A little help with walking and/or transfers;A little help with bathing/dressing/bathroom;Assist for transportation;Help with stairs or ramp for entrance;Assistance with cooking/housework   Equipment Recommendations  BSC/3in1;Wheelchair (measurements OT);Wheelchair cushion (measurements OT) (drop arm)    Recommendations for Other Services      Precautions / Restrictions Precautions Precautions: Fall Restrictions Weight Bearing Restrictions: Yes RLE Weight Bearing: Non weight bearing       Mobility Bed Mobility Overal bed mobility: Modified Independent                  Transfers Overall transfer level: Needs assistance   Transfers: Bed to  chair/wheelchair/BSC     Squat pivot transfers: Min guard       General transfer comment: pt maintaining NWB to R LE for squat pivot x 3, min cueing for technique     Balance Overall balance assessment: Needs assistance Sitting-balance support: No upper extremity supported, Feet supported Sitting balance-Leahy Scale: Good                                     ADL either performed or assessed with clinical judgement   ADL Overall ADL's : Needs assistance/impaired     Grooming: Set up;Sitting               Lower Body Dressing: Min guard;Sitting/lateral leans   Toilet Transfer: Min Runner, broadcasting/film/video Details (indicate cue type and reason): to/from 3:1, then simulated again to recliner Toileting- Clothing Manipulation and Hygiene: Supervision/safety;Sitting/lateral lean Toileting - Clothing Manipulation Details (indicate cue type and reason): lateral leans for hygiene     Functional mobility during ADLs: Min guard;Cueing for safety General ADL Comments: pt demonstrating significant improvement of adherence to RLE NWB    Extremity/Trunk Assessment              Vision       Perception     Praxis      Cognition Arousal/Alertness: Awake/alert Behavior During Therapy: WFL for tasks assessed/performed Overall Cognitive Status: Within Functional Limits for tasks assessed  Exercises      Shoulder Instructions       General Comments      Pertinent Vitals/ Pain       Pain Assessment Pain Assessment: No/denies pain Pain Intervention(s): Monitored during session  Home Living                                          Prior Functioning/Environment              Frequency  Min 2X/week        Progress Toward Goals  OT Goals(current goals can now be found in the care plan section)  Progress towards OT goals: Progressing toward  goals  Acute Rehab OT Goals Patient Stated Goal: home OT Goal Formulation: With patient Time For Goal Achievement: 02/18/22 Potential to Achieve Goals: [place] Discharge plan remains appropriate;Frequency remains appropriate    Co-evaluation                 AM-PAC OT "6 Clicks" Daily Activity     Outcome Measure   Help from another person eating meals?: None Help from another person taking care of personal grooming?: A Little Help from another person toileting, which includes using toliet, bedpan, or urinal?: A Little Help from another person bathing (including washing, rinsing, drying)?: A Little Help from another person to put on and taking off regular upper body clothing?: A Little Help from another person to put on and taking off regular lower body clothing?: A Little 6 Click Score: 19    End of Session Equipment Utilized During Treatment: Gait belt  OT Visit Diagnosis: Other abnormalities of gait and mobility (R26.89);Muscle weakness (generalized) (M62.81);Pain Pain - Right/Left: Right Pain - part of body: Ankle and joints of foot   Activity Tolerance Patient tolerated treatment well   Patient Left in chair;with call bell/phone within reach;with chair alarm set   Nurse Communication Mobility status;Precautions        Time: 7017-7939 OT Time Calculation (min): 31 min  Charges: OT General Charges $OT Visit: 1 Visit OT Treatments $Self Care/Home Management : 23-37 mins  [place] Office [phone]   Delight Stare 02/07/2022, 11:16 AM

## 2022-02-07 NOTE — Discharge Summary (Signed)
Discharge Summary     [name] 07/09/1933 88 y.o. male  [id number]  Admission Date: 02/03/2022  Discharge Date: 02/09/2022  Physician: No att. providers found  Admission Diagnosis: PAD (peripheral artery disease) ([place]) [I73.9] Atherosclerosis of artery of extremity with ulceration ([place]) [I70.299, L97.909]  HPI:   This is a 88 y.o. male  referred to clinic for evaluation of right foot ulceration.  The patient was seen remotely in the past by partner Dr. Carlis Abbott for the same.  He underwent angiography, and ultimately elected to pursue intervention at [facility] Dr. Nyoka Cowden performed a right superficial femoral to posterior tibial artery bypass with spliced greater saphenous vein.  This remained patent for a short time allowing his first digit to heal.  He then developed second digit ulceration.  The bypass was found to be thrombosed.  He did local wound care which ultimately was able to heal the toe.  Over the past several weeks he has developed new, worsening ulceration of the second toe.  He presents today at the request of his podiatrist for vascular evaluation.   02/01/22: Patient returns after angiogram to review findings and my recommendations. He is amenable to proceeding with bypass.   Hospital Course:  The patient was admitted to the hospital and taken to the operating room on 02/03/2022 and underwent: 1) right common femoral - plantar posterior tibial artery bypass in subcutaneous tunnel (59mm ePTFE) 2) right third toe amputation    Findings:  common femoral artery healthy. Posterior tibial artery at the foot healthy. Two pieces of 82mm ePTFE sewn together in counter incision in proximal calf to create enough conduit to reach the foot. Good technical result from bypass. Nearly triphasic flow heard in the posterior tibial artery at completion. Unremarkable third toe amputation.   The pt tolerated the procedure well and was transported to the PACU in good condition.    By POD 1, pt doing well with palpable graft pulse and + doppler signal right PT.  Pt did have elevate creatinine and this did improve.  POD 2, dressing on foot removed.  He had good doppler flow at the ankle.  He is to remain non weight bearing.    POD 3, pt doing well.  Acute blood loss anemia improved.  Wound cx with Enterobacter.  Pt was started on abx.   POD 4, pt doing well with brisk doppler flow right foot.  Incisions healing nicely.  Pt will continue NWB and is safe for dc home with HHPT.  He will maintain asa/DOAC/statin.  He was given one week of Cipro.    CBC    Component Value Date/Time   WBC 13.2 (H) 02/06/2022 0047   RBC 5.01 02/06/2022 0047   HGB 12.3 (L) 02/06/2022 0047   HGB 14.0 06/17/2020 1648   HGB 13.6 11/06/2015 1013   HCT 35.3 (L) 02/06/2022 0047   HCT 44.1 06/17/2020 1648   HCT 38.9 11/06/2015 1013   PLT 282 02/06/2022 0047   PLT 196 06/17/2020 1648   MCV 70.5 (L) 02/06/2022 0047   MCV 81 06/17/2020 1648   MCV 76.4 (L) 11/06/2015 1013   MCH 24.6 (L) 02/06/2022 0047   MCHC 34.8 02/06/2022 0047   RDW 15.1 02/06/2022 0047   RDW 16.1 (H) 06/17/2020 1648   RDW 15.3 (H) 11/06/2015 1013   LYMPHSABS 1.0 07/02/2021 0203   LYMPHSABS 3.5 (H) 11/06/2015 1013   MONOABS 0.5 07/02/2021 0203   MONOABS 0.9 11/06/2015 1013   EOSABS 0.0 07/02/2021 0203  EOSABS 0.6 (H) 11/06/2015 1013   BASOSABS 0.0 07/02/2021 0203   BASOSABS 0.0 11/06/2015 1013    BMET    Component Value Date/Time   NA 138 02/06/2022 0047   NA 140 11/29/2021 1624   NA 138 11/06/2015 1013   K 3.8 02/06/2022 0047   K 4.2 11/06/2015 1013   CL 107 02/06/2022 0047   CO2 19 (L) 02/06/2022 0047   CO2 19 (L) 11/06/2015 1013   GLUCOSE 155 (H) 02/06/2022 0047   GLUCOSE 116 11/06/2015 1013   BUN 33 (H) 02/06/2022 0047   BUN 33 (H) 11/29/2021 1624   BUN 23.5 11/06/2015 1013   CREATININE 1.49 (H) 02/06/2022 0047   CREATININE 1.3 11/06/2015 1013   CALCIUM 9.0 02/06/2022 0047   CALCIUM 9.4  11/06/2015 1013   GFRNONAA 45 (L) 02/06/2022 0047   GFRNONAA 54 (L) 09/23/2015 1037   GFRAA 49 (L) 09/17/2018 1406   GFRAA 62 09/23/2015 1037     Discharge Instructions     Discharge patient   Complete by: As directed    Discharge pt once [place] needs have been arranged and has meds from [place].  Thanks   Discharge disposition: 01-Home or Self Care   Discharge patient date: 02/07/2022       Discharge Diagnosis:  PAD (peripheral artery disease) ([place]) [I73.9] Atherosclerosis of artery of extremity with ulceration ([place]) [I70.299, L97.909]  Secondary Diagnosis: Patient Active Problem List   Diagnosis Date Noted   PAD (peripheral artery disease) ([place]) 02/03/2022   Atherosclerosis of artery of extremity with ulceration ([place]) 02/03/2022   Acute respiratory failure with hypoxia ([name]) 07/01/2021   CAP (community acquired pneumonia) 07/01/2021   History of amputation of right great toe ([place]) 02/14/2020   History of amputation of lesser toe, right ([place]) 02/14/2020   Partial nontraumatic amputation of right foot ([place]) 07/09/2019   Grief 03/15/2019   Peripheral artery disease ([place]) 03/15/2019   Acute osteomyelitis of toe, right ([place]) 01/15/2019   Hearing loss 11/28/2018   Osteoarthrosis, unspecified whether generalized or localized, other specified sites 11/28/2018   Other ill-defined and unknown causes of morbidity and mortality 11/28/2018   Pain in soft tissues of limb 11/28/2018   Hypomagnesemia 04/27/2018   Hyperkalemia 04/27/2018   Acute kidney injury (nontraumatic) (HCC)    CKD (chronic kidney disease) stage 3, GFR 30-59 ml/min (HCC) 01/04/2018   Seasonal allergies 06/30/2017   Osteoarthritis of both knees 11/16/2015   COPD exacerbation ([place]) 01/26/2015   Left hip pain 12/12/2014   Hoarseness 08/13/2014   Dry skin dermatitis 04/16/2014   History of anemia 04/07/2014   Type 2 diabetes mellitus with peripheral neuropathy ([place]) 03/21/2014   Fatigue 02/17/2014   Sacral  nerve root compression 06/07/2011   Herniated nucleus pulposus, L4-5 12/24/2010   Essential hypertension 05/07/2010   Chronic obstructive pulmonary disease ([place]) 03/20/2007   Gout 01/15/2007   Asthma 01/15/2007   Past Medical History:  Diagnosis Date   ANEMIA, IRON DEF, NOS 01/15/2007   Qualifier: Diagnosis of  By: Jeannine Kitten MD, Matthew     Arthritis    Asthma    Cataract extraction status of left eye 2016   Cataract extraction status of right eye 2016   CHOLECYSTECTOMY, LAPAROSCOPIC, HX OF 04/12/2007   Qualifier: Diagnosis of  By: Netty Starring  MD, Lucianne Muss     COPD (chronic obstructive pulmonary disease) ([place])    Critical lower limb ischemia ([place]) 10/02/2018   Diabetes mellitus without complication ([place])    Gout 2007  Hypertension    Neuromuscular disorder ([place])    Neuropathy   Neuropathy    bilateral feet   Pneumonia    Toe ulcer ([place]) 09/20/2018     Allergies as of 02/07/2022   No Known Allergies      Medication List     TAKE these medications    albuterol 108 (90 Base) MCG/ACT inhaler Commonly known as: VENTOLIN HFA Inhale 2 puffs into the lungs every 6 (six) hours as needed for wheezing or shortness of breath.   albuterol (2.5 MG/3ML) 0.083% nebulizer solution Commonly known as: PROVENTIL Take 3 mLs (2.5 mg total) by nebulization every 6 (six) hours as needed for wheezing or shortness of breath.   allopurinol 100 MG tablet Commonly known as: ZYLOPRIM Take 1 tablet (100 mg total) by mouth daily. What changed: how much to take   aspirin EC 81 MG tablet Take 81 mg by mouth daily.   atorvastatin 40 MG tablet Commonly known as: LIPITOR Take 1 tablet (40 mg total) by mouth daily.   ciprofloxacin 500 MG tablet Commonly known as: Cipro Take 1 tablet (500 mg total) by mouth 2 (two) times daily for 7 days.   cycloSPORINE 0.05 % ophthalmic emulsion Commonly known as: RESTASIS Place 1 drop into both eyes 2 (two) times daily. What changed:  when to take  this reasons to take this   diclofenac Sodium 1 % Gel Commonly known as: VOLTAREN TAKE ON AFFECTED AREAS AS NEEDED FOR PAIN UP TO 4 TIMES A DAY   Eliquis 5 MG Tabs tablet Generic drug: apixaban Take 1 tablet (5 mg total) by mouth 2 (two) times daily.   gabapentin 300 MG capsule Commonly known as: NEURONTIN TAKE 1 CAPSULE BY MOUTH FOUR TIMES A DAY What changed: See the new instructions.   Jardiance 25 MG Tabs tablet Generic drug: empagliflozin TAKE 1 TABLET (25 MG TOTAL) BY MOUTH DAILY.   levocetirizine 5 MG tablet Commonly known as: XYZAL TAKE 1 TABLET BY MOUTH TWICE A DAY What changed:  how much to take when to take this   multivitamin with minerals tablet Take 1 tablet by mouth daily.   oxyCODONE-acetaminophen 5-325 MG tablet Commonly known as: Percocet Take 1 tablet by mouth every 8 (eight) hours as needed for severe pain.   tiotropium 18 MCG inhalation capsule Commonly known as: SPIRIVA INHALE 1 CAPSULE VIA HANDIHALER ONCE DAILY AT THE SAME TIME EVERY DAY   triamcinolone cream 0.1 % Commonly known as: KENALOG Apply topically as needed (Skin itching).   Wixela Inhub 250-50 MCG/ACT Aepb Generic drug: fluticasone-salmeterol INHALE 1 PUFF INTO THE LUNGS 2 (TWO) TIMES DAILY. IN THE MORNING AND AT BEDTIME.        Discharge Instructions: [facility]   Discharge instructions   Lower Extremity Bypass Surgery   Please refer to the following instruction for your post-procedure care. Your surgeon or physician assistant will discuss any changes with you.   Activity   You are encouraged to walk as much as you can. You can slowly return to normal activities during the month after your surgery. Avoid strenuous activity and heavy lifting until your doctor tells you it's OK. Avoid activities such as vacuuming or swinging a golf club. Do not drive until your doctor give the OK and you are no longer taking prescription pain medications. It  is also normal to have difficulty with sleep habits, eating and bowel movement after surgery. These will go away with time. Continue non weight  bearing on the right foot to help promote healing of wounds.    Bathing/Showering   Shower daily after you go home. Do not soak in a bathtub, hot tub, or swim until the incision heals completely.   Incision Care   Clean your incision with mild soap and water. Shower every day. Pat the area dry with a clean towel. You do not need a bandage unless otherwise instructed. Do not apply any ointments or creams to your incision. If you have open wounds you will be instructed how to care for them or a visiting nurse may be arranged for you. If you have staples or sutures along your incision they will be removed at your post-op appointment. You may have skin glue on your incision. Do not peel it off. It will come off on its own in about one week.   Wash the groin wound with soap and water daily and pat dry. (No tub bath-only shower)  Then put a dry gauze or washcloth in the groin to keep this area dry to help prevent wound infection.  Do this daily and as needed.  Do not use Vaseline or neosporin on your incisions.  Only use soap and water on your incisions and then protect and keep dry.   Okay to clean right foot incision with soap and water daily and then dry and place dry dressing back on foot with gauze, kerlix wrap and ace wrap.  Do not wrap ace wrap too tight - it is only to use to keep dressing in place.    Diet   Resume your normal diet. There are no special food restrictions following this procedure. A low fat/ low cholesterol diet is recommended for all patients with vascular disease. In order to heal from your surgery, it is CRITICAL to get adequate nutrition. Your body requires vitamins, minerals, and protein. Vegetables are the best source of vitamins and minerals. Vegetables also provide the perfect balance of protein. Processed food has little  nutritional value, so try to avoid this.   Medications   Resume taking all your medications unless your doctor or physician assistant tells you not to. If your incision is causing pain, you may take over-the-counter pain relievers such as acetaminophen (Tylenol). If you were prescribed a stronger pain medication, please aware these medication can cause nausea and constipation. Prevent nausea by taking the medication with a snack or meal. Avoid constipation by drinking plenty of fluids and eating foods with high amount of fiber, such as fruits, vegetables, and grains. Take Colace 100 mg (an over-the-counter stool softener) twice a day as needed for constipation.  Do not take Tylenol if you are taking prescription pain medications.   Follow Up   Our office will schedule a follow up appointment 2-3 weeks following discharge.   Please call us immediately for any of the following conditions   Severe or worsening pain in your legs or feet while at rest or while walking Increase pain, redness, warmth, or drainage (pus) from your incision site(s) Fever of 101 degree or higher The swelling in your leg with the bypass suddenly worsens and becomes more painful than when you were in the hospital If you have been instructed to feel your graft pulse then you should do so every day. If you can no longer feel this pulse, call the office immediately. Not all patients are given this instruction.   Leg swelling is common after leg bypass surgery.   The swelling should improve over a  few months following surgery. To improve the swelling, you may elevate your legs above the level of your heart while you are sitting or resting. Your surgeon or physician assistant may ask you to apply an ACE wrap or wear compression (TED) stockings to help to reduce swelling.   Reduce your risk of vascular disease   Stop smoking. If you would like help call [facility] at 1-800-QUIT-NOW 731-127-4065) or [place] at  [phone].   Manage your cholesterol Maintain a desired weight Control your diabetes weight Control your diabetes Keep your blood pressure down   If you have any questions, please call the office at [phone]   Prescriptions given: 1.  Roxicet #15 No Refill 2.  Eliquis 5mg  bid 3.  Cipro 500mg  bid x 7 days  Disposition: home with HHPT/OT  Patient's condition: is Good  Follow up: 1. [facility] in 2-3 weeks   Leontine Locket, PA-C [facility] [phone] 02/09/2022  7:48 AM  - For VQI Registry use ---   Post-op:  Wound infection: No  Graft infection: No  Transfusion: No    If yes, n/a units given New Arrhythmia: No Ipsilateral amputation: No, [ ]  Minor, [ ]  BKA, [ ]  AKA Discharge patency: [x ] Primary, [ ]  Primary assisted, [ ]  Secondary, [ ]  Occluded Patency judged by: [x ] Dopper only, [ ]  Palpable graft pulse, []  Palpable distal pulse, [ ]  ABI inc. > 0.15, [ ]  Duplex Discharge ABI: R not done, L  D/C Ambulatory Status: ambulatory but non weight bearing right foot  Complications: MI: No, [ ]  Troponin only, [ ]  EKG or Clinical CHF: No Resp failure:No, [ ]  Pneumonia, [ ]  Ventilator Chg in renal function: No, [ ]  Inc. Cr > 0.5, [ ]  Temp. Dialysis,  [ ]  Permanent dialysis Stroke: No, [ ]  Minor, [ ]  Major Return to OR: No  Reason for return to OR: [ ]  Bleeding, [ ]  Infection, [ ]  Thrombosis, [ ]  Revision  Discharge medications: Statin use:  yes ASA use:  yes Plavix use:  no Beta blocker use: no CCB use:  No ACEI use:   no ARB use:  no Coumadin use: no Eliquis: yes

## 2022-02-07 NOTE — TOC Transition Note (Addendum)
Transition of Care (TOC) - CM/SW Discharge Note Marvetta Gibbons RN, BSN Transitions of Care Unit 4E- RN Case Manager See Treatment Team for direct phone #   Patient Details  Name: [name] MRN: [id number] Date of Birth: [birth year]  Transition of Care [facility]) CM/SW Contact:  Dawayne Patricia, RN Phone Number: 02/07/2022, 11:35 AM   Clinical Narrative:    Pt stable for transition home, [place] and DME has been arranged.  CM notified by Latricia Heft -they are following patient with MD office protocol referral prearranged for [facility] needs- Lattie Haw with [employer] notified of discharge and will follow up to schedule for [facility] needs.   DME- wheelchair and BSC-w/ drop arm arranged- per [employer] daughter has requested that DME be delivered to the home. Daughter reports that pt has RW at home   No further TOC needs noted.  Daughter to transport home  Final next level of care: [place] Barriers to Discharge: Barriers Resolved   Patient Goals and CMS Choice Patient states their goals for this hospitalization and ongoing recovery are:: return home CMS Medicare.gov Compare Post Acute Care list provided to:: Patient Choice offered to / list presented to : Patient  Discharge Placement                 Home w/ [facility]      Discharge Plan and Services   Discharge Planning Services: CM Consult Post Acute Care Choice: Durable Medical Equipment, Home Health          DME Arranged: Bedside commode, Wheelchair manual DME Agency: [employer] Date DME Agency Contacted: 02/04/22 Time DME Agency Contacted: 1500 Representative spoke with at DME Agency: Erasmo Downer HH Arranged: RN, PT, OT [facility] Agency: [place] Date [place]: 02/04/22   Representative spoke with at [place]: [place] ([place]) Interventions     Readmission Risk Interventions    02/07/2022   11:35 AM  Readmission Risk Prevention Plan  Transportation Screening Complete  PCP or  Specialist Appt within 5-7 Days Complete  Home Care Screening Complete  Medication Review (RN CM) Complete

## 2022-02-07 NOTE — Care Management Important Message (Signed)
Important Message  Patient Details  Name: [name] MRN: [id number] Date of Birth: [birth year]   Medicare Important Message Given:  Yes     [name] 02/07/2022, 11:26 AM

## 2022-02-07 NOTE — Progress Notes (Addendum)
Progress Note    02/07/2022 6:51 AM 4 Days Post-Op  Subjective:  no complaints  Afebrile HR 90's-100's  505'L-976'B systolic 34% RA  Vitals:   02/06/22 2205 02/07/22 0300  BP: 133/89 124/79  Pulse: 91 94  Resp: 16 17  Temp: 98 F (36.7 C) 98 F (36.7 C)  SpO2: 95% 95%    Physical Exam: General:  resting comfortably Cardiac:  regular Lungs:  non labored Incisions:  right groin and right below knee incisions look good with steri strips in place Extremities:  brisk right PT/AT doppler flow; faint peroneal signal present   CBC    Component Value Date/Time   WBC 13.2 (H) 02/06/2022 0047   RBC 5.01 02/06/2022 0047   HGB 12.3 (L) 02/06/2022 0047   HGB 14.0 06/17/2020 1648   HGB 13.6 11/06/2015 1013   HCT 35.3 (L) 02/06/2022 0047   HCT 44.1 06/17/2020 1648   HCT 38.9 11/06/2015 1013   PLT 282 02/06/2022 0047   PLT 196 06/17/2020 1648   MCV 70.5 (L) 02/06/2022 0047   MCV 81 06/17/2020 1648   MCV 76.4 (L) 11/06/2015 1013   MCH 24.6 (L) 02/06/2022 0047   MCHC 34.8 02/06/2022 0047   RDW 15.1 02/06/2022 0047   RDW 16.1 (H) 06/17/2020 1648   RDW 15.3 (H) 11/06/2015 1013   LYMPHSABS 1.0 07/02/2021 0203   LYMPHSABS 3.5 (H) 11/06/2015 1013   MONOABS 0.5 07/02/2021 0203   MONOABS 0.9 11/06/2015 1013   EOSABS 0.0 07/02/2021 0203   EOSABS 0.6 (H) 11/06/2015 1013   BASOSABS 0.0 07/02/2021 0203   BASOSABS 0.0 11/06/2015 1013    BMET    Component Value Date/Time   NA 138 02/06/2022 0047   NA 140 11/29/2021 1624   NA 138 11/06/2015 1013   K 3.8 02/06/2022 0047   K 4.2 11/06/2015 1013   CL 107 02/06/2022 0047   CO2 19 (L) 02/06/2022 0047   CO2 19 (L) 11/06/2015 1013   GLUCOSE 155 (H) 02/06/2022 0047   GLUCOSE 116 11/06/2015 1013   BUN 33 (H) 02/06/2022 0047   BUN 33 (H) 11/29/2021 1624   BUN 23.5 11/06/2015 1013   CREATININE 1.49 (H) 02/06/2022 0047   CREATININE 1.3 11/06/2015 1013   CALCIUM 9.0 02/06/2022 0047   CALCIUM 9.4 11/06/2015 1013   GFRNONAA 45  (L) 02/06/2022 0047   GFRNONAA 54 (L) 09/23/2015 1037   GFRAA 49 (L) 09/17/2018 1406   GFRAA 62 09/23/2015 1037    INR    Component Value Date/Time   INR 1.1 02/03/2022 0831     Intake/Output Summary (Last 24 hours) at 02/07/2022 0651 Last data filed at 02/07/2022 0300 Gross per 24 hour  Intake 457 ml  Output 2150 ml  Net -1693 ml      Assessment/Plan:  88 y.o. male is s/p:  R CF to distal posterior tibial artery bypass with PTFE and 3rd toe amputation    4 Days Post-Op   -pt doing well this morning with brisk doppler flow to right foot.   -incisions are healing nicely -pt is non weight bearing.  He does not think he will be able to use a wheelchair at his house.  Will discuss with Dr. Stanford Breed if pt can be partial weight bearing with heel only.   -possibly home later today. -DVT prophylaxis:  sq heparin   Leontine Locket, PA-C [facility] [phone] 02/07/2022 6:51 AM   VASCULAR STAFF ADDENDUM: I have independently interviewed and examined the patient.  I agree with the above.  Patient safe for discharge today. ASA / DOAC / Statin. Mobilize as able. NWB to RLE. Safe for DC with [facility] PT.  Yevonne Aline. Stanford Breed, MD [facility] Phone Number: [phone] 02/07/2022 9:25 AM

## 2022-02-07 NOTE — Progress Notes (Signed)
Rowe Clack RN explained discharge instructions to patient. Reviewed follow up appointment and next medication administration times. Also reviewed education. Patient verbalized having an understanding for instructions given. All belongings TOC meds and equi[ment to be delivered.  IV and telemetry were removed. CCMD was notified. No other needs verbalized. Awaiting daughter to transport home.

## 2022-02-08 ENCOUNTER — Telehealth: Payer: Self-pay

## 2022-02-08 DIAGNOSIS — Z89421 Acquired absence of other right toe(s): Secondary | ICD-10-CM | POA: Diagnosis not present

## 2022-02-08 DIAGNOSIS — J45909 Unspecified asthma, uncomplicated: Secondary | ICD-10-CM | POA: Diagnosis not present

## 2022-02-08 DIAGNOSIS — Z95820 Peripheral vascular angioplasty status with implants and grafts: Secondary | ICD-10-CM | POA: Diagnosis not present

## 2022-02-08 DIAGNOSIS — Z4781 Encounter for orthopedic aftercare following surgical amputation: Secondary | ICD-10-CM | POA: Diagnosis not present

## 2022-02-08 DIAGNOSIS — Z48812 Encounter for surgical aftercare following surgery on the circulatory system: Secondary | ICD-10-CM | POA: Diagnosis not present

## 2022-02-08 DIAGNOSIS — I70291 Other atherosclerosis of native arteries of extremities, right leg: Secondary | ICD-10-CM | POA: Diagnosis not present

## 2022-02-08 DIAGNOSIS — I739 Peripheral vascular disease, unspecified: Secondary | ICD-10-CM | POA: Diagnosis not present

## 2022-02-08 DIAGNOSIS — E1140 Type 2 diabetes mellitus with diabetic neuropathy, unspecified: Secondary | ICD-10-CM | POA: Diagnosis not present

## 2022-02-08 DIAGNOSIS — I10 Essential (primary) hypertension: Secondary | ICD-10-CM | POA: Diagnosis not present

## 2022-02-08 DIAGNOSIS — J449 Chronic obstructive pulmonary disease, unspecified: Secondary | ICD-10-CM | POA: Diagnosis not present

## 2022-02-08 NOTE — Patient Outreach (Signed)
  Care Coordination TOC Note Transition Care Management Follow-up Telephone Call Date of discharge and from where: [facility] 02/04/23-02/07/22 How have you been since you were released from the hospital? "I feel alright" Any questions or concerns? Yes- Patient notes he is not sure why he got a wheelchair delivered today.  He feels he does not need one.  Items Reviewed: Did the pt receive and understand the discharge instructions provided? Yes  Medications obtained and verified? Yes  Other? No  Any new allergies since your discharge? Yes  Dietary orders reviewed? Yes Do you have support at home? Yes   Home Care and Equipment/Supplies: Were home health services ordered? yes If so, what is the name of the agency? [facility]  Has the agency set up a time to come to the patient's home? yes Were any new equipment or medical supplies ordered?  Yes: wheelchair and BSC What is the name of the medical supply agency? [employer] Were you able to get the supplies/equipment? yes Do you have any questions related to the use of the equipment or supplies? No  Functional Questionnaire: (I = Independent and D = Dependent) ADLs: Needs some assistance  Bathing/Dressing- needs assistance  Meal Prep- D  Eating- I  Maintaining continence- I  Transferring/Ambulation- needs assistance  Managing Meds- I  Follow up appointments reviewed:  PCP Hospital f/u appt confirmed? No   Specialist Hospital f/u appt confirmed? No   Are transportation arrangements needed? No  If their condition worsens, is the pt aware to call PCP or go to the Emergency Dept.? Yes Was the patient provided with contact information for the PCP's office or ED? Yes Was to pt encouraged to call back with questions or concerns? Yes  SDOH assessments and interventions completed:   Yes SDOH Interventions Today    Flowsheet Row Most Recent Value  SDOH Interventions   Housing Interventions Intervention Not Indicated   Transportation Interventions Intervention Not Indicated       Care Coordination Interventions:  PCP follow up appointment requested   Encounter Outcome:  Pt. Visit Completed

## 2022-02-09 DIAGNOSIS — I739 Peripheral vascular disease, unspecified: Secondary | ICD-10-CM | POA: Diagnosis not present

## 2022-02-09 DIAGNOSIS — Z89421 Acquired absence of other right toe(s): Secondary | ICD-10-CM | POA: Diagnosis not present

## 2022-02-09 LAB — AEROBIC/ANAEROBIC CULTURE W GRAM STAIN (SURGICAL/DEEP WOUND): Gram Stain: NONE SEEN

## 2022-02-10 ENCOUNTER — Other Ambulatory Visit: Payer: Self-pay | Admitting: Family Medicine

## 2022-02-10 ENCOUNTER — Ambulatory Visit: Payer: PPO | Admitting: Podiatry

## 2022-02-10 DIAGNOSIS — I70299 Other atherosclerosis of native arteries of extremities, unspecified extremity: Secondary | ICD-10-CM | POA: Diagnosis not present

## 2022-02-10 DIAGNOSIS — L97909 Non-pressure chronic ulcer of unspecified part of unspecified lower leg with unspecified severity: Secondary | ICD-10-CM | POA: Diagnosis not present

## 2022-02-10 DIAGNOSIS — I739 Peripheral vascular disease, unspecified: Secondary | ICD-10-CM | POA: Diagnosis not present

## 2022-02-10 DIAGNOSIS — E1142 Type 2 diabetes mellitus with diabetic polyneuropathy: Secondary | ICD-10-CM

## 2022-02-14 ENCOUNTER — Ambulatory Visit: Payer: PPO | Admitting: Podiatry

## 2022-02-14 ENCOUNTER — Other Ambulatory Visit: Payer: Self-pay | Admitting: *Deleted

## 2022-02-14 ENCOUNTER — Telehealth: Payer: Self-pay

## 2022-02-14 DIAGNOSIS — I739 Peripheral vascular disease, unspecified: Secondary | ICD-10-CM

## 2022-02-14 DIAGNOSIS — I70235 Atherosclerosis of native arteries of right leg with ulceration of other part of foot: Secondary | ICD-10-CM

## 2022-02-14 NOTE — Telephone Encounter (Signed)
Pt's daughter, Nani Skillern, called rudely stating that "she has called before".  Reviewed pt's chart, no communications documented related to pt's daughter. DPR is only for pt's wife. Called pt directly, two identifiers used. Informed him that Elmo Putt had called, but that she was not on DPR. If he wanted her to be informed, he would need to fill out another DPR when he comes back into this office. He stated that he took the last antibiotic today and wanted to know if he was supposed to continue. Informed him that he was only given a 7 day supply, so when he took the last one, he was done with the course of treatment. He stated that he was not having any issues, HH to change dressing tomorrow, and he was aware of his appt on 12/19. Encouraged pt to call daughter to make her aware of DPR and that he was called to ensure he had no issues. Confirmed understanding.

## 2022-02-16 DIAGNOSIS — T8189XA Other complications of procedures, not elsewhere classified, initial encounter: Secondary | ICD-10-CM | POA: Diagnosis not present

## 2022-02-21 ENCOUNTER — Ambulatory Visit: Payer: PPO | Admitting: Podiatry

## 2022-02-22 ENCOUNTER — Ambulatory Visit (INDEPENDENT_AMBULATORY_CARE_PROVIDER_SITE_OTHER): Payer: PPO | Admitting: Physician Assistant

## 2022-02-22 ENCOUNTER — Ambulatory Visit (HOSPITAL_COMMUNITY)
Admission: RE | Admit: 2022-02-22 | Discharge: 2022-02-22 | Disposition: A | Discharge status: Home / Self Care | Payer: PPO | Source: Ambulatory Visit | Attending: Vascular Surgery | Admitting: Vascular Surgery

## 2022-02-22 ENCOUNTER — Encounter: Payer: Self-pay | Admitting: Family Medicine

## 2022-02-22 ENCOUNTER — Ambulatory Visit (INDEPENDENT_AMBULATORY_CARE_PROVIDER_SITE_OTHER): Payer: PPO | Admitting: Family Medicine

## 2022-02-22 VITALS — BP 136/80 | HR 118 | Temp 97.5°F | Ht 71.0 in | Wt 174.6 lb

## 2022-02-22 VITALS — BP 118/78 | HR 101 | Temp 97.0°F | Resp 18 | Ht 71.0 in | Wt 173.8 lb

## 2022-02-22 DIAGNOSIS — I739 Peripheral vascular disease, unspecified: Secondary | ICD-10-CM | POA: Insufficient documentation

## 2022-02-22 DIAGNOSIS — E1142 Type 2 diabetes mellitus with diabetic polyneuropathy: Secondary | ICD-10-CM | POA: Diagnosis not present

## 2022-02-22 DIAGNOSIS — I70235 Atherosclerosis of native arteries of right leg with ulceration of other part of foot: Secondary | ICD-10-CM | POA: Insufficient documentation

## 2022-02-22 LAB — POCT GLYCOSYLATED HEMOGLOBIN (HGB A1C): HbA1c, POC (controlled diabetic range): 8.1 % — AB (ref 0.0–7.0)

## 2022-02-22 NOTE — Progress Notes (Signed)
    SUBJECTIVE:   CHIEF COMPLAINT / HPI:   Patient presents for hospital follow up. Was hospitalized from 11/30-12/6   Hospital Course:  The patient was admitted to the hospital and taken to the operating room on 02/03/2022 and underwent: 1) right common femoral - plantar posterior tibial artery bypass in subcutaneous tunnel (43mm ePTFE) 2) right third toe amputation     Findings:  common femoral artery healthy. Posterior tibial artery at the foot healthy. Two pieces of 22mm ePTFE sewn together in counter incision in proximal calf to create enough conduit to reach the foot. Good technical result from bypass. Nearly triphasic flow heard in the posterior tibial artery at completion. Unremarkable third toe amputation. The pt tolerated the procedure well and was transported to the PACU in good condition.    By POD 1, pt doing well with palpable graft pulse and + doppler signal right PT.  Pt did have elevate creatinine and this did improve. POD 2, dressing on foot removed.  He had good doppler flow at the ankle.  He is to remain non weight bearing.   POD 3, pt doing well.  Acute blood loss anemia improved.  Wound cx with Enterobacter.  Pt was started on abx. POD 4, pt doing well with brisk doppler flow right foot.  Incisions healing nicely.  Pt will continue NWB and is safe for dc home with HHPT.  He will maintain asa/DOAC/statin.  He was given one week of Cipro.   Patient presents with his daughter and is ambulating with a walker and has post op shoe on. They both endorse that he is doing well after his hospitalization. Home health care nurse has been coming several times a week to help with post op care. He is still taking his ASA, Eliquis, statin. Has follow up with vascular team on 1/19   PERTINENT  PMH / PSH: Reviewed   OBJECTIVE:   BP 136/80   Pulse (!) 118   Temp (!) 97.5 F (36.4 C)   Ht 5\' 11"  (1.803 m)   Wt 174 lb 9.6 oz (79.2 kg)   SpO2 96%   BMI 24.35 kg/m    Physical  exam General: well appearing, NAD Cardiovascular: Tachycardic, no murmurs Lungs: CTAB. Normal WOB Abdomen: soft, non-distended Skin: warm, dry. Right foot in post op shoe with clean bandage and ace wrap in place. Able to visualize amputation under dressing which appeared to be healing well without erythema, bleeding or drainage.  ASSESSMENT/PLAN:   PAD (peripheral artery disease) ([place]) Patient was hospitalized from 11/30- 12/6 for 3rd R toe ampuation and right common femoral - plantar posterior tibial artery bypass. He is currently recovering well. Has home health set up. Has been following up with vascular surgery and next appointment scheduled in January.    DM2 Patient requesting to check A1c. Today A1c is 8.1 which is down from 8.6 two months ago. Currently on Jardiance 25mg . Discussed not making adjustments at this time but will follow up in 3 months.    [place]

## 2022-02-22 NOTE — Progress Notes (Signed)
POST OPERATIVE OFFICE NOTE    CC:  F/u for surgery  HPI:  [name] is a 88 y.o. male who is s/p right common femoral to plantar posterior tibial artery bypass in subcutaneous tunnel with 6 mm PTFE and right third toe amputation on 02/03/2022 by Dr. Stanford Breed.  He has a history of right superficial femoral to posterior tibial artery bypass with spliced greater saphenous vein by Dr. Nyoka Cowden in 2020, which allowed him to heal a wound on his first digit.  Eventually his bypass was found to be thrombosed in 2021 and he required right first and second toe amputations for nonhealing ulcerations.  He was evaluated by our clinic this year for a nonhealing right third toe ulcer, which ultimately required bypass and amputation of the toe.  Pt returns today for follow up.  Pt states he has done well since surgery.  He denies any signs of infection or separation of his incisions.  He thinks that his right groin and lower leg incisions have healed well.  He denies any drainage or bleeding from his right third toe amputation site.  Both him and his daughter state that home health has been coming out 2-3 weekly to do dry bulky dressing changes to the right foot.  He denies any pain in the right leg or foot.  He has been trying to get around with a walker and not bearing weight on the right leg.  He is not able to use the wheelchair that was ordered for him since it would not fit in his house.   No Known Allergies  Current Outpatient Medications  Medication Sig Dispense Refill   albuterol (PROVENTIL) (2.5 MG/3ML) 0.083% nebulizer solution Take 3 mLs (2.5 mg total) by nebulization every 6 (six) hours as needed for wheezing or shortness of breath. 150 mL 1   albuterol (VENTOLIN HFA) 108 (90 Base) MCG/ACT inhaler Inhale 2 puffs into the lungs every 6 (six) hours as needed for wheezing or shortness of breath.     allopurinol (ZYLOPRIM) 100 MG tablet Take 1 tablet (100 mg total) by mouth daily. (Patient taking  differently: Take 150 mg by mouth daily.) 30 tablet 3   apixaban (ELIQUIS) 5 MG TABS tablet Take 1 tablet (5 mg total) by mouth 2 (two) times daily. 60 tablet 3   aspirin 81 MG EC tablet Take 81 mg by mouth daily.     atorvastatin (LIPITOR) 40 MG tablet Take 1 tablet (40 mg total) by mouth daily. 90 tablet 3   cycloSPORINE (RESTASIS) 0.05 % ophthalmic emulsion Place 1 drop into both eyes 2 (two) times daily. (Patient taking differently: Place 1 drop into both eyes 2 (two) times daily as needed (dry eyes).) 60 mL 3   diclofenac Sodium (VOLTAREN) 1 % GEL TAKE ON AFFECTED AREAS AS NEEDED FOR PAIN UP TO 4 TIMES A DAY 400 g 3   gabapentin (NEURONTIN) 300 MG capsule Take 1-2 capsules (300-600 mg total) by mouth See admin instructions. 300 mg in the morning, 600 mg at bedtime 120 capsule 3   JARDIANCE 25 MG TABS tablet TAKE 1 TABLET (25 MG TOTAL) BY MOUTH DAILY. 90 tablet 3   levocetirizine (XYZAL) 5 MG tablet TAKE 1 TABLET BY MOUTH TWICE A DAY (Patient taking differently: Take 10 mg by mouth every morning.) 180 tablet 3   Multiple Vitamins-Minerals (MULTIVITAMIN WITH MINERALS) tablet Take 1 tablet by mouth daily.     oxyCODONE-acetaminophen (PERCOCET) 5-325 MG tablet Take 1 tablet by mouth every 8 (  eight) hours as needed for severe pain. 15 tablet 0   tiotropium (SPIRIVA) 18 MCG inhalation capsule INHALE 1 CAPSULE VIA HANDIHALER ONCE DAILY AT THE SAME TIME EVERY DAY 30 capsule 2   triamcinolone cream (KENALOG) 0.1 % Apply topically as needed (Skin itching). 30 g 0   WIXELA INHUB 250-50 MCG/ACT AEPB INHALE 1 PUFF INTO THE LUNGS 2 (TWO) TIMES DAILY. IN THE MORNING AND AT BEDTIME. 60 each 2   No current facility-administered medications for this visit.     ROS:  See HPI  Physical Exam:  Incision: Right groin incision nearly healed.  Right popliteal incision healed and remaining 3 Steri-Strips removed.  Right medial ankle incision appropriately healing with nylon sutures.  Dehiscence of right third toe  amputation site with intact nylon sutures.  Eschar on top was removed without bleeding.  Wound bed with appropriately healthy tissue and shallow, no signs of infection or drainage Extremities:  right PT bypass with doppler signal, monophasic right PT/DP doppler signal Neuro: intact motor and sensation of RLE       Studies:  ABI (02/22/2022) +---------+------------------+-----+----------+---------+  Right   Rt Pressure (mmHg)IndexWaveform  Comment    +---------+------------------+-----+----------+---------+  Brachial 105                                         +---------+------------------+-----+----------+---------+  PTA                            monophasicCNO        +---------+------------------+-----+----------+---------+  DP                             monophasicCNO        +---------+------------------+-----+----------+---------+  Great Toe                       Absent    Amputated  +---------+------------------+-----+----------+---------+   +---------+------------------+-----+----------+-------+  Left    Lt Pressure (mmHg)IndexWaveform  Comment  +---------+------------------+-----+----------+-------+  Brachial 106                                       +---------+------------------+-----+----------+-------+  PTA     87                0.82 monophasic         +---------+------------------+-----+----------+-------+  DP      86                0.81 monophasic         +---------+------------------+-----+----------+-------+  Great Toe49                0.46 Abnormal           +---------+------------------+-----+----------+-------+   +-------+-----------+-----------+------------+------------+  ABI/TBIToday's ABIToday's TBIPrevious ABIPrevious TBI  +-------+-----------+-----------+------------+------------+  Right [place]yer         NA         0.57        0              +-------+-----------+-----------+------------+------------+  Left  0.82       0.46       0.72        0.22          +-------+-----------+-----------+------------+------------+  Assessment/Plan:  This is a 88 y.o. male who is s/p: Right common femoral to plantar posterior tibial artery bypass with PTFE and right third toe amputation on 02/03/2022  -The patient's right groin and right popliteal incisions are healing well.  Steri-Strips were removed from the right popliteal incision.  Right medial ankle incision intact with nylon sutures -Right third toe amputation site with dehiscence and eschar on top of shallow wound bed.  Eschar was removed from the wound bed with appropriately healthy tissue underneath.  No signs of infection or drainage.  Wound was also evaluated by Dr. Stanford Breed. -ABIs demonstrate noncompressible ABI on the right side.  Monophasic flow in the right PT/DP.  On exam today the patient has monophasic signals in the right PT/DP and Doppler flow in the bypass graft.  The patient is without rest pain or claudication -The patient's amputation site was dressed with Betadine, gauze, Kerlix, ABD, and Ace wrap.  Will modify HHRN orders for dry Betadine dressing changes 3x weekly to the right foot.  The patient is family will change the dressing when home health is not there. -He will follow-up with our office in 4 weeks with right bypass graft duplex study and wound check   Vicente Serene, PA-C [facility] [phone]   Clinic MD:  Roxanne Mins

## 2022-02-22 NOTE — Patient Instructions (Addendum)
It was great seeing you today!  I am glad you are doing well since leaving the hospital!  We checked your A1c today which was 8.1  You can call the clinic to see when the COVID vaccine is in and schedule a nurse visit just to get the shot.   I will see you back in 3 months for your next follow up, but if you need to be seen earlier than that for any new issues we're happy to fit you in, just give [place] a call!  Feel free to call with any questions or concerns at any time, at [phone].   Take care,  Dr. Shary Key [facility]

## 2022-02-23 NOTE — Progress Notes (Incomplete)
    SUBJECTIVE:   CHIEF COMPLAINT / HPI:   Patient presents for hospital follow up. Was hospitalized from 11/30-12/6  This is a 88 y.o. male  referred to clinic for evaluation of right foot ulceration.  The patient was seen remotely in the past by partner Dr. Carlis Abbott for the same.  He underwent angiography, and ultimately elected to pursue intervention at [facility]. Dr. Nyoka Cowden performed a right superficial femoral to posterior tibial artery bypass with spliced greater saphenous vein.  This remained patent for a short time allowing his first digit to heal.  He then developed second digit ulceration.  The bypass was found to be thrombosed.  He did local wound care which ultimately was able to heal the toe.  Over the past several weeks he has developed new, worsening ulceration of the second toe.  He presents today at the request of his podiatrist for vascular evaluation.   02/01/22: Patient returns after angiogram to review findings and my recommendations. He is amenable to proceeding with bypass.    Hospital Course:  The patient was admitted to the hospital and taken to the operating room on 02/03/2022 and underwent: 1) right common femoral - plantar posterior tibial artery bypass in subcutaneous tunnel (43mm ePTFE) 2) right third toe amputation     Findings:  common femoral artery healthy. Posterior tibial artery at the foot healthy. Two pieces of 82mm ePTFE sewn together in counter incision in proximal calf to create enough conduit to reach the foot. Good technical result from bypass. Nearly triphasic flow heard in the posterior tibial artery at completion. Unremarkable third toe amputation.    The pt tolerated the procedure well and was transported to the PACU in good condition.    By POD 1, pt doing well with palpable graft pulse and + doppler signal right PT.  Pt did have elevate creatinine and this did improve.   POD 2, dressing on foot removed.  He had good doppler flow at the ankle.   He is to remain non weight bearing.     POD 3, pt doing well.  Acute blood loss anemia improved.  Wound cx with Enterobacter.  Pt was started on abx.   POD 4, pt doing well with brisk doppler flow right foot.  Incisions healing nicely.  Pt will continue NWB and is safe for dc home with HHPT.  He will maintain asa/DOAC/statin.  He was given one week of Cipro.   Added Eliquis   Has follow up with vascular team on 1/19   PERTINENT  PMH / PSH: Reviewed   OBJECTIVE:   There were no vitals taken for this visit.   Physical exam General: well appearing, NAD Cardiovascular: RRR, no murmurs Lungs: CTAB. Normal WOB Abdomen: soft, non-distended, non-tender Skin: warm, dry. No edema  ASSESSMENT/PLAN:   No problem-specific Assessment & Plan notes found for this encounter.   PAD s/p bypass  3rd R Toe ampuation   Shary Key, [place]

## 2022-02-24 NOTE — Assessment & Plan Note (Signed)
Patient was hospitalized from 11/30- 12/6 for 3rd R toe ampuation and right common femoral - plantar posterior tibial artery bypass. He is currently recovering well. Has home health set up. Has been following up with vascular surgery and next appointment scheduled in January.

## 2022-02-24 NOTE — Assessment & Plan Note (Signed)
Patient requesting to check A1c. Today A1c is 8.1 which is down from 8.6 two months ago. Currently on Jardiance 25mg . Discussed not making adjustments at this time but will follow up in 3 months.

## 2022-02-25 ENCOUNTER — Other Ambulatory Visit: Payer: Self-pay

## 2022-02-25 DIAGNOSIS — I70235 Atherosclerosis of native arteries of right leg with ulceration of other part of foot: Secondary | ICD-10-CM

## 2022-03-01 ENCOUNTER — Other Ambulatory Visit: Payer: Self-pay

## 2022-03-01 DIAGNOSIS — I739 Peripheral vascular disease, unspecified: Secondary | ICD-10-CM

## 2022-03-01 DIAGNOSIS — I70235 Atherosclerosis of native arteries of right leg with ulceration of other part of foot: Secondary | ICD-10-CM

## 2022-03-04 ENCOUNTER — Other Ambulatory Visit (HOSPITAL_COMMUNITY): Payer: Self-pay

## 2022-03-09 DIAGNOSIS — T8189XA Other complications of procedures, not elsewhere classified, initial encounter: Secondary | ICD-10-CM | POA: Diagnosis not present

## 2022-03-10 ENCOUNTER — Telehealth: Payer: Self-pay

## 2022-03-10 NOTE — Telephone Encounter (Addendum)
Pt's daughter, Nani Skillern, called stating that they still have the wheelchair and it needs to be picked up.  Reviewed pt's chart, returned call for clarification, two identifiers used. She stated that she called the [facility] agency and they informed her that they couldn't pick up the wheelchair until the provider gave the ok. Last office visit states that the pt never used the wheelchair because it doesn't fit in his house. Asked which agency and she could not remember. Instructed her to locate name & number, call back with info. Confirmed understanding.  Called back with information. [place]. The rep stated that there was no need for a doctor to ok pick up, it just needed a pick up ticket to be done. She got pt information to call pt/daughter and get it processed. Confirmed understanding.

## 2022-03-11 DIAGNOSIS — Z89421 Acquired absence of other right toe(s): Secondary | ICD-10-CM | POA: Diagnosis not present

## 2022-03-11 DIAGNOSIS — I739 Peripheral vascular disease, unspecified: Secondary | ICD-10-CM | POA: Diagnosis not present

## 2022-03-11 DIAGNOSIS — Z95820 Peripheral vascular angioplasty status with implants and grafts: Secondary | ICD-10-CM | POA: Diagnosis not present

## 2022-03-11 DIAGNOSIS — J449 Chronic obstructive pulmonary disease, unspecified: Secondary | ICD-10-CM | POA: Diagnosis not present

## 2022-03-11 DIAGNOSIS — J45909 Unspecified asthma, uncomplicated: Secondary | ICD-10-CM | POA: Diagnosis not present

## 2022-03-11 DIAGNOSIS — Z4781 Encounter for orthopedic aftercare following surgical amputation: Secondary | ICD-10-CM | POA: Diagnosis not present

## 2022-03-11 DIAGNOSIS — I10 Essential (primary) hypertension: Secondary | ICD-10-CM | POA: Diagnosis not present

## 2022-03-11 DIAGNOSIS — Z48812 Encounter for surgical aftercare following surgery on the circulatory system: Secondary | ICD-10-CM | POA: Diagnosis not present

## 2022-03-11 DIAGNOSIS — I70291 Other atherosclerosis of native arteries of extremities, right leg: Secondary | ICD-10-CM | POA: Diagnosis not present

## 2022-03-11 DIAGNOSIS — E1140 Type 2 diabetes mellitus with diabetic neuropathy, unspecified: Secondary | ICD-10-CM | POA: Diagnosis not present

## 2022-03-13 DIAGNOSIS — L97909 Non-pressure chronic ulcer of unspecified part of unspecified lower leg with unspecified severity: Secondary | ICD-10-CM | POA: Diagnosis not present

## 2022-03-13 DIAGNOSIS — I70299 Other atherosclerosis of native arteries of extremities, unspecified extremity: Secondary | ICD-10-CM | POA: Diagnosis not present

## 2022-03-13 DIAGNOSIS — I739 Peripheral vascular disease, unspecified: Secondary | ICD-10-CM | POA: Diagnosis not present

## 2022-03-22 ENCOUNTER — Encounter: Payer: Self-pay | Admitting: Family Medicine

## 2022-03-22 ENCOUNTER — Ambulatory Visit (INDEPENDENT_AMBULATORY_CARE_PROVIDER_SITE_OTHER): Payer: PPO | Admitting: Family Medicine

## 2022-03-22 VITALS — BP 128/77 | HR 97 | Temp 97.4°F | Ht 71.0 in | Wt 172.8 lb

## 2022-03-22 DIAGNOSIS — Z23 Encounter for immunization: Secondary | ICD-10-CM

## 2022-03-22 DIAGNOSIS — R300 Dysuria: Secondary | ICD-10-CM | POA: Diagnosis not present

## 2022-03-22 LAB — POCT URINALYSIS DIP (MANUAL ENTRY)
Bilirubin, UA: NEGATIVE
Glucose, UA: >=1000 mg/dL — AB
Ketones, POC UA: NEGATIVE mg/dL
Nitrite, UA: NEGATIVE
Protein Ur, POC: 100 mg/dL — AB
Spec Grav, UA: 1.015 (ref 1.010–1.025)
Urobilinogen, UA: 0.2 E.U./dL
pH, UA: 5.5 (ref 5.0–8.0)

## 2022-03-22 LAB — POCT UA - MICROSCOPIC ONLY: Epithelial cells, urine per micros: NONE SEEN

## 2022-03-22 NOTE — Patient Instructions (Addendum)
It was great seeing you today!  You came in for burning with urination and we are checking for infection. I will call you with the results and send in treatment if needed! Continue to stay well hydrated.   You also received your COVID vaccine.   Feel free to call with any questions or concerns at any time, at [phone].   Take care,  Dr. Shary Key [facility]

## 2022-03-22 NOTE — Progress Notes (Signed)
    SUBJECTIVE:   CHIEF COMPLAINT / HPI:   A couple of weeks ago was constipated and also started feeling burning with urination. He wonders if he strained his penis at that time. Denies blood in the urine. Denies penile pain at rest. Denies fever or chills. Doesn't quite notice urinary frequency. Some times can get full urination other times it drips. Denies abdominal pain. Denies current constipation. Does feel his urinary symptoms seem to be improving.    PERTINENT  PMH / PSH: Reviewed   OBJECTIVE:   BP 128/77   Pulse 97   Temp (!) 97.4 F (36.3 C)   Ht 5\' 11"  (1.803 m)   Wt 172 lb 12.8 oz (78.4 kg)   SpO2 97%   BMI 24.10 kg/m    Physical exam General: well appearing, NAD Cardiovascular: RRR, no murmurs Lungs: CTAB. Normal WOB Abdomen: soft, non-distended, non-tender Skin: warm, dry. No edema  ASSESSMENT/PLAN:   Dysuria Approx 2 weeks in duration with occasional hesitancy. Denies hematuria, fever, chills. Feels symptoms are improving. Suspect this may be a urinary tract infection. Will obtain UA and urine culture and will send antibiotics if indicated. Return precautions discussed.    Health maintenance  Received COVID vaccination   [name]

## 2022-03-24 DIAGNOSIS — H1045 Other chronic allergic conjunctivitis: Secondary | ICD-10-CM | POA: Diagnosis not present

## 2022-03-24 DIAGNOSIS — R300 Dysuria: Secondary | ICD-10-CM | POA: Insufficient documentation

## 2022-03-24 DIAGNOSIS — E119 Type 2 diabetes mellitus without complications: Secondary | ICD-10-CM | POA: Diagnosis not present

## 2022-03-24 DIAGNOSIS — Z961 Presence of intraocular lens: Secondary | ICD-10-CM | POA: Diagnosis not present

## 2022-03-24 DIAGNOSIS — H04123 Dry eye syndrome of bilateral lacrimal glands: Secondary | ICD-10-CM | POA: Diagnosis not present

## 2022-03-24 LAB — URINE CULTURE

## 2022-03-24 NOTE — Assessment & Plan Note (Addendum)
Approx 2 weeks in duration with occasional hesitancy. Denies hematuria, fever, chills. Feels symptoms are improving. Suspect this may be a urinary tract infection. Will obtain UA and urine culture and will send antibiotics if indicated. Return precautions discussed.

## 2022-03-25 ENCOUNTER — Ambulatory Visit: Payer: PPO | Admitting: Physician Assistant

## 2022-03-25 ENCOUNTER — Ambulatory Visit (INDEPENDENT_AMBULATORY_CARE_PROVIDER_SITE_OTHER)
Admission: RE | Admit: 2022-03-25 | Discharge: 2022-03-25 | Disposition: A | Discharge status: Home / Self Care | Payer: PPO | Source: Ambulatory Visit | Attending: Vascular Surgery | Admitting: Vascular Surgery

## 2022-03-25 ENCOUNTER — Ambulatory Visit (HOSPITAL_COMMUNITY)
Admission: RE | Admit: 2022-03-25 | Discharge: 2022-03-25 | Disposition: A | Discharge status: Home / Self Care | Payer: PPO | Source: Ambulatory Visit | Attending: Vascular Surgery | Admitting: Vascular Surgery

## 2022-03-25 ENCOUNTER — Other Ambulatory Visit: Payer: Self-pay | Admitting: Family Medicine

## 2022-03-25 VITALS — BP 140/88 | HR 101 | Temp 97.8°F | Resp 20 | Ht 71.0 in | Wt 172.0 lb

## 2022-03-25 DIAGNOSIS — R300 Dysuria: Secondary | ICD-10-CM

## 2022-03-25 DIAGNOSIS — I70235 Atherosclerosis of native arteries of right leg with ulceration of other part of foot: Secondary | ICD-10-CM

## 2022-03-25 DIAGNOSIS — I739 Peripheral vascular disease, unspecified: Secondary | ICD-10-CM | POA: Diagnosis not present

## 2022-03-25 DIAGNOSIS — Z9889 Other specified postprocedural states: Secondary | ICD-10-CM

## 2022-03-25 LAB — VAS US ABI WITH/WO TBI: Left ABI: 0.85

## 2022-03-25 NOTE — Progress Notes (Addendum)
Office Note     CC:  follow up Requesting Provider:  Shary Key, DO  HPI: [name] is a 89 y.o. (06/23/1933) male who presents for follow up of PAD. s/p right common femoral to plantar posterior tibial artery bypass in subcutaneous tunnel with 6 mm PTFE and right third toe amputation on 02/03/2022 by Dr. Stanford Breed.   He was doing well at his last follow up in December. His incisions were healing well. He did have dehiscence of his right 3rd toe amputation incision and there was an eschar present that w as removed. Healthy tissue appear underneath. He was given some wound care instructions and [place] RN orders were also modified. His non invasive studies suggested patency of his bypass graft and he had doppler signals in the right foot.  Today he presents with his daughter. He denies any pain in his right foot. He does still have some sutures present. He is otherwise continuing to care for his 3rd toe amputation site with the help of HH and his family. They are painting the toe with Betadine. HH is coming out 1 x/ week. There is dry scab present over 3rd toe amputation site. No other tissue loss. Denies any pain on ambulation or rest. Using a post op shoe and also rolling walker but is able to get around without walker.   He has a history of right superficial femoral to posterior tibial artery bypass with spliced greater saphenous vein by Dr. Nyoka Cowden in 2020, which allowed him to heal a wound on his first digit.  Eventually his bypass was found to be thrombosed in 2021 and he required right first and second toe amputations for nonhealing ulcerations  The pt is on a statin for cholesterol management.  The pt is on a daily aspirin.   Other AC:  Eliquis The pt is not on medication for hypertension.   The pt is diabetic.   Tobacco hx:  former  Past Medical History:  Diagnosis Date   ANEMIA, IRON DEF, NOS 01/15/2007   Qualifier: Diagnosis of  By: Jeannine Kitten MD, Matthew     Arthritis     Asthma    Cataract extraction status of left eye 2016   Cataract extraction status of right eye 2016   CHOLECYSTECTOMY, LAPAROSCOPIC, HX OF 04/12/2007   Qualifier: Diagnosis of  By: Netty Starring  MD, Lucianne Muss     COPD (chronic obstructive pulmonary disease) ([place])    Critical lower limb ischemia ([place]) 10/02/2018   Diabetes mellitus without complication ([place])    Gout 2007   Hypertension    Neuromuscular disorder ([place])    Neuropathy   Neuropathy    bilateral feet   Pneumonia    Toe ulcer ([place]) 09/20/2018    Past Surgical History:  Procedure Laterality Date   ABDOMINAL AORTOGRAM W/LOWER EXTREMITY N/A 10/03/2018   Procedure: ABDOMINAL AORTOGRAM W/LOWER EXTREMITY;  Surgeon: Marty Heck, MD;  Location: [facility];  Service: Cardiovascular;  Laterality: N/A;  Bilateral    ABDOMINAL AORTOGRAM W/LOWER EXTREMITY N/A 01/21/2022   Procedure: ABDOMINAL AORTOGRAM W/LOWER EXTREMITY;  Surgeon: Cherre Robins, MD;  Location: [facility];  Service: Cardiovascular;  Laterality: N/A;   AMPUTATION TOE Right 02/03/2022   Procedure: RIGHT THIRD TOE AMPUTATION;  Surgeon: Cherre Robins, MD;  Location: [facility];  Service: Vascular;  Laterality: Right;   CHOLECYSTECTOMY     FEMORAL-TIBIAL BYPASS GRAFT Right 02/03/2022   Procedure: RIGHT COMMON FEMORAL TO PEDAL ARTERY BYPASS WITH 6CM POLYTETRAFLUOROETHYLENE  GRAFT;  Surgeon: Cherre Robins, MD;  Location: [facility];  Service: Vascular;  Laterality: Right;   toe ampuation Right     Social History   Socioeconomic History   Marital status: Widowed    Spouse name: Not on file   Number of children: Not on file   Years of education: 10   Highest education level: 10th grade  Occupational History   Occupation: RetiredAnimal nutritionist Work  Tobacco Use   Smoking status: Former    Packs/day: 0.50    Years: 60.00    Total pack years: 30.00    Types: Cigars, Cigarettes    Start date: [birth year]/08/1951    Quit date: 06/08/2011    Years since quitting: 10.8     Passive exposure: Past   Smokeless tobacco: Never  Vaping Use   Vaping Use: Never used  Substance and Sexual Activity   Alcohol use: Not Currently    Comment: Hx of use   Drug use: No   Sexual activity: Not Currently  Other Topics Concern   Not on file  Social History Narrative   Patient lives with his daughter and her husband.   Patients wife died in 06-01-2018.   Health Care POA:    Emergency Contact: Daughter- Elberta Fortis [phone]   End of Life Plan:       Any pets: none   Diet: Patient has a varied diet and reports eating bacon and eggs every morning. Does not like most fruit. Likes flavored water, kool-aid.   No current exercise.     Seatbelts: Pt reports wearing seatbelt when in vehicle.   Hobbies: likes to watch [employer] and rest                Social Determinants of Health   Financial Resource Strain: Low Risk  (05/25/2021)   Overall Financial Resource Strain (CARDIA)    Difficulty of Paying Living Expenses: Not very hard  Food Insecurity: Food Insecurity Present (05/25/2021)   Hunger Vital Sign    Worried About Running Out of Food in the Last Year: Sometimes true    Ran Out of Food in the Last Year: Sometimes true  Transportation Needs: No Transportation Needs (02/08/2022)   PRAPARE - Hydrologist (Medical): No    Lack of Transportation (Non-Medical): No  Physical Activity: Inactive (05/25/2021)   Exercise Vital Sign    Days of Exercise per Week: 0 days    Minutes of Exercise per Session: 0 min  Stress: No Stress Concern Present (05/25/2021)   [place]    Feeling of Stress : Not at all  Social Connections: Socially Isolated (05/25/2021)   Social Connection and Isolation Panel [NHANES]    Frequency of Communication with Friends and Family: More than three times a week    Frequency of Social Gatherings with Friends and Family: More than three times a week    Attends Religious  Services: Never    Marine scientist or Organizations: No    Attends Archivist Meetings: Never    Marital Status: Widowed  Intimate Partner Violence: Not At Risk (05/25/2021)   Humiliation, Afraid, Rape, and Kick questionnaire    Fear of Current or Ex-Partner: No    Emotionally Abused: No    Physically Abused: No    Sexually Abused: No    Family History  Problem Relation Age of Onset   Asthma Mother    Arthritis Sister  Alcohol abuse Sister    Cirrhosis Sister    Asthma Sister    Heart disease Brother    Stroke Brother    Diabetes Brother    Cancer Brother        colon   Stroke Brother    Heart disease Brother    Arthritis Daughter    Asthma Daughter    Diabetes Son     Current Outpatient Medications  Medication Sig Dispense Refill   albuterol (PROVENTIL) (2.5 MG/3ML) 0.083% nebulizer solution Take 3 mLs (2.5 mg total) by nebulization every 6 (six) hours as needed for wheezing or shortness of breath. 150 mL 1   albuterol (VENTOLIN HFA) 108 (90 Base) MCG/ACT inhaler Inhale 2 puffs into the lungs every 6 (six) hours as needed for wheezing or shortness of breath.     allopurinol (ZYLOPRIM) 100 MG tablet Take 1 tablet (100 mg total) by mouth daily. (Patient taking differently: Take 150 mg by mouth daily.) 30 tablet 3   apixaban (ELIQUIS) 5 MG TABS tablet Take 1 tablet (5 mg total) by mouth 2 (two) times daily. 60 tablet 3   aspirin 81 MG EC tablet Take 81 mg by mouth daily.     atorvastatin (LIPITOR) 40 MG tablet Take 1 tablet (40 mg total) by mouth daily. 90 tablet 3   cycloSPORINE (RESTASIS) 0.05 % ophthalmic emulsion Place 1 drop into both eyes 2 (two) times daily. (Patient taking differently: Place 1 drop into both eyes 2 (two) times daily as needed (dry eyes).) 60 mL 3   diclofenac Sodium (VOLTAREN) 1 % GEL TAKE ON AFFECTED AREAS AS NEEDED FOR PAIN UP TO 4 TIMES A DAY 400 g 3   gabapentin (NEURONTIN) 300 MG capsule Take 1-2 capsules (300-600 mg total) by  mouth See admin instructions. 300 mg in the morning, 600 mg at bedtime 120 capsule 3   JARDIANCE 25 MG TABS tablet TAKE 1 TABLET (25 MG TOTAL) BY MOUTH DAILY. 90 tablet 3   levocetirizine (XYZAL) 5 MG tablet TAKE 1 TABLET BY MOUTH TWICE A DAY (Patient taking differently: Take 10 mg by mouth every morning.) 180 tablet 3   Multiple Vitamins-Minerals (MULTIVITAMIN WITH MINERALS) tablet Take 1 tablet by mouth daily.     oxyCODONE-acetaminophen (PERCOCET) 5-325 MG tablet Take 1 tablet by mouth every 8 (eight) hours as needed for severe pain. 15 tablet 0   tiotropium (SPIRIVA) 18 MCG inhalation capsule INHALE 1 CAPSULE VIA HANDIHALER ONCE DAILY AT THE SAME TIME EVERY DAY 30 capsule 2   triamcinolone cream (KENALOG) 0.1 % Apply topically as needed (Skin itching). 30 g 0   WIXELA INHUB 250-50 MCG/ACT AEPB INHALE 1 PUFF INTO THE LUNGS 2 (TWO) TIMES DAILY. IN THE MORNING AND AT BEDTIME. 60 each 2   No current facility-administered medications for this visit.    No Known Allergies   REVIEW OF SYSTEMS:   [X]  denotes positive finding, [ ]  denotes negative finding Cardiac  Comments:  Chest pain or chest pressure:    Shortness of breath upon exertion:    Short of breath when lying flat:    Irregular heart rhythm:        Vascular    Pain in calf, thigh, or hip brought on by ambulation:    Pain in feet at night that wakes you up from your sleep:     Blood clot in your veins:    Leg swelling:         Pulmonary    Oxygen at  home:    Productive cough:     Wheezing:         Neurologic    Sudden weakness in arms or legs:     Sudden numbness in arms or legs:     Sudden onset of difficulty speaking or slurred speech:    Temporary loss of vision in one eye:     Problems with dizziness:         Gastrointestinal    Blood in stool:     Vomited blood:         Genitourinary    Burning when urinating:     Blood in urine:        Psychiatric    Major depression:         Hematologic    Bleeding  problems:    Problems with blood clotting too easily:        Skin    Rashes or ulcers:        Constitutional    Fever or chills:      PHYSICAL EXAMINATION:  Vitals:   03/25/22 1346  BP: (!) 140/88  Pulse: (!) 101  Resp: 20  Temp: 97.8 F (36.6 C)  TempSrc: Temporal  SpO2: 95%  Weight: 172 lb (78 kg)  Height: 5\' 11"  (1.803 m)    General:  WDWN in NAD; vital signs documented above Gait: Normal. Using rolling walker HENT: WNL, normocephalic Pulmonary: normal non-labored breathing , without wheezing Cardiac: regular HR Vascular Exam/Pulses: Palpable pulse in distal bypass graft. No palpable pulses in foot. Foot warm and well perfused Extremities: without ischemic changes, without Gangrene , without cellulitis; without open wounds; dry eschar overlying 3rd toe amputation site. Right PT distal ankle incision healing well. Sutures removed. Nylon sutures removed form 3rd toe amputation site as well. Painted with betadine and large band aid applied.  Musculoskeletal: no muscle wasting or atrophy  Neurologic: A&O X 3;  No focal weakness or paresthesias are detected Psychiatric:  The pt has Normal affect.   Non-Invasive Vascular Imaging:   +-------+-----------+-----------+------------+------------+  ABI/TBIToday's ABIToday's TBIPrevious ABIPrevious TBI  +-------+-----------+-----------+------------+------------+  Right [place]         amputation [place]          amputation    +-------+-----------+-----------+------------+------------+  Left  0.85       0.31       0.82        0.46          +-------+-----------+-----------+------------+------------+   VAS [place] Lower Extremity bypass graft duplex: Summary: Right: Widely patent bypass graft without evidence of stenosis; however, distal graft velocities are <40 cm/s and may indicate threatened graft.   ASSESSMENT/PLAN:: 89 y.o. male here for follow up for PAD.He is s/p right common femoral to plantar posterior tibial artery  bypass in subcutaneous tunnel with 6 mm PTFE and right third toe amputation on 02/03/2022 by Dr. Stanford Breed. Incisions are all healing well. Has eschar overlying 3rd toe amputation site. No signs of infection. Palpable pulse in RLE bypass graft. Duplex shows patent bypass graft. There are low velocities throughout the graft but waveforms appear multiphasic. RLE ABIs non compressible. Left Stable ABI.  I will review the bypass findings with Dr. Stanford Breed, however at this time will just plan for close interval follow up of the bypass. - Recommend washing incisions with mild soap and water, pat dry. Pain 3rd toe site with betadine - Keep feet protected - continue Aspirin, statin, Eliquis - Will follow up in 3  months  with ABI and RLE bypass graft duplex - He knows to call if any concerns about wound healing or any new or concerning symptoms   Karoline Caldwell, PA-C [facility] [phone]  Clinic MD:   Virl Cagey

## 2022-03-30 ENCOUNTER — Other Ambulatory Visit: Payer: Self-pay

## 2022-03-30 DIAGNOSIS — I739 Peripheral vascular disease, unspecified: Secondary | ICD-10-CM

## 2022-03-30 DIAGNOSIS — I7025 Atherosclerosis of native arteries of other extremities with ulceration: Secondary | ICD-10-CM

## 2022-03-30 DIAGNOSIS — I70235 Atherosclerosis of native arteries of right leg with ulceration of other part of foot: Secondary | ICD-10-CM

## 2022-03-30 DIAGNOSIS — Z9889 Other specified postprocedural states: Secondary | ICD-10-CM

## 2022-04-04 ENCOUNTER — Other Ambulatory Visit: Payer: Self-pay | Admitting: Family Medicine

## 2022-04-04 DIAGNOSIS — J302 Other seasonal allergic rhinitis: Secondary | ICD-10-CM

## 2022-04-12 ENCOUNTER — Other Ambulatory Visit: Payer: Self-pay | Admitting: Family Medicine

## 2022-04-12 DIAGNOSIS — J449 Chronic obstructive pulmonary disease, unspecified: Secondary | ICD-10-CM

## 2022-04-13 DIAGNOSIS — I70299 Other atherosclerosis of native arteries of extremities, unspecified extremity: Secondary | ICD-10-CM | POA: Diagnosis not present

## 2022-04-13 DIAGNOSIS — I739 Peripheral vascular disease, unspecified: Secondary | ICD-10-CM | POA: Diagnosis not present

## 2022-04-13 DIAGNOSIS — L97909 Non-pressure chronic ulcer of unspecified part of unspecified lower leg with unspecified severity: Secondary | ICD-10-CM | POA: Diagnosis not present

## 2022-04-18 ENCOUNTER — Telehealth: Payer: Self-pay

## 2022-04-18 NOTE — Telephone Encounter (Signed)
Patient calls nurse line checking the status of a "form."   He reports he received a letter from his insurance company, [employer], stating he would be dis-enrolled as of 2/29 if diabetic diagnose was not confirmed by PCP.    He reports they faxed the form over to our office for PCP to fill out, however they has not received anything back.   Will forward to PCP to see if she has received form. If not, please let me know and I will call [employer].   [phone]

## 2022-04-18 NOTE — Telephone Encounter (Signed)
I called [employer] and spoke with Caryl Pina.   Verbal confirmation given to Caryl Pina in regards to patients Type 2 DM diagnose.

## 2022-05-12 ENCOUNTER — Telehealth: Payer: Self-pay

## 2022-05-12 NOTE — Telephone Encounter (Signed)
Received phone call from [facility], NP with [facility] regarding patient. She reports that at home visit today, patient had elevated CBG of 391. Also reports polyuria and increased thirst.   Requesting that patient be seen today or tomorrow. NP reports that she did not feel that patient was critical and needed to be seen in the hospital. ED precautions were given to patient and daughter. Attempted to call patient to schedule, he did not answer. Lvm asking patient to return call.   He returned call and spoke with scheduling. Patient scheduled for tomorrow with PCP.   Talbot Grumbling, RN

## 2022-05-13 ENCOUNTER — Ambulatory Visit (INDEPENDENT_AMBULATORY_CARE_PROVIDER_SITE_OTHER): Payer: PPO | Admitting: Family Medicine

## 2022-05-13 ENCOUNTER — Other Ambulatory Visit: Payer: Self-pay

## 2022-05-13 ENCOUNTER — Encounter: Payer: Self-pay | Admitting: Family Medicine

## 2022-05-13 VITALS — BP 110/76 | HR 114 | Ht 71.0 in | Wt 168.4 lb

## 2022-05-13 DIAGNOSIS — E1165 Type 2 diabetes mellitus with hyperglycemia: Secondary | ICD-10-CM

## 2022-05-13 DIAGNOSIS — R5383 Other fatigue: Secondary | ICD-10-CM

## 2022-05-13 DIAGNOSIS — E1142 Type 2 diabetes mellitus with diabetic polyneuropathy: Secondary | ICD-10-CM

## 2022-05-13 LAB — POCT GLYCOSYLATED HEMOGLOBIN (HGB A1C): HbA1c, POC (controlled diabetic range): 9.1 % — AB (ref 0.0–7.0)

## 2022-05-13 LAB — GLUCOSE, POCT (MANUAL RESULT ENTRY): POC Glucose: 290 mg/dl — AB (ref 70–99)

## 2022-05-13 MED ORDER — BLOOD GLUCOSE MONITOR KIT: PACK | 0 refills | Status: AC

## 2022-05-13 MED ORDER — INSULIN GLARGINE 100 UNIT/ML SOLOSTAR PEN: 5.0000 [IU] | PEN_INJECTOR | Freq: Every day | SUBCUTANEOUS | 11 refills | Status: DC

## 2022-05-13 NOTE — Patient Instructions (Addendum)
It was great seeing you today!  Im sorry you havent been feeling well.   I would like you to start checking your sugars and record those every morning. Start Lantus 5 units daily in the morning after you check your sugars. If blood sugars are below 100 in the mornings do not give insulin.   We are checking your blood work and I will call you with those results   Go to the ED if you are feeling worse or with high blood sugars  Please check-out at the front desk before leaving the clinic. I'd like to see you back next week, but if you need to be seen earlier than that for any new issues we're happy to fit you in, just give [place] a call!  Feel free to call with any questions or concerns at any time, at [phone].   Take care,  Dr. Shary Key [facility]  Diabetes Mellitus and Nutrition, Adult When you have diabetes, or diabetes mellitus, it is very important to have healthy eating habits because your blood sugar (glucose) levels are greatly affected by what you eat and drink. Eating healthy foods in the right amounts, at about the same times every day, can help you: Manage your blood glucose. Lower your risk of heart disease. Improve your blood pressure. Reach or maintain a healthy weight. What can affect my meal plan? Every person with diabetes is different, and each person has different needs for a meal plan. Your health care provider may recommend that you work with a dietitian to make a meal plan that is best for you. Your meal plan may vary depending on factors such as: The calories you need. The medicines you take. Your weight. Your blood glucose, blood pressure, and cholesterol levels. Your activity level. Other health conditions you have, such as heart or kidney disease. How do carbohydrates affect me? Carbohydrates, also called carbs, affect your blood glucose level more than any other type of food. Eating carbs raises the amount of glucose in your  blood. It is important to know how many carbs you can safely have in each meal. This is different for every person. Your dietitian can help you calculate how many carbs you should have at each meal and for each snack. How does alcohol affect me? Alcohol can cause a decrease in blood glucose (hypoglycemia), especially if you use insulin or take certain diabetes medicines by mouth. Hypoglycemia can be a life-threatening condition. Symptoms of hypoglycemia, such as sleepiness, dizziness, and confusion, are similar to symptoms of having too much alcohol. Do not drink alcohol if: Your health care provider tells you not to drink. You are pregnant, may be pregnant, or are planning to become pregnant. If you drink alcohol: Limit how much you have to: 0-1 drink a day for women. 0-2 drinks a day for men. Know how much alcohol is in your drink. In the [place] one drink equals one 12 oz bottle of beer (355 mL), one 5 oz glass of wine (148 mL), or one 1 oz glass of hard liquor (44 mL). Keep yourself hydrated with water, diet soda, or unsweetened iced tea. Keep in mind that regular soda, juice, and other mixers may contain a lot of sugar and must be counted as carbs. What are tips for following this plan?  Reading food labels Start by checking the serving size on the Nutrition Facts label of packaged foods and drinks. The number of calories and the amount of carbs,  fats, and other nutrients listed on the label are based on one serving of the item. Many items contain more than one serving per package. Check the total grams (g) of carbs in one serving. Check the number of grams of saturated fats and trans fats in one serving. Choose foods that have a low amount or none of these fats. Check the number of milligrams (mg) of salt (sodium) in one serving. Most people should limit total sodium intake to less than 2,300 mg per day. Always check the nutrition information of foods labeled as "low-fat" or "nonfat." These  foods may be higher in added sugar or refined carbs and should be avoided. Talk to your dietitian to identify your daily goals for nutrients listed on the label. Shopping Avoid buying canned, pre-made, or processed foods. These foods tend to be high in fat, sodium, and added sugar. Shop around the outside edge of the grocery store. This is where you will most often find fresh fruits and vegetables, bulk grains, fresh meats, and fresh dairy products. Cooking Use low-heat cooking methods, such as baking, instead of high-heat cooking methods, such as deep frying. Cook using healthy oils, such as olive, canola, or sunflower oil. Avoid cooking with butter, cream, or high-fat meats. Meal planning Eat meals and snacks regularly, preferably at the same times every day. Avoid going long periods of time without eating. Eat foods that are high in fiber, such as fresh fruits, vegetables, beans, and whole grains. Eat 4-6 oz (112-168 g) of lean protein each day, such as lean meat, chicken, fish, eggs, or tofu. One ounce (oz) (28 g) of lean protein is equal to: 1 oz (28 g) of meat, chicken, or fish. 1 egg.  cup (62 g) of tofu. Eat some foods each day that contain healthy fats, such as avocado, nuts, seeds, and fish. What foods should I eat? Fruits Berries. Apples. Oranges. Peaches. Apricots. Plums. Grapes. Mangoes. Papayas. Pomegranates. Kiwi. Cherries. Vegetables Leafy greens, including lettuce, spinach, kale, chard, collard greens, mustard greens, and cabbage. Beets. Cauliflower. Broccoli. Carrots. Green beans. Tomatoes. Peppers. Onions. Cucumbers. Brussels sprouts. Grains Whole grains, such as whole-wheat or whole-grain bread, crackers, tortillas, cereal, and pasta. Unsweetened oatmeal. Quinoa. Brown or wild rice. Meats and other proteins Seafood. Poultry without skin. Lean cuts of poultry and beef. Tofu. Nuts. Seeds. Dairy Low-fat or fat-free dairy products such as milk, yogurt, and cheese. The  items listed above may not be a complete list of foods and beverages you can eat and drink. Contact a dietitian for more information. What foods should I avoid? Fruits Fruits canned with syrup. Vegetables Canned vegetables. Frozen vegetables with butter or cream sauce. Grains Refined white flour and flour products such as bread, pasta, snack foods, and cereals. Avoid all processed foods. Meats and other proteins Fatty cuts of meat. Poultry with skin. Breaded or fried meats. Processed meat. Avoid saturated fats. Dairy Full-fat yogurt, cheese, or milk. Beverages Sweetened drinks, such as soda or iced tea. The items listed above may not be a complete list of foods and beverages you should avoid. Contact a dietitian for more information. Questions to ask a health care provider Do I need to meet with a certified diabetes care and education specialist? Do I need to meet with a dietitian? What number can I call if I have questions? When are the best times to check my blood glucose? Where to find more information: American Diabetes Association: diabetes.org Academy of Nutrition and Dietetics: eatright.[employer] of Diabetes and  Digestive and Kidney Diseases: [employer].is Association of Diabetes Care & Education Specialists: diabeteseducator.org Summary It is important to have healthy eating habits because your blood sugar (glucose) levels are greatly affected by what you eat and drink. It is important to use alcohol carefully. A healthy meal plan will help you manage your blood glucose and lower your risk of heart disease. Your health care provider may recommend that you work with a dietitian to make a meal plan that is best for you. This information is not intended to replace advice given to you by your health care provider. Make sure you discuss any questions you have with your health care provider. Document Revised: 09/25/2019 Document Reviewed: 09/25/2019 Elsevier Patient  Education  [place].

## 2022-05-13 NOTE — Progress Notes (Unsigned)
    SUBJECTIVE:   CHIEF COMPLAINT / HPI:   Patient presents with his daughter. States he has felt bad for the past week- has been feeling tired, fatigue, and experiencing Increased thirst, urination and decreased appetite. Denies abdominal pain. Currently taking Jardiance 25mg . Had a visit by a home health nurse who checked his CBG which was 391. They advised he come to the hospital but patient preferred to have this PCP appointment. Denies fever, recent respiratory infection. He does endourse coughing up more phlegm for the past 2-3 days.    PERTINENT  PMH / PSH: Reviewed   OBJECTIVE:   BP 110/76   Pulse (!) 114   Ht 5\' 11"  (1.803 m)   Wt 168 lb 6.4 oz (76.4 kg)   SpO2 96%   BMI 23.49 kg/m    General: alert, appearing fatigued though NAD HEENT: MMM. EOMI. Normal conjunctiva  CV: Tachycardic, no murmur Resp: CTAB normal WOB. Speaking in full sentences  GI: soft, non distended, non tender Derm: warm, dry. No edema   ASSESSMENT/PLAN:   Type 2 diabetes mellitus with hyperglycemia ([place]) Patient presents with 1 week of polyuria, polydipsia, fatigue. Had a home health visit and CBG was 391 so he was advised to come to the hospital but opted for PCP visit.  On exam tachycardic to 114 and still elevated on repeat check, suspect due to dehydration with his elevated sugars. Normal mental status. Unclear as to trigger of his recently elevated blood sugars. He has been taking Jardiance 25mg . CBG today 290, A1c 9.1, Precepted with Dr. Gwendlyn Deutscher. Discussed with patient in detail my recommendations of being evaluated in the ED in case he is in DKA. Patient declined and risks discussed with him and his daughter. Started on Lantus 5 units today though he was apprehensive to start insulin. Had assistance with insulin teaching by pharmacy team. Ordered glucometer kit so he can start checking his sugars.  Will check CMP, CBC, TSH, Vit B12. Discussed very strict return precautions if he was to worsen. Advised  him to schedule follow up visit in the clinic next week.    [name]

## 2022-05-15 DIAGNOSIS — E1165 Type 2 diabetes mellitus with hyperglycemia: Secondary | ICD-10-CM | POA: Insufficient documentation

## 2022-05-15 LAB — CBC
Hematocrit: 46 % (ref 37.5–51.0)
Hemoglobin: 15.1 g/dL (ref 13.0–17.7)
MCH: 24.7 pg — ABNORMAL LOW (ref 26.6–33.0)
MCHC: 32.8 g/dL (ref 31.5–35.7)
MCV: 75 fL — ABNORMAL LOW (ref 79–97)
Platelets: 266 10*3/uL (ref 150–450)
RBC: 6.12 x10E6/uL — ABNORMAL HIGH (ref 4.14–5.80)
RDW: 15.9 % — ABNORMAL HIGH (ref 11.6–15.4)
WBC: 17.1 10*3/uL — ABNORMAL HIGH (ref 3.4–10.8)

## 2022-05-15 LAB — COMPREHENSIVE METABOLIC PANEL
ALT: 30 IU/L (ref 0–44)
AST: 38 IU/L (ref 0–40)
Albumin/Globulin Ratio: 1.2 (ref 1.2–2.2)
Albumin: 4 g/dL (ref 3.7–4.7)
Alkaline Phosphatase: 131 IU/L — ABNORMAL HIGH (ref 44–121)
BUN/Creatinine Ratio: 19 (ref 10–24)
BUN: 43 mg/dL — ABNORMAL HIGH (ref 8–27)
Bilirubin Total: 0.6 mg/dL (ref 0.0–1.2)
CO2: 17 mmol/L — ABNORMAL LOW (ref 20–29)
Calcium: 9.3 mg/dL (ref 8.6–10.2)
Chloride: 98 mmol/L (ref 96–106)
Creatinine, Ser: 2.23 mg/dL — ABNORMAL HIGH (ref 0.76–1.27)
Globulin, Total: 3.3 g/dL (ref 1.5–4.5)
Glucose: 262 mg/dL — ABNORMAL HIGH (ref 70–99)
Potassium: 5 mmol/L (ref 3.5–5.2)
Sodium: 135 mmol/L (ref 134–144)
Total Protein: 7.3 g/dL (ref 6.0–8.5)
eGFR: 28 mL/min/{1.73_m2} — ABNORMAL LOW (ref >59)

## 2022-05-15 LAB — VITAMIN B12: Vitamin B-12: 764 pg/mL (ref 232–1245)

## 2022-05-15 LAB — TSH: TSH: 2.54 u[IU]/mL (ref 0.450–4.500)

## 2022-05-15 NOTE — Assessment & Plan Note (Signed)
Patient presents with 1 week of polyuria, polydipsia, fatigue. Had a home health visit and CBG was 391 so he was advised to come to the hospital but opted for PCP visit.  On exam tachycardic to 114 and still elevated on repeat check, suspect due to dehydration with his elevated sugars. Normal mental status. Unclear as to trigger of his recently elevated blood sugars. He has been taking Jardiance '25mg'$ . CBG today 290, A1c 9.1, Precepted with Dr. Gwendlyn Deutscher. Discussed with patient in detail my recommendations of being evaluated in the ED in case he is in DKA. Patient declined and risks discussed with him and his daughter. Started on Lantus 5 units today though he was apprehensive to start insulin. Had assistance with insulin teaching by pharmacy team. Ordered glucometer kit so he can start checking his sugars.  Will check CMP, CBC, TSH, Vit B12. Discussed very strict return precautions if he was to worsen. Advised him to schedule follow up visit in the clinic next week.

## 2022-05-16 ENCOUNTER — Ambulatory Visit (INDEPENDENT_AMBULATORY_CARE_PROVIDER_SITE_OTHER): Payer: PPO | Admitting: Family Medicine

## 2022-05-16 ENCOUNTER — Telehealth: Payer: Self-pay

## 2022-05-16 VITALS — BP 125/73 | HR 96 | Ht 71.0 in | Wt 169.4 lb

## 2022-05-16 DIAGNOSIS — E1165 Type 2 diabetes mellitus with hyperglycemia: Secondary | ICD-10-CM | POA: Diagnosis not present

## 2022-05-16 DIAGNOSIS — Z794 Long term (current) use of insulin: Secondary | ICD-10-CM | POA: Diagnosis not present

## 2022-05-16 MED ORDER — TIOTROPIUM BROMIDE MONOHYDRATE 18 MCG IN CAPS: ORAL_CAPSULE | RESPIRATORY_TRACT | 2 refills | Status: DC

## 2022-05-16 MED ORDER — GABAPENTIN 300 MG PO CAPS: 300.0000 mg | ORAL_CAPSULE | Freq: Every day | ORAL | 1 refills | Status: DC | PRN

## 2022-05-16 NOTE — Progress Notes (Signed)
    SUBJECTIVE:   CHIEF COMPLAINT / HPI:   Cyris Maalouf is a 89 y.o. male who presents to the [facility] clinic today to discuss the following concerns:   Patient was last seen by his PCP on 3/8 for follow-up on his type 2 diabetes. His A1c returned at 9.1, he was recommended for potential hospital evaluation for DKA given his symptoms of polyuria, polydipsia, fatigue, tachycardia and potential dehydration.  At last visit he was started on Lantus 5 units. Glucometer supplies were sent at that visit.  Patient's daughter called the nurse line today as they were on sure how to use the supplies and have been receiving error messages on the glucometer.  PERTINENT  PMH / PSH: Hypertension, PAD, COPD, CKD stage III  OBJECTIVE:   BP 125/73   Pulse (!) 109   Ht 5\' 11"  (1.803 m)   Wt 169 lb 6.4 oz (76.8 kg)   SpO2 95%   BMI 23.63 kg/m   Vitals:   05/16/22 1345 05/16/22 1404  BP: 125/73   Pulse: (!) 109 96  SpO2: 95%    General: Elderly, NAD, pleasant, able to participate in exam Respiratory: normal effort Psych: Normal affect and mood  ASSESSMENT/PLAN:   1. Type 2 diabetes mellitus with hyperglycemia, with long-term current use of insulin (HCC) Instructed on how to use glucometer.  Demonstrated and patient was able to do it on his own. Readings were 227 and 209. Reviewed recent lab work on 2 3/8 which showed elevated creatinine at 2.23, marked bump from 02/2022 which was 1.49. BL appears to be 1.7-2. Discussed reduction in Gabapentin given renal function.  - Basic Metabolic Panel - gabapentin (NEURONTIN) 300 MG capsule; Take 1 capsule (300 mg total) by mouth daily as needed. 300 mg in the morning, 600 mg at bedtime  Dispense: 90 capsule; Refill: 1 - Scheduled with Dr. Valentina Lucks 3/19 for insulin titration  - Discussed holding Jardiance for sick days  - Next A1c due in June    Sharion Settler, [place]

## 2022-05-16 NOTE — Telephone Encounter (Signed)
Daughter calls nurse line requesting an apt for glucometer teaching.   She reports they were given the prescription on Friday, however reports the pharmacy did not show them how to use the DM supplies.   She reports they were able to get the meter to work one time yesterday and cbg was ~303. She reports they have been trying to use today, however have received an "error" message x4.   Unsure if sugar is high and why "error" message is being received. She denies any current symptoms of hyperglycemia.   Patient scheduled for this afternoon to high CBG checked.

## 2022-05-16 NOTE — Patient Instructions (Addendum)
It was wonderful to see you today.  Please bring ALL of your medications with you to every visit.   Today we talked about:  We discussed how to check your sugar.  Continue on the 5U of insulin until you see Dr. Valentina Lucks next week. We discussed the importance of holding your Jardiance if you are feeling ill.  Return for abdominal pain, nausea, vomiting, unable to keep any fluids down, concerns of low sugar (shaking, dizziness, confusion).   We are rechecking kidney function today. I have sent in a prescription refill for gabapentin, however it is at a reduced dose of 300 mg daily as needed. This is the safest maximum dose for your kidney function.  Thank you for coming to your visit as scheduled. We have had a large "no-show" problem lately, and this significantly limits our ability to see and care for patients. As a friendly reminder- if you cannot make your appointment please call to cancel. We do have a no show policy for those who do not cancel within 24 hours. Our policy is that if you miss or fail to cancel an appointment within 24 hours, 3 times in a 57-monthperiod, you may be dismissed from our clinic.   Thank you for choosing [facility]   Please call [phone] any questions about today's appointment.  Please be sure to schedule follow up at the front  desk before you leave today.   ASharion Settler DO PGY-3 Family Medicine

## 2022-05-17 ENCOUNTER — Telehealth: Payer: Self-pay | Admitting: Family Medicine

## 2022-05-17 ENCOUNTER — Other Ambulatory Visit: Payer: Self-pay | Admitting: Family Medicine

## 2022-05-17 DIAGNOSIS — N179 Acute kidney failure, unspecified: Secondary | ICD-10-CM

## 2022-05-17 LAB — BASIC METABOLIC PANEL
BUN/Creatinine Ratio: 22 (ref 10–24)
BUN: 49 mg/dL — ABNORMAL HIGH (ref 8–27)
CO2: 19 mmol/L — ABNORMAL LOW (ref 20–29)
Calcium: 9.3 mg/dL (ref 8.6–10.2)
Chloride: 99 mmol/L (ref 96–106)
Creatinine, Ser: 2.25 mg/dL — ABNORMAL HIGH (ref 0.76–1.27)
Glucose: 194 mg/dL — ABNORMAL HIGH (ref 70–99)
Potassium: 5 mmol/L (ref 3.5–5.2)
Sodium: 136 mmol/L (ref 134–144)
eGFR: 28 mL/min/{1.73_m2} — ABNORMAL LOW (ref >59)

## 2022-05-17 MED ORDER — SODIUM BICARBONATE 650 MG PO TABS: 650.0000 mg | ORAL_TABLET | Freq: Two times a day (BID) | ORAL | 1 refills | Status: AC

## 2022-05-17 NOTE — Telephone Encounter (Signed)
Called patient to check on him and discuss recent blood work from visit on 3/11. Kidney function worsening. Would benefit from nephrology appointment if agreeable. Will try calling again.

## 2022-05-19 ENCOUNTER — Telehealth: Payer: Self-pay

## 2022-05-19 NOTE — Telephone Encounter (Signed)
Called and left message for patient informing him of upcoming appointment.  05/25/2022 Renal Ultrasound [facility] 0930 w/ arrival '@0915'$   Patient is to arrive with a full bladder.  Drink 32 oz of water prior to exam.  Do not void prior to exam.  .Ozella Almond, CMA

## 2022-05-24 ENCOUNTER — Telehealth: Payer: Self-pay

## 2022-05-24 ENCOUNTER — Ambulatory Visit (INDEPENDENT_AMBULATORY_CARE_PROVIDER_SITE_OTHER): Payer: PPO | Admitting: Student

## 2022-05-24 ENCOUNTER — Encounter: Payer: Self-pay | Admitting: Pharmacist

## 2022-05-24 ENCOUNTER — Ambulatory Visit (INDEPENDENT_AMBULATORY_CARE_PROVIDER_SITE_OTHER): Payer: PPO | Admitting: Pharmacist

## 2022-05-24 VITALS — BP 126/81 | HR 86 | Wt 165.4 lb

## 2022-05-24 DIAGNOSIS — I739 Peripheral vascular disease, unspecified: Secondary | ICD-10-CM

## 2022-05-24 DIAGNOSIS — L989 Disorder of the skin and subcutaneous tissue, unspecified: Secondary | ICD-10-CM | POA: Diagnosis not present

## 2022-05-24 DIAGNOSIS — E1142 Type 2 diabetes mellitus with diabetic polyneuropathy: Secondary | ICD-10-CM

## 2022-05-24 MED ORDER — APIXABAN 5 MG PO TABS: 5.0000 mg | ORAL_TABLET | Freq: Two times a day (BID) | ORAL | 0 refills | Status: DC

## 2022-05-24 MED ORDER — MUPIROCIN 2 % EX OINT: 1.0000 | TOPICAL_OINTMENT | Freq: Two times a day (BID) | CUTANEOUS | 0 refills | Status: AC

## 2022-05-24 NOTE — Assessment & Plan Note (Signed)
Diabetes recently diagnosed in December 2023, currently uncontrolled with most recent A1c of 9.1% (05/2022). Patient is able to verbalize appropriate hypoglycemia management plan. Patient reports being adherent to medications. Control is suboptimal due to not regularly checking blood sugars at home. - Increased Lantus (insulin glargine) from 5 units to 6 units once daily in the morning - Continued Jardiance (empagliflozin) 25 mg once daily - Counseled patient to continue checking blood sugars at home and to keep a log that he can bring in to his next appointment -Patient educated on purpose, proper use, and potential adverse effects  -Extensively discussed pathophysiology of diabetes, recommended lifestyle interventions, dietary effects on blood sugar control.  -Counseled on s/sx of and management of hypoglycemia.

## 2022-05-24 NOTE — Patient Instructions (Addendum)
It was great to see you! Thank you for allowing me to participate in your care!   Our plans for today:  -I am prescribing mupirocin to place on skin twice a day for 5 days-Dr. Arby Barrette will follow-up with you and see how the skin lesions are doing -We are getting some labs today for evaluation -I am refilling your eliquis-please let us know if you have issues refilling thos  Take care and seek immediate care sooner if you develop any concerns.  Gerrit Heck, MD

## 2022-05-24 NOTE — Progress Notes (Signed)
S:     Chief Complaint  Patient presents with   Medication Management    Diabetes   89 y.o. male who presents for diabetes evaluation, education, and management.  PMH is significant for PAD, diabetes, CKD Stage III, COPD.  Patient was referred and last seen by Primary Care Provider, Dr. Nita Sells, on 05/16/22.   At last visit, A1c was 9.1% and patient and daughter had questions on how to correctly use his glucometer.   Today, patient arrives, with daughter, in good spirits and presents without any assistance.Today, patient is concerned about a scaly rash on his back that he thinks might be shingles.  Deferred to [place].   Patient reports Diabetes was diagnosed in December of 2023.   Current diabetes medications include: Lantus (insulin glargine) 5 units daily, Jardiance (empagliflozin) 25 mg daily Current hypertension medications include: none Current hyperlipidemia medications include: atorvastatin 40 mg daily  Patient reports adherence to taking all medications as prescribed.   Insurance coverage: [employer]  Patient denies hypoglycemic events. Reports he feels like he is making progress towards feeling better.   Reported home fasting blood sugars: in 130s to 160s, one recent reading of 202.  Patient reports nocturia (nighttime urination). Reports waking up 3-4 times every night.  Patient reports neuropathy (nerve pain). Feet are burning and numb in the mornings.  Patient denies visual changes.  Patient reported dietary habits: eats eggs and bacon for breakfast; admits to not eating enough vegetables; typically drinks water- states he has cut soda out of his diet.   O:   Review of Systems  All other systems reviewed and are negative.   Physical Exam Constitutional:      Appearance: Normal appearance.  Pulmonary:     Effort: Pulmonary effort is normal.  Skin:    Comments: Has scaly rash on lower back  Neurological:     Mental Status: He is  alert.  Psychiatric:        Mood and Affect: Mood normal.        Behavior: Behavior normal.        Thought Content: Thought content normal.    Lab Results  Component Value Date   HGBA1C 9.1 (A) 05/13/2022   Vitals:   05/24/22 1410  BP: 126/81  Pulse: 86  SpO2: 100%    Lipid Panel     Component Value Date/Time   CHOL 95 02/04/2022 0228   CHOL 126 01/08/2021 1105   TRIG 120 02/04/2022 0228   HDL 23 (L) 02/04/2022 0228   HDL 34 (L) 01/08/2021 1105   CHOLHDL 4.1 02/04/2022 0228   VLDL 24 02/04/2022 0228   LDLCALC 48 02/04/2022 0228   LDLCALC 59 01/08/2021 1105   LDLDIRECT 65 07/09/2013 0922    Clinical Atherosclerotic Cardiovascular Disease (ASCVD): PAD  A/P: Diabetes recently diagnosed in December 2023, currently uncontrolled with most recent A1c of 9.1% (05/2022). Patient is able to verbalize appropriate hypoglycemia management plan. Patient reports being adherent to medications. Control is suboptimal due to not regularly checking blood sugars at home. - Increased Lantus (insulin glargine) from 5 units to 6 units once daily in the morning - Continued Jardiance (empagliflozin) 25 mg once daily - Counseled patient to continue checking blood sugars at home and to keep a log that he can bring in to his next appointment -Patient educated on purpose, proper use, and potential adverse effects  -Extensively discussed pathophysiology of diabetes, recommended lifestyle interventions, dietary effects on blood sugar control.  -Counseled  on s/sx of and management of hypoglycemia.   History of Gout Patient on allopurinol at home, denies having any recent gout flares. Today, he expressed concern that his dose had been reduced.  - Educated patient on renal dose adjustments for allopurinol - Counseled patient to continue taking allopurinol 100 mg daily  Lesions/wounds on back - deferred to acute care work-in physician for the day, Dr. Jinny Sanders.   History of Atrial Fibrillation -  currently taking Apixaban 5mg  BID Problem list /treatment plan to be addressed at next PCP visit with Dr. Arby Barrette    Written patient instructions provided. Patient verbalized understanding of treatment plan.  Total time in face to face counseling 30 minutes.    Follow-up:  PCP clinic visit on 05/27/22 Patient seen with Louanne Belton PharmD PGY-1 Pharmacy Resident and Estelle June, PharmD Candidate.

## 2022-05-24 NOTE — Telephone Encounter (Signed)
Pt's daughter, Elmo Putt, called stating that the pt's PCP refilled his Eliquis, but she wanted to make sure that he is supposed to still be taking it.  Reviewed pt's chart, returned call for clarification, two identifiers used. Informed her that the chart showed that the pt had 3 remaining refills at his last OV in 03/2022. She stated that the pt was told that he didn't. Instructed pt to continue taking until his next OV in 06/2022. She stated that the pt is [facility] and was possibly confused on his medications. She questioned it again since he hadn't been taking it. Asked her when was his last dose and she stated it had been since January. Asked if pt was having any symptoms and she stated that he mentioned his foot hurting. She stated that he c/o foot pain when ambulating and has to elevate it to get relief. Instructed her to monitor for symptoms and call the office for an earlier appt if any worsen. Restarting Eliquis. Sending msg to provider for clarification. Confirmed understanding.

## 2022-05-24 NOTE — Assessment & Plan Note (Signed)
Multiple ulcers on back. Seem to be healing.  Ddx: -drug eruption/SJS-no new meds, no mucosal involvement -spider bite-no known bite -stopped eliquis for a month-unusual place for clots but possible -necrolytic migratory erythema-unlikely given rarity of glucagonoma-but does have diabetes and skin condition that has been migration + itchy+ weight loss (175 in December now 165) -?blood dyscrasia-check labs -?zinc deficiency -infectious less likely as no systemic symptoms or pain/erythema on exam -pressure sores-patient is pretty mobile-stands and able to lean forward/pull up shirt without assistance  Patient declines labs today. Will place future orders for labs. Would like to prevent superimposed infection-will treat with mupirocin BID for 5 days. Whatever this is seems to be calming down and resolving. -F/u Friday with Dr. Arby Barrette -Wound care referral -Mupirocin BID for 5 days -consider biopsy if not improving -Future lab orders placed-HIV, RPR, CMP, CBC with Diff, blood smear, glucagon

## 2022-05-24 NOTE — Progress Notes (Signed)
    SUBJECTIVE:   CHIEF COMPLAINT / HPI: Ulcers  Ulcerated rash started last week on his back. Started on his mid back and spread over Was very itchy, not painful Was draining fluid initially but is now dried Has not had any fevers or vomiting.  Started with one spot and now has three. Unsure what cream he put on it but that had helped  Was on antibiotic from urologist for UTI, 1-2 weeks ago--this started way after the antibiotic ended Has been out of Eliquis for a month - states he has been unable to get a refill - supposed to be on it for PAD and after toe amputation/bypass-last time he took this was in January (at that time vascular surgery note recommended continuing this) No bug bite hx   PERTINENT  PMH / PSH: femoral bypass 2023  OBJECTIVE:   BP: 126/81  Pulse: 86  SpO2: 100%   General: NAD, awake, alert, responsive to questions Head: Normocephalic atraumatic Skin: Three ulcers on back, no drainage or tenderness, does have mottling of skin on back; no lesions on hand or oral mucosa   ASSESSMENT/PLAN:   Skin lesion Multiple ulcers on back. Seem to be healing.  Ddx: -drug eruption/SJS-no new meds, no mucosal involvement -spider bite-no known bite -stopped eliquis for a month-unusual place for clots but possible -necrolytic migratory erythema-unlikely given rarity of glucagonoma-but does have diabetes and skin condition that has been migration + itchy+ weight loss (175 in December now 165) -?blood dyscrasia-check labs -?zinc deficiency -infectious less likely as no systemic symptoms or pain/erythema on exam -pressure sores-patient is pretty mobile-stands and able to lean forward/pull up shirt without assistance  Patient declines labs today. Will place future orders for labs. Would like to prevent superimposed infection-will treat with mupirocin BID for 5 days. Whatever this is seems to be calming down and resolving. -F/u Friday with Dr. Arby Barrette -Wound care  referral -Mupirocin BID for 5 days -consider biopsy if not improving -Future lab orders placed-HIV, RPR, CMP, CBC with Diff, blood smear, glucagon       PAD (peripheral artery disease) ([place]) Has not taken eliquis since January of this year. Supposed to be on this per vascular surgery note. I refilled today and recommended reaching out to vascular office to ensure that he is supposed to be on this. -Eliquis refill -F/u with vascular recs  Gerrit Heck, MD [place]

## 2022-05-24 NOTE — Assessment & Plan Note (Signed)
Has not taken eliquis since January of this year. Supposed to be on this per vascular surgery note. I refilled today and recommended reaching out to vascular office to ensure that he is supposed to be on this. -Eliquis refill -F/u with vascular recs

## 2022-05-24 NOTE — Patient Instructions (Addendum)
It was nice to see you today!  Continue checking your blood sugars at home. If you wake up in the morning, and your blood sugar is over 100 mg/dL before you eat breakfast, take you insulin before you eat. If you wake up in the morning and your blood sugar is less than 100 mg/dL before you eat, eat breakfast and then take your insulin. Your goal blood sugar is 100-130 before eating and less than 200 after eating a meal. If you start to see readings less than 90 mg/dL in the mornings, please call the office!  Medication Changes: INCREASE Lantus (insulin glargine) to 6 units once daily in the morning  Monitor blood sugars at home and keep a log (glucometer or piece of paper) to bring with you to your next visit.  Keep up the good work with diet and exercise. Aim for a diet full of vegetables, fruit and lean meats (chicken, [place], fish). Try to limit salt intake by eating fresh or frozen vegetables (instead of canned), rinse canned vegetables prior to cooking and do not add any additional salt to meals.

## 2022-05-25 ENCOUNTER — Ambulatory Visit (HOSPITAL_COMMUNITY): Payer: PPO

## 2022-05-25 NOTE — Progress Notes (Signed)
Reviewed and agree with Dr Koval's plan.   

## 2022-05-27 ENCOUNTER — Ambulatory Visit (INDEPENDENT_AMBULATORY_CARE_PROVIDER_SITE_OTHER): Payer: PPO | Admitting: Family Medicine

## 2022-05-27 ENCOUNTER — Encounter: Payer: Self-pay | Admitting: Family Medicine

## 2022-05-27 VITALS — BP 110/80 | HR 96 | Wt 166.6 lb

## 2022-05-27 DIAGNOSIS — N1832 Chronic kidney disease, stage 3b: Secondary | ICD-10-CM | POA: Diagnosis not present

## 2022-05-27 DIAGNOSIS — E1142 Type 2 diabetes mellitus with diabetic polyneuropathy: Secondary | ICD-10-CM | POA: Diagnosis not present

## 2022-05-27 NOTE — Patient Instructions (Signed)
It was great seeing you today!  Continue to follow with the Urologist for your urinary symptoms.  Your sugars look good we will continue with the 6 units per day of insulin  Lets touch base in about a month after you see the Urologist and we can discuss referral to the kidney doctor at that time.   Feel free to call with any questions or concerns at any time, at [phone].   Take care,  Dr. Shary Key [facility]

## 2022-05-27 NOTE — Progress Notes (Unsigned)
    SUBJECTIVE:   CHIEF COMPLAINT / HPI:   Patient presents with his daughter for follow up on kidney function.   DM2: was seen by Dr. Graylin Shiver team on 3/19 and Lantus was increased from 5 to 6 units a day. Patient brought in fasting CBG log which have ranged from 116-160  CKD:  Cr worsening likely due to worsening of his CKD. Renal ultrasound ordered at last visit but patient cancelled since he has an upcoming CT ordered by Urology for his urinary symptoms. Was also prescribed sodium bicarb 650mg  BID but is not taking it. He would like to wait for nephrology referral    PERTINENT  PMH / PSH: Reviewed   OBJECTIVE:   BP 110/80   Pulse 96   Wt 166 lb 9.6 oz (75.6 kg)   SpO2 97%   BMI 23.24 kg/m    Physical exam General: well appearing, NAD Cardiovascular: RRR, no murmurs Lungs: CTAB. Normal WOB Abdomen: soft, non-distended, non-tender Skin: warm, dry. No edema  ASSESSMENT/PLAN:   Type 2 diabetes mellitus with peripheral neuropathy (HCC) Fasting CBGs having been ranging from 116-160. Recently seen by pharmacy team who increased his Lantus from 5 to 6 units daily. He has been watching his diet and eating less sweets. Will continue current regimen  CKD (chronic kidney disease) stage 3, GFR 30-59 ml/min (HCC) Cr worsening, 2.25. GFR 28. Baseline previously between 1.4-1.6. Suspect this is due to worsening of his CKD and he may be entering into stage 4 CKD. He is having some dysuria which is being worked up by Urology. Kidney function just checked about 2 weeks ago, would like to wait before doing more blood work. Using the kidney failure risk calculator patient has a low liklihood of needing dialysis so it is not unreasonable to not refer him at this time. Will continue to monitor for now   Follow up appt scheduled for 06/10/22  [place]

## 2022-05-29 NOTE — Assessment & Plan Note (Addendum)
Cr worsening, 2.25. GFR 28. Baseline previously between 1.4-1.6. Suspect this is due to worsening of his CKD and he may be entering into stage 4 CKD. He is having some dysuria which is being worked up by Urology. Kidney function just checked about 2 weeks ago, would like to wait before doing more blood work. Using the kidney failure risk calculator patient has a low liklihood of needing dialysis so it is not unreasonable to not refer him at this time. Will continue to monitor for now

## 2022-05-29 NOTE — Assessment & Plan Note (Signed)
Fasting CBGs having been ranging from 116-160. Recently seen by pharmacy team who increased his Lantus from 5 to 6 units daily. He has been watching his diet and eating less sweets. Will continue current regimen

## 2022-06-06 ENCOUNTER — Telehealth: Payer: Self-pay

## 2022-06-06 NOTE — Telephone Encounter (Signed)
Pt called stating that his toe amputation from 01/2022 had healed, but he had a hyperglycemic episode and the area has now re-opened.  Reviewed pt's chart, returned call for clarification, two identifiers used. Pt is still following the wound care guidelines discussed at his last office visit on 03/25/22. He denies any infection symptoms. Instructed pt to continue keeping the wound clean, dry, changing dressing daily, and monitoring. He will keep his appts for 4/16 unless his wound worsens or becomes infected. Confirmed understanding.

## 2022-06-10 ENCOUNTER — Ambulatory Visit (INDEPENDENT_AMBULATORY_CARE_PROVIDER_SITE_OTHER): Payer: PPO | Admitting: Family Medicine

## 2022-06-10 ENCOUNTER — Encounter: Payer: Self-pay | Admitting: Family Medicine

## 2022-06-10 VITALS — BP 124/80 | HR 85 | Wt 168.2 lb

## 2022-06-10 DIAGNOSIS — E1142 Type 2 diabetes mellitus with diabetic polyneuropathy: Secondary | ICD-10-CM | POA: Diagnosis not present

## 2022-06-10 NOTE — Patient Instructions (Addendum)
It was great seeing you today!  Im glad you are doing well!   Please have the Urologist fax [place] the results from your CT and any office notes: [phone]  Please check-out at the front desk before leaving the clinic. I'd like to see you back in June for our last follow up and A1c check, but if you need to be seen earlier than that for any new issues we're happy to fit you in, just give [place] a call!  Feel free to call with any questions or concerns at any time, at [phone].   Take care,  Dr. Cora Collum [facility]

## 2022-06-10 NOTE — Progress Notes (Unsigned)
    SUBJECTIVE:   CHIEF COMPLAINT / HPI:   Patient presents for follow up. Has been following with Urology for urinary symptoms. Patient had CT through urology on Thursday.   Brought in DM log   Declines Nephrology consult for his CKD which is reasonable given his age .   PERTINENT  PMH / PSH: ***  OBJECTIVE:   BP 124/80 (BP Location: Left Arm, Patient Position: Sitting, Cuff Size: Normal)   Pulse 85   Wt 168 lb 3.2 oz (76.3 kg)   SpO2 95%   BMI 23.46 kg/m    Physical exam General: well appearing, NAD Cardiovascular: RRR, no murmurs Lungs: CTAB. Normal WOB Abdomen: soft, non-distended, non-tender Skin: warm, dry. No edema  ASSESSMENT/PLAN:   No problem-specific Assessment & Plan notes found for this encounter.     Cora Collum, DO [facility]

## 2022-06-12 NOTE — Assessment & Plan Note (Signed)
Last a1c 9.1 in March. Brought in home glucose log which he has been checking daily and has been well controlled (scanned into chart). Currently on Jardiance 25mg  daily and lantus 5 units daily. Will continue current regimen. Advised him to schedule appt with me in June for next A1c check.

## 2022-06-15 ENCOUNTER — Other Ambulatory Visit: Payer: Self-pay | Admitting: Family Medicine

## 2022-06-16 ENCOUNTER — Other Ambulatory Visit: Payer: Self-pay | Admitting: Family Medicine

## 2022-06-16 DIAGNOSIS — L309 Dermatitis, unspecified: Secondary | ICD-10-CM

## 2022-06-21 ENCOUNTER — Ambulatory Visit (INDEPENDENT_AMBULATORY_CARE_PROVIDER_SITE_OTHER)
Admission: RE | Admit: 2022-06-21 | Discharge: 2022-06-21 | Disposition: A | Discharge status: Home / Self Care | Payer: PPO | Source: Ambulatory Visit | Attending: Vascular Surgery | Admitting: Vascular Surgery

## 2022-06-21 ENCOUNTER — Ambulatory Visit (INDEPENDENT_AMBULATORY_CARE_PROVIDER_SITE_OTHER): Payer: PPO | Admitting: Physician Assistant

## 2022-06-21 ENCOUNTER — Ambulatory Visit (HOSPITAL_COMMUNITY)
Admission: RE | Admit: 2022-06-21 | Discharge: 2022-06-21 | Disposition: A | Discharge status: Home / Self Care | Payer: PPO | Source: Ambulatory Visit | Attending: Vascular Surgery | Admitting: Vascular Surgery

## 2022-06-21 ENCOUNTER — Encounter: Payer: Self-pay | Admitting: Physician Assistant

## 2022-06-21 DIAGNOSIS — I70221 Atherosclerosis of native arteries of extremities with rest pain, right leg: Secondary | ICD-10-CM

## 2022-06-21 DIAGNOSIS — Z9889 Other specified postprocedural states: Secondary | ICD-10-CM

## 2022-06-21 DIAGNOSIS — I70235 Atherosclerosis of native arteries of right leg with ulceration of other part of foot: Secondary | ICD-10-CM | POA: Insufficient documentation

## 2022-06-21 DIAGNOSIS — I739 Peripheral vascular disease, unspecified: Secondary | ICD-10-CM | POA: Diagnosis present

## 2022-06-21 DIAGNOSIS — I7025 Atherosclerosis of native arteries of other extremities with ulceration: Secondary | ICD-10-CM

## 2022-06-21 NOTE — Progress Notes (Signed)
HISTORY AND PHYSICAL     CC:  follow up. Requesting Provider:  Cora Collum, DO  HPI: This is a 89 y.o. male who is here today for follow up for PAD.  Pt has hx of  right common femoral to plantar posterior tibial artery bypass in subcutaneous tunnel with 6 mm PTFE and right third toe amputation on 02/03/2022 by Dr. Lenell Antu.    He was doing well at his last follow up in December. His incisions were healing well. He did have dehiscence of his right 3rd toe amputation incision and there was an eschar present that w as removed. Healthy tissue appear underneath. He was given some wound care instructions and HH RN orders were also modified. His non invasive studies suggested patency of his bypass graft and he had doppler signals in the right foot.  He has a history of right superficial femoral to posterior tibial artery bypass with spliced greater saphenous vein by Dr. Elonda Husky in 2020, which allowed him to heal a wound on his first digit.  Eventually his bypass was found to be thrombosed in 2021 and he required right first and second toe amputations for nonhealing ulcerations  Pt was last seen in January and at that time, he was doing well.   The pt returns today for follow up and here with his daughter.  He states that his 3rd toe amputation site had healed but he had a spike in his blood sugar and it opened back up about a month ago.  He states it is not getting any worse.  He states that he does not have any pain in the right foot or leg.  He states that he ran out of Eliquis and had a hard time getting it refilled but is now back on this.    The pt is on a statin for cholesterol management.  The pt is on a daily aspirin.   Other AC:  Eliquis The pt is not on medication for hypertension.   The pt is diabetic.   Tobacco hx:  former  Pt does not have family hx of AAA.  Past Medical History:  Diagnosis Date   ANEMIA, IRON DEF, NOS 01/15/2007   Qualifier: Diagnosis of  By: Constance Goltz MD,  Matthew     Arthritis    Asthma    Cataract extraction status of left eye 2016   Cataract extraction status of right eye 2016   CHOLECYSTECTOMY, LAPAROSCOPIC, HX OF 04/12/2007   Qualifier: Diagnosis of  By: Burnadette Pop  MD, Trisha Mangle     COPD (chronic obstructive pulmonary disease)    Critical lower limb ischemia 10/02/2018   Diabetes mellitus without complication    Gout 2007   Hypertension    Neuromuscular disorder    Neuropathy   Neuropathy    bilateral feet   Pneumonia    Toe ulcer 09/20/2018    Past Surgical History:  Procedure Laterality Date   ABDOMINAL AORTOGRAM W/LOWER EXTREMITY N/A 10/03/2018   Procedure: ABDOMINAL AORTOGRAM W/LOWER EXTREMITY;  Surgeon: Cephus Shelling, MD;  Location: [facility];  Service: Cardiovascular;  Laterality: N/A;  Bilateral    ABDOMINAL AORTOGRAM W/LOWER EXTREMITY N/A 01/21/2022   Procedure: ABDOMINAL AORTOGRAM W/LOWER EXTREMITY;  Surgeon: Leonie Douglas, MD;  Location: [facility];  Service: Cardiovascular;  Laterality: N/A;   AMPUTATION TOE Right 02/03/2022   Procedure: RIGHT THIRD TOE AMPUTATION;  Surgeon: Leonie Douglas, MD;  Location: [facility];  Service: Vascular;  Laterality: Right;  CHOLECYSTECTOMY     FEMORAL-TIBIAL BYPASS GRAFT Right 02/03/2022   Procedure: RIGHT COMMON FEMORAL TO PEDAL ARTERY BYPASS WITH 6CM POLYTETRAFLUOROETHYLENE GRAFT;  Surgeon: Leonie Douglas, MD;  Location: [facility] OR;  Service: Vascular;  Laterality: Right;   toe ampuation Right     No Known Allergies  Current Outpatient Medications  Medication Sig Dispense Refill   albuterol (VENTOLIN HFA) 108 (90 Base) MCG/ACT inhaler Inhale 2 puffs into the lungs every 6 (six) hours as needed for wheezing or shortness of breath.     allopurinol (ZYLOPRIM) 100 MG tablet Take 1 tablet (100 mg total) by mouth daily. 30 tablet 3   apixaban (ELIQUIS) 5 MG TABS tablet Take 1 tablet (5 mg total) by mouth 2 (two) times daily. 60 tablet 0   aspirin 81 MG EC tablet  Take 81 mg by mouth daily.     atorvastatin (LIPITOR) 40 MG tablet Take 1 tablet (40 mg total) by mouth daily. 90 tablet 3   blood glucose meter kit and supplies KIT Dispense based on patient and insurance preference. Use up to four times daily as directed. 1 each 0   cycloSPORINE (RESTASIS) 0.05 % ophthalmic emulsion Place 1 drop into both eyes 2 (two) times daily. (Patient taking differently: Place 1 drop into both eyes 2 (two) times daily as needed (dry eyes).) 60 mL 3   diclofenac Sodium (VOLTAREN) 1 % GEL TAKE ON AFFECTED AREAS AS NEEDED FOR PAIN UP TO 4 TIMES A DAY 400 g 3   gabapentin (NEURONTIN) 300 MG capsule Take 1 capsule (300 mg total) by mouth daily as needed. 300 mg in the morning, 600 mg at bedtime 90 capsule 1   glucose blood (ONETOUCH VERIO) test strip TEST UP TO 4 TIMES DAILY AS DIRECTED 100 strip 3   insulin glargine (LANTUS) 100 UNIT/ML Solostar Pen Inject 5 Units into the skin daily. 3 mL 11   JARDIANCE 25 MG TABS tablet TAKE 1 TABLET (25 MG TOTAL) BY MOUTH DAILY. 90 tablet 3   levocetirizine (XYZAL) 5 MG tablet TAKE 1 TABLET BY MOUTH TWICE A DAY 180 tablet 3   Multiple Vitamins-Minerals (MULTIVITAMIN WITH MINERALS) tablet Take 1 tablet by mouth daily.     tiotropium (SPIRIVA) 18 MCG inhalation capsule INHALE 1 CAPSULE VIA HANDIHALER ONCE DAILY AT THE SAME TIME EVERY DAY 30 capsule 2   triamcinolone cream (KENALOG) 0.1 % APPLY TOPICALLY AS NEEDED (SKIN ITCHING). 30 g 0   WIXELA INHUB 250-50 MCG/ACT AEPB INHALE 1 PUFF INTO THE LUNGS 2 (TWO) TIMES DAILY. IN THE MORNING AND AT BEDTIME. 60 each 2   sodium bicarbonate 650 MG tablet Take 1 tablet (650 mg total) by mouth 2 (two) times daily. (Patient not taking: Reported on 06/21/2022) 60 tablet 1   No current facility-administered medications for this visit.    Family History  Problem Relation Age of Onset   Asthma Mother    Arthritis Sister    Alcohol abuse Sister    Cirrhosis Sister    Asthma Sister    Heart disease Brother     Stroke Brother    Diabetes Brother    Cancer Brother        colon   Stroke Brother    Heart disease Brother    Arthritis Daughter    Asthma Daughter    Diabetes Son     Social History   Socioeconomic History   Marital status: Widowed    Spouse name: Not on file   Number of  children: Not on file   Years of education: 10   Highest education level: 10th grade  Occupational History   Occupation: RetiredHotel manager Work  Tobacco Use   Smoking status: Former    Packs/day: 0.50    Years: 60.00    Additional pack years: 0.00    Total pack years: 30.00    Types: Cigars, Cigarettes    Start date: 03/08/1949    Quit date: 06/08/2011    Years since quitting: 11.0    Passive exposure: Past   Smokeless tobacco: Never  Vaping Use   Vaping Use: Never used  Substance and Sexual Activity   Alcohol use: Not Currently    Comment: Hx of use   Drug use: No   Sexual activity: Not Currently  Other Topics Concern   Not on file  Social History Narrative   Patient lives with his daughter and her husband.   Patients wife died in 07-11-2018.   Health Care POA:    Emergency Contact: Daughter- Granville Lewis [phone]   End of Life Plan:       Any pets: none   Diet: Patient has a varied diet and reports eating bacon and eggs every morning. Does not like most fruit. Likes flavored water, kool-aid.   No current exercise.     Seatbelts: Pt reports wearing seatbelt when in vehicle.   Hobbies: likes to watch [employer] and rest                Social Determinants of Health   Financial Resource Strain: Low Risk  (05/25/2021)   Overall Financial Resource Strain (CARDIA)    Difficulty of Paying Living Expenses: Not very hard  Food Insecurity: Food Insecurity Present (05/25/2021)   Hunger Vital Sign    Worried About Running Out of Food in the Last Year: Sometimes true    Ran Out of Food in the Last Year: Sometimes true  Transportation Needs: No Transportation Needs (02/08/2022)   PRAPARE - Therapist, art (Medical): No    Lack of Transportation (Non-Medical): No  Physical Activity: Inactive (05/25/2021)   Exercise Vital Sign    Days of Exercise per Week: 0 days    Minutes of Exercise per Session: 0 min  Stress: No Stress Concern Present (05/25/2021)   [employer] of Occupational Health - Occupational Stress Questionnaire    Feeling of Stress : Not at all  Social Connections: Socially Isolated (05/25/2021)   Social Connection and Isolation Panel [NHANES]    Frequency of Communication with Friends and Family: More than three times a week    Frequency of Social Gatherings with Friends and Family: More than three times a week    Attends Religious Services: Never    Database administrator or Organizations: No    Attends Banker Meetings: Never    Marital Status: Widowed  Intimate Partner Violence: Not At Risk (05/25/2021)   Humiliation, Afraid, Rape, and Kick questionnaire    Fear of Current or Ex-Partner: No    Emotionally Abused: No    Physically Abused: No    Sexually Abused: No     REVIEW OF SYSTEMS:    denotes positive finding,  denotes negative finding Cardiac  Comments:  Chest pain or chest pressure:    Shortness of breath upon exertion:    Short of breath when lying flat:    Irregular heart rhythm:        Vascular    Pain in calf,  thigh, or hip brought on by ambulation:    Pain in feet at night that wakes you up from your sleep:     Blood clot in your veins:    Leg swelling:         Pulmonary    Oxygen at home:    Productive cough:     Wheezing:         Neurologic    Sudden weakness in arms or legs:     Sudden numbness in arms or legs:     Sudden onset of difficulty speaking or slurred speech:    Temporary loss of vision in one eye:     Problems with dizziness:         Gastrointestinal    Blood in stool:     Vomited blood:         Genitourinary    Burning when urinating:     Blood in urine:        Psychiatric     Major depression:         Hematologic    Bleeding problems:    Problems with blood clotting too easily:        Skin    Rashes or ulcers: x       Constitutional    Fever or chills:      PHYSICAL EXAMINATION:   General:  WDWN in NAD; vital signs documented above Gait: Not observed HENT: WNL, normocephalic Pulmonary: normal non-labored breathing , without wheezing Cardiac: regular HR, without carotid bruits Abdomen: soft, NT; aortic pulse is not palpable Skin: without rashes Vascular Exam/Pulses:  Right Left  Radial 2+ (normal) 2+ (normal)  Femoral 2+ (normal) 2+ (normal)  Popliteal Biphasic doppler Not examined  DP absent absent  PT absent absent  Peroneal absent monophasic   Extremities: right foot with small wound at 3rd toe amputation site  Musculoskeletal: no muscle wasting or atrophy  Neurologic: A&O X 3 Psychiatric:  The pt has Normal affect.   Non-Invasive Vascular Imaging:   ABI's/TBI's on 06/21/2022: Right:  absent - Great toe pressure:  Left:  0.58/0.33 - Great toe pressure: 41  Arterial duplex on 06/21/2022: Right Graft #1: femoral to pedal bypass  +------------------+--------+--------+----------+--------+                   PSV cm/sStenosisWaveform  Comments  +------------------+--------+--------+----------+--------+  Inflow           40              monophasic          +------------------+--------+--------+----------+--------+  Prox Anastomosis  32              monophasic          +------------------+--------+--------+----------+--------+  Proximal Graft            occluded                    +------------------+--------+--------+----------+--------+  Mid Graft                 occluded                    +------------------+--------+--------+----------+--------+  Distal Graft              occluded                    +------------------+--------+--------+----------+--------+  Distal Anastomosis        occluded                     +------------------+--------+--------+----------+--------+  Outflow                                              +------------------+--------+--------+----------+--------+   Summary:  Right: No color or spectral Doppler flow observed within the bypass graft.   Previous ABI's/TBI's on 03/25/2022: Right:  [place] - Great toe pressure: amp Left:  0.85/0.31 - Great toe pressure:  37  Previous arterial duplex on 1/19/204: Right Graft #1: CFA to pedal posterior tibial  +------------------+--------+--------+----------+--------+                   PSV cm/sStenosisWaveform  Comments  +------------------+--------+--------+----------+--------+  Inflow           53              biphasic            +------------------+--------+--------+----------+--------+  Prox Anastomosis  71              biphasic            +------------------+--------+--------+----------+--------+  Proximal Graft    57              monophasic          +------------------+--------+--------+----------+--------+  Mid Graft         42              monophasic          +------------------+--------+--------+----------+--------+  Distal Graft      38              monophasic          +------------------+--------+--------+----------+--------+  Distal Anastomosis68              biphasic            +------------------+--------+--------+----------+--------+  Outflow          56              biphasic            +------------------+--------+--------+----------+--------+   Summary:  Right: Widely patent bypass graft without evidence of stenosis; however, distal graft velocities are <40 cm/s and may indicate threatened graft.     ASSESSMENT/PLAN:: 89 y.o. male here for follow up for PAD with hx of right common femoral to plantar posterior tibial artery bypass in subcutaneous tunnel with 6 mm PTFE and right third toe amputation on 02/03/2022 by Dr. Lenell Antu.    -pt RLE  bypass is occluded.  Pt has wound at 3rd toe amputation site.  He does have elevated creatinine at 2.25 and he does not want to have another arteriogram and Dr. Lenell Antu did not feel there would be any revascularization options for him.  His wound is currently clean and not infected.  He will continue to paint wound with betadine after washing with soap and water and dress daily.  He will protect his foot.  -he will return in 3 weeks for wound check with Dr. Lenell Antu.  Pt understands that he is at high risk for amputation.  He will call sooner if his wound worsens or he has any issues.     Doreatha Massed, [facility] [phone]  Clinic MD:   Lenell Antu

## 2022-06-22 LAB — VAS US ABI WITH/WO TBI
Left ABI: 0.58
Right ABI: ABSENT

## 2022-06-23 ENCOUNTER — Ambulatory Visit (INDEPENDENT_AMBULATORY_CARE_PROVIDER_SITE_OTHER): Payer: PPO | Admitting: Podiatry

## 2022-06-23 DIAGNOSIS — M79674 Pain in right toe(s): Secondary | ICD-10-CM | POA: Diagnosis not present

## 2022-06-23 DIAGNOSIS — I739 Peripheral vascular disease, unspecified: Secondary | ICD-10-CM

## 2022-06-23 DIAGNOSIS — B351 Tinea unguium: Secondary | ICD-10-CM | POA: Diagnosis not present

## 2022-06-23 DIAGNOSIS — E1151 Type 2 diabetes mellitus with diabetic peripheral angiopathy without gangrene: Secondary | ICD-10-CM

## 2022-06-23 DIAGNOSIS — L97512 Non-pressure chronic ulcer of other part of right foot with fat layer exposed: Secondary | ICD-10-CM

## 2022-06-23 DIAGNOSIS — M79675 Pain in left toe(s): Secondary | ICD-10-CM | POA: Diagnosis not present

## 2022-06-23 NOTE — Progress Notes (Signed)
  Subjective:  Patient ID: [name], male    DOB: [birth year],  MRN: [id number]  Chief Complaint  Patient presents with   Nail Problem   Toe Pain    DFC [id number]    89 y.o. male presents with the above complaint. History confirmed with patient. Patient presenting with pain related to dystrophic thickened elongated nails. Patient is unable to trim own nails related to nail dystrophy and/or mobility issues. Patient does  have a history of T2DM.  Patient does have a history of multiple amputations on the right foot.  Most recently had the third toe amputated by another doctor.  He does have a wound on the right foot at the amputation site that is being managed by vascular.   Objective:  Physical Exam: warm, good capillary refill nail exam onychomycosis of the toenails, onycholysis, and dystrophic nails protective sensation absent, diminished PT and DP pulses nonpalpable Left Foot:  Pain with palpation of nails due to elongation and dystrophic growth.  Right Foot: Pain with palpation of nails due to elongation and dystrophic growth.  Prior to first through third toe amputation Open ulceration at the third toe amputation site with healthy granular tissue present.  No erythema edema or drainage no evidence of infection   Assessment:   1. Pain due to onychomycosis of toenails of both feet   2. Right foot ulcer, with fat layer exposed   3. PAD (peripheral artery disease)   4. Type 2 diabetes mellitus with diabetic peripheral angiopathy without gangrene, without long-term current use of insulin      Plan:  Patient was evaluated and treated and all questions answered.  # Ulceration of the right foot third toe amputation site -Currently being managed by another physician believe vascular who completed the amputation -Recommend referral to wound care center.  Placed at this visit -Continue with Betadine and gauze dressing daily  #Onychomycosis with pain  -Nails palliatively debrided  as below. -Educated on self-care  Procedure: Nail Debridement Rationale: Pain Type of Debridement: manual, sharp debridement. Instrumentation: Nail nipper, rotary burr. Number of Nails: 7  No follow-ups on file.         Corinna Gab, DPM [facility] / [facility]

## 2022-06-27 ENCOUNTER — Telehealth: Payer: Self-pay | Admitting: Family Medicine

## 2022-06-27 NOTE — Telephone Encounter (Signed)
Patient walked in stating he went to the pharmacy who said he need a prescription for [facility]  5Bevel Pen needles 0.23x74mm.

## 2022-06-29 ENCOUNTER — Telehealth: Payer: Self-pay

## 2022-06-29 ENCOUNTER — Other Ambulatory Visit: Payer: Self-pay

## 2022-06-29 ENCOUNTER — Telehealth: Payer: Self-pay | Admitting: Family Medicine

## 2022-06-29 MED ORDER — INSULIN GLARGINE 100 UNIT/ML SOLOSTAR PEN: 5.0000 [IU] | PEN_INJECTOR | Freq: Every day | SUBCUTANEOUS | 11 refills | Status: AC

## 2022-06-29 NOTE — Telephone Encounter (Signed)
Patient came in asking for a refill on his needles, he only has  3 left.

## 2022-06-29 NOTE — Telephone Encounter (Signed)
Accidentally routed this to PCP, sent other encounter to refill medicine

## 2022-07-01 ENCOUNTER — Telehealth: Payer: Self-pay | Admitting: *Deleted

## 2022-07-01 MED ORDER — INSULIN PEN NEEDLE 31G X 5 MM MISC: 3 refills | Status: AC

## 2022-07-01 NOTE — Telephone Encounter (Signed)
Patient presents to clinic for pen needles. Called pharmacy and received order information. Sent prescription under preceptor (McDiarmid)  Veronda Prude, RN

## 2022-07-01 NOTE — Telephone Encounter (Signed)
Wound care nurse is wanting to let the doctor know that they did receive wound care referral but unable to accept, patient is currently being treated by [facility] for the same foot,most recent visit was  April 16 th, they can't touch unless they sign a release for them to treat, please advise.

## 2022-07-04 ENCOUNTER — Encounter (HOSPITAL_BASED_OUTPATIENT_CLINIC_OR_DEPARTMENT_OTHER): Payer: PPO | Attending: Internal Medicine | Admitting: Internal Medicine

## 2022-07-04 DIAGNOSIS — I739 Peripheral vascular disease, unspecified: Secondary | ICD-10-CM | POA: Insufficient documentation

## 2022-07-04 DIAGNOSIS — J449 Chronic obstructive pulmonary disease, unspecified: Secondary | ICD-10-CM | POA: Diagnosis not present

## 2022-07-04 DIAGNOSIS — Y835 Amputation of limb(s) as the cause of abnormal reaction of the patient, or of later complication, without mention of misadventure at the time of the procedure: Secondary | ICD-10-CM | POA: Diagnosis not present

## 2022-07-04 DIAGNOSIS — E1122 Type 2 diabetes mellitus with diabetic chronic kidney disease: Secondary | ICD-10-CM | POA: Diagnosis not present

## 2022-07-04 DIAGNOSIS — L97512 Non-pressure chronic ulcer of other part of right foot with fat layer exposed: Secondary | ICD-10-CM | POA: Diagnosis present

## 2022-07-04 DIAGNOSIS — E11621 Type 2 diabetes mellitus with foot ulcer: Secondary | ICD-10-CM | POA: Diagnosis not present

## 2022-07-04 DIAGNOSIS — Z89421 Acquired absence of other right toe(s): Secondary | ICD-10-CM | POA: Insufficient documentation

## 2022-07-04 DIAGNOSIS — T8131XA Disruption of external operation (surgical) wound, not elsewhere classified, initial encounter: Secondary | ICD-10-CM | POA: Insufficient documentation

## 2022-07-04 DIAGNOSIS — N183 Chronic kidney disease, stage 3 unspecified: Secondary | ICD-10-CM | POA: Diagnosis not present

## 2022-07-04 DIAGNOSIS — Z87891 Personal history of nicotine dependence: Secondary | ICD-10-CM | POA: Diagnosis not present

## 2022-07-04 DIAGNOSIS — Z794 Long term (current) use of insulin: Secondary | ICD-10-CM | POA: Diagnosis not present

## 2022-07-04 NOTE — Progress Notes (Signed)
[name], [name] ([id number]) [id number].pdf Page 1 of 4 Visit Report for 07/04/2022 Abuse Risk Screen Details Patient Name: Date of Service: [name] 07/04/2022 12:30 PM Medical Record Number: [id number] Patient Account Number: [id number] Date of Birth/Sex: Treating RN: [birth year] (89 y.o. [name] Primary Care Jamielyn Petrucci: PA IGE, V ICTO RIA Other Clinician: Referring Liesa Tsan: Treating Latressa Harries/Extender: [name] PA IGE, V ICTO RIA Weeks in Treatment: 0 Abuse Risk Screen Items Answer ABUSE RISK SCREEN: Has anyone close to you tried to hurt or harm you recentlyo No Do you feel uncomfortable with anyone in your familyo No Has anyone forced you do things that you didnt want to doo No Electronic Signature(s) Signed: 07/04/2022 3:16:56 PM By: Fonnie Mu RN Entered By: Fonnie Mu on 07/04/2022 12:46:44 -------------------------------------------------------------------------------- Activities of Daily Living Details Patient Name: Date of Service: [name] 07/04/2022 12:30 PM Medical Record Number: [id number] Patient Account Number: [id number] Date of Birth/Sex: Treating RN: 05-05-1933 (89 y.o. [name] Primary Care Murdock Jellison: PA IGE, V ICTO RIA Other Clinician: Referring Malissie Musgrave: Treating Tonetta Napoles/Extender: [name] PA IGE, V ICTO RIA Weeks in Treatment: 0 Activities of Daily Living Items Answer Activities of Daily Living (Please select one for each item) Drive Automobile Completely Able T Medications ake Completely Able Use T elephone Completely Able Care for Appearance Completely Able Use T oilet Completely Able Bath / Shower Completely Able Dress Self Completely Able Feed Self Completely Able Walk Completely Able Get In / Out Bed Completely Able Housework Completely Able Prepare Meals Completely Able Handle Money Completely Able Shop for Self Completely  Able Electronic Signature(s) Signed: 07/04/2022 3:16:56 PM By: Fonnie Mu RN Entered By: Fonnie Mu on 07/04/2022 12:46:59 -------------------------------------------------------------------------------- Education Screening Details Patient Name: Date of Service: [name] 07/04/2022 12:30 PM Medical Record Number: [id number] Patient Account Number: [id number] Date of Birth/Sex: Treating RN: 1933-09-09 (89 y.o. [name] Primary Care Dajanique Robley: PA IGE, V ICTO RIA Other Clinician: Referring Rhyland Hinderliter: Treating Katie Faraone/Extender: [name] PA IGE, V ICTO RIA Weeks in Treatment: 0 [name], [name] ([id number]) [id number] Page 2 of 4 Primary Learner Assessed: Patient Learning Preferences/Education Level/Primary Language Learning Preference: Explanation, Demonstration, Communication Board, Printed Material Highest Education Level: High School Preferred Language: Economist Language Barrier: No Translator Needed: No Memory Deficit: No Emotional Barrier: No Cultural/Religious Beliefs Affecting Medical Care: No Physical Barrier Impaired Vision: Yes Glasses Impaired Hearing: Yes Decreased Hand dexterity: No Knowledge/Comprehension Knowledge Level: High Comprehension Level: High Ability to understand written instructions: High Ability to understand verbal instructions: High Motivation Anxiety Level: Calm Cooperation: Cooperative Education Importance: Denies Need Interest in Health Problems: Asks Questions Perception: Coherent Willingness to Engage in Self-Management High Activities: Readiness to Engage in Self-Management High Activities: Electronic Signature(s) Signed: 07/04/2022 3:16:56 PM By: Fonnie Mu RN Entered By: Fonnie Mu on 07/04/2022 12:47:26 -------------------------------------------------------------------------------- Fall Risk Assessment Details Patient  Name: Date of Service: [name] 07/04/2022 12:30 PM Medical Record Number: [id number] Patient Account Number: [id number] Date of Birth/Sex: Treating RN: [birth year] (89 y.o. [name], Lauren Primary Care Keyana Guevara: PA IGE, V ICTO RIA Other Clinician: Referring Aston Lawhorn: Treating Larosa Rhines/Extender: [name] PA IGE, V ICTO RIA Weeks in Treatment: 0 Fall Risk Assessment Items Have you had 2 or more falls in the last 12 monthso 0 No Have you had any fall that resulted in injury in the last 12 monthso 0 No FALLS RISK SCREEN History of falling - immediate or within 3 months 0  No Secondary diagnosis (Do you have 2 or more medical diagnoseso) 0 No Ambulatory aid None/bed rest/wheelchair/nurse 0 No Crutches/cane/walker 0 No Furniture 0 No Intravenous therapy Access/Saline/Heparin Lock 0 No Gait/Transferring Normal/ bed rest/ wheelchair 0 No Weak (short steps with or without shuffle, stooped but able to lift head while walking, may seek 0 No support from furniture) Impaired (short steps with shuffle, may have difficulty arising from chair, head down, impaired 0 No balance) Mental Status Oriented to own ability 0 No Overestimates or forgets limitations 0 No Risk Level: Low Risk Score: 0 [name], [name] ([id number]) [id number] Page 3 of 4 Electronic Signature(s) -------------------------------------------------------------------------------- Foot Assessment Details Patient Name: Date of Service: [name] 07/04/2022 12:30 PM Medical Record Number: [id number] Patient Account Number: [id number] Date of Birth/Sex: Treating RN: Nov 17, 1933 (89 y.o. [name] Primary Care Fiona Coto: PA IGE, V ICTO RIA Other Clinician: Referring Alira Fretwell: Treating Makeisha Jentsch/Extender: [name] PA IGE, V ICTO RIA Weeks in Treatment: 0 Foot Assessment Items Site Locations + = Sensation present, - = Sensation absent, C =  Callus, U = Ulcer R = Redness, W = Warmth, M = Maceration, PU = Pre-ulcerative lesion F = Fissure, S = Swelling, D = Dryness Assessment Right: Left: Other Deformity: No No Prior Foot Ulcer: No No Prior Amputation: No No Charcot Joint: No No Ambulatory Status: Ambulatory Without Help Gait: Steady Electronic Signature(s) Signed: 07/04/2022 3:16:56 PM By: Fonnie Mu RN Entered By: Fonnie Mu on 07/04/2022 13:00:10 -------------------------------------------------------------------------------- Nutrition Risk Screening Details Patient Name: Date of Service: [name] 07/04/2022 12:30 PM Medical Record Number: [id number] Patient Account Number: [id number] Date of Birth/Sex: Treating RN: 1934/02/14 (89 y.o. [name] Primary Care Michel Hendon: PA IGE, V ICTO RIA Other Clinician: Referring Namari Breton: Treating Khoury Siemon/Extender: [name] PA IGE, V ICTO RIA Weeks in Treatment: 0 Height (in): 71 Weight (lbs): 181 Body Mass Index (BMI): 25.2 [name], [name] ([id number]) [id number] Page 4 of 4 Nutrition Risk Screening Items Score Screening NUTRITION RISK SCREEN: I have an illness or condition that made me change the kind and/or amount of food I eat 0 No I eat fewer than two meals per day 0 No I eat few fruits and vegetables, or milk products 0 No I have three or more drinks of beer, liquor or wine almost every day 0 No I have tooth or mouth problems that make it hard for me to eat 0 No I don't always have enough money to buy the food I need 0 No I eat alone most of the time 0 No I take three or more different prescribed or over-the-counter drugs a day 0 No Without wanting to, I have lost or gained 10 pounds in the last six months 0 No I am not always physically able to shop, cook and/or feed myself 0 No Nutrition Protocols Good Risk Protocol 0 No interventions needed Moderate Risk Protocol High Risk  Proctocol Risk Level: Good Risk Score: 0 Electronic Signature(s) Signed: 07/04/2022 3:16:56 PM By: Fonnie Mu RN Entered By: Fonnie Mu on 07/04/2022 12:47:38

## 2022-07-05 NOTE — Progress Notes (Signed)
[name], [name] ([id number]) [id number] Page 1 of 7 Visit Report for 07/04/2022 Allergy List Details Patient Name: Date of Service: [name], [name] 07/04/2022 12:30 PM Medical Record Number: [id number] Patient Account Number: [id number] Date of Birth/Sex: Treating RN: 02-15-1934 (89 y.o. [name] Primary Care Jomarion Mish: PA IGE, V ICTO RIA Other Clinician: Referring [name]: Treating [name]/Extender: [name] PA IGE, V ICTO RIA Weeks in Treatment: 0 Allergies Active Allergies No Known Allergies Allergy Notes Electronic Signature(s) Signed: 07/04/2022 3:16:56 PM By: Fonnie Mu RN Entered By: Fonnie Mu on 07/01/2022 12:28:45 -------------------------------------------------------------------------------- Arrival Information Details Patient Name: Date of Service: [name] [name] 07/04/2022 12:30 PM Medical Record Number: [id number] Patient Account Number: [id number] Date of Birth/Sex: Treating RN: Jul 25, 1933 (89 y.o. [name], [name] Primary Care Tayvian Holycross: PA IGE, V ICTO RIA Other Clinician: Referring Brittni Hult: Treating [name]/Extender: [name] PA IGE, V ICTO RIA Weeks in Treatment: 0 Visit Information Patient Arrived: Ambulatory Arrival Time: 12:45 Accompanied By: daughter Transfer Assistance: None Patient Identification Verified: Yes Secondary Verification Process Completed: Yes Patient Requires Transmission-Based Precautions: No Patient Has Alerts: Yes Patient Alerts: Patient on Blood Thinner History Since Last Visit Added or deleted any medications: No Any new allergies or adverse reactions: No Had a fall or experienced change in activities of daily living that may affect risk of falls: No Signs or symptoms of abuse/neglect since last visito No Hospitalized since last visit: No Implantable device outside of the clinic excluding cellular tissue based products placed in the center since  last visit: No Pain Present Now: No Electronic Signature(s) Signed: 07/04/2022 3:16:56 PM By: Fonnie Mu RN Entered By: Fonnie Mu on 07/04/2022 12:46:01 -------------------------------------------------------------------------------- Clinic Level of Care Assessment Details Patient Name: Date of Service: [name] [name] 07/04/2022 12:30 PM Medical Record Number: [id number] Patient Account Number: [id number] Date of Birth/Sex: Treating RN: Jul 08, 1933 (89 y.o. [name] Primary Care [name]: PA IGE, V ICTO RIA Other Clinician: Referring Myia Bergh: Treating [name]/Extender: [name] PA IGE, V ICTO RIA Weeks in Treatment: 0 Clinic Level of Care Assessment Items TOOL 4 Quantity Score X- 1 0 Use when only an EandM is performed on FOLLOW-UP visit ASSESSMENTS - Nursing Assessment / Reassessment [name], [name] ([id number]) [id number] Page 2 of 7 X- 1 10 Reassessment of Co-morbidities (includes updates in patient status) X- 1 5 Reassessment of Adherence to Treatment Plan ASSESSMENTS - Wound and Skin A ssessment / Reassessment X - Simple Wound Assessment / Reassessment - one wound 1 5 []  - 0 Complex Wound Assessment / Reassessment - multiple wounds []  - 0 Dermatologic / Skin Assessment (not related to wound area) ASSESSMENTS - Focused Assessment X- 1 5 Circumferential Edema Measurements - multi extremities []  - 0 Nutritional Assessment / Counseling / Intervention []  - 0 Lower Extremity Assessment (monofilament, tuning fork, pulses) []  - 0 Peripheral Arterial Disease Assessment (using hand held doppler) ASSESSMENTS - Ostomy and/or Continence Assessment and Care []  - 0 Incontinence Assessment and Management []  - 0 Ostomy Care Assessment and Management (repouching, etc.) PROCESS - Coordination of Care X - Simple Patient / Family Education for ongoing care 1 15 []  - 0 Complex (extensive) Patient / Family Education  for ongoing care X- 1 10 Staff obtains Chiropractor, Records, T Results / Process Orders est []  - 0 Staff telephones HHA, Nursing Homes / Clarify orders / etc []  - 0 Routine Transfer to another Facility (non-emergent condition) []  - 0 Routine Hospital Admission (non-emergent condition) X- 1 15 New Admissions / Manufacturing engineer / Ordering  NPWT Apligraf, etc. , []  - 0 Emergency Hospital Admission (emergent condition) X- 1 10 Simple Discharge Coordination []  - 0 Complex (extensive) Discharge Coordination PROCESS - Special Needs []  - 0 Pediatric / Minor Patient Management []  - 0 Isolation Patient Management []  - 0 Hearing / Language / Visual special needs []  - 0 Assessment of Community assistance (transportation, D/C planning, etc.) []  - 0 Additional assistance / Altered mentation []  - 0 Support Surface(s) Assessment (bed, cushion, seat, etc.) INTERVENTIONS - Wound Cleansing / Measurement X - Simple Wound Cleansing - one wound 1 5 []  - 0 Complex Wound Cleansing - multiple wounds X- 1 5 Wound Imaging (photographs - any number of wounds) []  - 0 Wound Tracing (instead of photographs) X- 1 5 Simple Wound Measurement - one wound []  - 0 Complex Wound Measurement - multiple wounds INTERVENTIONS - Wound Dressings X - Small Wound Dressing one or multiple wounds 1 10 []  - 0 Medium Wound Dressing one or multiple wounds []  - 0 Large Wound Dressing one or multiple wounds X- 1 5 Application of Medications - topical []  - 0 Application of Medications - injection [name], [name] ([id number]) [id number] Page 3 of 7 INTERVENTIONS - Miscellaneous []  - 0 External ear exam []  - 0 Specimen Collection (cultures, biopsies, blood, body fluids, etc.) []  - 0 Specimen(s) / Culture(s) sent or taken to Lab for analysis []  - 0 Patient Transfer (multiple staff / Michiel Sites Lift / Similar devices) []  - 0 Simple Staple / Suture removal (25 or less) []  -  0 Complex Staple / Suture removal (26 or more) []  - 0 Hypo / Hyperglycemic Management (close monitor of Blood Glucose) []  - 0 Ankle / Brachial Index (ABI) - do not check if billed separately X- 1 5 Vital Signs Has the patient been seen at the hospital within the last three years: Yes Total Score: 110 Level Of Care: New/Established - Level 3 Electronic Signature(s) Signed: 07/04/2022 3:16:56 PM By: Fonnie Mu RN Entered By: Fonnie Mu on 07/04/2022 13:34:31 -------------------------------------------------------------------------------- Lower Extremity Assessment Details Patient Name: Date of Service: [name] [name] 07/04/2022 12:30 PM Medical Record Number: [id number] Patient Account Number: [id number] Date of Birth/Sex: Treating RN: [birth year] (89 y.o. [name] Primary Care Breanne Olvera: PA IGE, V ICTO RIA Other Clinician: Referring Granger Chui: Treating Basil Blakesley/Extender: [name] PA IGE, V ICTO RIA Weeks in Treatment: 0 Edema Assessment Assessed: [Left: No] [Right: Yes] Edema: [Left: N] [Right: o] Calf Left: Right: Point of Measurement: From Medial Instep 34.5 cm Ankle Left: Right: Point of Measurement: From Medial Instep 21.5 cm Vascular Assessment Pulses: Dorsalis Pedis Palpable: [Right:Yes] Posterior Tibial Palpable: [Right:Yes] Electronic Signature(s) Signed: 07/04/2022 3:16:56 PM By: Fonnie Mu RN Entered By: Fonnie Mu on 07/04/2022 12:56:04 -------------------------------------------------------------------------------- Multi Wound Chart Details Patient Name: Date of Service: [name] [name] 07/04/2022 12:30 PM Medical Record Number: [id number] Patient Account Number: [id number] Date of Birth/Sex: Treating RN: [birth year] (89 y.o. M) Primary Care Heiress Williamson: PA IGE, V ICTO RIA Other Clinician: Referring Kerria Sapien: Treating Ahad Colarusso/Extender: [name] PA IGE, V ICTO RIA Beaumont, Siah ([id number])  [phone].pdf Page 4 of 7 Weeks in Treatment: 0 Vital Signs Height(in): Capillary Blood Glucose(mg/dl): 433 Weight(lbs): Pulse(bpm): 74 Body Mass Index(BMI): Blood Pressure(mmHg): 166/92 Temperature(F): 98.7 Respiratory Rate(breaths/min): 17 [3:Photos: No Photos Right Amputation Site - Toe Wound Location: Gradually Appeared Wounding Event: Diabetic Wound/Ulcer of the Lower Primary Etiology: Extremity Cataracts, Anemia, Asthma, Chronic N/A Comorbid History: Obstructive Pulmonary Disease (COPD),  Peripheral Arterial Disease, Type II Diabetes, Gout,  Osteoarthritis, Neuropathy 04/07/2022 Date Acquired: 0 Weeks of Treatment: Open Wound Status: No Wound Recurrence: 2.7x1.5x0.3 Measurements L x W x D (cm) 3.181 A (cm) : rea 0.954 Volume (cm) : Grade 1  Classification: Medium Exudate A mount: Serosanguineous Exudate Type: red, brown Exudate Color: Epibole Wound Margin: Large (67-100%) Granulation A mount: Red, Pink Granulation Quality: Small (1-33%) Necrotic A mount: Fat Layer (Subcutaneous Tissue):  Yes N/A Exposed Structures: Fascia: No Tendon: No Muscle: No Joint: No Bone: No Small (1-33%) Epithelialization: Excoriation: No Periwound Skin Texture: Induration: No Callus: No Crepitus: No Rash: No Scarring: No Maceration: No Periwound Skin Moisture:  Dry/Scaly: No Atrophie Blanche: No Periwound Skin Color: Cyanosis: No Ecchymosis: No Erythema: No Hemosiderin Staining: No Mottled: No Pallor: No Rubor: No No Abnormality Temperature:] [N/A:N/A N/A N/A N/A N/A N/A N/A N/A N/A N/A N/A N/A N/A N/A N/A N/A  N/A N/A N/A N/A N/A N/A N/A N/A] Treatment Notes Electronic Signature(s) Signed: 07/04/2022 3:46:02 PM By: [name] DO Entered By: [name] on 07/04/2022 14:08:35 -------------------------------------------------------------------------------- Multi-Disciplinary Care Plan Details Patient Name: Date of Service: [name] [name] 07/04/2022 12:30 PM Medical  Record Number: [id number] Patient Account Number: [id number] Date of Birth/Sex: Treating RN: Sep 25, 1933 (89 y.o. [name] Primary Care Chyanne Kohut: PA IGE, V ICTO RIA Other Clinician: Referring Aerie Donica: Treating Javante Nilsson/Extender: [name] PA IGE, V ICTO RIA Weeks in Treatment: 0 Railey, Gradyn ([id number]) [id number] Page 5 of 7 Active Inactive Orientation to the Wound Care Program Nursing Diagnoses: Knowledge deficit related to the wound healing center program Goals: Patient/caregiver will verbalize understanding of the Wound Healing Center Program Date Initiated: 07/04/2022 Target Resolution Date: 07/14/2022 Goal Status: Active Interventions: Provide education on orientation to the wound center Notes: Wound/Skin Impairment Nursing Diagnoses: Impaired tissue integrity Knowledge deficit related to ulceration/compromised skin integrity Goals: Patient will have a decrease in wound volume by X% from date: (specify in notes) Date Initiated: 07/04/2022 Target Resolution Date: 07/15/2022 Goal Status: Active Patient/caregiver will verbalize understanding of skin care regimen Date Initiated: 07/04/2022 Target Resolution Date: 07/16/2022 Goal Status: Active Ulcer/skin breakdown will have a volume reduction of 30% by week 4 Date Initiated: 07/04/2022 Target Resolution Date: 07/16/2022 Goal Status: Active Ulcer/skin breakdown will have a volume reduction of 50% by week 8 Date Initiated: 07/04/2022 Target Resolution Date: 07/16/2022 Goal Status: Active Interventions: Assess patient/caregiver ability to obtain necessary supplies Assess patient/caregiver ability to perform ulcer/skin care regimen upon admission and as needed Assess ulceration(s) every visit Notes: Electronic Signature(s) Signed: 07/04/2022 3:16:56 PM By: Fonnie Mu RN Entered By: Fonnie Mu on 07/04/2022  13:33:27 -------------------------------------------------------------------------------- Pain Assessment Details Patient Name: Date of Service: [name] [name] 07/04/2022 12:30 PM Medical Record Number: [id number] Patient Account Number: [id number] Date of Birth/Sex: Treating RN: 08/15/1933 (89 y.o. [name] Primary Care Synia Douglass: PA IGE, V ICTO RIA Other Clinician: Referring Chesky Heyer: Treating Cathlyn Tersigni/Extender: [name] PA IGE, V ICTO RIA Weeks in Treatment: 0 Active Problems Location of Pain Severity and Description of Pain Patient Has Paino No Site Locations Mildred, Jeno ([id number]) [id number] Page 6 of 7 Pain Management and Medication Current Pain Management: Electronic Signature(s) Signed: 07/04/2022 3:16:56 PM By: Fonnie Mu RN Entered By: Fonnie Mu on 07/04/2022 12:47:46 -------------------------------------------------------------------------------- Patient/Caregiver Education Details Patient Name: Date of Service: [name] [name] 4/29/2024andnbsp12:30 PM Medical Record Number: [id number] Patient Account Number: [id number] Date of Birth/Gender: Treating RN: 1933-09-18 (89 y.o. [name] Primary Care Physician: PA IGE, V ICTO RIA Other Clinician: Referring Physician:  Treating Physician/Extender: [name] PA IGE, V ICTO RIA Weeks in Treatment: 0 Education Assessment Education Provided To: Patient Education Topics Provided Wound/Skin Impairment: Methods: Explain/Verbal Responses: Reinforcements needed, State content correctly Electronic Signature(s) Signed: 07/04/2022 3:16:56 PM By: Fonnie Mu RN Entered By: Fonnie Mu on 07/04/2022 13:33:36 -------------------------------------------------------------------------------- Wound Assessment Details Patient Name: Date of Service: [name] [name] 07/04/2022 12:30 PM Medical Record Number:  [id number] Patient Account Number: [id number] Date of Birth/Sex: Treating RN: 1934-01-03 (89 y.o. [name] Primary Care Brealynn Contino: PA IGE, V ICTO RIA Other Clinician: Referring Alezandra Egli: Treating Burlon Centrella/Extender: [name] PA IGE, V ICTO RIA Weeks in Treatment: 0 Wound Status Wound Number: 3 Primary Diabetic Wound/Ulcer of the Lower Extremity Etiology: Wound Location: Right Amputation Site - Toe Wound Open Wounding Event: Gradually Appeared Status: Date Acquired: 04/07/2022 Comorbid Cataracts, Anemia, Asthma, Chronic Obstructive Pulmonary Weeks Of Treatment: 0 History: Disease (COPD), Peripheral Arterial Disease, Type II Diabetes, Clustered Wound: No Gout, Osteoarthritis, Neuropathy Chaplin, Finley ([id number]) [id number] Page 7 of 7 Wound Measurements Length: (cm) 2.7 Width: (cm) 1.5 Depth: (cm) 0.3 Area: (cm) 3.181 Volume: (cm) 0.954 % Reduction in Area: % Reduction in Volume: Epithelialization: Small (1-33%) Tunneling: No Undermining: No Wound Description Classification: Grade 1 Wound Margin: Epibole Exudate Amount: Medium Exudate Type: Serosanguineous Exudate Color: red, brown Foul Odor After Cleansing: No Slough/Fibrino Yes Wound Bed Granulation Amount: Large (67-100%) Exposed Structure Granulation Quality: Red, Pink Fascia Exposed: No Necrotic Amount: Small (1-33%) Fat Layer (Subcutaneous Tissue) Exposed: Yes Necrotic Quality: Adherent Slough Tendon Exposed: No Muscle Exposed: No Joint Exposed: No Bone Exposed: No Periwound Skin Texture Texture Color No Abnormalities Noted: No No Abnormalities Noted: No Callus: No Atrophie Blanche: No Crepitus: No Cyanosis: No Excoriation: No Ecchymosis: No Induration: No Erythema: No Rash: No Hemosiderin Staining: No Scarring: No Mottled: No Pallor: No Moisture Rubor: No No Abnormalities Noted: No Dry / Scaly: No Temperature / Pain Maceration:  No Temperature: No Abnormality Electronic Signature(s) Signed: 07/04/2022 3:16:56 PM By: Fonnie Mu RN Entered By: Fonnie Mu on 07/04/2022 13:32:55 -------------------------------------------------------------------------------- Vitals Details Patient Name: Date of Service: [name] [name] 07/04/2022 12:30 PM Medical Record Number: [id number] Patient Account Number: [id number] Date of Birth/Sex: Treating RN: [birth year] (89 y.o. [name], [name] Primary Care Deronda Christian: PA IGE, V ICTO RIA Other Clinician: Referring Shelbi Vaccaro: Treating Xayne Brumbaugh/Extender: [name] PA IGE, V ICTO RIA Weeks in Treatment: 0 Vital Signs Time Taken: 13:45 Temperature (F): 98.7 Pulse (bpm): 74 Respiratory Rate (breaths/min): 17 Blood Pressure (mmHg): 166/92 Capillary Blood Glucose (mg/dl): 086 Reference Range: 80 - 120 mg / dl Electronic Signature(s) Signed: 07/04/2022 3:16:56 PM By: Fonnie Mu RN Entered By: Fonnie Mu on 07/04/2022 13:45:40

## 2022-07-05 NOTE — Progress Notes (Signed)
[name], [name] ([id number]) [id number] Page 1 of 7 Visit Report for 07/04/2022 Chief Complaint Document Details Patient Name: Date of Service: [name] 07/04/2022 12:30 PM Medical Record Number: [id number] Patient Account Number: [id number] Date of Birth/Sex: Treating RN: 02/09/1934 (89 y.o. M) Primary Care Provider: PA IGE, V ICTO RIA Other Clinician: Referring Provider: Treating Provider/Extender: [name] PA IGE, V ICTO RIA Weeks in Treatment: 0 Information Obtained from: Patient Chief Complaint 07/04/2022; right foot wound Electronic Signature(s) Signed: 07/04/2022 3:46:02 PM By: [name] DO Entered By: [name] on 07/04/2022 14:08:45 -------------------------------------------------------------------------------- HPI Details Patient Name: Date of Service: [name] 07/04/2022 12:30 PM Medical Record Number: [id number] Patient Account Number: [id number] Date of Birth/Sex: Treating RN: [birth year] (89 y.o. M) Primary Care Provider: PA IGE, V ICTO RIA Other Clinician: Referring Provider: Treating Provider/Extender: [name] PA IGE, V ICTO RIA Weeks in Treatment: 0 History of Present Illness HPI Description: 07/04/2022 [name] is an 89 year old male with a past medical history of uncontrolled, insulin dependent type 2 diabetes, CKD stage III, and COPD that presents to the clinic for a right foot wound. He has an extensive history of peripheral arterial disease to the right leg. He has had a right superficial femoral to posterior tibial artery bypass by Dr. Elonda Husky in 2020. He had a right common femoral to plantar posterior tibial artery bypass on 02/03/2022 by Dr. Lenell Antu and at that time he also had a right toe amputation. He states this wound site had healed completely. Prior to this he had amputations to the first and second digit to the right foot. He presents today with an open  wound to the previous right toe amputation site for the past 2 months. He has been following with vein and vascular for this issue. Most recent ABIs were on 06/22/2022 that showed absent waveforms in the right lower extremity. Currently his right lower extremity bypass is occluded and no plan for another anteriorgram and has he likely has no revascularization options. Currently he has been using Betadine to the wound bed. He denies signs of infection. Electronic Signature(s) Signed: 07/04/2022 3:46:02 PM By: [name] DO Entered By: [name] on 07/04/2022 14:32:39 -------------------------------------------------------------------------------- Physical Exam Details Patient Name: Date of Service: [name] 07/04/2022 12:30 PM Medical Record Number: [id number] Patient Account Number: [id number] Date of Birth/Sex: Treating RN: 05-15-1933 (89 y.o. M) Primary Care Provider: PA IGE, V ICTO RIA Other Clinician: Referring Provider: Treating Provider/Extender: [name] PA IGE, V ICTO RIA Weeks in Treatment: 0 Constitutional respirations regular, non-labored and within target range for patient.Marland Kitchen Psychiatric pleasant and cooperative. Notes [name], [name] ([id number]) [id number] Page 2 of 7 Right foot: T the previous third toe amputation site there is an open wound with granulation tissue. Difficult to palpate pedal pulses. Foot is appropriately o warm. No signs of surrounding infection including increased warmth, erythema or purulent drainage. Electronic Signature(s) Signed: 07/04/2022 3:46:02 PM By: [name] DO Entered By: [name] on 07/04/2022 14:32:45 -------------------------------------------------------------------------------- Physician Orders Details Patient Name: Date of Service: [name] 07/04/2022 12:30 PM Medical Record Number: [id number] Patient Account Number: [id number] Date of  Birth/Sex: Treating RN: [birth year] (89 y.o. [name] Primary Care Provider: PA IGE, V ICTO RIA Other Clinician: Referring Provider: Treating Provider/Extender: [name] PA IGE, V ICTO RIA Weeks in Treatment: 0 Verbal / Phone Orders: No Diagnosis Coding ICD-10 Coding Code Description L97.512 Non-pressure chronic ulcer of other part of right foot  with fat layer exposed E11.621 Type 2 diabetes mellitus with foot ulcer I73.9 Peripheral vascular disease, unspecified T81.31XA Disruption of external operation (surgical) wound, not elsewhere classified, initial encounter Follow-up Appointments ppointment in 1 week. - w/ Dr. Mikey Bussing and Veneda Melter # 9 Monday 07/11/22 @ 12:30 Return A Anesthetic (In clinic) Topical Lidocaine 5% applied to wound bed Bathing/ Shower/ Hygiene May shower with protection but do not get wound dressing(s) wet. Protect dressing(s) with water repellant cover (for example, large plastic bag) or a cast cover and may then take shower. Edema Control - Lymphedema / SCD / Other Elevate legs to the level of the heart or above for 30 minutes daily and/or when sitting for 3-4 times a day throughout the day. Avoid standing for long periods of time. Wound Treatment Wound #3 - Amputation Site - Toe Wound Laterality: Right Cleanser: Vashe 5.8 (oz) 1 x Per Day/15 Days Discharge Instructions: Cleanse the wound with Vashe prior to applying a clean dressing using gauze sponges, not tissue or cotton balls. Prim Dressing: Hydrofera Blue Ready Transfer Foam, 4x5 (in/in) (DME) (Generic) 1 x Per Day/15 Days ary Discharge Instructions: Apply to wound bed as instructed Secondary Dressing: Woven Gauze Sponge, Non-Sterile 4x4 in (Generic) 1 x Per Day/15 Days Discharge Instructions: Apply over primary dressing as directed. Secured With: 30M Medipore H Soft Cloth Surgical T ape, 4 x 10 (in/yd) (DME) (Generic) 1 x Per Day/15 Days Discharge Instructions: Secure with tape as  directed. Electronic Signature(s) Signed: 07/04/2022 3:46:02 PM By: [name] DO Entered By: [name] on 07/04/2022 14:32:56 -------------------------------------------------------------------------------- Problem List Details Patient Name: Date of Service: [name] 07/04/2022 12:30 PM Medical Record Number: [id number] Patient Account Number: [id number] Date of Birth/Sex: Treating RN: [birth year] (89 y.o. M) Primary Care Provider: PA IGE, V ICTO RIA Other Clinician: Referring Provider: Treating Provider/Extender: [name] PA IGE, V ICTO RIA Weeks in Treatment: 0 Glantz, Emily ([id number]) [id number] Page 3 of 7 Active Problems ICD-10 Encounter Code Description Active Date MDM Diagnosis L97.512 Non-pressure chronic ulcer of other part of right foot with fat layer exposed 07/04/2022 No Yes E11.621 Type 2 diabetes mellitus with foot ulcer 07/04/2022 No Yes I70.235 Atherosclerosis of native arteries of right leg with ulceration of other part of 07/04/2022 No Yes foot I73.9 Peripheral vascular disease, unspecified 07/04/2022 No Yes Inactive Problems Resolved Problems Electronic Signature(s) Signed: 07/04/2022 3:46:02 PM By: [name] DO Entered By: [name] on 07/04/2022 14:08:30 -------------------------------------------------------------------------------- Progress Note Details Patient Name: Date of Service: [name] 07/04/2022 12:30 PM Medical Record Number: [id number] Patient Account Number: [id number] Date of Birth/Sex: Treating RN: 04/13/1933 (89 y.o. M) Primary Care Provider: PA IGE, V ICTO RIA Other Clinician: Referring Provider: Treating Provider/Extender: [name] PA IGE, V ICTO RIA Weeks in Treatment: 0 Subjective Chief Complaint Information obtained from Patient 07/04/2022; right foot wound History of Present Illness (HPI) 07/04/2022 [name] is an  89 year old male with a past medical history of uncontrolled, insulin dependent type 2 diabetes, CKD stage III, and COPD that presents to the clinic for a right foot wound. He has an extensive history of peripheral arterial disease to the right leg. He has had a right superficial femoral to posterior tibial artery bypass by Dr. Elonda Husky in 2020. He had a right common femoral to plantar posterior tibial artery bypass on 02/03/2022 by Dr. Lenell Antu and at that time he also had a right toe amputation. He states this wound site had healed completely. Prior to  this he had amputations to the first and second digit to the right foot. He presents today with an open wound to the previous right toe amputation site for the past 2 months. He has been following with vein and vascular for this issue. Most recent ABIs were on 06/22/2022 that showed absent waveforms in the right lower extremity. Currently his right lower extremity bypass is occluded and no plan for another anteriorgram and has he likely has no revascularization options. Currently he has been using Betadine to the wound bed. He denies signs of infection. Patient History Information obtained from Patient. Allergies No Known Allergies Family History Cancer - Siblings, Diabetes - Siblings,Child, Heart Disease - Siblings, Hypertension - Father,Siblings, Lung Disease - Siblings, Stroke - Siblings, No family history of Hereditary Spherocytosis, Kidney Disease, Seizures, Thyroid Problems, Tuberculosis. Social History Former smoker - quit 10 yrs ago, Marital Status - Married, Alcohol Use - Never, Drug Use - No History, Caffeine Use - Daily - coffee. Medical History Eyes Patient has history of Cataracts - removed both eyes [name], [name] ([id number]) [id number] Page 4 of 7 Denies history of Glaucoma, Optic Neuritis Ear/Nose/Mouth/Throat Denies history of Chronic sinus problems/congestion, Middle ear  problems Hematologic/Lymphatic Patient has history of Anemia Denies history of Hemophilia, Human Immunodeficiency Virus, Lymphedema, Sickle Cell Disease Respiratory Patient has history of Asthma, Chronic Obstructive Pulmonary Disease (COPD) Denies history of Aspiration, Pneumothorax, Sleep Apnea, Tuberculosis Cardiovascular Patient has history of Peripheral Arterial Disease Endocrine Patient has history of Type II Diabetes - prediabetes Denies history of Type I Diabetes Genitourinary Denies history of End Stage Renal Disease Integumentary (Skin) Denies history of History of Burn Musculoskeletal Patient has history of Gout, Osteoarthritis Denies history of Rheumatoid Arthritis, Osteomyelitis Neurologic Patient has history of Neuropathy Oncologic Denies history of Received Chemotherapy, Received Radiation Psychiatric Denies history of Anorexia/bulimia, Confinement Anxiety Hospitalization/Surgery History - cholecystectomy. Medical A Surgical History Notes nd Ear/Nose/Mouth/Throat hard of hearing Objective Constitutional respirations regular, non-labored and within target range for patient.. Vitals Time Taken: 1:45 PM, Temperature: 98.7 F, Pulse: 74 bpm, Respiratory Rate: 17 breaths/min, Blood Pressure: 166/92 mmHg, Capillary Blood Glucose: 112 mg/dl. Psychiatric pleasant and cooperative. General Notes: Right foot: T the previous third toe amputation site there is an open wound with granulation tissue. Difficult to palpate pedal pulses. Foot is o appropriately warm. No signs of surrounding infection including increased warmth, erythema or purulent drainage. Integumentary (Hair, Skin) Wound #3 status is Open. Original cause of wound was Gradually Appeared. The date acquired was: 04/07/2022. The wound is located on the Right Amputation Site - T The wound measures 2.7cm length x 1.5cm width x 0.3cm depth; 3.181cm^2 area and 0.954cm^3 volume. There is Fat Layer (Subcutaneous  Tissue) oe. exposed. There is no tunneling or undermining noted. There is a medium amount of serosanguineous drainage noted. The wound margin is epibole. There is large (67-100%) red, pink granulation within the wound bed. There is a small (1-33%) amount of necrotic tissue within the wound bed including Adherent Slough. The periwound skin appearance did not exhibit: Callus, Crepitus, Excoriation, Induration, Rash, Scarring, Dry/Scaly, Maceration, Atrophie Blanche, Cyanosis, Ecchymosis, Hemosiderin Staining, Mottled, Pallor, Rubor, Erythema. Periwound temperature was noted as No Abnormality. Assessment Active Problems ICD-10 Non-pressure chronic ulcer of other part of right foot with fat layer exposed Type 2 diabetes mellitus with foot ulcer Atherosclerosis of native arteries of right leg with ulceration of other part of foot Peripheral vascular disease, unspecified Patient presents with a 49-month history of nonhealing wound to the right foot  from a previous third toe amputation site. This is due to peripheral arterial disease as well as uncontrolled type 2 diabetes. Unfortunately he has poor blood flow and there are no further revascularization options. He has been using Betadine. At this time I recommended cleaning the area with Vashe and trying Hydrofera Blue dressing. He knows he is at high risk for amputation. He ideally would benefit from a skin substitute and we will see if his insurance covers this. We will run for Kerecis and Grafix. [name], [name] ([id number]) [id number] Page 5 of 7 Plan Follow-up Appointments: Return Appointment in 1 week. - w/ Dr. Mikey Bussing and Veneda Melter # 9 Monday 07/11/22 @ 12:30 Anesthetic: (In clinic) Topical Lidocaine 5% applied to wound bed Bathing/ Shower/ Hygiene: May shower with protection but do not get wound dressing(s) wet. Protect dressing(s) with water repellant cover (for example, large plastic bag) or a cast cover and  may then take shower. Edema Control - Lymphedema / SCD / Other: Elevate legs to the level of the heart or above for 30 minutes daily and/or when sitting for 3-4 times a day throughout the day. Avoid standing for long periods of time. WOUND #3: - Amputation Site - T oe Wound Laterality: Right Cleanser: Vashe 5.8 (oz) 1 x Per Day/15 Days Discharge Instructions: Cleanse the wound with Vashe prior to applying a clean dressing using gauze sponges, not tissue or cotton balls. Prim Dressing: Hydrofera Blue Ready Transfer Foam, 4x5 (in/in) (DME) (Generic) 1 x Per Day/15 Days ary Discharge Instructions: Apply to wound bed as instructed Secondary Dressing: Woven Gauze Sponge, Non-Sterile 4x4 in (Generic) 1 x Per Day/15 Days Discharge Instructions: Apply over primary dressing as directed. Secured With: 59M Medipore H Soft Cloth Surgical T ape, 4 x 10 (in/yd) (DME) (Generic) 1 x Per Day/15 Days Discharge Instructions: Secure with tape as directed. 1. Hydrofera Blue 2. Vashe 3. Run IVR for Grafix and Kerecis 4. Follow-up in 1 week Electronic Signature(s) Signed: 07/04/2022 3:46:02 PM By: [name] DO Entered By: [name] on 07/04/2022 14:33:34 -------------------------------------------------------------------------------- HxROS Details Patient Name: Date of Service: [name] 07/04/2022 12:30 PM Medical Record Number: [id number] Patient Account Number: [id number] Date of Birth/Sex: Treating RN: 1934/01/28 (89 y.o. [name], Lauren Primary Care Provider: PA IGE, V ICTO RIA Other Clinician: Referring Provider: Treating Provider/Extender: [name] PA IGE, V ICTO RIA Weeks in Treatment: 0 Information Obtained From Patient Eyes Medical History: Positive for: Cataracts - removed both eyes Negative for: Glaucoma; Optic Neuritis Ear/Nose/Mouth/Throat Medical History: Negative for: Chronic sinus problems/congestion; Middle ear problems Past Medical History  Notes: hard of hearing Hematologic/Lymphatic Medical History: Positive for: Anemia Negative for: Hemophilia; Human Immunodeficiency Virus; Lymphedema; Sickle Cell Disease Respiratory Medical History: Positive for: Asthma; Chronic Obstructive Pulmonary Disease (COPD) Negative for: Aspiration; Pneumothorax; Sleep Apnea; Tuberculosis Cardiovascular Medical History: Positive for: Peripheral Arterial Disease [name], [name] ([id number]) [id number] Page 6 of 7 Endocrine Medical History: Positive for: Type II Diabetes - prediabetes Negative for: Type I Diabetes Time with diabetes: 3 yrs ago Treated with: Diet Blood sugar tested every day: No Genitourinary Medical History: Negative for: End Stage Renal Disease Integumentary (Skin) Medical History: Negative for: History of Burn Musculoskeletal Medical History: Positive for: Gout; Osteoarthritis Negative for: Rheumatoid Arthritis; Osteomyelitis Neurologic Medical History: Positive for: Neuropathy Oncologic Medical History: Negative for: Received Chemotherapy; Received Radiation Psychiatric Medical History: Negative for: Anorexia/bulimia; Confinement Anxiety HBO Extended History Items Eyes: Cataracts Immunizations Pneumococcal Vaccine: Received Pneumococcal Vaccination: Yes Received Pneumococcal Vaccination On or  After 60th Birthday: Yes Implantable Devices None Hospitalization / Surgery History Type of Hospitalization/Surgery cholecystectomy Family and Social History Cancer: Yes - Siblings; Diabetes: Yes - Siblings,Child; Heart Disease: Yes - Siblings; Hereditary Spherocytosis: No; Hypertension: Yes - Father,Siblings; Kidney Disease: No; Lung Disease: Yes - Siblings; Seizures: No; Stroke: Yes - Siblings; Thyroid Problems: No; Tuberculosis: No; Former smoker - quit 10 yrs ago; Marital Status - Married; Alcohol Use: Never; Drug Use: No History; Caffeine Use: Daily - coffee; Financial  Concerns: No; Food, Clothing or Shelter Needs: No; Support System Lacking: No; Transportation Concerns: No Electronic Signature(s) Signed: 07/04/2022 3:16:56 PM By: Fonnie Mu RN Signed: 07/04/2022 3:46:02 PM By: [name] DO Previous Signature: 07/04/2022 10:32:07 AM Version By: [name] DO Entered By: Fonnie Mu on 07/04/2022 12:46:36 -------------------------------------------------------------------------------- SuperBill Details Patient Name: Date of Service: [name] 07/04/2022 [name], [name] ([id number]) [id number] Page 7 of 7 Medical Record Number: [id number] Patient Account Number: [id number] Date of Birth/Sex: Treating RN: 12-Mar-1933 (89 y.o. [name] Primary Care Provider: PA IGE, V ICTO RIA Other Clinician: Referring Provider: Treating Provider/Extender: [name] PA IGE, V ICTO RIA Weeks in Treatment: 0 Diagnosis Coding ICD-10 Codes Code Description [phone] Non-pressure chronic ulcer of other part of right foot with fat layer exposed E11.621 Type 2 diabetes mellitus with foot ulcer I73.9 Peripheral vascular disease, unspecified T81.31XA Disruption of external operation (surgical) wound, not elsewhere classified, initial encounter Facility Procedures : CPT4 Code: [id number] Description: 99213 - WOUND CARE VISIT-LEV 3 EST PT Modifier: Quantity: 1 Physician Procedures : CPT4 Code Description Modifier [id number] - WC PHYS LEVEL 4 - NEW PT ICD-10 Diagnosis Description L97.512 Non-pressure chronic ulcer of other part of right foot with fat layer exposed E11.621 Type 2 diabetes mellitus with foot ulcer I73.9 Peripheral  vascular disease, unspecified T81.31XA Disruption of external operation (surgical) wound, not elsewhere classified, initial encounter Quantity: 1 Electronic Signature(s) Signed: 07/04/2022 3:46:02 PM By: [name] DO Entered By: [name] on 07/04/2022  14:33:41

## 2022-07-08 NOTE — Telephone Encounter (Signed)
Called [facility] to giving information to get consent to release patient from them to wound center, left message for call back with Triage nurse with whom they said that we needed to go thru-(patient has an upcoming appointment with them on 05/14).

## 2022-07-10 ENCOUNTER — Other Ambulatory Visit: Payer: Self-pay | Admitting: Family Medicine

## 2022-07-11 ENCOUNTER — Encounter (HOSPITAL_BASED_OUTPATIENT_CLINIC_OR_DEPARTMENT_OTHER): Payer: PPO | Attending: Internal Medicine | Admitting: Internal Medicine

## 2022-07-11 DIAGNOSIS — L97512 Non-pressure chronic ulcer of other part of right foot with fat layer exposed: Secondary | ICD-10-CM | POA: Insufficient documentation

## 2022-07-11 DIAGNOSIS — I70235 Atherosclerosis of native arteries of right leg with ulceration of other part of foot: Secondary | ICD-10-CM | POA: Insufficient documentation

## 2022-07-11 DIAGNOSIS — E11621 Type 2 diabetes mellitus with foot ulcer: Secondary | ICD-10-CM | POA: Insufficient documentation

## 2022-07-11 DIAGNOSIS — E1151 Type 2 diabetes mellitus with diabetic peripheral angiopathy without gangrene: Secondary | ICD-10-CM | POA: Insufficient documentation

## 2022-07-14 ENCOUNTER — Other Ambulatory Visit: Payer: Self-pay | Admitting: Family Medicine

## 2022-07-14 DIAGNOSIS — J449 Chronic obstructive pulmonary disease, unspecified: Secondary | ICD-10-CM

## 2022-07-18 ENCOUNTER — Other Ambulatory Visit: Payer: Self-pay | Admitting: Family Medicine

## 2022-07-18 ENCOUNTER — Encounter (HOSPITAL_BASED_OUTPATIENT_CLINIC_OR_DEPARTMENT_OTHER): Payer: PPO | Admitting: Internal Medicine

## 2022-07-18 DIAGNOSIS — L309 Dermatitis, unspecified: Secondary | ICD-10-CM

## 2022-07-18 DIAGNOSIS — L97512 Non-pressure chronic ulcer of other part of right foot with fat layer exposed: Secondary | ICD-10-CM | POA: Diagnosis not present

## 2022-07-18 DIAGNOSIS — I70235 Atherosclerosis of native arteries of right leg with ulceration of other part of foot: Secondary | ICD-10-CM

## 2022-07-18 DIAGNOSIS — E11621 Type 2 diabetes mellitus with foot ulcer: Secondary | ICD-10-CM

## 2022-07-19 ENCOUNTER — Ambulatory Visit: Payer: PPO | Admitting: Vascular Surgery

## 2022-07-21 ENCOUNTER — Encounter: Payer: Self-pay | Admitting: Family Medicine

## 2022-07-21 ENCOUNTER — Other Ambulatory Visit: Payer: Self-pay

## 2022-07-21 ENCOUNTER — Ambulatory Visit (INDEPENDENT_AMBULATORY_CARE_PROVIDER_SITE_OTHER): Payer: PPO | Admitting: Family Medicine

## 2022-07-21 VITALS — BP 125/85 | HR 89 | Ht 71.0 in | Wt 168.8 lb

## 2022-07-21 DIAGNOSIS — E1142 Type 2 diabetes mellitus with diabetic polyneuropathy: Secondary | ICD-10-CM

## 2022-07-21 DIAGNOSIS — M25561 Pain in right knee: Secondary | ICD-10-CM | POA: Diagnosis not present

## 2022-07-21 LAB — POCT GLYCOSYLATED HEMOGLOBIN (HGB A1C): HbA1c, POC (controlled diabetic range): 6.7 % (ref 0.0–7.0)

## 2022-07-21 NOTE — Patient Instructions (Signed)
It was great seeing you today!  We will get an x ray of your right knee and you can walk to the hospital to get that done. I will call you once I get the results which will likely be on Monday  I recommend calling Urology about your abnormal CT scan because they will likely want to do additional imaging such as an MRI.  Continue your Lantus 6 units daily  Reduce Gabapentin to 300mg  twice a day   Please check-out at the front desk before leaving the clinic. Return in 3 months for your next diabetes check up, but if you need to be seen earlier than that for any new issues we're happy to fit you in, just give [place] a call!  Feel free to call with any questions or concerns at any time, at [phone].   Take care,  Dr. Cora Collum [facility]

## 2022-07-21 NOTE — Progress Notes (Signed)
    SUBJECTIVE:   CHIEF COMPLAINT / HPI:   Patient presents with his daughter for diabetes follow up.   Current medication includes Lantus 6 units daily and Jardiance 25mg  Brought in sugar log with good fasting ranges averaging in the low 100s   Takes Gabapentin 300 in the morning and 600 at night for pain. Not quite sure if its helpful   States his right knee hurts which has been worsening for the last few months. Denies any injury. Hurts when going up and down stairs. Has not tried anything in particular for the pain   Asks about results from CT abdomen in March ordered by Urology   PERTINENT  PMH / PSH: Reviewed   OBJECTIVE:   BP 125/85   Pulse 89   Ht 5\' 11"  (1.803 m)   Wt 168 lb 12.8 oz (76.6 kg)   SpO2 94%   BMI 23.54 kg/m    Physical exam General: well appearing, NAD Cardiovascular: RRR, no murmurs Lungs: CTAB. Normal WOB Abdomen: soft, non-distended, non-tender Skin: warm, dry. No edema MSK R Knee: - Inspection: Mild indentation of patella and mild edema. No erythema or bruising - Palpation: TTP over patella  - ROM: full active ROM with flexion and extension in knee. Pain with valgus/varus force  - Strength: 5/5 strength  - Neuro/vasc: NV intact distally  - Special Tests: - LIGAMENTS: negative anterior and posterior drawer, negative Lachman's, no MCL or LCL laxity   ASSESSMENT/PLAN:   Type 2 diabetes mellitus with peripheral neuropathy (HCC) A1C 6.7 down from 9.1 two and a half months ago. Currently on Lantus 6 units daily and Jardiance 25mg . CBG log showing good fasting blood sugars. Will continue current regimen. Advised him to let us know if he has any lows. Also recommended decreasing Gabapentin given his kidney function. Will take 300 BID not to exceed 600mg  daily. Return in 3 months for next A1c check   R knee pain Patient with several months of progressive R knee pain without inciting injury. Ambulates on his own and without any falls. Physical exam  significant for possibly deformity over patella appearing indented, tenderness over patella and pain with valgus/varus forces. Will obtain X ray. Pain management with extra strength Tylenol.  Discussed recent CT results ordered by Urology and advised him to give them a call to discuss as they may want to order an MRI for further evaluation  Cora Collum, DO [facility]

## 2022-07-22 ENCOUNTER — Ambulatory Visit
Admission: RE | Admit: 2022-07-22 | Discharge: 2022-07-22 | Disposition: A | Discharge status: Home / Self Care | Payer: PPO | Source: Ambulatory Visit | Attending: Family Medicine | Admitting: Family Medicine

## 2022-07-22 DIAGNOSIS — M25561 Pain in right knee: Secondary | ICD-10-CM

## 2022-07-23 ENCOUNTER — Other Ambulatory Visit: Payer: Self-pay | Admitting: Family Medicine

## 2022-07-23 DIAGNOSIS — M25561 Pain in right knee: Secondary | ICD-10-CM | POA: Insufficient documentation

## 2022-07-23 NOTE — Assessment & Plan Note (Signed)
Patient with several months of progressive R knee pain without inciting injury. Ambulates on his own and without any falls. Physical exam significant for possibly deformity over patella appearing indented, tenderness over patella and pain with valgus/varus forces. Will obtain X ray. Pain management with extra strength Tylenol.

## 2022-07-23 NOTE — Assessment & Plan Note (Signed)
A1C 6.7 down from 9.1 two and a half months ago. Currently on Lantus 6 units daily and Jardiance 25mg . CBG log showing good fasting blood sugars. Will continue current regimen. Advised him to let us know if he has any lows. Also recommended decreasing Gabapentin given his kidney function. Will take 300 BID not to exceed 600mg  daily. Return in 3 months for next A1c check

## 2022-07-25 ENCOUNTER — Encounter (HOSPITAL_BASED_OUTPATIENT_CLINIC_OR_DEPARTMENT_OTHER): Payer: PPO | Admitting: Internal Medicine

## 2022-07-25 ENCOUNTER — Other Ambulatory Visit: Payer: Self-pay

## 2022-07-25 ENCOUNTER — Telehealth: Payer: Self-pay

## 2022-07-25 DIAGNOSIS — L97512 Non-pressure chronic ulcer of other part of right foot with fat layer exposed: Secondary | ICD-10-CM

## 2022-07-25 DIAGNOSIS — E11621 Type 2 diabetes mellitus with foot ulcer: Secondary | ICD-10-CM | POA: Diagnosis not present

## 2022-07-25 DIAGNOSIS — I70235 Atherosclerosis of native arteries of right leg with ulceration of other part of foot: Secondary | ICD-10-CM

## 2022-07-25 NOTE — Telephone Encounter (Signed)
Patients daughter calls nurse line requesting xray results.   I advised the results are not back yet.   She asks we call him to discuss once they back.   Will forward to PCP.

## 2022-07-26 MED ORDER — APIXABAN 2.5 MG PO TABS: 2.5000 mg | ORAL_TABLET | Freq: Two times a day (BID) | ORAL | 3 refills | Status: DC

## 2022-07-27 ENCOUNTER — Telehealth: Payer: Self-pay | Admitting: Family Medicine

## 2022-07-27 DIAGNOSIS — M25561 Pain in right knee: Secondary | ICD-10-CM

## 2022-07-27 NOTE — Telephone Encounter (Signed)
Called patient to discuss x ray results. No fracture but does show degenerative changes/ worsening of his arthritis and possibly inflammation. Will refer to sports medicine clinic for further evaluation and treatment.   Also discussed that I reduced his Eliquis from 5mg  to 2.5mg  BID given his age and kidney function.

## 2022-08-02 ENCOUNTER — Ambulatory Visit (INDEPENDENT_AMBULATORY_CARE_PROVIDER_SITE_OTHER): Payer: PPO | Admitting: Sports Medicine

## 2022-08-02 VITALS — BP 138/72 | Ht 71.0 in | Wt 166.0 lb

## 2022-08-02 DIAGNOSIS — M1711 Unilateral primary osteoarthritis, right knee: Secondary | ICD-10-CM | POA: Diagnosis not present

## 2022-08-03 NOTE — Progress Notes (Signed)
   Subjective:    Patient ID: [name], male    DOB: [birth year], 89 y.o.   MRN: [id number]  HPI chief complaint: Right knee pain  Patient is a very pleasant 89 year old male that presents today at the request of his PCP for evaluation of right knee pain.  Patient states that he has had intermittent knee pain for many years.  It would usually resolve on its own or with the use of topical Voltaren.  He has required cortisone injections in the past as well.  When he most recently presented to his PCP with a complaint of returning knee pain, an x-ray was ordered and he was referred to our office for further management.  Today, he states that his pain is improving.  He has once again been using his topical Voltaren.  He denies any recent injury to the knee.  No prior knee surgeries.  Past medical history reviewed Medications reviewed Allergies reviewed    Review of Systems As above    Objective:   Physical Exam  Well-developed, well-nourished.  No acute distress  Right knee: He does have some moderate quad and hip weakness.  Full range of motion without any obvious effusion.  Slight tenderness along medial and lateral joint lines but a negative McMurray's.  He has 2+ patellofemoral crepitus as well as some tenderness diffusely along the anterior knee in the area of Hoffa's fat pad.  No soft tissue swelling.  Knee is grossly stable ligamentous exam.  Neurovascularly intact distally.  X-rays of his right knee are reviewed.  He does have some mild tricompartmental degenerative changes but still has appreciable joint space.  There may be some soft tissue swelling in the infrapatellar fat pad but this is inconclusive.      Assessment & Plan:   Right knee pain likely secondary to DJD versus anterior knee impingement  Patient symptoms are improving.  I recommended a simple watchful waiting approach at this time.  He will continue with his topical Voltaren.  If symptoms once again worsen,  he will return to my office for consideration of a cortisone injection.  We will also give him home exercises (quad sets and isometric quad strengthening exercises) to help combat the progressive weakness he is getting in his quadriceps muscles.  I also discussed the possibility of formal physical therapy but he would like to wait on that for now.  Follow-up as needed.  This note was dictated using Dragon naturally speaking software and may contain errors in syntax, spelling, or content which have not been identified prior to signing this note.

## 2022-08-04 ENCOUNTER — Encounter (HOSPITAL_BASED_OUTPATIENT_CLINIC_OR_DEPARTMENT_OTHER): Payer: PPO | Admitting: Internal Medicine

## 2022-08-04 DIAGNOSIS — E11621 Type 2 diabetes mellitus with foot ulcer: Secondary | ICD-10-CM

## 2022-08-04 DIAGNOSIS — I70235 Atherosclerosis of native arteries of right leg with ulceration of other part of foot: Secondary | ICD-10-CM | POA: Diagnosis not present

## 2022-08-04 DIAGNOSIS — L97512 Non-pressure chronic ulcer of other part of right foot with fat layer exposed: Secondary | ICD-10-CM | POA: Diagnosis not present

## 2022-08-05 NOTE — Progress Notes (Signed)
HARDACRE, [name] ([id number]) [id number] Page 1 of 7 Visit Report for 08/04/2022 Chief Complaint Document Details Patient Name: Date of Service: [name] 08/04/2022 1:15 PM Medical Record Number: [id number] Patient Account Number: [id number] Date of Birth/Sex: Treating RN: 04-24-1933 (89 y.o. Male) Primary Care Provider: Franchot Erichsen Other Clinician: Referring Provider: Treating Provider/Extender: Newman Pies Weeks in Treatment: 4 Information Obtained from: Patient Chief Complaint 07/04/2022; right foot wound Electronic Signature(s) Signed: 08/04/2022 4:34:47 PM By: [name] DO Entered By: [name] on 08/04/2022 14:02:30 -------------------------------------------------------------------------------- HPI Details Patient Name: Date of Service: [name] 08/04/2022 1:15 PM Medical Record Number: [id number] Patient Account Number: [id number] Date of Birth/Sex: Treating RN: 1933-07-31 (89 y.o. Male) Primary Care Provider: Franchot Erichsen Other Clinician: Referring Provider: Treating Provider/Extender: Newman Pies Weeks in Treatment: 4 History of Present Illness HPI Description: 07/04/2022 [name] is an 89 year old male with a past medical history of uncontrolled, insulin dependent type 2 diabetes, CKD stage III, and COPD that presents to the clinic for a right foot wound. He has an extensive history of peripheral arterial disease to the right leg. He has had a right superficial femoral to posterior tibial artery bypass by Dr. Elonda Husky in 2020. He had a right common femoral to plantar posterior tibial artery bypass on 02/03/2022 by Dr. Lenell Antu and at that time he also had a right toe amputation. He states this wound site had healed completely. Prior to this he had amputations to the first and second digit to the right foot. He presents today with an open wound to  the previous right toe amputation site for the past 2 months. He has been following with vein and vascular for this issue. Most recent ABIs were on 06/22/2022 that showed absent waveforms in the right lower extremity. Currently his right lower extremity bypass is occluded and no plan for another anteriorgram and has he likely has no revascularization options. Currently he has been using Betadine to the wound bed. He denies signs of infection. 07/11/2022 patient arrives in clinic today using Hydrofera Blue on the toe amputation site. The patient is revascularized and is not felt to have any other options for revascularization. We are using Hydrofera Blue and gauze. 5/13; patient presents for follow-up. He has been using Hydrofera Blue to the toe amputation site. He has had no issues. Wound is smaller. He has been approved for [facility] and was agreeable to have this placed in office today. 5/20; patient presents for follow-up. We have been using Kerecis to the wound bed. There is been improvement in healing. Unfortunately patient did not change the outer dressing for a week. There is slight maceration to the periwound. Unclear why he did not do this. 5/30; patient presents for follow-up. We have been using Kerecis to the wound bed. More maceration to the periwound today. No signs of infection. Electronic Signature(s) Signed: 08/04/2022 4:34:47 PM By: [name] DO Entered By: [name] on 08/04/2022 14:03:04 -------------------------------------------------------------------------------- Physical Exam Details Patient Name: Date of Service: [name] 08/04/2022 1:15 PM Medical Record Number: [id number] Patient Account Number: [id number] Date of Birth/Sex: Treating RN: 07-20-1933 (89 y.o. Male) Primary Care Provider: Franchot Erichsen Other Clinician: Referring Provider: Treating Provider/Extender: Candace Gallus in Treatment: 4 [name], [name]  ([id number]) [id number] Page 2 of 7 Constitutional respirations regular, non-labored and within target range for patient.. Cardiovascular 2+ dorsalis pedis/posterior tibialis pulses. Psychiatric pleasant and cooperative. Notes Third toe amputation site with  granulation tissue throughout. Maceration to the periwound. No signs of surrounding infection. Electronic Signature(s) Signed: 08/04/2022 4:34:47 PM By: [name] DO Entered By: [name] on 08/04/2022 14:03:35 -------------------------------------------------------------------------------- Physician Orders Details Patient Name: Date of Service: [name] 08/04/2022 1:15 PM Medical Record Number: [id number] Patient Account Number: [id number] Date of Birth/Sex: Treating RN: [birth year] (89 y.o. Male) Karie Schwalbe Primary Care Provider: Franchot Erichsen Other Clinician: Referring Provider: Treating Provider/Extender: Newman Pies Weeks in Treatment: 4 Verbal / Phone Orders: No Diagnosis Coding Follow-up Appointments ppointment in 2 weeks. - Dr Mikey Bussing Room 9 Return A Monday 08/15/22 at 2pm Anesthetic (In clinic) Topical Lidocaine 5% applied to wound bed Cellular or Tissue Based Products daptic or Mepitel. (DO NOT REMOVE). - Cellular or Tissue Based Product applied to wound bed, secured with steri-strips, cover with A Kerecis #1 (on hold 08/04/22) Bathing/ Shower/ Hygiene May shower with protection but do not get wound dressing(s) wet. Protect dressing(s) with water repellant cover (for example, large plastic bag) or a cast cover and may then take shower. Edema Control - Lymphedema / SCD / Other Elevate legs to the level of the heart or above for 30 minutes daily and/or when sitting for 3-4 times a day throughout the day. Avoid standing for long periods of time. Wound Treatment Wound #3 - Amputation Site - Toe Wound Laterality: Right Cleanser: Vashe 5.8  (oz) 1 x Per Day/30 Days Discharge Instructions: Cleanse the wound with Vashe prior to applying a clean dressing using gauze sponges, not tissue or cotton balls. Prim Dressing: Hydrofera Blue Ready Transfer Foam, 2.5x2.5 (in/in) 1 x Per Day/30 Days ary Discharge Instructions: Apply directly to wound bed as directed Secondary Dressing: Woven Gauze Sponge, Non-Sterile 4x4 in (Generic) 1 x Per Day/30 Days Discharge Instructions: Apply over primary dressing as directed. Secured With: Paper Tape, 2x10 (in/yd) 1 x Per Day/30 Days Discharge Instructions: Secure dressing with tape as directed. Electronic Signature(s) Signed: 08/04/2022 4:34:47 PM By: [name] DO Entered By: [name] on 08/04/2022 14:03:42 Problem List Details -------------------------------------------------------------------------------- [name] ([id number]) [id number] Page 3 of 7 Patient Name: Date of Service: [name] 08/04/2022 1:15 PM Medical Record Number: [id number] Patient Account Number: [id number] Date of Birth/Sex: Treating RN: May 16, 1933 (89 y.o. Male) Primary Care Provider: Franchot Erichsen Other Clinician: Referring Provider: Treating Provider/Extender: Newman Pies Weeks in Treatment: 4 Active Problems ICD-10 Encounter Code Description Active Date MDM Diagnosis L97.512 Non-pressure chronic ulcer of other part of right foot with fat layer exposed 07/04/2022 No Yes E11.621 Type 2 diabetes mellitus with foot ulcer 07/04/2022 No Yes I70.235 Atherosclerosis of native arteries of right leg with ulceration of other part of 07/04/2022 No Yes foot I73.9 Peripheral vascular disease, unspecified 07/04/2022 No Yes Inactive Problems Resolved Problems Electronic Signature(s) Signed: 08/04/2022 4:34:47 PM By: [name] DO Entered By: [name] on 08/04/2022  14:02:19 -------------------------------------------------------------------------------- Progress Note Details Patient Name: Date of Service: [name] 08/04/2022 1:15 PM Medical Record Number: [id number] Patient Account Number: [id number] Date of Birth/Sex: Treating RN: 30-Dec-1933 (89 y.o. Male) Primary Care Provider: Franchot Erichsen Other Clinician: Referring Provider: Treating Provider/Extender: Lorette Ang, Benetta Spar Weeks in Treatment: 4 Subjective Chief Complaint Information obtained from Patient 07/04/2022; right foot wound History of Present Illness (HPI) 07/04/2022 Mr. Vidhur Maddex is an 89 year old male with a past medical history of uncontrolled, insulin dependent type 2 diabetes, CKD stage III, and COPD that presents to the clinic for a right foot wound.  He has an extensive history of peripheral arterial disease to the right leg. He has had a right superficial femoral to posterior tibial artery bypass by Dr. Elonda Husky in 2020. He had a right common femoral to plantar posterior tibial artery bypass on 02/03/2022 by Dr. Lenell Antu and at that time he also had a right toe amputation. He states this wound site had healed completely. Prior to this he had amputations to the first and second digit to the right foot. He presents today with an open wound to the previous right toe amputation site for the past 2 months. He has been following with vein and vascular for this issue. Most recent ABIs were on 06/22/2022 that showed absent waveforms in the right lower extremity. Currently his right lower extremity bypass is occluded and no plan for another anteriorgram and has he likely has no revascularization options. Currently he has been using Betadine to the wound bed. He denies signs of infection. 07/11/2022 patient arrives in clinic today using Hydrofera Blue on the toe amputation site. The patient is revascularized and is not felt to have any other options for  revascularization. We are using Hydrofera Blue and gauze. 5/13; patient presents for follow-up. He has been using Hydrofera Blue to the toe amputation site. He has had no issues. Wound is smaller. He has been approved for [facility] and was agreeable to have this placed in office today. 5/20; patient presents for follow-up. We have been using Kerecis to the wound bed. There is been improvement in healing. Unfortunately patient did not change the outer dressing for a week. There is slight maceration to the periwound. Unclear why he did not do this. 5/30; patient presents for follow-up. We have been using Kerecis to the wound bed. More maceration to the periwound today. No signs of infection. [name], [name] ([id number]) [id number] Page 4 of 7 Patient History Information obtained from Patient. Family History Cancer - Siblings, Diabetes - Siblings,Child, Heart Disease - Siblings, Hypertension - Father,Siblings, Lung Disease - Siblings, Stroke - Siblings, No family history of Hereditary Spherocytosis, Kidney Disease, Seizures, Thyroid Problems, Tuberculosis. Social History Former smoker - quit 10 yrs ago, Marital Status - Married, Alcohol Use - Never, Drug Use - No History, Caffeine Use - Daily - coffee. Medical History Eyes Patient has history of Cataracts - removed both eyes Denies history of Glaucoma, Optic Neuritis Ear/Nose/Mouth/Throat Denies history of Chronic sinus problems/congestion, Middle ear problems Hematologic/Lymphatic Patient has history of Anemia Denies history of Hemophilia, Human Immunodeficiency Virus, Lymphedema, Sickle Cell Disease Respiratory Patient has history of Asthma, Chronic Obstructive Pulmonary Disease (COPD) Denies history of Aspiration, Pneumothorax, Sleep Apnea, Tuberculosis Cardiovascular Patient has history of Peripheral Arterial Disease Endocrine Patient has history of Type II Diabetes - prediabetes Denies history of Type I  Diabetes Genitourinary Denies history of End Stage Renal Disease Integumentary (Skin) Denies history of History of Burn Musculoskeletal Patient has history of Gout, Osteoarthritis Denies history of Rheumatoid Arthritis, Osteomyelitis Neurologic Patient has history of Neuropathy Oncologic Denies history of Received Chemotherapy, Received Radiation Psychiatric Denies history of Anorexia/bulimia, Confinement Anxiety Hospitalization/Surgery History - cholecystectomy. Medical A Surgical History Notes nd Ear/Nose/Mouth/Throat hard of hearing Objective Constitutional respirations regular, non-labored and within target range for patient.. Vitals Time Taken: 1:12 PM, Pulse: 76 bpm, Respiratory Rate: 18 breaths/min, Blood Pressure: 136/66 mmHg, Capillary Blood Glucose: 117 mg/dl. Cardiovascular 2+ dorsalis pedis/posterior tibialis pulses. Psychiatric pleasant and cooperative. General Notes: Third toe amputation site with granulation tissue throughout. Maceration to the periwound. No signs of surrounding infection.  Integumentary (Hair, Skin) Wound #3 status is Open. Original cause of wound was Gradually Appeared. The date acquired was: 04/07/2022. The wound has been in treatment 4 weeks. The wound is located on the Right Amputation Site - T The wound measures 1.7cm length x 0.6cm width x 0.3cm depth; 0.801cm^2 area and 0.24cm^3 volume. oe. There is Fat Layer (Subcutaneous Tissue) exposed. There is no tunneling or undermining noted. There is a medium amount of serosanguineous drainage noted. The wound margin is epibole. There is large (67-100%) red, pink granulation within the wound bed. There is no necrotic tissue within the wound bed. The periwound skin appearance exhibited: Maceration. The periwound skin appearance did not exhibit: Callus, Crepitus, Excoriation, Induration, Rash, Scarring, Dry/Scaly, Atrophie Blanche, Cyanosis, Ecchymosis, Hemosiderin Staining, Mottled, Pallor, Rubor,  Erythema. Periwound temperature was noted as No Abnormality. Assessment [name], [name] ([id number]) [id number] Page 5 of 7 Active Problems ICD-10 Non-pressure chronic ulcer of other part of right foot with fat layer exposed Type 2 diabetes mellitus with foot ulcer Atherosclerosis of native arteries of right leg with ulceration of other part of foot Peripheral vascular disease, unspecified Patient's wound is stable. There is more maceration to the periwound. I recommended holding the skin substitute for now. He can go back to [employer]	[place]. Continue offloading the area. Follow-up in 1-2 week. Plan Follow-up Appointments: Return Appointment in 2 weeks. - Dr Mikey Bussing Room 9 Monday 08/15/22 at 2pm Anesthetic: (In clinic) Topical Lidocaine 5% applied to wound bed Cellular or Tissue Based Products: Cellular or Tissue Based Product applied to wound bed, secured with steri-strips, cover with Adaptic or Mepitel. (DO NOT REMOVE). - Kerecis #1 (on hold 08/04/22) Bathing/ Shower/ Hygiene: May shower with protection but do not get wound dressing(s) wet. Protect dressing(s) with water repellant cover (for example, large plastic bag) or a cast cover and may then take shower. Edema Control - Lymphedema / SCD / Other: Elevate legs to the level of the heart or above for 30 minutes daily and/or when sitting for 3-4 times a day throughout the day. Avoid standing for long periods of time. WOUND #3: - Amputation Site - T oe Wound Laterality: Right Cleanser: Vashe 5.8 (oz) 1 x Per Day/30 Days Discharge Instructions: Cleanse the wound with Vashe prior to applying a clean dressing using gauze sponges, not tissue or cotton balls. Prim Dressing: Hydrofera Blue Ready Transfer Foam, 2.5x2.5 (in/in) 1 x Per Day/30 Days ary Discharge Instructions: Apply directly to wound bed as directed Secondary Dressing: Woven Gauze Sponge, Non-Sterile 4x4 in (Generic) 1 x Per Day/30  Days Discharge Instructions: Apply over primary dressing as directed. Secured With: Paper T ape, 2x10 (in/yd) 1 x Per Day/30 Days Discharge Instructions: Secure dressing with tape as directed. 1. Hydrofera Blue 2. Follow-up in 1-2 weeks Electronic Signature(s) Signed: 08/04/2022 4:34:47 PM By: [name] DO Entered By: [name] on 08/04/2022 14:04:43 -------------------------------------------------------------------------------- HxROS Details Patient Name: Date of Service: [name] 08/04/2022 1:15 PM Medical Record Number: [id number] Patient Account Number: [id number] Date of Birth/Sex: Treating RN: [birth year] (89 y.o. Male) Primary Care Provider: Franchot Erichsen Other Clinician: Referring Provider: Treating Provider/Extender: Newman Pies Weeks in Treatment: 4 Information Obtained From Patient Eyes Medical History: Positive for: Cataracts - removed both eyes Negative for: Glaucoma; Optic Neuritis Ear/Nose/Mouth/Throat Medical History: Negative for: Chronic sinus problems/congestion; Middle ear problems Past Medical History Notes: hard of hearing Hematologic/Lymphatic Medical HistoryHAMADI, [name] ([id number]) [id number] Page 6 of 7 Positive for: Anemia Negative for: Hemophilia; Human Immunodeficiency Virus;  Lymphedema; Sickle Cell Disease Respiratory Medical History: Positive for: Asthma; Chronic Obstructive Pulmonary Disease (COPD) Negative for: Aspiration; Pneumothorax; Sleep Apnea; Tuberculosis Cardiovascular Medical History: Positive for: Peripheral Arterial Disease Endocrine Medical History: Positive for: Type II Diabetes - prediabetes Negative for: Type I Diabetes Time with diabetes: 3 yrs ago Treated with: Diet Blood sugar tested every day: No Genitourinary Medical History: Negative for: End Stage Renal Disease Integumentary (Skin) Medical History: Negative for: History of  Burn Musculoskeletal Medical History: Positive for: Gout; Osteoarthritis Negative for: Rheumatoid Arthritis; Osteomyelitis Neurologic Medical History: Positive for: Neuropathy Oncologic Medical History: Negative for: Received Chemotherapy; Received Radiation Psychiatric Medical History: Negative for: Anorexia/bulimia; Confinement Anxiety HBO Extended History Items Eyes: Cataracts Immunizations Pneumococcal Vaccine: Received Pneumococcal Vaccination: Yes Received Pneumococcal Vaccination On or After 60th Birthday: Yes Implantable Devices None Hospitalization / Surgery History Type of Hospitalization/Surgery cholecystectomy Family and Social History Cancer: Yes - Siblings; Diabetes: Yes - Siblings,Child; Heart Disease: Yes - Siblings; Hereditary Spherocytosis: No; Hypertension: Yes - Father,Siblings; Kidney Disease: No; Lung Disease: Yes - Siblings; Seizures: No; Stroke: Yes - Siblings; Thyroid Problems: No; Tuberculosis: No; Former smoker - quit 10 yrs ago; Marital Status - Married; Alcohol Use: Never; Drug Use: No History; Caffeine Use: Daily - coffee; Financial Concerns: No; Food, Clothing or Shelter Needs: No; Support System Lacking: No; Transportation Concerns: No [name], [name] ([id number]) [id number] Page 7 of 7 Electronic Signature(s) Signed: 08/04/2022 4:34:47 PM By: [name] DO Entered By: [name] on 08/04/2022 14:03:09 -------------------------------------------------------------------------------- SuperBill Details Patient Name: Date of Service: [name] 08/04/2022 Medical Record Number: [id number] Patient Account Number: [id number] Date of Birth/Sex: Treating RN: 1933-04-24 (89 y.o. Male) Karie Schwalbe Primary Care Provider: Franchot Erichsen Other Clinician: Referring Provider: Treating Provider/Extender: Newman Pies Weeks in Treatment: 4 Diagnosis Coding ICD-10 Codes Code  Description [phone] Non-pressure chronic ulcer of other part of right foot with fat layer exposed E11.621 Type 2 diabetes mellitus with foot ulcer I70.235 Atherosclerosis of native arteries of right leg with ulceration of other part of foot I73.9 Peripheral vascular disease, unspecified Facility Procedures : CPT4 Code: [id number] Description: 99213 - WOUND CARE VISIT-LEV 3 EST PT Modifier: Quantity: 1 Physician Procedures : CPT4 Code Description Modifier [id number] - WC PHYS LEVEL 3 - EST PT ICD-10 Diagnosis Description L97.512 Non-pressure chronic ulcer of other part of right foot with fat layer exposed E11.621 Type 2 diabetes mellitus with foot ulcer I70.235  Atherosclerosis of native arteries of right leg with ulceration of other part of foot I73.9 Peripheral vascular disease, unspecified Quantity: 1 Electronic Signature(s) Signed: 08/04/2022 4:34:47 PM By: [name] DO Previous Signature: 08/04/2022 2:06:23 PM Version By: Karie Schwalbe RN Entered By: [name] on 08/04/2022 14:05:24

## 2022-08-15 ENCOUNTER — Encounter (HOSPITAL_BASED_OUTPATIENT_CLINIC_OR_DEPARTMENT_OTHER): Payer: PPO | Attending: Internal Medicine | Admitting: Internal Medicine

## 2022-08-15 DIAGNOSIS — L97512 Non-pressure chronic ulcer of other part of right foot with fat layer exposed: Secondary | ICD-10-CM | POA: Insufficient documentation

## 2022-08-15 DIAGNOSIS — I70235 Atherosclerosis of native arteries of right leg with ulceration of other part of foot: Secondary | ICD-10-CM | POA: Diagnosis not present

## 2022-08-15 DIAGNOSIS — E1151 Type 2 diabetes mellitus with diabetic peripheral angiopathy without gangrene: Secondary | ICD-10-CM | POA: Diagnosis not present

## 2022-08-15 DIAGNOSIS — N183 Chronic kidney disease, stage 3 unspecified: Secondary | ICD-10-CM | POA: Insufficient documentation

## 2022-08-15 DIAGNOSIS — J4489 Other specified chronic obstructive pulmonary disease: Secondary | ICD-10-CM | POA: Insufficient documentation

## 2022-08-15 DIAGNOSIS — E1122 Type 2 diabetes mellitus with diabetic chronic kidney disease: Secondary | ICD-10-CM | POA: Diagnosis not present

## 2022-08-15 DIAGNOSIS — Z794 Long term (current) use of insulin: Secondary | ICD-10-CM | POA: Diagnosis not present

## 2022-08-15 DIAGNOSIS — E11621 Type 2 diabetes mellitus with foot ulcer: Secondary | ICD-10-CM | POA: Insufficient documentation

## 2022-08-16 NOTE — Progress Notes (Signed)
[name], [name] ([id number]) [id number] Page 1 of 8 Visit Report for 07/11/2022 Arrival Information Details Patient Name: Date of Service: [name] 07/11/2022 12:30 PM Medical Record Number: [id number] Patient Account Number: [id number] Date of Birth/Sex: Treating RN: [birth year] (89 y.o. [name] Primary Care Sonyia Muro: Franchot Erichsen Other Clinician: Referring Omir Cooprider: Treating Euva Rundell/Extender: Scherrie Merritts in Treatment: 1 Visit Information History Since Last Visit All ordered tests and consults were completed: Yes Patient Arrived: Ambulatory Added or deleted any medications: No Arrival Time: 12:28 Any new allergies or adverse reactions: No Accompanied By: spouse Had a fall or experienced change in No Transfer Assistance: None activities of daily living that may affect Patient Requires Transmission-Based Precautions: No risk of falls: Patient Has Alerts: Yes Signs or symptoms of abuse/neglect since last visito No Patient Alerts: Patient on Blood Thinner Hospitalized since last visit: No Implantable device outside of the clinic excluding No cellular tissue based products placed in the center since last visit: Has Dressing in Place as Prescribed: Yes Pain Present Now: No Electronic Signature(s) Signed: 08/16/2022 7:49:05 AM By: Brenton Grills Entered By: Brenton Grills on 07/11/2022 12:31:20 -------------------------------------------------------------------------------- Clinic Level of Care Assessment Details Patient Name: Date of Service: [name] 07/11/2022 12:30 PM Medical Record Number: [id number] Patient Account Number: [id number] Date of Birth/Sex: Treating RN: 1933/06/19 (89 y.o. [name] Primary Care Mckinlee Dunk: Franchot Erichsen Other Clinician: Referring Rokia Bosket: Treating Kendale Rembold/Extender: Scherrie Merritts in Treatment: 1 Clinic Level of Care  Assessment Items TOOL 1 Quantity Score X- 1 0 Use when EandM and Procedure is performed on INITIAL visit ASSESSMENTS - Nursing Assessment / Reassessment X- 1 20 General Physical Exam (combine w/ comprehensive assessment (listed just below) when performed on new pt. evals) X- 1 25 Comprehensive Assessment (HX, ROS, Risk Assessments, Wounds Hx, etc.) ASSESSMENTS - Wound and Skin Assessment / Reassessment X- 1 10 Dermatologic / Skin Assessment (not related to wound area) ASSESSMENTS - Ostomy and/or Continence Assessment and Care []  - 0 Incontinence Assessment and Management []  - 0 Ostomy Care Assessment and Management (repouching, etc.) PROCESS - Coordination of Care X - Simple Patient / Family Education for ongoing care 1 15 []  - 0 Complex (extensive) Patient / Family Education for ongoing care X- 1 10 Staff obtains Consents, Records, T Results / Process Orders est []  - 0 Staff telephones HHA, Nursing Homes / Clarify orders / etc []  - 0 Routine Transfer to another Facility (non-emergent condition) []  - 0 Routine Hospital Admission (non-emergent condition) [name], [name] ([id number]) [id number] Page 2 of 8 []  - 0 New Admissions / Manufacturing engineer / Ordering NPWT Apligraf, etc. , []  - 0 Emergency Hospital Admission (emergent condition) PROCESS - Special Needs []  - 0 Pediatric / Minor Patient Management []  - 0 Isolation Patient Management []  - 0 Hearing / Language / Visual special needs []  - 0 Assessment of Community assistance (transportation, D/C planning, etc.) []  - 0 Additional assistance / Altered mentation []  - 0 Support Surface(s) Assessment (bed, cushion, seat, etc.) INTERVENTIONS - Miscellaneous []  - 0 External ear exam []  - 0 Patient Transfer (multiple staff / Nurse, adult / Similar devices) []  - 0 Simple Staple / Suture removal (25 or less) []  - 0 Complex Staple / Suture removal (26 or more) []  -  0 Hypo/Hyperglycemic Management (do not check if billed separately) []  - 0 Ankle / Brachial Index (ABI) - do not check if billed separately Has the patient been seen at the  hospital within the last three years: Yes Total Score: 80 Level Of Care: New/Established - Level 3 Electronic Signature(s) Signed: 08/16/2022 7:49:05 AM By: Brenton Grills Entered By: Brenton Grills on 07/11/2022 13:05:44 -------------------------------------------------------------------------------- Encounter Discharge Information Details Patient Name: Date of Service: [name] 07/11/2022 12:30 PM Medical Record Number: [id number] Patient Account Number: [id number] Date of Birth/Sex: Treating RN: Feb 20, 1934 (89 y.o. [name] Primary Care Akeen Ledyard: Franchot Erichsen Other Clinician: Referring Leandria Thier: Treating Ryleigh Buenger/Extender: Scherrie Merritts in Treatment: 1 Encounter Discharge Information Items Discharge Condition: Stable Ambulatory Status: Ambulatory Discharge Destination: Home Transportation: Private Auto Accompanied By: spouse Schedule Follow-up Appointment: Yes Clinical Summary of Care: Patient Declined Electronic Signature(s) Signed: 08/16/2022 7:49:05 AM By: Brenton Grills Entered By: Brenton Grills on 07/11/2022 13:06:13 -------------------------------------------------------------------------------- Lower Extremity Assessment Details Patient Name: Date of Service: [name] 07/11/2022 12:30 PM Medical Record Number: [id number] Patient Account Number: [id number] Date of Birth/Sex: Treating RN: 06-14-1933 (89 y.o. [name] Primary Care Zymire Turnbo: Franchot Erichsen Other Clinician: Referring Kieley Akter: Treating Ruchy Wildrick/Extender: Hunt Oris Weeks in Treatment: 1 Edema Assessment R[Left: [name] ([id number])] [name]: [id number] Page 3 of 8] Assessed: [Left: No] [Right: No] Edema:  [Left: N] [Right: o] Calf Left: Right: Point of Measurement: From Medial Instep 29 cm Ankle Left: Right: Point of Measurement: From Medial Instep 22.5 cm Vascular Assessment Pulses: Dorsalis Pedis Palpable: [Right:Yes] Electronic Signature(s) Signed: 08/16/2022 7:49:05 AM By: Brenton Grills Entered By: Brenton Grills on 07/11/2022 12:37:10 -------------------------------------------------------------------------------- Multi Wound Chart Details Patient Name: Date of Service: [name] 07/11/2022 12:30 PM Medical Record Number: [id number] Patient Account Number: [id number] Date of Birth/Sex: Treating RN: May 10, 1933 (89 y.o. M) Primary Care Latima Hamza: Franchot Erichsen Other Clinician: Referring Jacqulyne Gladue: Treating Mickaela Starlin/Extender: Leafy Ro, Benetta Spar Weeks in Treatment: 1 Vital Signs Height(in): Capillary Blood Glucose(mg/dl): 706 Weight(lbs): Pulse(bpm): 94 Body Mass Index(BMI): Blood Pressure(mmHg): 146/79 Temperature(F): 97.7 Respiratory Rate(breaths/min): 18 [3:Photos:] [N/A:N/A] Right Amputation Site - Toe N/A N/A Wound Location: Gradually Appeared N/A N/A Wounding Event: Diabetic Wound/Ulcer of the Lower N/A N/A Primary Etiology: Extremity Cataracts, Anemia, Asthma, Chronic N/A N/A Comorbid History: Obstructive Pulmonary Disease (COPD), Peripheral Arterial Disease, Type II Diabetes, Gout, Osteoarthritis, Neuropathy 04/07/2022 N/A N/A Date Acquired: 1 N/A N/A Weeks of Treatment: Open N/A N/A Wound Status: No N/A N/A Wound Recurrence: 2.3x0.7x0.1 N/A N/A Measurements L x W x D (cm) 1.264 N/A N/A A (cm) : rea 0.126 N/A N/A Volume (cm) : 60.30% N/A N/A % Reduction in A rea: 86.80% N/A N/A % Reduction in Volume: Grade 1 N/A N/A Classification: Medium N/A N/A Exudate A mount: Serosanguineous N/A N/A Exudate Type: red, brown N/A N/A Exudate Color: Epibole N/A N/A Wound Margin: Large (67-100%) N/A N/A Granulation A  mountSolomon [name], [name] ([id number]) [id number] Page 4 of 8 Red, Pink N/A N/A Granulation Quality: Small (1-33%) N/A N/A Necrotic Amount: Fat Layer (Subcutaneous Tissue): Yes N/A N/A Exposed Structures: Fascia: No Tendon: No Muscle: No Joint: No Bone: No Small (1-33%) N/A N/A Epithelialization: Excoriation: No N/A N/A Periwound Skin Texture: Induration: No Callus: No Crepitus: No Rash: No Scarring: No Maceration: No N/A N/A Periwound Skin Moisture: Dry/Scaly: No Atrophie Blanche: No N/A N/A Periwound Skin Color: Cyanosis: No Ecchymosis: No Erythema: No Hemosiderin Staining: No Mottled: No Pallor: No Rubor: No No Abnormality N/A N/A Temperature: Treatment Notes Wound #3 (Amputation Site - Toe) Wound Laterality: Right Cleanser Vashe 5.8 (oz) Discharge Instruction: Cleanse the wound with Vashe prior to applying a clean  dressing using gauze sponges, not tissue or cotton balls. Peri-Wound Care Topical Primary Dressing Hydrofera Blue Ready Transfer Foam, 4x5 (in/in) Discharge Instruction: Apply to wound bed as instructed Secondary Dressing Woven Gauze Sponge, Non-Sterile 4x4 in Discharge Instruction: Apply over primary dressing as directed. Secured With 38M Medipore H Soft Cloth Surgical T ape, 4 x 10 (in/yd) Discharge Instruction: Secure with tape as directed. Compression Wrap Compression Stockings Add-Ons Electronic Signature(s) Signed: 07/11/2022 4:51:59 PM By: [name] MD Entered By: [name] on 07/11/2022 13:11:58 -------------------------------------------------------------------------------- Multi-Disciplinary Care Plan Details Patient Name: Date of Service: [name] 07/11/2022 12:30 PM Medical Record Number: [id number] Patient Account Number: [id number] Date of Birth/Sex: Treating RN: 1933/10/29 (89 y.o. [name] Primary Care Teea Ducey: Franchot Erichsen Other Clinician: Referring  Daegon Deiss: Treating Aujanae Mccullum/Extender: Scherrie Merritts in Treatment: 1 Active Inactive Orientation to the [facility] [place], [place] ([id number]) [id number] Page 5 of 8 Nursing Diagnoses: Knowledge deficit related to the wound healing center program Goals: Patient/caregiver will verbalize understanding of the Wound Healing Center Program Date Initiated: 07/04/2022 Target Resolution Date: 07/14/2022 Goal Status: Active Interventions: Provide education on orientation to the wound center Notes: Wound/Skin Impairment Nursing Diagnoses: Impaired tissue integrity Knowledge deficit related to ulceration/compromised skin integrity Goals: Patient will have a decrease in wound volume by X% from date: (specify in notes) Date Initiated: 07/04/2022 Target Resolution Date: 07/15/2022 Goal Status: Active Patient/caregiver will verbalize understanding of skin care regimen Date Initiated: 07/04/2022 Target Resolution Date: 07/16/2022 Goal Status: Active Ulcer/skin breakdown will have a volume reduction of 30% by week 4 Date Initiated: 07/04/2022 Target Resolution Date: 07/16/2022 Goal Status: Active Ulcer/skin breakdown will have a volume reduction of 50% by week 8 Date Initiated: 07/04/2022 Target Resolution Date: 07/16/2022 Goal Status: Active Interventions: Assess patient/caregiver ability to obtain necessary supplies Assess patient/caregiver ability to perform ulcer/skin care regimen upon admission and as needed Assess ulceration(s) every visit Notes: Electronic Signature(s) Signed: 08/16/2022 7:49:05 AM By: Brenton Grills Entered By: Brenton Grills on 07/11/2022 12:43:21 -------------------------------------------------------------------------------- Pain Assessment Details Patient Name: Date of Service: [name] 07/11/2022 12:30 PM Medical Record Number: [id number] Patient Account Number: [id number] Date of Birth/Sex:  Treating RN: 30-May-1933 (89 y.o. [name] Primary Care Bolivar Koranda: Franchot Erichsen Other Clinician: Referring Naethan Bracewell: Treating Luby Seamans/Extender: Hunt Oris Weeks in Treatment: 1 Active Problems Location of Pain Severity and Description of Pain Patient Has Paino No Site Locations [place], [place] ([id number]) [id number] Page 6 of 8 Pain Management and Medication Current Pain Management: Electronic Signature(s) Signed: 08/16/2022 7:49:05 AM By: Brenton Grills Entered By: Brenton Grills on 07/11/2022 12:32:24 -------------------------------------------------------------------------------- Patient/Caregiver Education Details Patient Name: Date of Service: [name] 5/6/2024andnbsp12:30 PM Medical Record Number: [id number] Patient Account Number: [id number] Date of Birth/Gender: Treating RN: Nov 14, 1933 (89 y.o. [name] Primary Care Physician: Franchot Erichsen Other Clinician: Referring Physician: Treating Physician/Extender: Scherrie Merritts in Treatment: 1 Education Assessment Education Provided To: Patient and Caregiver Education Topics Provided Wound/Skin Impairment: Methods: Explain/Verbal Responses: State content correctly Electronic Signature(s) Signed: 08/16/2022 7:49:05 AM By: Brenton Grills Entered By: Brenton Grills on 07/11/2022 12:43:41 -------------------------------------------------------------------------------- Wound Assessment Details Patient Name: Date of Service: [name] 07/11/2022 12:30 PM Medical Record Number: [id number] Patient Account Number: [id number] Date of Birth/Sex: Treating RN: 05-07-1933 (89 y.o. [name] Primary Care Takeria Marquina: Franchot Erichsen Other Clinician: Referring Bo Teicher: Treating Tenzin Edelman/Extender: Hunt Oris Weeks in Treatment: 1 Wound Status Wound Number: 3 Primary  Diabetic Wound/Ulcer of  the Lower Extremity Etiology: Wound Location: Right Amputation Site - Toe Wound Open Wounding Event: Gradually Appeared Status: Date Acquired: 04/07/2022 Comorbid Cataracts, Anemia, Asthma, Chronic Obstructive Pulmonary Weeks Of Treatment: 1 History: Disease (COPD), Peripheral Arterial Disease, Type II Diabetes, Clustered Wound: No Gout, Osteoarthritis, Neuropathy [name], [name] ([id number]) [id number] Page 7 of 8 Photos Wound Measurements Length: (cm) 2.3 Width: (cm) 0.7 Depth: (cm) 0.1 Area: (cm) 1.264 Volume: (cm) 0.126 % Reduction in Area: 60.3% % Reduction in Volume: 86.8% Epithelialization: Small (1-33%) Tunneling: No Undermining: No Wound Description Classification: Grade 1 Wound Margin: Epibole Exudate Amount: Medium Exudate Type: Serosanguineous Exudate Color: red, brown Foul Odor After Cleansing: No Slough/Fibrino Yes Wound Bed Granulation Amount: Large (67-100%) Exposed Structure Granulation Quality: Red, Pink Fascia Exposed: No Necrotic Amount: Small (1-33%) Fat Layer (Subcutaneous Tissue) Exposed: Yes Necrotic Quality: Adherent Slough Tendon Exposed: No Muscle Exposed: No Joint Exposed: No Bone Exposed: No Periwound Skin Texture Texture Color No Abnormalities Noted: No No Abnormalities Noted: No Callus: No Atrophie Blanche: No Crepitus: No Cyanosis: No Excoriation: No Ecchymosis: No Induration: No Erythema: No Rash: No Hemosiderin Staining: No Scarring: No Mottled: No Pallor: No Moisture Rubor: No No Abnormalities Noted: No Dry / Scaly: No Temperature / Pain Maceration: No Temperature: No Abnormality Electronic Signature(s) Signed: 08/16/2022 7:49:05 AM By: Brenton Grills Entered By: Brenton Grills on 07/11/2022 12:41:44 -------------------------------------------------------------------------------- Vitals Details Patient Name: Date of Service: [name] 07/11/2022 12:30 PM Medical Record  Number: [id number] Patient Account Number: [id number] Date of Birth/Sex: Treating RN: 01-11-1934 (88 y.o. [name] Primary Care Chaunice Obie: Franchot Erichsen Other Clinician: Referring Adaleah Forget: Treating Blossie Raffel/Extender: Hunt Oris Weeks in Treatment: 1 Vital Signs Time Taken: 12:30 Temperature (F): 97.7 Pulse (bpm): 94 Respiratory Rate (breaths/min): 18 [name], [name] ([id number]) [id number] Page 8 of 8 Respiratory Rate (breaths/min): 18 Blood Pressure (mmHg): 146/79 Capillary Blood Glucose (mg/dl): 932 Reference Range: 80 - 120 mg / dl Electronic Signature(s) Signed: 08/16/2022 7:49:05 AM By: Brenton Grills Entered By: Brenton Grills on 07/11/2022 12:32:09

## 2022-08-16 NOTE — Progress Notes (Signed)
[name], [name] ([id number]) [id number] Page 1 of 8 Visit Report for 08/15/2022 Arrival Information Details Patient Name: Date of Service: [name] 08/15/2022 2:00 PM Medical Record Number: [id number] Patient Account Number: [id number] Date of Birth/Sex: Treating RN: Mar 19, 1933 (89 y.o. M) Primary Care [name]: [name] Other Clinician: Referring [name]: Treating [name]/Extender: [name] Weeks in Treatment: 6 Visit Information History Since Last Visit Added or deleted any medications: No Patient Arrived: Ambulatory Any new allergies or adverse reactions: No Arrival Time: 14:01 Had a fall or experienced change in No Accompanied By: daughter activities of daily living that may affect Transfer Assistance: None risk of falls: Patient Identification Verified: Yes Signs or symptoms of abuse/neglect since last visito No Secondary Verification Process Completed: Yes Hospitalized since last visit: No Patient Requires Transmission-Based Precautions: No Implantable device outside of the clinic excluding No Patient Has Alerts: Yes cellular tissue based products placed in the center Patient Alerts: Patient on Blood Thinner since last visit: Has Dressing in Place as Prescribed: Yes Pain Present Now: Yes Electronic Signature(s) Signed: 08/15/2022 3:42:51 PM By: Thayer Dallas Entered By: Thayer Dallas on 08/15/2022 14:04:40 -------------------------------------------------------------------------------- Clinic Level of Care Assessment Details Patient Name: Date of Service: [name] 08/15/2022 2:00 PM Medical Record Number: [id number] Patient Account Number: [id number] Date of Birth/Sex: Treating RN: [birth year] (89 y.o. [name] Primary Care [name]: [name] Other Clinician: Referring [name]: Treating [name]/Extender: [name] Weeks in Treatment:  6 Clinic Level of Care Assessment Items TOOL 1 Quantity Score X- 1 0 Use when EandM and Procedure is performed on INITIAL visit ASSESSMENTS - Nursing Assessment / Reassessment X- 1 20 General Physical Exam (combine w/ comprehensive assessment (listed just below) when performed on new pt. evals) X- 1 25 Comprehensive Assessment (HX, ROS, Risk Assessments, Wounds Hx, etc.) ASSESSMENTS - Wound and Skin Assessment / Reassessment X- 1 10 Dermatologic / Skin Assessment (not related to wound area) ASSESSMENTS - Ostomy and/or Continence Assessment and Care []  - 0 Incontinence Assessment and Management []  - 0 Ostomy Care Assessment and Management (repouching, etc.) PROCESS - Coordination of Care X - Simple Patient / Family Education for ongoing care 1 15 []  - 0 Complex (extensive) Patient / Family Education for ongoing care X- 1 10 Staff obtains Chiropractor, Records, T Results / Process Orders est []  - 0 Staff telephones HHA, Nursing Homes / Clarify orders / etc []  - 0 Routine Transfer to another Facility (non-emergent condition) []  - 0 Routine Hospital Admission (non-emergent condition) []  - 0 New Admissions / Insurance Authorizations / Ordering NPWT Apligraf, etcMonroe, Qin, [name] ([id number]) [id number] Page 2 of 8 []  - 0 Emergency Hospital Admission (emergent condition) PROCESS - Special Needs []  - 0 Pediatric / Minor Patient Management []  - 0 Isolation Patient Management []  - 0 Hearing / Language / Visual special needs []  - 0 Assessment of Community assistance (transportation, D/C planning, etc.) []  - 0 Additional assistance / Altered mentation []  - 0 Support Surface(s) Assessment (bed, cushion, seat, etc.) INTERVENTIONS - Miscellaneous []  - 0 External ear exam []  - 0 Patient Transfer (multiple staff / Nurse, adult / Similar devices) []  - 0 Simple Staple / Suture removal (25 or less) []  - 0 Complex Staple / Suture removal (26 or  more) []  - 0 Hypo/Hyperglycemic Management (do not check if billed separately) []  - 0 Ankle / Brachial Index (ABI) - do not check if billed separately Has the patient been seen at the hospital  within the last three years: Yes Total Score: 80 Level Of Care: New/Established - Level 3 Electronic Signature(s) Signed: 08/16/2022 7:50:28 AM By: Brenton Grills Entered By: Brenton Grills on 08/15/2022 14:29:20 -------------------------------------------------------------------------------- Encounter Discharge Information Details Patient Name: Date of Service: [name] 08/15/2022 2:00 PM Medical Record Number: [id number] Patient Account Number: [id number] Date of Birth/Sex: Treating RN: [birth year] (89 y.o. [name] Primary Care Jaleesa Cervi: [name] Other Clinician: Referring Ziad Maye: Treating Dajha Urquilla/Extender: Candace Gallus in Treatment: 6 Encounter Discharge Information Items Discharge Condition: Stable Ambulatory Status: Ambulatory Discharge Destination: Home Transportation: Private Auto Accompanied By: daughter Schedule Follow-up Appointment: Yes Clinical Summary of Care: Patient Declined Electronic Signature(s) Signed: 08/16/2022 7:50:28 AM By: Brenton Grills Entered By: Brenton Grills on 08/15/2022 14:32:02 -------------------------------------------------------------------------------- Lower Extremity Assessment Details Patient Name: Date of Service: [name] 08/15/2022 2:00 PM Medical Record Number: [id number] Patient Account Number: [id number] Date of Birth/Sex: Treating RN: 1933-09-04 (89 y.o. M) Primary Care Temiloluwa Laredo: [name] Other Clinician: Referring Xareni Kelch: Treating Cathan Gearin/Extender: [name] Weeks in Treatment: 6 Edema Assessment Assessed: [Left: No] [Right: No] Edema: [Left: N] [Right: o] R[LeftElyn [name], [name] ([id number])] [Right:  [id number] Page 3 of 8] Calf Left: Right: Point of Measurement: From Medial Instep 32.6 cm Ankle Left: Right: Point of Measurement: From Medial Instep 21 cm Electronic Signature(s) Signed: 08/15/2022 3:42:51 PM By: Thayer Dallas Entered By: Thayer Dallas on 08/15/2022 14:07:55 -------------------------------------------------------------------------------- Multi Wound Chart Details Patient Name: Date of Service: [name] 08/15/2022 2:00 PM Medical Record Number: [id number] Patient Account Number: [id number] Date of Birth/Sex: Treating RN: 23-Oct-1933 (89 y.o. M) Primary Care Jianni Shelden: [name] Other Clinician: Referring Rafik Koppel: Treating Daleena Rotter/Extender: [name] Weeks in Treatment: 6 Vital Signs Height(in): Capillary Blood Glucose(mg/dl): 875 Weight(lbs): Pulse(bpm): 106 Body Mass Index(BMI): Blood Pressure(mmHg): 146/77 Temperature(F): Respiratory Rate(breaths/min): 18 [3:Photos:] [N/A:N/A] Right Amputation Site - Toe N/A N/A Wound Location: Gradually Appeared N/A N/A Wounding Event: Diabetic Wound/Ulcer of the Lower N/A N/A Primary Etiology: Extremity Cataracts, Anemia, Asthma, Chronic N/A N/A Comorbid History: Obstructive Pulmonary Disease (COPD), Peripheral Arterial Disease, Type II Diabetes, Gout, Osteoarthritis, Neuropathy 04/07/2022 N/A N/A Date Acquired: 6 N/A N/A Weeks of Treatment: Open N/A N/A Wound Status: No N/A N/A Wound Recurrence: 1.4x0.4x0.2 N/A N/A Measurements L x W x D (cm) 0.44 N/A N/A A (cm) : rea 0.088 N/A N/A Volume (cm) : 86.20% N/A N/A % Reduction in A rea: 90.80% N/A N/A % Reduction in Volume: Grade 1 N/A N/A Classification: Medium N/A N/A Exudate A mount: Serosanguineous N/A N/A Exudate Type: red, brown N/A N/A Exudate Color: Epibole N/A N/A Wound Margin: Large (67-100%) N/A N/A Granulation A mount: Red, Pink N/A N/A Granulation Quality: None  Present (0%) N/A N/A Necrotic A mount: Fat Layer (Subcutaneous Tissue): Yes N/A N/A Exposed Structures: Fascia: No Tendon: No Muscle: No Joint: No Bone: No Small (1-33%) N/A N/A Epithelialization: Excoriation: No N/A N/A Periwound Skin TextureChriss [name], [name] ([id number]) [id number] Page 4 of 8 Induration: No Callus: No Crepitus: No Rash: No Scarring: No Maceration: Yes N/A N/A Periwound Skin Moisture: Dry/Scaly: No Atrophie Blanche: No N/A N/A Periwound Skin Color: Cyanosis: No Ecchymosis: No Erythema: No Hemosiderin Staining: No Mottled: No Pallor: No Rubor: No No Abnormality N/A N/A Temperature: Treatment Notes Wound #3 (Amputation Site - Toe) Wound Laterality: Right Cleanser Vashe 5.8 (oz) Discharge Instruction: Cleanse the wound with Vashe prior to applying a clean dressing using gauze sponges, not tissue or cotton balls.  Peri-Wound Care Topical Blastex Primary Dressing Promogran Prisma Matrix, 4.34 (sq in) (silver collagen) Discharge Instruction: Moisten collagen with saline or hydrogel Secondary Dressing Woven Gauze Sponge, Non-Sterile 4x4 in Discharge Instruction: Apply over primary dressing as directed. Secured With Paper Tape, 2x10 (in/yd) Discharge Instruction: Secure dressing with tape as directed. Compression Wrap Compression Stockings Add-Ons Electronic Signature(s) Signed: 08/15/2022 4:48:17 PM By: [name] DO Entered By: [name] on 08/15/2022 14:56:06 -------------------------------------------------------------------------------- Multi-Disciplinary Care Plan Details Patient Name: Date of Service: [name] 08/15/2022 2:00 PM Medical Record Number: [id number] Patient Account Number: [id number] Date of Birth/Sex: Treating RN: Mar 22, 1933 (89 y.o. [name] Primary Care Allyssia Skluzacek: [name] Other Clinician: Referring Shaquella Stamant: Treating Jamerson Vonbargen/Extender: [name] Weeks in Treatment: 6 Active Inactive Orientation to the Wound Care Program Nursing Diagnoses: Knowledge deficit related to the wound healing center program Goals: Patient/caregiver will verbalize understanding of the Wound Healing Center Program Date Initiated: 07/04/2022 Target Resolution Date: 11/05/2022 Goal Status: Active [name], [name] ([id number]) [id number] Page 5 of 8 Interventions: Provide education on orientation to the wound center Notes: Wound/Skin Impairment Nursing Diagnoses: Impaired tissue integrity Knowledge deficit related to ulceration/compromised skin integrity Goals: Patient will have a decrease in wound volume by X% from date: (specify in notes) Date Initiated: 07/04/2022 Target Resolution Date: 11/05/2022 Goal Status: Active Patient/caregiver will verbalize understanding of skin care regimen Date Initiated: 07/04/2022 Target Resolution Date: 11/05/2022 Goal Status: Active Ulcer/skin breakdown will have a volume reduction of 30% by week 4 Date Initiated: 07/04/2022 Target Resolution Date: 11/05/2022 Goal Status: Active Ulcer/skin breakdown will have a volume reduction of 50% by week 8 Date Initiated: 07/04/2022 Target Resolution Date: 11/05/2022 Goal Status: Active Interventions: Assess patient/caregiver ability to obtain necessary supplies Assess patient/caregiver ability to perform ulcer/skin care regimen upon admission and as needed Assess ulceration(s) every visit Notes: Electronic Signature(s) Signed: 08/16/2022 7:50:28 AM By: Brenton Grills Entered By: Brenton Grills on 08/15/2022 14:11:10 -------------------------------------------------------------------------------- Pain Assessment Details Patient Name: Date of Service: [name] 08/15/2022 2:00 PM Medical Record Number: [id number] Patient Account Number: [id number] Date of Birth/Sex: Treating RN: 12-20-1933 (89 y.o. M) Primary  Care Nikolette Reindl: [name] Other Clinician: Referring [name] Wanzer: Treating Stephonie Wilcoxen/Extender: [name] Weeks in Treatment: 6 Active Problems Location of Pain Severity and Description of Pain Patient Has Paino Yes Site Locations Pain Location: Pain in Ulcers Rate the pain. Current Pain Level: 2 Pain Management and Medication Current Pain Management: [name], [name] ([id number]) [id number] Page 6 of 8 Electronic Signature(s) Signed: 08/15/2022 3:42:51 PM By: Thayer Dallas Entered By: Thayer Dallas on 08/15/2022 14:04:54 -------------------------------------------------------------------------------- Patient/Caregiver Education Details Patient Name: Date of Service: [name] 6/10/2024andnbsp2:00 PM Medical Record Number: [id number] Patient Account Number: [id number] Date of Birth/Gender: Treating RN: November 18, 1933 (89 y.o. [name] Primary Care Physician: [name] Other Clinician: Referring Physician: Treating Physician/Extender: Candace Gallus in Treatment: 6 Education Assessment Education Provided To: Patient and Caregiver Education Topics Provided Wound/Skin Impairment: Methods: Explain/Verbal Responses: State content correctly Electronic Signature(s) Signed: 08/16/2022 7:50:28 AM By: Brenton Grills Entered By: Brenton Grills on 08/15/2022 14:11:35 -------------------------------------------------------------------------------- Wound Assessment Details Patient Name: Date of Service: [name] 08/15/2022 2:00 PM Medical Record Number: [id number] Patient Account Number: [id number] Date of Birth/Sex: Treating RN: 1933/05/25 (89 y.o. M) Primary Care Neil Errickson: [name] Other Clinician: Referring Candice Lunney: Treating Emorie Mcfate/Extender: [name] Weeks in Treatment: 6 Wound Status Wound Number: 3 Primary Diabetic Wound/Ulcer of  the Lower  Extremity Etiology: Wound Location: Right Amputation Site - Toe Wound Open Wounding Event: Gradually Appeared Status: Date Acquired: 04/07/2022 Comorbid Cataracts, Anemia, Asthma, Chronic Obstructive Pulmonary Weeks Of Treatment: 6 History: Disease (COPD), Peripheral Arterial Disease, Type II Diabetes, Clustered Wound: No Gout, Osteoarthritis, Neuropathy Photos Wound Measurements Length: (cm) 1.4 Width: (cm) 0.4 Depth: (cm) 0.2 [name], [name] ([id number]) Area: (cm) 0.44 Volume: (cm) 0.088 % Reduction in Area: 86.2% % Reduction in Volume: 90.8% Epithelialization: Small (1-33%) [id number] Page 7 of 8 Tunneling: No Undermining: No Wound Description Classification: Grade 1 Wound Margin: Epibole Exudate Amount: Medium Exudate Type: Serosanguineous Exudate Color: red, brown Foul Odor After Cleansing: No Slough/Fibrino Yes Wound Bed Granulation Amount: Large (67-100%) Exposed Structure Granulation Quality: Red, Pink Fascia Exposed: No Necrotic Amount: None Present (0%) Fat Layer (Subcutaneous Tissue) Exposed: Yes Tendon Exposed: No Muscle Exposed: No Joint Exposed: No Bone Exposed: No Periwound Skin Texture Texture Color No Abnormalities Noted: No No Abnormalities Noted: No Callus: No Atrophie Blanche: No Crepitus: No Cyanosis: No Excoriation: No Ecchymosis: No Induration: No Erythema: No Rash: No Hemosiderin Staining: No Scarring: No Mottled: No Pallor: No Moisture Rubor: No No Abnormalities Noted: No Dry / Scaly: No Temperature / Pain Maceration: Yes Temperature: No Abnormality Treatment Notes Wound #3 (Amputation Site - Toe) Wound Laterality: Right Cleanser Vashe 5.8 (oz) Discharge Instruction: Cleanse the wound with Vashe prior to applying a clean dressing using gauze sponges, not tissue or cotton balls. Peri-Wound Care Topical Blastex Primary Dressing Promogran Prisma Matrix, 4.34 (sq in) (silver  collagen) Discharge Instruction: Moisten collagen with saline or hydrogel Secondary Dressing Woven Gauze Sponge, Non-Sterile 4x4 in Discharge Instruction: Apply over primary dressing as directed. Secured With Paper Tape, 2x10 (in/yd) Discharge Instruction: Secure dressing with tape as directed. Compression Wrap Compression Stockings Add-Ons Electronic Signature(s) Signed: 08/16/2022 7:50:28 AM By: Brenton Grills Entered By: Brenton Grills on 08/15/2022 14:10:01 -------------------------------------------------------------------------------- Vitals Details Patient Name: Date of Service: [name] 08/15/2022 2:00 PM Medical Record Number: [id number] Patient Account Number: [id number] [name], [name] ([id number]) [id number] Page 8 of 8 Date of Birth/Sex: Treating RN: December 19, 1933 (89 y.o. M) Primary Care Angelice Piech: [name] Other Clinician: Referring Lavontay Kirk: Treating Sarahlynn Cisnero/Extender: [name] Weeks in Treatment: 6 Vital Signs Time Taken: 14:05 Pulse (bpm): 106 Respiratory Rate (breaths/min): 18 Blood Pressure (mmHg): 146/77 Capillary Blood Glucose (mg/dl): 725 Reference Range: 80 - 120 mg / dl Electronic Signature(s) Signed: 08/15/2022 3:42:51 PM By: Thayer Dallas Entered By: Thayer Dallas on 08/15/2022 14:07:00

## 2022-08-16 NOTE — Progress Notes (Signed)
[name], [name] ([id number]) [id number] Page 1 of 4 Visit Report for 07/11/2022 HPI Details Patient Name: Date of Service: [name] 07/11/2022 12:30 PM Medical Record Number: [id number] Patient Account Number: [id number] Date of Birth/Sex: Treating RN: [birth year] (89 y.o. M) Primary Care Provider: Franchot Erichsen Other Clinician: Referring Provider: Treating Provider/Extender: Leafy Ro, Valentino Hue in Treatment: 1 History of Present Illness HPI Description: 07/04/2022 [name] is an 89 year old male with a past medical history of uncontrolled, insulin dependent type 2 diabetes, CKD stage III, and COPD that presents to the clinic for a right foot wound. He has an extensive history of peripheral arterial disease to the right leg. He has had a right superficial femoral to posterior tibial artery bypass by Dr. Elonda Husky in 2020. He had a right common femoral to plantar posterior tibial artery bypass on 02/03/2022 by Dr. Lenell Antu and at that time he also had a right toe amputation. He states this wound site had healed completely. Prior to this he had amputations to the first and second digit to the right foot. He presents today with an open wound to the previous right toe amputation site for the past 2 months. He has been following with vein and vascular for this issue. Most recent ABIs were on 06/22/2022 that showed absent waveforms in the right lower extremity. Currently his right lower extremity bypass is occluded and no plan for another anteriorgram and has he likely has no revascularization options. Currently he has been using Betadine to the wound bed. He denies signs of infection. 07/11/2022 patient arrives in clinic today using Hydrofera Blue on the toe amputation site. The patient is revascularized and is not felt to have any other options for revascularization. We are using Hydrofera Blue and gauze. Electronic  Signature(s) Signed: 07/11/2022 4:51:59 PM By: [name] MD Entered By: [name] on 07/11/2022 13:13:25 -------------------------------------------------------------------------------- Physical Exam Details Patient Name: Date of Service: [name] 07/11/2022 12:30 PM Medical Record Number: [id number] Patient Account Number: [id number] Date of Birth/Sex: Treating RN: 1934-02-18 (89 y.o. M) Primary Care Provider: Franchot Erichsen Other Clinician: Referring Provider: Treating Provider/Extender: Leafy Ro, Valentino Hue in Treatment: 1 Constitutional Patient is hypertensive.. Pulse regular and within target range for patient.Marland Kitchen Respirations regular, non-labored and within target range.. Temperature is normal and within the target range for the patient.Marland Kitchen Appears in no distress. Cardiovascular On the right the posterior tibial and dorsalis pedis pulses are reduced but palpable. Notes Wound exam; third toe amputation site really has healthy granulation with a nice rim of epithelialization. His foot is warm. No signs of surrounding infection. Electronic Signature(s) Signed: 07/11/2022 4:51:59 PM By: [name] MD Entered By: [name] on 07/11/2022 13:16:16 -------------------------------------------------------------------------------- Physician Orders Details Patient Name: Date of Service: [name] 07/11/2022 12:30 PM Medical Record Number: [id number] Patient Account Number: [id number] Date of Birth/Sex: Treating RN: 07-27-1933 (89 y.o. [name] Primary Care Provider: Franchot Erichsen Other Clinician: Referring Provider: Treating Provider/Extender: Scherrie Merritts in Treatment: 1 Verbal / Phone Orders: No Diagnosis Coding [place], [name] ([id number]) [id number] Page 2 of 4 Follow-up Appointments ppointment in 1 week. - w/ Dr. Mikey Bussing and Veneda Melter # 9 Return  A Anesthetic (In clinic) Topical Lidocaine 5% applied to wound bed Bathing/ Shower/ Hygiene May shower with protection but do not get wound dressing(s) wet. Protect dressing(s) with water repellant cover (for example, large plastic bag) or a cast cover and may then take shower. Edema  Control - Lymphedema / SCD / Other Elevate legs to the level of the heart or above for 30 minutes daily and/or when sitting for 3-4 times a day throughout the day. Avoid standing for long periods of time. Wound Treatment Wound #3 - Amputation Site - Toe Wound Laterality: Right Cleanser: Vashe 5.8 (oz) 1 x Per Day/15 Days Discharge Instructions: Cleanse the wound with Vashe prior to applying a clean dressing using gauze sponges, not tissue or cotton balls. Prim Dressing: Hydrofera Blue Ready Transfer Foam, 4x5 (in/in) (Generic) 1 x Per Day/15 Days ary Discharge Instructions: Apply to wound bed as instructed Secondary Dressing: Woven Gauze Sponge, Non-Sterile 4x4 in (Generic) 1 x Per Day/15 Days Discharge Instructions: Apply over primary dressing as directed. Secured With: 50M Medipore H Soft Cloth Surgical T ape, 4 x 10 (in/yd) (Generic) 1 x Per Day/15 Days Discharge Instructions: Secure with tape as directed. Electronic Signature(s) Signed: 07/11/2022 4:51:59 PM By: [name] MD Signed: 08/16/2022 7:49:05 AM By: Brenton Grills Entered By: Brenton Grills on 07/11/2022 12:43:05 -------------------------------------------------------------------------------- Problem List Details Patient Name: Date of Service: [name] 07/11/2022 12:30 PM Medical Record Number: [id number] Patient Account Number: [id number] Date of Birth/Sex: Treating RN: 1933/08/20 (89 y.o. M) Primary Care Provider: Franchot Erichsen Other Clinician: Referring Provider: Treating Provider/Extender: Leafy Ro, Valentino Hue in Treatment: 1 Active Problems ICD-10 Encounter Code Description Active Date  MDM Diagnosis L97.512 Non-pressure chronic ulcer of other part of right foot with fat layer exposed 07/04/2022 No Yes E11.621 Type 2 diabetes mellitus with foot ulcer 07/04/2022 No Yes I70.235 Atherosclerosis of native arteries of right leg with ulceration of other part of 07/04/2022 No Yes foot I73.9 Peripheral vascular disease, unspecified 07/04/2022 No Yes Inactive Problems Resolved Problems [name], [name] ([id number]) [phone].pdf Page 3 of 4 Electronic Signature(s) Signed: 07/11/2022 4:51:59 PM By: [name] MD Entered By: [name] on 07/11/2022 13:11:46 -------------------------------------------------------------------------------- Progress Note Details Patient Name: Date of Service: [name] 07/11/2022 12:30 PM Medical Record Number: [id number] Patient Account Number: [id number] Date of Birth/Sex: Treating RN: 1933/05/30 (89 y.o. M) Primary Care Provider: Franchot Erichsen Other Clinician: Referring Provider: Treating Provider/Extender: Leafy Ro, Benetta Spar Weeks in Treatment: 1 Subjective History of Present Illness (HPI) 07/04/2022 [name] is an 89 year old male with a past medical history of uncontrolled, insulin dependent type 2 diabetes, CKD stage III, and COPD that presents to the clinic for a right foot wound. He has an extensive history of peripheral arterial disease to the right leg. He has had a right superficial femoral to posterior tibial artery bypass by Dr. Elonda Husky in 2020. He had a right common femoral to plantar posterior tibial artery bypass on 02/03/2022 by Dr. Lenell Antu and at that time he also had a right toe amputation. He states this wound site had healed completely. Prior to this he had amputations to the first and second digit to the right foot. He presents today with an open wound to the previous right toe amputation site for the past 2 months. He has been following with vein and  vascular for this issue. Most recent ABIs were on 06/22/2022 that showed absent waveforms in the right lower extremity. Currently his right lower extremity bypass is occluded and no plan for another anteriorgram and has he likely has no revascularization options. Currently he has been using Betadine to the wound bed. He denies signs of infection. 07/11/2022 patient arrives in clinic today using Hydrofera Blue on the toe amputation site. The patient  is revascularized and is not felt to have any other options for revascularization. We are using Hydrofera Blue and gauze. Objective Constitutional Patient is hypertensive.. Pulse regular and within target range for patient.Marland Kitchen Respirations regular, non-labored and within target range.. Temperature is normal and within the target range for the patient.Marland Kitchen Appears in no distress. Vitals Time Taken: 12:30 PM, Temperature: 97.7 F, Pulse: 94 bpm, Respiratory Rate: 18 breaths/min, Blood Pressure: 146/79 mmHg, Capillary Blood Glucose: 111 mg/dl. Cardiovascular On the right the posterior tibial and dorsalis pedis pulses are reduced but palpable. General Notes: Wound exam; third toe amputation site really has healthy granulation with a nice rim of epithelialization. His foot is warm. No signs of surrounding infection. Integumentary (Hair, Skin) Wound #3 status is Open. Original cause of wound was Gradually Appeared. The date acquired was: 04/07/2022. The wound has been in treatment 1 weeks. The wound is located on the Right Amputation Site - T The wound measures 2.3cm length x 0.7cm width x 0.1cm depth; 1.264cm^2 area and 0.126cm^3 volume. oe. There is Fat Layer (Subcutaneous Tissue) exposed. There is no tunneling or undermining noted. There is a medium amount of serosanguineous drainage noted. The wound margin is epibole. There is large (67-100%) red, pink granulation within the wound bed. There is a small (1-33%) amount of necrotic tissue within the wound bed  including Adherent Slough. The periwound skin appearance did not exhibit: Callus, Crepitus, Excoriation, Induration, Rash, Scarring, Dry/Scaly, Maceration, Atrophie Blanche, Cyanosis, Ecchymosis, Hemosiderin Staining, Mottled, Pallor, Rubor, Erythema. Periwound temperature was noted as No Abnormality. Assessment Active Problems ICD-10 Non-pressure chronic ulcer of other part of right foot with fat layer exposed Type 2 diabetes mellitus with foot ulcer Atherosclerosis of native arteries of right leg with ulceration of other part of foot Peripheral vascular disease, unspecified [name], [name] ([id number]) [phone].pdf Page 4 of 4 Plan Follow-up Appointments: Return Appointment in 1 week. - w/ Dr. Mikey Bussing and Veneda Melter # 9 Anesthetic: (In clinic) Topical Lidocaine 5% applied to wound bed Bathing/ Shower/ Hygiene: May shower with protection but do not get wound dressing(s) wet. Protect dressing(s) with water repellant cover (for example, large plastic bag) or a cast cover and may then take shower. Edema Control - Lymphedema / SCD / Other: Elevate legs to the level of the heart or above for 30 minutes daily and/or when sitting for 3-4 times a day throughout the day. Avoid standing for long periods of time. WOUND #3: - Amputation Site - T oe Wound Laterality: Right Cleanser: Vashe 5.8 (oz) 1 x Per Day/15 Days Discharge Instructions: Cleanse the wound with Vashe prior to applying a clean dressing using gauze sponges, not tissue or cotton balls. Prim Dressing: Hydrofera Blue Ready Transfer Foam, 4x5 (in/in) (Generic) 1 x Per Day/15 Days ary Discharge Instructions: Apply to wound bed as instructed Secondary Dressing: Woven Gauze Sponge, Non-Sterile 4x4 in (Generic) 1 x Per Day/15 Days Discharge Instructions: Apply over primary dressing as directed. Secured With: 31M Medipore H Soft Cloth Surgical T ape, 4 x 10 (in/yd) (Generic) 1 x Per Day/15 Days Discharge  Instructions: Secure with tape as directed. 1. The patient's wound actually looks quite good in spite of his ominous sounding vascular status. Healthy granulation and nice rims of epithelialization around the circumference. 2. No evidence of infection. 3. The patient is changing the [facility] daily on his own. 4. No evidence of infection Electronic Signature(s) Signed: 07/11/2022 4:51:59 PM By: [name] MD Entered By: [name] on 07/11/2022 13:17:15 -------------------------------------------------------------------------------- SuperBill Details Patient  Name: Date of Service: [name] 07/11/2022 Medical Record Number: [id number] Patient Account Number: [id number] Date of Birth/Sex: Treating RN: 12-08-1933 (89 y.o. [name] Primary Care Provider: Franchot Erichsen Other Clinician: Referring Provider: Treating Provider/Extender: Hunt Oris Weeks in Treatment: 1 Diagnosis Coding ICD-10 Codes Code Description [phone] Non-pressure chronic ulcer of other part of right foot with fat layer exposed E11.621 Type 2 diabetes mellitus with foot ulcer I73.9 Peripheral vascular disease, unspecified T81.31XA Disruption of external operation (surgical) wound, not elsewhere classified, initial encounter Facility Procedures : CPT4 Code: [id number] Description: 99213 - WOUND CARE VISIT-LEV 3 EST PT Modifier: 25 Quantity: 1 Physician Procedures : CPT4 Code Description Modifier [id number] - WC PHYS LEVEL 3 - EST PT ICD-10 Diagnosis Description L97.512 Non-pressure chronic ulcer of other part of right foot with fat layer exposed E11.621 Type 2 diabetes mellitus with foot ulcer I73.9 Peripheral  vascular disease, unspecified Quantity: 1 Electronic Signature(s) Signed: 07/11/2022 4:51:59 PM By: [name] MD Entered By: [name] on 07/11/2022 13:17:50

## 2022-08-16 NOTE — Progress Notes (Signed)
OSORTO, Tauno ([id number]) [id number] Page 1 of 8 Visit Report for 07/25/2022 Chief Complaint Document Details Patient Name: Date of Service: [name] 07/25/2022 12:30 PM Medical Record Number: [id number] Patient Account Number: [id number] Date of Birth/Sex: Treating RN: [birth year] (89 y.o. M) Primary Care Provider: Franchot Erichsen Other Clinician: Referring Provider: Treating Provider/Extender: Newman Pies Weeks in Treatment: 3 Information Obtained from: Patient Chief Complaint 07/04/2022; right foot wound Electronic Signature(s) Signed: 07/25/2022 2:02:21 PM By: [name] DO Entered By: [name] on 07/25/2022 13:04:11 -------------------------------------------------------------------------------- Cellular or Tissue Based Product Details Patient Name: Date of Service: [name] 07/25/2022 12:30 PM Medical Record Number: [id number] Patient Account Number: [id number] Date of Birth/Sex: Treating RN: [birth year] (89 y.o. M) Primary Care Provider: Franchot Erichsen Other Clinician: Referring Provider: Treating Provider/Extender: Candace Gallus in Treatment: 3 Cellular or Tissue Based Product Type Wound #3 Right Amputation Site - Toe Applied to: Performed By: Physician [name], DO Cellular or Tissue Based Product Type: Kerecis Omega3 Level of Consciousness (Pre-procedure): Awake and Alert Pre-procedure Verification/Time Out Yes - 12:55 Taken: Location: trunk / arms / legs Wound Size (sq cm): 0.72 Product Size (sq cm): 11 Waste Size (sq cm): 7 Waste Reason: too large for wound size Amount of Product Applied (sq cm): 4 Instrument Used: Curette, Forceps, Scissors Lot #: [id number] Order #: [id number] Expiration Date: 06/05/2024 Fenestrated: No Reconstituted: Yes Solution Type: NS Solution Amount: 5ml Lot #: [id number] Solution Expiration Date:  08/04/2024 Secured: Yes Secured With: Steri-Strips Dressing Applied: Yes Primary Dressing: Adaptic Procedural Pain: 0 Post Procedural Pain: 0 Response to Treatment: Procedure was tolerated well Level of Consciousness (Post- Awake and Alert procedure): Post Procedure Diagnosis Same as Pre-procedure Notes Scribed for Dr Mikey Bussing by Brenton Grills RN [place], [place] ([id number]) [phone].pdf Page 2 of 8 Electronic Signature(s) Signed: 08/03/2022 1:13:14 PM By: Pearletha Alfred Signed: 08/03/2022 2:57:45 PM By: [name] DO Previous Signature: 07/25/2022 2:02:21 PM Version By: [name] DO Entered By: Pearletha Alfred on 08/03/2022 13:13:14 -------------------------------------------------------------------------------- HPI Details Patient Name: Date of Service: [name] 07/25/2022 12:30 PM Medical Record Number: [id number] Patient Account Number: [id number] Date of Birth/Sex: Treating RN: 01-27-1934 (89 y.o. M) Primary Care Provider: Franchot Erichsen Other Clinician: Referring Provider: Treating Provider/Extender: Newman Pies Weeks in Treatment: 3 History of Present Illness HPI Description: 07/04/2022 [name] is an 89 year old male with a past medical history of uncontrolled, insulin dependent type 2 diabetes, CKD stage III, and COPD that presents to the clinic for a right foot wound. He has an extensive history of peripheral arterial disease to the right leg. He has had a right superficial femoral to posterior tibial artery bypass by Dr. Elonda Husky in 2020. He had a right common femoral to plantar posterior tibial artery bypass on 02/03/2022 by Dr. Lenell Antu and at that time he also had a right toe amputation. He states this wound site had healed completely. Prior to this he had amputations to the first and second digit to the right foot. He presents today with an open wound to the previous right toe amputation  site for the past 2 months. He has been following with vein and vascular for this issue. Most recent ABIs were on 06/22/2022 that showed absent waveforms in the right lower extremity. Currently his right lower extremity bypass is occluded and no plan for another anteriorgram and has he likely has no revascularization options. Currently he has been using Betadine to the wound  bed. He denies signs of infection. 07/11/2022 patient arrives in clinic today using Hydrofera Blue on the toe amputation site. The patient is revascularized and is not felt to have any other options for revascularization. We are using Hydrofera Blue and gauze. 5/13; patient presents for follow-up. He has been using Hydrofera Blue to the toe amputation site. He has had no issues. Wound is smaller. He has been approved for [facility] and was agreeable to have this placed in office today. 5/20; patient presents for follow-up. We have been using Kerecis to the wound bed. There is been improvement in healing. Unfortunately patient did not change the outer dressing for a week. There is slight maceration to the periwound. Unclear why he did not do this. Electronic Signature(s) Signed: 07/25/2022 2:02:21 PM By: [name] DO Entered By: [name] on 07/25/2022 13:04:43 -------------------------------------------------------------------------------- Physical Exam Details Patient Name: Date of Service: [name] 07/25/2022 12:30 PM Medical Record Number: [id number] Patient Account Number: [id number] Date of Birth/Sex: Treating RN: Aug 09, 1933 (89 y.o. M) Primary Care Provider: Franchot Erichsen Other Clinician: Referring Provider: Treating Provider/Extender: Newman Pies Weeks in Treatment: 3 Constitutional respirations regular, non-labored and within target range for patient.. Cardiovascular 2+ dorsalis pedis/posterior tibialis pulses. Psychiatric pleasant and cooperative. Notes Third toe  amputation site with healthy granulation tissue, slough accumulation and epithelization occurring to the edges. No signs of infection. Electronic Signature(s) Signed: 07/25/2022 2:02:21 PM By: [name] DO Entered By: [name] on 07/25/2022 13:05:17 [name], [name] ([id number]) [id number] Page 3 of 8 -------------------------------------------------------------------------------- Physician Orders Details Patient Name: Date of Service: [name] 07/25/2022 12:30 PM Medical Record Number: [id number] Patient Account Number: [id number] Date of Birth/Sex: Treating RN: 1934-02-19 (89 y.o. [name] Primary Care Provider: Franchot Erichsen Other Clinician: Referring Provider: Treating Provider/Extender: Newman Pies Weeks in Treatment: 3 Verbal / Phone Orders: No Diagnosis Coding Follow-up Appointments ppointment in 1 week. - w/ Dr. Mikey Bussing and Veneda Melter # 9 Return A Anesthetic (In clinic) Topical Lidocaine 5% applied to wound bed Cellular or Tissue Based Products daptic or Mepitel. (DO NOT REMOVE). - Cellular or Tissue Based Product applied to wound bed, secured with steri-strips, cover with A Kerecis #1 Bathing/ Shower/ Hygiene May shower with protection but do not get wound dressing(s) wet. Protect dressing(s) with water repellant cover (for example, large plastic bag) or a cast cover and may then take shower. Edema Control - Lymphedema / SCD / Other Elevate legs to the level of the heart or above for 30 minutes daily and/or when sitting for 3-4 times a day throughout the day. Avoid standing for long periods of time. Wound Treatment Wound #3 - Amputation Site - Toe Wound Laterality: Right Cleanser: Vashe 5.8 (oz) 1 x Per Day/15 Days Discharge Instructions: Cleanse the wound with Vashe prior to applying a clean dressing using gauze sponges, not tissue or cotton balls. Secondary Dressing: Woven Gauze  Sponge, Non-Sterile 4x4 in (Generic) 1 x Per Day/15 Days Discharge Instructions: Apply over primary dressing as directed. Secured With: 23M Medipore H Soft Cloth Surgical T ape, 4 x 10 (in/yd) (Generic) 1 x Per Day/15 Days Discharge Instructions: Secure with tape as directed. Electronic Signature(s) Signed: 07/25/2022 2:02:21 PM By: [name] DO Signed: 08/16/2022 7:50:28 AM By: Brenton Grills Entered By: Brenton Grills on 07/25/2022 13:15:37 -------------------------------------------------------------------------------- Problem List Details Patient Name: Date of Service: [name] 07/25/2022 12:30 PM Medical Record Number: [id number] Patient Account Number: [id number] Date of Birth/Sex:  Treating RN: November 18, 1933 (89 y.o. M) Primary Care Provider: Franchot Erichsen Other Clinician: Referring Provider: Treating Provider/Extender: Newman Pies Weeks in Treatment: 3 Active Problems ICD-10 Encounter Code Description Active Date MDM Diagnosis L97.512 Non-pressure chronic ulcer of other part of right foot with fat layer exposed 07/04/2022 No Yes E11.621 Type 2 diabetes mellitus with foot ulcer 07/04/2022 No Yes I70.235 Atherosclerosis of native arteries of right leg with ulceration of other part of 07/04/2022 No Yes foot [name], [name] ([id number]) [phone].pdf Page 4 of 8 I73.9 Peripheral vascular disease, unspecified 07/04/2022 No Yes Inactive Problems Resolved Problems Electronic Signature(s) Signed: 07/25/2022 2:02:21 PM By: [name] DO Entered By: [name] on 07/25/2022 13:03:59 -------------------------------------------------------------------------------- Progress Note Details Patient Name: Date of Service: [name] 07/25/2022 12:30 PM Medical Record Number: [id number] Patient Account Number: [id number] Date of Birth/Sex: Treating RN: 1933/06/25 (89 y.o. M) Primary Care Provider: Franchot Erichsen Other Clinician: Referring Provider: Treating Provider/Extender: Newman Pies Weeks in Treatment: 3 Subjective Chief Complaint Information obtained from Patient 07/04/2022; right foot wound History of Present Illness (HPI) 07/04/2022 Mr. Trooper Langrehr is an 89 year old male with a past medical history of uncontrolled, insulin dependent type 2 diabetes, CKD stage III, and COPD that presents to the clinic for a right foot wound. He has an extensive history of peripheral arterial disease to the right leg. He has had a right superficial femoral to posterior tibial artery bypass by Dr. Elonda Husky in 2020. He had a right common femoral to plantar posterior tibial artery bypass on 02/03/2022 by Dr. Lenell Antu and at that time he also had a right toe amputation. He states this wound site had healed completely. Prior to this he had amputations to the first and second digit to the right foot. He presents today with an open wound to the previous right toe amputation site for the past 2 months. He has been following with vein and vascular for this issue. Most recent ABIs were on 06/22/2022 that showed absent waveforms in the right lower extremity. Currently his right lower extremity bypass is occluded and no plan for another anteriorgram and has he likely has no revascularization options. Currently he has been using Betadine to the wound bed. He denies signs of infection. 07/11/2022 patient arrives in clinic today using Hydrofera Blue on the toe amputation site. The patient is revascularized and is not felt to have any other options for revascularization. We are using Hydrofera Blue and gauze. 5/13; patient presents for follow-up. He has been using Hydrofera Blue to the toe amputation site. He has had no issues. Wound is smaller. He has been approved for [facility] and was agreeable to have this placed in office today. 5/20; patient presents for follow-up. We have been using Kerecis to  the wound bed. There is been improvement in healing. Unfortunately patient did not change the outer dressing for a week. There is slight maceration to the periwound. Unclear why he did not do this. Patient History Information obtained from Patient. Family History Cancer - Siblings, Diabetes - Siblings,Child, Heart Disease - Siblings, Hypertension - Father,Siblings, Lung Disease - Siblings, Stroke - Siblings, No family history of Hereditary Spherocytosis, Kidney Disease, Seizures, Thyroid Problems, Tuberculosis. Social History Former smoker - quit 10 yrs ago, Marital Status - Married, Alcohol Use - Never, Drug Use - No History, Caffeine Use - Daily - coffee. Medical History Eyes Patient has history of Cataracts - removed both eyes Denies history of Glaucoma, Optic Neuritis Ear/Nose/Mouth/Throat Denies history of Chronic  sinus problems/congestion, Middle ear problems Hematologic/Lymphatic Patient has history of Anemia Denies history of Hemophilia, Human Immunodeficiency Virus, Lymphedema, Sickle Cell Disease Respiratory Patient has history of Asthma, Chronic Obstructive Pulmonary Disease (COPD) Denies history of Aspiration, Pneumothorax, Sleep Apnea, Tuberculosis Cardiovascular Patient has history of Peripheral Arterial Disease Endocrine Patient has history of Type II Diabetes - prediabetes Denies history of Type I Diabetes Genitourinary [name], [name] ([id number]) [id number] Page 5 of 8 Denies history of End Stage Renal Disease Integumentary (Skin) Denies history of History of Burn Musculoskeletal Patient has history of Gout, Osteoarthritis Denies history of Rheumatoid Arthritis, Osteomyelitis Neurologic Patient has history of Neuropathy Oncologic Denies history of Received Chemotherapy, Received Radiation Psychiatric Denies history of Anorexia/bulimia, Confinement Anxiety Hospitalization/Surgery History - cholecystectomy. Medical A Surgical  History Notes nd Ear/Nose/Mouth/Throat hard of hearing Objective Constitutional respirations regular, non-labored and within target range for patient.. Vitals Time Taken: 12:36 PM, Temperature: 98.2 F, Pulse: 90 bpm, Respiratory Rate: 18 breaths/min, Blood Pressure: 107/70 mmHg, Capillary Blood Glucose: 140 mg/dl. Cardiovascular 2+ dorsalis pedis/posterior tibialis pulses. Psychiatric pleasant and cooperative. General Notes: Third toe amputation site with healthy granulation tissue, slough accumulation and epithelization occurring to the edges. No signs of infection. Integumentary (Hair, Skin) Wound #3 status is Open. Original cause of wound was Gradually Appeared. The date acquired was: 04/07/2022. The wound has been in treatment 3 weeks. The wound is located on the Right Amputation Site - T The wound measures 1.2cm length x 0.6cm width x 0.1cm depth; 0.565cm^2 area and 0.057cm^3 volume. oe. There is Fat Layer (Subcutaneous Tissue) exposed. There is no tunneling or undermining noted. There is a medium amount of serosanguineous drainage noted. The wound margin is epibole. There is large (67-100%) red, pink granulation within the wound bed. There is a small (1-33%) amount of necrotic tissue within the wound bed including Adherent Slough. The periwound skin appearance exhibited: Maceration. The periwound skin appearance did not exhibit: Callus, Crepitus, Excoriation, Induration, Rash, Scarring, Dry/Scaly, Atrophie Blanche, Cyanosis, Ecchymosis, Hemosiderin Staining, Mottled, Pallor, Rubor, Erythema. Periwound temperature was noted as No Abnormality. Assessment Active Problems ICD-10 Non-pressure chronic ulcer of other part of right foot with fat layer exposed Type 2 diabetes mellitus with foot ulcer Atherosclerosis of native arteries of right leg with ulceration of other part of foot Peripheral vascular disease, unspecified Patient's wound has shown improvement in size and appearance since  last clinic visit. I debrided slough accumulation. Kerecis was placed in standard fashion. Last week I had instructed that he change the outer dressing daily. Unfortunately patient forgot to do this. I recommended based on maceration and drainage that he at least change the outer dressing every other day. Plan Follow-up Appointments: Return Appointment in 1 week. - w/ Dr. Mikey Bussing and Veneda Melter # 9 Anesthetic: (In clinic) Topical Lidocaine 5% applied to wound bed Cellular or Tissue Based Products: Cellular or Tissue Based Product applied to wound bed, secured with steri-strips, cover with Adaptic or Mepitel. (DO NOT REMOVE). - Kerecis #1 Bathing/ Shower/ Hygiene: May shower with protection but do not get wound dressing(s) wet. Protect dressing(s) with water repellant cover (for example, large plastic bag) or a cast cover [name], [name] ([id number]) [phone].pdf Page 6 of 8 and may then take shower. Edema Control - Lymphedema / SCD / Other: Elevate legs to the level of the heart or above for 30 minutes daily and/or when sitting for 3-4 times a day throughout the day. Avoid standing for long periods of time. WOUND #3: - Amputation Site - T oe  Wound Laterality: Right Cleanser: Vashe 5.8 (oz) 1 x Per Day/15 Days Discharge Instructions: Cleanse the wound with Vashe prior to applying a clean dressing using gauze sponges, not tissue or cotton balls. Secondary Dressing: Woven Gauze Sponge, Non-Sterile 4x4 in (Generic) 1 x Per Day/15 Days Discharge Instructions: Apply over primary dressing as directed. Secured With: 91M Medipore H Soft Cloth Surgical T ape, 4 x 10 (in/yd) (Generic) 1 x Per Day/15 Days Discharge Instructions: Secure with tape as directed. 1. In office sharp debridement 2. Kerecis placed in standard fashion 3. Follow-up in 1 week Electronic Signature(s) Signed: 07/27/2022 6:02:30 PM By: Shawn Stall RN, BSN Signed: 07/28/2022 12:56:08 PM By: [name] DO Previous Signature: 07/25/2022 2:02:21 PM Version By: [name] DO Entered By: Shawn Stall on 07/27/2022 17:59:01 -------------------------------------------------------------------------------- HxROS Details Patient Name: Date of Service: [name] 07/25/2022 12:30 PM Medical Record Number: [id number] Patient Account Number: [id number] Date of Birth/Sex: Treating RN: 1933/04/20 (89 y.o. M) Primary Care Provider: Franchot Erichsen Other Clinician: Referring Provider: Treating Provider/Extender: Newman Pies Weeks in Treatment: 3 Information Obtained From Patient Eyes Medical History: Positive for: Cataracts - removed both eyes Negative for: Glaucoma; Optic Neuritis Ear/Nose/Mouth/Throat Medical History: Negative for: Chronic sinus problems/congestion; Middle ear problems Past Medical History Notes: hard of hearing Hematologic/Lymphatic Medical History: Positive for: Anemia Negative for: Hemophilia; Human Immunodeficiency Virus; Lymphedema; Sickle Cell Disease Respiratory Medical History: Positive for: Asthma; Chronic Obstructive Pulmonary Disease (COPD) Negative for: Aspiration; Pneumothorax; Sleep Apnea; Tuberculosis Cardiovascular Medical History: Positive for: Peripheral Arterial Disease Endocrine Medical History: Positive for: Type II Diabetes - prediabetes Negative for: Type I Diabetes Time with diabetes: 3 yrs ago Treated with: Diet Blood sugar tested every day: No [name], [name] ([id number]) [phone].pdf Page 7 of 8 Genitourinary Medical History: Negative for: End Stage Renal Disease Integumentary (Skin) Medical History: Negative for: History of Burn Musculoskeletal Medical History: Positive for: Gout; Osteoarthritis Negative for: Rheumatoid Arthritis; Osteomyelitis Neurologic Medical History: Positive for: Neuropathy Oncologic Medical History: Negative for: Received  Chemotherapy; Received Radiation Psychiatric Medical History: Negative for: Anorexia/bulimia; Confinement Anxiety HBO Extended History Items Eyes: Cataracts Immunizations Pneumococcal Vaccine: Received Pneumococcal Vaccination: Yes Received Pneumococcal Vaccination On or After 60th Birthday: Yes Implantable Devices None Hospitalization / Surgery History Type of Hospitalization/Surgery cholecystectomy Family and Social History Cancer: Yes - Siblings; Diabetes: Yes - Siblings,Child; Heart Disease: Yes - Siblings; Hereditary Spherocytosis: No; Hypertension: Yes - Father,Siblings; Kidney Disease: No; Lung Disease: Yes - Siblings; Seizures: No; Stroke: Yes - Siblings; Thyroid Problems: No; Tuberculosis: No; Former smoker - quit 10 yrs ago; Marital Status - Married; Alcohol Use: Never; Drug Use: No History; Caffeine Use: Daily - coffee; Financial Concerns: No; Food, Clothing or Shelter Needs: No; Support System Lacking: No; Transportation Concerns: No Electronic Signature(s) Signed: 07/25/2022 2:02:21 PM By: [name] DO Entered By: [name] on 07/25/2022 13:04:49 -------------------------------------------------------------------------------- SuperBill Details Patient Name: Date of Service: [name] 07/25/2022 Medical Record Number: [id number] Patient Account Number: [id number] Date of Birth/Sex: Treating RN: Aug 05, 1933 (89 y.o. M) Primary Care Provider: Franchot Erichsen Other Clinician: Referring Provider: Treating Provider/Extender: Newman Pies Weeks in Treatment: 3 Diagnosis Coding ICD-10 Codes Code Description [phone] Non-pressure chronic ulcer of other part of right foot with fat layer exposed [name], [name] ([id number]) [phone].pdf Page 8 of 8 E11.621 Type 2 diabetes mellitus with foot ulcer I70.235 Atherosclerosis of native arteries of right leg with ulceration of other part of foot I73.9  Peripheral vascular disease, unspecified Facility Procedures :  CPT4 Code: [id number] Description: 15271 - SKIN SUB GRAFT TRNK/ARM/LEG ICD-10 Diagnosis Description L97.512 Non-pressure chronic ulcer of other part of right foot with fat layer exposed E11.621 Type 2 diabetes mellitus with foot ulcer I70.235 Atherosclerosis of native  arteries of right leg with ulceration of other part of fo I73.9 Peripheral vascular disease, unspecified Modifier: ot Quantity: 1 : CPT4 Code: [id number] Description: Q4158 Kerecis Omega Marigen 3 x 3.75 qty 11 ICD-10 Diagnosis Description L97.512 Non-pressure chronic ulcer of other part of right foot with fat layer exposed E11.621 Type 2 diabetes mellitus with foot ulcer I70.235 Atherosclerosis of native  arteries of right leg with ulceration of other part of fo I73.9 Peripheral vascular disease, unspecified Modifier: ot Quantity: 11 Physician Procedures : CPT4 Code Description Modifier [id number] 15271 - WC PHYS SKIN SUB GRAFT TRNK/ARM/LEG ICD-10 Diagnosis Description L97.512 Non-pressure chronic ulcer of other part of right foot with fat layer exposed E11.621 Type 2 diabetes mellitus with foot ulcer  I70.235 Atherosclerosis of native arteries of right leg with ulceration of other part of foot I73.9 Peripheral vascular disease, unspecified Quantity: 1 Electronic Signature(s) Signed: 08/03/2022 1:14:46 PM By: Pearletha Alfred Signed: 08/03/2022 2:57:45 PM By: [name] DO Previous Signature: 07/25/2022 2:02:21 PM Version By: [name] DO Entered By: Pearletha Alfred on 08/03/2022 13:14:45

## 2022-08-16 NOTE — Progress Notes (Signed)
WALLMAN, Braycen ([id number]) [id number] Page 1 of 6 Visit Report for 07/25/2022 Arrival Information Details Patient Name: Date of Service: [name] 07/25/2022 12:30 PM Medical Record Number: [id number] Patient Account Number: [id number] Date of Birth/Sex: Treating RN: 1933-11-13 (89 y.o. [name] Primary Care [name]: [name] Other Clinician: Referring [name]: Treating [name]/Extender: [name] Weeks in Treatment: 3 Visit Information History Since Last Visit All ordered tests and consults were completed: Yes Patient Arrived: Ambulatory Added or deleted any medications: No Arrival Time: 12:35 Any new allergies or adverse reactions: No Accompanied By: spouse Had a fall or experienced change in No Transfer Assistance: None activities of daily living that may affect Patient Identification Verified: Yes risk of falls: Secondary Verification Process Completed: Yes Signs or symptoms of abuse/neglect since last visito No Patient Requires Transmission-Based Precautions: No Hospitalized since last visit: No Patient Has Alerts: Yes Implantable device outside of the clinic excluding No Patient Alerts: Patient on Blood Thinner cellular tissue based products placed in the center since last visit: Has Dressing in Place as Prescribed: Yes Pain Present Now: No Electronic Signature(s) Signed: 08/16/2022 7:50:28 AM By: [name] Entered By: [name] on 07/25/2022 12:36:21 -------------------------------------------------------------------------------- Encounter Discharge Information Details Patient Name: Date of Service: [name] 07/25/2022 12:30 PM Medical Record Number: [id number] Patient Account Number: [id number] Date of Birth/Sex: Treating RN: 1933-11-10 (89 y.o. [name] Primary Care [name]: [name] Other Clinician: Referring Amerie Beaumont: Treating  [name]/Extender: [name] Weeks in Treatment: 3 Encounter Discharge Information Items Post Procedure Vitals Discharge Condition: Stable Temperature (F): 98.2 Ambulatory Status: Ambulatory Pulse (bpm): 82 Discharge Destination: Home Respiratory Rate (breaths/min): 20 Transportation: Private Auto Blood Pressure (mmHg): 138/72 Accompanied By: spouse Schedule Follow-up Appointment: Yes Clinical Summary of Care: Patient Declined Electronic Signature(s) Signed: 08/16/2022 7:50:28 AM By: [name] Entered By: [name] on 07/25/2022 13:18:36 -------------------------------------------------------------------------------- Lower Extremity Assessment Details Patient Name: Date of Service: [name] 07/25/2022 12:30 PM Medical Record Number: [id number] Patient Account Number: [id number] Date of Birth/Sex: Treating RN: 1933-12-15 (89 y.o. [name] Primary Care [name]: [name] Other Clinician: Referring [name]: Treating [name]/Extender: [name] Weeks in Treatment: 3 Edema Assessment R[Left: [name] ([id number])] Franne Forts: [id number] Page 2 of 6] Assessed: [Left: No] [Right: No] Edema: [Left: N] [Right: o] Calf Left: Right: Point of Measurement: From Medial Instep 32.7 cm Ankle Left: Right: Point of Measurement: From Medial Instep 26 cm Vascular Assessment Pulses: Dorsalis Pedis Palpable: [Right:Yes] Electronic Signature(s) Signed: 08/16/2022 7:50:28 AM By: [name] Entered By: [name] on 07/25/2022 12:45:46 -------------------------------------------------------------------------------- Multi Wound Chart Details Patient Name: Date of Service: [name] 07/25/2022 12:30 PM Medical Record Number: [id number] Patient Account Number: [id number] Date of Birth/Sex: Treating RN: Apr 01, 1933 (89 y.o. M) Primary Care [name]: [name] Other Clinician: Referring [name]: Treating [name]/Extender: [name] Weeks in Treatment: 3 Vital Signs Height(in): Capillary Blood Glucose(mg/dl): 416 Weight(lbs): Pulse(bpm): 90 Body Mass Index(BMI): Blood Pressure(mmHg): 107/70 Temperature(F): 98.2 Respiratory Rate(breaths/min): 18 [3:Photos:] [N/A:N/A] Right Amputation Site - Toe N/A N/A Wound Location: Gradually Appeared N/A N/A Wounding Event: Diabetic Wound/Ulcer of the Lower N/A N/A Primary Etiology: Extremity Cataracts, Anemia, Asthma, Chronic N/A N/A Comorbid History: Obstructive Pulmonary Disease (COPD), Peripheral Arterial Disease, Type II Diabetes, Gout, Osteoarthritis, Neuropathy 04/07/2022 N/A N/A Date Acquired: 3 N/A N/A Weeks of Treatment: Open N/A N/A Wound Status: No N/A N/A Wound Recurrence: 1.2x0.6x0.1 N/A N/A Measurements L x  W x D (cm) 0.565 N/A N/A A (cm) : rea 0.057 N/A N/A Volume (cm) : 82.20% N/A N/A % Reduction in A rea: 94.00% N/A N/A % Reduction in Volume: Grade 1 N/A N/A Classification: Medium N/A N/A Exudate A mount: Serosanguineous N/A N/A Exudate Type: red, brown N/A N/A Exudate Color: Epibole N/A N/A Wound Margin: Large (67-100%) N/A N/A Granulation A mountElazar [name], [name] ([id number]) [id number] Page 3 of 6 Red, Pink N/A N/A Granulation Quality: Small (1-33%) N/A N/A Necrotic Amount: Fat Layer (Subcutaneous Tissue): Yes N/A N/A Exposed Structures: Fascia: No Tendon: No Muscle: No Joint: No Bone: No Small (1-33%) N/A N/A Epithelialization: Excoriation: No N/A N/A Periwound Skin Texture: Induration: No Callus: No Crepitus: No Rash: No Scarring: No Maceration: Yes N/A N/A Periwound Skin Moisture: Dry/Scaly: No Atrophie Blanche: No N/A N/A Periwound Skin Color: Cyanosis: No Ecchymosis: No Erythema: No Hemosiderin Staining: No Mottled: No Pallor: No Rubor: No No Abnormality N/A  N/A Temperature: Treatment Notes Electronic Signature(s) Signed: 07/25/2022 2:02:21 PM By: [name] Corwin DO Entered By: [name] on 07/25/2022 13:04:04 -------------------------------------------------------------------------------- Multi-Disciplinary Care Plan Details Patient Name: Date of Service: [name] 07/25/2022 12:30 PM Medical Record Number: [id number] Patient Account Number: [id number] Date of Birth/Sex: Treating RN: Feb 22, 1934 (89 y.o. [name] Primary Care Allora Bains: [name] Other Clinician: Referring Hayzel Ruberg: Treating Shantanu Strauch/Extender: [name] Weeks in Treatment: 3 Active Inactive Orientation to the Wound Care Program Nursing Diagnoses: Knowledge deficit related to the wound healing center program Goals: Patient/caregiver will verbalize understanding of the Wound Healing Center Program Date Initiated: 07/04/2022 Target Resolution Date: 07/14/2022 Goal Status: Active Interventions: Provide education on orientation to the wound center Notes: Wound/Skin Impairment Nursing Diagnoses: Impaired tissue integrity Knowledge deficit related to ulceration/compromised skin integrity Goals: Patient will have a decrease in wound volume by X% from date: (specify in notes) Date Initiated: 07/04/2022 Target Resolution Date: 07/15/2022 Goal Status: Active Patient/caregiver will verbalize understanding of skin care regimen Date Initiated: 07/04/2022 Target Resolution Date: 07/16/2022 Goal Status: Active Ulcer/skin breakdown will have a volume reduction of 30% by week 4 Date Initiated: 07/04/2022 Target Resolution Date: 07/16/2022 [name], [name] ([id number]) [id number] Page 4 of 6 Goal Status: Active Ulcer/skin breakdown will have a volume reduction of 50% by week 8 Date Initiated: 07/04/2022 Target Resolution Date: 07/16/2022 Goal Status: Active Interventions: Assess patient/caregiver  ability to obtain necessary supplies Assess patient/caregiver ability to perform ulcer/skin care regimen upon admission and as needed Assess ulceration(s) every visit Notes: Electronic Signature(s) Signed: 08/16/2022 7:50:28 AM By: [name] Entered By: [name] on 07/25/2022 12:48:19 -------------------------------------------------------------------------------- Pain Assessment Details Patient Name: Date of Service: [name] 07/25/2022 12:30 PM Medical Record Number: [id number] Patient Account Number: [id number] Date of Birth/Sex: Treating RN: 1933-03-09 (89 y.o. [name] Primary Care Keyonte Cookston: [name] Other Clinician: Referring Kiyona Mcnall: Treating Elvis Boot/Extender: [name] Weeks in Treatment: 3 Active Problems Location of Pain Severity and Description of Pain Patient Has Paino No Site Locations Pain Management and Medication Current Pain Management: Electronic Signature(s) Signed: 08/16/2022 7:50:28 AM By: [name] Entered By: [name] on 07/25/2022 12:38:28 -------------------------------------------------------------------------------- Patient/Caregiver Education Details Patient Name: Date of Service: [name] 5/20/2024andnbsp12:30 PM Medical Record Number: [id number] Patient Account Number: [id number] Date of Birth/Gender: Treating RN: Oct 18, 1933 (89 y.o. [name] Primary Care Physician: [name] Other Clinician: Referring Physician: Treating Physician/Extender: Candace Gallus in Treatment: 3 Education Assessment [name], [name] ([id number]) [id number] Page 5 of  6 Education Provided To: Patient and Caregiver Education Topics Provided Wound/Skin Impairment: Methods: Explain/Verbal Responses: State content correctly Electronic Signature(s) Signed: 08/16/2022 7:50:28 AM By: [name] Entered By: [name] on 07/25/2022 12:48:37 -------------------------------------------------------------------------------- Wound Assessment Details Patient Name: Date of Service: [name] 07/25/2022 12:30 PM Medical Record Number: [id number] Patient Account Number: [id number] Date of Birth/Sex: Treating RN: 02/07/1934 (89 y.o. [name] Primary Care Brithney Bensen: [name] Other Clinician: Referring Dejaun Vidrio: Treating Overton Boggus/Extender: [name] Weeks in Treatment: 3 Wound Status Wound Number: 3 Primary Diabetic Wound/Ulcer of the Lower Extremity Etiology: Wound Location: Right Amputation Site - Toe Wound Open Wounding Event: Gradually Appeared Status: Date Acquired: 04/07/2022 Comorbid Cataracts, Anemia, Asthma, Chronic Obstructive Pulmonary Weeks Of Treatment: 3 History: Disease (COPD), Peripheral Arterial Disease, Type II Diabetes, Clustered Wound: No Gout, Osteoarthritis, Neuropathy Photos Wound Measurements Length: (cm) 1.2 Width: (cm) 0.6 Depth: (cm) 0.1 Area: (cm) 0.565 Volume: (cm) 0.057 % Reduction in Area: 82.2% % Reduction in Volume: 94% Epithelialization: Small (1-33%) Tunneling: No Undermining: No Wound Description Classification: Grade 1 Wound Margin: Epibole Exudate Amount: Medium Exudate Type: Serosanguineous Exudate Color: red, brown Foul Odor After Cleansing: No Slough/Fibrino Yes Wound Bed Granulation Amount: Large (67-100%) Exposed Structure Granulation Quality: Red, Pink Fascia Exposed: No Necrotic Amount: Small (1-33%) Fat Layer (Subcutaneous Tissue) Exposed: Yes Necrotic Quality: Adherent Slough Tendon Exposed: No Muscle Exposed: No Joint Exposed: No Bone Exposed: No Periwound Skin Texture [name], [name] ([id number]) [id number] Page 6 of 6 Texture Color No Abnormalities Noted: No No Abnormalities Noted: No Callus: No Atrophie Blanche: No Crepitus: No Cyanosis:  No Excoriation: No Ecchymosis: No Induration: No Erythema: No Rash: No Hemosiderin Staining: No Scarring: No Mottled: No Pallor: No Moisture Rubor: No No Abnormalities Noted: No Dry / Scaly: No Temperature / Pain Maceration: Yes Temperature: No Abnormality Electronic Signature(s) Signed: 08/16/2022 7:50:28 AM By: [name] Entered By: [name] on 07/25/2022 12:51:27 -------------------------------------------------------------------------------- Vitals Details Patient Name: Date of Service: [name] 07/25/2022 12:30 PM Medical Record Number: [id number] Patient Account Number: [id number] Date of Birth/Sex: Treating RN: 07/10/1933 (89 y.o. [name] Primary Care Nikeria Kalman: [name] Other Clinician: Referring Mikki Ziff: Treating Daylene Vandenbosch/Extender: [name] Weeks in Treatment: 3 Vital Signs Time Taken: 12:36 Temperature (F): 98.2 Pulse (bpm): 90 Respiratory Rate (breaths/min): 18 Blood Pressure (mmHg): 107/70 Capillary Blood Glucose (mg/dl): 638 Reference Range: 80 - 120 mg / dl Electronic Signature(s) Signed: 08/16/2022 7:50:28 AM By: [name] Entered By: [name] on 07/25/2022 12:38:14

## 2022-08-16 NOTE — Progress Notes (Signed)
DOWNS, Craven ([id number]) [id number] Page 1 of 8 Visit Report for 07/18/2022 Chief Complaint Document Details Patient Name: Date of Service: [name] 07/18/2022 2:45 PM Medical Record Number: [id number] Patient Account Number: [id number] Date of Birth/Sex: Treating RN: 05-10-1933 (89 y.o. [name] Primary Care Provider: Franchot Erichsen Other Clinician: Haywood Pao Referring Provider: Treating Provider/Extender: Newman Pies Weeks in Treatment: 2 Information Obtained from: Patient Chief Complaint 07/04/2022; right foot wound Electronic Signature(s) Signed: 07/18/2022 4:07:16 PM By: [name] DO Entered By: [name] on 07/18/2022 15:35:59 -------------------------------------------------------------------------------- Cellular or Tissue Based Product Details Patient Name: Date of Service: [name] 07/18/2022 2:45 PM Medical Record Number: [id number] Patient Account Number: [id number] Date of Birth/Sex: Treating RN: 12/10/1933 (89 y.o. [name] Primary Care Provider: Franchot Erichsen Other Clinician: Haywood Pao Referring Provider: Treating Provider/Extender: Candace Gallus in Treatment: 2 Cellular or Tissue Based Product Type Wound #3 Right Amputation Site - Toe Applied to: Performed By: Physician [name], DO Cellular or Tissue Based Product Type: Kerecis Omega3 Level of Consciousness (Pre-procedure): Awake and Alert Pre-procedure Verification/Time Out Yes - 15:11 Taken: Location: trunk / arms / legs Wound Size (sq cm): 1.33 Product Size (sq cm): 11 Waste Size (sq cm): 5.25 Waste Reason: wound size Amount of Product Applied (sq cm): 5.75 Instrument Used: Curette, Forceps, Scissors Lot #: [id number] Order #: 1 Expiration Date: 06/05/2024 Fenestrated: No Reconstituted: Yes Solution Type: normal saline Solution Amount:  3mL Lot #: [id number] Solution Expiration Date: 08/04/2024 Secured: Yes Secured With: Steri-Strips Dressing Applied: Yes Primary Dressing: adaptic, gauze Procedural Pain: 0 Post Procedural Pain: 0 Response to Treatment: Procedure was tolerated well Level of Consciousness (Post- Awake and Alert procedure): Post Procedure Diagnosis Same as Pre-procedure Electronic Signature(s) Signed: 08/03/2022 11:13:20 AM By: Towanda Malkin, Laurence ([id number]) [id number] Page 2 of 8 Signed: 08/03/2022 2:57:45 PM By: [name] DO Previous Signature: 07/18/2022 4:07:16 PM Version By: [name] DO Previous Signature: 07/21/2022 3:01:52 PM Version By: Haywood Pao CHT EMT BS , , Entered By: Pearletha Alfred on 08/03/2022 11:13:19 -------------------------------------------------------------------------------- HPI Details Patient Name: Date of Service: [name] 07/18/2022 2:45 PM Medical Record Number: [id number] Patient Account Number: [id number] Date of Birth/Sex: Treating RN: 08-08-1933 (89 y.o. [name] Primary Care Provider: Franchot Erichsen Other Clinician: Haywood Pao Referring Provider: Treating Provider/Extender: Newman Pies Weeks in Treatment: 2 History of Present Illness HPI Description: 07/04/2022 [name] is an 89 year old male with a past medical history of uncontrolled, insulin dependent type 2 diabetes, CKD stage III, and COPD that presents to the clinic for a right foot wound. He has an extensive history of peripheral arterial disease to the right leg. He has had a right superficial femoral to posterior tibial artery bypass by Dr. Elonda Husky in 2020. He had a right common femoral to plantar posterior tibial artery bypass on 02/03/2022 by Dr. Lenell Antu and at that time he also had a right toe amputation. He states this wound site had healed completely. Prior to this he had amputations  to the first and second digit to the right foot. He presents today with an open wound to the previous right toe amputation site for the past 2 months. He has been following with vein and vascular for this issue. Most recent ABIs were on 06/22/2022 that showed absent waveforms in the right lower extremity. Currently his right lower extremity bypass is occluded and no plan for another anteriorgram and  has he likely has no revascularization options. Currently he has been using Betadine to the wound bed. He denies signs of infection. 07/11/2022 patient arrives in clinic today using Hydrofera Blue on the toe amputation site. The patient is revascularized and is not felt to have any other options for revascularization. We are using Hydrofera Blue and gauze. 5/13; patient presents for follow-up. He has been using Hydrofera Blue to the toe amputation site. He has had no issues. Wound is smaller. He has been approved for [facility] and was agreeable to have this placed in office today. Electronic Signature(s) Signed: 07/18/2022 4:07:16 PM By: [name] DO Entered By: [name] on 07/18/2022 15:36:31 -------------------------------------------------------------------------------- Physical Exam Details Patient Name: Date of Service: [name] 07/18/2022 2:45 PM Medical Record Number: [id number] Patient Account Number: [id number] Date of Birth/Sex: Treating RN: [birth year] (89 y.o. [name] Primary Care Provider: Franchot Erichsen Other Clinician: Haywood Pao Referring Provider: Treating Provider/Extender: Newman Pies Weeks in Treatment: 2 Constitutional respirations regular, non-labored and within target range for patient.. Cardiovascular 2+ dorsalis pedis/posterior tibialis pulses. Psychiatric pleasant and cooperative. Notes Third toe amputation site with healthy granulation tissue and epithelization occurring to the edges. No signs of  infection. Electronic Signature(s) Signed: 07/18/2022 4:07:16 PM By: [name] DO Entered By: [name] on 07/18/2022 15:37:15 -------------------------------------------------------------------------------- Physician Orders Details Patient Name: Date of Service: [name] 07/18/2022 2:45 PM Medical Record Number: [id number] Patient Account Number: [id number] Date of Birth/Sex: Treating RN: [birth year] (89 y.o. Delilah Shan, [place] ([id number]) [phone].pdf Page 3 of 8 Primary Care Provider: Franchot Erichsen Other Clinician: Haywood Pao Referring Provider: Treating Provider/Extender: Newman Pies Weeks in Treatment: 2 Verbal / Phone Orders: No Diagnosis Coding Follow-up Appointments ppointment in 1 week. - w/ Dr. Mikey Bussing and Veneda Melter # 9 Return A Anesthetic (In clinic) Topical Lidocaine 5% applied to wound bed Bathing/ Shower/ Hygiene May shower with protection but do not get wound dressing(s) wet. Protect dressing(s) with water repellant cover (for example, large plastic bag) or a cast cover and may then take shower. Edema Control - Lymphedema / SCD / Other Elevate legs to the level of the heart or above for 30 minutes daily and/or when sitting for 3-4 times a day throughout the day. Avoid standing for long periods of time. Wound Treatment Wound #3 - Amputation Site - Toe Wound Laterality: Right Cleanser: Vashe 5.8 (oz) 1 x Per Day/15 Days Discharge Instructions: Cleanse the wound with Vashe prior to applying a clean dressing using gauze sponges, not tissue or cotton balls. Prim Dressing: Hydrofera Blue Ready Transfer Foam, 4x5 (in/in) (Generic) 1 x Per Day/15 Days ary Discharge Instructions: Apply to wound bed as instructed Secondary Dressing: Woven Gauze Sponge, Non-Sterile 4x4 in (Generic) 1 x Per Day/15 Days Discharge Instructions: Apply over primary dressing as directed. Secured  With: 62M Medipore H Soft Cloth Surgical T ape, 4 x 10 (in/yd) (Generic) 1 x Per Day/15 Days Discharge Instructions: Secure with tape as directed. Electronic Signature(s) Signed: 07/18/2022 4:07:16 PM By: [name] DO Entered By: [name] on 07/18/2022 15:37:23 -------------------------------------------------------------------------------- Problem List Details Patient Name: Date of Service: [name] 07/18/2022 2:45 PM Medical Record Number: [id number] Patient Account Number: [id number] Date of Birth/Sex: Treating RN: June 17, 1933 (89 y.o. [name] Primary Care Provider: Franchot Erichsen Other Clinician: Haywood Pao Referring Provider: Treating Provider/Extender: Newman Pies Weeks in Treatment: 2 Active Problems ICD-10 Encounter Code Description Active Date  MDM Diagnosis L97.512 Non-pressure chronic ulcer of other part of right foot with fat layer exposed 07/04/2022 No Yes E11.621 Type 2 diabetes mellitus with foot ulcer 07/04/2022 No Yes I70.235 Atherosclerosis of native arteries of right leg with ulceration of other part of 07/04/2022 No Yes foot I73.9 Peripheral vascular disease, unspecified 07/04/2022 No Yes [name], Loris ([id number]) [id number] Page 4 of 8 Inactive Problems Resolved Problems Electronic Signature(s) Signed: 07/18/2022 4:07:16 PM By: [name] DO Entered By: [name] on 07/18/2022 15:35:46 -------------------------------------------------------------------------------- Progress Note Details Patient Name: Date of Service: [name] 07/18/2022 2:45 PM Medical Record Number: [id number] Patient Account Number: [id number] Date of Birth/Sex: Treating RN: Dec 17, 1933 (89 y.o. [name] Primary Care Provider: Franchot Erichsen Other Clinician: Haywood Pao Referring Provider: Treating Provider/Extender: Newman Pies Weeks in Treatment: 2 Subjective Chief Complaint Information obtained from Patient 07/04/2022; right foot wound History of Present Illness (HPI) 07/04/2022 [name] is an 89 year old male with a past medical history of uncontrolled, insulin dependent type 2 diabetes, CKD stage III, and COPD that presents to the clinic for a right foot wound. He has an extensive history of peripheral arterial disease to the right leg. He has had a right superficial femoral to posterior tibial artery bypass by Dr. Elonda Husky in 2020. He had a right common femoral to plantar posterior tibial artery bypass on 02/03/2022 by Dr. Lenell Antu and at that time he also had a right toe amputation. He states this wound site had healed completely. Prior to this he had amputations to the first and second digit to the right foot. He presents today with an open wound to the previous right toe amputation site for the past 2 months. He has been following with vein and vascular for this issue. Most recent ABIs were on 06/22/2022 that showed absent waveforms in the right lower extremity. Currently his right lower extremity bypass is occluded and no plan for another anteriorgram and has he likely has no revascularization options. Currently he has been using Betadine to the wound bed. He denies signs of infection. 07/11/2022 patient arrives in clinic today using Hydrofera Blue on the toe amputation site. The patient is revascularized and is not felt to have any other options for revascularization. We are using Hydrofera Blue and gauze. 5/13; patient presents for follow-up. He has been using Hydrofera Blue to the toe amputation site. He has had no issues. Wound is smaller. He has been approved for [facility] and was agreeable to have this placed in office today. Patient History Information obtained from Patient. Family History Cancer - Siblings, Diabetes - Siblings,Child, Heart Disease - Siblings, Hypertension -  Father,Siblings, Lung Disease - Siblings, Stroke - Siblings, No family history of Hereditary Spherocytosis, Kidney Disease, Seizures, Thyroid Problems, Tuberculosis. Social History Former smoker - quit 10 yrs ago, Marital Status - Married, Alcohol Use - Never, Drug Use - No History, Caffeine Use - Daily - coffee. Medical History Eyes Patient has history of Cataracts - removed both eyes Denies history of Glaucoma, Optic Neuritis Ear/Nose/Mouth/Throat Denies history of Chronic sinus problems/congestion, Middle ear problems Hematologic/Lymphatic Patient has history of Anemia Denies history of Hemophilia, Human Immunodeficiency Virus, Lymphedema, Sickle Cell Disease Respiratory Patient has history of Asthma, Chronic Obstructive Pulmonary Disease (COPD) Denies history of Aspiration, Pneumothorax, Sleep Apnea, Tuberculosis Cardiovascular Patient has history of Peripheral Arterial Disease Endocrine Patient has history of Type II Diabetes - prediabetes Denies history of Type I Diabetes Genitourinary Denies history of End Stage Renal Disease Integumentary (  Skin) Denies history of History of Burn Musculoskeletal Patient has history of Gout, Osteoarthritis Denies history of Rheumatoid Arthritis, Osteomyelitis Neurologic Patient has history of Neuropathy Oncologic Denies history of Received Chemotherapy, Received Radiation Psychiatric [name], [name] ([id number]) [id number] Page 5 of 8 Denies history of Anorexia/bulimia, Confinement Anxiety Hospitalization/Surgery History - cholecystectomy. Medical A Surgical History Notes nd Ear/Nose/Mouth/Throat hard of hearing Objective Constitutional respirations regular, non-labored and within target range for patient.. Vitals Time Taken: 2:27 PM, Temperature: 97.9 F, Pulse: 78 bpm, Respiratory Rate: 18 breaths/min, Blood Pressure: 141/71 mmHg, Capillary Blood Glucose: 119 mg/dl. Cardiovascular 2+ dorsalis  pedis/posterior tibialis pulses. Psychiatric pleasant and cooperative. General Notes: Third toe amputation site with healthy granulation tissue and epithelization occurring to the edges. No signs of infection. Integumentary (Hair, Skin) Wound #3 status is Open. Original cause of wound was Gradually Appeared. The date acquired was: 04/07/2022. The wound has been in treatment 2 weeks. The wound is located on the Right Amputation Site - T The wound measures 1.9cm length x 0.7cm width x 0.1cm depth; 1.045cm^2 area and 0.104cm^3 volume. oe. There is Fat Layer (Subcutaneous Tissue) exposed. There is no tunneling or undermining noted. There is a medium amount of serosanguineous drainage noted. The wound margin is epibole. There is large (67-100%) red, pink granulation within the wound bed. There is a small (1-33%) amount of necrotic tissue within the wound bed including Adherent Slough. The periwound skin appearance did not exhibit: Callus, Crepitus, Excoriation, Induration, Rash, Scarring, Dry/Scaly, Maceration, Atrophie Blanche, Cyanosis, Ecchymosis, Hemosiderin Staining, Mottled, Pallor, Rubor, Erythema. Periwound temperature was noted as No Abnormality. Assessment Active Problems ICD-10 Non-pressure chronic ulcer of other part of right foot with fat layer exposed Type 2 diabetes mellitus with foot ulcer Atherosclerosis of native arteries of right leg with ulceration of other part of foot Peripheral vascular disease, unspecified Patient's wound has shown improvement in size and appearance since last clinic visit. There are no signs of infection. He was approved for [facility] and this was placed in standard fashion in clinic today. Follow-up in 1 week. He knows to not get this wet. Procedures Wound #3 Pre-procedure diagnosis of Wound #3 is a Diabetic Wound/Ulcer of the Lower Extremity located on the Right Amputation Site - T A skin graft procedure oe. using a bioengineered skin substitute/cellular  or tissue based product was performed by [name], DO with the following instrument(s): Curette, Forceps, and Scissors. Kerecis Omega3 was applied and secured with Steri-Strips. 5.25 sq cm of product was utilized and 5.25 sq cm was wasted due to wound size. Post Application, adaptic, gauze was applied. A Time Out was conducted at 15:11, prior to the start of the procedure. The procedure was tolerated well with a pain level of 0 throughout and a pain level of 0 following the procedure. Post procedure Diagnosis Wound #3: Same as Pre-Procedure . Plan Follow-up Appointments: Return Appointment in 1 week. - w/ Dr. Mikey Bussing and Veneda Melter # 9 Anesthetic: [name] ([id number]) [id number] Page 6 of 8 (In clinic) Topical Lidocaine 5% applied to wound bed Bathing/ Shower/ Hygiene: May shower with protection but do not get wound dressing(s) wet. Protect dressing(s) with water repellant cover (for example, large plastic bag) or a cast cover and may then take shower. Edema Control - Lymphedema / SCD / Other: Elevate legs to the level of the heart or above for 30 minutes daily and/or when sitting for 3-4 times a day throughout the day. Avoid standing for long periods of time. WOUND #3: - Amputation  Site - T oe Wound Laterality: Right Cleanser: Vashe 5.8 (oz) 1 x Per Day/15 Days Discharge Instructions: Cleanse the wound with Vashe prior to applying a clean dressing using gauze sponges, not tissue or cotton balls. Prim Dressing: Hydrofera Blue Ready Transfer Foam, 4x5 (in/in) (Generic) 1 x Per Day/15 Days ary Discharge Instructions: Apply to wound bed as instructed Secondary Dressing: Woven Gauze Sponge, Non-Sterile 4x4 in (Generic) 1 x Per Day/15 Days Discharge Instructions: Apply over primary dressing as directed. Secured With: 37M Medipore H Soft Cloth Surgical T ape, 4 x 10 (in/yd) (Generic) 1 x Per Day/15 Days Discharge Instructions: Secure with tape as  directed. 1. Kerecis placed in standard fashion 2. Follow-up in 1 week Electronic Signature(s) Signed: 07/18/2022 4:07:16 PM By: [name] DO Entered By: [name] on 07/18/2022 15:38:25 -------------------------------------------------------------------------------- HxROS Details Patient Name: Date of Service: [name] 07/18/2022 2:45 PM Medical Record Number: [id number] Patient Account Number: [id number] Date of Birth/Sex: Treating RN: 06/16/1933 (89 y.o. [name] Primary Care Provider: Franchot Erichsen Other Clinician: Haywood Pao Referring Provider: Treating Provider/Extender: Newman Pies Weeks in Treatment: 2 Information Obtained From Patient Eyes Medical History: Positive for: Cataracts - removed both eyes Negative for: Glaucoma; Optic Neuritis Ear/Nose/Mouth/Throat Medical History: Negative for: Chronic sinus problems/congestion; Middle ear problems Past Medical History Notes: hard of hearing Hematologic/Lymphatic Medical History: Positive for: Anemia Negative for: Hemophilia; Human Immunodeficiency Virus; Lymphedema; Sickle Cell Disease Respiratory Medical History: Positive for: Asthma; Chronic Obstructive Pulmonary Disease (COPD) Negative for: Aspiration; Pneumothorax; Sleep Apnea; Tuberculosis Cardiovascular Medical History: Positive for: Peripheral Arterial Disease Endocrine Medical History: Positive for: Type II Diabetes - prediabetes Negative for: Type I Diabetes Time with diabetes: 3 yrs ago Treated with: Diet Blood sugar tested every day: No BRIAR, [name] ([id number]) [id number] Page 7 of 8 Genitourinary Medical History: Negative for: End Stage Renal Disease Integumentary (Skin) Medical History: Negative for: History of Burn Musculoskeletal Medical History: Positive for: Gout; Osteoarthritis Negative for: Rheumatoid Arthritis;  Osteomyelitis Neurologic Medical History: Positive for: Neuropathy Oncologic Medical History: Negative for: Received Chemotherapy; Received Radiation Psychiatric Medical History: Negative for: Anorexia/bulimia; Confinement Anxiety HBO Extended History Items Eyes: Cataracts Immunizations Pneumococcal Vaccine: Received Pneumococcal Vaccination: Yes Received Pneumococcal Vaccination On or After 60th Birthday: Yes Implantable Devices None Hospitalization / Surgery History Type of Hospitalization/Surgery cholecystectomy Family and Social History Cancer: Yes - Siblings; Diabetes: Yes - Siblings,Child; Heart Disease: Yes - Siblings; Hereditary Spherocytosis: No; Hypertension: Yes - Father,Siblings; Kidney Disease: No; Lung Disease: Yes - Siblings; Seizures: No; Stroke: Yes - Siblings; Thyroid Problems: No; Tuberculosis: No; Former smoker - quit 10 yrs ago; Marital Status - Married; Alcohol Use: Never; Drug Use: No History; Caffeine Use: Daily - coffee; Financial Concerns: No; Food, Clothing or Shelter Needs: No; Support System Lacking: No; Transportation Concerns: No Electronic Signature(s) Signed: 07/18/2022 4:07:16 PM By: [name] DO Signed: 08/16/2022 7:49:05 AM By: Brenton Grills Entered By: [name] on 07/18/2022 15:36:38 -------------------------------------------------------------------------------- SuperBill Details Patient Name: Date of Service: [name] 07/18/2022 Medical Record Number: [id number] Patient Account Number: [id number] Date of Birth/Sex: Treating RN: 09-23-1933 (89 y.o. [name] Primary Care Provider: Franchot Erichsen Other Clinician: Haywood Pao Referring Provider: Treating Provider/Extender: Newman Pies Weeks in Treatment: 2 Diagnosis Coding Llano, Ermias ([id number]) [id number] Page 8 of 8 ICD-10 Codes Code Description [phone] Non-pressure chronic ulcer of  other part of right foot with fat layer exposed E11.621 Type 2 diabetes mellitus with foot ulcer I70.235 Atherosclerosis of native  arteries of right leg with ulceration of other part of foot I73.9 Peripheral vascular disease, unspecified Facility Procedures : CPT4 Code: [id number] Description: 15271 - SKIN SUB GRAFT TRNK/ARM/LEG ICD-10 Diagnosis Description L97.512 Non-pressure chronic ulcer of other part of right foot with fat layer exposed E11.621 Type 2 diabetes mellitus with foot ulcer I70.235 Atherosclerosis of native  arteries of right leg with ulceration of other part of fo I73.9 Peripheral vascular disease, unspecified Modifier: ot Quantity: 1 : CPT4 Code: [id number] Description: Q4158 Kerecis Omega Marigen 3 x 3.75 qty 11 ICD-10 Diagnosis Description L97.512 Non-pressure chronic ulcer of other part of right foot with fat layer exposed E11.621 Type 2 diabetes mellitus with foot ulcer I70.235 Atherosclerosis of native  arteries of right leg with ulceration of other part of fo I73.9 Peripheral vascular disease, unspecified Modifier: ot Quantity: 11 Physician Procedures : CPT4 Code Description Modifier [id number] 15271 - WC PHYS SKIN SUB GRAFT TRNK/ARM/LEG ICD-10 Diagnosis Description L97.512 Non-pressure chronic ulcer of other part of right foot with fat layer exposed E11.621 Type 2 diabetes mellitus with foot ulcer  I70.235 Atherosclerosis of native arteries of right leg with ulceration of other part of foot I73.9 Peripheral vascular disease, unspecified Quantity: 1 Electronic Signature(s) Signed: 08/03/2022 11:14:07 AM By: Pearletha Alfred Signed: 08/03/2022 2:57:45 PM By: [name] DO Previous Signature: 07/18/2022 4:07:16 PM Version By: [name] DO Entered By: Pearletha Alfred on 08/03/2022 11:14:07

## 2022-08-16 NOTE — Progress Notes (Signed)
FANTA, Domingo ([id number]) [id number] Page 1 of 7 Visit Report for 07/18/2022 Arrival Information Details Patient Name: Date of Service: [name] [name] 07/18/2022 2:45 PM Medical Record Number: [id number] Patient Account Number: [id number] Date of Birth/Sex: Treating RN: 1933-03-15 (89 y.o. Yates [name] Primary Care Westlynn Fifer: Franchot Erichsen Other Clinician: Haywood Pao Referring Maycee Blasco: Treating Arlyne Brandes/Extender: Newman Pies Weeks in Treatment: 2 Visit Information History Since Last Visit All ordered tests and consults were completed: Yes Patient Arrived: Ambulatory Added or deleted any medications: No Arrival Time: 14:24 Any new allergies or adverse reactions: No Accompanied By: daughter Had a fall or experienced change in No Transfer Assistance: None activities of daily living that may affect Patient Identification Verified: Yes risk of falls: Secondary Verification Process Completed: Yes Signs or symptoms of abuse/neglect since last visito No Patient Requires Transmission-Based Precautions: No Hospitalized since last visit: No Patient Has Alerts: Yes Implantable device outside of the clinic excluding No Patient Alerts: Patient on Blood Thinner cellular tissue based products placed in the center since last visit: Pain Present Now: No Electronic Signature(s) Signed: 07/21/2022 3:01:52 PM By: Haywood Pao CHT EMT BS , , Entered By: Haywood Pao on 07/18/2022 14:44:47 -------------------------------------------------------------------------------- Encounter Discharge Information Details Patient Name: Date of Service: [name] [name] 07/18/2022 2:45 PM Medical Record Number: [id number] Patient Account Number: [id number] Date of Birth/Sex: Treating RN: 1934-01-06 (89 y.o. Yates [name] Primary Care [name]: Franchot Erichsen Other Clinician: Haywood Pao Referring  [name]: Treating [name]/Extender: Newman Pies Weeks in Treatment: 2 Encounter Discharge Information Items Post Procedure Vitals Discharge Condition: Stable Temperature (F): 98.6 Ambulatory Status: Ambulatory Pulse (bpm): 84 Discharge Destination: Home Respiratory Rate (breaths/min): 18 Transportation: Private Auto Blood Pressure (mmHg): 138/72 Accompanied By: daughter Schedule Follow-up Appointment: Yes Clinical Summary of Care: Patient Declined Electronic Signature(s) Signed: 07/21/2022 3:01:52 PM By: Haywood Pao CHT EMT BS , , Entered By: Haywood Pao on 07/18/2022 15:33:11 -------------------------------------------------------------------------------- Lower Extremity Assessment Details Patient Name: Date of Service: [name] [name] 07/18/2022 2:45 PM Medical Record Number: [id number] Patient Account Number: [id number] Date of Birth/Sex: Treating RN: 15-Sep-1933 (89 y.o. Yates [name] Primary Care Orlondo Holycross: Franchot Erichsen Other Clinician: Haywood Pao Referring Miniya Miguez: Treating [name]/Extender: Newman Pies Weeks in Treatment: 2 Edema Assessment Assessed: [name]: No] [Right: No] R[LeftElyn [name], [name] ([id number])] [Right: [id number] Page 2 of 7] Edema: [Left: N] [Right: o] Calf Left: Right: Point of Measurement: From Medial Instep 32.7 cm Ankle Left: Right: Point of Measurement: From Medial Instep 26 cm Knee To Floor Left: Right: From Medial Instep 37 cm Vascular Assessment Pulses: Dorsalis Pedis Palpable: [Right:Yes] Electronic Signature(s) Signed: 07/21/2022 3:01:52 PM By: Haywood Pao CHT EMT BS , , Signed: 08/16/2022 7:49:05 AM By: Brenton Grills Entered By: Haywood Pao on 07/18/2022 14:53:40 -------------------------------------------------------------------------------- Multi Wound Chart Details Patient Name: Date of Service: [name]  [name] 07/18/2022 2:45 PM Medical Record Number: [id number] Patient Account Number: [id number] Date of Birth/Sex: Treating RN: December 02, 1933 (89 y.o. Yates [name] Primary Care [name]: Franchot Erichsen Other Clinician: Haywood Pao Referring Randie Tallarico: Treating [name]/Extender: Newman Pies Weeks in Treatment: 2 Vital Signs Height(in): Capillary Blood Glucose(mg/dl): 416 Weight(lbs): Pulse(bpm): 78 Body Mass Index(BMI): Blood Pressure(mmHg): 141/71 Temperature(F): 97.9 Respiratory Rate(breaths/min): 18 [3:Photos:] [N/A:N/A] Right Amputation Site - Toe N/A N/A Wound Location: Gradually Appeared N/A N/A Wounding Event: Diabetic Wound/Ulcer of the Lower N/A N/A Primary Etiology: Extremity [name], Anemia, Asthma, Chronic N/A N/A Comorbid History: Obstructive Pulmonary Disease (COPD), Peripheral  Arterial Disease, Type II Diabetes, Gout, Osteoarthritis, Neuropathy 04/07/2022 N/A N/A Date Acquired: 2 N/A N/A Weeks of Treatment: Open N/A N/A Wound Status: No N/A N/A Wound Recurrence: 1.9x0.7x0.1 N/A N/A Measurements L x W x D (cm) 1.045 N/A N/A A (cm) : rea 0.104 N/A N/A Volume (cm) : 67.10% N/A N/A % Reduction in Area: 89.10% N/A N/A % Reduction in Volume: Grade 1 N/A N/A ClassificationTameem Pullara, Dung ([id number]) [id number] Page 3 of 7 Medium N/A N/A Exudate Amount: Serosanguineous N/A N/A Exudate Type: red, brown N/A N/A Exudate Color: Epibole N/A N/A Wound Margin: Large (67-100%) N/A N/A Granulation Amount: Red, Pink N/A N/A Granulation Quality: Small (1-33%) N/A N/A Necrotic Amount: Fat Layer (Subcutaneous Tissue): Yes N/A N/A Exposed Structures: Fascia: No Tendon: No Muscle: No Joint: No Bone: No Small (1-33%) N/A N/A Epithelialization: Excoriation: No N/A N/A Periwound Skin Texture: Induration: No Callus: No Crepitus: No Rash: No Scarring: No Maceration: No N/A N/A Periwound  Skin Moisture: Dry/Scaly: No Atrophie Blanche: No N/A N/A Periwound Skin Color: Cyanosis: No Ecchymosis: No Erythema: No Hemosiderin Staining: No Mottled: No Pallor: No Rubor: No No Abnormality N/A N/A Temperature: Cellular or Tissue Based Product N/A N/A Procedures Performed: Treatment Notes Wound #3 (Amputation Site - Toe) Wound Laterality: Right Cleanser Vashe 5.8 (oz) Discharge Instruction: Cleanse the wound with Vashe prior to applying a clean dressing using gauze sponges, not tissue or cotton balls. Peri-Wound Care Topical Primary Dressing Hydrofera Blue Ready Transfer Foam, 4x5 (in/in) Discharge Instruction: Apply to wound bed as instructed Secondary Dressing Woven Gauze Sponge, Non-Sterile 4x4 in Discharge Instruction: Apply over primary dressing as directed. Secured With 53M Medipore H Soft Cloth Surgical T ape, 4 x 10 (in/yd) Discharge Instruction: Secure with tape as directed. Compression Wrap Compression Stockings Add-Ons Electronic Signature(s) Signed: 07/18/2022 4:07:16 PM By: [name] DO Signed: 08/16/2022 7:49:05 AM By: Brenton Grills Entered By: [name] on 07/18/2022 15:35:51 -------------------------------------------------------------------------------- Multi-Disciplinary Care Plan Details Patient Name: Date of Service: [name] [name] 07/18/2022 2:45 PM Medical Record Number: [id number] Patient Account Number: [id number] Date of Birth/Sex: Treating RN: 1933-12-24 (89 y.o. Yates [name] Primary Care Demoni Parmar: Franchot Erichsen Other Clinician: Haywood Pao Referring Carvell Hoeffner: Treating Nathian Stencil/Extender: Candace Gallus in Treatment: 2 [place], [place] ([id number]) [id number] Page 4 of 7 Active Inactive Orientation to the Wound Care Program Nursing Diagnoses: Knowledge deficit related to the wound healing center program Goals: Patient/caregiver will verbalize  understanding of the Wound Healing Center Program Date Initiated: 07/04/2022 Target Resolution Date: 07/14/2022 Goal Status: Active Interventions: Provide education on orientation to the wound center Notes: Wound/Skin Impairment Nursing Diagnoses: Impaired tissue integrity Knowledge deficit related to ulceration/compromised skin integrity Goals: Patient will have a decrease in wound volume by X% from date: (specify in notes) Date Initiated: 07/04/2022 Target Resolution Date: 07/15/2022 Goal Status: Active Patient/caregiver will verbalize understanding of skin care regimen Date Initiated: 07/04/2022 Target Resolution Date: 07/16/2022 Goal Status: Active Ulcer/skin breakdown will have a volume reduction of 30% by week 4 Date Initiated: 07/04/2022 Target Resolution Date: 07/16/2022 Goal Status: Active Ulcer/skin breakdown will have a volume reduction of 50% by week 8 Date Initiated: 07/04/2022 Target Resolution Date: 07/16/2022 Goal Status: Active Interventions: Assess patient/caregiver ability to obtain necessary supplies Assess patient/caregiver ability to perform ulcer/skin care regimen upon admission and as needed Assess ulceration(s) every visit Notes: Electronic Signature(s) Signed: 07/21/2022 3:01:52 PM By: Haywood Pao CHT EMT BS , , Signed: 08/16/2022 7:49:05 AM By: Brenton Grills Entered By: Haywood Pao on 07/18/2022  14:58:32 -------------------------------------------------------------------------------- Pain Assessment Details Patient Name: Date of Service: [name] [name] 07/18/2022 2:45 PM Medical Record Number: [id number] Patient Account Number: [id number] Date of Birth/Sex: Treating RN: [birth year] (89 y.o. Yates [name] Primary Care Dorsey Charette: Franchot Erichsen Other Clinician: Haywood Pao Referring Camdyn Beske: Treating Gaven Eugene/Extender: Newman Pies Weeks in Treatment: 2 Active Problems Location of Pain Severity and  Description of Pain Patient Has Paino No Site Locations [place], [place] ([id number]) [id number] Page 5 of 7 Pain Management and Medication Current Pain Management: Electronic Signature(s) Signed: 07/21/2022 3:01:52 PM By: Haywood Pao CHT EMT BS , , Signed: 08/16/2022 7:49:05 AM By: Brenton Grills Entered By: Haywood Pao on 07/18/2022 14:53:13 -------------------------------------------------------------------------------- Patient/Caregiver Education Details Patient Name: Date of Service: [name] [name] 5/13/2024andnbsp2:45 PM Medical Record Number: [id number] Patient Account Number: [id number] Date of Birth/Gender: Treating RN: 06-06-1933 (89 y.o. Yates [name] Primary Care Physician: Franchot Erichsen Other Clinician: Haywood Pao Referring Physician: Treating Physician/Extender: Candace Gallus in Treatment: 2 Education Assessment Education Provided To: Patient and Caregiver Education Topics Provided Wound/Skin Impairment: Methods: Explain/Verbal Responses: State content correctly Electronic Signature(s) Signed: 07/21/2022 3:01:52 PM By: Haywood Pao CHT EMT BS , , Entered By: Haywood Pao on 07/18/2022 14:58:51 -------------------------------------------------------------------------------- Wound Assessment Details Patient Name: Date of Service: [name] [name] 07/18/2022 2:45 PM Medical Record Number: [id number] Patient Account Number: [id number] Date of Birth/Sex: Treating RN: 07-11-1933 (89 y.o. [name] Primary Care Moraima Burd: Franchot Erichsen Other Clinician: Haywood Pao Referring Jon Kasparek: Treating Tirso Laws/Extender: Newman Pies Weeks in Treatment: 2 Wound Status Wound Number: 3 Primary Diabetic Wound/Ulcer of the Lower Extremity Etiology: Wound Location: Right Amputation Site - Toe Wound Open Wounding Event: Gradually  Appeared Status: Date Acquired: 04/07/2022 Comorbid [name], Anemia, Asthma, Chronic Obstructive Pulmonary Weeks Of Treatment: 2 History: Disease (COPD), Peripheral Arterial Disease, Type II Diabetes, Huberty, Deakon ([id number]) [id number] Page 6 of 7 History: Disease (COPD), Peripheral Arterial Disease, Type II Diabetes, Clustered Wound: No Gout, Osteoarthritis, Neuropathy Photos Wound Measurements Length: (cm) 1.9 Width: (cm) 0.7 Depth: (cm) 0.1 Area: (cm) 1.045 Volume: (cm) 0.104 % Reduction in Area: 67.1% % Reduction in Volume: 89.1% Epithelialization: Small (1-33%) Tunneling: No Undermining: No Wound Description Classification: Grade 1 Wound Margin: Epibole Exudate Amount: Medium Exudate Type: Serosanguineous Exudate Color: red, brown Foul Odor After Cleansing: No Slough/Fibrino Yes Wound Bed Granulation Amount: Large (67-100%) Exposed Structure Granulation Quality: Red, Pink Fascia Exposed: No Necrotic Amount: Small (1-33%) Fat Layer (Subcutaneous Tissue) Exposed: Yes Necrotic Quality: Adherent Slough Tendon Exposed: No Muscle Exposed: No Joint Exposed: No Bone Exposed: No Periwound Skin Texture Texture Color No Abnormalities Noted: No No Abnormalities Noted: No Callus: No Atrophie Blanche: No Crepitus: No Cyanosis: No Excoriation: No Ecchymosis: No Induration: No Erythema: No Rash: No Hemosiderin Staining: No Scarring: No Mottled: No Pallor: No Moisture Rubor: No No Abnormalities Noted: No Dry / Scaly: No Temperature / Pain Maceration: No Temperature: No Abnormality Electronic Signature(s) Signed: 07/18/2022 5:20:12 PM By: Shawn Stall RN, BSN Signed: 08/16/2022 7:49:05 AM By: Brenton Grills Entered By: Brenton Grills on 07/18/2022 14:55:46 -------------------------------------------------------------------------------- Vitals Details Patient Name: Date of Service: [name] [name] 07/18/2022 2:45  PM Medical Record Number: [id number] Patient Account Number: [id number] Date of Birth/Sex: Treating RN: Sep 06, 1933 (89 y.o. Yates [name] Primary Care Sherif Millspaugh: Franchot Erichsen Other Clinician: Haywood Pao Referring Dynastie Knoop: Treating Henrine Hayter/Extender: Newman Pies Weeks in Treatment: 2 Vital Signs [place], Choice ([id number]) [id number] Page 7 of 7 Time Taken:  14:27 Temperature (F): 97.9 Pulse (bpm): 78 Respiratory Rate (breaths/min): 18 Blood Pressure (mmHg): 141/71 Capillary Blood Glucose (mg/dl): 161 Reference Range: 80 - 120 mg / dl Electronic Signature(s) Signed: 07/21/2022 3:01:52 PM By: Haywood Pao CHT EMT BS , , Entered By: Haywood Pao on 07/18/2022 14:44:52

## 2022-08-16 NOTE — Progress Notes (Addendum)
ZAVADA, Willam ([id number]) [id number] Page 1 of 8 Visit Report for 08/15/2022 Chief Complaint Document Details Patient Name: Date of Service: [name] 08/15/2022 2:00 PM Medical Record Number: [id number] Patient Account Number: [id number] Date of Birth/Sex: Treating RN: Jan 28, 1934 (89 y.o. M) Primary Care Provider: Franchot Erichsen Other Clinician: Referring Provider: Treating Provider/Extender: Newman Pies Weeks in Treatment: 6 Information Obtained from: Patient Chief Complaint 07/04/2022; right foot wound Electronic Signature(s) Signed: 08/15/2022 4:48:17 PM By: [name] DO Entered By: [name] on 08/15/2022 14:56:15 -------------------------------------------------------------------------------- HPI Details Patient Name: Date of Service: [name] 08/15/2022 2:00 PM Medical Record Number: [id number] Patient Account Number: [id number] Date of Birth/Sex: Treating RN: Nov 02, 1933 (89 y.o. M) Primary Care Provider: Franchot Erichsen Other Clinician: Referring Provider: Treating Provider/Extender: Newman Pies Weeks in Treatment: 6 History of Present Illness HPI Description: 07/04/2022 [name] is an 89 year old male with a past medical history of uncontrolled, insulin dependent type 2 diabetes, CKD stage III, and COPD that presents to the clinic for a right foot wound. He has an extensive history of peripheral arterial disease to the right leg. He has had a right superficial femoral to posterior tibial artery bypass by Dr. Elonda Husky in 2020. He had a right common femoral to plantar posterior tibial artery bypass on 02/03/2022 by Dr. Lenell Antu and at that time he also had a right toe amputation. He states this wound site had healed completely. Prior to this he had amputations to the first and second digit to the right foot. He presents today with an open wound to the  previous right toe amputation site for the past 2 months. He has been following with vein and vascular for this issue. Most recent ABIs were on 06/22/2022 that showed absent waveforms in the right lower extremity. Currently his right lower extremity bypass is occluded and no plan for another anteriorgram and has he likely has no revascularization options. Currently he has been using Betadine to the wound bed. He denies signs of infection. 07/11/2022 patient arrives in clinic today using Hydrofera Blue on the toe amputation site. The patient is revascularized and is not felt to have any other options for revascularization. We are using Hydrofera Blue and gauze. 5/13; patient presents for follow-up. He has been using Hydrofera Blue to the toe amputation site. He has had no issues. Wound is smaller. He has been approved for [facility] and was agreeable to have this placed in office today. 5/20; patient presents for follow-up. We have been using Kerecis to the wound bed. There is been improvement in healing. Unfortunately patient did not change the outer dressing for a week. There is slight maceration to the periwound. Unclear why he did not do this. 5/30; patient presents for follow-up. We have been using Kerecis to the wound bed. More maceration to the periwound today. No signs of infection. 6/10; patient presents for follow-up. He has been using Hydrofera Blue to the wound bed. He has no issues or complaints today. Maceration has almost completely resolved. Electronic Signature(s) Signed: 08/15/2022 4:48:17 PM By: [name] DO Entered By: [name] on 08/15/2022 14:56:39 [name], [name] ([id number]) [id number] Page 2 of 8 -------------------------------------------------------------------------------- Physical Exam Details Patient Name: Date of Service: [name] 08/15/2022 2:00 PM Medical Record Number: [id number] Patient Account Number:  [id number] Date of Birth/Sex: Treating RN: 1933/12/07 (89 y.o. M) Primary Care Provider: Franchot Erichsen Other Clinician: Referring Provider: Treating Provider/Extender: Newman Pies Weeks in Treatment:  6 Constitutional respirations regular, non-labored and within target range for patient.. Cardiovascular 2+ dorsalis pedis/posterior tibialis pulses. Psychiatric pleasant and cooperative. Notes Third toe amputation site with granulation tissue throughout. No signs of surrounding infection. Electronic Signature(s) Signed: 08/15/2022 4:48:17 PM By: [name] DO Entered By: [name] on 08/15/2022 14:58:01 -------------------------------------------------------------------------------- Physician Orders Details Patient Name: Date of Service: [name] 08/15/2022 2:00 PM Medical Record Number: [id number] Patient Account Number: [id number] Date of Birth/Sex: Treating RN: 06-03-1933 (89 y.o. [name] Primary Care Provider: Franchot Erichsen Other Clinician: Referring Provider: Treating Provider/Extender: Newman Pies Weeks in Treatment: 6 Verbal / Phone Orders: No Diagnosis Coding Follow-up Appointments ppointment in 1 week. - Dr Mikey Bussing Return A Anesthetic (In clinic) Topical Lidocaine 5% applied to wound bed Cellular or Tissue Based Products daptic or Mepitel. (DO NOT REMOVE). - Cellular or Tissue Based Product applied to wound bed, secured with steri-strips, cover with A Kerecis #1 (on hold 08/04/22) Bathing/ Shower/ Hygiene May shower with protection but do not get wound dressing(s) wet. Protect dressing(s) with water repellant cover (for example, large plastic bag) or a cast cover and may then take shower. Edema Control - Lymphedema / SCD / Other Elevate legs to the level of the heart or above for 30 minutes daily and/or when sitting for 3-4 times a day throughout the day. Avoid standing for long periods of  time. Wound Treatment Wound #3 - Amputation Site - Toe Wound Laterality: Right Cleanser: Vashe 5.8 (oz) 1 x Per Day/30 Days Discharge Instructions: Cleanse the wound with Vashe prior to applying a clean dressing using gauze sponges, not tissue or cotton balls. [name], [name] ([id number]) [id number] Page 3 of 8 Topical: Blastex (DME) (Generic) 1 x Per Day/30 Days Prim Dressing: Promogran Prisma Matrix, 4.34 (sq in) (silver collagen) (DME) (Generic) 1 x Per Day/30 Days ary Discharge Instructions: Moisten collagen with saline or hydrogel Secondary Dressing: Woven Gauze Sponge, Non-Sterile 4x4 in (Generic) 1 x Per Day/30 Days Discharge Instructions: Apply over primary dressing as directed. Secured With: Paper Tape, 2x10 (in/yd) 1 x Per Day/30 Days Discharge Instructions: Secure dressing with tape as directed. Electronic Signature(s) Signed: 08/15/2022 4:48:17 PM By: [name] DO Signed: 08/16/2022 7:50:28 AM By: Brenton Grills Entered By: Brenton Grills on 08/15/2022 15:32:31 -------------------------------------------------------------------------------- Problem List Details Patient Name: Date of Service: [name] 08/15/2022 2:00 PM Medical Record Number: [id number] Patient Account Number: [id number] Date of Birth/Sex: Treating RN: [birth year] (89 y.o. [name] Primary Care Provider: Franchot Erichsen Other Clinician: Referring Provider: Treating Provider/Extender: Newman Pies Weeks in Treatment: 6 Active Problems ICD-10 Encounter Code Description Active Date MDM Diagnosis L97.512 Non-pressure chronic ulcer of other part of right foot with fat layer exposed 07/04/2022 No Yes E11.621 Type 2 diabetes mellitus with foot ulcer 07/04/2022 No Yes I70.235 Atherosclerosis of native arteries of right leg with ulceration of other part of 07/04/2022 No Yes foot I73.9 Peripheral vascular disease, unspecified 07/04/2022 No  Yes Inactive Problems Resolved Problems Electronic Signature(s) Signed: 08/15/2022 4:48:17 PM By: [name] DO Entered By: [name] on 08/15/2022 14:56:01 [name], [name] ([id number]) [id number] Page 4 of 8 -------------------------------------------------------------------------------- Progress Note Details Patient Name: Date of Service: [name] 08/15/2022 2:00 PM Medical Record Number: [id number] Patient Account Number: [id number] Date of Birth/Sex: Treating RN: 11/01/1933 (89 y.o. M) Primary Care Provider: Franchot Erichsen Other Clinician: Referring Provider: Treating Provider/Extender: Newman Pies Weeks in Treatment: 6 Subjective Chief Complaint Information obtained from  Patient 07/04/2022; right foot wound History of Present Illness (HPI) 07/04/2022 [name] is an 89 year old male with a past medical history of uncontrolled, insulin dependent type 2 diabetes, CKD stage III, and COPD that presents to the clinic for a right foot wound. He has an extensive history of peripheral arterial disease to the right leg. He has had a right superficial femoral to posterior tibial artery bypass by Dr. Elonda Husky in 2020. He had a right common femoral to plantar posterior tibial artery bypass on 02/03/2022 by Dr. Lenell Antu and at that time he also had a right toe amputation. He states this wound site had healed completely. Prior to this he had amputations to the first and second digit to the right foot. He presents today with an open wound to the previous right toe amputation site for the past 2 months. He has been following with vein and vascular for this issue. Most recent ABIs were on 06/22/2022 that showed absent waveforms in the right lower extremity. Currently his right lower extremity bypass is occluded and no plan for another anteriorgram and has he likely has no revascularization options. Currently he has  been using Betadine to the wound bed. He denies signs of infection. 07/11/2022 patient arrives in clinic today using Hydrofera Blue on the toe amputation site. The patient is revascularized and is not felt to have any other options for revascularization. We are using Hydrofera Blue and gauze. 5/13; patient presents for follow-up. He has been using Hydrofera Blue to the toe amputation site. He has had no issues. Wound is smaller. He has been approved for [facility] and was agreeable to have this placed in office today. 5/20; patient presents for follow-up. We have been using Kerecis to the wound bed. There is been improvement in healing. Unfortunately patient did not change the outer dressing for a week. There is slight maceration to the periwound. Unclear why he did not do this. 5/30; patient presents for follow-up. We have been using Kerecis to the wound bed. More maceration to the periwound today. No signs of infection. 6/10; patient presents for follow-up. He has been using Hydrofera Blue to the wound bed. He has no issues or complaints today. Maceration has almost completely resolved. Patient History Information obtained from Patient. Family History Cancer - Siblings, Diabetes - Siblings,Child, Heart Disease - Siblings, Hypertension - Father,Siblings, Lung Disease - Siblings, Stroke - Siblings, No family history of Hereditary Spherocytosis, Kidney Disease, Seizures, Thyroid Problems, Tuberculosis. Social History Former smoker - quit 10 yrs ago, Marital Status - Married, Alcohol Use - Never, Drug Use - No History, Caffeine Use - Daily - coffee. Medical History Eyes Patient has history of Cataracts - removed both eyes Denies history of Glaucoma, Optic Neuritis Ear/Nose/Mouth/Throat Denies history of Chronic sinus problems/congestion, Middle ear problems Hematologic/Lymphatic Patient has history of Anemia Denies history of Hemophilia, Human Immunodeficiency Virus, Lymphedema, Sickle Cell  Disease Respiratory Patient has history of Asthma, Chronic Obstructive Pulmonary Disease (COPD) Denies history of Aspiration, Pneumothorax, Sleep Apnea, Tuberculosis Cardiovascular Patient has history of Peripheral Arterial Disease Endocrine Patient has history of Type II Diabetes - prediabetes Denies history of Type I Diabetes Genitourinary Denies history of End Stage Renal Disease Integumentary (Skin) Denies history of History of Burn Musculoskeletal Patient has history of Gout, Osteoarthritis Denies history of Rheumatoid Arthritis, Osteomyelitis Neurologic Patient has history of Neuropathy Oncologic Denies history of Received Chemotherapy, Received Radiation Psychiatric Denies history of Anorexia/bulimia, Confinement Anxiety Hospitalization/Surgery History - cholecystectomy. Medical A Surgical History Notes nd Ear/Nose/Mouth/Throat  hard of hearing [name], [name] ([id number]) [id number] Page 5 of 8 Objective Constitutional respirations regular, non-labored and within target range for patient.. Vitals Time Taken: 2:05 PM, Pulse: 106 bpm, Respiratory Rate: 18 breaths/min, Blood Pressure: 146/77 mmHg, Capillary Blood Glucose: 108 mg/dl. Cardiovascular 2+ dorsalis pedis/posterior tibialis pulses. Psychiatric pleasant and cooperative. General Notes: Third toe amputation site with granulation tissue throughout. No signs of surrounding infection. Integumentary (Hair, Skin) Wound #3 status is Open. Original cause of wound was Gradually Appeared. The date acquired was: 04/07/2022. The wound has been in treatment 6 weeks. The wound is located on the Right Amputation Site - T The wound measures 1.4cm length x 0.4cm width x 0.2cm depth; 0.44cm^2 area and 0.088cm^3 volume. oe. There is Fat Layer (Subcutaneous Tissue) exposed. There is no tunneling or undermining noted. There is a medium amount of serosanguineous drainage noted. The wound margin is  epibole. There is large (67-100%) red, pink granulation within the wound bed. There is no necrotic tissue within the wound bed. The periwound skin appearance exhibited: Maceration. The periwound skin appearance did not exhibit: Callus, Crepitus, Excoriation, Induration, Rash, Scarring, Dry/Scaly, Atrophie Blanche, Cyanosis, Ecchymosis, Hemosiderin Staining, Mottled, Pallor, Rubor, Erythema. Periwound temperature was noted as No Abnormality. Assessment Active Problems ICD-10 Non-pressure chronic ulcer of other part of right foot with fat layer exposed Type 2 diabetes mellitus with foot ulcer Atherosclerosis of native arteries of right leg with ulceration of other part of foot Peripheral vascular disease, unspecified Patient's wound is stable with less maceration today. I recommended switching the dressing to blast X and collagen to see if this will further promote healing. Sample was given in office. Follow-up in 1 week. Plan Follow-up Appointments: Return Appointment in 1 week. - Dr Mikey Bussing Anesthetic: (In clinic) Topical Lidocaine 5% applied to wound bed Cellular or Tissue Based Products: Cellular or Tissue Based Product applied to wound bed, secured with steri-strips, cover with Adaptic or Mepitel. (DO NOT REMOVE). - Kerecis #1 (on hold 08/04/22) Bathing/ Shower/ Hygiene: May shower with protection but do not get wound dressing(s) wet. Protect dressing(s) with water repellant cover (for example, large plastic bag) or a cast cover and may then take shower. Edema Control - Lymphedema / SCD / Other: Elevate legs to the level of the heart or above for 30 minutes daily and/or when sitting for 3-4 times a day throughout the day. Avoid standing for long periods of time. WOUND #3: - Amputation Site - T oe Wound Laterality: Right Cleanser: Vashe 5.8 (oz) 1 x Per Day/30 Days Discharge Instructions: Cleanse the wound with Vashe prior to applying a clean dressing using gauze sponges, not tissue or  cotton balls. Topical: Blastex (DME) (Generic) 1 x Per Day/30 Days Prim Dressing: Promogran Prisma Matrix, 4.34 (sq in) (silver collagen) (DME) (Generic) 1 x Per Day/30 Days ary Discharge Instructions: Moisten collagen with saline or hydrogel Secondary Dressing: Woven Gauze Sponge, Non-Sterile 4x4 in (Generic) 1 x Per Day/30 Days Discharge Instructions: Apply over primary dressing as directed. Secured With: Paper T ape, 2x10 (in/yd) 1 x Per Day/30 Days Discharge Instructions: Secure dressing with tape as directed. 1. Collagen with blast X 2. Follow-up in 1 week 3. Aggressive offloading [name], [name] ([id number]) [id number].pdf Page 6 of 8 Electronic Signature(s) Signed: 08/18/2022 5:31:32 PM By: Shawn Stall RN, BSN Signed: 09/05/2022 1:38:23 PM By: [name] DO Previous Signature: 08/15/2022 4:48:17 PM Version By: [name] DO Entered By: Shawn Stall on 08/18/2022 16:52:42 -------------------------------------------------------------------------------- HxROS Details Patient Name: Date of Service: [name],  [name] 08/15/2022 2:00 PM Medical Record Number: [id number] Patient Account Number: [id number] Date of Birth/Sex: Treating RN: 1934/02/25 (89 y.o. M) Primary Care Provider: Franchot Erichsen Other Clinician: Referring Provider: Treating Provider/Extender: Newman Pies Weeks in Treatment: 6 Information Obtained From Patient Eyes Medical History: Positive for: Cataracts - removed both eyes Negative for: Glaucoma; Optic Neuritis Ear/Nose/Mouth/Throat Medical History: Negative for: Chronic sinus problems/congestion; Middle ear problems Past Medical History Notes: hard of hearing Hematologic/Lymphatic Medical History: Positive for: Anemia Negative for: Hemophilia; Human Immunodeficiency Virus; Lymphedema; Sickle Cell Disease Respiratory Medical History: Positive for: Asthma; Chronic Obstructive Pulmonary  Disease (COPD) Negative for: Aspiration; Pneumothorax; Sleep Apnea; Tuberculosis Cardiovascular Medical History: Positive for: Peripheral Arterial Disease Endocrine Medical History: Positive for: Type II Diabetes - prediabetes Negative for: Type I Diabetes Time with diabetes: 3 yrs ago Treated with: Diet Blood sugar tested every day: No Genitourinary Medical History: Negative for: End Stage Renal Disease Integumentary (Skin) Medical History: Negative for: History of Burn [name], [name] ([id number]) [id number] Page 7 of 8 Musculoskeletal Medical History: Positive for: Gout; Osteoarthritis Negative for: Rheumatoid Arthritis; Osteomyelitis Neurologic Medical History: Positive for: Neuropathy Oncologic Medical History: Negative for: Received Chemotherapy; Received Radiation Psychiatric Medical History: Negative for: Anorexia/bulimia; Confinement Anxiety HBO Extended History Items Eyes: Cataracts Immunizations Pneumococcal Vaccine: Received Pneumococcal Vaccination: Yes Received Pneumococcal Vaccination On or After 60th Birthday: Yes Implantable Devices None Hospitalization / Surgery History Type of Hospitalization/Surgery cholecystectomy Family and Social History Cancer: Yes - Siblings; Diabetes: Yes - Siblings,Child; Heart Disease: Yes - Siblings; Hereditary Spherocytosis: No; Hypertension: Yes - Father,Siblings; Kidney Disease: No; Lung Disease: Yes - Siblings; Seizures: No; Stroke: Yes - Siblings; Thyroid Problems: No; Tuberculosis: No; Former smoker - quit 10 yrs ago; Marital Status - Married; Alcohol Use: Never; Drug Use: No History; Caffeine Use: Daily - coffee; Financial Concerns: No; Food, Clothing or Shelter Needs: No; Support System Lacking: No; Transportation Concerns: No Electronic Signature(s) Signed: 08/15/2022 4:48:17 PM By: [name] DO Entered By: [name] on 08/15/2022  14:56:47 -------------------------------------------------------------------------------- SuperBill Details Patient Name: Date of Service: [name] 08/15/2022 Medical Record Number: [id number] Patient Account Number: [id number] Date of Birth/Sex: Treating RN: [birth year] (89 y.o. [name] Primary Care Provider: Franchot Erichsen Other Clinician: Referring Provider: Treating Provider/Extender: Newman Pies Weeks in Treatment: 6 Diagnosis Coding ICD-10 Codes Code Description [phone] Non-pressure chronic ulcer of other part of right foot with fat layer exposed E11.621 Type 2 diabetes mellitus with foot ulcer I70.235 Atherosclerosis of native arteries of right leg with ulceration of other part of foot I73.9 Peripheral vascular disease, unspecified Wrinkle, Edder ([id number]) [id number] Page 8 of 8 Facility Procedures : CPT4 Code: [id number] Description: 99213 - WOUND CARE VISIT-LEV 3 EST PT Modifier: Quantity: 1 Physician Procedures : CPT4 Code Description Modifier [id number] - WC PHYS LEVEL 3 - EST PT ICD-10 Diagnosis Description L97.512 Non-pressure chronic ulcer of other part of right foot with fat layer exposed E11.621 Type 2 diabetes mellitus with foot ulcer I70.235  Atherosclerosis of native arteries of right leg with ulceration of other part of foot I73.9 Peripheral vascular disease, unspecified Quantity: 1 Electronic Signature(s) Signed: 08/15/2022 4:48:17 PM By: [name] DO Entered By: [name] on 08/15/2022 15:01:19

## 2022-08-18 ENCOUNTER — Other Ambulatory Visit: Payer: Self-pay | Admitting: Family Medicine

## 2022-08-22 ENCOUNTER — Other Ambulatory Visit: Payer: Self-pay | Admitting: Family Medicine

## 2022-08-23 ENCOUNTER — Encounter (HOSPITAL_BASED_OUTPATIENT_CLINIC_OR_DEPARTMENT_OTHER): Payer: PPO | Admitting: Internal Medicine

## 2022-08-23 DIAGNOSIS — E11621 Type 2 diabetes mellitus with foot ulcer: Secondary | ICD-10-CM

## 2022-08-23 DIAGNOSIS — L97512 Non-pressure chronic ulcer of other part of right foot with fat layer exposed: Secondary | ICD-10-CM

## 2022-08-23 NOTE — Progress Notes (Signed)
MARINEZ, [name] ([id number]) [id number] Page 1 of 9 Visit Report for 08/23/2022 Chief Complaint Document Details Patient Name: Date of Service: [name] 08/23/2022 1:15 PM Medical Record Number: [id number] Patient Account Number: [id number] Date of Birth/Sex: Treating RN: 30-Aug-1933 (89 y.o. M) Primary Care Provider: Franchot Erichsen Other Clinician: Referring Provider: Treating Provider/Extender: Newman Pies Weeks in Treatment: 7 Information Obtained from: Patient Chief Complaint 07/04/2022; right foot wound Electronic Signature(s) Signed: 08/23/2022 3:45:24 PM By: [name] DO Entered By: [name] on 08/23/2022 13:47:09 -------------------------------------------------------------------------------- Debridement Details Patient Name: Date of Service: [name] 08/23/2022 1:15 PM Medical Record Number: [id number] Patient Account Number: [id number] Date of Birth/Sex: Treating RN: 1933/10/02 (89 y.o. [name], [name] Primary Care Provider: Franchot Erichsen Other Clinician: Referring Provider: Treating Provider/Extender: Newman Pies Weeks in Treatment: 7 Debridement Performed for Assessment: Wound #3 Right Amputation Site - Toe Performed By: Physician [name], DO Debridement Type: Debridement Severity of Tissue Pre Debridement: Fat layer exposed Level of Consciousness (Pre-procedure): Awake and Alert Pre-procedure Verification/Time Out Yes - 13:20 Taken: Start Time: 13:21 Pain Control: Lidocaine 4% T opical Solution Percent of Wound Bed Debrided: 100% T Area Debrided (cm): otal 0.44 Tissue and other material debrided: Viable, Non-Viable, Slough, Skin: Dermis , Skin: Epidermis, Biofilm, Slough Level: Skin/Epidermis Debridement Description: Selective/Open Wound Instrument: Curette Bleeding: Minimum Hemostasis Achieved: Pressure End Time: 13:27 Procedural  Pain: 0 Post Procedural Pain: 0 Response to Treatment: Procedure was tolerated well Level of Consciousness (Post- Awake and Alert procedure): Post Debridement Measurements of Total Wound Length: (cm) 1.4 Width: (cm) 0.4 Depth: (cm) 0.2 Volume: (cm) 0.088 Character of Wound/Ulcer Post Debridement: Improved [name], [name] ([id number]) [id number] Page 2 of 9 Severity of Tissue Post Debridement: Fat layer exposed Post Procedure Diagnosis Same as Pre-procedure Electronic Signature(s) Signed: 08/23/2022 3:42:23 PM By: Shawn Stall RN, BSN Signed: 08/23/2022 3:45:24 PM By: [name] DO Entered By: Shawn Stall on 08/23/2022 13:27:51 -------------------------------------------------------------------------------- HPI Details Patient Name: Date of Service: [name] 08/23/2022 1:15 PM Medical Record Number: [id number] Patient Account Number: [id number] Date of Birth/Sex: Treating RN: June 23, 1933 (89 y.o. M) Primary Care Provider: Franchot Erichsen Other Clinician: Referring Provider: Treating Provider/Extender: Newman Pies Weeks in Treatment: 7 History of Present Illness HPI Description: 07/04/2022 Mr. [name] is an 89 year old male with a past medical history of uncontrolled, insulin dependent type 2 diabetes, CKD stage III, and COPD that presents to the clinic for a right foot wound. He has an extensive history of peripheral arterial disease to the right leg. He has had a right superficial femoral to posterior tibial artery bypass by Dr. Elonda Husky in 2020. He had a right common femoral to plantar posterior tibial artery bypass on 02/03/2022 by Dr. Lenell Antu and at that time he also had a right toe amputation. He states this wound site had healed completely. Prior to this he had amputations to the first and second digit to the right foot. He presents today with an open wound to the previous right toe amputation site  for the past 2 months. He has been following with vein and vascular for this issue. Most recent ABIs were on 06/22/2022 that showed absent waveforms in the right lower extremity. Currently his right lower extremity bypass is occluded and no plan for another anteriorgram and has he likely has no revascularization options. Currently he has been using Betadine to the wound bed. He denies signs of infection. 07/11/2022 patient arrives in clinic today  using Hydrofera Blue on the toe amputation site. The patient is revascularized and is not felt to have any other options for revascularization. We are using Hydrofera Blue and gauze. 5/13; patient presents for follow-up. He has been using Hydrofera Blue to the toe amputation site. He has had no issues. Wound is smaller. He has been approved for [facility] and was agreeable to have this placed in office today. 5/20; patient presents for follow-up. We have been using Kerecis to the wound bed. There is been improvement in healing. Unfortunately patient did not change the outer dressing for a week. There is slight maceration to the periwound. Unclear why he did not do this. 5/30; patient presents for follow-up. We have been using Kerecis to the wound bed. More maceration to the periwound today. No signs of infection. 6/10; patient presents for follow-up. He has been using Hydrofera Blue to the wound bed. He has no issues or complaints today. Maceration has almost completely resolved. 6/18; patient presents for follow-up. He has been using blast X and collagen to the wound bed without issues. Wound appears smaller today. Electronic Signature(s) Signed: 08/23/2022 3:45:24 PM By: [name] DO Entered By: [name] on 08/23/2022 13:47:43 -------------------------------------------------------------------------------- Physical Exam Details Patient Name: Date of Service: [name] 08/23/2022 1:15 PM Medical Record Number: [id number] Patient  Account Number: [id number] Date of Birth/Sex: Treating RN: [birth year] (89 y.o. M) Primary Care Provider: Franchot Erichsen Other Clinician: Referring Provider: Treating Provider/Extender: Candace Gallus in Treatment: [address] [name] ([id number]) [id number] Page 3 of 9 Constitutional respirations regular, non-labored and within target range for patient.. Cardiovascular 2+ dorsalis pedis/posterior tibialis pulses. Psychiatric pleasant and cooperative. Notes Third toe amputation site with granulation tissue and slough accumulation. No signs of surrounding infection. Electronic Signature(s) Signed: 08/23/2022 3:45:24 PM By: [name] DO Entered By: [name] on 08/23/2022 13:48:16 -------------------------------------------------------------------------------- Physician Orders Details Patient Name: Date of Service: [name] 08/23/2022 1:15 PM Medical Record Number: [id number] Patient Account Number: [id number] Date of Birth/Sex: Treating RN: 09/01/1933 (89 y.o. [name] Primary Care Provider: Franchot Erichsen Other Clinician: Referring Provider: Treating Provider/Extender: Newman Pies Weeks in Treatment: 7 Verbal / Phone Orders: No Diagnosis Coding Follow-up Appointments ppointment in 1 week. - Dr Mikey Bussing Return A Other: - Call [employer] to send back the orders. [employer] blastx and collagen. Anesthetic (In clinic) Topical Lidocaine 5% applied to wound bed Bathing/ Shower/ Hygiene May shower with protection but do not get wound dressing(s) wet. Protect dressing(s) with water repellant cover (for example, large plastic bag) or a cast cover and may then take shower. Edema Control - Lymphedema / SCD / Other Elevate legs to the level of the heart or above for 30 minutes daily and/or when sitting for 3-4 times a day throughout the day. Avoid standing for long  periods of time. Wound Treatment Wound #3 - Amputation Site - Toe Wound Laterality: Right Cleanser: Vashe 5.8 (oz) 1 x Per Day/30 Days Discharge Instructions: Cleanse the wound with Vashe prior to applying a clean dressing using gauze sponges, not tissue or cotton balls. Topical: Blastex (Generic) 1 x Per Day/30 Days Prim Dressing: Promogran Prisma Matrix, 4.34 (sq in) (silver collagen) (Generic) 1 x Per Day/30 Days ary Discharge Instructions: Moisten collagen with saline or hydrogel Secondary Dressing: Woven Gauze Sponge, Non-Sterile 4x4 in (Generic) 1 x Per Day/30 Days Discharge Instructions: Apply over primary dressing as directed. Secured With: Paper Tape, 2x10 (in/yd) 1 x  Per Day/30 Days Discharge Instructions: Secure dressing with tape as directed. Electronic Signature(s) Signed: 08/23/2022 3:45:24 PM By: [name] DO Entered By: [name] on 08/23/2022 13:48:26 [name], [name] ([id number]) [id number] Page 4 of 9 -------------------------------------------------------------------------------- Problem List Details Patient Name: Date of Service: [name] 08/23/2022 1:15 PM Medical Record Number: [id number] Patient Account Number: [id number] Date of Birth/Sex: Treating RN: 1933/06/19 (89 y.o. M) Primary Care Provider: Franchot Erichsen Other Clinician: Referring Provider: Treating Provider/Extender: Newman Pies Weeks in Treatment: 7 Active Problems ICD-10 Encounter Code Description Active Date MDM Diagnosis L97.512 Non-pressure chronic ulcer of other part of right foot with fat layer exposed 07/04/2022 No Yes E11.621 Type 2 diabetes mellitus with foot ulcer 07/04/2022 No Yes I70.235 Atherosclerosis of native arteries of right leg with ulceration of other part of 07/04/2022 No Yes foot I73.9 Peripheral vascular disease, unspecified 07/04/2022 No Yes Inactive Problems Resolved Problems Electronic  Signature(s) Signed: 08/23/2022 3:45:24 PM By: [name] DO Entered By: [name] on 08/23/2022 13:46:57 -------------------------------------------------------------------------------- Progress Note Details Patient Name: Date of Service: [name] 08/23/2022 1:15 PM Medical Record Number: [id number] Patient Account Number: [id number] Date of Birth/Sex: Treating RN: 04-Feb-1934 (89 y.o. M) Primary Care Provider: Franchot Erichsen Other Clinician: Referring Provider: Treating Provider/Extender: Newman Pies Weeks in Treatment: 7 Subjective Chief Complaint Information obtained from Patient 07/04/2022; right foot wound History of Present Illness (HPI) 07/04/2022 Mr. Fred Adjei is an 89 year old male with a past medical history of uncontrolled, insulin dependent type 2 diabetes, CKD stage III, and COPD that presents to the clinic for a right foot wound. He has an extensive history of peripheral arterial disease to the right leg. He has had a right superficial femoral to posterior tibial artery bypass by Dr. Elonda Husky in 2020. He had a right common femoral to plantar posterior tibial artery bypass on 02/03/2022 by Dr. Carmin Muskrat, [name] ([id number]) [id number] Page 5 of 9 and at that time he also had a right toe amputation. He states this wound site had healed completely. Prior to this he had amputations to the first and second digit to the right foot. He presents today with an open wound to the previous right toe amputation site for the past 2 months. He has been following with vein and vascular for this issue. Most recent ABIs were on 06/22/2022 that showed absent waveforms in the right lower extremity. Currently his right lower extremity bypass is occluded and no plan for another anteriorgram and has he likely has no revascularization options. Currently he has been using Betadine to the wound bed. He denies  signs of infection. 07/11/2022 patient arrives in clinic today using Hydrofera Blue on the toe amputation site. The patient is revascularized and is not felt to have any other options for revascularization. We are using Hydrofera Blue and gauze. 5/13; patient presents for follow-up. He has been using Hydrofera Blue to the toe amputation site. He has had no issues. Wound is smaller. He has been approved for [facility] and was agreeable to have this placed in office today. 5/20; patient presents for follow-up. We have been using Kerecis to the wound bed. There is been improvement in healing. Unfortunately patient did not change the outer dressing for a week. There is slight maceration to the periwound. Unclear why he did not do this. 5/30; patient presents for follow-up. We have been using Kerecis to the wound bed. More maceration to the periwound today. No signs of infection. 6/10; patient presents  for follow-up. He has been using Hydrofera Blue to the wound bed. He has no issues or complaints today. Maceration has almost completely resolved. 6/18; patient presents for follow-up. He has been using blast X and collagen to the wound bed without issues. Wound appears smaller today. Patient History Information obtained from Patient. Family History Cancer - Siblings, Diabetes - Siblings,Child, Heart Disease - Siblings, Hypertension - Father,Siblings, Lung Disease - Siblings, Stroke - Siblings, No family history of Hereditary Spherocytosis, Kidney Disease, Seizures, Thyroid Problems, Tuberculosis. Social History Former smoker - quit 10 yrs ago, Marital Status - Married, Alcohol Use - Never, Drug Use - No History, Caffeine Use - Daily - coffee. Medical History Eyes Patient has history of Cataracts - removed both eyes Denies history of Glaucoma, Optic Neuritis Ear/Nose/Mouth/Throat Denies history of Chronic sinus problems/congestion, Middle ear problems Hematologic/Lymphatic Patient has history of  Anemia Denies history of Hemophilia, Human Immunodeficiency Virus, Lymphedema, Sickle Cell Disease Respiratory Patient has history of Asthma, Chronic Obstructive Pulmonary Disease (COPD) Denies history of Aspiration, Pneumothorax, Sleep Apnea, Tuberculosis Cardiovascular Patient has history of Peripheral Arterial Disease Endocrine Patient has history of Type II Diabetes - prediabetes Denies history of Type I Diabetes Genitourinary Denies history of End Stage Renal Disease Integumentary (Skin) Denies history of History of Burn Musculoskeletal Patient has history of Gout, Osteoarthritis Denies history of Rheumatoid Arthritis, Osteomyelitis Neurologic Patient has history of Neuropathy Oncologic Denies history of Received Chemotherapy, Received Radiation Psychiatric Denies history of Anorexia/bulimia, Confinement Anxiety Hospitalization/Surgery History - cholecystectomy. Medical A Surgical History Notes nd Ear/Nose/Mouth/Throat hard of hearing Objective Constitutional respirations regular, non-labored and within target range for patient.. Vitals Time Taken: 1:17 PM, Temperature: 98 F, Pulse: 93 bpm, Respiratory Rate: 20 breaths/min, Blood Pressure: 152/84 mmHg, Capillary Blood Glucose: 121 mg/dl. Cardiovascular 2+ dorsalis pedis/posterior tibialis pulses. SITES, [name] ([id number]) [id number] Page 6 of 9 Psychiatric pleasant and cooperative. General Notes: Third toe amputation site with granulation tissue and slough accumulation. No signs of surrounding infection. Integumentary (Hair, Skin) Wound #3 status is Open. Original cause of wound was Gradually Appeared. The date acquired was: 04/07/2022. The wound has been in treatment 7 weeks. The wound is located on the Right Amputation Site - T The wound measures 1.4cm length x 0.4cm width x 0.2cm depth; 0.44cm^2 area and 0.088cm^3 volume. oe. There is Fat Layer (Subcutaneous Tissue) exposed. There is  no tunneling or undermining noted. There is a medium amount of serosanguineous drainage noted. The wound margin is epibole. There is large (67-100%) red, pink granulation within the wound bed. There is no necrotic tissue within the wound bed. The periwound skin appearance did not exhibit: Callus, Crepitus, Excoriation, Induration, Rash, Scarring, Dry/Scaly, Maceration, Atrophie Blanche, Cyanosis, Ecchymosis, Hemosiderin Staining, Mottled, Pallor, Rubor, Erythema. Periwound temperature was noted as No Abnormality. Assessment Active Problems ICD-10 Non-pressure chronic ulcer of other part of right foot with fat layer exposed Type 2 diabetes mellitus with foot ulcer Atherosclerosis of native arteries of right leg with ulceration of other part of foot Peripheral vascular disease, unspecified Patient's wound appears well-healing. I debrided nonviable tissue. I recommended continuing blast X and collagen. Follow-up in 1 week. Procedures Wound #3 Pre-procedure diagnosis of Wound #3 is a Diabetic Wound/Ulcer of the Lower Extremity located on the Right Amputation Site - T .Severity of Tissue Pre oe Debridement is: Fat layer exposed. There was a Selective/Open Wound Skin/Epidermis Debridement with a total area of 0.44 sq cm performed by [name], DO. With the following instrument(s): Curette to remove Viable and Non-Viable  tissue/material. Material removed includes Slough, Skin: Dermis, Skin: Epidermis, and Biofilm after achieving pain control using Lidocaine 4% Topical Solution. A time out was conducted at 13:20, prior to the start of the procedure. A Minimum amount of bleeding was controlled with Pressure. The procedure was tolerated well with a pain level of 0 throughout and a pain level of 0 following the procedure. Post Debridement Measurements: 1.4cm length x 0.4cm width x 0.2cm depth; 0.088cm^3 volume. Character of Wound/Ulcer Post Debridement is improved. Severity of Tissue Post  Debridement is: Fat layer exposed. Post procedure Diagnosis Wound #3: Same as Pre-Procedure Plan Follow-up Appointments: Return Appointment in 1 week. - Dr Mikey Bussing Other: - Call [employer] to send back the orders. [employer] blastx and collagen. Anesthetic: (In clinic) Topical Lidocaine 5% applied to wound bed Bathing/ Shower/ Hygiene: May shower with protection but do not get wound dressing(s) wet. Protect dressing(s) with water repellant cover (for example, large plastic bag) or a cast cover and may then take shower. Edema Control - Lymphedema / SCD / Other: Elevate legs to the level of the heart or above for 30 minutes daily and/or when sitting for 3-4 times a day throughout the day. Avoid standing for long periods of time. WOUND #3: - Amputation Site - T oe Wound Laterality: Right Cleanser: Vashe 5.8 (oz) 1 x Per Day/30 Days Discharge Instructions: Cleanse the wound with Vashe prior to applying a clean dressing using gauze sponges, not tissue or cotton balls. Topical: Blastex (Generic) 1 x Per Day/30 Days Prim Dressing: Promogran Prisma Matrix, 4.34 (sq in) (silver collagen) (Generic) 1 x Per Day/30 Days ary Discharge Instructions: Moisten collagen with saline or hydrogel Secondary Dressing: Woven Gauze Sponge, Non-Sterile 4x4 in (Generic) 1 x Per Day/30 Days Discharge Instructions: Apply over primary dressing as directed. Secured With: Paper T ape, 2x10 (in/yd) 1 x Per Day/30 Days Discharge Instructions: Secure dressing with tape as directed. 1. In office sharp debridement 2. Blast X and collagen 3. Follow-up in 1 week Electronic Signature(s) Signed: 08/23/2022 3:45:24 PM By: Minus Liberty, Labrian 3:45:24 PM By: [name] DO Signed: 08/23/2022 ([id number]) [id number] Page 7 of 9 Entered By: [name] on 08/23/2022 13:50:35 -------------------------------------------------------------------------------- HxROS  Details Patient Name: Date of Service: [name] 08/23/2022 1:15 PM Medical Record Number: [id number] Patient Account Number: [id number] Date of Birth/Sex: Treating RN: [birth year] (89 y.o. M) Primary Care Provider: Franchot Erichsen Other Clinician: Referring Provider: Treating Provider/Extender: Newman Pies Weeks in Treatment: 7 Information Obtained From Patient Eyes Medical History: Positive for: Cataracts - removed both eyes Negative for: Glaucoma; Optic Neuritis Ear/Nose/Mouth/Throat Medical History: Negative for: Chronic sinus problems/congestion; Middle ear problems Past Medical History Notes: hard of hearing Hematologic/Lymphatic Medical History: Positive for: Anemia Negative for: Hemophilia; Human Immunodeficiency Virus; Lymphedema; Sickle Cell Disease Respiratory Medical History: Positive for: Asthma; Chronic Obstructive Pulmonary Disease (COPD) Negative for: Aspiration; Pneumothorax; Sleep Apnea; Tuberculosis Cardiovascular Medical History: Positive for: Peripheral Arterial Disease Endocrine Medical History: Positive for: Type II Diabetes - prediabetes Negative for: Type I Diabetes Time with diabetes: 3 yrs ago Treated with: Diet Blood sugar tested every day: No Genitourinary Medical History: Negative for: End Stage Renal Disease Integumentary (Skin) Medical History: Negative for: History of Burn Musculoskeletal Medical History: Positive for: Gout; Osteoarthritis Negative for: Rheumatoid Arthritis; Osteomyelitis [name], [name] ([id number]) [id number] Page 8 of 9 Neurologic Medical History: Positive for: Neuropathy Oncologic Medical History: Negative for: Received Chemotherapy; Received Radiation Psychiatric Medical History: Negative for: Anorexia/bulimia; Confinement Anxiety  HBO Extended History Items Eyes: Cataracts Immunizations Pneumococcal Vaccine: Received Pneumococcal  Vaccination: Yes Received Pneumococcal Vaccination On or After 60th Birthday: Yes Implantable Devices None Hospitalization / Surgery History Type of Hospitalization/Surgery cholecystectomy Family and Social History Cancer: Yes - Siblings; Diabetes: Yes - Siblings,Child; Heart Disease: Yes - Siblings; Hereditary Spherocytosis: No; Hypertension: Yes - Father,Siblings; Kidney Disease: No; Lung Disease: Yes - Siblings; Seizures: No; Stroke: Yes - Siblings; Thyroid Problems: No; Tuberculosis: No; Former smoker - quit 10 yrs ago; Marital Status - Married; Alcohol Use: Never; Drug Use: No History; Caffeine Use: Daily - coffee; Financial Concerns: No; Food, Clothing or Shelter Needs: No; Support System Lacking: No; Transportation Concerns: No Electronic Signature(s) Signed: 08/23/2022 3:45:24 PM By: [name] DO Entered By: [name] on 08/23/2022 13:47:50 -------------------------------------------------------------------------------- SuperBill Details Patient Name: Date of Service: [name] 08/23/2022 Medical Record Number: [id number] Patient Account Number: [id number] Date of Birth/Sex: Treating RN: [birth year] (89 y.o. [name], [name] Primary Care Provider: Franchot Erichsen Other Clinician: Referring Provider: Treating Provider/Extender: Newman Pies Weeks in Treatment: 7 Diagnosis Coding ICD-10 Codes Code Description [phone] Non-pressure chronic ulcer of other part of right foot with fat layer exposed E11.621 Type 2 diabetes mellitus with foot ulcer I70.235 Atherosclerosis of native arteries of right leg with ulceration of other part of foot I73.9 Peripheral vascular disease, unspecified Facility Procedures : [name] CPT4 Code: [id number] ON, [name] ([id number] Description: 97597 - DEBRIDE WOUND 1ST 20 SQ CM OR < ICD-10 Diagnosis Description 3220) [phone] L97.512 Non-pressure chronic ulcer of other part of right foot with fat layer  exposed E11.621 Type 2 diabetes mellitus with foot ulcer Modifier: [id number] Quantity: 1 227.pdf Page 9 of 9 Physician Procedures : CPT4 Code Description Modifier [id number] - WC PHYS DEBR WO ANESTH 20 SQ CM ICD-10 Diagnosis Description L97.512 Non-pressure chronic ulcer of other part of right foot with fat layer exposed E11.621 Type 2 diabetes mellitus with foot ulcer Quantity: 1 Electronic Signature(s) Signed: 08/23/2022 3:45:24 PM By: [name] DO Entered By: [name] on 08/23/2022 13:50:46

## 2022-08-23 NOTE — Progress Notes (Signed)
FLANNELLY, Jorryn ([id number]) [id number] Page 1 of 6 Visit Report for 08/23/2022 Arrival Information Details Patient Name: Date of Service: [name] 08/23/2022 1:15 PM Medical Record Number: [id number] Patient Account Number: [id number] Date of Birth/Sex: Treating RN: 1934/03/04 (89 y.o. [name] Primary Care [name]: [name] Other Clinician: Referring [name]: Treating Natayla Cadenhead/Extender: [name] Weeks in Treatment: 7 Visit Information History Since Last Visit Added or deleted any medications: No Patient Arrived: Ambulatory Any new allergies or adverse reactions: No Arrival Time: 13:14 Had a fall or experienced change in No Accompanied By: daughter activities of daily living that may affect Transfer Assistance: None risk of falls: Patient Identification Verified: Yes Signs or symptoms of abuse/neglect since last visito No Secondary Verification Process Completed: Yes Hospitalized since last visit: No Patient Requires Transmission-Based Precautions: No Implantable device outside of the clinic excluding No Patient Has Alerts: Yes cellular tissue based products placed in the center Patient Alerts: Patient on Blood Thinner since last visit: Has Dressing in Place as Prescribed: Yes Has Footwear/Offloading in Place as Prescribed: No Right: Other:regular shoes Pain Present Now: No Electronic Signature(s) Signed: 08/23/2022 3:42:23 PM By: [name] Stall RN, BSN Entered By: [name] on 08/23/2022 13:15:01 -------------------------------------------------------------------------------- Lower Extremity Assessment Details Patient Name: Date of Service: [name] 08/23/2022 1:15 PM Medical Record Number: [id number] Patient Account Number: [id number] Date of Birth/Sex: Treating RN: 11-Nov-1933 (89 y.o. [name] Primary Care [name]: [name] Other Clinician: Referring  Shirle Provencal: Treating [name]/Extender: [name] Weeks in Treatment: 7 Edema Assessment Assessed: [Left: No] [Right: Yes] Edema: [Left: N] [Right: o] Calf Left: Right: Point of Measurement: From Medial Instep 31 cm Ankle Left: Right: Point of Measurement: From Medial Instep 19 cm Vascular Assessment Pulses: Dorsalis Pedis [name], [name] ([id number]) [Right:[id number] Page 2 of 6 Yes] Electronic Signature(s) Signed: 08/23/2022 3:42:23 PM By: [name] Stall RN, BSN Entered By: [name] on 08/23/2022 13:16:19 -------------------------------------------------------------------------------- Multi Wound Chart Details Patient Name: Date of Service: [name] 08/23/2022 1:15 PM Medical Record Number: [id number] Patient Account Number: [id number] Date of Birth/Sex: Treating RN: 01/09/1934 (89 y.o. M) Primary Care Petrita Blunck: [name] Other Clinician: Referring Larin Depaoli: Treating [name]/Extender: [name] Weeks in Treatment: 7 Vital Signs Height(in): Capillary Blood Glucose(mg/dl): 244 Weight(lbs): Pulse(bpm): 93 Body Mass Index(BMI): Blood Pressure(mmHg): 152/84 Temperature(F): 98 Respiratory Rate(breaths/min): 20 [3:Photos:] [N/A:N/A] Right Amputation Site - Toe N/A N/A Wound Location: Gradually Appeared N/A N/A Wounding Event: Diabetic Wound/Ulcer of the Lower N/A N/A Primary Etiology: Extremity Cataracts, Anemia, Asthma, Chronic N/A N/A Comorbid History: Obstructive Pulmonary Disease (COPD), Peripheral Arterial Disease, Type II Diabetes, Gout, Osteoarthritis, Neuropathy 04/07/2022 N/A N/A Date Acquired: 7 N/A N/A Weeks of Treatment: Open N/A N/A Wound Status: No N/A N/A Wound Recurrence: 1.4x0.4x0.2 N/A N/A Measurements L x W x D (cm) 0.44 N/A N/A A (cm) : rea 0.088 N/A N/A Volume (cm) : 86.20% N/A N/A % Reduction in A rea: 90.80% N/A N/A % Reduction in  Volume: Grade 1 N/A N/A Classification: Medium N/A N/A Exudate A mount: Serosanguineous N/A N/A Exudate Type: red, brown N/A N/A Exudate Color: Epibole N/A N/A Wound Margin: Large (67-100%) N/A N/A Granulation A mount: Red, Pink N/A N/A Granulation Quality: None Present (0%) N/A N/A Necrotic A mount: Fat Layer (Subcutaneous Tissue): Yes N/A N/A Exposed Structures: Fascia: No Tendon: No Muscle: No Joint: No Bone: No Small (1-33%) N/A N/A Epithelialization: Debridement - Selective/Open Wound N/A N/A Debridement: Pre-procedure Verification/Time Out 13:20  N/A N/A Taken: Lidocaine 4% Topical Solution N/A N/A Pain Control: [name], [name] ([id number]) [id number] Page 3 of 6 Slough N/A N/A Tissue Debrided: Skin/Epidermis N/A N/A Level: 0.44 N/A N/A Debridement A (sq cm): rea Curette N/A N/A Instrument: Minimum N/A N/A Bleeding: Pressure N/A N/A Hemostasis A chieved: 0 N/A N/A Procedural Pain: 0 N/A N/A Post Procedural Pain: Procedure was tolerated well N/A N/A Debridement Treatment Response: 1.4x0.4x0.2 N/A N/A Post Debridement Measurements L x W x D (cm) 0.088 N/A N/A Post Debridement Volume: (cm) Excoriation: No N/A N/A Periwound Skin Texture: Induration: No Callus: No Crepitus: No Rash: No Scarring: No Maceration: No N/A N/A Periwound Skin Moisture: Dry/Scaly: No Atrophie Blanche: No N/A N/A Periwound Skin Color: Cyanosis: No Ecchymosis: No Erythema: No Hemosiderin Staining: No Mottled: No Pallor: No Rubor: No No Abnormality N/A N/A Temperature: Debridement N/A N/A Procedures Performed: Treatment Notes Electronic Signature(s) Signed: 08/23/2022 3:45:24 PM By: [name] Corwin DO Entered By: [name] on 08/23/2022 13:47:01 -------------------------------------------------------------------------------- Multi-Disciplinary Care Plan Details Patient Name: Date of Service: [name]  08/23/2022 1:15 PM Medical Record Number: [id number] Patient Account Number: [id number] Date of Birth/Sex: Treating RN: [birth year] (89 y.o. [name] Primary Care [name]: [name] Other Clinician: Referring Naman Spychalski: Treating Jonathon Castelo/Extender: [name] Weeks in Treatment: 7 Active Inactive Wound/Skin Impairment Nursing Diagnoses: Impaired tissue integrity Knowledge deficit related to ulceration/compromised skin integrity Goals: Patient will have a decrease in wound volume by X% from date: (specify in notes) Date Initiated: 07/04/2022 Target Resolution Date: 11/05/2022 Goal Status: Active Patient/caregiver will verbalize understanding of skin care regimen Date Initiated: 07/04/2022 Target Resolution Date: 11/05/2022 Goal Status: Active Ulcer/skin breakdown will have a volume reduction of 30% by week 4 Date Initiated: 07/04/2022 Target Resolution Date: 11/05/2022 Goal Status: Active Ulcer/skin breakdown will have a volume reduction of 50% by week 8 Date Initiated: 07/04/2022 Target Resolution Date: 11/05/2022 Goal Status: Active [name], [name] ([id number]) [id number] Page 4 of 6 Interventions: Assess patient/caregiver ability to obtain necessary supplies Assess patient/caregiver ability to perform ulcer/skin care regimen upon admission and as needed Assess ulceration(s) every visit Notes: Electronic Signature(s) Signed: 08/23/2022 3:42:23 PM By: [name] Stall RN, BSN Entered By: [name] on 08/23/2022 13:27:55 -------------------------------------------------------------------------------- Pain Assessment Details Patient Name: Date of Service: [name] 08/23/2022 1:15 PM Medical Record Number: [id number] Patient Account Number: [id number] Date of Birth/Sex: Treating RN: February 09, 1934 (89 y.o. [name] Primary Care Orey Moure: [name] Other Clinician: Referring Kam Kushnir: Treating  Nema Oatley/Extender: [name] Weeks in Treatment: 7 Active Problems Location of Pain Severity and Description of Pain Patient Has Paino No Site Locations Pain Management and Medication Current Pain Management: Electronic Signature(s) Signed: 08/23/2022 3:42:23 PM By: [name] Stall RN, BSN Entered By: [name] on 08/23/2022 13:15:06 -------------------------------------------------------------------------------- Patient/Caregiver Education Details Patient Name: Date of Service: [name] 6/18/2024andnbsp1:15 PM Medical Record Number: [id number] Patient Account Number: [id number] Date of Birth/Gender: Treating RN: 1933-10-01 (89 y.o. [name] Primary Care Physician: [name] Other Clinician: Referring Physician: Treating Physician/Extender: [name] [name], [name] ([id number]) [id number] Page 5 of 6 Weeks in Treatment: 7 Education Assessment Education Provided To: Patient Education Topics Provided Wound/Skin Impairment: Handouts: Caring for Your Ulcer Methods: Explain/Verbal Responses: Reinforcements needed Electronic Signature(s) Signed: 08/23/2022 3:42:23 PM By: [name] Stall RN, BSN Entered By: [name] on 08/23/2022 13:28:07 -------------------------------------------------------------------------------- Wound Assessment Details Patient Name: Date of Service: [name] 08/23/2022 1:15 PM Medical Record Number: [id number] Patient  Account Number: [id number] Date of Birth/Sex: Treating RN: 02-26-1934 (89 y.o. [name] Primary Care Verle Wheeling: [name] Other Clinician: Referring Samarth Ogle: Treating [name]/Extender: [name] Weeks in Treatment: 7 Wound Status Wound Number: 3 Primary Diabetic Wound/Ulcer of the Lower Extremity Etiology: Wound Location: Right Amputation Site - Toe Wound Open Wounding Event:  Gradually Appeared Status: Date Acquired: 04/07/2022 Comorbid Cataracts, Anemia, Asthma, Chronic Obstructive Pulmonary Weeks Of Treatment: 7 History: Disease (COPD), Peripheral Arterial Disease, Type II Diabetes, Clustered Wound: No Gout, Osteoarthritis, Neuropathy Photos Wound Measurements Length: (cm) 1.4 Width: (cm) 0.4 Depth: (cm) 0.2 Area: (cm) 0.44 Volume: (cm) 0.088 % Reduction in Area: 86.2% % Reduction in Volume: 90.8% Epithelialization: Small (1-33%) Tunneling: No Undermining: No Wound Description Classification: Grade 1 Wound Margin: Epibole Exudate Amount: Medium Exudate Type: Serosanguineous Exudate Color: red, brown Martinezgarcia, [name] ([id number]) Wound Bed Granulation Amount: Large (67-100%) Granulation Quality: Red, Pink Necrotic Amount: None Present (0%) Foul Odor After Cleansing: No Slough/Fibrino Yes [phone].pdf Page 6 of 6 Exposed Structure Fascia Exposed: No Fat Layer (Subcutaneous Tissue) Exposed: Yes Tendon Exposed: No Muscle Exposed: No Joint Exposed: No Bone Exposed: No Periwound Skin Texture Texture Color No Abnormalities Noted: No No Abnormalities Noted: No Callus: No Atrophie Blanche: No Crepitus: No Cyanosis: No Excoriation: No Ecchymosis: No Induration: No Erythema: No Rash: No Hemosiderin Staining: No Scarring: No Mottled: No Pallor: No Moisture Rubor: No No Abnormalities Noted: No Dry / Scaly: No Temperature / Pain Maceration: No Temperature: No Abnormality Electronic Signature(s) Signed: 08/23/2022 3:42:23 PM By: [name] Stall RN, BSN Entered By: [name] on 08/23/2022 13:20:19 -------------------------------------------------------------------------------- Vitals Details Patient Name: Date of Service: [name] 08/23/2022 1:15 PM Medical Record Number: [id number] Patient Account Number: [id number] Date of Birth/Sex: Treating RN: 1933/09/24 (89 y.o. [name] Primary  Care Inetta Dicke: [name] Other Clinician: Referring Achillies Buehl: Treating Lateia Fraser/Extender: [name] Weeks in Treatment: 7 Vital Signs Time Taken: 13:17 Temperature (F): 98 Pulse (bpm): 93 Respiratory Rate (breaths/min): 20 Blood Pressure (mmHg): 152/84 Capillary Blood Glucose (mg/dl): 010 Reference Range: 80 - 120 mg / dl Electronic Signature(s) Signed: 08/23/2022 3:42:23 PM By: [name] Stall RN, BSN Entered By: [name] on 08/23/2022 13:17:57

## 2022-08-29 ENCOUNTER — Ambulatory Visit (HOSPITAL_BASED_OUTPATIENT_CLINIC_OR_DEPARTMENT_OTHER): Payer: PPO | Admitting: Internal Medicine

## 2022-09-02 ENCOUNTER — Encounter (HOSPITAL_BASED_OUTPATIENT_CLINIC_OR_DEPARTMENT_OTHER): Payer: PPO | Admitting: Internal Medicine

## 2022-09-02 DIAGNOSIS — E11621 Type 2 diabetes mellitus with foot ulcer: Secondary | ICD-10-CM | POA: Diagnosis not present

## 2022-09-02 DIAGNOSIS — L97512 Non-pressure chronic ulcer of other part of right foot with fat layer exposed: Secondary | ICD-10-CM | POA: Diagnosis not present

## 2022-09-02 NOTE — Progress Notes (Signed)
[name], [name] ([id number]) [id number] Page 1 of 8 Visit Report for 09/02/2022 Arrival Information Details Patient Name: Date of Service: [name] 09/02/2022 11:00 A M Medical Record Number: [id number] Patient Account Number: [id number] Date of Birth/Sex: Treating RN: [birth year] (89 y.o. [name] Primary Care [name]: [name] Other Clinician: Referring [name]: Treating [name]/Extender: [name] Weeks in Treatment: 8 Visit Information History Since Last Visit Added or deleted any medications: No Patient Arrived: Ambulatory Any new allergies or adverse reactions: No Arrival Time: 11:05 Had a fall or experienced change in No Accompanied By: family member activities of daily living that may affect Transfer Assistance: None risk of falls: Patient Identification Verified: Yes Signs or symptoms of abuse/neglect since last visito No Secondary Verification Process Completed: Yes Hospitalized since last visit: No Patient Requires Transmission-Based Precautions: No Implantable device outside of the clinic excluding No Patient Has Alerts: Yes cellular tissue based products placed in the center Patient Alerts: Patient on Blood Thinner since last visit: Has Dressing in Place as Prescribed: Yes Pain Present Now: No Electronic Signature(s) Signed: 09/02/2022 2:14:44 PM By: [name] Stall RN, BSN Entered By: [name] on 09/02/2022 11:05:58 -------------------------------------------------------------------------------- Encounter Discharge Information Details Patient Name: Date of Service: [name] 09/02/2022 11:00 A M Medical Record Number: [id number] Patient Account Number: [id number] Date of Birth/Sex: Treating RN: 1933/04/11 (89 y.o. [name] Primary Care [name]: [name] Other Clinician: Referring Shaia Porath: Treating [name]/Extender: [name] Weeks in Treatment: 8 Encounter Discharge Information Items Post Procedure Vitals Discharge Condition: Stable Temperature (F): 98.1 Ambulatory Status: Ambulatory Pulse (bpm): 80 Discharge Destination: Home Respiratory Rate (breaths/min): 20 Transportation: Private Auto Blood Pressure (mmHg): 144/77 Accompanied By: family member Schedule Follow-up Appointment: Yes Clinical Summary of Care: Electronic Signature(s) Signed: 09/02/2022 2:14:44 PM By: [name] Stall RN, BSN Entered By: [name] on 09/02/2022 11:29:52 [name], [name] ([id number]) [id number] Page 2 of 8 -------------------------------------------------------------------------------- Lower Extremity Assessment Details Patient Name: Date of Service: [name] 09/02/2022 11:00 A M Medical Record Number: [id number] Patient Account Number: [id number] Date of Birth/Sex: Treating RN: 07-02-1933 (89 y.o. [name] Primary Care Keishon Chavarin: [name] Other Clinician: Referring [name]: Treating [name]/Extender: [name] Weeks in Treatment: 8 Edema Assessment Assessed: [Left: No] [Right: Yes] Edema: [Left: N] [Right: o] Calf Left: Right: Point of Measurement: From Medial Instep 29 cm Ankle Left: Right: Point of Measurement: From Medial Instep 19 cm Vascular Assessment Pulses: Dorsalis Pedis Palpable: [Right:Yes] Electronic Signature(s) Signed: 09/02/2022 2:14:44 PM By: [name] Stall RN, BSN Entered By: [name] on 09/02/2022 11:06:56 -------------------------------------------------------------------------------- Multi Wound Chart Details Patient Name: Date of Service: [name] 09/02/2022 11:00 A M Medical Record Number: [id number] Patient Account Number: [id number] Date of Birth/Sex: Treating RN: 02-16-1934 (89 y.o. M) Primary Care [name]: [name] Other Clinician: Referring [name]: Treating  [name]/Extender: [name] Weeks in Treatment: 8 Vital Signs Height(in): Capillary Blood Glucose(mg/dl): 222 Weight(lbs): Pulse(bpm): 80 Body Mass Index(BMI): Blood Pressure(mmHg): 144/77 Temperature(F): 98.1 Respiratory Rate(breaths/min): 20 [3:Photos:] [N/A:N/A] Right Amputation Site - Toe N/A N/A Wound Location: Gradually Appeared N/A N/A Wounding Event: Diabetic Wound/Ulcer of the Lower N/A N/A Primary Etiology: Extremity Cataracts, Anemia, Asthma, Chronic N/A N/A Comorbid History: Obstructive Pulmonary Disease (COPD), Peripheral Arterial Disease, Type II Diabetes, Gout, Osteoarthritis, Neuropathy 04/07/2022 N/A N/A Date Acquired: 8 N/A N/A Weeks of Treatment: Open N/A N/A Wound Status: No N/A N/A Wound Recurrence: 1.4x0.3x0.2 N/A N/A Measurements L x  W x D (cm) 0.33 N/A N/A A (cm) : rea 0.066 N/A N/A Volume (cm) : 89.60% N/A N/A % Reduction in A rea: 93.10% N/A N/A % Reduction in Volume: Grade 1 N/A N/A Classification: Medium N/A N/A Exudate A mount: Serosanguineous N/A N/A Exudate Type: red, brown N/A N/A Exudate Color: Epibole N/A N/A Wound Margin: Large (67-100%) N/A N/A Granulation A mount: Red, Pink N/A N/A Granulation Quality: None Present (0%) N/A N/A Necrotic A mount: Fat Layer (Subcutaneous Tissue): Yes N/A N/A Exposed Structures: Fascia: No Tendon: No Muscle: No Joint: No Bone: No Medium (34-66%) N/A N/A Epithelialization: Debridement - Excisional N/A N/A Debridement: Pre-procedure Verification/Time Out 11:20 N/A N/A Taken: Lidocaine 4% Topical Solution N/A N/A Pain Control: Subcutaneous, Slough N/A N/A Tissue Debrided: Skin/Subcutaneous Tissue N/A N/A Level: 0.31 N/A N/A Debridement A (sq cm): rea Curette N/A N/A Instrument: Minimum N/A N/A Bleeding: Pressure N/A N/A Hemostasis A chieved: 0 N/A N/A Procedural Pain: 0 N/A N/A Post Procedural Pain: Procedure was tolerated well N/A  N/A Debridement Treatment Response: 1.3x0.3x0.2 N/A N/A Post Debridement Measurements L x W x D (cm) 0.061 N/A N/A Post Debridement Volume: (cm) Excoriation: No N/A N/A Periwound Skin Texture: Induration: No Callus: No Crepitus: No Rash: No Scarring: No Maceration: No N/A N/A Periwound Skin Moisture: Dry/Scaly: No Atrophie Blanche: No N/A N/A Periwound Skin Color: Cyanosis: No Ecchymosis: No Erythema: No Hemosiderin Staining: No Mottled: No Pallor: No Rubor: No No Abnormality N/A N/A Temperature: Debridement N/A N/A Procedures Performed: Treatment Notes Wound #3 (Amputation Site - Toe) Wound Laterality: Right Cleanser Vashe 5.8 (oz) Discharge Instruction: Cleanse the wound with Vashe prior to applying a clean dressing using gauze sponges, not tissue or cotton balls. Peri-Wound Care Topical Blastex Primary Dressing Promogran Prisma Matrix, 4.34 (sq in) (silver collagen) Discharge Instruction: Moisten collagen with saline or hydrogel [name], [name] ([id number]) [phone].pdf Page 4 of 8 Secondary Dressing Woven Gauze Sponge, Non-Sterile 4x4 in Discharge Instruction: Apply over primary dressing as directed. Secured With Paper Tape, 2x10 (in/yd) Discharge Instruction: Secure dressing with tape as directed. Compression Wrap Compression Stockings Add-Ons Electronic Signature(s) Signed: 09/02/2022 11:46:38 AM By: [name] DO Entered By: [name] on 09/02/2022 11:34:58 -------------------------------------------------------------------------------- Multi-Disciplinary Care Plan Details Patient Name: Date of Service: [name] 09/02/2022 11:00 A M Medical Record Number: [id number] Patient Account Number: [id number] Date of Birth/Sex: Treating RN: [birth year] (89 y.o. [name] Primary Care Ashunti Schofield: [name] Other Clinician: Referring Kalon Erhardt: Treating Tesneem Dufrane/Extender: [name] Weeks in Treatment: 8 Active Inactive Wound/Skin Impairment Nursing Diagnoses: Impaired tissue integrity Knowledge deficit related to ulceration/compromised skin integrity Goals: Patient will have a decrease in wound volume by X% from date: (specify in notes) Date Initiated: 07/04/2022 Target Resolution Date: 11/05/2022 Goal Status: Active Patient/caregiver will verbalize understanding of skin care regimen Date Initiated: 07/04/2022 Target Resolution Date: 11/05/2022 Goal Status: Active Ulcer/skin breakdown will have a volume reduction of 30% by week 4 Date Initiated: 07/04/2022 Target Resolution Date: 11/05/2022 Goal Status: Active Ulcer/skin breakdown will have a volume reduction of 50% by week 8 Date Initiated: 07/04/2022 Target Resolution Date: 11/05/2022 Goal Status: Active Interventions: Assess patient/caregiver ability to obtain necessary supplies Assess patient/caregiver ability to perform ulcer/skin care regimen upon admission and as needed Assess ulceration(s) every visit Notes: Electronic Signature(s) Signed: 09/02/2022 2:14:44 PM By: [name] Stall RN, BSN Entered By: [name] on 09/02/2022 11:12:44 [name], [name] ([id number]) [id number] Page 5 of 8 -------------------------------------------------------------------------------- Pain Assessment Details Patient Name: Date of Service: [name] RDSO  Marca Ancona [name] 09/02/2022 11:00 A M Medical Record Number: [id number] Patient Account Number: [id number] Date of Birth/Sex: Treating RN: [birth year] (89 y.o. [name] Primary Care Charlese Gruetzmacher: [name] Other Clinician: Referring Donatello Kleve: Treating Gustabo Gordillo/Extender: [name] Weeks in Treatment: 8 Active Problems Location of Pain Severity and Description of Pain Patient Has Paino No Site Locations Pain Management and Medication Current Pain Management: Notes Per patient pain at night. Electronic  Signature(s) Signed: 09/02/2022 2:14:44 PM By: [name] Stall RN, BSN Entered By: [name] on 09/02/2022 11:06:09 -------------------------------------------------------------------------------- Patient/Caregiver Education Details Patient Name: Date of Service: [name] 6/28/2024andnbsp11:00 A M Medical Record Number: [id number] Patient Account Number: [id number] Date of Birth/Gender: Treating RN: 07-22-1933 (89 y.o. [name] Primary Care Physician: [name] Other Clinician: Referring Physician: Treating Physician/Extender: Candace Gallus in Treatment: 8 Education Assessment Education Provided To: Patient Education Topics Provided Wound/Skin ImpairmentYahel Liming, Evelyn ([id number]) [id number] Page 6 of 8 Handouts: Caring for Your Ulcer Methods: Explain/Verbal Responses: Reinforcements needed Electronic Signature(s) Signed: 09/02/2022 2:14:44 PM By: [name] Stall RN, BSN Entered By: [name] on 09/02/2022 11:12:54 -------------------------------------------------------------------------------- Wound Assessment Details Patient Name: Date of Service: [name] 09/02/2022 11:00 A M Medical Record Number: [id number] Patient Account Number: [id number] Date of Birth/Sex: Treating RN: 10-15-1933 (89 y.o. [name] Primary Care Daniel Johndrow: [name] Other Clinician: Referring Jozef Eisenbeis: Treating Caius Silbernagel/Extender: [name] Weeks in Treatment: 8 Wound Status Wound Number: 3 Primary Diabetic Wound/Ulcer of the Lower Extremity Etiology: Wound Location: Right Amputation Site - Toe Wound Open Wounding Event: Gradually Appeared Status: Date Acquired: 04/07/2022 Comorbid Cataracts, Anemia, Asthma, Chronic Obstructive Pulmonary Weeks Of Treatment: 8 History: Disease (COPD), Peripheral Arterial Disease, Type II Diabetes, Clustered Wound: No Gout,  Osteoarthritis, Neuropathy Photos Wound Measurements Length: (cm) 1.4 Width: (cm) 0.3 Depth: (cm) 0.2 Area: (cm) 0.33 Volume: (cm) 0.066 % Reduction in Area: 89.6% % Reduction in Volume: 93.1% Epithelialization: Medium (34-66%) Tunneling: No Undermining: No Wound Description Classification: Grade 1 Wound Margin: Epibole Exudate Amount: Medium Exudate Type: Serosanguineous Exudate Color: red, brown Foul Odor After Cleansing: No Slough/Fibrino Yes Wound Bed Granulation Amount: Large (67-100%) Exposed Structure Granulation Quality: Red, Pink Fascia Exposed: No Necrotic Amount: None Present (0%) Fat Layer (Subcutaneous Tissue) Exposed: Yes Tendon Exposed: No Muscle Exposed: No Joint Exposed: No Bone Exposed: No Periwound Skin Texture [name], [name] ([id number]) [phone].pdf Page 7 of 8 Texture Color No Abnormalities Noted: No No Abnormalities Noted: No Callus: No Atrophie Blanche: No Crepitus: No Cyanosis: No Excoriation: No Ecchymosis: No Induration: No Erythema: No Rash: No Hemosiderin Staining: No Scarring: No Mottled: No Pallor: No Moisture Rubor: No No Abnormalities Noted: No Dry / Scaly: No Temperature / Pain Maceration: No Temperature: No Abnormality Treatment Notes Wound #3 (Amputation Site - Toe) Wound Laterality: Right Cleanser Vashe 5.8 (oz) Discharge Instruction: Cleanse the wound with Vashe prior to applying a clean dressing using gauze sponges, not tissue or cotton balls. Peri-Wound Care Topical Blastex Primary Dressing Promogran Prisma Matrix, 4.34 (sq in) (silver collagen) Discharge Instruction: Moisten collagen with saline or hydrogel Secondary Dressing Woven Gauze Sponge, Non-Sterile 4x4 in Discharge Instruction: Apply over primary dressing as directed. Secured With Paper Tape, 2x10 (in/yd) Discharge Instruction: Secure dressing with tape as directed. Compression Wrap Compression  Stockings Add-Ons Electronic Signature(s) Signed: 09/02/2022 2:14:44 PM By: [name] Stall RN, BSN Entered By: [name] on 09/02/2022 11:11:15 -------------------------------------------------------------------------------- Vitals Details Patient Name: Date of Service: [name], [name] 09/02/2022  11:00 A M Medical Record Number: [id number] Patient Account Number: [id number] Date of Birth/Sex: Treating RN: October 02, 1933 (89 y.o. [name] Primary Care Jinnifer Montejano: [name] Other Clinician: Referring Lynsey Ange: Treating Suzan Manon/Extender: [name] Weeks in Treatment: 8 Vital Signs Time Taken: 11:08 Temperature (F): 98.1 Pulse (bpm): 80 Respiratory Rate (breaths/min): 20 Blood Pressure (mmHg): 144/77 Capillary Blood Glucose (mg/dl): 562 Reference Range: 80 - 120 mg / dl Electronic Signature(s) Signed: 09/02/2022 2:14:44 PM By: [name] Stall RN, BSN [name], [name] 2:14:44 PM By: [name] Stall RN, BSN Signed: 09/02/2022 ([id number]) [phone].pdf Page 8 of 8 Entered By: [name] on 09/02/2022 11:08:23

## 2022-09-02 NOTE — Progress Notes (Signed)
[name], [name] ([id number]) [id number] Page 1 of 9 Visit Report for 09/02/2022 Chief Complaint Document Details Patient Name: Date of Service: [name] 09/02/2022 11:00 A M Medical Record Number: [id number] Patient Account Number: [id number] Date of Birth/Sex: Treating RN: 1933/05/13 (89 y.o. M) Primary Care Provider: Franchot Erichsen Other Clinician: Referring Provider: Treating Provider/Extender: Newman Pies Weeks in Treatment: 8 Information Obtained from: Patient Chief Complaint 07/04/2022; right foot wound Electronic Signature(s) Signed: 09/02/2022 11:46:38 AM By: [name] DO Entered By: [name] on 09/02/2022 11:35:05 -------------------------------------------------------------------------------- Debridement Details Patient Name: Date of Service: [name] 09/02/2022 11:00 A M Medical Record Number: [id number] Patient Account Number: [id number] Date of Birth/Sex: Treating RN: 1933/06/17 (89 y.o. [name], [name] Primary Care Provider: Franchot Erichsen Other Clinician: Referring Provider: Treating Provider/Extender: Newman Pies Weeks in Treatment: 8 Debridement Performed for Assessment: Wound #3 Right Amputation Site - Toe Performed By: Physician [name], DO Debridement Type: Debridement Severity of Tissue Pre Debridement: Fat layer exposed Level of Consciousness (Pre-procedure): Awake and Alert Pre-procedure Verification/Time Out Yes - 11:20 Taken: Start Time: 11:21 Pain Control: Lidocaine 4% T opical Solution Percent of Wound Bed Debrided: 100% T Area Debrided (cm): otal 0.31 Tissue and other material debrided: Viable, Non-Viable, Slough, Subcutaneous, Skin: Dermis , Skin: Epidermis, Slough Level: Skin/Subcutaneous Tissue Debridement Description: Excisional Instrument: Curette Bleeding: Minimum Hemostasis Achieved: Pressure End Time:  11:26 Procedural Pain: 0 Post Procedural Pain: 0 Response to Treatment: Procedure was tolerated well Level of Consciousness (Post- Awake and Alert procedure): Post Debridement Measurements of Total Wound Length: (cm) 1.3 Width: (cm) 0.3 Depth: (cm) 0.2 Volume: (cm) 0.061 Character of Wound/Ulcer Post Debridement: Improved [name], [name] ([id number]) [id number] Page 2 of 9 Severity of Tissue Post Debridement: Fat layer exposed Post Procedure Diagnosis Same as Pre-procedure Electronic Signature(s) Signed: 09/02/2022 11:46:38 AM By: [name] DO Signed: 09/02/2022 2:14:44 PM By: Shawn Stall RN, BSN Entered By: Shawn Stall on 09/02/2022 11:26:24 -------------------------------------------------------------------------------- HPI Details Patient Name: Date of Service: [name] 09/02/2022 11:00 A M Medical Record Number: [id number] Patient Account Number: [id number] Date of Birth/Sex: Treating RN: June 23, 1933 (89 y.o. M) Primary Care Provider: Franchot Erichsen Other Clinician: Referring Provider: Treating Provider/Extender: Newman Pies Weeks in Treatment: 8 History of Present Illness HPI Description: 07/04/2022 [name] is an 89 year old male with a past medical history of uncontrolled, insulin dependent type 2 diabetes, CKD stage III, and COPD that presents to the clinic for a right foot wound. He has an extensive history of peripheral arterial disease to the right leg. He has had a right superficial femoral to posterior tibial artery bypass by Dr. Elonda Husky in 2020. He had a right common femoral to plantar posterior tibial artery bypass on 02/03/2022 by Dr. Lenell Antu and at that time he also had a right toe amputation. He states this wound site had healed completely. Prior to this he had amputations to the first and second digit to the right foot. He presents today with an open wound to the previous  right toe amputation site for the past 2 months. He has been following with vein and vascular for this issue. Most recent ABIs were on 06/22/2022 that showed absent waveforms in the right lower extremity. Currently his right lower extremity bypass is occluded and no plan for another anteriorgram and has he likely has no revascularization options. Currently he has been using Betadine to the wound bed. He denies signs of infection. 07/11/2022 patient arrives  in clinic today using Hydrofera Blue on the toe amputation site. The patient is revascularized and is not felt to have any other options for revascularization. We are using Hydrofera Blue and gauze. 5/13; patient presents for follow-up. He has been using Hydrofera Blue to the toe amputation site. He has had no issues. Wound is smaller. He has been approved for [facility] and was agreeable to have this placed in office today. 5/20; patient presents for follow-up. We have been using Kerecis to the wound bed. There is been improvement in healing. Unfortunately patient did not change the outer dressing for a week. There is slight maceration to the periwound. Unclear why he did not do this. 5/30; patient presents for follow-up. We have been using Kerecis to the wound bed. More maceration to the periwound today. No signs of infection. 6/10; patient presents for follow-up. He has been using Hydrofera Blue to the wound bed. He has no issues or complaints today. Maceration has almost completely resolved. 6/18; patient presents for follow-up. He has been using blast X and collagen to the wound bed without issues. Wound appears smaller today. 6/28; patient presents for follow-up. He has been using blast X and collagen to the wound bed. The wound is smaller. Electronic Signature(s) Signed: 09/02/2022 11:46:38 AM By: [name] DO Entered By: [name] on 09/02/2022  11:36:06 -------------------------------------------------------------------------------- Physical Exam Details Patient Name: Date of Service: [name] 09/02/2022 11:00 A M Medical Record Number: [id number] Patient Account Number: [id number] Date of Birth/Sex: Treating RN: Sep 10, 1933 (89 y.o. M) Primary Care Provider: Franchot Erichsen Other Clinician: Referring Provider: Treating Provider/Extender: Candace Gallus in Treatment: [address] [place] ([id number]) [id number] Page 3 of 9 Constitutional respirations regular, non-labored and within target range for patient.. Cardiovascular 2+ dorsalis pedis/posterior tibialis pulses. Psychiatric pleasant and cooperative. Notes Third toe amputation site with granulation tissue and slough accumulation. No signs of surrounding infection. Electronic Signature(s) Signed: 09/02/2022 11:46:38 AM By: [name] DO Entered By: [name] on 09/02/2022 11:36:27 -------------------------------------------------------------------------------- Physician Orders Details Patient Name: Date of Service: [name] 09/02/2022 11:00 A M Medical Record Number: [id number] Patient Account Number: [id number] Date of Birth/Sex: Treating RN: 01-04-1934 (89 y.o. [name] Primary Care Provider: Franchot Erichsen Other Clinician: Referring Provider: Treating Provider/Extender: Newman Pies Weeks in Treatment: 8 Verbal / Phone Orders: No Diagnosis Coding ICD-10 Coding Code Description [phone] Non-pressure chronic ulcer of other part of right foot with fat layer exposed E11.621 Type 2 diabetes mellitus with foot ulcer I70.235 Atherosclerosis of native arteries of right leg with ulceration of other part of foot I73.9 Peripheral vascular disease, unspecified Follow-up Appointments ppointment in 2 weeks. - Dr. Mikey Bussing 2pm 09/15/2022 room 9  Thursday Return A Other: - [employer] blastx and collagen- jennifer is the rep. ****Call [facility] to return the wound care package.***** Anesthetic (In clinic) Topical Lidocaine 5% applied to wound bed Bathing/ Shower/ Hygiene May shower with protection but do not get wound dressing(s) wet. Protect dressing(s) with water repellant cover (for example, large plastic bag) or a cast cover and may then take shower. Edema Control - Lymphedema / SCD / Other Elevate legs to the level of the heart or above for 30 minutes daily and/or when sitting for 3-4 times a day throughout the day. Avoid standing for long periods of time. Wound Treatment Wound #3 - Amputation Site - Toe Wound Laterality: Right Cleanser: Vashe 5.8 (oz) 1 x Per Day/30 Days Discharge Instructions: Cleanse the  wound with Vashe prior to applying a clean dressing using gauze sponges, not tissue or cotton balls. Topical: Blastex (Generic) 1 x Per Day/30 Days Prim Dressing: Promogran Prisma Matrix, 4.34 (sq in) (silver collagen) (Generic) 1 x Per Day/30 Days ary Discharge Instructions: Moisten collagen with saline or hydrogel Secondary Dressing: Woven Gauze Sponge, Non-Sterile 4x4 in (Generic) 1 x Per Day/30 Days Discharge Instructions: Apply over primary dressing as directed. Secured With: Paper Tape, 2x10 (in/yd) 1 x Per Day/30 Days Discharge Instructions: Secure dressing with tape as directed. SICHER, Lennart ([id number]) [id number] Page 4 of 9 Electronic Signature(s) Signed: 09/02/2022 11:46:38 AM By: [name] DO Entered By: [name] on 09/02/2022 11:36:36 -------------------------------------------------------------------------------- Problem List Details Patient Name: Date of Service: [name] 09/02/2022 11:00 A M Medical Record Number: [id number] Patient Account Number: [id number] Date of Birth/Sex: Treating RN: Jan 03, 1934 (89 y.o. [name] Primary Care  Provider: Franchot Erichsen Other Clinician: Referring Provider: Treating Provider/Extender: Newman Pies Weeks in Treatment: 8 Active Problems ICD-10 Encounter Code Description Active Date MDM Diagnosis L97.512 Non-pressure chronic ulcer of other part of right foot with fat layer exposed 07/04/2022 No Yes E11.621 Type 2 diabetes mellitus with foot ulcer 07/04/2022 No Yes I70.235 Atherosclerosis of native arteries of right leg with ulceration of other part of 07/04/2022 No Yes foot I73.9 Peripheral vascular disease, unspecified 07/04/2022 No Yes Inactive Problems Resolved Problems Electronic Signature(s) Signed: 09/02/2022 11:46:38 AM By: [name] DO Entered By: [name] on 09/02/2022 11:34:52 -------------------------------------------------------------------------------- Progress Note Details Patient Name: Date of Service: [name] 09/02/2022 11:00 A M Medical Record Number: [id number] Patient Account Number: [id number] Date of Birth/Sex: Treating RN: 03-01-1934 (89 y.o. M) Primary Care Provider: Franchot Erichsen Other Clinician: Referring Provider: Treating Provider/Extender: Candace Gallus in Treatment: 8 Subjective [name], [name] ([id number]) [id number] Page 5 of 9 Chief Complaint Information obtained from Patient 07/04/2022; right foot wound History of Present Illness (HPI) 07/04/2022 Mr. Koleman Mckinnies is an 89 year old male with a past medical history of uncontrolled, insulin dependent type 2 diabetes, CKD stage III, and COPD that presents to the clinic for a right foot wound. He has an extensive history of peripheral arterial disease to the right leg. He has had a right superficial femoral to posterior tibial artery bypass by Dr. Elonda Husky in 2020. He had a right common femoral to plantar posterior tibial artery bypass on 02/03/2022 by Dr. Lenell Antu and at that time he also  had a right toe amputation. He states this wound site had healed completely. Prior to this he had amputations to the first and second digit to the right foot. He presents today with an open wound to the previous right toe amputation site for the past 2 months. He has been following with vein and vascular for this issue. Most recent ABIs were on 06/22/2022 that showed absent waveforms in the right lower extremity. Currently his right lower extremity bypass is occluded and no plan for another anteriorgram and has he likely has no revascularization options. Currently he has been using Betadine to the wound bed. He denies signs of infection. 07/11/2022 patient arrives in clinic today using Hydrofera Blue on the toe amputation site. The patient is revascularized and is not felt to have any other options for revascularization. We are using Hydrofera Blue and gauze. 5/13; patient presents for follow-up. He has been using Hydrofera Blue to the toe amputation site. He has had no issues. Wound is smaller. He has been approved  for [facility] and was agreeable to have this placed in office today. 5/20; patient presents for follow-up. We have been using Kerecis to the wound bed. There is been improvement in healing. Unfortunately patient did not change the outer dressing for a week. There is slight maceration to the periwound. Unclear why he did not do this. 5/30; patient presents for follow-up. We have been using Kerecis to the wound bed. More maceration to the periwound today. No signs of infection. 6/10; patient presents for follow-up. He has been using Hydrofera Blue to the wound bed. He has no issues or complaints today. Maceration has almost completely resolved. 6/18; patient presents for follow-up. He has been using blast X and collagen to the wound bed without issues. Wound appears smaller today. 6/28; patient presents for follow-up. He has been using blast X and collagen to the wound bed. The wound is  smaller. Patient History Information obtained from Patient. Family History Cancer - Siblings, Diabetes - Siblings,Child, Heart Disease - Siblings, Hypertension - Father,Siblings, Lung Disease - Siblings, Stroke - Siblings, No family history of Hereditary Spherocytosis, Kidney Disease, Seizures, Thyroid Problems, Tuberculosis. Social History Former smoker - quit 10 yrs ago, Marital Status - Married, Alcohol Use - Never, Drug Use - No History, Caffeine Use - Daily - coffee. Medical History Eyes Patient has history of Cataracts - removed both eyes Denies history of Glaucoma, Optic Neuritis Ear/Nose/Mouth/Throat Denies history of Chronic sinus problems/congestion, Middle ear problems Hematologic/Lymphatic Patient has history of Anemia Denies history of Hemophilia, Human Immunodeficiency Virus, Lymphedema, Sickle Cell Disease Respiratory Patient has history of Asthma, Chronic Obstructive Pulmonary Disease (COPD) Denies history of Aspiration, Pneumothorax, Sleep Apnea, Tuberculosis Cardiovascular Patient has history of Peripheral Arterial Disease Endocrine Patient has history of Type II Diabetes - prediabetes Denies history of Type I Diabetes Genitourinary Denies history of End Stage Renal Disease Integumentary (Skin) Denies history of History of Burn Musculoskeletal Patient has history of Gout, Osteoarthritis Denies history of Rheumatoid Arthritis, Osteomyelitis Neurologic Patient has history of Neuropathy Oncologic Denies history of Received Chemotherapy, Received Radiation Psychiatric Denies history of Anorexia/bulimia, Confinement Anxiety Hospitalization/Surgery History - cholecystectomy. Medical A Surgical History Notes nd Ear/Nose/Mouth/Throat hard of hearing [name], [name] ([id number]) [id number] Page 6 of 9 Objective Constitutional respirations regular, non-labored and within target range for patient.. Vitals Time Taken: 11:08 AM,  Temperature: 98.1 F, Pulse: 80 bpm, Respiratory Rate: 20 breaths/min, Blood Pressure: 144/77 mmHg, Capillary Blood Glucose: 119 mg/dl. Cardiovascular 2+ dorsalis pedis/posterior tibialis pulses. Psychiatric pleasant and cooperative. General Notes: Third toe amputation site with granulation tissue and slough accumulation. No signs of surrounding infection. Integumentary (Hair, Skin) Wound #3 status is Open. Original cause of wound was Gradually Appeared. The date acquired was: 04/07/2022. The wound has been in treatment 8 weeks. The wound is located on the Right Amputation Site - T The wound measures 1.4cm length x 0.3cm width x 0.2cm depth; 0.33cm^2 area and 0.066cm^3 volume. oe. There is Fat Layer (Subcutaneous Tissue) exposed. There is no tunneling or undermining noted. There is a medium amount of serosanguineous drainage noted. The wound margin is epibole. There is large (67-100%) red, pink granulation within the wound bed. There is no necrotic tissue within the wound bed. The periwound skin appearance did not exhibit: Callus, Crepitus, Excoriation, Induration, Rash, Scarring, Dry/Scaly, Maceration, Atrophie Blanche, Cyanosis, Ecchymosis, Hemosiderin Staining, Mottled, Pallor, Rubor, Erythema. Periwound temperature was noted as No Abnormality. Assessment Active Problems ICD-10 Non-pressure chronic ulcer of other part of right foot with fat layer exposed Type 2 diabetes  mellitus with foot ulcer Atherosclerosis of native arteries of right leg with ulceration of other part of foot Peripheral vascular disease, unspecified Patient's wound has shown improvement in size in appearance since last clinic visit. I debrided nonviable tissue. I recommended continuing the course with blast X and collagen. Follow-up in 2 weeks. Procedures Wound #3 Pre-procedure diagnosis of Wound #3 is a Diabetic Wound/Ulcer of the Lower Extremity located on the Right Amputation Site - T .Severity of Tissue  Pre oe Debridement is: Fat layer exposed. There was a Excisional Skin/Subcutaneous Tissue Debridement with a total area of 0.31 sq cm performed by [name], DO. With the following instrument(s): Curette to remove Viable and Non-Viable tissue/material. Material removed includes Subcutaneous Tissue, Slough, Skin: Dermis, and Skin: Epidermis after achieving pain control using Lidocaine 4% Topical Solution. A time out was conducted at 11:20, prior to the start of the procedure. A Minimum amount of bleeding was controlled with Pressure. The procedure was tolerated well with a pain level of 0 throughout and a pain level of 0 following the procedure. Post Debridement Measurements: 1.3cm length x 0.3cm width x 0.2cm depth; 0.061cm^3 volume. Character of Wound/Ulcer Post Debridement is improved. Severity of Tissue Post Debridement is: Fat layer exposed. Post procedure Diagnosis Wound #3: Same as Pre-Procedure Plan Follow-up Appointments: Return Appointment in 2 weeks. - Dr. Mikey Bussing 2pm 09/15/2022 room 9 Thursday Other: - [employer] blastx and collagen- jennifer is the rep. ****Call [facility] to return the wound care package.***** Anesthetic: (In clinic) Topical Lidocaine 5% applied to wound bed Bathing/ Shower/ Hygiene: May shower with protection but do not get wound dressing(s) wet. Protect dressing(s) with water repellant cover (for example, large plastic bag) or a cast cover and may then take shower. Edema Control - Lymphedema / SCD / Other: Elevate legs to the level of the heart or above for 30 minutes daily and/or when sitting for 3-4 times a day throughout the day. Avoid standing for long periods of time. WOUND #3: - Amputation Site - T oe Wound Laterality: Right Cleanser: Vashe 5.8 (oz) 1 x Per Day/30 Days Discharge Instructions: Cleanse the wound with Vashe prior to applying a clean dressing using gauze sponges, not tissue or cotton balls. Topical: Blastex (Generic) 1 x Per Day/30  Days Prim Dressing: Promogran Prisma Matrix, 4.34 (sq in) (silver collagen) (Generic) 1 x Per Day/30 Days ary Discharge Instructions: Moisten collagen with saline or hydrogel Secondary Dressing: Woven Gauze Sponge, Non-Sterile 4x4 in (Generic) 1 x Per Day/30 Days Discharge Instructions: Apply over primary dressing as directed. Secured With: Paper T ape, 2x10 (in/yd) 1 x Per Day/30 Days [name], [name] ([id number]) [phone].pdf Page 7 of 9 Discharge Instructions: Secure dressing with tape as directed. 1. In office sharp debridement 2. Blast X and collagen 3. Follow-up in 2 weeks Electronic Signature(s) Signed: 09/02/2022 11:46:38 AM By: [name] DO Entered By: [name] on 09/02/2022 11:37:46 -------------------------------------------------------------------------------- HxROS Details Patient Name: Date of Service: [name] 09/02/2022 11:00 A M Medical Record Number: [id number] Patient Account Number: [id number] Date of Birth/Sex: Treating RN: 1933-03-24 (89 y.o. M) Primary Care Provider: Franchot Erichsen Other Clinician: Referring Provider: Treating Provider/Extender: Newman Pies Weeks in Treatment: 8 Information Obtained From Patient Eyes Medical History: Positive for: Cataracts - removed both eyes Negative for: Glaucoma; Optic Neuritis Ear/Nose/Mouth/Throat Medical History: Negative for: Chronic sinus problems/congestion; Middle ear problems Past Medical History Notes: hard of hearing Hematologic/Lymphatic Medical History: Positive for: Anemia Negative for: Hemophilia; Human Immunodeficiency Virus; Lymphedema; Sickle  Cell Disease Respiratory Medical History: Positive for: Asthma; Chronic Obstructive Pulmonary Disease (COPD) Negative for: Aspiration; Pneumothorax; Sleep Apnea; Tuberculosis Cardiovascular Medical History: Positive for: Peripheral Arterial Disease Endocrine Medical  History: Positive for: Type II Diabetes - prediabetes Negative for: Type I Diabetes Time with diabetes: 3 yrs ago Treated with: Diet Blood sugar tested every day: No Genitourinary Medical History: Negative for: End Stage Renal Disease [name], [name] ([id number]) [id number] Page 8 of 9 Integumentary (Skin) Medical History: Negative for: History of Burn Musculoskeletal Medical History: Positive for: Gout; Osteoarthritis Negative for: Rheumatoid Arthritis; Osteomyelitis Neurologic Medical History: Positive for: Neuropathy Oncologic Medical History: Negative for: Received Chemotherapy; Received Radiation Psychiatric Medical History: Negative for: Anorexia/bulimia; Confinement Anxiety HBO Extended History Items Eyes: Cataracts Immunizations Pneumococcal Vaccine: Received Pneumococcal Vaccination: Yes Received Pneumococcal Vaccination On or After 60th Birthday: Yes Implantable Devices None Hospitalization / Surgery History Type of Hospitalization/Surgery cholecystectomy Family and Social History Cancer: Yes - Siblings; Diabetes: Yes - Siblings,Child; Heart Disease: Yes - Siblings; Hereditary Spherocytosis: No; Hypertension: Yes - Father,Siblings; Kidney Disease: No; Lung Disease: Yes - Siblings; Seizures: No; Stroke: Yes - Siblings; Thyroid Problems: No; Tuberculosis: No; Former smoker - quit 10 yrs ago; Marital Status - Married; Alcohol Use: Never; Drug Use: No History; Caffeine Use: Daily - coffee; Financial Concerns: No; Food, Clothing or Shelter Needs: No; Support System Lacking: No; Transportation Concerns: No Electronic Signature(s) Signed: 09/02/2022 11:46:38 AM By: [name] DO Entered By: [name] on 09/02/2022 11:36:11 -------------------------------------------------------------------------------- SuperBill Details Patient Name: Date of Service: [name] 09/02/2022 Medical Record Number:  [id number] Patient Account Number: [id number] Date of Birth/Sex: Treating RN: 1933/08/20 (89 y.o. [name] Primary Care Provider: Franchot Erichsen Other Clinician: Referring Provider: Treating Provider/Extender: Newman Pies Weeks in Treatment: 8 Diagnosis Coding ICD-10 Codes [place], Mckenna ([id number]) [id number] Page 9 of 9 Code Description L97.512 Non-pressure chronic ulcer of other part of right foot with fat layer exposed E11.621 Type 2 diabetes mellitus with foot ulcer I70.235 Atherosclerosis of native arteries of right leg with ulceration of other part of foot I73.9 Peripheral vascular disease, unspecified Facility Procedures : CPT4 Code: [id number] 1 Description: 1042 - DEB SUBQ TISSUE 20 SQ CM/< ICD-10 Diagnosis Description L97.512 Non-pressure chronic ulcer of other part of right foot with fat layer exposed E11.621 Type 2 diabetes mellitus with foot ulcer Modifier: Quantity: 1 Physician Procedures : CPT4 Code Description Modifier [id number] 11042 - WC PHYS SUBQ TISS 20 SQ CM ICD-10 Diagnosis Description L97.512 Non-pressure chronic ulcer of other part of right foot with fat layer exposed E11.621 Type 2 diabetes mellitus with foot ulcer Quantity: 1 Electronic Signature(s) Signed: 09/02/2022 11:46:38 AM By: [name] DO Entered By: [name] on 09/02/2022 11:37:55

## 2022-09-06 ENCOUNTER — Ambulatory Visit (INDEPENDENT_AMBULATORY_CARE_PROVIDER_SITE_OTHER): Payer: PPO | Admitting: *Deleted

## 2022-09-06 DIAGNOSIS — Z0000 Encounter for general adult medical examination without abnormal findings: Secondary | ICD-10-CM

## 2022-09-06 NOTE — Patient Instructions (Signed)
[name] , Thank you for taking time to come for your Medicare Wellness Visit. I appreciate your ongoing commitment to your health goals. Please review the following plan we discussed and let me know if I can assist you in the future.   Screening recommendations/referrals: Colonoscopy: no longer required Recommended yearly ophthalmology/optometry visit for glaucoma screening and checkup Recommended yearly dental visit for hygiene and checkup  Vaccinations: Influenza vaccine: up to date Pneumococcal vaccine: up tod ate Tdap vaccine: Education provided Shingles vaccine: Education provided    Advanced directives: Education provided    Preventive Care 65 Years and Older, Male Preventive care refers to lifestyle choices and visits with your health care provider that can promote health and wellness. What does preventive care include? A yearly physical exam. This is also called an annual well check. Dental exams once or twice a year. Routine eye exams. Ask your health care provider how often you should have your eyes checked. Personal lifestyle choices, including: Daily care of your teeth and gums. Regular physical activity. Eating a healthy diet. Avoiding tobacco and drug use. Limiting alcohol use. Practicing safe sex. Taking low doses of aspirin every day. Taking vitamin and mineral supplements as recommended by your health care provider. What happens during an annual well check? The services and screenings done by your health care provider during your annual well check will depend on your age, overall health, lifestyle risk factors, and family history of disease. Counseling  Your health care provider may ask you questions about your: Alcohol use. Tobacco use. Drug use. Emotional well-being. Home and relationship well-being. Sexual activity. Eating habits. History of falls. Memory and ability to understand (cognition). Work and work Astronomer. Screening  You may  have the following tests or measurements: Height, weight, and BMI. Blood pressure. Lipid and cholesterol levels. These may be checked every 5 years, or more frequently if you are over 85 years old. Skin check. Lung cancer screening. You may have this screening every year starting at age 23 if you have a 30-pack-year history of smoking and currently smoke or have quit within the past 15 years. Fecal occult blood test (FOBT) of the stool. You may have this test every year starting at age 18. Flexible sigmoidoscopy or colonoscopy. You may have a sigmoidoscopy every 5 years or a colonoscopy every 10 years starting at age 65. Prostate cancer screening. Recommendations will vary depending on your family history and other risks. Hepatitis C blood test. Hepatitis B blood test. Sexually transmitted disease (STD) testing. Diabetes screening. This is done by checking your blood sugar (glucose) after you have not eaten for a while (fasting). You may have this done every 1-3 years. Abdominal aortic aneurysm (AAA) screening. You may need this if you are a current or former smoker. Osteoporosis. You may be screened starting at age 82 if you are at high risk. Talk with your health care provider about your test results, treatment options, and if necessary, the need for more tests. Vaccines  Your health care provider may recommend certain vaccines, such as: Influenza vaccine. This is recommended every year. Tetanus, diphtheria, and acellular pertussis (Tdap, Td) vaccine. You may need a Td booster every 10 years. Zoster vaccine. You may need this after age 60. Pneumococcal 13-valent conjugate (PCV13) vaccine. One dose is recommended after age 27. Pneumococcal polysaccharide (PPSV23) vaccine. One dose is recommended after age 9. Talk to your health care provider about which screenings and vaccines you need and how often you need them. This information  is not intended to replace advice given to you by your  health care provider. Make sure you discuss any questions you have with your health care provider. Document Released: 03/20/2015 Document Revised: 11/11/2015 Document Reviewed: 12/23/2014 Elsevier Interactive Patient Education  2017 [place] Prevention in the Home Falls can cause injuries. They can happen to people of all ages. There are many things you can do to make your home safe and to help prevent falls. What can I do on the outside of my home? Regularly fix the edges of walkways and driveways and fix any cracks. Remove anything that might make you trip as you walk through a door, such as a raised step or threshold. Trim any bushes or trees on the path to your home. Use bright outdoor lighting. Clear any walking paths of anything that might make someone trip, such as rocks or tools. Regularly check to see if handrails are loose or broken. Make sure that both sides of any steps have handrails. Any raised decks and porches should have guardrails on the edges. Have any leaves, snow, or ice cleared regularly. Use sand or salt on walking paths during winter. Clean up any spills in your garage right away. This includes oil or grease spills. What can I do in the bathroom? Use night lights. Install grab bars by the toilet and in the tub and shower. Do not use towel bars as grab bars. Use non-skid mats or decals in the tub or shower. If you need to sit down in the shower, use a plastic, non-slip stool. Keep the floor dry. Clean up any water that spills on the floor as soon as it happens. Remove soap buildup in the tub or shower regularly. Attach bath mats securely with double-sided non-slip rug tape. Do not have throw rugs and other things on the floor that can make you trip. What can I do in the bedroom? Use night lights. Make sure that you have a light by your bed that is easy to reach. Do not use any sheets or blankets that are too big for your bed. They should not hang down  onto the floor. Have a firm chair that has side arms. You can use this for support while you get dressed. Do not have throw rugs and other things on the floor that can make you trip. What can I do in the kitchen? Clean up any spills right away. Avoid walking on wet floors. Keep items that you use a lot in easy-to-reach places. If you need to reach something above you, use a strong step stool that has a grab bar. Keep electrical cords out of the way. Do not use floor polish or wax that makes floors slippery. If you must use wax, use non-skid floor wax. Do not have throw rugs and other things on the floor that can make you trip. What can I do with my stairs? Do not leave any items on the stairs. Make sure that there are handrails on both sides of the stairs and use them. Fix handrails that are broken or loose. Make sure that handrails are as long as the stairways. Check any carpeting to make sure that it is firmly attached to the stairs. Fix any carpet that is loose or worn. Avoid having throw rugs at the top or bottom of the stairs. If you do have throw rugs, attach them to the floor with carpet tape. Make sure that you have a light switch at the top of  the stairs and the bottom of the stairs. If you do not have them, ask someone to add them for you. What else can I do to help prevent falls? Wear shoes that: Do not have high heels. Have rubber bottoms. Are comfortable and fit you well. Are closed at the toe. Do not wear sandals. If you use a stepladder: Make sure that it is fully opened. Do not climb a closed stepladder. Make sure that both sides of the stepladder are locked into place. Ask someone to hold it for you, if possible. Clearly mark and make sure that you can see: Any grab bars or handrails. First and last steps. Where the edge of each step is. Use tools that help you move around (mobility aids) if they are needed. These include: Canes. Walkers. Scooters. Crutches. Turn  on the lights when you go into a dark area. Replace any light bulbs as soon as they burn out. Set up your furniture so you have a clear path. Avoid moving your furniture around. If any of your floors are uneven, fix them. If there are any pets around you, be aware of where they are. Review your medicines with your doctor. Some medicines can make you feel dizzy. This can increase your chance of falling. Ask your doctor what other things that you can do to help prevent falls. This information is not intended to replace advice given to you by your health care provider. Make sure you discuss any questions you have with your health care provider. Document Released: 12/18/2008 Document Revised: 07/30/2015 Document Reviewed: 03/28/2014 Elsevier Interactive Patient Education  2017 [employer].

## 2022-09-06 NOTE — Progress Notes (Addendum)
Subjective:   Ekam Derstine is a 89 y.o. male who presents for Medicare Annual/Subsequent preventive examination.  Visit Complete: Virtual  I connected with  Ellery Plunk on 09/06/22 by a audio enabled telemedicine application and verified that I am speaking with the correct person using two identifiers.  Patient Location: Home  Provider Location: Home Office  I discussed the limitations of evaluation and management by telemedicine. The patient expressed understanding and agreed to proceed.    Review of Systems      Nutrition Risk Assessment:  Has the patient had any N/V/D within the last 2 months?  No  Does the patient have any non-healing wounds?  No  Has the patient had any unintentional weight loss or weight gain?  No   Diabetes:  Is the patient diabetic?  Yes  If diabetic, was a CBG obtained today?  No  Did the patient bring in their glucometer from home?  No  How often do you monitor your CBG's? 1x a day.   Financial Strains and Diabetes Management:  Are you having any financial strains with the device, your supplies or your medication? No .  Does the patient want to be seen by Chronic Care Management for management of their diabetes?  No  Would the patient like to be referred to a Nutritionist or for Diabetic Management?  No   Diabetic Exams:  Diabetic Eye Exam: Completed . Overdue for diabetic eye exam. Pt has been advised about the importance in completing this exam. A referral has been placed today. Message sent to referral coordinator for scheduling purposes. Advised pt to expect a call from office referred to regarding appt.  Diabetic Foot Exam:   patient is seeing wound care for a cut on toe (third)  right  Cardiac Risk Factors include: advanced age (>31men, >64 women);diabetes mellitus;male gender;hypertension;family history of premature cardiovascular disease;sedentary lifestyle     Objective:    Today's Vitals   There is no height or  weight on file to calculate BMI.     09/06/2022    1:14 PM 05/27/2022    3:53 PM 05/13/2022    2:35 PM 02/22/2022   11:51 AM 01/21/2022    9:08 AM 07/01/2021    2:30 PM 05/25/2021    4:23 PM  Advanced Directives  Does Patient Have a Medical Advance Directive? Yes No No No Yes No Yes  Type of Advance Directive     Living will  Healthcare Power of [place];Living will  Does patient want to make changes to medical advance directive?     No - Patient declined    Copy of Healthcare Power of Attorney in Chart?       Yes - validated most recent copy scanned in chart (See row information)  Would patient like information on creating a medical advance directive?   No - Patient declined        Current Medications (verified) Outpatient Encounter Medications as of 09/06/2022  Medication Sig   albuterol (VENTOLIN HFA) 108 (90 Base) MCG/ACT inhaler Inhale 2 puffs into the lungs every 6 (six) hours as needed for wheezing or shortness of breath.   allopurinol (ZYLOPRIM) 100 MG tablet Take 1 tablet (100 mg total) by mouth daily.   apixaban (ELIQUIS) 2.5 MG TABS tablet Take 1 tablet (2.5 mg total) by mouth 2 (two) times daily.   aspirin 81 MG EC tablet Take 81 mg by mouth daily.   atorvastatin (LIPITOR) 40 MG tablet TAKE 1 TABLET BY MOUTH EVERY  DAY   blood glucose meter kit and supplies KIT Dispense based on patient and insurance preference. Use up to four times daily as directed.   cycloSPORINE (RESTASIS) 0.05 % ophthalmic emulsion Place 1 drop into both eyes 2 (two) times daily. (Patient taking differently: Place 1 drop into both eyes 2 (two) times daily as needed (dry eyes).)   diclofenac Sodium (VOLTAREN) 1 % GEL TAKE ON AFFECTED AREAS AS NEEDED FOR PAIN UP TO 4 TIMES A DAY   gabapentin (NEURONTIN) 300 MG capsule Take 1 capsule (300 mg total) by mouth daily as needed. 300 mg in the morning, 600 mg at bedtime   insulin glargine (LANTUS) 100 UNIT/ML Solostar Pen Inject 5 Units into the skin daily.   Insulin  Pen Needle 31G X 5 MM MISC Please use to administer insulin. E11.65   JARDIANCE 25 MG TABS tablet TAKE 1 TABLET (25 MG TOTAL) BY MOUTH DAILY.   Lancets (ONETOUCH DELICA PLUS LANCET33G) MISC USE AS DIRECTED UP TO 4 TIMES DAILY AS DIRECTED   levocetirizine (XYZAL) 5 MG tablet TAKE 1 TABLET BY MOUTH TWICE A DAY   Multiple Vitamins-Minerals (MULTIVITAMIN WITH MINERALS) tablet Take 1 tablet by mouth daily.   ONETOUCH VERIO test strip TEST UP TO 4 TIMES DAILY AS DIRECTED   tiotropium (SPIRIVA) 18 MCG inhalation capsule INHALE 1 CAPSULE VIA HANDIHALER ONCE DAILY AT THE SAME TIME EVERY DAY   triamcinolone cream (KENALOG) 0.1 % APPLY TOPICALLY AS NEEDED (SKIN ITCHING).   WIXELA INHUB 250-50 MCG/ACT AEPB INHALE 1 PUFF INTO THE LUNGS 2 (TWO) TIMES DAILY. IN THE MORNING AND AT BEDTIME.   sodium bicarbonate 650 MG tablet Take 1 tablet (650 mg total) by mouth 2 (two) times daily. (Patient not taking: Reported on 06/21/2022)   [DISCONTINUED] atorvastatin (LIPITOR) 20 MG tablet TAKE 1 TABLET BY MOUTH EVERY DAY (Patient not taking: Reported on 07/07/2021)   No facility-administered encounter medications on file as of 09/06/2022.    Allergies (verified) Patient has no known allergies.   History: Past Medical History:  Diagnosis Date   ANEMIA, IRON DEF, NOS 01/15/2007   Qualifier: Diagnosis of  By: Constance Goltz MD, Matthew     Arthritis    Asthma    Cataract extraction status of left eye 2016   Cataract extraction status of right eye 2016   CHOLECYSTECTOMY, LAPAROSCOPIC, HX OF 04/12/2007   Qualifier: Diagnosis of  By: Burnadette Pop  MD, Trisha Mangle     COPD (chronic obstructive pulmonary disease) (HCC)    Critical lower limb ischemia (HCC) 10/02/2018   Diabetes mellitus without complication (HCC)    Gout 2007   Hypertension    Neuromuscular disorder (HCC)    Neuropathy   Neuropathy    bilateral feet   Pneumonia    Toe ulcer (HCC) 09/20/2018   Past Surgical History:  Procedure Laterality Date   ABDOMINAL  AORTOGRAM W/LOWER EXTREMITY N/A 10/03/2018   Procedure: ABDOMINAL AORTOGRAM W/LOWER EXTREMITY;  Surgeon: Cephus Shelling, MD;  Location: [facility];  Service: Cardiovascular;  Laterality: N/A;  Bilateral    ABDOMINAL AORTOGRAM W/LOWER EXTREMITY N/A 01/21/2022   Procedure: ABDOMINAL AORTOGRAM W/LOWER EXTREMITY;  Surgeon: Leonie Douglas, MD;  Location: [facility];  Service: Cardiovascular;  Laterality: N/A;   AMPUTATION TOE Right 02/03/2022   Procedure: RIGHT THIRD TOE AMPUTATION;  Surgeon: Leonie Douglas, MD;  Location: [facility] OR;  Service: Vascular;  Laterality: Right;   CHOLECYSTECTOMY     FEMORAL-TIBIAL BYPASS GRAFT Right 02/03/2022   Procedure:  RIGHT COMMON FEMORAL TO PEDAL ARTERY BYPASS WITH 6CM POLYTETRAFLUOROETHYLENE GRAFT;  Surgeon: Leonie Douglas, MD;  Location: [facility] OR;  Service: Vascular;  Laterality: Right;   toe ampuation Right    Family History  Problem Relation Age of Onset   Asthma Mother    Arthritis Sister    Alcohol abuse Sister    Cirrhosis Sister    Asthma Sister    Heart disease Brother    Stroke Brother    Diabetes Brother    Cancer Brother        colon   Stroke Brother    Heart disease Brother    Arthritis Daughter    Asthma Daughter    Diabetes Son    Social History   Socioeconomic History   Marital status: Widowed    Spouse name: Not on file   Number of children: Not on file   Years of education: 10   Highest education level: 10th grade  Occupational History   Occupation: RetiredHotel manager Work  Tobacco Use   Smoking status: Former    Packs/day: 0.50    Years: 60.00    Additional pack years: 0.00    Total pack years: 30.00    Types: Cigars, Cigarettes    Start date: [birth year]/08/1949    Quit date: 06/08/2011    Years since quitting: 11.2    Passive exposure: Past   Smokeless tobacco: Never  Vaping Use   Vaping Use: Never used  Substance and Sexual Activity   Alcohol use: Not Currently    Comment: Hx of use   Drug use: Yes     Types: Marijuana   Sexual activity: Not Currently  Other Topics Concern   Not on file  Social History Narrative   Patient lives with his daughter and her husband.   Patients wife died in 04-Nov-2018.   Health Care POA:    Emergency Contact: Daughter- Granville Lewis [phone]   End of Life Plan:       Any pets: none   Diet: Patient has a varied diet and reports eating bacon and eggs every morning. Does not like most fruit. Likes flavored water, kool-aid.   No current exercise.     Seatbelts: Pt reports wearing seatbelt when in vehicle.   Hobbies: likes to watch [employer] and rest                Social Determinants of Health   Financial Resource Strain: Low Risk  (09/06/2022)   Overall Financial Resource Strain (CARDIA)    Difficulty of Paying Living Expenses: Not hard at all  Food Insecurity: No Food Insecurity (09/06/2022)   Hunger Vital Sign    Worried About Running Out of Food in the Last Year: Never true    Ran Out of Food in the Last Year: Never true  Transportation Needs: No Transportation Needs (09/06/2022)   PRAPARE - Administrator, Civil Service (Medical): No    Lack of Transportation (Non-Medical): No  Physical Activity: Inactive (09/06/2022)   Exercise Vital Sign    Days of Exercise per Week: 0 days    Minutes of Exercise per Session: 0 min  Stress: No Stress Concern Present (09/06/2022)   [employer] of Occupational Health - Occupational Stress Questionnaire    Feeling of Stress : Not at all  Social Connections: Socially Isolated (09/06/2022)   Social Connection and Isolation Panel [NHANES]    Frequency of Communication with Friends and Family: More than three times a week  Frequency of Social Gatherings with Friends and Family: Twice a week    Attends Religious Services: Never    Database administrator or Organizations: No    Attends Banker Meetings: Never    Marital Status: Widowed    Tobacco Counseling Counseling given: Not Answered   Clinical  Intake:  Pre-visit preparation completed: Yes  Pain : No/denies pain     Diabetes: Yes CBG done?: No Did pt. bring in CBG monitor from home?: No  How often do you need to have someone help you when you read instructions, pamphlets, or other written materials from your doctor or pharmacy?: 1 - Never  Interpreter Needed?: No  Information entered by :: Remi Haggard LPN   Activities of Daily Living    09/06/2022    1:15 PM 02/03/2022    8:10 AM  In your present state of health, do you have any difficulty performing the following activities:  Hearing? 1 1  Comment  Bilateral Hearing Aids  Vision? 0 0  Difficulty concentrating or making decisions? 0 1  Walking or climbing stairs? 1 1  Dressing or bathing? 0 0  Doing errands, shopping? 1   Preparing Food and eating ? N   Using the Toilet? N   In the past six months, have you accidently leaked urine? N   Do you have problems with loss of bowel control? N   Managing your Medications? N   Managing your Finances? N   Housekeeping or managing your Housekeeping? N     Patient Care Team: [name], MD as PCP - General (Family Medicine) Johney Maine, MD as Consulting Physician (Hematology) Hanley Seamen Dustin Folks, MD as Referring Physician (Optometry)  Indicate any recent Medical Services you may have received from other than [facility] providers in the past year (date may be approximate).     Assessment:   This is a routine wellness examination for Aime.  Hearing/Vision screen Hearing Screening - Comments:: Bilateral hearing aids Vision Screening - Comments:: Up to date groat  Dietary issues and exercise activities discussed:     Goals Addressed             This Visit's Progress    Patient Stated       Would like to get some hospital bills paid       Depression Screen    09/06/2022    1:19 PM 06/10/2022    3:28 PM 05/27/2022    3:52 PM 05/13/2022    2:35 PM 07/07/2021    3:14 PM 05/25/2021    4:13 PM  05/25/2021    4:12 PM  PHQ 2/9 Scores  PHQ - 2 Score 3 0 1 1 0 0 0  PHQ- 9 Score 4 1 3 10 3 2      Fall Risk    09/06/2022    1:12 PM 06/10/2022    3:26 PM 05/13/2022    2:35 PM 07/07/2021    3:14 PM 05/25/2021    4:14 PM  Fall Risk   Falls in the past year? 0 0 0 0 1  Number falls in past yr: 0 0 0 0 0  Injury with Fall? 0 0 0 0 1  Risk for fall due to :     Impaired balance/gait;Impaired mobility  Follow up Falls evaluation completed;Education provided;Falls prevention discussed        MEDICARE RISK AT HOME:  Medicare Risk at Home - 09/06/22 1312     Any stairs in or  around the home? No    If so, are there any without handrails? No    Home free of loose throw rugs in walkways, pet beds, electrical cords, etc? Yes    Adequate lighting in your home to reduce risk of falls? Yes    Life alert? No    Use of a cane, walker or w/c? No    Grab bars in the bathroom? No    Shower chair or bench in shower? No    Elevated toilet seat or a handicapped toilet? No             TIMED UP AND GO:  Was the test performed?  No    Cognitive Function:    01/01/2018    3:32 PM 12/24/2012    3:00 PM 10/11/2011    3:00 PM 09/13/2010    4:00 PM  MMSE - Mini Mental State Exam  Orientation to time 5 5 5 5   Orientation to Place 5 5 5 5   Registration 3 3 3 3   Attention/ Calculation 5 5 3 5   Recall 3 3 3 3   Language- name 2 objects 2 2 2 2   Language- repeat 1 1 1 1   Language- follow 3 step command 3 3 3 3   Language- read & follow direction 1 1 1 1   Write a sentence 1 0 1 1  Copy design 1 1 1 1   Total score 30 29 28 30         09/06/2022    1:17 PM 05/25/2021    4:18 PM 01/01/2018    3:33 PM  6CIT Screen  What Year? 0 points 0 points 0 points  What month? 0 points 0 points 0 points  What time? 0 points 0 points 0 points  Count back from 20  2 points 0 points  Months in reverse 2 points 2 points 0 points  Repeat phrase 2 points 2 points 0 points  Total Score  6 points 0 points     Immunizations Immunization History  Administered Date(s) Administered   COVID-19, mRNA, vaccine(Comirnaty)12 years and older 03/22/2022   Fluad Quad(high Dose 65+) 03/11/2019, 12/20/2019, 12/09/2020, 11/29/2021   Influenza Split 12/29/2010, 12/06/2011   Influenza Whole 01/16/2009   Influenza,inj,Quad PF,6+ Mos 12/24/2012, 12/16/2013, 12/12/2014, 12/07/2015, 12/29/2016, 11/10/2017   Influenza-Unspecified 12/28/2000, 01/06/2003, 01/05/2004, 01/12/2005, 01/05/2006   PFIZER Comirnaty(Gray Top)Covid-19 Tri-Sucrose Vaccine 06/17/2020   PFIZER(Purple Top)SARS-COV-2 Vaccination 03/30/2019, 04/20/2019, 12/20/2019   Pfizer Covid-19 Vaccine Bivalent Booster 56yrs & up 12/09/2020   Pneumococcal Conjugate-13 10/24/2007   Pneumococcal Polysaccharide-23 09/29/2000, 07/27/2006, 07/11/2011   Tdap 07/27/2006, 07/23/2008, 01/12/2022    TDAP status: Up to date  Flu Vaccine status: Up to date  Pneumococcal vaccine status: Up to date  Covid-19 vaccine status: Information provided on how to obtain vaccines.   Qualifies for Shingles Vaccine? Yes   Zostavax completed No   Shingrix Completed?: No.    Education has been provided regarding the importance of this vaccine. Patient has been advised to call insurance company to determine out of pocket expense if they have not yet received this vaccine. Advised may also receive vaccine at local pharmacy or Health Dept. Verbalized acceptance and understanding.  Screening Tests Health Maintenance  Topic Date Due   Colonoscopy  10/04/2018   FOOT EXAM  08/14/2022   COVID-19 Vaccine (7 - 2023-24 season) 09/22/2022 (Originally 05/17/2022)   Zoster Vaccines- Shingrix (1 of 2) 12/07/2022 (Originally 07/26/1952)   INFLUENZA VACCINE  10/06/2022   HEMOGLOBIN A1C  01/21/2023  OPHTHALMOLOGY EXAM  03/25/2023   Medicare Annual Wellness (AWV)  09/06/2023   DTaP/Tdap/Td (4 - Td or Tdap) 01/13/2032   Pneumonia Vaccine 34+ Years old  Completed   HPV VACCINES  Aged Out     Health Maintenance  Health Maintenance Due  Topic Date Due   Colonoscopy  10/04/2018   FOOT EXAM  08/14/2022    Colorectal cancer screening: No longer required.   Lung Cancer Screening: (Low Dose CT Chest recommended if Age 23-80 years, 20 pack-year currently smoking OR have quit w/in 15years.) does not qualify.   Lung Cancer Screening Referral:   Additional Screening:  Hepatitis C Screening: does not qualify;   Vision Screening: Recommended annual ophthalmology exams for early detection of glaucoma and other disorders of the eye. Is the patient up to date with their annual eye exam?  Yes  Who is the provider or what is the name of the office in which the patient attends annual eye exams? [facility] If pt is not established with a provider, would they like to be referred to a provider to establish care? No .   Dental Screening: Recommended annual dental exams for proper oral hygiene    Community Resource Referral / Chronic Care Management: CRR required this visit?  No   CCM required this visit?  No     Plan:     I have personally reviewed and noted the following in the patient's chart:   Medical and social history Use of alcohol, tobacco or illicit drugs  Current medications and supplements including opioid prescriptions. Patient is not currently taking opioid prescriptions. Functional ability and status Nutritional status Physical activity Advanced directives List of other physicians Hospitalizations, surgeries, and ER visits in previous 12 months Vitals Screenings to include cognitive, depression, and falls Referrals and appointments  In addition, I have reviewed and discussed with patient certain preventive protocols, quality metrics, and best practice recommendations. A written personalized care plan for preventive services as well as general preventive health recommendations were provided to patient.     Remi Haggard, LPN   04/07/3084   After Visit  Summary: (Declined) Due to this being a telephonic visit, with patients personalized plan was offered to patient but patient Declined AVS at this time   Nurse Notes:     I have reviewed this visit and agree with the documentation.

## 2022-09-09 ENCOUNTER — Ambulatory Visit (INDEPENDENT_AMBULATORY_CARE_PROVIDER_SITE_OTHER): Payer: PPO | Admitting: Family Medicine

## 2022-09-09 ENCOUNTER — Other Ambulatory Visit: Payer: Self-pay

## 2022-09-09 ENCOUNTER — Encounter: Payer: Self-pay | Admitting: Family Medicine

## 2022-09-09 ENCOUNTER — Ambulatory Visit (HOSPITAL_COMMUNITY)
Admission: RE | Admit: 2022-09-09 | Discharge: 2022-09-09 | Disposition: A | Discharge status: Home / Self Care | Payer: PPO | Source: Ambulatory Visit | Attending: Family Medicine | Admitting: Family Medicine

## 2022-09-09 ENCOUNTER — Ambulatory Visit
Admission: RE | Admit: 2022-09-09 | Discharge: 2022-09-09 | Disposition: A | Discharge status: Home / Self Care | Payer: PPO | Source: Ambulatory Visit | Attending: Family Medicine | Admitting: Family Medicine

## 2022-09-09 VITALS — BP 122/63 | HR 104 | Temp 98.4°F | Ht 71.0 in | Wt 172.0 lb

## 2022-09-09 DIAGNOSIS — R000 Tachycardia, unspecified: Secondary | ICD-10-CM

## 2022-09-09 NOTE — Progress Notes (Unsigned)
    SUBJECTIVE:   CHIEF COMPLAINT / HPI: fever, elevated HR  Hx of chronic right foot wounds. Last seen 09/02/22, improving.  Home health nurse told him to come in because of fever. Last couple of days not feeling good. Feeling wobbly. Started Wednesday, went out to [employer] to shop with daughter. Had chills. Coughing yellow phlegm. No difficulty breathing. Not having to use more inhalers. No N/V. No chest pain.  On Eliquis he says since toe amputations in November, denies history of A-fib.  PERTINENT  PMH / PSH: HTN, PAD, COPD, T2DM, CKD  OBJECTIVE:   BP 122/63   Pulse (!) 104   Temp 98.4 F (36.9 C)   Ht 5\' 11"  (1.803 m)   Wt 172 lb (78 kg)   SpO2 95%   BMI 23.99 kg/m   General: NAD sitting comfortably in clinic chair HEENT: Dry mucous membranes Neuro: A&O Cardiovascular: Tachycardic, regular rhythm, no murmurs, no peripheral edema Respiratory: normal WOB on RA, CTAB, no wheezes, ronchi or rales Abdomen: soft, NTTP, no rebound or guarding Extremities: Moving all 4 extremities equally, right chronic foot wound examined, no purulent drainage no surrounding erythema  EKG: Personally reviewed - sinus tachycardia, possible right axis deviation  ASSESSMENT/PLAN:   Tachycardia History most consistent with viral upper respiratory infection versus community-acquired pneumonia, in the setting of dehydration and extreme heat this week. However patient has multiple comorbidities that could suggest other causes of tachycardia and fever.  Will rule out euglycemic DKA (on Jardiance), new atrial fibrillation or other arrhythmia.  Heart rate reassuringly improved with rest and blood pressure stable, however patient does appear mildly dehydrated on exam.  Chronic wound does not appear infected.  EKG demonstrating sinus tachycardia however no concern for other arrhythmia.  Will evaluate with BMP for possible euglycemic DKA.  Will rule out pneumonia with chest x-ray (patient has history of multiple  pneumonias).  Antibiotics as necessary.  Patient counseled on remaining hydrated over the next several days until his follow-up next week on Wednesday.  Instructed to stop Jardiance until that appointment as well. -     EKG 12-Lead -     DG Chest 2 View; Future -     Basic metabolic panel -     CBC   Return in about 3 days (around 09/12/2022).  Celine Mans, MD [facility]

## 2022-09-09 NOTE — Patient Instructions (Addendum)
It was great to see you! Thank you for allowing me to participate in your care!   Our plans for today:  -Please stop taking Jardiance until you are seen in clinic next week. -Please make sure to drink lots of water and stay hydrated. -I will call you know the results of your chest x-ray and labs.  Send antibiotics if they are needed. -If you feels short of breath, chest pain, failure to pass out please seek medical attention at urgent care or ER.   Please arrive 15 minutes PRIOR to your next scheduled appointment time! If you do not, this affects OTHER patients' care.  Take care and seek immediate care sooner if you develop any concerns.   Dr. Celine Mans, MD [facility]

## 2022-09-10 LAB — CBC
Hematocrit: 43.2 % (ref 37.5–51.0)
Hemoglobin: 14.1 g/dL (ref 13.0–17.7)
MCH: 24.9 pg — ABNORMAL LOW (ref 26.6–33.0)
MCHC: 32.6 g/dL (ref 31.5–35.7)
MCV: 76 fL — ABNORMAL LOW (ref 79–97)
Platelets: 290 10*3/uL (ref 150–450)
RBC: 5.67 x10E6/uL (ref 4.14–5.80)
RDW: 16 % — ABNORMAL HIGH (ref 11.6–15.4)
WBC: 16.4 10*3/uL — ABNORMAL HIGH (ref 3.4–10.8)

## 2022-09-10 LAB — BASIC METABOLIC PANEL
BUN/Creatinine Ratio: 17 (ref 10–24)
BUN: 34 mg/dL — ABNORMAL HIGH (ref 8–27)
CO2: 20 mmol/L (ref 20–29)
Calcium: 9.3 mg/dL (ref 8.6–10.2)
Chloride: 103 mmol/L (ref 96–106)
Creatinine, Ser: 1.98 mg/dL — ABNORMAL HIGH (ref 0.76–1.27)
Glucose: 126 mg/dL — ABNORMAL HIGH (ref 70–99)
Potassium: 4.8 mmol/L (ref 3.5–5.2)
Sodium: 140 mmol/L (ref 134–144)
eGFR: 32 mL/min/{1.73_m2} — ABNORMAL LOW (ref >59)

## 2022-09-12 ENCOUNTER — Telehealth: Payer: Self-pay | Admitting: Family Medicine

## 2022-09-12 NOTE — Telephone Encounter (Signed)
Attempted to call. Patient left HIPAA compliant voicemail. Attempt x1.

## 2022-09-14 ENCOUNTER — Telehealth: Payer: Self-pay | Admitting: Family Medicine

## 2022-09-14 ENCOUNTER — Encounter: Payer: Self-pay | Admitting: Family Medicine

## 2022-09-14 ENCOUNTER — Ambulatory Visit (INDEPENDENT_AMBULATORY_CARE_PROVIDER_SITE_OTHER): Payer: PPO | Admitting: Family Medicine

## 2022-09-14 VITALS — BP 149/87 | HR 93 | Ht 71.0 in | Wt 171.8 lb

## 2022-09-14 DIAGNOSIS — J449 Chronic obstructive pulmonary disease, unspecified: Secondary | ICD-10-CM | POA: Diagnosis not present

## 2022-09-14 DIAGNOSIS — J069 Acute upper respiratory infection, unspecified: Secondary | ICD-10-CM | POA: Insufficient documentation

## 2022-09-14 MED ORDER — FLUTICASONE-SALMETEROL 250-50 MCG/ACT IN AEPB: INHALATION_SPRAY | RESPIRATORY_TRACT | 2 refills | Status: DC

## 2022-09-14 NOTE — Telephone Encounter (Signed)
Received call from radiology regarding abnormal chest x-ray.  Report in epic.  Patient seen by Dr. Hyacinth Meeker earlier today.  Chest x-ray report: IMPRESSION: 1. Focal nodular opacity in the left lower lobe, concerning for aspiration or infection. Recommend repeat chest radiograph in 6-8 weeks to ensure resolution. 2. Bandlike opacities in the right lower lobe, likely atelectasis, however aspiration/infection is also not excluded.  I spoke with Dr. Hyacinth Meeker and Dr. Miquel Dunn and they are going to call the patient and come up with treatment options further evaluation options.

## 2022-09-14 NOTE — Patient Instructions (Addendum)
It was wonderful to see you today.  Today we talked about:  Your cough and tachycardia.  Your heart rate has returned to normal given that all of your labs and chest x-ray looked good we suspect that you have had a viral upper respiratory infection.  Please continue to drink plenty of water if you need cough relief it is okay for you to take over-the-counter Coricidin HBP.  If your cough gets worse, have symptoms of fever or chills, or have chest pain weakness or dizziness please call our office or proceed directly to the emergency department.  We also refilled your Wixela inhaler for you. That should be available at your normal pharmacy sometime later today. Can follow-up with your primary care physician as needed.   Thank you for choosing [facility].   Please call [phone] with any questions about today's appointment.  Please arrive at least 15 minutes prior to your scheduled appointments.   If you had blood work today, I will send you a MyChart message or a letter if results are normal. Otherwise, I will give you a call.   If you had a referral placed, they will call you to set up an appointment. Please give [place] a call if you don't hear back in the next 2 weeks.   If you need additional refills before your next appointment, please call your pharmacy first.   Gerrit Heck, DO Family Medicine

## 2022-09-14 NOTE — Progress Notes (Signed)
    SUBJECTIVE:   CHIEF COMPLAINT / HPI:   Tachycardia f/u. WBCs elevated but consistently so, lower than normal.  Patient's only ongoing complaint is continued cough.  He denies any shortness of breath or chest pain.  He has not been taking his temperature but has not felt feverish.  His labs as drawn on Friday 7 5 are within normal limits and preliminary review of chest x-ray does not raise suspicions for pneumonia.  PERTINENT  PMH / PSH: COPD  OBJECTIVE:   BP (!) 149/87   Pulse 93   Ht 5\' 11"  (1.803 m)   Wt 171 lb 12.8 oz (77.9 kg)   SpO2 96%   BMI 23.96 kg/m   General: A&O, NAD HEENT: No sign of trauma, EOM grossly intact.  Mild erythema of pharyngeal mucosa. Cardiac: RRR, no m/r/g Respiratory: CTAB, normal WOB, no w/c/r   ASSESSMENT/PLAN:   Viral URI with cough Advised patient to stay hydrated, and take appropriate over-the-counter symptom relief medications as needed.  Discussed red flag symptoms which should prompt emergency room evaluation as well as return to office.     Gerrit Heck, DO [facility]

## 2022-09-14 NOTE — Telephone Encounter (Signed)
Called patient x2, rang straight to voicemail. Left HIPAA compliant VM discsusing need to discuss chest X-ray results with him and to call back as soon as possible.  Burley Saver MD Gerrit Heck DO

## 2022-09-14 NOTE — Assessment & Plan Note (Signed)
Advised patient to stay hydrated, and take appropriate over-the-counter symptom relief medications as needed.  Discussed red flag symptoms which should prompt emergency room evaluation as well as return to office.

## 2022-09-14 NOTE — Telephone Encounter (Signed)
Attempt x2 to call, reached voicemail. Patient has appointment today at 11am. Messaged provider seeing to discuss labs.

## 2022-09-15 ENCOUNTER — Encounter: Payer: Self-pay | Admitting: Family Medicine

## 2022-09-15 ENCOUNTER — Encounter (HOSPITAL_BASED_OUTPATIENT_CLINIC_OR_DEPARTMENT_OTHER): Payer: PPO | Attending: Internal Medicine | Admitting: Internal Medicine

## 2022-09-15 DIAGNOSIS — N183 Chronic kidney disease, stage 3 unspecified: Secondary | ICD-10-CM | POA: Insufficient documentation

## 2022-09-15 DIAGNOSIS — I70235 Atherosclerosis of native arteries of right leg with ulceration of other part of foot: Secondary | ICD-10-CM | POA: Insufficient documentation

## 2022-09-15 DIAGNOSIS — L97512 Non-pressure chronic ulcer of other part of right foot with fat layer exposed: Secondary | ICD-10-CM | POA: Diagnosis not present

## 2022-09-15 DIAGNOSIS — Z89421 Acquired absence of other right toe(s): Secondary | ICD-10-CM | POA: Insufficient documentation

## 2022-09-15 DIAGNOSIS — E1122 Type 2 diabetes mellitus with diabetic chronic kidney disease: Secondary | ICD-10-CM | POA: Insufficient documentation

## 2022-09-15 DIAGNOSIS — E1151 Type 2 diabetes mellitus with diabetic peripheral angiopathy without gangrene: Secondary | ICD-10-CM | POA: Diagnosis not present

## 2022-09-15 DIAGNOSIS — E11621 Type 2 diabetes mellitus with foot ulcer: Secondary | ICD-10-CM | POA: Diagnosis present

## 2022-09-15 NOTE — Telephone Encounter (Signed)
Attempted to contact patient again regarding his chest xray and was sent to voicemail. Attempted to contact first alternative Contact, Salena Saner (patient's daughter), and left HIPAA compliant voicemail. 2nd attempt.

## 2022-09-15 NOTE — Telephone Encounter (Signed)
Attempted to reach patient again regarding chest xray results. Left HIPAA compliant voice message. 3rd attempt.

## 2022-09-19 ENCOUNTER — Other Ambulatory Visit: Payer: Self-pay | Admitting: *Deleted

## 2022-09-19 ENCOUNTER — Encounter: Payer: Self-pay | Admitting: Family Medicine

## 2022-09-19 DIAGNOSIS — M170 Bilateral primary osteoarthritis of knee: Secondary | ICD-10-CM

## 2022-09-20 MED ORDER — DICLOFENAC SODIUM 1 % EX GEL: CUTANEOUS | 3 refills | Status: DC

## 2022-09-21 NOTE — Progress Notes (Signed)
[name], [name] ([id number]) [id number] Page 1 of 9 Visit Report for 09/15/2022 Chief Complaint Document Details Patient Name: Date of Service: [name] [name] 09/15/2022 2:00 PM Medical Record Number: [id number] Patient Account Number: [id number] Date of Birth/Sex: Treating RN: [birth year] (89 y.o. M) Primary Care Provider: [name] Other Clinician: Referring Provider: Treating Provider/Extender: Caffie Pinto in Treatment: 10 Information Obtained from: Patient Chief Complaint 07/04/2022; right foot wound Electronic Signature(s) Signed: 09/16/2022 9:53:20 AM By: [name] DO Entered By: [name] on 09/15/2022 14:44:24 -------------------------------------------------------------------------------- Debridement Details Patient Name: Date of Service: [name] [name] 09/15/2022 2:00 PM Medical Record Number: [id number] Patient Account Number: [id number] Date of Birth/Sex: Treating RN: 1933/08/19 (89 y.o. [name] Primary Care Provider: [name] Other Clinician: Referring Provider: Treating Provider/Extender: Caffie Pinto in Treatment: 10 Debridement Performed for Assessment: Wound #3 Right Amputation Site - Toe Performed By: Physician [name], DO Debridement Type: Debridement Severity of Tissue Pre Debridement: Fat layer exposed Level of Consciousness (Pre-procedure): Awake and Alert Pre-procedure Verification/Time Out Yes - 14:36 Taken: Start Time: 14:38 Pain Control: Lidocaine 4% T opical Solution Percent of Wound Bed Debrided: 100% T Area Debrided (cm): otal 0.09 Tissue and other material debrided: Viable, Non-Viable, Slough, Slough Level: Non-Viable Tissue Debridement Description: Selective/Open Wound Instrument: Curette Bleeding: Minimum Hemostasis Achieved: Pressure End Time: 14:40 Procedural Pain: 0 Post Procedural Pain:  0 Response to Treatment: Procedure was tolerated well Level of Consciousness (Post- Awake and Alert procedure): Post Debridement Measurements of Total Wound Length: (cm) 1.1 Width: (cm) 0.1 Depth: (cm) 0.2 Volume: (cm) 0.017 Character of Wound/Ulcer Post Debridement: Improved [name], [name] ([id number]) [id number] Page 2 of 9 Severity of Tissue Post Debridement: Fat layer exposed Post Procedure Diagnosis Same as Pre-procedure Notes Scribed for Dr Mikey Bussing by Brenton Grills RN Electronic Signature(s) Signed: 09/16/2022 9:53:20 AM By: [name] DO Signed: 09/21/2022 7:59:22 AM By: Brenton Grills Entered By: Brenton Grills on 09/15/2022 14:39:11 -------------------------------------------------------------------------------- HPI Details Patient Name: Date of Service: [name] [name] 09/15/2022 2:00 PM Medical Record Number: [id number] Patient Account Number: [id number] Date of Birth/Sex: Treating RN: [birth year] (89 y.o. M) Primary Care Provider: [name] Other Clinician: Referring Provider: Treating Provider/Extender: Caffie Pinto in Treatment: 10 History of Present Illness HPI Description: 07/04/2022 [name] is an 89 year old male with a past medical history of uncontrolled, insulin dependent type 2 diabetes, CKD stage III, and COPD that presents to the clinic for a right foot wound. He has an extensive history of peripheral arterial disease to the right leg. He has had a right superficial femoral to posterior tibial artery bypass by Dr. Elonda Husky in 2020. He had a right common femoral to plantar posterior tibial artery bypass on 02/03/2022 by Dr. Lenell [name] and at that time he also had a right toe amputation. He states this wound site had healed completely. Prior to this he had amputations to the first and second digit to the right foot. He presents today with an open wound to the previous right toe  amputation site for the past 2 months. He has been following with vein and vascular for this issue. Most recent ABIs were on 06/22/2022 that showed absent waveforms in the right lower extremity. Currently his right lower extremity bypass is occluded and no plan for another anteriorgram and has he likely has no revascularization options. Currently he has been using Betadine to the wound bed. He denies signs of infection. 07/11/2022 patient arrives in  clinic today using Hydrofera Blue on the toe amputation site. The patient is revascularized and is not felt to have any other options for revascularization. We are using Hydrofera Blue and gauze. 5/13; patient presents for follow-up. He has been using Hydrofera Blue to the toe amputation site. He has had no issues. Wound is smaller. He has been approved for [facility] and was agreeable to have this placed in office today. 5/20; patient presents for follow-up. We have been using Kerecis to the wound bed. There is been improvement in healing. Unfortunately patient did not change the outer dressing for a week. There is slight maceration to the periwound. Unclear why he did not do this. 5/30; patient presents for follow-up. We have been using Kerecis to the wound bed. More maceration to the periwound today. No signs of infection. 6/10; patient presents for follow-up. He has been using Hydrofera Blue to the wound bed. He has no issues or complaints today. Maceration has almost completely resolved. 6/18; patient presents for follow-up. He has been using blast X and collagen to the wound bed without issues. Wound appears smaller today. 6/28; patient presents for follow-up. He has been using blast X and collagen to the wound bed. The wound is smaller. 7/11; patient presents for follow-up. He has been using blast X and collagen to the wound bed. Wound is smaller. He has slough accumulation. Electronic Signature(s) Signed: 09/16/2022 9:53:20 AM By: [name]  DO Entered By: [name] on 09/15/2022 14:44:43 -------------------------------------------------------------------------------- Physical Exam Details Patient Name: Date of Service: [name] [name] 09/15/2022 2:00 PM Medical Record Number: [id number] Patient Account Number: [id number] [name], [name] ([id number]) [id number] Page 3 of 9 Date of Birth/Sex: Treating RN: [birth year] (89 y.o. M) Primary Care Provider: [name] Other Clinician: Referring Provider: Treating Provider/Extender: Caffie Pinto in Treatment: 10 Constitutional respirations regular, non-labored and within target range for patient.. Cardiovascular 2+ dorsalis pedis/posterior tibialis pulses. Psychiatric pleasant and cooperative. Notes Third toe amputation site with granulation tissue and slough accumulation. No signs of surrounding infection. Electronic Signature(s) Signed: 09/16/2022 9:53:20 AM By: [name] DO Entered By: [name] on 09/15/2022 14:45:12 -------------------------------------------------------------------------------- Physician Orders Details Patient Name: Date of Service: [name] [name] 09/15/2022 2:00 PM Medical Record Number: [id number] Patient Account Number: [id number] Date of Birth/Sex: Treating RN: [birth year] (89 y.o. [name] Primary Care Provider: [name] Other Clinician: Referring Provider: Treating Provider/Extender: Caffie Pinto in Treatment: 10 Verbal / Phone Orders: No Diagnosis Coding Follow-up Appointments ppointment in 2 weeks. - Dr. Mikey Bussing Return A Other: - [employer] blastx and collagen- jennifer is the rep. ****Call [facility] to return the wound care package.***** Anesthetic (In clinic) Topical Lidocaine 5% applied to wound bed Bathing/ Shower/ Hygiene May shower with protection but do not get wound dressing(s) wet. Protect  dressing(s) with water repellant cover (for example, large plastic bag) or a cast cover and may then take shower. Edema Control - Lymphedema / SCD / Other Elevate legs to the level of the heart or above for 30 minutes daily and/or when sitting for 3-4 times a day throughout the day. Avoid standing for long periods of time. Wound Treatment Wound #3 - Amputation Site - Toe Wound Laterality: Right Cleanser: Vashe 5.8 (oz) 1 x Per Day/30 Days Discharge Instructions: Cleanse the wound with Vashe prior to applying a clean dressing using gauze sponges, not tissue or cotton balls. Topical: Blastex (Generic) 1 x Per Day/30 Days Prim Dressing: Promogran Prisma Matrix,  4.34 (sq in) (silver collagen) (Generic) 1 x Per Day/30 Days ary Discharge Instructions: Moisten collagen with saline or hydrogel Secondary Dressing: Woven Gauze Sponge, Non-Sterile 4x4 in (Generic) 1 x Per Day/30 Days Discharge Instructions: Apply over primary dressing as directed. Secured With: Paper Tape, 2x10 (in/yd) 1 x Per Day/30 Days Discharge Instructions: Secure dressing with tape as directed. [name], [name] ([id number]) [id number] Page 4 of 9 Electronic Signature(s) Signed: 09/16/2022 9:53:20 AM By: [name] DO Entered By: [name] on 09/15/2022 14:45:20 -------------------------------------------------------------------------------- Problem List Details Patient Name: Date of Service: [name] [name] 09/15/2022 2:00 PM Medical Record Number: [id number] Patient Account Number: [id number] Date of Birth/Sex: Treating RN: Feb 09, 1934 (89 y.o. [name] Primary Care Provider: [name] Other Clinician: Referring Provider: Treating Provider/Extender: Caffie Pinto in Treatment: 10 Active Problems ICD-10 Encounter Code Description Active Date MDM Diagnosis [phone] Non-pressure chronic ulcer of other part of right foot with fat layer  exposed 07/04/2022 No Yes E11.621 Type 2 diabetes mellitus with foot ulcer 07/04/2022 No Yes I70.235 Atherosclerosis of native arteries of right leg with ulceration of other part of 07/04/2022 No Yes foot I73.9 Peripheral vascular disease, unspecified 07/04/2022 No Yes Inactive Problems Resolved Problems Electronic Signature(s) Signed: 09/16/2022 9:53:20 AM By: [name] DO Entered By: [name] on 09/15/2022 14:44:07 -------------------------------------------------------------------------------- Progress Note Details Patient Name: Date of Service: [name] [name] 09/15/2022 2:00 PM Medical Record Number: [id number] Patient Account Number: [id number] Date of Birth/Sex: Treating RN: 1933/07/01 (89 y.o. M) Primary Care Provider: [name] Other Clinician: Referring Provider: Treating Provider/Extender: Caffie Pinto in Treatment: 10 Subjective Chief Complaint Information obtained from Patient 07/04/2022; right foot wound [name], [name] ([id number]) [id number] Page 5 of 9 History of Present Illness (HPI) 07/04/2022 [name] is an 89 year old male with a past medical history of uncontrolled, insulin dependent type 2 diabetes, CKD stage III, and COPD that presents to the clinic for a right foot wound. He has an extensive history of peripheral arterial disease to the right leg. He has had a right superficial femoral to posterior tibial artery bypass by Dr. Elonda Husky in 2020. He had a right common femoral to plantar posterior tibial artery bypass on 02/03/2022 by Dr. Lenell [name] and at that time he also had a right toe amputation. He states this wound site had healed completely. Prior to this he had amputations to the first and second digit to the right foot. He presents today with an open wound to the previous right toe amputation site for the past 2 months. He has been following with vein and vascular for  this issue. Most recent ABIs were on 06/22/2022 that showed absent waveforms in the right lower extremity. Currently his right lower extremity bypass is occluded and no plan for another anteriorgram and has he likely has no revascularization options. Currently he has been using Betadine to the wound bed. He denies signs of infection. 07/11/2022 patient arrives in clinic today using Hydrofera Blue on the toe amputation site. The patient is revascularized and is not felt to have any other options for revascularization. We are using Hydrofera Blue and gauze. 5/13; patient presents for follow-up. He has been using Hydrofera Blue to the toe amputation site. He has had no issues. Wound is smaller. He has been approved for [facility] and was agreeable to have this placed in office today. 5/20; patient presents for follow-up. We have been using Kerecis to the wound bed. There is been improvement in healing.  Unfortunately patient did not change the outer dressing for a week. There is slight maceration to the periwound. Unclear why he did not do this. 5/30; patient presents for follow-up. We have been using Kerecis to the wound bed. More maceration to the periwound today. No signs of infection. 6/10; patient presents for follow-up. He has been using Hydrofera Blue to the wound bed. He has no issues or complaints today. Maceration has almost completely resolved. 6/18; patient presents for follow-up. He has been using blast X and collagen to the wound bed without issues. Wound appears smaller today. 6/28; patient presents for follow-up. He has been using blast X and collagen to the wound bed. The wound is smaller. 7/11; patient presents for follow-up. He has been using blast X and collagen to the wound bed. Wound is smaller. He has slough accumulation. Patient History Information obtained from Patient. Family History Cancer - Siblings, Diabetes - Siblings,Child, Heart Disease - Siblings, Hypertension -  Father,Siblings, Lung Disease - Siblings, Stroke - Siblings, No family history of Hereditary Spherocytosis, Kidney Disease, Seizures, Thyroid Problems, Tuberculosis. Social History Former smoker - quit 10 yrs ago, Marital Status - Married, Alcohol Use - Never, Drug Use - No History, Caffeine Use - Daily - coffee. Medical History Eyes Patient has history of Cataracts - removed both eyes Denies history of Glaucoma, Optic Neuritis Ear/Nose/Mouth/Throat Denies history of Chronic sinus problems/congestion, Middle ear problems Hematologic/Lymphatic Patient has history of Anemia Denies history of Hemophilia, Human Immunodeficiency Virus, Lymphedema, Sickle Cell Disease Respiratory Patient has history of Asthma, Chronic Obstructive Pulmonary Disease (COPD) Denies history of Aspiration, Pneumothorax, Sleep Apnea, Tuberculosis Cardiovascular Patient has history of Peripheral Arterial Disease Endocrine Patient has history of Type II Diabetes - prediabetes Denies history of Type I Diabetes Genitourinary Denies history of End Stage Renal Disease Integumentary (Skin) Denies history of History of Burn Musculoskeletal Patient has history of Gout, Osteoarthritis Denies history of Rheumatoid Arthritis, Osteomyelitis Neurologic Patient has history of Neuropathy Oncologic Denies history of Received Chemotherapy, Received Radiation Psychiatric Denies history of Anorexia/bulimia, Confinement Anxiety Hospitalization/Surgery History - cholecystectomy. Medical A Surgical History Notes nd Ear/Nose/Mouth/Throat hard of hearing Objective KOPKA, Rilee ([id number]) [id number] Page 6 of 9 Constitutional respirations regular, non-labored and within target range for patient.. Vitals Time Taken: 2:09 PM, Temperature: 97.6 F, Pulse: 93 bpm, Respiratory Rate: 20 breaths/min, Blood Pressure: 118/75 mmHg, Capillary Blood Glucose: 119 mg/dl. Cardiovascular 2+ dorsalis  pedis/posterior tibialis pulses. Psychiatric pleasant and cooperative. General Notes: Third toe amputation site with granulation tissue and slough accumulation. No signs of surrounding infection. Integumentary (Hair, Skin) Wound #3 status is Open. Original cause of wound was Gradually Appeared. The date acquired was: 04/07/2022. The wound has been in treatment 10 weeks. The wound is located on the Right Amputation Site - T The wound measures 1.1cm length x 0.1cm width x 0.2cm depth; 0.086cm^2 area and 0.017cm^3 volume. oe. There is Fat Layer (Subcutaneous Tissue) exposed. There is no tunneling or undermining noted. There is a medium amount of serosanguineous drainage noted. The wound margin is epibole. There is large (67-100%) red, pink granulation within the wound bed. There is no necrotic tissue within the wound bed. The periwound skin appearance exhibited: Maceration. The periwound skin appearance did not exhibit: Callus, Crepitus, Excoriation, Induration, Rash, Scarring, Dry/Scaly, Atrophie Blanche, Cyanosis, Ecchymosis, Hemosiderin Staining, Mottled, Pallor, Rubor, Erythema. Periwound temperature was noted as No Abnormality. Assessment Active Problems ICD-10 Non-pressure chronic ulcer of other part of right foot with fat layer exposed Type 2 diabetes mellitus with foot  ulcer Atherosclerosis of native arteries of right leg with ulceration of other part of foot Peripheral vascular disease, unspecified Patient's wound has shown improvement in size and appearance since last clinic visit. I debrided nonviable tissue. I recommended continuing the course with collagen and blast X. Follow-up in 2 weeks. Procedures Wound #3 Pre-procedure diagnosis of Wound #3 is a Diabetic Wound/Ulcer of the Lower Extremity located on the Right Amputation Site - T .Severity of Tissue Pre oe Debridement is: Fat layer exposed. There was a Selective/Open Wound Non-Viable Tissue Debridement with a total area of 0.09  sq cm performed by [name], DO. With the following instrument(s): Curette to remove Viable and Non-Viable tissue/material. Material removed includes Slough after achieving pain control using Lidocaine 4% Topical Solution. No specimens were taken. A time out was conducted at 14:36, prior to the start of the procedure. A Minimum amount of bleeding was controlled with Pressure. The procedure was tolerated well with a pain level of 0 throughout and a pain level of 0 following the procedure. Post Debridement Measurements: 1.1cm length x 0.1cm width x 0.2cm depth; 0.017cm^3 volume. Character of Wound/Ulcer Post Debridement is improved. Severity of Tissue Post Debridement is: Fat layer exposed. Post procedure Diagnosis Wound #3: Same as Pre-Procedure General Notes: Scribed for Dr Mikey Bussing by Brenton Grills RN. Plan Follow-up Appointments: Return Appointment in 2 weeks. - Dr. Mikey Bussing Other: - [employer] blastx and collagen- jennifer is the rep. ****Call [facility] to return the wound care package.***** Anesthetic: (In clinic) Topical Lidocaine 5% applied to wound bed Bathing/ Shower/ Hygiene: May shower with protection but do not get wound dressing(s) wet. Protect dressing(s) with water repellant cover (for example, large plastic bag) or a cast cover and may then take shower. Edema Control - Lymphedema / SCD / Other: Elevate legs to the level of the heart or above for 30 minutes daily and/or when sitting for 3-4 times a day throughout the day. Avoid standing for long periods of time. WOUND #3: - Amputation Site - T oe Wound Laterality: Right Cleanser: Vashe 5.8 (oz) 1 x Per Day/30 Days Discharge Instructions: Cleanse the wound with Vashe prior to applying a clean dressing using gauze sponges, not tissue or cotton balls. Topical: Blastex (Generic) 1 x Per Day/30 Days Prim Dressing: Promogran Prisma Matrix, 4.34 (sq in) (silver collagen) (Generic) 1 x Per Day/30 Days ary Discharge Instructions:  Moisten collagen with saline or hydrogel Secondary Dressing: Woven Gauze Sponge, Non-Sterile 4x4 in (Generic) 1 x Per Day/30 Days Discharge Instructions: Apply over primary dressing as directed. Secured With: Paper T ape, 2x10 (in/yd) 1 x Per Day/30 Days Discharge Instructions: Secure dressing with tape as directed. [name], [name] ([id number]) [id number] Page 7 of 9 1. In office sharp debridement 2. Collagen with blast X 3. Follow-up in 2 weeks Electronic Signature(s) Signed: 09/16/2022 9:53:20 AM By: [name] DO Entered By: [name] on 09/15/2022 14:46:10 -------------------------------------------------------------------------------- HxROS Details Patient Name: Date of Service: [name] [name] 09/15/2022 2:00 PM Medical Record Number: [id number] Patient Account Number: [id number] Date of Birth/Sex: Treating RN: 26-Sep-1933 (89 y.o. M) Primary Care Provider: [name] Other Clinician: Referring Provider: Treating Provider/Extender: Caffie Pinto in Treatment: 10 Information Obtained From Patient Eyes Medical History: Positive for: Cataracts - removed both eyes Negative for: Glaucoma; Optic Neuritis Ear/Nose/Mouth/Throat Medical History: Negative for: Chronic sinus problems/congestion; Middle ear problems Past Medical History Notes: hard of hearing Hematologic/Lymphatic Medical History: Positive for: Anemia Negative for: Hemophilia; Human Immunodeficiency Virus; Lymphedema; Sickle Cell  Disease Respiratory Medical History: Positive for: Asthma; Chronic Obstructive Pulmonary Disease (COPD) Negative for: Aspiration; Pneumothorax; Sleep Apnea; Tuberculosis Cardiovascular Medical History: Positive for: Peripheral Arterial Disease Endocrine Medical History: Positive for: Type II Diabetes - prediabetes Negative for: Type I Diabetes Time with diabetes: 3 yrs ago Treated with: Diet Blood sugar  tested every day: No Genitourinary Medical History: Negative for: End Stage Renal Disease Integumentary (Skin) [name], [name] ([id number]) [id number] Page 8 of 9 Medical History: Negative for: History of Burn Musculoskeletal Medical History: Positive for: Gout; Osteoarthritis Negative for: Rheumatoid Arthritis; Osteomyelitis Neurologic Medical History: Positive for: Neuropathy Oncologic Medical History: Negative for: Received Chemotherapy; Received Radiation Psychiatric Medical History: Negative for: Anorexia/bulimia; Confinement Anxiety HBO Extended History Items Eyes: Cataracts Immunizations Pneumococcal Vaccine: Received Pneumococcal Vaccination: Yes Received Pneumococcal Vaccination On or After 60th Birthday: Yes Implantable Devices None Hospitalization / Surgery History Type of Hospitalization/Surgery cholecystectomy Family and Social History Cancer: Yes - Siblings; Diabetes: Yes - Siblings,Child; Heart Disease: Yes - Siblings; Hereditary Spherocytosis: No; Hypertension: Yes - Father,Siblings; Kidney Disease: No; Lung Disease: Yes - Siblings; Seizures: No; Stroke: Yes - Siblings; Thyroid Problems: No; Tuberculosis: No; Former smoker - quit 10 yrs ago; Marital Status - Married; Alcohol Use: Never; Drug Use: No History; Caffeine Use: Daily - coffee; Financial Concerns: No; Food, Clothing or Shelter Needs: No; Support System Lacking: No; Transportation Concerns: No Electronic Signature(s) Signed: 09/16/2022 9:53:20 AM By: [name] DO Entered By: [name] on 09/15/2022 14:44:50 -------------------------------------------------------------------------------- SuperBill Details Patient Name: Date of Service: [name] [name] 09/15/2022 Medical Record Number: [id number] Patient Account Number: [id number] Date of Birth/Sex: Treating RN: 02-03-1934 (89 y.o. M) Primary Care Provider: [name] Other  Clinician: Referring Provider: Treating Provider/Extender: Caffie Pinto in Treatment: 10 Diagnosis Coding ICD-10 Codes Code Description [phone] Non-pressure chronic ulcer of other part of right foot with fat layer exposed E11.621 Type 2 diabetes mellitus with foot ulcer [name], [name] ([id number]) [id number] Page 9 of 9 I70.235 Atherosclerosis of native arteries of right leg with ulceration of other part of foot I73.9 Peripheral vascular disease, unspecified Facility Procedures : CPT4 Code: [id number] Description: [phone] - DEBRIDE WOUND 1ST 20 SQ CM OR < ICD-10 Diagnosis Description L97.512 Non-pressure chronic ulcer of other part of right foot with fat layer exposed E11.621 Type 2 diabetes mellitus with foot ulcer Modifier: Quantity: 1 Physician Procedures : CPT4 Code Description Modifier [id number] - WC PHYS DEBR WO ANESTH 20 SQ CM ICD-10 Diagnosis Description L97.512 Non-pressure chronic ulcer of other part of right foot with fat layer exposed E11.621 Type 2 diabetes mellitus with foot ulcer Quantity: 1 Electronic Signature(s) Signed: 09/16/2022 9:53:20 AM By: [name] DO Entered By: [name] on 09/15/2022 14:46:24

## 2022-09-21 NOTE — Progress Notes (Signed)
[name], [name] ([id number]) [id number] Page 1 of 6 Visit Report for 09/15/2022 Arrival Information Details Patient Name: Date of Service: [name] 09/15/2022 2:00 PM Medical Record Number: [id number] Patient Account Number: [id number] Date of Birth/Sex: Treating RN: 1933-05-30 (89 y.o. M) Primary Care Ridge Lafond: [name] Other Clinician: Referring Alexande Sheerin: Treating Grainne Knights/Extender: Caffie Pinto in Treatment: 10 Visit Information History Since Last Visit Added or deleted any medications: No Patient Arrived: Ambulatory Any new allergies or adverse reactions: No Arrival Time: 14:07 Had a fall or experienced change in No Accompanied By: daughter activities of daily living that may affect Transfer Assistance: None risk of falls: Patient Identification Verified: Yes Signs or symptoms of abuse/neglect since last visito No Secondary Verification Process Completed: Yes Hospitalized since last visit: No Patient Requires Transmission-Based Precautions: No Implantable device outside of the clinic excluding No Patient Has Alerts: Yes cellular tissue based products placed in the center Patient Alerts: Patient on Blood Thinner since last visit: Has Dressing in Place as Prescribed: Yes Pain Present Now: No Electronic Signature(s) Signed: 09/16/2022 1:10:34 PM By: Thayer Dallas Entered By: Thayer Dallas on 09/15/2022 14:09:42 -------------------------------------------------------------------------------- Lower Extremity Assessment Details Patient Name: Date of Service: [name] 09/15/2022 2:00 PM Medical Record Number: [id number] Patient Account Number: [id number] Date of Birth/Sex: Treating RN: [birth year] (89 y.o. M) Primary Care Casidee Jann: [name] Other Clinician: Referring Denisa Enterline: Treating Caliann Leckrone/Extender: Charlyne Mom Weeks in Treatment: 10 Edema  Assessment Assessed: [Left: No] [Right: No] Edema: [Left: N] [Right: o] Calf Left: Right: Point of Measurement: From Medial Instep 33.6 cm Ankle Left: Right: Point of Measurement: From Medial Instep 20.1 cm Electronic Signature(s) Signed: 09/16/2022 1:10:34 PM By: Thayer Dallas Entered By: Thayer Dallas on 09/15/2022 14:18:14 [name], [name] ([id number]) [id number] Page 2 of 6 -------------------------------------------------------------------------------- Multi Wound Chart Details Patient Name: Date of Service: [name] 09/15/2022 2:00 PM Medical Record Number: [id number] Patient Account Number: [id number] Date of Birth/Sex: Treating RN: 06-22-1933 (89 y.o. M) Primary Care Leaman Abe: [name] Other Clinician: Referring Alveda Vanhorne: Treating Argus Caraher/Extender: Caffie Pinto in Treatment: 10 Vital Signs Height(in): Capillary Blood Glucose(mg/dl): 644 Weight(lbs): Pulse(bpm): 93 Body Mass Index(BMI): Blood Pressure(mmHg): 118/75 Temperature(F): 97.6 Respiratory Rate(breaths/min): 20 [3:Photos:] [N/A:N/A] Right Amputation Site - Toe N/A N/A Wound Location: Gradually Appeared N/A N/A Wounding Event: Diabetic Wound/Ulcer of the Lower N/A N/A Primary Etiology: Extremity Cataracts, Anemia, Asthma, Chronic N/A N/A Comorbid History: Obstructive Pulmonary Disease (COPD), Peripheral Arterial Disease, Type II Diabetes, Gout, Osteoarthritis, Neuropathy 04/07/2022 N/A N/A Date Acquired: 10 N/A N/A Weeks of Treatment: Open N/A N/A Wound Status: No N/A N/A Wound Recurrence: 1.1x0.1x0.2 N/A N/A Measurements L x W x D (cm) 0.086 N/A N/A A (cm) : rea 0.017 N/A N/A Volume (cm) : 97.30% N/A N/A % Reduction in A rea: 98.20% N/A N/A % Reduction in Volume: Grade 1 N/A N/A Classification: Medium N/A N/A Exudate A mount: Serosanguineous N/A N/A Exudate Type: red, brown N/A N/A Exudate Color: Epibole  N/A N/A Wound Margin: Large (67-100%) N/A N/A Granulation A mount: Red, Pink N/A N/A Granulation Quality: None Present (0%) N/A N/A Necrotic A mount: Fat Layer (Subcutaneous Tissue): Yes N/A N/A Exposed Structures: Fascia: No Tendon: No Muscle: No Joint: No Bone: No Medium (34-66%) N/A N/A Epithelialization: Debridement - Selective/Open Wound N/A N/A Debridement: Pre-procedure Verification/Time Out 14:36 N/A N/A Taken: Lidocaine 4% Topical Solution N/A N/A Pain Control: Slough N/A N/A Tissue Debrided: Non-Viable Tissue N/A N/A Level: 0.09 N/A N/A  Debridement A (sq cm): rea Curette N/A N/A Instrument: Minimum N/A N/A Bleeding: Pressure N/A N/A Hemostasis A chieved: 0 N/A N/A Procedural Pain: 0 N/A N/A Post Procedural Pain: Procedure was tolerated well N/A N/A Debridement Treatment Response: 1.1x0.1x0.2 N/A N/A Post Debridement Measurements L x [name], [name] ([id number]) [id number] Page 3 of 6 W x D (cm) 0.017 N/A N/A Post Debridement Volume: (cm) Excoriation: No N/A N/A Periwound Skin Texture: Induration: No Callus: No Crepitus: No Rash: No Scarring: No Maceration: Yes N/A N/A Periwound Skin Moisture: Dry/Scaly: No Atrophie Blanche: No N/A N/A Periwound Skin Color: Cyanosis: No Ecchymosis: No Erythema: No Hemosiderin Staining: No Mottled: No Pallor: No Rubor: No No Abnormality N/A N/A Temperature: Debridement N/A N/A Procedures Performed: Treatment Notes Electronic Signature(s) Signed: 09/16/2022 9:53:20 AM By: [name] DO Entered By: [name] on 09/15/2022 14:44:12 -------------------------------------------------------------------------------- Multi-Disciplinary Care Plan Details Patient Name: Date of Service: [name] 09/15/2022 2:00 PM Medical Record Number: [id number] Patient Account Number: [id number] Date of Birth/Sex: Treating RN: 13-Jun-1933 (89 y.o. [name] Primary Care Jahlil Ziller: [name] Other Clinician: Referring Labrina Lines: Treating Trevonn Hallum/Extender: Caffie Pinto in Treatment: 10 Active Inactive Wound/Skin Impairment Nursing Diagnoses: Impaired tissue integrity Knowledge deficit related to ulceration/compromised skin integrity Goals: Patient will have a decrease in wound volume by X% from date: (specify in notes) Date Initiated: 07/04/2022 Target Resolution Date: 11/05/2022 Goal Status: Active Patient/caregiver will verbalize understanding of skin care regimen Date Initiated: 07/04/2022 Target Resolution Date: 11/05/2022 Goal Status: Active Ulcer/skin breakdown will have a volume reduction of 30% by week 4 Date Initiated: 07/04/2022 Target Resolution Date: 11/05/2022 Goal Status: Active Ulcer/skin breakdown will have a volume reduction of 50% by week 8 Date Initiated: 07/04/2022 Target Resolution Date: 11/05/2022 Goal Status: Active Interventions: Assess patient/caregiver ability to obtain necessary supplies Assess patient/caregiver ability to perform ulcer/skin care regimen upon admission and as needed Assess ulceration(s) every visit Notes: Electronic Signature(s) Signed: 09/21/2022 7:59:22 AM By: Manning Charity, Tannor 7:59:22 AM By: Brenton Grills Signed: 09/21/2022 ([id number]) [id number] Page 4 of 6 Entered By: Brenton Grills on 09/15/2022 14:25:16 -------------------------------------------------------------------------------- Pain Assessment Details Patient Name: Date of Service: [name] 09/15/2022 2:00 PM Medical Record Number: [id number] Patient Account Number: [id number] Date of Birth/Sex: Treating RN: Mar 10, 1933 (89 y.o. M) Primary Care Phylicia Mcgaugh: [name] Other Clinician: Referring Andreina Outten: Treating Cyanna Neace/Extender: Caffie Pinto in Treatment: 10 Active Problems Location of Pain Severity and  Description of Pain Patient Has Paino No Site Locations Pain Management and Medication Current Pain Management: Electronic Signature(s) Signed: 09/16/2022 1:10:34 PM By: Thayer Dallas Entered By: Thayer Dallas on 09/15/2022 14:09:53 -------------------------------------------------------------------------------- Patient/Caregiver Education Details Patient Name: Date of Service: [name] 7/11/2024andnbsp2:00 PM Medical Record Number: [id number] Patient Account Number: [id number] Date of Birth/Gender: Treating RN: 05-17-1933 (89 y.o. [name] Primary Care Physician: [name] Other Clinician: Referring Physician: Treating Physician/Extender: Caffie Pinto in Treatment: 10 Education Assessment Education Provided To: Patient and Caregiver Education Topics Provided [place], Nadara Mode ([id number]) [id number] Page 5 of 6 Wound/Skin Impairment: Methods: Explain/Verbal Responses: State content correctly Electronic Signature(s) Signed: 09/21/2022 7:59:22 AM By: Brenton Grills Entered By: Brenton Grills on 09/15/2022 14:26:23 -------------------------------------------------------------------------------- Wound Assessment Details Patient Name: Date of Service: [name] 09/15/2022 2:00 PM Medical Record Number: [id number] Patient Account Number: [id number] Date of Birth/Sex: Treating RN: [birth year] (89 y.o. M) Primary Care Baran Kuhrt: [name] Other Clinician: Referring Ferrin Liebig: Treating Breyon Blass/Extender: Kathryne Gin,  Brandon Weeks in Treatment: 10 Wound Status Wound Number: 3 Primary Diabetic Wound/Ulcer of the Lower Extremity Etiology: Wound Location: Right Amputation Site - Toe Wound Open Wounding Event: Gradually Appeared Status: Date Acquired: 04/07/2022 Comorbid Cataracts, Anemia, Asthma, Chronic Obstructive Pulmonary Weeks Of Treatment: 10 History: Disease (COPD),  Peripheral Arterial Disease, Type II Diabetes, Clustered Wound: No Gout, Osteoarthritis, Neuropathy Photos Wound Measurements Length: (cm) 1.1 Width: (cm) 0.1 Depth: (cm) 0.2 Area: (cm) 0.086 Volume: (cm) 0.017 % Reduction in Area: 97.3% % Reduction in Volume: 98.2% Epithelialization: Medium (34-66%) Tunneling: No Undermining: No Wound Description Classification: Grade 1 Wound Margin: Epibole Exudate Amount: Medium Exudate Type: Serosanguineous Exudate Color: red, brown Foul Odor After Cleansing: No Slough/Fibrino Yes Wound Bed Granulation Amount: Large (67-100%) Exposed Structure Granulation Quality: Red, Pink Fascia Exposed: No Necrotic Amount: None Present (0%) Fat Layer (Subcutaneous Tissue) Exposed: Yes Tendon Exposed: No Muscle Exposed: No Joint Exposed: No Bone Exposed: No Periwound Skin Texture [name], [name] ([id number]) [id number] Page 6 of 6 Texture Color No Abnormalities Noted: No No Abnormalities Noted: No Callus: No Atrophie Blanche: No Crepitus: No Cyanosis: No Excoriation: No Ecchymosis: No Induration: No Erythema: No Rash: No Hemosiderin Staining: No Scarring: No Mottled: No Pallor: No Moisture Rubor: No No Abnormalities Noted: No Dry / Scaly: No Temperature / Pain Maceration: Yes Temperature: No Abnormality Electronic Signature(s) Signed: 09/16/2022 1:10:34 PM By: Thayer Dallas Entered By: Thayer Dallas on 09/15/2022 14:20:16 -------------------------------------------------------------------------------- Vitals Details Patient Name: Date of Service: [name] 09/15/2022 2:00 PM Medical Record Number: [id number] Patient Account Number: [id number] Date of Birth/Sex: Treating RN: 1934-02-24 (89 y.o. M) Primary Care Sherika Kubicki: [name] Other Clinician: Referring Eura Mccauslin: Treating Tienna Bienkowski/Extender: Caffie Pinto in Treatment: 10 Vital Signs Time Taken:  14:09 Temperature (F): 97.6 Pulse (bpm): 93 Respiratory Rate (breaths/min): 20 Blood Pressure (mmHg): 118/75 Capillary Blood Glucose (mg/dl): 086 Reference Range: 80 - 120 mg / dl Electronic Signature(s) Signed: 09/16/2022 1:10:34 PM By: Thayer Dallas Entered By: Thayer Dallas on 09/15/2022 14:19:23

## 2022-09-23 ENCOUNTER — Ambulatory Visit: Payer: PPO | Admitting: Podiatry

## 2022-09-29 ENCOUNTER — Encounter (HOSPITAL_BASED_OUTPATIENT_CLINIC_OR_DEPARTMENT_OTHER): Payer: PPO | Admitting: Internal Medicine

## 2022-09-29 DIAGNOSIS — L97512 Non-pressure chronic ulcer of other part of right foot with fat layer exposed: Secondary | ICD-10-CM | POA: Diagnosis not present

## 2022-09-29 DIAGNOSIS — I739 Peripheral vascular disease, unspecified: Secondary | ICD-10-CM | POA: Diagnosis not present

## 2022-09-29 DIAGNOSIS — E11621 Type 2 diabetes mellitus with foot ulcer: Secondary | ICD-10-CM | POA: Diagnosis not present

## 2022-09-29 NOTE — Progress Notes (Signed)
[name], [name] ([id number]) [id number] Page 1 of 8 Visit Report for 09/29/2022 Chief Complaint Document Details Patient Name: Date of Service: [name] [name] 09/29/2022 12:30 PM Medical Record Number: [id number] Patient Account Number: [id number] Date of Birth/Sex: Treating RN: October 17, 1933 (89 y.o. M) Primary Care Provider: [name] Other Clinician: Referring Provider: Treating Provider/Extender: Caffie Pinto in Treatment: 12 Information Obtained from: Patient Chief Complaint 07/04/2022; right foot wound Electronic Signature(s) Signed: 09/29/2022 3:19:04 PM By: [name] DO Entered By: [name] on 09/29/2022 14:30:37 -------------------------------------------------------------------------------- HPI Details Patient Name: Date of Service: [name] [name] 09/29/2022 12:30 PM Medical Record Number: [id number] Patient Account Number: [id number] Date of Birth/Sex: Treating RN: April 06, 1933 (88 y.o. M) Primary Care Provider: [name] Other Clinician: Referring Provider: Treating Provider/Extender: Caffie Pinto in Treatment: 12 History of Present Illness HPI Description: 07/04/2022 [name] is an 89 year old male with a past medical history of uncontrolled, insulin dependent type 2 diabetes, CKD stage III, and COPD that presents to the clinic for a right foot wound. He has an extensive history of peripheral arterial disease to the right leg. He has had a right superficial femoral to posterior tibial artery bypass by Dr. Elonda Husky in 2020. He had a right common femoral to plantar posterior tibial artery bypass on 02/03/2022 by Dr. Lenell Antu and at that time he also had a right toe amputation. He states this wound site had healed completely. Prior to this he had amputations to the first and second digit to the right foot. He presents today with an open wound to the  previous right toe amputation site for the past 2 months. He has been following with vein and vascular for this issue. Most recent ABIs were on 06/22/2022 that showed absent waveforms in the right lower extremity. Currently his right lower extremity bypass is occluded and no plan for another anteriorgram and has he likely has no revascularization options. Currently he has been using Betadine to the wound bed. He denies signs of infection. 07/11/2022 patient arrives in clinic today using Hydrofera Blue on the toe amputation site. The patient is revascularized and is not felt to have any other options for revascularization. We are using Hydrofera Blue and gauze. 5/13; patient presents for follow-up. He has been using Hydrofera Blue to the toe amputation site. He has had no issues. Wound is smaller. He has been approved for [facility] and was agreeable to have this placed in office today. 5/20; patient presents for follow-up. We have been using Kerecis to the wound bed. There is been improvement in healing. Unfortunately patient did not change the outer dressing for a week. There is slight maceration to the periwound. Unclear why he did not do this. 5/30; patient presents for follow-up. We have been using Kerecis to the wound bed. More maceration to the periwound today. No signs of infection. 6/10; patient presents for follow-up. He has been using Hydrofera Blue to the wound bed. He has no issues or complaints today. Maceration has almost completely resolved. 6/18; patient presents for follow-up. He has been using blast X and collagen to the wound bed without issues. Wound appears smaller today. 6/28; patient presents for follow-up. He has been using blast X and collagen to the wound bed. The wound is smaller. 7/11; patient presents for follow-up. He has been using blast X and collagen to the wound bed. Wound is smaller. He has slough accumulation. 7/25; patient presents for follow-up. He has been using blast  X and collagen to the wound bed. Wound is stable. He denies signs of infection. [name], [name] ([id number]) [id number] Page 2 of 8 Electronic Signature(s) Signed: 09/29/2022 3:19:04 PM By: [name] DO Entered By: [name] on 09/29/2022 14:31:08 -------------------------------------------------------------------------------- Physical Exam Details Patient Name: Date of Service: [name] [name] 09/29/2022 12:30 PM Medical Record Number: [id number] Patient Account Number: [id number] Date of Birth/Sex: Treating RN: 1933/05/02 (89 y.o. M) Primary Care Provider: [name] Other Clinician: Referring Provider: Treating Provider/Extender: Caffie Pinto in Treatment: 12 Constitutional respirations regular, non-labored and within target range for patient.. Cardiovascular 2+ dorsalis pedis/posterior tibialis pulses. Psychiatric pleasant and cooperative. Notes Third toe amputation site with granulation tissue. No signs of surrounding infection. Electronic Signature(s) Signed: 09/29/2022 3:19:04 PM By: [name] DO Entered By: [name] on 09/29/2022 14:31:39 -------------------------------------------------------------------------------- Physician Orders Details Patient Name: Date of Service: [name] [name] 09/29/2022 12:30 PM Medical Record Number: [id number] Patient Account Number: [id number] Date of Birth/Sex: Treating RN: 1933/06/06 (89 y.o. [name] Primary Care Provider: [name] Other Clinician: Referring Provider: Treating Provider/Extender: Caffie Pinto in Treatment: 12 Verbal / Phone Orders: No Diagnosis Coding Follow-up Appointments ppointment in 2 weeks. - Dr. Mikey Bussing Return A Other: - [employer] blastx and collagen- jennifer is the rep. ****Call [facility] to return the wound care package.***** Anesthetic (In clinic) Topical  Lidocaine 5% applied to wound bed Bathing/ Shower/ Hygiene May shower with protection but do not get wound dressing(s) wet. Protect dressing(s) with water repellant cover (for example, large plastic bag) or a cast cover and may then take shower. Edema Control - Lymphedema / SCD / Other Elevate legs to the level of the heart or above for 30 minutes daily and/or when sitting for 3-4 times a day throughout the day. Avoid standing for long periods of time. [name], [name] ([id number]) [id number] Page 3 of 8 Wound Treatment Wound #3 - Amputation Site - Toe Wound Laterality: Right Cleanser: Vashe 5.8 (oz) 1 x Per Day/30 Days Discharge Instructions: Cleanse the wound with Vashe prior to applying a clean dressing using gauze sponges, not tissue or cotton balls. Topical: Blastex (Generic) 1 x Per Day/30 Days Prim Dressing: Promogran Prisma Matrix, 4.34 (sq in) (silver collagen) (Generic) 1 x Per Day/30 Days ary Discharge Instructions: Moisten collagen with saline or hydrogel Secondary Dressing: Woven Gauze Sponge, Non-Sterile 4x4 in (Generic) 1 x Per Day/30 Days Discharge Instructions: Apply over primary dressing as directed. Secured With: Insurance underwriter, Sterile 2x75 (in/in) (Generic) 1 x Per Day/30 Days Discharge Instructions: Secure with stretch gauze as directed. Secured With: Paper Tape, 2x10 (in/yd) 1 x Per Day/30 Days Discharge Instructions: Secure dressing with tape as directed. Patient Medications llergies: No Known Allergies A Notifications Medication Indication Start End 09/29/2022 lidocaine DOSE topical 4 % cream - cream topical once daily Electronic Signature(s) Signed: 09/29/2022 3:19:04 PM By: [name] DO Entered By: [name] on 09/29/2022 14:31:47 -------------------------------------------------------------------------------- Problem List Details Patient Name: Date of Service: [name] [name] 09/29/2022  12:30 PM Medical Record Number: [id number] Patient Account Number: [id number] Date of Birth/Sex: Treating RN: July 02, 1933 (89 y.o. M) Primary Care Provider: [name] Other Clinician: Referring Provider: Treating Provider/Extender: Caffie Pinto in Treatment: 12 Active Problems ICD-10 Encounter Code Description Active Date MDM Diagnosis L97.512 Non-pressure chronic ulcer of other part of right foot with fat layer exposed 07/04/2022 No Yes E11.621 Type 2 diabetes mellitus with foot ulcer 07/04/2022 No Yes  I70.235 Atherosclerosis of native arteries of right leg with ulceration of other part of 07/04/2022 No Yes foot I73.9 Peripheral vascular disease, unspecified 07/04/2022 No Yes Inactive Problems [name], [name] ([id number]) [phone].pdf Page 4 of 8 Resolved Problems Electronic Signature(s) Signed: 09/29/2022 3:19:04 PM By: [name] DO Entered By: [name] on 09/29/2022 14:30:23 -------------------------------------------------------------------------------- Progress Note Details Patient Name: Date of Service: [name] [name] 09/29/2022 12:30 PM Medical Record Number: [id number] Patient Account Number: [id number] Date of Birth/Sex: Treating RN: 11/21/1933 (89 y.o. M) Primary Care Provider: [name] Other Clinician: Referring Provider: Treating Provider/Extender: Caffie Pinto in Treatment: 12 Subjective Chief Complaint Information obtained from Patient 07/04/2022; right foot wound History of Present Illness (HPI) 07/04/2022 Mr. Javontae Marlette is an 89 year old male with a past medical history of uncontrolled, insulin dependent type 2 diabetes, CKD stage III, and COPD that presents to the clinic for a right foot wound. He has an extensive history of peripheral arterial disease to the right leg. He has had a right superficial femoral to posterior tibial artery bypass by  Dr. Elonda Husky in 2020. He had a right common femoral to plantar posterior tibial artery bypass on 02/03/2022 by Dr. Lenell Antu and at that time he also had a right toe amputation. He states this wound site had healed completely. Prior to this he had amputations to the first and second digit to the right foot. He presents today with an open wound to the previous right toe amputation site for the past 2 months. He has been following with vein and vascular for this issue. Most recent ABIs were on 06/22/2022 that showed absent waveforms in the right lower extremity. Currently his right lower extremity bypass is occluded and no plan for another anteriorgram and has he likely has no revascularization options. Currently he has been using Betadine to the wound bed. He denies signs of infection. 07/11/2022 patient arrives in clinic today using Hydrofera Blue on the toe amputation site. The patient is revascularized and is not felt to have any other options for revascularization. We are using Hydrofera Blue and gauze. 5/13; patient presents for follow-up. He has been using Hydrofera Blue to the toe amputation site. He has had no issues. Wound is smaller. He has been approved for [facility] and was agreeable to have this placed in office today. 5/20; patient presents for follow-up. We have been using Kerecis to the wound bed. There is been improvement in healing. Unfortunately patient did not change the outer dressing for a week. There is slight maceration to the periwound. Unclear why he did not do this. 5/30; patient presents for follow-up. We have been using Kerecis to the wound bed. More maceration to the periwound today. No signs of infection. 6/10; patient presents for follow-up. He has been using Hydrofera Blue to the wound bed. He has no issues or complaints today. Maceration has almost completely resolved. 6/18; patient presents for follow-up. He has been using blast X and collagen to the wound bed without issues.  Wound appears smaller today. 6/28; patient presents for follow-up. He has been using blast X and collagen to the wound bed. The wound is smaller. 7/11; patient presents for follow-up. He has been using blast X and collagen to the wound bed. Wound is smaller. He has slough accumulation. 7/25; patient presents for follow-up. He has been using blast X and collagen to the wound bed. Wound is stable. He denies signs of infection. Patient History Information obtained from Patient. Family History Cancer -  Siblings, Diabetes - Siblings,Child, Heart Disease - Siblings, Hypertension - Father,Siblings, Lung Disease - Siblings, Stroke - Siblings, No family history of Hereditary Spherocytosis, Kidney Disease, Seizures, Thyroid Problems, Tuberculosis. Social History Former smoker - quit 10 yrs ago, Marital Status - Married, Alcohol Use - Never, Drug Use - No History, Caffeine Use - Daily - coffee. Medical History Eyes Patient has history of Cataracts - removed both eyes Denies history of Glaucoma, Optic Neuritis Ear/Nose/Mouth/Throat Denies history of Chronic sinus problems/congestion, Middle ear problems Hematologic/Lymphatic Patient has history of Anemia Denies history of Hemophilia, Human Immunodeficiency Virus, Lymphedema, Sickle Cell Disease Respiratory [name], [name] ([id number]) [phone].pdf Page 5 of 8 Patient has history of Asthma, Chronic Obstructive Pulmonary Disease (COPD) Denies history of Aspiration, Pneumothorax, Sleep Apnea, Tuberculosis Cardiovascular Patient has history of Peripheral Arterial Disease Endocrine Patient has history of Type II Diabetes - prediabetes Denies history of Type I Diabetes Genitourinary Denies history of End Stage Renal Disease Integumentary (Skin) Denies history of History of Burn Musculoskeletal Patient has history of Gout, Osteoarthritis Denies history of Rheumatoid Arthritis, Osteomyelitis Neurologic Patient has  history of Neuropathy Oncologic Denies history of Received Chemotherapy, Received Radiation Psychiatric Denies history of Anorexia/bulimia, Confinement Anxiety Hospitalization/Surgery History - cholecystectomy. Medical A Surgical History Notes nd Ear/Nose/Mouth/Throat hard of hearing Objective Constitutional respirations regular, non-labored and within target range for patient.. Vitals Time Taken: 12:42 PM, Temperature: 97.9 F, Pulse: 93 bpm, Respiratory Rate: 18 breaths/min, Blood Pressure: 129/72 mmHg, Capillary Blood Glucose: 93 mg/dl. Cardiovascular 2+ dorsalis pedis/posterior tibialis pulses. Psychiatric pleasant and cooperative. General Notes: Third toe amputation site with granulation tissue. No signs of surrounding infection. Integumentary (Hair, Skin) Wound #3 status is Open. Original cause of wound was Gradually Appeared. The date acquired was: 04/07/2022. The wound has been in treatment 12 weeks. The wound is located on the Right Amputation Site - T The wound measures 1.3cm length x 0.4cm width x 0.2cm depth; 0.408cm^2 area and 0.082cm^3 volume. oe. There is Fat Layer (Subcutaneous Tissue) exposed. There is no tunneling or undermining noted. There is a medium amount of serosanguineous drainage noted. The wound margin is epibole. There is large (67-100%) red, pink granulation within the wound bed. There is no necrotic tissue within the wound bed. The periwound skin appearance exhibited: Maceration. The periwound skin appearance did not exhibit: Callus, Crepitus, Excoriation, Induration, Rash, Scarring, Dry/Scaly, Atrophie Blanche, Cyanosis, Ecchymosis, Hemosiderin Staining, Mottled, Pallor, Rubor, Erythema. Periwound temperature was noted as No Abnormality. Assessment Active Problems ICD-10 Non-pressure chronic ulcer of other part of right foot with fat layer exposed Type 2 diabetes mellitus with foot ulcer Atherosclerosis of native arteries of right leg with ulceration of  other part of foot Peripheral vascular disease, unspecified Patient's wound is stable. I recommended continuing collagen and blast X. Follow-up in 2 weeks. If no improvement in 2 weeks may need to consider a different dressing. Plan Follow-up Appointments: [name], [name] ([id number]) [id number] Page 6 of 8 Return Appointment in 2 weeks. - Dr. Mikey Bussing Other: - [employer] blastx and collagen- jennifer is the rep. ****Call [facility] to return the wound care package.***** Anesthetic: (In clinic) Topical Lidocaine 5% applied to wound bed Bathing/ Shower/ Hygiene: May shower with protection but do not get wound dressing(s) wet. Protect dressing(s) with water repellant cover (for example, large plastic bag) or a cast cover and may then take shower. Edema Control - Lymphedema / SCD / Other: Elevate legs to the level of the heart or above for 30 minutes daily and/or when sitting for 3-4 times  a day throughout the day. Avoid standing for long periods of time. The following medication(s) was prescribed: lidocaine topical 4 % cream cream topical once daily was prescribed at facility WOUND #3: - Amputation Site - T oe Wound Laterality: Right Cleanser: Vashe 5.8 (oz) 1 x Per Day/30 Days Discharge Instructions: Cleanse the wound with Vashe prior to applying a clean dressing using gauze sponges, not tissue or cotton balls. Topical: Blastex (Generic) 1 x Per Day/30 Days Prim Dressing: Promogran Prisma Matrix, 4.34 (sq in) (silver collagen) (Generic) 1 x Per Day/30 Days ary Discharge Instructions: Moisten collagen with saline or hydrogel Secondary Dressing: Woven Gauze Sponge, Non-Sterile 4x4 in (Generic) 1 x Per Day/30 Days Discharge Instructions: Apply over primary dressing as directed. Secured With: Insurance underwriter, Sterile 2x75 (in/in) (Generic) 1 x Per Day/30 Days Discharge Instructions: Secure with stretch gauze as directed. Secured With: Paper T ape,  2x10 (in/yd) 1 x Per Day/30 Days Discharge Instructions: Secure dressing with tape as directed. 1. Blast X and collagen 2. Follow-up in 2 weeks Electronic Signature(s) Signed: 09/29/2022 3:19:04 PM By: [name] DO Entered By: [name] on 09/29/2022 14:34:56 -------------------------------------------------------------------------------- HxROS Details Patient Name: Date of Service: [name] [name] 09/29/2022 12:30 PM Medical Record Number: [id number] Patient Account Number: [id number] Date of Birth/Sex: Treating RN: [birth year] (89 y.o. M) Primary Care Provider: [name] Other Clinician: Referring Provider: Treating Provider/Extender: Caffie Pinto in Treatment: 12 Information Obtained From Patient Eyes Medical History: Positive for: Cataracts - removed both eyes Negative for: Glaucoma; Optic Neuritis Ear/Nose/Mouth/Throat Medical History: Negative for: Chronic sinus problems/congestion; Middle ear problems Past Medical History Notes: hard of hearing Hematologic/Lymphatic Medical History: Positive for: Anemia Negative for: Hemophilia; Human Immunodeficiency Virus; Lymphedema; Sickle Cell Disease Respiratory Medical History: Positive for: Asthma; Chronic Obstructive Pulmonary Disease (COPD) Negative for: Aspiration; Pneumothorax; Sleep Apnea; Tuberculosis [name], [name] ([id number]) [id number] Page 7 of 8 Cardiovascular Medical History: Positive for: Peripheral Arterial Disease Endocrine Medical History: Positive for: Type II Diabetes - prediabetes Negative for: Type I Diabetes Time with diabetes: 3 yrs ago Treated with: Diet Blood sugar tested every day: No Genitourinary Medical History: Negative for: End Stage Renal Disease Integumentary (Skin) Medical History: Negative for: History of Burn Musculoskeletal Medical History: Positive for: Gout; Osteoarthritis Negative for:  Rheumatoid Arthritis; Osteomyelitis Neurologic Medical History: Positive for: Neuropathy Oncologic Medical History: Negative for: Received Chemotherapy; Received Radiation Psychiatric Medical History: Negative for: Anorexia/bulimia; Confinement Anxiety HBO Extended History Items Eyes: Cataracts Immunizations Pneumococcal Vaccine: Received Pneumococcal Vaccination: Yes Received Pneumococcal Vaccination On or After 60th Birthday: Yes Implantable Devices None Hospitalization / Surgery History Type of Hospitalization/Surgery cholecystectomy Family and Social History Cancer: Yes - Siblings; Diabetes: Yes - Siblings,Child; Heart Disease: Yes - Siblings; Hereditary Spherocytosis: No; Hypertension: Yes - Father,Siblings; Kidney Disease: No; Lung Disease: Yes - Siblings; Seizures: No; Stroke: Yes - Siblings; Thyroid Problems: No; Tuberculosis: No; Former smoker - quit 10 yrs ago; Marital Status - Married; Alcohol Use: Never; Drug Use: No History; Caffeine Use: Daily - coffee; Financial Concerns: No; Food, Clothing or Shelter Needs: No; Support System Lacking: No; Transportation Concerns: No Electronic Signature(s) Signed: 09/29/2022 3:19:04 PM By: [name] DO Entered By: [name] on 09/29/2022 14:31:14 [name], [name] ([id number]) [id number] Page 8 of 8 -------------------------------------------------------------------------------- SuperBill Details Patient Name: Date of Service: [name] [name] 09/29/2022 Medical Record Number: [id number] Patient Account Number: [id number] Date of Birth/Sex: Treating RN: 05-18-1933 (89 y.o. [name] Primary Care Provider: [name] Other Clinician:  Referring Provider: Treating Provider/Extender: Caffie Pinto in Treatment: 12 Diagnosis Coding ICD-10 Codes Code Description [phone] Non-pressure chronic ulcer of other part of right foot with fat layer  exposed E11.621 Type 2 diabetes mellitus with foot ulcer I70.235 Atherosclerosis of native arteries of right leg with ulceration of other part of foot I73.9 Peripheral vascular disease, unspecified Facility Procedures : CPT4 Code: 18841660 Description: 99213 - WOUND CARE VISIT-LEV 3 EST PT Modifier: Quantity: 1 Physician Procedures : CPT4 Code Description Modifier [id number] 99213 - WC PHYS LEVEL 3 - EST PT ICD-10 Diagnosis Description L97.512 Non-pressure chronic ulcer of other part of right foot with fat layer exposed E11.621 Type 2 diabetes mellitus with foot ulcer I70.235  Atherosclerosis of native arteries of right leg with ulceration of other part of foot I73.9 Peripheral vascular disease, unspecified Quantity: 1 Electronic Signature(s) Signed: 09/29/2022 3:19:04 PM By: [name] DO Entered By: [name] on 09/29/2022 14:35:08

## 2022-09-29 NOTE — Progress Notes (Signed)
LAPOINT, Kedarius ([id number]) [id number] Page 1 of 9 Visit Report for 09/29/2022 Arrival Information Details Patient Name: Date of Service: [name] 09/29/2022 12:30 PM Medical Record Number: [id number] Patient Account Number: [id number] Date of Birth/Sex: Treating RN: [birth year] (89 y.o. [name] Primary Care [name]: [name] Other Clinician: Referring [name]: Treating [name]/Extender: [name] in Treatment: 12 Visit Information History Since Last Visit Added or deleted any medications: No Patient Arrived: Ambulatory Any new allergies or adverse reactions: No Arrival Time: 12:42 Had a fall or experienced change in No Accompanied By: family activities of daily living that may affect Transfer Assistance: None risk of falls: Patient Identification Verified: Yes Signs or symptoms of abuse/neglect since last visito No Secondary Verification Process Completed: Yes Hospitalized since last visit: No Patient Requires Transmission-Based Precautions: No Implantable device outside of the clinic excluding No Patient Has Alerts: Yes cellular tissue based products placed in the center Patient Alerts: Patient on Blood Thinner since last visit: Pain Present Now: No Electronic Signature(s) Signed: 09/29/2022 4:00:15 PM By: [name] Pulling RN, BSN Entered By: [name] on 09/29/2022 13:46:00 -------------------------------------------------------------------------------- Clinic Level of Care Assessment Details Patient Name: Date of Service: [name] 09/29/2022 12:30 PM Medical Record Number: [id number] Patient Account Number: [id number] Date of Birth/Sex: Treating RN: 1933/07/01 (89 y.o. [name] Primary Care Kandance Yano: [name] Other Clinician: Referring Idara Woodside: Treating Vedder Brittian/Extender: [name] in Treatment: 12 Clinic Level of Care  Assessment Items TOOL 4 Quantity Score X- 1 0 Use when only an EandM is performed on FOLLOW-UP visit ASSESSMENTS - Nursing Assessment / Reassessment X- 1 10 Reassessment of Co-morbidities (includes updates in patient status) X- 1 5 Reassessment of Adherence to Treatment Plan ASSESSMENTS - Wound and Skin A ssessment / Reassessment X - Simple Wound Assessment / Reassessment - one wound 1 5 []  - 0 Complex Wound Assessment / Reassessment - multiple wounds []  - 0 Dermatologic / Skin Assessment (not related to wound area) ASSESSMENTS - Focused Assessment X- 1 5 Circumferential Edema Measurements - multi extremities []  - 0 Nutritional Assessment / Counseling / Intervention []  - 0 Lower Extremity Assessment (monofilament, tuning fork, pulses) [name] ([id number]) [id number] Page 2 of 9 []  - 0 Peripheral Arterial Disease Assessment (using hand held doppler) ASSESSMENTS - Ostomy and/or Continence Assessment and Care []  - 0 Incontinence Assessment and Management []  - 0 Ostomy Care Assessment and Management (repouching, etc.) PROCESS - Coordination of Care X - Simple Patient / Family Education for ongoing care 1 15 []  - 0 Complex (extensive) Patient / Family Education for ongoing care X- 1 10 Staff obtains Chiropractor, Records, T Results / Process Orders est []  - 0 Staff telephones HHA, Nursing Homes / Clarify orders / etc []  - 0 Routine Transfer to another Facility (non-emergent condition) []  - 0 Routine Hospital Admission (non-emergent condition) []  - 0 New Admissions / Manufacturing engineer / Ordering NPWT Apligraf, etc. , []  - 0 Emergency Hospital Admission (emergent condition) []  - 0 Simple Discharge Coordination []  - 0 Complex (extensive) Discharge Coordination PROCESS - Special Needs []  - 0 Pediatric / Minor Patient Management []  - 0 Isolation Patient Management []  - 0 Hearing / Language / Visual special needs []  -  0 Assessment of Community assistance (transportation, D/C planning, etc.) []  - 0 Additional assistance / Altered mentation []  - 0 Support Surface(s) Assessment (bed, cushion, seat, etc.) INTERVENTIONS - Wound Cleansing / Measurement X - Simple Wound Cleansing -  one wound 1 5 []  - 0 Complex Wound Cleansing - multiple wounds X- 1 5 Wound Imaging (photographs - any number of wounds) []  - 0 Wound Tracing (instead of photographs) X- 1 5 Simple Wound Measurement - one wound []  - 0 Complex Wound Measurement - multiple wounds INTERVENTIONS - Wound Dressings X - Small Wound Dressing one or multiple wounds 1 10 []  - 0 Medium Wound Dressing one or multiple wounds []  - 0 Large Wound Dressing one or multiple wounds X- 1 5 Application of Medications - topical []  - 0 Application of Medications - injection INTERVENTIONS - Miscellaneous []  - 0 External ear exam []  - 0 Specimen Collection (cultures, biopsies, blood, body fluids, etc.) []  - 0 Specimen(s) / Culture(s) sent or taken to Lab for analysis []  - 0 Patient Transfer (multiple staff / Nurse, adult / Similar devices) []  - 0 Simple Staple / Suture removal (25 or less) []  - 0 Complex Staple / Suture removal (26 or more) []  - 0 Hypo / Hyperglycemic Management (close monitor of Blood Glucose) []  - 0 Ankle / Brachial Index (ABI) - do not check if billed separately [name] ([id number]) [id number] Page 3 of 9 X- 1 5 Vital Signs Has the patient been seen at the hospital within the last three years: Yes Total Score: 85 Level Of Care: New/Established - Level 3 Electronic Signature(s) Signed: 09/29/2022 4:00:15 PM By: [name] Pulling RN, BSN Entered By: [name] on 09/29/2022 13:39:54 -------------------------------------------------------------------------------- Encounter Discharge Information Details Patient Name: Date of Service: [name] 09/29/2022 12:30 PM Medical Record  Number: [id number] Patient Account Number: [id number] Date of Birth/Sex: Treating RN: January 15, 1934 (89 y.o. [name] Primary Care Rustin Erhart: [name] Other Clinician: Referring Juliett Eastburn: Treating Ettel Albergo/Extender: [name] in Treatment: 12 Encounter Discharge Information Items Discharge Condition: Stable Ambulatory Status: Ambulatory Discharge Destination: Home Transportation: Private Auto Accompanied By: family Schedule Follow-up Appointment: Yes Clinical Summary of Care: Patient Declined Electronic Signature(s) Signed: 09/29/2022 4:00:15 PM By: [name] Pulling RN, BSN Entered By: [name] on 09/29/2022 13:45:42 -------------------------------------------------------------------------------- Lower Extremity Assessment Details Patient Name: Date of Service: [name] 09/29/2022 12:30 PM Medical Record Number: [id number] Patient Account Number: [id number] Date of Birth/Sex: Treating RN: [birth year] (89 y.o. [name] Primary Care Hermenegildo Clausen: [name] Other Clinician: Referring Gordy Goar: Treating Mahari Strahm/Extender: Charlyne Mom Weeks in Treatment: 12 Edema Assessment Assessed: [Left: No] [Right: No] Edema: [Left: N] [Right: o] Calf Left: Right: Point of Measurement: From Medial Instep 33 cm Ankle Left: Right: Point of Measurement: From Medial Instep 20.5 cm Electronic Signature(s) Signed: 09/29/2022 4:00:15 PM By: [name] Pulling RN, BSN [place], Marshon ([id number]) [id number].pdf Page 4 of 9 Entered By: [name] on 09/29/2022 12:47:12 -------------------------------------------------------------------------------- Multi Wound Chart Details Patient Name: Date of Service: [name] 09/29/2022 12:30 PM Medical Record Number: [id number] Patient Account Number: [id number] Date of Birth/Sex: Treating RN: [birth year] (89 y.o. M) Primary Care  Elianna Windom: [name] Other Clinician: Referring Jesus Nevills: Treating Andrue Dini/Extender: [name] in Treatment: 12 Vital Signs Height(in): Capillary Blood Glucose(mg/dl): 93 Weight(lbs): Pulse(bpm): 93 Body Mass Index(BMI): Blood Pressure(mmHg): 129/72 Temperature(F): 97.9 Respiratory Rate(breaths/min): 18 [3:Photos:] [N/A:N/A] Right Amputation Site - Toe N/A N/A Wound Location: Gradually Appeared N/A N/A Wounding Event: Diabetic Wound/Ulcer of the Lower N/A N/A Primary Etiology: Extremity Cataracts, Anemia, Asthma, Chronic N/A N/A Comorbid History: Obstructive Pulmonary Disease (COPD), Peripheral Arterial Disease, Type II Diabetes, Gout, Osteoarthritis, Neuropathy 04/07/2022 N/A N/A Date Acquired:  12 N/A N/A Weeks of Treatment: Open N/A N/A Wound Status: No N/A N/A Wound Recurrence: 1.3x0.4x0.2 N/A N/A Measurements L x W x D (cm) 0.408 N/A N/A A (cm) : rea 0.082 N/A N/A Volume (cm) : 87.20% N/A N/A % Reduction in A rea: 91.40% N/A N/A % Reduction in Volume: Grade 1 N/A N/A Classification: Medium N/A N/A Exudate A mount: Serosanguineous N/A N/A Exudate Type: red, brown N/A N/A Exudate Color: Epibole N/A N/A Wound Margin: Large (67-100%) N/A N/A Granulation A mount: Red, Pink N/A N/A Granulation Quality: None Present (0%) N/A N/A Necrotic A mount: Fat Layer (Subcutaneous Tissue): Yes N/A N/A Exposed Structures: Fascia: No Tendon: No Muscle: No Joint: No Bone: No Medium (34-66%) N/A N/A Epithelialization: Excoriation: No N/A N/A Periwound Skin Texture: Induration: No Callus: No Crepitus: No Rash: No Scarring: No Maceration: Yes N/A N/A Periwound Skin Moisture: Dry/Scaly: No Atrophie Blanche: No N/A N/A Periwound Skin Color: Cyanosis: No Ecchymosis: No Schlereth, Cyris ([id number]) [id number] Page 5 of 9 Erythema: No Hemosiderin Staining: No Mottled: No Pallor: No Rubor:  No No Abnormality N/A N/A Temperature: Treatment Notes Wound #3 (Amputation Site - Toe) Wound Laterality: Right Cleanser Vashe 5.8 (oz) Discharge Instruction: Cleanse the wound with Vashe prior to applying a clean dressing using gauze sponges, not tissue or cotton balls. Peri-Wound Care Topical Blastex Primary Dressing Promogran Prisma Matrix, 4.34 (sq in) (silver collagen) Discharge Instruction: Moisten collagen with saline or hydrogel Secondary Dressing Woven Gauze Sponge, Non-Sterile 4x4 in Discharge Instruction: Apply over primary dressing as directed. Secured With Conforming Stretch Gauze Bandage, Sterile 2x75 (in/in) Discharge Instruction: Secure with stretch gauze as directed. Paper Tape, 2x10 (in/yd) Discharge Instruction: Secure dressing with tape as directed. Compression Wrap Compression Stockings Add-Ons Electronic Signature(s) Signed: 09/29/2022 3:19:04 PM By: [name] DO Entered By: [name] on 09/29/2022 14:30:29 -------------------------------------------------------------------------------- Multi-Disciplinary Care Plan Details Patient Name: Date of Service: [name] 09/29/2022 12:30 PM Medical Record Number: [id number] Patient Account Number: [id number] Date of Birth/Sex: Treating RN: Apr 05, 1933 (89 y.o. [name] Primary Care Jeyson Deshotel: [name] Other Clinician: Referring Quenna Doepke: Treating Avy Barlett/Extender: [name] in Treatment: 12 Active Inactive Wound/Skin Impairment Nursing Diagnoses: Impaired tissue integrity Knowledge deficit related to ulceration/compromised skin integrity Goals: Patient will have a decrease in wound volume by X% from date: (specify in notes) Date Initiated: 07/04/2022 Target Resolution Date: 11/05/2022 Goal Status: Active BUTLER, Harlee ([id number]) [id number] Page 6 of 9 Patient/caregiver will verbalize understanding of skin  care regimen Date Initiated: 07/04/2022 Target Resolution Date: 11/05/2022 Goal Status: Active Ulcer/skin breakdown will have a volume reduction of 30% by week 4 Date Initiated: 07/04/2022 Target Resolution Date: 11/05/2022 Goal Status: Active Ulcer/skin breakdown will have a volume reduction of 50% by week 8 Date Initiated: 07/04/2022 Target Resolution Date: 11/05/2022 Goal Status: Active Interventions: Assess patient/caregiver ability to obtain necessary supplies Assess patient/caregiver ability to perform ulcer/skin care regimen upon admission and as needed Assess ulceration(s) every visit Notes: Electronic Signature(s) Signed: 09/29/2022 4:00:15 PM By: [name] Pulling RN, BSN Entered By: [name] on 09/29/2022 12:50:07 -------------------------------------------------------------------------------- Pain Assessment Details Patient Name: Date of Service: [name] 09/29/2022 12:30 PM Medical Record Number: [id number] Patient Account Number: [id number] Date of Birth/Sex: Treating RN: [birth year] (89 y.o. [name] Primary Care Tirzah Fross: [name] Other Clinician: Referring Norvin Ohlin: Treating Kerolos Nehme/Extender: [name] in Treatment: 12 Active Problems Location of Pain Severity and Description of Pain Patient Has Paino No Site Locations Pain Management  and Medication Current Pain Management: Electronic Signature(s) Signed: 09/29/2022 4:00:15 PM By: [name] Pulling RN, BSN Entered By: [name] on 09/29/2022 12:44:05 Kreeger, Shah ([id number]) [id number] Page 7 of 9 -------------------------------------------------------------------------------- Patient/Caregiver Education Details Patient Name: Date of Service: [name] 7/25/2024andnbsp12:30 PM Medical Record Number: [id number] Patient Account Number: [id number] Date of Birth/Gender: Treating RN: 1933-03-22 (89 y.o. [name] Primary Care Physician: [name] Other Clinician: Referring Physician: Treating Physician/Extender: [name] in Treatment: 12 Education Assessment Education Provided To: Patient Education Topics Provided Wound/Skin Impairment: Methods: Explain/Verbal Responses: State content correctly [employer]) Signed: 09/29/2022 4:00:15 PM By: [name] Pulling RN, BSN Entered By: [name] on 09/29/2022 12:50:29 -------------------------------------------------------------------------------- Wound Assessment Details Patient Name: Date of Service: [name] 09/29/2022 12:30 PM Medical Record Number: [id number] Patient Account Number: [id number] Date of Birth/Sex: Treating RN: [birth year] (89 y.o. [name] Primary Care Samier Jaco: [name] Other Clinician: Referring Annisha Baar: Treating Bruk Tumolo/Extender: [name] in Treatment: 12 Wound Status Wound Number: 3 Primary Diabetic Wound/Ulcer of the Lower Extremity Etiology: Wound Location: Right Amputation Site - Toe Wound Open Wounding Event: Gradually Appeared Status: Date Acquired: 04/07/2022 Comorbid Cataracts, Anemia, Asthma, Chronic Obstructive Pulmonary Weeks Of Treatment: 12 History: Disease (COPD), Peripheral Arterial Disease, Type II Diabetes, Clustered Wound: No Gout, Osteoarthritis, Neuropathy Photos Wound Measurements Length: (cm) 1.3 Width: (cm) 0.4 Haub, Jonanthony ([id number]) Depth: (cm) 0.2 Area: (cm) 0.408 Volume: (cm) 0.082 % Reduction in Area: 87.2% % Reduction in Volume: 91.4% [id number] Page 8 of 9 Epithelialization: Medium (34-66%) Tunneling: No Undermining: No Wound Description Classification: Grade 1 Wound Margin: Epibole Exudate Amount: Medium Exudate Type: Serosanguineous Exudate Color: red, brown Foul Odor After Cleansing: No Slough/Fibrino Yes Wound  Bed Granulation Amount: Large (67-100%) Exposed Structure Granulation Quality: Red, Pink Fascia Exposed: No Necrotic Amount: None Present (0%) Fat Layer (Subcutaneous Tissue) Exposed: Yes Tendon Exposed: No Muscle Exposed: No Joint Exposed: No Bone Exposed: No Periwound Skin Texture Texture Color No Abnormalities Noted: No No Abnormalities Noted: No Callus: No Atrophie Blanche: No Crepitus: No Cyanosis: No Excoriation: No Ecchymosis: No Induration: No Erythema: No Rash: No Hemosiderin Staining: No Scarring: No Mottled: No Pallor: No Moisture Rubor: No No Abnormalities Noted: No Dry / Scaly: No Temperature / Pain Maceration: Yes Temperature: No Abnormality Treatment Notes Wound #3 (Amputation Site - Toe) Wound Laterality: Right Cleanser Vashe 5.8 (oz) Discharge Instruction: Cleanse the wound with Vashe prior to applying a clean dressing using gauze sponges, not tissue or cotton balls. Peri-Wound Care Topical Blastex Primary Dressing Promogran Prisma Matrix, 4.34 (sq in) (silver collagen) Discharge Instruction: Moisten collagen with saline or hydrogel Secondary Dressing Woven Gauze Sponge, Non-Sterile 4x4 in Discharge Instruction: Apply over primary dressing as directed. Secured With Conforming Stretch Gauze Bandage, Sterile 2x75 (in/in) Discharge Instruction: Secure with stretch gauze as directed. Paper Tape, 2x10 (in/yd) Discharge Instruction: Secure dressing with tape as directed. Compression Wrap Compression Stockings Add-Ons Electronic Signature(s) Signed: 09/29/2022 4:00:15 PM By: [name] Pulling RN, BSN Entered By: [name] on 09/29/2022 12:49:50 Spires, Caryl ([id number]) [id number] Page 9 of 9 -------------------------------------------------------------------------------- Vitals Details Patient Name: Date of Service: [name] 09/29/2022 12:30 PM Medical Record Number: [id number] Patient Account  Number: [id number] Date of Birth/Sex: Treating RN: April 14, 1933 (89 y.o. [name] Primary Care Lantz Hermann: [name] Other Clinician: Referring Dinari Stgermaine: Treating Kevyn Wengert/Extender: [name] in Treatment: 12 Vital Signs Time Taken: 12:42 Temperature (F): 97.9 Pulse (bpm): 93 Respiratory  Rate (breaths/min): 18 Blood Pressure (mmHg): 129/72 Capillary Blood Glucose (mg/dl): 93 Reference Range: 80 - 120 mg / dl Electronic Signature(s) Signed: 09/29/2022 4:00:15 PM By: [name] Pulling RN, BSN Entered By: [name] on 09/29/2022 12:43:55

## 2022-10-03 ENCOUNTER — Ambulatory Visit: Payer: PPO | Admitting: Student

## 2022-10-06 ENCOUNTER — Ambulatory Visit (INDEPENDENT_AMBULATORY_CARE_PROVIDER_SITE_OTHER): Payer: PPO | Admitting: Podiatry

## 2022-10-06 ENCOUNTER — Ambulatory Visit: Payer: PPO | Admitting: Student

## 2022-10-06 ENCOUNTER — Other Ambulatory Visit: Payer: Self-pay

## 2022-10-06 DIAGNOSIS — M79675 Pain in left toe(s): Secondary | ICD-10-CM

## 2022-10-06 DIAGNOSIS — B351 Tinea unguium: Secondary | ICD-10-CM | POA: Diagnosis not present

## 2022-10-06 DIAGNOSIS — M79674 Pain in right toe(s): Secondary | ICD-10-CM | POA: Diagnosis not present

## 2022-10-06 DIAGNOSIS — M1A9XX0 Chronic gout, unspecified, without tophus (tophi): Secondary | ICD-10-CM

## 2022-10-06 DIAGNOSIS — L97512 Non-pressure chronic ulcer of other part of right foot with fat layer exposed: Secondary | ICD-10-CM

## 2022-10-06 DIAGNOSIS — E1151 Type 2 diabetes mellitus with diabetic peripheral angiopathy without gangrene: Secondary | ICD-10-CM

## 2022-10-06 DIAGNOSIS — I739 Peripheral vascular disease, unspecified: Secondary | ICD-10-CM

## 2022-10-06 MED ORDER — ALLOPURINOL 100 MG PO TABS: 100.0000 mg | ORAL_TABLET | Freq: Every day | ORAL | 3 refills | Status: DC

## 2022-10-06 NOTE — Progress Notes (Addendum)
  Subjective:  Patient ID: [name], male    DOB: 31-May-1933,  MRN: [id number]  Chief Complaint  Patient presents with   Nail Problem    Diabetic Foot Care-nail trim     89 y.o. male presents with the above complaint. History confirmed with patient. Patient presenting with pain related to dystrophic thickened elongated nails. Patient is unable to trim own nails related to nail dystrophy and/or mobility issues. Patient does  have a history of T2DM.  Patient does have a history of multiple amputations on the right foot.  Most recently had the third toe amputated by another doctor.  He does have a wound on the right foot at the amputation site that is being managed by wound care center.  Objective:  Physical Exam: warm, good capillary refill nail exam onychomycosis of the toenails, onycholysis, and dystrophic nails protective sensation absent, diminished PT and DP pulses nonpalpable Left Foot:  Pain with palpation of nails due to elongation and dystrophic growth.  Right Foot: Pain with palpation of nails due to elongation and dystrophic growth.  Prior to first through third toe amputation Dressing over ulceration on right foot.Marland Kitchen  Upon removing the overlying dressing there is noted to be ulceration that is smaller in size with healthy granular tissue base no evidence of infection improved from prior  Assessment:   1. Pain due to onychomycosis of toenails of both feet   2. Right foot ulcer, with fat layer exposed (HCC)   3. PAD (peripheral artery disease) (HCC)   4. Type 2 diabetes mellitus with diabetic peripheral angiopathy without gangrene, without long-term current use of insulin (HCC)       Plan:  Patient was evaluated and treated and all questions answered.  # Ulceration of the right foot third toe amputation site -Patient is following with wound care for this ulceration continue per their plan I do recommend a postoperative shoe for offloading of the ulceration on the  right foot at this time.  Patient was dispensed 1 postoperative shoe and recommended to wear this until the wound heals.  #Onychomycosis with pain  -Nails palliatively debrided as below. -Educated on self-care  Procedure: Nail Debridement Rationale: Pain Type of Debridement: manual, sharp debridement. Instrumentation: Nail nipper, rotary burr. Number of Nails: 7  Return in about 3 months (around 01/06/2023) for [facility].         Corinna Gab, DPM [facility] / [facility]

## 2022-10-10 ENCOUNTER — Ambulatory Visit: Payer: PPO | Admitting: Student

## 2022-10-10 ENCOUNTER — Ambulatory Visit (INDEPENDENT_AMBULATORY_CARE_PROVIDER_SITE_OTHER): Payer: PPO | Admitting: Family Medicine

## 2022-10-10 ENCOUNTER — Ambulatory Visit: Admission: RE | Admit: 2022-10-10 | Payer: PPO | Source: Ambulatory Visit

## 2022-10-10 VITALS — BP 100/60 | HR 88 | Ht 71.0 in | Wt 173.4 lb

## 2022-10-10 DIAGNOSIS — E1142 Type 2 diabetes mellitus with diabetic polyneuropathy: Secondary | ICD-10-CM | POA: Diagnosis not present

## 2022-10-10 DIAGNOSIS — R9389 Abnormal findings on diagnostic imaging of other specified body structures: Secondary | ICD-10-CM

## 2022-10-10 LAB — POCT GLYCOSYLATED HEMOGLOBIN (HGB A1C): HbA1c, POC (controlled diabetic range): 6.8 % (ref 0.0–7.0)

## 2022-10-10 NOTE — Progress Notes (Signed)
  SUBJECTIVE:   CHIEF COMPLAINT / HPI:   Here for checkup  T2DM -Current medication regimen: Lantus 6 units daily, Jardiance 25 mg daily.  Reports not responding well to metformin in the past. -Last A1c: 6.7 in May 2024, A1c is 6.8 today  -Home CBGs: fasting CBG usually 110s, not checking postprandial -Denies polyuria, polydipsia, abdominal pain, chest pain, shortness of breath  Abnormal chest x-ray 7/5-he was being evaluated for cough/URI and his chest x-ray showed left lower lobe opacity concerning for aspiration versus infection.  Repeat chest x-ray was recommended in 6 to 8 weeks.  Today, he denies symptoms: His previous symptoms resolved on their own.  Denies recent fevers, chest pain, shortness of breath.  He is using his inhalers as prescribed.  Has not had to use albuterol rescue recently.  Smoking hx - started smoking around 89yrs old, quit smoking around age 73.  Denies hemoptysis, chest pain, shortness of breath, unintentional weight loss. Endorses chronic cough occasionally productive of yellowish sputum  States he would prefer to get another x ray instead of a ct scan given his age and the associated costs   PERTINENT  PMH / PSH:   Past Medical History:  Diagnosis Date   ANEMIA, IRON DEF, NOS 01/15/2007   Qualifier: Diagnosis of  By: Constance Goltz MD, Matthew     Arthritis    Asthma    Cataract extraction status of left eye 2016   Cataract extraction status of right eye 2016   CHOLECYSTECTOMY, LAPAROSCOPIC, HX OF 04/12/2007   Qualifier: Diagnosis of  By: Burnadette Pop  MD, Trisha Mangle     COPD (chronic obstructive pulmonary disease) (HCC)    Critical lower limb ischemia (HCC) 10/02/2018   Diabetes mellitus without complication (HCC)    Gout 2007   Hypertension    Neuromuscular disorder (HCC)    Neuropathy   Neuropathy    bilateral feet   Pneumonia    Toe ulcer (HCC) 09/20/2018    OBJECTIVE:  BP 100/60   Pulse 88   Ht 5\' 11"  (1.803 m)   Wt 173 lb 6 oz (78.6 kg)   SpO2  95%   BMI 24.18 kg/m   General: NAD, pleasant, able to participate in exam Cardiac: RRR, no murmurs auscultated Respiratory: CTAB, normal WOB Abdomen: soft, non-tender, non-distended, normoactive bowel sounds Extremities: warm and well perfused, no edema or cyanosis Skin: warm and dry, no rashes noted Neuro: alert, no obvious focal deficits, speech normal Psych: Normal affect and mood  ASSESSMENT/PLAN:   1. Type 2 diabetes mellitus with peripheral neuropathy (HCC) Well-controlled, A1c stable from prior, continue current regimen.  Follow-up in 3 months, consider decreasing or coming off of insulin at that time if A1c is stable. - HgB A1c  2. Abnormal chest x-ray Chest x-ray 7/5 with left lower lobe opacity concerning for aspiration versus infection.  Reassuringly, his symptoms from that visit have now resolved on their own.  Currently asymptomatic.  However, he does have a significant smoking history.  Discussed options with him and upon shared decision making decided to obtain repeat chest x-ray rather than CT scan at this time.  However, if chest x-ray shows worsening opacity he may be open to a CT scan at that time. - DG Chest 2 View; Future   No orders of the defined types were placed in this encounter.  Return in about 3 months (around 01/10/2023) for A1c.  Vonna Drafts, MD [facility]

## 2022-10-10 NOTE — Patient Instructions (Addendum)
Please go to [address] to get an x ray of your chest - I will let you know if anything is abnormal and if we need to get further imaging. Please let us know if you start coughing up blood, have trouble breathing, chest pain, or fevers in the meantime.  Your A1c is 6.8! Great work - keep doing what you're doing and follow up in about 3 months.

## 2022-10-13 ENCOUNTER — Encounter (HOSPITAL_BASED_OUTPATIENT_CLINIC_OR_DEPARTMENT_OTHER): Payer: PPO | Admitting: Internal Medicine

## 2022-10-21 ENCOUNTER — Telehealth: Payer: Self-pay | Admitting: Family Medicine

## 2022-10-21 DIAGNOSIS — R9389 Abnormal findings on diagnostic imaging of other specified body structures: Secondary | ICD-10-CM

## 2022-10-21 NOTE — Telephone Encounter (Signed)
Called patient to discuss.  Reassured that the LLL opacity is improving on x-ray.  Repeat recommended in 6 weeks.  Advised patient to note on his calendar to come in the week of September 16 for repeat x-ray.  Placed future order.  Patient expresses understanding.  No further questions.  Vonna Drafts, MD

## 2022-10-27 ENCOUNTER — Ambulatory Visit: Payer: PPO

## 2022-10-27 ENCOUNTER — Encounter (HOSPITAL_BASED_OUTPATIENT_CLINIC_OR_DEPARTMENT_OTHER): Payer: PPO | Attending: Internal Medicine | Admitting: Internal Medicine

## 2022-10-27 DIAGNOSIS — E1122 Type 2 diabetes mellitus with diabetic chronic kidney disease: Secondary | ICD-10-CM | POA: Diagnosis not present

## 2022-10-27 DIAGNOSIS — E1151 Type 2 diabetes mellitus with diabetic peripheral angiopathy without gangrene: Secondary | ICD-10-CM | POA: Insufficient documentation

## 2022-10-27 DIAGNOSIS — L97512 Non-pressure chronic ulcer of other part of right foot with fat layer exposed: Secondary | ICD-10-CM | POA: Diagnosis not present

## 2022-10-27 DIAGNOSIS — Z794 Long term (current) use of insulin: Secondary | ICD-10-CM | POA: Insufficient documentation

## 2022-10-27 DIAGNOSIS — N183 Chronic kidney disease, stage 3 unspecified: Secondary | ICD-10-CM | POA: Diagnosis not present

## 2022-10-27 DIAGNOSIS — J4489 Other specified chronic obstructive pulmonary disease: Secondary | ICD-10-CM | POA: Diagnosis not present

## 2022-10-27 DIAGNOSIS — Z89421 Acquired absence of other right toe(s): Secondary | ICD-10-CM | POA: Diagnosis not present

## 2022-10-27 DIAGNOSIS — M1990 Unspecified osteoarthritis, unspecified site: Secondary | ICD-10-CM | POA: Insufficient documentation

## 2022-10-27 DIAGNOSIS — M109 Gout, unspecified: Secondary | ICD-10-CM | POA: Diagnosis not present

## 2022-10-27 DIAGNOSIS — Z9049 Acquired absence of other specified parts of digestive tract: Secondary | ICD-10-CM | POA: Diagnosis not present

## 2022-10-27 DIAGNOSIS — I70235 Atherosclerosis of native arteries of right leg with ulceration of other part of foot: Secondary | ICD-10-CM | POA: Insufficient documentation

## 2022-10-27 DIAGNOSIS — E11621 Type 2 diabetes mellitus with foot ulcer: Secondary | ICD-10-CM | POA: Insufficient documentation

## 2022-10-28 NOTE — Progress Notes (Signed)
MUSTO, Xander ([id number]) [id number] Page 1 of 9 Visit Report for 10/27/2022 Arrival Information Details Patient Name: Date of Service: [name] 10/27/2022 1:15 PM Medical Record Number: [id number] Patient Account Number: [id number] Date of Birth/Sex: Treating RN: [birth year] (89 y.o. [name] Primary Care Joniece Smotherman: [name] Other Clinician: Referring Chase Arnall: Treating Annastyn Silvey/Extender: Caffie Pinto in Treatment: 16 Visit Information History Since Last Visit Added or deleted any medications: No Patient Arrived: Ambulatory Any [name] allergies or adverse reactions: No Arrival Time: 13:39 Had a fall or experienced change in No Accompanied By: family activities of daily living that may affect Transfer Assistance: None risk of falls: Patient Identification Verified: Yes Signs or symptoms of abuse/neglect since last visito No Secondary Verification Process Completed: Yes Hospitalized since last visit: No Patient Requires Transmission-Based Precautions: No Implantable device outside of the clinic excluding No Patient Has Alerts: Yes cellular tissue based products placed in the center Patient Alerts: Patient on Blood Thinner since last visit: Has Dressing in Place as Prescribed: Yes Pain Present Now: No Electronic Signature(s) Signed: 10/27/2022 5:48:32 PM By: Shawn Stall RN, BSN Entered By: Shawn Stall on 10/27/2022 10:39:21 -------------------------------------------------------------------------------- Clinic Level of Care Assessment Details Patient Name: Date of Service: [name] 10/27/2022 1:15 PM Medical Record Number: [id number] Patient Account Number: [id number] Date of Birth/Sex: Treating RN: May 16, 1933 (89 y.o. [name] Primary Care Caidon Foti: [name] Other Clinician: Referring Acxel Dingee: Treating Kalena Mander/Extender: Caffie Pinto in  Treatment: 16 Clinic Level of Care Assessment Items TOOL 4 Quantity Score X- 1 0 Use when only an EandM is performed on FOLLOW-UP visit ASSESSMENTS - Nursing Assessment / Reassessment X- 1 10 Reassessment of Co-morbidities (includes updates in patient status) X- 1 5 Reassessment of Adherence to Treatment Plan ASSESSMENTS - Wound and Skin A ssessment / Reassessment X - Simple Wound Assessment / Reassessment - one wound 1 5 []  - 0 Complex Wound Assessment / Reassessment - multiple wounds X- 1 10 Dermatologic / Skin Assessment (not related to wound area) ASSESSMENTS - Focused Assessment X- 1 5 Circumferential Edema Measurements - multi extremities []  - 0 Nutritional Assessment / Counseling / Intervention [name], [name] ([id number]) [id number] Page 2 of 9 []  - 0 Lower Extremity Assessment (monofilament, tuning fork, pulses) []  - 0 Peripheral Arterial Disease Assessment (using hand held doppler) ASSESSMENTS - Ostomy and/or Continence Assessment and Care []  - 0 Incontinence Assessment and Management []  - 0 Ostomy Care Assessment and Management (repouching, etc.) PROCESS - Coordination of Care X - Simple Patient / Family Education for ongoing care 1 15 []  - 0 Complex (extensive) Patient / Family Education for ongoing care X- 1 10 Staff obtains Chiropractor, Records, T Results / Process Orders est []  - 0 Staff telephones HHA, Nursing Homes / Clarify orders / etc []  - 0 Routine Transfer to another Facility (non-emergent condition) []  - 0 Routine Hospital Admission (non-emergent condition) []  - 0 [name] Admissions / Manufacturing engineer / Ordering NPWT Apligraf, etc. , []  - 0 Emergency Hospital Admission (emergent condition) X- 1 10 Simple Discharge Coordination []  - 0 Complex (extensive) Discharge Coordination PROCESS - Special Needs []  - 0 Pediatric / Minor Patient Management []  - 0 Isolation Patient Management []  - 0 Hearing / Language /  Visual special needs []  - 0 Assessment of Community assistance (transportation, D/C planning, etc.) []  - 0 Additional assistance / Altered mentation []  - 0 Support Surface(s) Assessment (bed, cushion, seat, etc.) INTERVENTIONS - Wound Cleansing /  Measurement X - Simple Wound Cleansing - one wound 1 5 []  - 0 Complex Wound Cleansing - multiple wounds X- 1 5 Wound Imaging (photographs - any number of wounds) []  - 0 Wound Tracing (instead of photographs) X- 1 5 Simple Wound Measurement - one wound []  - 0 Complex Wound Measurement - multiple wounds INTERVENTIONS - Wound Dressings X - Small Wound Dressing one or multiple wounds 1 10 []  - 0 Medium Wound Dressing one or multiple wounds []  - 0 Large Wound Dressing one or multiple wounds X- 1 5 Application of Medications - topical []  - 0 Application of Medications - injection INTERVENTIONS - Miscellaneous []  - 0 External ear exam []  - 0 Specimen Collection (cultures, biopsies, blood, body fluids, etc.) []  - 0 Specimen(s) / Culture(s) sent or taken to Lab for analysis []  - 0 Patient Transfer (multiple staff / Nurse, adult / Similar devices) []  - 0 Simple Staple / Suture removal (25 or less) []  - 0 Complex Staple / Suture removal (26 or more) []  - 0 Hypo / Hyperglycemic Management (close monitor of Blood Glucose) [name], [name] ([id number]) [id number] Page 3 of 9 []  - 0 Ankle / Brachial Index (ABI) - do not check if billed separately X- 1 5 Vital Signs Has the patient been seen at the hospital within the last three years: Yes Total Score: 105 Level Of Care: [name]/Established - Level 3 Electronic Signature(s) Signed: 10/27/2022 5:48:32 PM By: Shawn Stall RN, BSN Entered By: Shawn Stall on 10/27/2022 11:14:07 -------------------------------------------------------------------------------- Encounter Discharge Information Details Patient Name: Date of Service: [name] 10/27/2022  1:15 PM Medical Record Number: [id number] Patient Account Number: [id number] Date of Birth/Sex: Treating RN: May 09, 1933 (89 y.o. [name] Primary Care Johnathyn Viscomi: [name] Other Clinician: Referring Anselma Herbel: Treating Tatsuya Okray/Extender: Caffie Pinto in Treatment: 16 Encounter Discharge Information Items Discharge Condition: Stable Ambulatory Status: Ambulatory Discharge Destination: Home Transportation: Private Auto Accompanied By: family Schedule Follow-up Appointment: Yes Clinical Summary of Care: Electronic Signature(s) Signed: 10/27/2022 5:48:32 PM By: Shawn Stall RN, BSN Entered By: Shawn Stall on 10/27/2022 11:14:34 -------------------------------------------------------------------------------- Lower Extremity Assessment Details Patient Name: Date of Service: [name] 10/27/2022 1:15 PM Medical Record Number: [id number] Patient Account Number: [id number] Date of Birth/Sex: Treating RN: 15-Jan-1934 (89 y.o. [name] Primary Care Jayliani Wanner: [name] Other Clinician: Referring Lareen Mullings: Treating Marquelle Musgrave/Extender: Caffie Pinto in Treatment: 16 Edema Assessment Assessed: [Left: No] Franne Forts: Yes] Edema: [Left: N] [Right: o] Calf Left: Right: Point of Measurement: From Medial Instep 33 cm Ankle Left: Right: Point of Measurement: From Medial Instep 22 cm Vascular Assessment [name], [name] ([id number]) [Right:[id number] Page 4 of 9] Pulses: Dorsalis Pedis Palpable: [Right:Yes] Extremity colors, hair growth, and conditions: Extremity Color: [Right:Normal] Hair Growth on Extremity: [Right:No] Temperature of Extremity: [Right:Warm] Capillary Refill: [Right:< 3 seconds] Dependent Rubor: [Right:No] Blanched when Elevated: [Right:No No] Toe Nail Assessment Left: Right: Thick: No Discolored: No Deformed: No Improper Length and Hygiene:  No Electronic Signature(s) Signed: 10/27/2022 5:48:32 PM By: Shawn Stall RN, BSN Entered By: Shawn Stall on 10/27/2022 10:40:41 -------------------------------------------------------------------------------- Multi Wound Chart Details Patient Name: Date of Service: [name] 10/27/2022 1:15 PM Medical Record Number: [id number] Patient Account Number: [id number] Date of Birth/Sex: Treating RN: [birth year] (89 y.o. M) Primary Care Keldric Poyer: [name] Other Clinician: Referring Jaylise Peek: Treating Maysen Sudol/Extender: Caffie Pinto in Treatment: 16 Vital Signs Height(in): Capillary Blood Glucose(mg/dl): 416 Weight(lbs): Pulse(bpm): 94 Body Mass Index(BMI): Blood Pressure(mmHg):  148/83 Temperature(F): 97.9 Respiratory Rate(breaths/min): 20 [3:Photos:] [N/A:N/A] Right Amputation Site - Toe N/A N/A Wound Location: Gradually Appeared N/A N/A Wounding Event: Diabetic Wound/Ulcer of the Lower N/A N/A Primary Etiology: Extremity Cataracts, Anemia, Asthma, Chronic N/A N/A Comorbid History: Obstructive Pulmonary Disease (COPD), Peripheral Arterial Disease, Type II Diabetes, Gout, Osteoarthritis, Neuropathy 04/07/2022 N/A N/A Date Acquired: 16 N/A N/A Weeks of Treatment: Open N/A N/A Wound Status: No N/A N/A Wound Recurrence: 1.3x0.3x0.2 N/A N/A Measurements L x W x D (cm) 0.306 N/A N/A A (cm) : rea 0.061 N/A N/A Volume (cm) : 90.40% N/A N/A % Reduction in Area: 93.60% N/A N/A % Reduction in VolumeForney [name], [name] ([id number]) [id number] Page 5 of 9 Grade 1 N/A N/A Classification: Medium N/A N/A Exudate A mount: Serosanguineous N/A N/A Exudate Type: red, brown N/A N/A Exudate Color: Epibole N/A N/A Wound Margin: Large (67-100%) N/A N/A Granulation A mount: Red, Pink N/A N/A Granulation Quality: Small (1-33%) N/A N/A Necrotic A mount: Fat Layer (Subcutaneous Tissue): Yes N/A N/A Exposed  Structures: Fascia: No Tendon: No Muscle: No Joint: No Bone: No Medium (34-66%) N/A N/A Epithelialization: Excoriation: No N/A N/A Periwound Skin Texture: Induration: No Callus: No Crepitus: No Rash: No Scarring: No Maceration: No N/A N/A Periwound Skin Moisture: Dry/Scaly: No Atrophie Blanche: No N/A N/A Periwound Skin Color: Cyanosis: No Ecchymosis: No Erythema: No Hemosiderin Staining: No Mottled: No Pallor: No Rubor: No No Abnormality N/A N/A Temperature: Treatment Notes Wound #3 (Amputation Site - Toe) Wound Laterality: Right Cleanser Vashe 5.8 (oz) Discharge Instruction: Cleanse the wound with Vashe prior to applying a clean dressing using gauze sponges, not tissue or cotton balls. Peri-Wound Care Topical Blastex Primary Dressing aquacel Ag Discharge Instruction: apply over the blastx to wound bed. Secondary Dressing Woven Gauze Sponge, Non-Sterile 4x4 in Discharge Instruction: Apply over primary dressing as directed. Secured With Paper Tape, 2x10 (in/yd) Discharge Instruction: Secure dressing with tape as directed. tubigrip size D Discharge Instruction: double layer from toes to midfoot. Compression Wrap Compression Stockings Add-Ons Electronic Signature(s) Signed: 10/31/2022 2:34:42 PM By: [name] DO Entered By: [name] on 10/31/2022 11:28:30 [name], [name] ([id number]) [id number] Page 6 of 9 -------------------------------------------------------------------------------- Multi-Disciplinary Care Plan Details Patient Name: Date of Service: [name] 10/27/2022 1:15 PM Medical Record Number: [id number] Patient Account Number: [id number] Date of Birth/Sex: Treating RN: Jul 19, 1933 (89 y.o. [name] Primary Care Kelda Azad: [name] Other Clinician: Referring Almina Schul: Treating Lynna Zamorano/Extender: Caffie Pinto in Treatment: 16 Active  Inactive Wound/Skin Impairment Nursing Diagnoses: Impaired tissue integrity Knowledge deficit related to ulceration/compromised skin integrity Goals: Patient will have a decrease in wound volume by X% from date: (specify in notes) Date Initiated: 07/04/2022 Target Resolution Date: 12/02/2022 Goal Status: Active Patient/caregiver will verbalize understanding of skin care regimen Date Initiated: 07/04/2022 Target Resolution Date: 12/02/2022 Goal Status: Active Ulcer/skin breakdown will have a volume reduction of 30% by week 4 Date Initiated: 07/04/2022 Date Inactivated: 10/27/2022 Target Resolution Date: 11/05/2022 Goal Status: Met Ulcer/skin breakdown will have a volume reduction of 50% by week 8 Date Initiated: 07/04/2022 Date Inactivated: 10/27/2022 Target Resolution Date: 11/05/2022 Goal Status: Met Interventions: Assess patient/caregiver ability to obtain necessary supplies Assess patient/caregiver ability to perform ulcer/skin care regimen upon admission and as needed Assess ulceration(s) every visit Notes: Electronic Signature(s) Signed: 10/27/2022 5:48:32 PM By: Shawn Stall RN, BSN Entered By: Shawn Stall on 10/27/2022 10:44:58 -------------------------------------------------------------------------------- Pain Assessment Details Patient Name: Date of Service: [name] 10/27/2022 1:15 PM Medical Record  Number: [id number] Patient Account Number: [id number] Date of Birth/Sex: Treating RN: [birth year] (89 y.o. [name] Primary Care Luismanuel Corman: [name] Other Clinician: Referring Lachina Salsberry: Treating Sundi Slevin/Extender: Caffie Pinto in Treatment: 16 Active Problems Location of Pain Severity and Description of Pain Patient Has Paino No Site Locations [name], [name] ([id number]) [id number] Page 7 of 9 Pain Management and Medication Current Pain Management: Electronic Signature(s) Signed:  10/27/2022 5:48:32 PM By: Shawn Stall RN, BSN Entered By: Shawn Stall on 10/27/2022 10:40:19 -------------------------------------------------------------------------------- Patient/Caregiver Education Details Patient Name: Date of Service: [name] 8/22/2024andnbsp1:15 PM Medical Record Number: [id number] Patient Account Number: [id number] Date of Birth/Gender: Treating RN: 04-23-1933 (89 y.o. [name] Primary Care Physician: [name] Other Clinician: Referring Physician: Treating Physician/Extender: Caffie Pinto in Treatment: 16 Education Assessment Education Provided To: Patient Education Topics Provided Wound/Skin Impairment: Handouts: Caring for Your Ulcer Methods: Explain/Verbal Responses: Reinforcements needed Electronic Signature(s) Signed: 10/27/2022 5:48:32 PM By: Shawn Stall RN, BSN Entered By: Shawn Stall on 10/27/2022 10:45:28 -------------------------------------------------------------------------------- Wound Assessment Details Patient Name: Date of Service: [name] 10/27/2022 1:15 PM Medical Record Number: [id number] Patient Account Number: [id number] Date of Birth/Sex: Treating RN: Jul 28, 1933 (89 y.o. [name] Primary Care Lashuna Tamashiro: [name] Other Clinician: Ellery [name] ([id number]) [id number] Page 8 of 9 Referring Mattia Liford: Treating Braelyn Bordonaro/Extender: Caffie Pinto in Treatment: 16 Wound Status Wound Number: 3 Primary Diabetic Wound/Ulcer of the Lower Extremity Etiology: Wound Location: Right Amputation Site - Toe Wound Open Wounding Event: Gradually Appeared Status: Date Acquired: 04/07/2022 Comorbid Cataracts, Anemia, Asthma, Chronic Obstructive Pulmonary Weeks Of Treatment: 16 History: Disease (COPD), Peripheral Arterial Disease, Type II Diabetes, Clustered Wound: No Gout, Osteoarthritis,  Neuropathy Photos Wound Measurements Length: (cm) 1.3 Width: (cm) 0.3 Depth: (cm) 0.2 Area: (cm) 0.306 Volume: (cm) 0.061 % Reduction in Area: 90.4% % Reduction in Volume: 93.6% Epithelialization: Medium (34-66%) Tunneling: No Undermining: No Wound Description Classification: Grade 1 Wound Margin: Epibole Exudate Amount: Medium Exudate Type: Serosanguineous Exudate Color: red, brown Foul Odor After Cleansing: No Slough/Fibrino Yes Wound Bed Granulation Amount: Large (67-100%) Exposed Structure Granulation Quality: Red, Pink Fascia Exposed: No Necrotic Amount: Small (1-33%) Fat Layer (Subcutaneous Tissue) Exposed: Yes Necrotic Quality: Adherent Slough Tendon Exposed: No Muscle Exposed: No Joint Exposed: No Bone Exposed: No Periwound Skin Texture Texture Color No Abnormalities Noted: No No Abnormalities Noted: No Callus: No Atrophie Blanche: No Crepitus: No Cyanosis: No Excoriation: No Ecchymosis: No Induration: No Erythema: No Rash: No Hemosiderin Staining: No Scarring: No Mottled: No Pallor: No Moisture Rubor: No No Abnormalities Noted: No Dry / Scaly: No Temperature / Pain Maceration: No Temperature: No Abnormality Treatment Notes Wound #3 (Amputation Site - Toe) Wound Laterality: Right Cleanser Vashe 5.8 (oz) Discharge Instruction: Cleanse the wound with Vashe prior to applying a clean dressing using gauze sponges, not tissue or cotton balls. Peri-Wound Care Topical [name], [name] ([id number]) [id number] Page 9 of 9 Blastex Primary Dressing aquacel Ag Discharge Instruction: apply over the blastx to wound bed. Secondary Dressing Woven Gauze Sponge, Non-Sterile 4x4 in Discharge Instruction: Apply over primary dressing as directed. Secured With Paper Tape, 2x10 (in/yd) Discharge Instruction: Secure dressing with tape as directed. tubigrip size D Discharge Instruction: double layer from toes to  midfoot. Compression Wrap Compression Stockings Add-Ons Electronic Signature(s) Signed: 10/27/2022 5:48:32 PM By: Shawn Stall RN, BSN Entered By: Shawn Stall on 10/27/2022 10:45:45 -------------------------------------------------------------------------------- Vitals Details Patient Name: Date of Service: [name], [name] 10/27/2022  1:15 PM Medical Record Number: [id number] Patient Account Number: [id number] Date of Birth/Sex: Treating RN: [birth year] (89 y.o. [name] Primary Care Levia Waltermire: [name] Other Clinician: Referring Jaylynn Mcaleer: Treating Jaymar Loeber/Extender: Caffie Pinto in Treatment: 16 Vital Signs Time Taken: 13:39 Temperature (F): 97.9 Pulse (bpm): 94 Respiratory Rate (breaths/min): 20 Blood Pressure (mmHg): 148/83 Capillary Blood Glucose (mg/dl): 440 Reference Range: 80 - 120 mg / dl Electronic Signature(s) Signed: 10/27/2022 5:48:32 PM By: Shawn Stall RN, BSN Entered By: Shawn Stall on 10/27/2022 10:40:14

## 2022-10-31 NOTE — Progress Notes (Addendum)
BURRAGE, Cadin ([id number]) [id number] Page 1 of 8 Visit Report for 10/27/2022 Chief Complaint Document Details Patient Name: Date of Service: [name] [name] 10/27/2022 1:15 PM Medical Record Number: [id number] Patient Account Number: [id number] Date of Birth/Sex: Treating RN: 1933-04-09 (89 y.o. M) Primary Care Provider: [name] Other Clinician: Referring Provider: Treating Provider/Extender: Caffie Pinto in Treatment: 16 Information Obtained from: Patient Chief Complaint 07/04/2022; right foot wound Electronic Signature(s) Signed: 10/31/2022 2:34:42 PM By: [name] DO Entered By: [name] on 10/31/2022 11:29:14 -------------------------------------------------------------------------------- HPI Details Patient Name: Date of Service: [name] [name] 10/27/2022 1:15 PM Medical Record Number: [id number] Patient Account Number: [id number] Date of Birth/Sex: Treating RN: May 30, 1933 (89 y.o. M) Primary Care Provider: [name] Other Clinician: Referring Provider: Treating Provider/Extender: Caffie Pinto in Treatment: 16 History of Present Illness HPI Description: 07/04/2022 Mr. Dustn Gardocki is an 89 year old male with a past medical history of uncontrolled, insulin dependent type 2 diabetes, CKD stage III, and COPD that presents to the clinic for a right foot wound. He has an extensive history of peripheral arterial disease to the right leg. He has had a right superficial femoral to posterior tibial artery bypass by Dr. Elonda Husky in 2020. He had a right common femoral to plantar posterior tibial artery bypass on 02/03/2022 by Dr. Lenell Antu and at that time he also had a right toe amputation. He states this wound site had healed completely. Prior to this he had amputations to the first and second digit to the right foot. He presents today with an open wound to the  previous right toe amputation site for the past 2 months. He has been following with vein and vascular for this issue. Most recent ABIs were on 06/22/2022 that showed absent waveforms in the right lower extremity. Currently his right lower extremity bypass is occluded and no plan for another anteriorgram and has he likely has no revascularization options. Currently he has been using Betadine to the wound bed. He denies signs of infection. 07/11/2022 patient arrives in clinic today using Hydrofera Blue on the toe amputation site. The patient is revascularized and is not felt to have any other options for revascularization. We are using Hydrofera Blue and gauze. 5/13; patient presents for follow-up. He has been using Hydrofera Blue to the toe amputation site. He has had no issues. Wound is smaller. He has been approved for [facility] and was agreeable to have this placed in office today. 5/20; patient presents for follow-up. We have been using Kerecis to the wound bed. There is been improvement in healing. Unfortunately patient did not change the outer dressing for a week. There is slight maceration to the periwound. Unclear why he did not do this. 5/30; patient presents for follow-up. We have been using Kerecis to the wound bed. More maceration to the periwound today. No signs of infection. 6/10; patient presents for follow-up. He has been using Hydrofera Blue to the wound bed. He has no issues or complaints today. Maceration has almost completely resolved. 6/18; patient presents for follow-up. He has been using blast X and collagen to the wound bed without issues. Wound appears smaller today. 6/28; patient presents for follow-up. He has been using blast X and collagen to the wound bed. The wound is smaller. 7/11; patient presents for follow-up. He has been using blast X and collagen to the wound bed. Wound is smaller. He has slough accumulation. 7/25; patient presents for follow-up. He has been using blast  X and collagen to the wound bed. Wound is stable. He denies signs of infection. ALVIRA, Reagen ([id number]) [id number] Page 2 of 8 8/26; patient presents for follow-up. He has been using blast X and collagen to the wound bed. Wound is slightly smaller. He has no issues or complaints. Electronic Signature(s) Signed: 10/31/2022 2:34:42 PM By: [name] DO Entered By: [name] on 10/31/2022 11:30:18 -------------------------------------------------------------------------------- Physical Exam Details Patient Name: Date of Service: [name] [name] 10/27/2022 1:15 PM Medical Record Number: [id number] Patient Account Number: [id number] Date of Birth/Sex: Treating RN: November 24, 1933 (89 y.o. M) Primary Care Provider: [name] Other Clinician: Referring Provider: Treating Provider/Extender: Caffie Pinto in Treatment: 16 Constitutional respirations regular, non-labored and within target range for patient.. Cardiovascular 2+ dorsalis pedis/posterior tibialis pulses. Psychiatric pleasant and cooperative. Notes Third toe amputation site with granulation tissue. No signs of surrounding infection. Electronic Signature(s) Signed: 10/31/2022 2:34:42 PM By: [name] DO Entered By: [name] on 10/31/2022 11:30:52 -------------------------------------------------------------------------------- Physician Orders Details Patient Name: Date of Service: [name] [name] 10/27/2022 1:15 PM Medical Record Number: [id number] Patient Account Number: [id number] Date of Birth/Sex: Treating RN: 28-May-1933 (89 y.o. [name] Primary Care Provider: [name] Other Clinician: Referring Provider: Treating Provider/Extender: Caffie Pinto in Treatment: 33 Verbal / Phone Orders: No Diagnosis Coding ICD-10 Coding Code Description [phone] Non-pressure chronic ulcer of other  part of right foot with fat layer exposed E11.621 Type 2 diabetes mellitus with foot ulcer I70.235 Atherosclerosis of native arteries of right leg with ulceration of other part of foot I73.9 Peripheral vascular disease, unspecified Follow-up Appointments ppointment in 1 week. - Dr. Mikey Bussing Thursday 245pm 11/03/2022 room 9 Return A Other: - [employer] Science blastx. Anesthetic (In clinic) Topical Lidocaine 5% applied to wound bed Shimamoto, Wendle ([id number]) [phone] Page 3 of 8 Bathing/ Shower/ Hygiene May shower with protection but do not get wound dressing(s) wet. Protect dressing(s) with water repellant cover (for example, large plastic bag) or a cast cover and may then take shower. Edema Control - Lymphedema / SCD / Other Elevate legs to the level of the heart or above for 30 minutes daily and/or when sitting for 3-4 times a day throughout the day. Avoid standing for long periods of time. Wound Treatment Wound #3 - Amputation Site - Toe Wound Laterality: Right Cleanser: Vashe 5.8 (oz) 1 x Per Day/30 Days Discharge Instructions: Cleanse the wound with Vashe prior to applying a clean dressing using gauze sponges, not tissue or cotton balls. Topical: Blastex (Generic) 1 x Per Day/30 Days Prim Dressing: aquacel Ag 1 x Per Day/30 Days ary Discharge Instructions: apply over the blastx to wound bed. Secondary Dressing: Woven Gauze Sponge, Non-Sterile 4x4 in (Generic) 1 x Per Day/30 Days Discharge Instructions: Apply over primary dressing as directed. Secured With: Paper Tape, 2x10 (in/yd) 1 x Per Day/30 Days Discharge Instructions: Secure dressing with tape as directed. Secured With: tubigrip size D 1 x Per Day/30 Days Discharge Instructions: double layer from toes to midfoot. Electronic Signature(s) Signed: 10/31/2022 2:34:42 PM By: [name] DO Previous Signature: 10/27/2022 5:48:32 PM Version By: Shawn Stall RN, BSN Previous Signature: 10/31/2022  1:35:31 PM Version By: [name] DO Entered By: [name] on 10/31/2022 11:31:34 -------------------------------------------------------------------------------- Problem List Details Patient Name: Date of Service: [name] [name] 10/27/2022 1:15 PM Medical Record Number: [id number] Patient Account Number: [id number] Date of Birth/Sex: Treating RN: May 29, 1933 (89 y.o. [name] Primary Care Provider: [name]  Other Clinician: Referring Provider: Treating Provider/Extender: Caffie Pinto in Treatment: 16 Active Problems ICD-10 Encounter Code Description Active Date MDM Diagnosis L97.512 Non-pressure chronic ulcer of other part of right foot with fat layer exposed 07/04/2022 No Yes E11.621 Type 2 diabetes mellitus with foot ulcer 07/04/2022 No Yes I70.235 Atherosclerosis of native arteries of right leg with ulceration of other part of 07/04/2022 No Yes foot I73.9 Peripheral vascular disease, unspecified 07/04/2022 No Yes Neubert, Yomar ([id number]) [id number] Page 4 of 8 Inactive Problems Resolved Problems Electronic Signature(s) Signed: 10/31/2022 2:34:42 PM By: [name] DO Previous Signature: 10/27/2022 5:48:32 PM Version By: Shawn Stall RN, BSN Previous Signature: 10/31/2022 1:35:31 PM Version By: [name] DO Entered By: [name] on 10/31/2022 11:28:24 -------------------------------------------------------------------------------- Progress Note Details Patient Name: Date of Service: [name] [name] 10/27/2022 1:15 PM Medical Record Number: [id number] Patient Account Number: [id number] Date of Birth/Sex: Treating RN: Sep 30, 1933 (89 y.o. M) Primary Care Provider: [name] Other Clinician: Referring Provider: Treating Provider/Extender: Caffie Pinto in Treatment: 16 Subjective Chief Complaint Information obtained from  Patient 07/04/2022; right foot wound History of Present Illness (HPI) 07/04/2022 Mr. Atharv Assis is an 89 year old male with a past medical history of uncontrolled, insulin dependent type 2 diabetes, CKD stage III, and COPD that presents to the clinic for a right foot wound. He has an extensive history of peripheral arterial disease to the right leg. He has had a right superficial femoral to posterior tibial artery bypass by Dr. Elonda Husky in 2020. He had a right common femoral to plantar posterior tibial artery bypass on 02/03/2022 by Dr. Lenell Antu and at that time he also had a right toe amputation. He states this wound site had healed completely. Prior to this he had amputations to the first and second digit to the right foot. He presents today with an open wound to the previous right toe amputation site for the past 2 months. He has been following with vein and vascular for this issue. Most recent ABIs were on 06/22/2022 that showed absent waveforms in the right lower extremity. Currently his right lower extremity bypass is occluded and no plan for another anteriorgram and has he likely has no revascularization options. Currently he has been using Betadine to the wound bed. He denies signs of infection. 07/11/2022 patient arrives in clinic today using Hydrofera Blue on the toe amputation site. The patient is revascularized and is not felt to have any other options for revascularization. We are using Hydrofera Blue and gauze. 5/13; patient presents for follow-up. He has been using Hydrofera Blue to the toe amputation site. He has had no issues. Wound is smaller. He has been approved for [facility] and was agreeable to have this placed in office today. 5/20; patient presents for follow-up. We have been using Kerecis to the wound bed. There is been improvement in healing. Unfortunately patient did not change the outer dressing for a week. There is slight maceration to the periwound. Unclear why he did not  do this. 5/30; patient presents for follow-up. We have been using Kerecis to the wound bed. More maceration to the periwound today. No signs of infection. 6/10; patient presents for follow-up. He has been using Hydrofera Blue to the wound bed. He has no issues or complaints today. Maceration has almost completely resolved. 6/18; patient presents for follow-up. He has been using blast X and collagen to the wound bed without issues. Wound appears smaller today. 6/28; patient presents for follow-up. He has  been using blast X and collagen to the wound bed. The wound is smaller. 7/11; patient presents for follow-up. He has been using blast X and collagen to the wound bed. Wound is smaller. He has slough accumulation. 7/25; patient presents for follow-up. He has been using blast X and collagen to the wound bed. Wound is stable. He denies signs of infection. 8/26; patient presents for follow-up. He has been using blast X and collagen to the wound bed. Wound is slightly smaller. He has no issues or complaints. Patient History Information obtained from Patient. Family History Cancer - Siblings, Diabetes - Siblings,Child, Heart Disease - Siblings, Hypertension - Father,Siblings, Lung Disease - Siblings, Stroke - Siblings, No family history of Hereditary Spherocytosis, Kidney Disease, Seizures, Thyroid Problems, Tuberculosis. Social History Former smoker - quit 10 yrs ago, Marital Status - Married, Alcohol Use - Never, Drug Use - No History, Caffeine Use - Daily - coffee. Medical History Eyes Patient has history of Cataracts - removed both eyes Denies history of Glaucoma, Optic Neuritis Ear/Nose/Mouth/Throat BILLINGSLEY, Tirso ([id number]) [id number] Page 5 of 8 Denies history of Chronic sinus problems/congestion, Middle ear problems Hematologic/Lymphatic Patient has history of Anemia Denies history of Hemophilia, Human Immunodeficiency Virus, Lymphedema, Sickle Cell  Disease Respiratory Patient has history of Asthma, Chronic Obstructive Pulmonary Disease (COPD) Denies history of Aspiration, Pneumothorax, Sleep Apnea, Tuberculosis Cardiovascular Patient has history of Peripheral Arterial Disease Endocrine Patient has history of Type II Diabetes - prediabetes Denies history of Type I Diabetes Genitourinary Denies history of End Stage Renal Disease Integumentary (Skin) Denies history of History of Burn Musculoskeletal Patient has history of Gout, Osteoarthritis Denies history of Rheumatoid Arthritis, Osteomyelitis Neurologic Patient has history of Neuropathy Oncologic Denies history of Received Chemotherapy, Received Radiation Psychiatric Denies history of Anorexia/bulimia, Confinement Anxiety Hospitalization/Surgery History - cholecystectomy. Medical A Surgical History Notes nd Ear/Nose/Mouth/Throat hard of hearing Objective Constitutional respirations regular, non-labored and within target range for patient.. Vitals Time Taken: 1:39 PM, Temperature: 97.9 F, Pulse: 94 bpm, Respiratory Rate: 20 breaths/min, Blood Pressure: 148/83 mmHg, Capillary Blood Glucose: 120 mg/dl. Cardiovascular 2+ dorsalis pedis/posterior tibialis pulses. Psychiatric pleasant and cooperative. General Notes: Third toe amputation site with granulation tissue. No signs of surrounding infection. Integumentary (Hair, Skin) Wound #3 status is Open. Original cause of wound was Gradually Appeared. The date acquired was: 04/07/2022. The wound has been in treatment 16 weeks. The wound is located on the Right Amputation Site - T The wound measures 1.3cm length x 0.3cm width x 0.2cm depth; 0.306cm^2 area and 0.061cm^3 volume. oe. There is Fat Layer (Subcutaneous Tissue) exposed. There is no tunneling or undermining noted. There is a medium amount of serosanguineous drainage noted. The wound margin is epibole. There is large (67-100%) red, pink granulation within the wound bed.  There is a small (1-33%) amount of necrotic tissue within the wound bed including Adherent Slough. The periwound skin appearance did not exhibit: Callus, Crepitus, Excoriation, Induration, Rash, Scarring, Dry/Scaly, Maceration, Atrophie Blanche, Cyanosis, Ecchymosis, Hemosiderin Staining, Mottled, Pallor, Rubor, Erythema. Periwound temperature was noted as No Abnormality. Assessment Active Problems ICD-10 Non-pressure chronic ulcer of other part of right foot with fat layer exposed Type 2 diabetes mellitus with foot ulcer Atherosclerosis of native arteries of right leg with ulceration of other part of foot Peripheral vascular disease, unspecified Patient's wound is slightly improved in size since last clinic visit. At this time I recommended switching the dressing to see if this will help further stimulate more granulation tissue. I recommended switching to Aquacel Ag  and can continue with blast X. I also recommended he use Tubigrip to keep the toes together as it appears that the separation (due to amputation) is causing more of a delay in healing. PITTSLEY, Tavari ([id number]) [id number] Page 6 of 8 Plan Follow-up Appointments: Return Appointment in 1 week. - Dr. Mikey Bussing Thursday 245pm 11/03/2022 room 9 Other: - [employer] blastx. Anesthetic: (In clinic) Topical Lidocaine 5% applied to wound bed Bathing/ Shower/ Hygiene: May shower with protection but do not get wound dressing(s) wet. Protect dressing(s) with water repellant cover (for example, large plastic bag) or a cast cover and may then take shower. Edema Control - Lymphedema / SCD / Other: Elevate legs to the level of the heart or above for 30 minutes daily and/or when sitting for 3-4 times a day throughout the day. Avoid standing for long periods of time. WOUND #3: - Amputation Site - T oe Wound Laterality: Right Cleanser: Vashe 5.8 (oz) 1 x Per Day/30 Days Discharge Instructions: Cleanse the  wound with Vashe prior to applying a clean dressing using gauze sponges, not tissue or cotton balls. Topical: Blastex (Generic) 1 x Per Day/30 Days Prim Dressing: aquacel Ag 1 x Per Day/30 Days ary Discharge Instructions: apply over the blastx to wound bed. Secondary Dressing: Woven Gauze Sponge, Non-Sterile 4x4 in (Generic) 1 x Per Day/30 Days Discharge Instructions: Apply over primary dressing as directed. Secured With: Paper T ape, 2x10 (in/yd) 1 x Per Day/30 Days Discharge Instructions: Secure dressing with tape as directed. Secured With: tubigrip size D 1 x Per Day/30 Days Discharge Instructions: double layer from toes to midfoot. 1. Aquacel Ag with blast X 2. Tubigrip 3. Follow-up in 1 week Electronic Signature(s) Signed: 10/31/2022 2:34:42 PM By: [name] DO Entered By: [name] on 10/31/2022 11:33:49 -------------------------------------------------------------------------------- HxROS Details Patient Name: Date of Service: [name] [name] 10/27/2022 1:15 PM Medical Record Number: [id number] Patient Account Number: [id number] Date of Birth/Sex: Treating RN: 12/14/1933 (89 y.o. M) Primary Care Provider: [name] Other Clinician: Referring Provider: Treating Provider/Extender: Caffie Pinto in Treatment: 16 Information Obtained From Patient Eyes Medical History: Positive for: Cataracts - removed both eyes Negative for: Glaucoma; Optic Neuritis Ear/Nose/Mouth/Throat Medical History: Negative for: Chronic sinus problems/congestion; Middle ear problems Past Medical History Notes: hard of hearing Hematologic/Lymphatic Medical History: Positive for: Anemia Negative for: Hemophilia; Human Immunodeficiency Virus; Lymphedema; Sickle Cell Disease Respiratory Medical HistoryHUMBERT, LOWE ([id number]) [id number] Page 7 of 8 Positive for: Asthma; Chronic Obstructive Pulmonary Disease  (COPD) Negative for: Aspiration; Pneumothorax; Sleep Apnea; Tuberculosis Cardiovascular Medical History: Positive for: Peripheral Arterial Disease Endocrine Medical History: Positive for: Type II Diabetes - prediabetes Negative for: Type I Diabetes Time with diabetes: 3 yrs ago Treated with: Diet Blood sugar tested every day: No Genitourinary Medical History: Negative for: End Stage Renal Disease Integumentary (Skin) Medical History: Negative for: History of Burn Musculoskeletal Medical History: Positive for: Gout; Osteoarthritis Negative for: Rheumatoid Arthritis; Osteomyelitis Neurologic Medical History: Positive for: Neuropathy Oncologic Medical History: Negative for: Received Chemotherapy; Received Radiation Psychiatric Medical History: Negative for: Anorexia/bulimia; Confinement Anxiety HBO Extended History Items Eyes: Cataracts Immunizations Pneumococcal Vaccine: Received Pneumococcal Vaccination: Yes Received Pneumococcal Vaccination On or After 60th Birthday: Yes Implantable Devices None Hospitalization / Surgery History Type of Hospitalization/Surgery cholecystectomy Family and Social History Cancer: Yes - Siblings; Diabetes: Yes - Siblings,Child; Heart Disease: Yes - Siblings; Hereditary Spherocytosis: No; Hypertension: Yes - Father,Siblings; Kidney Disease: No; Lung Disease: Yes - Siblings; Seizures: No; Stroke: Yes - Siblings;  Thyroid Problems: No; Tuberculosis: No; Former smoker - quit 10 yrs ago; Marital Status - Married; Alcohol Use: Never; Drug Use: No History; Caffeine Use: Daily - coffee; Financial Concerns: No; Food, Clothing or Shelter Needs: No; Support System Lacking: No; Transportation Concerns: No Electronic Signature(s) Signed: 10/31/2022 2:34:42 PM By: [name] DO Entered By: [name] on 10/31/2022 11:30:30 Igoe, Nhia ([id number]) [id number] Page 8 of  8 -------------------------------------------------------------------------------- SuperBill Details Patient Name: Date of Service: [name] [name] 10/27/2022 Medical Record Number: [id number] Patient Account Number: [id number] Date of Birth/Sex: Treating RN: 25-Sep-1933 (89 y.o. [name] Primary Care Provider: [name] Other Clinician: Referring Provider: Treating Provider/Extender: Caffie Pinto in Treatment: 16 Diagnosis Coding ICD-10 Codes Code Description [phone] Non-pressure chronic ulcer of other part of right foot with fat layer exposed E11.621 Type 2 diabetes mellitus with foot ulcer I70.235 Atherosclerosis of native arteries of right leg with ulceration of other part of foot I73.9 Peripheral vascular disease, unspecified Facility Procedures : CPT4 Code: [id number] Description: 9213 - WOUND CARE VISIT-LEV 3 EST PT Modifier: Quantity: 1 Physician Procedures : CPT4 Code Description Modifier [id number] - WC PHYS LEVEL 3 - EST PT ICD-10 Diagnosis Description L97.512 Non-pressure chronic ulcer of other part of right foot with fat layer exposed E11.621 Type 2 diabetes mellitus with foot ulcer I70.235  Atherosclerosis of native arteries of right leg with ulceration of other part of foot I73.9 Peripheral vascular disease, unspecified Quantity: 1 Electronic Signature(s) Signed: 10/31/2022 2:34:42 PM By: [name] DO Previous Signature: 10/27/2022 5:48:32 PM Version By: Shawn Stall RN, BSN Previous Signature: 10/31/2022 1:35:31 PM Version By: [name] DO Entered By: [name] on 10/31/2022 11:34:02

## 2022-11-03 ENCOUNTER — Encounter (HOSPITAL_BASED_OUTPATIENT_CLINIC_OR_DEPARTMENT_OTHER): Payer: PPO | Admitting: Internal Medicine

## 2022-11-03 DIAGNOSIS — L97512 Non-pressure chronic ulcer of other part of right foot with fat layer exposed: Secondary | ICD-10-CM

## 2022-11-03 DIAGNOSIS — E11621 Type 2 diabetes mellitus with foot ulcer: Secondary | ICD-10-CM | POA: Diagnosis not present

## 2022-11-03 DIAGNOSIS — I70235 Atherosclerosis of native arteries of right leg with ulceration of other part of foot: Secondary | ICD-10-CM | POA: Diagnosis not present

## 2022-11-03 NOTE — Progress Notes (Addendum)
BLAZINA, Carlee ([id number]) [id number] Page 1 of 8 Visit Report for 11/03/2022 Chief Complaint Document Details Patient Name: Date of Service: [name] 11/03/2022 2:45 PM Medical Record Number: [id number] Patient Account Number: [id number] Date of Birth/Sex: Treating RN: [birth year] (89 y.o. M) Primary Care Provider: [name] Other Clinician: Referring Provider: Treating Provider/Extender: Caffie Pinto in Treatment: 17 Information Obtained from: Patient Chief Complaint 07/04/2022; right foot wound Electronic Signature(s) Signed: 11/03/2022 4:50:14 PM By: [name] DO Entered By: [name] on 11/03/2022 15:05:06 -------------------------------------------------------------------------------- HPI Details Patient Name: Date of Service: [name] 11/03/2022 2:45 PM Medical Record Number: [id number] Patient Account Number: [id number] Date of Birth/Sex: Treating RN: [birth year] (89 y.o. M) Primary Care Provider: [name] Other Clinician: Referring Provider: Treating Provider/Extender: Caffie Pinto in Treatment: 17 History of Present Illness HPI Description: 07/04/2022 [name] is an 89 year old male with a past medical history of uncontrolled, insulin dependent type 2 diabetes, CKD stage III, and COPD that presents to the clinic for a right foot wound. He has an extensive history of peripheral arterial disease to the right leg. He has had a right superficial femoral to posterior tibial artery bypass by Dr. Elonda Husky in 2020. He had a right common femoral to plantar posterior tibial artery bypass on 02/03/2022 by Dr. Lenell Antu and at that time he also had a right toe amputation. He states this wound site had healed completely. Prior to this he had amputations to the first and second digit to the right foot. He presents today with an open wound to the  previous right toe amputation site for the past 2 months. He has been following with vein and vascular for this issue. Most recent ABIs were on 06/22/2022 that showed absent waveforms in the right lower extremity. Currently his right lower extremity bypass is occluded and no plan for another anteriorgram and has he likely has no revascularization options. Currently he has been using Betadine to the wound bed. He denies signs of infection. 07/11/2022 patient arrives in clinic today using Hydrofera Blue on the toe amputation site. The patient is revascularized and is not felt to have any other options for revascularization. We are using Hydrofera Blue and gauze. 5/13; patient presents for follow-up. He has been using Hydrofera Blue to the toe amputation site. He has had no issues. Wound is smaller. He has been approved for [facility] and was agreeable to have this placed in office today. 5/20; patient presents for follow-up. We have been using Kerecis to the wound bed. There is been improvement in healing. Unfortunately patient did not change the outer dressing for a week. There is slight maceration to the periwound. Unclear why he did not do this. 5/30; patient presents for follow-up. We have been using Kerecis to the wound bed. More maceration to the periwound today. No signs of infection. 6/10; patient presents for follow-up. He has been using Hydrofera Blue to the wound bed. He has no issues or complaints today. Maceration has almost completely resolved. 6/18; patient presents for follow-up. He has been using blast X and collagen to the wound bed without issues. Wound appears smaller today. 6/28; patient presents for follow-up. He has been using blast X and collagen to the wound bed. The wound is smaller. 7/11; patient presents for follow-up. He has been using blast X and collagen to the wound bed. Wound is smaller. He has slough accumulation. 7/25; patient presents for follow-up. He has been using blast  X and collagen to the wound bed. Wound is stable. He denies signs of infection. [name], [name] ([id number]) [id number] Page 2 of 8 8/26; patient presents for follow-up. He has been using blast X and collagen to the wound bed. Wound is slightly smaller. He has no issues or complaints. 8/29; patient presents for follow-up. He has been using blast X and Aquacel Ag to the wound bed. He has been using a Velcro wrap at his toes to keep the toes closer together to help with wound healing. He has no issues or complaints today. Wound is smaller. Electronic Signature(s) Signed: 11/03/2022 4:50:14 PM By: [name] DO Entered By: [name] on 11/03/2022 15:18:51 -------------------------------------------------------------------------------- Physical Exam Details Patient Name: Date of Service: [name] 11/03/2022 2:45 PM Medical Record Number: [id number] Patient Account Number: [id number] Date of Birth/Sex: Treating RN: August 17, 1933 (89 y.o. M) Primary Care Provider: [name] Other Clinician: Referring Provider: Treating Provider/Extender: Caffie Pinto in Treatment: 17 Constitutional respirations regular, non-labored and within target range for patient.. Cardiovascular 2+ dorsalis pedis/posterior tibialis pulses. Psychiatric pleasant and cooperative. Notes Third toe amputation site with granulation tissue. No signs of surrounding infection. Electronic Signature(s) Signed: 11/03/2022 4:50:14 PM By: [name] DO Entered By: [name] on 11/03/2022 15:19:31 -------------------------------------------------------------------------------- Physician Orders Details Patient Name: Date of Service: [name] 11/03/2022 2:45 PM Medical Record Number: [id number] Patient Account Number: [id number] Date of Birth/Sex: Treating RN: [birth year] (89 y.o. [name] Primary Care Provider:  [name] Other Clinician: Referring Provider: Treating Provider/Extender: Caffie Pinto in Treatment: [phone] Verbal / Phone Orders: No Diagnosis Coding Follow-up Appointments ppointment in 1 week. - Dr. Mikey Bussing Thursday 1:15pm 11/17/22 room 9 Return A Other: - [employer] blastx. Anesthetic (In clinic) Topical Lidocaine 5% applied to wound bed Bathing/ Shower/ Hygiene May shower with protection but do not get wound dressing(s) wet. Protect dressing(s) with water repellant cover (for example, large plastic bag) or a cast cover and may then take shower. Edema Control - Lymphedema / SCD / Deontrae [name], [name] [id number] Page 3 of 8 Elevate legs to the level of the heart or above for 30 minutes daily and/or when sitting for 3-4 times a day throughout the day. Avoid standing for long periods of time. Wound Treatment Wound #3 - Amputation Site - Toe Wound Laterality: Right Cleanser: Vashe 5.8 (oz) 1 x Per Day/30 Days Discharge Instructions: Cleanse the wound with Vashe prior to applying a clean dressing using gauze sponges, not tissue or cotton balls. Topical: BlastX (Generic) 1 x Per Day/30 Days Prim Dressing: aquacel Ag 1 x Per Day/30 Days ary Discharge Instructions: apply over the blastx to wound bed. Secondary Dressing: Woven Gauze Sponge, Non-Sterile 4x4 in (Generic) 1 x Per Day/30 Days Discharge Instructions: Apply over primary dressing as directed. Secured With: Paper Tape, 2x10 (in/yd) 1 x Per Day/30 Days Discharge Instructions: Secure dressing with tape as directed. Secured With: tubigrip size D 1 x Per Day/30 Days Discharge Instructions: double layer from toes to midfoot. Electronic Signature(s) Signed: 11/03/2022 4:50:14 PM By: [name] DO Entered By: [name] on 11/03/2022 15:19:44 -------------------------------------------------------------------------------- Problem List  Details Patient Name: Date of Service: [name] 11/03/2022 2:45 PM Medical Record Number: [id number] Patient Account Number: [id number] Date of Birth/Sex: Treating RN: 1933-12-07 (89 y.o. M) Primary Care Provider: [name] Other Clinician: Referring Provider: Treating Provider/Extender: Caffie Pinto in Treatment: 03 Active Problems ICD-10 Encounter Code Description  Active Date MDM Diagnosis L97.512 Non-pressure chronic ulcer of other part of right foot with fat layer exposed 07/04/2022 No Yes E11.621 Type 2 diabetes mellitus with foot ulcer 07/04/2022 No Yes I70.235 Atherosclerosis of native arteries of right leg with ulceration of other part of 07/04/2022 No Yes foot I73.9 Peripheral vascular disease, unspecified 07/04/2022 No Yes Inactive Problems Resolved Problems Electronic Signature(s) Signed: 11/03/2022 4:50:14 PM By: Minus [name], [name] ([id number]) [id number] Page 4 of 8 Entered By: [name] on 11/03/2022 15:04:23 -------------------------------------------------------------------------------- Progress Note Details Patient Name: Date of Service: [name] 11/03/2022 2:45 PM Medical Record Number: [id number] Patient Account Number: [id number] Date of Birth/Sex: Treating RN: 1933-07-20 (89 y.o. M) Primary Care Provider: [name] Other Clinician: Referring Provider: Treating Provider/Extender: Caffie Pinto in Treatment: 17 Subjective Chief Complaint Information obtained from Patient 07/04/2022; right foot wound History of Present Illness (HPI) 07/04/2022 Mr. Mouhamad Azzarello is an 89 year old male with a past medical history of uncontrolled, insulin dependent type 2 diabetes, CKD stage III, and COPD that presents to the clinic for a right foot wound. He has an extensive history of peripheral arterial disease to the right leg. He  has had a right superficial femoral to posterior tibial artery bypass by Dr. Elonda Husky in 2020. He had a right common femoral to plantar posterior tibial artery bypass on 02/03/2022 by Dr. Lenell Antu and at that time he also had a right toe amputation. He states this wound site had healed completely. Prior to this he had amputations to the first and second digit to the right foot. He presents today with an open wound to the previous right toe amputation site for the past 2 months. He has been following with vein and vascular for this issue. Most recent ABIs were on 06/22/2022 that showed absent waveforms in the right lower extremity. Currently his right lower extremity bypass is occluded and no plan for another anteriorgram and has he likely has no revascularization options. Currently he has been using Betadine to the wound bed. He denies signs of infection. 07/11/2022 patient arrives in clinic today using Hydrofera Blue on the toe amputation site. The patient is revascularized and is not felt to have any other options for revascularization. We are using Hydrofera Blue and gauze. 5/13; patient presents for follow-up. He has been using Hydrofera Blue to the toe amputation site. He has had no issues. Wound is smaller. He has been approved for [facility] and was agreeable to have this placed in office today. 5/20; patient presents for follow-up. We have been using Kerecis to the wound bed. There is been improvement in healing. Unfortunately patient did not change the outer dressing for a week. There is slight maceration to the periwound. Unclear why he did not do this. 5/30; patient presents for follow-up. We have been using Kerecis to the wound bed. More maceration to the periwound today. No signs of infection. 6/10; patient presents for follow-up. He has been using Hydrofera Blue to the wound bed. He has no issues or complaints today. Maceration has almost completely resolved. 6/18; patient presents for  follow-up. He has been using blast X and collagen to the wound bed without issues. Wound appears smaller today. 6/28; patient presents for follow-up. He has been using blast X and collagen to the wound bed. The wound is smaller. 7/11; patient presents for follow-up. He has been using blast X and collagen to the wound bed. Wound is smaller. He has slough accumulation. 7/25; patient presents  for follow-up. He has been using blast X and collagen to the wound bed. Wound is stable. He denies signs of infection. 8/26; patient presents for follow-up. He has been using blast X and collagen to the wound bed. Wound is slightly smaller. He has no issues or complaints. 8/29; patient presents for follow-up. He has been using blast X and Aquacel Ag to the wound bed. He has been using a Velcro wrap at his toes to keep the toes closer together to help with wound healing. He has no issues or complaints today. Wound is smaller. Patient History Information obtained from Patient. Family History Cancer - Siblings, Diabetes - Siblings,Child, Heart Disease - Siblings, Hypertension - Father,Siblings, Lung Disease - Siblings, Stroke - Siblings, No family history of Hereditary Spherocytosis, Kidney Disease, Seizures, Thyroid Problems, Tuberculosis. Social History Former smoker - quit 10 yrs ago, Marital Status - Married, Alcohol Use - Never, Drug Use - No History, Caffeine Use - Daily - coffee. Medical History Eyes Patient has history of Cataracts - removed both eyes Denies history of Glaucoma, Optic Neuritis Ear/Nose/Mouth/Throat Denies history of Chronic sinus problems/congestion, Middle ear problems Hematologic/Lymphatic Patient has history of Anemia Denies history of Hemophilia, Human Immunodeficiency Virus, Lymphedema, Sickle Cell Disease Respiratory Patient has history of Asthma, Chronic Obstructive Pulmonary Disease (COPD) Denies history of Aspiration, Pneumothorax, Sleep Apnea,  Tuberculosis Cardiovascular [name], [name] ([id number]) [id number] Page 5 of 8 Patient has history of Peripheral Arterial Disease Endocrine Patient has history of Type II Diabetes - prediabetes Denies history of Type I Diabetes Genitourinary Denies history of End Stage Renal Disease Integumentary (Skin) Denies history of History of Burn Musculoskeletal Patient has history of Gout, Osteoarthritis Denies history of Rheumatoid Arthritis, Osteomyelitis Neurologic Patient has history of Neuropathy Oncologic Denies history of Received Chemotherapy, Received Radiation Psychiatric Denies history of Anorexia/bulimia, Confinement Anxiety Hospitalization/Surgery History - cholecystectomy. Medical A Surgical History Notes nd Ear/Nose/Mouth/Throat hard of hearing Objective Constitutional respirations regular, non-labored and within target range for patient.. Vitals Time Taken: 2:32 PM, Height: 71 in, Weight: 170 lbs, BMI: 23.7, Temperature: 97.7 F, Pulse: 79 bpm, Respiratory Rate: 18 breaths/min, Blood Pressure: 150/87 mmHg. Cardiovascular 2+ dorsalis pedis/posterior tibialis pulses. Psychiatric pleasant and cooperative. General Notes: Third toe amputation site with granulation tissue. No signs of surrounding infection. Integumentary (Hair, Skin) Wound #3 status is Open. Original cause of wound was Gradually Appeared. The date acquired was: 04/07/2022. The wound has been in treatment 17 weeks. The wound is located on the Right Amputation Site - T The wound measures 0.5cm length x 0.2cm width x 0.2cm depth; 0.079cm^2 area and 0.016cm^3 volume. oe. There is Fat Layer (Subcutaneous Tissue) exposed. There is no tunneling or undermining noted. There is a medium amount of serosanguineous drainage noted. The wound margin is epibole. There is large (67-100%) red, pink granulation within the wound bed. There is a small (1-33%) amount of necrotic tissue within  the wound bed including Adherent Slough. The periwound skin appearance did not exhibit: Callus, Crepitus, Excoriation, Induration, Rash, Scarring, Dry/Scaly, Maceration, Atrophie Blanche, Cyanosis, Ecchymosis, Hemosiderin Staining, Mottled, Pallor, Rubor, Erythema. Periwound temperature was noted as No Abnormality. Assessment Active Problems ICD-10 Non-pressure chronic ulcer of other part of right foot with fat layer exposed Type 2 diabetes mellitus with foot ulcer Atherosclerosis of native arteries of right leg with ulceration of other part of foot Peripheral vascular disease, unspecified Patient's wound has shown improvement in size in appearance since last clinic visit. I recommended continuing the course with blast X and Aquacel Ag. He  has a neoprene Velcro wrist band that he has been using on his toes to help keep the toes together to help with wound healing. I think this is working well. I recommended continuing this. Follow-up in 2 weeks. Plan Follow-up Appointments: Return Appointment in 1 week. - Dr. Mikey Bussing Thursday 1:15pm 11/17/22 room 9 [place], [name] ([id number]) [phone].pdf Page 6 of 8 Other: - [employer] blastx. Anesthetic: (In clinic) Topical Lidocaine 5% applied to wound bed Bathing/ Shower/ Hygiene: May shower with protection but do not get wound dressing(s) wet. Protect dressing(s) with water repellant cover (for example, large plastic bag) or a cast cover and may then take shower. Edema Control - Lymphedema / SCD / Other: Elevate legs to the level of the heart or above for 30 minutes daily and/or when sitting for 3-4 times a day throughout the day. Avoid standing for long periods of time. WOUND #3: - Amputation Site - T oe Wound Laterality: Right Cleanser: Vashe 5.8 (oz) 1 x Per Day/30 Days Discharge Instructions: Cleanse the wound with Vashe prior to applying a clean dressing using gauze sponges, not tissue or cotton balls. Topical:  BlastX (Generic) 1 x Per Day/30 Days Prim Dressing: aquacel Ag 1 x Per Day/30 Days ary Discharge Instructions: apply over the blastx to wound bed. Secondary Dressing: Woven Gauze Sponge, Non-Sterile 4x4 in (Generic) 1 x Per Day/30 Days Discharge Instructions: Apply over primary dressing as directed. Secured With: Paper T ape, 2x10 (in/yd) 1 x Per Day/30 Days Discharge Instructions: Secure dressing with tape as directed. Secured With: tubigrip size D 1 x Per Day/30 Days Discharge Instructions: double layer from toes to midfoot. 1. Aquacel Ag with blast X 2. Follow-up in 2 weeks Electronic Signature(s) Signed: 11/03/2022 4:50:14 PM By: [name] DO Entered By: [name] on 11/03/2022 15:20:49 -------------------------------------------------------------------------------- HxROS Details Patient Name: Date of Service: [name] 11/03/2022 2:45 PM Medical Record Number: [id number] Patient Account Number: [id number] Date of Birth/Sex: Treating RN: [birth year] (89 y.o. M) Primary Care Provider: [name] Other Clinician: Referring Provider: Treating Provider/Extender: Caffie Pinto in Treatment: 17 Information Obtained From Patient Eyes Medical History: Positive for: Cataracts - removed both eyes Negative for: Glaucoma; Optic Neuritis Ear/Nose/Mouth/Throat Medical History: Negative for: Chronic sinus problems/congestion; Middle ear problems Past Medical History Notes: hard of hearing Hematologic/Lymphatic Medical History: Positive for: Anemia Negative for: Hemophilia; Human Immunodeficiency Virus; Lymphedema; Sickle Cell Disease Respiratory Medical History: Positive for: Asthma; Chronic Obstructive Pulmonary Disease (COPD) Negative for: Aspiration; Pneumothorax; Sleep Apnea; Tuberculosis Cardiovascular Medical History: Positive for: Peripheral Arterial Disease [name], [name] ([id number])  [phone].pdf Page 7 of 8 Endocrine Medical History: Positive for: Type II Diabetes - prediabetes Negative for: Type I Diabetes Time with diabetes: 3 yrs ago Treated with: Diet Blood sugar tested every day: No Genitourinary Medical History: Negative for: End Stage Renal Disease Integumentary (Skin) Medical History: Negative for: History of Burn Musculoskeletal Medical History: Positive for: Gout; Osteoarthritis Negative for: Rheumatoid Arthritis; Osteomyelitis Neurologic Medical History: Positive for: Neuropathy Oncologic Medical History: Negative for: Received Chemotherapy; Received Radiation Psychiatric Medical History: Negative for: Anorexia/bulimia; Confinement Anxiety HBO Extended History Items Eyes: Cataracts Immunizations Pneumococcal Vaccine: Received Pneumococcal Vaccination: Yes Received Pneumococcal Vaccination On or After 60th Birthday: Yes Implantable Devices None Hospitalization / Surgery History Type of Hospitalization/Surgery cholecystectomy Family and Social History Cancer: Yes - Siblings; Diabetes: Yes - Siblings,Child; Heart Disease: Yes - Siblings; Hereditary Spherocytosis: No; Hypertension: Yes - Father,Siblings; Kidney Disease: No; Lung Disease: Yes - Siblings; Seizures: No; Stroke:  Yes - Siblings; Thyroid Problems: No; Tuberculosis: No; Former smoker - quit 10 yrs ago; Marital Status - Married; Alcohol Use: Never; Drug Use: No History; Caffeine Use: Daily - coffee; Financial Concerns: No; Food, Clothing or Shelter Needs: No; Support System Lacking: No; Transportation Concerns: No Electronic Signature(s) Signed: 11/03/2022 4:50:14 PM By: [name] DO Entered By: [name] on 11/03/2022 15:19:14 [name], [name] ([id number]) [id number] Page 8 of 8 -------------------------------------------------------------------------------- SuperBill Details Patient Name: Date of  Service: [name] 11/03/2022 Medical Record Number: [id number] Patient Account Number: [id number] Date of Birth/Sex: Treating RN: March 13, 1933 (89 y.o. M) Primary Care Provider: [name] Other Clinician: Referring Provider: Treating Provider/Extender: Caffie Pinto in Treatment: 17 Diagnosis Coding ICD-10 Codes Code Description [phone] Non-pressure chronic ulcer of other part of right foot with fat layer exposed E11.621 Type 2 diabetes mellitus with foot ulcer I70.235 Atherosclerosis of native arteries of right leg with ulceration of other part of foot I73.9 Peripheral vascular disease, unspecified Facility Procedures : CPT4 Code: [id number] Description: 99213 - WOUND CARE VISIT-LEV 3 EST PT Modifier: Quantity: 1 Physician Procedures : CPT4 Code Description Modifier [id number] - WC PHYS LEVEL 3 - EST PT ICD-10 Diagnosis Description L97.512 Non-pressure chronic ulcer of other part of right foot with fat layer exposed E11.621 Type 2 diabetes mellitus with foot ulcer I70.235  Atherosclerosis of native arteries of right leg with ulceration of other part of foot Quantity: 1 Electronic Signature(s) Signed: 11/03/2022 5:30:58 PM By: Karie Schwalbe RN Signed: 11/04/2022 10:55:12 AM By: [name] DO Previous Signature: 11/03/2022 4:50:14 PM Version By: [name] DO Entered By: Karie Schwalbe on 11/03/2022 17:04:52

## 2022-11-17 ENCOUNTER — Encounter (HOSPITAL_BASED_OUTPATIENT_CLINIC_OR_DEPARTMENT_OTHER): Payer: PPO | Attending: Internal Medicine | Admitting: Internal Medicine

## 2022-11-17 DIAGNOSIS — E1122 Type 2 diabetes mellitus with diabetic chronic kidney disease: Secondary | ICD-10-CM | POA: Diagnosis not present

## 2022-11-17 DIAGNOSIS — J4489 Other specified chronic obstructive pulmonary disease: Secondary | ICD-10-CM | POA: Insufficient documentation

## 2022-11-17 DIAGNOSIS — N183 Chronic kidney disease, stage 3 unspecified: Secondary | ICD-10-CM | POA: Diagnosis not present

## 2022-11-17 DIAGNOSIS — I70235 Atherosclerosis of native arteries of right leg with ulceration of other part of foot: Secondary | ICD-10-CM | POA: Diagnosis not present

## 2022-11-17 DIAGNOSIS — E1151 Type 2 diabetes mellitus with diabetic peripheral angiopathy without gangrene: Secondary | ICD-10-CM | POA: Insufficient documentation

## 2022-11-17 DIAGNOSIS — Z9049 Acquired absence of other specified parts of digestive tract: Secondary | ICD-10-CM | POA: Diagnosis not present

## 2022-11-17 DIAGNOSIS — Z89421 Acquired absence of other right toe(s): Secondary | ICD-10-CM | POA: Diagnosis not present

## 2022-11-17 DIAGNOSIS — Z87891 Personal history of nicotine dependence: Secondary | ICD-10-CM | POA: Diagnosis not present

## 2022-11-17 DIAGNOSIS — L97512 Non-pressure chronic ulcer of other part of right foot with fat layer exposed: Secondary | ICD-10-CM

## 2022-11-17 DIAGNOSIS — Z833 Family history of diabetes mellitus: Secondary | ICD-10-CM | POA: Insufficient documentation

## 2022-11-17 DIAGNOSIS — Z8249 Family history of ischemic heart disease and other diseases of the circulatory system: Secondary | ICD-10-CM | POA: Insufficient documentation

## 2022-11-17 DIAGNOSIS — Z794 Long term (current) use of insulin: Secondary | ICD-10-CM | POA: Insufficient documentation

## 2022-11-17 DIAGNOSIS — E11621 Type 2 diabetes mellitus with foot ulcer: Secondary | ICD-10-CM | POA: Diagnosis present

## 2022-11-17 NOTE — Progress Notes (Signed)
TENBUSCH, Zvi ([id number]) [id number] Page 1 of 9 Visit Report for 11/17/2022 Chief Complaint Document Details Patient Name: Date of Service: [name] [name] 11/17/2022 1:15 PM Medical Record Number: [id number] Patient Account Number: [id number] Date of Birth/Sex: Treating RN: May 22, 1933 (89 y.o. M) Primary Care Provider: [name] Other Clinician: Referring Provider: Treating Provider/Extender: Caffie Pinto in Treatment: 49 Information Obtained from: Patient Chief Complaint 07/04/2022; right foot wound Electronic Signature(s) Signed: 11/17/2022 3:50:21 PM By: [name] DO Entered By: [name] on 11/17/2022 14:59:54 -------------------------------------------------------------------------------- Debridement Details Patient Name: Date of Service: [name] [name] 11/17/2022 1:15 PM Medical Record Number: [id number] Patient Account Number: [id number] Date of Birth/Sex: Treating RN: Apr 30, 1933 (89 y.o. [name] Primary Care Provider: [name] Other Clinician: Referring Provider: Treating Provider/Extender: Caffie Pinto in Treatment: 19 Debridement Performed for Assessment: Wound #3 Right Amputation Site - Toe Performed By: Physician [name], DO The following information was scribed by: Karie Schwalbe The information was scribed for: [name] Debridement Type: Debridement Severity of Tissue Pre Debridement: Fat layer exposed Level of Consciousness (Pre-procedure): Awake and Alert Pre-procedure Verification/Time Out Yes - 13:51 Taken: Start Time: 13:51 Pain Control: Lidocaine 4% T opical Solution Percent of Wound Bed Debrided: 100% T Area Debrided (cm): otal 0.28 Tissue and other material debrided: Non-Viable, Slough, Slough Level: Non-Viable Tissue Debridement Description: Selective/Open Wound Instrument: Curette Bleeding:  Minimum Hemostasis Achieved: Pressure End Time: 13:52 Procedural Pain: 0 Post Procedural Pain: 0 Response to Treatment: Procedure was tolerated well Level of Consciousness (Post- Awake and Alert procedure): Post Debridement Measurements of Total Wound Length: (cm) 1.2 Width: (cm) 0.3 Depth: (cm) 0.2 Noone, Ramzy ([id number]) [id number] Page 2 of 9 Volume: (cm) 0.057 Character of Wound/Ulcer Post Debridement: Improved Severity of Tissue Post Debridement: Fat layer exposed Post Procedure Diagnosis Same as Pre-procedure Electronic Signature(s) Signed: 11/17/2022 2:25:26 PM By: Karie Schwalbe RN Signed: 11/17/2022 3:50:21 PM By: [name] DO Entered By: Karie Schwalbe on 11/17/2022 13:54:13 -------------------------------------------------------------------------------- HPI Details Patient Name: Date of Service: [name] [name] 11/17/2022 1:15 PM Medical Record Number: [id number] Patient Account Number: [id number] Date of Birth/Sex: Treating RN: [birth year] (89 y.o. M) Primary Care Provider: [name] Other Clinician: Referring Provider: Treating Provider/Extender: Caffie Pinto in Treatment: 66 History of Present Illness HPI Description: 07/04/2022 [name] is an 89 year old male with a past medical history of uncontrolled, insulin dependent type 2 diabetes, CKD stage III, and COPD that presents to the clinic for a right foot wound. He has an extensive history of peripheral arterial disease to the right leg. He has had a right superficial femoral to posterior tibial artery bypass by Dr. Elonda Husky in 2020. He had a right common femoral to plantar posterior tibial artery bypass on 02/03/2022 by Dr. Lenell Antu and at that time he also had a right toe amputation. He states this wound site had healed completely. Prior to this he had amputations to the first and second digit to the right foot. He  presents today with an open wound to the previous right toe amputation site for the past 2 months. He has been following with vein and vascular for this issue. Most recent ABIs were on 06/22/2022 that showed absent waveforms in the right lower extremity. Currently his right lower extremity bypass is occluded and no plan for another anteriorgram and has he likely has no revascularization options. Currently he has been using Betadine to the wound bed. He denies signs  of infection. 07/11/2022 patient arrives in clinic today using Hydrofera Blue on the toe amputation site. The patient is revascularized and is not felt to have any other options for revascularization. We are using Hydrofera Blue and gauze. 5/13; patient presents for follow-up. He has been using Hydrofera Blue to the toe amputation site. He has had no issues. Wound is smaller. He has been approved for [facility] and was agreeable to have this placed in office today. 5/20; patient presents for follow-up. We have been using Kerecis to the wound bed. There is been improvement in healing. Unfortunately patient did not change the outer dressing for a week. There is slight maceration to the periwound. Unclear why he did not do this. 5/30; patient presents for follow-up. We have been using Kerecis to the wound bed. More maceration to the periwound today. No signs of infection. 6/10; patient presents for follow-up. He has been using Hydrofera Blue to the wound bed. He has no issues or complaints today. Maceration has almost completely resolved. 6/18; patient presents for follow-up. He has been using blast X and collagen to the wound bed without issues. Wound appears smaller today. 6/28; patient presents for follow-up. He has been using blast X and collagen to the wound bed. The wound is smaller. 7/11; patient presents for follow-up. He has been using blast X and collagen to the wound bed. Wound is smaller. He has slough accumulation. 7/25; patient  presents for follow-up. He has been using blast X and collagen to the wound bed. Wound is stable. He denies signs of infection. 8/26; patient presents for follow-up. He has been using blast X and collagen to the wound bed. Wound is slightly smaller. He has no issues or complaints. 8/29; patient presents for follow-up. He has been using blast X and Aquacel Ag to the wound bed. He has been using a Velcro wrap at his toes to keep the toes closer together to help with wound healing. He has no issues or complaints today. Wound is smaller. 9/12; patient presents for follow-up. He has been using blast X and Aquacel Ag to the wound bed. Unfortunately he has developed a pressure injury to the lateral aspect of the foot. He is not sure how this started. No open wound there. He denies pain. Electronic Signature(s) Signed: 11/17/2022 3:50:21 PM By: [name] DO Entered By: [name] on 11/17/2022 15:14:06 Malecha, Baron ([id number]) [id number] Page 3 of 9 -------------------------------------------------------------------------------- Physical Exam Details Patient Name: Date of Service: [name] [name] 11/17/2022 1:15 PM Medical Record Number: [id number] Patient Account Number: [id number] Date of Birth/Sex: Treating RN: [birth year] (89 y.o. M) Primary Care Provider: [name] Other Clinician: Referring Provider: Treating Provider/Extender: Caffie Pinto in Treatment: 89 Constitutional respirations regular, non-labored and within target range for patient.. Cardiovascular 2+ dorsalis pedis/posterior tibialis pulses. Psychiatric pleasant and cooperative. Notes Third toe amputation site with granulation tissue and slough. No signs of surrounding infection. Darkened circular surface to the lateral right foot. No open wound. Electronic Signature(s) Signed: 11/17/2022 3:50:21 PM By: [name] DO Entered By: [name] on 11/17/2022 15:14:28 -------------------------------------------------------------------------------- Physician Orders Details Patient Name: Date of Service: [name] [name] 11/17/2022 1:15 PM Medical Record Number: [id number] Patient Account Number: [id number] Date of Birth/Sex: Treating RN: 01-Oct-1933 (89 y.o. [name] Primary Care Provider: [name] Other Clinician: Referring Provider: Treating Provider/Extender: Caffie Pinto in Treatment: 75 Verbal / Phone Orders: No Diagnosis Coding Follow-up Appointments ppointment in 1 week. -  Dr. Leanord Hawking Room 9 11/28/22 at 1:45pm Return A ppointment in 2 weeks. - Dr. Leanord Hawking Room 9 Please schedule with front desk Return A Return appointment in 3 weeks. - Dr. Leanord Hawking Room 9 Please schedule with front desk Return appointment in 1 month. - Dr. Leanord Hawking Please schedule with front desk Other: - [employer] blastx. Anesthetic (In clinic) Topical Lidocaine 5% applied to wound bed Bathing/ Shower/ Hygiene May shower with protection but do not get wound dressing(s) wet. Protect dressing(s) with water repellant cover (for example, large plastic bag) or a cast cover and may then take shower. Edema Control - Lymphedema / SCD / Other Elevate legs to the level of the heart or above for 30 minutes daily and/or when sitting for 3-4 times a day throughout the day. Avoid standing for long periods of time. Non Wound Condition Right Lower Extremity Other Non Wound Condition Orders/Instructions: - Right lateral foot -optifoam to protect non-wound Wound Treatment Wound #3 - Amputation Site - Toe Wound Laterality: Right AGUIAR, Ilay ([id number]) [id number] Page 4 of 9 Cleanser: Vashe 5.8 (oz) 1 x Per Day/30 Days Discharge Instructions: Cleanse the wound with Vashe prior to applying a clean dressing using gauze sponges, not tissue or cotton balls. Topical: BlastX (Generic) 1  x Per Day/30 Days Prim Dressing: aquacel Ag 1 x Per Day/30 Days ary Discharge Instructions: apply over the blastx to wound bed. Secondary Dressing: Woven Gauze Sponge, Non-Sterile 4x4 in (Generic) 1 x Per Day/30 Days Discharge Instructions: Apply over primary dressing as directed. Secured With: Paper Tape, 2x10 (in/yd) 1 x Per Day/30 Days Discharge Instructions: Secure dressing with tape as directed. Secured With: tubigrip size D 1 x Per Day/30 Days Discharge Instructions: if needed -double layer from toes to midfoot. Electronic Signature(s) Signed: 11/17/2022 3:50:21 PM By: [name] DO Previous Signature: 11/17/2022 2:25:26 PM Version By: Karie Schwalbe RN Entered By: [name] on 11/17/2022 15:14:41 -------------------------------------------------------------------------------- Problem List Details Patient Name: Date of Service: [name] [name] 11/17/2022 1:15 PM Medical Record Number: [id number] Patient Account Number: [id number] Date of Birth/Sex: Treating RN: June 22, 1933 (89 y.o. M) Primary Care Provider: [name] Other Clinician: Referring Provider: Treating Provider/Extender: Caffie Pinto in Treatment: 78 Active Problems ICD-10 Encounter Code Description Active Date MDM Diagnosis L97.512 Non-pressure chronic ulcer of other part of right foot with fat layer exposed 07/04/2022 No Yes E11.621 Type 2 diabetes mellitus with foot ulcer 07/04/2022 No Yes I70.235 Atherosclerosis of native arteries of right leg with ulceration of other part of 07/04/2022 No Yes foot I73.9 Peripheral vascular disease, unspecified 07/04/2022 No Yes Inactive Problems Resolved Problems Electronic Signature(s) Signed: 11/17/2022 3:50:21 PM By: [name] DO Entered By: [name] on 11/17/2022 14:59:28 Pellegrin, Stokes ([id number]) [id number] Page 5 of  9 -------------------------------------------------------------------------------- Progress Note Details Patient Name: Date of Service: [name] [name] 11/17/2022 1:15 PM Medical Record Number: [id number] Patient Account Number: [id number] Date of Birth/Sex: Treating RN: 02-02-1934 (89 y.o. M) Primary Care Provider: [name] Other Clinician: Referring Provider: Treating Provider/Extender: Caffie Pinto in Treatment: 51 Subjective Chief Complaint Information obtained from Patient 07/04/2022; right foot wound History of Present Illness (HPI) 07/04/2022 Mr. Rosio Sterner is an 89 year old male with a past medical history of uncontrolled, insulin dependent type 2 diabetes, CKD stage III, and COPD that presents to the clinic for a right foot wound. He has an extensive history of peripheral arterial disease to the right leg. He has had a right superficial femoral to posterior  tibial artery bypass by Dr. Elonda Husky in 2020. He had a right common femoral to plantar posterior tibial artery bypass on 02/03/2022 by Dr. Lenell Antu and at that time he also had a right toe amputation. He states this wound site had healed completely. Prior to this he had amputations to the first and second digit to the right foot. He presents today with an open wound to the previous right toe amputation site for the past 2 months. He has been following with vein and vascular for this issue. Most recent ABIs were on 06/22/2022 that showed absent waveforms in the right lower extremity. Currently his right lower extremity bypass is occluded and no plan for another anteriorgram and has he likely has no revascularization options. Currently he has been using Betadine to the wound bed. He denies signs of infection. 07/11/2022 patient arrives in clinic today using Hydrofera Blue on the toe amputation site. The patient is revascularized and is not felt to have any other options for revascularization.  We are using Hydrofera Blue and gauze. 5/13; patient presents for follow-up. He has been using Hydrofera Blue to the toe amputation site. He has had no issues. Wound is smaller. He has been approved for [facility] and was agreeable to have this placed in office today. 5/20; patient presents for follow-up. We have been using Kerecis to the wound bed. There is been improvement in healing. Unfortunately patient did not change the outer dressing for a week. There is slight maceration to the periwound. Unclear why he did not do this. 5/30; patient presents for follow-up. We have been using Kerecis to the wound bed. More maceration to the periwound today. No signs of infection. 6/10; patient presents for follow-up. He has been using Hydrofera Blue to the wound bed. He has no issues or complaints today. Maceration has almost completely resolved. 6/18; patient presents for follow-up. He has been using blast X and collagen to the wound bed without issues. Wound appears smaller today. 6/28; patient presents for follow-up. He has been using blast X and collagen to the wound bed. The wound is smaller. 7/11; patient presents for follow-up. He has been using blast X and collagen to the wound bed. Wound is smaller. He has slough accumulation. 7/25; patient presents for follow-up. He has been using blast X and collagen to the wound bed. Wound is stable. He denies signs of infection. 8/26; patient presents for follow-up. He has been using blast X and collagen to the wound bed. Wound is slightly smaller. He has no issues or complaints. 8/29; patient presents for follow-up. He has been using blast X and Aquacel Ag to the wound bed. He has been using a Velcro wrap at his toes to keep the toes closer together to help with wound healing. He has no issues or complaints today. Wound is smaller. 9/12; patient presents for follow-up. He has been using blast X and Aquacel Ag to the wound bed. Unfortunately he has developed a  pressure injury to the lateral aspect of the foot. He is not sure how this started. No open wound there. He denies pain. Patient History Information obtained from Patient. Family History Cancer - Siblings, Diabetes - Siblings,Child, Heart Disease - Siblings, Hypertension - Father,Siblings, Lung Disease - Siblings, Stroke - Siblings, No family history of Hereditary Spherocytosis, Kidney Disease, Seizures, Thyroid Problems, Tuberculosis. Social History Former smoker - quit 10 yrs ago, Marital Status - Married, Alcohol Use - Never, Drug Use - No History, Caffeine Use - Daily - coffee. Medical  History Eyes Patient has history of Cataracts - removed both eyes Denies history of Glaucoma, Optic Neuritis Ear/Nose/Mouth/Throat Denies history of Chronic sinus problems/congestion, Middle ear problems Hematologic/Lymphatic Patient has history of Anemia Denies history of Hemophilia, Human Immunodeficiency Virus, Lymphedema, Sickle Cell Disease Respiratory Patient has history of Asthma, Chronic Obstructive Pulmonary Disease (COPD) Denies history of Aspiration, Pneumothorax, Sleep Apnea, Tuberculosis Cardiovascular Patient has history of Peripheral Arterial Disease Dworkin, Curvin ([id number]) [id number] Page 6 of 9 Endocrine Patient has history of Type II Diabetes - prediabetes Denies history of Type I Diabetes Genitourinary Denies history of End Stage Renal Disease Integumentary (Skin) Denies history of History of Burn Musculoskeletal Patient has history of Gout, Osteoarthritis Denies history of Rheumatoid Arthritis, Osteomyelitis Neurologic Patient has history of Neuropathy Oncologic Denies history of Received Chemotherapy, Received Radiation Psychiatric Denies history of Anorexia/bulimia, Confinement Anxiety Hospitalization/Surgery History - cholecystectomy. Medical A Surgical History Notes nd Ear/Nose/Mouth/Throat hard of  hearing Objective Constitutional respirations regular, non-labored and within target range for patient.. Vitals Time Taken: 1:27 PM, Height: 71 in, Weight: 170 lbs, BMI: 23.7, Temperature: 97.7 F, Pulse: 88 bpm, Respiratory Rate: 18 breaths/min, Blood Pressure: 143/86 mmHg. Cardiovascular 2+ dorsalis pedis/posterior tibialis pulses. Psychiatric pleasant and cooperative. General Notes: Third toe amputation site with granulation tissue and slough. No signs of surrounding infection. Darkened circular surface to the lateral right foot. No open wound. Integumentary (Hair, Skin) Wound #3 status is Open. Original cause of wound was Gradually Appeared. The date acquired was: 04/07/2022. The wound has been in treatment 19 weeks. The wound is located on the Right Amputation Site - T The wound measures 1.2cm length x 0.3cm width x 0.2cm depth; 0.283cm^2 area and 0.057cm^3 volume. oe. There is Fat Layer (Subcutaneous Tissue) exposed. There is no tunneling or undermining noted. There is a medium amount of serosanguineous drainage noted. The wound margin is epibole. There is medium (34-66%) red, pink granulation within the wound bed. There is a medium (34-66%) amount of necrotic tissue within the wound bed including Adherent Slough. The periwound skin appearance exhibited: Maceration. The periwound skin appearance did not exhibit: Callus, Crepitus, Excoriation, Induration, Rash, Scarring, Dry/Scaly, Atrophie Blanche, Cyanosis, Ecchymosis, Hemosiderin Staining, Mottled, Pallor, Rubor, Erythema. Periwound temperature was noted as No Abnormality. Assessment Active Problems ICD-10 Non-pressure chronic ulcer of other part of right foot with fat layer exposed Type 2 diabetes mellitus with foot ulcer Atherosclerosis of native arteries of right leg with ulceration of other part of foot Peripheral vascular disease, unspecified Patient's wound is stable. I debrided nonviable tissue. I recommended continuing with  blast X and Aquacel Ag. Unfortunately he has developed a pressure injury to the lateral right foot. I recommended stopping his wrist neopren wrap that he has been using to keep the toes together. We will give him a foam doughnut to use with his surgical shoe to help offload this area (lateral foot). Follow-up in 1 week. Procedures Wound #3 Pre-procedure diagnosis of Wound #3 is a Diabetic Wound/Ulcer of the Lower Extremity located on the Right Amputation Site - T .Severity of Tissue Pre Stetsen Neptune, Wm ([id number]) [id number] Page 7 of 9 Debridement is: Fat layer exposed. There was a Selective/Open Wound Non-Viable Tissue Debridement with a total area of 0.28 sq cm performed by [name], DO. With the following instrument(s): Curette to remove Non-Viable tissue/material. Material removed includes [facility] after achieving pain control using Lidocaine 4% Topical Solution. No specimens were taken. A time out was conducted at 13:51, prior to the start of the procedure. A  Minimum amount of bleeding was controlled with Pressure. The procedure was tolerated well with a pain level of 0 throughout and a pain level of 0 following the procedure. Post Debridement Measurements: 1.2cm length x 0.3cm width x 0.2cm depth; 0.057cm^3 volume. Character of Wound/Ulcer Post Debridement is improved. Severity of Tissue Post Debridement is: Fat layer exposed. Post procedure Diagnosis Wound #3: Same as Pre-Procedure Plan Follow-up Appointments: Return Appointment in 1 week. - Dr. Leanord Hawking Room 9 11/28/22 at 1:45pm Return Appointment in 2 weeks. - Dr. Leanord Hawking Room 9 Please schedule with front desk Return appointment in 3 weeks. - Dr. Leanord Hawking Room 9 Please schedule with front desk Return appointment in 1 month. - Dr. Leanord Hawking Please schedule with front desk Other: - [employer] blastx. Anesthetic: (In clinic) Topical Lidocaine 5% applied to wound bed Bathing/ Shower/ Hygiene: May  shower with protection but do not get wound dressing(s) wet. Protect dressing(s) with water repellant cover (for example, large plastic bag) or a cast cover and may then take shower. Edema Control - Lymphedema / SCD / Other: Elevate legs to the level of the heart or above for 30 minutes daily and/or when sitting for 3-4 times a day throughout the day. Avoid standing for long periods of time. Non Wound Condition: Other Non Wound Condition Orders/Instructions: - Right lateral foot -optifoam to protect non-wound WOUND #3: - Amputation Site - T oe Wound Laterality: Right Cleanser: Vashe 5.8 (oz) 1 x Per Day/30 Days Discharge Instructions: Cleanse the wound with Vashe prior to applying a clean dressing using gauze sponges, not tissue or cotton balls. Topical: BlastX (Generic) 1 x Per Day/30 Days Prim Dressing: aquacel Ag 1 x Per Day/30 Days ary Discharge Instructions: apply over the blastx to wound bed. Secondary Dressing: Woven Gauze Sponge, Non-Sterile 4x4 in (Generic) 1 x Per Day/30 Days Discharge Instructions: Apply over primary dressing as directed. Secured With: Paper T ape, 2x10 (in/yd) 1 x Per Day/30 Days Discharge Instructions: Secure dressing with tape as directed. Secured With: tubigrip size D 1 x Per Day/30 Days Discharge Instructions: if needed -double layer from toes to midfoot. 1. In office sharp debridement 2. Aquacel Ag and blast X 3. Foam donut to the lateral right foot 4. Aggressive offloadingsurgical shoe Electronic Signature(s) Signed: 11/17/2022 3:50:21 PM By: [name] DO Entered By: [name] on 11/17/2022 15:15:52 -------------------------------------------------------------------------------- HxROS Details Patient Name: Date of Service: [name] [name] 11/17/2022 1:15 PM Medical Record Number: [id number] Patient Account Number: [id number] Date of Birth/Sex: Treating RN: 02-26-1934 (89 y.o. M) Primary Care Provider: [name] Other  Clinician: Referring Provider: Treating Provider/Extender: Caffie Pinto in Treatment: 67 Information Obtained From Patient Eyes Medical History: Positive for: Cataracts - removed both eyes Negative for: Glaucoma; Optic Neuritis Reinhart, Berridge Zeke ([id number]) [id number] Page 8 of 9 Ear/Nose/Mouth/Throat Medical History: Negative for: Chronic sinus problems/congestion; Middle ear problems Past Medical History Notes: hard of hearing Hematologic/Lymphatic Medical History: Positive for: Anemia Negative for: Hemophilia; Human Immunodeficiency Virus; Lymphedema; Sickle Cell Disease Respiratory Medical History: Positive for: Asthma; Chronic Obstructive Pulmonary Disease (COPD) Negative for: Aspiration; Pneumothorax; Sleep Apnea; Tuberculosis Cardiovascular Medical History: Positive for: Peripheral Arterial Disease Endocrine Medical History: Positive for: Type II Diabetes - prediabetes Negative for: Type I Diabetes Time with diabetes: 3 yrs ago Treated with: Diet Blood sugar tested every day: No Genitourinary Medical History: Negative for: End Stage Renal Disease Integumentary (Skin) Medical History: Negative for: History of Burn Musculoskeletal Medical History: Positive for: Gout; Osteoarthritis Negative for: Rheumatoid Arthritis;  Osteomyelitis Neurologic Medical History: Positive for: Neuropathy Oncologic Medical History: Negative for: Received Chemotherapy; Received Radiation Psychiatric Medical History: Negative for: Anorexia/bulimia; Confinement Anxiety HBO Extended History Items Eyes: Cataracts Immunizations Pneumococcal Vaccine: Received Pneumococcal Vaccination: Yes Received Pneumococcal Vaccination On or After 60th Birthday: Yes Implantable Devices None Hospitalization / Surgery History MARKHAM, [place] ([id number]) [id number] Page 9 of 9 Type of  Hospitalization/Surgery cholecystectomy Family and Social History Cancer: Yes - Siblings; Diabetes: Yes - Siblings,Child; Heart Disease: Yes - Siblings; Hereditary Spherocytosis: No; Hypertension: Yes - Father,Siblings; Kidney Disease: No; Lung Disease: Yes - Siblings; Seizures: No; Stroke: Yes - Siblings; Thyroid Problems: No; Tuberculosis: No; Former smoker - quit 10 yrs ago; Marital Status - Married; Alcohol Use: Never; Drug Use: No History; Caffeine Use: Daily - coffee; Financial Concerns: No; Food, Clothing or Shelter Needs: No; Support System Lacking: No; Transportation Concerns: No Electronic Signature(s) Signed: 11/17/2022 3:50:21 PM By: [name] DO Entered By: [name] on 11/17/2022 15:02:15 -------------------------------------------------------------------------------- SuperBill Details Patient Name: Date of Service: [name] [name] 11/17/2022 Medical Record Number: [id number] Patient Account Number: [id number] Date of Birth/Sex: Treating RN: 1933/05/28 (89 y.o. M) Primary Care Provider: [name] Other Clinician: Referring Provider: Treating Provider/Extender: Caffie Pinto in Treatment: 19 Diagnosis Coding ICD-10 Codes Code Description [phone] Non-pressure chronic ulcer of other part of right foot with fat layer exposed E11.621 Type 2 diabetes mellitus with foot ulcer I70.235 Atherosclerosis of native arteries of right leg with ulceration of other part of foot I73.9 Peripheral vascular disease, unspecified Facility Procedures : CPT4 Code: [id number] Description: 97597 - DEBRIDE WOUND 1ST 20 SQ CM OR < ICD-10 Diagnosis Description L97.512 Non-pressure chronic ulcer of other part of right foot with fat layer exposed E11.621 Type 2 diabetes mellitus with foot ulcer Modifier: Quantity: 1 Physician Procedures : CPT4 Code Description Modifier [id number] 97597 - WC PHYS DEBR WO ANESTH 20 SQ CM ICD-10 Diagnosis Description L97.512  Non-pressure chronic ulcer of other part of right foot with fat layer exposed E11.621 Type 2 diabetes mellitus with foot ulcer Quantity: 1 Electronic Signature(s) Signed: 11/17/2022 3:50:21 PM By: [name] DO Entered By: [name] on 11/17/2022 15:16:07

## 2022-11-18 NOTE — Progress Notes (Signed)
RN Entered By: [name] [name] on 11/17/2022 14:16:19 -------------------------------------------------------------------------------- Pain Assessment Details Patient Name: Date of Service: [name] [name] 11/17/2022 1:15 PM Medical Record Number: [id number] Patient Account Number: [id number] Date of Birth/Sex: Treating RN: 06-02-1933 (89 y.o. M) Primary Care [name] [name]: [name] [name] Other Clinician: Referring [name] [name]: Treating [name] [name]/Extender: [name] [name] in Treatment: 12 Active Problems Location of Pain  Severity and Description of Pain Patient Has Paino No Site Locations Pain Management and Medication Current Pain Management: Electronic Signature(s) Signed: 11/18/2022 11:49:10 AM By: [name] [name] Entered By: [name] [name] on 11/17/2022 13:28:15 -------------------------------------------------------------------------------- Patient/Caregiver Education Details Patient Name: Date of Service: [name] [name] 9/12/2024andnbsp1:15 PM Medical Record Number: [id number] Patient Account Number: [id number] Date of Birth/Gender: Treating RN: 14-Apr-1933 (89 y.o. [name] [name] Primary Care Physician: [name] [name] Other Clinician: Referring Physician: Treating Physician/Extender: [name] [name] in Treatment: 20 Education Assessment Education Provided To: Patient [name] [name], [name] [name] ([id number]) [id number] Page 6 of 8 Education Topics Provided Wound/Skin Impairment: Methods: Explain/Verbal Responses: State content correctly Electronic Signature(s) Signed: 11/17/2022 2:25:26 PM By: [name] Schwalbe RN Entered By: [name] [name] on 11/17/2022 14:17:26 -------------------------------------------------------------------------------- Wound Assessment Details Patient Name: Date of Service: [name] [name] 11/17/2022 1:15 PM Medical Record Number: [id number] Patient Account Number: [id number] Date of Birth/Sex: Treating RN: Dec 25, 1933 (89 y.o. M) Primary Care [name] [name]: [name] [name] Other Clinician: Referring [name] [name]: Treating [name] [name]/Extender: [name] [name] in Treatment: 19 Wound Status Wound Number: 3 Primary Diabetic Wound/Ulcer of the Lower Extremity Etiology: Wound Location: Right Amputation Site - Toe Wound Open Wounding Event: Gradually Appeared Status: Date Acquired: 04/07/2022 Comorbid Cataracts, Anemia, Asthma, Chronic Obstructive Pulmonary Weeks Of Treatment: 19 History: Disease  (COPD), Peripheral Arterial Disease, Type II Diabetes, Clustered Wound: No Gout, Osteoarthritis, Neuropathy Photos Wound Measurements Length: (cm) 1.2 Width: (cm) 0.3 Depth: (cm) 0.2 Area: (cm) 0.283 Volume: (cm) 0.057 % Reduction in Area: 91.1% % Reduction in Volume: 94% Epithelialization: Medium (34-66%) Tunneling: No Undermining: No Wound Description Classification: Grade 1 Wound Margin: Epibole Exudate Amount: Medium Exudate Type: Serosanguineous Exudate Color: red, brown Foul Odor After Cleansing: No Slough/Fibrino Yes Wound Bed Granulation Amount: Medium (34-66%) Exposed Structure Granulation Quality: Red, Pink Fascia Exposed: No Necrotic Amount: Medium (34-66%) Fat Layer (Subcutaneous Tissue) Exposed: Yes Necrotic Quality: Adherent Slough Tendon Exposed: No Muscle Exposed: No Joint Exposed: No Bone Exposed: No [name] [name], [name] [name] ([id number]) [id number] Page 7 of 8 Periwound Skin Texture Texture Color No Abnormalities Noted: No No Abnormalities Noted: No Callus: No Atrophie Blanche: No Crepitus: No Cyanosis: No Excoriation: No Ecchymosis: No Induration: No Erythema: No Rash: No Hemosiderin Staining: No Scarring: No Mottled: No Pallor: No Moisture Rubor: No No Abnormalities Noted: No Dry / Scaly: No Temperature / Pain Maceration: Yes Temperature: No Abnormality Treatment Notes Wound #3 (Amputation Site - Toe) Wound Laterality: Right Cleanser Vashe 5.8 (oz) Discharge Instruction: Cleanse the wound with Vashe prior to applying a clean dressing using gauze sponges, not tissue or cotton balls. Peri-Wound Care Topical BlastX Primary Dressing aquacel Ag Discharge Instruction: apply over the blastx to wound bed. Secondary Dressing Woven Gauze Sponge, Non-Sterile 4x4 in Discharge Instruction: Apply over primary dressing as directed. Secured With Paper Tape, 2x10 (in/yd) Discharge Instruction: Secure dressing with tape as  directed. tubigrip size D Discharge Instruction: if needed -double layer from toes to midfoot. Compression Wrap Compression Stockings Add-Ons Electronic Signature(s) Signed: 11/17/2022 2:25:26 PM By: [name] Schwalbe RN Entered By: [name] [name] on 11/17/2022 13:38:53 -------------------------------------------------------------------------------- Vitals Details Patient Name:  Wound Recurrence: 1.2x0.3x0.2 N/A N/A Measurements L x W x D (cm) 0.283 N/A N/A A (cm) : rea 0.057 N/A N/A Volume (cm) : 91.10% N/A N/A % Reduction in A rea: 94.00% N/A N/A % Reduction in Volume: Grade 1 N/A N/A Classification: Medium N/A N/A Exudate A mount: Serosanguineous N/A N/A Exudate Type: red, brown N/A N/A Exudate Color: Epibole N/A N/A Wound Margin: Medium (34-66%) N/A N/A Granulation A mount: Red, Pink N/A N/A Granulation Quality: Medium (34-66%) N/A N/A Necrotic A mount: Fat Layer (Subcutaneous Tissue): Yes N/A N/A Exposed Structures: Fascia: No Tendon: No Muscle: No Joint: No Bone: No Medium (34-66%) N/A N/A Epithelialization: Debridement - Selective/Open Wound N/A N/A Debridement: Pre-procedure Verification/Time Out 13:51 N/A N/A Taken: Lidocaine 4% Topical Solution N/A N/A Pain Control: Slough N/A N/A Tissue Debrided: Non-Viable Tissue N/A N/A Level: 0.28 N/A N/A Debridement A (sq cm): rea Curette N/A N/A Instrument: Minimum N/A N/A Bleeding: Pressure N/A N/A Hemostasis A chieved: 0  N/A N/A Procedural Pain: 0 N/A N/A Post Procedural Pain: Procedure was tolerated well N/A N/A Debridement Treatment Response: 1.2x0.3x0.2 N/A N/A Post Debridement Measurements L x W x D (cm) 0.057 N/A N/A Post Debridement Volume: (cm) Excoriation: No N/A N/A Periwound Skin Texture: Induration: No Callus: No Crepitus: No Rash: No Scarring: No Maceration: Yes N/A N/A Periwound Skin Moisture: Dry/Scaly: No Atrophie Blanche: No N/A N/A Periwound Skin Color: Cyanosis: No Ecchymosis: No Erythema: No Hemosiderin Staining: No Mottled: No Pallor: No Rubor: No No Abnormality N/A N/A Temperature: Debridement N/A N/A Procedures Performed: Treatment Notes Wound #3 (Amputation Site - Toe) Wound Laterality: Right Cleanser Vashe 5.8 (oz) Discharge Instruction: Cleanse the wound with Vashe prior to applying a clean dressing using gauze sponges, not tissue or cotton balls. Peri-Wound Care Topical TIGRAN RICCHIO, [name] [name] ([id number]) [id number] Page 4 of 8 Primary Dressing aquacel Ag Discharge Instruction: apply over the blastx to wound bed. Secondary Dressing Woven Gauze Sponge, Non-Sterile 4x4 in Discharge Instruction: Apply over primary dressing as directed. Secured With Paper Tape, 2x10 (in/yd) Discharge Instruction: Secure dressing with tape as directed. tubigrip size D Discharge Instruction: if needed -double layer from toes to midfoot. Compression Wrap Compression Stockings Add-Ons Electronic Signature(s) Signed: 11/17/2022 3:50:21 PM By: [name] DO Entered By: [name] on 11/17/2022 14:59:39 -------------------------------------------------------------------------------- Multi-Disciplinary Care Plan Details Patient Name: Date of Service: [name] [name] 11/17/2022 1:15 PM Medical Record Number: [id number] Patient Account Number: [id number] Date of Birth/Sex: Treating RN: 1934-02-04 (89 y.o. [name] [name] Primary Care Amirah Goerke: [name] [name] Other Clinician: Referring Asencion Guisinger: Treating Anahla Bevis/Extender: [name] [name] in Treatment: 48 Active Inactive Wound/Skin Impairment Nursing Diagnoses: Impaired tissue integrity Knowledge deficit related to ulceration/compromised skin integrity Goals: Patient will have a decrease in wound volume by X% from date: (specify in notes) Date Initiated: 07/04/2022 Target Resolution Date: 02/04/2023 Goal Status: Active Patient/caregiver will verbalize understanding of skin care regimen Date Initiated: 07/04/2022 Target Resolution Date: 02/04/2023 Goal Status: Active Ulcer/skin breakdown will have a volume reduction of 30% by week 4 Date Initiated: 07/04/2022 Date Inactivated: 10/27/2022 Target Resolution Date: 11/05/2022 Goal Status: Met Ulcer/skin breakdown will have a volume reduction of 50% by week 8 Date Initiated: 07/04/2022 Date Inactivated: 10/27/2022 Target Resolution Date: 11/05/2022 Goal Status: Met Interventions: Assess patient/caregiver ability to obtain necessary supplies Assess patient/caregiver ability to perform ulcer/skin care regimen upon admission and as needed Assess ulceration(s) every visit Notes: Electronic Signature(s) Eldora, [name] [name] ([id number]) [id number] Page 5 of 8 Signed: 11/17/2022 2:25:26 PM By: [name] Schwalbe  RN Entered By: [name] [name] on 11/17/2022 14:16:19 -------------------------------------------------------------------------------- Pain Assessment Details Patient Name: Date of Service: [name] [name] 11/17/2022 1:15 PM Medical Record Number: [id number] Patient Account Number: [id number] Date of Birth/Sex: Treating RN: 06-02-1933 (89 y.o. M) Primary Care [name] [name]: [name] [name] Other Clinician: Referring [name] [name]: Treating [name] [name]/Extender: [name] [name] in Treatment: 12 Active Problems Location of Pain  Severity and Description of Pain Patient Has Paino No Site Locations Pain Management and Medication Current Pain Management: Electronic Signature(s) Signed: 11/18/2022 11:49:10 AM By: [name] [name] Entered By: [name] [name] on 11/17/2022 13:28:15 -------------------------------------------------------------------------------- Patient/Caregiver Education Details Patient Name: Date of Service: [name] [name] 9/12/2024andnbsp1:15 PM Medical Record Number: [id number] Patient Account Number: [id number] Date of Birth/Gender: Treating RN: 14-Apr-1933 (89 y.o. [name] [name] Primary Care Physician: [name] [name] Other Clinician: Referring Physician: Treating Physician/Extender: [name] [name] in Treatment: 20 Education Assessment Education Provided To: Patient [name] [name], [name] [name] ([id number]) [id number] Page 6 of 8 Education Topics Provided Wound/Skin Impairment: Methods: Explain/Verbal Responses: State content correctly Electronic Signature(s) Signed: 11/17/2022 2:25:26 PM By: [name] Schwalbe RN Entered By: [name] [name] on 11/17/2022 14:17:26 -------------------------------------------------------------------------------- Wound Assessment Details Patient Name: Date of Service: [name] [name] 11/17/2022 1:15 PM Medical Record Number: [id number] Patient Account Number: [id number] Date of Birth/Sex: Treating RN: Dec 25, 1933 (89 y.o. M) Primary Care [name] [name]: [name] [name] Other Clinician: Referring [name] [name]: Treating [name] [name]/Extender: [name] [name] in Treatment: 19 Wound Status Wound Number: 3 Primary Diabetic Wound/Ulcer of the Lower Extremity Etiology: Wound Location: Right Amputation Site - Toe Wound Open Wounding Event: Gradually Appeared Status: Date Acquired: 04/07/2022 Comorbid Cataracts, Anemia, Asthma, Chronic Obstructive Pulmonary Weeks Of Treatment: 19 History: Disease  (COPD), Peripheral Arterial Disease, Type II Diabetes, Clustered Wound: No Gout, Osteoarthritis, Neuropathy Photos Wound Measurements Length: (cm) 1.2 Width: (cm) 0.3 Depth: (cm) 0.2 Area: (cm) 0.283 Volume: (cm) 0.057 % Reduction in Area: 91.1% % Reduction in Volume: 94% Epithelialization: Medium (34-66%) Tunneling: No Undermining: No Wound Description Classification: Grade 1 Wound Margin: Epibole Exudate Amount: Medium Exudate Type: Serosanguineous Exudate Color: red, brown Foul Odor After Cleansing: No Slough/Fibrino Yes Wound Bed Granulation Amount: Medium (34-66%) Exposed Structure Granulation Quality: Red, Pink Fascia Exposed: No Necrotic Amount: Medium (34-66%) Fat Layer (Subcutaneous Tissue) Exposed: Yes Necrotic Quality: Adherent Slough Tendon Exposed: No Muscle Exposed: No Joint Exposed: No Bone Exposed: No [name] [name], [name] [name] ([id number]) [id number] Page 7 of 8 Periwound Skin Texture Texture Color No Abnormalities Noted: No No Abnormalities Noted: No Callus: No Atrophie Blanche: No Crepitus: No Cyanosis: No Excoriation: No Ecchymosis: No Induration: No Erythema: No Rash: No Hemosiderin Staining: No Scarring: No Mottled: No Pallor: No Moisture Rubor: No No Abnormalities Noted: No Dry / Scaly: No Temperature / Pain Maceration: Yes Temperature: No Abnormality Treatment Notes Wound #3 (Amputation Site - Toe) Wound Laterality: Right Cleanser Vashe 5.8 (oz) Discharge Instruction: Cleanse the wound with Vashe prior to applying a clean dressing using gauze sponges, not tissue or cotton balls. Peri-Wound Care Topical BlastX Primary Dressing aquacel Ag Discharge Instruction: apply over the blastx to wound bed. Secondary Dressing Woven Gauze Sponge, Non-Sterile 4x4 in Discharge Instruction: Apply over primary dressing as directed. Secured With Paper Tape, 2x10 (in/yd) Discharge Instruction: Secure dressing with tape as  directed. tubigrip size D Discharge Instruction: if needed -double layer from toes to midfoot. Compression Wrap Compression Stockings Add-Ons Electronic Signature(s) Signed: 11/17/2022 2:25:26 PM By: [name] Schwalbe RN Entered By: [name] [name] on 11/17/2022 13:38:53 -------------------------------------------------------------------------------- Vitals Details Patient Name:  [name] [name], [name] [name] ([id number]) [id number] Page 1 of 8 Visit Report for 11/17/2022 Arrival Information Details Patient Name: Date of Service: [name] [name] 11/17/2022 1:15 PM Medical Record Number: [id number] Patient Account Number: [id number] Date of Birth/Sex: Treating RN: 1933/11/26 (89 y.o. M) Primary Care Ayaan Shutes: [name] [name] Other Clinician: Referring Jabori Henegar: Treating Maison Agrusa/Extender: [name] [name] in Treatment: 19 Visit Information History Since Last Visit Added or deleted any medications: No Patient Arrived: Ambulatory Any new allergies or adverse reactions: No Arrival Time: 13:27 Had a fall or experienced change in No Accompanied By: daughter activities of daily living that may affect Transfer Assistance: None risk of falls: Patient Identification Verified: Yes Signs or symptoms of abuse/neglect since last visito No Secondary Verification Process Completed: Yes Hospitalized since last visit: No Patient Requires Transmission-Based Precautions: No Implantable device outside of the clinic excluding No Patient Has Alerts: Yes cellular tissue based products placed in the center Patient Alerts: Patient on Blood Thinner since last visit: Has Dressing in Place as Prescribed: Yes Pain Present Now: No Electronic Signature(s) Signed: 11/18/2022 11:49:10 AM By: [name] [name] Entered By: [name] [name] on 11/17/2022 13:27:50 -------------------------------------------------------------------------------- Encounter Discharge Information Details Patient Name: Date of Service: [name] [name] 11/17/2022 1:15 PM Medical Record Number: [id number] Patient Account Number: [id number] Date of Birth/Sex: Treating RN: [birth year] (89 y.o. [name] [name] Primary Care Lakishia Bourassa: [name] [name] Other Clinician: Referring Iridiana Fonner: Treating Ceferino Lang/Extender: [name] [name] in Treatment:  85 Encounter Discharge Information Items Post Procedure Vitals Discharge Condition: Stable Temperature (F): 97.7 Ambulatory Status: Ambulatory Pulse (bpm): 88 Discharge Destination: Home Respiratory Rate (breaths/min): 18 Transportation: Private Auto Blood Pressure (mmHg): 143/86 Accompanied By: self Schedule Follow-up Appointment: Yes Clinical Summary of Care: Patient Declined Electronic Signature(s) Signed: 11/17/2022 2:25:26 PM By: [name] Schwalbe RN Entered By: [name] [name] on 11/17/2022 14:25:01 Hilyer, [name] [name] ([id number]) [id number] Page 2 of 8 -------------------------------------------------------------------------------- Lower Extremity Assessment Details Patient Name: Date of Service: [name] [name] 11/17/2022 1:15 PM Medical Record Number: [id number] Patient Account Number: [id number] Date of Birth/Sex: Treating RN: 10-17-1933 (89 y.o. M) Primary Care Kimothy Kishimoto: [name] [name] Other Clinician: Referring Lille Karim: Treating Makyah Lavigne/Extender: [name] [name] in Treatment: 19 Edema Assessment Assessed: [Left: No] [Right: No] Edema: [Left: N] [Right: o] Calf Left: Right: Point of Measurement: From Medial Instep 32.2 cm Ankle Left: Right: Point of Measurement: From Medial Instep 20.5 cm Vascular Assessment Extremity colors, hair growth, and conditions: Extremity Color: [Right:Normal] Hair Growth on Extremity: [Right:No] Temperature of Extremity: [Right:Warm] Capillary Refill: [Right:< 3 seconds] Dependent Rubor: [Right:No No] Electronic Signature(s) Signed: 11/18/2022 11:49:10 AM By: [name] [name] Entered By: [name] [name] on 11/17/2022 13:28:27 -------------------------------------------------------------------------------- Multi Wound Chart Details Patient Name: Date of Service: [name] [name] 11/17/2022 1:15 PM Medical Record Number: [id number] Patient Account Number: [id number] Date of  Birth/Sex: Treating RN: 09-02-1933 (89 y.o. M) Primary Care Jaquel Coomer: [name] [name] Other Clinician: Referring Karmel Patricelli: Treating Vernee Baines/Extender: [name] [name] in Treatment: 19 Vital Signs Height(in): 71 Pulse(bpm): 88 Weight(lbs): 170 Blood Pressure(mmHg): 143/86 Body Mass Index(BMI): 23.7 Temperature(F): 97.7 Respiratory Rate(breaths/min): 18 [3:Photos:] [N/A:N/A] Right Amputation Site - Toe N/A N/A Wound Location: Gradually Appeared N/A N/A Wounding Event: Diabetic Wound/Ulcer of the Lower N/A N/A Primary Etiology: Extremity Cataracts, Anemia, Asthma, Chronic N/A N/A Comorbid History: Obstructive Pulmonary Disease (COPD), Peripheral Arterial Disease, Type II Diabetes, Gout, Osteoarthritis, Neuropathy 04/07/2022 N/A N/A Date Acquired: 86 N/A N/A Weeks of Treatment: Open N/A N/A Wound Status: No N/A N/A

## 2022-11-23 ENCOUNTER — Other Ambulatory Visit: Payer: Self-pay

## 2022-11-23 MED ORDER — TIOTROPIUM BROMIDE MONOHYDRATE 18 MCG IN CAPS: ORAL_CAPSULE | RESPIRATORY_TRACT | 2 refills | Status: DC

## 2022-11-25 ENCOUNTER — Other Ambulatory Visit: Payer: Self-pay | Admitting: Student

## 2022-11-25 DIAGNOSIS — M1A9XX0 Chronic gout, unspecified, without tophus (tophi): Secondary | ICD-10-CM

## 2022-11-28 ENCOUNTER — Encounter (HOSPITAL_BASED_OUTPATIENT_CLINIC_OR_DEPARTMENT_OTHER): Payer: PPO | Admitting: Internal Medicine

## 2022-11-28 DIAGNOSIS — E1151 Type 2 diabetes mellitus with diabetic peripheral angiopathy without gangrene: Secondary | ICD-10-CM | POA: Diagnosis not present

## 2022-11-28 NOTE — Progress Notes (Addendum)
surgical wound 2. Continue to pad the area on the fifth met head. Will have to see how long that this stays intact. The skin at that point may need to be removed Electronic Signature(s) Signed: 11/30/2022 7:24:00 PM By: Shawn Stall RN, BSN Signed: 12/02/2022 9:31:55 AM By: [name] MD Previous Signature: 11/28/2022 3:56:56 PM Version By: [name] MD Entered By: Shawn Stall on 11/30/2022 16:16:49 -------------------------------------------------------------------------------- SuperBill Details Patient Name: Date of Service: [name] [name] 11/28/2022 Medical Record Number: [id number] Patient Account Number: [id number] Date of Birth/Sex: Treating RN: 1933/04/08 (89 y.o. M) Primary Care Provider: [name] Other Clinician: Referring Provider: Treating Provider/Extender: [name] in Treatment: 21 Diagnosis Coding ICD-10 Codes Code Description [phone] Non-pressure chronic ulcer of other part of right foot with fat layer exposed E11.621 Type 2 diabetes mellitus with foot ulcer I70.235 Atherosclerosis of native arteries of right leg with ulceration of other part of foot I73.9 Peripheral vascular disease, unspecified Facility Procedures : CPT4 Code: [id number] Description: 97597 - DEBRIDE WOUND 1ST 20 SQ CM OR < ICD-10 Diagnosis Description L97.512 Non-pressure chronic ulcer of other part of right foot with fat layer exposed E11.621 Type 2 diabetes mellitus with foot ulcer Modifier: Quantity: 1 Physician Procedures : CPT4 Code Description Modifier [id number] 97597 - WC PHYS DEBR WO ANESTH 20 SQ CM ICD-10 Diagnosis Description L97.512 Non-pressure chronic ulcer of other part of right foot with fat layer exposed E11.621 Type 2 diabetes mellitus with foot ulcer Quantity: 1 Electronic  Signature(s) Signed: 11/28/2022 3:56:56 PM By: [name] MD Entered By: [name] on 11/28/2022 11:43:57 Surowiec, Stevenson ([id number]) [id number] Page 7 of 7  ANGER, Fionn ([id number]) [id number] Page 1 of 7 Visit Report for 11/28/2022 Debridement Details Patient Name: Date of Service: [name] [name] 11/28/2022 1:45 PM Medical Record Number: [id number] Patient Account Number: [id number] Date of Birth/Sex: Treating RN: [birth year] (89 y.o. M) Primary Care Provider: [name] Other Clinician: Referring Provider: Treating Provider/Extender: [name] in Treatment: 21 Debridement Performed for Assessment: Wound #3 Right Amputation Site - Toe Performed By: Physician Maxwell Caul., MD Debridement Type: Debridement Severity of Tissue Pre Debridement: Fat layer exposed Level of Consciousness (Pre-procedure): Awake and Alert Pre-procedure Verification/Time Out Yes - 14:20 Taken: Start Time: 14:23 Pain Control: Lidocaine 4% T opical Solution Percent of Wound Bed Debrided: 100% T Area Debrided (cm): otal 0.28 Tissue and other material debrided: Non-Viable, Slough, Slough Level: Non-Viable Tissue Debridement Description: Selective/Open Wound Instrument: Curette Bleeding: Minimum Hemostasis Achieved: Pressure Response to Treatment: Procedure was tolerated well Level of Consciousness (Post- Awake and Alert procedure): Post Debridement Measurements of Total Wound Length: (cm) 1.8 Width: (cm) 0.2 Depth: (cm) 0.3 Volume: (cm) 0.085 Character of Wound/Ulcer Post Debridement: Improved Severity of Tissue Post Debridement: Fat layer exposed Post Procedure Diagnosis Same as Pre-procedure Electronic Signature(s) Signed: 11/28/2022 3:56:56 PM By: [name] MD Entered By: [name] on 11/28/2022 11:34:44 -------------------------------------------------------------------------------- HPI Details Patient Name: Date of Service: [name] [name] 11/28/2022 1:45 PM Medical Record Number: [id number] Patient Account Number: [id number] Date of Birth/Sex: Treating  RN: April 26, 1933 (89 y.o. M) Primary Care Provider: [name] Other Clinician: Referring Provider: Treating Provider/Extender: [name] in Treatment: 21 History of Present Illness HPI Description: 07/04/2022 [name], [name] ([id number]) [id number] Page 2 of 7 [name] is an 89 year old male with a past medical history of uncontrolled, insulin dependent type 2 diabetes, CKD stage III, and COPD that presents to the clinic for a right foot wound. He has an extensive history of peripheral arterial disease to the right leg. He has had a right superficial femoral to posterior tibial artery bypass by Dr. Elonda Husky in 2020. He had a right common femoral to plantar posterior tibial artery bypass on 02/03/2022 by Dr. Lenell Antu and at that time he also had a right toe amputation. He states this wound site had healed completely. Prior to this he had amputations to the first and second digit to the right foot. He presents today with an open wound to the previous right toe amputation site for the past 2 months. He has been following with vein and vascular for this issue. Most recent ABIs were on 06/22/2022 that showed absent waveforms in the right lower extremity. Currently his right lower extremity bypass is occluded and no plan for another anteriorgram and has he likely has no revascularization options. Currently he has been using Betadine to the wound bed. He denies signs of infection. 07/11/2022 patient arrives in clinic today using Hydrofera Blue on the toe amputation site. The patient is revascularized and is not felt to have any other options for revascularization. We are using Hydrofera Blue and gauze. 5/13; patient presents for follow-up. He has been using Hydrofera Blue to the toe amputation site. He has had no issues. Wound is smaller. He has been approved for [facility] and was agreeable to have this placed in office today. 5/20;  patient presents for follow-up. We have been using Kerecis to the wound bed. There is been improvement in healing. Unfortunately patient did not change the outer dressing for a week. There is slight maceration to the  periwound. Unclear why he did not do this. 5/30; patient presents for follow-up. We have been using Kerecis to the wound bed. More maceration to the periwound today. No signs of infection. 6/10; patient presents for follow-up. He has been using Hydrofera Blue to the wound bed. He has no issues or complaints today. Maceration has almost completely resolved. 6/18; patient presents for follow-up. He has been using blast X and collagen to the wound bed without issues. Wound appears smaller today. 6/28; patient presents for follow-up. He has been using blast X and collagen to the wound bed. The wound is smaller. 7/11; patient presents for follow-up. He has been using blast X and collagen to the wound bed. Wound is smaller. He has slough accumulation. 7/25; patient presents for follow-up. He has been using blast X and collagen to the wound bed. Wound is stable. He denies signs of infection. 8/26; patient presents for follow-up. He has been using blast X and collagen to the wound bed. Wound is slightly smaller. He has no issues or complaints. 8/29; patient presents for follow-up. He has been using blast X and Aquacel Ag to the wound bed. He has been using a Velcro wrap at his toes to keep the toes closer together to help with wound healing. He has no issues or complaints today. Wound is smaller. 9/12; patient presents for follow-up. He has been using blast X and Aquacel Ag to the wound bed. Unfortunately he has developed a pressure injury to the lateral aspect of the foot. He is not sure how this started. No open wound there. He denies pain. 9/23; this patient has a wound on the right foot at the amputation site of her third toe. It is right up against the fourth toe making it somewhat difficult  to dress. When he was here last time he had a blister/DTI on the lateral aspect of the fifth MTP Electronic Signature(s) Signed: 11/28/2022 3:56:56 PM By: [name] MD Entered By: [name] on 11/28/2022 11:37:20 -------------------------------------------------------------------------------- Physical Exam Details Patient Name: Date of Service: [name] [name] 11/28/2022 1:45 PM Medical Record Number: [id number] Patient Account Number: [id number] Date of Birth/Sex: Treating RN: [birth year] (89 y.o. M) Primary Care Provider: [name] Other Clinician: Referring Provider: Treating Provider/Extender: [name] in Treatment: 21 Notes Wound exam; third toe amputation site this is against the medial part of the fourth toe. Unfortunately still some raised edges around the wound. Adherent slough removed with a #3 curette hemostasis with direct pressure He has a hemorrhagic blister on the lateral part of the fifth MTP. This apparently has come down since last week Electronic Signature(s) Signed: 11/28/2022 3:56:56 PM By: [name] MD Entered By: [name] on 11/28/2022 11:42:39 Kring, Zakariah ([id number]) [id number] Page 3 of 7 -------------------------------------------------------------------------------- Physician Orders Details Patient Name: Date of Service: [name] [name] 11/28/2022 1:45 PM Medical Record Number: [id number] Patient Account Number: [id number] Date of Birth/Sex: Treating RN: [birth year] (89 y.o. [name] Primary Care Provider: [name] Other Clinician: Referring Provider: Treating Provider/Extender: [name] in Treatment: 21 Verbal / Phone Orders: No Diagnosis Coding Follow-up Appointments ppointment in 1 week. - Dr. Leanord Hawking Room 9 12/05/22 at 3:15pm Return A ppointment in 2 weeks. - Dr. Leanord Hawking Room 9 Please schedule with front  desk Return A Return appointment in 3 weeks. - Dr. Leanord Hawking Room 9 Please schedule with front desk Return appointment in 1 month. - Dr. Leanord Hawking Please schedule with front desk  periwound. Unclear why he did not do this. 5/30; patient presents for follow-up. We have been using Kerecis to the wound bed. More maceration to the periwound today. No signs of infection. 6/10; patient presents for follow-up. He has been using Hydrofera Blue to the wound bed. He has no issues or complaints today. Maceration has almost completely resolved. 6/18; patient presents for follow-up. He has been using blast X and collagen to the wound bed without issues. Wound appears smaller today. 6/28; patient presents for follow-up. He has been using blast X and collagen to the wound bed. The wound is smaller. 7/11; patient presents for follow-up. He has been using blast X and collagen to the wound bed. Wound is smaller. He has slough accumulation. 7/25; patient presents for follow-up. He has been using blast X and collagen to the wound bed. Wound is stable. He denies signs of infection. 8/26; patient presents for follow-up. He has been using blast X and collagen to the wound bed. Wound is slightly smaller. He has no issues or complaints. 8/29; patient presents for follow-up. He has been using blast X and Aquacel Ag to the wound bed. He has been using a Velcro wrap at his toes to keep the toes closer together to help with wound healing. He has no issues or complaints today. Wound is smaller. 9/12; patient presents for follow-up. He has been using blast X and Aquacel Ag to the wound bed. Unfortunately he has developed a pressure injury to the lateral aspect of the foot. He is not sure how this started. No open wound there. He denies pain. 9/23; this patient has a wound on the right foot at the amputation site of her third toe. It is right up against the fourth toe making it somewhat difficult  to dress. When he was here last time he had a blister/DTI on the lateral aspect of the fifth MTP Electronic Signature(s) Signed: 11/28/2022 3:56:56 PM By: [name] MD Entered By: [name] on 11/28/2022 11:37:20 -------------------------------------------------------------------------------- Physical Exam Details Patient Name: Date of Service: [name] [name] 11/28/2022 1:45 PM Medical Record Number: [id number] Patient Account Number: [id number] Date of Birth/Sex: Treating RN: [birth year] (89 y.o. M) Primary Care Provider: [name] Other Clinician: Referring Provider: Treating Provider/Extender: [name] in Treatment: 21 Notes Wound exam; third toe amputation site this is against the medial part of the fourth toe. Unfortunately still some raised edges around the wound. Adherent slough removed with a #3 curette hemostasis with direct pressure He has a hemorrhagic blister on the lateral part of the fifth MTP. This apparently has come down since last week Electronic Signature(s) Signed: 11/28/2022 3:56:56 PM By: [name] MD Entered By: [name] on 11/28/2022 11:42:39 Kring, Zakariah ([id number]) [id number] Page 3 of 7 -------------------------------------------------------------------------------- Physician Orders Details Patient Name: Date of Service: [name] [name] 11/28/2022 1:45 PM Medical Record Number: [id number] Patient Account Number: [id number] Date of Birth/Sex: Treating RN: [birth year] (89 y.o. [name] Primary Care Provider: [name] Other Clinician: Referring Provider: Treating Provider/Extender: [name] in Treatment: 21 Verbal / Phone Orders: No Diagnosis Coding Follow-up Appointments ppointment in 1 week. - Dr. Leanord Hawking Room 9 12/05/22 at 3:15pm Return A ppointment in 2 weeks. - Dr. Leanord Hawking Room 9 Please schedule with front  desk Return A Return appointment in 3 weeks. - Dr. Leanord Hawking Room 9 Please schedule with front desk Return appointment in 1 month. - Dr. Leanord Hawking Please schedule with front desk  ANGER, Fionn ([id number]) [id number] Page 1 of 7 Visit Report for 11/28/2022 Debridement Details Patient Name: Date of Service: [name] [name] 11/28/2022 1:45 PM Medical Record Number: [id number] Patient Account Number: [id number] Date of Birth/Sex: Treating RN: [birth year] (89 y.o. M) Primary Care Provider: [name] Other Clinician: Referring Provider: Treating Provider/Extender: [name] in Treatment: 21 Debridement Performed for Assessment: Wound #3 Right Amputation Site - Toe Performed By: Physician Maxwell Caul., MD Debridement Type: Debridement Severity of Tissue Pre Debridement: Fat layer exposed Level of Consciousness (Pre-procedure): Awake and Alert Pre-procedure Verification/Time Out Yes - 14:20 Taken: Start Time: 14:23 Pain Control: Lidocaine 4% T opical Solution Percent of Wound Bed Debrided: 100% T Area Debrided (cm): otal 0.28 Tissue and other material debrided: Non-Viable, Slough, Slough Level: Non-Viable Tissue Debridement Description: Selective/Open Wound Instrument: Curette Bleeding: Minimum Hemostasis Achieved: Pressure Response to Treatment: Procedure was tolerated well Level of Consciousness (Post- Awake and Alert procedure): Post Debridement Measurements of Total Wound Length: (cm) 1.8 Width: (cm) 0.2 Depth: (cm) 0.3 Volume: (cm) 0.085 Character of Wound/Ulcer Post Debridement: Improved Severity of Tissue Post Debridement: Fat layer exposed Post Procedure Diagnosis Same as Pre-procedure Electronic Signature(s) Signed: 11/28/2022 3:56:56 PM By: [name] MD Entered By: [name] on 11/28/2022 11:34:44 -------------------------------------------------------------------------------- HPI Details Patient Name: Date of Service: [name] [name] 11/28/2022 1:45 PM Medical Record Number: [id number] Patient Account Number: [id number] Date of Birth/Sex: Treating  RN: April 26, 1933 (89 y.o. M) Primary Care Provider: [name] Other Clinician: Referring Provider: Treating Provider/Extender: [name] in Treatment: 21 History of Present Illness HPI Description: 07/04/2022 [name], [name] ([id number]) [id number] Page 2 of 7 [name] is an 89 year old male with a past medical history of uncontrolled, insulin dependent type 2 diabetes, CKD stage III, and COPD that presents to the clinic for a right foot wound. He has an extensive history of peripheral arterial disease to the right leg. He has had a right superficial femoral to posterior tibial artery bypass by Dr. Elonda Husky in 2020. He had a right common femoral to plantar posterior tibial artery bypass on 02/03/2022 by Dr. Lenell Antu and at that time he also had a right toe amputation. He states this wound site had healed completely. Prior to this he had amputations to the first and second digit to the right foot. He presents today with an open wound to the previous right toe amputation site for the past 2 months. He has been following with vein and vascular for this issue. Most recent ABIs were on 06/22/2022 that showed absent waveforms in the right lower extremity. Currently his right lower extremity bypass is occluded and no plan for another anteriorgram and has he likely has no revascularization options. Currently he has been using Betadine to the wound bed. He denies signs of infection. 07/11/2022 patient arrives in clinic today using Hydrofera Blue on the toe amputation site. The patient is revascularized and is not felt to have any other options for revascularization. We are using Hydrofera Blue and gauze. 5/13; patient presents for follow-up. He has been using Hydrofera Blue to the toe amputation site. He has had no issues. Wound is smaller. He has been approved for [facility] and was agreeable to have this placed in office today. 5/20;  patient presents for follow-up. We have been using Kerecis to the wound bed. There is been improvement in healing. Unfortunately patient did not change the outer dressing for a week. There is slight maceration to the  Anesthetic (In clinic) Topical Lidocaine 5% applied to wound bed Bathing/ Shower/ Hygiene May shower with protection but do not get wound dressing(s) wet. Protect dressing(s) with water repellant cover (for example, large plastic bag) or a cast cover and may then take shower. Edema Control - Lymphedema / SCD / Other Elevate legs to the level of the heart or above for 30 minutes daily and/or when sitting for 3-4 times a day throughout the day. Avoid standing for long periods of time. Non Wound Condition Right Lower Extremity Other Non Wound Condition Orders/Instructions: - Right lateral foot -optifoam to protect non-wound Wound Treatment Wound #3 - Amputation Site - Toe Wound Laterality: Right Cleanser: Vashe 5.8 (oz) 1 x Per Day/15 Days Discharge Instructions: Cleanse the wound with Vashe prior to applying a clean dressing using gauze sponges, not tissue or cotton balls. Topical: Skintegrity Hydrogel 4 (oz) 1 x Per Day/15 Days Discharge Instructions: Apply hydrogel as directed Prim Dressing: Endoform 2x2 in (Generic) 1 x Per Day/15 Days ary Discharge Instructions: Moisten with saline Secondary Dressing: Woven Gauze Sponge, Non-Sterile 4x4 in (Generic) 1 x Per Day/15 Days Discharge Instructions: Apply over primary dressing as directed. Secured With: Paper Tape, 2x10 (in/yd) 1 x Per Day/15 Days Discharge Instructions: Secure dressing with tape as directed. Secured With: tubigrip size D 1 x Per Day/15 Days Discharge Instructions: if needed -double layer from toes to midfoot. Electronic Signature(s) Signed: 11/28/2022 3:06:38 PM By: Redmond Pulling RN, BSN Signed: 11/28/2022 3:56:56 PM By: [name] MD Entered By: Redmond Pulling on 11/28/2022  11:43:45 -------------------------------------------------------------------------------- Problem List Details Patient Name: Date of Service: [name] [name] 11/28/2022 1:45 PM Medical Record Number: [id number] Patient Account Number: [id number] Date of Birth/Sex: Treating RN: 05-Dec-1933 (89 y.o. M) Primary Care Provider: [name] Other Clinician: Referring Provider: Treating Provider/Extender: [name] in Treatment: [address], [place] ([id number]) [id number] Page 4 of 7 Active Problems ICD-10 Encounter Code Description Active Date MDM Diagnosis L97.512 Non-pressure chronic ulcer of other part of right foot with fat layer exposed 07/04/2022 No Yes E11.621 Type 2 diabetes mellitus with foot ulcer 07/04/2022 No Yes I70.235 Atherosclerosis of native arteries of right leg with ulceration of other part of 07/04/2022 No Yes foot I73.9 Peripheral vascular disease, unspecified 07/04/2022 No Yes Inactive Problems Resolved Problems Electronic Signature(s) Signed: 11/28/2022 3:56:56 PM By: [name] MD Entered By: [name] on 11/28/2022 11:33:36 -------------------------------------------------------------------------------- Progress Note Details Patient Name: Date of Service: [name] [name] 11/28/2022 1:45 PM Medical Record Number: [id number] Patient Account Number: [id number] Date of Birth/Sex: Treating RN: [birth year] (89 y.o. M) Primary Care Provider: [name] Other Clinician: Referring Provider: Treating Provider/Extender: [name] in Treatment: 21 Subjective History of Present Illness (HPI) 07/04/2022 Mr. Jabes Primo is an 89 year old male with a past medical history of uncontrolled, insulin dependent type 2 diabetes, CKD stage III, and COPD that presents to the clinic for a right foot wound. He has an extensive history of peripheral arterial disease to  the right leg. He has had a right superficial femoral to posterior tibial artery bypass by Dr. Elonda Husky in 2020. He had a right common femoral to plantar posterior tibial artery bypass on 02/03/2022 by Dr. Lenell Antu and at that time he also had a right toe amputation. He states this wound site had healed completely. Prior to this he had amputations to the first and second digit to the right foot. He presents today with an open wound to the previous right

## 2022-11-30 NOTE — Progress Notes (Signed)
N/A Wound Recurrence: 1.8x0.2x0.3 N/A N/A Measurements L x W x D (cm) 0.283 N/A N/A A (cm) : rea 0.085 N/A N/A Volume (cm) : 91.10% N/A N/A % Reduction in A rea: 91.10% N/A N/A % Reduction in Volume: Grade 1 N/A N/A Classification: Medium N/A N/A Exudate A mount: Serosanguineous N/A N/A Exudate Type: red, brown N/A N/A Exudate Color: Epibole N/A N/A Wound Margin: Large (67-100%) N/A N/A Granulation A mount: Red, Pink N/A N/A Granulation Quality: Small (1-33%) N/A N/A Necrotic A mount: Fat Layer (Subcutaneous Tissue): Yes N/A N/A Exposed Structures: Fascia: No Tendon: No Muscle: No Joint: No Bone: No Medium (34-66%) N/A N/A Epithelialization: Debridement - Selective/Open Wound N/A N/A Debridement: Pre-procedure Verification/Time Out 14:20 N/A N/A Taken: Lidocaine 4% Topical Solution N/A N/A Pain Control: Slough N/A N/A Tissue Debrided: Non-Viable Tissue N/A N/A Level: 0.28 N/A N/A Debridement A (sq cm): rea Curette N/A N/A Instrument: Minimum N/A N/A Bleeding: Pressure N/A N/A Hemostasis A  chieved: Procedure was tolerated well N/A N/A Debridement Treatment Response: 1.8x0.2x0.3 N/A N/A Post Debridement Measurements L x W x D (cm) 0.085 N/A N/A Post Debridement Volume: (cm) Excoriation: No N/A N/A Periwound Skin Texture: Induration: No Callus: No Crepitus: No Rash: No Scarring: No Maceration: Yes N/A N/A Periwound Skin Moisture: Dry/Scaly: No Atrophie Blanche: No N/A N/A Periwound Skin Color: Cyanosis: No Ecchymosis: No Erythema: No Hemosiderin Staining: No Mottled: No Pallor: No Rubor: No No Abnormality N/A N/A Temperature: Debridement N/A N/A Procedures Performed: Treatment Notes Electronic Signature(s) Signed: 11/28/2022 3:56:56 PM By: [name] MD Entered By: [name] on 11/28/2022 14:33:51 Brugh, Drewey ([id number]) [id number] Page 4 of 7 -------------------------------------------------------------------------------- Multi-Disciplinary Care Plan Details Patient Name: Date of Service: [name] 11/28/2022 1:45 PM Medical Record Number: [id number] Patient Account Number: [id number] Date of Birth/Sex: Treating RN: 1934/02/27 (89 y.o. [name] Primary Care Laconya Clere: [name] Other Clinician: Referring Pattricia Weiher: Treating Wendle Kina/Extender: [name] in Treatment: 21 Active Inactive Wound/Skin Impairment Nursing Diagnoses: Impaired tissue integrity Knowledge deficit related to ulceration/compromised skin integrity Goals: Patient will have a decrease in wound volume by X% from date: (specify in notes) Date Initiated: 07/04/2022 Target Resolution Date: 02/04/2023 Goal Status: Active Patient/caregiver will verbalize understanding of skin care regimen Date Initiated: 07/04/2022 Target Resolution Date: 02/04/2023 Goal Status: Active Ulcer/skin breakdown will have a volume reduction of 30% by week 4 Date Initiated: 07/04/2022 Date Inactivated:  10/27/2022 Target Resolution Date: 11/05/2022 Goal Status: Met Ulcer/skin breakdown will have a volume reduction of 50% by week 8 Date Initiated: 07/04/2022 Date Inactivated: 10/27/2022 Target Resolution Date: 11/05/2022 Goal Status: Met Interventions: Assess patient/caregiver ability to obtain necessary supplies Assess patient/caregiver ability to perform ulcer/skin care regimen upon admission and as needed Assess ulceration(s) every visit Notes: Electronic Signature(s) Signed: 11/28/2022 3:06:38 PM By: Redmond Pulling RN, BSN Entered By: Redmond Pulling on 11/28/2022 14:17:45 -------------------------------------------------------------------------------- Pain Assessment Details Patient Name: Date of Service: [name] 11/28/2022 1:45 PM Medical Record Number: [id number] Patient Account Number: [id number] Date of Birth/Sex: Treating RN: [birth year] (89 y.o. M) Primary Care Aster Eckrich: [name] Other Clinician: Referring Taelon Bendorf: Treating Devyn Sheerin/Extender: [name] in Treatment: 21 Active Problems Location of Pain Severity and Description of Pain Patient Has Paino No Site Locations [place], [place] ([id number]) [id number] Page 5 of 7 Pain Management and Medication Current Pain Management: Electronic Signature(s) Signed: 11/30/2022 4:28:55 PM By: Thayer Dallas Entered By: Thayer Dallas on 11/28/2022 14:05:31 -------------------------------------------------------------------------------- Patient/Caregiver Education Details Patient Name: Date of  [name], [name] ([id number]) [id number] Page 1 of 7 Visit Report for 11/28/2022 Arrival Information Details Patient Name: Date of Service: [name] 11/28/2022 1:45 PM Medical Record Number: [id number] Patient Account Number: [id number] Date of Birth/Sex: Treating RN: [birth year] (89 y.o. M) Primary Care Keshan Reha: [name] Other Clinician: Referring [name]: Treating Keyton [name]/Extender: [name] in Treatment: 21 Visit Information History Since Last Visit Added or deleted any medications: No Patient Arrived: Ambulatory Any new allergies or adverse reactions: No Arrival Time: 14:04 Had a fall or experienced change in No Accompanied By: daughter activities of daily living that may affect Transfer Assistance: None risk of falls: Patient Identification Verified: Yes Signs or symptoms of abuse/neglect since last visito No Secondary Verification Process Completed: Yes Hospitalized since last visit: No Patient Requires Transmission-Based Precautions: No Implantable device outside of the clinic excluding No Patient Has Alerts: Yes cellular tissue based products placed in the center Patient Alerts: Patient on Blood Thinner since last visit: Has Dressing in Place as Prescribed: Yes Pain Present Now: No Electronic Signature(s) Signed: 11/30/2022 4:28:55 PM By: Thayer Dallas Entered By: Thayer Dallas on 11/28/2022 14:05:07 -------------------------------------------------------------------------------- Encounter Discharge Information Details Patient Name: Date of Service: [name] 11/28/2022 1:45 PM Medical Record Number: [id number] Patient Account Number: [id number] Date of Birth/Sex: Treating RN: 1933-07-20 (89 y.o. [name] Primary Care Annalyse Langlais: [name] Other Clinician: Referring Benard Minturn: Treating Kashia Brossard/Extender: [name] in Treatment:  21 Encounter Discharge Information Items Post Procedure Vitals Discharge Condition: Stable Temperature (F): 97.7 Ambulatory Status: Ambulatory Pulse (bpm): 86 Discharge Destination: Home Respiratory Rate (breaths/min): 18 Transportation: Private Auto Blood Pressure (mmHg): 146/67 Accompanied By: daughter Schedule Follow-up Appointment: Yes Clinical Summary of Care: Patient Declined Electronic Signature(s) Signed: 11/28/2022 3:06:38 PM By: Redmond Pulling RN, BSN Entered By: Redmond Pulling on 11/28/2022 14:51:16 Navarro, Chalmers ([id number]) [id number] Page 2 of 7 -------------------------------------------------------------------------------- Lower Extremity Assessment Details Patient Name: Date of Service: [name] 11/28/2022 1:45 PM Medical Record Number: [id number] Patient Account Number: [id number] Date of Birth/Sex: Treating RN: 1933-05-30 (89 y.o. M) Primary Care Aquarius Tremper: [name] Other Clinician: Referring Tremont Gavitt: Treating Latana Colin/Extender: [name] in Treatment: 21 Edema Assessment Assessed: [name]: No] Franne Forts: No] Edema: [Left: N] [Right: o] Calf Left: Right: Point of Measurement: From Medial Instep 32.5 cm Ankle Left: Right: Point of Measurement: From Medial Instep 21 cm Vascular Assessment Extremity colors, hair growth, and conditions: Extremity Color: [Right:Normal] Hair Growth on Extremity: [Right:No] Temperature of Extremity: [Right:Warm] Capillary Refill: [Right:< 3 seconds] Dependent Rubor: [Right:No No] Electronic Signature(s) Signed: 11/30/2022 4:28:55 PM By: Thayer Dallas Entered By: Thayer Dallas on 11/28/2022 14:10:35 -------------------------------------------------------------------------------- Multi Wound Chart Details Patient Name: Date of Service: [name] 11/28/2022 1:45 PM Medical Record Number: [id number] Patient Account Number: [id number] Date  of Birth/Sex: Treating RN: [birth year] (89 y.o. M) Primary Care Brantlee Hinde: [name] Other Clinician: Referring Anyla Israelson: Treating Dionne Knoop/Extender: [name] in Treatment: 21 Vital Signs Height(in): 71 Pulse(bpm): 86 Weight(lbs): 170 Blood Pressure(mmHg): 146/67 Body Mass Index(BMI): 23.7 Temperature(F): 97.7 Respiratory Rate(breaths/min): 18 [3:Photos:] [N/A:N/A] Right Amputation Site - Toe N/A N/A Wound Location: Gradually Appeared N/A N/A Wounding Event: Diabetic Wound/Ulcer of the Lower N/A N/A Primary Etiology: Extremity Cataracts, Anemia, Asthma, Chronic N/A N/A Comorbid History: Obstructive Pulmonary Disease (COPD), Peripheral Arterial Disease, Type II Diabetes, Gout, Osteoarthritis, Neuropathy 04/07/2022 N/A N/A Date Acquired: 21 N/A N/A Weeks of Treatment: Open N/A N/A Wound Status: No N/A  N/A Wound Recurrence: 1.8x0.2x0.3 N/A N/A Measurements L x W x D (cm) 0.283 N/A N/A A (cm) : rea 0.085 N/A N/A Volume (cm) : 91.10% N/A N/A % Reduction in A rea: 91.10% N/A N/A % Reduction in Volume: Grade 1 N/A N/A Classification: Medium N/A N/A Exudate A mount: Serosanguineous N/A N/A Exudate Type: red, brown N/A N/A Exudate Color: Epibole N/A N/A Wound Margin: Large (67-100%) N/A N/A Granulation A mount: Red, Pink N/A N/A Granulation Quality: Small (1-33%) N/A N/A Necrotic A mount: Fat Layer (Subcutaneous Tissue): Yes N/A N/A Exposed Structures: Fascia: No Tendon: No Muscle: No Joint: No Bone: No Medium (34-66%) N/A N/A Epithelialization: Debridement - Selective/Open Wound N/A N/A Debridement: Pre-procedure Verification/Time Out 14:20 N/A N/A Taken: Lidocaine 4% Topical Solution N/A N/A Pain Control: Slough N/A N/A Tissue Debrided: Non-Viable Tissue N/A N/A Level: 0.28 N/A N/A Debridement A (sq cm): rea Curette N/A N/A Instrument: Minimum N/A N/A Bleeding: Pressure N/A N/A Hemostasis A  chieved: Procedure was tolerated well N/A N/A Debridement Treatment Response: 1.8x0.2x0.3 N/A N/A Post Debridement Measurements L x W x D (cm) 0.085 N/A N/A Post Debridement Volume: (cm) Excoriation: No N/A N/A Periwound Skin Texture: Induration: No Callus: No Crepitus: No Rash: No Scarring: No Maceration: Yes N/A N/A Periwound Skin Moisture: Dry/Scaly: No Atrophie Blanche: No N/A N/A Periwound Skin Color: Cyanosis: No Ecchymosis: No Erythema: No Hemosiderin Staining: No Mottled: No Pallor: No Rubor: No No Abnormality N/A N/A Temperature: Debridement N/A N/A Procedures Performed: Treatment Notes Electronic Signature(s) Signed: 11/28/2022 3:56:56 PM By: [name] MD Entered By: [name] on 11/28/2022 14:33:51 Brugh, Drewey ([id number]) [id number] Page 4 of 7 -------------------------------------------------------------------------------- Multi-Disciplinary Care Plan Details Patient Name: Date of Service: [name] 11/28/2022 1:45 PM Medical Record Number: [id number] Patient Account Number: [id number] Date of Birth/Sex: Treating RN: 1934/02/27 (89 y.o. [name] Primary Care Laconya Clere: [name] Other Clinician: Referring Pattricia Weiher: Treating Wendle Kina/Extender: [name] in Treatment: 21 Active Inactive Wound/Skin Impairment Nursing Diagnoses: Impaired tissue integrity Knowledge deficit related to ulceration/compromised skin integrity Goals: Patient will have a decrease in wound volume by X% from date: (specify in notes) Date Initiated: 07/04/2022 Target Resolution Date: 02/04/2023 Goal Status: Active Patient/caregiver will verbalize understanding of skin care regimen Date Initiated: 07/04/2022 Target Resolution Date: 02/04/2023 Goal Status: Active Ulcer/skin breakdown will have a volume reduction of 30% by week 4 Date Initiated: 07/04/2022 Date Inactivated:  10/27/2022 Target Resolution Date: 11/05/2022 Goal Status: Met Ulcer/skin breakdown will have a volume reduction of 50% by week 8 Date Initiated: 07/04/2022 Date Inactivated: 10/27/2022 Target Resolution Date: 11/05/2022 Goal Status: Met Interventions: Assess patient/caregiver ability to obtain necessary supplies Assess patient/caregiver ability to perform ulcer/skin care regimen upon admission and as needed Assess ulceration(s) every visit Notes: Electronic Signature(s) Signed: 11/28/2022 3:06:38 PM By: Redmond Pulling RN, BSN Entered By: Redmond Pulling on 11/28/2022 14:17:45 -------------------------------------------------------------------------------- Pain Assessment Details Patient Name: Date of Service: [name] 11/28/2022 1:45 PM Medical Record Number: [id number] Patient Account Number: [id number] Date of Birth/Sex: Treating RN: [birth year] (89 y.o. M) Primary Care Aster Eckrich: [name] Other Clinician: Referring Taelon Bendorf: Treating Devyn Sheerin/Extender: [name] in Treatment: 21 Active Problems Location of Pain Severity and Description of Pain Patient Has Paino No Site Locations [place], [place] ([id number]) [id number] Page 5 of 7 Pain Management and Medication Current Pain Management: Electronic Signature(s) Signed: 11/30/2022 4:28:55 PM By: Thayer Dallas Entered By: Thayer Dallas on 11/28/2022 14:05:31 -------------------------------------------------------------------------------- Patient/Caregiver Education Details Patient Name: Date of

## 2022-12-05 ENCOUNTER — Encounter (HOSPITAL_BASED_OUTPATIENT_CLINIC_OR_DEPARTMENT_OTHER): Payer: PPO | Admitting: Internal Medicine

## 2022-12-05 DIAGNOSIS — E1151 Type 2 diabetes mellitus with diabetic peripheral angiopathy without gangrene: Secondary | ICD-10-CM | POA: Diagnosis not present

## 2022-12-06 NOTE — Progress Notes (Signed)
Maceration: No Treatment Notes Wound #4 (Foot) Wound Laterality: Right, Lateral Cleanser Soap and Water Discharge Instruction: May shower and wash wound with dial antibacterial soap and water prior to dressing change. Wound Cleanser Discharge Instruction: Cleanse the wound with wound cleanser prior to applying a clean dressing using gauze sponges, not tissue or cotton balls. Peri-Wound Care Skin Prep Discharge Instruction: Use skin prep as directed Topical Primary Dressing Xeroform Occlusive Gauze Dressing, 4x4 in Discharge Instruction: Apply to wound bed as instructed Secondary Dressing Optifoam Non-Adhesive Dressing, 4x4 in Discharge Instruction: Apply over primary dressing or foam border as directed. HUMBLE, Taryll ([id number]) [id number] Page 10 of 10 Secured With Compression Wrap Compression Stockings Geologist, engineering) Signed: 12/05/2022 4:40:44 PM By: Shawn Stall RN, BSN Entered By: Shawn Stall on 12/05/2022 12:22:15 -------------------------------------------------------------------------------- Vitals Details Patient Name: Date of Service: [name] [name] 12/05/2022 3:00 PM Medical Record Number: [id number] Patient Account Number: [id number] Date of Birth/Sex: Treating RN: 10-23-1933 (89 y.o. [name] Primary Care Yitzhak Awan: [name] Other Clinician: Referring Davante Gerke: Treating Eisha Chatterjee/Extender: [name] in Treatment: 22 Vital Signs Time Taken: 15:01 Temperature (F): 97.8 Height (in): 71 Pulse (bpm): 79 Weight (lbs): 170 Respiratory Rate (breaths/min): 20 Body Mass Index (BMI): 23.7 Blood Pressure (mmHg): 169/80 Reference Range: 80 - 120 mg / dl Electronic Signature(s) Signed: 12/05/2022 4:40:44 PM By: Shawn Stall RN, BSN Entered By: Shawn Stall on 12/05/2022 12:01:54  Signed: 12/06/2022 7:25:57 AM By: [name] MD Entered By: [name] on 12/05/2022 12:28:32 Dimaggio, Lenord ([id number]) [id number] Page 5 of 10 -------------------------------------------------------------------------------- Multi-Disciplinary Care Plan Details Patient Name: Date of Service: [name] [name] 12/05/2022 3:00 PM Medical Record Number: [id number] Patient Account Number: [id number] Date of Birth/Sex: Treating RN: [birth year] (89 y.o. [name] Primary Care Jerimyah Vandunk: [name] Other Clinician: Referring  Azizi Bally: Treating Mckenna Boruff/Extender: [name] in Treatment: 22 Active Inactive Wound/Skin Impairment Nursing Diagnoses: Impaired tissue integrity Knowledge deficit related to ulceration/compromised skin integrity Goals: Patient will have a decrease in wound volume by X% from date: (specify in notes) Date Initiated: 07/04/2022 Target Resolution Date: 02/04/2023 Goal Status: Active Patient/caregiver will verbalize understanding of skin care regimen Date Initiated: 07/04/2022 Target Resolution Date: 02/04/2023 Goal Status: Active Ulcer/skin breakdown will have a volume reduction of 30% by week 4 Date Initiated: 07/04/2022 Date Inactivated: 10/27/2022 Target Resolution Date: 11/05/2022 Goal Status: Met Ulcer/skin breakdown will have a volume reduction of 50% by week 8 Date Initiated: 07/04/2022 Date Inactivated: 10/27/2022 Target Resolution Date: 11/05/2022 Goal Status: Met Interventions: Assess patient/caregiver ability to obtain necessary supplies Assess patient/caregiver ability to perform ulcer/skin care regimen upon admission and as needed Assess ulceration(s) every visit Notes: Electronic Signature(s) Signed: 12/05/2022 4:40:44 PM By: Shawn Stall RN, BSN Entered By: Shawn Stall on 12/05/2022 12:07:25 -------------------------------------------------------------------------------- Pain Assessment Details Patient Name: Date of Service: [name] [name] 12/05/2022 3:00 PM Medical Record Number: [id number] Patient Account Number: [id number] Date of Birth/Sex: Treating RN: 1933-08-26 (89 y.o. [name] Primary Care Riannah Stagner: [name] Other Clinician: Referring Wesson Stith: Treating Porschea Borys/Extender: [name] in Treatment: 22 Active Problems Location of Pain Severity and Description of Pain Patient Has Paino Yes Site Locations Pain LocationJaquell Seddon, [place] ([id number])  [id number] Page 6 of 10 Pain Location: Generalized Pain, Pain in Ulcers Rate the pain. Current Pain Level: 2 Pain Management and Medication Current Pain Management: Medication: No Cold Application: No Rest: No Massage: No Activity: No T.E.N.S.: No Heat Application: No Leg drop or elevation: No Is the Current Pain Management Adequate: Adequate How does your wound impact your activities of daily livingo Sleep: No Bathing: No Appetite: No Relationship With Others: No Bladder Continence: No Emotions: No Bowel Continence: No Work: No Toileting: No Drive: No Dressing: No Hobbies: No Psychologist, prison and probation services) Signed: 12/05/2022 4:40:44 PM By: Shawn Stall RN, BSN Entered By: Shawn Stall on 12/05/2022 12:02:05 -------------------------------------------------------------------------------- Patient/Caregiver Education Details Patient Name: Date of Service: [name] [name] 9/30/2024andnbsp3:00 PM Medical Record Number: [id number] Patient Account Number: [id number] Date of Birth/Gender: Treating RN: May 18, 1933 (89 y.o. [name] Primary Care Physician: [name] Other Clinician: Referring Physician: Treating Physician/Extender: [name] in Treatment: 22 Education Assessment Education Provided To: Patient Education Topics Provided Wound/Skin Impairment: Handouts: Caring for Your Ulcer Methods: Explain/Verbal Responses: Reinforcements needed Electronic Signature(s) Signed: 12/05/2022 4:40:44 PM By: Shawn Stall RN, BSN Entered By: Shawn Stall on 12/05/2022 12:07:41 Stuard, Wilborn ([id number]) [id number] Page 7 of 10 -------------------------------------------------------------------------------- Wound Assessment Details Patient Name: Date of Service: [name] [name] 12/05/2022 3:00 PM Medical Record Number: [id number] Patient Account Number: [id number] Date  of Birth/Sex: Treating RN: [birth year] (89 y.o. [name] Primary Care Leslie Langille: [name] Other Clinician: Referring Michaline Kindig: Treating Tekeshia Klahr/Extender: [name] in Treatment: 22 Wound Status Wound Number: 3 Primary Diabetic Wound/Ulcer of the Lower Extremity Etiology: Wound Location: Right Amputation Site - Toe Wound Open  Maceration: No Treatment Notes Wound #4 (Foot) Wound Laterality: Right, Lateral Cleanser Soap and Water Discharge Instruction: May shower and wash wound with dial antibacterial soap and water prior to dressing change. Wound Cleanser Discharge Instruction: Cleanse the wound with wound cleanser prior to applying a clean dressing using gauze sponges, not tissue or cotton balls. Peri-Wound Care Skin Prep Discharge Instruction: Use skin prep as directed Topical Primary Dressing Xeroform Occlusive Gauze Dressing, 4x4 in Discharge Instruction: Apply to wound bed as instructed Secondary Dressing Optifoam Non-Adhesive Dressing, 4x4 in Discharge Instruction: Apply over primary dressing or foam border as directed. HUMBLE, Taryll ([id number]) [id number] Page 10 of 10 Secured With Compression Wrap Compression Stockings Geologist, engineering) Signed: 12/05/2022 4:40:44 PM By: Shawn Stall RN, BSN Entered By: Shawn Stall on 12/05/2022 12:22:15 -------------------------------------------------------------------------------- Vitals Details Patient Name: Date of Service: [name] [name] 12/05/2022 3:00 PM Medical Record Number: [id number] Patient Account Number: [id number] Date of Birth/Sex: Treating RN: 10-23-1933 (89 y.o. [name] Primary Care Yitzhak Awan: [name] Other Clinician: Referring Davante Gerke: Treating Eisha Chatterjee/Extender: [name] in Treatment: 22 Vital Signs Time Taken: 15:01 Temperature (F): 97.8 Height (in): 71 Pulse (bpm): 79 Weight (lbs): 170 Respiratory Rate (breaths/min): 20 Body Mass Index (BMI): 23.7 Blood Pressure (mmHg): 169/80 Reference Range: 80 - 120 mg / dl Electronic Signature(s) Signed: 12/05/2022 4:40:44 PM By: Shawn Stall RN, BSN Entered By: Shawn Stall on 12/05/2022 12:01:54  Signed: 12/06/2022 7:25:57 AM By: [name] MD Entered By: [name] on 12/05/2022 12:28:32 Dimaggio, Lenord ([id number]) [id number] Page 5 of 10 -------------------------------------------------------------------------------- Multi-Disciplinary Care Plan Details Patient Name: Date of Service: [name] [name] 12/05/2022 3:00 PM Medical Record Number: [id number] Patient Account Number: [id number] Date of Birth/Sex: Treating RN: [birth year] (89 y.o. [name] Primary Care Jerimyah Vandunk: [name] Other Clinician: Referring  Azizi Bally: Treating Mckenna Boruff/Extender: [name] in Treatment: 22 Active Inactive Wound/Skin Impairment Nursing Diagnoses: Impaired tissue integrity Knowledge deficit related to ulceration/compromised skin integrity Goals: Patient will have a decrease in wound volume by X% from date: (specify in notes) Date Initiated: 07/04/2022 Target Resolution Date: 02/04/2023 Goal Status: Active Patient/caregiver will verbalize understanding of skin care regimen Date Initiated: 07/04/2022 Target Resolution Date: 02/04/2023 Goal Status: Active Ulcer/skin breakdown will have a volume reduction of 30% by week 4 Date Initiated: 07/04/2022 Date Inactivated: 10/27/2022 Target Resolution Date: 11/05/2022 Goal Status: Met Ulcer/skin breakdown will have a volume reduction of 50% by week 8 Date Initiated: 07/04/2022 Date Inactivated: 10/27/2022 Target Resolution Date: 11/05/2022 Goal Status: Met Interventions: Assess patient/caregiver ability to obtain necessary supplies Assess patient/caregiver ability to perform ulcer/skin care regimen upon admission and as needed Assess ulceration(s) every visit Notes: Electronic Signature(s) Signed: 12/05/2022 4:40:44 PM By: Shawn Stall RN, BSN Entered By: Shawn Stall on 12/05/2022 12:07:25 -------------------------------------------------------------------------------- Pain Assessment Details Patient Name: Date of Service: [name] [name] 12/05/2022 3:00 PM Medical Record Number: [id number] Patient Account Number: [id number] Date of Birth/Sex: Treating RN: 1933-08-26 (89 y.o. [name] Primary Care Riannah Stagner: [name] Other Clinician: Referring Wesson Stith: Treating Porschea Borys/Extender: [name] in Treatment: 22 Active Problems Location of Pain Severity and Description of Pain Patient Has Paino Yes Site Locations Pain LocationJaquell Seddon, [place] ([id number])  [id number] Page 6 of 10 Pain Location: Generalized Pain, Pain in Ulcers Rate the pain. Current Pain Level: 2 Pain Management and Medication Current Pain Management: Medication: No Cold Application: No Rest: No Massage: No Activity: No T.E.N.S.: No Heat Application: No Leg drop or elevation: No Is the Current Pain Management Adequate: Adequate How does your wound impact your activities of daily livingo Sleep: No Bathing: No Appetite: No Relationship With Others: No Bladder Continence: No Emotions: No Bowel Continence: No Work: No Toileting: No Drive: No Dressing: No Hobbies: No Psychologist, prison and probation services) Signed: 12/05/2022 4:40:44 PM By: Shawn Stall RN, BSN Entered By: Shawn Stall on 12/05/2022 12:02:05 -------------------------------------------------------------------------------- Patient/Caregiver Education Details Patient Name: Date of Service: [name] [name] 9/30/2024andnbsp3:00 PM Medical Record Number: [id number] Patient Account Number: [id number] Date of Birth/Gender: Treating RN: May 18, 1933 (89 y.o. [name] Primary Care Physician: [name] Other Clinician: Referring Physician: Treating Physician/Extender: [name] in Treatment: 22 Education Assessment Education Provided To: Patient Education Topics Provided Wound/Skin Impairment: Handouts: Caring for Your Ulcer Methods: Explain/Verbal Responses: Reinforcements needed Electronic Signature(s) Signed: 12/05/2022 4:40:44 PM By: Shawn Stall RN, BSN Entered By: Shawn Stall on 12/05/2022 12:07:41 Stuard, Wilborn ([id number]) [id number] Page 7 of 10 -------------------------------------------------------------------------------- Wound Assessment Details Patient Name: Date of Service: [name] [name] 12/05/2022 3:00 PM Medical Record Number: [id number] Patient Account Number: [id number] Date  of Birth/Sex: Treating RN: [birth year] (89 y.o. [name] Primary Care Leslie Langille: [name] Other Clinician: Referring Michaline Kindig: Treating Tekeshia Klahr/Extender: [name] in Treatment: 22 Wound Status Wound Number: 3 Primary Diabetic Wound/Ulcer of the Lower Extremity Etiology: Wound Location: Right Amputation Site - Toe Wound Open  Maceration: No Treatment Notes Wound #4 (Foot) Wound Laterality: Right, Lateral Cleanser Soap and Water Discharge Instruction: May shower and wash wound with dial antibacterial soap and water prior to dressing change. Wound Cleanser Discharge Instruction: Cleanse the wound with wound cleanser prior to applying a clean dressing using gauze sponges, not tissue or cotton balls. Peri-Wound Care Skin Prep Discharge Instruction: Use skin prep as directed Topical Primary Dressing Xeroform Occlusive Gauze Dressing, 4x4 in Discharge Instruction: Apply to wound bed as instructed Secondary Dressing Optifoam Non-Adhesive Dressing, 4x4 in Discharge Instruction: Apply over primary dressing or foam border as directed. HUMBLE, Taryll ([id number]) [id number] Page 10 of 10 Secured With Compression Wrap Compression Stockings Geologist, engineering) Signed: 12/05/2022 4:40:44 PM By: Shawn Stall RN, BSN Entered By: Shawn Stall on 12/05/2022 12:22:15 -------------------------------------------------------------------------------- Vitals Details Patient Name: Date of Service: [name] [name] 12/05/2022 3:00 PM Medical Record Number: [id number] Patient Account Number: [id number] Date of Birth/Sex: Treating RN: 10-23-1933 (89 y.o. [name] Primary Care Yitzhak Awan: [name] Other Clinician: Referring Davante Gerke: Treating Eisha Chatterjee/Extender: [name] in Treatment: 22 Vital Signs Time Taken: 15:01 Temperature (F): 97.8 Height (in): 71 Pulse (bpm): 79 Weight (lbs): 170 Respiratory Rate (breaths/min): 20 Body Mass Index (BMI): 23.7 Blood Pressure (mmHg): 169/80 Reference Range: 80 - 120 mg / dl Electronic Signature(s) Signed: 12/05/2022 4:40:44 PM By: Shawn Stall RN, BSN Entered By: Shawn Stall on 12/05/2022 12:01:54

## 2022-12-06 NOTE — Progress Notes (Signed)
There is been improvement in healing. Unfortunately patient did not change the outer dressing for a week. There is slight maceration to the periwound. Unclear why he did not do this. 5/30; patient presents for follow-up. We have been using Kerecis to the wound bed. More maceration to the periwound today. No signs of infection. 6/10; patient presents for follow-up. He has been using Hydrofera Blue to the wound bed. He has no issues or complaints today. Maceration has almost completely resolved. 6/18; patient presents for follow-up. He has been using blast X and collagen to the wound bed without issues. Wound appears smaller today. 6/28; patient presents for follow-up. He has been using blast X and collagen to the wound bed. The wound is smaller. 7/11; patient presents for follow-up. He has been using blast X and collagen to the wound bed. Wound is smaller. He has slough accumulation. 7/25; patient presents for follow-up. He has been using blast X and collagen to the wound bed. Wound is stable. He denies signs of infection. 8/26; patient presents for follow-up. He has been using blast X and collagen to the wound bed. Wound is slightly smaller. He has no issues or complaints. 8/29; patient presents for follow-up. He has been using blast X and Aquacel Ag to the wound bed. He has been using a Velcro wrap at his toes to keep the toes closer together to help with wound healing. He has no issues or complaints today. Wound is smaller. 9/12; patient presents for follow-up. He has been using blast X and Aquacel Ag to the wound bed. Unfortunately he has developed a pressure injury to the lateral aspect of the foot. He is not sure how this started. No open wound there. He denies  pain. 9/23; this patient has a wound on the right foot at the amputation site of her third toe. It is right up against the fourth toe making it somewhat difficult to dress. When he was here last time he had a blister/DTI on the lateral aspect of the fifth MTP 9/30; patient has a wound on the right foot amputation site of his third toe. Probably in the third fourth webspace. I changed him to endoform last week. The wound looks somewhat better The area over the lateral part of the fifth metatarsal head which was blistered with some central deep tissue looking injury was leaking fluid today. The blister was unroofed. I used pickups and scissors to remove this. I do not know that there is anything technically open here although there is certainly some central tissue at risk Electronic Signature(s) Signed: 12/06/2022 7:25:57 AM By: [name] MD Entered By: [name] on 12/05/2022 15:30:35 -------------------------------------------------------------------------------- Physical Exam Details Patient Name: Date of Service: [name] 12/05/2022 3:00 PM Medical Record Number: [id number] Patient Account Number: [id number] Date of Birth/Sex: Treating RN: 1933/03/22 (89 y.o. M) Primary Care Provider: [name] Other Clinician: Referring Provider: Treating Provider/Extender: [name] in Treatment: 22 Constitutional Patient is hypertensive.. Pulse regular and within target range for patient.Marland Kitchen Respirations regular, non-labored and within target range.. Temperature is normal and within the target range for the patient.Marland Kitchen Appears in no distress. Notes Wound exam; third toe amputation site. This wound actually looks some better. There is still some central depth here however the surrounding granulation looks better. The blister on the right lateral fifth metatarsal head was unroofed with pickups and scissors. The central part of this looks as though  there is some deeper type injury but technically  NIES, [name] ([id number]) [id number] Page 1 of 7 Visit Report for 12/05/2022 Debridement Details Patient Name: Date of Service: [name] 12/05/2022 3:00 PM Medical Record Number: [id number] Patient Account Number: [id number] Date of Birth/Sex: Treating RN: Jul 29, 1933 (89 y.o. M) Primary Care Provider: [name] Other Clinician: Referring Provider: Treating Provider/Extender: [name] in Treatment: 22 Debridement Performed for Assessment: Wound #4 Right,Lateral Foot Performed By: Physician Maxwell Caul., MD The following information was scribed by: Shawn Stall The information was scribed for: [name] Debridement Type: Debridement Severity of Tissue Pre Debridement: Fat layer exposed Level of Consciousness (Pre-procedure): Awake and Alert Pre-procedure Verification/Time Out Yes - 15:10 Taken: Start Time: 15:11 Pain Control: Lidocaine 4% T opical Solution Percent of Wound Bed Debrided: 100% T Area Debrided (cm): otal 5.18 Tissue and other material debrided: Viable, Non-Viable, Skin: Dermis , Skin: Epidermis Level: Skin/Epidermis Debridement Description: Selective/Open Wound Instrument: Forceps, Scissors Bleeding: None End Time: 15:16 Procedural Pain: 0 Post Procedural Pain: 0 Response to Treatment: Procedure was tolerated well Level of Consciousness (Post- Awake and Alert procedure): Post Debridement Measurements of Total Wound Length: (cm) 3 Width: (cm) 2.2 Depth: (cm) 0.1 Volume: (cm) 0.518 Character of Wound/Ulcer Post Debridement: Improved Severity of Tissue Post Debridement: Fat layer exposed Post Procedure Diagnosis Same as Pre-procedure Electronic Signature(s) Signed: 12/06/2022 7:25:57 AM By: [name] MD Entered By: [name] on 12/05/2022 15:28:52 -------------------------------------------------------------------------------- HPI Details Patient Name:  Date of Service: [name] 12/05/2022 3:00 PM Medical Record Number: [id number] Patient Account Number: [id number] Date of Birth/Sex: Treating RN: 11/11/1933 (89 y.o. M) Primary Care Provider: [name] Other Clinician: Referring Provider: Treating Provider/Extender: [name] in Treatment: [address] [name] ([id number]) [id number] Page 2 of 7 History of Present Illness HPI Description: 07/04/2022 [name] is an 89 year old male with a past medical history of uncontrolled, insulin dependent type 2 diabetes, CKD stage III, and COPD that presents to the clinic for a right foot wound. He has an extensive history of peripheral arterial disease to the right leg. He has had a right superficial femoral to posterior tibial artery bypass by Dr. Elonda Husky in 2020. He had a right common femoral to plantar posterior tibial artery bypass on 02/03/2022 by Dr. Lenell Antu and at that time he also had a right toe amputation. He states this wound site had healed completely. Prior to this he had amputations to the first and second digit to the right foot. He presents today with an open wound to the previous right toe amputation site for the past 2 months. He has been following with vein and vascular for this issue. Most recent ABIs were on 06/22/2022 that showed absent waveforms in the right lower extremity. Currently his right lower extremity bypass is occluded and no plan for another anteriorgram and has he likely has no revascularization options. Currently he has been using Betadine to the wound bed. He denies signs of infection. 07/11/2022 patient arrives in clinic today using Hydrofera Blue on the toe amputation site. The patient is revascularized and is not felt to have any other options for revascularization. We are using Hydrofera Blue and gauze. 5/13; patient presents for follow-up. He has been using Hydrofera Blue to the  toe amputation site. He has had no issues. Wound is smaller. He has been approved for [facility] and was agreeable to have this placed in office today. 5/20; patient presents for follow-up. We have been using Kerecis to the wound bed.  There is been improvement in healing. Unfortunately patient did not change the outer dressing for a week. There is slight maceration to the periwound. Unclear why he did not do this. 5/30; patient presents for follow-up. We have been using Kerecis to the wound bed. More maceration to the periwound today. No signs of infection. 6/10; patient presents for follow-up. He has been using Hydrofera Blue to the wound bed. He has no issues or complaints today. Maceration has almost completely resolved. 6/18; patient presents for follow-up. He has been using blast X and collagen to the wound bed without issues. Wound appears smaller today. 6/28; patient presents for follow-up. He has been using blast X and collagen to the wound bed. The wound is smaller. 7/11; patient presents for follow-up. He has been using blast X and collagen to the wound bed. Wound is smaller. He has slough accumulation. 7/25; patient presents for follow-up. He has been using blast X and collagen to the wound bed. Wound is stable. He denies signs of infection. 8/26; patient presents for follow-up. He has been using blast X and collagen to the wound bed. Wound is slightly smaller. He has no issues or complaints. 8/29; patient presents for follow-up. He has been using blast X and Aquacel Ag to the wound bed. He has been using a Velcro wrap at his toes to keep the toes closer together to help with wound healing. He has no issues or complaints today. Wound is smaller. 9/12; patient presents for follow-up. He has been using blast X and Aquacel Ag to the wound bed. Unfortunately he has developed a pressure injury to the lateral aspect of the foot. He is not sure how this started. No open wound there. He denies  pain. 9/23; this patient has a wound on the right foot at the amputation site of her third toe. It is right up against the fourth toe making it somewhat difficult to dress. When he was here last time he had a blister/DTI on the lateral aspect of the fifth MTP 9/30; patient has a wound on the right foot amputation site of his third toe. Probably in the third fourth webspace. I changed him to endoform last week. The wound looks somewhat better The area over the lateral part of the fifth metatarsal head which was blistered with some central deep tissue looking injury was leaking fluid today. The blister was unroofed. I used pickups and scissors to remove this. I do not know that there is anything technically open here although there is certainly some central tissue at risk Electronic Signature(s) Signed: 12/06/2022 7:25:57 AM By: [name] MD Entered By: [name] on 12/05/2022 15:30:35 -------------------------------------------------------------------------------- Physical Exam Details Patient Name: Date of Service: [name] 12/05/2022 3:00 PM Medical Record Number: [id number] Patient Account Number: [id number] Date of Birth/Sex: Treating RN: 1933/03/22 (89 y.o. M) Primary Care Provider: [name] Other Clinician: Referring Provider: Treating Provider/Extender: [name] in Treatment: 22 Constitutional Patient is hypertensive.. Pulse regular and within target range for patient.Marland Kitchen Respirations regular, non-labored and within target range.. Temperature is normal and within the target range for the patient.Marland Kitchen Appears in no distress. Notes Wound exam; third toe amputation site. This wound actually looks some better. There is still some central depth here however the surrounding granulation looks better. The blister on the right lateral fifth metatarsal head was unroofed with pickups and scissors. The central part of this looks as though  there is some deeper type injury but technically  no open area. [name], [name] ([id number]) [id number] Page 3 of 7 Electronic Signature(s) Signed: 12/06/2022 7:25:57 AM By: [name] MD Entered By: [name] on 12/05/2022 15:32:04 -------------------------------------------------------------------------------- Physician Orders Details Patient Name: Date of Service: [name] 12/05/2022 3:00 PM Medical Record Number: [id number] Patient Account Number: [id number] Date of Birth/Sex: Treating RN: 1933/11/05 (89 y.o. [name] Primary Care Provider: [name] Other Clinician: Referring Provider: Treating Provider/Extender: [name] in Treatment: 22 The following information was scribed by: Shawn Stall The information was scribed for: [name] Verbal / Phone Orders: No Diagnosis Coding ICD-10 Coding Code Description L97.512 Non-pressure chronic ulcer of other part of right foot with fat layer exposed E11.621 Type 2 diabetes mellitus with foot ulcer I70.235 Atherosclerosis of native arteries of right leg with ulceration of other part of foot I73.9 Peripheral vascular disease, unspecified Follow-up Appointments ppointment in 1 week. - Dr. Leanord Hawking 12/12/2022 2pm Return A ppointment in 2 weeks. - Dr. Leanord Hawking Room 9 Please schedule with front desk Return A Return appointment in 3 weeks. - Dr. Leanord Hawking Room 9 Please schedule with front desk Return appointment in 1 month. - Dr. Leanord Hawking Please schedule with front desk Anesthetic (In clinic) Topical Lidocaine 5% applied to wound bed Bathing/ Shower/ Hygiene May shower with protection but do not get wound dressing(s) wet. Protect dressing(s) with water repellant cover (for example, large plastic bag) or a cast cover and may then take shower. Edema Control - Lymphedema / SCD / Other Elevate legs to the level of the heart or above for 30 minutes  daily and/or when sitting for 3-4 times a day throughout the day. Avoid standing for long periods of time. Non Wound Condition Right Lower Extremity Other Non Wound Condition Orders/Instructions: - Right lateral foot -optifoam to protect non-wound Wound Treatment Wound #3 - Amputation Site - Toe Wound Laterality: Right Cleanser: Vashe 5.8 (oz) Every Other Day/15 Days Discharge Instructions: Cleanse the wound with Vashe prior to applying a clean dressing using gauze sponges, not tissue or cotton balls. Topical: Skintegrity Hydrogel 4 (oz) Every Other Day/15 Days Discharge Instructions: Apply hydrogel as directed Prim Dressing: Endoform 2x2 in (Generic) Every Other Day/15 Days ary Discharge Instructions: Moisten with saline Secondary Dressing: Woven Gauze Sponge, Non-Sterile 4x4 in (Generic) Every Other Day/15 Days Discharge Instructions: Apply over primary dressing as directed. Secured With: Paper Tape, 2x10 (in/yd) Every Other Day/15 Days Discharge Instructions: Secure dressing with tape as directed. Secured With: tubigrip size D Every Other Day/15 Days Discharge Instructions: if needed -double layer from toes to midfoot. [name], [name] ([id number]) [id number] Page 4 of 7 Wound #4 - Foot Wound Laterality: Right, Lateral Cleanser: Soap and Water Every Other Day/15 Days Discharge Instructions: May shower and wash wound with dial antibacterial soap and water prior to dressing change. Cleanser: Wound Cleanser Every Other Day/15 Days Discharge Instructions: Cleanse the wound with wound cleanser prior to applying a clean dressing using gauze sponges, not tissue or cotton balls. Peri-Wound Care: Skin Prep Every Other Day/15 Days Discharge Instructions: Use skin prep as directed Prim Dressing: Xeroform Occlusive Gauze Dressing, 4x4 in Every Other Day/15 Days ary Discharge Instructions: Apply to wound bed as instructed Secondary Dressing: Optifoam Non-Adhesive  Dressing, 4x4 in Every Other Day/15 Days Discharge Instructions: Apply over primary dressing or foam border as directed. Electronic Signature(s) Signed: 12/05/2022 4:40:44 PM By: Shawn Stall RN, BSN Signed: 12/06/2022 7:25:57 AM By: [name] MD Entered By: Shawn Stall on 12/05/2022 15:17:50 -------------------------------------------------------------------------------- Problem List  Day/15 Days Discharge Instructions: if needed -double layer from toes to midfoot. WOUND #4: - Foot Wound Laterality: Right, Lateral Cleanser: Soap and Water Every Other Day/15 Days Discharge Instructions: May shower and wash wound with dial antibacterial soap and water prior to dressing change. [name], [name] ([id number]) [id number] Page 7 of 7 Cleanser: Wound Cleanser Every Other Day/15 Days Discharge Instructions: Cleanse the wound with wound cleanser prior to applying a clean dressing using gauze sponges, not tissue or cotton balls. Peri-Wound Care: Skin Prep Every Other Day/15 Days Discharge Instructions: Use skin prep as directed Prim Dressing: Xeroform Occlusive Gauze Dressing, 4x4 in Every Other Day/15 Days ary Discharge Instructions: Apply to wound bed as instructed Secondary Dressing: Optifoam Non-Adhesive Dressing, 4x4 in Every Other Day/15 Days Discharge Instructions: Apply over primary dressing or foam border as directed. 1. We continued the endoform to the right third toe amputation site wound this looks somewhat better today 2. The area over the fifth metatarsal head at the blister/superficial epithelium removed. In the center part of the wound is some tissue necrosis but everything looks covered 3. He has known PAD 4. No clear infection 5. Xeroform as the primary dressing to the lateral fifth metatarsal head. Careful pressure relief and a healing sandal to this area Electronic Signature(s) Signed: 12/06/2022 7:25:57 AM By: [name] MD Entered By: [name] on 12/05/2022 15:33:31 -------------------------------------------------------------------------------- SuperBill  Details Patient Name: Date of Service: [name] 12/05/2022 Medical Record Number: [id number] Patient Account Number: [id number] Date of Birth/Sex: Treating RN: March 04, 1934 (89 y.o. [name], [name] Primary Care Provider: [name] Other Clinician: Referring Provider: Treating Provider/Extender: [name] in Treatment: 22 Diagnosis Coding ICD-10 Codes Code Description [phone] Non-pressure chronic ulcer of other part of right foot with fat layer exposed E11.621 Type 2 diabetes mellitus with foot ulcer I70.235 Atherosclerosis of native arteries of right leg with ulceration of other part of foot I73.9 Peripheral vascular disease, unspecified Facility Procedures : CPT4 Code: [id number] Description: 97597 - DEBRIDE WOUND 1ST 20 SQ CM OR < ICD-10 Diagnosis Description L97.512 Non-pressure chronic ulcer of other part of right foot with fat layer exposed Modifier: Quantity: 1 Physician Procedures : CPT4 Code Description Modifier [id number] 97597 - WC PHYS DEBR WO ANESTH 20 SQ CM ICD-10 Diagnosis Description L97.512 Non-pressure chronic ulcer of other part of right foot with fat layer exposed Quantity: 1 Electronic Signature(s) Signed: 12/06/2022 7:25:57 AM By: [name] MD Entered By: [name] on 12/05/2022 15:33:43  Details Patient Name: Date of Service: [name] 12/05/2022 3:00 PM Medical Record Number: [id number] Patient Account Number: [id number] Date of Birth/Sex: Treating RN: 09/07/1933 (89 y.o. [name] Primary Care Provider: [name] Other Clinician: Referring Provider: Treating Provider/Extender: [name] in Treatment: 22 Active Problems ICD-10 Encounter Code Description Active Date MDM Diagnosis [phone] Non-pressure chronic ulcer of other part of right foot with fat layer exposed 07/04/2022 No Yes E11.621 Type 2 diabetes mellitus with foot ulcer 07/04/2022 No Yes I70.235 Atherosclerosis of native arteries of right leg with ulceration of other part of 07/04/2022 No Yes foot I73.9 Peripheral vascular disease, unspecified 07/04/2022 No Yes Inactive Problems Resolved Problems Electronic Signature(s) Signed: 12/06/2022 7:25:57 AM By: [name] MD Entered By: [name] on 12/05/2022 15:28:21 [name], [name] ([id number]) [id number] Page 5 of 7 -------------------------------------------------------------------------------- Progress Note Details Patient Name: Date of Service: [name] 12/05/2022 3:00 PM Medical Record Number: [id number] Patient Account Number: [id number] Date of Birth/Sex: Treating RN: 11/08/1933 (89 y.o. M) Primary Care Provider: [name] Other Clinician: Referring Provider: Treating Provider/Extender: [name] in Treatment:  22 Subjective History of Present Illness (HPI) 07/04/2022 Mr. Alondra Sahni is an 89 year old male with a past medical history of uncontrolled, insulin dependent type 2 diabetes, CKD stage III, and COPD that presents to the clinic for a right foot wound. He has an extensive history of peripheral arterial disease to the right leg. He has had a right superficial femoral to posterior tibial artery bypass by Dr. Elonda Husky in 2020. He had a right common femoral to plantar posterior tibial artery bypass on 02/03/2022 by Dr. Lenell Antu and at that time he also had a right toe amputation. He states this wound site had healed completely. Prior to this he had amputations to the first and second digit to the right foot. He presents today with an open wound to the previous right toe amputation site for the past 2 months. He has been following with vein and vascular for this issue. Most recent ABIs were on 06/22/2022 that showed absent waveforms in the right lower extremity. Currently his right lower extremity bypass is occluded and no plan for another anteriorgram and has he likely has no revascularization options. Currently he has been using Betadine to the wound bed. He denies signs of infection. 07/11/2022 patient arrives in clinic today using Hydrofera Blue on the toe amputation site. The patient is revascularized and is not felt to have any other options for revascularization. We are using Hydrofera Blue and gauze. 5/13; patient presents for follow-up. He has been using Hydrofera Blue to the toe amputation site. He has had no issues. Wound is smaller. He has been approved for [facility] and was agreeable to have this placed in office today. 5/20; patient presents for follow-up. We have been using Kerecis to the wound bed. There is been improvement in healing. Unfortunately patient did not change the outer dressing for a week. There is slight maceration to the periwound. Unclear why he did not do this. 5/30;  patient presents for follow-up. We have been using Kerecis to the wound bed. More maceration to the periwound today. No signs of infection. 6/10; patient presents for follow-up. He has been using Hydrofera Blue to the wound bed. He has no issues or complaints today. Maceration has almost completely resolved. 6/18; patient presents for follow-up. He has been using blast X and collagen to the wound bed without issues. Wound appears smaller today.  no open area. [name], [name] ([id number]) [id number] Page 3 of 7 Electronic Signature(s) Signed: 12/06/2022 7:25:57 AM By: [name] MD Entered By: [name] on 12/05/2022 15:32:04 -------------------------------------------------------------------------------- Physician Orders Details Patient Name: Date of Service: [name] 12/05/2022 3:00 PM Medical Record Number: [id number] Patient Account Number: [id number] Date of Birth/Sex: Treating RN: 1933/11/05 (89 y.o. [name] Primary Care Provider: [name] Other Clinician: Referring Provider: Treating Provider/Extender: [name] in Treatment: 22 The following information was scribed by: Shawn Stall The information was scribed for: [name] Verbal / Phone Orders: No Diagnosis Coding ICD-10 Coding Code Description L97.512 Non-pressure chronic ulcer of other part of right foot with fat layer exposed E11.621 Type 2 diabetes mellitus with foot ulcer I70.235 Atherosclerosis of native arteries of right leg with ulceration of other part of foot I73.9 Peripheral vascular disease, unspecified Follow-up Appointments ppointment in 1 week. - Dr. Leanord Hawking 12/12/2022 2pm Return A ppointment in 2 weeks. - Dr. Leanord Hawking Room 9 Please schedule with front desk Return A Return appointment in 3 weeks. - Dr. Leanord Hawking Room 9 Please schedule with front desk Return appointment in 1 month. - Dr. Leanord Hawking Please schedule with front desk Anesthetic (In clinic) Topical Lidocaine 5% applied to wound bed Bathing/ Shower/ Hygiene May shower with protection but do not get wound dressing(s) wet. Protect dressing(s) with water repellant cover (for example, large plastic bag) or a cast cover and may then take shower. Edema Control - Lymphedema / SCD / Other Elevate legs to the level of the heart or above for 30 minutes  daily and/or when sitting for 3-4 times a day throughout the day. Avoid standing for long periods of time. Non Wound Condition Right Lower Extremity Other Non Wound Condition Orders/Instructions: - Right lateral foot -optifoam to protect non-wound Wound Treatment Wound #3 - Amputation Site - Toe Wound Laterality: Right Cleanser: Vashe 5.8 (oz) Every Other Day/15 Days Discharge Instructions: Cleanse the wound with Vashe prior to applying a clean dressing using gauze sponges, not tissue or cotton balls. Topical: Skintegrity Hydrogel 4 (oz) Every Other Day/15 Days Discharge Instructions: Apply hydrogel as directed Prim Dressing: Endoform 2x2 in (Generic) Every Other Day/15 Days ary Discharge Instructions: Moisten with saline Secondary Dressing: Woven Gauze Sponge, Non-Sterile 4x4 in (Generic) Every Other Day/15 Days Discharge Instructions: Apply over primary dressing as directed. Secured With: Paper Tape, 2x10 (in/yd) Every Other Day/15 Days Discharge Instructions: Secure dressing with tape as directed. Secured With: tubigrip size D Every Other Day/15 Days Discharge Instructions: if needed -double layer from toes to midfoot. [name], [name] ([id number]) [id number] Page 4 of 7 Wound #4 - Foot Wound Laterality: Right, Lateral Cleanser: Soap and Water Every Other Day/15 Days Discharge Instructions: May shower and wash wound with dial antibacterial soap and water prior to dressing change. Cleanser: Wound Cleanser Every Other Day/15 Days Discharge Instructions: Cleanse the wound with wound cleanser prior to applying a clean dressing using gauze sponges, not tissue or cotton balls. Peri-Wound Care: Skin Prep Every Other Day/15 Days Discharge Instructions: Use skin prep as directed Prim Dressing: Xeroform Occlusive Gauze Dressing, 4x4 in Every Other Day/15 Days ary Discharge Instructions: Apply to wound bed as instructed Secondary Dressing: Optifoam Non-Adhesive  Dressing, 4x4 in Every Other Day/15 Days Discharge Instructions: Apply over primary dressing or foam border as directed. Electronic Signature(s) Signed: 12/05/2022 4:40:44 PM By: Shawn Stall RN, BSN Signed: 12/06/2022 7:25:57 AM By: [name] MD Entered By: Shawn Stall on 12/05/2022 15:17:50 -------------------------------------------------------------------------------- Problem List

## 2022-12-12 ENCOUNTER — Encounter (HOSPITAL_BASED_OUTPATIENT_CLINIC_OR_DEPARTMENT_OTHER): Payer: PPO | Attending: Internal Medicine | Admitting: Internal Medicine

## 2022-12-12 ENCOUNTER — Ambulatory Visit
Admission: RE | Admit: 2022-12-12 | Discharge: 2022-12-12 | Disposition: A | Discharge status: Home / Self Care | Payer: PPO | Source: Ambulatory Visit | Attending: Internal Medicine | Admitting: Internal Medicine

## 2022-12-12 ENCOUNTER — Other Ambulatory Visit: Payer: Self-pay | Admitting: Internal Medicine

## 2022-12-12 DIAGNOSIS — N183 Chronic kidney disease, stage 3 unspecified: Secondary | ICD-10-CM | POA: Diagnosis not present

## 2022-12-12 DIAGNOSIS — I70235 Atherosclerosis of native arteries of right leg with ulceration of other part of foot: Secondary | ICD-10-CM | POA: Diagnosis not present

## 2022-12-12 DIAGNOSIS — E11621 Type 2 diabetes mellitus with foot ulcer: Secondary | ICD-10-CM | POA: Diagnosis not present

## 2022-12-12 DIAGNOSIS — Z89421 Acquired absence of other right toe(s): Secondary | ICD-10-CM | POA: Insufficient documentation

## 2022-12-12 DIAGNOSIS — Z794 Long term (current) use of insulin: Secondary | ICD-10-CM | POA: Insufficient documentation

## 2022-12-12 DIAGNOSIS — L97512 Non-pressure chronic ulcer of other part of right foot with fat layer exposed: Secondary | ICD-10-CM | POA: Diagnosis present

## 2022-12-12 DIAGNOSIS — L089 Local infection of the skin and subcutaneous tissue, unspecified: Secondary | ICD-10-CM

## 2022-12-12 DIAGNOSIS — E1151 Type 2 diabetes mellitus with diabetic peripheral angiopathy without gangrene: Secondary | ICD-10-CM | POA: Diagnosis not present

## 2022-12-12 DIAGNOSIS — L97514 Non-pressure chronic ulcer of other part of right foot with necrosis of bone: Secondary | ICD-10-CM | POA: Insufficient documentation

## 2022-12-12 DIAGNOSIS — E1122 Type 2 diabetes mellitus with diabetic chronic kidney disease: Secondary | ICD-10-CM | POA: Insufficient documentation

## 2022-12-13 ENCOUNTER — Ambulatory Visit: Payer: PPO | Admitting: Student

## 2022-12-13 VITALS — BP 135/80 | HR 93 | Temp 97.6°F | Wt 175.2 lb

## 2022-12-13 DIAGNOSIS — E1142 Type 2 diabetes mellitus with diabetic polyneuropathy: Secondary | ICD-10-CM | POA: Diagnosis not present

## 2022-12-13 DIAGNOSIS — Z23 Encounter for immunization: Secondary | ICD-10-CM

## 2022-12-13 LAB — POCT GLYCOSYLATED HEMOGLOBIN (HGB A1C): HbA1c, POC (controlled diabetic range): 6.7 % (ref 0.0–7.0)

## 2022-12-13 NOTE — Progress Notes (Signed)
  SUBJECTIVE:   CHIEF COMPLAINT / HPI:   DM F/u -Meds: Lantus 6 units, Jardiance 25 mg daily Lab Results  Component Value Date   HGBA1C 6.7 12/13/2022  Note's CBG range at home 107-140. Will need a refill on insulin.    PERTINENT  PMH / PSH: DM   OBJECTIVE:  BP 135/80   Pulse 93   Temp 97.6 F (36.4 C)   Wt 175 lb 3.2 oz (79.5 kg)   BMI 24.44 kg/m  Physical Exam Constitutional:      General: He is not in acute distress.    Appearance: Normal appearance. He is not ill-appearing.  Cardiovascular:     Rate and Rhythm: Normal rate and regular rhythm.     Pulses: Normal pulses.     Heart sounds: Normal heart sounds. No murmur heard.    No friction rub. No gallop.  Pulmonary:     Effort: Pulmonary effort is normal. No respiratory distress.     Breath sounds: Normal breath sounds. No stridor. No wheezing, rhonchi or rales.  Neurological:     Mental Status: He is alert.      ASSESSMENT/PLAN:  Type 2 diabetes mellitus with peripheral neuropathy [facility]) Assessment & Plan: Patient comes in for follow-up of his diabetes.  A1c is 6.7 today, well below goal of less than 8.  His diabetes is well-controlled.  Will D/c patient's insulin, continue SGLT2.  Plan for patient to monitor sugars, and if sugars above 200, plan to restart insulin. -D/c Lantus -Continue Jardiance 25 mg -F/u 3 months  Orders: -     POCT glycosylated hemoglobin (Hb A1C)  Encounter for immunization -     Flu Vaccine Trivalent High Dose (Fluad) -     Pfizer Comirnaty Covid-19 Vaccine 54yrs & older   No follow-ups on file. [name], MD 12/15/2022, 10:49 PM PGY-3, [facility]

## 2022-12-13 NOTE — Patient Instructions (Signed)
It was great to see you! Thank you for allowing me to participate in your care!   Our plans for today:  - Diabetes Your A1c is 6.7! This is great and lower than we want you. We are going to discontinue your insulin for now, and keep an eye on your sugars  Stop taking insulin, but monitor your blood sugars.  -If morning sugars are getting into the 200's range, call the clinic and restart your insulin at 6 units  Make a follow up appointment for 3 months, to  be sure your sugars are well controlled  Continue Jardiance 25 mg daily  Take care and seek immediate care sooner if you develop any concerns.   Dr. [name], MD [facility]

## 2022-12-15 NOTE — Progress Notes (Signed)
FIDEL, Telford ([id number]) [id number] Page 1 of 9 Visit Report for 12/12/2022 Debridement Details Patient Name: Date of Service: [name] 12/12/2022 2:00 PM Medical Record Number: [id number] Patient Account Number: [id number] Date of Birth/Sex: Treating RN: 01/05/1934 (89 y.o. M) Primary Care Provider: Bess [name] Other Clinician: Referring Provider: Treating Provider/Extender: [name] in Treatment: 23 Debridement Performed for Assessment: Wound #3 Right Amputation Site - Toe Performed By: Physician [name]., MD The following information was scribed by: [name] The information was scribed for: [name] Debridement Type: Debridement Severity of Tissue Pre Debridement: Fat layer exposed Level of Consciousness (Pre-procedure): Awake and Alert Pre-procedure Verification/Time Out Yes - 14:50 Taken: Start Time: 14:51 Pain Control: Lidocaine 4% T opical Solution Percent of Wound Bed Debrided: 100% T Area Debrided (cm): otal 0.08 Tissue and other material debrided: Non-Viable, Slough, Slough Level: Non-Viable Tissue Debridement Description: Selective/Open Wound Instrument: Curette Bleeding: Moderate Hemostasis Achieved: Silver Nitrate Response to Treatment: Procedure was tolerated well Level of Consciousness (Post- Awake and Alert procedure): Post Debridement Measurements of Total Wound Length: (cm) 0.5 Width: (cm) 0.2 Depth: (cm) 0.2 Volume: (cm) 0.016 Character of Wound/Ulcer Post Debridement: Improved Severity of Tissue Post Debridement: Fat layer exposed Post Procedure Diagnosis Same as Pre-procedure Electronic Signature(s) Signed: 12/12/2022 5:32:55 PM By: [name] Najjar MD Entered By: [name] on 12/12/2022 15:05:12 -------------------------------------------------------------------------------- Debridement Details Patient Name: Date of Service: [name]  [name] 12/12/2022 2:00 PM Medical Record Number: [id number] Patient Account Number: [id number] Date of Birth/Sex: Treating RN: 11/08/1933 (89 y.o. M) Primary Care Provider: Bess [name] Other Clinician: Referring Provider: Treating Provider/Extender: [name] in Treatment: 23 Debridement Performed for Assessment: Wound #4 Right,Lateral Foot Performed By: Physician [name]., MD The following information was scribed by: [name], [name] ([id number]) [id number] Page 2 of 9 The information was scribed for: [name] Debridement Type: Debridement Severity of Tissue Pre Debridement: Fat layer exposed Level of Consciousness (Pre-procedure): Awake and Alert Pre-procedure Verification/Time Out Yes - 14:50 Taken: Start Time: 14:51 Pain Control: Lidocaine 4% T opical Solution Percent of Wound Bed Debrided: 100% T Area Debrided (cm): otal 2.12 Tissue and other material debrided: Non-Viable, Slough, Subcutaneous, Slough Level: Skin/Subcutaneous Tissue Debridement Description: Excisional Instrument: Blade, Curette, Forceps Bleeding: Moderate Hemostasis Achieved: Silver Nitrate Response to Treatment: Procedure was tolerated well Level of Consciousness (Post- Awake and Alert procedure): Post Debridement Measurements of Total Wound Length: (cm) 1.8 Width: (cm) 1.5 Depth: (cm) 0.1 Volume: (cm) 0.212 Character of Wound/Ulcer Post Debridement: Stable Severity of Tissue Post Debridement: Fat layer exposed Post Procedure Diagnosis Same as Pre-procedure Electronic Signature(s) Signed: 12/12/2022 5:32:55 PM By: [name] Najjar MD Entered By: [name] on 12/12/2022 15:11:40 -------------------------------------------------------------------------------- HPI Details Patient Name: Date of Service: [name] 12/12/2022 2:00 PM Medical Record Number: [id number] Patient Account Number:  [id number] Date of Birth/Sex: Treating RN: [birth year] (89 y.o. M) Primary Care Provider: Bess [name] Other Clinician: Referring Provider: Treating Provider/Extender: [name] in Treatment: 23 History of Present Illness HPI Description: 07/04/2022 [name] is an 89 year old male with a past medical history of uncontrolled, insulin dependent type 2 diabetes, CKD stage III, and COPD that presents to the clinic for a right foot wound. He has an extensive history of peripheral arterial disease to the right leg. He has had a right superficial femoral to posterior tibial artery bypass by Dr. Elonda [name] in 2020. He had a right common femoral to  and I have ordered an x-ray. Electronic Signature(s) Signed: 12/14/2022 5:49:45 PM By: [name] Stall RN, BSN Signed: 12/15/2022 4:27:46 PM By: [name] Najjar MD Previous Signature: 12/12/2022 5:32:55 PM Version By: [name] Najjar MD Entered By: [name] on 12/14/2022 17:46:45 -------------------------------------------------------------------------------- SuperBill Details Patient Name: Date of Service: [name] 12/12/2022 Medical Record Number: [id number] Patient Account Number: [id number] Date of Birth/Sex: Treating RN: 1933-06-16 (89 y.o. M) Primary Care Provider: Bess [name] Other Clinician: Referring Provider: Treating Provider/Extender: [name] in Treatment: 23 Diagnosis Coding ICD-10 Codes Code Description [id number] Non-pressure chronic ulcer of other part of right foot with fat layer exposed E11.621 Type 2 diabetes mellitus with foot ulcer I70.235 Atherosclerosis of native arteries of right leg with ulceration of other part of foot I73.9 Peripheral vascular disease, unspecified Facility Procedures : CPT4 Code: [id number] Description: 11042 - DEB SUBQ TISSUE 20 SQ CM/< ICD-10 Diagnosis Description L97.512 Non-pressure chronic ulcer of other part of right foot with fat layer exposed E11.621  Type 2 diabetes mellitus with foot ulcer Modifier: Quantity: 1 Physician Procedures : CPT4 Code Description Modifier [id number] 11042 - WC PHYS SUBQ TISS 20 SQ CM ICD-10 Diagnosis Description L97.512 Non-pressure chronic ulcer of other part of right foot with fat layer exposed E11.621 Type 2 diabetes mellitus with foot ulcer Quantity: 1 Electronic Signature(s) Signed: 12/12/2022 5:32:55 PM By: [name] Najjar MD Entered By: [name] on 12/12/2022 15:12:20  dressing(s) with water repellant cover (for example, large plastic bag) or a cast cover and may then take shower. Edema Control - Lymphedema / SCD / Other Elevate legs to the level of the heart or above for 30 minutes daily and/or when sitting for 3-4 times a day throughout the day. Avoid standing for long periods of time. Non Wound Condition Right Lower Extremity Other Non Wound Condition Orders/Instructions: - Right lateral foot -optifoam to protect non-wound Wound Treatment Wound #3 - Amputation Site - Toe Wound Laterality: Right Cleanser: Vashe 5.8 (oz) Every Other Day/15 Days Discharge Instructions: Cleanse the wound with Vashe prior to applying a clean dressing using gauze sponges, not tissue or cotton balls. Topical: Skintegrity Hydrogel 4 (oz) Every Other Day/15 Days Discharge Instructions: Apply hydrogel as directed Prim Dressing: Endoform 2x2 in Every Other Day/15 Days ary Discharge Instructions: Moisten with saline Secondary Dressing: Woven Gauze Sponge, Non-Sterile 4x4 in (Generic) Every Other Day/15 Days Discharge Instructions: Apply over primary dressing as directed. Secured With: Paper Tape, 2x10 (in/yd) Every Other Day/15 Days Discharge Instructions: Secure dressing with tape as directed. Secured With: tubigrip size D Every Other Day/15 Days Discharge Instructions: if needed -double layer from toes to midfoot. Wound #4 - Foot Wound Laterality: Right, Lateral Cleanser: Soap and Water Every Other Day/15 Days Discharge Instructions: May shower and wash wound  with dial antibacterial soap and water prior to dressing change. Cleanser: Wound Cleanser Every Other Day/15 Days Discharge Instructions: Cleanse the wound with wound cleanser prior to applying a clean dressing using gauze sponges, not tissue or cotton balls. Peri-Wound Care: Skin Prep Every Other Day/15 Days Discharge Instructions: Use skin prep as directed Prim Dressing: IODOFLEX 0.9% Cadexomer Iodine Pad 4x6 cm Every Other Day/15 Days ary Discharge Instructions: Apply to wound bed as instructed Secondary Dressing: Optifoam Non-Adhesive Dressing, 4x4 in Every Other Day/15 Days Discharge Instructions: Apply over primary dressing or foam border as directed. Laboratory naerobe culture (MICRO) - Culture to evaluate for potential infection of right foot wound. Bacteria identified in Unspecified specimen by A LOINC Code: 635-3 Convenience Name: Anaerobic culture MEES, Karey ([id number]) [id number] Page 5 of 9 Radiology X-ray, foot - X-ray of right foot to rule out infectious process. Electronic Signature(s) Signed: 12/12/2022 5:32:55 PM By: [name] Najjar MD Signed: 12/15/2022 4:20:59 PM By: [name] Entered By: [name] on 12/12/2022 15:17:19 Prescription 12/12/2022 -------------------------------------------------------------------------------- [name] MD Patient Name: Provider: [birth year] [id number] Date of Birth: NPI#: [id number] Sex: DEA #: [phone] Phone #: License #: [id number] UPN: Patient Address: [address] [facility] [place] [place], [place] [address] [address] Floor [place], [place] [address] [phone] Allergies No Known Allergies Provider's Orders X-ray, foot - X-ray of right foot to rule out infectious process. Hand Signature: Date(s): Prescription 12/12/2022 [name] MD Patient Name: Provider: [birth year]  [id number] Date of Birth: NPI#: Judie Petit [id number] Sex: DEA #: [phone] Phone #: License #: [id number] UPN: Patient Address: [address] [facility] [place], [place] [address] [address] Floor [place], [place] [address] [phone] Allergies No Known Allergies Provider's Orders naerobe culture - Culture to evaluate for potential infection of right foot wound. Bacteria identified in Unspecified specimen by A LOINC Code: 635-3 Convenience Name: Anaerobic culture Hand Signature: Date(s): Electronic Signature(s) Signed: 12/12/2022 5:32:55 PM By: [name] Najjar MD Signed: 12/15/2022 4:20:59 PM By: [name] Entered By: [name] on 12/12/2022 15:17:19 Constante, Jamison (  FIDEL, Telford ([id number]) [id number] Page 1 of 9 Visit Report for 12/12/2022 Debridement Details Patient Name: Date of Service: [name] 12/12/2022 2:00 PM Medical Record Number: [id number] Patient Account Number: [id number] Date of Birth/Sex: Treating RN: 01/05/1934 (89 y.o. M) Primary Care Provider: Bess [name] Other Clinician: Referring Provider: Treating Provider/Extender: [name] in Treatment: 23 Debridement Performed for Assessment: Wound #3 Right Amputation Site - Toe Performed By: Physician [name]., MD The following information was scribed by: [name] The information was scribed for: [name] Debridement Type: Debridement Severity of Tissue Pre Debridement: Fat layer exposed Level of Consciousness (Pre-procedure): Awake and Alert Pre-procedure Verification/Time Out Yes - 14:50 Taken: Start Time: 14:51 Pain Control: Lidocaine 4% T opical Solution Percent of Wound Bed Debrided: 100% T Area Debrided (cm): otal 0.08 Tissue and other material debrided: Non-Viable, Slough, Slough Level: Non-Viable Tissue Debridement Description: Selective/Open Wound Instrument: Curette Bleeding: Moderate Hemostasis Achieved: Silver Nitrate Response to Treatment: Procedure was tolerated well Level of Consciousness (Post- Awake and Alert procedure): Post Debridement Measurements of Total Wound Length: (cm) 0.5 Width: (cm) 0.2 Depth: (cm) 0.2 Volume: (cm) 0.016 Character of Wound/Ulcer Post Debridement: Improved Severity of Tissue Post Debridement: Fat layer exposed Post Procedure Diagnosis Same as Pre-procedure Electronic Signature(s) Signed: 12/12/2022 5:32:55 PM By: [name] Najjar MD Entered By: [name] on 12/12/2022 15:05:12 -------------------------------------------------------------------------------- Debridement Details Patient Name: Date of Service: [name]  [name] 12/12/2022 2:00 PM Medical Record Number: [id number] Patient Account Number: [id number] Date of Birth/Sex: Treating RN: 11/08/1933 (89 y.o. M) Primary Care Provider: Bess [name] Other Clinician: Referring Provider: Treating Provider/Extender: [name] in Treatment: 23 Debridement Performed for Assessment: Wound #4 Right,Lateral Foot Performed By: Physician [name]., MD The following information was scribed by: [name], [name] ([id number]) [id number] Page 2 of 9 The information was scribed for: [name] Debridement Type: Debridement Severity of Tissue Pre Debridement: Fat layer exposed Level of Consciousness (Pre-procedure): Awake and Alert Pre-procedure Verification/Time Out Yes - 14:50 Taken: Start Time: 14:51 Pain Control: Lidocaine 4% T opical Solution Percent of Wound Bed Debrided: 100% T Area Debrided (cm): otal 2.12 Tissue and other material debrided: Non-Viable, Slough, Subcutaneous, Slough Level: Skin/Subcutaneous Tissue Debridement Description: Excisional Instrument: Blade, Curette, Forceps Bleeding: Moderate Hemostasis Achieved: Silver Nitrate Response to Treatment: Procedure was tolerated well Level of Consciousness (Post- Awake and Alert procedure): Post Debridement Measurements of Total Wound Length: (cm) 1.8 Width: (cm) 1.5 Depth: (cm) 0.1 Volume: (cm) 0.212 Character of Wound/Ulcer Post Debridement: Stable Severity of Tissue Post Debridement: Fat layer exposed Post Procedure Diagnosis Same as Pre-procedure Electronic Signature(s) Signed: 12/12/2022 5:32:55 PM By: [name] Najjar MD Entered By: [name] on 12/12/2022 15:11:40 -------------------------------------------------------------------------------- HPI Details Patient Name: Date of Service: [name] 12/12/2022 2:00 PM Medical Record Number: [id number] Patient Account Number:  [id number] Date of Birth/Sex: Treating RN: [birth year] (89 y.o. M) Primary Care Provider: Bess [name] Other Clinician: Referring Provider: Treating Provider/Extender: [name] in Treatment: 23 History of Present Illness HPI Description: 07/04/2022 [name] is an 89 year old male with a past medical history of uncontrolled, insulin dependent type 2 diabetes, CKD stage III, and COPD that presents to the clinic for a right foot wound. He has an extensive history of peripheral arterial disease to the right leg. He has had a right superficial femoral to posterior tibial artery bypass by Dr. Elonda [name] in 2020. He had a right common femoral to  dressing(s) with water repellant cover (for example, large plastic bag) or a cast cover and may then take shower. Edema Control - Lymphedema / SCD / Other Elevate legs to the level of the heart or above for 30 minutes daily and/or when sitting for 3-4 times a day throughout the day. Avoid standing for long periods of time. Non Wound Condition Right Lower Extremity Other Non Wound Condition Orders/Instructions: - Right lateral foot -optifoam to protect non-wound Wound Treatment Wound #3 - Amputation Site - Toe Wound Laterality: Right Cleanser: Vashe 5.8 (oz) Every Other Day/15 Days Discharge Instructions: Cleanse the wound with Vashe prior to applying a clean dressing using gauze sponges, not tissue or cotton balls. Topical: Skintegrity Hydrogel 4 (oz) Every Other Day/15 Days Discharge Instructions: Apply hydrogel as directed Prim Dressing: Endoform 2x2 in Every Other Day/15 Days ary Discharge Instructions: Moisten with saline Secondary Dressing: Woven Gauze Sponge, Non-Sterile 4x4 in (Generic) Every Other Day/15 Days Discharge Instructions: Apply over primary dressing as directed. Secured With: Paper Tape, 2x10 (in/yd) Every Other Day/15 Days Discharge Instructions: Secure dressing with tape as directed. Secured With: tubigrip size D Every Other Day/15 Days Discharge Instructions: if needed -double layer from toes to midfoot. Wound #4 - Foot Wound Laterality: Right, Lateral Cleanser: Soap and Water Every Other Day/15 Days Discharge Instructions: May shower and wash wound  with dial antibacterial soap and water prior to dressing change. Cleanser: Wound Cleanser Every Other Day/15 Days Discharge Instructions: Cleanse the wound with wound cleanser prior to applying a clean dressing using gauze sponges, not tissue or cotton balls. Peri-Wound Care: Skin Prep Every Other Day/15 Days Discharge Instructions: Use skin prep as directed Prim Dressing: IODOFLEX 0.9% Cadexomer Iodine Pad 4x6 cm Every Other Day/15 Days ary Discharge Instructions: Apply to wound bed as instructed Secondary Dressing: Optifoam Non-Adhesive Dressing, 4x4 in Every Other Day/15 Days Discharge Instructions: Apply over primary dressing or foam border as directed. Laboratory naerobe culture (MICRO) - Culture to evaluate for potential infection of right foot wound. Bacteria identified in Unspecified specimen by A LOINC Code: 635-3 Convenience Name: Anaerobic culture MEES, Karey ([id number]) [id number] Page 5 of 9 Radiology X-ray, foot - X-ray of right foot to rule out infectious process. Electronic Signature(s) Signed: 12/12/2022 5:32:55 PM By: [name] Najjar MD Signed: 12/15/2022 4:20:59 PM By: [name] Entered By: [name] on 12/12/2022 15:17:19 Prescription 12/12/2022 -------------------------------------------------------------------------------- [name] MD Patient Name: Provider: [birth year] [id number] Date of Birth: NPI#: [id number] Sex: DEA #: [phone] Phone #: License #: [id number] UPN: Patient Address: [address] [facility] [place], [place] [address] [address] Floor [place], [place] [address] [phone] Allergies No Known Allergies Provider's Orders X-ray, foot - X-ray of right foot to rule out infectious process. Hand Signature: Date(s): Prescription 12/12/2022 [name] MD Patient Name: Provider: [birth year]  [id number] Date of Birth: NPI#: Judie Petit [id number] Sex: DEA #: [phone] Phone #: License #: [id number] UPN: Patient Address: [address] [facility] [place] [place], [place] [address] [address] Floor [place], [place] [address] [phone] Allergies No Known Allergies Provider's Orders naerobe culture - Culture to evaluate for potential infection of right foot wound. Bacteria identified in Unspecified specimen by A LOINC Code: 635-3 Convenience Name: Anaerobic culture Hand Signature: Date(s): Electronic Signature(s) Signed: 12/12/2022 5:32:55 PM By: [name] Najjar MD Signed: 12/15/2022 4:20:59 PM By: [name] Entered By: [name] on 12/12/2022 15:17:19 Constante, Jamison (  dressing(s) with water repellant cover (for example, large plastic bag) or a cast cover and may then take shower. Edema Control - Lymphedema / SCD / Other Elevate legs to the level of the heart or above for 30 minutes daily and/or when sitting for 3-4 times a day throughout the day. Avoid standing for long periods of time. Non Wound Condition Right Lower Extremity Other Non Wound Condition Orders/Instructions: - Right lateral foot -optifoam to protect non-wound Wound Treatment Wound #3 - Amputation Site - Toe Wound Laterality: Right Cleanser: Vashe 5.8 (oz) Every Other Day/15 Days Discharge Instructions: Cleanse the wound with Vashe prior to applying a clean dressing using gauze sponges, not tissue or cotton balls. Topical: Skintegrity Hydrogel 4 (oz) Every Other Day/15 Days Discharge Instructions: Apply hydrogel as directed Prim Dressing: Endoform 2x2 in Every Other Day/15 Days ary Discharge Instructions: Moisten with saline Secondary Dressing: Woven Gauze Sponge, Non-Sterile 4x4 in (Generic) Every Other Day/15 Days Discharge Instructions: Apply over primary dressing as directed. Secured With: Paper Tape, 2x10 (in/yd) Every Other Day/15 Days Discharge Instructions: Secure dressing with tape as directed. Secured With: tubigrip size D Every Other Day/15 Days Discharge Instructions: if needed -double layer from toes to midfoot. Wound #4 - Foot Wound Laterality: Right, Lateral Cleanser: Soap and Water Every Other Day/15 Days Discharge Instructions: May shower and wash wound  with dial antibacterial soap and water prior to dressing change. Cleanser: Wound Cleanser Every Other Day/15 Days Discharge Instructions: Cleanse the wound with wound cleanser prior to applying a clean dressing using gauze sponges, not tissue or cotton balls. Peri-Wound Care: Skin Prep Every Other Day/15 Days Discharge Instructions: Use skin prep as directed Prim Dressing: IODOFLEX 0.9% Cadexomer Iodine Pad 4x6 cm Every Other Day/15 Days ary Discharge Instructions: Apply to wound bed as instructed Secondary Dressing: Optifoam Non-Adhesive Dressing, 4x4 in Every Other Day/15 Days Discharge Instructions: Apply over primary dressing or foam border as directed. Laboratory naerobe culture (MICRO) - Culture to evaluate for potential infection of right foot wound. Bacteria identified in Unspecified specimen by A LOINC Code: 635-3 Convenience Name: Anaerobic culture MEES, Karey ([id number]) [id number] Page 5 of 9 Radiology X-ray, foot - X-ray of right foot to rule out infectious process. Electronic Signature(s) Signed: 12/12/2022 5:32:55 PM By: [name] Najjar MD Signed: 12/15/2022 4:20:59 PM By: [name] Entered By: [name] on 12/12/2022 15:17:19 Prescription 12/12/2022 -------------------------------------------------------------------------------- [name] MD Patient Name: Provider: [birth year] [id number] Date of Birth: NPI#: [id number] Sex: DEA #: [phone] Phone #: License #: [id number] UPN: Patient Address: [address] [facility] [place], [place] [address] [address] Floor [place], [place] [address] [phone] Allergies No Known Allergies Provider's Orders X-ray, foot - X-ray of right foot to rule out infectious process. Hand Signature: Date(s): Prescription 12/12/2022 [name] MD Patient Name: Provider: [birth year]  [id number] Date of Birth: NPI#: Judie Petit [id number] Sex: DEA #: [phone] Phone #: License #: [id number] UPN: Patient Address: [address] [facility] [place] [place], [place] [address] [address] [place], [place] [address] [phone] Allergies No Known Allergies Provider's Orders naerobe culture - Culture to evaluate for potential infection of right foot wound. Bacteria identified in Unspecified specimen by A LOINC Code: 635-3 Convenience Name: Anaerobic culture Hand Signature: Date(s): Electronic Signature(s) Signed: 12/12/2022 5:32:55 PM By: [name] Najjar MD Signed: 12/15/2022 4:20:59 PM By: [name] Entered By: [name] on 12/12/2022 15:17:19 Constante, Jamison (  dressing(s) with water repellant cover (for example, large plastic bag) or a cast cover and may then take shower. Edema Control - Lymphedema / SCD / Other Elevate legs to the level of the heart or above for 30 minutes daily and/or when sitting for 3-4 times a day throughout the day. Avoid standing for long periods of time. Non Wound Condition Right Lower Extremity Other Non Wound Condition Orders/Instructions: - Right lateral foot -optifoam to protect non-wound Wound Treatment Wound #3 - Amputation Site - Toe Wound Laterality: Right Cleanser: Vashe 5.8 (oz) Every Other Day/15 Days Discharge Instructions: Cleanse the wound with Vashe prior to applying a clean dressing using gauze sponges, not tissue or cotton balls. Topical: Skintegrity Hydrogel 4 (oz) Every Other Day/15 Days Discharge Instructions: Apply hydrogel as directed Prim Dressing: Endoform 2x2 in Every Other Day/15 Days ary Discharge Instructions: Moisten with saline Secondary Dressing: Woven Gauze Sponge, Non-Sterile 4x4 in (Generic) Every Other Day/15 Days Discharge Instructions: Apply over primary dressing as directed. Secured With: Paper Tape, 2x10 (in/yd) Every Other Day/15 Days Discharge Instructions: Secure dressing with tape as directed. Secured With: tubigrip size D Every Other Day/15 Days Discharge Instructions: if needed -double layer from toes to midfoot. Wound #4 - Foot Wound Laterality: Right, Lateral Cleanser: Soap and Water Every Other Day/15 Days Discharge Instructions: May shower and wash wound  with dial antibacterial soap and water prior to dressing change. Cleanser: Wound Cleanser Every Other Day/15 Days Discharge Instructions: Cleanse the wound with wound cleanser prior to applying a clean dressing using gauze sponges, not tissue or cotton balls. Peri-Wound Care: Skin Prep Every Other Day/15 Days Discharge Instructions: Use skin prep as directed Prim Dressing: IODOFLEX 0.9% Cadexomer Iodine Pad 4x6 cm Every Other Day/15 Days ary Discharge Instructions: Apply to wound bed as instructed Secondary Dressing: Optifoam Non-Adhesive Dressing, 4x4 in Every Other Day/15 Days Discharge Instructions: Apply over primary dressing or foam border as directed. Laboratory naerobe culture (MICRO) - Culture to evaluate for potential infection of right foot wound. Bacteria identified in Unspecified specimen by A LOINC Code: 635-3 Convenience Name: Anaerobic culture MEES, Karey ([id number]) [id number] Page 5 of 9 Radiology X-ray, foot - X-ray of right foot to rule out infectious process. Electronic Signature(s) Signed: 12/12/2022 5:32:55 PM By: [name] Najjar MD Signed: 12/15/2022 4:20:59 PM By: [name] Entered By: [name] on 12/12/2022 15:17:19 Prescription 12/12/2022 -------------------------------------------------------------------------------- [name] MD Patient Name: Provider: [birth year] [id number] Date of Birth: NPI#: [id number] Sex: DEA #: [phone] Phone #: License #: [id number] UPN: Patient Address: [address] [facility] [place] [place], [place] [address] [address] Floor [place], [place] [address] [phone] Allergies No Known Allergies Provider's Orders X-ray, foot - X-ray of right foot to rule out infectious process. Hand Signature: Date(s): Prescription 12/12/2022 [name] MD Patient Name: Provider: [birth year]  [id number] Date of Birth: NPI#: Judie Petit [id number] Sex: DEA #: [phone] Phone #: License #: [id number] UPN: Patient Address: [address] [facility] [place], [place] [address] [address] Floor [place], [place] [address] [phone] Allergies No Known Allergies Provider's Orders naerobe culture - Culture to evaluate for potential infection of right foot wound. Bacteria identified in Unspecified specimen by A LOINC Code: 635-3 Convenience Name: Anaerobic culture Hand Signature: Date(s): Electronic Signature(s) Signed: 12/12/2022 5:32:55 PM By: [name] Najjar MD Signed: 12/15/2022 4:20:59 PM By: [name] Entered By: [name] on 12/12/2022 15:17:19 Constante, Jamison (  and I have ordered an x-ray. Electronic Signature(s) Signed: 12/14/2022 5:49:45 PM By: [name] Stall RN, BSN Signed: 12/15/2022 4:27:46 PM By: [name] Najjar MD Previous Signature: 12/12/2022 5:32:55 PM Version By: [name] Najjar MD Entered By: [name] on 12/14/2022 17:46:45 -------------------------------------------------------------------------------- SuperBill Details Patient Name: Date of Service: [name] 12/12/2022 Medical Record Number: [id number] Patient Account Number: [id number] Date of Birth/Sex: Treating RN: 1933-06-16 (89 y.o. M) Primary Care Provider: Bess [name] Other Clinician: Referring Provider: Treating Provider/Extender: [name] in Treatment: 23 Diagnosis Coding ICD-10 Codes Code Description [id number] Non-pressure chronic ulcer of other part of right foot with fat layer exposed E11.621 Type 2 diabetes mellitus with foot ulcer I70.235 Atherosclerosis of native arteries of right leg with ulceration of other part of foot I73.9 Peripheral vascular disease, unspecified Facility Procedures : CPT4 Code: [id number] Description: 11042 - DEB SUBQ TISSUE 20 SQ CM/< ICD-10 Diagnosis Description L97.512 Non-pressure chronic ulcer of other part of right foot with fat layer exposed E11.621  Type 2 diabetes mellitus with foot ulcer Modifier: Quantity: 1 Physician Procedures : CPT4 Code Description Modifier [id number] 11042 - WC PHYS SUBQ TISS 20 SQ CM ICD-10 Diagnosis Description L97.512 Non-pressure chronic ulcer of other part of right foot with fat layer exposed E11.621 Type 2 diabetes mellitus with foot ulcer Quantity: 1 Electronic Signature(s) Signed: 12/12/2022 5:32:55 PM By: [name] Najjar MD Entered By: [name] on 12/12/2022 15:12:20  dressing(s) with water repellant cover (for example, large plastic bag) or a cast cover and may then take shower. Edema Control - Lymphedema / SCD / Other Elevate legs to the level of the heart or above for 30 minutes daily and/or when sitting for 3-4 times a day throughout the day. Avoid standing for long periods of time. Non Wound Condition Right Lower Extremity Other Non Wound Condition Orders/Instructions: - Right lateral foot -optifoam to protect non-wound Wound Treatment Wound #3 - Amputation Site - Toe Wound Laterality: Right Cleanser: Vashe 5.8 (oz) Every Other Day/15 Days Discharge Instructions: Cleanse the wound with Vashe prior to applying a clean dressing using gauze sponges, not tissue or cotton balls. Topical: Skintegrity Hydrogel 4 (oz) Every Other Day/15 Days Discharge Instructions: Apply hydrogel as directed Prim Dressing: Endoform 2x2 in Every Other Day/15 Days ary Discharge Instructions: Moisten with saline Secondary Dressing: Woven Gauze Sponge, Non-Sterile 4x4 in (Generic) Every Other Day/15 Days Discharge Instructions: Apply over primary dressing as directed. Secured With: Paper Tape, 2x10 (in/yd) Every Other Day/15 Days Discharge Instructions: Secure dressing with tape as directed. Secured With: tubigrip size D Every Other Day/15 Days Discharge Instructions: if needed -double layer from toes to midfoot. Wound #4 - Foot Wound Laterality: Right, Lateral Cleanser: Soap and Water Every Other Day/15 Days Discharge Instructions: May shower and wash wound  with dial antibacterial soap and water prior to dressing change. Cleanser: Wound Cleanser Every Other Day/15 Days Discharge Instructions: Cleanse the wound with wound cleanser prior to applying a clean dressing using gauze sponges, not tissue or cotton balls. Peri-Wound Care: Skin Prep Every Other Day/15 Days Discharge Instructions: Use skin prep as directed Prim Dressing: IODOFLEX 0.9% Cadexomer Iodine Pad 4x6 cm Every Other Day/15 Days ary Discharge Instructions: Apply to wound bed as instructed Secondary Dressing: Optifoam Non-Adhesive Dressing, 4x4 in Every Other Day/15 Days Discharge Instructions: Apply over primary dressing or foam border as directed. Laboratory naerobe culture (MICRO) - Culture to evaluate for potential infection of right foot wound. Bacteria identified in Unspecified specimen by A LOINC Code: 635-3 Convenience Name: Anaerobic culture MEES, Karey ([id number]) [id number] Page 5 of 9 Radiology X-ray, foot - X-ray of right foot to rule out infectious process. Electronic Signature(s) Signed: 12/12/2022 5:32:55 PM By: [name] Najjar MD Signed: 12/15/2022 4:20:59 PM By: [name] Entered By: [name] on 12/12/2022 15:17:19 Prescription 12/12/2022 -------------------------------------------------------------------------------- [name] MD Patient Name: Provider: [birth year] [id number] Date of Birth: NPI#: [id number] Sex: DEA #: [phone] Phone #: License #: [id number] UPN: Patient Address: [address] [facility] [place] [place], [place] [address] [address] Floor [place], [place] [address] [phone] Allergies No Known Allergies Provider's Orders X-ray, foot - X-ray of right foot to rule out infectious process. Hand Signature: Date(s): Prescription 12/12/2022 [name] MD Patient Name: Provider: [birth year]  [id number] Date of Birth: NPI#: Judie Petit [id number] Sex: DEA #: [phone] Phone #: License #: [id number] UPN: Patient Address: [address] [facility] [place] [place], [place] [address] [address] [place], [place] [address] [phone] Allergies No Known Allergies Provider's Orders naerobe culture - Culture to evaluate for potential infection of right foot wound. Bacteria identified in Unspecified specimen by A LOINC Code: 635-3 Convenience Name: Anaerobic culture Hand Signature: Date(s): Electronic Signature(s) Signed: 12/12/2022 5:32:55 PM By: [name] Najjar MD Signed: 12/15/2022 4:20:59 PM By: [name] Entered By: [name] on 12/12/2022 15:17:19 Constante, Jamison (

## 2022-12-15 NOTE — Assessment & Plan Note (Addendum)
Patient comes in for follow-up of his diabetes.  A1c is 6.7 today, well below goal of less than 8.  His diabetes is well-controlled.  Will D/c patient's insulin, continue SGLT2.  Plan for patient to monitor sugars, and if sugars above 200, plan to restart insulin. -D/c Lantus -Continue Jardiance 25 mg -F/u 3 months

## 2022-12-15 NOTE — Progress Notes (Signed)
[name], [name] ([id number]) [id number] Page 1 of 9 Visit Report for 12/12/2022 Arrival Information Details Patient Name: Date of Service: [name] [name] 12/12/2022 2:00 PM Medical Record Number: [id number] Patient Account Number: [id number] Date of Birth/Sex: Treating RN: 15-Apr-1933 (89 y.o. [name] Primary Care Chamara Dyck: [name] Other Clinician: Referring Deedee Lybarger: Treating Areeb Corron/Extender: [name] in Treatment: 23 Visit Information History Since Last Visit All ordered tests and consults were completed: Yes Patient Arrived: Ambulatory Added or deleted any medications: No Arrival Time: 14:13 Any new allergies or adverse reactions: No Accompanied By: daughter Had a fall or experienced change in No Transfer Assistance: None activities of daily living that may affect Patient Requires Transmission-Based Precautions: No risk of falls: Patient Has Alerts: Yes Signs or symptoms of abuse/neglect since last visito No Patient Alerts: Patient on Blood Thinner Hospitalized since last visit: No Implantable device outside of the clinic excluding No cellular tissue based products placed in the center since last visit: Has Dressing in Place as Prescribed: Yes Pain Present Now: No Electronic Signature(s) Signed: 12/15/2022 4:20:59 PM By: Brenton Grills Entered By: Brenton Grills on 12/12/2022 14:14:53 -------------------------------------------------------------------------------- Encounter Discharge Information Details Patient Name: Date of Service: [name] [name] 12/12/2022 2:00 PM Medical Record Number: [id number] Patient Account Number: [id number] Date of Birth/Sex: Treating RN: [birth year] (89 y.o. [name] Primary Care Frayda Egley: [name] Other Clinician: Referring Tilley Faeth: Treating Amaziah Ghosh/Extender: [name] in Treatment: 23 Encounter Discharge  Information Items Post Procedure Vitals Discharge Condition: Stable Temperature (F): 98 Ambulatory Status: Ambulatory Pulse (bpm): 80 Discharge Destination: Home Respiratory Rate (breaths/min): 18 Transportation: Private Auto Blood Pressure (mmHg): 118/74 Accompanied By: daughter Schedule Follow-up Appointment: Yes Clinical Summary of Care: Patient Declined Electronic Signature(s) Signed: 12/15/2022 4:20:59 PM By: Brenton Grills Entered By: Brenton Grills on 12/12/2022 15:21:53 Laprade, Wiley ([id number]) [id number] Page 2 of 9 -------------------------------------------------------------------------------- Lower Extremity Assessment Details Patient Name: Date of Service: [name] [name] 12/12/2022 2:00 PM Medical Record Number: [id number] Patient Account Number: [id number] Date of Birth/Sex: Treating RN: February 05, 1934 (89 y.o. [name] Primary Care Stetson Pelaez: [name] Other Clinician: Referring Nolawi Kanady: Treating Briell Paulette/Extender: [name] in Treatment: 23 Edema Assessment Assessed: [name]: No] Franne Forts: No] Edema: [Left: N] [Right: o] Calf Left: Right: Point of Measurement: From Medial Instep 31 cm Ankle Left: Right: Point of Measurement: From Medial Instep 21 cm Vascular Assessment Pulses: Dorsalis Pedis Palpable: [Right:Yes] Extremity colors, hair growth, and conditions: Extremity Color: [Right:Normal] Hair Growth on Extremity: [Right:No] Temperature of Extremity: [Right:Warm] Capillary Refill: [Right:< 3 seconds] Dependent Rubor: [Right:No No] Toe Nail Assessment Left: Right: Thick: Yes Discolored: Yes Deformed: Yes Improper Length and Hygiene: Yes Electronic Signature(s) Signed: 12/15/2022 4:20:59 PM By: Brenton Grills Entered By: Brenton Grills on 12/12/2022 14:18:58 -------------------------------------------------------------------------------- Multi Wound Chart Details Patient  Name: Date of Service: [name] [name] 12/12/2022 2:00 PM Medical Record Number: [id number] Patient Account Number: [id number] Date of Birth/Sex: Treating RN: 12-21-1933 (89 y.o. M) Primary Care Tracker Mance: [name] Other Clinician: Referring Bilal Manzer: Treating Cassandria Drew/Extender: [name] in Treatment: 23 Vital Signs Height(in): 71 Capillary Blood Glucose(mg/dl): 601 Weight(lbs): 093 Pulse(bpm): 99 Body Mass Index(BMI): 23.7 Blood Pressure(mmHg): 122/83 Clanton, Jasten ([id number]) [id number] Page 3 of 9 Temperature(F): 97.8 Respiratory Rate(breaths/min): 18 [3:Photos:] [N/A:N/A] Right Amputation Site - Toe Right, Lateral Foot N/A Wound Location: Gradually Appeared Shear/Friction N/A Wounding Event: Diabetic Wound/Ulcer of the Lower Diabetic Wound/Ulcer of the Lower N/A Primary Etiology:  layer from toes to midfoot. Compression Wrap Compression Stockings Add-Ons Electronic Signature(s) Signed: 12/15/2022 4:20:59 PM By: Brenton Grills Entered By: Brenton Grills on 12/12/2022 14:25:58 -------------------------------------------------------------------------------- Wound Assessment Details Patient Name: Date of Service: [name] [name] 12/12/2022 2:00 PM Medical Record Number: [id number] Patient Account Number: [id number] Date of Birth/Sex: Treating RN: 1933/06/06 (89 y.o. [name] Primary Care Antwain Caliendo: [name] Other Clinician: Referring Corday Wyka: Treating Marceil Welp/Extender: [name] in Treatment: 23 Wound Status Wound Number: 4 Primary Diabetic Wound/Ulcer of the Lower Extremity Etiology: Wound Location: Right, Lateral Foot Secondary Pressure Ulcer Wounding Event: Shear/Friction Etiology: Date Acquired: 11/21/2022 Wound Open Weeks Of Treatment: 1 Status: Clustered Wound: No Comorbid Cataracts, Anemia, Asthma, Chronic Obstructive Pulmonary History: Disease (COPD), Peripheral Arterial Disease, Type II Diabetes, Gout, Osteoarthritis, Neuropathy Photos Wound Measurements Length: (cm) 2.3 Width: (cm) 2.2 [name], [name] ([id number]) Depth: (cm) 0.5 Area: (cm) 3.974 Volume: (cm) 1.987 % Reduction in Area: 23.3% % Reduction in Volume: -283.6% [id number] Page 8 of 9 Epithelialization: Small (1-33%) Tunneling:  No Undermining: Yes Starting Position (o'clock): 2 Ending Position (o'clock): 5 Maximum Distance: (cm) 0.5 Wound Description Classification: Grade 1 Wound Margin: Distinct, outline attached Exudate Amount: Medium Exudate Type: Serosanguineous Exudate Color: red, brown Foul Odor After Cleansing: No Slough/Fibrino No Wound Bed Granulation Amount: Large (67-100%) Exposed Structure Granulation Quality: Red, Pink Fascia Exposed: No Necrotic Amount: None Present (0%) Fat Layer (Subcutaneous Tissue) Exposed: Yes Tendon Exposed: No Muscle Exposed: No Joint Exposed: No Bone Exposed: No Periwound Skin Texture Texture Color No Abnormalities Noted: No No Abnormalities Noted: No Callus: No Atrophie Blanche: No Crepitus: No Cyanosis: No Excoriation: No Ecchymosis: No Induration: No Erythema: No Rash: No Hemosiderin Staining: No Scarring: No Mottled: No Pallor: No Moisture Rubor: No No Abnormalities Noted: No Dry / Scaly: No Maceration: No Treatment Notes Wound #4 (Foot) Wound Laterality: Right, Lateral Cleanser Soap and Water Discharge Instruction: May shower and wash wound with dial antibacterial soap and water prior to dressing change. Wound Cleanser Discharge Instruction: Cleanse the wound with wound cleanser prior to applying a clean dressing using gauze sponges, not tissue or cotton balls. Peri-Wound Care Skin Prep Discharge Instruction: Use skin prep as directed Topical Primary Dressing IODOFLEX 0.9% Cadexomer Iodine Pad 4x6 cm Discharge Instruction: Apply to wound bed as instructed Secondary Dressing Optifoam Non-Adhesive Dressing, 4x4 in Discharge Instruction: Apply over primary dressing or foam border as directed. Secured With Compression Wrap Compression Stockings Add-Ons Electronic Signature(s) Signed: 12/15/2022 4:20:59 PM By: Brenton Grills Entered By: Brenton Grills on 12/12/2022 15:07:23 Sivertsen, Hunt ([id number])  [id number] Page 9 of 9 -------------------------------------------------------------------------------- Vitals Details Patient Name: Date of Service: [name] [name] 12/12/2022 2:00 PM Medical Record Number: [id number] Patient Account Number: [id number] Date of Birth/Sex: Treating RN: 14-Oct-1933 (89 y.o. [name] Primary Care Viliami Bracco: [name] Other Clinician: Referring Donnalynn Wheeless: Treating Sharlot Sturkey/Extender: [name] in Treatment: 23 Vital Signs Time Taken: 14:15 Temperature (F): 97.8 Height (in): 71 Pulse (bpm): 99 Weight (lbs): 170 Respiratory Rate (breaths/min): 18 Body Mass Index (BMI): 23.7 Blood Pressure (mmHg): 122/83 Capillary Blood Glucose (mg/dl): 093 Reference Range: 80 - 120 mg / dl Electronic Signature(s) Signed: 12/15/2022 4:20:59 PM By: Brenton Grills Entered By: Brenton Grills on 12/12/2022 14:17:03  and as needed Assess ulceration(s) every visit Notes: Electronic Signature(s) Signed: 12/15/2022 4:20:59 PM By: Brenton Grills Entered By: Brenton Grills on 12/12/2022 14:28:10 -------------------------------------------------------------------------------- Pain Assessment Details Patient Name: Date of Service: [name] [name] 12/12/2022 2:00 PM Medical Record Number: [id number] Patient Account Number:  [id number] Date of Birth/Sex: Treating RN: October 03, 1933 (89 y.o. [name] Primary Care Shion Bluestein: [name] Other Clinician: Referring Egan Sahlin: Treating Erendira Crabtree/Extender: [name] in Treatment: [address] Freeman, Daquavion ([id number]) [id number] Page 5 of 9 Location of Pain Severity and Description of Pain Patient Has Paino No Site Locations Pain Management and Medication Current Pain Management: Electronic Signature(s) Signed: 12/15/2022 4:20:59 PM By: Brenton Grills Entered By: Brenton Grills on 12/12/2022 14:17:11 -------------------------------------------------------------------------------- Patient/Caregiver Education Details Patient Name: Date of Service: [name] [name] 10/7/2024andnbsp2:00 PM Medical Record Number: [id number] Patient Account Number: [id number] Date of Birth/Gender: Treating RN: Jul 28, 1933 (89 y.o. [name] Primary Care Physician: [name] Other Clinician: Referring Physician: Treating Physician/Extender: [name] in Treatment: 23 Education Assessment Education Provided To: Patient and Caregiver Education Topics Provided Wound/Skin Impairment: Methods: Explain/Verbal Responses: State content correctly Electronic Signature(s) Signed: 12/15/2022 4:20:59 PM By: Brenton Grills Entered By: Brenton Grills on 12/12/2022 14:30:57 -------------------------------------------------------------------------------- Wound Assessment Details Patient Name: Date of Service: [name] [name] 12/12/2022 2:00 PM Thurner, Offie ([id number]) [id number] Page 6 of 9 Medical Record Number: [id number] Patient Account Number: [id number] Date of Birth/Sex: Treating RN: [birth year] (89 y.o. [name] Primary Care Dyllon Henken: [name] Other Clinician: Referring Thomasene Dubow: Treating Saylee Sherrill/Extender:  [name] in Treatment: 23 Wound Status Wound Number: 3 Primary Diabetic Wound/Ulcer of the Lower Extremity Etiology: Wound Location: Right Amputation Site - Toe Wound Open Wounding Event: Gradually Appeared Status: Date Acquired: 04/07/2022 Comorbid Cataracts, Anemia, Asthma, Chronic Obstructive Pulmonary Weeks Of Treatment: 23 History: Disease (COPD), Peripheral Arterial Disease, Type II Diabetes, Clustered Wound: No Gout, Osteoarthritis, Neuropathy Photos Wound Measurements Length: (cm) 0.5 Width: (cm) 0.2 Depth: (cm) 0.2 Area: (cm) 0.079 Volume: (cm) 0.016 % Reduction in Area: 97.5% % Reduction in Volume: 98.3% Epithelialization: Medium (34-66%) Tunneling: No Undermining: No Wound Description Classification: Grade 1 Wound Margin: Epibole Exudate Amount: Medium Exudate Type: Serosanguineous Exudate Color: red, brown Foul Odor After Cleansing: No Slough/Fibrino No Wound Bed Granulation Amount: Large (67-100%) Exposed Structure Granulation Quality: Red, Pink Fascia Exposed: No Necrotic Amount: None Present (0%) Fat Layer (Subcutaneous Tissue) Exposed: Yes Tendon Exposed: No Muscle Exposed: No Joint Exposed: No Bone Exposed: No Periwound Skin Texture Texture Color No Abnormalities Noted: No No Abnormalities Noted: No Callus: No Atrophie Blanche: No Crepitus: No Cyanosis: No Excoriation: No Ecchymosis: No Induration: No Erythema: No Rash: No Hemosiderin Staining: No Scarring: No Mottled: No Pallor: No Moisture Rubor: No No Abnormalities Noted: No Dry / Scaly: No Temperature / Pain Maceration: No Temperature: No Abnormality Treatment Notes Wound #3 (Amputation Site - Toe) Wound Laterality: Right Cleanser Vashe 5.8 (oz) Discharge Instruction: Cleanse the wound with Vashe prior to applying a clean dressing using gauze sponges, not tissue or cotton balls. FORTIN, Harim ([id number]) [id number]  Page 7 of 9 Peri-Wound Care Topical Skintegrity Hydrogel 4 (oz) Discharge Instruction: Apply hydrogel as directed Primary Dressing Endoform 2x2 in Discharge Instruction: Moisten with saline Secondary Dressing Woven Gauze Sponge, Non-Sterile 4x4 in Discharge Instruction: Apply over primary dressing as directed. Secured With Paper Tape, 2x10 (in/yd) Discharge Instruction: Secure dressing with tape as directed. tubigrip size D Discharge Instruction: if needed -double  [name], [name] ([id number]) [id number] Page 1 of 9 Visit Report for 12/12/2022 Arrival Information Details Patient Name: Date of Service: [name] [name] 12/12/2022 2:00 PM Medical Record Number: [id number] Patient Account Number: [id number] Date of Birth/Sex: Treating RN: 15-Apr-1933 (89 y.o. [name] Primary Care Chamara Dyck: [name] Other Clinician: Referring Deedee Lybarger: Treating Areeb Corron/Extender: [name] in Treatment: 23 Visit Information History Since Last Visit All ordered tests and consults were completed: Yes Patient Arrived: Ambulatory Added or deleted any medications: No Arrival Time: 14:13 Any new allergies or adverse reactions: No Accompanied By: daughter Had a fall or experienced change in No Transfer Assistance: None activities of daily living that may affect Patient Requires Transmission-Based Precautions: No risk of falls: Patient Has Alerts: Yes Signs or symptoms of abuse/neglect since last visito No Patient Alerts: Patient on Blood Thinner Hospitalized since last visit: No Implantable device outside of the clinic excluding No cellular tissue based products placed in the center since last visit: Has Dressing in Place as Prescribed: Yes Pain Present Now: No Electronic Signature(s) Signed: 12/15/2022 4:20:59 PM By: Brenton Grills Entered By: Brenton Grills on 12/12/2022 14:14:53 -------------------------------------------------------------------------------- Encounter Discharge Information Details Patient Name: Date of Service: [name] [name] 12/12/2022 2:00 PM Medical Record Number: [id number] Patient Account Number: [id number] Date of Birth/Sex: Treating RN: [birth year] (89 y.o. [name] Primary Care Frayda Egley: [name] Other Clinician: Referring Tilley Faeth: Treating Amaziah Ghosh/Extender: [name] in Treatment: 23 Encounter Discharge  Information Items Post Procedure Vitals Discharge Condition: Stable Temperature (F): 98 Ambulatory Status: Ambulatory Pulse (bpm): 80 Discharge Destination: Home Respiratory Rate (breaths/min): 18 Transportation: Private Auto Blood Pressure (mmHg): 118/74 Accompanied By: daughter Schedule Follow-up Appointment: Yes Clinical Summary of Care: Patient Declined Electronic Signature(s) Signed: 12/15/2022 4:20:59 PM By: Brenton Grills Entered By: Brenton Grills on 12/12/2022 15:21:53 Laprade, Wiley ([id number]) [id number] Page 2 of 9 -------------------------------------------------------------------------------- Lower Extremity Assessment Details Patient Name: Date of Service: [name] [name] 12/12/2022 2:00 PM Medical Record Number: [id number] Patient Account Number: [id number] Date of Birth/Sex: Treating RN: February 05, 1934 (89 y.o. [name] Primary Care Stetson Pelaez: [name] Other Clinician: Referring Nolawi Kanady: Treating Briell Paulette/Extender: [name] in Treatment: 23 Edema Assessment Assessed: [name]: No] Franne Forts: No] Edema: [Left: N] [Right: o] Calf Left: Right: Point of Measurement: From Medial Instep 31 cm Ankle Left: Right: Point of Measurement: From Medial Instep 21 cm Vascular Assessment Pulses: Dorsalis Pedis Palpable: [Right:Yes] Extremity colors, hair growth, and conditions: Extremity Color: [Right:Normal] Hair Growth on Extremity: [Right:No] Temperature of Extremity: [Right:Warm] Capillary Refill: [Right:< 3 seconds] Dependent Rubor: [Right:No No] Toe Nail Assessment Left: Right: Thick: Yes Discolored: Yes Deformed: Yes Improper Length and Hygiene: Yes Electronic Signature(s) Signed: 12/15/2022 4:20:59 PM By: Brenton Grills Entered By: Brenton Grills on 12/12/2022 14:18:58 -------------------------------------------------------------------------------- Multi Wound Chart Details Patient  Name: Date of Service: [name] [name] 12/12/2022 2:00 PM Medical Record Number: [id number] Patient Account Number: [id number] Date of Birth/Sex: Treating RN: 12-21-1933 (89 y.o. M) Primary Care Tracker Mance: [name] Other Clinician: Referring Bilal Manzer: Treating Cassandria Drew/Extender: [name] in Treatment: 23 Vital Signs Height(in): 71 Capillary Blood Glucose(mg/dl): 601 Weight(lbs): 093 Pulse(bpm): 99 Body Mass Index(BMI): 23.7 Blood Pressure(mmHg): 122/83 Clanton, Jasten ([id number]) [id number] Page 3 of 9 Temperature(F): 97.8 Respiratory Rate(breaths/min): 18 [3:Photos:] [N/A:N/A] Right Amputation Site - Toe Right, Lateral Foot N/A Wound Location: Gradually Appeared Shear/Friction N/A Wounding Event: Diabetic Wound/Ulcer of the Lower Diabetic Wound/Ulcer of the Lower N/A Primary Etiology:

## 2022-12-19 ENCOUNTER — Encounter (HOSPITAL_BASED_OUTPATIENT_CLINIC_OR_DEPARTMENT_OTHER): Payer: PPO | Admitting: Internal Medicine

## 2022-12-19 DIAGNOSIS — E11621 Type 2 diabetes mellitus with foot ulcer: Secondary | ICD-10-CM | POA: Diagnosis not present

## 2022-12-19 NOTE — Progress Notes (Signed)
When he was here last time he had a blister/DTI on the lateral aspect of the fifth MTP 9/30; patient has a wound on the right foot amputation site of his third toe. Probably in the third fourth webspace. I changed him to endoform last week. The wound looks somewhat better The area over the lateral part of the fifth metatarsal head which was blistered with some central deep tissue looking injury was leaking fluid today. The blister was unroofed. I used pickups and scissors to remove this. I do not know that there is anything technically open here although there is certainly some central tissue at risk 10/7; his original wound at the amputation site in his third fourth webspace looks stable to improved. We have been using endoform. The real concern today is the area over the lateral part of the fifth metatarsal head. This initially looked like a blister with central deep tissue injury I unroofed the blister last week there was nothing technically open but the deep tissue injury was concerning. [name] oday he comes in with a necrotic surface I remove this. The central part of this area which I am assuming was the deep tissue injury is necrotic I removed some of the necrotic tissue with a #15 scalpel and pickups. There is undermining around the wound edge here. 10/14; his original wound at the amputation site of his third fourth toe webspace looks improved we have gradually been making progress here. Using endoform. Unfortunately the area over the lateral part of the right fifth metatarsal head continues to deteriorate. Necrotic surface last  week continues this week. The PCR culture I did last week did not manage to make it to the right lab. I have looked at his x-ray the bone underneath does not look that bad to me however we have not yet had a radiologist interpretation. We have been using Iodoflex this week. Electronic Signature(s) Signed: 12/19/2022 4:41:28 PM By: [name] MD Entered By: [name] on 12/19/2022 13:05:59 -------------------------------------------------------------------------------- Physical Exam Details Patient Name: Date of Service: [name] 12/19/2022 2:45 PM Medical Record Number: [id number] Patient Account Number: [id number] Date of Birth/Sex: Treating RN: 1933-12-19 (89 y.o. M) Primary Care Provider: [name] Other Clinician: Referring Provider: Treating Provider/Extender: [name] in Treatment: 24 Notes Wound exam; the third toe amputation site requires debridement of very adherent surface slough but after this it really looks quite healthy healthy looking granulation. Unfortunately the right fifth metatarsal head appears necrotic. Very difficult to remove this tissue. Underneath I think there is exposed bone. I obtained another specimen of tissue for PCR culture. There is no erythema around the wound but the wound itself is completely nonviable Electronic Signature(s) Signed: 12/19/2022 4:41:28 PM By: [name] MD [name], [name] ([id number]) [id number] Page 4 of 9 Entered By: [name] on 12/19/2022 13:07:01 -------------------------------------------------------------------------------- Physician Orders Details Patient Name: Date of Service: [name] 12/19/2022 2:45 PM Medical Record Number: [id number] Patient Account Number: [id number] Date of Birth/Sex: Treating RN: 1933/07/01 (89 y.o. [name] Primary Care Provider: [name] Other Clinician: Referring  Provider: Treating Provider/Extender: [name] in Treatment: 24 The following information was scribed by: Shawn Stall The information was scribed for: [name] Verbal / Phone Orders: No Diagnosis Coding ICD-10 Coding Code Description L97.512 Non-pressure chronic ulcer of other part of right foot with fat layer exposed E11.621 Type 2 diabetes mellitus with foot ulcer I70.235 Atherosclerosis of native arteries of right leg  Medical Record Number: [id number] Patient Account Number: [id number] Date of Birth/Sex: Treating RN: December 19, 1933 (89 y.o. [name] Primary Care Provider: [name] Other Clinician: Referring Provider: Treating Provider/Extender: [name] in Treatment: 24 Active Problems ICD-10 Encounter Code Description Active Date MDM Diagnosis [phone] Non-pressure chronic ulcer of other part of right foot with fat layer exposed 07/04/2022 No Yes E11.621 Type 2 diabetes mellitus with foot ulcer 07/04/2022 No Yes I70.235 Atherosclerosis of native arteries of right leg with ulceration of other part of 07/04/2022 No Yes foot [name], [name] ([id number]) [phone].pdf Page 6 of 9 I73.9 Peripheral vascular disease, unspecified 07/04/2022 No Yes Inactive Problems Resolved Problems Electronic Signature(s) Signed: 12/19/2022 4:41:28 PM By: [name] MD Entered By: [name] on 12/19/2022 13:02:33 -------------------------------------------------------------------------------- Progress Note Details Patient Name: Date of Service: [name] 12/19/2022 2:45 PM Medical Record Number: [id number] Patient Account Number: [id number] Date of Birth/Sex: Treating RN: May 20, 1933 (89 y.o. M) Primary Care Provider: [name] Other  Clinician: Referring Provider: Treating Provider/Extender: [name] in Treatment: 24 Subjective History of Present Illness (HPI) 07/04/2022 [name] is an 88 year old male with a past medical history of uncontrolled, insulin dependent type 2 diabetes, CKD stage III, and COPD that presents to the clinic for a right foot wound. He has an extensive history of peripheral arterial disease to the right leg. He has had a right superficial femoral to posterior tibial artery bypass by Dr. Elonda Husky in 2020. He had a right common femoral to plantar posterior tibial artery bypass on 02/03/2022 by Dr. Lenell Antu and at that time he also had a right toe amputation. He states this wound site had healed completely. Prior to this he had amputations to the first and second digit to the right foot. He presents today with an open wound to the previous right toe amputation site for the past 2 months. He has been following with vein and vascular for this issue. Most recent ABIs were on 06/22/2022 that showed absent waveforms in the right lower extremity. Currently his right lower extremity bypass is occluded and no plan for another anteriorgram and has he likely has no revascularization options. Currently he has been using Betadine to the wound bed. He denies signs of infection. 07/11/2022 patient arrives in clinic today using Hydrofera Blue on the toe amputation site. The patient is revascularized and is not felt to have any other options for revascularization. We are using Hydrofera Blue and gauze. 5/13; patient presents for follow-up. He has been using Hydrofera Blue to the toe amputation site. He has had no issues. Wound is smaller. He has been approved for [facility] and was agreeable to have this placed in office today. 5/20; patient presents for follow-up. We have been using Kerecis to the wound bed. There is been improvement in healing. Unfortunately patient did not change the  outer dressing for a week. There is slight maceration to the periwound. Unclear why he did not do this. 5/30; patient presents for follow-up. We have been using Kerecis to the wound bed. More maceration to the periwound today. No signs of infection. 6/10; patient presents for follow-up. He has been using Hydrofera Blue to the wound bed. He has no issues or complaints today. Maceration has almost completely resolved. 6/18; patient presents for follow-up. He has been using blast X and collagen to the wound bed without issues. Wound appears smaller today. 6/28; patient presents for follow-up. He has been using blast X and collagen to  directed. 1. I continued with the endoform to the right third toe amputation site 2. We are going to use gent and mupirocin under silver alginate to the area on the right fifth metatarsal head. He may require an MRI. I have empirically given him doxycycline while we await results of the pcr 3 unfortunately his vascular status is very tentative. He has had a previous right superficial femoral to posterior tibial artery bypass with a spliced greater saphenous vein in 2020. Last angiogram 01/21/22 showed an occluded popliteal artery occluded anterior tibioperoneal trunk and peroneal artery. The posterior tibial artery re constitutes above the ankle #4 Dr. Mikey Bussing made the comment he was not eligible for any further interventions although I do not see that specifically stated it certainly would not be out of the realm of possibility. Return back to see Dr. Lenell Antu might also be necessary 5. Might consider a [name] com at this site Electronic Signature(s) Signed: 12/19/2022 4:41:28 PM By: [name] MD Entered By: [name] on 12/19/2022 13:14:04 [name], [name] ([id number]) [id number] Page 9 of 9 -------------------------------------------------------------------------------- SuperBill Details Patient Name: Date of Service: [name] 12/19/2022 Medical Record Number: [id number] Patient Account Number: [id number] Date of Birth/Sex: Treating RN: [birth year] (89 y.o. [name] Primary Care Provider: [name] Other Clinician: Referring Provider: Treating Provider/Extender: [name] in Treatment: 24 Diagnosis Coding ICD-10 Codes Code Description [phone] Non-pressure chronic ulcer of other part of right foot with fat layer exposed E11.621 Type 2 diabetes mellitus with foot ulcer I70.235 Atherosclerosis of native arteries of right leg with ulceration of other part of foot I73.9 Peripheral vascular disease, unspecified Facility Procedures : CPT4 Code: [id number] Description: 11042 - DEB SUBQ TISSUE 20 SQ CM/< ICD-10 Diagnosis Description L97.512 Non-pressure chronic ulcer of other part of right foot with fat layer exposed E11.621 Type 2 diabetes mellitus with foot ulcer Modifier: Quantity: 1 Physician Procedures : CPT4 Code Description Modifier [id number] 11042 - WC PHYS SUBQ TISS 20 SQ CM ICD-10 Diagnosis Description L97.512 Non-pressure chronic ulcer of other part of right foot with fat layer exposed E11.621 Type 2 diabetes mellitus with foot ulcer Quantity: 1 Electronic Signature(s) Signed: 12/19/2022 4:41:28 PM By: [name] MD Entered By: [name] on 12/19/2022 13:14:23  When he was here last time he had a blister/DTI on the lateral aspect of the fifth MTP 9/30; patient has a wound on the right foot amputation site of his third toe. Probably in the third fourth webspace. I changed him to endoform last week. The wound looks somewhat better The area over the lateral part of the fifth metatarsal head which was blistered with some central deep tissue looking injury was leaking fluid today. The blister was unroofed. I used pickups and scissors to remove this. I do not know that there is anything technically open here although there is certainly some central tissue at risk 10/7; his original wound at the amputation site in his third fourth webspace looks stable to improved. We have been using endoform. The real concern today is the area over the lateral part of the fifth metatarsal head. This initially looked like a blister with central deep tissue injury I unroofed the blister last week there was nothing technically open but the deep tissue injury was concerning. [name] oday he comes in with a necrotic surface I remove this. The central part of this area which I am assuming was the deep tissue injury is necrotic I removed some of the necrotic tissue with a #15 scalpel and pickups. There is undermining around the wound edge here. 10/14; his original wound at the amputation site of his third fourth toe webspace looks improved we have gradually been making progress here. Using endoform. Unfortunately the area over the lateral part of the right fifth metatarsal head continues to deteriorate. Necrotic surface last  week continues this week. The PCR culture I did last week did not manage to make it to the right lab. I have looked at his x-ray the bone underneath does not look that bad to me however we have not yet had a radiologist interpretation. We have been using Iodoflex this week. Electronic Signature(s) Signed: 12/19/2022 4:41:28 PM By: [name] MD Entered By: [name] on 12/19/2022 13:05:59 -------------------------------------------------------------------------------- Physical Exam Details Patient Name: Date of Service: [name] 12/19/2022 2:45 PM Medical Record Number: [id number] Patient Account Number: [id number] Date of Birth/Sex: Treating RN: 1933-12-19 (89 y.o. M) Primary Care Provider: [name] Other Clinician: Referring Provider: Treating Provider/Extender: [name] in Treatment: 24 Notes Wound exam; the third toe amputation site requires debridement of very adherent surface slough but after this it really looks quite healthy healthy looking granulation. Unfortunately the right fifth metatarsal head appears necrotic. Very difficult to remove this tissue. Underneath I think there is exposed bone. I obtained another specimen of tissue for PCR culture. There is no erythema around the wound but the wound itself is completely nonviable Electronic Signature(s) Signed: 12/19/2022 4:41:28 PM By: [name] MD [name], [name] ([id number]) [id number] Page 4 of 9 Entered By: [name] on 12/19/2022 13:07:01 -------------------------------------------------------------------------------- Physician Orders Details Patient Name: Date of Service: [name] 12/19/2022 2:45 PM Medical Record Number: [id number] Patient Account Number: [id number] Date of Birth/Sex: Treating RN: 1933/07/01 (89 y.o. [name] Primary Care Provider: [name] Other Clinician: Referring  Provider: Treating Provider/Extender: [name] in Treatment: 24 The following information was scribed by: Shawn Stall The information was scribed for: [name] Verbal / Phone Orders: No Diagnosis Coding ICD-10 Coding Code Description L97.512 Non-pressure chronic ulcer of other part of right foot with fat layer exposed E11.621 Type 2 diabetes mellitus with foot ulcer I70.235 Atherosclerosis of native arteries of right leg  directed. 1. I continued with the endoform to the right third toe amputation site 2. We are going to use gent and mupirocin under silver alginate to the area on the right fifth metatarsal head. He may require an MRI. I have empirically given him doxycycline while we await results of the pcr 3 unfortunately his vascular status is very tentative. He has had a previous right superficial femoral to posterior tibial artery bypass with a spliced greater saphenous vein in 2020. Last angiogram 01/21/22 showed an occluded popliteal artery occluded anterior tibioperoneal trunk and peroneal artery. The posterior tibial artery re constitutes above the ankle #4 Dr. Mikey Bussing made the comment he was not eligible for any further interventions although I do not see that specifically stated it certainly would not be out of the realm of possibility. Return back to see Dr. Lenell Antu might also be necessary 5. Might consider a [name] com at this site Electronic Signature(s) Signed: 12/19/2022 4:41:28 PM By: [name] MD Entered By: [name] on 12/19/2022 13:14:04 [name], [name] ([id number]) [id number] Page 9 of 9 -------------------------------------------------------------------------------- SuperBill Details Patient Name: Date of Service: [name] 12/19/2022 Medical Record Number: [id number] Patient Account Number: [id number] Date of Birth/Sex: Treating RN: [birth year] (89 y.o. [name] Primary Care Provider: [name] Other Clinician: Referring Provider: Treating Provider/Extender: [name] in Treatment: 24 Diagnosis Coding ICD-10 Codes Code Description [phone] Non-pressure chronic ulcer of other part of right foot with fat layer exposed E11.621 Type 2 diabetes mellitus with foot ulcer I70.235 Atherosclerosis of native arteries of right leg with ulceration of other part of foot I73.9 Peripheral vascular disease, unspecified Facility Procedures : CPT4 Code: [id number] Description: 11042 - DEB SUBQ TISSUE 20 SQ CM/< ICD-10 Diagnosis Description L97.512 Non-pressure chronic ulcer of other part of right foot with fat layer exposed E11.621 Type 2 diabetes mellitus with foot ulcer Modifier: Quantity: 1 Physician Procedures : CPT4 Code Description Modifier [id number] 11042 - WC PHYS SUBQ TISS 20 SQ CM ICD-10 Diagnosis Description L97.512 Non-pressure chronic ulcer of other part of right foot with fat layer exposed E11.621 Type 2 diabetes mellitus with foot ulcer Quantity: 1 Electronic Signature(s) Signed: 12/19/2022 4:41:28 PM By: [name] MD Entered By: [name] on 12/19/2022 13:14:23  When he was here last time he had a blister/DTI on the lateral aspect of the fifth MTP 9/30; patient has a wound on the right foot amputation site of his third toe. Probably in the third fourth webspace. I changed him to endoform last week. The wound looks somewhat better The area over the lateral part of the fifth metatarsal head which was blistered with some central deep tissue looking injury was leaking fluid today. The blister was unroofed. I used pickups and scissors to remove this. I do not know that there is anything technically open here although there is certainly some central tissue at risk 10/7; his original wound at the amputation site in his third fourth webspace looks stable to improved. We have been using endoform. The real concern today is the area over the lateral part of the fifth metatarsal head. This initially looked like a blister with central deep tissue injury I unroofed the blister last week there was nothing technically open but the deep tissue injury was concerning. [name] oday he comes in with a necrotic surface I remove this. The central part of this area which I am assuming was the deep tissue injury is necrotic I removed some of the necrotic tissue with a #15 scalpel and pickups. There is undermining around the wound edge here. 10/14; his original wound at the amputation site of his third fourth toe webspace looks improved we have gradually been making progress here. Using endoform. Unfortunately the area over the lateral part of the right fifth metatarsal head continues to deteriorate. Necrotic surface last  week continues this week. The PCR culture I did last week did not manage to make it to the right lab. I have looked at his x-ray the bone underneath does not look that bad to me however we have not yet had a radiologist interpretation. We have been using Iodoflex this week. Electronic Signature(s) Signed: 12/19/2022 4:41:28 PM By: [name] MD Entered By: [name] on 12/19/2022 13:05:59 -------------------------------------------------------------------------------- Physical Exam Details Patient Name: Date of Service: [name] 12/19/2022 2:45 PM Medical Record Number: [id number] Patient Account Number: [id number] Date of Birth/Sex: Treating RN: 1933-12-19 (89 y.o. M) Primary Care Provider: [name] Other Clinician: Referring Provider: Treating Provider/Extender: [name] in Treatment: 24 Notes Wound exam; the third toe amputation site requires debridement of very adherent surface slough but after this it really looks quite healthy healthy looking granulation. Unfortunately the right fifth metatarsal head appears necrotic. Very difficult to remove this tissue. Underneath I think there is exposed bone. I obtained another specimen of tissue for PCR culture. There is no erythema around the wound but the wound itself is completely nonviable Electronic Signature(s) Signed: 12/19/2022 4:41:28 PM By: [name] MD [name], [name] ([id number]) [id number] Page 4 of 9 Entered By: [name] on 12/19/2022 13:07:01 -------------------------------------------------------------------------------- Physician Orders Details Patient Name: Date of Service: [name] 12/19/2022 2:45 PM Medical Record Number: [id number] Patient Account Number: [id number] Date of Birth/Sex: Treating RN: 1933/07/01 (89 y.o. [name] Primary Care Provider: [name] Other Clinician: Referring  Provider: Treating Provider/Extender: [name] in Treatment: 24 The following information was scribed by: Shawn Stall The information was scribed for: [name] Verbal / Phone Orders: No Diagnosis Coding ICD-10 Coding Code Description L97.512 Non-pressure chronic ulcer of other part of right foot with fat layer exposed E11.621 Type 2 diabetes mellitus with foot ulcer I70.235 Atherosclerosis of native arteries of right leg  directed. 1. I continued with the endoform to the right third toe amputation site 2. We are going to use gent and mupirocin under silver alginate to the area on the right fifth metatarsal head. He may require an MRI. I have empirically given him doxycycline while we await results of the pcr 3 unfortunately his vascular status is very tentative. He has had a previous right superficial femoral to posterior tibial artery bypass with a spliced greater saphenous vein in 2020. Last angiogram 01/21/22 showed an occluded popliteal artery occluded anterior tibioperoneal trunk and peroneal artery. The posterior tibial artery re constitutes above the ankle #4 Dr. Mikey Bussing made the comment he was not eligible for any further interventions although I do not see that specifically stated it certainly would not be out of the realm of possibility. Return back to see Dr. Lenell Antu might also be necessary 5. Might consider a [name] com at this site Electronic Signature(s) Signed: 12/19/2022 4:41:28 PM By: [name] MD Entered By: [name] on 12/19/2022 13:14:04 [name], [name] ([id number]) [id number] Page 9 of 9 -------------------------------------------------------------------------------- SuperBill Details Patient Name: Date of Service: [name] 12/19/2022 Medical Record Number: [id number] Patient Account Number: [id number] Date of Birth/Sex: Treating RN: [birth year] (89 y.o. [name] Primary Care Provider: [name] Other Clinician: Referring Provider: Treating Provider/Extender: [name] in Treatment: 24 Diagnosis Coding ICD-10 Codes Code Description [phone] Non-pressure chronic ulcer of other part of right foot with fat layer exposed E11.621 Type 2 diabetes mellitus with foot ulcer I70.235 Atherosclerosis of native arteries of right leg with ulceration of other part of foot I73.9 Peripheral vascular disease, unspecified Facility Procedures : CPT4 Code: [id number] Description: 11042 - DEB SUBQ TISSUE 20 SQ CM/< ICD-10 Diagnosis Description L97.512 Non-pressure chronic ulcer of other part of right foot with fat layer exposed E11.621 Type 2 diabetes mellitus with foot ulcer Modifier: Quantity: 1 Physician Procedures : CPT4 Code Description Modifier [id number] 11042 - WC PHYS SUBQ TISS 20 SQ CM ICD-10 Diagnosis Description L97.512 Non-pressure chronic ulcer of other part of right foot with fat layer exposed E11.621 Type 2 diabetes mellitus with foot ulcer Quantity: 1 Electronic Signature(s) Signed: 12/19/2022 4:41:28 PM By: [name] MD Entered By: [name] on 12/19/2022 13:14:23  directed. 1. I continued with the endoform to the right third toe amputation site 2. We are going to use gent and mupirocin under silver alginate to the area on the right fifth metatarsal head. He may require an MRI. I have empirically given him doxycycline while we await results of the pcr 3 unfortunately his vascular status is very tentative. He has had a previous right superficial femoral to posterior tibial artery bypass with a spliced greater saphenous vein in 2020. Last angiogram 01/21/22 showed an occluded popliteal artery occluded anterior tibioperoneal trunk and peroneal artery. The posterior tibial artery re constitutes above the ankle #4 Dr. Mikey Bussing made the comment he was not eligible for any further interventions although I do not see that specifically stated it certainly would not be out of the realm of possibility. Return back to see Dr. Lenell Antu might also be necessary 5. Might consider a [name] com at this site Electronic Signature(s) Signed: 12/19/2022 4:41:28 PM By: [name] MD Entered By: [name] on 12/19/2022 13:14:04 [name], [name] ([id number]) [id number] Page 9 of 9 -------------------------------------------------------------------------------- SuperBill Details Patient Name: Date of Service: [name] 12/19/2022 Medical Record Number: [id number] Patient Account Number: [id number] Date of Birth/Sex: Treating RN: [birth year] (89 y.o. [name] Primary Care Provider: [name] Other Clinician: Referring Provider: Treating Provider/Extender: [name] in Treatment: 24 Diagnosis Coding ICD-10 Codes Code Description [phone] Non-pressure chronic ulcer of other part of right foot with fat layer exposed E11.621 Type 2 diabetes mellitus with foot ulcer I70.235 Atherosclerosis of native arteries of right leg with ulceration of other part of foot I73.9 Peripheral vascular disease, unspecified Facility Procedures : CPT4 Code: [id number] Description: 11042 - DEB SUBQ TISSUE 20 SQ CM/< ICD-10 Diagnosis Description L97.512 Non-pressure chronic ulcer of other part of right foot with fat layer exposed E11.621 Type 2 diabetes mellitus with foot ulcer Modifier: Quantity: 1 Physician Procedures : CPT4 Code Description Modifier [id number] 11042 - WC PHYS SUBQ TISS 20 SQ CM ICD-10 Diagnosis Description L97.512 Non-pressure chronic ulcer of other part of right foot with fat layer exposed E11.621 Type 2 diabetes mellitus with foot ulcer Quantity: 1 Electronic Signature(s) Signed: 12/19/2022 4:41:28 PM By: [name] MD Entered By: [name] on 12/19/2022 13:14:23  [name], [name] ([id number]) [id number] Page 1 of 9 Visit Report for 12/19/2022 Debridement Details Patient Name: Date of Service: [name] 12/19/2022 2:45 PM Medical Record Number: [id number] Patient Account Number: [id number] Date of Birth/Sex: Treating RN: 1933-12-29 (89 y.o. M) Primary Care Provider: [name] Other Clinician: Referring Provider: Treating Provider/Extender: [name] in Treatment: 24 Debridement Performed for Assessment: Wound #3 Right Amputation Site - Toe Performed By: Physician Maxwell Caul., MD The following information was scribed by: Shawn Stall The information was scribed for: [name] Debridement Type: Debridement Severity of Tissue Pre Debridement: Fat layer exposed Level of Consciousness (Pre-procedure): Awake and Alert Pre-procedure Verification/Time Out Yes - 15:20 Taken: Start Time: 15:21 Pain Control: Lidocaine 4% [name] opical Solution Percent of Wound Bed Debrided: 100% [name] Area Debrided (cm): otal 1.18 Tissue and other material debrided: Viable, Non-Viable, Callus, Slough, Subcutaneous, Skin: Epidermis, Slough Level: Skin/Subcutaneous Tissue Debridement Description: Excisional Instrument: Blade Specimen: Swab, Number of Specimens [name] aken: 1 Bleeding: Moderate Hemostasis Achieved: Pressure End Time: 15:27 Procedural Pain: 0 Post Procedural Pain: 0 Response to Treatment: Procedure was tolerated well Level of Consciousness (Post- Awake and Alert procedure): Post Debridement Measurements of Total Wound Length: (cm) 1.5 Width: (cm) 1 Depth: (cm) 0.1 Volume: (cm) 0.118 Character of Wound/Ulcer Post Debridement: Requires Further Debridement Severity of Tissue Post Debridement: Fat layer exposed Post Procedure Diagnosis Same as Pre-procedure Electronic Signature(s) Signed: 12/19/2022 4:41:28 PM By: [name] MD Entered By: [name] on  12/19/2022 13:02:58 -------------------------------------------------------------------------------- Debridement Details Patient Name: Date of Service: [name] 12/19/2022 2:45 PM Medical Record Number: [id number] Patient Account Number: [id number] Date of Birth/Sex: Treating RN: 07-13-1933 (89 y.o. M) Primary Care Provider: [name] Other Clinician: TRAYCE, [name] ([id number]) [id number] Page 2 of 9 Referring Provider: Treating Provider/Extender: [name] in Treatment: 24 Debridement Performed for Assessment: Wound #4 Right,Lateral Foot Performed By: Physician Maxwell Caul., MD The following information was scribed by: Shawn Stall The information was scribed for: [name] Debridement Type: Debridement Severity of Tissue Pre Debridement: Fat layer exposed Level of Consciousness (Pre-procedure): Awake and Alert Pre-procedure Verification/Time Out Yes - 15:20 Taken: Start Time: 15:21 Pain Control: Lidocaine 4% [name] opical Solution Percent of Wound Bed Debrided: 100% [name] Area Debrided (cm): otal 4.08 Tissue and other material debrided: Viable, Non-Viable, Callus, Slough, Subcutaneous, Skin: Epidermis, Slough Level: Skin/Subcutaneous Tissue Debridement Description: Excisional Instrument: Blade Specimen: Swab, Number of Specimens [name] aken: 1 Bleeding: Moderate Hemostasis Achieved: Pressure End Time: 15:27 Procedural Pain: 0 Post Procedural Pain: 0 Response to Treatment: Procedure was tolerated well Level of Consciousness (Post- Awake and Alert procedure): Post Debridement Measurements of Total Wound Length: (cm) 2.6 Width: (cm) 2 Depth: (cm) 0.1 Volume: (cm) 0.408 Character of Wound/Ulcer Post Debridement: Requires Further Debridement Severity of Tissue Post Debridement: Fat layer exposed Post Procedure Diagnosis Same as Pre-procedure Electronic Signature(s) Signed: 12/19/2022  4:41:28 PM By: [name] MD Entered By: [name] on 12/19/2022 13:04:28 -------------------------------------------------------------------------------- HPI Details Patient Name: Date of Service: [name] 12/19/2022 2:45 PM Medical Record Number: [id number] Patient Account Number: [id number] Date of Birth/Sex: Treating RN: [birth year] (89 y.o. M) Primary Care Provider: [name] Other Clinician: Referring Provider: Treating Provider/Extender: [name] in Treatment: 24 History of Present Illness HPI Description: 07/04/2022 Mr. Bomani Oommen is an 89 year old male with a past medical history of uncontrolled, insulin dependent type 2 diabetes, CKD stage III, and COPD that presents to

## 2022-12-21 NOTE — Progress Notes (Signed)
applying a clean dressing using gauze sponges, not tissue or cotton balls. Peri-Wound Care Skin Prep Discharge Instruction: Use skin prep as directed Topical Gentamicin Discharge Instruction: As directed by physician; apply to wound bed. Mupirocin Ointment Discharge Instruction: Apply Mupirocin (Bactroban) apply to wound bed. Primary Dressing IODOFLEX 0.9% Cadexomer Iodine Pad 4x6 cm Discharge Instruction: Apply to wound bed as instructed Secondary Dressing Optifoam Non-Adhesive Dressing, 4x4 in Discharge Instruction: Apply foam donut. Woven Gauze Sponges 2x2 in Discharge Instruction: Apply over primary dressing as directed. Secured With Conforming Stretch Gauze Bandage, Sterile 2x75 (in/in) Discharge Instruction: Secure with stretch gauze as directed. 39M Medipore  H Soft Cloth Surgical T ape, 4 x 10 (in/yd) Discharge Instruction: Secure with tape as directed. Compression Wrap Compression Stockings Add-Ons Electronic Signature(s) Signed: 12/21/2022 4:39:33 PM By: [name] [phone]: [name] [phone].pdf Page 10 of 10 Signed: 12/21/2022 4:39:33 PM Entered By: [name] on 12/19/2022 12:08:19 -------------------------------------------------------------------------------- Vitals Details Patient Name: Date of Service: [name] 12/19/2022 2:45 PM Medical Record Number: [id number] Patient Account Number: [id number] Date of Birth/Sex: Treating RN: [name] (89 y.o. M) Primary Care [name]: [name] Other Clinician: Referring [name]: Treating [name]/Extender: [name] in Treatment: 24 Vital Signs Time Taken: 14:59 Temperature (F): 97.6 Height (in): 71 Pulse (bpm): 91 Weight (lbs): 170 Respiratory Rate (breaths/min): 18 Body Mass Index (BMI): 23.7 Blood Pressure (mmHg): 137/82 Reference Range: 80 - 120 mg / dl Electronic Signature(s) Signed: 12/21/2022 4:39:33 PM By: [name] Entered By: [name] on 12/19/2022 11:59:58  Neuropathy Photos Wound Measurements Length: (cm) 1.5 Width: (cm) 0.5 Depth: (cm) 0.1 Area: (cm) 0.589 Volume: (cm) 0.059 % Reduction in Area: 81.5% % Reduction in Volume: 93.8% Epithelialization: Medium (34-66%) Tunneling: No Undermining: No Wound Description Classification: Grade 1 Wound Margin: Epibole Exudate Amount: Medium Exudate Type: Serosanguineous Exudate Color: red, brown Foul Odor After Cleansing: No Slough/Fibrino No Wound Bed Granulation Amount: Large (67-100%) Exposed Structure Granulation Quality: Red, Pink Fascia Exposed: No Necrotic Amount: Small (1-33%) Fat Layer (Subcutaneous Tissue) Exposed: Yes Necrotic Quality: Adherent Slough Tendon Exposed: No Muscle Exposed: No Joint Exposed: No Bone Exposed: No Periwound Skin Texture Texture Color No Abnormalities Noted: No No Abnormalities Noted: No Callus: No Atrophie Blanche: No Crepitus: No Cyanosis: No Excoriation: No Ecchymosis: No Induration: No Erythema: No Rash: No Hemosiderin Staining: No Scarring: No Mottled: No Pallor: No Moisture Rubor: No No Abnormalities Noted: No Dry / Scaly: No Temperature / Pain Maceration: No Temperature: No Abnormality Treatment Notes Wound #3 (Amputation Site - Toe) Wound Laterality: Right Cleanser Vashe 5.8 (oz) Discharge Instruction: Cleanse the wound with Vashe prior to applying a clean dressing using gauze sponges, not tissue or cotton balls. Peri-Wound Care Topical MURIN, [name] Skintegrity Hydrogel 4 (oz) Discharge Instruction: Apply hydrogel as directed Primary Dressing Endoform 2x2 in Discharge Instruction: Moisten with saline Secondary Dressing Woven Gauze Sponge, Non-Sterile 4x4 in Discharge Instruction: Apply over primary dressing as directed. Secured With Paper Tape, 2x10 (in/yd) Discharge Instruction: Secure dressing with tape as directed. Compression Wrap Compression Stockings Add-Ons Electronic Signature(s) Signed: 12/21/2022 4:39:33 PM By: [name] Entered By: [name] on 12/19/2022 12:06:52 -------------------------------------------------------------------------------- Wound Assessment Details Patient Name: Date of Service: [name] 12/19/2022 2:45 PM Medical Record Number: [id number] Patient Account Number: [id number] Date of Birth/Sex: Treating RN: [name] (89 y.o. M) Primary Care [name]: [name] Other Clinician: Referring [name]: Treating [name]: [name] in Treatment: 24 Wound Status Wound Number: 4 Primary Diabetic Wound/Ulcer of the Lower Extremity Etiology: Wound Location: Right, Lateral Foot Secondary Pressure Ulcer Wounding Event: Shear/Friction Etiology: Date Acquired: 11/21/2022 Wound Open Weeks Of Treatment: 2 Status: Clustered Wound: No Comorbid Cataracts, Anemia, Asthma, Chronic Obstructive Pulmonary History: Disease (COPD), Peripheral Arterial Disease, Type II Diabetes, Gout, Osteoarthritis, Neuropathy Photos Wound Measurements Length: (cm) Width: (cm) Depth: (cm) Area: (cm) Volume: (cm) 2.6 % Reduction in Area: 21.2% 2 % Reduction in Volume: 21.2% 0.1 Epithelialization: Small (1-33%) 4.084 Tunneling: No 0.408 Undermining: No Wound Description [name] ([id number]) Classification: Grade 1 Wound Margin: Distinct, outline attached Exudate Amount:  Medium Exudate Type: Serosanguineous Exudate Color: red, brown [id number] Page 9 of 10 Foul Odor After Cleansing: No Slough/Fibrino No Wound Bed Granulation Amount: Small (1-33%) Exposed Structure Granulation Quality: Red, Pink Fascia Exposed: No Necrotic Amount: Large (67-100%) Fat Layer (Subcutaneous Tissue) Exposed: Yes Necrotic Quality: Eschar, Adherent Slough Tendon Exposed: No Muscle Exposed: No Joint Exposed: No Bone Exposed: No Periwound Skin Texture Texture Color No Abnormalities Noted: No No Abnormalities Noted: No Callus: No Atrophie Blanche: No Crepitus: No Cyanosis: No Excoriation: No Ecchymosis: No Induration: No Erythema: No Rash: No Hemosiderin Staining: No Scarring: No Mottled: No Pallor: No Moisture Rubor: No No Abnormalities Noted: No Dry / Scaly: No Maceration: No Treatment Notes Wound #4 (Foot) Wound Laterality: Right, Lateral Cleanser Soap and Water Discharge Instruction: May shower and wash wound with dial antibacterial soap and water prior to dressing change. Wound Cleanser Discharge Instruction: Cleanse the wound with wound cleanser prior to  LESESNE, Loomis ([id number]) [id number] Page 1 of 10 Visit Report for 12/19/2022 Arrival Information Details Patient Name: Date of Service: [name] 12/19/2022 2:45 PM Medical Record Number: [id number] Patient Account Number: [id number] Date of Birth/Sex: Treating RN: [birth year] (89 y.o. M) Primary Care Yisell Sprunger: [name] Other Clinician: Referring Camilia Caywood: Treating Valecia Beske/Extender: [name] in Treatment: 24 Visit Information History Since Last Visit Added or deleted any medications: Yes Patient Arrived: Ambulatory Any new allergies or adverse reactions: No Arrival Time: 14:46 Had a fall or experienced change in No Accompanied By: daughter activities of daily living that may affect Transfer Assistance: None risk of falls: Patient Identification Verified: Yes Signs or symptoms of abuse/neglect since last visito No Secondary Verification Process Completed: Yes Hospitalized since last visit: No Patient Requires Transmission-Based Precautions: No Implantable device outside of the clinic excluding No Patient Has Alerts: Yes cellular tissue based products placed in the center Patient Alerts: Patient on Blood Thinner since last visit: Has Dressing in Place as Prescribed: Yes Pain Present Now: No Electronic Signature(s) Signed: 12/21/2022 4:39:33 PM By: [name] Entered By: [name] on 12/19/2022 11:50:14 -------------------------------------------------------------------------------- Encounter Discharge Information Details Patient Name: Date of Service: [name] 12/19/2022 2:45 PM Medical Record Number: [id number] Patient Account Number: [id number] Date of Birth/Sex: Treating RN: [birth year] (89 y.o. [name] Primary Care Gilma Bessette: [name] Other Clinician: Referring Ayme Short: Treating Casidy Alberta/Extender: [name] in Treatment:  24 Encounter Discharge Information Items Post Procedure Vitals Discharge Condition: Stable Temperature (F): 97.6 Ambulatory Status: Ambulatory Pulse (bpm): 91 Discharge Destination: Home Respiratory Rate (breaths/min): 20 Transportation: Private Auto Blood Pressure (mmHg): 137/82 Accompanied By: daughter Schedule Follow-up Appointment: Yes Clinical Summary of Care: Electronic Signature(s) Signed: 12/19/2022 5:13:31 PM By: Shawn Stall RN, BSN Entered By: Shawn Stall on 12/19/2022 12:34:32 Noxon, Lucien ([id number]) [id number] Page 2 of 10 -------------------------------------------------------------------------------- Lower Extremity Assessment Details Patient Name: Date of Service: [name] 12/19/2022 2:45 PM Medical Record Number: [id number] Patient Account Number: [id number] Date of Birth/Sex: Treating RN: [name] (89 y.o. M) Primary Care Elayjah Chaney: [name] Other Clinician: Referring Canyon Lohr: Treating Katria Botts/Extender: [name] in Treatment: 24 Edema Assessment Assessed: [name]: No] Franne Forts: No] Edema: [Left: N] [Right: o] Calf Left: Right: Point of Measurement: From Medial Instep 32.5 cm Ankle Left: Right: Point of Measurement: From Medial Instep 21.4 cm Vascular Assessment Extremity colors, hair growth, and conditions: Extremity Color: [Right:Normal] Hair Growth on Extremity: [Right:No] Temperature of Extremity: [Right:Warm] Capillary Refill: [Right:< 3 seconds] Dependent Rubor: [Right:No No] Electronic Signature(s) Signed: 12/21/2022 4:39:33 PM By: [name] Entered By: [name] on 12/19/2022 12:00:09 -------------------------------------------------------------------------------- Multi Wound Chart Details Patient Name: Date of Service: [name] 12/19/2022 2:45 PM Medical Record Number: [id number] Patient Account Number: [id number] Date of Birth/Sex:  Treating RN: [name] (89 y.o. M) Primary Care Justine Cossin: [name] Other Clinician: Referring Shirley Decamp: Treating Wadie Mattie/Extender: [name] in Treatment: 24 Vital Signs Height(in): 71 Pulse(bpm): 91 Weight(lbs): 170 Blood Pressure(mmHg): 137/82 Body Mass Index(BMI): 23.7 Temperature(F): 97.6 Respiratory Rate(breaths/min): 18 [3:Photos:] [N/A:N/A [id number].pdf Page 3 of 10] Right Amputation Site - Toe Right, Lateral Foot N/A Wound Location: Gradually Appeared Shear/Friction N/A Wounding Event: Diabetic Wound/Ulcer of the Lower Diabetic Wound/Ulcer of the Lower N/A Primary Etiology: Extremity Extremity N/A Pressure Ulcer N/A Secondary Etiology: Cataracts, Anemia, Asthma, Chronic Cataracts, Anemia, Asthma, Chronic N/A Comorbid History: Obstructive Pulmonary Disease Obstructive Pulmonary Disease (COPD), Peripheral Arterial Disease, (COPD), Peripheral  Gauze Sponges 2x2 in Discharge Instruction: Apply over primary dressing as directed. Secured With Conforming Stretch Gauze Bandage, Sterile 2x75 (in/in) Discharge Instruction: Secure with stretch gauze as directed. 23M Medipore H Soft Cloth Surgical T ape, 4 x 10 (in/yd) Discharge Instruction: Secure with tape as directed. Compression Wrap Compression Stockings Add-Ons Electronic Signature(s) Signed: 12/19/2022 4:41:28 PM By: [name] MD Entered By: [name] on 12/19/2022 13:02:46 Plotner, Ichiro ([id number]) [id number]  Page 5 of 10 -------------------------------------------------------------------------------- Multi-Disciplinary Care Plan Details Patient Name: Date of Service: [name] 12/19/2022 2:45 PM Medical Record Number: [id number] Patient Account Number: [id number] Date of Birth/Sex: Treating RN: [name] (89 y.o. [name] Primary Care Jeevan Kalla: [name] Other Clinician: Referring Qais Jowers: Treating Kristofor Michalowski/Extender: [name] in Treatment: 24 Active Inactive Wound/Skin Impairment Nursing Diagnoses: Impaired tissue integrity Knowledge deficit related to ulceration/compromised skin integrity Goals: Patient will have a decrease in wound volume by X% from date: (specify in notes) Date Initiated: 07/04/2022 Target Resolution Date: 02/04/2023 Goal Status: Active Patient/caregiver will verbalize understanding of skin care regimen Date Initiated: 07/04/2022 Target Resolution Date: 02/04/2023 Goal Status: Active Ulcer/skin breakdown will have a volume reduction of 30% by week 4 Date Initiated: 07/04/2022 Date Inactivated: 10/27/2022 Target Resolution Date: 11/05/2022 Goal Status: Met Ulcer/skin breakdown will have a volume reduction of 50% by week 8 Date Initiated: 07/04/2022 Date Inactivated: 10/27/2022 Target Resolution Date: 11/05/2022 Goal Status: Met Interventions: Assess patient/caregiver ability to obtain necessary supplies Assess patient/caregiver ability to perform ulcer/skin care regimen upon admission and as needed Assess ulceration(s) every visit Notes: Electronic Signature(s) Signed: 12/19/2022 5:13:31 PM By: Shawn Stall RN, BSN Entered By: Shawn Stall on 12/19/2022 12:21:51 -------------------------------------------------------------------------------- Pain Assessment Details Patient Name: Date of Service: [name] 12/19/2022 2:45 PM Medical Record Number: [id number] Patient Account Number:  [id number] Date of Birth/Sex: Treating RN: [birth year] (89 y.o. M) Primary Care Claudia Alvizo: [name] Other Clinician: Referring Cleora Karnik: Treating Chante Mayson/Extender: [name] in Treatment: 24 Active Problems Location of Pain Severity and Description of Pain Patient Has Paino No Site Locations [place], [place] ([id number]) [id number] Page 6 of 10 Pain Management and Medication Current Pain Management: Electronic Signature(s) Signed: 12/21/2022 4:39:33 PM By: [name] Entered By: [name] on 12/19/2022 11:59:29 -------------------------------------------------------------------------------- Patient/Caregiver Education Details Patient Name: Date of Service: [name] 10/14/2024andnbsp2:45 PM Medical Record Number: [id number] Patient Account Number: [id number] Date of Birth/Gender: Treating RN: [name] (89 y.o. [name] Primary Care Physician: [name] Other Clinician: Referring Physician: Treating Physician/Extender: [name] in Treatment: 24 Education Assessment Education Provided To: Patient Education Topics Provided Wound/Skin Impairment: Handouts: Caring for Your Ulcer Methods: Explain/Verbal Responses: Reinforcements needed Electronic Signature(s) Signed: 12/19/2022 5:13:31 PM By: Shawn Stall RN, BSN Entered By: Shawn Stall on 12/19/2022 12:22:02 -------------------------------------------------------------------------------- Wound Assessment Details Patient Name: Date of Service: [name] 12/19/2022 2:45 PM Medical Record Number: [id number] Patient Account Number: [id number] Date of Birth/Sex: Treating RN: [name] (89 y.o. M) Primary Care Courtney Fenlon: [name] Other Clinician: Ellery Plunk ([id number]) [id number] Page 7 of 10 Referring Denys Salinger: Treating Tamon Parkerson/Extender: [name] in Treatment: 24 Wound Status Wound Number: 3 Primary Diabetic Wound/Ulcer of the Lower Extremity Etiology: Wound Location: Right Amputation Site - Toe Wound Open Wounding Event: Gradually Appeared Status: Date Acquired: 04/07/2022 Comorbid Cataracts, Anemia, Asthma, Chronic Obstructive Pulmonary Weeks Of Treatment: 24 History: Disease (COPD), Peripheral Arterial Disease, Type II Diabetes, Clustered Wound: No Gout, Osteoarthritis,  Gauze Sponges 2x2 in Discharge Instruction: Apply over primary dressing as directed. Secured With Conforming Stretch Gauze Bandage, Sterile 2x75 (in/in) Discharge Instruction: Secure with stretch gauze as directed. 23M Medipore H Soft Cloth Surgical T ape, 4 x 10 (in/yd) Discharge Instruction: Secure with tape as directed. Compression Wrap Compression Stockings Add-Ons Electronic Signature(s) Signed: 12/19/2022 4:41:28 PM By: [name] MD Entered By: [name] on 12/19/2022 13:02:46 Plotner, Ichiro ([id number]) [id number]  Page 5 of 10 -------------------------------------------------------------------------------- Multi-Disciplinary Care Plan Details Patient Name: Date of Service: [name] 12/19/2022 2:45 PM Medical Record Number: [id number] Patient Account Number: [id number] Date of Birth/Sex: Treating RN: [name] (89 y.o. [name] Primary Care Jeevan Kalla: [name] Other Clinician: Referring Qais Jowers: Treating Kristofor Michalowski/Extender: [name] in Treatment: 24 Active Inactive Wound/Skin Impairment Nursing Diagnoses: Impaired tissue integrity Knowledge deficit related to ulceration/compromised skin integrity Goals: Patient will have a decrease in wound volume by X% from date: (specify in notes) Date Initiated: 07/04/2022 Target Resolution Date: 02/04/2023 Goal Status: Active Patient/caregiver will verbalize understanding of skin care regimen Date Initiated: 07/04/2022 Target Resolution Date: 02/04/2023 Goal Status: Active Ulcer/skin breakdown will have a volume reduction of 30% by week 4 Date Initiated: 07/04/2022 Date Inactivated: 10/27/2022 Target Resolution Date: 11/05/2022 Goal Status: Met Ulcer/skin breakdown will have a volume reduction of 50% by week 8 Date Initiated: 07/04/2022 Date Inactivated: 10/27/2022 Target Resolution Date: 11/05/2022 Goal Status: Met Interventions: Assess patient/caregiver ability to obtain necessary supplies Assess patient/caregiver ability to perform ulcer/skin care regimen upon admission and as needed Assess ulceration(s) every visit Notes: Electronic Signature(s) Signed: 12/19/2022 5:13:31 PM By: Shawn Stall RN, BSN Entered By: Shawn Stall on 12/19/2022 12:21:51 -------------------------------------------------------------------------------- Pain Assessment Details Patient Name: Date of Service: [name] 12/19/2022 2:45 PM Medical Record Number: [id number] Patient Account Number:  [id number] Date of Birth/Sex: Treating RN: [birth year] (89 y.o. M) Primary Care Claudia Alvizo: [name] Other Clinician: Referring Cleora Karnik: Treating Chante Mayson/Extender: [name] in Treatment: 24 Active Problems Location of Pain Severity and Description of Pain Patient Has Paino No Site Locations [place], [place] ([id number]) [id number] Page 6 of 10 Pain Management and Medication Current Pain Management: Electronic Signature(s) Signed: 12/21/2022 4:39:33 PM By: [name] Entered By: [name] on 12/19/2022 11:59:29 -------------------------------------------------------------------------------- Patient/Caregiver Education Details Patient Name: Date of Service: [name] 10/14/2024andnbsp2:45 PM Medical Record Number: [id number] Patient Account Number: [id number] Date of Birth/Gender: Treating RN: [name] (89 y.o. [name] Primary Care Physician: [name] Other Clinician: Referring Physician: Treating Physician/Extender: [name] in Treatment: 24 Education Assessment Education Provided To: Patient Education Topics Provided Wound/Skin Impairment: Handouts: Caring for Your Ulcer Methods: Explain/Verbal Responses: Reinforcements needed Electronic Signature(s) Signed: 12/19/2022 5:13:31 PM By: Shawn Stall RN, BSN Entered By: Shawn Stall on 12/19/2022 12:22:02 -------------------------------------------------------------------------------- Wound Assessment Details Patient Name: Date of Service: [name] 12/19/2022 2:45 PM Medical Record Number: [id number] Patient Account Number: [id number] Date of Birth/Sex: Treating RN: [name] (89 y.o. M) Primary Care Courtney Fenlon: [name] Other Clinician: Ellery Plunk ([id number]) [id number] Page 7 of 10 Referring Denys Salinger: Treating Tamon Parkerson/Extender: [name] in Treatment: 24 Wound Status Wound Number: 3 Primary Diabetic Wound/Ulcer of the Lower Extremity Etiology: Wound Location: Right Amputation Site - Toe Wound Open Wounding Event: Gradually Appeared Status: Date Acquired: 04/07/2022 Comorbid Cataracts, Anemia, Asthma, Chronic Obstructive Pulmonary Weeks Of Treatment: 24 History: Disease (COPD), Peripheral Arterial Disease, Type II Diabetes, Clustered Wound: No Gout, Osteoarthritis,

## 2022-12-22 ENCOUNTER — Other Ambulatory Visit: Payer: Self-pay

## 2022-12-22 MED ORDER — APIXABAN 2.5 MG PO TABS: 2.5000 mg | ORAL_TABLET | Freq: Two times a day (BID) | ORAL | 3 refills | Status: DC

## 2022-12-26 ENCOUNTER — Encounter (HOSPITAL_BASED_OUTPATIENT_CLINIC_OR_DEPARTMENT_OTHER): Payer: PPO | Admitting: Internal Medicine

## 2022-12-26 DIAGNOSIS — E11621 Type 2 diabetes mellitus with foot ulcer: Secondary | ICD-10-CM | POA: Diagnosis not present

## 2022-12-29 ENCOUNTER — Encounter: Payer: Self-pay | Admitting: Podiatry

## 2022-12-29 ENCOUNTER — Ambulatory Visit (INDEPENDENT_AMBULATORY_CARE_PROVIDER_SITE_OTHER): Payer: PPO | Admitting: Podiatry

## 2022-12-29 ENCOUNTER — Ambulatory Visit: Payer: PPO

## 2022-12-29 ENCOUNTER — Ambulatory Visit (HOSPITAL_COMMUNITY)
Admission: RE | Admit: 2022-12-29 | Discharge: 2022-12-29 | Disposition: A | Discharge status: Home / Self Care | Payer: PPO | Source: Ambulatory Visit | Attending: Vascular Surgery | Admitting: Vascular Surgery

## 2022-12-29 ENCOUNTER — Other Ambulatory Visit: Payer: Self-pay | Admitting: *Deleted

## 2022-12-29 VITALS — BP 132/88 | HR 107 | Temp 97.9°F | Ht 71.0 in | Wt 172.8 lb

## 2022-12-29 DIAGNOSIS — M79675 Pain in left toe(s): Secondary | ICD-10-CM

## 2022-12-29 DIAGNOSIS — I739 Peripheral vascular disease, unspecified: Secondary | ICD-10-CM | POA: Insufficient documentation

## 2022-12-29 DIAGNOSIS — M79674 Pain in right toe(s): Secondary | ICD-10-CM

## 2022-12-29 DIAGNOSIS — B351 Tinea unguium: Secondary | ICD-10-CM | POA: Diagnosis not present

## 2022-12-29 DIAGNOSIS — Z9889 Other specified postprocedural states: Secondary | ICD-10-CM

## 2022-12-29 DIAGNOSIS — I7025 Atherosclerosis of native arteries of other extremities with ulceration: Secondary | ICD-10-CM | POA: Diagnosis not present

## 2022-12-29 LAB — VAS US ABI WITH/WO TBI
Left ABI: 0.73
Right ABI: 0.4

## 2022-12-29 NOTE — Progress Notes (Signed)
Office Note     CC:  follow up Requesting Provider:  Maxwell Caul, MD  HPI: [name] is a 89 y.o. (10-31-1933) male who presents for wound follow up. Unfortunately [name] has a 3rd toe amputation wound and was found at his last visit in April to have an occluded right lower extremity bypass graft with no further revascularization options. He was suppose to follow up in May but has since been under the management of the wound care center and Dr. Leanord Hawking.   Pt has hx of right common femoral to plantar posterior tibial artery bypass in subcutaneous tunnel with 6 mm PTFE and right third toe amputation on 02/03/2022 by Dr. Lenell Antu. Prior to this he had a right SFA to PT bypass with spliced saphenous vein by Dr. Elonda Husky that was found to be occluded in 2021. This resulted in amputation of 1st and 2nd toes of the right foot for non healing wounds.    Today he presents with his daughter. He reports that about 1 month ago his daughter just happened to notice what appeared to be a quarter sized blood blister on the lateral aspect of his right foot at the 5th metatarsal head. They have continued to follow up with the wound care center every Monday for management but wound has progressed despite local treatment and Dr. Leanord Hawking recommended further evaluation by [place]. There is visible bone in wound bed. Xrays were taken of the foot on 12/12/22 but no final reading by radiologist is reported. He was started on oral Antibiotics but unsure of which and it is not on his medication list. His daughter has been changing the wound dressings daily. The original wound at the 3rd toe amputation site is looking very good and has progressed well with local wound care. He otherwise denies any pain in the leg or foot. He does have decreased sensation in his foot secondary to his diabetic neuropathy. He is without rest pain or tissue loss. He denies any fever or chills.   The pt is on a statin for cholesterol  management.  The pt is on a daily aspirin.   Other AC:  Eliquis The pt is not on medication for hypertension.   The pt is diabetic.  A1c on 10/8 6.7  Tobacco hx:  former   Past Medical History:  Diagnosis Date   ANEMIA, IRON DEF, NOS 01/15/2007   Qualifier: Diagnosis of  By: Constance Goltz MD, Matthew     Arthritis    Asthma    Cataract extraction status of left eye 2016   Cataract extraction status of right eye 2016   CHOLECYSTECTOMY, LAPAROSCOPIC, HX OF 04/12/2007   Qualifier: Diagnosis of  By: Burnadette Pop  MD, Trisha Mangle     COPD (chronic obstructive pulmonary disease) (HCC)    Critical lower limb ischemia (HCC) 10/02/2018   Diabetes mellitus without complication (HCC)    Gout 2007   Hypertension    Neuromuscular disorder (HCC)    Neuropathy   Neuropathy    bilateral feet   Pneumonia    Toe ulcer (HCC) 09/20/2018    Past Surgical History:  Procedure Laterality Date   ABDOMINAL AORTOGRAM W/LOWER EXTREMITY N/A 10/03/2018   Procedure: ABDOMINAL AORTOGRAM W/LOWER EXTREMITY;  Surgeon: Cephus Shelling, MD;  Location: [facility] INVASIVE CV LAB;  Service: Cardiovascular;  Laterality: N/A;  Bilateral    ABDOMINAL AORTOGRAM W/LOWER EXTREMITY N/A 01/21/2022   Procedure: ABDOMINAL AORTOGRAM W/LOWER EXTREMITY;  Surgeon: Leonie Douglas, MD;  Location: [facility] INVASIVE CV  LAB;  Service: Cardiovascular;  Laterality: N/A;   AMPUTATION TOE Right 02/03/2022   Procedure: RIGHT THIRD TOE AMPUTATION;  Surgeon: Leonie Douglas, MD;  Location: [facility] OR;  Service: Vascular;  Laterality: Right;   CHOLECYSTECTOMY     FEMORAL-TIBIAL BYPASS GRAFT Right 02/03/2022   Procedure: RIGHT COMMON FEMORAL TO PEDAL ARTERY BYPASS WITH 6CM POLYTETRAFLUOROETHYLENE GRAFT;  Surgeon: Leonie Douglas, MD;  Location: [facility] OR;  Service: Vascular;  Laterality: Right;   toe ampuation Right     Social History   Socioeconomic History   Marital status: Widowed    Spouse name: Not on file   Number of children: Not on file   Years of  education: 10   Highest education level: 10th grade  Occupational History   Occupation: RetiredHotel manager Work  Tobacco Use   Smoking status: Former    Current packs/day: 0.00    Average packs/day: 0.5 packs/day for 60.3 years (30.1 ttl pk-yrs)    Types: Cigars, Cigarettes    Start date: 03/08/1951    Quit date: 06/08/2011    Years since quitting: 11.5    Passive exposure: Past   Smokeless tobacco: Never  Vaping Use   Vaping status: Never Used  Substance and Sexual Activity   Alcohol use: Not Currently    Comment: Hx of use   Drug use: Yes    Types: Marijuana   Sexual activity: Not Currently  Other Topics Concern   Not on file  Social History Narrative   Patient lives with his daughter and her husband.   Patients wife died in 02/16/19.   Health Care POA:    Emergency Contact: Daughter- Granville Lewis [phone]   End of Life Plan:       Any pets: none   Diet: Patient has a varied diet and reports eating bacon and eggs every morning. Does not like most fruit. Likes flavored water, kool-aid.   No current exercise.     Seatbelts: Pt reports wearing seatbelt when in vehicle.   Hobbies: likes to watch [employer] and rest                Social Determinants of Health   Financial Resource Strain: Low Risk  (09/06/2022)   Overall Financial Resource Strain (CARDIA)    Difficulty of Paying Living Expenses: Not hard at all  Food Insecurity: No Food Insecurity (09/06/2022)   Hunger Vital Sign    Worried About Running Out of Food in the Last Year: Never true    Ran Out of Food in the Last Year: Never true  Transportation Needs: No Transportation Needs (09/06/2022)   PRAPARE - Administrator, Civil Service (Medical): No    Lack of Transportation (Non-Medical): No  Physical Activity: Inactive (09/06/2022)   Exercise Vital Sign    Days of Exercise per Week: 0 days    Minutes of Exercise per Session: 0 min  Stress: No Stress Concern Present (09/06/2022)   [employer] of Occupational Health  - Occupational Stress Questionnaire    Feeling of Stress : Not at all  Social Connections: Socially Isolated (09/06/2022)   Social Connection and Isolation Panel [NHANES]    Frequency of Communication with Friends and Family: More than three times a week    Frequency of Social Gatherings with Friends and Family: Twice a week    Attends Religious Services: Never    Database administrator or Organizations: No    Attends Banker Meetings: Never    Marital  Status: Widowed  Intimate Partner Violence: Not At Risk (09/06/2022)   Humiliation, Afraid, Rape, and Kick questionnaire    Fear of Current or Ex-Partner: No    Emotionally Abused: No    Physically Abused: No    Sexually Abused: No   Family History  Problem Relation Age of Onset   Asthma Mother    Arthritis Sister    Alcohol abuse Sister    Cirrhosis Sister    Asthma Sister    Heart disease Brother    Stroke Brother    Diabetes Brother    Cancer Brother        colon   Stroke Brother    Heart disease Brother    Arthritis Daughter    Asthma Daughter    Diabetes Son     Current Outpatient Medications  Medication Sig Dispense Refill   albuterol (VENTOLIN HFA) 108 (90 Base) MCG/ACT inhaler Inhale 2 puffs into the lungs every 6 (six) hours as needed for wheezing or shortness of breath.     allopurinol (ZYLOPRIM) 100 MG tablet TAKE 1 TABLET BY MOUTH EVERY DAY 90 tablet 1   apixaban (ELIQUIS) 2.5 MG TABS tablet Take 1 tablet (2.5 mg total) by mouth 2 (two) times daily. 60 tablet 3   aspirin 81 MG EC tablet Take 81 mg by mouth daily.     atorvastatin (LIPITOR) 40 MG tablet TAKE 1 TABLET BY MOUTH EVERY DAY 90 tablet 3   blood glucose meter kit and supplies KIT Dispense based on patient and insurance preference. Use up to four times daily as directed. 1 each 0   cycloSPORINE (RESTASIS) 0.05 % ophthalmic emulsion Place 1 drop into both eyes 2 (two) times daily. (Patient taking differently: Place 1 drop into both eyes 2 (two)  times daily as needed (dry eyes).) 60 mL 3   diclofenac Sodium (VOLTAREN) 1 % GEL Apply 4 times daily to affected area 400 g 3   fluticasone-salmeterol (WIXELA INHUB) 250-50 MCG/ACT AEPB INHALE 1 PUFF INTO THE LUNGS 2 (TWO) TIMES DAILY. IN THE MORNING AND AT BEDTIME. 60 each 2   gabapentin (NEURONTIN) 300 MG capsule Take 1 capsule (300 mg total) by mouth daily as needed. 300 mg in the morning, 600 mg at bedtime 90 capsule 1   insulin glargine (LANTUS) 100 UNIT/ML Solostar Pen Inject 5 Units into the skin daily. (Patient not taking: Reported on 12/29/2022) 3 mL 11   Insulin Pen Needle 31G X 5 MM MISC Please use to administer insulin. E11.65 100 each 3   JARDIANCE 25 MG TABS tablet TAKE 1 TABLET (25 MG TOTAL) BY MOUTH DAILY. 90 tablet 3   Lancets (ONETOUCH DELICA PLUS LANCET33G) MISC USE AS DIRECTED UP TO 4 TIMES DAILY AS DIRECTED 100 each 3   levocetirizine (XYZAL) 5 MG tablet TAKE 1 TABLET BY MOUTH TWICE A DAY 180 tablet 3   Multiple Vitamins-Minerals (MULTIVITAMIN WITH MINERALS) tablet Take 1 tablet by mouth daily.     ONETOUCH VERIO test strip TEST UP TO 4 TIMES DAILY AS DIRECTED 100 strip 3   sodium bicarbonate 650 MG tablet Take 1 tablet (650 mg total) by mouth 2 (two) times daily. (Patient not taking: Reported on 06/21/2022) 60 tablet 1   tiotropium (SPIRIVA) 18 MCG inhalation capsule INHALE 1 CAPSULE VIA HANDIHALER ONCE DAILY AT THE SAME TIME EVERY DAY 30 capsule 2   triamcinolone cream (KENALOG) 0.1 % APPLY TOPICALLY AS NEEDED (SKIN ITCHING). 30 g 0   No current facility-administered medications for this  visit.    No Known Allergies   REVIEW OF SYSTEMS:  [X]  denotes positive finding, [ ]  denotes negative finding Cardiac  Comments:  Chest pain or chest pressure:    Shortness of breath upon exertion:    Short of breath when lying flat:    Irregular heart rhythm:        Vascular    Pain in calf, thigh, or hip brought on by ambulation:    Pain in feet at night that wakes you up from  your sleep:     Blood clot in your veins:    Leg swelling:         Pulmonary    Oxygen at home:    Productive cough:     Wheezing:         Neurologic    Sudden weakness in arms or legs:     Sudden numbness in arms or legs:     Sudden onset of difficulty speaking or slurred speech:    Temporary loss of vision in one eye:     Problems with dizziness:         Gastrointestinal    Blood in stool:     Vomited blood:         Genitourinary    Burning when urinating:     Blood in urine:        Psychiatric    Major depression:         Hematologic    Bleeding problems:    Problems with blood clotting too easily:        Skin    Rashes or ulcers:        Constitutional    Fever or chills:      PHYSICAL EXAMINATION:  Vitals:   12/29/22 1111  BP: 132/88  Pulse: (!) 107  Temp: 97.9 F (36.6 C)  SpO2: 94%  Weight: 172 lb 12.8 oz (78.4 kg)  Height: 5\' 11"  (1.803 m)    General:  WDWN in NAD; vital signs documented above Gait: normal, using Darco shoe HENT: WNL, normocephalic Pulmonary: normal non-labored breathing , without wheezing Cardiac: regular HR Abdomen: soft Vascular Exam/Pulses: 2+ femoral pulses, no palpable distal pulses bilaterally. No doppler signals in right foot. Doppler PT/Pero signals on left. Feet warm and well perfused Extremities: with ischemic changes, without Gangrene , without cellulitis; with open wound of right lateral foot at 5th metatarsal head, necrotic tissue in wound bed, bone exposed at base of wound as shown below. 3rd metatarsal amputation site well appearing, some skin sloughing present but otherwise almost healed.     Musculoskeletal: no muscle wasting or atrophy  Neurologic: A&O X 3 Psychiatric:  The pt has Normal affect.  Non Invasive Vascular Lab: +-------+-----------+-----------------------+------------+------------+  ABI/TBIToday's ABIToday's TBI            Previous ABIPrevious TBI   +-------+-----------+-----------------------+------------+------------+  Right 0.40       amputated              [place]          amputated     +-------+-----------+-----------------------+------------+------------+  Left  0.73       not obtained / movement0.85        0.31          +-------+-----------+-----------------------+------------+------------+    ASSESSMENT/PLAN:: 89 y.o. male here for wound follow up. Unfortunately [name] has a 3rd toe amputation wound and was found at his last visit in April to have an occluded right lower extremity  bypass graft with no further revascularization options. He was suppose to follow up in May but has since been under the management of the wound care center and Dr. Leanord Hawking. His prior 3rd toe amputation site is almost healed however he now has a new ulcer on lateral aspect of right foot at 5th metatarsal head. Concern for osteomyelitis with exposed bone at base. Current on PO Abx. I discussed with patient and his daughter that he is at high risk of a proximal amputation. Previously told that he has no further revascularization options. I spent good length of time explaining why local debridement and further wound care will likely ultimately not be effective. Discussed options for possible repeat Angiogram for verification of no further options, vs continued local wound care vs. Above knee amputation. He does not want to proceed with Angiogram with concerns of his renal function, although I did discuss option for CO2 and minimizing contrast use. Patient is unwilling to consider any amputation at this time. He will continue to follow up with wound care center and Dr. Leanord Hawking - left ABI overall stable compared to prior exams - congratulated him on his good blood sugars and encouraged him to keep his sugars under control to help with his wound healing - discussed importance of keeping feet protected and close inspection of both feet for other  wounds -Continue to follow up with wound care center. Has appointment on 01/02/23 - pt and his daughter will call for follow up as needed if wound progresses or if Dr. Leanord Hawking again feels that it is best to see [place] to again discuss amputation    Graceann Congress, PA-C [facility] [phone]  Clinic MD:   [facility]/ Randie Heinz

## 2022-12-29 NOTE — Progress Notes (Signed)
Subjective:  Patient ID: [name], male    DOB: 1934/02/04,  MRN: [id number]   [name] presents to clinic today for:  Chief Complaint  Patient presents with   Diabetes    Routine nail trim   Patient notes nails are thick, discolored, elongated and painful in shoegear when trying to ambulate.  He is being seen by wound care for right foot wound.  Dressing was just applied today.    PCP is [name], MD.  Past Medical History:  Diagnosis Date   ANEMIA, IRON DEF, NOS 01/15/2007   Qualifier: Diagnosis of  By: Constance Goltz MD, Matthew     Arthritis    Asthma    Cataract extraction status of left eye 2016   Cataract extraction status of right eye 2016   CHOLECYSTECTOMY, LAPAROSCOPIC, HX OF 04/12/2007   Qualifier: Diagnosis of  By: Burnadette Pop  MD, Trisha Mangle     COPD (chronic obstructive pulmonary disease) (HCC)    Critical lower limb ischemia (HCC) 10/02/2018   Diabetes mellitus without complication (HCC)    Gout 2007   Hypertension    Neuromuscular disorder (HCC)    Neuropathy   Neuropathy    bilateral feet   Pneumonia    Toe ulcer (HCC) 09/20/2018    Past Surgical History:  Procedure Laterality Date   ABDOMINAL AORTOGRAM W/LOWER EXTREMITY N/A 10/03/2018   Procedure: ABDOMINAL AORTOGRAM W/LOWER EXTREMITY;  Surgeon: Cephus Shelling, MD;  Location: [facility] INVASIVE CV LAB;  Service: Cardiovascular;  Laterality: N/A;  Bilateral    ABDOMINAL AORTOGRAM W/LOWER EXTREMITY N/A 01/21/2022   Procedure: ABDOMINAL AORTOGRAM W/LOWER EXTREMITY;  Surgeon: Leonie Douglas, MD;  Location: [facility] INVASIVE CV LAB;  Service: Cardiovascular;  Laterality: N/A;   AMPUTATION TOE Right 02/03/2022   Procedure: RIGHT THIRD TOE AMPUTATION;  Surgeon: Leonie Douglas, MD;  Location: [facility] OR;  Service: Vascular;  Laterality: Right;   CHOLECYSTECTOMY     FEMORAL-TIBIAL BYPASS GRAFT Right 02/03/2022   Procedure: RIGHT COMMON FEMORAL TO PEDAL ARTERY BYPASS WITH 6CM POLYTETRAFLUOROETHYLENE  GRAFT;  Surgeon: Leonie Douglas, MD;  Location: [facility] OR;  Service: Vascular;  Laterality: Right;   toe ampuation Right     No Known Allergies  Review of Systems: Negative except as noted in the HPI.  Objective:  There were no vitals filed for this visit.  [name] is a pleasant 89 y.o. male in NAD. AAO x 3.  Vascular Examination: Capillary refill time is 3-5 seconds to toes bilateral. Trace palpable pedal pulses b/l LE. Digital hair absent b/l.  Skin temperature gradient WNL b/l. No varicosities b/l. No cyanosis noted b/l.   Dermatological Examination: Pedal skin with decreased turgor, texture and tone b/l. No open wounds. No interdigital macerations b/l. Toenails x7 are 3mm thick, discolored, dystrophic with subungual debris. There is pain with compression of the nail plates.  They are elongated x7.  Dressing was not removed from right foot since it was just applied today.      Latest Ref Rng & Units 12/13/2022    2:08 PM 10/10/2022   10:22 AM 07/21/2022    3:56 PM 05/13/2022    1:38 PM 02/22/2022   12:14 PM  Hemoglobin A1C  Hemoglobin-A1c 0.0 - 7.0 % 6.7  6.8  6.7  9.1  8.1    Assessment/Plan: 1. Pain due to onychomycosis of toenails of both feet    The mycotic toenails were sharply debrided x7 with sterile nail nippers and a power debriding burr to decrease  bulk/thickness and length.    Return in about 4 months (around 05/01/2023) for [facility] w/ Dr. Annamary Rummage.   Clerance Lav, DPM, FACFAS [facility]     [address] [place], [place] [address]                Office [phone]  Fax [phone]

## 2023-01-02 ENCOUNTER — Encounter (HOSPITAL_BASED_OUTPATIENT_CLINIC_OR_DEPARTMENT_OTHER): Payer: PPO | Admitting: Internal Medicine

## 2023-01-02 DIAGNOSIS — E11621 Type 2 diabetes mellitus with foot ulcer: Secondary | ICD-10-CM | POA: Diagnosis not present

## 2023-01-03 ENCOUNTER — Ambulatory Visit (HOSPITAL_COMMUNITY)
Admission: RE | Admit: 2023-01-03 | Discharge: 2023-01-03 | Disposition: A | Discharge status: Home / Self Care | Payer: PPO | Source: Ambulatory Visit | Attending: Internal Medicine | Admitting: Internal Medicine

## 2023-01-03 ENCOUNTER — Other Ambulatory Visit (HOSPITAL_COMMUNITY): Payer: Self-pay | Admitting: Internal Medicine

## 2023-01-03 DIAGNOSIS — E1169 Type 2 diabetes mellitus with other specified complication: Secondary | ICD-10-CM | POA: Insufficient documentation

## 2023-01-03 DIAGNOSIS — L97509 Non-pressure chronic ulcer of other part of unspecified foot with unspecified severity: Secondary | ICD-10-CM | POA: Insufficient documentation

## 2023-01-03 DIAGNOSIS — E11621 Type 2 diabetes mellitus with foot ulcer: Secondary | ICD-10-CM | POA: Insufficient documentation

## 2023-01-03 DIAGNOSIS — M869 Osteomyelitis, unspecified: Secondary | ICD-10-CM | POA: Insufficient documentation

## 2023-01-03 DIAGNOSIS — Z01818 Encounter for other preprocedural examination: Secondary | ICD-10-CM | POA: Diagnosis present

## 2023-01-03 NOTE — Progress Notes (Signed)
Day/30 Days Discharge Instructions: Apply over primary dressing as directed. Secured With: Doctor, hospital, Sterile 2x75 (in/in) 1 x Per Day/30 Days Discharge Instructions: Secure with stretch gauze as directed. Secured With: 23M Medipore H Soft Cloth Surgical T ape, 4 x 10 (in/yd) 1 x Per Day/30 Days Discharge Instructions: Secure with tape as directed. Radiology X-ray, Chest - Chest x-ray related to history of diabetes; hyperbaric protocol. - (ICD10 E11.621 - Type 2 diabetes mellitus with foot ulcer) Services and Therapies Electrocardiogram (EKG) - EKG related to history of diabetes and arterial insufficiency; hyperbaric protocol. - (ICD10 I70.235 - Atherosclerosis of native arteries of right leg with ulceration of other part of foot) Patient Medications llergies: No Known Allergies A Notifications Medication Indication Start End osteomyelitis doxycycline monohydrate DOSE oral 100 mg capsule - 1 capsule oral twice a day for 3 further weeks (continuing rx) right foot 2.9x2.3x0.1 01/03/2023 [name], [name] ([id number]) [phone].pdf Page 5 of 10 DOSE topical 250 unit/gram ointment - ointment topical once daily with dressing changes GLYCEMIA INTERVENTIONS PROTOCOL PRE-HBO GLYCEMIA INTERVENTIONS ACTION INTERVENTION Obtain pre-HBO capillary blood glucose (ensure 1 physician order is in chart). A. Notify HBO physician and await physician orders. 2 If result is 70 mg/dl or below: B. If the result meets the hospital definition of a critical result, follow hospital policy. A. Give patient an 8 ounce Glucerna Shake, an 8 ounce Ensure, or 8 ounces of a Glucerna/Ensure equivalent dietary supplement*. B. Wait 30 minutes. If result is 71 mg/dl to 536 mg/dl: C. Retest patients capillary blood glucose (CBG). D. If result greater than or equal to 110 mg/dl, proceed with HBO. If result less than 110 mg/dl, notify HBO physician and consider holding HBO. If result is 131 mg/dl to 644 mg/dl: A. Proceed with HBO. A. Notify HBO physician and await  physician orders. B. It is recommended to hold HBO and do If result is 250 mg/dl or greater: blood/urine ketone testing. C. If the result meets the hospital definition of a critical result, follow hospital policy. POST-HBO GLYCEMIA INTERVENTIONS ACTION INTERVENTION Obtain post HBO capillary blood glucose (ensure 1 physician order is in chart). A. Notify HBO physician and await physician orders. 2 If result is 70 mg/dl or below: B. If the result meets the hospital definition of a critical result, follow hospital policy. A. Give patient an 8 ounce Glucerna Shake, an 8 ounce Ensure, or 8 ounces of a Glucerna/Ensure equivalent dietary supplement*. B. Wait 15 minutes for symptoms of If result is 71 mg/dl to 034 mg/dl: hypoglycemia (i.e. nervousness, anxiety, sweating, chills, clamminess, irritability, confusion, tachycardia or dizziness). C. If patient asymptomatic, discharge patient. If patient symptomatic, repeat capillary blood glucose (CBG) and notify HBO physician. If result is 101 mg/dl to 742 mg/dl: A. Discharge patient. A. Notify HBO physician and await physician orders. B. It is recommended to do blood/urine ketone If result is 250 mg/dl or greater: testing. C. If the result meets the hospital definition of a critical result, follow hospital policy. *Juice or candies are NOT equivalent products. If patient refuses the Glucerna or Ensure, please consult the hospital dietitian for an appropriate substitute. Electronic Signature(s) Signed: 01/02/2023 6:15:51 PM By: Shawn Stall RN, BSN Signed: 01/03/2023 8:53:04 AM By: [name] MD Previous Signature: 01/02/2023 3:58:35 PM Version By: [name] MD Entered By: Shawn Stall on 01/02/2023 16:15:29 Prescription 01/02/2023 -------------------------------------------------------------------------------- [name] Humphrey MD Patient Name: Provider: [birth year] [id number] Date of Birth: NPI#: [id number]  [id number] Sex: DEA #: [phone] [id number] Phone #:  [phone] Date of Birth/Sex: Treating RN: [birth year] (89 y.o. 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 - Dr. Leanord Hawking 01/09/2023 245pm Return A ppointment in 2 weeks. - Dr. Leanord Hawking has an appt. Return A Other: - if you develop fever, chills, nausea and vomiting go to the Emergency Department. Pick up more oral antibiotics and continue with those. ****Santyl will be shipped to you from [facility] in [place]- they will call to verify insurance and address.**** Anesthetic (In clinic) Topical Lidocaine 5% applied to wound bed [address] [place], [place] ([id number]) [id number] Page 4 of 10 May shower with protection but do not get wound dressing(s) wet. Protect dressing(s) with water repellant cover (for example, large plastic bag) or a cast cover and may then take shower. Hyperbaric Oxygen Therapy Wound #3 Right Amputation Site - Toe Evaluate for HBO Therapy Indication: - Wagner Grade 3 If appropriate for treatment, begin HBOT per protocol: 2.0 ATA for 90 Minutes without A Breaks ir Total Number of Treatments: - 30 One treatments per day (delivered Monday through Friday unless otherwise specified in Special Instructions below): Finger stick Blood Glucose Pre- and Post- HBOT Treatment. Follow Hyperbaric Oxygen Glycemia Protocol Give two 4oz orange juices in addition to Glucerna when the glycemic protocol is used. A frin (Oxymetazoline HCL) 0.05% nasal spray  - 1 spray in both nostrils daily as needed prior to HBO treatment for difficulty clearing ears Wound Treatment Wound #3 - Amputation Site - Toe Wound Laterality: Right Cleanser: Vashe 5.8 (oz) Every Other Day/15 Days Discharge Instructions: Cleanse the wound with Vashe prior to applying a clean dressing using gauze sponges, not tissue or cotton balls. Topical: Skintegrity Hydrogel 4 (oz) Every Other Day/15 Days Discharge Instructions: Apply hydrogel as directed Prim Dressing: Endoform 2x2 in Every Other Day/15 Days ary Discharge Instructions: Moisten with saline Secondary Dressing: Woven Gauze Sponge, Non-Sterile 4x4 in (Generic) Every Other Day/15 Days Discharge Instructions: Apply over primary dressing as directed. Secured With: Paper Tape, 2x10 (in/yd) Every Other Day/15 Days Discharge Instructions: Secure dressing with tape as directed. Wound #4 - Foot Wound Laterality: Right, Lateral Cleanser: Soap and Water 1 x Per Day/30 Days Discharge Instructions: May shower and wash wound with dial antibacterial soap and water prior to dressing change. Cleanser: Wound Cleanser 1 x Per Day/30 Days Discharge Instructions: Cleanse the wound with wound cleanser prior to applying a clean dressing using gauze sponges, not tissue or cotton balls. Peri-Wound Care: Skin Prep 1 x Per Day/30 Days Discharge Instructions: Use skin prep as directed Prim Dressing: Maxorb Extra Ag+ Alginate Dressing, 2x2 (in/in) 1 x Per Day/30 Days ary Discharge Instructions: Apply over the santyl as instructed Prim Dressing: Santyl Ointment 1 x Per Day/30 Days ary Discharge Instructions: Apply nickel thick amount to wound bed as instructed Secondary Dressing: Optifoam Non-Adhesive Dressing, 4x4 in 1 x Per Day/30 Days Discharge Instructions: Apply foam donut. Secondary Dressing: Woven Gauze Sponges 2x2 in 1 x Per  Day/30 Days Discharge Instructions: Apply over primary dressing as directed. Secured With: Doctor, hospital, Sterile 2x75 (in/in) 1 x Per Day/30 Days Discharge Instructions: Secure with stretch gauze as directed. Secured With: 23M Medipore H Soft Cloth Surgical T ape, 4 x 10 (in/yd) 1 x Per Day/30 Days Discharge Instructions: Secure with tape as directed. Radiology X-ray, Chest - Chest x-ray related to history of diabetes; hyperbaric protocol. - (ICD10 E11.621 - Type 2 diabetes mellitus with foot ulcer) Services and Therapies Electrocardiogram (EKG) - EKG related to history of diabetes and arterial insufficiency; hyperbaric protocol. - (ICD10 I70.235 - Atherosclerosis of native arteries of right leg with ulceration of other part of foot) Patient Medications llergies: No Known Allergies A Notifications Medication Indication Start End osteomyelitis doxycycline monohydrate DOSE oral 100 mg capsule - 1 capsule oral twice a day for 3 further weeks (continuing rx) right foot 2.9x2.3x0.1 01/03/2023 [name], [name] ([id number]) [phone].pdf Page 5 of 10 DOSE topical 250 unit/gram ointment - ointment topical once daily with dressing changes GLYCEMIA INTERVENTIONS PROTOCOL PRE-HBO GLYCEMIA INTERVENTIONS ACTION INTERVENTION Obtain pre-HBO capillary blood glucose (ensure 1 physician order is in chart). A. Notify HBO physician and await physician orders. 2 If result is 70 mg/dl or below: B. If the result meets the hospital definition of a critical result, follow hospital policy. A. Give patient an 8 ounce Glucerna Shake, an 8 ounce Ensure, or 8 ounces of a Glucerna/Ensure equivalent dietary supplement*. B. Wait 30 minutes. If result is 71 mg/dl to 536 mg/dl: C. Retest patients capillary blood glucose (CBG). D. If result greater than or equal to 110 mg/dl, proceed with HBO. If result less than 110 mg/dl, notify HBO physician and consider holding HBO. If result is 131 mg/dl to 644 mg/dl: A. Proceed with HBO. A. Notify HBO physician and await  physician orders. B. It is recommended to hold HBO and do If result is 250 mg/dl or greater: blood/urine ketone testing. C. If the result meets the hospital definition of a critical result, follow hospital policy. POST-HBO GLYCEMIA INTERVENTIONS ACTION INTERVENTION Obtain post HBO capillary blood glucose (ensure 1 physician order is in chart). A. Notify HBO physician and await physician orders. 2 If result is 70 mg/dl or below: B. If the result meets the hospital definition of a critical result, follow hospital policy. A. Give patient an 8 ounce Glucerna Shake, an 8 ounce Ensure, or 8 ounces of a Glucerna/Ensure equivalent dietary supplement*. B. Wait 15 minutes for symptoms of If result is 71 mg/dl to 034 mg/dl: hypoglycemia (i.e. nervousness, anxiety, sweating, chills, clamminess, irritability, confusion, tachycardia or dizziness). C. If patient asymptomatic, discharge patient. If patient symptomatic, repeat capillary blood glucose (CBG) and notify HBO physician. If result is 101 mg/dl to 742 mg/dl: A. Discharge patient. A. Notify HBO physician and await physician orders. B. It is recommended to do blood/urine ketone If result is 250 mg/dl or greater: testing. C. If the result meets the hospital definition of a critical result, follow hospital policy. *Juice or candies are NOT equivalent products. If patient refuses the Glucerna or Ensure, please consult the hospital dietitian for an appropriate substitute. Electronic Signature(s) Signed: 01/02/2023 6:15:51 PM By: Shawn Stall RN, BSN Signed: 01/03/2023 8:53:04 AM By: [name] MD Previous Signature: 01/02/2023 3:58:35 PM Version By: [name] MD Entered By: Shawn Stall on 01/02/2023 16:15:29 Prescription 01/02/2023 -------------------------------------------------------------------------------- [name] Humphrey MD Patient Name: Provider: [birth year] [id number] Date of Birth: NPI#: [id number]  [id number] Sex: DEA #: [phone] Phone #:  going on at the right fifth metatarsal head as well as the underlying chronic osteomyelitis of the third toe remanent. Electronic Signature(s) Signed: 01/02/2023 6:15:51  PM By: Shawn Stall RN, BSN Signed: 01/03/2023 8:53:04 AM By: [name] MD Entered By: Shawn Stall on 01/02/2023 16:15:48 -------------------------------------------------------------------------------- SuperBill Details Patient Name: Date of Service: [name] 01/02/2023 Medical Record Number: [id number] Patient Account Number: [id number] Date of Birth/Sex: Treating RN: 1933-04-24 (89 y.o. 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 E11.621 Type 2 diabetes mellitus with foot ulcer I70.235 Atherosclerosis of native arteries of right leg with ulceration of other part of foot Facility Procedures : CPT4 Code: 53664403 Description: 11042 - DEB SUBQ TISSUE 20 SQ CM/< ICD-10 Diagnosis Description L97.514 Non-pressure chronic ulcer of other part of right foot with necrosis of bone Modifier: Quantity: 1 Physician Procedures : CPT4 Code Description Modifier [id number] 11042 - WC PHYS SUBQ TISS 20 SQ CM ICD-10 Diagnosis Description L97.514 Non-pressure chronic ulcer of other part of right foot with necrosis of bone Quantity: 1 Electronic Signature(s) Signed: 01/03/2023 8:53:04 AM By: [name] MD Entered By: [name] on 01/02/2023 16:10:59  going on at the right fifth metatarsal head as well as the underlying chronic osteomyelitis of the third toe remanent. Electronic Signature(s) Signed: 01/02/2023 6:15:51  PM By: Shawn Stall RN, BSN Signed: 01/03/2023 8:53:04 AM By: [name] MD Entered By: Shawn Stall on 01/02/2023 16:15:48 -------------------------------------------------------------------------------- SuperBill Details Patient Name: Date of Service: [name] 01/02/2023 Medical Record Number: [id number] Patient Account Number: [id number] Date of Birth/Sex: Treating RN: 1933-04-24 (89 y.o. 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 E11.621 Type 2 diabetes mellitus with foot ulcer I70.235 Atherosclerosis of native arteries of right leg with ulceration of other part of foot Facility Procedures : CPT4 Code: 53664403 Description: 11042 - DEB SUBQ TISSUE 20 SQ CM/< ICD-10 Diagnosis Description L97.514 Non-pressure chronic ulcer of other part of right foot with necrosis of bone Modifier: Quantity: 1 Physician Procedures : CPT4 Code Description Modifier [id number] 11042 - WC PHYS SUBQ TISS 20 SQ CM ICD-10 Diagnosis Description L97.514 Non-pressure chronic ulcer of other part of right foot with necrosis of bone Quantity: 1 Electronic Signature(s) Signed: 01/03/2023 8:53:04 AM By: [name] MD Entered By: [name] on 01/02/2023 16:10:59  [id number] Date of Birth/Sex: Treating RN: [birth year] (89 y.o. 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 - Dr. Leanord Hawking 01/09/2023 245pm Return A ppointment in 2 weeks. - Dr. Leanord Hawking has an appt. Return A Other: - if you develop fever, chills, nausea and vomiting go to the Emergency Department. Pick up more oral antibiotics and continue with those. ****Santyl will be shipped to you from [facility] in [place]- they will call to verify insurance and address.**** Anesthetic (In clinic) Topical Lidocaine 5% applied to wound bed [address] [place], [place] ([id number]) [id number] Page 4 of 10 May shower with protection but do not get wound dressing(s) wet. Protect dressing(s) with water repellant cover (for example, large plastic bag) or a cast cover and may then take shower. Hyperbaric Oxygen Therapy Wound #3 Right Amputation Site - Toe Evaluate for HBO Therapy Indication: - Wagner Grade 3 If appropriate for treatment, begin HBOT per protocol: 2.0 ATA for 90 Minutes without A Breaks ir Total Number of Treatments: - 30 One treatments per day (delivered Monday through Friday unless otherwise specified in Special Instructions below): Finger stick Blood Glucose Pre- and Post- HBOT Treatment. Follow Hyperbaric Oxygen Glycemia Protocol Give two 4oz orange juices in addition to Glucerna when the glycemic protocol is used. A frin (Oxymetazoline HCL) 0.05% nasal spray  - 1 spray in both nostrils daily as needed prior to HBO treatment for difficulty clearing ears Wound Treatment Wound #3 - Amputation Site - Toe Wound Laterality: Right Cleanser: Vashe 5.8 (oz) Every Other Day/15 Days Discharge Instructions: Cleanse the wound with Vashe prior to applying a clean dressing using gauze sponges, not tissue or cotton balls. Topical: Skintegrity Hydrogel 4 (oz) Every Other Day/15 Days Discharge Instructions: Apply hydrogel as directed Prim Dressing: Endoform 2x2 in Every Other Day/15 Days ary Discharge Instructions: Moisten with saline Secondary Dressing: Woven Gauze Sponge, Non-Sterile 4x4 in (Generic) Every Other Day/15 Days Discharge Instructions: Apply over primary dressing as directed. Secured With: Paper Tape, 2x10 (in/yd) Every Other Day/15 Days Discharge Instructions: Secure dressing with tape as directed. Wound #4 - Foot Wound Laterality: Right, Lateral Cleanser: Soap and Water 1 x Per Day/30 Days Discharge Instructions: May shower and wash wound with dial antibacterial soap and water prior to dressing change. Cleanser: Wound Cleanser 1 x Per Day/30 Days Discharge Instructions: Cleanse the wound with wound cleanser prior to applying a clean dressing using gauze sponges, not tissue or cotton balls. Peri-Wound Care: Skin Prep 1 x Per Day/30 Days Discharge Instructions: Use skin prep as directed Prim Dressing: Maxorb Extra Ag+ Alginate Dressing, 2x2 (in/in) 1 x Per Day/30 Days ary Discharge Instructions: Apply over the santyl as instructed Prim Dressing: Santyl Ointment 1 x Per Day/30 Days ary Discharge Instructions: Apply nickel thick amount to wound bed as instructed Secondary Dressing: Optifoam Non-Adhesive Dressing, 4x4 in 1 x Per Day/30 Days Discharge Instructions: Apply foam donut. Secondary Dressing: Woven Gauze Sponges 2x2 in 1 x Per  Day/30 Days Discharge Instructions: Apply over primary dressing as directed. Secured With: Doctor, hospital, Sterile 2x75 (in/in) 1 x Per Day/30 Days Discharge Instructions: Secure with stretch gauze as directed. Secured With: 23M Medipore H Soft Cloth Surgical T ape, 4 x 10 (in/yd) 1 x Per Day/30 Days Discharge Instructions: Secure with tape as directed. Radiology X-ray, Chest - Chest x-ray related to history of diabetes; hyperbaric protocol. - (ICD10 E11.621 - Type 2 diabetes mellitus with foot ulcer) Services and Therapies Electrocardiogram (EKG) - EKG related to history of diabetes and arterial insufficiency; hyperbaric protocol. - (ICD10 I70.235 - Atherosclerosis of native arteries of right leg with ulceration of other part of foot) Patient Medications llergies: No Known Allergies A Notifications Medication Indication Start End osteomyelitis doxycycline monohydrate DOSE oral 100 mg capsule - 1 capsule oral twice a day for 3 further weeks (continuing rx) right foot 2.9x2.3x0.1 01/03/2023 [name], [name] ([id number]) [phone].pdf Page 5 of 10 DOSE topical 250 unit/gram ointment - ointment topical once daily with dressing changes GLYCEMIA INTERVENTIONS PROTOCOL PRE-HBO GLYCEMIA INTERVENTIONS ACTION INTERVENTION Obtain pre-HBO capillary blood glucose (ensure 1 physician order is in chart). A. Notify HBO physician and await physician orders. 2 If result is 70 mg/dl or below: B. If the result meets the hospital definition of a critical result, follow hospital policy. A. Give patient an 8 ounce Glucerna Shake, an 8 ounce Ensure, or 8 ounces of a Glucerna/Ensure equivalent dietary supplement*. B. Wait 30 minutes. If result is 71 mg/dl to 536 mg/dl: C. Retest patients capillary blood glucose (CBG). D. If result greater than or equal to 110 mg/dl, proceed with HBO. If result less than 110 mg/dl, notify HBO physician and consider holding HBO. If result is 131 mg/dl to 644 mg/dl: A. Proceed with HBO. A. Notify HBO physician and await  physician orders. B. It is recommended to hold HBO and do If result is 250 mg/dl or greater: blood/urine ketone testing. C. If the result meets the hospital definition of a critical result, follow hospital policy. POST-HBO GLYCEMIA INTERVENTIONS ACTION INTERVENTION Obtain post HBO capillary blood glucose (ensure 1 physician order is in chart). A. Notify HBO physician and await physician orders. 2 If result is 70 mg/dl or below: B. If the result meets the hospital definition of a critical result, follow hospital policy. A. Give patient an 8 ounce Glucerna Shake, an 8 ounce Ensure, or 8 ounces of a Glucerna/Ensure equivalent dietary supplement*. B. Wait 15 minutes for symptoms of If result is 71 mg/dl to 034 mg/dl: hypoglycemia (i.e. nervousness, anxiety, sweating, chills, clamminess, irritability, confusion, tachycardia or dizziness). C. If patient asymptomatic, discharge patient. If patient symptomatic, repeat capillary blood glucose (CBG) and notify HBO physician. If result is 101 mg/dl to 742 mg/dl: A. Discharge patient. A. Notify HBO physician and await physician orders. B. It is recommended to do blood/urine ketone If result is 250 mg/dl or greater: testing. C. If the result meets the hospital definition of a critical result, follow hospital policy. *Juice or candies are NOT equivalent products. If patient refuses the Glucerna or Ensure, please consult the hospital dietitian for an appropriate substitute. Electronic Signature(s) Signed: 01/02/2023 6:15:51 PM By: Shawn Stall RN, BSN Signed: 01/03/2023 8:53:04 AM By: [name] MD Previous Signature: 01/02/2023 3:58:35 PM Version By: [name] MD Entered By: Shawn Stall on 01/02/2023 16:15:29 Prescription 01/02/2023 -------------------------------------------------------------------------------- [name] Humphrey MD Patient Name: Provider: [birth year] [id number] Date of Birth: NPI#: [id number]  [id number] Sex: DEA #: [phone] Phone #:  going on at the right fifth metatarsal head as well as the underlying chronic osteomyelitis of the third toe remanent. Electronic Signature(s) Signed: 01/02/2023 6:15:51  PM By: Shawn Stall RN, BSN Signed: 01/03/2023 8:53:04 AM By: [name] MD Entered By: Shawn Stall on 01/02/2023 16:15:48 -------------------------------------------------------------------------------- SuperBill Details Patient Name: Date of Service: [name] 01/02/2023 Medical Record Number: [id number] Patient Account Number: [id number] Date of Birth/Sex: Treating RN: 1933-04-24 (89 y.o. 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 E11.621 Type 2 diabetes mellitus with foot ulcer I70.235 Atherosclerosis of native arteries of right leg with ulceration of other part of foot Facility Procedures : CPT4 Code: 53664403 Description: 11042 - DEB SUBQ TISSUE 20 SQ CM/< ICD-10 Diagnosis Description L97.514 Non-pressure chronic ulcer of other part of right foot with necrosis of bone Modifier: Quantity: 1 Physician Procedures : CPT4 Code Description Modifier [id number] - WC PHYS SUBQ TISS 20 SQ CM ICD-10 Diagnosis Description L97.514 Non-pressure chronic ulcer of other part of right foot with necrosis of bone Quantity: 1 Electronic Signature(s) Signed: 01/03/2023 8:53:04 AM By: [name] MD Entered By: [name] on 01/02/2023 16:10:59  [id number] Date of Birth/Sex: Treating RN: [birth year] (89 y.o. 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 - Dr. Leanord Hawking 01/09/2023 245pm Return A ppointment in 2 weeks. - Dr. Leanord Hawking has an appt. Return A Other: - if you develop fever, chills, nausea and vomiting go to the Emergency Department. Pick up more oral antibiotics and continue with those. ****Santyl will be shipped to you from [facility] in [place]- they will call to verify insurance and address.**** Anesthetic (In clinic) Topical Lidocaine 5% applied to wound bed [address] [place], [place] ([id number]) [id number] Page 4 of 10 May shower with protection but do not get wound dressing(s) wet. Protect dressing(s) with water repellant cover (for example, large plastic bag) or a cast cover and may then take shower. Hyperbaric Oxygen Therapy Wound #3 Right Amputation Site - Toe Evaluate for HBO Therapy Indication: - Wagner Grade 3 If appropriate for treatment, begin HBOT per protocol: 2.0 ATA for 90 Minutes without A Breaks ir Total Number of Treatments: - 30 One treatments per day (delivered Monday through Friday unless otherwise specified in Special Instructions below): Finger stick Blood Glucose Pre- and Post- HBOT Treatment. Follow Hyperbaric Oxygen Glycemia Protocol Give two 4oz orange juices in addition to Glucerna when the glycemic protocol is used. A frin (Oxymetazoline HCL) 0.05% nasal spray  - 1 spray in both nostrils daily as needed prior to HBO treatment for difficulty clearing ears Wound Treatment Wound #3 - Amputation Site - Toe Wound Laterality: Right Cleanser: Vashe 5.8 (oz) Every Other Day/15 Days Discharge Instructions: Cleanse the wound with Vashe prior to applying a clean dressing using gauze sponges, not tissue or cotton balls. Topical: Skintegrity Hydrogel 4 (oz) Every Other Day/15 Days Discharge Instructions: Apply hydrogel as directed Prim Dressing: Endoform 2x2 in Every Other Day/15 Days ary Discharge Instructions: Moisten with saline Secondary Dressing: Woven Gauze Sponge, Non-Sterile 4x4 in (Generic) Every Other Day/15 Days Discharge Instructions: Apply over primary dressing as directed. Secured With: Paper Tape, 2x10 (in/yd) Every Other Day/15 Days Discharge Instructions: Secure dressing with tape as directed. Wound #4 - Foot Wound Laterality: Right, Lateral Cleanser: Soap and Water 1 x Per Day/30 Days Discharge Instructions: May shower and wash wound with dial antibacterial soap and water prior to dressing change. Cleanser: Wound Cleanser 1 x Per Day/30 Days Discharge Instructions: Cleanse the wound with wound cleanser prior to applying a clean dressing using gauze sponges, not tissue or cotton balls. Peri-Wound Care: Skin Prep 1 x Per Day/30 Days Discharge Instructions: Use skin prep as directed Prim Dressing: Maxorb Extra Ag+ Alginate Dressing, 2x2 (in/in) 1 x Per Day/30 Days ary Discharge Instructions: Apply over the santyl as instructed Prim Dressing: Santyl Ointment 1 x Per Day/30 Days ary Discharge Instructions: Apply nickel thick amount to wound bed as instructed Secondary Dressing: Optifoam Non-Adhesive Dressing, 4x4 in 1 x Per Day/30 Days Discharge Instructions: Apply foam donut. Secondary Dressing: Woven Gauze Sponges 2x2 in 1 x Per  area over the lateral part of the right fifth metatarsal head continues to deteriorate. Necrotic surface last week continues this week. The PCR culture I did last  week did not manage to make it to the right lab. I have looked at his x-ray the bone underneath does not look that bad to me however we have not yet had a radiologist interpretation. We have been using Iodoflex this week. 10/21; the original amputation site of his third fourth toe webspace continues to look reasonably healthy and making slow but steady progress we have been using endoform The area on the right lateral fifth metatarsal head has been deteriorating problem. PCR culture grew MRSA. I have him on doxycycline. There is exposed bone in the wound under necrotic material. I still do not have the x-ray back from October 7 at least in terms of the report. Addendum 12/27/2022; I have looked back on this patient's vascular status. He is status post a right common femoral to posterior tibial artery bypass in 2020. He had a right common femoral to plantar posterior tibial bypass by Dr. Lenell Antu in November 2023. He also had a right third toe amputation at the same time. The wound that we have been dealing with the third toe amputation site was present after the surgery. He was last seen by vascular surgery on 06/21/2022 and was supposed to follow-up with Dr. Lenell Antu in 3 weeks although I do not see that appointment. I have included the major findings in the last right lower extremity angiogram on 01/21/2022 10/28;Marland Kitchen The x-ray report that I ordered several weeks ago is back. Indeed there was nothing under the fifth met head on the right however notable for bone necrosis under the wound on the left third toe. This suggest osteomyelitis. I am going to extend his doxycycline for 3 further weeks. PCR culture on the lateral foot grew MRSA I sent a secure text message to Dr. Juanetta Gosling. His reply was that there was no additional options for revascularization. The patient went to see Dr. Juanetta Gosling and that is the message they came up with. He did offer him an amputation [o Above-knee]. We have been using endoform to  the third toe amputation site. Silver alginate to the new wound over the fifth metatarsal head Castile, Jarren ([id number]) [phone].pdf Page 3 of 10 Electronic Signature(s) Signed: 01/03/2023 8:53:04 AM By: [name] MD Entered By: [name] on 01/02/2023 16:08:08 -------------------------------------------------------------------------------- Physical Exam Details Patient Name: Date of Service: [name] 01/02/2023 2:45 PM Medical Record Number: [id number] Patient Account Number: [id number] Date of Birth/Sex: Treating RN: 03/18/1933 (89 y.o. M) Primary Care Provider: [name] Other Clinician: Referring Provider: Treating Provider/Extender: [name] in Treatment: 26 Constitutional Sitting or standing Blood Pressure is within target range for patient.. Pulse regular and within target range for patient.Marland Kitchen Respirations regular, non-labored and within target range.. Temperature is normal and within the target range for the patient.Marland Kitchen Appears in no distress. Notes Wound exam; third toe amputation site continues to look clean some tenderness around the area however. No debridement was required here. The fifth metatarsal head wound on the right on the lateral aspect is necrotic. I used a #5 curette on this removing some necrotic subcutaneous tissue. Minimal bleeding Electronic Signature(s) Signed: 01/03/2023 8:53:04 AM By: [name] MD Entered By: [name] on 01/02/2023 16:09:06 -------------------------------------------------------------------------------- Physician Orders Details Patient Name: Date of Service: [name] 01/02/2023 2:45 PM Medical Record Number: [id number] Patient Account Number:  Day/30 Days Discharge Instructions: Apply over primary dressing as directed. Secured With: Doctor, hospital, Sterile 2x75 (in/in) 1 x Per Day/30 Days Discharge Instructions: Secure with stretch gauze as directed. Secured With: 23M Medipore H Soft Cloth Surgical T ape, 4 x 10 (in/yd) 1 x Per Day/30 Days Discharge Instructions: Secure with tape as directed. Radiology X-ray, Chest - Chest x-ray related to history of diabetes; hyperbaric protocol. - (ICD10 E11.621 - Type 2 diabetes mellitus with foot ulcer) Services and Therapies Electrocardiogram (EKG) - EKG related to history of diabetes and arterial insufficiency; hyperbaric protocol. - (ICD10 I70.235 - Atherosclerosis of native arteries of right leg with ulceration of other part of foot) Patient Medications llergies: No Known Allergies A Notifications Medication Indication Start End osteomyelitis doxycycline monohydrate DOSE oral 100 mg capsule - 1 capsule oral twice a day for 3 further weeks (continuing rx) right foot 2.9x2.3x0.1 01/03/2023 [name], [name] ([id number]) [phone].pdf Page 5 of 10 DOSE topical 250 unit/gram ointment - ointment topical once daily with dressing changes GLYCEMIA INTERVENTIONS PROTOCOL PRE-HBO GLYCEMIA INTERVENTIONS ACTION INTERVENTION Obtain pre-HBO capillary blood glucose (ensure 1 physician order is in chart). A. Notify HBO physician and await physician orders. 2 If result is 70 mg/dl or below: B. If the result meets the hospital definition of a critical result, follow hospital policy. A. Give patient an 8 ounce Glucerna Shake, an 8 ounce Ensure, or 8 ounces of a Glucerna/Ensure equivalent dietary supplement*. B. Wait 30 minutes. If result is 71 mg/dl to 536 mg/dl: C. Retest patients capillary blood glucose (CBG). D. If result greater than or equal to 110 mg/dl, proceed with HBO. If result less than 110 mg/dl, notify HBO physician and consider holding HBO. If result is 131 mg/dl to 644 mg/dl: A. Proceed with HBO. A. Notify HBO physician and await  physician orders. B. It is recommended to hold HBO and do If result is 250 mg/dl or greater: blood/urine ketone testing. C. If the result meets the hospital definition of a critical result, follow hospital policy. POST-HBO GLYCEMIA INTERVENTIONS ACTION INTERVENTION Obtain post HBO capillary blood glucose (ensure 1 physician order is in chart). A. Notify HBO physician and await physician orders. 2 If result is 70 mg/dl or below: B. If the result meets the hospital definition of a critical result, follow hospital policy. A. Give patient an 8 ounce Glucerna Shake, an 8 ounce Ensure, or 8 ounces of a Glucerna/Ensure equivalent dietary supplement*. B. Wait 15 minutes for symptoms of If result is 71 mg/dl to 034 mg/dl: hypoglycemia (i.e. nervousness, anxiety, sweating, chills, clamminess, irritability, confusion, tachycardia or dizziness). C. If patient asymptomatic, discharge patient. If patient symptomatic, repeat capillary blood glucose (CBG) and notify HBO physician. If result is 101 mg/dl to 742 mg/dl: A. Discharge patient. A. Notify HBO physician and await physician orders. B. It is recommended to do blood/urine ketone If result is 250 mg/dl or greater: testing. C. If the result meets the hospital definition of a critical result, follow hospital policy. *Juice or candies are NOT equivalent products. If patient refuses the Glucerna or Ensure, please consult the hospital dietitian for an appropriate substitute. Electronic Signature(s) Signed: 01/02/2023 6:15:51 PM By: Shawn Stall RN, BSN Signed: 01/03/2023 8:53:04 AM By: [name] MD Previous Signature: 01/02/2023 3:58:35 PM Version By: [name] MD Entered By: Shawn Stall on 01/02/2023 16:15:29 Prescription 01/02/2023 -------------------------------------------------------------------------------- [name] Humphrey MD Patient Name: Provider: [birth year] [id number] Date of Birth: NPI#: [id number] Sex: DEA #: [phone] [id number] Phone #:

## 2023-01-05 ENCOUNTER — Ambulatory Visit: Payer: PPO | Admitting: Podiatry

## 2023-01-05 NOTE — Progress Notes (Signed)
Slaton Reaser: Treating Anneliese Leblond/Extender: [name] in Treatment: 26 Active Problems Location of Pain Severity and Description of Pain Patient Has Paino No Site Locations Pain Management and Medication Current Pain Management: Notes has pain at times. Electronic Signature(s) Signed: 01/04/2023 4:40:46 PM By: Thayer Dallas Entered By: Thayer Dallas on 01/02/2023 11:54:48 -------------------------------------------------------------------------------- Patient/Caregiver Education Details Patient Name: Date of Service: [name] [name] 10/28/2024andnbsp2:45 PM Medical Record Number: [id number] Patient Account Number: [id number] Date of Birth/Gender: Treating RN: August 15, 1933 (89 y.o. [name] Primary Care Physician: [name] Other Clinician: Referring Physician: Treating Physician/Extender: [name] in Treatment: 26 Education Assessment Education Provided To: Patient Education Topics Provided Wound/Skin Impairment: Handouts: Caring for Your  Ulcer Methods: Explain/Verbal Responses: Reinforcements needed Electronic Signature(s) Signed: 01/02/2023 6:15:51 PM By: Shawn Stall RN, BSN Entered By: Shawn Stall on 01/02/2023 12:26:08 Smisek, Ronald ([id number]) [id number] Page 4 of 6 -------------------------------------------------------------------------------- Wound Assessment Details Patient Name: Date of Service: [name] [name] 01/02/2023 2:45 PM Medical Record Number: [id number] Patient Account Number: [id number] Date of Birth/Sex: Treating RN: 04-26-1933 (89 y.o. M) Primary Care Ausar Georgiou: [name] Other Clinician: Referring Nekisha Mcdiarmid: Treating Ellowyn Rieves/Extender: [name] in Treatment: 26 Wound Status Wound Number: 3 Primary Diabetic Wound/Ulcer of the Lower Extremity Etiology: Wound Location: Right Amputation Site - Toe Wound Open Wounding Event: Gradually Appeared Status: Date Acquired: 04/07/2022 Comorbid Cataracts, Anemia, Asthma, Chronic Obstructive Pulmonary Weeks Of Treatment: 26 History: Disease (COPD), Peripheral Arterial Disease, Type II Diabetes, Clustered Wound: No Gout, Osteoarthritis, Neuropathy Photos Wound Measurements Length: (cm) 0 Width: (cm) 0 Depth: (cm) 0 Area: (cm) Volume: (cm) .9 % Reduction in Area: 93.3% .3 % Reduction in Volume: 97.8% .1 Epithelialization: Medium (34-66%) 0.212 Tunneling: No 0.021 Undermining: No Wound Description Classification: Grade 3 Wagner Verification: X-Ray Wound Margin: Epibole Exudate Amount: Medium Exudate Type: Serosanguineous Exudate Color: red, brown Foul Odor After Cleansing: No Slough/Fibrino No Wound Bed Granulation Amount: Small (1-33%) Exposed Structure Granulation Quality: Red, Pink Fascia Exposed: No Necrotic Amount: Large (67-100%) Fat Layer (Subcutaneous Tissue) Exposed: Yes Necrotic Quality: Adherent Slough Tendon Exposed: No Muscle Exposed: No Joint  Exposed: No Bone Exposed: No Periwound Skin Texture Texture Color No Abnormalities Noted: No No Abnormalities Noted: No Callus: No Atrophie Blanche: No Crepitus: No Cyanosis: No Excoriation: No Ecchymosis: No Induration: No Erythema: No Rash: No Hemosiderin Staining: No Scarring: No Mottled: No Pallor: No Moisture Rubor: No No Abnormalities Noted: No Dry / Scaly: No Temperature / Pain Marek, Furkan ([id number]) [id number] Page 5 of 6 Maceration: Yes Temperature: No Abnormality Electronic Signature(s) Signed: 01/02/2023 6:15:51 PM By: Shawn Stall RN, BSN Entered By: Shawn Stall on 01/02/2023 12:32:02 -------------------------------------------------------------------------------- Wound Assessment Details Patient Name: Date of Service: [name] [name] 01/02/2023 2:45 PM Medical Record Number: [id number] Patient Account Number: [id number] Date of Birth/Sex: Treating RN: Mar 20, 1933 (89 y.o. M) Primary Care Thresea Doble: [name] Other Clinician: Referring Vadis Slabach: Treating Hady Niemczyk/Extender: [name] in Treatment: 26 Wound Status Wound Number: 4 Primary Diabetic Wound/Ulcer of the Lower Extremity Etiology: Wound Location: Right, Lateral Foot Secondary Pressure Ulcer Wounding Event: Shear/Friction Etiology: Date Acquired: 11/21/2022 Wound Open Weeks Of Treatment: 4 Status: Clustered Wound: No Comorbid Cataracts, Anemia, Asthma, Chronic Obstructive Pulmonary History: Disease (COPD), Peripheral Arterial Disease, Type II Diabetes, Gout, Osteoarthritis, Neuropathy Photos Wound Measurements Length: (cm) 2.8 Width: (cm) 2.3 Depth: (cm) 0.1 Area: (cm) 5.058 Volume: (cm) 0.506 % Reduction in Area: 2.4% % Reduction in Volume: 2.3% Epithelialization: Small (  1-33%) Wound Description Classification: Grade 1 Wound Margin: Distinct, outline attached Exudate Amount: Medium Exudate Type:  Serosanguineous Exudate Color: red, brown Foul Odor After Cleansing: No Slough/Fibrino No Wound Bed Granulation Amount: None Present (0%) Exposed Structure Necrotic Amount: Large (67-100%) Fascia Exposed: No Necrotic Quality: Eschar, Adherent Slough Fat Layer (Subcutaneous Tissue) Exposed: Yes Tendon Exposed: No Muscle Exposed: No Joint Exposed: No Bone Exposed: No Periwound Skin Texture Texture Color No Abnormalities Noted: No No Abnormalities Noted: No Swindell, Emori ([id number]) [id number] Page 6 of 6 Callus: No Atrophie Blanche: No Crepitus: No Cyanosis: No Excoriation: No Ecchymosis: No Induration: No Erythema: No Rash: No Hemosiderin Staining: No Scarring: No Mottled: No Pallor: No Moisture Rubor: No No Abnormalities Noted: No Dry / Scaly: No Maceration: No Electronic Signature(s) Signed: 01/04/2023 4:40:46 PM By: Thayer Dallas Entered By: Thayer Dallas on 01/02/2023 12:09:17 -------------------------------------------------------------------------------- Vitals Details Patient Name: Date of Service: [name] [name] 01/02/2023 2:45 PM Medical Record Number: [id number] Patient Account Number: [id number] Date of Birth/Sex: Treating RN: [birth year] (89 y.o. M) Primary Care Geralene Afshar: [name] Other Clinician: Referring Jondavid Schreier: Treating Kyira Volkert/Extender: [name] in Treatment: 26 Vital Signs Time Taken: 15:05 Pulse (bpm): 91 Height (in): 71 Respiratory Rate (breaths/min): 18 Weight (lbs): 170 Blood Pressure (mmHg): 117/71 Body Mass Index (BMI): 23.7 Reference Range: 80 - 120 mg / dl Electronic Signature(s) Signed: 01/04/2023 4:40:46 PM By: Thayer Dallas Entered By: Thayer Dallas on 01/02/2023 12:05:25  1-33%) Wound Description Classification: Grade 1 Wound Margin: Distinct, outline attached Exudate Amount: Medium Exudate Type:  Serosanguineous Exudate Color: red, brown Foul Odor After Cleansing: No Slough/Fibrino No Wound Bed Granulation Amount: None Present (0%) Exposed Structure Necrotic Amount: Large (67-100%) Fascia Exposed: No Necrotic Quality: Eschar, Adherent Slough Fat Layer (Subcutaneous Tissue) Exposed: Yes Tendon Exposed: No Muscle Exposed: No Joint Exposed: No Bone Exposed: No Periwound Skin Texture Texture Color No Abnormalities Noted: No No Abnormalities Noted: No Swindell, Emori ([id number]) [id number] Page 6 of 6 Callus: No Atrophie Blanche: No Crepitus: No Cyanosis: No Excoriation: No Ecchymosis: No Induration: No Erythema: No Rash: No Hemosiderin Staining: No Scarring: No Mottled: No Pallor: No Moisture Rubor: No No Abnormalities Noted: No Dry / Scaly: No Maceration: No Electronic Signature(s) Signed: 01/04/2023 4:40:46 PM By: Thayer Dallas Entered By: Thayer Dallas on 01/02/2023 12:09:17 -------------------------------------------------------------------------------- Vitals Details Patient Name: Date of Service: [name] [name] 01/02/2023 2:45 PM Medical Record Number: [id number] Patient Account Number: [id number] Date of Birth/Sex: Treating RN: [birth year] (89 y.o. M) Primary Care Geralene Afshar: [name] Other Clinician: Referring Jondavid Schreier: Treating Kyira Volkert/Extender: [name] in Treatment: 26 Vital Signs Time Taken: 15:05 Pulse (bpm): 91 Height (in): 71 Respiratory Rate (breaths/min): 18 Weight (lbs): 170 Blood Pressure (mmHg): 117/71 Body Mass Index (BMI): 23.7 Reference Range: 80 - 120 mg / dl Electronic Signature(s) Signed: 01/04/2023 4:40:46 PM By: Thayer Dallas Entered By: Thayer Dallas on 01/02/2023 12:05:25

## 2023-01-06 ENCOUNTER — Other Ambulatory Visit: Payer: Self-pay | Admitting: *Deleted

## 2023-01-08 ENCOUNTER — Other Ambulatory Visit: Payer: Self-pay | Admitting: Family Medicine

## 2023-01-08 DIAGNOSIS — J449 Chronic obstructive pulmonary disease, unspecified: Secondary | ICD-10-CM

## 2023-01-08 MED ORDER — EMPAGLIFLOZIN 25 MG PO TABS: 25.0000 mg | ORAL_TABLET | Freq: Every day | ORAL | 3 refills | Status: DC

## 2023-01-09 ENCOUNTER — Encounter (HOSPITAL_BASED_OUTPATIENT_CLINIC_OR_DEPARTMENT_OTHER): Payer: PPO | Attending: Internal Medicine | Admitting: Internal Medicine

## 2023-01-09 ENCOUNTER — Encounter (HOSPITAL_BASED_OUTPATIENT_CLINIC_OR_DEPARTMENT_OTHER): Payer: PPO | Admitting: Internal Medicine

## 2023-01-09 DIAGNOSIS — N183 Chronic kidney disease, stage 3 unspecified: Secondary | ICD-10-CM | POA: Insufficient documentation

## 2023-01-09 DIAGNOSIS — I70235 Atherosclerosis of native arteries of right leg with ulceration of other part of foot: Secondary | ICD-10-CM | POA: Diagnosis not present

## 2023-01-09 DIAGNOSIS — Z89421 Acquired absence of other right toe(s): Secondary | ICD-10-CM | POA: Insufficient documentation

## 2023-01-09 DIAGNOSIS — E1122 Type 2 diabetes mellitus with diabetic chronic kidney disease: Secondary | ICD-10-CM | POA: Insufficient documentation

## 2023-01-09 DIAGNOSIS — L97512 Non-pressure chronic ulcer of other part of right foot with fat layer exposed: Secondary | ICD-10-CM | POA: Diagnosis present

## 2023-01-09 DIAGNOSIS — L97514 Non-pressure chronic ulcer of other part of right foot with necrosis of bone: Secondary | ICD-10-CM | POA: Insufficient documentation

## 2023-01-09 DIAGNOSIS — Z794 Long term (current) use of insulin: Secondary | ICD-10-CM | POA: Insufficient documentation

## 2023-01-09 DIAGNOSIS — E11621 Type 2 diabetes mellitus with foot ulcer: Secondary | ICD-10-CM | POA: Insufficient documentation

## 2023-01-09 DIAGNOSIS — J449 Chronic obstructive pulmonary disease, unspecified: Secondary | ICD-10-CM | POA: Diagnosis not present

## 2023-01-09 LAB — GLUCOSE, CAPILLARY: Glucose-Capillary: 212 mg/dL — ABNORMAL HIGH (ref 70–99)

## 2023-01-09 NOTE — Progress Notes (Signed)
[name], [name] ([id number]) [id number] Page 1 of 2 Visit Report for 01/09/2023 Arrival Information Details Patient Name: Date of Service: [name] 01/09/2023 12:00 PM Medical Record Number: [id number] Patient Account Number: [id number] Date of Birth/Sex: Treating RN: 30-Nov-1933 (89 y.o. [name] Primary Care [name]: [name] Other Clinician: [name] Referring [name]: Treating [name]/Extender: [name] in Treatment: 27 Visit Information History Since Last Visit All ordered tests and consults were completed: Yes Patient Arrived: Ambulatory Added or deleted any medications: No Arrival Time: 11:54 Any new allergies or adverse reactions: No Accompanied By: self Had a fall or experienced change in No Transfer Assistance: None activities of daily living that may affect Patient Identification Verified: Yes risk of falls: Secondary Verification Process Completed: Yes Signs or symptoms of abuse/neglect since last visito No Patient Requires Transmission-Based Precautions: No Hospitalized since last visit: No Patient Has Alerts: Yes Implantable device outside of the clinic excluding No Patient Alerts: Patient on Blood Thinner cellular tissue based products placed in the center since last visit: Pain Present Now: Yes Electronic Signature(s) Signed: 01/09/2023 4:30:33 PM By: [name] CHT EMT BS , , Previous Signature: 01/09/2023 4:12:10 PM Version By: [name] CHT EMT BS , , Entered By: [name] on 01/09/2023 16:30:33 -------------------------------------------------------------------------------- Encounter Discharge Information Details Patient Name: Date of Service: [name] 01/09/2023 12:00 PM Medical Record Number: [id number] Patient Account Number: [id number] Date of Birth/Sex: Treating RN: 10-Dec-1933 (89 y.o. [name] Primary Care  [name]: [name] Other Clinician: [name] Referring [name]: Treating [name]/Extender: [name] in Treatment: 27 Encounter Discharge Information Items Discharge Condition: Stable Ambulatory Status: Ambulatory Discharge Destination: Home Transportation: Private Auto Accompanied By: daughter Schedule Follow-up Appointment: No Clinical Summary of Care: Electronic Signature(s) Signed: 01/09/2023 4:29:46 PM By: [name] CHT EMT BS , , Entered By: [name] on 01/09/2023 16:29:46 [name], [name] ([id number]) [id number] Page 2 of 2 -------------------------------------------------------------------------------- Vitals Details Patient Name: Date of Service: [name] 01/09/2023 12:00 PM Medical Record Number: [id number] Patient Account Number: [id number] Date of Birth/Sex: Treating RN: [birth year] (89 y.o. [name] Primary Care [name]: [name] Other Clinician: [name] Referring [name]: Treating [name]/Extender: [name] in Treatment: 27 Vital Signs Time Taken: 12:55 Temperature (F): 98.4 Height (in): 71 Pulse (bpm): 110 Weight (lbs): 170 Respiratory Rate (breaths/min): 18 Body Mass Index (BMI): 23.7 Blood Pressure (mmHg): 165/91 Capillary Blood Glucose (mg/dl): 938 Reference Range: 80 - 120 mg / dl Electronic Signature(s) Signed: 01/09/2023 4:13:48 PM By: [name] CHT EMT BS , , Entered By: [name] on 01/09/2023 16:13:48

## 2023-01-09 NOTE — Progress Notes (Addendum)
[name], [name] ([id number]) [id number] Page 1 of 3 Visit Report for 01/09/2023 HBO Details Patient Name: Date of Service: [name] [name] 01/09/2023 12:00 PM Medical Record Number: [id number] Patient Account Number: [id number] Date of Birth/Sex: Treating RN: Jul 28, 1933 (89 y.o. [name], [name] Primary Care Amair Shrout: [name] Other Clinician: Haywood Pao Referring Camden Mazzaferro: Treating Linnie Delgrande/Extender: [name] in Treatment: 27 HBO Treatment Course Details Treatment Course Number: 1 Ordering Kesi Perrow: [name] T Treatments Ordered: otal 40 HBO Treatment Start Date: 01/09/2023 HBO Indication: Diabetic Ulcer(s) of the Lower Extremity Standard/Conservative Wound Care tried and failed greater than or equal to 30 days Wound #3 Right Amputation Site - T oe HBO Treatment Details Treatment Number: 1 Patient Type: Outpatient Chamber Type: Monoplace Chamber Serial #: [id number] Treatment Protocol: 2.0 ATA with 90 minutes oxygen, and no air breaks Treatment Details Compression Rate Down: 1.0 psi / minute De-Compression Rate Up: 1.0 psi / minute Air breaks and breathing Decompress Decompress Compress Tx Pressure Begins Reached periods Begins Ends (leave unused spaces blank) Chamber Pressure (ATA 1 2 ------2 1 ) Clock Time (24 hr) 13:12 13:32 - - - - - - 15:02 15:23 Treatment Length: 131 (minutes) Treatment Segments: 4 Vital Signs Capillary Blood Glucose Reference Range: 80 - 120 mg / dl HBO Diabetic Blood Glucose Intervention Range: <131 mg/dl or >782 mg/dl Type: Time Vitals Blood Pulse: Respiratory Temperature: Capillary Blood Glucose Pulse Action Taken: Pressure: Rate: Glucose (mg/dl): Meter #: Oximetry (%) Taken: Pre 12:55 165/91 110 18 98.4 212 physician made aware of pulse rate. Post 15:27 162/98 85 18 97.3 120 none per protocol Treatment Response Treatment Toleration: Well Treatment  Completion Status: Treatment Completed without Adverse Event Treatment Notes Mr. Guidroz arrived with vital signs within normal range. He prepared for the treatment. After introductory preliminaries with the physician, a safety check was performed. Afterwards, the patient was placed in the chamber which was compressed at a rate of 1 psi/min with ventilation set at 450 L/min which effectively slows travel rate to 0.74 psi/min. He tolerated this travel and ventilation rate of the chamber was decreased to 275 L/min. He tolerated the treatment after finding some entertainment on the television. Mr. Revelo tolerated the decompression of the chamber at 1 psi/min. He denied any issues with ear equalization and/or pain associated with barotrauma. His post-treatment vital signs were within normal range. He was stable upon discharge with his daughter. Additional Procedure Documentation Tissue Sevierity: Fat layer exposed Ceana Fiala Notes Patient was seen prior to hyperbaric treatment because of his first treatment today. Respiratory and cardiac exams were normal. Tympanic membranes look normal. He tolerated the treatment well. He was seen for wound care evaluation posttreatment SISTRUNK, Zaryan ([id number]) [id number] Page 2 of 3 Physician HBO Attestation: I certify that I supervised this HBO treatment in accordance with Medicare guidelines. A trained emergency response team is readily available per Yes hospital policies and procedures. Continue HBOT as ordered. Yes Electronic Signature(s) Signed: 01/09/2023 4:28:02 PM By: Haywood Pao CHT EMT BS , , Signed: 01/09/2023 4:50:34 PM By: [name] MD Entered By: Haywood Pao on 01/09/2023 13:28:01 -------------------------------------------------------------------------------- HBO Safety Checklist Details Patient Name: Date of Service: [name] [name] 01/09/2023 12:00 PM Medical Record Number:  [id number] Patient Account Number: [id number] Date of Birth/Sex: Treating RN: 04/11/1933 (89 y.o. [name] Primary Care Zollie Ellery: [name] Other Clinician: Haywood Pao Referring Yacine Garriga: Treating Krystena Reitter/Extender: [name] in Treatment: 27 HBO Safety Checklist Items Safety Checklist Consent Form  Signed Patient voided / foley secured and emptied When did you last eato 1000 Last dose of injectable or oral agent 0800 Jardiance Ostomy pouch emptied and vented if applicable NA All implantable devices assessed, documented and approved NA Intravenous access site secured and place NA Valuables secured Linens and cotton and cotton/polyester blend (less than 51% polyester) Personal oil-based products / skin lotions / body lotions removed Wigs or hairpieces removed NA Smoking or tobacco materials removed NA Books / newspapers / magazines / loose paper removed Cologne, aftershave, perfume and deodorant removed Jewelry removed (may wrap wedding band) Make-up removed NA Hair care products removed Battery operated devices (external) removed Heating patches and chemical warmers removed Titanium eyewear removed Nail polish cured greater than 10 hours NA Casting material cured greater than 10 hours NA Hearing aids removed removed Loose dentures or partials removed dentures removed Prosthetics have been removed NA Patient demonstrates correct use of air break device (if applicable) Patient concerns have been addressed Patient grounding bracelet on and cord attached to chamber Specifics for Inpatients (complete in addition to above) Medication sheet sent with patient NA Intravenous medications needed or due during therapy sent with patient NA Drainage tubes (e.g. nasogastric tube or chest tube secured and vented) NA Endotracheal or Tracheotomy tube secured NA Cuff deflated of air and inflated with saline NA Airway  suctioned NA Notes Mr. Italiano arrived with vital signs within normal range. He prepared for the treatment. After introductory preliminaries with the physician, a safety check was performed. Afterwards, the patient was placed in the chamber which was compressed at a rate of 1 psi/min with ventilation set at 450 L/min which Jacob, Hawthorne ([id number]) [id number] Page 3 of 3 effectively slows travel rate to 0.74 psi/min. He tolerated this travel and ventilation rate of the chamber was decreased to 275 L/min. He tolerated the treatment after finding some entertainment on the television. Mr. Hettinger tolerated the decompression of the chamber at 1 psi/min. He denied any issues with ear equalization and/or pain associated with barotrauma. His post-treatment vital signs were within normal range. He was stable upon discharge with his daughter. Electronic Signature(s) Signed: 01/09/2023 4:19:27 PM By: Haywood Pao CHT EMT BS , , Entered By: Haywood Pao on 01/09/2023 13:19:27

## 2023-01-09 NOTE — Progress Notes (Signed)
[name], [name] ([id number]) [id number] Page 1 of 1 Visit Report for 01/09/2023 SuperBill Details Patient Name: Date of Service: [name] [name] 01/09/2023 Medical Record Number: [id number] Patient Account Number: [id number] Date of Birth/Sex: Treating RN: 05/29/1933 (89 y.o. 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 E11.621 Type 2 diabetes mellitus with foot ulcer I70.235 Atherosclerosis of native arteries of right leg with ulceration of other part of foot Facility Procedures CPT4 Code Description Modifier Quantity [id number]-(Facility Use Only) HBOT full body chamber, , 4 ICD-10 Diagnosis Description E11.621 Type 2 diabetes mellitus with foot ulcer 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.512 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.235 Atherosclerosis of native arteries of right leg with ulceration of other part of foot Physician Procedures Quantity CPT4 Code Description Modifier [id number] - WC PHYS HYPERBARIC OXYGEN THERAPY 1 ICD-10 Diagnosis Description E11.621 Type 2 diabetes mellitus with foot ulcer 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.512 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

## 2023-01-10 ENCOUNTER — Encounter (HOSPITAL_BASED_OUTPATIENT_CLINIC_OR_DEPARTMENT_OTHER): Payer: PPO | Admitting: Internal Medicine

## 2023-01-10 DIAGNOSIS — E11621 Type 2 diabetes mellitus with foot ulcer: Secondary | ICD-10-CM | POA: Diagnosis not present

## 2023-01-10 LAB — GLUCOSE, CAPILLARY
Glucose-Capillary: 120 mg/dL — ABNORMAL HIGH (ref 70–99)
Glucose-Capillary: 235 mg/dL — ABNORMAL HIGH (ref 70–99)

## 2023-01-10 NOTE — Progress Notes (Signed)
[name] ([id number]) [id number] Page 1 of 2 Visit Report for 01/10/2023 HBO Details Patient Name: Date of Service: [name] [name] 01/10/2023 12:00 PM Medical Record Number: [id number] Patient Account Number: [id number] Date of Birth/Sex: Treating RN: [birth year] (89 y.o. [name] Primary Care Crystel Demarco: [name] Other Clinician: [name] Referring Duvall Comes: Treating Melyna Huron/Extender: [name] in Treatment: 27 HBO Treatment Course Details Treatment Course Number: 1 Ordering Sujay Grundman: [name] T Treatments Ordered: otal 40 HBO Treatment Start Date: 01/09/2023 HBO Indication: Diabetic Ulcer(s) of the Lower Extremity Standard/Conservative Wound Care tried and failed greater than or equal to 30 days Wound #3 Right Amputation Site - T oe HBO Treatment Details Treatment Number: 2 Patient Type: Outpatient Chamber Type: Monoplace Chamber Serial #: [id number] Treatment Protocol: 2.0 ATA with 90 minutes oxygen, and no air breaks Treatment Details Compression Rate Down: 1.0 psi / minute De-Compression Rate Up: 1.0 psi / minute Air breaks and breathing Decompress Decompress Compress Tx Pressure Begins Reached periods Begins Ends (leave unused spaces blank) Chamber Pressure (ATA 1 2 ------2 1 ) Clock Time (24 hr) 12:54 13:10 - - - - - - 14:40 14:56 Treatment Length: 122 (minutes) Treatment Segments: 4 Vital Signs Capillary Blood Glucose Reference Range: 80 - 120 mg / dl HBO Diabetic Blood Glucose Intervention Range: <131 mg/dl or >527 mg/dl Time Vitals Blood Respiratory Capillary Blood Glucose Pulse Action Type: Pulse: Temperature: Taken: Pressure: Rate: Glucose (mg/dl): Meter #: Oximetry (%) Taken: Pre 12:29 115/73 91 18 97.3 235 Post 15:01 139/93 92 18 98.1 146 Treatment Response Treatment Toleration: Well Treatment Completion Status: Treatment Completed without Adverse  Event Additional Procedure Documentation Tissue Sevierity: Necrosis of bone Jeannifer Drakeford Notes No concern with treatment given Physician HBO Attestation: I certify that I supervised this HBO treatment in accordance with Medicare guidelines. A trained emergency response team is readily available per Yes hospital policies and procedures. Continue HBOT as ordered. Yes Electronic Signature(s) [place], Rahm ([id number]) [id number] Page 2 of 2 Signed: 01/10/2023 4:32:14 PM By: [name] MD Previous Signature: 01/10/2023 3:56:29 PM Version By: [name] Entered By: [name] on 01/10/2023 16:30:52 -------------------------------------------------------------------------------- HBO Safety Checklist Details Patient Name: Date of Service: [name] [name] 01/10/2023 12:00 PM Medical Record Number: [id number] Patient Account Number: [id number] Date of Birth/Sex: Treating RN: 04/19/1933 (89 y.o. [name] Primary Care Hollie Wojahn: [name] Other Clinician: [name] Referring [name]: Treating [name]/Extender: [name] in Treatment: 27 HBO Safety Checklist Items Safety Checklist Consent Form Signed Patient voided / foley secured and emptied When did you last eato 1000 Last dose of injectable or oral agent 0830 Ostomy pouch emptied and vented if applicable NA All implantable devices assessed, documented and approved NA Intravenous access site secured and place NA Valuables secured Linens and cotton and cotton/polyester blend (less than 51% polyester) Personal oil-based products / skin lotions / body lotions removed Wigs or hairpieces removed NA Smoking or tobacco materials removed Books / newspapers / magazines / loose paper removed Cologne, aftershave, perfume and deodorant removed Jewelry removed (may wrap wedding band) Make-up removed NA Hair care products removed Battery operated  devices (external) removed Heating patches and chemical warmers removed Titanium eyewear removed NA Nail polish cured greater than 10 hours NA Casting material cured greater than 10 hours NA Hearing aids removed removed Loose dentures or partials removed removed by patient Prosthetics have been removed NA Patient demonstrates correct use of air break device (if applicable) Patient concerns  have been addressed Patient grounding bracelet on and cord attached to chamber Specifics for Inpatients (complete in addition to above) Medication sheet sent with patient NA Intravenous medications needed or due during therapy sent with patient NA Drainage tubes (e.g. nasogastric tube or chest tube secured and vented) NA Endotracheal or Tracheotomy tube secured NA Cuff deflated of air and inflated with saline NA Airway suctioned NA Electronic Signature(s) Signed: 01/10/2023 3:53:20 PM By: [name] Entered By: [name] on 01/10/2023 15:53:20

## 2023-01-10 NOTE — Progress Notes (Addendum)
[name], [name] ([id number]) [id number] Page 1 of 9 Visit Report for 01/09/2023 Debridement Details Patient Name: Date of Service: [name] 01/09/2023 2:45 PM Medical Record Number: [id number] Patient Account Number: [id number] Date of Birth/Sex: Treating RN: [birth year] (89 y.o. M) Primary Care Provider: [name] Other Clinician: Referring Provider: Treating Provider/Extender: [name] in Treatment: 27 Debridement Performed for Assessment: Wound #4 Right,Lateral Foot Performed By: Physician Maxwell Caul., MD The following information was scribed by: Shawn Stall The information was scribed for: [name] Debridement Type: Debridement Severity of Tissue Pre Debridement: Fat layer exposed Level of Consciousness (Pre-procedure): Awake and Alert Pre-procedure Verification/Time Out Yes - 15:58 Taken: Start Time: 15:59 Pain Control: Lidocaine 4% T opical Solution Percent of Wound Bed Debrided: 100% T Area Debrided (cm): otal 6.12 Tissue and other material debrided: Non-Viable, Slough, Slough Level: Non-Viable Tissue Debridement Description: Selective/Open Wound Instrument: Curette, Scissors Bleeding: Minimum Hemostasis Achieved: Pressure End Time: 16:06 Procedural Pain: 0 Post Procedural Pain: 0 Response to Treatment: Procedure was tolerated well Level of Consciousness (Post- Awake and Alert procedure): Post Debridement Measurements of Total Wound Length: (cm) 3 Width: (cm) 2.6 Depth: (cm) 0.1 Volume: (cm) 0.613 Character of Wound/Ulcer Post Debridement: Requires Further Debridement Severity of Tissue Post Debridement: Fat layer exposed Post Procedure Diagnosis Same as Pre-procedure Electronic Signature(s) Signed: 01/09/2023 4:50:34 PM By: [name] MD Entered By: [name] on 01/09/2023  16:13:30 -------------------------------------------------------------------------------- HPI Details Patient Name: Date of Service: [name] 01/09/2023 2:45 PM Medical Record Number: [id number] Patient Account Number: [id number] Date of Birth/Sex: Treating RN: Jan 27, 1934 (89 y.o. M) Primary Care Provider: [name] Other Clinician: Referring Provider: Treating Provider/Extender: [name] in Treatment: [address], [name] ([id number]) [id number] Page 2 of 9 History of Present Illness HPI Description: 07/04/2022 [name] is an 89 year old male with a past medical history of uncontrolled, insulin dependent type 2 diabetes, CKD stage III, and COPD that presents to the clinic for a right foot wound. He has an extensive history of peripheral arterial disease to the right leg. He has had a right superficial femoral to posterior tibial artery bypass by Dr. Elonda Husky in 2020. He had a right common femoral to plantar posterior tibial artery bypass on 02/03/2022 by Dr. Lenell Antu and at that time he also had a right toe amputation. He states this wound site had healed completely. Prior to this he had amputations to the first and second digit to the right foot. He presents today with an open wound to the previous right toe amputation site for the past 2 months. He has been following with vein and vascular for this issue. Most recent ABIs were on 06/22/2022 that showed absent waveforms in the right lower extremity. Currently his right lower extremity bypass is occluded and no plan for another anteriorgram and has he likely has no revascularization options. Currently he has been using Betadine to the wound bed. He denies signs of infection. 07/11/2022 patient arrives in clinic today using Hydrofera Blue on the toe amputation site. The patient is revascularized and is not felt to have any other options for revascularization.  We are using Hydrofera Blue and gauze. 5/13; patient presents for follow-up. He has been using Hydrofera Blue to the toe amputation site. He has had no issues. Wound is smaller. He has been approved for [facility] and was agreeable to have this placed in office today. 5/20; patient presents for follow-up. We have been using Kerecis to the  wound bed. There is been improvement in healing. Unfortunately patient did not change the outer dressing for a week. There is slight maceration to the periwound. Unclear why he did not do this. 5/30; patient presents for follow-up. We have been using Kerecis to the wound bed. More maceration to the periwound today. No signs of infection. 6/10; patient presents for follow-up. He has been using Hydrofera Blue to the wound bed. He has no issues or complaints today. Maceration has almost completely resolved. 6/18; patient presents for follow-up. He has been using blast X and collagen to the wound bed without issues. Wound appears smaller today. 6/28; patient presents for follow-up. He has been using blast X and collagen to the wound bed. The wound is smaller. 7/11; patient presents for follow-up. He has been using blast X and collagen to the wound bed. Wound is smaller. He has slough accumulation. 7/25; patient presents for follow-up. He has been using blast X and collagen to the wound bed. Wound is stable. He denies signs of infection. 8/26; patient presents for follow-up. He has been using blast X and collagen to the wound bed. Wound is slightly smaller. He has no issues or complaints. 8/29; patient presents for follow-up. He has been using blast X and Aquacel Ag to the wound bed. He has been using a Velcro wrap at his toes to keep the toes closer together to help with wound healing. He has no issues or complaints today. Wound is smaller. 9/12; patient presents for follow-up. He has been using blast X and Aquacel Ag to the wound bed. Unfortunately he has developed a  pressure injury to the lateral aspect of the foot. He is not sure how this started. No open wound there. He denies pain. 9/23; this patient has a wound on the right foot at the amputation site of her third toe. It is right up against the fourth toe making it somewhat difficult to dress. When he was here last time he had a blister/DTI on the lateral aspect of the fifth MTP 9/30; patient has a wound on the right foot amputation site of his third toe. Probably in the third fourth webspace. I changed him to endoform last week. The wound looks somewhat better The area over the lateral part of the fifth metatarsal head which was blistered with some central deep tissue looking injury was leaking fluid today. The blister was unroofed. I used pickups and scissors to remove this. I do not know that there is anything technically open here although there is certainly some central tissue at risk 10/7; his original wound at the amputation site in his third fourth webspace looks stable to improved. We have been using endoform. The real concern today is the area over the lateral part of the fifth metatarsal head. This initially looked like a blister with central deep tissue injury I unroofed the blister last week there was nothing technically open but the deep tissue injury was concerning. T oday he comes in with a necrotic surface I remove this. The central part of this area which I am assuming was the deep tissue injury is necrotic I removed some of the necrotic tissue with a #15 scalpel and pickups. There is undermining around the wound edge here. 10/14; his original wound at the amputation site of his third fourth toe webspace looks improved we have gradually been making progress here. Using endoform. Unfortunately the area over the lateral part of the right fifth metatarsal head continues to deteriorate. Necrotic surface  last week continues this week. The PCR culture I did last week did not manage to make it  to the right lab. I have looked at his x-ray the bone underneath does not look that bad to me however we have not yet had a radiologist interpretation. We have been using Iodoflex this week. 10/21; the original amputation site of his third fourth toe webspace continues to look reasonably healthy and making slow but steady progress we have been using endoform The area on the right lateral fifth metatarsal head has been deteriorating problem. PCR culture grew MRSA. I have him on doxycycline. There is exposed bone in the wound under necrotic material. I still do not have the x-ray back from October 7 at least in terms of the report. Addendum 12/27/2022; I have looked back on this patient's vascular status. He is status post a right common femoral to posterior tibial artery bypass in 2020. He had a right common femoral to plantar posterior tibial bypass by Dr. Lenell Antu in November 2023. He also had a right third toe amputation at the same time. The wound that we have been dealing with the third toe amputation site was present after the surgery. He was last seen by vascular surgery on 06/21/2022 and was supposed to follow-up with Dr. Lenell Antu in 3 weeks although I do not see that appointment. I have included the major findings in the last right lower extremity angiogram on 01/21/2022 10/28;Marland Kitchen The x-ray report that I ordered several weeks ago is back. Indeed there was nothing under the fifth met head on the right however notable for bone necrosis under the wound on the left third toe. This suggest osteomyelitis. I am going to extend his doxycycline for 3 further weeks. PCR culture on the lateral foot grew MRSA I sent a secure text message to Dr. Juanetta Gosling. His reply was that there was no additional options for revascularization. The patient went to see Dr. Juanetta Gosling and that is the message they came up with. He did offer him an amputation [o Above-knee]. We have been using endoform to the third toe amputation site.  Silver alginate to the new wound over the fifth metatarsal head 11/4; patient continues on doxycycline which she is handling well. He started hyperbaric oxygen today and tolerated the first treatment well. We have been using endoform to the third toe amputation site which was the original wound he came to the clinic for. We are using Santyl and silver alginate to the wound [name], [name] ([id number]) [id number] Page 3 of 9 over the fifth metatarsal head which was the wound that opened while he was under observation. Culture grew MRSA and the wound over the fifth metatarsal head Electronic Signature(s) Signed: 01/09/2023 4:50:34 PM By: [name] MD Entered By: [name] on 01/09/2023 16:14:59 -------------------------------------------------------------------------------- Physical Exam Details Patient Name: Date of Service: [name] 01/09/2023 2:45 PM Medical Record Number: [id number] Patient Account Number: [id number] Date of Birth/Sex: Treating RN: [birth year] (89 y.o. M) Primary Care Provider: [name] Other Clinician: Referring Provider: Treating Provider/Extender: [name] in Treatment: 27 Notes Wound exam; Third metatarsal amputation site. This continues to contract and looks healthy. No debridement was required. Lateral fifth metatarsal head I used pickups and scissors to remove some surface slough. He has a rim of what looks to be healthy granulation around this. No more aggressive debridement was felt to be necessary. Electronic Signature(s) Signed: 01/09/2023 4:50:34 PM By: [name] MD Entered By: [name] on  01/09/2023 16:16:14 -------------------------------------------------------------------------------- Physician Orders Details Patient Name: Date of Service: [name] 01/09/2023 2:45 PM Medical Record Number: [id number] Patient Account Number:  [id number] Date of Birth/Sex: Treating RN: October 21, 1933 (89 y.o. [name] Primary Care Provider: [name] Other Clinician: Referring Provider: Treating Provider/Extender: [name] in Treatment: 27 The following information was scribed by: Shawn Stall The information was scribed for: [name] Verbal / Phone Orders: No Diagnosis Coding ICD-10 Coding Code Description L97.512 Non-pressure chronic ulcer of other part of right foot with fat layer exposed L97.514 Non-pressure chronic ulcer of other part of right foot with necrosis of bone E11.621 Type 2 diabetes mellitus with foot ulcer I70.235 Atherosclerosis of native arteries of right leg with ulceration of other part of foot Follow-up Appointments ppointment in 1 week. - Dr. Leanord Hawking 01/16/23 245pm Return A ppointment in 2 weeks. - Dr. Leanord Hawking before or after hyperbarics [facility] office to schedule Monday- Thursday) Return A Return appointment in 1 month. - Dr. Leanord Hawking before or after hyperbarics [facility] office to schedule Monday- Thursday) Nurse Visit: - 01/30/2023 Monday Anesthetic (In clinic) Topical Lidocaine 5% applied to wound bed Wiers, Braison ([id number]) [id number] Page 4 of 9 Bathing/ Shower/ Hygiene May shower with protection but do not get wound dressing(s) wet. Protect dressing(s) with water repellant cover (for example, large plastic bag) or a cast cover and may then take shower. Hyperbaric Oxygen Therapy Wound #3 Right Amputation Site - Toe Evaluate for HBO Therapy Indication: - Wagner Grade 3 If appropriate for treatment, begin HBOT per protocol: 2.0 ATA for 90 Minutes without A Breaks ir Total Number of Treatments: - 30 One treatments per day (delivered Monday through Friday unless otherwise specified in Special Instructions below): Finger stick Blood Glucose Pre- and Post- HBOT Treatment. Follow Hyperbaric Oxygen Glycemia Protocol Give  two 4oz orange juices in addition to Glucerna when the glycemic protocol is used. A frin (Oxymetazoline HCL) 0.05% nasal spray - 1 spray in both nostrils daily as needed prior to HBO treatment for difficulty clearing ears Wound Treatment Wound #3 - Amputation Site - Toe Wound Laterality: Right Cleanser: Vashe 5.8 (oz) Every Other Day/15 Days Discharge Instructions: Cleanse the wound with Vashe prior to applying a clean dressing using gauze sponges, not tissue or cotton balls. Topical: Skintegrity Hydrogel 4 (oz) Every Other Day/15 Days Discharge Instructions: Apply hydrogel as directed Prim Dressing: Endoform 2x2 in Every Other Day/15 Days ary Discharge Instructions: Moisten with saline Secondary Dressing: Woven Gauze Sponge, Non-Sterile 4x4 in (Generic) Every Other Day/15 Days Discharge Instructions: Apply over primary dressing as directed. Secured With: Paper Tape, 2x10 (in/yd) Every Other Day/15 Days Discharge Instructions: Secure dressing with tape as directed. Wound #4 - Foot Wound Laterality: Right, Lateral Cleanser: Soap and Water 1 x Per Day/30 Days Discharge Instructions: May shower and wash wound with dial antibacterial soap and water prior to dressing change. Cleanser: Wound Cleanser 1 x Per Day/30 Days Discharge Instructions: Cleanse the wound with wound cleanser prior to applying a clean dressing using gauze sponges, not tissue or cotton balls. Peri-Wound Care: Skin Prep 1 x Per Day/30 Days Discharge Instructions: Use skin prep as directed Prim Dressing: Maxorb Extra Ag+ Alginate Dressing, 2x2 (in/in) 1 x Per Day/30 Days ary Discharge Instructions: Apply over the santyl as instructed Prim Dressing: Santyl Ointment 1 x Per Day/30 Days ary Discharge Instructions: Apply nickel thick amount to wound bed as instructed Secondary Dressing: Optifoam Non-Adhesive Dressing, 4x4 in 1 x Per Day/30 Days Discharge  Instructions: Apply foam donut. Secondary Dressing: Woven Gauze Sponges  2x2 in 1 x Per Day/30 Days Discharge Instructions: Apply over primary dressing as directed. Secured With: Insurance underwriter, Sterile 2x75 (in/in) 1 x Per Day/30 Days Discharge Instructions: Secure with stretch gauze as directed. Secured With: 7M Medipore H Soft Cloth Surgical T ape, 4 x 10 (in/yd) 1 x Per Day/30 Days Discharge Instructions: Secure with tape as directed. GLYCEMIA INTERVENTIONS PROTOCOL PRE-HBO GLYCEMIA INTERVENTIONS ACTION INTERVENTION Obtain pre-HBO capillary blood glucose (ensure 1 physician order is in chart). A. Notify HBO physician and await physician orders. 2 If result is 70 mg/dl or below: B. If the result meets the hospital definition of a critical result, follow hospital policy. A. Give patient an 8 ounce Glucerna Shake, an 8 ounce Ensure, or 8 ounces of a Glucerna/Ensure equivalent dietary supplement*. B. Wait 30 minutes. If result is 71 mg/dl to 086 mg/dl: C. Retest patients capillary blood glucose (CBG). [name], [name] ([id number]) [id number] Page 5 of 9 D. If result greater than or equal to 110 mg/dl, proceed with HBO. If result less than 110 mg/dl, notify HBO physician and consider holding HBO. If result is 131 mg/dl to 528 mg/dl: A. Proceed with HBO. A. Notify HBO physician and await physician orders. B. It is recommended to hold HBO and do If result is 250 mg/dl or greater: blood/urine ketone testing. C. If the result meets the hospital definition of a critical result, follow hospital policy. POST-HBO GLYCEMIA INTERVENTIONS ACTION INTERVENTION Obtain post HBO capillary blood glucose (ensure 1 physician order is in chart). A. Notify HBO physician and await physician orders. 2 If result is 70 mg/dl or below: B. If the result meets the hospital definition of a critical result, follow hospital policy. A. Give patient an 8 ounce Glucerna Shake, an 8 ounce Ensure, or 8 ounces of  a Glucerna/Ensure equivalent dietary supplement*. B. Wait 15 minutes for symptoms of If result is 71 mg/dl to 413 mg/dl: hypoglycemia (i.e. nervousness, anxiety, sweating, chills, clamminess, irritability, confusion, tachycardia or dizziness). C. If patient asymptomatic, discharge patient. If patient symptomatic, repeat capillary blood glucose (CBG) and notify HBO physician. If result is 101 mg/dl to 244 mg/dl: A. Discharge patient. A. Notify HBO physician and await physician orders. B. It is recommended to do blood/urine ketone If result is 250 mg/dl or greater: testing. C. If the result meets the hospital definition of a critical result, follow hospital policy. *Juice or candies are NOT equivalent products. If patient refuses the Glucerna or Ensure, please consult the hospital dietitian for an appropriate substitute. Electronic Signature(s) Signed: 01/09/2023 4:50:34 PM By: [name] MD Signed: 01/10/2023 12:20:53 PM By: Shawn Stall RN, BSN Entered By: Shawn Stall on 01/09/2023 16:08:12 -------------------------------------------------------------------------------- Problem List Details Patient Name: Date of Service: [name] 01/09/2023 2:45 PM Medical Record Number: [id number] Patient Account Number: [id number] Date of Birth/Sex: Treating RN: 15-Nov-1933 (89 y.o. [name], [name] Primary Care Provider: [name] Other Clinician: Referring Provider: Treating Provider/Extender: [name] in Treatment: 27 Active Problems ICD-10 Encounter Code Description Active Date MDM Diagnosis L97.512 Non-pressure chronic ulcer of other part of right foot with fat layer exposed 07/04/2022 No Yes L97.514 Non-pressure chronic ulcer of other part of right foot with necrosis of bone 12/26/2022 No Yes E11.621 Type 2 diabetes mellitus with foot ulcer 07/04/2022 No Yes I70.235 Atherosclerosis of native arteries of right leg with ulceration of  other part of 07/04/2022 No Yes [name], [name] ([id number]) [id number] Page  6 of 9 foot Inactive Problems Resolved Problems Electronic Signature(s) Signed: 01/09/2023 4:50:34 PM By: [name] MD Entered By: [name] on 01/09/2023 16:13:06 -------------------------------------------------------------------------------- Progress Note Details Patient Name: Date of Service: [name] 01/09/2023 2:45 PM Medical Record Number: [id number] Patient Account Number: [id number] Date of Birth/Sex: Treating RN: December 30, 1933 (89 y.o. M) Primary Care Provider: [name] Other Clinician: Referring Provider: Treating Provider/Extender: [name] in Treatment: 27 Subjective History of Present Illness (HPI) 07/04/2022 Mr. Romero Giunta is an 89 year old male with a past medical history of uncontrolled, insulin dependent type 2 diabetes, CKD stage III, and COPD that presents to the clinic for a right foot wound. He has an extensive history of peripheral arterial disease to the right leg. He has had a right superficial femoral to posterior tibial artery bypass by Dr. Elonda Husky in 2020. He had a right common femoral to plantar posterior tibial artery bypass on 02/03/2022 by Dr. Lenell Antu and at that time he also had a right toe amputation. He states this wound site had healed completely. Prior to this he had amputations to the first and second digit to the right foot. He presents today with an open wound to the previous right toe amputation site for the past 2 months. He has been following with vein and vascular for this issue. Most recent ABIs were on 06/22/2022 that showed absent waveforms in the right lower extremity. Currently his right lower extremity bypass is occluded and no plan for another anteriorgram and has he likely has no revascularization options. Currently he has been using Betadine to the wound bed. He denies  signs of infection. 07/11/2022 patient arrives in clinic today using Hydrofera Blue on the toe amputation site. The patient is revascularized and is not felt to have any other options for revascularization. We are using Hydrofera Blue and gauze. 5/13; patient presents for follow-up. He has been using Hydrofera Blue to the toe amputation site. He has had no issues. Wound is smaller. He has been approved for [facility] and was agreeable to have this placed in office today. 5/20; patient presents for follow-up. We have been using Kerecis to the wound bed. There is been improvement in healing. Unfortunately patient did not change the outer dressing for a week. There is slight maceration to the periwound. Unclear why he did not do this. 5/30; patient presents for follow-up. We have been using Kerecis to the wound bed. More maceration to the periwound today. No signs of infection. 6/10; patient presents for follow-up. He has been using Hydrofera Blue to the wound bed. He has no issues or complaints today. Maceration has almost completely resolved. 6/18; patient presents for follow-up. He has been using blast X and collagen to the wound bed without issues. Wound appears smaller today. 6/28; patient presents for follow-up. He has been using blast X and collagen to the wound bed. The wound is smaller. 7/11; patient presents for follow-up. He has been using blast X and collagen to the wound bed. Wound is smaller. He has slough accumulation. 7/25; patient presents for follow-up. He has been using blast X and collagen to the wound bed. Wound is stable. He denies signs of infection. 8/26; patient presents for follow-up. He has been using blast X and collagen to the wound bed. Wound is slightly smaller. He has no issues or complaints. 8/29; patient presents for follow-up. He has been using blast X and Aquacel Ag to the wound bed. He has been using a Velcro  wrap at his toes to keep the toes closer together to help with  wound healing. He has no issues or complaints today. Wound is smaller. 9/12; patient presents for follow-up. He has been using blast X and Aquacel Ag to the wound bed. Unfortunately he has developed a pressure injury to the lateral aspect of the foot. He is not sure how this started. No open wound there. He denies pain. 9/23; this patient has a wound on the right foot at the amputation site of her third toe. It is right up against the fourth toe making it somewhat difficult to dress. When he was here last time he had a blister/DTI on the lateral aspect of the fifth MTP 9/30; patient has a wound on the right foot amputation site of his third toe. Probably in the third fourth webspace. I changed him to endoform last week. The wound looks somewhat better The area over the lateral part of the fifth metatarsal head which was blistered with some central deep tissue looking injury was leaking fluid today. The blister was unroofed. I used pickups and scissors to remove this. I do not know that there is anything technically open here although there is certainly some central tissue at risk [name], [name] ([id number]) [id number] Page 7 of 9 10/7; his original wound at the amputation site in his third fourth webspace looks stable to improved. We have been using endoform. The real concern today is the area over the lateral part of the fifth metatarsal head. This initially looked like a blister with central deep tissue injury I unroofed the blister last week there was nothing technically open but the deep tissue injury was concerning. T oday he comes in with a necrotic surface I remove this. The central part of this area which I am assuming was the deep tissue injury is necrotic I removed some of the necrotic tissue with a #15 scalpel and pickups. There is undermining around the wound edge here. 10/14; his original wound at the amputation site of his third fourth toe webspace looks  improved we have gradually been making progress here. Using endoform. Unfortunately the area over the lateral part of the right fifth metatarsal head continues to deteriorate. Necrotic surface last week continues this week. The PCR culture I did last week did not manage to make it to the right lab. I have looked at his x-ray the bone underneath does not look that bad to me however we have not yet had a radiologist interpretation. We have been using Iodoflex this week. 10/21; the original amputation site of his third fourth toe webspace continues to look reasonably healthy and making slow but steady progress we have been using endoform The area on the right lateral fifth metatarsal head has been deteriorating problem. PCR culture grew MRSA. I have him on doxycycline. There is exposed bone in the wound under necrotic material. I still do not have the x-ray back from October 7 at least in terms of the report. Addendum 12/27/2022; I have looked back on this patient's vascular status. He is status post a right common femoral to posterior tibial artery bypass in 2020. He had a right common femoral to plantar posterior tibial bypass by Dr. Lenell Antu in November 2023. He also had a right third toe amputation at the same time. The wound that we have been dealing with the third toe amputation site was present after the surgery. He was last seen by vascular surgery on 06/21/2022 and was supposed to follow-up with  Dr. Lenell Antu in 3 weeks although I do not see that appointment. I have included the major findings in the last right lower extremity angiogram on 01/21/2022 10/28;Marland Kitchen The x-ray report that I ordered several weeks ago is back. Indeed there was nothing under the fifth met head on the right however notable for bone necrosis under the wound on the left third toe. This suggest osteomyelitis. I am going to extend his doxycycline for 3 further weeks. PCR culture on the lateral foot grew MRSA I sent a secure text  message to Dr. Juanetta Gosling. His reply was that there was no additional options for revascularization. The patient went to see Dr. Juanetta Gosling and that is the message they came up with. He did offer him an amputation [o Above-knee]. We have been using endoform to the third toe amputation site. Silver alginate to the new wound over the fifth metatarsal head 11/4; patient continues on doxycycline which she is handling well. He started hyperbaric oxygen today and tolerated the first treatment well. We have been using endoform to the third toe amputation site which was the original wound he came to the clinic for. We are using Santyl and silver alginate to the wound over the fifth metatarsal head which was the wound that opened while he was under observation. Culture grew MRSA and the wound over the fifth metatarsal head Objective Constitutional Vitals Time Taken: 3:27 PM, Height: 71 in, Weight: 170 lbs, BMI: 23.7, Temperature: 97.3 F, Pulse: 90 bpm, Respiratory Rate: 18 breaths/min, Blood Pressure: 162/98 mmHg, Capillary Blood Glucose: 85 mg/dl. Integumentary (Hair, Skin) Wound #3 status is Open. Original cause of wound was Gradually Appeared. The date acquired was: 04/07/2022. The wound has been in treatment 27 weeks. The wound is located on the Right Amputation Site - T The wound measures 0.4cm length x 0.2cm width x 0.1cm depth; 0.063cm^2 area and 0.006cm^3 volume. oe. There is Fat Layer (Subcutaneous Tissue) exposed. There is no tunneling or undermining noted. There is a medium amount of serosanguineous drainage noted. The wound margin is epibole. There is small (1-33%) red, pink granulation within the wound bed. There is a large (67-100%) amount of necrotic tissue within the wound bed including Adherent Slough. The periwound skin appearance exhibited: Maceration. The periwound skin appearance did not exhibit: Callus, Crepitus, Excoriation, Induration, Rash, Scarring, Dry/Scaly, Atrophie Blanche, Cyanosis,  Ecchymosis, Hemosiderin Staining, Mottled, Pallor, Rubor, Erythema. Periwound temperature was noted as No Abnormality. Wound #4 status is Open. Original cause of wound was Shear/Friction. The date acquired was: 11/21/2022. The wound has been in treatment 5 weeks. The wound is located on the Right,Lateral Foot. The wound measures 3cm length x 2.6cm width x 0.1cm depth; 6.126cm^2 area and 0.613cm^3 volume. There is Fat Layer (Subcutaneous Tissue) exposed. There is no tunneling or undermining noted. There is a medium amount of serosanguineous drainage noted. The wound margin is distinct with the outline attached to the wound base. There is no granulation within the wound bed. There is a large (67-100%) amount of necrotic tissue within the wound bed including Eschar and Adherent Slough. The periwound skin appearance exhibited: Maceration. The periwound skin appearance did not exhibit: Callus, Crepitus, Excoriation, Induration, Rash, Scarring, Dry/Scaly, Atrophie Blanche, Cyanosis, Ecchymosis, Hemosiderin Staining, Mottled, Pallor, Rubor, Erythema. 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 There was a Selective/Open Wound Non-Viable Tissue Debridement with a total area of 6.12 sq cm performed by Maxwell Caul., MD. With the following instrument(s): Curette, and Scissors to remove Non-Viable tissue/material. Material removed includes [facility] after  achieving pain control using Lidocaine 4% T opical Solution. A time out was conducted at 15:58, prior to the start of the procedure. A Minimum amount of bleeding was controlled with Pressure. The procedure was tolerated well with a pain level of 0 throughout and a pain level of 0 following the procedure. Post Debridement Measurements: 3cm length x 2.6cm width x 0.1cm depth; 0.613cm^3 volume. Character of Wound/Ulcer Post Debridement requires further debridement. Severity of Tissue Post Debridement is: Fat layer exposed. Post procedure Diagnosis Wound #4: Same as Pre-Procedure Plan Follow-up Appointments: Return Appointment in 1 week. - Dr. Leanord Hawking 01/16/23 245pm Return Appointment in 2 weeks. - Dr. Leanord Hawking before or after hyperbarics [facility] office to schedule Monday- Thursday) Return appointment in 1 month. - Dr. Leanord Hawking before or after hyperbarics [facility] office to schedule Monday- Thursday) Nurse Visit: - 01/30/2023 Monday Anesthetic: (In clinic) Topical Lidocaine 5% applied to wound bed Bathing/ Shower/ Hygiene: May shower with protection but do not get wound dressing(s) wet. Protect dressing(s) with water repellant cover (for example, large plastic bag) or a cast cover and may then take shower. Hyperbaric Oxygen Therapy: Wound #3 Right Amputation Site - T oe: Evaluate for HBO Therapy Indication: - Wagner Grade 3 If appropriate for treatment, begin HBOT per protocol: 2.0 ATA for 90 Minutes without Air Breaks T Number of Treatments: - 30 otal One treatments per day (delivered Monday through Friday unless otherwise specified in Special Instructions below): Finger stick Blood Glucose Pre- and Post- HBOT Treatment. Follow Hyperbaric Oxygen Glycemia Protocol Give two 4oz orange juices in addition to Glucerna when the glycemic protocol is used. Afrin (Oxymetazoline HCL) 0.05% nasal spray - 1 spray in both nostrils daily as needed prior to HBO treatment for difficulty clearing ears WOUND #3: -  Amputation Site - T oe Wound Laterality: Right Cleanser: Vashe 5.8 (oz) Every Other Day/15 Days Discharge Instructions: Cleanse the wound with Vashe prior to applying a clean dressing using gauze sponges, not tissue or cotton balls. Topical: Skintegrity Hydrogel 4 (oz) Every Other Day/15 Days Discharge Instructions: Apply hydrogel as directed Prim Dressing: Endoform 2x2 in Every Other Day/15 Days ary Discharge Instructions: Moisten with saline Secondary Dressing: Woven Gauze Sponge, Non-Sterile 4x4 in (Generic) Every Other Day/15 Days Discharge Instructions: Apply over primary dressing as directed. Secured With: Paper T ape, 2x10 (in/yd) Every Other Day/15 Days Discharge Instructions: Secure dressing with tape as directed. WOUND #4: - Foot Wound Laterality: Right, Lateral Cleanser: Soap and Water 1 x Per Day/30 Days Discharge Instructions: May shower and wash wound with dial antibacterial soap and water prior to dressing change. Cleanser: Wound Cleanser 1 x Per Day/30 Days Discharge Instructions: Cleanse the wound with wound cleanser prior to applying a clean dressing using gauze sponges, not tissue or cotton balls. Peri-Wound Care: Skin Prep 1 x Per Day/30 Days Discharge Instructions: Use skin prep as directed Prim Dressing: Maxorb Extra Ag+ Alginate Dressing, 2x2 (in/in) 1 x Per Day/30 Days ary Discharge Instructions: Apply over the santyl as instructed Prim Dressing: Santyl  Ointment 1 x Per Day/30 Days ary Discharge Instructions: Apply nickel thick amount to wound bed as instructed Secondary Dressing: Optifoam Non-Adhesive Dressing, 4x4 in 1 x Per Day/30 Days Discharge Instructions: Apply foam donut. Secondary Dressing: Woven Gauze Sponges 2x2 in 1 x Per Day/30 Days Discharge Instructions: Apply over primary dressing as directed. Secured With: Insurance underwriter, Sterile 2x75 (in/in) 1 x Per Day/30 Days Discharge Instructions: Secure with stretch gauze as  directed. Secured With: 32M Medipore H Soft Cloth Surgical T ape, 4 x 10 (in/yd) 1 x Per Day/30 Days Discharge Instructions: Secure with tape as directed. 1. He continues on doxycycline for osteomyelitis noted on x-ray in the third metatarsal head 2. Santyl and silver alginate to the right lateral fifth metatarsal head wound. 3. He has a rim of granulation around the necrotic wound on the right lateral fifth metatarsal head. Hopefully we can stimulate some granulation over the tip of this difficult wound. 4. Severe PAD he does not have an option for revascularization already discussed with vascular Dr. Juanetta Gosling Electronic Signature(s) Signed: 01/12/2023 4:34:18 PM By: [name] MD Signed: 01/12/2023 6:10:25 PM By: Shawn Stall RN, BSN Previous Signature: 01/09/2023 4:50:34 PM Version By: [name] MD Entered By: Shawn Stall on 01/12/2023 16:01:42 [name], [name] ([id number]) [id number] Page 9 of 9 -------------------------------------------------------------------------------- SuperBill Details Patient Name: Date of Service: [name] 01/09/2023 Medical Record Number: [id number] Patient Account Number: [id number] Date of Birth/Sex: Treating RN: 12/22/1933 (89 y.o. [name] Primary Care Provider: [name] Other Clinician: Referring Provider: Treating Provider/Extender: [name] in Treatment: 27 Diagnosis Coding ICD-10 Codes Code Description [phone] Non-pressure chronic ulcer of other part of right foot with fat layer exposed L97.514 Non-pressure chronic ulcer of other part of right foot with necrosis of bone E11.621 Type 2 diabetes mellitus with foot ulcer I70.235 Atherosclerosis of native arteries of right leg with ulceration of other part of foot Facility Procedures : CPT4 Code: [id number] Description: 97597 - DEBRIDE WOUND 1ST 20 SQ CM OR < ICD-10 Diagnosis Description L97.514  Non-pressure chronic ulcer of other part of right foot with necrosis of bone E11.621 Type 2 diabetes mellitus with foot ulcer Modifier: Quantity: 1 Physician Procedures : CPT4 Code Description Modifier [id number] - WC PHYS DEBR WO ANESTH 20 SQ CM ICD-10 Diagnosis Description L97.514 Non-pressure chronic ulcer of other part of right foot with necrosis of bone E11.621 Type 2 diabetes mellitus with foot ulcer Quantity: 1 Electronic Signature(s) Signed: 01/09/2023 4:50:34 PM By: [name] MD Entered By: [name] on 01/09/2023 16:17:49

## 2023-01-10 NOTE — Progress Notes (Signed)
RIDEAUX, Carnel ([id number]) [id number] Page 1 of 3 Visit Report for 01/10/2023 Arrival Information Details Patient Name: Date of Service: [name] 01/10/2023 12:00 PM Medical Record Number: [id number] Patient Account Number: [id number] Date of Birth/Sex: Treating RN: 1933-09-23 (89 y.o. [name] Primary Care Vinal Rosengrant: [name] Other Clinician: [name] Referring Azalea Cedar: Treating Shelbee Apgar/Extender: [name] in Treatment: 27 Visit Information History Since Last Visit All ordered tests and consults were completed: Yes Patient Arrived: Ambulatory Added or deleted any medications: No Arrival Time: 12:08 Any new allergies or adverse reactions: No Accompanied By: None Had a fall or experienced change in No Transfer Assistance: None activities of daily living that may affect Patient Identification Verified: Yes risk of falls: Secondary Verification Process Completed: Yes Signs or symptoms of abuse/neglect since last visito No Patient Requires Transmission-Based Precautions: No Hospitalized since last visit: No Patient Has Alerts: Yes Implantable device outside of the clinic excluding No Patient Alerts: Patient on Blood Thinner cellular tissue based products placed in the center since last visit: Pain Present Now: Yes Electronic Signature(s) Signed: 01/10/2023 3:12:50 PM By: [name] Entered By: [name] on 01/10/2023 15:12:50 -------------------------------------------------------------------------------- Encounter Discharge Information Details Patient Name: Date of Service: [name] 01/10/2023 12:00 PM Medical Record Number: [id number] Patient Account Number: [id number] Date of Birth/Sex: Treating RN: 1933/11/24 (89 y.o. [name] Primary Care Katelyn Kohlmeyer: [name] Other Clinician: [name] Referring Omega Slager: Treating Jep Dyas/Extender:  [name] in Treatment: 27 Encounter Discharge Information Items Discharge Condition: Stable Ambulatory Status: Ambulatory Discharge Destination: Home Transportation: Private Auto Accompanied By: None Schedule Follow-up Appointment: Yes Clinical Summary of Care: Electronic Signature(s) Signed: 01/10/2023 3:57:47 PM By: [name] Entered By: [name] on 01/10/2023 15:57:47 Schumpert, Sarath ([id number]) [id number] Page 2 of 3 -------------------------------------------------------------------------------- Pain Assessment Details Patient Name: Date of Service: [name] 01/10/2023 12:00 PM Medical Record Number: [id number] Patient Account Number: [id number] Date of Birth/Sex: Treating RN: 1933-10-28 (89 y.o. [name] Primary Care Lyn Joens: [name] Other Clinician: [name] Referring Edynn Gillock: Treating Aniayah Alaniz/Extender: [name] in Treatment: 27 Active Problems Location of Pain Severity and Description of Pain Patient Has Paino Yes Site Locations Pain Location: Pain in Ulcers With Dressing Change: No Duration of the Pain. Constant / Intermittento Constant Rate the pain. Current Pain Level: 8 Worst Pain Level: 10 Least Pain Level: 0 Tolerable Pain Level: 3 Pain Management and Medication Current Pain Management: Electronic Signature(s) Signed: 01/10/2023 3:52:18 PM By: [name] Signed: 01/10/2023 5:09:47 PM By: Zenaida Deed RN, BSN Entered By: [name] on 01/10/2023 15:52:18 -------------------------------------------------------------------------------- Vitals Details Patient Name: Date of Service: [name] 01/10/2023 12:00 PM Medical Record Number: [id number] Patient Account Number: [id number] Date of Birth/Sex: Treating RN: 14-Oct-1933 (89 y.o. [name] Primary Care Ezeriah Luty: [name] Other Clinician: [name] Referring Beatrice Ziehm: Treating Jonah Gingras/Extender: [name] in Treatment: 27 Vital Signs Time Taken: 12:29 Temperature (F): 97.3 Height (in): 71 Pulse (bpm): 91 Weight (lbs): 170 Respiratory Rate (breaths/min): 18 Body Mass Index (BMI): 23.7 Blood Pressure (mmHg): 115/73 Capillary Blood Glucose (mg/dl): 416 Reference Range: 80 - 120 mg / dl Electronic Signature(s) Crabbe, Lucas ([id number]) [id number] Page 3 of 3 Signed: 01/10/2023 3:50:41 PM By: [name] Entered By: [name] on 01/10/2023 15:50:41

## 2023-01-11 ENCOUNTER — Encounter (HOSPITAL_BASED_OUTPATIENT_CLINIC_OR_DEPARTMENT_OTHER): Payer: PPO | Admitting: Internal Medicine

## 2023-01-11 DIAGNOSIS — E11621 Type 2 diabetes mellitus with foot ulcer: Secondary | ICD-10-CM | POA: Diagnosis not present

## 2023-01-11 LAB — GLUCOSE, CAPILLARY: Glucose-Capillary: 146 mg/dL — ABNORMAL HIGH (ref 70–99)

## 2023-01-11 NOTE — Progress Notes (Addendum)
[name], [name] [id number] Page 1 of 3 Visit Report for 01/11/2023 Arrival Information Details Patient Name: Date of Service: [name] 01/11/2023 12:00 PM Medical Record Number: [id number] Patient Account Number: [id number] Date of Birth/Sex: Treating RN: 1934-01-20 (89 y.o. [name] Primary Care [name]: [name] Other Clinician: Karl [name] Referring [name]: Treating [name]/Extender: [name] in Treatment: 27 Visit Information History Since Last Visit All ordered tests and consults were completed: Yes Patient Arrived: Ambulatory Added or deleted any medications: No Arrival Time: 12:21 Any new allergies or adverse reactions: No Accompanied By: daughter Had a fall or experienced change in No Transfer Assistance: None activities of daily living that may affect Patient Identification Verified: Yes risk of falls: Secondary Verification Process Completed: Yes Signs or symptoms of abuse/neglect since last visito No Patient Requires Transmission-Based Precautions: No Hospitalized since last visit: No Patient Has Alerts: Yes Implantable device outside of the clinic excluding No Patient Alerts: Patient on Blood Thinner cellular tissue based products placed in the center since last visit: Pain Present Now: Yes Electronic Signature(s) Signed: 01/11/2023 2:39:41 PM By: [name] EMT Entered By: [name] on 01/11/2023 14:39:41 -------------------------------------------------------------------------------- Encounter Discharge Information Details Patient Name: Date of Service: [name] 01/11/2023 12:00 PM Medical Record Number: [id number] Patient Account Number: [id number] Date of Birth/Sex: Treating RN: 03/21/1933 (89 y.o. [name] Primary Care [name]: [name] Other Clinician: Karl [name] Referring [name]: Treating  [name]/Extender: [name] in Treatment: 27 Encounter Discharge Information Items Discharge Condition: Stable Ambulatory Status: Ambulatory Discharge Destination: Home Transportation: Private Auto Accompanied By: daughter Schedule Follow-up Appointment: Yes Clinical Summary of Care: Electronic Signature(s) Signed: 01/11/2023 3:05:30 PM By: [name] EMT Entered By: [name] on 01/11/2023 15:05:30 [name], [name] ([id number]) [id number] Page 2 of 3 -------------------------------------------------------------------------------- Pain Assessment Details Patient Name: Date of Service: [name] 01/11/2023 12:00 PM Medical Record Number: [id number] Patient Account Number: [id number] Date of Birth/Sex: Treating RN: [birth year] (89 y.o. [name] Primary Care [name]: [name] Other Clinician: Karl [name] Referring [name]: Treating [name]/Extender: [name] in Treatment: 27 Active Problems Location of Pain Severity and Description of Pain Patient Has Paino Yes Site Locations Pain Location: Pain in Ulcers With Dressing Change: No Duration of the Pain. Constant / Intermittento Constant Rate the pain. Current Pain Level: 6 Worst Pain Level: 10 Least Pain Level: 0 Tolerable Pain Level: 3 Pain Management and Medication Current Pain Management: Electronic Signature(s) Signed: 01/11/2023 2:40:46 PM By: [name] EMT Signed: 01/12/2023 6:28:11 PM By: [name] Schwalbe RN Entered By: [name] on 01/11/2023 14:40:46 -------------------------------------------------------------------------------- Vitals Details Patient Name: Date of Service: [name] 01/11/2023 12:00 PM Medical Record Number: [id number] Patient Account Number: [id number] Date of Birth/Sex: Treating RN: Nov 03, 1933 (89 y.o. [name] Primary Care Shawntrice Salle:  [name] Other Clinician: Karl [name] Referring Rasul Decola: Treating [name]/Extender: [name] in Treatment: 27 Vital Signs Time Taken: 12:23 Temperature (F): 97.3 Height (in): 71 Pulse (bpm): 100 Weight (lbs): 170 Respiratory Rate (breaths/min): 18 Body Mass Index (BMI): 23.7 Blood Pressure (mmHg): 143/93 Reference Range: 80 - 120 mg / dl Electronic Signature(s) Signed: 01/11/2023 2:41:36 PM By: Fay Records, [name] 2:41:36 PM By: [name] EMT Signed: 01/11/2023 ([id number]) [id number] Page 3 of 3 Entered By: [name] on 01/11/2023 14:41:36

## 2023-01-11 NOTE — Progress Notes (Signed)
HODAPP, Bode ([id number]) [id number] Page 1 of 2 Visit Report for 01/10/2023 Problem List Details Patient Name: Date of Service: [name] [name] 01/10/2023 12:00 PM Medical Record Number: [id number] Patient Account Number: [id number] Date of Birth/Sex: Treating RN: 1933-08-16 (89 y.o. 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 [name] Primary Care Provider: [name] Other Clinician: [name] Referring Provider: Treating Provider/Extender: [name] in Treatment: 27 Diagnosis Coding ICD-10 Codes Code Description OMARRI, EICH ([id number]) [id number] Page 2 of 2 [phone] Non-pressure chronic ulcer of other part of right foot with fat layer exposed L97.514  Non-pressure chronic ulcer of other part of right foot with necrosis of bone E11.621 Type 2 diabetes mellitus with foot ulcer I70.235 Atherosclerosis of native arteries of right leg with ulceration of other part of foot Facility Procedures : CPT4 Code Description: 88416606 G0277-(Facility Use Only) HBOT full body chamber, , ICD-10 Diagnosis Description E11.621 Type 2 diabetes mellitus with foot ulcer L97.512 Non-pressure chronic ulcer of other part of right foot with f L97.514  Non-pressure chronic ulcer of other part of right foot with n I70.235 Atherosclerosis of native arteries of right leg with ulcerati Modifier: at layer exposed ecrosis of bone on of other part Quantity: 4 of foot Physician Procedures : CPT4 Code Description Modifier [id number] [phone] - WC PHYS HYPERBARIC OXYGEN THERAPY ICD-10 Diagnosis Description E11.621 Type 2 diabetes mellitus with foot ulcer L97.512 Non-pressure chronic ulcer of other part of right foot with fat layer exposed L97.514  Non-pressure chronic ulcer of other part of right foot with necrosis of bone I70.235 Atherosclerosis of native arteries of right leg with ulceration of other part Quantity: 1 of foot Electronic Signature(s) Signed: 01/10/2023 3:56:59 PM By: [name] Signed: 01/10/2023 4:32:14 PM By: [name] MD Entered By: [name] on 01/10/2023 15:56:58

## 2023-01-11 NOTE — Progress Notes (Signed)
RENNAKER, Maki ([id number]) [id number] Page 1 of 2 Visit Report for 01/11/2023 Problem List Details Patient Name: Date of Service: [name] 01/11/2023 12:00 PM Medical Record Number: [id number] Patient Account Number: [id number] Date of Birth/Sex: Treating RN: [birth year] (89 y.o. 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 E11.621 Type 2 diabetes mellitus with foot ulcer 07/04/2022 No Yes I70.235 Atherosclerosis of native arteries of right leg with ulceration of 07/04/2022 No Yes other part of foot Inactive Problems Resolved Problems Electronic Signature(s) Signed: 01/11/2023 3:00:35 PM By: [name] Signed: 01/11/2023 4:54:50 PM By: [name] MD Entered By: [name] on 01/11/2023 15:00:35 -------------------------------------------------------------------------------- SuperBill Details Patient Name: Date of Service: [name] 01/11/2023 Medical Record Number: [id number] Patient Account Number: [id number] Date of Birth/Sex: Treating RN: 1933/05/06 (89 y.o. [name] Primary Care Provider: [name] Other Clinician: [name] Referring Provider: Treating Provider/Extender: [name] in Treatment: 27 Diagnosis Coding ICD-10 Codes Code Description [name], [name] ([id number]) [id number] Page 2 of 2 [phone] Non-pressure chronic ulcer of other part of right foot with fat layer exposed L97.514  Non-pressure chronic ulcer of other part of right foot with necrosis of bone E11.621 Type 2 diabetes mellitus with foot ulcer I70.235 Atherosclerosis of native arteries of right leg with ulceration of other part of foot Facility Procedures : CPT4 Code Description: [id number] G0277-(Facility Use Only) HBOT full body chamber, , ICD-10 Diagnosis Description E11.621 Type 2 diabetes mellitus with foot ulcer L97.514 Non-pressure chronic ulcer of other part of right foot with n L97.512  Non-pressure chronic ulcer of other part of right foot with f I70.235 Atherosclerosis of native arteries of right leg with ulcerati Modifier: ecrosis of bone at layer exposed on of other part Quantity: 4 of foot Physician Procedures : CPT4 Code Description Modifier [id number] - WC PHYS HYPERBARIC OXYGEN THERAPY ICD-10 Diagnosis Description E11.621 Type 2 diabetes mellitus with foot ulcer 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.512  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.235 Atherosclerosis of native arteries of right leg with ulceration of other part Quantity: 1 of foot Electronic Signature(s) Signed: 01/11/2023 2:59:58 PM By: [name] Signed: 01/11/2023 4:54:50 PM By: [name] MD Entered By: [name] on 01/11/2023 14:59:58

## 2023-01-11 NOTE — Progress Notes (Addendum)
[name], [name] ([id number]) [id number] Page 1 of 3 Visit Report for 01/11/2023 HBO Details Patient Name: Date of Service: [name] 01/11/2023 12:00 PM Medical Record Number: [id number] Patient Account Number: [id number] Date of Birth/Sex: Treating RN: 1933/12/29 (89 y.o. [name] Primary Care [name]: [name] Other Clinician: Karl [name] Referring [name]: Treating [name]: [name]: 27 HBO [name] Course Details [name] Course Number: 1 Ordering [name]: [name] T Treatments Ordered: otal 40 HBO [name] Start Date: 01/09/2023 HBO Indication: Diabetic Ulcer(s) of the Lower Extremity Standard/Conservative Wound Care tried and failed greater than or equal to 30 days Wound #3 Right Amputation Site - T oe HBO [name] Details [name] Number: 3 Patient Type: Outpatient Chamber Type: Monoplace Chamber Serial #: [id number] [name] Protocol: 2.0 ATA with 90 minutes oxygen, and no air breaks [name] Details Compression Rate Down: 1.0 psi / minute De-Compression Rate Up: Air breaks and breathing Decompress Decompress Compress Tx Pressure Begins Reached periods Begins Ends (leave unused spaces blank) Chamber Pressure (ATA 1 2 ------2 1 ) Clock Time (24 hr) 12:47 13:02 - - - - - - 14:32 14:43 [name] Length: 116 (minutes) [name] Segments: 4 Vital Signs Capillary Blood Glucose Reference Range: 80 - 120 mg / dl HBO Diabetic Blood Glucose Intervention Range: <131 mg/dl or >324 mg/dl Time Vitals Blood Respiratory Capillary Blood Glucose Pulse Action Type: Pulse: Temperature: Taken: Pressure: Rate: Glucose (mg/dl): Meter #: Oximetry (%) Taken: Pre 12:23 143/93 100 18 97.3 Post 14:46 139/84 98 18 98.6 [name] Response [name] Toleration: Well [name] Completion Status: [name] Completed without Adverse Event [name] Notes Spoke  with Dr. Leanord [name] about the patient heart rate being high. Dr. Leanord [name] stated that we could go head with the [name] and. after the [name] and that he would come see the patient. After [name] Dr. Leanord [name] at chamber side seeing the patient. Additional Procedure Documentation Tissue Sevierity: Necrosis of bone [name] Notes Patient is tolerating hyperbarics well. I saw him post [name] for tachycardia. Looking back on things this is sinus tachycardia. I may have to do with pain. He seems more controlled in terms of the tachycardia after [name] than before. He does not complain of chest pain or shortness of breath. Other than the tachycardia his cardiac exam was normal at which time his pulse was 100 O2 sats 98% blood pressure stable respiratory exam was clear Physician HBO AttestationPhares [name], [name] ([id number]) [id number] Page 2 of 3 I certify that I supervised this HBO [name] in accordance with Medicare guidelines. A trained emergency response team is readily available per Yes hospital policies and procedures. Continue HBOT as ordered. Yes Electronic Signature(s) Signed: 01/11/2023 4:54:50 PM By: [name] Najjar MD Previous Signature: 01/11/2023 2:59:28 PM Version By: [name] EMT Previous Signature: 01/11/2023 2:43:54 PM Version By: [name] EMT Entered By: [name] on 01/11/2023 16:54:05 -------------------------------------------------------------------------------- HBO Safety Checklist Details Patient Name: Date of Service: [name] 01/11/2023 12:00 PM Medical Record Number: [id number] Patient Account Number: [id number] Date of Birth/Sex: Treating RN: [birth year] (89 y.o. [name] Primary Care [name]: [name] Other Clinician: Karl [name] Referring [name]: Treating [name]/Extender: [name]: 27 HBO Safety Checklist Items Safety Checklist Consent  Form Signed Patient voided / foley secured and emptied When did you last eato 1030 Last dose of injectable or oral agent NA Ostomy pouch emptied and vented if applicable NA All implantable devices assessed, documented and approved NA Intravenous access site secured and  place NA Valuables secured Linens and cotton and cotton/polyester blend (less than 51% polyester) Personal oil-based products / skin lotions / body lotions removed Wigs or hairpieces removed NA Smoking or tobacco materials removed Books / newspapers / magazines / loose paper removed Cologne, aftershave, perfume and deodorant removed Jewelry removed (may wrap wedding band) Make-up removed NA Hair care products removed Battery operated devices (external) removed Heating patches and chemical warmers removed Titanium eyewear removed NA Nail polish cured greater than 10 hours NA Casting material cured greater than 10 hours NA Hearing aids removed removed Loose dentures or partials removed removed by patient Prosthetics have been removed NA Patient demonstrates correct use of air break device (if applicable) Patient concerns have been addressed Patient grounding bracelet on and cord attached to chamber Specifics for Inpatients (complete in addition to above) Medication sheet sent with patient NA Intravenous medications needed or due during therapy sent with patient NA Drainage tubes (e.g. nasogastric tube or chest tube secured and vented) NA Endotracheal or Tracheotomy tube secured NA Cuff deflated of air and inflated with saline NA Airway suctioned NA Notes The safety checklist was done before the [name] was started. [name], [name] ([id number]) [id number] Page 3 of 3 Electronic Signature(s) Signed: 01/11/2023 2:43:02 PM By: [name] EMT Entered By: [name] on 01/11/2023 14:43:02

## 2023-01-12 ENCOUNTER — Encounter (HOSPITAL_BASED_OUTPATIENT_CLINIC_OR_DEPARTMENT_OTHER): Payer: PPO | Admitting: Internal Medicine

## 2023-01-12 DIAGNOSIS — E11621 Type 2 diabetes mellitus with foot ulcer: Secondary | ICD-10-CM | POA: Diagnosis not present

## 2023-01-12 NOTE — Progress Notes (Addendum)
HODAPP, Kalem ([id number]) [id number] Page 1 of 2 Visit Report for 01/12/2023 HBO Details Patient Name: Date of Service: [name] 01/12/2023 12:00 PM Medical Record Number: [id number] Patient Account Number: [id number] Date of Birth/Sex: Treating RN: 01-24-1934 (89 y.o. [name] Primary Care Shauntel Prest: [name] Other Clinician: [name] Referring Akiah Bauch: Treating Malachai Schalk/Extender: [name] in Treatment: 27 HBO Treatment Course Details Treatment Course Number: 1 Ordering Constant [place]: [name] T Treatments Ordered: otal 40 HBO Treatment Start Date: 01/09/2023 HBO Indication: Diabetic Ulcer(s) of the Lower Extremity Standard/Conservative Wound Care tried and failed greater than or equal to 30 days Wound #3 Right Amputation Site - T oe HBO Treatment Details Treatment Number: 4 Patient Type: Outpatient Chamber Type: Monoplace Chamber Serial #: [id number] Treatment Protocol: 2.0 ATA with 90 minutes oxygen, and no air breaks Treatment Details Compression Rate Down: 1.0 psi / minute De-Compression Rate Up: Air breaks and breathing Decompress Decompress Compress Tx Pressure Begins Reached periods Begins Ends (leave unused spaces blank) Chamber Pressure (ATA 1 2 ------2 1 ) Clock Time (24 hr) 12:37 12:52 - - - - - - 14:22 14:36 Treatment Length: 119 (minutes) Treatment Segments: 4 Vital Signs Capillary Blood Glucose Reference Range: 80 - 120 mg / dl HBO Diabetic Blood Glucose Intervention Range: <131 mg/dl or >086 mg/dl Time Vitals Blood Respiratory Capillary Blood Glucose Pulse Action Type: Pulse: Temperature: Taken: Pressure: Rate: Glucose (mg/dl): Meter #: Oximetry (%) Taken: Pre 12:21 151/87 112 18 98.4 Post 14:38 144/99 91 18 97.2 Treatment Response Treatment Toleration: Well Treatment Completion Status: Treatment Completed without Adverse Event Additional  Procedure Documentation Tissue Sevierity: Necrosis of bone [name] Notes No concerns with treatment given. I have looked over his epic notes. I do not see any reference to cardiology. I do see fairly persistent tachycardia there is a note to that effect from July of this year at another physician's office. Physician HBO Attestation: I certify that I supervised this HBO treatment in accordance with Medicare guidelines. A trained emergency response team is readily available per Yes hospital policies and procedures. Continue HBOT as ordered. Miroslav Gin, Larry ([id number]) [id number] Page 2 of 2 Electronic Signature(s) Signed: 01/12/2023 4:34:18 PM By: [name] MD Previous Signature: 01/12/2023 2:45:27 PM Version By: [name] Previous Signature: 01/12/2023 1:48:03 PM Version By: [name] Entered By: [name] on 01/12/2023 13:26:03 -------------------------------------------------------------------------------- HBO Safety Checklist Details Patient Name: Date of Service: [name] 01/12/2023 12:00 PM Medical Record Number: [id number] Patient Account Number: [id number] Date of Birth/Sex: Treating RN: [birth year] (89 y.o. [name] Primary Care Kynsli Haapala: [name] Other Clinician: [name] Referring Graylee Arutyunyan: Treating Naijah Lacek/Extender: [name] in Treatment: 27 HBO Safety Checklist Items Safety Checklist Consent Form Signed Patient voided / foley secured and emptied When did you last eato 1000 Last dose of injectable or oral agent NA Ostomy pouch emptied and vented if applicable NA All implantable devices assessed, documented and approved NA Intravenous access site secured and place NA Valuables secured Linens and cotton and cotton/polyester blend (less than 51% polyester) Personal oil-based products / skin lotions / body lotions removed Wigs or hairpieces  removed NA Smoking or tobacco materials removed Books / newspapers / magazines / loose paper removed Cologne, aftershave, perfume and deodorant removed Jewelry removed (may wrap wedding band) Make-up removed NA Hair care products removed Battery operated devices (external) removed Heating patches and chemical warmers removed Titanium eyewear removed NA Nail polish cured greater  than 10 hours NA Casting material cured greater than 10 hours NA Hearing aids removed removed Loose dentures or partials removed removed by patient Prosthetics have been removed NA Patient demonstrates correct use of air break device (if applicable) Patient concerns have been addressed Patient grounding bracelet on and cord attached to chamber Specifics for Inpatients (complete in addition to above) Medication sheet sent with patient NA Intravenous medications needed or due during therapy sent with patient NA Drainage tubes (e.g. nasogastric tube or chest tube secured and vented) NA Endotracheal or Tracheotomy tube secured NA Cuff deflated of air and inflated with saline NA Airway suctioned NA Notes The safety checklist was done before the treatment was started. Electronic Signature(s) Signed: 01/12/2023 1:47:16 PM By: [name] Entered By: [name] on 01/12/2023 10:47:15

## 2023-01-12 NOTE — Progress Notes (Signed)
CONLY, Landon ([id number]) [id number] Page 1 of 2 Visit Report for 01/12/2023 Arrival Information Details Patient Name: Date of Service: [name] 01/12/2023 12:00 PM Medical Record Number: [id number] Patient Account Number: [id number] Date of Birth/Sex: Treating RN: 07-29-1933 (89 y.o. [name] Primary Care Minola Guin: [name] Other Clinician: [name] Referring Beaumont Austad: Treating Jamear Carbonneau/Extender: [name] in Treatment: 27 Visit Information History Since Last Visit All ordered tests and consults were completed: Yes Patient Arrived: Ambulatory Added or deleted any medications: No Arrival Time: 12:06 Any new allergies or adverse reactions: No Accompanied By: None Had a fall or experienced change in No Transfer Assistance: None activities of daily living that may affect Patient Identification Verified: Yes risk of falls: Secondary Verification Process Completed: Yes Signs or symptoms of abuse/neglect since last visito No Patient Requires Transmission-Based Precautions: No Hospitalized since last visit: No Patient Has Alerts: Yes Implantable device outside of the clinic excluding No Patient Alerts: Patient on Blood Thinner cellular tissue based products placed in the center since last visit: Pain Present Now: No Electronic Signature(s) Signed: 01/12/2023 1:45:34 PM By: [name] Entered By: [name] on 01/12/2023 10:45:33 -------------------------------------------------------------------------------- Encounter Discharge Information Details Patient Name: Date of Service: [name] 01/12/2023 12:00 PM Medical Record Number: [id number] Patient Account Number: [id number] Date of Birth/Sex: Treating RN: Mar 25, 1933 (89 y.o. [name] Primary Care Dessie Tatem: [name] Other Clinician: [name] Referring Yoshika Vensel: Treating  Doloros Kwolek/Extender: [name] in Treatment: 27 Encounter Discharge Information Items Discharge Condition: Stable Ambulatory Status: Ambulatory Discharge Destination: Home Transportation: Private Auto Accompanied By: None Schedule Follow-up Appointment: Yes Clinical Summary of Care: Electronic Signature(s) Signed: 01/12/2023 2:49:40 PM By: [name] Entered By: [name] on 01/12/2023 11:49:40 Gatling, Zethan ([id number]) [id number] Page 2 of 2 -------------------------------------------------------------------------------- Vitals Details Patient Name: Date of Service: [name] 01/12/2023 12:00 PM Medical Record Number: [id number] Patient Account Number: [id number] Date of Birth/Sex: Treating RN: [birth year] (89 y.o. [name] Primary Care Morgyn Marut: [name] Other Clinician: [name] Referring Pachia Strum: Treating Shadrick Senne/Extender: [name] in Treatment: 27 Vital Signs Time Taken: 12:21 Temperature (F): 98.4 Height (in): 71 Pulse (bpm): 112 Weight (lbs): 170 Respiratory Rate (breaths/min): 18 Body Mass Index (BMI): 23.7 Blood Pressure (mmHg): 151/87 Reference Range: 80 - 120 mg / dl Electronic Signature(s) Signed: 01/12/2023 1:46:00 PM By: [name] Entered By: [name] on 01/12/2023 10:46:00

## 2023-01-12 NOTE — Progress Notes (Signed)
LAZCANO, Ewing ([id number]) [id number] Page 1 of 2 Visit Report for 01/12/2023 Problem List Details Patient Name: Date of Service: [name] 01/12/2023 12:00 PM Medical Record Number: [id number] Patient Account Number: [id number] Date of Birth/Sex: Treating RN: Aug 17, 1933 (89 y.o. 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 E11.621 Type 2 diabetes mellitus with foot ulcer 07/04/2022 No Yes I70.235 Atherosclerosis of native arteries of right leg with ulceration of 07/04/2022 No Yes other part of foot Inactive Problems Resolved Problems Electronic Signature(s) Signed: 01/12/2023 2:46:37 PM By: [name] Signed: 01/12/2023 4:34:18 PM By: [name] MD Entered By: [name] on 01/12/2023 11:46:37 -------------------------------------------------------------------------------- SuperBill Details Patient Name: Date of Service: [name] 01/12/2023 Medical Record Number: [id number] Patient Account Number: [id number] Date of Birth/Sex: Treating RN: 07-Apr-1933 (89 y.o. [name] Primary Care Provider: [name] Other Clinician: [name] Referring Provider: Treating Provider/Extender: [name] in Treatment: 27 Diagnosis Coding ICD-10 Codes Code Description [name], [name] ([id number]) [id number] Page 2 of 2 [phone] Non-pressure chronic ulcer of other part of right foot with fat layer exposed L97.514  Non-pressure chronic ulcer of other part of right foot with necrosis of bone E11.621 Type 2 diabetes mellitus with foot ulcer I70.235 Atherosclerosis of native arteries of right leg with ulceration of other part of foot Facility Procedures : CPT4 Code Description: [id number] G0277-(Facility Use Only) HBOT full body chamber, , ICD-10 Diagnosis Description E11.621 Type 2 diabetes mellitus with foot ulcer L97.514 Non-pressure chronic ulcer of other part of right foot with n L97.512  Non-pressure chronic ulcer of other part of right foot with f I70.235 Atherosclerosis of native arteries of right leg with ulcerati Modifier: ecrosis of bone at layer exposed on of other part Quantity: 4 of foot Physician Procedures : CPT4 Code Description Modifier [id number] - WC PHYS HYPERBARIC OXYGEN THERAPY ICD-10 Diagnosis Description E11.621 Type 2 diabetes mellitus with foot ulcer 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.512  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.235 Atherosclerosis of native arteries of right leg with ulceration of other part Quantity: 1 of foot Electronic Signature(s) Signed: 01/12/2023 2:46:01 PM By: [name] Signed: 01/12/2023 4:34:18 PM By: [name] MD Entered By: [name] on 01/12/2023 11:46:01

## 2023-01-13 ENCOUNTER — Encounter (HOSPITAL_BASED_OUTPATIENT_CLINIC_OR_DEPARTMENT_OTHER): Payer: PPO | Admitting: General Surgery

## 2023-01-13 DIAGNOSIS — E11621 Type 2 diabetes mellitus with foot ulcer: Secondary | ICD-10-CM | POA: Diagnosis not present

## 2023-01-13 NOTE — Progress Notes (Signed)
[name], [name] ([id number]) [id number] Page 1 of 2 Visit Report for 01/13/2023 HBO Details Patient Name: Date of Service: [name] [name] 01/13/2023 9:30 A M Medical Record Number: [id number] Patient Account Number: [id number] Date of Birth/Sex: Treating RN: 1934-01-26 (89 y.o. [name] Primary Care Yoneko Talerico: [name] Other Clinician: [name] Referring Lum Stillinger: Treating Michai Dieppa/Extender: Tonye Pearson in Treatment: 27 HBO Treatment Course Details Treatment Course Number: 1 Ordering Nyshaun Standage: [name] T Treatments Ordered: otal 40 HBO Treatment Start Date: 01/09/2023 HBO Indication: Diabetic Ulcer(s) of the Lower Extremity Standard/Conservative Wound Care tried and failed greater than or equal to 30 days Wound #3 Right Amputation Site - T oe HBO Treatment Details Treatment Number: 5 Patient Type: Outpatient Chamber Type: Monoplace Chamber Serial #: [id number] Treatment Protocol: 2.0 ATA with 90 minutes oxygen, and no air breaks Treatment Details Compression Rate Down: 2.0 psi / minute De-Compression Rate Up: 2.0 psi / minute Air breaks and breathing Decompress Decompress Compress Tx Pressure Begins Reached periods Begins Ends (leave unused spaces blank) Chamber Pressure (ATA 1 2 ------2 1 ) Clock Time (24 hr) 10:37 10:46 - - - - - - 12:14 12:24 Treatment Length: 107 (minutes) Treatment Segments: 4 Vital Signs Capillary Blood Glucose Reference Range: 80 - 120 mg / dl HBO Diabetic Blood Glucose Intervention Range: <131 mg/dl or >347 mg/dl Time Vitals Blood Respiratory Capillary Blood Glucose Pulse Action Type: Pulse: Temperature: Taken: Pressure: Rate: Glucose (mg/dl): Meter #: Oximetry (%) Taken: Pre 09:37 141/84 96 18 97.3 Post 12:27 153/90 80 18 97.5 Treatment Response Treatment Toleration: Well Treatment Completion Status: Treatment Completed without Adverse  Event Additional Procedure Documentation Tissue Sevierity: Necrosis of bone Physician HBO Attestation: I certify that I supervised this HBO treatment in accordance with Medicare guidelines. A trained emergency response team is readily available per Yes hospital policies and procedures. Continue HBOT as ordered. Yes Electronic Signature(s) Signed: 01/16/2023 7:40:33 AM By: Duanne Guess MD FACS Previous Signature: 01/13/2023 12:30:59 PM Version By: [name] Entered By: Duanne Guess on 01/16/2023 04:40:33 Majano, Shuan ([id number]) [id number] Page 2 of 2 -------------------------------------------------------------------------------- HBO Safety Checklist Details Patient Name: Date of Service: [name] [name] 01/13/2023 9:30 A M Medical Record Number: [id number] Patient Account Number: [id number] Date of Birth/Sex: Treating RN: Apr 07, 1933 (89 y.o. [name] Primary Care Domnick Chervenak: [name] Other Clinician: [name] Referring Matas Burrows: Treating Shawnee Higham/Extender: Tonye Pearson in Treatment: 27 HBO Safety Checklist Items Safety Checklist Consent Form Signed Patient voided / foley secured and emptied When did you last eato 0800 Last dose of injectable or oral agent NA Ostomy pouch emptied and vented if applicable NA All implantable devices assessed, documented and approved NA Intravenous access site secured and place NA Valuables secured Linens and cotton and cotton/polyester blend (less than 51% polyester) Personal oil-based products / skin lotions / body lotions removed Wigs or hairpieces removed NA Smoking or tobacco materials removed Books / newspapers / magazines / loose paper removed Cologne, aftershave, perfume and deodorant removed Jewelry removed (may wrap wedding band) Make-up removed NA Hair care products removed Battery operated devices (external) removed Heating  patches and chemical warmers removed Titanium eyewear removed NA Nail polish cured greater than 10 hours NA Casting material cured greater than 10 hours NA Hearing aids removed removed Loose dentures or partials removed removed by patient Prosthetics have been removed NA Patient demonstrates correct use of air break device (if applicable) Patient concerns have been addressed Patient grounding bracelet  on and cord attached to chamber Specifics for Inpatients (complete in addition to above) Medication sheet sent with patient NA Intravenous medications needed or due during therapy sent with patient NA Drainage tubes (e.g. nasogastric tube or chest tube secured and vented) NA Endotracheal or Tracheotomy tube secured NA Cuff deflated of air and inflated with saline NA Airway suctioned NA Notes The safety checklist was done before the treatment was started. Electronic Signature(s) Signed: 01/13/2023 11:14:44 AM By: [name] Entered By: [name] on 01/13/2023 08:14:44

## 2023-01-13 NOTE — Progress Notes (Addendum)
WARLEY, Bria ([id number]) [id number] Page 1 of 3 Visit Report for 01/13/2023 Arrival Information Details Patient Name: Date of Service: [name] 01/13/2023 9:30 A M Medical Record Number: [id number] Patient Account Number: [id number] Date of Birth/Sex: Treating RN: 01/18/1934 (89 y.o. [name] Primary Care Tashianna Broome: [name] Other Clinician: [name] Referring Jayce Kainz: Treating Daemyn Gariepy/Extender: Tonye Pearson in Treatment: 27 Visit Information History Since Last Visit All ordered tests and consults were completed: Yes Patient Arrived: Ambulatory Added or deleted any medications: No Arrival Time: 09:12 Any new allergies or adverse reactions: No Accompanied By: None Had a fall or experienced change in No Transfer Assistance: None activities of daily living that may affect Patient Identification Verified: Yes risk of falls: Secondary Verification Process Completed: Yes Signs or symptoms of abuse/neglect since last visito No Patient Requires Transmission-Based Precautions: No Hospitalized since last visit: No Patient Has Alerts: Yes Implantable device outside of the clinic excluding No Patient Alerts: Patient on Blood Thinner cellular tissue based products placed in the center since last visit: Pain Present Now: Yes Electronic Signature(s) Signed: 01/13/2023 11:12:39 AM By: [name] Entered By: [name] on 01/13/2023 11:12:39 -------------------------------------------------------------------------------- Encounter Discharge Information Details Patient Name: Date of Service: [name] 01/13/2023 9:30 A M Medical Record Number: [id number] Patient Account Number: [id number] Date of Birth/Sex: Treating RN: 01/30/1934 (89 y.o. [name] Primary Care Novia Lansberry: [name] Other Clinician: [name] Referring Rajeev Escue: Treating  Jeiden Daughtridge/Extender: Tonye Pearson in Treatment: 27 Encounter Discharge Information Items Discharge Condition: Stable Ambulatory Status: Ambulatory Discharge Destination: Home Transportation: Private Auto Accompanied By: None Schedule Follow-up Appointment: Yes Clinical Summary of Care: Electronic Signature(s) Signed: 01/13/2023 12:39:31 PM By: [name] Entered By: [name] on 01/13/2023 12:39:31 Kroft, Ian ([id number]) [id number] Page 2 of 3 -------------------------------------------------------------------------------- Pain Assessment Details Patient Name: Date of Service: [name] 01/13/2023 9:30 A M Medical Record Number: [id number] Patient Account Number: [id number] Date of Birth/Sex: Treating RN: 09-Dec-1933 (89 y.o. [name] Primary Care Morse Brueggemann: [name] Other Clinician: [name] Referring Jeronimo Hellberg: Treating Geneviene Tesch/Extender: Tonye Pearson in Treatment: 27 Active Problems Location of Pain Severity and Description of Pain Patient Has Paino Yes Site Locations Pain Location: Pain in Ulcers With Dressing Change: No Duration of the Pain. Constant / Intermittento Constant Rate the pain. Current Pain Level: 3 Worst Pain Level: 10 Least Pain Level: 0 Tolerable Pain Level: 3 Pain Management and Medication Current Pain Management: Electronic Signature(s) Signed: 01/13/2023 11:13:23 AM By: [name] Signed: 01/13/2023 1:08:45 PM By: Zenaida Deed RN, BSN Entered By: [name] on 01/13/2023 11:13:23 -------------------------------------------------------------------------------- Vitals Details Patient Name: Date of Service: [name] 01/13/2023 9:30 A M Medical Record Number: [id number] Patient Account Number: [id number] Date of Birth/Sex: Treating RN: November 20, 1933 (89 y.o. [name] Primary Care  Atari Novick: [name] Other Clinician: [name] Referring Elandra Powell: Treating Otilio Groleau/Extender: Tonye Pearson in Treatment: 27 Vital Signs Time Taken: 09:37 Temperature (F): 97.3 Height (in): 71 Pulse (bpm): 96 Weight (lbs): 170 Respiratory Rate (breaths/min): 18 Body Mass Index (BMI): 23.7 Blood Pressure (mmHg): 141/84 Reference Range: 80 - 120 mg / dl Electronic Signature(s) Signed: 01/13/2023 11:13:46 AM By: Fay Records, [place] 11:13:46 AM By: [name] Signed: 01/13/2023 ([id number]) [id number] Page 3 of 3 Entered By: [name] on 01/13/2023 11:13:46

## 2023-01-16 ENCOUNTER — Encounter (HOSPITAL_BASED_OUTPATIENT_CLINIC_OR_DEPARTMENT_OTHER): Payer: PPO | Admitting: Internal Medicine

## 2023-01-16 DIAGNOSIS — E11621 Type 2 diabetes mellitus with foot ulcer: Secondary | ICD-10-CM | POA: Diagnosis not present

## 2023-01-16 NOTE — Progress Notes (Signed)
SWANTON, [name] ([id number]) [id number] Page 1 of 10 Visit Report for 01/16/2023 Debridement Details Patient Name: Date of Service: [name] 01/16/2023 2:45 PM Medical Record Number: [id number] Patient Account Number: [id number] Date of Birth/Sex: Treating RN: 26-May-1933 (89 y.o. [name] Primary Care Provider: [name] Other Clinician: Referring Provider: Treating Provider/Extender: [name] in Treatment: 28 Debridement Performed for Assessment: Wound #4 Right,Lateral Foot Performed By: Physician Maxwell Caul., MD The following information was scribed by: Shawn Stall The information was scribed for: [name] Debridement Type: Chemical/Enzymatic/Mechanical Agent Used: Santyl Severity of Tissue Pre Debridement: Fat layer exposed Level of Consciousness (Pre-procedure): Awake and Alert Pre-procedure Verification/Time Out No Taken: Percent of Wound Bed Debrided: Bleeding: None Response to Treatment: Procedure was tolerated well Level of Consciousness (Post- Awake and Alert procedure): Post Debridement Measurements of Total Wound Length: (cm) 3 Width: (cm) 2.7 Depth: (cm) 0.1 Volume: (cm) 0.636 Character of Wound/Ulcer Post Debridement: Requires Further Debridement Severity of Tissue Post Debridement: Fat layer exposed Post Procedure Diagnosis Same as Pre-procedure Electronic Signature(s) Signed: 01/16/2023 4:28:03 PM By: Shawn Stall RN, BSN Signed: 01/16/2023 4:37:17 PM By: [name] MD Entered By: Shawn Stall on 01/16/2023 15:29:50 -------------------------------------------------------------------------------- HPI Details Patient Name: Date of Service: [name] 01/16/2023 2:45 PM Medical Record Number: [id number] Patient Account Number: [id number] Date of Birth/Sex: Treating RN: 04/09/1933 (89 y.o. M) Primary Care Provider: [name] Other  Clinician: Referring Provider: Treating Provider/Extender: [name] in Treatment: 28 History of Present Illness HPI Description: 07/04/2022 [name] is an 89 year old male with a past medical history of uncontrolled, insulin dependent type 2 diabetes, CKD stage III, and COPD that presents to the clinic for a right foot wound. He has an extensive history of peripheral arterial disease to the right leg. He has had a right superficial femoral to posterior tibial artery bypass by Dr. Elonda Husky in 2020. He had a right common femoral to plantar posterior tibial artery bypass on 02/03/2022 by Dr. Lenell Antu and at that time he also had a right toe amputation. He states this wound site had healed completely. Prior to this he had amputations to the first and second [name], [name] ([id number]) [id number] Page 2 of 10 digit to the right foot. He presents today with an open wound to the previous right toe amputation site for the past 2 months. He has been following with vein and vascular for this issue. Most recent ABIs were on 06/22/2022 that showed absent waveforms in the right lower extremity. Currently his right lower extremity bypass is occluded and no plan for another anteriorgram and has he likely has no revascularization options. Currently he has been using Betadine to the wound bed. He denies signs of infection. 07/11/2022 patient arrives in clinic today using Hydrofera Blue on the toe amputation site. The patient is revascularized and is not felt to have any other options for revascularization. We are using Hydrofera Blue and gauze. 5/13; patient presents for follow-up. He has been using Hydrofera Blue to the toe amputation site. He has had no issues. Wound is smaller. He has been approved for [facility] and was agreeable to have this placed in office today. 5/20; patient presents for follow-up. We have been using Kerecis to the wound  bed. There is been improvement in healing. Unfortunately patient did not change the outer dressing for a week. There is slight maceration to the periwound. Unclear why he did not do this. 5/30; patient presents  for follow-up. We have been using Kerecis to the wound bed. More maceration to the periwound today. No signs of infection. 6/10; patient presents for follow-up. He has been using Hydrofera Blue to the wound bed. He has no issues or complaints today. Maceration has almost completely resolved. 6/18; patient presents for follow-up. He has been using blast X and collagen to the wound bed without issues. Wound appears smaller today. 6/28; patient presents for follow-up. He has been using blast X and collagen to the wound bed. The wound is smaller. 7/11; patient presents for follow-up. He has been using blast X and collagen to the wound bed. Wound is smaller. He has slough accumulation. 7/25; patient presents for follow-up. He has been using blast X and collagen to the wound bed. Wound is stable. He denies signs of infection. 8/26; patient presents for follow-up. He has been using blast X and collagen to the wound bed. Wound is slightly smaller. He has no issues or complaints. 8/29; patient presents for follow-up. He has been using blast X and Aquacel Ag to the wound bed. He has been using a Velcro wrap at his toes to keep the toes closer together to help with wound healing. He has no issues or complaints today. Wound is smaller. 9/12; patient presents for follow-up. He has been using blast X and Aquacel Ag to the wound bed. Unfortunately he has developed a pressure injury to the lateral aspect of the foot. He is not sure how this started. No open wound there. He denies pain. 9/23; this patient has a wound on the right foot at the amputation site of her third toe. It is right up against the fourth toe making it somewhat difficult to dress. When he was here last time he had a blister/DTI on the  lateral aspect of the fifth MTP 9/30; patient has a wound on the right foot amputation site of his third toe. Probably in the third fourth webspace. I changed him to endoform last week. The wound looks somewhat better The area over the lateral part of the fifth metatarsal head which was blistered with some central deep tissue looking injury was leaking fluid today. The blister was unroofed. I used pickups and scissors to remove this. I do not know that there is anything technically open here although there is certainly some central tissue at risk 10/7; his original wound at the amputation site in his third fourth webspace looks stable to improved. We have been using endoform. The real concern today is the area over the lateral part of the fifth metatarsal head. This initially looked like a blister with central deep tissue injury I unroofed the blister last week there was nothing technically open but the deep tissue injury was concerning. T oday he comes in with a necrotic surface I remove this. The central part of this area which I am assuming was the deep tissue injury is necrotic I removed some of the necrotic tissue with a #15 scalpel and pickups. There is undermining around the wound edge here. 10/14; his original wound at the amputation site of his third fourth toe webspace looks improved we have gradually been making progress here. Using endoform. Unfortunately the area over the lateral part of the right fifth metatarsal head continues to deteriorate. Necrotic surface last week continues this week. The PCR culture I did last week did not manage to make it to the right lab. I have looked at his x-ray the bone underneath does not look that bad  to me however we have not yet had a radiologist interpretation. We have been using Iodoflex this week. 10/21; the original amputation site of his third fourth toe webspace continues to look reasonably healthy and making slow but steady progress we have  been using endoform The area on the right lateral fifth metatarsal head has been deteriorating problem. PCR culture grew MRSA. I have him on doxycycline. There is exposed bone in the wound under necrotic material. I still do not have the x-ray back from October 7 at least in terms of the report. Addendum 12/27/2022; I have looked back on this patient's vascular status. He is status post a right common femoral to posterior tibial artery bypass in 2020. He had a right common femoral to plantar posterior tibial bypass by Dr. Lenell Antu in November 2023. He also had a right third toe amputation at the same time. The wound that we have been dealing with the third toe amputation site was present after the surgery. He was last seen by vascular surgery on 06/21/2022 and was supposed to follow-up with Dr. Lenell Antu in 3 weeks although I do not see that appointment. I have included the major findings in the last right lower extremity angiogram on 01/21/2022 10/28;Marland Kitchen The x-ray report that I ordered several weeks ago is back. Indeed there was nothing under the fifth met head on the right however notable for bone necrosis under the wound on the left third toe. This suggest osteomyelitis. I am going to extend his doxycycline for 3 further weeks. PCR culture on the lateral foot grew MRSA I sent a secure text message to Dr. Juanetta Gosling. His reply was that there was no additional options for revascularization. The patient went to see Dr. Juanetta Gosling and that is the message they came up with. He did offer him an amputation [o Above-knee]. We have been using endoform to the third toe amputation site. Silver alginate to the new wound over the fifth metatarsal head 11/4; patient continues on doxycycline which she is handling well. He started hyperbaric oxygen today and tolerated the first treatment well. We have been using endoform to the third toe amputation site which was the original wound he came to the clinic for. We are using  Santyl and silver alginate to the wound over the fifth metatarsal head which was the wound that opened while he was under observation. Culture grew MRSA and the wound over the fifth metatarsal head 11/11 continuing doxycycline. He is tolerating hyperbarics although I am continually notified about tachycardia in the 100-110 range. Looking back on EKGs shows this is a longstanding problem sinus tachycardia. He also has left anterior fascicular block. He does not describe chest pain The original wound on the third toe amputation site looks a lot better this is contracting however the area on the right lateral fifth metatarsal head only has a rim of height of granulation tissue around what looks to be periosteum [name], [name] ([id number]) [id number] Page 3 of 10 Electronic Signature(s) Signed: 01/16/2023 4:37:17 PM By: [name] MD Entered By: [name] on 01/16/2023 15:57:31 -------------------------------------------------------------------------------- Physical Exam Details Patient Name: Date of Service: [name] 01/16/2023 2:45 PM Medical Record Number: [id number] Patient Account Number: [id number] Date of Birth/Sex: Treating RN: 01/05/1934 (89 y.o. M) Primary Care Provider: [name] Other Clinician: Referring Provider: Treating Provider/Extender: [name] in Treatment: 28 Notes Wound exam; third metatarsal head amputation site is contracted healthy granulation the area on the right lateral fifth metatarsal head  which was an ischemic wound/infection still has open periosteum/tendon slight edge of surrounding granulation which is improving gradually. Electronic Signature(s) Signed: 01/16/2023 4:37:17 PM By: [name] MD Entered By: [name] on 01/16/2023 16:00:14 -------------------------------------------------------------------------------- Physician Orders Details Patient Name:  Date of Service: [name] 01/16/2023 2:45 PM Medical Record Number: [id number] Patient Account Number: [id number] Date of Birth/Sex: Treating RN: 04/05/1933 (89 y.o. [name] Primary Care Provider: [name] Other Clinician: Referring Provider: Treating Provider/Extender: [name] in Treatment: 28 The following information was scribed by: Shawn Stall The information was scribed for: [name] Verbal / Phone Orders: No Diagnosis Coding ICD-10 Coding Code Description L97.512 Non-pressure chronic ulcer of other part of right foot with fat layer exposed L97.514 Non-pressure chronic ulcer of other part of right foot with necrosis of bone E11.621 Type 2 diabetes mellitus with foot ulcer I70.235 Atherosclerosis of native arteries of right leg with ulceration of other part of foot Follow-up Appointments ppointment in 1 week. - Dr. Leanord Hawking 01/23/2023 245pm Return A ppointment in 2 weeks. - ****Dr. Lady Gary to cover for Dr. Leanord Hawking 01/30/2023 ****front office to schedule Return A Other: - x-ray of right foot at any outpatient facility of [facility]. Anesthetic (In clinic) Topical Lidocaine 5% applied to wound bed Bathing/ Shower/ Hygiene May shower with protection but do not get wound dressing(s) wet. Protect dressing(s) with water repellant cover (for example, large plastic bag) or a cast cover and may then take shower. Hyperbaric Oxygen Therapy Wound #3 Right Amputation Site - Toe Evaluate for HBO Therapy Indication: [address] [name], [name] ([id number]) [id number] Page 4 of 10 If appropriate for treatment, begin HBOT per protocol: 2.0 ATA for 90 Minutes without A Breaks ir Total Number of Treatments: - 30 One treatments per day (delivered Monday through Friday unless otherwise specified in Special Instructions below): Finger stick Blood Glucose Pre- and Post- HBOT Treatment. Follow  Hyperbaric Oxygen Glycemia Protocol Give two 4oz orange juices in addition to Glucerna when the glycemic protocol is used. A frin (Oxymetazoline HCL) 0.05% nasal spray - 1 spray in both nostrils daily as needed prior to HBO treatment for difficulty clearing ears Wound Treatment Wound #3 - Amputation Site - Toe Wound Laterality: Right Cleanser: Vashe 5.8 (oz) Every Other Day/15 Days Discharge Instructions: Cleanse the wound with Vashe prior to applying a clean dressing using gauze sponges, not tissue or cotton balls. Topical: Skintegrity Hydrogel 4 (oz) Every Other Day/15 Days Discharge Instructions: Apply hydrogel as directed Prim Dressing: Endoform 2x2 in Every Other Day/15 Days ary Discharge Instructions: Moisten with saline Secondary Dressing: Woven Gauze Sponge, Non-Sterile 4x4 in (Generic) Every Other Day/15 Days Discharge Instructions: Apply over primary dressing as directed. Secured With: Paper Tape, 2x10 (in/yd) Every Other Day/15 Days Discharge Instructions: Secure dressing with tape as directed. Wound #4 - Foot Wound Laterality: Right, Lateral Cleanser: Soap and Water 1 x Per Day/30 Days Discharge Instructions: May shower and wash wound with dial antibacterial soap and water prior to dressing change. Cleanser: Wound Cleanser 1 x Per Day/30 Days Discharge Instructions: Cleanse the wound with wound cleanser prior to applying a clean dressing using gauze sponges, not tissue or cotton balls. Peri-Wound Care: Skin Prep 1 x Per Day/30 Days Discharge Instructions: Use skin prep as directed Prim Dressing: Maxorb Extra Ag+ Alginate Dressing, 2x2 (in/in) 1 x Per Day/30 Days ary Discharge Instructions: Apply over the santyl as instructed Prim Dressing: Santyl Ointment 1 x Per Day/30 Days ary Discharge Instructions:  Apply nickel thick amount to wound bed as instructed Secondary Dressing: Optifoam Non-Adhesive Dressing, 4x4 in 1 x Per Day/30 Days Discharge Instructions: Apply foam  donut. Secondary Dressing: Woven Gauze Sponges 2x2 in 1 x Per Day/30 Days Discharge Instructions: Apply over primary dressing as directed. Secured With: Insurance underwriter, Sterile 2x75 (in/in) 1 x Per Day/30 Days Discharge Instructions: Secure with stretch gauze as directed. Secured With: 22M Medipore H Soft Cloth Surgical T ape, 4 x 10 (in/yd) 1 x Per Day/30 Days Discharge Instructions: Secure with tape as directed. Radiology X-ray, foot right - x-ray of right foot related to nonhealing diabetic foot ulcer looking for infection/ osteomyelitis. CPT code - (ICD10 E11.621 - Type 2 diabetes mellitus with foot ulcer) GLYCEMIA INTERVENTIONS PROTOCOL PRE-HBO GLYCEMIA INTERVENTIONS ACTION INTERVENTION Obtain pre-HBO capillary blood glucose (ensure 1 physician order is in chart). A. Notify HBO physician and await physician orders. 2 If result is 70 mg/dl or below: B. If the result meets the hospital definition of a critical result, follow hospital policy. A. Give patient an 8 ounce Glucerna Shake, an 8 ounce Ensure, or 8 ounces of a Glucerna/Ensure equivalent dietary supplement*. B. Wait 30 minutes. If result is 71 mg/dl to 161 mg/dl: C. Retest patients capillary blood glucose (CBG). D. If result greater than or equal to 110 mg/dl, proceed with HBO. If result less than 110 mg/dl, notify HBO [name], [name] ([id number]) [id number] Page 5 of 10 physician and consider holding HBO. If result is 131 mg/dl to 811 mg/dl: A. Proceed with HBO. A. Notify HBO physician and await physician orders. B. It is recommended to hold HBO and do If result is 250 mg/dl or greater: blood/urine ketone testing. C. If the result meets the hospital definition of a critical result, follow hospital policy. POST-HBO GLYCEMIA INTERVENTIONS ACTION INTERVENTION Obtain post HBO capillary blood glucose (ensure 1 physician order is in chart). A. Notify HBO physician  and await physician orders. 2 If result is 70 mg/dl or below: B. If the result meets the hospital definition of a critical result, follow hospital policy. A. Give patient an 8 ounce Glucerna Shake, an 8 ounce Ensure, or 8 ounces of a Glucerna/Ensure equivalent dietary supplement*. B. Wait 15 minutes for symptoms of If result is 71 mg/dl to 914 mg/dl: hypoglycemia (i.e. nervousness, anxiety, sweating, chills, clamminess, irritability, confusion, tachycardia or dizziness). C. If patient asymptomatic, discharge patient. If patient symptomatic, repeat capillary blood glucose (CBG) and notify HBO physician. If result is 101 mg/dl to 782 mg/dl: A. Discharge patient. A. Notify HBO physician and await physician orders. B. It is recommended to do blood/urine ketone If result is 250 mg/dl or greater: testing. C. If the result meets the hospital definition of a critical result, follow hospital policy. *Juice or candies are NOT equivalent products. If patient refuses the Glucerna or Ensure, please consult the hospital dietitian for an appropriate substitute. Electronic Signature(s) Signed: 01/16/2023 4:28:03 PM By: Shawn Stall RN, BSN Signed: 01/16/2023 4:37:17 PM By: [name] MD Entered By: Shawn Stall on 01/16/2023 15:29:08 Prescription 01/16/2023 -------------------------------------------------------------------------------- [name] MD Patient Name: Provider: 20-Feb-1934 [id number] Date of Birth: NPI#: [id number] Sex: DEA #: [phone] Phone #: License #: [id number] UPN: Patient Address: [address] [facility] [place], [place] [address] [address] [place], [place] [address] [phone] Allergies No Known Allergies Provider's Orders X-ray, foot right - ICD10: E11.621 - x-ray of right foot related to nonhealing diabetic foot  ulcer looking for infection/ osteomyelitis. CPT  code Hand Signature: Date(s): Electronic Signature(s) Signed: 01/16/2023 4:28:03 PM By: Shawn Stall RN, BSN Signed: 01/16/2023 4:37:17 PM By: [name] MD Entered By: Shawn Stall on 01/16/2023 15:29:09 Jasko, Braylen ([id number]) [id number] Page 6 of 10 -------------------------------------------------------------------------------- Problem List Details Patient Name: Date of Service: [name] 01/16/2023 2:45 PM Medical Record Number: [id number] Patient Account Number: [id number] Date of Birth/Sex: Treating RN: [birth year] (89 y.o. [name], [name] Primary Care Provider: [name] Other Clinician: Referring Provider: Treating Provider/Extender: [name] in Treatment: 28 Active Problems ICD-10 Encounter Code Description Active Date MDM Diagnosis L97.512 Non-pressure chronic ulcer of other part of right foot with fat layer exposed 07/04/2022 No Yes L97.514 Non-pressure chronic ulcer of other part of right foot with necrosis of bone 12/26/2022 No Yes E11.621 Type 2 diabetes mellitus with foot ulcer 07/04/2022 No Yes I70.235 Atherosclerosis of native arteries of right leg with ulceration of other part of 07/04/2022 No Yes foot Inactive Problems Resolved Problems Electronic Signature(s) Signed: 01/16/2023 4:37:17 PM By: [name] MD Entered By: [name] on 01/16/2023 15:55:51 -------------------------------------------------------------------------------- Progress Note Details Patient Name: Date of Service: [name] 01/16/2023 2:45 PM Medical Record Number: [id number] Patient Account Number: [id number] Date of Birth/Sex: Treating RN: [birth year] (89 y.o. M) Primary Care Provider: [name] Other Clinician: Referring Provider: Treating Provider/Extender: [name] in Treatment: 28 Subjective History of Present Illness  (HPI) 07/04/2022 [name] is an 89 year old male with a past medical history of uncontrolled, insulin dependent type 2 diabetes, CKD stage III, and COPD that presents to the clinic for a right foot wound. He has an extensive history of peripheral arterial disease to the right leg. He has had a right superficial femoral to posterior tibial artery bypass by Dr. Elonda Husky in 2020. He had a right common femoral to plantar posterior tibial artery bypass on 02/03/2022 by Dr. Lenell Antu and at that time he also had a right toe amputation. He states this wound site had healed completely. Prior to this he had amputations to the first and second digit to the right foot. He presents today with an open wound to the previous right toe amputation site for the past 2 months. He has been following with vein and vascular for this issue. Most recent ABIs were on 06/22/2022 that showed absent waveforms in the right lower extremity. Currently his right lower extremity [name], [name] ([id number]) [id number] Page 7 of 10 bypass is occluded and no plan for another anteriorgram and has he likely has no revascularization options. Currently he has been using Betadine to the wound bed. He denies signs of infection. 07/11/2022 patient arrives in clinic today using Hydrofera Blue on the toe amputation site. The patient is revascularized and is not felt to have any other options for revascularization. We are using Hydrofera Blue and gauze. 5/13; patient presents for follow-up. He has been using Hydrofera Blue to the toe amputation site. He has had no issues. Wound is smaller. He has been approved for [facility] and was agreeable to have this placed in office today. 5/20; patient presents for follow-up. We have been using Kerecis to the wound bed. There is been improvement in healing. Unfortunately patient did not change the outer dressing for a week. There is slight maceration to the periwound.  Unclear why he did not do this. 5/30; patient presents for follow-up. We have been using Kerecis to the wound bed. More maceration to  the periwound today. No signs of infection. 6/10; patient presents for follow-up. He has been using Hydrofera Blue to the wound bed. He has no issues or complaints today. Maceration has almost completely resolved. 6/18; patient presents for follow-up. He has been using blast X and collagen to the wound bed without issues. Wound appears smaller today. 6/28; patient presents for follow-up. He has been using blast X and collagen to the wound bed. The wound is smaller. 7/11; patient presents for follow-up. He has been using blast X and collagen to the wound bed. Wound is smaller. He has slough accumulation. 7/25; patient presents for follow-up. He has been using blast X and collagen to the wound bed. Wound is stable. He denies signs of infection. 8/26; patient presents for follow-up. He has been using blast X and collagen to the wound bed. Wound is slightly smaller. He has no issues or complaints. 8/29; patient presents for follow-up. He has been using blast X and Aquacel Ag to the wound bed. He has been using a Velcro wrap at his toes to keep the toes closer together to help with wound healing. He has no issues or complaints today. Wound is smaller. 9/12; patient presents for follow-up. He has been using blast X and Aquacel Ag to the wound bed. Unfortunately he has developed a pressure injury to the lateral aspect of the foot. He is not sure how this started. No open wound there. He denies pain. 9/23; this patient has a wound on the right foot at the amputation site of her third toe. It is right up against the fourth toe making it somewhat difficult to dress. When he was here last time he had a blister/DTI on the lateral aspect of the fifth MTP 9/30; patient has a wound on the right foot amputation site of his third toe. Probably in the third fourth webspace. I changed him  to endoform last week. The wound looks somewhat better The area over the lateral part of the fifth metatarsal head which was blistered with some central deep tissue looking injury was leaking fluid today. The blister was unroofed. I used pickups and scissors to remove this. I do not know that there is anything technically open here although there is certainly some central tissue at risk 10/7; his original wound at the amputation site in his third fourth webspace looks stable to improved. We have been using endoform. The real concern today is the area over the lateral part of the fifth metatarsal head. This initially looked like a blister with central deep tissue injury I unroofed the blister last week there was nothing technically open but the deep tissue injury was concerning. T oday he comes in with a necrotic surface I remove this. The central part of this area which I am assuming was the deep tissue injury is necrotic I removed some of the necrotic tissue with a #15 scalpel and pickups. There is undermining around the wound edge here. 10/14; his original wound at the amputation site of his third fourth toe webspace looks improved we have gradually been making progress here. Using endoform. Unfortunately the area over the lateral part of the right fifth metatarsal head continues to deteriorate. Necrotic surface last week continues this week. The PCR culture I did last week did not manage to make it to the right lab. I have looked at his x-ray the bone underneath does not look that bad to me however we have not yet had a radiologist interpretation. We have been  using Iodoflex this week. 10/21; the original amputation site of his third fourth toe webspace continues to look reasonably healthy and making slow but steady progress we have been using endoform The area on the right lateral fifth metatarsal head has been deteriorating problem. PCR culture grew MRSA. I have him on doxycycline. There is  exposed bone in the wound under necrotic material. I still do not have the x-ray back from October 7 at least in terms of the report. Addendum 12/27/2022; I have looked back on this patient's vascular status. He is status post a right common femoral to posterior tibial artery bypass in 2020. He had a right common femoral to plantar posterior tibial bypass by Dr. Lenell Antu in November 2023. He also had a right third toe amputation at the same time. The wound that we have been dealing with the third toe amputation site was present after the surgery. He was last seen by vascular surgery on 06/21/2022 and was supposed to follow-up with Dr. Lenell Antu in 3 weeks although I do not see that appointment. I have included the major findings in the last right lower extremity angiogram on 01/21/2022 10/28;Marland Kitchen The x-ray report that I ordered several weeks ago is back. Indeed there was nothing under the fifth met head on the right however notable for bone necrosis under the wound on the left third toe. This suggest osteomyelitis. I am going to extend his doxycycline for 3 further weeks. PCR culture on the lateral foot grew MRSA I sent a secure text message to Dr. Juanetta Gosling. His reply was that there was no additional options for revascularization. The patient went to see Dr. Juanetta Gosling and that is the message they came up with. He did offer him an amputation [o Above-knee]. We have been using endoform to the third toe amputation site. Silver alginate to the new wound over the fifth metatarsal head 11/4; patient continues on doxycycline which she is handling well. He started hyperbaric oxygen today and tolerated the first treatment well. We have been using endoform to the third toe amputation site which was the original wound he came to the clinic for. We are using Santyl and silver alginate to the wound over the fifth metatarsal head which was the wound that opened while he was under observation. Culture grew MRSA and the wound  over the fifth metatarsal head 11/11 continuing doxycycline. He is tolerating hyperbarics although I am continually notified about tachycardia in the 100-110 range. Looking back on EKGs shows this is a longstanding problem sinus tachycardia. He also has left anterior fascicular block. He does not describe chest pain The original wound on the third toe amputation site looks a lot better this is contracting however the area on the right lateral fifth metatarsal head only has a rim of height of granulation tissue around what looks to be periosteum [name], [name] ([id number]) [id number] Page 8 of 10 Objective Constitutional Vitals Time Taken: 3:09 PM, Height: 71 in, Weight: 170 lbs, BMI: 23.7. General Notes: VS done in HBO Integumentary (Hair, Skin) Wound #3 status is Open. Original cause of wound was Gradually Appeared. The date acquired was: 04/07/2022. The wound has been in treatment 28 weeks. The wound is located on the Right Amputation Site - T The wound measures 0.5cm length x 0.2cm width x 0.1cm depth; 0.079cm^2 area and 0.008cm^3 volume. oe. There is Fat Layer (Subcutaneous Tissue) exposed. There is no tunneling or undermining noted. There is a medium amount of serosanguineous drainage noted. The wound margin is epibole.  There is small (1-33%) pink granulation within the wound bed. There is a large (67-100%) amount of necrotic tissue within the wound bed including Adherent Slough. The periwound skin appearance exhibited: Maceration. The periwound skin appearance did not exhibit: Callus, Crepitus, Excoriation, Induration, Rash, Scarring, Dry/Scaly, Atrophie Blanche, Cyanosis, Ecchymosis, Hemosiderin Staining, Mottled, Pallor, Rubor, Erythema. Periwound temperature was noted as No Abnormality. Wound #4 status is Open. Original cause of wound was Shear/Friction. The date acquired was: 11/21/2022. The wound has been in treatment 6 weeks. The wound is located on the  Right,Lateral Foot. The wound measures 3cm length x 2.7cm width x 0.1cm depth; 6.362cm^2 area and 0.636cm^3 volume. There is Fat Layer (Subcutaneous Tissue) exposed. There is no tunneling or undermining noted. There is a medium amount of serosanguineous drainage noted. The wound margin is distinct with the outline attached to the wound base. There is small (1-33%) granulation within the wound bed. There is a large (67-100%) amount of necrotic tissue within the wound bed including Eschar and Adherent Slough. The periwound skin appearance exhibited: Maceration. The periwound skin appearance did not exhibit: Callus, Crepitus, Excoriation, Induration, Rash, Scarring, Dry/Scaly, Atrophie Blanche, Cyanosis, Ecchymosis, Hemosiderin Staining, Mottled, Pallor, Rubor, Erythema. 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 There was a Chemical/Enzymatic/Mechanical debridement performed by Maxwell Caul., MD.. Agent used was Santyl. There was no bleeding. The procedure was tolerated well. Post Debridement Measurements: 3cm length x 2.7cm width x 0.1cm depth; 0.636cm^3 volume. Character of Wound/Ulcer Post Debridement requires further debridement. Severity of Tissue Post Debridement is: Fat layer exposed. Post procedure Diagnosis Wound #4: Same as Pre-Procedure Plan Follow-up Appointments: Return Appointment in 1 week. - Dr. Leanord Hawking 01/23/2023 245pm Return Appointment in 2 weeks. - ****Dr. Lady Gary to cover for Dr. Leanord Hawking 01/30/2023 ****front  office to schedule Other: - x-ray of right foot at any outpatient facility of [facility]. Anesthetic: (In clinic) Topical Lidocaine 5% applied to wound bed Bathing/ Shower/ Hygiene: May shower with protection but do not get wound dressing(s) wet. Protect dressing(s) with water repellant cover (for example, large plastic bag) or a cast cover and may then take shower. Hyperbaric Oxygen Therapy: Wound #3 Right Amputation Site - T oe: Evaluate for HBO Therapy Indication: - Wagner Grade 3 If appropriate for treatment, begin HBOT per protocol: 2.0 ATA for 90 Minutes without Air Breaks T Number of Treatments: - 30 otal One treatments per day (delivered Monday through Friday unless otherwise specified in Special Instructions below): Finger stick Blood Glucose Pre- and Post- HBOT Treatment. Follow Hyperbaric Oxygen Glycemia Protocol Give two 4oz orange juices in addition to Glucerna when the glycemic protocol is used. Afrin (Oxymetazoline HCL) 0.05% nasal spray - 1 spray in both nostrils daily as needed prior to HBO treatment for difficulty clearing ears Radiology ordered were: X-ray, foot right - x-ray of right foot related to nonhealing diabetic foot ulcer looking for infection/ osteomyelitis. CPT code WOUND #3: - Amputation Site - T oe Wound Laterality: Right [name], [name] ([id number]) [id number] Page 9 of 10 Cleanser: Vashe 5.8 (oz) Every Other Day/15 Days Discharge Instructions: Cleanse the  wound with Vashe prior to applying a clean dressing using gauze sponges, not tissue or cotton balls. Topical: Skintegrity Hydrogel 4 (oz) Every Other Day/15 Days Discharge Instructions: Apply hydrogel as directed Prim Dressing: Endoform 2x2 in Every Other Day/15 Days ary Discharge Instructions: Moisten with saline Secondary Dressing: Woven Gauze Sponge, Non-Sterile 4x4 in (Generic) Every Other Day/15 Days Discharge Instructions: Apply over primary dressing as  directed. Secured With: Paper T ape, 2x10 (in/yd) Every Other Day/15 Days Discharge Instructions: Secure dressing with tape as directed. WOUND #4: - Foot Wound Laterality: Right, Lateral Cleanser: Soap and Water 1 x Per Day/30 Days Discharge Instructions: May shower and wash wound with dial antibacterial soap and water prior to dressing change. Cleanser: Wound Cleanser 1 x Per Day/30 Days Discharge Instructions: Cleanse the wound with wound cleanser prior to applying a clean dressing using gauze sponges, not tissue or cotton balls. Peri-Wound Care: Skin Prep 1 x Per Day/30 Days Discharge Instructions: Use skin prep as directed Prim Dressing: Maxorb Extra Ag+ Alginate Dressing, 2x2 (in/in) 1 x Per Day/30 Days ary Discharge Instructions: Apply over the santyl as instructed Prim Dressing: Santyl Ointment 1 x Per Day/30 Days ary Discharge Instructions: Apply nickel thick amount to wound bed as instructed Secondary Dressing: Optifoam Non-Adhesive Dressing, 4x4 in 1 x Per Day/30 Days Discharge Instructions: Apply foam donut. Secondary Dressing: Woven Gauze Sponges 2x2 in 1 x Per Day/30 Days Discharge Instructions: Apply over primary dressing as directed. Secured With: Insurance underwriter, Sterile 2x75 (in/in) 1 x Per Day/30 Days Discharge Instructions: Secure with stretch gauze as directed. Secured With: 11M Medipore H Soft Cloth Surgical T ape, 4 x 10 (in/yd) 1 x Per Day/30 Days Discharge Instructions: Secure with tape as directed. 1. The patient is complaining of more pain when he tries to put the foot down. This does not sound like a typical ischemic pattern. I am going to do an x-ray 2. The original wound looks healthy and is contracting. His secondary wound has a rim of granulation but a large wound with exposed periosteum. 3. Still with Santyl and silver alginate. 4. Continue with HBO Electronic Signature(s) Signed: 01/16/2023 4:37:17 PM By: [name] MD Entered By:  [name] on 01/16/2023 16:01:54 -------------------------------------------------------------------------------- SuperBill Details Patient Name: Date of Service: [name] 01/16/2023 Medical Record Number: [id number] Patient Account Number: [id number] Date of Birth/Sex: Treating RN: 01/09/1934 (89 y.o. [name] Primary Care Provider: [name] Other Clinician: Referring Provider: Treating Provider/Extender: [name] in Treatment: 28 Diagnosis Coding ICD-10 Codes Code Description [phone] Non-pressure chronic ulcer of other part of right foot with fat layer exposed L97.514 Non-pressure chronic ulcer of other part of right foot with necrosis of bone E11.621 Type 2 diabetes mellitus with foot ulcer I70.235 Atherosclerosis of native arteries of right leg with ulceration of other part of foot Facility Procedures : CPT4 Code: [id number] Description: 84696 - DEBRIDE W/O ANES NON SELECT Modifier: Quantity: 1 Physician Procedures : CPT4 Code Description Modifier [id number] 99213 - WC PHYS LEVEL 3 - EST PT ICD-10 Diagnosis Description L97.512 Non-pressure chronic ulcer of other part of right foot with fat layer exposed [name], [name] ([id number])  [id number] L97.514 Non-pressure chronic ulcer of other part of right foot with necrosis of bone E11.621 Type 2 diabetes mellitus with foot ulcer I70.235 Atherosclerosis of native arteries of right leg with ulceration of other  part of foot Quantity: 1 df Page 10 of 10 Electronic Signature(s) Signed: 01/16/2023 4:37:17 PM By: Leanord Hawking,  Casimiro Needle MD Entered By: [name] on 01/16/2023 16:02:20

## 2023-01-16 NOTE — Progress Notes (Signed)
[name], Tatsuya ([id number]) [id number] Page 1 of 2 Visit Report for 01/16/2023 Problem List Details Patient Name: Date of Service: [name] [name] 01/16/2023 12:00 PM Medical Record Number: [id number] Patient Account Number: [id number] Date of Birth/Sex: Treating RN: 1934-03-02 (89 y.o. [name] Primary Care Provider: [name] Other Clinician: [name] Referring Provider: Treating Provider/Extender: [name] in Treatment: 28 Active Problems ICD-10 Encounter Code Description Active Date MDM Diagnosis L97.512 Non-pressure chronic ulcer of other part of right foot with fat 07/04/2022 No Yes layer exposed L97.514 Non-pressure chronic ulcer of other part of right foot with 12/26/2022 No Yes necrosis of bone E11.621 Type 2 diabetes mellitus with foot ulcer 07/04/2022 No Yes I70.235 Atherosclerosis of native arteries of right leg with ulceration of 07/04/2022 No Yes other part of foot Inactive Problems Resolved Problems Electronic Signature(s) Signed: 01/16/2023 2:45:51 PM By: [name] Signed: 01/16/2023 4:37:17 PM By: [name] MD Entered By: [name] on 01/16/2023 11:45:51 -------------------------------------------------------------------------------- SuperBill Details Patient Name: Date of Service: [name] [name] 01/16/2023 Medical Record Number: [id number] Patient Account Number: [id number] Date of Birth/Sex: Treating RN: Jul 19, 1933 (89 y.o. [name] Primary Care Provider: [name] Other Clinician: [name] Referring Provider: Treating Provider/Extender: [name] in Treatment: 28 Diagnosis Coding ICD-10 Codes Code Description KAIDON, CASTELLANO ([id number]) [id number] Page 2 of 2 [phone] Non-pressure chronic ulcer of other part of right foot with fat layer exposed L97.514  Non-pressure chronic ulcer of other part of right foot with necrosis of bone E11.621 Type 2 diabetes mellitus with foot ulcer I70.235 Atherosclerosis of native arteries of right leg with ulceration of other part of foot Facility Procedures : CPT4 Code Description: [id number] G0277-(Facility Use Only) HBOT full body chamber, , ICD-10 Diagnosis Description E11.621 Type 2 diabetes mellitus with foot ulcer L97.514 Non-pressure chronic ulcer of other part of right foot with n I70.235  Atherosclerosis of native arteries of right leg with ulcerati Modifier: ecrosis of bone on of other part Quantity: 4 of foot Physician Procedures : CPT4 Code Description Modifier [id number] 84166 - WC PHYS HYPERBARIC OXYGEN THERAPY ICD-10 Diagnosis Description E11.621 Type 2 diabetes mellitus with foot ulcer L97.514 Non-pressure chronic ulcer of other part of right foot with necrosis of bone I70.235  Atherosclerosis of native arteries of right leg with ulceration of other part Quantity: 1 of foot Electronic Signature(s) Signed: 01/16/2023 2:45:45 PM By: [name] Signed: 01/16/2023 4:37:17 PM By: [name] MD Entered By: [name] on 01/16/2023 11:45:45

## 2023-01-16 NOTE — Progress Notes (Signed)
[name], [name] ([id number]) [id number] Page 1 of 9 Visit Report for 01/09/2023 Arrival Information Details Patient Name: Date of Service: [name] 01/09/2023 2:45 PM Medical Record Number: [id number] Patient Account Number: [id number] Date of Birth/Sex: Treating RN: [birth year] (89 y.o. M) Primary Care Lilit Cinelli: [name] Other Clinician: Referring Jaslynn Thome: Treating Armina Galloway/Extender: [name] in Treatment: 27 Visit Information History Since Last Visit Added or deleted any medications: No Patient Arrived: Ambulatory Any new allergies or adverse reactions: No Arrival Time: 15:44 Had a fall or experienced change in No Accompanied By: daughter activities of daily living that may affect Transfer Assistance: None risk of falls: Patient Identification Verified: Yes Signs or symptoms of abuse/neglect since last visito No Secondary Verification Process Completed: Yes Hospitalized since last visit: No Patient Requires Transmission-Based Precautions: No Implantable device outside of the clinic excluding No Patient Has Alerts: Yes cellular tissue based products placed in the center Patient Alerts: Patient on Blood Thinner since last visit: Has Dressing in Place as Prescribed: Yes Pain Present Now: No Electronic Signature(s) Signed: 01/13/2023 12:52:25 PM By: Thayer Dallas Entered By: Thayer Dallas on 01/09/2023 12:46:26 -------------------------------------------------------------------------------- Complex / Palliative Patient Assessment Details Patient Name: Date of Service: [name] 01/09/2023 2:45 PM Medical Record Number: [id number] Patient Account Number: [id number] Date of Birth/Sex: Treating RN: [birth year] (89 y.o. [name] Primary Care Melina Mosteller: [name] Other Clinician: Referring Oneida Mckamey: Treating Oluwaseyi Tull/Extender: [name] in Treatment:  27 Complex Wound Management Criteria Evaluation by a vascular surgeon has determined that the patient is not a revascularization candidate due to: Per Vascular there is no additional options for revascularization. Patient has remarkable or complex co-morbidities requiring medications or treatments that extend wound healing times. Examples: Diabetes mellitus with chronic renal failure or end stage renal disease requiring dialysis Advanced or poorly controlled rheumatoid arthritis Diabetes mellitus and end stage chronic obstructive pulmonary disease Active cancer with current chemo- or radiation therapy osteomyelitis, PAD, DM type II, COPD, GOUT Arthritis. , Palliative Wound Management Criteria Care Approach Wound Care Plan: Complex Wound Management Electronic Signature(s) Signed: 01/13/2023 11:19:54 AM By: Shawn Stall RN, BSN Signed: 01/16/2023 4:37:17 PM By: [name] MD Entered By: Shawn Stall on 01/13/2023 08:19:54 [name], [name] ([id number]) [id number] Page 2 of 9 -------------------------------------------------------------------------------- Encounter Discharge Information Details Patient Name: Date of Service: [name] 01/09/2023 2:45 PM Medical Record Number: [id number] Patient Account Number: [id number] Date of Birth/Sex: Treating RN: Apr 18, 1933 (89 y.o. [name] Primary Care Shanin Szymanowski: [name] Other Clinician: Referring Dannya Pitkin: Treating [name]/Extender: [name] in Treatment: 27 Encounter Discharge Information Items Post Procedure Vitals Discharge Condition: Stable Unable to obtain vitals Reason: see vital signs Ambulatory Status: Ambulatory Discharge Destination: Home Transportation: Private Auto Accompanied By: daughter Schedule Follow-up Appointment: Yes Clinical Summary of Care: Electronic Signature(s) Signed: 01/10/2023 12:20:53 PM By: Shawn Stall RN, BSN Entered  By: Shawn Stall on 01/09/2023 13:10:28 -------------------------------------------------------------------------------- Lower Extremity Assessment Details Patient Name: Date of Service: [name] 01/09/2023 2:45 PM Medical Record Number: [id number] Patient Account Number: [id number] Date of Birth/Sex: Treating RN: [birth year] (89 y.o. M) Primary Care Lameisha Schuenemann: [name] Other Clinician: Referring Manus Weedman: Treating Venna Berberich/Extender: [name] in Treatment: 27 Edema Assessment Assessed: [Left: No] [Right: No] Edema: [Left: N] [Right: o] Calf Left: Right: Point of Measurement: From Medial Instep 31.6 cm Ankle Left: Right: Point of Measurement: From Medial Instep 19.2 cm Vascular Assessment Extremity colors, hair growth,  and conditions: Extremity Color: [Right:Normal] Hair Growth on Extremity: [Right:No] Temperature of Extremity: [Right:Warm] Capillary Refill: [Right:< 3 seconds] Dependent Rubor: [Right:No No] Electronic Signature(s) Signed: 01/13/2023 12:52:25 PM By: [name], [name] ([id number]) [id number] Page 3 of 9 Entered By: Thayer Dallas on 01/09/2023 12:54:03 -------------------------------------------------------------------------------- Multi Wound Chart Details Patient Name: Date of Service: [name] 01/09/2023 2:45 PM Medical Record Number: [id number] Patient Account Number: [id number] Date of Birth/Sex: Treating RN: 09-02-1933 (89 y.o. M) Primary Care Shedric Fredericks: [name] Other Clinician: Referring Jaclynne Baldo: Treating Jamiah Homeyer/Extender: [name] in Treatment: 18 [3:Photos:] [N/A:N/A] Right Amputation Site - Toe Right, Lateral Foot N/A Wound Location: Gradually Appeared Shear/Friction N/A Wounding Event: Diabetic Wound/Ulcer of the Lower Diabetic Wound/Ulcer of the Lower N/A Primary Etiology: Extremity Extremity N/A  Pressure Ulcer N/A Secondary Etiology: Cataracts, Anemia, Asthma, Chronic Cataracts, Anemia, Asthma, Chronic N/A Comorbid History: Obstructive Pulmonary Disease Obstructive Pulmonary Disease (COPD), Peripheral Arterial Disease, (COPD), Peripheral Arterial Disease, Type II Diabetes, Gout, Osteoarthritis, Type II Diabetes, Gout, Osteoarthritis, Neuropathy Neuropathy 04/07/2022 11/21/2022 N/A Date Acquired: 27 5 N/A Weeks of Treatment: Open Open N/A Wound Status: No No N/A Wound Recurrence: 0.4x0.2x0.1 3x2.6x0.1 N/A Measurements L x W x D (cm) 0.063 6.126 N/A A (cm) : rea 0.006 0.613 N/A Volume (cm) : 98.00% -18.20% N/A % Reduction in A rea: 99.40% -18.30% N/A % Reduction in Volume: Grade 3 Grade 1 N/A Classification: Medium Medium N/A Exudate A mount: Serosanguineous Serosanguineous N/A Exudate Type: red, brown red, brown N/A Exudate Color: Epibole Distinct, outline attached N/A Wound Margin: Small (1-33%) None Present (0%) N/A Granulation A mount: Red, Pink N/A N/A Granulation Quality: Large (67-100%) Large (67-100%) N/A Necrotic A mount: Adherent Slough Eschar, Adherent Slough N/A Necrotic Tissue: Fat Layer (Subcutaneous Tissue): Yes Fat Layer (Subcutaneous Tissue): Yes N/A Exposed Structures: Fascia: No Fascia: No Tendon: No Tendon: No Muscle: No Muscle: No Joint: No Joint: No Bone: No Bone: No Medium (34-66%) Small (1-33%) N/A Epithelialization: N/A Debridement - Selective/Open Wound N/A Debridement: Pre-procedure Verification/Time Out N/A 15:58 N/A Taken: N/A Lidocaine 4% Topical Solution N/A Pain Control: N/A Slough N/A Tissue Debrided: N/A Non-Viable Tissue N/A Level: N/A 6.12 N/A Debridement A (sq cm): rea N/A Curette N/A Instrument: N/A Minimum N/A Bleeding: N/A Pressure N/A Hemostasis A chieved: N/A 0 N/A Procedural Pain: N/A 0 N/A Post Procedural Pain: N/A Procedure was tolerated well N/A Debridement Treatment Response: N/A  3x2.6x0.1 N/A Post Debridement Measurements L x W x D (cm) N/A 0.613 N/A Post Debridement Volume: (cm) Excoriation: No Excoriation: No N/A Periwound Skin TextureTamatoa Putman, [name] ([id number]) [id number] Page 4 of 9 Induration: No Induration: No Callus: No Callus: No Crepitus: No Crepitus: No Rash: No Rash: No Scarring: No Scarring: No Maceration: Yes Maceration: Yes N/A Periwound Skin Moisture: Dry/Scaly: No Dry/Scaly: No Atrophie Blanche: No Atrophie Blanche: No N/A Periwound Skin Color: Cyanosis: No Cyanosis: No Ecchymosis: No Ecchymosis: No Erythema: No Erythema: No Hemosiderin Staining: No Hemosiderin Staining: No Mottled: No Mottled: No Pallor: No Pallor: No Rubor: No Rubor: No No Abnormality N/A N/A Temperature: N/A Debridement N/A Procedures Performed: Treatment Notes Wound #3 (Amputation Site - Toe) Wound Laterality: Right Cleanser Vashe 5.8 (oz) Discharge Instruction: Cleanse the wound with Vashe prior to applying a clean dressing using gauze sponges, not tissue or cotton balls. Peri-Wound Care Topical Skintegrity Hydrogel 4 (oz) Discharge Instruction: Apply hydrogel as directed Primary Dressing Endoform 2x2 in Discharge Instruction: Moisten with saline Secondary Dressing Woven Gauze Sponge, Non-Sterile 4x4 in Discharge Instruction:  Apply over primary dressing as directed. Secured With Paper Tape, 2x10 (in/yd) Discharge Instruction: Secure dressing with tape as directed. Compression Wrap Compression Stockings Add-Ons Wound #4 (Foot) Wound Laterality: Right, Lateral Cleanser Soap and Water Discharge Instruction: May shower and wash wound with dial antibacterial soap and water prior to dressing change. Wound Cleanser Discharge Instruction: Cleanse the wound with wound cleanser prior to applying a clean dressing using gauze sponges, not tissue or cotton balls. Peri-Wound Care Skin Prep Discharge Instruction:  Use skin prep as directed Topical Primary Dressing Maxorb Extra Ag+ Alginate Dressing, 2x2 (in/in) Discharge Instruction: Apply over the santyl as instructed Santyl Ointment Discharge Instruction: Apply nickel thick amount to wound bed as instructed Secondary Dressing Optifoam Non-Adhesive Dressing, 4x4 in Discharge Instruction: Apply foam donut. Woven Gauze Sponges 2x2 in Discharge Instruction: Apply over primary dressing as directed. Secured With [name], [name] ([id number]) [id number] Page 5 of 9 Conforming Stretch Gauze Bandage, Sterile 2x75 (in/in) Discharge Instruction: Secure with stretch gauze as directed. 31M Medipore H Soft Cloth Surgical T ape, 4 x 10 (in/yd) Discharge Instruction: Secure with tape as directed. Compression Wrap Compression Stockings Add-Ons Electronic Signature(s) Signed: 01/09/2023 4:50:34 PM By: [name] MD Entered By: [name] on 01/09/2023 13:13:14 -------------------------------------------------------------------------------- Multi-Disciplinary Care Plan Details Patient Name: Date of Service: [name] 01/09/2023 2:45 PM Medical Record Number: [id number] Patient Account Number: [id number] Date of Birth/Sex: Treating RN: 1933-09-28 (89 y.o. [name] Primary Care Kodi Guerrera: [name] Other Clinician: Referring Kassiah Mccrory: Treating [name]/Extender: [name] in Treatment: 27 Active Inactive Wound/Skin Impairment Nursing Diagnoses: Impaired tissue integrity Knowledge deficit related to ulceration/compromised skin integrity Goals: Patient will have a decrease in wound volume by X% from date: (specify in notes) Date Initiated: 07/04/2022 Target Resolution Date: 02/04/2023 Goal Status: Active Patient/caregiver will verbalize understanding of skin care regimen Date Initiated: 07/04/2022 Target Resolution Date: 02/04/2023 Goal Status: Active Ulcer/skin  breakdown will have a volume reduction of 30% by week 4 Date Initiated: 07/04/2022 Date Inactivated: 10/27/2022 Target Resolution Date: 11/05/2022 Goal Status: Met Ulcer/skin breakdown will have a volume reduction of 50% by week 8 Date Initiated: 07/04/2022 Date Inactivated: 10/27/2022 Target Resolution Date: 11/05/2022 Goal Status: Met Interventions: Assess patient/caregiver ability to obtain necessary supplies Assess patient/caregiver ability to perform ulcer/skin care regimen upon admission and as needed Assess ulceration(s) every visit Notes: Electronic Signature(s) Signed: 01/10/2023 12:20:53 PM By: Shawn Stall RN, BSN Entered By: Shawn Stall on 01/09/2023 13:04:25 [name], [name] ([id number]) [id number] Page 6 of 9 -------------------------------------------------------------------------------- Pain Assessment Details Patient Name: Date of Service: [name] 01/09/2023 2:45 PM Medical Record Number: [id number] Patient Account Number: [id number] Date of Birth/Sex: Treating RN: Nov 05, 1933 (89 y.o. M) Primary Care Shruthi Northrup: [name] Other Clinician: Referring Yared Susan: Treating [name]/Extender: [name] in Treatment: 27 Active Problems Location of Pain Severity and Description of Pain Patient Has Paino No Site Locations Pain Management and Medication Current Pain Management: Electronic Signature(s) Signed: 01/13/2023 12:52:25 PM By: Thayer Dallas Entered By: Thayer Dallas on 01/09/2023 12:53:40 -------------------------------------------------------------------------------- Patient/Caregiver Education Details Patient Name: Date of Service: [name] 11/4/2024andnbsp2:45 PM Medical Record Number: [id number] Patient Account Number: [id number] Date of Birth/Gender: Treating RN: 04/07/1933 (89 y.o. [name] Primary Care Physician: [name] Other Clinician: Referring  Physician: Treating Physician/Extender: [name] in Treatment: 27 Education Assessment Education Provided To: Patient Education Topics Provided Wound/Skin Impairment: Handouts: Caring for Your Ulcer Methods: Explain/Verbal Responses: Reinforcements needed [name], [name] (  [id number]) [id number] Page 7 of 9 Electronic Signature(s) Signed: 01/10/2023 12:20:53 PM By: Shawn Stall RN, BSN Entered By: Shawn Stall on 01/09/2023 13:05:30 -------------------------------------------------------------------------------- Wound Assessment Details Patient Name: Date of Service: [name] 01/09/2023 2:45 PM Medical Record Number: [id number] Patient Account Number: [id number] Date of Birth/Sex: Treating RN: 04-23-1933 (89 y.o. M) Primary Care Arcadia Gorgas: [name] Other Clinician: Referring Telia Amundson: Treating Nina Mondor/Extender: [name] in Treatment: 27 Wound Status Wound Number: 3 Primary Diabetic Wound/Ulcer of the Lower Extremity Etiology: Wound Location: Right Amputation Site - Toe Wound Open Wounding Event: Gradually Appeared Status: Date Acquired: 04/07/2022 Comorbid Cataracts, Anemia, Asthma, Chronic Obstructive Pulmonary Weeks Of Treatment: 27 History: Disease (COPD), Peripheral Arterial Disease, Type II Diabetes, Clustered Wound: No Gout, Osteoarthritis, Neuropathy Photos Wound Measurements Length: (cm) 0.4 Width: (cm) 0.2 Depth: (cm) 0.1 Area: (cm) 0.063 Volume: (cm) 0.006 % Reduction in Area: 98% % Reduction in Volume: 99.4% Epithelialization: Medium (34-66%) Tunneling: No Undermining: No Wound Description Classification: Grade 3 Wound Margin: Epibole Exudate Amount: Medium Exudate Type: Serosanguineous Exudate Color: red, brown Foul Odor After Cleansing: No Slough/Fibrino No Wound Bed Granulation Amount: Small (1-33%) Exposed Structure Granulation Quality:  Red, Pink Fascia Exposed: No Necrotic Amount: Large (67-100%) Fat Layer (Subcutaneous Tissue) Exposed: Yes Necrotic Quality: Adherent Slough Tendon Exposed: No Muscle Exposed: No Joint Exposed: No Bone Exposed: No Periwound Skin Texture Texture Color No Abnormalities Noted: No No Abnormalities Noted: No Callus: No Atrophie Blanche: No Crepitus: No Cyanosis: No [name], [name] ([id number]) [id number] Page 8 of 9 Excoriation: No Ecchymosis: No Induration: No Erythema: No Rash: No Hemosiderin Staining: No Scarring: No Mottled: No Pallor: No Moisture Rubor: No No Abnormalities Noted: No Dry / Scaly: No Temperature / Pain Maceration: Yes Temperature: No Abnormality Electronic Signature(s) Signed: 01/13/2023 12:52:25 PM By: Thayer Dallas Entered By: Thayer Dallas on 01/09/2023 12:57:19 -------------------------------------------------------------------------------- Wound Assessment Details Patient Name: Date of Service: [name] 01/09/2023 2:45 PM Medical Record Number: [id number] Patient Account Number: [id number] Date of Birth/Sex: Treating RN: Aug 11, 1933 (89 y.o. M) Primary Care Zack Crager: [name] Other Clinician: Referring Shanaiya Bene: Treating Kindel Rochefort/Extender: [name] in Treatment: 27 Wound Status Wound Number: 4 Primary Diabetic Wound/Ulcer of the Lower Extremity Etiology: Wound Location: Right, Lateral Foot Secondary Pressure Ulcer Wounding Event: Shear/Friction Etiology: Date Acquired: 11/21/2022 Wound Open Weeks Of Treatment: 5 Status: Clustered Wound: No Comorbid Cataracts, Anemia, Asthma, Chronic Obstructive Pulmonary History: Disease (COPD), Peripheral Arterial Disease, Type II Diabetes, Gout, Osteoarthritis, Neuropathy Photos Wound Measurements Length: (cm) 3 Width: (cm) 2.6 Depth: (cm) 0.1 Area: (cm) 6.126 Volume: (cm) 0.613 % Reduction in Area: -18.2% %  Reduction in Volume: -18.3% Epithelialization: Small (1-33%) Tunneling: No Undermining: No Wound Description Classification: Grade 1 Wound Margin: Distinct, outline attached Exudate Amount: Medium Exudate Type: Serosanguineous Exudate Color: red, brown Foul Odor After Cleansing: No Slough/Fibrino No Wound Bed Granulation Amount: None Present (0%) Exposed Structure Necrotic Amount: Large (67-100%) Fascia Exposed: No Necrotic Quality: Eschar, Adherent Slough Fat Layer (Subcutaneous Tissue) Exposed: Yes Tendon Exposed: No [name], [name] ([id number]) [id number] Page 9 of 9 Muscle Exposed: No Joint Exposed: No Bone Exposed: No Periwound Skin Texture Texture Color No Abnormalities Noted: No No Abnormalities Noted: No Callus: No Atrophie Blanche: No Crepitus: No Cyanosis: No Excoriation: No Ecchymosis: No Induration: No Erythema: No Rash: No Hemosiderin Staining: No Scarring: No Mottled: No Pallor: No Moisture Rubor: No No Abnormalities Noted: No Dry / Scaly: No Maceration: Yes Electronic Signature(s) Signed: 01/13/2023 12:52:25 PM By:  Thayer Dallas Entered By: Thayer Dallas on 01/09/2023 12:57:58 -------------------------------------------------------------------------------- Vitals Details Patient Name: Date of Service: [name] 01/09/2023 2:45 PM Medical Record Number: [id number] Patient Account Number: [id number] Date of Birth/Sex: Treating RN: [birth year] (89 y.o. M) Primary Care Willean Schurman: [name] Other Clinician: Referring Mariapaula Krist: Treating Kerri Asche/Extender: [name] in Treatment: 27 Vital Signs Time Taken: 15:27 Temperature (F): 97.3 Height (in): 71 Pulse (bpm): 90 Weight (lbs): 170 Respiratory Rate (breaths/min): 18 Body Mass Index (BMI): 23.7 Blood Pressure (mmHg): 162/98 Capillary Blood Glucose (mg/dl): 85 Reference Range: 80 - 120 mg / dl Electronic  Signature(s) Signed: 01/09/2023 4:32:03 PM By: Haywood Pao CHT EMT BS , , Entered By: Haywood Pao on 01/09/2023 13:32:03

## 2023-01-16 NOTE — Progress Notes (Signed)
BAQUERO, Sevag ([id number]) [id number] Page 1 of 2 Visit Report for 01/13/2023 Problem List Details Patient Name: Date of Service: [name] 01/13/2023 9:30 A M Medical Record Number: [id number] Patient Account Number: [id number] Date of Birth/Sex: Treating RN: 09-22-1933 (89 y.o. 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 E11.621 Type 2 diabetes mellitus with foot ulcer 07/04/2022 No Yes I70.235 Atherosclerosis of native arteries of right leg with ulceration of 07/04/2022 No Yes other part of foot Inactive Problems Resolved Problems Electronic Signature(s) Signed: 01/13/2023 12:38:34 PM By: [name] Signed: 01/16/2023 7:42:12 AM By: Duanne Guess MD FACS Entered By: [name] on 01/13/2023 09:38:34 -------------------------------------------------------------------------------- SuperBill Details Patient Name: Date of Service: [name] 01/13/2023 Medical Record Number: [id number] Patient Account Number: [id number] Date of Birth/Sex: Treating RN: 13-Jan-1934 (89 y.o. [name] Primary Care Provider: [name] Other Clinician: [name] Referring Provider: Treating Provider/Extender: Stacey Drain Weeks in Treatment: 27 Diagnosis Coding ICD-10 Codes Code Description [name], [name] ([id number]) [id number] Page 2 of 2 [phone] Non-pressure chronic ulcer of other part of right foot with fat layer exposed L97.514  Non-pressure chronic ulcer of other part of right foot with necrosis of bone E11.621 Type 2 diabetes mellitus with foot ulcer I70.235 Atherosclerosis of native arteries of right leg with ulceration of other part of foot Facility Procedures : CPT4 Code Description: [id number] G0277-(Facility Use Only) HBOT full body chamber, , ICD-10 Diagnosis Description E11.621 Type 2 diabetes mellitus with foot ulcer L97.514 Non-pressure chronic ulcer of other part of right foot with n I70.235  Atherosclerosis of native arteries of right leg with ulcerati Modifier: ecrosis of bone on of other part Quantity: 4 of foot Physician Procedures : CPT4 Code Description Modifier [id number] - WC PHYS HYPERBARIC OXYGEN THERAPY ICD-10 Diagnosis Description E11.621 Type 2 diabetes mellitus with foot ulcer 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.512  Non-pressure chronic ulcer of other part of right foot with fat layer exposed Quantity: 1 Electronic Signature(s) Signed: 01/13/2023 12:31:26 PM By: [name] Signed: 01/16/2023 7:42:12 AM By: Duanne Guess MD FACS Entered By: [name] on 01/13/2023 09:31:25

## 2023-01-16 NOTE — Progress Notes (Addendum)
[name], [name] ([id number]) [id number] Page 1 of 2 Visit Report for 01/16/2023 Arrival Information Details Patient Name: Date of Service: [name] [name] 01/16/2023 12:00 PM Medical Record Number: [id number] Patient Account Number: [id number] Date of Birth/Sex: Treating RN: 1933-03-23 (89 y.o. [name] Primary Care Aundraya Dripps: [name] Other Clinician: [name] Referring Seairra Otani: Treating Dezirae Service/Extender: [name] in Treatment: 28 Visit Information History Since Last Visit All ordered tests and consults were completed: Yes Patient Arrived: Ambulatory Added or deleted any medications: No Arrival Time: 12:11 Any new allergies or adverse reactions: No Accompanied By: daughter Had a fall or experienced change in No Transfer Assistance: None activities of daily living that may affect Patient Identification Verified: Yes risk of falls: Secondary Verification Process Completed: Yes Signs or symptoms of abuse/neglect since last visito No Patient Requires Transmission-Based Precautions: No Hospitalized since last visit: No Patient Has Alerts: Yes Implantable device outside of the clinic excluding No Patient Alerts: Patient on Blood Thinner cellular tissue based products placed in the center since last visit: Pain Present Now: No Electronic Signature(s) Signed: 01/16/2023 1:35:22 PM By: [name] Entered By: [name] on 01/16/2023 10:35:21 -------------------------------------------------------------------------------- Encounter Discharge Information Details Patient Name: Date of Service: [name] [name] 01/16/2023 12:00 PM Medical Record Number: [id number] Patient Account Number: [id number] Date of Birth/Sex: Treating RN: 1933-05-29 (89 y.o. [name] Primary Care Bari Leib: [name] Other Clinician: [name] Referring Slevin Gunby: Treating  Zaydan Papesh/Extender: [name] in Treatment: 28 Encounter Discharge Information Items Discharge Condition: Stable Ambulatory Status: Ambulatory Discharge Destination: Home Transportation: Private Auto Accompanied By: daughter Schedule Follow-up Appointment: Yes Clinical Summary of Care: Electronic Signature(s) Signed: 01/16/2023 2:46:25 PM By: [name] Entered By: [name] on 01/16/2023 11:46:24 Reetz, Gabriele ([id number]) [id number] Page 2 of 2 -------------------------------------------------------------------------------- Vitals Details Patient Name: Date of Service: [name] [name] 01/16/2023 12:00 PM Medical Record Number: [id number] Patient Account Number: [id number] Date of Birth/Sex: Treating RN: Feb 09, 1934 (89 y.o. [name] Primary Care Zanetta Dehaan: [name] Other Clinician: [name] Referring Rotha Cassels: Treating Deavion Dobbs/Extender: [name] in Treatment: 28 Vital Signs Time Taken: 12:12 Temperature (F): 97.3 Height (in): 71 Pulse (bpm): 94 Weight (lbs): 170 Respiratory Rate (breaths/min): 18 Body Mass Index (BMI): 23.7 Blood Pressure (mmHg): 132/75 Reference Range: 80 - 120 mg / dl Notes Informed Dr. Leanord Hawking that Mr, Webb Heart rate was 94 and irregular. Dr. Leanord Hawking came to chamber side to see the patient. Electronic Signature(s) Signed: 01/16/2023 1:38:14 PM By: [name] Entered By: [name] on 01/16/2023 10:38:14

## 2023-01-16 NOTE — Progress Notes (Addendum)
GRONEMEYER, Asriel ([id number]) [id number] Page 1 of 2 Visit Report for 01/16/2023 HBO Details Patient Name: Date of Service: [name] 01/16/2023 12:00 PM Medical Record Number: [id number] Patient Account Number: [id number] Date of Birth/Sex: Treating RN: 14-Mar-1933 (89 y.o. [name] Primary Care Katelen Luepke: [name] Other Clinician: [name] Referring Tanveer Brammer: Treating Talana Slatten/Extender: [name] in Treatment: 28 HBO Treatment Course Details Treatment Course Number: 1 Ordering Orly Quimby: [name] T Treatments Ordered: otal 40 HBO Treatment Start Date: 01/09/2023 HBO Indication: Diabetic Ulcer(s) of the Lower Extremity Standard/Conservative Wound Care tried and failed greater than or equal to 30 days Wound #3 Right Amputation Site - T oe HBO Treatment Details Treatment Number: 6 Patient Type: Outpatient Chamber Type: Monoplace Chamber Serial #: [id number] Treatment Protocol: 2.0 ATA with 90 minutes oxygen, and no air breaks Treatment Details Compression Rate Down: 2.0 psi / minute De-Compression Rate Up: 2.0 psi / minute Air breaks and breathing Decompress Decompress Compress Tx Pressure Begins Reached periods Begins Ends (leave unused spaces blank) Chamber Pressure (ATA 1 2 ------2 1 ) Clock Time (24 hr) 12:40 12:48 - - - - - - 14:19 14:27 Treatment Length: 107 (minutes) Treatment Segments: 4 Vital Signs Capillary Blood Glucose Reference Range: 80 - 120 mg / dl HBO Diabetic Blood Glucose Intervention Range: <131 mg/dl or >295 mg/dl Time Vitals Blood Respiratory Capillary Blood Glucose Pulse Action Type: Pulse: Temperature: Taken: Pressure: Rate: Glucose (mg/dl): Meter #: Oximetry (%) Taken: Pre 12:12 132/75 94 18 97.3 Post 14:29 134/81 91 16 98.4 Treatment Response Treatment Toleration: Well Treatment Completion Status: Treatment Completed without Adverse  Event Additional Procedure Documentation Tissue Sevierity: Necrosis of bone Emerlyn Mehlhoff Notes No concerns with treatment given Physician HBO Attestation: I certify that I supervised this HBO treatment in accordance with Medicare guidelines. A trained emergency response team is readily available per Yes hospital policies and procedures. Continue HBOT as ordered. Yes Electronic Signature(s) Emlenton, Alexandria ([id number]) [id number] Page 2 of 2 Signed: 01/16/2023 4:37:17 PM By: [name] MD Previous Signature: 01/16/2023 2:45:14 PM Version By: [name] Previous Signature: 01/16/2023 1:40:18 PM Version By: [name] Entered By: [name] on 01/16/2023 13:36:38 -------------------------------------------------------------------------------- HBO Safety Checklist Details Patient Name: Date of Service: [name] 01/16/2023 12:00 PM Medical Record Number: [id number] Patient Account Number: [id number] Date of Birth/Sex: Treating RN: 09-03-1933 (89 y.o. [name] Primary Care Jerzy Roepke: [name] Other Clinician: [name] Referring Ariam Mol: Treating Lylianna Fraiser/Extender: [name] in Treatment: 28 HBO Safety Checklist Items Safety Checklist Consent Form Signed Patient voided / foley secured and emptied When did you last eato 1000 Last dose of injectable or oral agent NA Ostomy pouch emptied and vented if applicable NA All implantable devices assessed, documented and approved NA Intravenous access site secured and place NA Valuables secured Linens and cotton and cotton/polyester blend (less than 51% polyester) Personal oil-based products / skin lotions / body lotions removed Wigs or hairpieces removed NA Smoking or tobacco materials removed Books / newspapers / magazines / loose paper removed Cologne, aftershave, perfume and deodorant removed Jewelry removed (may wrap  wedding band) Make-up removed NA Hair care products removed Battery operated devices (external) removed Heating patches and chemical warmers removed Titanium eyewear removed NA Nail polish cured greater than 10 hours NA Casting material cured greater than 10 hours NA Hearing aids removed removed Loose dentures or partials removed removed by patient Prosthetics have been removed NA Patient demonstrates correct use  of air break device (if applicable) Patient concerns have been addressed Patient grounding bracelet on and cord attached to chamber Specifics for Inpatients (complete in addition to above) Medication sheet sent with patient NA Intravenous medications needed or due during therapy sent with patient NA Drainage tubes (e.g. nasogastric tube or chest tube secured and vented) NA Endotracheal or Tracheotomy tube secured NA Cuff deflated of air and inflated with saline NA Airway suctioned NA Notes The safety checklist was done before the treatment was started. Electronic Signature(s) Signed: 01/16/2023 1:39:26 PM By: [name] Entered By: [name] on 01/16/2023 10:39:26

## 2023-01-17 ENCOUNTER — Other Ambulatory Visit: Payer: Self-pay | Admitting: Internal Medicine

## 2023-01-17 ENCOUNTER — Encounter (HOSPITAL_BASED_OUTPATIENT_CLINIC_OR_DEPARTMENT_OTHER): Payer: PPO | Admitting: Internal Medicine

## 2023-01-17 ENCOUNTER — Ambulatory Visit
Admission: RE | Admit: 2023-01-17 | Discharge: 2023-01-17 | Disposition: A | Discharge status: Home / Self Care | Payer: PPO | Source: Ambulatory Visit | Attending: Internal Medicine | Admitting: Internal Medicine

## 2023-01-17 DIAGNOSIS — E11621 Type 2 diabetes mellitus with foot ulcer: Secondary | ICD-10-CM | POA: Diagnosis not present

## 2023-01-17 DIAGNOSIS — M86171 Other acute osteomyelitis, right ankle and foot: Secondary | ICD-10-CM

## 2023-01-17 LAB — GLUCOSE, CAPILLARY
Glucose-Capillary: 186 mg/dL — ABNORMAL HIGH (ref 70–99)
Glucose-Capillary: 131 mg/dL — ABNORMAL HIGH (ref 70–99)

## 2023-01-17 NOTE — Progress Notes (Signed)
MOHAMMAD, Adric ([id number]) [id number] Page 1 of 2 Visit Report for 01/17/2023 Arrival Information Details Patient Name: Date of Service: [name] [name] 01/17/2023 12:00 PM Medical Record Number: [id number] Patient Account Number: [id number] Date of Birth/Sex: Treating RN: [birth year] (89 y.o. [name] Primary Care Coraline Talwar: [name] Other Clinician: Haywood Pao Referring Lyndsie Wallman: Treating Eathan Groman/Extender: [name] in Treatment: 28 Visit Information History Since Last Visit All ordered tests and consults were completed: Yes Patient Arrived: Ambulatory Added or deleted any medications: No Arrival Time: 12:06 Any new allergies or adverse reactions: No Accompanied By: daughter Had a fall or experienced change in No Transfer Assistance: None activities of daily living that may affect Patient Identification Verified: Yes risk of falls: Secondary Verification Process Completed: Yes Signs or symptoms of abuse/neglect since last visito No Patient Requires Transmission-Based Precautions: No Hospitalized since last visit: No Patient Has Alerts: Yes Implantable device outside of the clinic excluding No Patient Alerts: Patient on Blood Thinner cellular tissue based products placed in the center since last visit: Pain Present Now: No Electronic Signature(s) Signed: 01/17/2023 4:09:30 PM By: Haywood Pao CHT EMT BS , , Entered By: Haywood Pao on 01/17/2023 13:09:30 -------------------------------------------------------------------------------- Encounter Discharge Information Details Patient Name: Date of Service: [name] [name] 01/17/2023 12:00 PM Medical Record Number: [id number] Patient Account Number: [id number] Date of Birth/Sex: Treating RN: Jan 10, 1934 (89 y.o. [name] Primary Care Cashlynn Yearwood: [name] Other Clinician: Haywood Pao Referring  Tanysha Quant: Treating Thersea Manfredonia/Extender: [name] in Treatment: 28 Encounter Discharge Information Items Discharge Condition: Stable Ambulatory Status: Ambulatory Discharge Destination: Home Transportation: Private Auto Accompanied By: daughter Schedule Follow-up Appointment: No Clinical Summary of Care: Electronic Signature(s) Signed: 01/17/2023 4:24:14 PM By: Haywood Pao CHT EMT BS , , Entered By: Haywood Pao on 01/17/2023 13:24:14 Miguez, Aceton ([id number]) [id number] Page 2 of 2 -------------------------------------------------------------------------------- Vitals Details Patient Name: Date of Service: [name] [name] 01/17/2023 12:00 PM Medical Record Number: [id number] Patient Account Number: [id number] Date of Birth/Sex: Treating RN: 1933-12-30 (89 y.o. [name] Primary Care Lakeishia Truluck: [name] Other Clinician: Haywood Pao Referring Srinidhi Landers: Treating Kimiah Hibner/Extender: [name] in Treatment: 28 Vital Signs Time Taken: 12:27 Temperature (F): 98.2 Height (in): 71 Pulse (bpm): 101 Weight (lbs): 170 Respiratory Rate (breaths/min): 18 Body Mass Index (BMI): 23.7 Blood Pressure (mmHg): 132/82 Capillary Blood Glucose (mg/dl): 235 Reference Range: 80 - 120 mg / dl Electronic Signature(s) Signed: 01/17/2023 4:09:50 PM By: Haywood Pao CHT EMT BS , , Entered By: Haywood Pao on 01/17/2023 13:09:50

## 2023-01-17 NOTE — Progress Notes (Signed)
SAURER, Tiago ([id number]) [id number] Page 1 of 3 Visit Report for 01/17/2023 HBO Details Patient Name: Date of Service: [name] [name] 01/17/2023 12:00 PM Medical Record Number: [id number] Patient Account Number: [id number] Date of Birth/Sex: Treating RN: 05/30/1933 (89 y.o. [name] Primary Care Dinesha Twiggs: [name] Other Clinician: Haywood Pao Referring Mead Slane: Treating Yaniah Thiemann/Extender: [name] in Treatment: 28 HBO Treatment Course Details Treatment Course Number: 1 Ordering Undine Nealis: [name] T Treatments Ordered: otal 40 HBO Treatment Start Date: 01/09/2023 HBO Indication: Diabetic Ulcer(s) of the Lower Extremity Standard/Conservative Wound Care tried and failed greater than or equal to 30 days Wound #3 Right Amputation Site - T oe HBO Treatment Details Treatment Number: 7 Patient Type: Outpatient Chamber Type: Monoplace Chamber Serial #: [id number] Treatment Protocol: 2.0 ATA with 90 minutes oxygen, and no air breaks Treatment Details Compression Rate Down: 1.0 psi / minute De-Compression Rate Up: 1.5 psi / minute Air breaks and breathing Decompress Decompress Compress Tx Pressure Begins Reached periods Begins Ends (leave unused spaces blank) Chamber Pressure (ATA 1 2 ------2 1 ) Clock Time (24 hr) 12:34 12:48 - - - - - - 14:18 14:30 Treatment Length: 116 (minutes) Treatment Segments: 4 Vital Signs Capillary Blood Glucose Reference Range: 80 - 120 mg / dl HBO Diabetic Blood Glucose Intervention Range: <131 mg/dl or >841 mg/dl Type: Time Vitals Blood Respiratory Capillary Blood Glucose Pulse Action Pulse: Temperature: Taken: Pressure: Rate: Glucose (mg/dl): Meter #: Oximetry (%) Taken: Pre 12:27 132/82 101 18 98.2 186 none per protocol Post 14:33 157/93 92 18 98.2 131 none per protocol Treatment Response Treatment Toleration: Well Treatment Completion  Status: Treatment Completed without Adverse Event Treatment Notes Mr. Chaban arrived with normal vital signs. He stated that he had eaten a meal prior to arrival. He prepared for the treatment. After performing a safety check, he was placed in the chamber which was compressed with 100% oxygen at a rate of 1.5 psi/min after confirming normal ear equalization. He tolerated the treatment and subsequent decompression at the rate of 1.5 psi/min. He denied any issues with ear equalization and/or pain associated with barotrauma. Post-treatment vital signs were within normal range. He was stable upon discharge with his Additional Procedure Documentation Tissue Sevierity: Necrosis of bone Reis Goga Notes No concerns with treatment given Physician HBO Attestation: I certify that I supervised this HBO treatment in accordance with Medicare guidelines. A trained emergency response team is readily available per Yes HRABAK, Travontae ([id number]) [id number] Page 2 of 3 hospital policies and procedures. Continue HBOT as ordered. Yes Electronic Signature(s) Signed: 01/18/2023 10:44:47 AM By: [name] MD Previous Signature: 01/17/2023 4:23:07 PM Version By: Haywood Pao CHT EMT BS , , Entered By: [name] on 01/18/2023 07:43:57 -------------------------------------------------------------------------------- HBO Safety Checklist Details Patient Name: Date of Service: [name] [name] 01/17/2023 12:00 PM Medical Record Number: [id number] Patient Account Number: [id number] Date of Birth/Sex: Treating RN: 27-Jan-1934 (89 y.o. [name] Primary Care Leith Szafranski: [name] Other Clinician: Haywood Pao Referring Henery Betzold: Treating Demarrion Meiklejohn/Extender: [name] in Treatment: 28 HBO Safety Checklist Items Safety Checklist Consent Form Signed Patient voided / foley secured and emptied When did you last eato  [place]unch Last dose of injectable or oral agent Jardiance 0800 Ostomy pouch emptied and vented if applicable NA All implantable devices assessed, documented and approved NA Intravenous access site secured and place NA Valuables secured Linens and cotton and cotton/polyester blend (less than 51% polyester) Personal oil-based products / skin  lotions / body lotions removed Wigs or hairpieces removed NA Smoking or tobacco materials removed NA Books / newspapers / magazines / loose paper removed Cologne, aftershave, perfume and deodorant removed Jewelry removed (may wrap wedding band) Make-up removed NA Hair care products removed Battery operated devices (external) removed Heating patches and chemical warmers removed Titanium eyewear removed Nail polish cured greater than 10 hours NA Casting material cured greater than 10 hours NA Hearing aids removed removed Loose dentures or partials removed dentures removed Prosthetics have been removed NA Patient demonstrates correct use of air break device (if applicable) Patient concerns have been addressed Patient grounding bracelet on and cord attached to chamber Specifics for Inpatients (complete in addition to above) Medication sheet sent with patient NA Intravenous medications needed or due during therapy sent with patient NA Drainage tubes (e.g. nasogastric tube or chest tube secured and vented) NA Endotracheal or Tracheotomy tube secured NA Cuff deflated of air and inflated with saline NA Airway suctioned NA Notes Paper version used prior to treatment start. Electronic Signature(s) Signed: 01/17/2023 4:18:23 PM By: Haywood Pao CHT EMT BS , , [place], Ignatz ([id number]) [id number] Page 3 of 3 Entered By: Haywood Pao on 01/17/2023 13:18:23

## 2023-01-18 ENCOUNTER — Encounter (HOSPITAL_BASED_OUTPATIENT_CLINIC_OR_DEPARTMENT_OTHER): Payer: PPO | Admitting: Internal Medicine

## 2023-01-18 DIAGNOSIS — E11621 Type 2 diabetes mellitus with foot ulcer: Secondary | ICD-10-CM | POA: Diagnosis not present

## 2023-01-18 LAB — GLUCOSE, CAPILLARY: Glucose-Capillary: 135 mg/dL — ABNORMAL HIGH (ref 70–99)

## 2023-01-18 NOTE — Progress Notes (Signed)
[name] [name] ([id number]) [id number] Page 1 of 10 Visit Report for 01/16/2023 Arrival Information Details Patient Name: Date of Service: [name] [name] 01/16/2023 2:45 PM Medical Record Number: [id number] Patient Account Number: [id number] Date of Birth/Sex: Treating RN: [birth year] (89 y.o. M) Primary Care [name] [name]: [name] Other Clinician: Referring Kia Stavros: Treating Azrielle Springsteen/Extender: [name] in Treatment: 28 Visit Information History Since Last Visit Added or deleted any medications: No Patient Arrived: Ambulatory Any new allergies or adverse reactions: No Arrival Time: 14:58 Had a fall or experienced change in No Accompanied By: daughter activities of daily living that may affect Transfer Assistance: None risk of falls: Patient Identification Verified: Yes Signs or symptoms of abuse/neglect since last visito No Secondary Verification Process Completed: Yes Hospitalized since last visit: No Patient Requires Transmission-Based Precautions: No Has Dressing in Place as Prescribed: Yes Patient Has Alerts: Yes Pain Present Now: No Patient Alerts: Patient on Blood Thinner Notes 7/10 while walking. Electronic Signature(s) Signed: 01/18/2023 4:47:30 PM By: [name] [name] Entered By: [name] [name] on 01/16/2023 12:09:46 -------------------------------------------------------------------------------- Encounter Discharge Information Details Patient Name: Date of Service: [name] [name] 01/16/2023 2:45 PM Medical Record Number: [id number] Patient Account Number: [id number] Date of Birth/Sex: Treating RN: 21-Aug-1933 (89 y.o. [name] [name] Primary Care Twala Collings: [name] Other Clinician: Referring Payson Evrard: Treating Azalyn Sliwa/Extender: [name] in Treatment: 28 Encounter Discharge Information Items Post Procedure Vitals Discharge Condition: Stable Unable  to obtain vitals Reason: see post vital signs. Ambulatory Status: Ambulatory Discharge Destination: Home Transportation: Private Auto Accompanied By: daughter Schedule Follow-up Appointment: Yes Clinical Summary of Care: Electronic Signature(s) Signed: 01/16/2023 4:28:03 PM By: [name] Stall RN, BSN Entered By: [name] [name] on 01/16/2023 12:30:50 Feld, [name] [name] ([id number]) [id number] Page 2 of 10 -------------------------------------------------------------------------------- Lower Extremity Assessment Details Patient Name: Date of Service: [name] [name] 01/16/2023 2:45 PM Medical Record Number: [id number] Patient Account Number: [id number] Date of Birth/Sex: Treating RN: [birth year] (89 y.o. M) Primary Care [name] [name]: [name] Other Clinician: Referring Tye Juarez: Treating Izaia Say/Extender: [name] in Treatment: 28 Edema Assessment Assessed: [name] [name]: No] [Right: No] Edema: [Left: N] [Right: o] Calf Left: Right: Point of Measurement: From Medial Instep 32 cm Ankle Left: Right: Point of Measurement: From Medial Instep 21 cm Vascular Assessment Extremity colors, hair growth, and conditions: Extremity Color: [Right:Normal] Hair Growth on Extremity: [Right:No] Temperature of Extremity: [Right:Warm] Capillary Refill: [Right:< 3 seconds] Dependent Rubor: [Right:No No] Electronic Signature(s) Signed: 01/18/2023 4:47:30 PM By: [name] [name] Entered By: [name] [name] on 01/16/2023 12:11:19 -------------------------------------------------------------------------------- Multi Wound Chart Details Patient Name: Date of Service: [name] [name] 01/16/2023 2:45 PM Medical Record Number: [id number] Patient Account Number: [id number] Date of Birth/Sex: Treating RN: March 15, 1933 (89 y.o. M) Primary Care Khori Underberg: [name] Other Clinician: Referring Girtie Wiersma: Treating Pao Haffey/Extender: [name] in Treatment: 28 [3:Photos:] [N/A:N/A] Right Amputation Site - Toe Right, Lateral Foot N/A Wound Location: Gradually Appeared Shear/Friction N/A Wounding Event: Diabetic Wound/Ulcer of the Lower Diabetic Wound/Ulcer of the Lower N/A Primary Etiology: [name] [name] [name] [name] ([id number]) [id number] Page 3 of 10 Extremity Extremity N/A Pressure Ulcer N/A Secondary Etiology: Cataracts, Anemia, Asthma, Chronic Cataracts, Anemia, Asthma, Chronic N/A Comorbid History: Obstructive Pulmonary Disease Obstructive Pulmonary Disease (COPD), Peripheral Arterial Disease, (COPD), Peripheral Arterial Disease, Type II Diabetes, Gout, Osteoarthritis, Type II Diabetes, Gout, Osteoarthritis, Neuropathy Neuropathy 04/07/2022 11/21/2022 N/A Date Acquired: 28 6 N/A Weeks of Treatment: Open Open N/A Wound Status: No No N/A Wound Recurrence: 0.5x0.2x0.1 3x2.7x0.1  N/A Measurements L x W x D (cm) 0.079 6.362 N/A A (cm) : rea 0.008 0.636 N/A Volume (cm) : 97.50% -22.70% N/A % Reduction in A rea: 99.20% -22.80% N/A % Reduction in Volume: Grade 3 Grade 1 N/A Classification: Medium Medium N/A Exudate A mount: Serosanguineous Serosanguineous N/A Exudate Type: red, brown red, brown N/A Exudate Color: Epibole Distinct, outline attached N/A Wound Margin: Small (1-33%) Small (1-33%) N/A Granulation A mount: Pink N/A N/A Granulation Quality: Large (67-100%) Large (67-100%) N/A Necrotic A mount: Adherent Slough Eschar, Adherent Slough N/A Necrotic Tissue: Fat Layer (Subcutaneous Tissue): Yes Fat Layer (Subcutaneous Tissue): Yes N/A Exposed Structures: Fascia: No Fascia: No Tendon: No Tendon: No Muscle: No Muscle: No Joint: No Joint: No Bone: No Bone: No Medium (34-66%) Small (1-33%) N/A Epithelialization: N/A Chemical/Enzymatic/Mechanical N/A Debridement: N/A N/A N/A Instrument: N/A None N/A Bleeding: Debridement Treatment  Response: N/A Procedure was tolerated well N/A Post Debridement Measurements L x N/A 3x2.7x0.1 N/A W x D (cm) N/A 0.636 N/A Post Debridement Volume: (cm) Excoriation: No Excoriation: No N/A Periwound Skin Texture: Induration: No Induration: No Callus: No Callus: No Crepitus: No Crepitus: No Rash: No Rash: No Scarring: No Scarring: No Maceration: Yes Maceration: Yes N/A Periwound Skin Moisture: Dry/Scaly: No Dry/Scaly: No Atrophie Blanche: No Atrophie Blanche: No N/A Periwound Skin Color: Cyanosis: No Cyanosis: No Ecchymosis: No Ecchymosis: No Erythema: No Erythema: No Hemosiderin Staining: No Hemosiderin Staining: No Mottled: No Mottled: No Pallor: No Pallor: No Rubor: No Rubor: No No Abnormality N/A N/A Temperature: N/A Debridement N/A Procedures Performed: Treatment Notes Wound #3 (Amputation Site - Toe) Wound Laterality: Right Cleanser Vashe 5.8 (oz) Discharge Instruction: Cleanse the wound with Vashe prior to applying a clean dressing using gauze sponges, not tissue or cotton balls. Peri-Wound Care Topical Skintegrity Hydrogel 4 (oz) Discharge Instruction: Apply hydrogel as directed Primary Dressing Endoform 2x2 in Discharge Instruction: Moisten with saline Secondary Dressing Woven Gauze Sponge, Non-Sterile 4x4 in Discharge Instruction: Apply over primary dressing as directed. Secured With Paper Tape, 2x10 (in/yd) Discharge Instruction: Secure dressing with tape as directed. [name] [name] [name] [name] ([id number]) [id number] Page 4 of 10 Compression Wrap Compression Stockings Add-Ons Wound #4 (Foot) Wound Laterality: Right, Lateral Cleanser Soap and Water Discharge Instruction: May shower and wash wound with dial antibacterial soap and water prior to dressing change. Wound Cleanser Discharge Instruction: Cleanse the wound with wound cleanser prior to applying a clean dressing using gauze sponges, not tissue or cotton  balls. Peri-Wound Care Skin Prep Discharge Instruction: Use skin prep as directed Topical Primary Dressing Maxorb Extra Ag+ Alginate Dressing, 2x2 (in/in) Discharge Instruction: Apply over the santyl as instructed Santyl Ointment Discharge Instruction: Apply nickel thick amount to wound bed as instructed Secondary Dressing Optifoam Non-Adhesive Dressing, 4x4 in Discharge Instruction: Apply foam donut. Woven Gauze Sponges 2x2 in Discharge Instruction: Apply over primary dressing as directed. Secured With Conforming Stretch Gauze Bandage, Sterile 2x75 (in/in) Discharge Instruction: Secure with stretch gauze as directed. 56M Medipore H Soft Cloth Surgical T ape, 4 x 10 (in/yd) Discharge Instruction: Secure with tape as directed. Compression Wrap Compression Stockings Add-Ons Electronic Signature(s) Signed: 01/16/2023 4:37:17 PM By: [name] MD Entered By: [name] on 01/16/2023 12:56:01 -------------------------------------------------------------------------------- Multi-Disciplinary Care Plan Details Patient Name: Date of Service: [name] [name] 01/16/2023 2:45 PM Medical Record Number: [id number] Patient Account Number: [id number] Date of Birth/Sex: Treating RN: 1933/06/06 (89 y.o. [name] [name] Primary Care Yaret Hush: [name] Other Clinician: Referring Yolette Hastings: Treating Modesta Sammons/Extender: [name] in  Treatment: [address] [place], [place] ([id number]) [id number] Page 5 of 10 Nursing Diagnoses: Impaired tissue integrity Knowledge deficit related to ulceration/compromised skin integrity Goals: Patient will have a decrease in wound volume by X% from date: (specify in notes) Date Initiated: 07/04/2022 Target Resolution Date: 02/04/2023 Goal Status: Active Patient/caregiver will verbalize understanding of skin care regimen Date Initiated: 07/04/2022 Target  Resolution Date: 02/04/2023 Goal Status: Active Ulcer/skin breakdown will have a volume reduction of 30% by week 4 Date Initiated: 07/04/2022 Date Inactivated: 10/27/2022 Target Resolution Date: 11/05/2022 Goal Status: Met Ulcer/skin breakdown will have a volume reduction of 50% by week 8 Date Initiated: 07/04/2022 Date Inactivated: 10/27/2022 Target Resolution Date: 11/05/2022 Goal Status: Met Interventions: Assess patient/caregiver ability to obtain necessary supplies Assess patient/caregiver ability to perform ulcer/skin care regimen upon admission and as needed Assess ulceration(s) every visit Notes: Electronic Signature(s) Signed: 01/16/2023 4:28:03 PM By: [name] Stall RN, BSN Entered By: [name] [name] on 01/16/2023 12:24:57 -------------------------------------------------------------------------------- Pain Assessment Details Patient Name: Date of Service: [name] [name] 01/16/2023 2:45 PM Medical Record Number: [id number] Patient Account Number: [id number] Date of Birth/Sex: Treating RN: 07/29/1933 (89 y.o. M) Primary Care Bradden Tadros: [name] Other Clinician: Referring Azelyn Batie: Treating Sher Shampine/Extender: [name] in Treatment: 28 Active Problems Location of Pain Severity and Description of Pain Patient Has Paino No Site Locations Duration of the Pain. Constant / Intermittento Constant Rate the pain. Worst Pain Level: 10 Pain Management and Medication Current Pain Management: How does your wound impact your activities of daily livingo HobbiesEthon [name] [name] [name] ([id number]) [id number] Page 6 of 10 Notes Hurts while walking or putting pressure on foot Electronic Signature(s) Signed: 01/18/2023 4:47:30 PM By: [name] [name] Entered By: [name] [name] on 01/16/2023 12:10:56 -------------------------------------------------------------------------------- Patient/Caregiver Education  Details Patient Name: Date of Service: [name] [name] 11/11/2024andnbsp2:45 PM Medical Record Number: [id number] Patient Account Number: [id number] Date of Birth/Gender: Treating RN: May 03, 1933 (89 y.o. [name] [name] Primary Care Physician: [name] Other Clinician: Referring Physician: Treating Physician/Extender: [name] in Treatment: 28 Education Assessment Education Provided To: Patient Education Topics Provided Wound/Skin Impairment: Handouts: Caring for Your Ulcer Methods: Explain/Verbal Responses: Reinforcements needed Electronic Signature(s) Signed: 01/16/2023 4:28:03 PM By: [name] Stall RN, BSN Entered By: [name] [name] on 01/16/2023 12:25:15 -------------------------------------------------------------------------------- Wound Assessment Details Patient Name: Date of Service: [name] [name] 01/16/2023 2:45 PM Medical Record Number: [id number] Patient Account Number: [id number] Date of Birth/Sex: Treating RN: 1933/10/15 (89 y.o. M) Primary Care Rashan Patient: [name] Other Clinician: Referring Edsel Shives: Treating Isaura Schiller/Extender: [name] in Treatment: 28 Wound Status Wound Number: 3 Primary Diabetic Wound/Ulcer of the Lower Extremity Etiology: Wound Location: Right Amputation Site - Toe Wound Open Wounding Event: Gradually Appeared Status: Date Acquired: 04/07/2022 Comorbid Cataracts, Anemia, Asthma, Chronic Obstructive Pulmonary Weeks Of Treatment: 28 History: Disease (COPD), Peripheral Arterial Disease, Type II Diabetes, Clustered Wound: No Gout, Osteoarthritis, Neuropathy Photos [name] [name] [name] [name] ([id number]) [id number] Page 7 of 10 Wound Measurements Length: (cm) 0.5 Width: (cm) 0.2 Depth: (cm) 0.1 Area: (cm) 0.079 Volume: (cm) 0.008 % Reduction in Area: 97.5% % Reduction in Volume: 99.2% Epithelialization: Medium (34-66%) Tunneling:  No Undermining: No Wound Description Classification: Grade 3 Wound Margin: Epibole Exudate Amount: Medium Exudate Type: Serosanguineous Exudate Color: red, brown Foul Odor After Cleansing: No Slough/Fibrino No Wound Bed Granulation Amount: Small (1-33%) Exposed Structure Granulation Quality: Pink Fascia Exposed: No Necrotic Amount: Large (67-100%) Fat Layer (Subcutaneous Tissue) Exposed: Yes Necrotic Quality: Adherent  Slough Tendon Exposed: No Muscle Exposed: No Joint Exposed: No Bone Exposed: No Periwound Skin Texture Texture Color No Abnormalities Noted: No No Abnormalities Noted: No Callus: No Atrophie Blanche: No Crepitus: No Cyanosis: No Excoriation: No Ecchymosis: No Induration: No Erythema: No Rash: No Hemosiderin Staining: No Scarring: No Mottled: No Pallor: No Moisture Rubor: No No Abnormalities Noted: No Dry / Scaly: No Temperature / Pain Maceration: Yes Temperature: No Abnormality Treatment Notes Wound #3 (Amputation Site - Toe) Wound Laterality: Right Cleanser Vashe 5.8 (oz) Discharge Instruction: Cleanse the wound with Vashe prior to applying a clean dressing using gauze sponges, not tissue or cotton balls. Peri-Wound Care Topical Skintegrity Hydrogel 4 (oz) Discharge Instruction: Apply hydrogel as directed Primary Dressing Endoform 2x2 in Discharge Instruction: Moisten with saline Secondary Dressing Woven Gauze Sponge, Non-Sterile 4x4 in Discharge Instruction: Apply over primary dressing as directed. Secured With Paper Tape, 2x10 (in/yd) [name] [name] [name] [name] ([id number]) [phone].pdf Page 8 of 10 Discharge Instruction: Secure dressing with tape as directed. Compression Wrap Compression Stockings Add-Ons Electronic Signature(s) Signed: 01/18/2023 4:47:30 PM By: [name] [name] Entered By: [name] [name] on 01/16/2023 12:14:12 -------------------------------------------------------------------------------- Wound  Assessment Details Patient Name: Date of Service: [name] [name] 01/16/2023 2:45 PM Medical Record Number: [id number] Patient Account Number: [id number] Date of Birth/Sex: Treating RN: [birth year] (89 y.o. M) Primary Care Jema Deegan: [name] Other Clinician: Referring Romie Tay: Treating Laporscha Linehan/Extender: [name] in Treatment: 28 Wound Status Wound Number: 4 Primary Diabetic Wound/Ulcer of the Lower Extremity Etiology: Wound Location: Right, Lateral Foot Secondary Pressure Ulcer Wounding Event: Shear/Friction Etiology: Date Acquired: 11/21/2022 Wound Open Weeks Of Treatment: 6 Status: Clustered Wound: No Comorbid Cataracts, Anemia, Asthma, Chronic Obstructive Pulmonary History: Disease (COPD), Peripheral Arterial Disease, Type II Diabetes, Gout, Osteoarthritis, Neuropathy Photos Wound Measurements Length: (cm) 3 Width: (cm) 2.7 Depth: (cm) 0.1 Area: (cm) 6.362 Volume: (cm) 0.636 % Reduction in Area: -22.7% % Reduction in Volume: -22.8% Epithelialization: Small (1-33%) Tunneling: No Undermining: No Wound Description Classification: Grade 1 Wound Margin: Distinct, outline attached Exudate Amount: Medium Exudate Type: Serosanguineous Exudate Color: red, brown Foul Odor After Cleansing: No Slough/Fibrino No Wound Bed Granulation Amount: Small (1-33%) Exposed Structure Necrotic Amount: Large (67-100%) Fascia Exposed: No Necrotic Quality: Eschar, Adherent Slough Fat Layer (Subcutaneous Tissue) Exposed: Yes Tendon Exposed: No Muscle Exposed: No Joint Exposed: No [name] [name] [name] ([id number]) [id number] Page 9 of 10 Bone Exposed: No Periwound Skin Texture Texture Color No Abnormalities Noted: No No Abnormalities Noted: No Callus: No Atrophie Blanche: No Crepitus: No Cyanosis: No Excoriation: No Ecchymosis: No Induration: No Erythema: No Rash: No Hemosiderin Staining: No Scarring:  No Mottled: No Pallor: No Moisture Rubor: No No Abnormalities Noted: No Dry / Scaly: No Maceration: Yes Treatment Notes Wound #4 (Foot) Wound Laterality: Right, Lateral Cleanser Soap and Water Discharge Instruction: May shower and wash wound with dial antibacterial soap and water prior to dressing change. Wound Cleanser Discharge Instruction: Cleanse the wound with wound cleanser prior to applying a clean dressing using gauze sponges, not tissue or cotton balls. Peri-Wound Care Skin Prep Discharge Instruction: Use skin prep as directed Topical Primary Dressing Maxorb Extra Ag+ Alginate Dressing, 2x2 (in/in) Discharge Instruction: Apply over the santyl as instructed Santyl Ointment Discharge Instruction: Apply nickel thick amount to wound bed as instructed Secondary Dressing Optifoam Non-Adhesive Dressing, 4x4 in Discharge Instruction: Apply foam donut. Woven Gauze Sponges 2x2 in Discharge Instruction: Apply over primary dressing as directed. Secured With Conforming Stretch Gauze Bandage, Sterile 2x75 (in/in) Discharge Instruction: Secure with stretch  gauze as directed. 34M Medipore H Soft Cloth Surgical T ape, 4 x 10 (in/yd) Discharge Instruction: Secure with tape as directed. Compression Wrap Compression Stockings Add-Ons Electronic Signature(s) Signed: 01/18/2023 4:47:30 PM By: [name] [name] Entered By: [name] [name] on 01/16/2023 12:15:11 -------------------------------------------------------------------------------- Vitals Details Patient Name: Date of Service: [name] [name] 01/16/2023 2:45 PM Medical Record Number: [id number] Patient Account Number: [id number] Date of Birth/Sex: Treating RN: 1933-08-27 (89 y.o. M) Primary Care Antonious Omahoney: [name] Other Clinician: CICERO, WICKES ([id number]) [id number] Page 10 of 10 Referring Ferrel Simington: Treating Raevon Broom/Extender: [name] in Treatment:  28 Vital Signs Time Taken: 15:09 Reference Range: 80 - 120 mg / dl Height (in): 71 Weight (lbs): 170 Body Mass Index (BMI): 23.7 Notes VS done in [facility] Electronic Signature(s) Signed: 01/18/2023 4:47:30 PM By: [name] [name] Entered By: [name] [name] on 01/16/2023 12:10:08

## 2023-01-18 NOTE — Progress Notes (Signed)
CROFOOT, Luisalberto ([id number]) [id number] Page 1 of 1 Visit Report for 01/17/2023 SuperBill Details Patient Name: Date of Service: [name] [name] 01/17/2023 Medical Record Number: [id number] Patient Account Number: [id number] Date of Birth/Sex: Treating RN: 04-11-1933 (89 y.o. [name] Primary Care Provider: [name] Other Clinician: Haywood Pao Referring Provider: Treating Provider/Extender: [name] in Treatment: 28 Diagnosis Coding ICD-10 Codes Code Description [phone] Non-pressure chronic ulcer of other part of right foot with fat layer exposed L97.514 Non-pressure chronic ulcer of other part of right foot with necrosis of bone E11.621 Type 2 diabetes mellitus with foot ulcer I70.235 Atherosclerosis of native arteries of right leg with ulceration of other part of foot Facility Procedures CPT4 Code Description Modifier Quantity [id number]-(Facility Use Only) HBOT full body chamber, , 4 ICD-10 Diagnosis Description E11.621 Type 2 diabetes mellitus with foot ulcer L97.512 Non-pressure chronic ulcer of other part of right foot with fat layer exposed L97.514 Non-pressure chronic ulcer of other part of right foot with necrosis of bone I70.235 Atherosclerosis of native arteries of right leg with ulceration of other part of foot Physician Procedures Quantity CPT4 Code Description Modifier [id number] - WC PHYS HYPERBARIC OXYGEN THERAPY 1 ICD-10 Diagnosis Description E11.621 Type 2 diabetes mellitus with foot ulcer 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.512 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.235 Atherosclerosis of native arteries of right leg with ulceration of other part of foot Electronic Signature(s) Signed: 01/17/2023 4:23:49 PM By: Haywood Pao CHT EMT BS , , Signed:  01/18/2023 10:44:47 AM By: [name] MD Entered By: Haywood Pao on 01/17/2023 13:23:49

## 2023-01-18 NOTE — Progress Notes (Signed)
[name], [name] ([id number]) [id number] Page 1 of 3 Visit Report for 01/18/2023 HBO Details Patient Name: Date of Service: [name] [name] 01/18/2023 12:00 PM Medical Record Number: [id number] Patient Account Number: [id number] Date of Birth/Sex: Treating RN: [birth year] (89 y.o. [name], [name] Primary Care [name]: [name] Other Clinician: [name] Referring [name]: Treating [name]/Extender: [name] in Treatment: 28 HBO Treatment Course Details Treatment Course Number: 1 Ordering [name]: [name] T Treatments Ordered: otal 40 HBO Treatment Start Date: 01/09/2023 HBO Indication: Diabetic Ulcer(s) of the Lower Extremity Standard/Conservative Wound Care tried and failed greater than or equal to 30 days Wound #3 Right Amputation Site - T oe HBO Treatment Details Treatment Number: 8 Patient Type: Outpatient Chamber Type: Monoplace Chamber Serial #: [id number] Treatment Protocol: 2.0 ATA with 90 minutes oxygen, and no air breaks Treatment Details Compression Rate Down: 1.5 psi / minute De-Compression Rate Up: 1.5 psi / minute Air breaks and breathing Decompress Decompress Compress Tx Pressure Begins Reached periods Begins Ends (leave unused spaces blank) Chamber Pressure (ATA 1 2 ------2 1 ) Clock Time (24 hr) 12:42 12:54 - - - - - - 14:24 14:35 Treatment Length: 113 (minutes) Treatment Segments: 4 Vital Signs Capillary Blood Glucose Reference Range: 80 - 120 mg / dl HBO Diabetic Blood Glucose Intervention Range: <131 mg/dl or >578 mg/dl Type: Time Vitals Blood Respiratory Capillary Blood Glucose Pulse Action Pulse: Temperature: Taken: Pressure: Rate: Glucose (mg/dl): Meter #: Oximetry (%) Taken: Pre 12:22 146/88 99 16 98.1 135 none per protocol Post 14:38 146/92 82 12 98.4 106 none per protocol Treatment Response Treatment Toleration: Well Treatment Completion Status:  Treatment Completed without Adverse Event Treatment Notes [name] arrived with normal vital signs. He stated that he had eaten a meal prior to arrival. He prepared for the treatment. After performing a safety check, he was placed in the chamber which was compressed with 100% oxygen at a rate of 1.5 psi/min after confirming normal ear equalization. He tolerated the treatment and subsequent decompression at the rate of 1.5 psi/min. He denied any issues with ear equalization and/or pain associated with barotrauma. Post-treatment vital signs were within normal range. He was stable upon discharge. Additional Procedure Documentation Tissue Sevierity: Necrosis of bone [name] Notes No concerns with treatment given Physician HBO Attestation: I certify that I supervised this HBO treatment in accordance with Medicare guidelines. A trained emergency response team is readily available per Yes [name], [name] ([id number]) [id number] Page 2 of 3 hospital policies and procedures. Continue HBOT as ordered. Yes Electronic Signature(s) Signed: 01/19/2023 4:06:32 PM By: [name] Najjar MD Previous Signature: 01/18/2023 3:09:30 PM Version By: [name] CHT EMT BS , , Entered By: [name] on 01/18/2023 12:35:07 -------------------------------------------------------------------------------- HBO Safety Checklist Details Patient Name: Date of Service: [name] [name] 01/18/2023 12:00 PM Medical Record Number: [id number] Patient Account Number: [id number] Date of Birth/Sex: Treating RN: May 11, 1933 (89 y.o. [name] Primary Care [name]: [name] Other Clinician: [name] Referring Maleta Pacha: Treating [name]/Extender: [name] in Treatment: 28 HBO Safety Checklist Items Safety Checklist Consent Form Signed Patient voided / foley secured and emptied When did you last eato 0930 - [employer] Last dose of injectable or oral agent 0800 Jardiance Ostomy pouch emptied and vented if applicable NA All implantable devices assessed, documented and approved NA Intravenous access site secured and place NA Valuables secured Linens and cotton and cotton/polyester blend (less than 51% polyester) Personal oil-based products /  skin lotions / body lotions removed Wigs or hairpieces removed NA Smoking or tobacco materials removed NA Books / newspapers / magazines / loose paper removed Cologne, aftershave, perfume and deodorant removed Jewelry removed (may wrap wedding band) Make-up removed NA Hair care products removed Battery operated devices (external) removed Heating patches and chemical warmers removed Titanium eyewear removed Nail polish cured greater than 10 hours NA Casting material cured greater than 10 hours NA Hearing aids removed removed Loose dentures or partials removed dentures removed Prosthetics have been removed NA Patient demonstrates correct use of air break device (if applicable) Patient concerns have been addressed Patient grounding bracelet on and cord attached to chamber Specifics for Inpatients (complete in addition to above) Medication sheet sent with patient NA Intravenous medications needed or due during therapy sent with patient NA Drainage tubes (e.g. nasogastric tube or chest tube secured and vented) NA Endotracheal or Tracheotomy tube secured NA Cuff deflated of air and inflated with saline NA Airway suctioned NA Notes Paper version used prior to treatment start. Electronic Signature(s) Signed: 01/18/2023 1:32:26 PM By: [name] CHT EMT BS , , Previous Signature: 01/18/2023 1:30:15 PM Version By: [name] CHT EMT BS , , [place], [name] ([id number]) [id number] Page 3 of 3 Previous Signature: 01/18/2023 1:30:15 PM Version By: [name] CHT EMT BS , , Entered By: [name]  on 01/18/2023 10:32:26

## 2023-01-18 NOTE — Progress Notes (Addendum)
DELANCY, Roniel ([id number]) [id number] Page 1 of 2 Visit Report for 01/18/2023 Arrival Information Details Patient Name: Date of Service: [name] 01/18/2023 12:00 PM Medical Record Number: [id number] Patient Account Number: [id number] Date of Birth/Sex: Treating RN: Jul 14, 1933 (89 y.o. [name] Primary Care Sierrah Luevano: [name] Other Clinician: Haywood Pao Referring Camiah Humm: Treating Fotios Amos/Extender: [name] in Treatment: 28 Visit Information History Since Last Visit All ordered tests and consults were completed: Yes Patient Arrived: Ambulatory Added or deleted any medications: No Arrival Time: 12:22 Any new allergies or adverse reactions: No Accompanied By: self Had a fall or experienced change in No Transfer Assistance: None activities of daily living that may affect Patient Identification Verified: Yes risk of falls: Secondary Verification Process Completed: Yes Signs or symptoms of abuse/neglect since last visito No Patient Requires Transmission-Based Precautions: No Hospitalized since last visit: No Patient Has Alerts: Yes Implantable device outside of the clinic excluding No Patient Alerts: Patient on Blood Thinner cellular tissue based products placed in the center since last visit: Pain Present Now: No Electronic Signature(s) Signed: 01/18/2023 1:28:40 PM By: Haywood Pao CHT EMT BS , , Entered By: Haywood Pao on 01/18/2023 10:28:39 -------------------------------------------------------------------------------- Encounter Discharge Information Details Patient Name: Date of Service: [name] 01/18/2023 12:00 PM Medical Record Number: [id number] Patient Account Number: [id number] Date of Birth/Sex: Treating RN: [birth year] (89 y.o. [name] Primary Care Kierah Goatley: [name] Other Clinician: Haywood Pao Referring  Murphy Duzan: Treating Darris Carachure/Extender: [name] in Treatment: 28 Encounter Discharge Information Items Discharge Condition: Stable Ambulatory Status: Ambulatory Discharge Destination: Home Transportation: Private Auto Accompanied By: self Schedule Follow-up Appointment: No Clinical Summary of Care: Electronic Signature(s) Signed: 01/18/2023 3:13:45 PM By: Haywood Pao CHT EMT BS , , Entered By: Haywood Pao on 01/18/2023 12:13:45 Tierney, Jamarea ([id number]) [id number] Page 2 of 2 -------------------------------------------------------------------------------- Vitals Details Patient Name: Date of Service: [name] 01/18/2023 12:00 PM Medical Record Number: [id number] Patient Account Number: [id number] Date of Birth/Sex: Treating RN: June 24, 1933 (89 y.o. [name] Primary Care Maybree Riling: [name] Other Clinician: Haywood Pao Referring Khloe Hunkele: Treating Myka Hitz/Extender: [name] in Treatment: 28 Vital Signs Time Taken: 12:22 Temperature (F): 98.1 Height (in): 71 Pulse (bpm): 99 Weight (lbs): 170 Respiratory Rate (breaths/min): 16 Body Mass Index (BMI): 23.7 Blood Pressure (mmHg): 146/88 Reference Range: 80 - 120 mg / dl Electronic Signature(s) Signed: 01/18/2023 1:29:02 PM By: Haywood Pao CHT EMT BS , , Entered By: Haywood Pao on 01/18/2023 10:29:02

## 2023-01-19 ENCOUNTER — Encounter (HOSPITAL_BASED_OUTPATIENT_CLINIC_OR_DEPARTMENT_OTHER): Payer: PPO | Admitting: Internal Medicine

## 2023-01-19 DIAGNOSIS — E11621 Type 2 diabetes mellitus with foot ulcer: Secondary | ICD-10-CM | POA: Diagnosis not present

## 2023-01-19 LAB — GLUCOSE, CAPILLARY
Glucose-Capillary: 106 mg/dL — ABNORMAL HIGH (ref 70–99)
Glucose-Capillary: 154 mg/dL — ABNORMAL HIGH (ref 70–99)
Glucose-Capillary: 117 mg/dL — ABNORMAL HIGH (ref 70–99)

## 2023-01-19 NOTE — Progress Notes (Signed)
MASHNI, Travers ([id number]) [id number] Page 1 of 2 Visit Report for 01/19/2023 Arrival Information Details Patient Name: Date of Service: [name] 01/19/2023 12:00 PM Medical Record Number: [id number] Patient Account Number: [id number] Date of Birth/Sex: Treating RN: 1933-12-20 (89 y.o. [name] Primary Care Natalea Sutliff: [name] Other Clinician: [name] Referring Chelle Cayton: Treating Eunique Balik/Extender: [name] in Treatment: 28 Visit Information History Since Last Visit All ordered tests and consults were completed: Yes Patient Arrived: Ambulatory Added or deleted any medications: No Arrival Time: 14:46 Any new allergies or adverse reactions: No Accompanied By: None Had a fall or experienced change in No Transfer Assistance: None activities of daily living that may affect Patient Identification Verified: Yes risk of falls: Secondary Verification Process Completed: Yes Signs or symptoms of abuse/neglect since last visito No Patient Requires Transmission-Based Precautions: No Hospitalized since last visit: No Patient Has Alerts: Yes Implantable device outside of the clinic excluding No Patient Alerts: Patient on Blood Thinner cellular tissue based products placed in the center since last visit: Pain Present Now: No Electronic Signature(s) Signed: 01/19/2023 2:48:43 PM By: [name] Entered By: [name] on 01/19/2023 11:48:43 -------------------------------------------------------------------------------- Encounter Discharge Information Details Patient Name: Date of Service: [name] 01/19/2023 12:00 PM Medical Record Number: [id number] Patient Account Number: [id number] Date of Birth/Sex: Treating RN: 1934/02/22 (89 y.o. [name] Primary Care Murle Otting: [name] Other Clinician: [name] Referring Jaycelyn Orrison: Treating  Anaka Beazer/Extender: [name] in Treatment: 28 Encounter Discharge Information Items Discharge Condition: Stable Ambulatory Status: Ambulatory Discharge Destination: Home Transportation: Private Auto Accompanied By: daughter Schedule Follow-up Appointment: Yes Clinical Summary of Care: Electronic Signature(s) Signed: 01/19/2023 2:52:55 PM By: [name] Entered By: [name] on 01/19/2023 11:52:55 Hu, Regis ([id number]) [id number] Page 2 of 2 -------------------------------------------------------------------------------- Vitals Details Patient Name: Date of Service: [name] 01/19/2023 12:00 PM Medical Record Number: [id number] Patient Account Number: [id number] Date of Birth/Sex: Treating RN: 1934/01/22 (89 y.o. [name] Primary Care Sowmya Partridge: [name] Other Clinician: [name] Referring Amillion Macchia: Treating Aurilla Coulibaly/Extender: [name] in Treatment: 28 Vital Signs Time Taken: 12:14 Temperature (F): 97.1 Height (in): 71 Pulse (bpm): 107 Weight (lbs): 170 Respiratory Rate (breaths/min): 18 Body Mass Index (BMI): 23.7 Blood Pressure (mmHg): 138/78 Capillary Blood Glucose (mg/dl): 433 Reference Range: 80 - 120 mg / dl Electronic Signature(s) Signed: 01/19/2023 2:49:19 PM By: [name] Entered By: [name] on 01/19/2023 11:49:19

## 2023-01-19 NOTE — Progress Notes (Addendum)
[name], [name] ([id number]) [id number] Page 1 of 2 Visit Report for 01/19/2023 HBO Details Patient Name: Date of Service: [name] 01/19/2023 12:00 PM Medical Record Number: [id number] Patient Account Number: [id number] Date of Birth/Sex: Treating RN: [birth year] (89 y.o. [name] Primary Care Lisel Siegrist: [name] Other Clinician: [name] Referring Gael Delude: Treating Nikan Ellingson/Extender: [name] in Treatment: 28 HBO Treatment Course Details Treatment Course Number: 1 Ordering Indira Sorenson: [name] T Treatments Ordered: otal 40 HBO Treatment Start Date: 01/09/2023 HBO Indication: Diabetic Ulcer(s) of the Lower Extremity Standard/Conservative Wound Care tried and failed greater than or equal to 30 days Wound #3 Right Amputation Site - T oe HBO Treatment Details Treatment Number: 9 Patient Type: Outpatient Chamber Type: Monoplace Chamber Serial #: [id number] Treatment Protocol: 2.0 ATA with 90 minutes oxygen, and no air breaks Treatment Details Compression Rate Down: 2.0 psi / minute De-Compression Rate Up: 2.0 psi / minute Air breaks and breathing Decompress Decompress Compress Tx Pressure Begins Reached periods Begins Ends (leave unused spaces blank) Chamber Pressure (ATA 1 2 ------2 1 ) Clock Time (24 hr) 12:37 12:46 - - - - - - 14:16 14:22 Treatment Length: 105 (minutes) Treatment Segments: 3 Vital Signs Capillary Blood Glucose Reference Range: 80 - 120 mg / dl HBO Diabetic Blood Glucose Intervention Range: <131 mg/dl or >782 mg/dl Time Vitals Blood Respiratory Capillary Blood Glucose Pulse Action Type: Pulse: Temperature: Taken: Pressure: Rate: Glucose (mg/dl): Meter #: Oximetry (%) Taken: Pre 12:14 138/78 107 18 97.1 154 Post 14:30 142/91 86 18 97.7 117 Treatment Response Treatment Toleration: Well Treatment Completion Status: Treatment Completed without Adverse  Event Additional Procedure Documentation Tissue Sevierity: Necrosis of bone Esco Joslyn Notes No concern with treatment given Physician HBO Attestation: I certify that I supervised this HBO treatment in accordance with Medicare guidelines. A trained emergency response team is readily available per Yes hospital policies and procedures. Continue HBOT as ordered. Yes Electronic Signature(s) Halbur, Frederich ([id number]) [id number] Page 2 of 2 Signed: 01/19/2023 4:06:32 PM By: [name] MD Previous Signature: 01/19/2023 2:51:44 PM Version By: [name] Entered By: [name] on 01/19/2023 12:35:24 -------------------------------------------------------------------------------- HBO Safety Checklist Details Patient Name: Date of Service: [name] 01/19/2023 12:00 PM Medical Record Number: [id number] Patient Account Number: [id number] Date of Birth/Sex: Treating RN: 1933/03/22 (89 y.o. [name] Primary Care Avaleen Brownley: [name] Other Clinician: [name] Referring Bronte Kropf: Treating Tuwanna Krausz/Extender: [name] in Treatment: 28 HBO Safety Checklist Items Safety Checklist Consent Form Signed Patient voided / foley secured and emptied When did you last eato 1000 Last dose of injectable or oral agent 0800 Ostomy pouch emptied and vented if applicable NA All implantable devices assessed, documented and approved NA Intravenous access site secured and place NA Valuables secured Linens and cotton and cotton/polyester blend (less than 51% polyester) Personal oil-based products / skin lotions / body lotions removed Wigs or hairpieces removed NA Smoking or tobacco materials removed Books / newspapers / magazines / loose paper removed Cologne, aftershave, perfume and deodorant removed Jewelry removed (may wrap wedding band) Make-up removed NA Hair care products removed Battery  operated devices (external) removed Heating patches and chemical warmers removed Titanium eyewear removed NA Nail polish cured greater than 10 hours NA Casting material cured greater than 10 hours NA Hearing aids removed removed Loose dentures or partials removed removed by patient Prosthetics have been removed NA Patient demonstrates correct use of air break device (if applicable) Patient concerns  have been addressed Patient grounding bracelet on and cord attached to chamber Specifics for Inpatients (complete in addition to above) Medication sheet sent with patient NA Intravenous medications needed or due during therapy sent with patient NA Drainage tubes (e.g. nasogastric tube or chest tube secured and vented) NA Endotracheal or Tracheotomy tube secured NA Cuff deflated of air and inflated with saline NA Airway suctioned NA Notes The safety checklist was done before the treatment was started. Electronic Signature(s) Signed: 01/19/2023 2:50:18 PM By: [name] Entered By: [name] on 01/19/2023 11:50:17

## 2023-01-19 NOTE — Progress Notes (Signed)
SHIPPEE, Theodor ([id number]) [id number] Page 1 of 1 Visit Report for 01/18/2023 SuperBill Details Patient Name: Date of Service: [name] 01/18/2023 Medical Record Number: [id number] Patient Account Number: [id number] Date of Birth/Sex: Treating RN: 11/10/1933 (89 y.o. 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 E11.621 Type 2 diabetes mellitus with foot ulcer I70.235 Atherosclerosis of native arteries of right leg with ulceration of other part of foot Facility Procedures CPT4 Code Description Modifier Quantity [id number] G0277-(Facility Use Only) HBOT full body chamber, , 4 ICD-10 Diagnosis Description E11.621 Type 2 diabetes mellitus with foot ulcer 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.512 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.235 Atherosclerosis of native arteries of right leg with ulceration of other part of foot Physician Procedures Quantity CPT4 Code Description Modifier [id number] - WC PHYS HYPERBARIC OXYGEN THERAPY 1 ICD-10 Diagnosis Description E11.621 Type 2 diabetes mellitus with foot ulcer 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

## 2023-01-19 NOTE — Progress Notes (Signed)
[name], [name] ([id number]) [id number] Page 1 of 2 Visit Report for 01/19/2023 Problem List Details Patient Name: Date of Service: [name] 01/19/2023 12:00 PM Medical Record Number: [id number] Patient Account Number: [id number] Date of Birth/Sex: Treating RN: 10/25/1933 (89 y.o. [name] Primary Care Provider: [name] Other Clinician: [name] Referring Provider: Treating Provider/Extender: [name] in Treatment: 28 Active Problems ICD-10 Encounter Code Description Active Date MDM Diagnosis L97.512 Non-pressure chronic ulcer of other part of right foot with fat 07/04/2022 No Yes layer exposed L97.514 Non-pressure chronic ulcer of other part of right foot with 12/26/2022 No Yes necrosis of bone E11.621 Type 2 diabetes mellitus with foot ulcer 07/04/2022 No Yes I70.235 Atherosclerosis of native arteries of right leg with ulceration of 07/04/2022 No Yes other part of foot Inactive Problems Resolved Problems Electronic Signature(s) Signed: 01/19/2023 2:52:27 PM By: [name] Signed: 01/19/2023 4:06:32 PM By: [name] MD Entered By: [name] on 01/19/2023 11:52:27 -------------------------------------------------------------------------------- SuperBill Details Patient Name: Date of Service: [name] 01/19/2023 Medical Record Number: [id number] Patient Account Number: [id number] Date of Birth/Sex: Treating RN: 04-Jul-1933 (89 y.o. [name] Primary Care Provider: [name] Other Clinician: [name] Referring Provider: Treating Provider/Extender: [name] in Treatment: 28 Diagnosis Coding ICD-10 Codes Code Description [name], [name] ([id number]) [id number] Page 2 of 2 [phone] Non-pressure chronic ulcer of other part of right foot with fat layer exposed L97.514  Non-pressure chronic ulcer of other part of right foot with necrosis of bone E11.621 Type 2 diabetes mellitus with foot ulcer I70.235 Atherosclerosis of native arteries of right leg with ulceration of other part of foot Facility Procedures : CPT4 Code Description: [id number] G0277-(Facility Use Only) HBOT full body chamber, , ICD-10 Diagnosis Description E11.621 Type 2 diabetes mellitus with foot ulcer L97.514 Non-pressure chronic ulcer of other part of right foot with n L97.512  Non-pressure chronic ulcer of other part of right foot with f I70.235 Atherosclerosis of native arteries of right leg with ulcerati Modifier: ecrosis of bone at layer exposed on of other part Quantity: 3 of foot Physician Procedures : CPT4 Code Description Modifier [id number] 32951 - WC PHYS HYPERBARIC OXYGEN THERAPY ICD-10 Diagnosis Description E11.621 Type 2 diabetes mellitus with foot ulcer 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.512  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.235 Atherosclerosis of native arteries of right leg with ulceration of other part Quantity: 1 of foot Electronic Signature(s) Signed: 01/19/2023 2:52:22 PM By: [name] Signed: 01/19/2023 4:06:32 PM By: [name] MD Entered By: [name] on 01/19/2023 11:52:21

## 2023-01-20 ENCOUNTER — Encounter (HOSPITAL_BASED_OUTPATIENT_CLINIC_OR_DEPARTMENT_OTHER): Payer: PPO | Admitting: General Surgery

## 2023-01-20 DIAGNOSIS — E11621 Type 2 diabetes mellitus with foot ulcer: Secondary | ICD-10-CM | POA: Diagnosis not present

## 2023-01-20 LAB — GLUCOSE, CAPILLARY
Glucose-Capillary: 214 mg/dL — ABNORMAL HIGH (ref 70–99)
Glucose-Capillary: 186 mg/dL — ABNORMAL HIGH (ref 70–99)

## 2023-01-20 NOTE — Progress Notes (Signed)
[name] ([id number]) [id number] Page 1 of 2 Visit Report for 01/20/2023 Arrival Information Details Patient Name: Date of Service: [name] [name] 01/20/2023 9:30 A M Medical Record Number: [id number] Patient Account Number: [id number] Date of Birth/Sex: Treating RN: 28-Mar-1933 (89 y.o. [name] Primary Care Tafari Humiston: [name] Other Clinician: [name] Referring Stephen Baruch: Treating Myrth Dahan/Extender: Tonye Pearson in Treatment: 28 Visit Information History Since Last Visit All ordered tests and consults were completed: Yes Patient Arrived: Ambulatory Added or deleted any medications: No Arrival Time: 09:02 Any new allergies or adverse reactions: No Accompanied By: None Had a fall or experienced change in No Transfer Assistance: None activities of daily living that may affect Patient Identification Verified: Yes risk of falls: Secondary Verification Process Completed: Yes Signs or symptoms of abuse/neglect since last visito No Patient Requires Transmission-Based Precautions: No Hospitalized since last visit: No Patient Has Alerts: Yes Implantable device outside of the clinic excluding No Patient Alerts: Patient on Blood Thinner cellular tissue based products placed in the center since last visit: Pain Present Now: No Electronic Signature(s) Signed: 01/20/2023 11:37:51 AM By: [name] Entered By: [name] on 01/20/2023 08:37:51 -------------------------------------------------------------------------------- Encounter Discharge Information Details Patient Name: Date of Service: [name] [name] 01/20/2023 9:30 A M Medical Record Number: [id number] Patient Account Number: [id number] Date of Birth/Sex: Treating RN: 09/04/1933 (89 y.o. [name] Primary Care Aidenn Skellenger: [name] Other Clinician: [name] Referring Syrena Burges: Treating  Hosam Mcfetridge/Extender: Tonye Pearson in Treatment: 28 Encounter Discharge Information Items Discharge Condition: Stable Ambulatory Status: Ambulatory Discharge Destination: Home Transportation: Private Auto Accompanied By: None Schedule Follow-up Appointment: Yes Clinical Summary of Care: Electronic Signature(s) Signed: 01/20/2023 12:12:07 PM By: [name] Entered By: [name] on 01/20/2023 09:12:07 Scantlin, Aviv ([id number]) [id number] Page 2 of 2 -------------------------------------------------------------------------------- Vitals Details Patient Name: Date of Service: [name] [name] 01/20/2023 9:30 A M Medical Record Number: [id number] Patient Account Number: [id number] Date of Birth/Sex: Treating RN: Jun 23, 1933 (89 y.o. [name] Primary Care Ronie Fleeger: [name] Other Clinician: [name] Referring Shanaia Sievers: Treating Herta Hink/Extender: Tonye Pearson in Treatment: 28 Vital Signs Time Taken: 09:59 Temperature (F): 97.3 Height (in): 71 Pulse (bpm): 117 Weight (lbs): 170 Respiratory Rate (breaths/min): 16 Body Mass Index (BMI): 23.7 Blood Pressure (mmHg): 142/82 Capillary Blood Glucose (mg/dl): 573 Reference Range: 80 - 120 mg / dl Electronic Signature(s) Signed: 01/20/2023 11:39:54 AM By: [name] Entered By: [name] on 01/20/2023 08:39:54

## 2023-01-20 NOTE — Progress Notes (Signed)
RUPINSKI, Artis ([id number]) [id number] Page 1 of 2 Visit Report for 01/20/2023 Problem List Details Patient Name: Date of Service: [name] 01/20/2023 9:30 A M Medical Record Number: [id number] Patient Account Number: [id number] Date of Birth/Sex: Treating RN: [birth year] (89 y.o. 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 [name] Primary Care Provider: [name] Other Clinician: [name] Referring Provider: Treating Provider/Extender: Stacey Drain Weeks in Treatment: 28 Diagnosis Coding ICD-10 Codes Code Description JALYN, PLUMBER ([id number]) [id number] Page 2 of 2 [phone] Non-pressure chronic ulcer of other part of right foot with fat layer  exposed L97.514 Non-pressure chronic ulcer of other part of right foot with necrosis of bone E11.621 Type 2 diabetes mellitus with foot ulcer I70.235 Atherosclerosis of native arteries of right leg with ulceration of other part of foot Facility Procedures : CPT4 Code Description: [id number] G0277-(Facility Use Only) HBOT full body chamber, , ICD-10 Diagnosis Description E11.621 Type 2 diabetes mellitus with foot ulcer L97.514 Non-pressure chronic ulcer of other part of right foot with n L97.512  Non-pressure chronic ulcer of other part of right foot with f I70.235 Atherosclerosis of native arteries of right leg with ulcerati Modifier: ecrosis of bone at layer exposed on of other part Quantity: 4 of foot Physician Procedures : CPT4 Code Description Modifier [id number] 20254 - WC PHYS HYPERBARIC OXYGEN THERAPY ICD-10 Diagnosis Description E11.621 Type 2 diabetes mellitus with foot ulcer 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.512  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.235 Atherosclerosis of native arteries of right leg with ulceration of other part Quantity: 1 of foot Electronic Signature(s) Signed: 01/20/2023 12:11:28 PM By: [name] Signed: 01/20/2023 12:51:55 PM By: Duanne Guess MD FACS Entered By: [name] on 01/20/2023 09:11:27

## 2023-01-20 NOTE — Progress Notes (Addendum)
[name], [name] ([id number]) [id number] Page 1 of 2 Visit Report for 01/20/2023 HBO Details Patient Name: Date of Service: [name] 01/20/2023 9:30 A M Medical Record Number: [id number] Patient Account Number: [id number] Date of Birth/Sex: Treating RN: 08/27/1933 (89 y.o. [name], [name] Primary Care [name]: [name] Other Clinician: Karl [name] Referring [name]: Treating [name]: [name] in Treatment: 28 HBO Treatment Course Details Treatment Course Number: 1 Ordering [name]: [name] T Treatments Ordered: otal 40 HBO Treatment Start Date: 01/09/2023 HBO Indication: Diabetic Ulcer(s) of the Lower Extremity Standard/Conservative Wound Care tried and failed greater than or equal to 30 days Wound #3 Right Amputation Site - T oe HBO Treatment Details Treatment Number: 10 Patient Type: Outpatient Chamber Type: Monoplace Chamber Serial #: [id number] Treatment Protocol: 2.0 ATA with 90 minutes oxygen, and no air breaks Treatment Details Compression Rate Down: 2.0 psi / minute De-Compression Rate Up: 2.0 psi / minute Air breaks and breathing Decompress Decompress Compress Tx Pressure Begins Reached periods Begins Ends (leave unused spaces blank) Chamber Pressure (ATA 1 2 ------2 1 ) Clock Time (24 hr) 10:10 10:22 - - - - - - 11:53 12:01 Treatment Length: 111 (minutes) Treatment Segments: 4 Vital Signs Capillary Blood Glucose Reference Range: 80 - 120 mg / dl HBO Diabetic Blood Glucose Intervention Range: <131 mg/dl or >403 mg/dl Time Vitals Blood Respiratory Capillary Blood Glucose Pulse Action Type: Pulse: Temperature: Taken: Pressure: Rate: Glucose (mg/dl): Meter #: Oximetry (%) Taken: Pre 09:59 142/82 117 16 97.3 214 Post 12:06 146/90 86 18 97.2 186 Treatment Response Treatment Toleration: Well Treatment Completion Status: Treatment Completed without Adverse  Event Additional Procedure Documentation Tissue Sevierity: Necrosis of bone Physician HBO Attestation: I certify that I supervised this HBO treatment in accordance with Medicare guidelines. A trained emergency response team is readily available per Yes hospital policies and procedures. Continue HBOT as ordered. Yes Electronic Signature(s) Signed: 01/20/2023 12:57:35 PM By: [name] Guess MD FACS Previous Signature: 01/20/2023 12:11:00 PM Version By: [name] EMT Entered By: [name] on 01/20/2023 09:57:35 [name], [name] ([id number]) [id number] Page 2 of 2 -------------------------------------------------------------------------------- HBO Safety Checklist Details Patient Name: Date of Service: [name] 01/20/2023 9:30 A M Medical Record Number: [id number] Patient Account Number: [id number] Date of Birth/Sex: Treating RN: [birth year] (89 y.o. [name], [name] Primary Care [name]: [name] Other Clinician: Karl [name] Referring [name]: Treating [name]/Extender: [name] in Treatment: 28 HBO Safety Checklist Items Safety Checklist Consent Form Signed Patient voided / foley secured and emptied When did you last eato 0800 Last dose of injectable or oral agent 0873 Ostomy pouch emptied and vented if applicable NA All implantable devices assessed, documented and approved NA Intravenous access site secured and place NA Valuables secured Linens and cotton and cotton/polyester blend (less than 51% polyester) Personal oil-based products / skin lotions / body lotions removed Wigs or hairpieces removed NA Smoking or tobacco materials removed Books / newspapers / magazines / loose paper removed Cologne, aftershave, perfume and deodorant removed Jewelry removed (may wrap wedding band) Make-up removed NA Hair care products removed Battery operated devices (external) removed Heating  patches and chemical warmers removed Titanium eyewear removed NA Nail polish cured greater than 10 hours NA Casting material cured greater than 10 hours NA Hearing aids removed removed Loose dentures or partials removed removed by patient Prosthetics have been removed NA Patient demonstrates correct use of air break device (if applicable) Patient concerns have been addressed Patient  grounding bracelet on and cord attached to chamber Specifics for Inpatients (complete in addition to above) Medication sheet sent with patient NA Intravenous medications needed or due during therapy sent with patient NA Drainage tubes (e.g. nasogastric tube or chest tube secured and vented) NA Endotracheal or Tracheotomy tube secured NA Cuff deflated of air and inflated with saline NA Airway suctioned NA Notes The safety checklist was done before the treatment was started. Electronic Signature(s) Signed: 01/20/2023 11:41:06 AM By: [name] EMT Entered By: [name] on 01/20/2023 08:41:06

## 2023-01-23 ENCOUNTER — Encounter (HOSPITAL_BASED_OUTPATIENT_CLINIC_OR_DEPARTMENT_OTHER): Payer: PPO | Admitting: Internal Medicine

## 2023-01-23 ENCOUNTER — Other Ambulatory Visit: Payer: Self-pay

## 2023-01-23 DIAGNOSIS — E11621 Type 2 diabetes mellitus with foot ulcer: Secondary | ICD-10-CM | POA: Diagnosis not present

## 2023-01-23 MED ORDER — TIOTROPIUM BROMIDE MONOHYDRATE 18 MCG IN CAPS: ORAL_CAPSULE | RESPIRATORY_TRACT | 2 refills | Status: DC

## 2023-01-23 NOTE — Progress Notes (Signed)
MALISZEWSKI, Aser ([id number]) [id number] Page 1 of 1 Visit Report for 01/23/2023 SuperBill Details Patient Name: Date of Service: [name] [name] 01/23/2023 Medical Record Number: [id number] Patient Account Number: [id number] Date of Birth/Sex: Treating RN: 07-Oct-1933 (89 y.o. 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 E11.621 Type 2 diabetes mellitus with foot ulcer I70.235 Atherosclerosis of native arteries of right leg with ulceration of other part of foot Facility Procedures CPT4 Code Description Modifier Quantity [id number] G0277-(Facility Use Only) HBOT full body chamber, , 4 ICD-10 Diagnosis Description E11.621 Type 2 diabetes mellitus with foot ulcer 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.512 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.235 Atherosclerosis of native arteries of right leg with ulceration of other part of foot Physician Procedures Quantity CPT4 Code Description Modifier [id number] 99183 - WC PHYS HYPERBARIC OXYGEN THERAPY 1 ICD-10 Diagnosis Description E11.621 Type 2 diabetes mellitus with foot ulcer 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

## 2023-01-23 NOTE — Progress Notes (Addendum)
BRANDOW, Abhishek ([id number]) [id number] Page 1 of 3 Visit Report for 01/23/2023 HBO Details Patient Name: Date of Service: [name] [name] 01/23/2023 12:00 PM Medical Record Number: [id number] Patient Account Number: [id number] Date of Birth/Sex: Treating RN: Feb 16, 1934 (89 y.o. [name] Primary Care Aleksa Collinsworth: [name] Other Clinician: Haywood Pao Referring Amabel Stmarie: Treating Ronon Ferger/Extender: [name] in Treatment: 29 HBO Treatment Course Details Treatment Course Number: 1 Ordering Jax Kentner: [name] T Treatments Ordered: otal 40 HBO Treatment Start Date: 01/09/2023 HBO Indication: Diabetic Ulcer(s) of the Lower Extremity Standard/Conservative Wound Care tried and failed greater than or equal to 30 days Wound #3 Right Amputation Site - T oe HBO Treatment Details Treatment Number: 11 Patient Type: Outpatient Chamber Type: Monoplace Chamber Serial #: [id number] Treatment Protocol: 2.0 ATA with 90 minutes oxygen, and no air breaks Treatment Details Compression Rate Down: 1.0 psi / minute De-Compression Rate Up: 2.0 psi / minute Air breaks and breathing Decompress Decompress Compress Tx Pressure Begins Reached periods Begins Ends (leave unused spaces blank) Chamber Pressure (ATA 1 2 ------2 1 ) Clock Time (24 hr) 12:34 12:48 - - - - - - 14:18 14:27 Treatment Length: 113 (minutes) Treatment Segments: 4 Vital Signs Capillary Blood Glucose Reference Range: 80 - 120 mg / dl HBO Diabetic Blood Glucose Intervention Range: <131 mg/dl or >782 mg/dl Type: Time Vitals Blood Pulse: Respiratory Temperature: Capillary Blood Glucose Pulse Action Taken: Pressure: Rate: Glucose (mg/dl): Meter #: Oximetry (%) Taken: Pre 12:15 139/87 110 16 98 238 asymptomatic for tachycardia Post 14:30 161/96 90 18 97.8 148 none per protocol Treatment Response Treatment Toleration: Well Treatment  Completion Status: Treatment Completed without Adverse Event Treatment Notes Mr. Dicola arrived heart rate of 110 bpm. He denied symptoms of tachycardia, stating that he felt fine. He stated that he had eaten a meal prior to arrival. He prepared for the treatment. After performing a safety check, he was placed in the chamber which was compressed with 100% oxygen at a rate of 2 psi/min after confirming normal ear equalization. He tolerated the treatment and subsequent decompression at the rate of 2 psi/min. He denied any issues with ear equalization and/or pain associated with barotrauma. Post-treatment vital signs were within normal range. He was stable upon discharge. Additional Procedure Documentation Tissue Sevierity: Necrosis of bone Elio Haden Notes No concern with treatment given. Patient was also seen for wound care evaluation Physician HBO Attestation: I certify that I supervised this HBO treatment in accordance with Medicare guidelines. A trained emergency response team is readily available per Yes RHODERICK, Kemond ([id number]) [id number] Page 2 of 3 hospital policies and procedures. Continue HBOT as ordered. Yes Electronic Signature(s) Signed: 01/23/2023 4:31:26 PM By: [name] MD Previous Signature: 01/23/2023 3:47:16 PM Version By: Haywood Pao CHT EMT BS , , Entered By: [name] on 01/23/2023 13:06:52 -------------------------------------------------------------------------------- HBO Safety Checklist Details Patient Name: Date of Service: [name] [name] 01/23/2023 12:00 PM Medical Record Number: [id number] Patient Account Number: [id number] Date of Birth/Sex: Treating RN: [birth year] (89 y.o. [name] Primary Care Jahne Krukowski: [name] Other Clinician: Haywood Pao Referring Gerrett Loman: Treating Brion Sossamon/Extender: [name] in Treatment: 29 HBO Safety Checklist  Items Safety Checklist Consent Form Signed Patient voided / foley secured and emptied When did you last eato 1000 Last dose of injectable or oral agent 0800 Ostomy pouch emptied and vented if applicable NA All implantable devices assessed, documented and approved NA Intravenous access site secured and place NA Valuables  secured Linens and cotton and cotton/polyester blend (less than 51% polyester) Personal oil-based products / skin lotions / body lotions removed Wigs or hairpieces removed NA Smoking or tobacco materials removed NA Books / newspapers / magazines / loose paper removed Cologne, aftershave, perfume and deodorant removed Jewelry removed (may wrap wedding band) Make-up removed NA Hair care products removed Battery operated devices (external) removed Heating patches and chemical warmers removed Titanium eyewear removed Nail polish cured greater than 10 hours NA Casting material cured greater than 10 hours NA Hearing aids removed removed Loose dentures or partials removed dentures removed Prosthetics have been removed NA Patient demonstrates correct use of air break device (if applicable) Patient concerns have been addressed Patient grounding bracelet on and cord attached to chamber Specifics for Inpatients (complete in addition to above) Medication sheet sent with patient NA Intravenous medications needed or due during therapy sent with patient NA Drainage tubes (e.g. nasogastric tube or chest tube secured and vented) NA Endotracheal or Tracheotomy tube secured NA Cuff deflated of air and inflated with saline NA Airway suctioned NA Notes Paper version used prior to treatment start. Electronic Signature(s) Signed: 01/23/2023 3:42:57 PM By: Haywood Pao CHT EMT BS , , [place], [place] ([id number]) [id number] Page 3 of 3 Entered By: Haywood Pao on 01/23/2023 12:42:57

## 2023-01-23 NOTE — Progress Notes (Signed)
[name], [name] ([id number]) [id number] Page 1 of 2 Visit Report for 01/23/2023 Arrival Information Details Patient Name: Date of Service: [name] 01/23/2023 12:00 PM Medical Record Number: [id number] Patient Account Number: [id number] Date of Birth/Sex: Treating RN: 01/18/1934 (89 y.o. [name] Primary Care Denney Shein: [name] Other Clinician: Haywood Pao Referring Jerianne Anselmo: Treating Devontaye Ground/Extender: [name] in Treatment: 29 Visit Information History Since Last Visit All ordered tests and consults were completed: Yes Patient Arrived: Ambulatory Added or deleted any medications: No Arrival Time: 12:02 Any new allergies or adverse reactions: No Accompanied By: self Had a fall or experienced change in No Transfer Assistance: None activities of daily living that may affect Patient Identification Verified: Yes risk of falls: Secondary Verification Process Completed: Yes Signs or symptoms of abuse/neglect since last visito No Patient Requires Transmission-Based Precautions: No Hospitalized since last visit: No Patient Has Alerts: Yes Implantable device outside of the clinic excluding No Patient Alerts: Patient on Blood Thinner cellular tissue based products placed in the center since last visit: Pain Present Now: No Electronic Signature(s) Signed: 01/23/2023 3:41:12 PM By: Haywood Pao CHT EMT BS , , Entered By: Haywood Pao on 01/23/2023 12:41:12 -------------------------------------------------------------------------------- Encounter Discharge Information Details Patient Name: Date of Service: [name] 01/23/2023 12:00 PM Medical Record Number: [id number] Patient Account Number: [id number] Date of Birth/Sex: Treating RN: 1933/05/05 (89 y.o. [name] Primary Care Comer Devins: [name] Other Clinician: Haywood Pao Referring  Advith Martine: Treating Castulo Scarpelli/Extender: [name] in Treatment: 29 Encounter Discharge Information Items Discharge Condition: Stable Ambulatory Status: Ambulatory Discharge Destination: Home Transportation: Private Auto Accompanied By: self Schedule Follow-up Appointment: No Clinical Summary of Care: Electronic Signature(s) Signed: 01/23/2023 3:48:22 PM By: Haywood Pao CHT EMT BS , , Previous Signature: 01/23/2023 3:48:08 PM Version By: Haywood Pao CHT EMT BS , , Entered By: Haywood Pao on 01/23/2023 12:48:21 [name], [name] ([id number]) [id number] Page 2 of 2 -------------------------------------------------------------------------------- Vitals Details Patient Name: Date of Service: [name] 01/23/2023 12:00 PM Medical Record Number: [id number] Patient Account Number: [id number] Date of Birth/Sex: Treating RN: 09-Feb-1934 (89 y.o. [name] Primary Care Marwa Fuhrman: [name] Other Clinician: [name] Referring Kiev Labrosse: Treating Lashya Passe/Extender: [name] in Treatment: 29 Vital Signs Time Taken: 12:15 Temperature (F): 98.0 Height (in): 71 Pulse (bpm): 110 Weight (lbs): 170 Respiratory Rate (breaths/min): 16 Body Mass Index (BMI): 23.7 Blood Pressure (mmHg): 139/87 Capillary Blood Glucose (mg/dl): 623 Reference Range: 80 - 120 mg / dl Electronic Signature(s) Signed: 01/23/2023 3:41:50 PM By: Haywood Pao CHT EMT BS , , Entered By: Haywood Pao on 01/23/2023 12:41:50

## 2023-01-24 ENCOUNTER — Encounter (HOSPITAL_BASED_OUTPATIENT_CLINIC_OR_DEPARTMENT_OTHER): Payer: PPO | Admitting: Internal Medicine

## 2023-01-24 DIAGNOSIS — E11621 Type 2 diabetes mellitus with foot ulcer: Secondary | ICD-10-CM | POA: Diagnosis not present

## 2023-01-24 LAB — GLUCOSE, CAPILLARY
Glucose-Capillary: 238 mg/dL — ABNORMAL HIGH (ref 70–99)
Glucose-Capillary: 148 mg/dL — ABNORMAL HIGH (ref 70–99)
Glucose-Capillary: 178 mg/dL — ABNORMAL HIGH (ref 70–99)
Glucose-Capillary: 98 mg/dL (ref 70–99)

## 2023-01-24 NOTE — Progress Notes (Signed)
[name] ([id number]) [id number] Page 1 of 10 Visit Report for 01/23/2023 Debridement Details Patient Name: Date of Service: [name] [name] 01/23/2023 2:45 PM Medical Record Number: [id number] Patient Account Number: [id number] Date of Birth/Sex: Treating RN: 09-09-1933 (89 y.o. M) Primary Care Provider: [name] Other Clinician: Referring Provider: Treating Provider/Extender: [name] in Treatment: 29 Debridement Performed for Assessment: Wound #4 Right,Lateral Foot Performed By: Physician Maxwell Caul., MD The following information was scribed by: Shawn Stall The information was scribed for: [name] Debridement Type: Debridement Severity of Tissue Pre Debridement: Fat layer exposed Level of Consciousness (Pre-procedure): Awake and Alert Pre-procedure Verification/Time Out Yes - 15:20 Taken: Start Time: 15:21 Pain Control: Lidocaine 4% T opical Solution Percent of Wound Bed Debrided: 100% T Area Debrided (cm): otal 4.87 Tissue and other material debrided: Viable, Non-Viable, Slough, Subcutaneous, Slough Level: Skin/Subcutaneous Tissue Debridement Description: Excisional Instrument: Curette Bleeding: Minimum Hemostasis Achieved: Pressure End Time: 15:27 Procedural Pain: 0 Post Procedural Pain: 0 Response to Treatment: Procedure was tolerated well Level of Consciousness (Post- Awake and Alert procedure): Post Debridement Measurements of Total Wound Length: (cm) 2.7 Width: (cm) 2.3 Depth: (cm) 0.4 Volume: (cm) 1.951 Character of Wound/Ulcer Post Debridement: Improved Severity of Tissue Post Debridement: Fat layer exposed Post Procedure Diagnosis Same as Pre-procedure Electronic Signature(s) Signed: 01/23/2023 4:31:26 PM By: [name] MD Entered By: [name] on 01/23/2023  12:38:03 -------------------------------------------------------------------------------- HPI Details Patient Name: Date of Service: [name] [name] 01/23/2023 2:45 PM Medical Record Number: [id number] Patient Account Number: [id number] Date of Birth/Sex: Treating RN: Sep 22, 1933 (89 y.o. M) Primary Care Provider: [name] Other Clinician: Referring Provider: Treating Provider/Extender: [name] in Treatment: [address] [place] ([id number]) [id number] Page 2 of 10 History of Present Illness HPI Description: 07/04/2022 [name] is an 89 year old male with a past medical history of uncontrolled, insulin dependent type 2 diabetes, CKD stage III, and COPD that presents to the clinic for a right foot wound. He has an extensive history of peripheral arterial disease to the right leg. He has had a right superficial femoral to posterior tibial artery bypass by Dr. Elonda Husky in 2020. He had a right common femoral to plantar posterior tibial artery bypass on 02/03/2022 by Dr. Lenell Antu and at that time he also had a right toe amputation. He states this wound site had healed completely. Prior to this he had amputations to the first and second digit to the right foot. He presents today with an open wound to the previous right toe amputation site for the past 2 months. He has been following with vein and vascular for this issue. Most recent ABIs were on 06/22/2022 that showed absent waveforms in the right lower extremity. Currently his right lower extremity bypass is occluded and no plan for another anteriorgram and has he likely has no revascularization options. Currently he has been using Betadine to the wound bed. He denies signs of infection. 07/11/2022 patient arrives in clinic today using Hydrofera Blue on the toe amputation site. The patient is revascularized and is not felt to have any other options for  revascularization. We are using Hydrofera Blue and gauze. 5/13; patient presents for follow-up. He has been using Hydrofera Blue to the toe amputation site. He has had no issues. Wound is smaller. He has been approved for [facility] and was agreeable to have this placed in office today. 5/20; patient presents for follow-up. We have been using Kerecis to the wound bed.  There is been improvement in healing. Unfortunately patient did not change the outer dressing for a week. There is slight maceration to the periwound. Unclear why he did not do this. 5/30; patient presents for follow-up. We have been using Kerecis to the wound bed. More maceration to the periwound today. No signs of infection. 6/10; patient presents for follow-up. He has been using Hydrofera Blue to the wound bed. He has no issues or complaints today. Maceration has almost completely resolved. 6/18; patient presents for follow-up. He has been using blast X and collagen to the wound bed without issues. Wound appears smaller today. 6/28; patient presents for follow-up. He has been using blast X and collagen to the wound bed. The wound is smaller. 7/11; patient presents for follow-up. He has been using blast X and collagen to the wound bed. Wound is smaller. He has slough accumulation. 7/25; patient presents for follow-up. He has been using blast X and collagen to the wound bed. Wound is stable. He denies signs of infection. 8/26; patient presents for follow-up. He has been using blast X and collagen to the wound bed. Wound is slightly smaller. He has no issues or complaints. 8/29; patient presents for follow-up. He has been using blast X and Aquacel Ag to the wound bed. He has been using a Velcro wrap at his toes to keep the toes closer together to help with wound healing. He has no issues or complaints today. Wound is smaller. 9/12; patient presents for follow-up. He has been using blast X and Aquacel Ag to the wound bed. Unfortunately he  has developed a pressure injury to the lateral aspect of the foot. He is not sure how this started. No open wound there. He denies pain. 9/23; this patient has a wound on the right foot at the amputation site of her third toe. It is right up against the fourth toe making it somewhat difficult to dress. When he was here last time he had a blister/DTI on the lateral aspect of the fifth MTP 9/30; patient has a wound on the right foot amputation site of his third toe. Probably in the third fourth webspace. I changed him to endoform last week. The wound looks somewhat better The area over the lateral part of the fifth metatarsal head which was blistered with some central deep tissue looking injury was leaking fluid today. The blister was unroofed. I used pickups and scissors to remove this. I do not know that there is anything technically open here although there is certainly some central tissue at risk 10/7; his original wound at the amputation site in his third fourth webspace looks stable to improved. We have been using endoform. The real concern today is the area over the lateral part of the fifth metatarsal head. This initially looked like a blister with central deep tissue injury I unroofed the blister last week there was nothing technically open but the deep tissue injury was concerning. T oday he comes in with a necrotic surface I remove this. The central part of this area which I am assuming was the deep tissue injury is necrotic I removed some of the necrotic tissue with a #15 scalpel and pickups. There is undermining around the wound edge here. 10/14; his original wound at the amputation site of his third fourth toe webspace looks improved we have gradually been making progress here. Using endoform. Unfortunately the area over the lateral part of the right fifth metatarsal head continues to deteriorate. Necrotic surface last week  continues this week. The PCR culture I did last week did not  manage to make it to the right lab. I have looked at his x-ray the bone underneath does not look that bad to me however we have not yet had a radiologist interpretation. We have been using Iodoflex this week. 10/21; the original amputation site of his third fourth toe webspace continues to look reasonably healthy and making slow but steady progress we have been using endoform The area on the right lateral fifth metatarsal head has been deteriorating problem. PCR culture grew MRSA. I have him on doxycycline. There is exposed bone in the wound under necrotic material. I still do not have the x-ray back from October 7 at least in terms of the report. Addendum 12/27/2022; I have looked back on this patient's vascular status. He is status post a right common femoral to posterior tibial artery bypass in 2020. He had a right common femoral to plantar posterior tibial bypass by Dr. Lenell Antu in November 2023. He also had a right third toe amputation at the same time. The wound that we have been dealing with the third toe amputation site was present after the surgery. He was last seen by vascular surgery on 06/21/2022 and was supposed to follow-up with Dr. Lenell Antu in 3 weeks although I do not see that appointment. I have included the major findings in the last right lower extremity angiogram on 01/21/2022 10/28;Marland Kitchen The x-ray report that I ordered several weeks ago is back. Indeed there was nothing under the fifth met head on the right however notable for bone necrosis under the wound on the left third toe. This suggest osteomyelitis. I am going to extend his doxycycline for 3 further weeks. PCR culture on the lateral foot grew MRSA I sent a secure text message to Dr. Juanetta Gosling. His reply was that there was no additional options for revascularization. The patient went to see Dr. Juanetta Gosling and that is the message they came up with. He did offer him an amputation [o Above-knee]. We have been using endoform to the third toe  amputation site. Silver alginate to the new wound over the fifth metatarsal head 11/4; patient continues on doxycycline which she is handling well. He started hyperbaric oxygen today and tolerated the first treatment well. We have been using endoform to the third toe amputation site which was the original wound he came to the clinic for. We are using Santyl and silver alginate to the wound Kopera, Zyheir ([id number]) [id number] Page 3 of 10 over the fifth metatarsal head which was the wound that opened while he was under observation. Culture grew MRSA and the wound over the fifth metatarsal head 11/11 continuing doxycycline. He is tolerating hyperbarics although I am continually notified about tachycardia in the 100-110 range. Looking back on EKGs shows this is a longstanding problem sinus tachycardia. He also has left anterior fascicular block. He does not describe chest pain The original wound on the third toe amputation site looks a lot better this is contracting however the area on the right lateral fifth metatarsal head only has a rim of height of granulation tissue around what looks to be periosteum 11/18; continuing doxycycline. He is tolerating hyperbarics. The original wound on the third toe amputation site is very small and healthy however the wound the developed on the right lateral fifth met head which probably was a combination of infection and ischemic has a nonviable surface. We have been using Hydrofera Blue and Santyl he continues on  doxycycline. The doxycycline was same that the x-ray from 10/31 showing high-grade phalangeal bone erosion suspicious for osteomyelitis. At that time there was nothing demonstrated on the fifth met head. I have repeated the x-ray of the foot looking specifically at the fifth met head however the report is not available. Electronic Signature(s) Signed: 01/23/2023 4:31:26 PM By: [name] MD Entered By: [name] on 01/23/2023 12:41:07 -------------------------------------------------------------------------------- Physical Exam Details Patient Name: Date of Service: [name] [name] 01/23/2023 2:45 PM Medical Record Number: [id number] Patient Account Number: [id number] Date of Birth/Sex: Treating RN: 12/03/1933 (89 y.o. M) Primary Care Provider: [name] Other Clinician: Referring Provider: Treating Provider/Extender: [name] in Treatment: 29 Notes Wound exam The third metatarsal head amputation site has contracted nicely only a very small wound remains here. Unfortunately the area on the right fifth met head had necrotic tissue some surrounding granulation tissue. I removed some of the necrotic tissue only defined exposed bone. No evidence of infection and no periwound tenderness Electronic Signature(s) Signed: 01/23/2023 4:31:26 PM By: [name] MD Entered By: [name] on 01/23/2023 12:41:53 -------------------------------------------------------------------------------- Physician Orders Details Patient Name: Date of Service: [name] [name] 01/23/2023 2:45 PM Medical Record Number: [id number] Patient Account Number: [id number] Date of Birth/Sex: Treating RN: 1933-12-04 (89 y.o. [name] Primary Care Provider: [name] Other Clinician: Referring Provider: Treating Provider/Extender: [name] in Treatment: 29 The following information was scribed by: Shawn Stall The information was scribed for: [name] Verbal / Phone Orders: No Diagnosis Coding ICD-10 Coding Code Description L97.512 Non-pressure chronic ulcer of other part of right foot with fat layer exposed L97.514 Non-pressure chronic ulcer of other part of right foot with necrosis of bone E11.621 Type 2 diabetes mellitus with foot ulcer Whitmoyer, Brewer ([id number]) [id number]  Page 4 of 10 I70.235 Atherosclerosis of native arteries of right leg with ulceration of other part of foot Follow-up Appointments ppointment in 2 weeks. - Dr. Leanord Hawking first week of december (front office to schedule) Return A Other: - continue current dressing. Anesthetic (In clinic) Topical Lidocaine 5% applied to wound bed Cellular or Tissue Based Products Wound #4 Right,Lateral Foot Cellular or Tissue Based Product Type: - apligraf- run insurance to see if approved. Bathing/ Shower/ Hygiene May shower with protection but do not get wound dressing(s) wet. Protect dressing(s) with water repellant cover (for example, large plastic bag) or a cast cover and may then take shower. Off-Loading Open toe surgical shoe to: - right foot. wear while walking and standing. minimize pressure and walking/ standing. Hyperbaric Oxygen Therapy Wound #3 Right Amputation Site - Toe Evaluate for HBO Therapy Indication: - Wagner Grade 3 If appropriate for treatment, begin HBOT per protocol: 2.0 ATA for 90 Minutes without A Breaks ir Total Number of Treatments: - 30 One treatments per day (delivered Monday through Friday unless otherwise specified in Special Instructions below): Finger stick Blood Glucose Pre- and Post- HBOT Treatment. Follow Hyperbaric Oxygen Glycemia Protocol Give two 4oz orange juices in addition to Glucerna when the glycemic protocol is used. A frin (Oxymetazoline HCL) 0.05% nasal spray - 1 spray in both nostrils daily as needed prior to HBO treatment for difficulty clearing ears Wound Treatment Wound #3 - Amputation Site - Toe Wound Laterality: Right Cleanser: Vashe 5.8 (oz) Every Other Day/15 Days Discharge Instructions: Cleanse the wound with Vashe prior to applying a clean dressing using gauze sponges, not tissue or cotton balls. Topical: Skintegrity Hydrogel 4 (oz)  Every Other Day/15 Days Discharge Instructions: Apply hydrogel as directed Prim Dressing: Endoform 2x2 in Every  Other Day/15 Days ary Discharge Instructions: Moisten with saline Secondary Dressing: Woven Gauze Sponge, Non-Sterile 4x4 in (Generic) Every Other Day/15 Days Discharge Instructions: Apply over primary dressing as directed. Secured With: Paper Tape, 2x10 (in/yd) Every Other Day/15 Days Discharge Instructions: Secure dressing with tape as directed. Wound #4 - Foot Wound Laterality: Right, Lateral Cleanser: Soap and Water 1 x Per Day/30 Days Discharge Instructions: May shower and wash wound with dial antibacterial soap and water prior to dressing change. Cleanser: Wound Cleanser 1 x Per Day/30 Days Discharge Instructions: Cleanse the wound with wound cleanser prior to applying a clean dressing using gauze sponges, not tissue or cotton balls. Peri-Wound Care: Skin Prep 1 x Per Day/30 Days Discharge Instructions: Use skin prep as directed Prim Dressing: Maxorb Extra Ag+ Alginate Dressing, 2x2 (in/in) 1 x Per Day/30 Days ary Discharge Instructions: Apply over the santyl as instructed Prim Dressing: Santyl Ointment 1 x Per Day/30 Days ary Discharge Instructions: Apply nickel thick amount to wound bed as instructed Secondary Dressing: Optifoam Non-Adhesive Dressing, 4x4 in 1 x Per Day/30 Days Discharge Instructions: Apply foam donut. Secondary Dressing: Woven Gauze Sponges 2x2 in 1 x Per Day/30 Days Discharge Instructions: Apply over primary dressing as directed. Secured With: Insurance underwriter, Sterile 2x75 (in/in) 1 x Per Day/30 Days Discharge Instructions: Secure with stretch gauze as directed. Secured With: 96M Medipore H Soft Cloth Surgical T ape, 4 x 10 (in/yd) 1 x Per Day/30 Days Discharge Instructions: Secure with tape as directed. WITTIG, Amirr ([id number]) [id number] Page 5 of 10 GLYCEMIA INTERVENTIONS PROTOCOL PRE-HBO GLYCEMIA INTERVENTIONS ACTION INTERVENTION Obtain pre-HBO capillary blood glucose (ensure 1 physician order is in  chart). A. Notify HBO physician and await physician orders. 2 If result is 70 mg/dl or below: B. If the result meets the hospital definition of a critical result, follow hospital policy. A. Give patient an 8 ounce Glucerna Shake, an 8 ounce Ensure, or 8 ounces of a Glucerna/Ensure equivalent dietary supplement*. B. Wait 30 minutes. If result is 71 mg/dl to 875 mg/dl: C. Retest patients capillary blood glucose (CBG). D. If result greater than or equal to 110 mg/dl, proceed with HBO. If result less than 110 mg/dl, notify HBO physician and consider holding HBO. If result is 131 mg/dl to 643 mg/dl: A. Proceed with HBO. A. Notify HBO physician and await physician orders. B. It is recommended to hold HBO and do If result is 250 mg/dl or greater: blood/urine ketone testing. C. If the result meets the hospital definition of a critical result, follow hospital policy. POST-HBO GLYCEMIA INTERVENTIONS ACTION INTERVENTION Obtain post HBO capillary blood glucose (ensure 1 physician order is in chart). A. Notify HBO physician and await physician orders. 2 If result is 70 mg/dl or below: B. If the result meets the hospital definition of a critical result, follow hospital policy. A. Give patient an 8 ounce Glucerna Shake, an 8 ounce Ensure, or 8 ounces of a Glucerna/Ensure equivalent dietary supplement*. B. Wait 15 minutes for symptoms of If result is 71 mg/dl to 329 mg/dl: hypoglycemia (i.e. nervousness, anxiety, sweating, chills, clamminess, irritability, confusion, tachycardia or dizziness). C. If patient asymptomatic, discharge patient. If patient symptomatic, repeat capillary blood glucose (CBG) and notify HBO physician. If result is 101 mg/dl to 518 mg/dl: A. Discharge patient. A. Notify HBO physician and await physician orders. B. It is recommended to do blood/urine ketone If result is 250  mg/dl or greater: testing. C. If the result meets the hospital definition of  a critical result, follow hospital policy. *Juice or candies are NOT equivalent products. If patient refuses the Glucerna or Ensure, please consult the hospital dietitian for an appropriate substitute. Electronic Signature(s) Signed: 01/23/2023 4:31:26 PM By: [name] MD Signed: 01/23/2023 5:09:00 PM By: Shawn Stall RN, BSN Entered By: Shawn Stall on 01/23/2023 12:31:17 -------------------------------------------------------------------------------- Problem List Details Patient Name: Date of Service: [name] [name] 01/23/2023 2:45 PM Medical Record Number: [id number] Patient Account Number: [id number] Date of Birth/Sex: Treating RN: 01/10/1934 (89 y.o. [name] Primary Care Provider: [name] Other Clinician: Referring Provider: Treating Provider/Extender: [name] in Treatment: [address] [place], [place] ([id number]) [id number] Page 6 of 10 ICD-10 Encounter Code Description Active Date MDM Diagnosis L97.512 Non-pressure chronic ulcer of other part of right foot with fat layer exposed 07/04/2022 No Yes L97.514 Non-pressure chronic ulcer of other part of right foot with necrosis of bone 12/26/2022 No Yes E11.621 Type 2 diabetes mellitus with foot ulcer 07/04/2022 No Yes I70.235 Atherosclerosis of native arteries of right leg with ulceration of other part of 07/04/2022 No Yes foot Inactive Problems Resolved Problems Electronic Signature(s) Signed: 01/23/2023 4:31:26 PM By: [name] MD Entered By: [name] on 01/23/2023 12:37:10 -------------------------------------------------------------------------------- Progress Note Details Patient Name: Date of Service: [name] [name] 01/23/2023 2:45 PM Medical Record Number: [id number] Patient Account Number: [id number] Date of Birth/Sex: Treating RN: April 07, 1933 (89 y.o. M) Primary Care Provider: [name] Other  Clinician: Referring Provider: Treating Provider/Extender: [name] in Treatment: 29 Subjective History of Present Illness (HPI) 07/04/2022 Mr. Domique Sliman is an 89 year old male with a past medical history of uncontrolled, insulin dependent type 2 diabetes, CKD stage III, and COPD that presents to the clinic for a right foot wound. He has an extensive history of peripheral arterial disease to the right leg. He has had a right superficial femoral to posterior tibial artery bypass by Dr. Elonda Husky in 2020. He had a right common femoral to plantar posterior tibial artery bypass on 02/03/2022 by Dr. Lenell Antu and at that time he also had a right toe amputation. He states this wound site had healed completely. Prior to this he had amputations to the first and second digit to the right foot. He presents today with an open wound to the previous right toe amputation site for the past 2 months. He has been following with vein and vascular for this issue. Most recent ABIs were on 06/22/2022 that showed absent waveforms in the right lower extremity. Currently his right lower extremity bypass is occluded and no plan for another anteriorgram and has he likely has no revascularization options. Currently he has been using Betadine to the wound bed. He denies signs of infection. 07/11/2022 patient arrives in clinic today using Hydrofera Blue on the toe amputation site. The patient is revascularized and is not felt to have any other options for revascularization. We are using Hydrofera Blue and gauze. 5/13; patient presents for follow-up. He has been using Hydrofera Blue to the toe amputation site. He has had no issues. Wound is smaller. He has been approved for [facility] and was agreeable to have this placed in office today. 5/20; patient presents for follow-up. We have been using Kerecis to the wound bed. There is been improvement in healing. Unfortunately patient did not change the  outer dressing for a week. There is slight maceration to the periwound.  Unclear why he did not do this. 5/30; patient presents for follow-up. We have been using Kerecis to the wound bed. More maceration to the periwound today. No signs of infection. 6/10; patient presents for follow-up. He has been using Hydrofera Blue to the wound bed. He has no issues or complaints today. Maceration has almost completely resolved. 6/18; patient presents for follow-up. He has been using blast X and collagen to the wound bed without issues. Wound appears smaller today. 6/28; patient presents for follow-up. He has been using blast X and collagen to the wound bed. The wound is smaller. 7/11; patient presents for follow-up. He has been using blast X and collagen to the wound bed. Wound is smaller. He has slough accumulation. 7/25; patient presents for follow-up. He has been using blast X and collagen to the wound bed. Wound is stable. He denies signs of infection. WALLOCH, Kayson ([id number]) [id number] Page 7 of 10 8/26; patient presents for follow-up. He has been using blast X and collagen to the wound bed. Wound is slightly smaller. He has no issues or complaints. 8/29; patient presents for follow-up. He has been using blast X and Aquacel Ag to the wound bed. He has been using a Velcro wrap at his toes to keep the toes closer together to help with wound healing. He has no issues or complaints today. Wound is smaller. 9/12; patient presents for follow-up. He has been using blast X and Aquacel Ag to the wound bed. Unfortunately he has developed a pressure injury to the lateral aspect of the foot. He is not sure how this started. No open wound there. He denies pain. 9/23; this patient has a wound on the right foot at the amputation site of her third toe. It is right up against the fourth toe making it somewhat difficult to dress. When he was here last time he had a blister/DTI on the  lateral aspect of the fifth MTP 9/30; patient has a wound on the right foot amputation site of his third toe. Probably in the third fourth webspace. I changed him to endoform last week. The wound looks somewhat better The area over the lateral part of the fifth metatarsal head which was blistered with some central deep tissue looking injury was leaking fluid today. The blister was unroofed. I used pickups and scissors to remove this. I do not know that there is anything technically open here although there is certainly some central tissue at risk 10/7; his original wound at the amputation site in his third fourth webspace looks stable to improved. We have been using endoform. The real concern today is the area over the lateral part of the fifth metatarsal head. This initially looked like a blister with central deep tissue injury I unroofed the blister last week there was nothing technically open but the deep tissue injury was concerning. T oday he comes in with a necrotic surface I remove this. The central part of this area which I am assuming was the deep tissue injury is necrotic I removed some of the necrotic tissue with a #15 scalpel and pickups. There is undermining around the wound edge here. 10/14; his original wound at the amputation site of his third fourth toe webspace looks improved we have gradually been making progress here. Using endoform. Unfortunately the area over the lateral part of the right fifth metatarsal head continues to deteriorate. Necrotic surface last week continues this week. The PCR culture I did last week did not manage to make it  to the right lab. I have looked at his x-ray the bone underneath does not look that bad to me however we have not yet had a radiologist interpretation. We have been using Iodoflex this week. 10/21; the original amputation site of his third fourth toe webspace continues to look reasonably healthy and making slow but steady progress we have  been using endoform The area on the right lateral fifth metatarsal head has been deteriorating problem. PCR culture grew MRSA. I have him on doxycycline. There is exposed bone in the wound under necrotic material. I still do not have the x-ray back from October 7 at least in terms of the report. Addendum 12/27/2022; I have looked back on this patient's vascular status. He is status post a right common femoral to posterior tibial artery bypass in 2020. He had a right common femoral to plantar posterior tibial bypass by Dr. Lenell Antu in November 2023. He also had a right third toe amputation at the same time. The wound that we have been dealing with the third toe amputation site was present after the surgery. He was last seen by vascular surgery on 06/21/2022 and was supposed to follow-up with Dr. Lenell Antu in 3 weeks although I do not see that appointment. I have included the major findings in the last right lower extremity angiogram on 01/21/2022 10/28;Marland Kitchen The x-ray report that I ordered several weeks ago is back. Indeed there was nothing under the fifth met head on the right however notable for bone necrosis under the wound on the left third toe. This suggest osteomyelitis. I am going to extend his doxycycline for 3 further weeks. PCR culture on the lateral foot grew MRSA I sent a secure text message to Dr. Juanetta Gosling. His reply was that there was no additional options for revascularization. The patient went to see Dr. Juanetta Gosling and that is the message they came up with. He did offer him an amputation [o Above-knee]. We have been using endoform to the third toe amputation site. Silver alginate to the new wound over the fifth metatarsal head 11/4; patient continues on doxycycline which she is handling well. He started hyperbaric oxygen today and tolerated the first treatment well. We have been using endoform to the third toe amputation site which was the original wound he came to the clinic for. We are using  Santyl and silver alginate to the wound over the fifth metatarsal head which was the wound that opened while he was under observation. Culture grew MRSA and the wound over the fifth metatarsal head 11/11 continuing doxycycline. He is tolerating hyperbarics although I am continually notified about tachycardia in the 100-110 range. Looking back on EKGs shows this is a longstanding problem sinus tachycardia. He also has left anterior fascicular block. He does not describe chest pain The original wound on the third toe amputation site looks a lot better this is contracting however the area on the right lateral fifth metatarsal head only has a rim of height of granulation tissue around what looks to be periosteum 11/18; continuing doxycycline. He is tolerating hyperbarics. The original wound on the third toe amputation site is very small and healthy however the wound the developed on the right lateral fifth met head which probably was a combination of infection and ischemic has a nonviable surface. We have been using Hydrofera Blue and Santyl he continues on doxycycline. The doxycycline was same that the x-ray from 10/31 showing high-grade phalangeal bone erosion suspicious for osteomyelitis. At that time there was nothing  demonstrated on the fifth met head. I have repeated the x-ray of the foot looking specifically at the fifth met head however the report is not available. Objective Constitutional Vitals Time Taken: 3:05 PM, Height: 71 in, Weight: 170 lbs, BMI: 23.7. General Notes: VS done in HBO Integumentary (Hair, Skin) Wound #3 status is Open. Original cause of wound was Gradually Appeared. The date acquired was: 04/07/2022. The wound has been in treatment 29 weeks. The wound is located on the Right Amputation Site - T The wound measures 0.2cm length x 0.2cm width x 0.1cm depth; 0.031cm^2 area and 0.003cm^3 volume. oe. There is Fat Layer (Subcutaneous Tissue) exposed. There is no tunneling or  undermining noted. There is a medium amount of serosanguineous drainage noted. LADY, Hubbert ([id number]) [id number] Page 8 of 10 The wound margin is epibole. There is no granulation within the wound bed. There is a large (67-100%) amount of necrotic tissue within the wound bed including Adherent Slough. The periwound skin appearance exhibited: Maceration. The periwound skin appearance did not exhibit: Callus, Crepitus, Excoriation, Induration, Rash, Scarring, Dry/Scaly, Atrophie Blanche, Cyanosis, Ecchymosis, Hemosiderin Staining, Mottled, Pallor, Rubor, Erythema. Periwound temperature was noted as No Abnormality. Wound #4 status is Open. Original cause of wound was Shear/Friction. The date acquired was: 11/21/2022. The wound has been in treatment 7 weeks. The wound is located on the Right,Lateral Foot. The wound measures 2.7cm length x 2.3cm width x 0.4cm depth; 4.877cm^2 area and 1.951cm^3 volume. There is bone and Fat Layer (Subcutaneous Tissue) exposed. There is no tunneling or undermining noted. There is a medium amount of serosanguineous drainage noted. The wound margin is distinct with the outline attached to the wound base. There is small (1-33%) granulation within the wound bed. There is a large (67- 100%) amount of necrotic tissue within the wound bed including Eschar and Adherent Slough. The periwound skin appearance exhibited: Maceration. The periwound skin appearance did not exhibit: Callus, Crepitus, Excoriation, Induration, Rash, Scarring, Dry/Scaly, Atrophie Blanche, Cyanosis, Ecchymosis, Hemosiderin Staining, Mottled, Pallor, Rubor, Erythema. Periwound temperature was noted as No Abnormality. 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 There was a Excisional Skin/Subcutaneous Tissue Debridement with a total area of 4.87 sq cm performed by Maxwell Caul., MD. With the following instrument(s): Curette to remove Viable and Non-Viable tissue/material. Material removed includes Subcutaneous Tissue and Slough and after achieving pain control using Lidocaine 4% Topical Solution. A time out was conducted at 15:20, prior to the start of the procedure. A Minimum amount of bleeding was controlled with Pressure. The procedure was tolerated well with a pain level of 0 throughout and a pain level of 0 following the procedure. Post Debridement Measurements: 2.7cm length x 2.3cm width x 0.4cm depth; 1.951cm^3 volume. Character of Wound/Ulcer Post Debridement is improved. Severity of Tissue Post Debridement is: Fat layer exposed. Post procedure Diagnosis Wound #4: Same as Pre-Procedure Plan Follow-up Appointments: Return Appointment in 2 weeks. - Dr. Leanord Hawking first week of december (front office to schedule) Other: - continue current dressing. Anesthetic: (In clinic) Topical Lidocaine 5% applied to wound bed Cellular  or Tissue Based Products: Wound #4 Right,Lateral Foot: Cellular or Tissue Based Product Type: - apligraf- run insurance to see if approved. Bathing/ Shower/ Hygiene: May shower with protection but do not get wound dressing(s) wet. Protect dressing(s) with water repellant cover (for example, large plastic bag) or a cast cover and may then take shower. Off-Loading: Open toe surgical shoe to: - right foot. wear while walking and standing. minimize pressure and walking/ standing. Hyperbaric Oxygen Therapy: Wound #3 Right Amputation Site - T oe: Evaluate for HBO  Therapy Indication: - Wagner Grade 3 If appropriate for treatment, begin HBOT per protocol: 2.0 ATA for 90 Minutes without Air Breaks T Number of Treatments: - 30 otal One treatments per day (delivered Monday through Friday unless otherwise specified in Special Instructions below): Finger stick Blood Glucose Pre- and Post- HBOT Treatment. Follow Hyperbaric Oxygen Glycemia Protocol Give two 4oz orange juices in addition to Glucerna when the glycemic protocol is used. Afrin (Oxymetazoline HCL) 0.05% nasal spray - 1 spray in both nostrils daily as needed prior to HBO treatment for difficulty clearing ears WOUND #3: - Amputation Site - T oe Wound Laterality: Right Cleanser: Vashe 5.8 (oz) Every Other Day/15 Days Discharge Instructions: Cleanse the wound with Vashe prior to applying a clean dressing using gauze sponges, not tissue or cotton balls. Topical: Skintegrity Hydrogel 4 (oz) Every Other Day/15 Days Discharge Instructions: Apply hydrogel as directed Prim Dressing: Endoform 2x2 in Every Other Day/15 Days ary Discharge Instructions: Moisten with saline Secondary Dressing: Woven Gauze Sponge, Non-Sterile 4x4 in (Generic) Every Other Day/15 Days Discharge Instructions: Apply over primary dressing as directed. Secured With: Paper T ape, 2x10 (in/yd) Every Other Day/15 Days Discharge Instructions: Secure dressing with tape as directed. WOUND #4: - Foot Wound Laterality: Right, Lateral Cleanser: Soap and Water 1 x Per Day/30 Days MAX, Chez ([id number]) [phone].pdf Page 9 of 10 Discharge Instructions: May shower and wash wound with dial antibacterial soap and water prior to dressing change. Cleanser: Wound Cleanser 1 x Per Day/30 Days Discharge Instructions: Cleanse the wound with wound cleanser prior to applying a clean dressing using gauze sponges, not tissue or cotton balls. Peri-Wound Care: Skin Prep 1 x Per Day/30 Days Discharge Instructions: Use  skin prep as directed Prim Dressing: Maxorb Extra Ag+ Alginate Dressing, 2x2 (in/in) 1 x Per Day/30 Days ary Discharge Instructions: Apply over the santyl as instructed Prim Dressing: Santyl Ointment 1 x Per Day/30 Days ary Discharge Instructions: Apply nickel thick amount to wound bed as instructed Secondary Dressing: Optifoam Non-Adhesive Dressing, 4x4 in 1 x Per Day/30 Days Discharge Instructions: Apply foam donut. Secondary Dressing: Woven Gauze Sponges 2x2 in 1 x Per Day/30 Days Discharge Instructions: Apply over primary dressing as directed. Secured With: Insurance underwriter, Sterile 2x75 (in/in) 1 x Per Day/30 Days Discharge Instructions: Secure with stretch gauze as directed. Secured With: 32M Medipore H Soft Cloth Surgical T ape, 4 x 10 (in/yd) 1 x Per Day/30 Days Discharge Instructions: Secure with tape as directed. 1. We are treating him for osteomyelitis of the third metatarsal head. Using doxycycline and hyperbarics. We seem to be making good improvement. 2. Unfortunately the area on the fifth metatarsal head is exposed bone. Our x-ray report is not available here. I do not know that there is enough surrounding healthy tissue to stimulate granulation over the surface but we will run Apligraf to attempt this purpose. 3. He does not have enough vascular supply to support foot conserving surgery. I have already referred him  to vascular surgery for this. They said the only option would be a BKA 4. I am continuing with Santyl Hydrofera Blue to the fifth met head and endoform to the third toe amputation site 5. Continue HBO Electronic Signature(s) Signed: 01/23/2023 4:31:26 PM By: [name] MD Entered By: [name] on 01/23/2023 12:43:59 -------------------------------------------------------------------------------- SuperBill Details Patient Name: Date of Service: [name] [name] 01/23/2023 Medical Record Number: [id number] Patient Account Number:  [id number] Date of Birth/Sex: Treating RN: 25-Mar-1933 (89 y.o. [name] Primary Care Provider: [name] Other Clinician: Referring Provider: Treating Provider/Extender: [name] in Treatment: 29 Diagnosis Coding ICD-10 Codes Code Description [phone] Non-pressure chronic ulcer of other part of right foot with fat layer exposed L97.514 Non-pressure chronic ulcer of other part of right foot with necrosis of bone E11.621 Type 2 diabetes mellitus with foot ulcer I70.235 Atherosclerosis of native arteries of right leg with ulceration of other part of foot Facility Procedures : CPT4 Code: [id number] Description: 11042 - DEB SUBQ TISSUE 20 SQ CM/< ICD-10 Diagnosis Description L97.514 Non-pressure chronic ulcer of other part of right foot with necrosis of bone E11.621 Type 2 diabetes mellitus with foot ulcer Modifier: Quantity: 1 Physician Procedures Electronic Signature(s) Signed: 01/23/2023 4:31:26 PM By: [name] MD Entered By: [name] on 01/23/2023 12:44:10

## 2023-01-24 NOTE — Progress Notes (Addendum)
LORIG, Cobi ([id number]) [id number] Page 1 of 3 Visit Report for 01/24/2023 HBO Details Patient Name: Date of Service: [name] 01/24/2023 12:00 PM Medical Record Number: [id number] Patient Account Number: [id number] Date of Birth/Sex: Treating RN: May 19, 1933 (89 y.o. [name] Primary Care Denham Mose: [name] Other Clinician: Haywood Pao Referring Aliyanna Wassmer: Treating Jirah Rider/Extender: [name] in Treatment: 29 HBO Treatment Course Details Treatment Course Number: 1 Ordering Taje Littler: [name] T Treatments Ordered: otal 40 HBO Treatment Start Date: 01/09/2023 HBO Indication: Diabetic Ulcer(s) of the Lower Extremity Standard/Conservative Wound Care tried and failed greater than or equal to 30 days Wound #3 Right Amputation Site - T oe HBO Treatment Details Treatment Number: 12 Patient Type: Outpatient Chamber Type: Monoplace Chamber Serial #: [id number] Treatment Protocol: 2.0 ATA with 90 minutes oxygen, and no air breaks Treatment Details Compression Rate Down: 1.0 psi / minute De-Compression Rate Up: 2.0 psi / minute Air breaks and breathing Decompress Decompress Compress Tx Pressure Begins Reached periods Begins Ends (leave unused spaces blank) Chamber Pressure (ATA 1 2 ------2 1 ) Clock Time (24 hr) 12:35 12:47 - - - - - - 14:17 14:25 Treatment Length: 110 (minutes) Treatment Segments: 4 Vital Signs Capillary Blood Glucose Reference Range: 80 - 120 mg / dl HBO Diabetic Blood Glucose Intervention Range: <131 mg/dl or >629 mg/dl Type: Time Vitals Blood Pulse: Respiratory Temperature: Capillary Blood Glucose Pulse Action Taken: Pressure: Rate: Glucose (mg/dl): Meter #: Oximetry (%) Taken: Pre 12:18 133/79 108 18 98.1 178 none per protocol Post 14:28 139/104 81 18 98.2 98 Glucerna given, re-measured BP Post 14:32 160/92 none per protocol. Treatment  Response Treatment Toleration: Well Treatment Completion Status: Treatment Completed without Adverse Event Treatment Notes [name] arrived heart rate of 108 bpm. He denied symptoms of tachycardia, stating that he felt fine. He stated that he had eaten a meal prior to arrival. He prepared for the treatment. After performing a safety check, he was placed in the chamber which was compressed with 100% oxygen at a rate of 2 psi/min after confirming normal ear equalization. He tolerated the treatment and subsequent decompression of the chamber at 2 psi/min. He denied any issues with ear equalization and/or pain associated with barotrauma. Post-treatment BP was 139/104 mmHg. Remeasured manually with result of 160/92 mmHg. Post- treatment blood glucose level was 98 mg/dL. Per protocol, patient given 8 oz Glucerna. After 15 minutes patient was asymptomatic for hypoglycemia. He was stable upon discharge. Additional Procedure Documentation Tissue Sevierity: Necrosis of bone [name] Notes No concern with treatment given [name], [name] ([id number]) [id number] Page 2 of 3 Physician HBO Attestation: I certify that I supervised this HBO treatment in accordance with Medicare guidelines. A trained emergency response team is readily available per Yes hospital policies and procedures. Continue HBOT as ordered. Yes Electronic Signature(s) Signed: 01/24/2023 4:19:19 PM By: [name] MD Previous Signature: 01/24/2023 2:48:45 PM Version By: Haywood Pao CHT EMT BS , , Previous Signature: 01/24/2023 1:37:16 PM Version By: Haywood Pao CHT EMT BS , , Entered By: [name] on 01/24/2023 12:57:30 -------------------------------------------------------------------------------- HBO Safety Checklist Details Patient Name: Date of Service: [name] 01/24/2023 12:00 PM Medical Record Number: [id number] Patient Account Number: [id number] Date of  Birth/Sex: Treating RN: 10/23/1933 (89 y.o. [name] Primary Care Lavinia Mcneely: [name] Other Clinician: Haywood Pao Referring Hershall Benkert: Treating Tyne Banta/Extender: [name] in Treatment: 29 HBO Safety Checklist Items Safety Checklist Consent Form Signed Patient voided / foley  secured and emptied When did you last eato 0900- Meal Last dose of injectable or oral agent 0800 Ostomy pouch emptied and vented if applicable NA All implantable devices assessed, documented and approved NA Intravenous access site secured and place NA Valuables secured Linens and cotton and cotton/polyester blend (less than 51% polyester) Personal oil-based products / skin lotions / body lotions removed Wigs or hairpieces removed NA Smoking or tobacco materials removed NA Books / newspapers / magazines / loose paper removed Cologne, aftershave, perfume and deodorant removed Jewelry removed (may wrap wedding band) Make-up removed NA Hair care products removed NA Battery operated devices (external) removed NA Heating patches and chemical warmers removed NA Titanium eyewear removed NA Nail polish cured greater than 10 hours NA Casting material cured greater than 10 hours NA Hearing aids removed removed Loose dentures or partials removed dentures removed Prosthetics have been removed NA Patient demonstrates correct use of air break device (if applicable) Patient concerns have been addressed Patient grounding bracelet on and cord attached to chamber Specifics for Inpatients (complete in addition to above) Medication sheet sent with patient NA Intravenous medications needed or due during therapy sent with patient NA Drainage tubes (e.g. nasogastric tube or chest tube secured and vented) NA Endotracheal or Tracheotomy tube secured NA Cuff deflated of air and inflated with saline NA Airway suctioned NA Notes Paper version used prior to  treatment start. [name], [name] ([id number]) [id number] Page 3 of 3 Electronic Signature(s) Signed: 01/24/2023 1:33:47 PM By: Haywood Pao CHT EMT BS , , Entered By: Haywood Pao on 01/24/2023 10:33:47

## 2023-01-24 NOTE — Progress Notes (Signed)
MCCOIG, Lysle ([id number]) [id number] Page 1 of 2 Visit Report for 01/24/2023 Arrival Information Details Patient Name: Date of Service: [name] [name] 01/24/2023 12:00 PM Medical Record Number: [id number] Patient Account Number: [id number] Date of Birth/Sex: Treating RN: [birth year] (89 y.o. [name] Primary Care Kaleya Douse: [name] Other Clinician: Haywood Pao Referring Hattye [name]: Treating Johnn Krasowski/Extender: [name] in Treatment: 29 Visit Information History Since Last Visit All ordered tests and consults were completed: Yes Patient Arrived: Ambulatory Added or deleted any medications: No Arrival Time: 12:05 Any new allergies or adverse reactions: No Accompanied By: self Had a fall or experienced change in No Transfer Assistance: None activities of daily living that may affect Patient Identification Verified: Yes risk of falls: Secondary Verification Process Completed: Yes Signs or symptoms of abuse/neglect since last visito No Patient Requires Transmission-Based Precautions: No Hospitalized since last visit: No Patient Has Alerts: Yes Implantable device outside of the clinic excluding No Patient Alerts: Patient on Blood Thinner cellular tissue based products placed in the center since last visit: Pain Present Now: No Electronic Signature(s) Signed: 01/24/2023 1:32:02 PM By: Haywood Pao CHT EMT BS , , Entered By: Haywood Pao on 01/24/2023 10:32:02 -------------------------------------------------------------------------------- Encounter Discharge Information Details Patient Name: Date of Service: [name] [name] 01/24/2023 12:00 PM Medical Record Number: [id number] Patient Account Number: [id number] Date of Birth/Sex: Treating RN: [birth year] (89 y.o. [name] Primary Care Lillianah Swartzentruber: [name] Other Clinician: Haywood Pao Referring  Modupe Shampine: Treating Terrin Imparato/Extender: [name] in Treatment: 29 Encounter Discharge Information Items Discharge Condition: Stable Ambulatory Status: Ambulatory Discharge Destination: Home Transportation: Private Auto Accompanied By: self Schedule Follow-up Appointment: No Clinical Summary of Care: Electronic Signature(s) Signed: 01/24/2023 2:50:21 PM By: Haywood Pao CHT EMT BS , , Entered By: Haywood Pao on 01/24/2023 11:50:21 [name] ([id number]) [id number] Page 2 of 2 -------------------------------------------------------------------------------- Vitals Details Patient Name: Date of Service: [name] [name] 01/24/2023 12:00 PM Medical Record Number: [id number] Patient Account Number: [id number] Date of Birth/Sex: Treating RN: 1933/03/29 (89 y.o. [name] Primary Care Angalena Cousineau: [name] Other Clinician: Haywood Pao Referring Areej Tayler: Treating Jamarian Jacinto/Extender: [name] in Treatment: 29 Vital Signs Time Taken: 12:18 Temperature (F): 98.1 Height (in): 71 Pulse (bpm): 108 Weight (lbs): 170 Respiratory Rate (breaths/min): 18 Body Mass Index (BMI): 23.7 Blood Pressure (mmHg): 133/79 Reference Range: 80 - 120 mg / dl Electronic Signature(s) Signed: 01/24/2023 1:32:24 PM By: Haywood Pao CHT EMT BS , , Entered By: Haywood Pao on 01/24/2023 10:32:24

## 2023-01-24 NOTE — Progress Notes (Signed)
GRAHAM, Morad ([id number]) [id number] Page 1 of 1 Visit Report for 01/24/2023 SuperBill Details Patient Name: Date of Service: [name] 01/24/2023 Medical Record Number: [id number] Patient Account Number: [id number] Date of Birth/Sex: Treating RN: Oct 23, 1933 (89 y.o. 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 E11.621 Type 2 diabetes mellitus with foot ulcer I70.235 Atherosclerosis of native arteries of right leg with ulceration of other part of foot Facility Procedures CPT4 Code Description Modifier Quantity [id number]-(Facility Use Only) HBOT full body chamber, , 4 ICD-10 Diagnosis Description E11.621 Type 2 diabetes mellitus with foot ulcer 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.512 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.235 Atherosclerosis of native arteries of right leg with ulceration of other part of foot Physician Procedures Quantity CPT4 Code Description Modifier [id number] - WC PHYS HYPERBARIC OXYGEN THERAPY 1 ICD-10 Diagnosis Description E11.621 Type 2 diabetes mellitus with foot ulcer 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

## 2023-01-24 NOTE — Progress Notes (Signed)
HEICHEL, Alejandro ([id number]) [id number] Page 1 of 10 Visit Report for 01/23/2023 Arrival Information Details Patient Name: Date of Service: [name] [name] 01/23/2023 2:45 PM Medical Record Number: [id number] Patient Account Number: [id number] Date of Birth/Sex: Treating RN: 1933-10-20 (89 y.o. M) Primary Care Rebecah Dangerfield: [name] Other Clinician: Referring Shajuan Musso: Treating Hanz Winterhalter/Extender: [name] in Treatment: 29 Visit Information History Since Last Visit Added or deleted any medications: No Patient Arrived: Ambulatory Any new allergies or adverse reactions: No Arrival Time: 14:57 Had a fall or experienced change in No Accompanied By: self activities of daily living that may affect Transfer Assistance: None risk of falls: Patient Identification Verified: Yes Signs or symptoms of abuse/neglect since last visito No Secondary Verification Process Completed: Yes Hospitalized since last visit: No Patient Requires Transmission-Based Precautions: No Implantable device outside of the clinic excluding No Patient Has Alerts: Yes cellular tissue based products placed in the center Patient Alerts: Patient on Blood Thinner since last visit: Has Dressing in Place as Prescribed: Yes Pain Present Now: No Electronic Signature(s) Signed: 01/23/2023 4:25:57 PM By: Thayer Dallas Entered By: Thayer Dallas on 01/23/2023 12:05:36 -------------------------------------------------------------------------------- Encounter Discharge Information Details Patient Name: Date of Service: [name] [name] 01/23/2023 2:45 PM Medical Record Number: [id number] Patient Account Number: [id number] Date of Birth/Sex: Treating RN: 09-09-1933 (89 y.o. [name] Primary Care Georgeann Brinkman: [name] Other Clinician: Referring Rigoberto Repass: Treating Dalyah Pla/Extender: [name] in Treatment:  29 Encounter Discharge Information Items Post Procedure Vitals Discharge Condition: Stable Unable to obtain vitals Reason: see vital signs tab. Ambulatory Status: Ambulatory Discharge Destination: Home Transportation: Private Auto Accompanied By: daughter Schedule Follow-up Appointment: Yes Clinical Summary of Care: Electronic Signature(s) Signed: 01/23/2023 5:09:00 PM By: Shawn Stall RN, BSN Entered By: Shawn Stall on 01/23/2023 12:32:48 Rosencrans, Carmel ([id number]) [id number] Page 2 of 10 -------------------------------------------------------------------------------- Lower Extremity Assessment Details Patient Name: Date of Service: [name] [name] 01/23/2023 2:45 PM Medical Record Number: [id number] Patient Account Number: [id number] Date of Birth/Sex: Treating RN: 04/15/1933 (89 y.o. M) Primary Care Cleophus Mendonsa: [name] Other Clinician: Referring Zalia Hautala: Treating Cici Rodriges/Extender: [name] in Treatment: 29 Edema Assessment Assessed: [name]: No] Franne Forts: No] Edema: [Left: N] [Right: o] Calf Left: Right: Point of Measurement: From Medial Instep 31.2 cm Ankle Left: Right: Point of Measurement: From Medial Instep 21.4 cm Vascular Assessment Extremity colors, hair growth, and conditions: Extremity Color: [Right:Normal] Hair Growth on Extremity: [Right:No] Temperature of Extremity: [Right:Warm] Capillary Refill: [Right:< 3 seconds] Dependent Rubor: [Right:No No] Electronic Signature(s) Signed: 01/23/2023 4:25:57 PM By: Thayer Dallas Entered By: Thayer Dallas on 01/23/2023 12:06:17 -------------------------------------------------------------------------------- Multi Wound Chart Details Patient Name: Date of Service: [name] [name] 01/23/2023 2:45 PM Medical Record Number: [id number] Patient Account Number: [id number] Date of Birth/Sex: Treating RN: 1933/10/10 (89 y.o. M) Primary Care  Jawann Urbani: [name] Other Clinician: Referring Quinterius Gaida: Treating Aashika Carta/Extender: [name] in Treatment: 29 [3:Photos:] [4:No Photos] [N/A:N/A] Right Amputation Site - Toe Right, Lateral Foot N/A Wound Location: Gradually Appeared Shear/Friction N/A Wounding Event: Diabetic Wound/Ulcer of the Lower Diabetic Wound/Ulcer of the Lower N/A Primary Etiology: ALIAS, GRAHN ([id number]) [id number] Page 3 of 10 Extremity Extremity N/A Pressure Ulcer N/A Secondary Etiology: Cataracts, Anemia, Asthma, Chronic Cataracts, Anemia, Asthma, Chronic N/A Comorbid History: Obstructive Pulmonary Disease Obstructive Pulmonary Disease (COPD), Peripheral Arterial Disease, (COPD), Peripheral Arterial Disease, Type II Diabetes, Gout, Osteoarthritis, Type II Diabetes, Gout, Osteoarthritis, Neuropathy Neuropathy 04/07/2022 11/21/2022 N/A Date Acquired: 45  7 N/A Weeks of Treatment: Open Open N/A Wound Status: No No N/A Wound Recurrence: 0.2x0.2x0.1 2.7x2.3x0.4 N/A Measurements L x W x D (cm) 0.031 4.877 N/A A (cm) : rea 0.003 1.951 N/A Volume (cm) : 99.00% 5.90% N/A % Reduction in A rea: 99.70% -276.60% N/A % Reduction in Volume: Grade 3 Grade 2 N/A Classification: Medium Medium N/A Exudate A mount: Serosanguineous Serosanguineous N/A Exudate Type: red, brown red, brown N/A Exudate Color: Epibole Distinct, outline attached N/A Wound Margin: None Present (0%) Small (1-33%) N/A Granulation A mount: Large (67-100%) Large (67-100%) N/A Necrotic A mount: Adherent Slough Eschar, Adherent Slough N/A Necrotic Tissue: Fat Layer (Subcutaneous Tissue): Yes Fat Layer (Subcutaneous Tissue): Yes N/A Exposed Structures: Fascia: No Bone: Yes Tendon: No Fascia: No Muscle: No Tendon: No Joint: No Muscle: No Bone: No Joint: No Medium (34-66%) Small (1-33%) N/A Epithelialization: N/A Debridement - Excisional  N/A Debridement: Pre-procedure Verification/Time Out N/A 15:20 N/A Taken: N/A Lidocaine 4% Topical Solution N/A Pain Control: N/A Subcutaneous, Slough N/A Tissue Debrided: N/A Skin/Subcutaneous Tissue N/A Level: N/A 4.87 N/A Debridement A (sq cm): rea N/A Curette N/A Instrument: N/A Minimum N/A Bleeding: N/A Pressure N/A Hemostasis A chieved: N/A 0 N/A Procedural Pain: N/A 0 N/A Post Procedural Pain: N/A Procedure was tolerated well N/A Debridement Treatment Response: N/A 2.7x2.3x0.4 N/A Post Debridement Measurements L x W x D (cm) N/A 1.951 N/A Post Debridement Volume: (cm) Excoriation: No Excoriation: No N/A Periwound Skin Texture: Induration: No Induration: No Callus: No Callus: No Crepitus: No Crepitus: No Rash: No Rash: No Scarring: No Scarring: No Maceration: Yes Maceration: Yes N/A Periwound Skin Moisture: Dry/Scaly: No Dry/Scaly: No Atrophie Blanche: No Atrophie Blanche: No N/A Periwound Skin Color: Cyanosis: No Cyanosis: No Ecchymosis: No Ecchymosis: No Erythema: No Erythema: No Hemosiderin Staining: No Hemosiderin Staining: No Mottled: No Mottled: No Pallor: No Pallor: No Rubor: No Rubor: No No Abnormality No Abnormality N/A Temperature: N/A Debridement N/A Procedures Performed: Treatment Notes Wound #3 (Amputation Site - Toe) Wound Laterality: Right Cleanser Vashe 5.8 (oz) Discharge Instruction: Cleanse the wound with Vashe prior to applying a clean dressing using gauze sponges, not tissue or cotton balls. Peri-Wound Care Topical Skintegrity Hydrogel 4 (oz) Discharge Instruction: Apply hydrogel as directed Primary Dressing Endoform 2x2 in Discharge Instruction: Moisten with saline HERTZOG, Pistol ([id number]) [id number].pdf Page 4 of 10 Secondary Dressing Woven Gauze Sponge, Non-Sterile 4x4 in Discharge Instruction: Apply over primary dressing as directed. Secured With Paper Tape, 2x10  (in/yd) Discharge Instruction: Secure dressing with tape as directed. Compression Wrap Compression Stockings Add-Ons Wound #4 (Foot) Wound Laterality: Right, Lateral Cleanser Soap and Water Discharge Instruction: May shower and wash wound with dial antibacterial soap and water prior to dressing change. Wound Cleanser Discharge Instruction: Cleanse the wound with wound cleanser prior to applying a clean dressing using gauze sponges, not tissue or cotton balls. Peri-Wound Care Skin Prep Discharge Instruction: Use skin prep as directed Topical Primary Dressing Maxorb Extra Ag+ Alginate Dressing, 2x2 (in/in) Discharge Instruction: Apply over the santyl as instructed Santyl Ointment Discharge Instruction: Apply nickel thick amount to wound bed as instructed Secondary Dressing Optifoam Non-Adhesive Dressing, 4x4 in Discharge Instruction: Apply foam donut. Woven Gauze Sponges 2x2 in Discharge Instruction: Apply over primary dressing as directed. Secured With Conforming Stretch Gauze Bandage, Sterile 2x75 (in/in) Discharge Instruction: Secure with stretch gauze as directed. 32M Medipore H Soft Cloth Surgical T ape, 4 x 10 (in/yd) Discharge Instruction: Secure with tape as directed. Compression Wrap Compression Stockings Add-Ons Electronic Signature(s) Signed: 01/23/2023  4:31:26 PM By: [name] MD Entered By: [name] on 01/23/2023 12:37:27 -------------------------------------------------------------------------------- Multi-Disciplinary Care Plan Details Patient Name: Date of Service: [name] [name] 01/23/2023 2:45 PM Medical Record Number: [id number] Patient Account Number: [id number] Date of Birth/Sex: Treating RN: [birth year] (89 y.o. [name] Primary Care Jacquese Hackman: [name] Other Clinician: Referring Chaise Mahabir: Treating Krystalle Pilkington/Extender: [name] in Treatment: [address], [place] ([id number])  [id number] Page 5 of 10 Active Inactive Wound/Skin Impairment Nursing Diagnoses: Impaired tissue integrity Knowledge deficit related to ulceration/compromised skin integrity Goals: Patient will have a decrease in wound volume by X% from date: (specify in notes) Date Initiated: 07/04/2022 Target Resolution Date: 02/04/2023 Goal Status: Active Patient/caregiver will verbalize understanding of skin care regimen Date Initiated: 07/04/2022 Target Resolution Date: 02/04/2023 Goal Status: Active Ulcer/skin breakdown will have a volume reduction of 30% by week 4 Date Initiated: 07/04/2022 Date Inactivated: 10/27/2022 Target Resolution Date: 11/05/2022 Goal Status: Met Ulcer/skin breakdown will have a volume reduction of 50% by week 8 Date Initiated: 07/04/2022 Date Inactivated: 10/27/2022 Target Resolution Date: 11/05/2022 Goal Status: Met Interventions: Assess patient/caregiver ability to obtain necessary supplies Assess patient/caregiver ability to perform ulcer/skin care regimen upon admission and as needed Assess ulceration(s) every visit Notes: Electronic Signature(s) Signed: 01/23/2023 5:09:00 PM By: Shawn Stall RN, BSN Entered By: Shawn Stall on 01/23/2023 12:12:14 -------------------------------------------------------------------------------- Pain Assessment Details Patient Name: Date of Service: [name] [name] 01/23/2023 2:45 PM Medical Record Number: [id number] Patient Account Number: [id number] Date of Birth/Sex: Treating RN: Jul 16, 1933 (89 y.o. M) Primary Care Miquela Costabile: [name] Other Clinician: Referring Beyounce Dickens: Treating Naleigha Raimondi/Extender: [name] in Treatment: 29 Active Problems Location of Pain Severity and Description of Pain Patient Has Paino No Site Locations [place], [place] ([id number]) [id number] Page 6 of 10 Pain Management and Medication Current Pain  Management: Electronic Signature(s) Signed: 01/23/2023 4:25:57 PM By: Thayer Dallas Entered By: Thayer Dallas on 01/23/2023 12:06:02 -------------------------------------------------------------------------------- Patient/Caregiver Education Details Patient Name: Date of Service: [name] [name] 11/18/2024andnbsp2:45 PM Medical Record Number: [id number] Patient Account Number: [id number] Date of Birth/Gender: Treating RN: 31-Mar-1933 (89 y.o. [name] Primary Care Physician: [name] Other Clinician: Referring Physician: Treating Physician/Extender: [name] in Treatment: 29 Education Assessment Education Provided To: Patient Education Topics Provided Wound/Skin Impairment: Handouts: Caring for Your Ulcer Methods: Explain/Verbal Responses: Reinforcements needed Electronic Signature(s) Signed: 01/23/2023 5:09:00 PM By: Shawn Stall RN, BSN Entered By: Shawn Stall on 01/23/2023 12:12:25 -------------------------------------------------------------------------------- Wound Assessment Details Patient Name: Date of Service: [name] [name] 01/23/2023 2:45 PM Medical Record Number: [id number] Patient Account Number: [id number] Date of Birth/Sex: Treating RN: 1933-11-22 (89 y.o. M) Primary Care Hiliary Osorto: [name] Other Clinician: Referring Romar Woodrick: Treating Shavonta Gossen/Extender: [name] in Treatment: 29 Wound Status Wound Number: 3 Primary Diabetic Wound/Ulcer of the Lower Extremity Etiology: Wound Location: Right Amputation Site - Toe Wound Open Wounding Event: Gradually Appeared Status: Date Acquired: 04/07/2022 Comorbid Cataracts, Anemia, Asthma, Chronic Obstructive Pulmonary Weeks Of Treatment: 29 History: Disease (COPD), Peripheral Arterial Disease, Type II Diabetes, Clustered Wound: No Gout, Osteoarthritis, Neuropathy Photos TAUFA, Malick ([id number])  [id number] Page 7 of 10 Wound Measurements Length: (cm) 0.2 Width: (cm) 0.2 Depth: (cm) 0.1 Area: (cm) 0.031 Volume: (cm) 0.003 % Reduction in Area: 99% % Reduction in Volume: 99.7% Epithelialization: Medium (34-66%) Tunneling: No Undermining: No Wound Description Classification: Grade 3 Wound Margin: Epibole Exudate Amount: Medium Exudate Type: Serosanguineous Exudate Color: red, brown Foul Odor After  Cleansing: No Slough/Fibrino No Wound Bed Granulation Amount: None Present (0%) Exposed Structure Necrotic Amount: Large (67-100%) Fascia Exposed: No Necrotic Quality: Adherent Slough Fat Layer (Subcutaneous Tissue) Exposed: Yes Tendon Exposed: No Muscle Exposed: No Joint Exposed: No Bone Exposed: No Periwound Skin Texture Texture Color No Abnormalities Noted: No No Abnormalities Noted: No Callus: No Atrophie Blanche: No Crepitus: No Cyanosis: No Excoriation: No Ecchymosis: No Induration: No Erythema: No Rash: No Hemosiderin Staining: No Scarring: No Mottled: No Pallor: No Moisture Rubor: No No Abnormalities Noted: No Dry / Scaly: No Temperature / Pain Maceration: Yes Temperature: No Abnormality Treatment Notes Wound #3 (Amputation Site - Toe) Wound Laterality: Right Cleanser Vashe 5.8 (oz) Discharge Instruction: Cleanse the wound with Vashe prior to applying a clean dressing using gauze sponges, not tissue or cotton balls. Peri-Wound Care Topical Skintegrity Hydrogel 4 (oz) Discharge Instruction: Apply hydrogel as directed Primary Dressing Endoform 2x2 in Discharge Instruction: Moisten with saline Secondary Dressing Woven Gauze Sponge, Non-Sterile 4x4 in Discharge Instruction: Apply over primary dressing as directed. Secured With Paper Tape, 2x10 (in/yd) Meigs, Ahmir ([id number]) [phone].pdf Page 8 of 10 Discharge Instruction: Secure dressing with tape as directed. Compression  Wrap Compression Stockings Add-Ons Electronic Signature(s) Signed: 01/23/2023 4:25:57 PM By: Thayer Dallas Entered By: Thayer Dallas on 01/23/2023 12:11:07 -------------------------------------------------------------------------------- Wound Assessment Details Patient Name: Date of Service: [name] [name] 01/23/2023 2:45 PM Medical Record Number: [id number] Patient Account Number: [id number] Date of Birth/Sex: Treating RN: 1933-06-11 (89 y.o. M) Primary Care Savaya Hakes: [name] Other Clinician: Referring Bryanah Sidell: Treating Adria Costley/Extender: [name] in Treatment: 29 Wound Status Wound Number: 4 Primary Diabetic Wound/Ulcer of the Lower Extremity Etiology: Wound Location: Right, Lateral Foot Secondary Pressure Ulcer Wounding Event: Shear/Friction Etiology: Date Acquired: 11/21/2022 Wound Open Weeks Of Treatment: 7 Status: Clustered Wound: No Comorbid Cataracts, Anemia, Asthma, Chronic Obstructive Pulmonary History: Disease (COPD), Peripheral Arterial Disease, Type II Diabetes, Gout, Osteoarthritis, Neuropathy Wound Measurements Length: (cm) 2.7 Width: (cm) 2.3 Depth: (cm) 0.4 Area: (cm) 4.877 Volume: (cm) 1.951 % Reduction in Area: 5.9% % Reduction in Volume: -276.6% Epithelialization: Small (1-33%) Tunneling: No Undermining: No Wound Description Classification: Grade 2 Wound Margin: Distinct, outline attached Exudate Amount: Medium Exudate Type: Serosanguineous Exudate Color: red, brown Foul Odor After Cleansing: No Slough/Fibrino No Wound Bed Granulation Amount: Small (1-33%) Exposed Structure Necrotic Amount: Large (67-100%) Fascia Exposed: No Necrotic Quality: Eschar, Adherent Slough Fat Layer (Subcutaneous Tissue) Exposed: Yes Tendon Exposed: No Muscle Exposed: No Joint Exposed: No Bone Exposed: Yes Periwound Skin Texture Texture Color No Abnormalities Noted: No No Abnormalities Noted: No Callus:  No Atrophie Blanche: No Crepitus: No Cyanosis: No Excoriation: No Ecchymosis: No Induration: No Erythema: No Rash: No Hemosiderin Staining: No Scarring: No Mottled: No Pallor: No Moisture Rubor: No No Abnormalities Noted: No Dry / Scaly: No Temperature / Pain Aldana, Faheem ([id number]) [id number] Page 9 of 10 Maceration: Yes Temperature: No Abnormality Treatment Notes Wound #4 (Foot) Wound Laterality: Right, Lateral Cleanser Soap and Water Discharge Instruction: May shower and wash wound with dial antibacterial soap and water prior to dressing change. Wound Cleanser Discharge Instruction: Cleanse the wound with wound cleanser prior to applying a clean dressing using gauze sponges, not tissue or cotton balls. Peri-Wound Care Skin Prep Discharge Instruction: Use skin prep as directed Topical Primary Dressing Maxorb Extra Ag+ Alginate Dressing, 2x2 (in/in) Discharge Instruction: Apply over the santyl as instructed Santyl Ointment Discharge Instruction: Apply nickel thick amount to wound bed as instructed Secondary Dressing Optifoam Non-Adhesive Dressing,  4x4 in Discharge Instruction: Apply foam donut. Woven Gauze Sponges 2x2 in Discharge Instruction: Apply over primary dressing as directed. Secured With Conforming Stretch Gauze Bandage, Sterile 2x75 (in/in) Discharge Instruction: Secure with stretch gauze as directed. 41M Medipore H Soft Cloth Surgical T ape, 4 x 10 (in/yd) Discharge Instruction: Secure with tape as directed. Compression Wrap Compression Stockings Add-Ons Electronic Signature(s) Signed: 01/23/2023 5:09:00 PM By: Shawn Stall RN, BSN Entered By: Shawn Stall on 01/23/2023 12:28:22 -------------------------------------------------------------------------------- Vitals Details Patient Name: Date of Service: [name] [name] 01/23/2023 2:45 PM Medical Record Number: [id number] Patient Account Number:  [id number] Date of Birth/Sex: Treating RN: [birth year] (89 y.o. M) Primary Care Giavonna Pflum: [name] Other Clinician: Referring Neomia Herbel: Treating Vi Whitesel/Extender: [name] in Treatment: 29 Vital Signs Time Taken: 15:05 Reference Range: 80 - 120 mg / dl Height (in): 71 Weight (lbs): 170 Body Mass Index (BMI): 23.7 Notes VS done in [facility] Schwalbe, Jahmil ([id number]) [phone].pdf Page 10 of 10 Electronic Signature(s) Signed: 01/23/2023 4:25:57 PM By: Thayer Dallas Entered By: Thayer Dallas on 01/23/2023 12:05:55

## 2023-01-25 ENCOUNTER — Encounter (HOSPITAL_BASED_OUTPATIENT_CLINIC_OR_DEPARTMENT_OTHER): Payer: PPO | Admitting: Internal Medicine

## 2023-01-25 DIAGNOSIS — E11621 Type 2 diabetes mellitus with foot ulcer: Secondary | ICD-10-CM | POA: Diagnosis not present

## 2023-01-25 LAB — GLUCOSE, CAPILLARY
Glucose-Capillary: 210 mg/dL — ABNORMAL HIGH (ref 70–99)
Glucose-Capillary: 174 mg/dL — ABNORMAL HIGH (ref 70–99)

## 2023-01-25 NOTE — Progress Notes (Signed)
HOESE, Rajon ([id number]) [id number] Page 1 of 2 Visit Report for 01/25/2023 HBO Details Patient Name: Date of Service: [name] 01/25/2023 11:30 A M Medical Record Number: [id number] Patient Account Number: [id number] Date of Birth/Sex: Treating RN: [birth year] (89 y.o. [name] Primary Care Kazuki Ingle: [name] Other Clinician: Daryll Brod Referring Clois Treanor: Treating Lubertha Leite/Extender: [name] in Treatment: 29 HBO Treatment Course Details Treatment Course Number: 1 Ordering Anberlyn Feimster: [name] T Treatments Ordered: otal 40 HBO Treatment Start Date: 01/09/2023 HBO Indication: Diabetic Ulcer(s) of the Lower Extremity Standard/Conservative Wound Care tried and failed greater than or equal to 30 days Wound #3 Right Amputation Site - T oe HBO Treatment Details Treatment Number: 13 Patient Type: Outpatient Chamber Type: Monoplace Chamber Serial #: [id number] Treatment Protocol: 2.0 ATA with 90 minutes oxygen, and no air breaks Treatment Details Compression Rate Down: 2.0 psi / minute De-Compression Rate Up: 2.0 psi / minute Air breaks and breathing Decompress Decompress Compress Tx Pressure Begins Reached periods Begins Ends (leave unused spaces blank) Chamber Pressure (ATA 1 2 ------2 1 ) Clock Time (24 hr) 12:28 12:39 - - - - - - 14:10 14:17 Treatment Length: 109 (minutes) Treatment Segments: 4 Vital Signs Capillary Blood Glucose Reference Range: 80 - 120 mg / dl HBO Diabetic Blood Glucose Intervention Range: <131 mg/dl or >086 mg/dl Time Vitals Blood Respiratory Capillary Blood Glucose Pulse Action Type: Pulse: Temperature: Taken: Pressure: Rate: Glucose (mg/dl): Meter #: Oximetry (%) Taken: Pre 12:20 136/78 123 18 97.3 210 1 Post 14:17 152/90 88 18 174 1 Treatment Response Treatment Completion Status: Treatment Completed without Adverse Event Treatment Notes I  certify that I directed and performed operation of said chamber for this treatment. MScammell Mr. Salib arrived with apparent heart rate of 123 bpm. Manual count 92 bpm. After performing a safety check, patient was placed in the chamber which was compressed with 100% oxygen at the rate of 1.5 psi/min after confirming normal ear equalization. He tolerated the treatment and subsequent decompression of the chamber at the rate of 1.5 psi/min. Post-treatment vital signs were within normal range. He was stable upon discharge. Additional Procedure Documentation Tissue Sevierity: Necrosis of bone Jahnessa Vanduyn Notes No concerns with treatment given Physician HBO Attestation: I certify that I supervised this HBO treatment in accordance with Medicare guidelines. A trained emergency response team is readily available per Yes MARDIROSSIAN, Cloud ([id number]) [id number] Page 2 of 2 hospital policies and procedures. Continue HBOT as ordered. Yes Electronic Signature(s) Signed: 01/25/2023 4:37:54 PM By: [name] MD Previous Signature: 01/25/2023 3:53:05 PM Version By: Haywood Pao CHT EMT BS , , Previous Signature: 01/25/2023 2:42:57 PM Version By: Demetria Pore Entered By: [name] on 01/25/2023 16:37:09 -------------------------------------------------------------------------------- HBO Safety Checklist Details Patient Name: Date of Service: [name] 01/25/2023 11:30 A M Medical Record Number: [id number] Patient Account Number: [id number] Date of Birth/Sex: Treating RN: 09-21-1933 (89 y.o. [name], [name] Primary Care Celia Gibbons: [name] Other Clinician: Daryll Brod Referring Domanik Rainville: Treating Hiram Mciver/Extender: [name] in Treatment: 29 HBO Safety Checklist Items Safety Checklist Consent Form Signed Patient voided / foley secured and emptied When did you last eato 9:30 am bacon, eggs, coffee Last  dose of injectable or oral agent 9:00 am Ostomy pouch emptied and vented if applicable NA All implantable devices assessed, documented and approved NA Intravenous access site secured and place NA Valuables secured Linens and cotton and cotton/polyester blend (less than 51% polyester) Personal oil-based products /  skin lotions / body lotions removed Wigs or hairpieces removed NA Smoking or tobacco materials removed NA Books / newspapers / magazines / loose paper removed Cologne, aftershave, perfume and deodorant removed Jewelry removed (may wrap wedding band) Make-up removed NA Hair care products removed Battery operated devices (external) removed NA Heating patches and chemical warmers removed NA Titanium eyewear removed NA Nail polish cured greater than 10 hours NA Casting material cured greater than 10 hours NA Hearing aids removed Loose dentures or partials removed NA Prosthetics have been removed NA Patient demonstrates correct use of air break device (if applicable) Patient concerns have been addressed Patient grounding bracelet on and cord attached to chamber Specifics for Inpatients (complete in addition to above) Medication sheet sent with patient NA Intravenous medications needed or due during therapy sent with patient NA Drainage tubes (e.g. nasogastric tube or chest tube secured and vented) NA Endotracheal or Tracheotomy tube secured NA Cuff deflated of air and inflated with saline NA Airway suctioned NA Electronic Signature(s) Signed: 01/25/2023 2:42:57 PM By: Demetria Pore Entered By: Demetria Pore on 01/25/2023 14:39:07

## 2023-01-25 NOTE — Progress Notes (Signed)
[name], [name] ([id number]) [id number] Page 1 of 2 Visit Report for 01/25/2023 Arrival Information Details Patient Name: Date of Service: [name] [name] 01/25/2023 11:30 A M Medical Record Number: [id number] Patient Account Number: [id number] Date of Birth/Sex: Treating RN: May 31, 1933 (89 y.o. [name] Primary Care Jedrek Dinovo: [name] Other Clinician: Daryll Brod Referring Aubrey Blackard: Treating Macee Venables/Extender: [name] in Treatment: 29 Visit Information History Since Last Visit Added or deleted any medications: No Patient Arrived: Ambulatory Any new allergies or adverse reactions: No Arrival Time: 12:20 Had a fall or experienced change in No Accompanied By: self activities of daily living that may affect Transfer Assistance: None risk of falls: Patient Identification Verified: Yes Signs or symptoms of abuse/neglect since last visito No Secondary Verification Process Completed: Yes Hospitalized since last visit: No Patient Requires Transmission-Based Precautions: No Implantable device outside of the clinic excluding No Patient Has Alerts: Yes cellular tissue based products placed in the center Patient Alerts: Patient on Blood Thinner since last visit: Pain Present Now: No Electronic Signature(s) Signed: 01/25/2023 2:42:57 PM By: Demetria Pore Entered By: Demetria Pore on 01/25/2023 09:55:14 -------------------------------------------------------------------------------- Encounter Discharge Information Details Patient Name: Date of Service: [name] [name] 01/25/2023 11:30 A M Medical Record Number: [id number] Patient Account Number: [id number] Date of Birth/Sex: Treating RN: [birth year] (89 y.o. [name] Primary Care Nichollas Perusse: [name] Other Clinician: Daryll Brod Referring Bradrick Kamau: Treating Alphonsine Minium/Extender: [name] in Treatment:  29 Encounter Discharge Information Items Discharge Condition: Stable Ambulatory Status: Ambulatory Discharge Destination: Home Transportation: Private Auto Accompanied By: self Schedule Follow-up Appointment: Yes Clinical Summary of Care: Electronic Signature(s) Signed: 01/25/2023 2:42:57 PM By: Demetria Pore Entered By: Demetria Pore on 01/25/2023 11:40:55 [name], [name] ([id number]) [id number] Page 2 of 2 -------------------------------------------------------------------------------- Vitals Details Patient Name: Date of Service: [name] [name] 01/25/2023 11:30 A M Medical Record Number: [id number] Patient Account Number: [id number] Date of Birth/Sex: Treating RN: [birth year] (89 y.o. [name] Primary Care Linnet Bottari: [name] Other Clinician: Daryll Brod Referring Charnae Lill: Treating Kynleigh Artz/Extender: [name] in Treatment: 29 Vital Signs Time Taken: 12:20 Temperature (F): 97.3 Height (in): 71 Pulse (bpm): 123 Weight (lbs): 170 Respiratory Rate (breaths/min): 18 Body Mass Index (BMI): 23.7 Blood Pressure (mmHg): 136/78 Capillary Blood Glucose (mg/dl): 433 Reference Range: 80 - 120 mg / dl Electronic Signature(s) Signed: 01/25/2023 2:42:57 PM By: Demetria Pore Entered By: Demetria Pore on 01/25/2023 09:55:56

## 2023-01-25 NOTE — Progress Notes (Signed)
LEVENGOOD, Kaylub ([id number]) [id number] Page 1 of 1 Visit Report for 01/25/2023 SuperBill Details Patient Name: Date of Service: [name] [name] 01/25/2023 Medical Record Number: [id number] Patient Account Number: [id number] Date of Birth/Sex: Treating RN: 22-Nov-1933 (89 y.o. 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 E11.621 Type 2 diabetes mellitus with foot ulcer I70.235 Atherosclerosis of native arteries of right leg with ulceration of other part of foot Facility Procedures CPT4 Code Description Modifier Quantity [id number] G0277-(Facility Use Only) HBOT full body chamber, , 4 Physician Procedures Quantity CPT4 Code Description Modifier [id number] 99183 - WC PHYS HYPERBARIC OXYGEN THERAPY 1 ICD-10 Diagnosis Description L97.512 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

## 2023-01-26 ENCOUNTER — Encounter (HOSPITAL_BASED_OUTPATIENT_CLINIC_OR_DEPARTMENT_OTHER): Payer: PPO | Admitting: General Surgery

## 2023-01-26 DIAGNOSIS — E11621 Type 2 diabetes mellitus with foot ulcer: Secondary | ICD-10-CM | POA: Diagnosis not present

## 2023-01-26 LAB — GLUCOSE, CAPILLARY
Glucose-Capillary: 221 mg/dL — ABNORMAL HIGH (ref 70–99)
Glucose-Capillary: 146 mg/dL — ABNORMAL HIGH (ref 70–99)

## 2023-01-27 ENCOUNTER — Encounter (HOSPITAL_BASED_OUTPATIENT_CLINIC_OR_DEPARTMENT_OTHER): Payer: PPO | Admitting: General Surgery

## 2023-01-27 DIAGNOSIS — E11621 Type 2 diabetes mellitus with foot ulcer: Secondary | ICD-10-CM | POA: Diagnosis not present

## 2023-01-27 LAB — GLUCOSE, CAPILLARY
Glucose-Capillary: 172 mg/dL — ABNORMAL HIGH (ref 70–99)
Glucose-Capillary: 213 mg/dL — ABNORMAL HIGH (ref 70–99)

## 2023-01-27 NOTE — Progress Notes (Signed)
HUSSER, Jayquon ([id number]) [id number] Page 1 of 1 Visit Report for 01/27/2023 SuperBill Details Patient Name: Date of Service: [name] [name] 01/27/2023 Medical Record Number: [id number] Patient Account Number: [id number] Date of Birth/Sex: Treating RN: [birth year] (89 y.o. [name], [name] Primary Care Provider: [name] Other Clinician: Daryll Brod Referring Provider: Treating Provider/Extender: Tonye Pearson in Treatment: 29 Diagnosis Coding ICD-10 Codes Code Description [phone] Non-pressure chronic ulcer of other part of right foot with fat layer exposed L97.514 Non-pressure chronic ulcer of other part of right foot with necrosis of bone E11.621 Type 2 diabetes mellitus with foot ulcer I70.235 Atherosclerosis of native arteries of right leg with ulceration of other part of foot Facility Procedures CPT4 Code Description Modifier Quantity [id number] G0277-(Facility Use Only) HBOT full body chamber, , 4 Physician Procedures Quantity CPT4 Code Description Modifier [id number] 99183 - WC PHYS HYPERBARIC OXYGEN THERAPY 1 ICD-10 Diagnosis Description 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.512 Non-pressure chronic ulcer of other part of right foot with fat layer exposed E11.621 Type 2 diabetes mellitus with foot ulcer I70.235 Atherosclerosis of native arteries of right leg with ulceration of other part of foot Electronic Signature(s) Signed: 01/27/2023 12:13:00 PM By: Demetria Pore Signed: 01/27/2023 12:30:01 PM By: Duanne Guess MD FACS Entered By: Demetria Pore on 01/27/2023 12:11:53

## 2023-01-27 NOTE — Progress Notes (Signed)
KASPAR, Eustace ([id number]) [id number] Page 1 of 2 Visit Report for 01/27/2023 Arrival Information Details Patient Name: Date of Service: [name] 01/27/2023 9:30 A M Medical Record Number: [id number] Patient Account Number: [id number] Date of Birth/Sex: Treating RN: [birth year] (89 y.o. [name] Primary Care Tamakia Porto: [name] Other Clinician: Daryll Brod Referring Sakeenah Valcarcel: Treating Emran Molzahn/Extender: Tonye Pearson in Treatment: 29 Visit Information History Since Last Visit Added or deleted any medications: No Patient Arrived: Ambulatory Any new allergies or adverse reactions: No Arrival Time: 09:40 Had a fall or experienced change in No Accompanied By: self activities of daily living that may affect Transfer Assistance: None risk of falls: Patient Identification Verified: Yes Signs or symptoms of abuse/neglect since last visito No Secondary Verification Process Completed: Yes Hospitalized since last visit: No Patient Requires Transmission-Based Precautions: No Implantable device outside of the clinic excluding No Patient Has Alerts: Yes cellular tissue based products placed in the center Patient Alerts: Patient on Blood Thinner since last visit: Pain Present Now: No Electronic Signature(s) Signed: 01/27/2023 12:13:00 PM By: Demetria Pore Entered By: Demetria Pore on 01/27/2023 11:10:43 -------------------------------------------------------------------------------- Encounter Discharge Information Details Patient Name: Date of Service: [name] 01/27/2023 9:30 A M Medical Record Number: [id number] Patient Account Number: [id number] Date of Birth/Sex: Treating RN: 1933/09/04 (89 y.o. [name] Primary Care Kebin Maye: [name] Other Clinician: Daryll Brod Referring Zabdi Mis: Treating Kenaz Olafson/Extender: Tonye Pearson in Treatment:  29 Encounter Discharge Information Items Discharge Condition: Stable Ambulatory Status: Ambulatory Discharge Destination: Home Transportation: Private Auto Accompanied By: self Schedule Follow-up Appointment: Yes Clinical Summary of Care: Electronic Signature(s) Signed: 01/27/2023 12:13:00 PM By: Francoise Ceo By: Demetria Pore on 01/27/2023 12:12:32 Baugher, Kramer ([id number]) [id number] Page 2 of 2 -------------------------------------------------------------------------------- Vitals Details Patient Name: Date of Service: [name] 01/27/2023 9:30 A M Medical Record Number: [id number] Patient Account Number: [id number] Date of Birth/Sex: Treating RN: 01/02/1934 (89 y.o. [name] Primary Care Tatiyana Foucher: [name] Other Clinician: Daryll Brod Referring Jerome Otter: Treating Jerrianne Hartin/Extender: Tonye Pearson in Treatment: 29 Vital Signs Time Taken: 09:40 Temperature (F): 97.2 Height (in): 71 Pulse (bpm): 118 Weight (lbs): 170 Respiratory Rate (breaths/min): 18 Body Mass Index (BMI): 23.7 Blood Pressure (mmHg): 118/77 Capillary Blood Glucose (mg/dl): 616 Reference Range: 80 - 120 mg / dl Electronic Signature(s) Signed: 01/27/2023 12:13:00 PM By: Demetria Pore Entered By: Demetria Pore on 01/27/2023 11:11:13

## 2023-01-27 NOTE — Progress Notes (Signed)
[name], [name] ([id number]) [id number] Page 1 of 2 Visit Report for 01/26/2023 HBO Details Patient Name: Date of Service: [name] [name] 01/26/2023 11:30 A M Medical Record Number: [id number] Patient Account Number: [id number] Date of Birth/Sex: Treating RN: 09/07/1933 (89 y.o. [name] Primary Care Lorana Maffeo: [name] Other Clinician: Daryll Brod Referring Lizzett Nobile: Treating Samah Lapiana/Extender: Tonye Pearson in Treatment: 29 HBO Treatment Course Details Treatment Course Number: 1 Ordering Verdun Rackley: [name] T Treatments Ordered: otal 40 HBO Treatment Start Date: 01/09/2023 HBO Indication: Diabetic Ulcer(s) of the Lower Extremity Standard/Conservative Wound Care tried and failed greater than or equal to 30 days Wound #3 Right Amputation Site - T oe HBO Treatment Details Treatment Number: 14 Patient Type: Outpatient Chamber Type: Monoplace Chamber Serial #: [id number] Treatment Protocol: 2.0 ATA with 90 minutes oxygen, and no air breaks Treatment Details Compression Rate Down: 2.0 psi / minute De-Compression Rate Up: 2.0 psi / minute Air breaks and breathing Decompress Decompress Compress Tx Pressure Begins Reached periods Begins Ends (leave unused spaces blank) Chamber Pressure (ATA 1 2 ------2 1 ) Clock Time (24 hr) 12:31 12:43 - - - - - - 14:14 14:21 Treatment Length: 110 (minutes) Treatment Segments: 4 Vital Signs Capillary Blood Glucose Reference Range: 80 - 120 mg / dl HBO Diabetic Blood Glucose Intervention Range: <131 mg/dl or >782 mg/dl Type: Time Vitals Blood Respiratory Capillary Blood Glucose Pulse Action Pulse: Temperature: Taken: Pressure: Rate: Glucose (mg/dl): Meter #: Oximetry (%) Taken: Pre 12:20 136/86 118 18 97.2 221 1 Pre 100 manually took pulse Post 14:22 170/11 92 18 97.2 146 1 Treatment Response Treatment Toleration: Well Treatment Completion Status:  Treatment Completed without Adverse Event Treatment Notes Patient pain level 2 ([place]) I certify that I directed and performed operation of said chamber for this treatment. [place] Mr. Omans arrived with apparent heart rate of 118 bpm. Manual count 100 bpm. After performing a safety check, patient was placed in the chamber which was compressed with 100% oxygen at the rate of 1.5 psi/min after confirming normal ear equalization. He tolerated the treatment and subsequent decompression of the chamber at the rate of 1.5 psi/min. He denied issues with ear equalizaiton and/or pain associated with barotrauma. Post-treatment vital signs were within normal range. He was stable upon discharge. Additional Procedure Documentation Tissue Sevierity: Necrosis of bone Physician HBO AttestationBrad Cuna, Aaryav ([id number]) [id number] Page 2 of 2 I certify that I supervised this HBO treatment in accordance with Medicare guidelines. A trained emergency response team is readily available per Yes hospital policies and procedures. Continue HBOT as ordered. Yes Electronic Signature(s) Signed: 01/26/2023 4:49:09 PM By: Duanne Guess MD FACS Previous Signature: 01/26/2023 4:47:25 PM Version By: Haywood Pao CHT EMT BS , , Entered By: Duanne Guess on 01/26/2023 16:49:09 -------------------------------------------------------------------------------- HBO Safety Checklist Details Patient Name: Date of Service: [name] [name] 01/26/2023 11:30 A M Medical Record Number: [id number] Patient Account Number: [id number] Date of Birth/Sex: Treating RN: 11/06/1933 (89 y.o. [name] Primary Care Ferdinando Lodge: [name] Other Clinician: Daryll Brod Referring Anacaren Kohan: Treating Julianny Milstein/Extender: Tonye Pearson in Treatment: 29 HBO Safety Checklist Items Safety Checklist Consent Form Signed Patient voided / foley secured and  emptied When did you last eato 10 am eggs bacon toast Last dose of injectable or oral agent 8:00 Ostomy pouch emptied and vented if applicable NA All implantable devices assessed, documented and approved NA Intravenous access site secured and place NA Valuables secured Linens and cotton and  cotton/polyester blend (less than 51% polyester) Personal oil-based products / skin lotions / body lotions removed Wigs or hairpieces removed NA Smoking or tobacco materials removed NA Books / newspapers / magazines / loose paper removed Cologne, aftershave, perfume and deodorant removed Jewelry removed (may wrap wedding band) Make-up removed NA Hair care products removed Battery operated devices (external) removed NA Heating patches and chemical warmers removed NA Titanium eyewear removed NA Nail polish cured greater than 10 hours NA Casting material cured greater than 10 hours NA Hearing aids removed NA Loose dentures or partials removed NA Prosthetics have been removed NA Patient demonstrates correct use of air break device (if applicable) Patient concerns have been addressed Patient grounding bracelet on and cord attached to chamber Specifics for Inpatients (complete in addition to above) Medication sheet sent with patient NA Intravenous medications needed or due during therapy sent with patient NA Drainage tubes (e.g. nasogastric tube or chest tube secured and vented) NA Endotracheal or Tracheotomy tube secured NA Cuff deflated of air and inflated with saline NA Airway suctioned NA Electronic Signature(s) Signed: 01/27/2023 12:13:00 PM By: Demetria Pore Entered By: Demetria Pore on 01/26/2023 14:19:17

## 2023-01-27 NOTE — Progress Notes (Signed)
JENNISON, Adonnis ([id number]) [id number] Page 1 of 1 Visit Report for 01/26/2023 SuperBill Details Patient Name: Date of Service: [name] 01/26/2023 Medical Record Number: [id number] Patient Account Number: [id number] Date of Birth/Sex: Treating RN: 1933/07/19 (89 y.o. [name] Primary Care Provider: [name] Other Clinician: Daryll Brod Referring Provider: Treating Provider/Extender: Tonye Pearson in Treatment: 29 Diagnosis Coding ICD-10 Codes Code Description [phone] Non-pressure chronic ulcer of other part of right foot with fat layer exposed L97.514 Non-pressure chronic ulcer of other part of right foot with necrosis of bone E11.621 Type 2 diabetes mellitus with foot ulcer I70.235 Atherosclerosis of native arteries of right leg with ulceration of other part of foot Facility Procedures CPT4 Code Description Modifier Quantity [id number] G0277-(Facility Use Only) HBOT full body chamber, , 4 Physician Procedures Quantity CPT4 Code Description Modifier [id number] 99183 - WC PHYS HYPERBARIC OXYGEN THERAPY 1 ICD-10 Diagnosis Description L97.512 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

## 2023-01-27 NOTE — Progress Notes (Signed)
PLEASANTS, Marwin ([id number]) [id number] Page 1 of 2 Visit Report for 01/26/2023 Arrival Information Details Patient Name: Date of Service: [name] [name] 01/26/2023 11:30 A M Medical Record Number: [id number] Patient Account Number: [id number] Date of Birth/Sex: Treating RN: 14-Mar-1933 (89 y.o. [name] Primary Care Shavonta Gossen: [name] Other Clinician: Daryll Brod Referring Jhamir Pickup: Treating Wing Schoch/Extender: Tonye Pearson in Treatment: 29 Visit Information History Since Last Visit Added or deleted any medications: No Patient Arrived: Ambulatory Any new allergies or adverse reactions: No Arrival Time: 12:25 Had a fall or experienced change in No Accompanied By: self activities of daily living that may affect Transfer Assistance: None risk of falls: Patient Identification Verified: Yes Signs or symptoms of abuse/neglect since last visito No Secondary Verification Process Completed: Yes Hospitalized since last visit: No Patient Requires Transmission-Based Precautions: No Pain Present Now: Yes Patient Has Alerts: Yes Patient Alerts: Patient on Blood Thinner Electronic Signature(s) Signed: 01/27/2023 12:13:00 PM By: Demetria Pore Entered By: Demetria Pore on 01/26/2023 14:19:06 -------------------------------------------------------------------------------- Encounter Discharge Information Details Patient Name: Date of Service: [name] [name] 01/26/2023 11:30 A M Medical Record Number: [id number] Patient Account Number: [id number] Date of Birth/Sex: Treating RN: 09-22-1933 (89 y.o. [name] Primary Care Johm Pfannenstiel: [name] Other Clinician: Daryll Brod Referring Rilyn Scroggs: Treating Elyas Villamor/Extender: Tonye Pearson in Treatment: 29 Encounter Discharge Information Items Discharge Condition: Stable Ambulatory Status: Ambulatory Discharge  Destination: Home Transportation: Private Auto Accompanied By: self Schedule Follow-up Appointment: No Clinical Summary of Care: Electronic Signature(s) Signed: 01/27/2023 12:13:00 PM By: Demetria Pore Entered By: Demetria Pore on 01/26/2023 14:33:07 Pain Assessment Details -------------------------------------------------------------------------------- Ellery Plunk ([id number]) [id number] Page 2 of 2 Patient Name: Date of Service: [name] [name] 01/26/2023 11:30 A M Medical Record Number: [id number] Patient Account Number: [id number] Date of Birth/Sex: Treating RN: Oct 31, 1933 (89 y.o. [name] Primary Care Shayann Garbutt: [name] Other Clinician: Daryll Brod Referring Kashus Karlen: Treating Quenisha Lovins/Extender: Tonye Pearson in Treatment: 29 Active Problems Location of Pain Severity and Description of Pain Patient Has Paino Yes Site Locations Rate the pain. Current Pain Level: 2 Pain Management and Medication Current Pain Management: Electronic Signature(s) Signed: 01/26/2023 4:22:51 PM By: Haywood Pao CHT EMT BS , , Signed: 01/26/2023 5:00:36 PM By: Zenaida Deed RN, BSN Entered By: Haywood Pao on 01/26/2023 16:22:51 -------------------------------------------------------------------------------- Vitals Details Patient Name: Date of Service: [name] [name] 01/26/2023 11:30 A M Medical Record Number: [id number] Patient Account Number: [id number] Date of Birth/Sex: Treating RN: 1933/04/24 (89 y.o. [name] Primary Care Amarrah Meinhart: [name] Other Clinician: Daryll Brod Referring Aveena Bari: Treating Jef Futch/Extender: Tonye Pearson in Treatment: 29 Vital Signs Time Taken: 12:20 Temperature (F): 97.2 Height (in): 71 Pulse (bpm): 118 Weight (lbs): 170 Respiratory Rate (breaths/min): 18 Body Mass Index (BMI): 23.7 Blood Pressure  (mmHg): 136/86 Capillary Blood Glucose (mg/dl): 093 Reference Range: 80 - 120 mg / dl Electronic Signature(s) Signed: 01/27/2023 12:13:00 PM By: Demetria Pore Entered By: Demetria Pore on 01/26/2023 14:19:11

## 2023-01-27 NOTE — Progress Notes (Signed)
[name], [name] ([id number]) [id number] Page 1 of 3 Visit Report for 01/27/2023 HBO Details Patient Name: Date of Service: [name] [name] 01/27/2023 9:30 A M Medical Record Number: [id number] Patient Account Number: [id number] Date of Birth/Sex: Treating RN: March 13, 1933 (89 y.o. [name] Primary Care Areej Tayler: [name] Other Clinician: Daryll Brod Referring Marlan Steward: Treating Tishia Maestre/Extender: Tonye Pearson in Treatment: 29 HBO Treatment Course Details Treatment Course Number: 1 Ordering Deondra Labrador: [name] T Treatments Ordered: otal 40 HBO Treatment Start Date: 01/09/2023 HBO Indication: Diabetic Ulcer(s) of the Lower Extremity Standard/Conservative Wound Care tried and failed greater than or equal to 30 days Wound #3 Right Amputation Site - T oe HBO Treatment Details Treatment Number: 15 Patient Type: Outpatient Chamber Type: Monoplace Chamber Serial #: [id number] Treatment Protocol: 2.0 ATA with 90 minutes oxygen, and no air breaks Treatment Details Compression Rate Down: 2.0 psi / minute De-Compression Rate Up: 2.0 psi / minute Air breaks and breathing Decompress Decompress Compress Tx Pressure Begins Reached periods Begins Ends (leave unused spaces blank) Chamber Pressure (ATA 1 2 ------2 1 ) Clock Time (24 hr) 9:49 10:03 - - - - - - 11:33 11:39 Treatment Length: 110 (minutes) Treatment Segments: 4 Vital Signs Capillary Blood Glucose Reference Range: 80 - 120 mg / dl HBO Diabetic Blood Glucose Intervention Range: <131 mg/dl or >782 mg/dl Type: Time Vitals Blood Pulse: Respiratory Temperature: Capillary Blood Glucose Pulse Action Taken: Pressure: Rate: Glucose (mg/dl): Meter #: Oximetry (%) Taken: Pre 09:40 118/77 118 18 97.2 172 1 asymptomatic for tachycardia Post 11:40 121/84 96 18 97 213 1 none per protocol Treatment Response Treatment Toleration: Well Treatment  Completion Status: Treatment Completed without Adverse Event Treatment Notes I certify that I directed and performed operation of said chamber for this treatment. [place] Mr. Hoefling arrived with heart rate of 118, apparently. I measured heart rate manually yesterday and it was within range. This did not happen today. 01/26/2023- When asked if he was bothered by getting in the chamber, patient stated that he doesn't really want to get in the chamber but is doing this to improve his wound. Anxiety could explain the elevated heart rate. Heart rate has been irregular and reported >100 bpm by dynamap machine prior to treatment. Manual counts prove less beats and irregular pattern. Post-treatment HR is usually within range. After performing a safety check, patient was placed in the chamber which was compressed at a rate of 2 psi/min after confirming normal ear equalization. He tolerated the treatment and subsequent decompression of the chamber at the rate of 2 psi/min. Patient denied issues with ear equalization and/or pain associated with barotrauma. Post-treatment vital signs were within normal range. He was stable upon discharge. Additional Procedure Documentation Tissue Sevierity: Necrosis of bone Physician HBO Attestation: I certify that I supervised this HBO treatment in accordance with Medicare [name], [name] ([id number]) [id number] Page 2 of 3 guidelines. A trained emergency response team is readily available per Yes hospital policies and procedures. Continue HBOT as ordered. Yes Electronic Signature(s) Signed: 01/27/2023 3:08:31 PM By: Haywood Pao CHT EMT BS , , Signed: 01/30/2023 8:43:29 AM By: Duanne Guess MD FACS Previous Signature: 01/27/2023 3:02:38 PM Version By: Haywood Pao CHT EMT BS , , Previous Signature: 01/27/2023 12:49:35 PM Version By: Duanne Guess MD FACS Previous Signature: 01/27/2023 12:13:00 PM Version By: Demetria Pore Entered By: Haywood Pao on 01/27/2023 12:08:31 -------------------------------------------------------------------------------- HBO Safety Checklist Details Patient Name: Date of Service: [name] [name] 01/27/2023 9:30 A  M Medical Record Number: [id number] Patient Account Number: [id number] Date of Birth/Sex: Treating RN: 1933/07/20 (89 y.o. [name], [name].[name] Primary Care Faryn Sieg: [name] Other Clinician: Daryll Brod Referring Janith Nielson: Treating Bethel Gaglio/Extender: Tonye Pearson in Treatment: 29 HBO Safety Checklist Items Safety Checklist Consent Form Signed Patient voided / foley secured and emptied When did you last eato 9:00 bacon and 2 steak biscuits Last dose of injectable or oral agent 9:00 Ostomy pouch emptied and vented if applicable NA All implantable devices assessed, documented and approved NA Intravenous access site secured and place NA Valuables secured Linens and cotton and cotton/polyester blend (less than 51% polyester) Personal oil-based products / skin lotions / body lotions removed Wigs or hairpieces removed NA Smoking or tobacco materials removed NA Books / newspapers / magazines / loose paper removed Cologne, aftershave, perfume and deodorant removed Jewelry removed (may wrap wedding band) Make-up removed NA Hair care products removed Battery operated devices (external) removed NA Heating patches and chemical warmers removed NA Titanium eyewear removed NA Nail polish cured greater than 10 hours NA Casting material cured greater than 10 hours NA Hearing aids removed NA Loose dentures or partials removed NA Prosthetics have been removed NA Patient demonstrates correct use of air break device (if applicable) Patient concerns have been addressed Patient grounding bracelet on and cord attached to chamber Specifics for Inpatients (complete in addition to above) Medication sheet sent with  patient NA Intravenous medications needed or due during therapy sent with patient NA Drainage tubes (e.g. nasogastric tube or chest tube secured and vented) NA Endotracheal or Tracheotomy tube secured NA Cuff deflated of air and inflated with saline NA Airway suctioned NA Electronic Signature(s) Signed: 01/27/2023 12:13:00 PM By: Corine Shelter, Jasun ([id number]) [id number] Page 3 of 3 Entered By: Demetria Pore on 01/27/2023 08:12:32

## 2023-01-30 ENCOUNTER — Encounter (HOSPITAL_BASED_OUTPATIENT_CLINIC_OR_DEPARTMENT_OTHER): Payer: PPO | Admitting: General Surgery

## 2023-01-30 DIAGNOSIS — E11621 Type 2 diabetes mellitus with foot ulcer: Secondary | ICD-10-CM | POA: Diagnosis not present

## 2023-01-30 LAB — GLUCOSE, CAPILLARY
Glucose-Capillary: 187 mg/dL — ABNORMAL HIGH (ref 70–99)
Glucose-Capillary: 153 mg/dL — ABNORMAL HIGH (ref 70–99)

## 2023-01-30 NOTE — Progress Notes (Addendum)
[name], [name] ([id number]) [id number] Page 1 of 3 Visit Report for 01/30/2023 HBO Details Patient Name: Date of Service: [name] [name] 01/30/2023 11:30 A M Medical Record Number: [id number] Patient Account Number: [id number] Date of Birth/Sex: Treating RN: 01/07/1934 (89 y.o. [name], [name] Primary Care Peregrine Nolt: [name] Other Clinician: Haywood Pao Referring Joci Dress: Treating Jamario Colina/Extender: Tonye Pearson in Treatment: 30 HBO Treatment Course Details Treatment Course Number: 1 Ordering Sheina Mcleish: [name] T Treatments Ordered: otal 40 HBO Treatment Start Date: 01/09/2023 HBO Indication: Diabetic Ulcer(s) of the Lower Extremity Standard/Conservative Wound Care tried and failed greater than or equal to 30 days Wound #3 Right Amputation Site - T oe HBO Treatment Details Treatment Number: 16 Patient Type: Outpatient Chamber Type: Monoplace Chamber Serial #: [id number] Treatment Protocol: 2.0 ATA with 90 minutes oxygen, and no air breaks Treatment Details Compression Rate Down: 1.5 psi / minute De-Compression Rate Up: 2.0 psi / minute Air breaks and breathing Decompress Decompress Compress Tx Pressure Begins Reached periods Begins Ends (leave unused spaces blank) Chamber Pressure (ATA 1 2 ------2 1 ) Clock Time (24 hr) 12:46 12:59 - - - - - - 14:29 14:38 Treatment Length: 112 (minutes) Treatment Segments: 4 Vital Signs Capillary Blood Glucose Reference Range: 80 - 120 mg / dl HBO Diabetic Blood Glucose Intervention Range: <131 mg/dl or >010 mg/dl Type: Time Vitals Blood Respiratory Capillary Blood Glucose Pulse Action Pulse: Temperature: Taken: Pressure: Rate: Glucose (mg/dl): Meter #: Oximetry (%) Taken: Pre 12:32 141/87 108 18 98.1 187 manual pulse 82 Post 14:41 127/86 95 18 97.5 153 none per protocol Treatment Response Treatment Toleration: Well Treatment Completion Status:  Treatment Completed without Adverse Event Treatment Notes Mr. Lifsey arrived with heart rate of 108. Manual pulse was 82 irregular. 01/26/2023- When asked if he was bothered by getting in the chamber, patient stated that he doesn't really want to get in the chamber but is doing this to improve his wound. 01/30/2023 - Educated patient on the purpose of HBOT what we , are trying to achieve with the benefits of the treatments (i.e. increased oxygen tension in the tissues, angiogenesis, anaerobic bacteria killing and that he is in control of his treatment. That we would recommend completing treatments but he is in control of his treatment. After performing a safety check, patient was placed in the chamber which was compressed at a rate of 2 psi/min after confirming normal ear equalization. He tolerated the treatment and subsequent decompression at the rate of 2 psi/min. Patient denied issues with ear equalization and/or pain associated with barotrauma. Post-treatment vital signs were normal, heart rate 95 bpm, regular heart rate. Patient was stable upon discharge. Additional Procedure Documentation Tissue Sevierity: Necrosis of bone Physician HBO Attestation: I certify that I supervised this HBO treatment in accordance with Medicare guidelines. A trained emergency response team is readily available per Yes MASRI, Khai ([id number]) [id number] Page 2 of 3 hospital policies and procedures. Continue HBOT as ordered. Yes Electronic Signature(s) Signed: 01/30/2023 4:38:16 PM By: Duanne Guess MD FACS Previous Signature: 01/30/2023 3:13:13 PM Version By: Haywood Pao CHT EMT BS , , Previous Signature: 01/30/2023 1:43:40 PM Version By: Haywood Pao CHT EMT BS , , Entered By: Duanne Guess on 01/30/2023 13:38:16 -------------------------------------------------------------------------------- HBO Safety Checklist Details Patient Name: Date of  Service: [name] [name] 01/30/2023 11:30 A M Medical Record Number: [id number] Patient Account Number: [id number] Date of Birth/Sex: Treating RN: 01-16-1934 (89 y.o. [name] Primary Care Charly Hunton: [name]  Other Clinician: Haywood Pao Referring Meliah Appleman: Treating Mamie Hundertmark/Extender: Tonye Pearson in Treatment: 30 HBO Safety Checklist Items Safety Checklist Consent Form Signed Patient voided / foley secured and emptied When did you last eato 1000 Last dose of injectable or oral agent 0800 Ostomy pouch emptied and vented if applicable NA All implantable devices assessed, documented and approved NA Intravenous access site secured and place NA Valuables secured Linens and cotton and cotton/polyester blend (less than 51% polyester) Personal oil-based products / skin lotions / body lotions removed Wigs or hairpieces removed NA Smoking or tobacco materials removed NA Books / newspapers / magazines / loose paper removed Cologne, aftershave, perfume and deodorant removed Jewelry removed (may wrap wedding band) Make-up removed NA Hair care products removed Battery operated devices (external) removed Heating patches and chemical warmers removed Titanium eyewear removed Nail polish cured greater than 10 hours Casting material cured greater than 10 hours NA Hearing aids removed removed Loose dentures or partials removed dentures removed Prosthetics have been removed NA Patient demonstrates correct use of air break device (if applicable) Patient concerns have been addressed Patient grounding bracelet on and cord attached to chamber Specifics for Inpatients (complete in addition to above) Medication sheet sent with patient NA Intravenous medications needed or due during therapy sent with patient NA Drainage tubes (e.g. nasogastric tube or chest tube secured and vented) NA Endotracheal or Tracheotomy tube secured NA Cuff  deflated of air and inflated with saline NA Airway suctioned NA Notes Paper version used prior to treatment start. Electronic Signature(s) Signed: 01/30/2023 1:37:41 PM By: Haywood Pao CHT EMT BS , , Alger, Thomas [phone]: Haywood Pao CHT EMT BS [phone].pdf Page 3 of 3 Signed: 01/30/2023 1:37:41 PM , , Entered By: Haywood Pao on 01/30/2023 10:37:40

## 2023-01-30 NOTE — Progress Notes (Signed)
RHEIN, Tyren ([id number]) [id number] Page 1 of 1 Visit Report for 01/30/2023 SuperBill Details Patient Name: Date of Service: [name] 01/30/2023 Medical Record Number: [id number] Patient Account Number: [id number] Date of Birth/Sex: Treating RN: May 09, 1933 (89 y.o. 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 E11.621 Type 2 diabetes mellitus with foot ulcer I70.235 Atherosclerosis of native arteries of right leg with ulceration of other part of foot Facility Procedures CPT4 Code Description Modifier Quantity [id number]-(Facility Use Only) HBOT full body chamber, , 4 ICD-10 Diagnosis Description E11.621 Type 2 diabetes mellitus with foot ulcer 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.512 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.235 Atherosclerosis of native arteries of right leg with ulceration of other part of foot Physician Procedures Quantity CPT4 Code Description Modifier [id number] - WC PHYS HYPERBARIC OXYGEN THERAPY 1 ICD-10 Diagnosis Description E11.621 Type 2 diabetes mellitus with foot ulcer 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.512 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

## 2023-01-30 NOTE — Progress Notes (Addendum)
FAIRFIELD, Ohm ([id number]) [id number] Page 1 of 2 Visit Report for 01/30/2023 Arrival Information Details Patient Name: Date of Service: [name] [name] 01/30/2023 11:30 A M Medical Record Number: [id number] Patient Account Number: [id number] Date of Birth/Sex: Treating RN: Mar 12, 1933 (89 y.o. [name] Primary Care Iesha Summerhill: [name] Other Clinician: Haywood Pao Referring Zoraya Fiorenza: Treating Genesia Caslin/Extender: Tonye Pearson in Treatment: 30 Visit Information History Since Last Visit All ordered tests and consults were completed: Yes Patient Arrived: Ambulatory Added or deleted any medications: No Arrival Time: 12:04 Any new allergies or adverse reactions: No Accompanied By: self Had a fall or experienced change in No Transfer Assistance: None activities of daily living that may affect Patient Identification Verified: Yes risk of falls: Secondary Verification Process Completed: Yes Signs or symptoms of abuse/neglect since last visito No Patient Requires Transmission-Based Precautions: No Hospitalized since last visit: No Patient Has Alerts: Yes Implantable device outside of the clinic excluding No Patient Alerts: Patient on Blood Thinner cellular tissue based products placed in the center since last visit: Pain Present Now: No Electronic Signature(s) Signed: 01/30/2023 1:35:49 PM By: Haywood Pao CHT EMT BS , , Entered By: Haywood Pao on 01/30/2023 13:35:49 -------------------------------------------------------------------------------- Encounter Discharge Information Details Patient Name: Date of Service: [name] [name] 01/30/2023 11:30 A M Medical Record Number: [id number] Patient Account Number: [id number] Date of Birth/Sex: Treating RN: Jul 28, 1933 (89 y.o. [name] Primary Care Jianni Shelden: [name] Other Clinician: Haywood Pao Referring  Nachum Derossett: Treating Aybree Lanyon/Extender: Tonye Pearson in Treatment: 30 Encounter Discharge Information Items Discharge Condition: Stable Ambulatory Status: Ambulatory Discharge Destination: Home Transportation: Private Auto Accompanied By: daughter Schedule Follow-up Appointment: No Clinical Summary of Care: Electronic Signature(s) Signed: 01/30/2023 3:26:37 PM By: Haywood Pao CHT EMT BS , , Entered By: Haywood Pao on 01/30/2023 15:26:37 Minnis, Kush ([id number]) [id number] Page 2 of 2 -------------------------------------------------------------------------------- Vitals Details Patient Name: Date of Service: [name] [name] 01/30/2023 11:30 A M Medical Record Number: [id number] Patient Account Number: [id number] Date of Birth/Sex: Treating RN: [birth year] (89 y.o. [name] Primary Care Jordanny Waddington: [name] Other Clinician: Haywood Pao Referring Cayleen Benjamin: Treating Shyan Scalisi/Extender: Tonye Pearson in Treatment: 30 Vital Signs Time Taken: 12:32 Temperature (F): 98.1 Height (in): 71 Pulse (bpm): 108 Weight (lbs): 170 Respiratory Rate (breaths/min): 18 Body Mass Index (BMI): 23.7 Blood Pressure (mmHg): 141/87 Capillary Blood Glucose (mg/dl): 774 Reference Range: 80 - 120 mg / dl Electronic Signature(s) Signed: 01/30/2023 1:36:24 PM By: Haywood Pao CHT EMT BS , , Entered By: Haywood Pao on 01/30/2023 13:36:23

## 2023-01-31 ENCOUNTER — Encounter (HOSPITAL_BASED_OUTPATIENT_CLINIC_OR_DEPARTMENT_OTHER): Payer: PPO | Admitting: General Surgery

## 2023-01-31 DIAGNOSIS — E11621 Type 2 diabetes mellitus with foot ulcer: Secondary | ICD-10-CM | POA: Diagnosis not present

## 2023-01-31 LAB — GLUCOSE, CAPILLARY: Glucose-Capillary: 153 mg/dL — ABNORMAL HIGH (ref 70–99)

## 2023-01-31 NOTE — Progress Notes (Signed)
DOUBEK, Rube ([id number]) [id number] Page 1 of 2 Visit Report for 01/31/2023 HBO Details Patient Name: Date of Service: [name] 01/31/2023 11:30 A M Medical Record Number: [id number] Patient Account Number: [id number] Date of Birth/Sex: Treating RN: [birth year] (89 y.o. [name] Primary Care Klarissa Mcilvain: [name] Other Clinician: Haywood Pao Referring Ola Raap: Treating Giulliana Mcroberts/Extender: Tonye Pearson in Treatment: 30 HBO Treatment Course Details Treatment Course Number: 1 Ordering Wesleigh Markovic: [name] T Treatments Ordered: otal 40 HBO Treatment Start Date: 01/09/2023 HBO Indication: Diabetic Ulcer(s) of the Lower Extremity Standard/Conservative Wound Care tried and failed greater than or equal to 30 days Wound #3 Right Amputation Site - T oe HBO Treatment Details Treatment Number: 17 Patient Type: Outpatient Chamber Type: Monoplace Chamber Serial #: [id number] Treatment Protocol: 2.0 ATA with 90 minutes oxygen, and no air breaks Treatment Details Compression Rate Down: 1.5 psi / minute De-Compression Rate Up: 2.0 psi / minute Air breaks and breathing Decompress Decompress Compress Tx Pressure Begins Reached periods Begins Ends (leave unused spaces blank) Chamber Pressure (ATA 1 2 ------2 1 ) Clock Time (24 hr) 10:37 10:48 - - - - - - 12:18 12:26 Treatment Length: 109 (minutes) Treatment Segments: 4 Vital Signs Capillary Blood Glucose Reference Range: 80 - 120 mg / dl HBO Diabetic Blood Glucose Intervention Range: <131 mg/dl or >253 mg/dl Type: Time Vitals Blood Respiratory Capillary Blood Glucose Pulse Action Pulse: Temperature: Taken: Pressure: Rate: Glucose (mg/dl): Meter #: Oximetry (%) Taken: Pre 09:48 130/84 102 18 98.3 153 none per protocol Post 12:30 137/84 89 18 98 191 none per protocol Treatment Response Treatment Toleration: Well Treatment Completion  Status: Treatment Completed without Adverse Event Treatment Notes Mr. Brunkhorst arrived with heart rate of 102 bpm. Other vital signs normal. After performing a safety check, patient was placed in the chamber which was compressed at a rate of 2 psi/min after confirming normal ear equalization. He tolerated the treatment and subsequent decompression at the rate of 2 psi/min. Patient denied issues with ear equalization and/or pain associated with barotrauma. Post-treatment vital signs were normal. Patient was stable upon discharge. Additional Procedure Documentation Tissue Sevierity: Necrosis of bone Physician HBO Attestation: I certify that I supervised this HBO treatment in accordance with Medicare guidelines. A trained emergency response team is readily available per Yes hospital policies and procedures. Continue HBOT as ordered. Hursel Mcnell, Victorio ([id number]) [id number] Page 2 of 2 Electronic Signature(s) Signed: 01/31/2023 5:36:02 PM By: Duanne Guess MD FACS Previous Signature: 01/31/2023 2:02:42 PM Version By: Haywood Pao CHT EMT BS , , Entered By: Duanne Guess on 01/31/2023 17:36:02 -------------------------------------------------------------------------------- HBO Safety Checklist Details Patient Name: Date of Service: [name] 01/31/2023 11:30 A M Medical Record Number: [id number] Patient Account Number: [id number] Date of Birth/Sex: Treating RN: December 20, 1933 (89 y.o. [name] Primary Care Marisabel Macpherson: [name] Other Clinician: Haywood Pao Referring Gaylon Bentz: Treating Theador Jezewski/Extender: Tonye Pearson in Treatment: 30 HBO Safety Checklist Items Safety Checklist Consent Form Signed Patient voided / foley secured and emptied When did you last eato 0830 - [employer], Coffee Last dose of injectable or oral agent 0800 Jardiance Ostomy pouch emptied and vented if  applicable NA All implantable devices assessed, documented and approved NA Intravenous access site secured and place NA Valuables secured Linens and cotton and cotton/polyester blend (less than 51% polyester) Personal oil-based products / skin lotions / body lotions removed Wigs or hairpieces removed NA Smoking or tobacco materials removed NA Books /  newspapers / magazines / loose paper removed Cologne, aftershave, perfume and deodorant removed Jewelry removed (may wrap wedding band) Make-up removed NA Hair care products removed Battery operated devices (external) removed Heating patches and chemical warmers removed Titanium eyewear removed Nail polish cured greater than 10 hours NA Casting material cured greater than 10 hours NA Hearing aids removed removed Loose dentures or partials removed dentures removed Prosthetics have been removed NA Patient demonstrates correct use of air break device (if applicable) Patient concerns have been addressed Patient grounding bracelet on and cord attached to chamber Specifics for Inpatients (complete in addition to above) Medication sheet sent with patient NA Intravenous medications needed or due during therapy sent with patient NA Drainage tubes (e.g. nasogastric tube or chest tube secured and vented) NA Endotracheal or Tracheotomy tube secured NA Cuff deflated of air and inflated with saline NA Airway suctioned NA Notes Paper version used prior to treatment start. Electronic Signature(s) Signed: 01/31/2023 2:00:30 PM By: Haywood Pao CHT EMT BS , , Entered By: Haywood Pao on 01/31/2023 14:00:30

## 2023-01-31 NOTE — Progress Notes (Signed)
[name], [name] ([id number]) [id number] Page 1 of 2 Visit Report for 01/31/2023 Arrival Information Details Patient Name: Date of Service: [name] [name] 01/31/2023 11:30 A M Medical Record Number: [id number] Patient Account Number: [id number] Date of Birth/Sex: Treating RN: 22-Jan-1934 (89 y.o. [name] Primary Care Lindora Alviar: [name] Other Clinician: Haywood Pao Referring Javaughn Opdahl: Treating Shiloh Swopes/Extender: Tonye Pearson in Treatment: 30 Visit Information History Since Last Visit All ordered tests and consults were completed: Yes Patient Arrived: Ambulatory Added or deleted any medications: No Arrival Time: 09:48 Any new allergies or adverse reactions: No Accompanied By: self Had a fall or experienced change in No Transfer Assistance: None activities of daily living that may affect Patient Identification Verified: Yes risk of falls: Secondary Verification Process Completed: Yes Signs or symptoms of abuse/neglect since last visito No Patient Requires Transmission-Based Precautions: No Hospitalized since last visit: No Patient Has Alerts: Yes Implantable device outside of the clinic excluding No Patient Alerts: Patient on Blood Thinner cellular tissue based products placed in the center since last visit: Pain Present Now: No Electronic Signature(s) Signed: 01/31/2023 1:57:05 PM By: Haywood Pao CHT EMT BS , , Entered By: Haywood Pao on 01/31/2023 13:57:05 -------------------------------------------------------------------------------- Encounter Discharge Information Details Patient Name: Date of Service: [name] [name] 01/31/2023 11:30 A M Medical Record Number: [id number] Patient Account Number: [id number] Date of Birth/Sex: Treating RN: Apr 28, 1933 (89 y.o. [name] Primary Care Gery Sabedra: [name] Other Clinician: Haywood Pao Referring  Ebin Palazzi: Treating Jaicob Dia/Extender: Tonye Pearson in Treatment: 30 Encounter Discharge Information Items Discharge Condition: Stable Ambulatory Status: Ambulatory Discharge Destination: Home Transportation: Private Auto Accompanied By: self Schedule Follow-up Appointment: No Clinical Summary of Care: Electronic Signature(s) Signed: 01/31/2023 2:03:46 PM By: Haywood Pao CHT EMT BS , , Entered By: Haywood Pao on 01/31/2023 14:03:46 Woo, Boruch ([id number]) [id number] Page 2 of 2 -------------------------------------------------------------------------------- Vitals Details Patient Name: Date of Service: [name] [name] 01/31/2023 11:30 A M Medical Record Number: [id number] Patient Account Number: [id number] Date of Birth/Sex: Treating RN: 1933/05/31 (89 y.o. [name] Primary Care Camp Gopal: [name] Other Clinician: Haywood Pao Referring Tomio Kirk: Treating Calirose Mccance/Extender: Tonye Pearson in Treatment: 30 Vital Signs Time Taken: 09:48 Temperature (F): 98.3 Height (in): 71 Pulse (bpm): 102 Weight (lbs): 170 Respiratory Rate (breaths/min): 18 Body Mass Index (BMI): 23.7 Blood Pressure (mmHg): 130/84 Capillary Blood Glucose (mg/dl): 732 Reference Range: 80 - 120 mg / dl Electronic Signature(s) Signed: 01/31/2023 1:59:15 PM By: Haywood Pao CHT EMT BS , , Entered By: Haywood Pao on 01/31/2023 13:59:15

## 2023-02-01 NOTE — Progress Notes (Signed)
MCMAHEN, Cyruss ([id number]) [id number] Page 1 of 1 Visit Report for 01/31/2023 SuperBill Details Patient Name: Date of Service: [name] 01/31/2023 Medical Record Number: [id number] Patient Account Number: [id number] Date of Birth/Sex: Treating RN: 1933/06/21 (89 y.o. 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 E11.621 Type 2 diabetes mellitus with foot ulcer I70.235 Atherosclerosis of native arteries of right leg with ulceration of other part of foot Facility Procedures CPT4 Code Description Modifier Quantity [id number] G0277-(Facility Use Only) HBOT full body chamber, , 4 ICD-10 Diagnosis Description E11.621 Type 2 diabetes mellitus with foot ulcer 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.512 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.235 Atherosclerosis of native arteries of right leg with ulceration of other part of foot Physician Procedures Quantity CPT4 Code Description Modifier [id number] 99183 - WC PHYS HYPERBARIC OXYGEN THERAPY 1 ICD-10 Diagnosis Description E11.621 Type 2 diabetes mellitus with foot ulcer 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.512 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

## 2023-02-04 DIAGNOSIS — E11621 Type 2 diabetes mellitus with foot ulcer: Secondary | ICD-10-CM | POA: Diagnosis not present

## 2023-02-06 ENCOUNTER — Encounter (HOSPITAL_BASED_OUTPATIENT_CLINIC_OR_DEPARTMENT_OTHER): Payer: PPO | Attending: Internal Medicine | Admitting: Internal Medicine

## 2023-02-06 DIAGNOSIS — E11621 Type 2 diabetes mellitus with foot ulcer: Secondary | ICD-10-CM | POA: Diagnosis present

## 2023-02-06 DIAGNOSIS — L97512 Non-pressure chronic ulcer of other part of right foot with fat layer exposed: Secondary | ICD-10-CM | POA: Diagnosis not present

## 2023-02-06 DIAGNOSIS — L97514 Non-pressure chronic ulcer of other part of right foot with necrosis of bone: Secondary | ICD-10-CM | POA: Diagnosis not present

## 2023-02-06 DIAGNOSIS — I70235 Atherosclerosis of native arteries of right leg with ulceration of other part of foot: Secondary | ICD-10-CM | POA: Insufficient documentation

## 2023-02-06 LAB — GLUCOSE, CAPILLARY
Glucose-Capillary: 191 mg/dL — ABNORMAL HIGH (ref 70–99)
Glucose-Capillary: 215 mg/dL — ABNORMAL HIGH (ref 70–99)
Glucose-Capillary: 157 mg/dL — ABNORMAL HIGH (ref 70–99)

## 2023-02-06 NOTE — Progress Notes (Signed)
TIETJEN, Dexter ([id number]) [id number] Page 1 of 3 Visit Report for 02/06/2023 HBO Details Patient Name: Date of Service: [name] [name] 02/06/2023 11:30 A M Medical Record Number: [id number] Patient Account Number: [id number] Date of Birth/Sex: Treating RN: [birth year] (89 y.o. [name] Primary Care Vernor Monnig: [name] Other Clinician: Haywood Pao Referring Paiten Boies: Treating Rosielee Corporan/Extender: [name] in Treatment: 31 HBO Treatment Course Details Treatment Course Number: 1 Ordering Daelon Dunivan: [name] T Treatments Ordered: otal 40 HBO Treatment Start Date: 01/09/2023 HBO Indication: Diabetic Ulcer(s) of the Lower Extremity Standard/Conservative Wound Care tried and failed greater than or equal to 30 days Wound #3 Right Amputation Site - T oe HBO Treatment Details Treatment Number: 18 Patient Type: Outpatient Chamber Type: Monoplace Chamber Serial #: [id number] Treatment Protocol: 2.0 ATA with 90 minutes oxygen, and no air breaks Treatment Details Compression Rate Down: 1.0 psi / minute De-Compression Rate Up: 2.0 psi / minute Air breaks and breathing Decompress Decompress Compress Tx Pressure Begins Reached periods Begins Ends (leave unused spaces blank) Chamber Pressure (ATA 1 2 ------2 1 ) Clock Time (24 hr) 12:30 12:45 - - - - - - 14:15 14:22 Treatment Length: 112 (minutes) Treatment Segments: 4 Vital Signs Capillary Blood Glucose Reference Range: 80 - 120 mg / dl HBO Diabetic Blood Glucose Intervention Range: <131 mg/dl or >132 mg/dl Type: Time Vitals Blood Respiratory Capillary Blood Glucose Pulse Action Pulse: Temperature: Taken: Pressure: Rate: Glucose (mg/dl): Meter #: Oximetry (%) Taken: Pre 12:10 143/85 99 18 98.2 215 none per protocol Post 14:25 142/87 92 18 98.2 157 none per protocol Treatment Response Treatment Toleration: Well Treatment Completion Status:  Treatment Completed without Adverse Event Treatment Notes Mr. Brusso arrived with normal vital signs. He prepared for treatment. After performing a safety check, patient was placed in the chamber which was compressed at a rate of 1 psi/min after confirming normal ear equalization. Patient tolerated the treatment and subsequent decompression at a rate of 2 psi/min. Patient denied issues with ear equalization and/or pain associated with barotrauma. His post-treatment vital signs were normal. He was stable upon discharge with his daughter. Additional Procedure Documentation Tissue Sevierity: Necrosis of bone Lea Walbert Notes No concern with treatment given Physician HBO Attestation: I certify that I supervised this HBO treatment in accordance with Medicare guidelines. A trained emergency response team is readily available per Yes WALDOCK, Pasqual ([id number]) [id number] Page 2 of 3 hospital policies and procedures. Continue HBOT as ordered. Yes Electronic Signature(s) Signed: 02/06/2023 5:24:09 PM By: [name] MD Previous Signature: 02/06/2023 3:21:01 PM Version By: Haywood Pao CHT EMT BS , , Previous Signature: 02/06/2023 3:19:11 PM Version By: Haywood Pao CHT EMT BS , , Previous Signature: 02/06/2023 2:08:03 PM Version By: Haywood Pao CHT EMT BS , , Entered By: [name] on 02/06/2023 17:22:13 -------------------------------------------------------------------------------- HBO Safety Checklist Details Patient Name: Date of Service: [name] [name] 02/06/2023 11:30 A M Medical Record Number: [id number] Patient Account Number: [id number] Date of Birth/Sex: Treating RN: Dec 13, 1933 (89 y.o. [name] Primary Care Abdulahi Schor: [name] Other Clinician: Haywood Pao Referring Breeanna Galgano: Treating Jandy Brackens/Extender: [name] in Treatment: 31 HBO Safety Checklist Items Safety  Checklist Consent Form Signed Patient voided / foley secured and emptied When did you last eato 0900 [name] Last dose of injectable or oral agent 0800 Jardiance Ostomy pouch emptied and vented if applicable NA All implantable devices assessed, documented and approved NA Intravenous access site secured and place NA  Valuables secured Linens and cotton and cotton/polyester blend (less than 51% polyester) Personal oil-based products / skin lotions / body lotions removed Wigs or hairpieces removed NA Smoking or tobacco materials removed NA Books / newspapers / magazines / loose paper removed Cologne, aftershave, perfume and deodorant removed Jewelry removed (may wrap wedding band) Make-up removed Hair care products removed Battery operated devices (external) removed Heating patches and chemical warmers removed Titanium eyewear removed Nail polish cured greater than 10 hours NA Casting material cured greater than 10 hours NA Hearing aids removed NA Loose dentures or partials removed NA Prosthetics have been removed NA Patient demonstrates correct use of air break device (if applicable) Patient concerns have been addressed Patient grounding bracelet on and cord attached to chamber Specifics for Inpatients (complete in addition to above) Medication sheet sent with patient NA Intravenous medications needed or due during therapy sent with patient NA Drainage tubes (e.g. nasogastric tube or chest tube secured and vented) NA Endotracheal or Tracheotomy tube secured NA Cuff deflated of air and inflated with saline NA Airway suctioned NA Notes Paper version used prior to treatment start. Electronic Signature(s) Malverne, Taeveon ([id number]) [id number] Page 3 of 3 Signed: 02/06/2023 2:06:11 PM By: Haywood Pao CHT EMT BS , , Entered By: Haywood Pao on 02/06/2023 14:06:10

## 2023-02-06 NOTE — Progress Notes (Signed)
GERENA, Fard ([id number]) [id number] Page 1 of 2 Visit Report for 02/06/2023 Arrival Information Details Patient Name: Date of Service: [name] [name] 02/06/2023 11:30 A M Medical Record Number: [id number] Patient Account Number: [id number] Date of Birth/Sex: Treating RN: [birth year] (89 y.o. [name] Primary Care Jissel Slavens: [name] Other Clinician: Haywood Pao Referring Reisha Wos: Treating Obie Silos/Extender: [name] in Treatment: 31 Visit Information History Since Last Visit All ordered tests and consults were completed: Yes Patient Arrived: Ambulatory Added or deleted any medications: No Arrival Time: 12:02 Any new allergies or adverse reactions: No Accompanied By: self Had a fall or experienced change in No Transfer Assistance: None activities of daily living that may affect Patient Identification Verified: Yes risk of falls: Secondary Verification Process Completed: Yes Signs or symptoms of abuse/neglect since last visito No Patient Requires Transmission-Based Precautions: No Hospitalized since last visit: No Patient Has Alerts: Yes Implantable device outside of the clinic excluding No Patient Alerts: Patient on Blood Thinner cellular tissue based products placed in the center since last visit: Pain Present Now: No Electronic Signature(s) Signed: 02/06/2023 2:04:17 PM By: Haywood Pao CHT EMT BS , , Entered By: Haywood Pao on 02/06/2023 14:04:16 -------------------------------------------------------------------------------- Encounter Discharge Information Details Patient Name: Date of Service: [name] [name] 02/06/2023 11:30 A M Medical Record Number: [id number] Patient Account Number: [id number] Date of Birth/Sex: Treating RN: 16-Sep-1933 (89 y.o. [name] Primary Care Verita Kuroda: [name] Other Clinician: Haywood Pao Referring  Collis Thede: Treating Angeliyah Kirkey/Extender: [name] in Treatment: 31 Encounter Discharge Information Items Discharge Condition: Stable Ambulatory Status: Ambulatory Discharge Destination: Home Transportation: Private Auto Accompanied By: daughter Schedule Follow-up Appointment: No Clinical Summary of Care: Electronic Signature(s) Signed: 02/06/2023 3:22:20 PM By: Haywood Pao CHT EMT BS , , Entered By: Haywood Pao on 02/06/2023 15:22:20 [name], [name] ([id number]) [id number] Page 2 of 2 -------------------------------------------------------------------------------- Vitals Details Patient Name: Date of Service: [name] [name] 02/06/2023 11:30 A M Medical Record Number: [id number] Patient Account Number: [id number] Date of Birth/Sex: Treating RN: [birth year] (89 y.o. [name] Primary Care Dalaney Needle: [name] Other Clinician: Haywood Pao Referring Liley Rake: Treating Rachelann Enloe/Extender: [name] in Treatment: 31 Vital Signs Time Taken: 12:10 Temperature (F): 98.2 Height (in): 71 Pulse (bpm): 99 Weight (lbs): 170 Respiratory Rate (breaths/min): 18 Body Mass Index (BMI): 23.7 Blood Pressure (mmHg): 143/85 Capillary Blood Glucose (mg/dl): 433 Reference Range: 80 - 120 mg / dl Electronic Signature(s) Signed: 02/06/2023 2:05:07 PM By: Haywood Pao CHT EMT BS , , Entered By: Haywood Pao on 02/06/2023 14:05:07

## 2023-02-07 ENCOUNTER — Encounter (HOSPITAL_BASED_OUTPATIENT_CLINIC_OR_DEPARTMENT_OTHER): Payer: PPO | Admitting: Internal Medicine

## 2023-02-07 ENCOUNTER — Ambulatory Visit (HOSPITAL_BASED_OUTPATIENT_CLINIC_OR_DEPARTMENT_OTHER): Payer: PPO | Admitting: Internal Medicine

## 2023-02-07 DIAGNOSIS — E11621 Type 2 diabetes mellitus with foot ulcer: Secondary | ICD-10-CM | POA: Diagnosis not present

## 2023-02-07 LAB — GLUCOSE, CAPILLARY
Glucose-Capillary: 192 mg/dL — ABNORMAL HIGH (ref 70–99)
Glucose-Capillary: 138 mg/dL — ABNORMAL HIGH (ref 70–99)

## 2023-02-07 NOTE — Progress Notes (Signed)
WOOLERY, Zackaria ([id number]) [id number] Page 1 of 1 Visit Report for 02/06/2023 SuperBill Details Patient Name: Date of Service: [name] [name] 02/06/2023 Medical Record Number: [id number] Patient Account Number: [id number] Date of Birth/Sex: Treating RN: June 05, 1933 (89 y.o. 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 E11.621 Type 2 diabetes mellitus with foot ulcer I70.235 Atherosclerosis of native arteries of right leg with ulceration of other part of foot Facility Procedures CPT4 Code Description Modifier Quantity [id number] G0277-(Facility Use Only) HBOT full body chamber, , 4 ICD-10 Diagnosis Description E11.621 Type 2 diabetes mellitus with foot ulcer 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.512 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.235 Atherosclerosis of native arteries of right leg with ulceration of other part of foot Physician Procedures Quantity CPT4 Code Description Modifier [id number] 99183 - WC PHYS HYPERBARIC OXYGEN THERAPY 1 ICD-10 Diagnosis Description E11.621 Type 2 diabetes mellitus with foot ulcer 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.512 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

## 2023-02-08 ENCOUNTER — Encounter (HOSPITAL_BASED_OUTPATIENT_CLINIC_OR_DEPARTMENT_OTHER): Payer: PPO | Admitting: Internal Medicine

## 2023-02-08 ENCOUNTER — Other Ambulatory Visit: Payer: Self-pay

## 2023-02-08 DIAGNOSIS — E1165 Type 2 diabetes mellitus with hyperglycemia: Secondary | ICD-10-CM

## 2023-02-08 DIAGNOSIS — E11621 Type 2 diabetes mellitus with foot ulcer: Secondary | ICD-10-CM | POA: Diagnosis not present

## 2023-02-08 LAB — GLUCOSE, CAPILLARY
Glucose-Capillary: 184 mg/dL — ABNORMAL HIGH (ref 70–99)
Glucose-Capillary: 151 mg/dL — ABNORMAL HIGH (ref 70–99)

## 2023-02-08 MED ORDER — GABAPENTIN 300 MG PO CAPS: ORAL_CAPSULE | ORAL | 1 refills | Status: DC

## 2023-02-08 NOTE — Progress Notes (Addendum)
MARANO, Mckenna ([id number]) [id number] Page 1 of 3 Visit Report for 02/07/2023 HBO Details Patient Name: Date of Service: [name] 02/07/2023 11:30 A M Medical Record Number: [id number] Patient Account Number: [id number] Date of Birth/Sex: Treating RN: 01/22/1934 (89 y.o. [name], [name] Primary Care [name]: [name] Other Clinician: [name] Referring [name]: Treating [name]/Extender: [name] in Treatment: 31 HBO Treatment Course Details Treatment Course Number: 1 Ordering [name]: [name] T Treatments Ordered: otal 40 HBO Treatment Start Date: 01/09/2023 HBO Indication: Diabetic Ulcer(s) of the Lower Extremity Standard/Conservative Wound Care tried and failed greater than or equal to 30 days Wound #3 Right Amputation Site - T oe HBO Treatment Details Treatment Number: 19 Patient Type: Outpatient Chamber Type: Monoplace Chamber Serial #: [id number] Treatment Protocol: 2.0 ATA with 90 minutes oxygen, and no air breaks Treatment Details Compression Rate Down: 1.5 psi / minute De-Compression Rate Up: 2.0 psi / minute Air breaks and breathing Decompress Decompress Compress Tx Pressure Begins Reached periods Begins Ends (leave unused spaces blank) Chamber Pressure (ATA 1 2 ------2 1 ) Clock Time (24 hr) 12:43 12:54 - - - - - - 14:24 14:33 Treatment Length: 110 (minutes) Treatment Segments: 4 Vital Signs Capillary Blood Glucose Reference Range: 80 - 120 mg / dl HBO Diabetic Blood Glucose Intervention Range: <131 mg/dl or >324 mg/dl Type: Time Vitals Blood Respiratory Capillary Blood Glucose Pulse Action Pulse: Temperature: Taken: Pressure: Rate: Glucose (mg/dl): Meter #: Oximetry (%) Taken: Pre 12:15 131/81 99 18 98.2 192 none per protocol Post 14:36 136/90 70 18 97.3 138 none per protocol Treatment Response Treatment Toleration: Well Treatment Completion Status:  Treatment Completed without Adverse Event Treatment Notes [name] arrived with normal vital signs. He prepared for treatment. After performing a safety check, patient was placed in the chamber which was compressed at a rate of 1.8 psi/min after confirming normal ear equalization. Patient tolerated the treatment and subsequent decompression at a rate of 2 psi/min. Patient denied issues with ear equalization and/or pain associated with barotrauma. His post-treatment vital signs were normal. He was stable upon discharge. Additional Procedure Documentation Tissue Sevierity: Necrosis of bone [name] Notes no concerns with rx given Physician HBO Attestation: I certify that I supervised this HBO treatment in accordance with Medicare guidelines. A trained emergency response team is readily available per Yes [name], [name] ([id number]) [id number] Page 2 of 3 hospital policies and procedures. Continue HBOT as ordered. Yes Electronic Signature(s) Signed: 02/12/2023 9:47:40 AM By: [name] Najjar MD Previous Signature: 02/08/2023 3:48:14 PM Version By: [name] CHT EMT BS , , Previous Signature: 02/08/2023 3:47:26 PM Version By: [name] CHT EMT BS , , Entered By: [name] on 02/12/2023 09:45:36 -------------------------------------------------------------------------------- HBO Safety Checklist Details Patient Name: Date of Service: [name] 02/07/2023 11:30 A M Medical Record Number: [id number] Patient Account Number: [id number] Date of Birth/Sex: Treating RN: [birth year] (89 y.o. [name] Primary Care [name]: [name] Other Clinician: [name] Referring [name]: Treating [name]/Extender: [name] in Treatment: 31 HBO Safety Checklist Items Safety Checklist Consent Form Signed Patient voided / foley secured and emptied When did you last eato 0830 [name]  Toast Last dose of injectable or oral agent 0800 Jardiance Ostomy pouch emptied and vented if applicable NA All implantable devices assessed, documented and approved NA Intravenous access site secured and place NA Valuables secured Linens and cotton and cotton/polyester blend (less than 51% polyester) Personal oil-based products / skin  lotions / body lotions removed Wigs or hairpieces removed Smoking or tobacco materials removed NA Books / newspapers / magazines / loose paper removed Cologne, aftershave, perfume and deodorant removed Jewelry removed (may wrap wedding band) Make-up removed NA Hair care products removed Battery operated devices (external) removed Heating patches and chemical warmers removed Titanium eyewear removed Nail polish cured greater than 10 hours NA Casting material cured greater than 10 hours NA Hearing aids removed removed Loose dentures or partials removed dentures removed Prosthetics have been removed NA Patient demonstrates correct use of air break device (if applicable) Patient concerns have been addressed Patient grounding bracelet on and cord attached to chamber Specifics for Inpatients (complete in addition to above) Medication sheet sent with patient NA Intravenous medications needed or due during therapy sent with patient NA Drainage tubes (e.g. nasogastric tube or chest tube secured and vented) NA Endotracheal or Tracheotomy tube secured NA Cuff deflated of air and inflated with saline NA Airway suctioned NA Notes Paper version used prior to treatment start. Electronic Signature(s) Signed: 02/08/2023 3:42:20 PM By: [name] CHT EMT BS , , [name], [name] 3:42:20 PM By: [name] CHT EMT BS Signed: 02/08/2023 ([id number]) [id number] Page 3 of 3 , , Entered By: [name] on 02/08/2023 15:42:19

## 2023-02-09 ENCOUNTER — Encounter (HOSPITAL_BASED_OUTPATIENT_CLINIC_OR_DEPARTMENT_OTHER): Payer: PPO | Admitting: Internal Medicine

## 2023-02-09 DIAGNOSIS — E11621 Type 2 diabetes mellitus with foot ulcer: Secondary | ICD-10-CM | POA: Diagnosis not present

## 2023-02-09 LAB — GLUCOSE, CAPILLARY
Glucose-Capillary: 171 mg/dL — ABNORMAL HIGH (ref 70–99)
Glucose-Capillary: 120 mg/dL — ABNORMAL HIGH (ref 70–99)

## 2023-02-09 NOTE — Progress Notes (Addendum)
ELG, Alwin ([id number]) [id number] Page 1 of 3 Visit Report for 02/08/2023 HBO Details Patient Name: Date of Service: [name] 02/08/2023 11:30 A M Medical Record Number: [id number] Patient Account Number: [id number] Date of Birth/Sex: Treating RN: 07-19-1933 (89 y.o. [name] Primary Care Kirsta Probert: [name] Other Clinician: Zenaida Deed Referring Zahid Carneiro: Treating Brayden Brodhead/Extender: [name] in Treatment: 31 HBO Treatment Course Details Treatment Course Number: 1 Ordering Marveline Profeta: [name] T Treatments Ordered: otal 40 HBO Treatment Start Date: 01/09/2023 HBO Indication: Diabetic Ulcer(s) of the Lower Extremity Standard/Conservative Wound Care tried and failed greater than or equal to 30 days Wound #3 Right Amputation Site - T oe HBO Treatment Details Treatment Number: 20 Patient Type: Outpatient Chamber Type: Monoplace Chamber Serial #: [id number] Treatment Protocol: 2.0 ATA with 90 minutes oxygen, and no air breaks Treatment Details Compression Rate Down: 1.5 psi / minute De-Compression Rate Up: 2.0 psi / minute Air breaks and breathing Decompress Decompress Compress Tx Pressure Begins Reached periods Begins Ends (leave unused spaces blank) Chamber Pressure (ATA 1 2 ------2 1 ) Clock Time (24 hr) 12:28 12:40 - - - - - - 14:11 14:17 Treatment Length: 109 (minutes) Treatment Segments: 4 Vital Signs Capillary Blood Glucose Reference Range: 80 - 120 mg / dl HBO Diabetic Blood Glucose Intervention Range: <131 mg/dl or >034 mg/dl Type: Time Vitals Blood Respiratory Capillary Blood Glucose Pulse Action Pulse: Temperature: Taken: Pressure: Rate: Glucose (mg/dl): Meter #: Oximetry (%) Taken: Pre 12:02 139/72 103 18 97.3 184 none per protocol Post 14:21 134/81 79 18 97 151 Treatment Response Treatment Toleration: Well Treatment Completion Status: Treatment Completed  without Adverse Event Treatment Notes [name] arrived with normal vital signs except for heart rate of 103 bpm. He states that between pain and light anxiety about getting in the hyperbaric chamber, this is the likely cause of his heart rate. He prepared for treatment. After performing a safety check, patient was placed in the chamber which was compressed with 100% oxygen at a rate of 2 psi/min after confirming normal ear equalization. Patient tolerated the treatment and subsequent decompression at a rate of 2 psi/min. Patient denied issues with ear equalization and/or pain associated with barotrauma. His post-treatment vital signs were normal. He was stable upon discharge. Additional Procedure Documentation Tissue Sevierity: Necrosis of bone Lateefah Mallery Notes no concerns with rx given. Patient seen for wound care eval Physician HBO Attestation: I certify that I supervised this HBO treatment in accordance with Medicare Topham, Dale ([id number]) [id number] Page 2 of 3 guidelines. A trained emergency response team is readily available per Yes hospital policies and procedures. Continue HBOT as ordered. Yes Electronic Signature(s) Signed: 02/09/2023 4:45:28 AM By: [name] MD Previous Signature: 02/08/2023 6:05:54 PM Version By: Haywood Pao CHT EMT BS , , Entered By: [name] on 02/09/2023 04:39:40 -------------------------------------------------------------------------------- HBO Safety Checklist Details Patient Name: Date of Service: [name] 02/08/2023 11:30 A M Medical Record Number: [id number] Patient Account Number: [id number] Date of Birth/Sex: Treating RN: 1933-07-19 (89 y.o. [name] Primary Care Shahzain Kiester: [name] Other Clinician: Zenaida Deed Referring Orie Baxendale: Treating Arnetra Terris/Extender: [name] in Treatment: 31 HBO Safety Checklist Items Safety  Checklist Consent Form Signed Patient voided / foley secured and emptied When did you last eato 0930 bacon, eggs Last dose of injectable or oral agent 0800 Ostomy pouch emptied and vented if applicable NA All implantable devices assessed, documented and approved NA Intravenous access site secured  and place NA Valuables secured Linens and cotton and cotton/polyester blend (less than 51% polyester) Personal oil-based products / skin lotions / body lotions removed Wigs or hairpieces removed NA Smoking or tobacco materials removed NA Books / newspapers / magazines / loose paper removed Cologne, aftershave, perfume and deodorant removed Jewelry removed (may wrap wedding band) Make-up removed NA Hair care products removed Battery operated devices (external) removed Heating patches and chemical warmers removed Titanium eyewear removed Nail polish cured greater than 10 hours Casting material cured greater than 10 hours NA Hearing aids removed removed Loose dentures or partials removed dentures removed Prosthetics have been removed NA Patient demonstrates correct use of air break device (if applicable) Patient concerns have been addressed Patient grounding bracelet on and cord attached to chamber Specifics for Inpatients (complete in addition to above) Medication sheet sent with patient NA Intravenous medications needed or due during therapy sent with patient NA Drainage tubes (e.g. nasogastric tube or chest tube secured and vented) NA Endotracheal or Tracheotomy tube secured NA Cuff deflated of air and inflated with saline NA Airway suctioned NA Notes Paper version used prior to treatment start. Electronic Signature(s) Signed: 02/08/2023 6:03:02 PM By: Haywood Pao CHT EMT BS , , Oettinger, Liban 6:03:02 PM By: Haywood Pao CHT EMT BS Signed: 02/08/2023 ([id number]) [id number] Page 3 of 3 , , Entered By: Haywood Pao on  02/08/2023 18:03:02

## 2023-02-09 NOTE — Progress Notes (Signed)
SANTAROSA, [name] ([id number]) [id number] Page 1 of 1 Visit Report for 02/08/2023 SuperBill Details Patient Name: Date of Service: [name] 02/08/2023 Medical Record Number: [id number] Patient Account Number: [id number] Date of Birth/Sex: Treating RN: 15-Aug-1933 (89 y.o. 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 E11.621 Type 2 diabetes mellitus with foot ulcer I70.235 Atherosclerosis of native arteries of right leg with ulceration of other part of foot Facility Procedures CPT4 Code Description Modifier Quantity [id number] G0277-(Facility Use Only) HBOT full body chamber, , 4 ICD-10 Diagnosis Description E11.621 Type 2 diabetes mellitus with foot ulcer 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.512 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.235 Atherosclerosis of native arteries of right leg with ulceration of other part of foot Physician Procedures Quantity CPT4 Code Description Modifier [id number] - WC PHYS HYPERBARIC OXYGEN THERAPY 1 ICD-10 Diagnosis Description E11.621 Type 2 diabetes mellitus with foot ulcer 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

## 2023-02-09 NOTE — Progress Notes (Signed)
COURIER, Elizeo ([id number]) [id number] Page 1 of 2 Visit Report for 02/08/2023 Arrival Information Details Patient Name: Date of Service: [name] 02/08/2023 11:30 A M Medical Record Number: [id number] Patient Account Number: [id number] Date of Birth/Sex: Treating RN: [birth year] (89 y.o. [name] Primary Care [name]: [name] Other Clinician: Zenaida [name] Referring [name]: Treating [name]/Extender: [name] in Treatment: 31 Visit Information History Since Last Visit All ordered tests and consults were completed: Yes Patient Arrived: Ambulatory Added or deleted any medications: No Arrival Time: 11:52 Any new allergies or adverse reactions: No Accompanied By: self Had a fall or experienced change in No Transfer Assistance: None activities of daily living that may affect Patient Identification Verified: Yes risk of falls: Secondary Verification Process Completed: Yes Signs or symptoms of abuse/neglect since last visito No Patient Requires Transmission-Based Precautions: No Hospitalized since last visit: No Patient Has Alerts: Yes Implantable device outside of the clinic excluding No Patient Alerts: Patient on Blood Thinner cellular tissue based products placed in the center since last visit: Pain Present Now: No Electronic Signature(s) Signed: 02/08/2023 5:59:00 PM By: [name] CHT EMT BS , , Entered By: [name] on 02/08/2023 17:59:00 -------------------------------------------------------------------------------- Encounter Discharge Information Details Patient Name: Date of Service: [name] 02/08/2023 11:30 A M Medical Record Number: [id number] Patient Account Number: [id number] Date of Birth/Sex: Treating RN: [birth year] (89 y.o. [name] Primary Care [name]: [name] Other Clinician: Zenaida [name] Referring  [name]: Treating [name]/Extender: [name] in Treatment: 31 Encounter Discharge Information Items Discharge Condition: Stable Ambulatory Status: Ambulatory Discharge Destination: Home Transportation: Private Auto Accompanied By: daughter Schedule Follow-up Appointment: No Clinical Summary of Care: Electronic Signature(s) Signed: 02/08/2023 6:06:43 PM By: [name] CHT EMT BS , , Entered By: [name] on 02/08/2023 18:06:43 [name] ([id number]) [id number] Page 2 of 2 -------------------------------------------------------------------------------- Vitals Details Patient Name: Date of Service: [name] 02/08/2023 11:30 A M Medical Record Number: [id number] Patient Account Number: [id number] Date of Birth/Sex: Treating RN: Jan 10, 1934 (89 y.o. [name] Primary Care [name]: [name] Other Clinician: Zenaida [name] Referring [name]: Treating [name]/Extender: [name] in Treatment: 31 Vital Signs Time Taken: 12:02 Temperature (F): 97.3 Height (in): 71 Pulse (bpm): 103 Weight (lbs): 170 Respiratory Rate (breaths/min): 18 Body Mass Index (BMI): 23.7 Blood Pressure (mmHg): 139/72 Capillary Blood Glucose (mg/dl): 433 Reference Range: 80 - 120 mg / dl Electronic Signature(s) Signed: 02/08/2023 6:01:17 PM By: [name] CHT EMT BS , , Entered By: [name] on 02/08/2023 18:01:17

## 2023-02-09 NOTE — Progress Notes (Signed)
[name], [name] ([id number]) [id number] Page 1 of 2 Visit Report for 02/07/2023 Arrival Information Details Patient Name: Date of Service: [name] 02/07/2023 11:30 A M Medical Record Number: [id number] Patient Account Number: [id number] Date of Birth/Sex: Treating RN: March 09, 1933 (89 y.o. [name] Primary Care [name]: [name] Other Clinician: Haywood Pao Referring [name]: Treating [name]/Extender: [name] in Treatment: 31 Visit Information History Since Last Visit All ordered tests and consults were completed: Yes Patient Arrived: Ambulatory Added or deleted any medications: No Arrival Time: 12:04 Any new allergies or adverse reactions: No Accompanied By: self Had a fall or experienced change in No Transfer Assistance: None activities of daily living that may affect Patient Identification Verified: Yes risk of falls: Secondary Verification Process Completed: Yes Signs or symptoms of abuse/neglect since last visito No Patient Requires Transmission-Based Precautions: No Hospitalized since last visit: No Patient Has Alerts: Yes Implantable device outside of the clinic excluding No Patient Alerts: Patient on Blood Thinner cellular tissue based products placed in the center since last visit: Pain Present Now: No Electronic Signature(s) Signed: 02/08/2023 3:32:03 PM By: Haywood Pao CHT EMT BS , , Entered By: Haywood Pao on 02/08/2023 15:32:02 -------------------------------------------------------------------------------- Encounter Discharge Information Details Patient Name: Date of Service: [name] 02/07/2023 11:30 A M Medical Record Number: [id number] Patient Account Number: [id number] Date of Birth/Sex: Treating RN: 1933/06/30 (89 y.o. [name] Primary Care [name]: [name] Other Clinician: Haywood Pao Referring  [name]: Treating [name]/Extender: [name] in Treatment: 31 Encounter Discharge Information Items Discharge Condition: Stable Ambulatory Status: Ambulatory Discharge Destination: Home Transportation: Private Auto Accompanied By: self Schedule Follow-up Appointment: No Clinical Summary of Care: Electronic Signature(s) Signed: 02/08/2023 3:49:17 PM By: Haywood Pao CHT EMT BS , , Entered By: Haywood Pao on 02/08/2023 15:49:17 [name], [name] ([id number]) [id number] Page 2 of 2 -------------------------------------------------------------------------------- Vitals Details Patient Name: Date of Service: [name] 02/07/2023 11:30 A M Medical Record Number: [id number] Patient Account Number: [id number] Date of Birth/Sex: Treating RN: [birth year] (89 y.o. [name] Primary Care [name]: [name] Other Clinician: Haywood Pao Referring [name]: Treating [name]/Extender: [name] in Treatment: 31 Vital Signs Time Taken: 12:15 Temperature (F): 98.2 Height (in): 71 Pulse (bpm): 99 Weight (lbs): 170 Respiratory Rate (breaths/min): 18 Body Mass Index (BMI): 23.7 Blood Pressure (mmHg): 131/81 Capillary Blood Glucose (mg/dl): 630 Reference Range: 80 - 120 mg / dl Electronic Signature(s) Signed: 02/08/2023 3:32:30 PM By: Haywood Pao CHT EMT BS , , Entered By: Haywood Pao on 02/08/2023 15:32:29

## 2023-02-10 NOTE — Progress Notes (Signed)
SCIUTO, Dahmir ([id number]) [id number] Page 1 of 10 Visit Report for 02/08/2023 Arrival Information Details Patient Name: Date of Service: [name] [name] 02/08/2023 3:30 PM Medical Record Number: [id number] Patient Account Number: [id number] Date of Birth/Sex: Treating RN: 05/31/1933 (89 y.o. [name] Primary Care Daanya Lanphier: [name] Other Clinician: Referring Hendy Brindle: Treating Edras Wilford/Extender: [name] in Treatment: 31 Visit Information History Since Last Visit Added or deleted any medications: No Patient Arrived: Ambulatory Any new allergies or adverse reactions: No Arrival Time: 14:47 Had a fall or experienced change in No Accompanied By: granddaughter activities of daily living that may affect Transfer Assistance: None risk of falls: Patient Identification Verified: Yes Signs or symptoms of abuse/neglect since No Secondary Verification Process Completed: Yes last visito Patient Requires Transmission-Based Precautions: No Hospitalized since last visit: No Patient Has Alerts: Yes Implantable device outside of the clinic No Patient Alerts: Patient on Blood Thinner excluding cellular tissue based products placed in the center since last visit: Has Dressing in Place as Prescribed: Yes Has Footwear/Offloading in Place as Yes Prescribed: Right: Surgical Shoe with Pressure Relief Insole Pain Present Now: No Electronic Signature(s) Signed: 02/09/2023 4:52:59 PM By: Shawn Stall RN, BSN Entered By: Shawn Stall on 02/08/2023 11:48:08 -------------------------------------------------------------------------------- Encounter Discharge Information Details Patient Name: Date of Service: [name] [name] 02/08/2023 3:30 PM Medical Record Number: [id number] Patient Account Number: [id number] Date of Birth/Sex: Treating RN: 21-Jun-1933 (89 y.o. [name] Primary Care [name]: [name] Other Clinician: Referring [name]: Treating Libbey Duce/Extender: [name] in Treatment: 31 Encounter Discharge Information Items Post Procedure Vitals Discharge Condition: Stable Temperature (F): 97 Ambulatory Status: Ambulatory Pulse (bpm): 79 Discharge Destination: Home Respiratory Rate (breaths/min): 18 Transportation: Private Auto Blood Pressure (mmHg): 134/81 Accompanied By: granddaughter Schedule Follow-up Appointment: Yes Clinical Summary of Care: Electronic Signature(s) Signed: 02/09/2023 4:52:59 PM By: Shawn Stall RN, BSN Entered By: Shawn Stall on 02/08/2023 13:06:57 [place], Gumecindo ([id number]) [id number] Page 2 of 10 -------------------------------------------------------------------------------- Lower Extremity Assessment Details Patient Name: Date of Service: [name] [name] 02/08/2023 3:30 PM Medical Record Number: [id number] Patient Account Number: [id number] Date of Birth/Sex: Treating RN: 17-Feb-1934 (89 y.o. [name] Primary Care [name]: [name] Other Clinician: Referring [name]: Treating [name]/Extender: [name] in Treatment: 31 Edema Assessment Assessed: [name]: No] Franne Forts: Yes] Edema: [Left: N] [Right: o] Calf Left: Right: Point of Measurement: From Medial Instep 32 cm Ankle Left: Right: Point of Measurement: From Medial Instep 21 cm Vascular Assessment Pulses: Dorsalis Pedis Palpable: [Right:Yes] Extremity colors, hair growth, and conditions: Extremity Color: [Right:Normal] Hair Growth on Extremity: [Right:No] Temperature of Extremity: [Right:Warm] Capillary Refill: [Right:< 3 seconds] Dependent Rubor: [Right:No] Blanched when Elevated: [Right:No No] Toe Nail Assessment Left: Right: Thick: No Discolored: No Deformed: No Improper Length and Hygiene: No Electronic Signature(s) Signed: 02/09/2023 4:52:59 PM By:  Shawn Stall RN, BSN Entered By: Shawn Stall on 02/08/2023 11:50:26 -------------------------------------------------------------------------------- Multi Wound Chart Details Patient Name: Date of Service: [name] [name] 02/08/2023 3:30 PM Medical Record Number: [id number] Patient Account Number: [id number] Date of Birth/Sex: Treating RN: [birth year] (89 y.o. M) Primary Care [name]: [name] Other Clinician: Referring [name]: Treating [name]/Extender: [name] in Treatment: [address], [name] ([id number]):Photos:] [phone] Page 3 of 10:3 4 N/A N/A] Right Amputation Site - Toe Right, Lateral Foot N/A Wound Location: Gradually Appeared Shear/Friction N/A Wounding Event: Diabetic Wound/Ulcer of the Lower Diabetic Wound/Ulcer of the Lower N/A Primary Etiology:  Extremity Extremity N/A Pressure Ulcer N/A Secondary Etiology: Cataracts, Anemia, Asthma, Chronic Cataracts, Anemia, Asthma, Chronic N/A Comorbid History: Obstructive Pulmonary Disease Obstructive Pulmonary Disease (COPD), Peripheral Arterial Disease, (COPD), Peripheral Arterial Disease, Type II Diabetes, Gout, Osteoarthritis, Type II Diabetes, Gout, Osteoarthritis, Neuropathy Neuropathy 04/07/2022 11/21/2022 N/A Date Acquired: 31 9 N/A Weeks of Treatment: Open Open N/A Wound Status: No No N/A Wound Recurrence: 0.1x0.1x0.1 2.3x1.9x0.4 N/A Measurements L x W x D (cm) 0.008 3.432 N/A A (cm) : rea 0.001 1.373 N/A Volume (cm) : 99.70% 33.80% N/A % Reduction in A rea: 99.90% -165.10% N/A % Reduction in Volume: 8 Starting Position 1 (o'clock): 4 Ending Position 1 (o'clock): 0.4 Maximum Distance 1 (cm): No Yes N/A Undermining: Grade 3 Grade 2 N/A Classification: Small Medium N/A Exudate A mount: Serosanguineous Serosanguineous N/A Exudate Type: red, brown red, brown N/A Exudate Color: Epibole Distinct, outline attached  N/A Wound Margin: None Present (0%) Large (67-100%) N/A Granulation A mount: N/A Red, Pink N/A Granulation Quality: None Present (0%) Small (1-33%) N/A Necrotic A mount: Fat Layer (Subcutaneous Tissue): Yes Fat Layer (Subcutaneous Tissue): Yes N/A Exposed Structures: Fascia: No Tendon: Yes Tendon: No Bone: Yes Muscle: No Fascia: No Joint: No Muscle: No Bone: No Joint: No Large (67-100%) Small (1-33%) N/A Epithelialization: N/A Debridement - Excisional N/A Debridement: Pre-procedure Verification/Time Out N/A 15:15 N/A Taken: N/A Lidocaine 4% Topical Solution N/A Pain Control: N/A Subcutaneous, Slough N/A Tissue Debrided: N/A Skin/Subcutaneous Tissue N/A Level: N/A 3.43 N/A Debridement A (sq cm): rea N/A Curette N/A Instrument: N/A Minimum N/A Bleeding: N/A Pressure N/A Hemostasis A chieved: N/A 0 N/A Procedural Pain: N/A 0 N/A Post Procedural Pain: N/A Procedure was tolerated well N/A Debridement Treatment Response: N/A 2.3x1.9x0.4 N/A Post Debridement Measurements L x W x D (cm) N/A 1.373 N/A Post Debridement Volume: (cm) Callus: Yes Callus: Yes N/A Periwound Skin Texture: Excoriation: No Excoriation: No Induration: No Induration: No Crepitus: No Crepitus: No Rash: No Rash: No Scarring: No Scarring: No Maceration: No Maceration: Yes N/A Periwound Skin Moisture: Dry/Scaly: No Dry/Scaly: No Atrophie Blanche: No Atrophie Blanche: No N/A Periwound Skin Color: Cyanosis: No Cyanosis: No Ecchymosis: No Ecchymosis: No Erythema: No Erythema: No Hemosiderin Staining: No Hemosiderin Staining: No Mottled: No Mottled: No Pallor: No Pallor: No Rubor: No Rubor: No No Abnormality No Abnormality N/A Temperature: N/A Cellular or Tissue Based Product N/A Procedures Performed: Debridement NARRO, Talvin ([id number]) [id number] Page 4 of 10 Treatment Notes Wound #3 (Amputation Site - Toe) Wound Laterality:  Right Cleanser Vashe 5.8 (oz) Discharge Instruction: Cleanse the wound with Vashe prior to applying a clean dressing using gauze sponges, not tissue or cotton balls. Peri-Wound Care Topical Skintegrity Hydrogel 4 (oz) Discharge Instruction: Apply hydrogel as directed Primary Dressing Secondary Dressing Woven Gauze Sponges 2x2 in Discharge Instruction: Apply over primary dressing as directed. Secured With Paper Tape, 2x10 (in/yd) Discharge Instruction: Secure dressing with tape as directed. Compression Wrap Compression Stockings Add-Ons Wound #4 (Foot) Wound Laterality: Right, Lateral Cleanser Soap and Water Discharge Instruction: May shower and wash wound with dial antibacterial soap and water prior to dressing change. Wound Cleanser Discharge Instruction: Cleanse the wound with wound cleanser prior to applying a clean dressing using gauze sponges, not tissue or cotton balls. Peri-Wound Care Skin Prep Discharge Instruction: Use skin prep as directed Topical Primary Dressing apligraf #1 Discharge Instruction: applied by Eduarda Scrivens. cutimed sorbact swab with steristrip Discharge Instruction: leave in place. drawtex Discharge Instruction: leave in place. Secondary Dressing ABD Pad, 5x9 Discharge Instruction: Apply over primary  dressing as directed. Secured With [employer], 4.5x3.1 (in/yd) Discharge Instruction: Secure with Kerlix as directed. 32M Medipore H Soft Cloth Surgical T ape, 4 x 10 (in/yd) Discharge Instruction: Secure with tape as directed. Compression Wrap Compression Stockings Add-Ons Electronic Signature(s) Signed: 02/09/2023 4:45:28 AM By: [name] MD Entered By: [name] on 02/08/2023 13:44:04 Ormiston, Saben ([id number]) [id number] Page 5 of 10 -------------------------------------------------------------------------------- Multi-Disciplinary Care Plan Details Patient Name: Date of Service: [name] [name] 02/08/2023 3:30 PM Medical Record Number: [id number] Patient Account Number: [id number] Date of Birth/Sex: Treating RN: 1934/01/29 (89 y.o. [name] Primary Care Gionni Vaca: [name] Other Clinician: Referring Donisha Hoch: Treating Sydney Hasten/Extender: [name] in Treatment: 31 Active Inactive Wound/Skin Impairment Nursing Diagnoses: Impaired tissue integrity Knowledge deficit related to ulceration/compromised skin integrity Goals: Patient will have a decrease in wound volume by X% from date: (specify in notes) Date Initiated: 07/04/2022 Target Resolution Date: 03/03/2023 Goal Status: Active Patient/caregiver will verbalize understanding of skin care regimen Date Initiated: 07/04/2022 Target Resolution Date: 03/03/2023 Goal Status: Active Ulcer/skin breakdown will have a volume reduction of 30% by week 4 Date Initiated: 07/04/2022 Date Inactivated: 10/27/2022 Target Resolution Date: 11/05/2022 Goal Status: Met Ulcer/skin breakdown will have a volume reduction of 50% by week 8 Date Initiated: 07/04/2022 Date Inactivated: 10/27/2022 Target Resolution Date: 11/05/2022 Goal Status: Met Interventions: Assess patient/caregiver ability to obtain necessary supplies Assess patient/caregiver ability to perform ulcer/skin care regimen upon admission and as needed Assess ulceration(s) every visit Notes: Electronic Signature(s) Signed: 02/09/2023 4:52:59 PM By: Shawn Stall RN, BSN Entered By: Shawn Stall on 02/08/2023 12:02:58 -------------------------------------------------------------------------------- Pain Assessment Details Patient Name: Date of Service: [name] [name] 02/08/2023 3:30 PM Medical Record Number: [id number] Patient Account Number: [id number] Date of Birth/Sex: Treating RN: 04/22/1933 (89 y.o. [name] Primary Care Danajah Birdsell: [name] Other Clinician: Referring Kenzly Rogoff: Treating Mali Eppard/Extender:  [name] in Treatment: 31 Active Problems Location of Pain Severity and Description of Pain Patient Has Paino Yes Site Locations Pain Location: ERMIAS, SANDIEGO ([id number]) [id number] Page 6 of 10 Pain Location: Pain in Ulcers Rate the pain. Current Pain Level: 2 Character of Pain Describe the Pain: Sharp Pain Management and Medication Current Pain Management: Medication: No Cold Application: No Rest: No Massage: No Activity: No T.E.N.S.: No Heat Application: No Leg drop or elevation: No Is the Current Pain Management Adequate: Adequate How does your wound impact your activities of daily livingo Sleep: No Bathing: No Appetite: No Relationship With Others: No Bladder Continence: No Emotions: No Bowel Continence: No Work: No Toileting: No Drive: No Dressing: No Hobbies: No Psychologist, prison and probation services) Signed: 02/09/2023 4:52:59 PM By: Shawn Stall RN, BSN Entered By: Shawn Stall on 02/08/2023 11:48:46 -------------------------------------------------------------------------------- Patient/Caregiver Education Details Patient Name: Date of Service: [name] [name] 12/4/2024andnbsp3:30 PM Medical Record Number: [id number] Patient Account Number: [id number] Date of Birth/Gender: Treating RN: [birth year] (89 y.o. [name] Primary Care Physician: [name] Other Clinician: Referring Physician: Treating Physician/Extender: [name] in Treatment: 31 Education Assessment Education Provided To: Patient Education Topics Provided Wound/Skin Impairment: Handouts: Caring for Your Ulcer Methods: Explain/Verbal Responses: Reinforcements needed Electronic Signature(s) Signed: 02/09/2023 4:52:59 PM By: Shawn Stall RN, BSN Liddell, Vinay 4:52:59 PM By: Shawn Stall RN, BSN Signed: 02/09/2023 ([id number]) [id number] Page 7 of 10 Entered By:  Shawn Stall on 02/08/2023 12:03:16 -------------------------------------------------------------------------------- Wound Assessment Details Patient Name: Date of Service: [name] [name] 02/08/2023 3:30 PM Medical Record Number:  [id number] Patient Account Number: [id number] Date of Birth/Sex: Treating RN: 08-30-1933 (89 y.o. [name] Primary Care Eilee Schader: [name] Other Clinician: Referring Xena Propst: Treating Tiena Manansala/Extender: [name] in Treatment: 31 Wound Status Wound Number: 3 Primary Diabetic Wound/Ulcer of the Lower Extremity Etiology: Wound Location: Right Amputation Site - Toe Wound Open Wounding Event: Gradually Appeared Status: Date Acquired: 04/07/2022 Comorbid Cataracts, Anemia, Asthma, Chronic Obstructive Pulmonary Weeks Of Treatment: 31 History: Disease (COPD), Peripheral Arterial Disease, Type II Diabetes, Clustered Wound: No Gout, Osteoarthritis, Neuropathy Photos Wound Measurements Length: (cm) 0.1 Width: (cm) 0.1 Depth: (cm) 0.1 Area: (cm) 0.008 Volume: (cm) 0.001 % Reduction in Area: 99.7% % Reduction in Volume: 99.9% Epithelialization: Large (67-100%) Tunneling: No Undermining: No Wound Description Classification: Grade 3 Wound Margin: Epibole Exudate Amount: Small Exudate Type: Serosanguineous Exudate Color: red, brown Foul Odor After Cleansing: No Slough/Fibrino No Wound Bed Granulation Amount: None Present (0%) Exposed Structure Necrotic Amount: None Present (0%) Fascia Exposed: No Fat Layer (Subcutaneous Tissue) Exposed: Yes Tendon Exposed: No Muscle Exposed: No Joint Exposed: No Bone Exposed: No Periwound Skin Texture Texture Color No Abnormalities Noted: No No Abnormalities Noted: No Callus: Yes Atrophie Blanche: No Crepitus: No Cyanosis: No Excoriation: No Ecchymosis: No Induration: No Erythema: No Rash: No Hemosiderin Staining: No Scarring: No Mottled: No Pallor:  No Henrikson, Sargon ([id number]) [id number] Page 8 of 10 Pallor: No Moisture Rubor: No No Abnormalities Noted: No Dry / Scaly: No Temperature / Pain Maceration: No Temperature: No Abnormality Treatment Notes Wound #3 (Amputation Site - Toe) Wound Laterality: Right Cleanser Vashe 5.8 (oz) Discharge Instruction: Cleanse the wound with Vashe prior to applying a clean dressing using gauze sponges, not tissue or cotton balls. Peri-Wound Care Topical Skintegrity Hydrogel 4 (oz) Discharge Instruction: Apply hydrogel as directed Primary Dressing Secondary Dressing Woven Gauze Sponges 2x2 in Discharge Instruction: Apply over primary dressing as directed. Secured With Paper Tape, 2x10 (in/yd) Discharge Instruction: Secure dressing with tape as directed. Compression Wrap Compression Stockings Add-Ons Electronic Signature(s) Signed: 02/09/2023 4:52:59 PM By: Shawn Stall RN, BSN Entered By: Shawn Stall on 02/08/2023 12:00:22 -------------------------------------------------------------------------------- Wound Assessment Details Patient Name: Date of Service: [name] [name] 02/08/2023 3:30 PM Medical Record Number: [id number] Patient Account Number: [id number] Date of Birth/Sex: Treating RN: 1933/11/03 (89 y.o. [name] Primary Care Madylyn Insco: [name] Other Clinician: Referring Cristal Qadir: Treating Scotty Pinder/Extender: [name] in Treatment: 31 Wound Status Wound Number: 4 Primary Diabetic Wound/Ulcer of the Lower Extremity Etiology: Wound Location: Right, Lateral Foot Secondary Pressure Ulcer Wounding Event: Shear/Friction Etiology: Date Acquired: 11/21/2022 Wound Open Weeks Of Treatment: 9 Status: Clustered Wound: No Comorbid Cataracts, Anemia, Asthma, Chronic Obstructive Pulmonary History: Disease (COPD), Peripheral Arterial Disease, Type II Diabetes, Gout, Osteoarthritis,  Neuropathy Photos KISE, Jayln ([id number]) [phone].pdf Page 9 of 10 Wound Measurements Length: (cm) 2.3 Width: (cm) 1.9 Depth: (cm) 0.4 Area: (cm) 3.432 Volume: (cm) 1.373 % Reduction in Area: 33.8% % Reduction in Volume: -165.1% Epithelialization: Small (1-33%) Tunneling: No Undermining: Yes Starting Position (o'clock): 8 Ending Position (o'clock): 4 Maximum Distance: (cm) 0.4 Wound Description Classification: Grade 2 Wound Margin: Distinct, outline attached Exudate Amount: Medium Exudate Type: Serosanguineous Exudate Color: red, brown Foul Odor After Cleansing: No Slough/Fibrino No Wound Bed Granulation Amount: Large (67-100%) Exposed Structure Granulation Quality: Red, Pink Fascia Exposed: No Necrotic Amount: Small (1-33%) Fat Layer (Subcutaneous Tissue) Exposed: Yes Necrotic Quality: Adherent Slough Tendon Exposed: Yes Muscle Exposed: No Joint Exposed: No Bone Exposed: Yes Periwound Skin Texture Texture  Color No Abnormalities Noted: No No Abnormalities Noted: No Callus: Yes Atrophie Blanche: No Crepitus: No Cyanosis: No Excoriation: No Ecchymosis: No Induration: No Erythema: No Rash: No Hemosiderin Staining: No Scarring: No Mottled: No Pallor: No Moisture Rubor: No No Abnormalities Noted: No Dry / Scaly: No Temperature / Pain Maceration: Yes Temperature: No Abnormality Treatment Notes Wound #4 (Foot) Wound Laterality: Right, Lateral Cleanser Soap and Water Discharge Instruction: May shower and wash wound with dial antibacterial soap and water prior to dressing change. Wound Cleanser Discharge Instruction: Cleanse the wound with wound cleanser prior to applying a clean dressing using gauze sponges, not tissue or cotton balls. Peri-Wound Care Skin Prep Discharge Instruction: Use skin prep as directed Topical Primary Dressing apligraf #1 Discharge Instruction: applied by Lymon Kidney. CRAVER, Graeden  ([id number]) [id number] Page 10 of 10 cutimed sorbact swab with steristrip Discharge Instruction: leave in place. drawtex Discharge Instruction: leave in place. Secondary Dressing ABD Pad, 5x9 Discharge Instruction: Apply over primary dressing as directed. Secured With [employer], 4.5x3.1 (in/yd) Discharge Instruction: Secure with Kerlix as directed. 43M Medipore H Soft Cloth Surgical T ape, 4 x 10 (in/yd) Discharge Instruction: Secure with tape as directed. Compression Wrap Compression Stockings Add-Ons Electronic Signature(s) Signed: 02/09/2023 4:52:59 PM By: Shawn Stall RN, BSN Entered By: Shawn Stall on 02/08/2023 11:59:44 -------------------------------------------------------------------------------- Vitals Details Patient Name: Date of Service: [name] [name] 02/08/2023 3:30 PM Medical Record Number: [id number] Patient Account Number: [id number] Date of Birth/Sex: Treating RN: April 25, 1933 (89 y.o. [name] Primary Care Mitul Hallowell: [name] Other Clinician: Referring Alisen Marsiglia: Treating Lesa Vandall/Extender: [name] in Treatment: 31 Vital Signs Time Taken: 14:48 Reference Range: 80 - 120 mg / dl Height (in): 71 Weight (lbs): 170 Body Mass Index (BMI): 23.7 Notes see post HBO vital signs. Electronic Signature(s) Signed: 02/09/2023 4:52:59 PM By: Shawn Stall RN, BSN Entered By: Shawn Stall on 02/08/2023 11:48:23

## 2023-02-10 NOTE — Progress Notes (Signed)
[name], [name] ([id number]) [id number] Page 1 of 11 Visit Report for 02/08/2023 Cellular or Tissue Based Product Details Patient Name: Date of Service: [name] 02/08/2023 3:30 PM Medical Record Number: [id number] Patient Account Number: [id number] Date of Birth/Sex: Treating RN: [birth year] (89 y.o. [name] Primary Care Provider: Bess [name] Other Clinician: Referring Provider: Treating Provider/Extender: [name] in Treatment: 31 Cellular or Tissue Based Product Type Wound #4 Right,Lateral Foot Applied to: Performed By: Physician [name]., MD The following information was scribed by: [name] The information was scribed for: [name] Cellular or Tissue Based Product Type: Apligraf Level of Consciousness (Pre-procedure): Awake and Alert Pre-procedure Verification/Time Out Yes - 15:32 Taken: Location: genitalia / hands / feet / multiple digits Wound Size (sq cm): 4.37 Product Size (sq cm): 44 Waste Size (sq cm): 0 Amount of Product Applied (sq cm): 44 Instrument Used: Forceps, Scissors Lot #: [id number] Order #: 1 Expiration Date: 02/08/2023 Fenestrated: Yes Instrument: Blade Reconstituted: Yes Solution Type: normal saline Solution Amount: 10mL Lot #: [id number] Solution Expiration Date: 06/25/2024 Secured: Yes Secured With: Steri-Strips, cutimed sorbact swab Dressing Applied: No Procedural Pain: 0 Post Procedural Pain: 0 Response to Treatment: Procedure was tolerated well Level of Consciousness (Post- Awake and Alert procedure): Post Procedure Diagnosis Same as Pre-procedure Notes Apligraf pH within acceptable range at 6.8-7.3. Date of Apligraf 02/07/23. Phoned organogenesis, per manufacture guidelines acceptable to use as long as the pH within in range, provider and patient in agreement to use. Applied today in clinic by provider. Electronic  Signature(s) Signed: 02/09/2023 4:45:28 AM By: [name] Najjar MD Signed: 02/09/2023 4:52:59 PM By: [name] Stall RN, BSN Entered By: [name] on 02/08/2023 16:01:40 -------------------------------------------------------------------------------- Debridement Details Patient Name: Date of Service: [name] 02/08/2023 3:30 PM [name], [name] ([id number]) [id number] Page 2 of 11 Medical Record Number: [id number] Patient Account Number: [id number] Date of Birth/Sex: Treating RN: 19-Jun-1933 (89 y.o. M) Primary Care Provider: Bess [name] Other Clinician: Referring Provider: Treating Provider/Extender: [name] in Treatment: 31 Debridement Performed for Assessment: Wound #4 Right,Lateral Foot Performed By: Physician [name]., MD The following information was scribed by: [name] The information was scribed for: [name] Debridement Type: Debridement Severity of Tissue Pre Debridement: Bone involvement without necrosis Level of Consciousness (Pre-procedure): Awake and Alert Pre-procedure Verification/Time Out Yes - 15:15 Taken: Start Time: 15:16 Pain Control: Lidocaine 4% T opical Solution Percent of Wound Bed Debrided: 100% T Area Debrided (cm): otal 3.43 Tissue and other material debrided: Viable, Non-Viable, Slough, Subcutaneous, Slough Level: Skin/Subcutaneous Tissue Debridement Description: Excisional Instrument: Curette Bleeding: Minimum Hemostasis Achieved: Pressure End Time: 15:30 Procedural Pain: 0 Post Procedural Pain: 0 Response to Treatment: Procedure was tolerated well Level of Consciousness (Post- Awake and Alert procedure): Post Debridement Measurements of Total Wound Length: (cm) 2.3 Width: (cm) 1.9 Depth: (cm) 0.4 Volume: (cm) 1.373 Character of Wound/Ulcer Post Debridement: Improved Severity of Tissue Post Debridement: Bone involvement without necrosis Post  Procedure Diagnosis Same as Pre-procedure Electronic Signature(s) Signed: 02/09/2023 4:45:28 AM By: [name] Najjar MD Entered By: [name] on 02/08/2023 16:44:25 -------------------------------------------------------------------------------- HPI Details Patient Name: Date of Service: [name] 02/08/2023 3:30 PM Medical Record Number: [id number] Patient Account Number: [id number] Date of Birth/Sex: Treating RN: 1933/12/16 (89 y.o. M) Primary Care Provider: Bess [name] Other Clinician: Referring Provider: Treating Provider/Extender: [name] in Treatment: 31 History of Present Illness HPI Description: 07/04/2022 [name]  is an 89 year old male with a past medical history of uncontrolled, insulin dependent type 2 diabetes, CKD stage III, and COPD that presents to the clinic for a right foot wound. He has an extensive history of peripheral arterial disease to the right leg. He has had a right superficial femoral to posterior tibial artery bypass by Dr. Elonda [name] in 2020. He had a right common femoral to plantar posterior tibial artery bypass on 02/03/2022 by Dr. Lenell [name] and at that time he also had a right toe amputation. He states this wound site had healed completely. Prior to this he had amputations to the first and second digit to the right foot. He presents today with an open wound to the previous right toe amputation site for the past 2 months. He has been following with vein and vascular for this issue. Most recent ABIs were on 06/22/2022 that showed absent waveforms in the right lower extremity. Currently his right lower extremity bypass is occluded and no plan for another anteriorgram and has he likely has no revascularization options. Currently he has been using Betadine to the wound bed. He denies signs of infection. [name], [name] ([id number]) [id number] Page 3 of 11 07/11/2022 patient  arrives in clinic today using Hydrofera Blue on the toe amputation site. The patient is revascularized and is not felt to have any other options for revascularization. We are using Hydrofera Blue and gauze. 5/13; patient presents for follow-up. He has been using Hydrofera Blue to the toe amputation site. He has had no issues. Wound is smaller. He has been approved for [facility] and was agreeable to have this placed in office today. 5/20; patient presents for follow-up. We have been using Kerecis to the wound bed. There is been improvement in healing. Unfortunately patient did not change the outer dressing for a week. There is slight maceration to the periwound. Unclear why he did not do this. 5/30; patient presents for follow-up. We have been using Kerecis to the wound bed. More maceration to the periwound today. No signs of infection. 6/10; patient presents for follow-up. He has been using Hydrofera Blue to the wound bed. He has no issues or complaints today. Maceration has almost completely resolved. 6/18; patient presents for follow-up. He has been using blast X and collagen to the wound bed without issues. Wound appears smaller today. 6/28; patient presents for follow-up. He has been using blast X and collagen to the wound bed. The wound is smaller. 7/11; patient presents for follow-up. He has been using blast X and collagen to the wound bed. Wound is smaller. He has slough accumulation. 7/25; patient presents for follow-up. He has been using blast X and collagen to the wound bed. Wound is stable. He denies signs of infection. 8/26; patient presents for follow-up. He has been using blast X and collagen to the wound bed. Wound is slightly smaller. He has no issues or complaints. 8/29; patient presents for follow-up. He has been using blast X and Aquacel Ag to the wound bed. He has been using a Velcro wrap at his toes to keep the toes closer together to help with wound healing. He has no issues or  complaints today. Wound is smaller. 9/12; patient presents for follow-up. He has been using blast X and Aquacel Ag to the wound bed. Unfortunately he has developed a pressure injury to the lateral aspect of the foot. He is not sure how this started. No open wound there. He denies pain. 9/23; this patient has a wound on the  right foot at the amputation site of her third toe. It is right up against the fourth toe making it somewhat difficult to dress. When he was here last time he had a blister/DTI on the lateral aspect of the fifth MTP 9/30; patient has a wound on the right foot amputation site of his third toe. Probably in the third fourth webspace. I changed him to endoform last week. The wound looks somewhat better The area over the lateral part of the fifth metatarsal head which was blistered with some central deep tissue looking injury was leaking fluid today. The blister was unroofed. I used pickups and scissors to remove this. I do not know that there is anything technically open here although there is certainly some central tissue at risk 10/7; his original wound at the amputation site in his third fourth webspace looks stable to improved. We have been using endoform. The real concern today is the area over the lateral part of the fifth metatarsal head. This initially looked like a blister with central deep tissue injury I unroofed the blister last week there was nothing technically open but the deep tissue injury was concerning. T oday he comes in with a necrotic surface I remove this. The central part of this area which I am assuming was the deep tissue injury is necrotic I removed some of the necrotic tissue with a #15 scalpel and pickups. There is undermining around the wound edge here. 10/14; his original wound at the amputation site of his third fourth toe webspace looks improved we have gradually been making progress here. Using endoform. Unfortunately the area over the lateral part of  the right fifth metatarsal head continues to deteriorate. Necrotic surface last week continues this week. The PCR culture I did last week did not manage to make it to the right lab. I have looked at his x-ray the bone underneath does not look that bad to me however we have not yet had a radiologist interpretation. We have been using Iodoflex this week. 10/21; the original amputation site of his third fourth toe webspace continues to look reasonably healthy and making slow but steady progress we have been using endoform The area on the right lateral fifth metatarsal head has been deteriorating problem. PCR culture grew MRSA. I have him on doxycycline. There is exposed bone in the wound under necrotic material. I still do not have the x-ray back from October 7 at least in terms of the report. Addendum 12/27/2022; I have looked back on this patient's vascular status. He is status post a right common femoral to posterior tibial artery bypass in 2020. He had a right common femoral to plantar posterior tibial bypass by Dr. Lenell [name] in November 2023. He also had a right third toe amputation at the same time. The wound that we have been dealing with the third toe amputation site was present after the surgery. He was last seen by vascular surgery on 06/21/2022 and was supposed to follow-up with Dr. Lenell [name] in 3 weeks although I do not see that appointment. I have included the major findings in the last right lower extremity angiogram on 01/21/2022 10/28;Marland Kitchen The x-ray report that I ordered several weeks ago is back. Indeed there was nothing under the fifth met head on the right however notable for bone necrosis under the wound on the left third toe. This suggest osteomyelitis. I am going to extend his doxycycline for 3 further weeks. PCR culture on the lateral foot grew MRSA I sent  a secure text message to Dr. Juanetta [name]. His reply was that there was no additional options for revascularization. The patient went to see  Dr. Juanetta [name] and that is the message they came up with. He did offer him an amputation [o Above-knee]. We have been using endoform to the third toe amputation site. Silver alginate to the new wound over the fifth metatarsal head 11/4; patient continues on doxycycline which she is handling well. He started hyperbaric oxygen today and tolerated the first treatment well. We have been using endoform to the third toe amputation site which was the original wound he came to the clinic for. We are using Santyl and silver alginate to the wound over the fifth metatarsal head which was the wound that opened while he was under observation. Culture grew MRSA and the wound over the fifth metatarsal head 11/11 continuing doxycycline. He is tolerating hyperbarics although I am continually notified about tachycardia in the 100-110 range. Looking back on EKGs shows this is a longstanding problem sinus tachycardia. He also has left anterior fascicular block. He does not describe chest pain The original wound on the third toe amputation site looks a lot better this is contracting however the area on the right lateral fifth metatarsal head only has a rim of height of granulation tissue around what looks to be periosteum 11/18; continuing doxycycline. He is tolerating hyperbarics. The original wound on the third toe amputation site is very small and healthy however the wound the developed on the right lateral fifth met head which probably was a combination of infection and ischemic has a nonviable surface. We have been using Hydrofera Blue and Santyl he continues on doxycycline. The doxycycline was same that the x-ray from 10/31 showing high-grade phalangeal bone erosion suspicious for osteomyelitis. At that time there was nothing demonstrated on the fifth met head. I have repeated the x-ray of the foot looking specifically at the fifth met head however the report is not available. STITTS, [name] ([id number])  [id number] Page 4 of 11 12/3; the patient's original wound on the amputation site of his third toe is closed overall though it is a slight surface eschar I did not remove this. The problematic area on the right lateral fifth metatarsal head remains the major issue here. He is not a candidate for further revascularization. He has what looks to be some viable granulation over the bone which is an improvement. However extending into the superior part of his foot a large open area over the bone itself. We used Apligraf #1 today. He continues with HBO Electronic Signature(s) Signed: 02/09/2023 4:45:28 AM By: [name] Najjar MD Entered By: [name] on 02/08/2023 16:45:49 -------------------------------------------------------------------------------- Physical Exam Details Patient Name: Date of Service: [name] 02/08/2023 3:30 PM Medical Record Number: [id number] Patient Account Number: [id number] Date of Birth/Sex: Treating RN: 1933/09/15 (89 y.o. M) Primary Care Provider: Bess [name] Other Clinician: Referring Provider: Treating Provider/Extender: [name] in Treatment: 31 Notes Wound exam; the third metatarsal head amputation site has a very small amount of eschar over the surface but no identifiable wound I did not debride this The area on the right fifth metatarsal head has some more viable granulation however the wound extends over the bone itself. I removed some floating tendon in this area. Minimal bleeding. We did with We then applied Apligraf with careful attention to tucking it in the overhanging area anteriorly. Electronic Signature(s) Signed: 02/09/2023 4:45:28 AM By: [name] Najjar MD Entered By: [name]  on 02/08/2023 16:46:57 -------------------------------------------------------------------------------- Physician Orders Details Patient Name: Date of Service: [name]  02/08/2023 3:30 PM Medical Record Number: [id number] Patient Account Number: [id number] Date of Birth/Sex: Treating RN: January 12, 1934 (89 y.o. [name] Primary Care Provider: Bess [name] Other Clinician: Referring Provider: Treating Provider/Extender: [name] in Treatment: 31 The following information was scribed by: [name] The information was scribed for: [name] Verbal / Phone Orders: No Diagnosis Coding ICD-10 Coding Code Description L97.512 Non-pressure chronic ulcer of other part of right foot with fat layer exposed L97.514 Non-pressure chronic ulcer of other part of right foot with necrosis of bone E11.621 Type 2 diabetes mellitus with foot ulcer I70.235 Atherosclerosis of native arteries of right leg with ulceration of other part of foot Follow-up Appointments ppointment in 1 week. - Dr. Leanord Hawking 02/14/23 3pm (front office to schedule) Return A ppointment in 2 weeks. - Dr. Leanord Hawking 02/21/2023 330pm (front office to schedule) Return A Return appointment in 3 weeks. - Dr. Leanord Hawking 02/27/2023 315pm (front office schedule) Other: [employer] [place], Nadara Mode ([id number]) [id number] Page 5 of 11 Anesthetic (In clinic) Topical Lidocaine 5% applied to wound bed Cellular or Tissue Based Products Wound #4 Right,Lateral Foot Cellular or Tissue Based Product Type: - apligraf-applied #1 02/08/2023 Bathing/ Shower/ Hygiene May shower with protection but do not get wound dressing(s) wet. Protect dressing(s) with water repellant cover (for example, large plastic bag) or a cast cover and may then take shower. Off-Loading Open toe surgical shoe to: - right foot. wear while walking and standing. minimize pressure and walking/ standing. Hyperbaric Oxygen Therapy Wound #3 Right Amputation Site - Toe Evaluate for HBO Therapy Indication: - Wagner Grade 3 If appropriate for treatment, begin HBOT per  protocol: 2.0 ATA for 90 Minutes without A Breaks ir Total Number of Treatments: - 30 One treatments per day (delivered Monday through Friday unless otherwise specified in Special Instructions below): Finger stick Blood Glucose Pre- and Post- HBOT Treatment. Follow Hyperbaric Oxygen Glycemia Protocol Give two 4oz orange juices in addition to Glucerna when the glycemic protocol is used. A frin (Oxymetazoline HCL) 0.05% nasal spray - 1 spray in both nostrils daily as needed prior to HBO treatment for difficulty clearing ears Wound Treatment Wound #3 - Amputation Site - Toe Wound Laterality: Right Cleanser: Vashe 5.8 (oz) Every Other Day/30 Days Discharge Instructions: Cleanse the wound with Vashe prior to applying a clean dressing using gauze sponges, not tissue or cotton balls. Topical: Skintegrity Hydrogel 4 (oz) Every Other Day/30 Days Discharge Instructions: Apply hydrogel as directed Secondary Dressing: Woven Gauze Sponges 2x2 in Every Other Day/30 Days Discharge Instructions: Apply over primary dressing as directed. Secured With: Paper Tape, 2x10 (in/yd) Every Other Day/30 Days Discharge Instructions: Secure dressing with tape as directed. Wound #4 - Foot Wound Laterality: Right, Lateral Cleanser: Soap and Water 1 x Per Day/30 Days Discharge Instructions: May shower and wash wound with dial antibacterial soap and water prior to dressing change. Cleanser: Wound Cleanser 1 x Per Day/30 Days Discharge Instructions: Cleanse the wound with wound cleanser prior to applying a clean dressing using gauze sponges, not tissue or cotton balls. Peri-Wound Care: Skin Prep 1 x Per Day/30 Days Discharge Instructions: Use skin prep as directed Prim Dressing: apligraf #1 1 x Per Day/30 Days ary Discharge Instructions: applied by provider. Prim Dressing: cutimed sorbact swab with steristrip 1 x Per Day/30 Days ary Discharge Instructions: leave in place. Prim Dressing: drawtex 1 x Per Day/30  Days ary Discharge Instructions: leave in place. Secondary Dressing: ABD Pad, 5x9 1 x Per Day/30 Days Discharge Instructions: Apply over primary dressing as directed. Secured With: [employer], 4.5x3.1 (in/yd) 1 x Per Day/30 Days Discharge Instructions: Secure with Kerlix as directed. Secured With: 49M Medipore H Soft Cloth Surgical T ape, 4 x 10 (in/yd) 1 x Per Day/30 Days Discharge Instructions: Secure with tape as directed. GLYCEMIA INTERVENTIONS PROTOCOL PRE-HBO GLYCEMIA INTERVENTIONS ACTION INTERVENTION Obtain pre-HBO capillary blood glucose (ensure 1 physician order is in chart). A. Notify HBO physician and await physician orders. [name], [name] ([id number]) [id number] Page 6 of 11 orders. 2 If result is 70 mg/dl or below: B. If the result meets the hospital definition of a critical result, follow hospital policy. A. Give patient an 8 ounce Glucerna Shake, an 8 ounce Ensure, or 8 ounces of a Glucerna/Ensure equivalent dietary supplement*. B. Wait 30 minutes. If result is 71 mg/dl to 409 mg/dl: C. Retest patients capillary blood glucose (CBG). D. If result greater than or equal to 110 mg/dl, proceed with HBO. If result less than 110 mg/dl, notify HBO physician and consider holding HBO. If result is 131 mg/dl to 811 mg/dl: A. Proceed with HBO. A. Notify HBO physician and await physician orders. B. It is recommended to hold HBO and do If result is 250 mg/dl or greater: blood/urine ketone testing. C. If the result meets the hospital definition of a critical result, follow hospital policy. POST-HBO GLYCEMIA INTERVENTIONS ACTION INTERVENTION Obtain post HBO capillary blood glucose (ensure 1 physician order is in chart). A. Notify HBO physician and await physician orders. 2 If result is 70 mg/dl or below: B. If the result meets the hospital definition of a critical result, follow hospital policy. A. Give patient an 8 ounce  Glucerna Shake, an 8 ounce Ensure, or 8 ounces of a Glucerna/Ensure equivalent dietary supplement*. B. Wait 15 minutes for symptoms of If result is 71 mg/dl to 914 mg/dl: hypoglycemia (i.e. nervousness, anxiety, sweating, chills, clamminess, irritability, confusion, tachycardia or dizziness). C. If patient asymptomatic, discharge patient. If patient symptomatic, repeat capillary blood glucose (CBG) and notify HBO physician. If result is 101 mg/dl to 782 mg/dl: A. Discharge patient. A. Notify HBO physician and await physician orders. B. It is recommended to do blood/urine ketone If result is 250 mg/dl or greater: testing. C. If the result meets the hospital definition of a critical result, follow hospital policy. *Juice or candies are NOT equivalent products. If patient refuses the Glucerna or Ensure, please consult the hospital dietitian for an appropriate substitute. Electronic Signature(s) Signed: 02/09/2023 4:45:28 AM By: [name] Najjar MD Signed: 02/09/2023 4:52:59 PM By: [name] Stall RN, BSN Entered By: [name] on 02/08/2023 15:53:08 -------------------------------------------------------------------------------- Problem List Details Patient Name: Date of Service: [name] 02/08/2023 3:30 PM Medical Record Number: [id number] Patient Account Number: [id number] Date of Birth/Sex: Treating RN: Oct 20, 1933 (89 y.o. [name] Primary Care Provider: Bess [name] Other Clinician: Referring Provider: Treating Provider/Extender: [name] in Treatment: 31 Active Problems ICD-10 Encounter Code Description Active Date MDM Diagnosis L97.512 Non-pressure chronic ulcer of other part of right foot with fat layer exposed 07/04/2022 No Yes L97.514 Non-pressure chronic ulcer of other part of right foot with necrosis of bone 12/26/2022 No Yes [name], [name] ([id number]) [phone].pdf Page 7 of 11 E11.621  Type 2 diabetes mellitus with foot ulcer 07/04/2022 No Yes I70.235 Atherosclerosis of native arteries of right leg with ulceration of other part of 07/04/2022 No  Yes foot Inactive Problems Resolved Problems Electronic Signature(s) Signed: 02/09/2023 4:45:28 AM By: [name] Najjar MD Entered By: [name] on 02/08/2023 16:43:52 -------------------------------------------------------------------------------- Progress Note Details Patient Name: Date of Service: [name] 02/08/2023 3:30 PM Medical Record Number: [id number] Patient Account Number: [id number] Date of Birth/Sex: Treating RN: 1933/12/29 (89 y.o. M) Primary Care Provider: Bess [name] Other Clinician: Referring Provider: Treating Provider/Extender: [name] in Treatment: 31 Subjective History of Present Illness (HPI) 07/04/2022 [name] is an 89 year old male with a past medical history of uncontrolled, insulin dependent type 2 diabetes, CKD stage III, and COPD that presents to the clinic for a right foot wound. He has an extensive history of peripheral arterial disease to the right leg. He has had a right superficial femoral to posterior tibial artery bypass by Dr. Elonda [name] in 2020. He had a right common femoral to plantar posterior tibial artery bypass on 02/03/2022 by Dr. Lenell [name] and at that time he also had a right toe amputation. He states this wound site had healed completely. Prior to this he had amputations to the first and second digit to the right foot. He presents today with an open wound to the previous right toe amputation site for the past 2 months. He has been following with vein and vascular for this issue. Most recent ABIs were on 06/22/2022 that showed absent waveforms in the right lower extremity. Currently his right lower extremity bypass is occluded and no plan for another anteriorgram and has he likely has no revascularization options. Currently he has  been using Betadine to the wound bed. He denies signs of infection. 07/11/2022 patient arrives in clinic today using Hydrofera Blue on the toe amputation site. The patient is revascularized and is not felt to have any other options for revascularization. We are using Hydrofera Blue and gauze. 5/13; patient presents for follow-up. He has been using Hydrofera Blue to the toe amputation site. He has had no issues. Wound is smaller. He has been approved for [facility] and was agreeable to have this placed in office today. 5/20; patient presents for follow-up. We have been using Kerecis to the wound bed. There is been improvement in healing. Unfortunately patient did not change the outer dressing for a week. There is slight maceration to the periwound. Unclear why he did not do this. 5/30; patient presents for follow-up. We have been using Kerecis to the wound bed. More maceration to the periwound today. No signs of infection. 6/10; patient presents for follow-up. He has been using Hydrofera Blue to the wound bed. He has no issues or complaints today. Maceration has almost completely resolved. 6/18; patient presents for follow-up. He has been using blast X and collagen to the wound bed without issues. Wound appears smaller today. 6/28; patient presents for follow-up. He has been using blast X and collagen to the wound bed. The wound is smaller. 7/11; patient presents for follow-up. He has been using blast X and collagen to the wound bed. Wound is smaller. He has slough accumulation. 7/25; patient presents for follow-up. He has been using blast X and collagen to the wound bed. Wound is stable. He denies signs of infection. 8/26; patient presents for follow-up. He has been using blast X and collagen to the wound bed. Wound is slightly smaller. He has no issues or complaints. 8/29; patient presents for follow-up. He has been using blast X and Aquacel Ag to the wound bed. He has been using a Velcro wrap at  his  toes to keep the toes closer together to help with wound healing. He has no issues or complaints today. Wound is smaller. 9/12; patient presents for follow-up. He has been using blast X and Aquacel Ag to the wound bed. Unfortunately he has developed a pressure injury to the lateral aspect of the foot. He is not sure how this started. No open wound there. He denies pain. 9/23; this patient has a wound on the right foot at the amputation site of her third toe. It is right up against the fourth toe making it somewhat difficult to dress. [name], [name] ([id number]) [id number] Page 8 of 11 When he was here last time he had a blister/DTI on the lateral aspect of the fifth MTP 9/30; patient has a wound on the right foot amputation site of his third toe. Probably in the third fourth webspace. I changed him to endoform last week. The wound looks somewhat better The area over the lateral part of the fifth metatarsal head which was blistered with some central deep tissue looking injury was leaking fluid today. The blister was unroofed. I used pickups and scissors to remove this. I do not know that there is anything technically open here although there is certainly some central tissue at risk 10/7; his original wound at the amputation site in his third fourth webspace looks stable to improved. We have been using endoform. The real concern today is the area over the lateral part of the fifth metatarsal head. This initially looked like a blister with central deep tissue injury I unroofed the blister last week there was nothing technically open but the deep tissue injury was concerning. T oday he comes in with a necrotic surface I remove this. The central part of this area which I am assuming was the deep tissue injury is necrotic I removed some of the necrotic tissue with a #15 scalpel and pickups. There is undermining around the wound edge here. 10/14; his original wound at the  amputation site of his third fourth toe webspace looks improved we have gradually been making progress here. Using endoform. Unfortunately the area over the lateral part of the right fifth metatarsal head continues to deteriorate. Necrotic surface last week continues this week. The PCR culture I did last week did not manage to make it to the right lab. I have looked at his x-ray the bone underneath does not look that bad to me however we have not yet had a radiologist interpretation. We have been using Iodoflex this week. 10/21; the original amputation site of his third fourth toe webspace continues to look reasonably healthy and making slow but steady progress we have been using endoform The area on the right lateral fifth metatarsal head has been deteriorating problem. PCR culture grew MRSA. I have him on doxycycline. There is exposed bone in the wound under necrotic material. I still do not have the x-ray back from October 7 at least in terms of the report. Addendum 12/27/2022; I have looked back on this patient's vascular status. He is status post a right common femoral to posterior tibial artery bypass in 2020. He had a right common femoral to plantar posterior tibial bypass by Dr. Lenell [name] in November 2023. He also had a right third toe amputation at the same time. The wound that we have been dealing with the third toe amputation site was present after the surgery. He was last seen by vascular surgery on 06/21/2022 and was supposed to follow-up with Dr. Lenell [name]  in 3 weeks although I do not see that appointment. I have included the major findings in the last right lower extremity angiogram on 01/21/2022 10/28;Marland Kitchen The x-ray report that I ordered several weeks ago is back. Indeed there was nothing under the fifth met head on the right however notable for bone necrosis under the wound on the left third toe. This suggest osteomyelitis. I am going to extend his doxycycline for 3 further weeks. PCR culture  on the lateral foot grew MRSA I sent a secure text message to Dr. Juanetta [name]. His reply was that there was no additional options for revascularization. The patient went to see Dr. Juanetta [name] and that is the message they came up with. He did offer him an amputation [o Above-knee]. We have been using endoform to the third toe amputation site. Silver alginate to the new wound over the fifth metatarsal head 11/4; patient continues on doxycycline which she is handling well. He started hyperbaric oxygen today and tolerated the first treatment well. We have been using endoform to the third toe amputation site which was the original wound he came to the clinic for. We are using Santyl and silver alginate to the wound over the fifth metatarsal head which was the wound that opened while he was under observation. Culture grew MRSA and the wound over the fifth metatarsal head 11/11 continuing doxycycline. He is tolerating hyperbarics although I am continually notified about tachycardia in the 100-110 range. Looking back on EKGs shows this is a longstanding problem sinus tachycardia. He also has left anterior fascicular block. He does not describe chest pain The original wound on the third toe amputation site looks a lot better this is contracting however the area on the right lateral fifth metatarsal head only has a rim of height of granulation tissue around what looks to be periosteum 11/18; continuing doxycycline. He is tolerating hyperbarics. The original wound on the third toe amputation site is very small and healthy however the wound the developed on the right lateral fifth met head which probably was a combination of infection and ischemic has a nonviable surface. We have been using Hydrofera Blue and Santyl he continues on doxycycline. The doxycycline was same that the x-ray from 10/31 showing high-grade phalangeal bone erosion suspicious for osteomyelitis. At that time there was nothing demonstrated on the  fifth met head. I have repeated the x-ray of the foot looking specifically at the fifth met head however the report is not available. 12/3; the patient's original wound on the amputation site of his third toe is closed overall though it is a slight surface eschar I did not remove this. The problematic area on the right lateral fifth metatarsal head remains the major issue here. He is not a candidate for further revascularization. He has what looks to be some viable granulation over the bone which is an improvement. However extending into the superior part of his foot a large open area over the bone itself. We used Apligraf #1 today. He continues with HBO Objective Constitutional Vitals Time Taken: 2:48 PM, Height: 71 in, Weight: 170 lbs, BMI: 23.7. General Notes: see post HBO vital signs. Integumentary (Hair, Skin) Wound #3 status is Open. Original cause of wound was Gradually Appeared. The date acquired was: 04/07/2022. The wound has been in treatment 31 weeks. The wound is located on the Right Amputation Site - T The wound measures 0.1cm length x 0.1cm width x 0.1cm depth; 0.008cm^2 area and 0.001cm^3 volume. oe. There is Fat Layer (Subcutaneous  Tissue) exposed. There is no tunneling or undermining noted. There is a small amount of serosanguineous drainage noted. The wound margin is epibole. There is no granulation within the wound bed. There is no necrotic tissue within the wound bed. The periwound skin appearance exhibited: Callus. The periwound skin appearance did not exhibit: Crepitus, Excoriation, Induration, Rash, Scarring, Dry/Scaly, Maceration, Atrophie Blanche, Cyanosis, Ecchymosis, Hemosiderin Staining, Mottled, Pallor, Rubor, Erythema. Periwound temperature was noted as No Abnormality. Wound #4 status is Open. Original cause of wound was Shear/Friction. The date acquired was: 11/21/2022. The wound has been in treatment 9 weeks. The [place], [name] ([id number])  [id number] Page 9 of 11 wound is located on the Right,Lateral Foot. The wound measures 2.3cm length x 1.9cm width x 0.4cm depth; 3.432cm^2 area and 1.373cm^3 volume. There is bone, tendon, and Fat Layer (Subcutaneous Tissue) exposed. There is no tunneling noted, however, there is undermining starting at 8:00 and ending at 4:00 with a maximum distance of 0.4cm. There is a medium amount of serosanguineous drainage noted. The wound margin is distinct with the outline attached to the wound base. There is large (67-100%) red, pink granulation within the wound bed. There is a small (1-33%) amount of necrotic tissue within the wound bed including Adherent Slough. The periwound skin appearance exhibited: Callus, Maceration. The periwound skin appearance did not exhibit: Crepitus, Excoriation, Induration, Rash, Scarring, Dry/Scaly, Atrophie Blanche, Cyanosis, Ecchymosis, Hemosiderin Staining, Mottled, Pallor, Rubor, Erythema. Periwound temperature was noted as No Abnormality. 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 There was a Excisional Skin/Subcutaneous Tissue Debridement with a total area of 3.43 sq cm performed by [name]., MD. With the following instrument(s): Curette to remove Viable and Non-Viable tissue/material. Material removed includes Subcutaneous Tissue and Slough and after achieving pain control using Lidocaine 4% T opical Solution. A time out was conducted at 15:15, prior  to the start of the procedure. A Minimum amount of bleeding was controlled with Pressure. The procedure was tolerated well with a pain level of 0 throughout and a pain level of 0 following the procedure. Post Debridement Measurements: 2.3cm length x 1.9cm width x 0.4cm depth; 1.373cm^3 volume. Character of Wound/Ulcer Post Debridement is improved. Severity of Tissue Post Debridement is: Bone involvement without necrosis. Post procedure Diagnosis Wound #4: Same as Pre-Procedure Pre-procedure diagnosis of Wound #4 is a Diabetic Wound/Ulcer of the Lower Extremity located on the Right,Lateral Foot. A skin graft procedure using a bioengineered skin substitute/cellular or tissue based product was performed by [name]., MD with the following instrument(s): Forceps and Scissors. Apligraf was applied and secured with Steri-Strips and cutimed sorbact swab. 44 sq cm of product was utilized and 0 sq cm was wasted. Post Application, no dressing was applied. A Time Out was conducted at 15:32, prior to the start of the procedure. The procedure was tolerated well with a pain level of 0 throughout and a pain level of 0 following the procedure. Post procedure Diagnosis Wound #4: Same as Pre-Procedure General Notes: Apligraf pH within acceptable range at 6.8-7.3. Date of Apligraf 02/07/23. Phoned organogenesis,  per manufacture guidelines acceptable to use as long as the pH within in range, provider and patient in agreement to use. Applied today in clinic by provider. Plan Follow-up Appointments: Return Appointment in 1 week. - Dr. Leanord Hawking 02/14/23 3pm (front office to schedule) Return Appointment in 2 weeks. - Dr. Leanord Hawking 02/21/2023 330pm (front office to schedule) Return appointment in 3 weeks. - Dr. Leanord Hawking 02/27/2023 315pm (front office schedule) Other: - [employer] Anesthetic: (In clinic) Topical Lidocaine 5% applied to wound bed Cellular or Tissue Based Products: Wound #4 Right,Lateral  Foot: Cellular or Tissue Based Product Type: - apligraf-applied #1 02/08/2023 Bathing/ Shower/ Hygiene: May shower with protection but do not get wound dressing(s) wet. Protect dressing(s) with water repellant cover (for example, large plastic bag) or a cast cover and may then take shower. Off-Loading: Open toe surgical shoe to: - right foot. wear while walking and standing. minimize pressure and walking/ standing. Hyperbaric Oxygen Therapy: Wound #3 Right Amputation Site - T oe: Evaluate for HBO Therapy Indication: - Wagner Grade 3 If appropriate for treatment, begin HBOT per protocol: 2.0 ATA for 90 Minutes without Air Breaks T Number of Treatments: - 30 otal One treatments per day (delivered Monday through Friday unless otherwise specified in Special Instructions below): Finger stick Blood Glucose Pre- and Post- HBOT Treatment. Follow Hyperbaric Oxygen Glycemia Protocol Give two 4oz orange juices in addition to Glucerna when the glycemic protocol is used. Afrin (Oxymetazoline HCL) 0.05% nasal spray - 1 spray in both nostrils daily as needed prior to HBO treatment for difficulty clearing ears WOUND #3: - Amputation Site - T oe Wound Laterality: Right Cleanser: Vashe 5.8 (oz) Every Other Day/30 Days Discharge Instructions: Cleanse the wound with Vashe prior to applying a clean dressing using gauze sponges, not tissue or cotton balls. Topical: Skintegrity Hydrogel 4 (oz) Every Other Day/30 Days Discharge Instructions: Apply hydrogel as directed Secondary Dressing: Woven Gauze Sponges 2x2 in Every Other Day/30 Days Discharge Instructions: Apply over primary dressing as directed. [name], [name] ([id number]) [id number] Page 10 of 11 Secured With: Paper T ape, 2x10 (in/yd) Every Other Day/30 Days Discharge Instructions: Secure dressing with tape as directed. WOUND #4: - Foot Wound Laterality: Right, Lateral Cleanser: Soap and Water 1 x Per Day/30  Days Discharge Instructions: May shower and wash wound with dial antibacterial soap and water prior to dressing change. Cleanser: Wound Cleanser 1 x Per Day/30 Days Discharge Instructions: Cleanse the wound with wound cleanser prior to applying a clean dressing using gauze sponges, not tissue or cotton balls. Peri-Wound Care: Skin Prep 1 x Per Day/30 Days Discharge Instructions: Use skin prep as directed Prim Dressing: apligraf #1 1 x Per Day/30 Days ary Discharge Instructions: applied by provider. Prim Dressing: cutimed sorbact swab with steristrip 1 x Per Day/30 Days ary Discharge Instructions: leave in place. Prim Dressing: drawtex 1 x Per Day/30 Days ary Discharge Instructions: leave in place. Secondary Dressing: ABD Pad, 5x9 1 x Per Day/30 Days Discharge Instructions: Apply over primary dressing as directed. Secured With: [employer], 4.5x3.1 (in/yd) 1 x Per Day/30 Days Discharge Instructions: Secure with Kerlix as directed. Secured With: 63M Medipore H Soft Cloth Surgical T ape, 4 x 10 (in/yd) 1 x Per Day/30 Days Discharge Instructions: Secure with tape as directed. 1. Apligraf #1 2. Continue HBO 3. Healing the area in the right fifth metatarsal head will be truly miraculous however it is some better today as there is less obvious exposed bone Electronic Signature(s) Signed: 02/09/2023 4:45:28 AM  By: [name] Najjar MD Entered By: [name] on 02/08/2023 16:47:37 -------------------------------------------------------------------------------- SuperBill Details Patient Name: Date of Service: [name] 02/08/2023 Medical Record Number: [id number] Patient Account Number: [id number] Date of Birth/Sex: Treating RN: Jul 16, 1933 (89 y.o. [name] Primary Care Provider: Bess [name] Other Clinician: Referring Provider: Treating Provider/Extender: [name] in Treatment: 31 Diagnosis Coding ICD-10 Codes Code  Description [phone] Non-pressure chronic ulcer of other part of right foot with fat layer exposed L97.514 Non-pressure chronic ulcer of other part of right foot with necrosis of bone E11.621 Type 2 diabetes mellitus with foot ulcer I70.235 Atherosclerosis of native arteries of right leg with ulceration of other part of foot Facility Procedures : CPT4 Code: 32440102 Description: (Facility Use Only) Apligraf 1 SQ CM Modifier: Quantity: 44 : CPT4 Code: 72536644 Description: 15275 - SKIN SUB GRAFT FACE/NK/HF/G ICD-10 Diagnosis Description L97.514 Non-pressure chronic ulcer of other part of right foot with necrosis of bone Modifier: Quantity: 1 Physician Procedures Electronic Signature(s) Signed: 02/09/2023 4:45:28 AM By: [name] Najjar MD Entered By: [name] on 02/08/2023 16:47:55

## 2023-02-10 NOTE — Progress Notes (Signed)
POCH, Glenn ([id number]) [id number] Page 1 of 1 Visit Report for 02/09/2023 SuperBill Details Patient Name: Date of Service: [name] 02/09/2023 Medical Record Number: [id number] Patient Account Number: [id number] Date of Birth/Sex: Treating RN: 1933-03-14 (89 y.o. [name] Primary Care Provider: [name] Other Clinician: Haywood Pao Referring Provider: Treating Provider/Extender: [name] in Treatment: 31 Diagnosis Coding ICD-10 Codes Code Description [phone] Non-pressure chronic ulcer of other part of right foot with fat layer exposed L97.514 Non-pressure chronic ulcer of other part of right foot with necrosis of bone E11.621 Type 2 diabetes mellitus with foot ulcer I70.235 Atherosclerosis of native arteries of right leg with ulceration of other part of foot Facility Procedures CPT4 Code Description Modifier Quantity [id number] G0277-(Facility Use Only) HBOT full body chamber, , 4 ICD-10 Diagnosis Description E11.621 Type 2 diabetes mellitus with foot ulcer 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.512 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.235 Atherosclerosis of native arteries of right leg with ulceration of other part of foot Physician Procedures Quantity CPT4 Code Description Modifier [id number] - WC PHYS HYPERBARIC OXYGEN THERAPY 1 ICD-10 Diagnosis Description E11.621 Type 2 diabetes mellitus with foot ulcer 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.512 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.235 Atherosclerosis of native arteries of right leg with ulceration of other part of foot Electronic Signature(s) Signed: 02/09/2023 3:41:08 PM By: Haywood Pao CHT EMT BS , , Signed: 02/09/2023  5:41:20 PM By: [name] MD Previous Signature: 02/09/2023 3:34:00 PM Version By: Haywood Pao CHT EMT BS , , Entered By: Haywood Pao on 02/09/2023 15:41:07

## 2023-02-10 NOTE — Progress Notes (Signed)
[name], [name] ([id number]) [id number] Page 1 of 2 Visit Report for 02/09/2023 Arrival Information Details Patient Name: Date of Service: [name] [name] 02/09/2023 11:30 A M Medical Record Number: [id number] Patient Account Number: [id number] Date of Birth/Sex: Treating RN: November 21, 1933 (89 y.o. [name] Primary Care Juergen Hardenbrook: [name] Other Clinician: Daryll Brod Referring Drystan Reader: Treating Ilisa Hayworth/Extender: [name] in Treatment: 31 Visit Information History Since Last Visit All ordered tests and consults were completed: Yes Patient Arrived: Ambulatory Added or deleted any medications: No Arrival Time: 11:33 Any new allergies or adverse reactions: No Accompanied By: self Had a fall or experienced change in No Transfer Assistance: None activities of daily living that may affect Patient Identification Verified: Yes risk of falls: Secondary Verification Process Completed: Yes Signs or symptoms of abuse/neglect since last visito No Patient Requires Transmission-Based Precautions: No Hospitalized since last visit: No Patient Has Alerts: Yes Implantable device outside of the clinic excluding No Patient Alerts: Patient on Blood Thinner cellular tissue based products placed in the center since last visit: Pain Present Now: No Electronic Signature(s) Signed: 02/09/2023 3:25:11 PM By: Haywood Pao CHT EMT BS , , Entered By: Haywood Pao on 02/09/2023 12:25:11 -------------------------------------------------------------------------------- Encounter Discharge Information Details Patient Name: Date of Service: [name] [name] 02/09/2023 11:30 A M Medical Record Number: [id number] Patient Account Number: [id number] Date of Birth/Sex: Treating RN: 12-20-1933 (89 y.o. [name] Primary Care Adalyne Lovick: [name] Other Clinician: Haywood Pao Referring  Lashawnta Burgert: Treating [name]/Extender: [name] in Treatment: 31 Encounter Discharge Information Items Discharge Condition: Stable Ambulatory Status: Ambulatory Discharge Destination: Home Transportation: Private Auto Accompanied By: self Schedule Follow-up Appointment: No Clinical Summary of Care: Electronic Signature(s) Signed: 02/09/2023 3:36:35 PM By: Haywood Pao CHT EMT BS , , Entered By: Haywood Pao on 02/09/2023 12:36:35 Mccuiston, Derrious ([id number]) [id number] Page 2 of 2 -------------------------------------------------------------------------------- Vitals Details Patient Name: Date of Service: [name] [name] 02/09/2023 11:30 A M Medical Record Number: [id number] Patient Account Number: [id number] Date of Birth/Sex: Treating RN: Jun 08, 1933 (89 y.o. [name] Primary Care Shara Hartis: [name] Other Clinician: Daryll Brod Referring Larry Knipp: Treating Azya Barbero/Extender: [name] in Treatment: 31 Vital Signs Time Taken: 11:33 Temperature (F): 97.3 Height (in): 71 Pulse (bpm): 108 Weight (lbs): 170 Respiratory Rate (breaths/min): 18 Body Mass Index (BMI): 23.7 Blood Pressure (mmHg): 138/90 Capillary Blood Glucose (mg/dl): 244 Reference Range: 80 - 120 mg / dl Electronic Signature(s) Signed: 02/09/2023 3:25:49 PM By: Haywood Pao CHT EMT BS , , Entered By: Haywood Pao on 02/09/2023 12:25:49

## 2023-02-10 NOTE — Progress Notes (Addendum)
LINS, Agustine ([id number]) [id number] Page 1 of 3 Visit Report for 02/09/2023 HBO Details Patient Name: Date of Service: [name] [name] 02/09/2023 11:30 A M Medical Record Number: [id number] Patient Account Number: [id number] Date of Birth/Sex: Treating RN: 17-Aug-1933 (89 y.o. [name] Primary Care Rajan Burgard: [name] Other Clinician: Haywood Pao Referring Bryn Perkin: Treating Teosha Casso/Extender: [name] in Treatment: 31 HBO Treatment Course Details Treatment Course Number: 1 Ordering Buffie Herne: [name] T Treatments Ordered: otal 40 HBO Treatment Start Date: 01/09/2023 HBO Indication: Diabetic Ulcer(s) of the Lower Extremity Standard/Conservative Wound Care tried and failed greater than or equal to 30 days Wound #3 Right Amputation Site - T oe HBO Treatment Details Treatment Number: 21 Patient Type: Outpatient Chamber Type: Monoplace Chamber Serial #: [id number] Treatment Protocol: 2.0 ATA with 90 minutes oxygen, and no air breaks Treatment Details Compression Rate Down: 1.0 psi / minute De-Compression Rate Up: 1.0 psi / minute Air breaks and breathing Decompress Decompress Compress Tx Pressure Begins Reached periods Begins Ends (leave unused spaces blank) Chamber Pressure (ATA 1 2 ------2 1 ) Clock Time (24 hr) 12:14 12:29 - - - - - - 14:00 14:10 Treatment Length: 116 (minutes) Treatment Segments: 4 Vital Signs Capillary Blood Glucose Reference Range: 80 - 120 mg / dl HBO Diabetic Blood Glucose Intervention Range: <131 mg/dl or >366 mg/dl Type: Time Vitals Blood Pulse: Respiratory Temperature: Capillary Blood Glucose Pulse Action Taken: Pressure: Rate: Glucose (mg/dl): Meter #: Oximetry (%) Taken: Pre 11:33 138/90 108 18 97.3 171 asymptomatic for tachycardia Post 14:13 135/80 75 18 98.2 120 none per protocol Treatment Response Treatment Toleration: Well Treatment  Completion Status: Treatment Completed without Adverse Event Treatment Notes Mr. Martie arrived with normal vital signs except for heart rate of 108 bpm. He states that between pain and anxiety about getting in the hyperbaric chamber--his heart rate increases. He prepared for treatment. After performing a safety check, patient was placed in the chamber which was compressed with 100% oxygen at a rate of 1.5 psi/min after confirming normal ear equalization. Upon reaching approximately 10 psig, visible cues from patient indicated something was not working correctly. Patient complained of ear equalization issues. Pressure was reduced back to approximately 7 psig. Patient stated that his ears were clear at 7 psig as opposed to a clogged up sensation at 10 psig. Travel rate was reduced to 1 psi/min until reaching treatment depth. Mr. Hocevar tolerated the treatment and subsequent decompression at a rate of 1 psi/min. Patient denied any further issues with ear equalization and/or pain associated with barotrauma. His post-treatment vital signs were normal. Patient's dressing was showing blood/body fluids. This technician removed soiled bandage. I secured the loose steri-strip that was medially situated with medipore tape. I then applied 1/2 ABD pad over the wound, and secured with tape and kerlix, covered with band-net. He was stable upon discharge. Additional Procedure Documentation Tissue Sevierity: Necrosis of bone Dayten Juba Notes LUISEDUARDO, COLATO ([id number]) [id number] Page 2 of 3 No concerns with treatment given Physician HBO Attestation: I certify that I supervised this HBO treatment in accordance with Medicare guidelines. A trained emergency response team is readily available per Yes hospital policies and procedures. Continue HBOT as ordered. Yes Electronic Signature(s) Signed: 02/09/2023 5:41:20 PM By: [name] MD Previous Signature: 02/09/2023 3:40:31 PM  Version By: Haywood Pao CHT EMT BS , , Previous Signature: 02/09/2023 3:33:32 PM Version By: Haywood Pao CHT EMT BS , , Entered By: [name] on 02/09/2023 17:37:49 -------------------------------------------------------------------------------- HBO Safety  Checklist Details Patient Name: Date of Service: [name] [name] 02/09/2023 11:30 A M Medical Record Number: [id number] Patient Account Number: [id number] Date of Birth/Sex: Treating RN: [birth year] (89 y.o. [name] Primary Care Andrw [name]: [name] Other Clinician: Haywood Pao Referring [name]: Treating [name]/Extender: [name] in Treatment: 31 HBO Safety Checklist Items Safety Checklist Consent Form Signed Patient voided / foley secured and emptied When did you last eato 0930 - Bacon [employer] Pancake,T T Coffee ater ots, Last dose of injectable or oral agent 0800 Jardiance Ostomy pouch emptied and vented if applicable NA All implantable devices assessed, documented and approved NA Intravenous access site secured and place NA Valuables secured Linens and cotton and cotton/polyester blend (less than 51% polyester) Personal oil-based products / skin lotions / body lotions removed Wigs or hairpieces removed NA Smoking or tobacco materials removed NA Books / newspapers / magazines / loose paper removed Cologne, aftershave, perfume and deodorant removed Jewelry removed (may wrap wedding band) Make-up removed Hair care products removed Battery operated devices (external) removed Heating patches and chemical warmers removed Titanium eyewear removed Nail polish cured greater than 10 hours NA Casting material cured greater than 10 hours NA Hearing aids removed removed Loose dentures or partials removed dentures removed Prosthetics have been removed NA Patient demonstrates correct use of air break device (if applicable) Patient concerns have been  addressed Patient grounding bracelet on and cord attached to chamber Specifics for Inpatients (complete in addition to above) Medication sheet sent with patient NA Intravenous medications needed or due during therapy sent with patient NA Drainage tubes (e.g. nasogastric tube or chest tube secured and vented) NA Endotracheal or Tracheotomy tube secured NA Cuff deflated of air and inflated with saline NA Airway suctioned NA Guizar, Ralph ([id number]) [id number] Page 3 of 3 Notes Paper version used prior to treatment start. Electronic Signature(s) Signed: 02/09/2023 3:27:33 PM By: Haywood Pao CHT EMT BS , , Entered By: Haywood Pao on 02/09/2023 15:27:32

## 2023-02-12 NOTE — Progress Notes (Signed)
[name], [name] ([id number]) [id number] Page 1 of 1 Visit Report for 02/07/2023 SuperBill Details Patient Name: Date of Service: [name] [name] 02/07/2023 Medical Record Number: [id number] Patient Account Number: [id number] Date of Birth/Sex: Treating RN: 1933-11-30 (89 y.o. 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 E11.621 Type 2 diabetes mellitus with foot ulcer I70.235 Atherosclerosis of native arteries of right leg with ulceration of other part of foot Facility Procedures CPT4 Code Description Modifier Quantity [id number] G0277-(Facility Use Only) HBOT full body chamber, , 4 ICD-10 Diagnosis Description E11.621 Type 2 diabetes mellitus with foot ulcer 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.512 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.235 Atherosclerosis of native arteries of right leg with ulceration of other part of foot Physician Procedures Quantity CPT4 Code Description Modifier [id number] 99183 - WC PHYS HYPERBARIC OXYGEN THERAPY 1 ICD-10 Diagnosis Description E11.621 Type 2 diabetes mellitus with foot ulcer 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.512 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.235 Atherosclerosis of native arteries of right leg with ulceration of other part of foot Electronic Signature(s) Signed: 02/08/2023 3:48:48 PM By: Haywood Pao CHT EMT BS , , Signed: 02/12/2023  9:47:40 AM By: [name] MD Entered By: Haywood Pao on 02/08/2023 15:48:48

## 2023-02-13 ENCOUNTER — Encounter (HOSPITAL_BASED_OUTPATIENT_CLINIC_OR_DEPARTMENT_OTHER): Payer: PPO | Admitting: Internal Medicine

## 2023-02-13 DIAGNOSIS — E11621 Type 2 diabetes mellitus with foot ulcer: Secondary | ICD-10-CM | POA: Diagnosis not present

## 2023-02-13 NOTE — Progress Notes (Signed)
[name], [name] ([id number]) [id number] Page 1 of 2 Visit Report for 02/13/2023 Arrival Information Details Patient Name: Date of Service: [name] 02/13/2023 11:30 A M Medical Record Number: [id number] Patient Account Number: [id number] Date of Birth/Sex: Treating RN: January 14, 1934 (89 y.o. [name] Primary Care [name]: [name] Other Clinician: [name] Referring [name]: Treating [name]/Extender: [name] in Treatment: 32 Visit Information History Since Last Visit All ordered tests and consults were completed: Yes Patient Arrived: Ambulatory Added or deleted any medications: No Arrival Time: 11:34 Any new allergies or adverse reactions: No Accompanied By: self Had a fall or experienced change in No Transfer Assistance: None activities of daily living that may affect Patient Identification Verified: Yes risk of falls: Secondary Verification Process Completed: Yes Signs or symptoms of abuse/neglect since last visito No Patient Requires Transmission-Based Precautions: No Hospitalized since last visit: No Patient Has Alerts: Yes Implantable device outside of the clinic excluding No Patient Alerts: Patient on Blood Thinner cellular tissue based products placed in the center since last visit: Pain Present Now: No Electronic Signature(s) Signed: 02/13/2023 3:59:16 PM By: [name] CHT EMT BS , , Entered By: [name] on 02/13/2023 12:59:16 -------------------------------------------------------------------------------- Encounter Discharge Information Details Patient Name: Date of Service: [name] 02/13/2023 11:30 A M Medical Record Number: [id number] Patient Account Number: [id number] Date of Birth/Sex: Treating RN: 1933/11/19 (89 y.o. [name] Primary Care Theo Krumholz: [name] Other Clinician: [name] Referring  Demoni Gergen: Treating [name]/Extender: [name] in Treatment: 32 Encounter Discharge Information Items Discharge Condition: Stable Ambulatory Status: Ambulatory Discharge Destination: Home Transportation: Private Auto Accompanied By: daughter Schedule Follow-up Appointment: No Clinical Summary of Care: Electronic Signature(s) Signed: 02/13/2023 4:04:32 PM By: [name] CHT EMT BS , , Entered By: [name] on 02/13/2023 13:04:32 [name], [name] ([id number]) [id number] Page 2 of 2 -------------------------------------------------------------------------------- Vitals Details Patient Name: Date of Service: [name] 02/13/2023 11:30 A M Medical Record Number: [id number] Patient Account Number: [id number] Date of Birth/Sex: Treating RN: [birth year] (89 y.o. [name] Primary Care [name]: [name] Other Clinician: [name] Referring [name]: Treating [name]/Extender: [name] in Treatment: 32 Vital Signs Time Taken: 12:11 Temperature (F): 98.4 Height (in): 71 Pulse (bpm): 93 Weight (lbs): 170 Respiratory Rate (breaths/min): 18 Body Mass Index (BMI): 23.7 Blood Pressure (mmHg): 136/94 Capillary Blood Glucose (mg/dl): 433 Reference Range: 80 - 120 mg / dl Electronic Signature(s) Signed: 02/13/2023 3:59:40 PM By: [name] CHT EMT BS , , Entered By: [name] on 02/13/2023 12:59:40

## 2023-02-13 NOTE — Progress Notes (Signed)
ESPINOSA, Dreshawn ([id number]) [id number] Page 1 of 1 Visit Report for 02/13/2023 SuperBill Details Patient Name: Date of Service: [name] [name] 02/13/2023 Medical Record Number: [id number] Patient Account Number: [id number] Date of Birth/Sex: Treating RN: 01/06/1934 (89 y.o. 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 E11.621 Type 2 diabetes mellitus with foot ulcer I70.235 Atherosclerosis of native arteries of right leg with ulceration of other part of foot Facility Procedures CPT4 Code Description Modifier Quantity [id number] G0277-(Facility Use Only) HBOT full body chamber, , 4 ICD-10 Diagnosis Description E11.621 Type 2 diabetes mellitus with foot ulcer 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.512 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.235 Atherosclerosis of native arteries of right leg with ulceration of other part of foot Physician Procedures Quantity CPT4 Code Description Modifier [id number] 99183 - WC PHYS HYPERBARIC OXYGEN THERAPY 1 ICD-10 Diagnosis Description E11.621 Type 2 diabetes mellitus with foot ulcer 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.512 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.235 Atherosclerosis of native arteries of right leg with ulceration of other part of foot Electronic Signature(s) Signed: 02/13/2023 4:03:58 PM By: Haywood Pao CHT EMT BS , , Signed: 02/13/2023  4:43:54 PM By: [name] MD Entered By: Haywood Pao on 02/13/2023 16:03:58

## 2023-02-13 NOTE — Progress Notes (Addendum)
MARCO, Aadon ([id number]) [id number] Page 1 of 3 Visit Report for 02/13/2023 HBO Details Patient Name: Date of Service: [name] 02/13/2023 11:30 A M Medical Record Number: [id number] Patient Account Number: [id number] Date of Birth/Sex: Treating RN: 02-11-1934 (89 y.o. [name] Primary Care [name]: [name] Other Clinician: [name] Referring [name]: Treating [name]/Extender: [name] in Treatment: 32 HBO Treatment Course Details Treatment Course Number: 1 Ordering [name]: [name] T Treatments Ordered: otal 40 HBO Treatment Start Date: 01/09/2023 HBO Indication: Diabetic Ulcer(s) of the Lower Extremity Standard/Conservative Wound Care tried and failed greater than or equal to 30 days Wound #3 Right Amputation Site - T oe HBO Treatment Details Treatment Number: 22 Patient Type: Outpatient Chamber Type: Monoplace Chamber Serial #: [id number] Treatment Protocol: 2.0 ATA with 90 minutes oxygen, and no air breaks Treatment Details Compression Rate Down: 1.5 psi / minute De-Compression Rate Up: 2.0 psi / minute Air breaks and breathing Decompress Decompress Compress Tx Pressure Begins Reached periods Begins Ends (leave unused spaces blank) Chamber Pressure (ATA 1 2 ------2 1 ) Clock Time (24 hr) 12:16 12:28 - - - - - - 13:58 14:09 Treatment Length: 113 (minutes) Treatment Segments: 4 Vital Signs Capillary Blood Glucose Reference Range: 80 - 120 mg / dl HBO Diabetic Blood Glucose Intervention Range: <131 mg/dl or >528 mg/dl Type: Time Vitals Blood Respiratory Capillary Blood Glucose Pulse Action Pulse: Temperature: Taken: Pressure: Rate: Glucose (mg/dl): Meter #: Oximetry (%) Taken: Pre 12:11 136/94 93 18 98.4 202 none per protocol Post 14:13 152/94 83 18 98.5 144 none per protocol Treatment Response Treatment Toleration: Well Treatment Completion Status:  Treatment Completed without Adverse Event Treatment Notes [name] arrived with normal vital signs. He prepared for treatment. After performing a safety check, patient was placed in the chamber which was compressed with 100% oxygen at a rate of 2 psi/min after confirming normal ear equalization. [name] tolerated the treatment and subsequent decompression at a rate of 2 psi/min. Patient denied any further issues with ear equalization and/or pain associated with barotrauma. His post-treatment vital signs were normal. He was stable upon discharge. Additional Procedure Documentation Tissue Sevierity: Necrosis of bone [name] Notes No concerns with treatment given Physician HBO Attestation: I certify that I supervised this HBO treatment in accordance with Medicare guidelines. A trained emergency response team is readily available per Yes [name], [name] [id number] Page 2 of 3 hospital policies and procedures. Continue HBOT as ordered. Yes Electronic Signature(s) Signed: 02/13/2023 4:43:54 PM By: [name] Najjar MD Previous Signature: 02/13/2023 4:03:36 PM Version By: [name] CHT EMT BS , , Entered By: [name] on 02/13/2023 16:42:29 -------------------------------------------------------------------------------- HBO Safety Checklist Details Patient Name: Date of Service: [name] 02/13/2023 11:30 A M Medical Record Number: [id number] Patient Account Number: [id number] Date of Birth/Sex: Treating RN: Feb 11, 1934 (89 y.o. [name], [name] Primary Care [name]: [name] Other Clinician: [name] Referring [name]: Treating [name] [place]/Extender: [name] in Treatment: 32 HBO Safety Checklist Items Safety Checklist Consent Form Signed Patient voided / foley secured and emptied When did you last eato 0900 - [name] Last dose of injectable or oral agent  0800 Ostomy pouch emptied and vented if applicable NA All implantable devices assessed, documented and approved NA Intravenous access site secured and place NA Valuables secured Linens and cotton and cotton/polyester blend (less than 51% polyester) Personal oil-based products / skin lotions / body lotions removed Wigs or hairpieces  removed NA Smoking or tobacco materials removed NA Books / newspapers / magazines / loose paper removed Cologne, aftershave, perfume and deodorant removed Jewelry removed (may wrap wedding band) Make-up removed NA Hair care products removed Battery operated devices (external) removed Heating patches and chemical warmers removed Titanium eyewear removed Nail polish cured greater than 10 hours NA Casting material cured greater than 10 hours NA Hearing aids removed removed Loose dentures or partials removed dentures removed Prosthetics have been removed NA Patient demonstrates correct use of air break device (if applicable) Patient concerns have been addressed Patient grounding bracelet on and cord attached to chamber Specifics for Inpatients (complete in addition to above) Medication sheet sent with patient NA Intravenous medications needed or due during therapy sent with patient NA Drainage tubes (e.g. nasogastric tube or chest tube secured and vented) NA Endotracheal or Tracheotomy tube secured NA Cuff deflated of air and inflated with saline NA Airway suctioned NA Notes Paper version used prior to treatment start. Electronic Signature(s) Signed: 02/13/2023 4:01:08 PM By: [name] CHT EMT BS , , [name], [name] [id number] Page 3 of 3 Entered By: [name] on 02/13/2023 16:01:08

## 2023-02-14 ENCOUNTER — Encounter (HOSPITAL_BASED_OUTPATIENT_CLINIC_OR_DEPARTMENT_OTHER): Payer: PPO | Admitting: Internal Medicine

## 2023-02-14 DIAGNOSIS — E11621 Type 2 diabetes mellitus with foot ulcer: Secondary | ICD-10-CM | POA: Diagnosis not present

## 2023-02-14 LAB — GLUCOSE, CAPILLARY
Glucose-Capillary: 202 mg/dL — ABNORMAL HIGH (ref 70–99)
Glucose-Capillary: 144 mg/dL — ABNORMAL HIGH (ref 70–99)
Glucose-Capillary: 217 mg/dL — ABNORMAL HIGH (ref 70–99)

## 2023-02-14 NOTE — Progress Notes (Addendum)
LASKEY, Lamere ([id number]) [id number] Page 1 of 2 Visit Report for 02/14/2023 Arrival Information Details Patient Name: Date of Service: [name] 02/14/2023 11:30 A M Medical Record Number: [id number] Patient Account Number: [id number] Date of Birth/Sex: Treating RN: 01-Jan-1934 (89 y.o. [name] Primary Care Shemiah Rosch: [name] Other Clinician: Haywood Pao Referring Jaretzi Droz: Treating Leith Hedlund/Extender: [name] in Treatment: 32 Visit Information History Since Last Visit All ordered tests and consults were completed: Yes Patient Arrived: Ambulatory Added or deleted any medications: No Arrival Time: 11:30 Any new allergies or adverse reactions: No Accompanied By: self Had a fall or experienced change in No Transfer Assistance: None activities of daily living that may affect Patient Identification Verified: Yes risk of falls: Secondary Verification Process Completed: Yes Signs or symptoms of abuse/neglect since last visito No Patient Requires Transmission-Based Precautions: No Hospitalized since last visit: No Patient Has Alerts: Yes Implantable device outside of the clinic excluding No Patient Alerts: Patient on Blood Thinner cellular tissue based products placed in the center since last visit: Pain Present Now: No Electronic Signature(s) Signed: 02/14/2023 2:05:53 PM By: Haywood Pao CHT EMT BS , , Entered By: Haywood Pao on 02/14/2023 14:05:53 -------------------------------------------------------------------------------- Encounter Discharge Information Details Patient Name: Date of Service: [name] 02/14/2023 11:30 A M Medical Record Number: [id number] Patient Account Number: [id number] Date of Birth/Sex: Treating RN: 02-10-1934 (89 y.o. [name] Primary Care Aiven Kampe: [name] Other Clinician: Haywood Pao Referring  Harjot Zavadil: Treating Rashawna Scoles/Extender: [name] in Treatment: 32 Encounter Discharge Information Items Discharge Condition: Stable Ambulatory Status: Ambulatory Discharge Destination: Other (Note Required) Transportation: Other Accompanied By: staff Schedule Follow-up Appointment: No Clinical Summary of Care: Notes Patient has wound care visit after treatment. Electronic Signature(s) Signed: 02/14/2023 2:45:31 PM By: Haywood Pao CHT EMT BS , , Entered By: Haywood Pao on 02/14/2023 14:45:31 Maddy, Mikyle ([id number]) [id number] Page 2 of 2 -------------------------------------------------------------------------------- Vitals Details Patient Name: Date of Service: [name] 02/14/2023 11:30 A M Medical Record Number: [id number] Patient Account Number: [id number] Date of Birth/Sex: Treating RN: 05/23/1933 (89 y.o. [name] Primary Care Dencil Cayson: [name] Other Clinician: Haywood Pao Referring Jennings Corado: Treating Cleone Hulick/Extender: [name] in Treatment: 32 Vital Signs Time Taken: 11:36 Temperature (F): 97.4 Height (in): 71 Pulse (bpm): 96 Weight (lbs): 170 Respiratory Rate (breaths/min): 18 Body Mass Index (BMI): 23.7 Blood Pressure (mmHg): 133/90 Capillary Blood Glucose (mg/dl): 951 Reference Range: 80 - 120 mg / dl Electronic Signature(s) Signed: 02/14/2023 2:06:34 PM By: Haywood Pao CHT EMT BS , , Entered By: Haywood Pao on 02/14/2023 14:06:33

## 2023-02-14 NOTE — Progress Notes (Signed)
[name], [name] ([id number]) [id number] Page 1 of 9 Visit Report for 02/14/2023 Arrival Information Details Patient Name: Date of Service: [name] 02/14/2023 3:00 PM Medical Record Number: [id number] Patient Account Number: [id number] Date of Birth/Sex: Treating RN: May 22, 1933 (89 y.o. M) Primary Care Kataleah Bejar: [name] Other Clinician: Referring Deoni Cosey: Treating Diedre Maclellan/Extender: [name] in Treatment: 32 Visit Information History Since Last Visit Added or deleted any medications: No Patient Arrived: Ambulatory Any new allergies or adverse reactions: No Arrival Time: 15:02 Had a fall or experienced change in No Accompanied By: wife activities of daily living that may affect Transfer Assistance: None risk of falls: Patient Identification Verified: Yes Signs or symptoms of abuse/neglect since last visito No Secondary Verification Process Completed: Yes Hospitalized since last visit: No Patient Requires Transmission-Based Precautions: No Implantable device outside of the clinic excluding No Patient Has Alerts: Yes cellular tissue based products placed in the center Patient Alerts: Patient on Blood Thinner since last visit: Pain Present Now: Yes Electronic Signature(s) Unsigned Entered ByDayton Scrape on 02/14/2023 15:02:53 -------------------------------------------------------------------------------- Lower Extremity Assessment Details Patient Name: Date of Service: [name] 02/14/2023 3:00 PM Medical Record Number: [id number] Patient Account Number: [id number] Date of Birth/Sex: Treating RN: Jul 06, 1933 (89 y.o. M) Primary Care Maliq Pilley: [name] Other Clinician: Referring Arlen Dupuis: Treating Idabelle Mcpeters/Extender: [name] in Treatment: 32 Edema Assessment Assessed: [name]: No] [Right: No] Edema: [Left: N] [Right: o] Calf Left: Right: Point of  Measurement: From Medial Instep 33 cm Ankle Left: Right: Point of Measurement: From Medial Instep 22 cm Vascular Assessment Extremity colors, hair growth, and conditions: Extremity Color: [Right:Normal] Hair Growth on Extremity: [Right:No] [name], [name] ([id number]) [[id number] Page 2 of 9] Temperature of Extremity: [Right:Warm] Capillary Refill: [Right:< 3 seconds] Dependent Rubor: [Right:No No] Electronic Signature(s) Unsigned Entered By: Thayer Dallas on 02/14/2023 15:10:08 -------------------------------------------------------------------------------- Multi Wound Chart Details Patient Name: Date of Service: [name] 02/14/2023 3:00 PM Medical Record Number: [id number] Patient Account Number: [id number] Date of Birth/Sex: Treating RN: 1933/05/20 (89 y.o. M) Primary Care Peng Thorstenson: [name] Other Clinician: Referring Eward Rutigliano: Treating Anyelo Mccue/Extender: [name] in Treatment: 32 Vital Signs Height(in): 71 Capillary Blood Glucose(mg/dl): 734 Weight(lbs): 193 Pulse(bpm): 82 Body Mass Index(BMI): 23.7 Blood Pressure(mmHg): 145/92 Temperature(F): 97.3 Respiratory Rate(breaths/min): 18 [3:Photos: No Photos] [N/A:N/A] Right Amputation Site - Toe Right, Lateral Foot N/A Wound Location: Gradually Appeared Shear/Friction N/A Wounding Event: Diabetic Wound/Ulcer of the Lower Diabetic Wound/Ulcer of the Lower N/A Primary Etiology: Extremity Extremity N/A Pressure Ulcer N/A Secondary Etiology: Cataracts, Anemia, Asthma, Chronic Cataracts, Anemia, Asthma, Chronic N/A Comorbid History: Obstructive Pulmonary Disease Obstructive Pulmonary Disease (COPD), Peripheral Arterial Disease, (COPD), Peripheral Arterial Disease, Type II Diabetes, Gout, Osteoarthritis, Type II Diabetes, Gout, Osteoarthritis, Neuropathy Neuropathy 04/07/2022 11/21/2022 N/A Date Acquired: 32 10 N/A Weeks of  Treatment: Open Open N/A Wound Status: No No N/A Wound Recurrence: 0.1x0.1x0.1 1.5x1.3x0.4 N/A Measurements L x W x D (cm) 0.008 1.532 N/A A (cm) : rea 0.001 0.613 N/A Volume (cm) : 99.70% 70.40% N/A % Reduction in A rea: 99.90% -18.30% N/A % Reduction in Volume: 6 Starting Position 1 (o'clock): 10 Ending Position 1 (o'clock): 0.5 Maximum Distance 1 (cm): No Yes N/A Undermining: Grade 3 Grade 2 N/A Classification: Small Medium N/A Exudate A mount: Serosanguineous Serosanguineous N/A Exudate Type: red, brown red, brown N/A Exudate Color: Epibole Distinct, outline attached N/A Wound Margin: None Present (0%) Large (67-100%) N/A Granulation A mount: N/A Red, Pink N/A  Granulation Quality: Large (67-100%) Small (1-33%) N/A Necrotic A mount: Eschar Adherent Slough N/A Necrotic Tissue: [name], [name] ([id number]) [id number] Page 3 of 9 Fat Layer (Subcutaneous Tissue): Yes Fat Layer (Subcutaneous Tissue): Yes N/A Exposed Structures: Fascia: No Tendon: Yes Tendon: No Bone: Yes Muscle: No Fascia: No Joint: No Muscle: No Bone: No Joint: No Large (67-100%) Small (1-33%) N/A Epithelialization: Callus: Yes Callus: Yes N/A Periwound Skin Texture: Excoriation: No Excoriation: No Induration: No Induration: No Crepitus: No Crepitus: No Rash: No Rash: No Scarring: No Scarring: No Maceration: No Maceration: Yes N/A Periwound Skin Moisture: Dry/Scaly: No Dry/Scaly: No Atrophie Blanche: No Atrophie Blanche: No N/A Periwound Skin Color: Cyanosis: No Cyanosis: No Ecchymosis: No Ecchymosis: No Erythema: No Erythema: No Hemosiderin Staining: No Hemosiderin Staining: No Mottled: No Mottled: No Pallor: No Pallor: No Rubor: No Rubor: No No Abnormality No Abnormality N/A Temperature: N/A Cellular or Tissue Based Product N/A Procedures Performed: Treatment Notes Wound #3 (Amputation Site - Toe) Wound Laterality:  Right Cleanser Vashe 5.8 (oz) Discharge Instruction: Cleanse the wound with Vashe prior to applying a clean dressing using gauze sponges, not tissue or cotton balls. Peri-Wound Care Topical Skintegrity Hydrogel 4 (oz) Discharge Instruction: Apply hydrogel as directed Primary Dressing Secondary Dressing Woven Gauze Sponges 2x2 in Discharge Instruction: Apply over primary dressing as directed. Secured With Paper Tape, 2x10 (in/yd) Discharge Instruction: Secure dressing with tape as directed. Compression Wrap Compression Stockings Add-Ons Wound #4 (Foot) Wound Laterality: Right, Lateral Cleanser Soap and Water Discharge Instruction: May shower and wash wound with dial antibacterial soap and water prior to dressing change. Wound Cleanser Discharge Instruction: Cleanse the wound with wound cleanser prior to applying a clean dressing using gauze sponges, not tissue or cotton balls. Peri-Wound Care Skin Prep Discharge Instruction: Use skin prep as directed Topical Primary Dressing apligraf #2 Discharge Instruction: applied by Adrinne Sze. cutimed sorbact swab with steristrip Discharge Instruction: leave in place. drawtex Discharge Instruction: leave in place. Secondary Dressing [name], [name] ([id number]) [id number] Page 4 of 9 ABD Pad, 5x9 Discharge Instruction: Apply over primary dressing as directed. Secured With [employer], 4.5x3.1 (in/yd) Discharge Instruction: Secure with Kerlix as directed. 34M Medipore H Soft Cloth Surgical T ape, 4 x 10 (in/yd) Discharge Instruction: Secure with tape as directed. Compression Wrap Compression Stockings Add-Ons Electronic Signature(s) Unsigned Entered By: [name] on 02/14/2023 15:59:03 -------------------------------------------------------------------------------- Multi-Disciplinary Care Plan Details Patient Name: Date of Service: [name] 02/14/2023 3:00 PM Medical  Record Number: [id number] Patient Account Number: [id number] Date of Birth/Sex: Treating RN: 07-17-1933 (89 y.o. [name] Primary Care Mishawn Hemann: [name] Other Clinician: Referring Freman Lapage: Treating Binyamin Nelis/Extender: [name] in Treatment: 32 Active Inactive Wound/Skin Impairment Nursing Diagnoses: Impaired tissue integrity Knowledge deficit related to ulceration/compromised skin integrity Goals: Patient will have a decrease in wound volume by X% from date: (specify in notes) Date Initiated: 07/04/2022 Target Resolution Date: 03/31/2023 Goal Status: Active Patient/caregiver will verbalize understanding of skin care regimen Date Initiated: 07/04/2022 Target Resolution Date: 03/30/2023 Goal Status: Active Ulcer/skin breakdown will have a volume reduction of 30% by week 4 Date Initiated: 07/04/2022 Date Inactivated: 10/27/2022 Target Resolution Date: 11/05/2022 Goal Status: Met Ulcer/skin breakdown will have a volume reduction of 50% by week 8 Date Initiated: 07/04/2022 Date Inactivated: 10/27/2022 Target Resolution Date: 11/05/2022 Goal Status: Met Interventions: Assess patient/caregiver ability to obtain necessary supplies Assess patient/caregiver ability to perform ulcer/skin care regimen upon admission and as needed Assess ulceration(s) every visit Notes: Electronic Signature(s) Unsigned Entered By:  Shawn Stall on 02/14/2023 15:14:53 Signature(s): LIO, GUESS ([id number]) [id number] Date(s): df Page 5 of 9 -------------------------------------------------------------------------------- Pain Assessment Details Patient Name: Date of Service: [name] 02/14/2023 3:00 PM Medical Record Number: [id number] Patient Account Number: [id number] Date of Birth/Sex: Treating RN: [birth year] (89 y.o. M) Primary Care Meilin Brosh: [name] Other Clinician: Referring Aryn Safran: Treating  Kasen Sako/Extender: [name] in Treatment: 32 Active Problems Location of Pain Severity and Description of Pain Patient Has Paino Yes Site Locations Rate the pain. Current Pain Level: 2 Pain Management and Medication Current Pain Management: Electronic Signature(s) Unsigned Entered ByDayton Scrape on 02/14/2023 15:03:07 -------------------------------------------------------------------------------- Patient/Caregiver Education Details Patient Name: Date of Service: [name] 12/10/2024andnbsp3:00 PM Medical Record Number: [id number] Patient Account Number: [id number] Date of Birth/Gender: Treating RN: Sep 27, 1933 (89 y.o. [name] Primary Care Physician: [name] Other Clinician: Referring Physician: Treating Physician/Extender: [name] in Treatment: 32 Education Assessment Education Provided To: Patient Education Topics Provided St. [name], [name] ([id number]) [id number] Page 6 of 9 Wound/Skin Impairment: Handouts: Caring for Your Ulcer Methods: Explain/Verbal Responses: Reinforcements needed Electronic Signature(s) Unsigned Entered By: Shawn Stall on 02/14/2023 15:15:37 -------------------------------------------------------------------------------- Wound Assessment Details Patient Name: Date of Service: [name] 02/14/2023 3:00 PM Medical Record Number: [id number] Patient Account Number: [id number] Date of Birth/Sex: Treating RN: 10/06/1933 (89 y.o. M) Primary Care Kamber Vignola: [name] Other Clinician: Referring Britni Driscoll: Treating Markevion Lattin/Extender: [name] in Treatment: 32 Wound Status Wound Number: 3 Primary Diabetic Wound/Ulcer of the Lower Extremity Etiology: Wound Location: Right Amputation Site - Toe Wound Open Wounding Event: Gradually Appeared Status: Date Acquired: 04/07/2022 Comorbid  Cataracts, Anemia, Asthma, Chronic Obstructive Pulmonary Weeks Of Treatment: 32 History: Disease (COPD), Peripheral Arterial Disease, Type II Diabetes, Clustered Wound: No Gout, Osteoarthritis, Neuropathy Wound Measurements Length: (cm) 0.1 Width: (cm) 0.1 Depth: (cm) 0.1 Area: (cm) 0.008 Volume: (cm) 0.001 % Reduction in Area: 99.7% % Reduction in Volume: 99.9% Epithelialization: Large (67-100%) Tunneling: No Undermining: No Wound Description Classification: Grade 3 Wound Margin: Epibole Exudate Amount: Small Exudate Type: Serosanguineous Exudate Color: red, brown Foul Odor After Cleansing: No Slough/Fibrino No Wound Bed Granulation Amount: None Present (0%) Exposed Structure Necrotic Amount: Large (67-100%) Fascia Exposed: No Necrotic Quality: Eschar Fat Layer (Subcutaneous Tissue) Exposed: Yes Tendon Exposed: No Muscle Exposed: No Joint Exposed: No Bone Exposed: No Periwound Skin Texture Texture Color No Abnormalities Noted: No No Abnormalities Noted: No Callus: Yes Atrophie Blanche: No Crepitus: No Cyanosis: No Excoriation: No Ecchymosis: No Induration: No Erythema: No Rash: No Hemosiderin Staining: No Scarring: No Mottled: No Pallor: No Moisture Rubor: No No Abnormalities Noted: No Dry / Scaly: No Temperature / Pain Maceration: No Temperature: No Abnormality [name], [name] ([id number]) [id number] Page 7 of 9 Treatment Notes Wound #3 (Amputation Site - Toe) Wound Laterality: Right Cleanser Vashe 5.8 (oz) Discharge Instruction: Cleanse the wound with Vashe prior to applying a clean dressing using gauze sponges, not tissue or cotton balls. Peri-Wound Care Topical Skintegrity Hydrogel 4 (oz) Discharge Instruction: Apply hydrogel as directed Primary Dressing Secondary Dressing Woven Gauze Sponges 2x2 in Discharge Instruction: Apply over primary dressing as directed. Secured With Paper Tape, 2x10 (in/yd) Discharge  Instruction: Secure dressing with tape as directed. Compression Wrap Compression Stockings Add-Ons Electronic Signature(s) Unsigned Entered By: Shawn Stall on 02/14/2023 15:33:45 -------------------------------------------------------------------------------- Wound Assessment Details Patient Name: Date of Service: [name] 02/14/2023 3:00 PM Medical Record Number: [id number] Patient Account Number: [id number] Date of  Birth/Sex: Treating RN: 1933-09-10 (89 y.o. M) Primary Care Mieshia Pepitone: [name] Other Clinician: Referring Danasha Melman: Treating Storm Dulski/Extender: [name] in Treatment: 32 Wound Status Wound Number: 4 Primary Diabetic Wound/Ulcer of the Lower Extremity Etiology: Wound Location: Right, Lateral Foot Secondary Pressure Ulcer Wounding Event: Shear/Friction Etiology: Date Acquired: 11/21/2022 Wound Open Weeks Of Treatment: 10 Status: Clustered Wound: No Comorbid Cataracts, Anemia, Asthma, Chronic Obstructive Pulmonary History: Disease (COPD), Peripheral Arterial Disease, Type II Diabetes, Gout, Osteoarthritis, Neuropathy Photos [name], [name] ([id number]) [id number] Page 8 of 9 Wound Measurements Length: (cm) 1.5 Width: (cm) 1.3 Depth: (cm) 0.4 Area: (cm) 1.532 Volume: (cm) 0.613 % Reduction in Area: 70.4% % Reduction in Volume: -18.3% Epithelialization: Small (1-33%) Tunneling: No Undermining: Yes Starting Position (o'clock): 6 Ending Position (o'clock): 10 Maximum Distance: (cm) 0.5 Wound Description Classification: Grade 2 Wound Margin: Distinct, outline attached Exudate Amount: Medium Exudate Type: Serosanguineous Exudate Color: red, brown Foul Odor After Cleansing: No Slough/Fibrino No Wound Bed Granulation Amount: Large (67-100%) Exposed Structure Granulation Quality: Red, Pink Fascia Exposed: No Necrotic Amount: Small (1-33%) Fat Layer (Subcutaneous Tissue)  Exposed: Yes Necrotic Quality: Adherent Slough Tendon Exposed: Yes Muscle Exposed: No Joint Exposed: No Bone Exposed: Yes Periwound Skin Texture Texture Color No Abnormalities Noted: No No Abnormalities Noted: No Callus: Yes Atrophie Blanche: No Crepitus: No Cyanosis: No Excoriation: No Ecchymosis: No Induration: No Erythema: No Rash: No Hemosiderin Staining: No Scarring: No Mottled: No Pallor: No Moisture Rubor: No No Abnormalities Noted: No Dry / Scaly: No Temperature / Pain Maceration: Yes Temperature: No Abnormality Treatment Notes Wound #4 (Foot) Wound Laterality: Right, Lateral Cleanser Soap and Water Discharge Instruction: May shower and wash wound with dial antibacterial soap and water prior to dressing change. Wound Cleanser Discharge Instruction: Cleanse the wound with wound cleanser prior to applying a clean dressing using gauze sponges, not tissue or cotton balls. Peri-Wound Care Skin Prep Discharge Instruction: Use skin prep as directed Topical Primary Dressing apligraf #2 Discharge Instruction: applied by [name]. [name], [name] ([id number]) [id number] Page 9 of 9 cutimed sorbact swab with steristrip Discharge Instruction: leave in place. drawtex Discharge Instruction: leave in place. Secondary Dressing ABD Pad, 5x9 Discharge Instruction: Apply over primary dressing as directed. Secured With [employer], 4.5x3.1 (in/yd) Discharge Instruction: Secure with Kerlix as directed. 24M Medipore H Soft Cloth Surgical T ape, 4 x 10 (in/yd) Discharge Instruction: Secure with tape as directed. Compression Wrap Compression Stockings Add-Ons Electronic Signature(s) Unsigned Entered By: Thayer Dallas on 02/14/2023 15:11:57 -------------------------------------------------------------------------------- Vitals Details Patient Name: Date of Service: [name] 02/14/2023 3:00 PM Medical Record Number:  [id number] Patient Account Number: [id number] Date of Birth/Sex: Treating RN: [birth year] (89 y.o. M) Primary Care Helga Asbury: [name] Other Clinician: Haywood Pao Referring Stesha Neyens: Treating Adreena Willits/Extender: [name] in Treatment: 32 Vital Signs Time Taken: 14:17 Temperature (F): 97.3 Height (in): 71 Pulse (bpm): 82 Weight (lbs): 170 Respiratory Rate (breaths/min): 18 Body Mass Index (BMI): 23.7 Blood Pressure (mmHg): 145/92 Capillary Blood Glucose (mg/dl): 237 Reference Range: 80 - 120 mg / dl Electronic Signature(s) Signed: 02/14/2023 2:44:16 PM By: Haywood Pao CHT EMT BS , , Entered By: Haywood Pao on 02/14/2023 14:44:16

## 2023-02-14 NOTE — Progress Notes (Signed)
[name], [name] ([id number]) [id number] Page 1 of 2 Visit Report for 02/14/2023 HBO Details Patient Name: Date of Service: [name] [name] 02/14/2023 11:30 A M Medical Record Number: [id number] Patient Account Number: [id number] Date of Birth/Sex: Treating RN: 10/20/1933 (89 y.o. [name], [name].[name] Primary Care Jadira Nierman: [name] Other Clinician: Haywood Pao Referring Abdulla Pooley: Treating Azariyah Luhrs/Extender: [name] in Treatment: 32 HBO Treatment Course Details Treatment Course Number: 1 Ordering Giavanni Zeitlin: [name] T Treatments Ordered: otal 40 HBO Treatment Start Date: 01/09/2023 HBO Indication: Diabetic Ulcer(s) of the Lower Extremity Standard/Conservative Wound Care tried and failed greater than or equal to 30 days Wound #3 Right Amputation Site - T oe HBO Treatment Details Treatment Number: 23 Patient Type: Outpatient Chamber Type: Monoplace Chamber Serial #: [id number] Treatment Protocol: 2.0 ATA with 90 minutes oxygen, and no air breaks Treatment Details Compression Rate Down: 1.0 psi / minute De-Compression Rate Up: 1.0 psi / minute Air breaks and breathing Decompress Decompress Compress Tx Pressure Begins Reached periods Begins Ends (leave unused spaces blank) Chamber Pressure (ATA 1 2 ------2 1 ) Clock Time (24 hr) 12:10 12:25 - - - - - - 13:55 14:11 Treatment Length: 121 (minutes) Treatment Segments: 4 Vital Signs Capillary Blood Glucose Reference Range: 80 - 120 mg / dl HBO Diabetic Blood Glucose Intervention Range: <131 mg/dl or >782 mg/dl Type: Time Vitals Blood Respiratory Capillary Blood Glucose Pulse Action Pulse: Temperature: Taken: Pressure: Rate: Glucose (mg/dl): Meter #: Oximetry (%) Taken: Pre 11:36 133/90 96 18 97.4 217 none per protocol Post 14:17 145/92 18 18 97.3 116 none per protocol Treatment Response Treatment Toleration: Well Treatment Completion Status:  Treatment Completed without Adverse Event Treatment Notes Mr. Pach arrived with normal vital signs. He prepared for treatment. After performing a safety check, patient was placed in the chamber which was compressed with 100% oxygen at a rate of 2 psi/min after confirming normal ear equalization. Upon reaching 5 psig, patient reluctantly expressed ear equalization was not proceeding as normal. Pressurization reversed until patient confirmed ear equalization. Pressurization was slowed to 1 psi/min. This happened again at 10 psig. Pressure reversed until patient confirmed equalization. Travel rate reduced further by increasing ventilation rate to 450 L/min. And also, once more at approximately 14.7 psig. Pressure was reversed and ears equalized. Mr. Hotz tolerated the treatment and subsequent decompression at a rate of 1 psi/min. Patient denied any further issues with ear equalization and/or pain associated with barotrauma. His post-treatment vital signs were normal. He was stable upon discharge. Additional Procedure Documentation Tissue Sevierity: Necrosis of bone Electronic Signature(s) Signed: 02/14/2023 2:42:41 PM By: Haywood Pao CHT EMT BS , , Ostrum, Murrell ([id number]) [id number] Page 2 of 2 Entered By: Haywood Pao on 02/14/2023 14:42:41 -------------------------------------------------------------------------------- HBO Safety Checklist Details Patient Name: Date of Service: [name] [name] 02/14/2023 11:30 A M Medical Record Number: [id number] Patient Account Number: [id number] Date of Birth/Sex: Treating RN: 1933/11/02 (89 y.o. [name] Primary Care Yailen Zemaitis: [name] Other Clinician: Haywood Pao Referring Ceylon Arenson: Treating Saylor Sheckler/Extender: [name] in Treatment: 32 HBO Safety Checklist Items Safety Checklist Consent Form Signed Patient voided / foley secured and  emptied When did you last eato 0930- Bacon [employer] Toast Last dose of injectable or oral agent 0800 Jardiance Ostomy pouch emptied and vented if applicable NA All implantable devices assessed, documented and approved NA Intravenous access site secured and place NA Valuables secured Linens and cotton and cotton/polyester blend (less than 51% polyester) Personal  oil-based products / skin lotions / body lotions removed Wigs or hairpieces removed NA Smoking or tobacco materials removed NA Books / newspapers / magazines / loose paper removed Cologne, aftershave, perfume and deodorant removed Jewelry removed (may wrap wedding band) Make-up removed NA Hair care products removed Battery operated devices (external) removed Heating patches and chemical warmers removed Titanium eyewear removed Nail polish cured greater than 10 hours NA Casting material cured greater than 10 hours NA Hearing aids removed removed Loose dentures or partials removed dentures removed Prosthetics have been removed NA Patient demonstrates correct use of air break device (if applicable) Patient concerns have been addressed Patient grounding bracelet on and cord attached to chamber Specifics for Inpatients (complete in addition to above) Medication sheet sent with patient NA Intravenous medications needed or due during therapy sent with patient NA Drainage tubes (e.g. nasogastric tube or chest tube secured and vented) NA Endotracheal or Tracheotomy tube secured NA Cuff deflated of air and inflated with saline NA Airway suctioned NA Notes Paper version used prior to treatment start. Electronic Signature(s) Signed: 02/14/2023 2:07:58 PM By: Haywood Pao CHT EMT BS , , Entered By: Haywood Pao on 02/14/2023 14:07:58

## 2023-02-15 ENCOUNTER — Encounter (HOSPITAL_BASED_OUTPATIENT_CLINIC_OR_DEPARTMENT_OTHER): Payer: PPO | Admitting: Internal Medicine

## 2023-02-15 DIAGNOSIS — E11621 Type 2 diabetes mellitus with foot ulcer: Secondary | ICD-10-CM | POA: Diagnosis not present

## 2023-02-15 LAB — GLUCOSE, CAPILLARY
Glucose-Capillary: 116 mg/dL — ABNORMAL HIGH (ref 70–99)
Glucose-Capillary: 197 mg/dL — ABNORMAL HIGH (ref 70–99)
Glucose-Capillary: 111 mg/dL — ABNORMAL HIGH (ref 70–99)

## 2023-02-15 NOTE — Progress Notes (Signed)
RIGLER, Ferguson ([id number]) [id number] Page 1 of 1 Visit Report for 02/14/2023 SuperBill Details Patient Name: Date of Service: [name] 02/14/2023 Medical Record Number: [id number] Patient Account Number: [id number] Date of Birth/Sex: Treating RN: [birth year] (89 y.o. [name], [name] Primary Care Provider: [name] Other Clinician: Haywood Pao Referring Provider: Treating Provider/Extender: [name] in Treatment: 32 Diagnosis Coding ICD-10 Codes Code Description [phone] Non-pressure chronic ulcer of other part of right foot with fat layer exposed L97.514 Non-pressure chronic ulcer of other part of right foot with necrosis of bone E11.621 Type 2 diabetes mellitus with foot ulcer I70.235 Atherosclerosis of native arteries of right leg with ulceration of other part of foot Facility Procedures CPT4 Code Description Modifier Quantity [id number]-(Facility Use Only) HBOT full body chamber, , 4 ICD-10 Diagnosis Description E11.621 Type 2 diabetes mellitus with foot ulcer 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.512 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.235 Atherosclerosis of native arteries of right leg with ulceration of other part of foot Physician Procedures Quantity CPT4 Code Description Modifier [id number] - WC PHYS HYPERBARIC OXYGEN THERAPY 1 ICD-10 Diagnosis Description E11.621 Type 2 diabetes mellitus with foot ulcer 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.512 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.235 Atherosclerosis of native arteries of right leg with ulceration of other part of foot Electronic Signature(s) Signed: 02/14/2023 4:13:27 PM By: Haywood Pao CHT EMT BS , , Signed:  02/15/2023 10:03:28 AM By: [name] MD Previous Signature: 02/14/2023 2:44:52 PM Version By: Haywood Pao CHT EMT BS , , Previous Signature: 02/14/2023 2:43:08 PM Version By: Haywood Pao CHT EMT BS , , Entered By: Haywood Pao on 02/14/2023 13:13:27

## 2023-02-15 NOTE — Progress Notes (Signed)
CLEMANS, Kinser ([id number]) [id number] Page 1 of 2 Visit Report for 02/15/2023 Arrival Information Details Patient Name: Date of Service: [name] 02/15/2023 11:30 A M Medical Record Number: [id number] Patient Account Number: [id number] Date of Birth/Sex: Treating RN: June 05, 1933 (89 y.o. [name] Primary Care Lennex Pietila: [name] Other Clinician: Haywood Pao Referring Latangela Mccomas: Treating Gearldene Fiorenza/Extender: [name] in Treatment: 32 Visit Information History Since Last Visit All ordered tests and consults were completed: Yes Patient Arrived: Ambulatory Added or deleted any medications: No Arrival Time: 11:27 Any new allergies or adverse reactions: No Accompanied By: self Had a fall or experienced change in No Transfer Assistance: None activities of daily living that may affect Patient Identification Verified: Yes risk of falls: Secondary Verification Process Completed: Yes Signs or symptoms of abuse/neglect since last visito No Patient Requires Transmission-Based Precautions: No Hospitalized since last visit: No Patient Has Alerts: Yes Implantable device outside of the clinic excluding No Patient Alerts: Patient on Blood Thinner cellular tissue based products placed in the center since last visit: Pain Present Now: No Electronic Signature(s) Signed: 02/15/2023 1:45:01 PM By: Haywood Pao CHT EMT BS , , Entered By: Haywood Pao on 02/15/2023 10:45:01 -------------------------------------------------------------------------------- Encounter Discharge Information Details Patient Name: Date of Service: [name] 02/15/2023 11:30 A M Medical Record Number: [id number] Patient Account Number: [id number] Date of Birth/Sex: Treating RN: Nov 14, 1933 (89 y.o. [name] Primary Care Spencer Cardinal: [name] Other Clinician: Haywood Pao Referring  Jene Oravec: Treating Artemus Romanoff/Extender: [name] in Treatment: 32 Encounter Discharge Information Items Discharge Condition: Stable Ambulatory Status: Ambulatory Discharge Destination: Home Transportation: Private Auto Accompanied By: self Schedule Follow-up Appointment: No Clinical Summary of Care: Electronic Signature(s) Signed: 02/15/2023 4:33:04 PM By: Haywood Pao CHT EMT BS , , Entered By: Haywood Pao on 02/15/2023 13:33:03 Suastegui, Romelle ([id number]) [id number] Page 2 of 2 -------------------------------------------------------------------------------- Vitals Details Patient Name: Date of Service: [name] 02/15/2023 11:30 A M Medical Record Number: [id number] Patient Account Number: [id number] Date of Birth/Sex: Treating RN: [birth year] (89 y.o. [name] Primary Care Sonnet Rizor: [name] Other Clinician: Haywood Pao Referring Kaeden Depaz: Treating Dominga Mcduffie/Extender: [name] in Treatment: 32 Vital Signs Time Taken: 11:47 Temperature (F): 97.5 Height (in): 71 Pulse (bpm): 98 Weight (lbs): 170 Respiratory Rate (breaths/min): 18 Body Mass Index (BMI): 23.7 Blood Pressure (mmHg): 128/59 Capillary Blood Glucose (mg/dl): 433 Reference Range: 80 - 120 mg / dl Electronic Signature(s) Signed: 02/15/2023 1:45:40 PM By: Haywood Pao CHT EMT BS , , Entered By: Haywood Pao on 02/15/2023 10:45:40

## 2023-02-15 NOTE — Progress Notes (Addendum)
WIDRIG, Tavita ([id number]) [id number] Page 1 of 3 Visit Report for 02/15/2023 HBO Details Patient Name: Date of Service: [name] [name] 02/15/2023 11:30 A M Medical Record Number: [id number] Patient Account Number: [id number] Date of Birth/Sex: Treating RN: 1934/03/02 (89 y.o. [name] Primary Care Roshawn Lacina: [name] Other Clinician: Haywood Pao Referring Melenda Bielak: Treating Jelena Malicoat/Extender: [name] in Treatment: 32 HBO Treatment Course Details Treatment Course Number: 1 Ordering [name]: [name] T Treatments Ordered: otal 40 HBO Treatment Start Date: 01/09/2023 HBO Indication: Diabetic Ulcer(s) of the Lower Extremity Standard/Conservative Wound Care tried and failed greater than or equal to 30 days Wound #3 Right Amputation Site - T oe HBO Treatment Details Treatment Number: 24 Patient Type: Outpatient Chamber Type: Monoplace Chamber Serial #: [id number] Treatment Protocol: 2.0 ATA with 90 minutes oxygen, and no air breaks Treatment Details Compression Rate Down: 1.0 psi / minute De-Compression Rate Up: 1.0 psi / minute Air breaks and breathing Decompress Decompress Compress Tx Pressure Begins Reached periods Begins Ends (leave unused spaces blank) Chamber Pressure (ATA 1 2 ------2 1 ) Clock Time (24 hr) 11:50 12:10 - - - - - - 13:40 13:54 Treatment Length: 124 (minutes) Treatment Segments: 4 Vital Signs Capillary Blood Glucose Reference Range: 80 - 120 mg / dl HBO Diabetic Blood Glucose Intervention Range: <131 mg/dl or >696 mg/dl Type: Time Vitals Blood Respiratory Capillary Blood Glucose Pulse Action Pulse: Temperature: Taken: Pressure: Rate: Glucose (mg/dl): Meter #: Oximetry (%) Taken: Pre 11:47 128/59 98 18 97.5 197 none per protocol Post 13:57 132/97 82 18 97.7 111 none per protocol Treatment Response Treatment Toleration: Well Treatment Completion  Status: Treatment Completed without Adverse Event Treatment Notes Mr. Rafique arrived with normal vital signs. He prepared for treatment. After performing a safety check, patient was placed in the chamber which was compressed with 100% oxygen at a rate of 1 psi/min at ventilation rate of 450 L/min, effectively reducing actual travel rate to approximately 0.74 psi/min. Mr. Brandel tolerated the treatment and subsequent decompression at a rate of 1 psi/min. Patient denied any issues with ear equalization and/or pain associated with barotrauma. His post-treatment vital signs were normal. He was stable upon discharge. Additional Procedure Documentation Tissue Sevierity: Necrosis of bone Lakeena Downie Notes No concern with treatment given Physician HBO Attestation: I certify that I supervised this HBO treatment in accordance with Medicare guidelines. A trained emergency response team is readily available per Yes ABDALA, [name] ([id number]) [id number] Page 2 of 3 hospital policies and procedures. Continue HBOT as ordered. Yes Electronic Signature(s) Signed: 02/15/2023 5:18:28 PM By: [name] MD Previous Signature: 02/15/2023 4:32:08 PM Version By: Haywood Pao CHT EMT BS , , Entered By: [name] on 02/15/2023 14:16:24 -------------------------------------------------------------------------------- HBO Safety Checklist Details Patient Name: Date of Service: [name] [name] 02/15/2023 11:30 A M Medical Record Number: [id number] Patient Account Number: [id number] Date of Birth/Sex: Treating RN: [birth year] (89 y.o. [name] Primary Care Elanie Hammitt: [name] Other Clinician: Haywood Pao Referring Marilu Rylander: Treating Kyrollos Cordell/Extender: [name] in Treatment: 32 HBO Safety Checklist Items Safety Checklist Consent Form Signed Patient voided / foley secured and emptied When did you last eato  0900 Last dose of injectable or oral agent 0800 Ostomy pouch emptied and vented if applicable NA All implantable devices assessed, documented and approved NA Intravenous access site secured and place NA Valuables secured Linens and cotton and cotton/polyester blend (less than 51% polyester) Personal oil-based products / skin lotions / body  lotions removed Wigs or hairpieces removed NA Smoking or tobacco materials removed NA Books / newspapers / magazines / loose paper removed Cologne, aftershave, perfume and deodorant removed Jewelry removed (may wrap wedding band) Make-up removed NA Hair care products removed Battery operated devices (external) removed Heating patches and chemical warmers removed Titanium eyewear removed Nail polish cured greater than 10 hours NA Casting material cured greater than 10 hours NA Hearing aids removed removed Loose dentures or partials removed dentures removed Prosthetics have been removed NA Patient demonstrates correct use of air break device (if applicable) Patient concerns have been addressed Patient grounding bracelet on and cord attached to chamber Specifics for Inpatients (complete in addition to above) Medication sheet sent with patient NA Intravenous medications needed or due during therapy sent with patient NA Drainage tubes (e.g. nasogastric tube or chest tube secured and vented) NA Endotracheal or Tracheotomy tube secured NA Cuff deflated of air and inflated with saline NA Airway suctioned NA Notes Paper version used prior to treatment start. Electronic Signature(s) Signed: 02/15/2023 1:50:57 PM By: Haywood Pao CHT EMT BS , , [place], [place]te ([id number]) [id number] Page 3 of 3 Entered By: Haywood Pao on 02/15/2023 10:50:57

## 2023-02-15 NOTE — Progress Notes (Signed)
COLUCCI, Audwin ([id number]) [id number] Page 1 of 10 Visit Report for 02/14/2023 Cellular or Tissue Based Product Details Patient Name: Date of Service: [name] 02/14/2023 3:00 PM Medical Record Number: [id number] Patient Account Number: [id number] Date of Birth/Sex: Treating RN: 03-Apr-1933 (89 y.o. M) Primary Care Provider: [name] Other Clinician: Referring Provider: Treating Provider/Extender: [name] in Treatment: 32 Cellular or Tissue Based Product Type Wound #4 Right,Lateral Foot Applied to: Performed By: Physician Maxwell Caul., MD The following information was scribed by: Shawn Stall The information was scribed for: [name] Cellular or Tissue Based Product Type: Apligraf Level of Consciousness (Pre-procedure): Awake and Alert Pre-procedure Verification/Time Out Yes - 15:30 Taken: Location: genitalia / hands / feet / multiple digits Wound Size (sq cm): 1.95 Product Size (sq cm): 44 Waste Size (sq cm): 0 Amount of Product Applied (sq cm): 44 Instrument Used: Forceps, Scissors Lot #: [id number] Order #: 2 Expiration Date: 02/22/2023 Fenestrated: Yes Instrument: Blade Reconstituted: Yes Solution Amount: 10mL Lot #: [id number] KS Solution Expiration Date: 06/25/2024 Secured: Yes Secured With: Steri-Strips, cutimed sorbact swab Dressing Applied: No Procedural Pain: 0 Post Procedural Pain: 0 Response to Treatment: Procedure was tolerated well Level of Consciousness (Post- Awake and Alert procedure): Post Procedure Diagnosis Same as Pre-procedure Electronic Signature(s) Signed: 02/15/2023 10:03:28 AM By: [name] MD Entered By: [name] on 02/14/2023 12:59:12 -------------------------------------------------------------------------------- HPI Details Patient Name: Date of Service: [name] 02/14/2023 3:00 PM Medical Record Number:  [id number] Patient Account Number: [id number] Date of Birth/Sex: Treating RN: 03-Dec-1933 (89 y.o. M) Primary Care Provider: [name] Other Clinician: Referring Provider: Treating Provider/Extender: [name] in Treatment: [address], [name] ([id number]) [id number] Page 2 of 10 History of Present Illness HPI Description: 07/04/2022 [name] is an 89 year old male with a past medical history of uncontrolled, insulin dependent type 2 diabetes, CKD stage III, and COPD that presents to the clinic for a right foot wound. He has an extensive history of peripheral arterial disease to the right leg. He has had a right superficial femoral to posterior tibial artery bypass by Dr. Elonda Husky in 2020. He had a right common femoral to plantar posterior tibial artery bypass on 02/03/2022 by Dr. Lenell Antu and at that time he also had a right toe amputation. He states this wound site had healed completely. Prior to this he had amputations to the first and second digit to the right foot. He presents today with an open wound to the previous right toe amputation site for the past 2 months. He has been following with vein and vascular for this issue. Most recent ABIs were on 06/22/2022 that showed absent waveforms in the right lower extremity. Currently his right lower extremity bypass is occluded and no plan for another anteriorgram and has he likely has no revascularization options. Currently he has been using Betadine to the wound bed. He denies signs of infection. 07/11/2022 patient arrives in clinic today using Hydrofera Blue on the toe amputation site. The patient is revascularized and is not felt to have any other options for revascularization. We are using Hydrofera Blue and gauze. 5/13; patient presents for follow-up. He has been using Hydrofera Blue to the toe amputation site. He has had no issues. Wound is smaller. He has been approved  for [facility] and was agreeable to have this placed in office today. 5/20; patient presents for follow-up. We have been using Kerecis to the wound bed. There is been improvement in  healing. Unfortunately patient did not change the outer dressing for a week. There is slight maceration to the periwound. Unclear why he did not do this. 5/30; patient presents for follow-up. We have been using Kerecis to the wound bed. More maceration to the periwound today. No signs of infection. 6/10; patient presents for follow-up. He has been using Hydrofera Blue to the wound bed. He has no issues or complaints today. Maceration has almost completely resolved. 6/18; patient presents for follow-up. He has been using blast X and collagen to the wound bed without issues. Wound appears smaller today. 6/28; patient presents for follow-up. He has been using blast X and collagen to the wound bed. The wound is smaller. 7/11; patient presents for follow-up. He has been using blast X and collagen to the wound bed. Wound is smaller. He has slough accumulation. 7/25; patient presents for follow-up. He has been using blast X and collagen to the wound bed. Wound is stable. He denies signs of infection. 8/26; patient presents for follow-up. He has been using blast X and collagen to the wound bed. Wound is slightly smaller. He has no issues or complaints. 8/29; patient presents for follow-up. He has been using blast X and Aquacel Ag to the wound bed. He has been using a Velcro wrap at his toes to keep the toes closer together to help with wound healing. He has no issues or complaints today. Wound is smaller. 9/12; patient presents for follow-up. He has been using blast X and Aquacel Ag to the wound bed. Unfortunately he has developed a pressure injury to the lateral aspect of the foot. He is not sure how this started. No open wound there. He denies pain. 9/23; this patient has a wound on the right foot at the amputation site of her  third toe. It is right up against the fourth toe making it somewhat difficult to dress. When he was here last time he had a blister/DTI on the lateral aspect of the fifth MTP 9/30; patient has a wound on the right foot amputation site of his third toe. Probably in the third fourth webspace. I changed him to endoform last week. The wound looks somewhat better The area over the lateral part of the fifth metatarsal head which was blistered with some central deep tissue looking injury was leaking fluid today. The blister was unroofed. I used pickups and scissors to remove this. I do not know that there is anything technically open here although there is certainly some central tissue at risk 10/7; his original wound at the amputation site in his third fourth webspace looks stable to improved. We have been using endoform. The real concern today is the area over the lateral part of the fifth metatarsal head. This initially looked like a blister with central deep tissue injury I unroofed the blister last week there was nothing technically open but the deep tissue injury was concerning. T oday he comes in with a necrotic surface I remove this. The central part of this area which I am assuming was the deep tissue injury is necrotic I removed some of the necrotic tissue with a #15 scalpel and pickups. There is undermining around the wound edge here. 10/14; his original wound at the amputation site of his third fourth toe webspace looks improved we have gradually been making progress here. Using endoform. Unfortunately the area over the lateral part of the right fifth metatarsal head continues to deteriorate. Necrotic surface last week continues this week. The PCR  culture I did last week did not manage to make it to the right lab. I have looked at his x-ray the bone underneath does not look that bad to me however we have not yet had a radiologist interpretation. We have been using Iodoflex this week. 10/21; the  original amputation site of his third fourth toe webspace continues to look reasonably healthy and making slow but steady progress we have been using endoform The area on the right lateral fifth metatarsal head has been deteriorating problem. PCR culture grew MRSA. I have him on doxycycline. There is exposed bone in the wound under necrotic material. I still do not have the x-ray back from October 7 at least in terms of the report. Addendum 12/27/2022; I have looked back on this patient's vascular status. He is status post a right common femoral to posterior tibial artery bypass in 2020. He had a right common femoral to plantar posterior tibial bypass by Dr. Lenell Antu in November 2023. He also had a right third toe amputation at the same time. The wound that we have been dealing with the third toe amputation site was present after the surgery. He was last seen by vascular surgery on 06/21/2022 and was supposed to follow-up with Dr. Lenell Antu in 3 weeks although I do not see that appointment. I have included the major findings in the last right lower extremity angiogram on 01/21/2022 10/28;Marland Kitchen The x-ray report that I ordered several weeks ago is back. Indeed there was nothing under the fifth met head on the right however notable for bone necrosis under the wound on the left third toe. This suggest osteomyelitis. I am going to extend his doxycycline for 3 further weeks. PCR culture on the lateral foot grew MRSA I sent a secure text message to Dr. Juanetta Gosling. His reply was that there was no additional options for revascularization. The patient went to see Dr. Juanetta Gosling and that is the message they came up with. He did offer him an amputation [o Above-knee]. We have been using endoform to the third toe amputation site. Silver alginate to the new wound over the fifth metatarsal head 11/4; patient continues on doxycycline which she is handling well. He started hyperbaric oxygen today and tolerated the first treatment  well. We have been using endoform to the third toe amputation site which was the original wound he came to the clinic for. We are using Santyl and silver alginate to the wound over the fifth metatarsal head which was the wound that opened while he was under observation. Culture grew MRSA and the wound over the fifth metatarsal [name], [name] ([id number]) [id number] Page 3 of 10 head 11/11 continuing doxycycline. He is tolerating hyperbarics although I am continually notified about tachycardia in the 100-110 range. Looking back on EKGs shows this is a longstanding problem sinus tachycardia. He also has left anterior fascicular block. He does not describe chest pain The original wound on the third toe amputation site looks a lot better this is contracting however the area on the right lateral fifth metatarsal head only has a rim of height of granulation tissue around what looks to be periosteum 11/18; continuing doxycycline. He is tolerating hyperbarics. The original wound on the third toe amputation site is very small and healthy however the wound the developed on the right lateral fifth met head which probably was a combination of infection and ischemic has a nonviable surface. We have been using Hydrofera Blue and Santyl he continues on doxycycline. The doxycycline was same  that the x-ray from 10/31 showing high-grade phalangeal bone erosion suspicious for osteomyelitis. At that time there was nothing demonstrated on the fifth met head. I have repeated the x-ray of the foot looking specifically at the fifth met head however the report is not available. 12/3; the patient's original wound on the amputation site of his third toe is closed overall though it is a slight surface eschar I did not remove this. The problematic area on the right lateral fifth metatarsal head remains the major issue here. He is not a candidate for further revascularization. He has what looks to  be some viable granulation over the bone which is an improvement. However extending into the superior part of his foot a large open area over the bone itself. We used Apligraf #1 today. He continues with HBO 12/10; the original wound on the amputation site is eschared over. There is probably a small open area remaining here but I did not debride this. The original osteomyelitis was in this area. The subsequent wound is over the right lateral fifth metatarsal head. We applied Apligraf #1 last week. The wound has improved. The option for this patient was amputation at some level. He thinks he was told that this would be above-knee certainly complicating to decide in that direction. Fortunately his wound looks somewhat better today. No obvious infection Electronic Signature(s) Signed: 02/15/2023 10:03:28 AM By: [name] MD Entered By: [name] on 02/14/2023 13:00:28 -------------------------------------------------------------------------------- Physical Exam Details Patient Name: Date of Service: [name] 02/14/2023 3:00 PM Medical Record Number: [id number] Patient Account Number: [id number] Date of Birth/Sex: Treating RN: 01-30-1934 (89 y.o. M) Primary Care Provider: [name] Other Clinician: Referring Provider: Treating Provider/Extender: [name] in Treatment: 32 Constitutional Patient is hypertensive.. Pulse regular and within target range for patient.Marland Kitchen Respirations regular, non-labored and within target range.. Temperature is normal and within the target range for the patient.Marland Kitchen Appears in no distress. Notes Wound exam; third metatarsal head has a small amount of eschar over the surface. I did not debride this. The problematic area we are dealing with now is on the right fifth metatarsal head. This has made some improvement although there is still necrotic area towards the toe itself no debridement today. Apligraf #2 in the  standard fashion Electronic Signature(s) Signed: 02/15/2023 10:03:28 AM By: [name] MD Entered By: [name] on 02/14/2023 13:02:25 -------------------------------------------------------------------------------- Physician Orders Details Patient Name: Date of Service: [name] 02/14/2023 3:00 PM Medical Record Number: [id number] Patient Account Number: [id number] Date of Birth/Sex: Treating RN: Sep 05, 1933 (89 y.o. [name] Primary Care Provider: [name] Other Clinician: Referring Provider: Treating Provider/Extender: [name] in Treatment: [address] [place] ([id number]) [id number] Page 4 of 10 The following information was scribed by: Shawn Stall The information was scribed for: [name] Verbal / Phone Orders: No Diagnosis Coding ICD-10 Coding Code Description L97.512 Non-pressure chronic ulcer of other part of right foot with fat layer exposed L97.514 Non-pressure chronic ulcer of other part of right foot with necrosis of bone E11.621 Type 2 diabetes mellitus with foot ulcer I70.235 Atherosclerosis of native arteries of right leg with ulceration of other part of foot Follow-up Appointments ppointment in 1 week. - 02/21/2023 330pm Dr. Leanord Hawking Return A ppointment in 2 weeks. - Dr. Leanord Hawking 02/27/2023 315pm Return A Return appointment in 3 weeks. - Dr. Leanord Hawking (front office to schedule) Other: - [employer] Anesthetic (In clinic) Topical Lidocaine 5% applied  to wound bed Cellular or Tissue Based Products Wound #4 Right,Lateral Foot Cellular or Tissue Based Product Type: - apligraf-applied #1 02/08/2023 apligraf-applied #2 02/14/23 Bathing/ Shower/ Hygiene May shower with protection but do not get wound dressing(s) wet. Protect dressing(s) with water repellant cover (for example, large plastic bag) or a cast cover and may then take shower. Off-Loading Open toe  surgical shoe to: - right foot. wear while walking and standing. minimize pressure and walking/ standing. Hyperbaric Oxygen Therapy Wound #3 Right Amputation Site - Toe Evaluate for HBO Therapy Indication: - Wagner Grade 3 If appropriate for treatment, begin HBOT per protocol: 2.0 ATA for 90 Minutes without A Breaks ir Total Number of Treatments: - 30 One treatments per day (delivered Monday through Friday unless otherwise specified in Special Instructions below): Finger stick Blood Glucose Pre- and Post- HBOT Treatment. Follow Hyperbaric Oxygen Glycemia Protocol Give two 4oz orange juices in addition to Glucerna when the glycemic protocol is used. A frin (Oxymetazoline HCL) 0.05% nasal spray - 1 spray in both nostrils daily as needed prior to HBO treatment for difficulty clearing ears Wound Treatment Wound #3 - Amputation Site - Toe Wound Laterality: Right Cleanser: Vashe 5.8 (oz) Every Other Day/30 Days Discharge Instructions: Cleanse the wound with Vashe prior to applying a clean dressing using gauze sponges, not tissue or cotton balls. Topical: Skintegrity Hydrogel 4 (oz) Every Other Day/30 Days Discharge Instructions: Apply hydrogel as directed Secondary Dressing: Woven Gauze Sponges 2x2 in Every Other Day/30 Days Discharge Instructions: Apply over primary dressing as directed. Secured With: Paper Tape, 2x10 (in/yd) Every Other Day/30 Days Discharge Instructions: Secure dressing with tape as directed. Wound #4 - Foot Wound Laterality: Right, Lateral Cleanser: Soap and Water 1 x Per Day/30 Days Discharge Instructions: May shower and wash wound with dial antibacterial soap and water prior to dressing change. Cleanser: Wound Cleanser 1 x Per Day/30 Days Discharge Instructions: Cleanse the wound with wound cleanser prior to applying a clean dressing using gauze sponges, not tissue or cotton balls. Peri-Wound Care: Skin Prep 1 x Per Day/30 Days Discharge Instructions: Use skin prep as  directed Prim Dressing: apligraf #2 1 x Per Day/30 Days ary Discharge Instructions: applied by provider. Prim Dressing: cutimed sorbact swab with steristrip 1 x Per Day/30 Days ary Discharge Instructions: leave in place. [name], [name] ([id number]) [id number] Page 5 of 10 Prim Dressing: drawtex 1 x Per Day/30 Days ary Discharge Instructions: leave in place. Secondary Dressing: ABD Pad, 5x9 1 x Per Day/30 Days Discharge Instructions: Apply over primary dressing as directed. Secured With: [employer], 4.5x3.1 (in/yd) 1 x Per Day/30 Days Discharge Instructions: Secure with Kerlix as directed. Secured With: 47M Medipore H Soft Cloth Surgical T ape, 4 x 10 (in/yd) 1 x Per Day/30 Days Discharge Instructions: Secure with tape as directed. GLYCEMIA INTERVENTIONS PROTOCOL PRE-HBO GLYCEMIA INTERVENTIONS ACTION INTERVENTION Obtain pre-HBO capillary blood glucose (ensure 1 physician order is in chart). A. Notify HBO physician and await physician orders. 2 If result is 70 mg/dl or below: B. If the result meets the hospital definition of a critical result, follow hospital policy. A. Give patient an 8 ounce Glucerna Shake, an 8 ounce Ensure, or 8 ounces of a Glucerna/Ensure equivalent dietary supplement*. B. Wait 30 minutes. If result is 71 mg/dl to 742 mg/dl: C. Retest patients capillary blood glucose (CBG). D. If result greater than or equal to 110 mg/dl, proceed with HBO. If result less than 110 mg/dl, notify HBO physician and consider holding HBO. If result  is 131 mg/dl to 324 mg/dl: A. Proceed with HBO. A. Notify HBO physician and await physician orders. B. It is recommended to hold HBO and do If result is 250 mg/dl or greater: blood/urine ketone testing. C. If the result meets the hospital definition of a critical result, follow hospital policy. POST-HBO GLYCEMIA INTERVENTIONS ACTION INTERVENTION Obtain post HBO capillary blood glucose  (ensure 1 physician order is in chart). A. Notify HBO physician and await physician orders. 2 If result is 70 mg/dl or below: B. If the result meets the hospital definition of a critical result, follow hospital policy. A. Give patient an 8 ounce Glucerna Shake, an 8 ounce Ensure, or 8 ounces of a Glucerna/Ensure equivalent dietary supplement*. B. Wait 15 minutes for symptoms of If result is 71 mg/dl to 401 mg/dl: hypoglycemia (i.e. nervousness, anxiety, sweating, chills, clamminess, irritability, confusion, tachycardia or dizziness). C. If patient asymptomatic, discharge patient. If patient symptomatic, repeat capillary blood glucose (CBG) and notify HBO physician. If result is 101 mg/dl to 027 mg/dl: A. Discharge patient. A. Notify HBO physician and await physician orders. B. It is recommended to do blood/urine ketone If result is 250 mg/dl or greater: testing. C. If the result meets the hospital definition of a critical result, follow hospital policy. *Juice or candies are NOT equivalent products. If patient refuses the Glucerna or Ensure, please consult the hospital dietitian for an appropriate substitute. Electronic Signature(s) Signed: 02/14/2023 5:09:16 PM By: Shawn Stall RN, BSN Signed: 02/15/2023 10:03:28 AM By: [name] MD Entered By: Shawn Stall on 02/14/2023 12:42:03 [name], [name] ([id number]) [id number] Page 6 of 10 -------------------------------------------------------------------------------- Problem List Details Patient Name: Date of Service: [name] 02/14/2023 3:00 PM Medical Record Number: [id number] Patient Account Number: [id number] Date of Birth/Sex: Treating RN: [birth year] (89 y.o. [name], [name] Primary Care Provider: [name] Other Clinician: Referring Provider: Treating Provider/Extender: [name] in Treatment: 32 Active  Problems ICD-10 Encounter Code Description Active Date MDM Diagnosis L97.512 Non-pressure chronic ulcer of other part of right foot with fat layer exposed 07/04/2022 No Yes L97.514 Non-pressure chronic ulcer of other part of right foot with necrosis of bone 12/26/2022 No Yes E11.621 Type 2 diabetes mellitus with foot ulcer 07/04/2022 No Yes I70.235 Atherosclerosis of native arteries of right leg with ulceration of other part of 07/04/2022 No Yes foot Inactive Problems Resolved Problems Electronic Signature(s) Signed: 02/15/2023 10:03:28 AM By: [name] MD Entered By: [name] on 02/14/2023 12:58:52 -------------------------------------------------------------------------------- Progress Note Details Patient Name: Date of Service: [name] 02/14/2023 3:00 PM Medical Record Number: [id number] Patient Account Number: [id number] Date of Birth/Sex: Treating RN: 1933-08-02 (89 y.o. M) Primary Care Provider: [name] Other Clinician: Referring Provider: Treating Provider/Extender: [name] in Treatment: 32 Subjective History of Present Illness (HPI) 07/04/2022 Mr. Teigan Odaniel is an 89 year old male with a past medical history of uncontrolled, insulin dependent type 2 diabetes, CKD stage III, and COPD that presents to the clinic for a right foot wound. He has an extensive history of peripheral arterial disease to the right leg. He has had a right superficial femoral to posterior tibial artery bypass by Dr. Elonda Husky in 2020. He had a right common femoral to plantar posterior tibial artery bypass on 02/03/2022 by Dr. Lenell Antu and at that time he also had a right toe amputation. He states this wound site had healed completely. Prior to this he had amputations to the first and second digit to the right  foot. He presents today with an open wound to the previous right toe amputation site for the past 2 months. He has been following  with vein and vascular for this issue. Most recent ABIs were on 06/22/2022 that showed absent waveforms in the right lower extremity. Currently his right lower extremity bypass is occluded and no plan for another anteriorgram and has he likely has no revascularization options. Currently he has been using Betadine to the wound bed. He denies signs of infection. 07/11/2022 patient arrives in clinic today using Hydrofera Blue on the toe amputation site. The patient is revascularized and is not felt to have any other options for revascularization. We are using Hydrofera Blue and gauze. 5/13; patient presents for follow-up. He has been using Hydrofera Blue to the toe amputation site. He has had no issues. Wound is smaller. He has been approved for [facility] and was agreeable to have this placed in office today. [name], [name] ([id number]) [id number] Page 7 of 10 5/20; patient presents for follow-up. We have been using Kerecis to the wound bed. There is been improvement in healing. Unfortunately patient did not change the outer dressing for a week. There is slight maceration to the periwound. Unclear why he did not do this. 5/30; patient presents for follow-up. We have been using Kerecis to the wound bed. More maceration to the periwound today. No signs of infection. 6/10; patient presents for follow-up. He has been using Hydrofera Blue to the wound bed. He has no issues or complaints today. Maceration has almost completely resolved. 6/18; patient presents for follow-up. He has been using blast X and collagen to the wound bed without issues. Wound appears smaller today. 6/28; patient presents for follow-up. He has been using blast X and collagen to the wound bed. The wound is smaller. 7/11; patient presents for follow-up. He has been using blast X and collagen to the wound bed. Wound is smaller. He has slough accumulation. 7/25; patient presents for follow-up. He has been using  blast X and collagen to the wound bed. Wound is stable. He denies signs of infection. 8/26; patient presents for follow-up. He has been using blast X and collagen to the wound bed. Wound is slightly smaller. He has no issues or complaints. 8/29; patient presents for follow-up. He has been using blast X and Aquacel Ag to the wound bed. He has been using a Velcro wrap at his toes to keep the toes closer together to help with wound healing. He has no issues or complaints today. Wound is smaller. 9/12; patient presents for follow-up. He has been using blast X and Aquacel Ag to the wound bed. Unfortunately he has developed a pressure injury to the lateral aspect of the foot. He is not sure how this started. No open wound there. He denies pain. 9/23; this patient has a wound on the right foot at the amputation site of her third toe. It is right up against the fourth toe making it somewhat difficult to dress. When he was here last time he had a blister/DTI on the lateral aspect of the fifth MTP 9/30; patient has a wound on the right foot amputation site of his third toe. Probably in the third fourth webspace. I changed him to endoform last week. The wound looks somewhat better The area over the lateral part of the fifth metatarsal head which was blistered with some central deep tissue looking injury was leaking fluid today. The blister was unroofed. I used pickups and scissors to remove this.  I do not know that there is anything technically open here although there is certainly some central tissue at risk 10/7; his original wound at the amputation site in his third fourth webspace looks stable to improved. We have been using endoform. The real concern today is the area over the lateral part of the fifth metatarsal head. This initially looked like a blister with central deep tissue injury I unroofed the blister last week there was nothing technically open but the deep tissue injury was concerning. T oday he  comes in with a necrotic surface I remove this. The central part of this area which I am assuming was the deep tissue injury is necrotic I removed some of the necrotic tissue with a #15 scalpel and pickups. There is undermining around the wound edge here. 10/14; his original wound at the amputation site of his third fourth toe webspace looks improved we have gradually been making progress here. Using endoform. Unfortunately the area over the lateral part of the right fifth metatarsal head continues to deteriorate. Necrotic surface last week continues this week. The PCR culture I did last week did not manage to make it to the right lab. I have looked at his x-ray the bone underneath does not look that bad to me however we have not yet had a radiologist interpretation. We have been using Iodoflex this week. 10/21; the original amputation site of his third fourth toe webspace continues to look reasonably healthy and making slow but steady progress we have been using endoform The area on the right lateral fifth metatarsal head has been deteriorating problem. PCR culture grew MRSA. I have him on doxycycline. There is exposed bone in the wound under necrotic material. I still do not have the x-ray back from October 7 at least in terms of the report. Addendum 12/27/2022; I have looked back on this patient's vascular status. He is status post a right common femoral to posterior tibial artery bypass in 2020. He had a right common femoral to plantar posterior tibial bypass by Dr. Lenell Antu in November 2023. He also had a right third toe amputation at the same time. The wound that we have been dealing with the third toe amputation site was present after the surgery. He was last seen by vascular surgery on 06/21/2022 and was supposed to follow-up with Dr. Lenell Antu in 3 weeks although I do not see that appointment. I have included the major findings in the last right lower extremity angiogram on 01/21/2022 10/28;Marland Kitchen The  x-ray report that I ordered several weeks ago is back. Indeed there was nothing under the fifth met head on the right however notable for bone necrosis under the wound on the left third toe. This suggest osteomyelitis. I am going to extend his doxycycline for 3 further weeks. PCR culture on the lateral foot grew MRSA I sent a secure text message to Dr. Juanetta Gosling. His reply was that there was no additional options for revascularization. The patient went to see Dr. Juanetta Gosling and that is the message they came up with. He did offer him an amputation [o Above-knee]. We have been using endoform to the third toe amputation site. Silver alginate to the new wound over the fifth metatarsal head 11/4; patient continues on doxycycline which she is handling well. He started hyperbaric oxygen today and tolerated the first treatment well. We have been using endoform to the third toe amputation site which was the original wound he came to the clinic for. We are using Santyl  and silver alginate to the wound over the fifth metatarsal head which was the wound that opened while he was under observation. Culture grew MRSA and the wound over the fifth metatarsal head 11/11 continuing doxycycline. He is tolerating hyperbarics although I am continually notified about tachycardia in the 100-110 range. Looking back on EKGs shows this is a longstanding problem sinus tachycardia. He also has left anterior fascicular block. He does not describe chest pain The original wound on the third toe amputation site looks a lot better this is contracting however the area on the right lateral fifth metatarsal head only has a rim of height of granulation tissue around what looks to be periosteum 11/18; continuing doxycycline. He is tolerating hyperbarics. The original wound on the third toe amputation site is very small and healthy however the wound the developed on the right lateral fifth met head which probably was a combination of infection  and ischemic has a nonviable surface. We have been using Hydrofera Blue and Santyl he continues on doxycycline. The doxycycline was same that the x-ray from 10/31 showing high-grade phalangeal bone erosion suspicious for osteomyelitis. At that time there was nothing demonstrated on the fifth met head. I have repeated the x-ray of the foot looking specifically at the fifth met head however the report is not available. 12/3; the patient's original wound on the amputation site of his third toe is closed overall though it is a slight surface eschar I did not remove this. The problematic area on the right lateral fifth metatarsal head remains the major issue here. He is not a candidate for further revascularization. He has what looks to be some viable granulation over the bone which is an improvement. However extending into the superior part of his foot a large open area over the bone itself. We used Apligraf #1 today. He continues with HBO [name], [name] ([id number]) [id number] Page 8 of 10 12/10; the original wound on the amputation site is eschared over. There is probably a small open area remaining here but I did not debride this. The original osteomyelitis was in this area. The subsequent wound is over the right lateral fifth metatarsal head. We applied Apligraf #1 last week. The wound has improved. The option for this patient was amputation at some level. He thinks he was told that this would be above-knee certainly complicating to decide in that direction. Fortunately his wound looks somewhat better today. No obvious infection Objective Constitutional Patient is hypertensive.. Pulse regular and within target range for patient.Marland Kitchen Respirations regular, non-labored and within target range.. Temperature is normal and within the target range for the patient.Marland Kitchen Appears in no distress. Vitals Time Taken: 2:17 PM, Height: 71 in, Weight: 170 lbs, BMI: 23.7, Temperature: 97.3  F, Pulse: 82 bpm, Respiratory Rate: 18 breaths/min, Blood Pressure: 145/92 mmHg, Capillary Blood Glucose: 116 mg/dl. General Notes: Wound exam; third metatarsal head has a small amount of eschar over the surface. I did not debride this. The problematic area we are dealing with now is on the right fifth metatarsal head. This has made some improvement although there is still necrotic area towards the toe itself no debridement today. Apligraf #2 in the standard fashion Integumentary (Hair, Skin) Wound #3 status is Open. Original cause of wound was Gradually Appeared. The date acquired was: 04/07/2022. The wound has been in treatment 32 weeks. The wound is located on the Right Amputation Site - T The wound measures 0.1cm length x 0.1cm width x 0.1cm depth; 0.008cm^2 area and 0.001cm^3  volume. oe. There is Fat Layer (Subcutaneous Tissue) exposed. There is no tunneling or undermining noted. There is a small amount of serosanguineous drainage noted. The wound margin is epibole. There is no granulation within the wound bed. There is a large (67-100%) amount of necrotic tissue within the wound bed including Eschar. The periwound skin appearance exhibited: Callus. The periwound skin appearance did not exhibit: Crepitus, Excoriation, Induration, Rash, Scarring, Dry/Scaly, Maceration, Atrophie Blanche, Cyanosis, Ecchymosis, Hemosiderin Staining, Mottled, Pallor, Rubor, Erythema. Periwound temperature was noted as No Abnormality. Wound #4 status is Open. Original cause of wound was Shear/Friction. The date acquired was: 11/21/2022. The wound has been in treatment 10 weeks. The wound is located on the Right,Lateral Foot. The wound measures 1.5cm length x 1.3cm width x 0.4cm depth; 1.532cm^2 area and 0.613cm^3 volume. There is bone, tendon, and Fat Layer (Subcutaneous Tissue) exposed. There is no tunneling noted, however, there is undermining starting at 6:00 and ending at 10:00 with a maximum distance of 0.5cm.  There is a medium amount of serosanguineous drainage noted. The wound margin is distinct with the outline attached to the wound base. There is large (67-100%) red, pink granulation within the wound bed. There is a small (1-33%) amount of necrotic tissue within the wound bed including Adherent Slough. The periwound skin appearance exhibited: Callus, Maceration. The periwound skin appearance did not exhibit: Crepitus, Excoriation, Induration, Rash, Scarring, Dry/Scaly, Atrophie Blanche, Cyanosis, Ecchymosis, Hemosiderin Staining, Mottled, Pallor, Rubor, Erythema. Periwound temperature was noted as No Abnormality. 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 A skin graft procedure using a bioengineered skin substitute/cellular or tissue based product was performed by Maxwell Caul., MD with the following instrument(s): Forceps and Scissors. Apligraf was applied and secured with Steri-Strips and cutimed sorbact swab. 44 sq cm of product was utilized and 0 sq cm was wasted. Post Application, no dressing was applied. A Time Out was conducted at 15:30, prior to the start of the procedure. The procedure was tolerated well with a pain level of 0 throughout and a pain level of 0 following the procedure. Post procedure Diagnosis Wound #4: Same as Pre-Procedure . Plan Follow-up Appointments: Return Appointment in 1 week. - 02/21/2023 330pm Dr. Leanord Hawking Return Appointment in 2 weeks. - Dr. Leanord Hawking 02/27/2023 315pm [name], [name] ([id number]) [id number] Page 9 of  10 Return appointment in 3 weeks. - Dr. Leanord Hawking (front office to schedule) Other: - [employer] Anesthetic: (In clinic) Topical Lidocaine 5% applied to wound bed Cellular or Tissue Based Products: Wound #4 Right,Lateral Foot: Cellular or Tissue Based Product Type: - apligraf-applied #1 02/08/2023 apligraf-applied #2 02/14/23 Bathing/ Shower/ Hygiene: May shower with protection but do not get wound dressing(s) wet. Protect dressing(s) with water repellant cover (for example, large plastic bag) or a cast cover and may then take shower. Off-Loading: Open toe surgical shoe to: - right foot. wear while walking and standing. minimize pressure and walking/ standing. Hyperbaric Oxygen Therapy: Wound #3 Right Amputation Site - T oe: Evaluate for HBO Therapy Indication: - Wagner Grade 3 If appropriate for treatment, begin HBOT per protocol: 2.0 ATA for 90 Minutes without Air Breaks  T Number of Treatments: - 30 otal One treatments per day (delivered Monday through Friday unless otherwise specified in Special Instructions below): Finger stick Blood Glucose Pre- and Post- HBOT Treatment. Follow Hyperbaric Oxygen Glycemia Protocol Give two 4oz orange juices in addition to Glucerna when the glycemic protocol is used. Afrin (Oxymetazoline HCL) 0.05% nasal spray - 1 spray in both nostrils daily as needed prior to HBO treatment for difficulty clearing ears WOUND #3: - Amputation Site - T oe Wound Laterality: Right Cleanser: Vashe 5.8 (oz) Every Other Day/30 Days Discharge Instructions: Cleanse the wound with Vashe prior to applying a clean dressing using gauze sponges, not tissue or cotton balls. Topical: Skintegrity Hydrogel 4 (oz) Every Other Day/30 Days Discharge Instructions: Apply hydrogel as directed Secondary Dressing: Woven Gauze Sponges 2x2 in Every Other Day/30 Days Discharge Instructions: Apply over primary dressing as directed. Secured With: Paper T ape, 2x10 (in/yd) Every Other  Day/30 Days Discharge Instructions: Secure dressing with tape as directed. WOUND #4: - Foot Wound Laterality: Right, Lateral Cleanser: Soap and Water 1 x Per Day/30 Days Discharge Instructions: May shower and wash wound with dial antibacterial soap and water prior to dressing change. Cleanser: Wound Cleanser 1 x Per Day/30 Days Discharge Instructions: Cleanse the wound with wound cleanser prior to applying a clean dressing using gauze sponges, not tissue or cotton balls. Peri-Wound Care: Skin Prep 1 x Per Day/30 Days Discharge Instructions: Use skin prep as directed Prim Dressing: apligraf #2 1 x Per Day/30 Days ary Discharge Instructions: applied by provider. Prim Dressing: cutimed sorbact swab with steristrip 1 x Per Day/30 Days ary Discharge Instructions: leave in place. Prim Dressing: drawtex 1 x Per Day/30 Days ary Discharge Instructions: leave in place. Secondary Dressing: ABD Pad, 5x9 1 x Per Day/30 Days Discharge Instructions: Apply over primary dressing as directed. Secured With: [employer], 4.5x3.1 (in/yd) 1 x Per Day/30 Days Discharge Instructions: Secure with Kerlix as directed. Secured With: 19M Medipore H Soft Cloth Surgical T ape, 4 x 10 (in/yd) 1 x Per Day/30 Days Discharge Instructions: Secure with tape as directed. 1. Apligraf #2 in the standard fashion backing [place] into the undermining areas. 3. Complete HBO. Electronic Signature(s) Signed: 02/15/2023 10:03:28 AM By: [name] MD Entered By: [name] on 02/14/2023 13:05:43 -------------------------------------------------------------------------------- SuperBill Details Patient Name: Date of Service: [name] 02/14/2023 Medical Record Number: [id number] Patient Account Number: [id number] Date of Birth/Sex: Treating RN: 02/15/1934 (89 y.o. [name] Primary Care Provider: [name] Other Clinician: Referring Provider: Treating Provider/Extender: [name] in Treatment: 32 Diagnosis Coding ICD-10 Codes Code Description Ider, Nickson ([id number]) [id number] Page 10 of 10 [phone] Non-pressure chronic ulcer of other part of right foot with fat layer exposed L97.514 Non-pressure chronic ulcer of other part of right foot with necrosis of bone E11.621 Type 2 diabetes mellitus with foot ulcer I70.235 Atherosclerosis of native arteries of right leg with ulceration of other part of foot Facility Procedures : CPT4 Code: 41324401 Description: (Facility Use Only) Apligraf 1 SQ CM Modifier: Quantity: 44 : CPT4 Code: 02725366 Description: 15275 - SKIN SUB GRAFT FACE/NK/HF/G ICD-10 Diagnosis Description L97.514 Non-pressure chronic ulcer of other part of right foot with necrosis of bone E11.621 Type 2 diabetes mellitus with foot ulcer Modifier: Quantity: 1 Physician Procedures : CPT4 Code Description Modifier [id number] 15275 - WC PHYS SKIN SUB GRAFT FACE/NK/HF/G ICD-10 Diagnosis Description L97.514 Non-pressure chronic ulcer of other part of right foot with necrosis of bone E11.621 Type  2 diabetes mellitus with foot ulcer Quantity: 1 Electronic Signature(s) Signed: 02/15/2023 10:03:28 AM By: [name] MD Entered By: [name] on 02/14/2023 13:06:05

## 2023-02-16 ENCOUNTER — Encounter (HOSPITAL_BASED_OUTPATIENT_CLINIC_OR_DEPARTMENT_OTHER): Payer: PPO | Admitting: Internal Medicine

## 2023-02-16 ENCOUNTER — Other Ambulatory Visit: Payer: Self-pay

## 2023-02-16 DIAGNOSIS — L309 Dermatitis, unspecified: Secondary | ICD-10-CM

## 2023-02-16 DIAGNOSIS — E11621 Type 2 diabetes mellitus with foot ulcer: Secondary | ICD-10-CM | POA: Diagnosis not present

## 2023-02-16 LAB — GLUCOSE, CAPILLARY
Glucose-Capillary: 206 mg/dL — ABNORMAL HIGH (ref 70–99)
Glucose-Capillary: 155 mg/dL — ABNORMAL HIGH (ref 70–99)

## 2023-02-16 MED ORDER — TRIAMCINOLONE ACETONIDE 0.1 % EX CREA: TOPICAL_CREAM | CUTANEOUS | 0 refills | Status: DC | PRN

## 2023-02-16 NOTE — Progress Notes (Signed)
YONGUE, Andrei ([id number]) [id number] Page 1 of 1 Visit Report for 02/15/2023 SuperBill Details Patient Name: Date of Service: [name] [name] 02/15/2023 Medical Record Number: [id number] Patient Account Number: [id number] Date of Birth/Sex: Treating RN: [birth year] (89 y.o. [name] Primary Care Provider: [name] Other Clinician: Haywood Pao Referring Provider: Treating Provider/Extender: [name] in Treatment: 32 Diagnosis Coding ICD-10 Codes Code Description [phone] Non-pressure chronic ulcer of other part of right foot with fat layer exposed L97.514 Non-pressure chronic ulcer of other part of right foot with necrosis of bone E11.621 Type 2 diabetes mellitus with foot ulcer I70.235 Atherosclerosis of native arteries of right leg with ulceration of other part of foot Facility Procedures CPT4 Code Description Modifier Quantity [id number] G0277-(Facility Use Only) HBOT full body chamber, , 4 ICD-10 Diagnosis Description E11.621 Type 2 diabetes mellitus with foot ulcer 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.512 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.235 Atherosclerosis of native arteries of right leg with ulceration of other part of foot Physician Procedures Quantity CPT4 Code Description Modifier 5366440 99183 - WC PHYS HYPERBARIC OXYGEN THERAPY 1 ICD-10 Diagnosis Description E11.621 Type 2 diabetes mellitus with foot ulcer 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.512 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.235 Atherosclerosis of native arteries of right leg with ulceration of other part of foot Electronic Signature(s) Signed: 02/15/2023 4:32:42 PM By: Haywood Pao CHT EMT BS , , Signed:  02/15/2023 5:18:28 PM By: [name] MD Entered By: Haywood Pao on 02/15/2023 13:32:41

## 2023-02-17 NOTE — Progress Notes (Signed)
MIYATA, Ramirez ([id number]) [id number] Page 1 of 1 Visit Report for 02/16/2023 SuperBill Details Patient Name: Date of Service: [name] [name] 02/16/2023 Medical Record Number: [id number] Patient Account Number: [id number] Date of Birth/Sex: Treating RN: [birth year] (89 y.o. [name], [name] Primary Care Provider: [name] Other Clinician: Haywood Pao Referring Provider: Treating Provider/Extender: [name] in Treatment: 32 Diagnosis Coding ICD-10 Codes Code Description [phone] Non-pressure chronic ulcer of other part of right foot with fat layer exposed L97.514 Non-pressure chronic ulcer of other part of right foot with necrosis of bone E11.621 Type 2 diabetes mellitus with foot ulcer I70.235 Atherosclerosis of native arteries of right leg with ulceration of other part of foot Facility Procedures CPT4 Code Description Modifier Quantity [id number] G0277-(Facility Use Only) HBOT full body chamber, , 4 ICD-10 Diagnosis Description E11.621 Type 2 diabetes mellitus with foot ulcer 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.512 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.235 Atherosclerosis of native arteries of right leg with ulceration of other part of foot Physician Procedures Quantity CPT4 Code Description Modifier [id number] 99183 - WC PHYS HYPERBARIC OXYGEN THERAPY 1 ICD-10 Diagnosis Description E11.621 Type 2 diabetes mellitus with foot ulcer 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.512 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.235 Atherosclerosis of native arteries of right leg with ulceration of other part of foot Electronic Signature(s) Signed: 02/16/2023 4:46:31 PM By: Haywood Pao CHT EMT BS , , Signed:  02/16/2023 5:17:16 PM By: [name] MD Entered By: Haywood Pao on 02/16/2023 16:46:31

## 2023-02-17 NOTE — Progress Notes (Addendum)
SOUTHARDS, Yurem ([id number]) [id number] Page 1 of 3 Visit Report for 02/16/2023 HBO Details Patient Name: Date of Service: [name] 02/16/2023 11:30 A M Medical Record Number: [id number] Patient Account Number: [id number] Date of Birth/Sex: Treating RN: 06-04-1933 (89 y.o. [name] Primary Care [name]: [name] Other Clinician: [name] Referring [name]: Treating [name]/Extender: [name] in Treatment: 32 HBO Treatment Course Details Treatment Course Number: 1 Ordering Karmel Patricelli: [name] T Treatments Ordered: otal 40 HBO Treatment Start Date: 01/09/2023 HBO Indication: Diabetic Ulcer(s) of the Lower Extremity Standard/Conservative Wound Care tried and failed greater than or equal to 30 days Wound #3 Right Amputation Site - T oe HBO Treatment Details Treatment Number: 25 Patient Type: Outpatient Chamber Type: Monoplace Chamber Serial #: [id number] Treatment Protocol: 2.0 ATA with 90 minutes oxygen, and no air breaks Treatment Details Compression Rate Down: 1.5 psi / minute De-Compression Rate Up: 2.0 psi / minute Air breaks and breathing Decompress Decompress Compress Tx Pressure Begins Reached periods Begins Ends (leave unused spaces blank) Chamber Pressure (ATA 1 2 ------2 1 ) Clock Time (24 hr) 11:52 12:12 - - - - - - 13:42 13:50 Treatment Length: 118 (minutes) Treatment Segments: 4 Vital Signs Capillary Blood Glucose Reference Range: 80 - 120 mg / dl HBO Diabetic Blood Glucose Intervention Range: <131 mg/dl or >784 mg/dl Type: Time Vitals Blood Pulse: Respiratory Temperature: Capillary Blood Glucose Pulse Action Taken: Pressure: Rate: Glucose (mg/dl): Meter #: Oximetry (%) Taken: Pre 11:40 137/91 110 18 98.2 206 asymptomatic for tachycardia Post 13:51 142/88 79 16 97.2 155 none per protocol Treatment Response Treatment Toleration: Well Treatment  Completion Status: Treatment Completed without Adverse Event Treatment Notes [name] arrived and prepared for treatment. He presented with normal vital signs except heart rate of 110 bpm. Patient explains that pain and his aversion for the chamber cause his heart rate to increase and be irregular. Dr. Leanord Hawking is aware of this ongoing symptom. After performing a safety check, patient was placed in the chamber which was compressed with 100% oxygen at a rate of 1 psi/min at ventilation rate of 450 L/min, effectively reducing actual travel rate to approximately 0.74 psi/min. Speed was slowly increased at increments of 5 psi by decreasing the ventilation rate to ultimate level of 250 L/min. [name] tolerated the treatment and subsequent decompression at a rate of 1 psi/min. Patient denied any issues with ear equalization and/or pain associated with barotrauma. His post-treatment vital signs were normal. He was stable upon discharge. Additional Procedure Documentation Tissue Sevierity: Necrosis of bone [name] Notes No concern with treatment given Physician HBO AttestationTraquan [name], [name] ([id number]) [id number] Page 2 of 3 I certify that I supervised this HBO treatment in accordance with Medicare guidelines. A trained emergency response team is readily available per Yes hospital policies and procedures. Continue HBOT as ordered. Yes Electronic Signature(s) Signed: 02/16/2023 5:17:16 PM By: [name] Najjar MD Previous Signature: 02/16/2023 4:46:05 PM Version By: [name] CHT EMT BS , , Entered By: [name] on 02/16/2023 17:14:54 -------------------------------------------------------------------------------- HBO Safety Checklist Details Patient Name: Date of Service: [name] 02/16/2023 11:30 A M Medical Record Number: [id number] Patient Account Number: [id number] Date of Birth/Sex: Treating RN: 11-10-1933 (89 y.o. [name] Primary Care Henleigh Robello: [name] Other Clinician: [name] Referring [name]: Treating Will [name]/Extender: [name] in Treatment: 32 HBO Safety Checklist Items Safety Checklist Consent Form Signed Patient voided / foley secured and emptied When did  you last eato 0930 Bacon, Egg, Coffee Last dose of injectable or oral agent 0800 - Jardiance Ostomy pouch emptied and vented if applicable NA All implantable devices assessed, documented and approved NA Intravenous access site secured and place NA Valuables secured Linens and cotton and cotton/polyester blend (less than 51% polyester) Personal oil-based products / skin lotions / body lotions removed Wigs or hairpieces removed NA Smoking or tobacco materials removed NA Books / newspapers / magazines / loose paper removed Cologne, aftershave, perfume and deodorant removed Jewelry removed (may wrap wedding band) Make-up removed NA Hair care products removed Battery operated devices (external) removed Heating patches and chemical warmers removed Titanium eyewear removed Nail polish cured greater than 10 hours NA Casting material cured greater than 10 hours NA Hearing aids removed removed Loose dentures or partials removed dentures removed Prosthetics have been removed NA Patient demonstrates correct use of air break device (if applicable) Patient concerns have been addressed Patient grounding bracelet on and cord attached to chamber Specifics for Inpatients (complete in addition to above) Medication sheet sent with patient NA Intravenous medications needed or due during therapy sent with patient NA Drainage tubes (e.g. nasogastric tube or chest tube secured and vented) NA Endotracheal or Tracheotomy tube secured NA Cuff deflated of air and inflated with saline NA Airway suctioned NA Notes Paper version used prior to treatment start. Electronic  Signature(s) [name] ([id number]) [id number] Page 3 of 3 Signed: 02/16/2023 4:41:46 PM By: [name] CHT EMT BS , , Entered By: [name] on 02/16/2023 16:41:46

## 2023-02-17 NOTE — Progress Notes (Signed)
JEZ, Herold ([id number]) [id number] Page 1 of 1 Visit Report for 02/16/2023 Arrival Information Details Patient Name: Date of Service: [name] 02/16/2023 11:30 A M Medical Record Number: [id number] Patient Account Number: [id number] Date of Birth/Sex: Treating RN: 1933/10/03 (89 y.o. [name] Primary Care Halford Goetzke: [name] Other Clinician: Haywood Pao Referring Isaiah Cianci: Treating Brynlee Pennywell/Extender: [name] in Treatment: 32 Visit Information History Since Last Visit All ordered tests and consults were completed: Yes Patient Arrived: Ambulatory Added or deleted any medications: No Arrival Time: 11:25 Any new allergies or adverse reactions: No Accompanied By: self Had a fall or experienced change in No Transfer Assistance: None activities of daily living that may affect Patient Identification Verified: Yes risk of falls: Secondary Verification Process Completed: Yes Signs or symptoms of abuse/neglect since last visito No Patient Requires Transmission-Based Precautions: No Hospitalized since last visit: No Patient Has Alerts: Yes Implantable device outside of the clinic excluding No Patient Alerts: Patient on Blood Thinner cellular tissue based products placed in the center since last visit: Pain Present Now: No Electronic Signature(s) Signed: 02/16/2023 4:39:41 PM By: Haywood Pao CHT EMT BS , , Entered By: Haywood Pao on 02/16/2023 16:39:41 -------------------------------------------------------------------------------- Vitals Details Patient Name: Date of Service: [name] 02/16/2023 11:30 A M Medical Record Number: [id number] Patient Account Number: [id number] Date of Birth/Sex: Treating RN: [birth year] (89 y.o. [name] Primary Care Lavina Resor: [name] Other Clinician: Haywood Pao Referring Keison Glendinning: Treating Felton Buczynski/Extender:  [name] in Treatment: 32 Vital Signs Time Taken: 11:40 Temperature (F): 98.2 Height (in): 71 Pulse (bpm): 110 Weight (lbs): 170 Respiratory Rate (breaths/min): 18 Body Mass Index (BMI): 23.7 Blood Pressure (mmHg): 137/91 Capillary Blood Glucose (mg/dl): 086 Reference Range: 80 - 120 mg / dl Electronic Signature(s) Signed: 02/16/2023 4:40:05 PM By: Haywood Pao CHT EMT BS , , Entered By: Haywood Pao on 02/16/2023 16:40:04

## 2023-02-20 ENCOUNTER — Encounter (HOSPITAL_BASED_OUTPATIENT_CLINIC_OR_DEPARTMENT_OTHER): Payer: PPO | Admitting: Internal Medicine

## 2023-02-20 DIAGNOSIS — E11621 Type 2 diabetes mellitus with foot ulcer: Secondary | ICD-10-CM | POA: Diagnosis not present

## 2023-02-20 LAB — GLUCOSE, CAPILLARY
Glucose-Capillary: 226 mg/dL — ABNORMAL HIGH (ref 70–99)
Glucose-Capillary: 142 mg/dL — ABNORMAL HIGH (ref 70–99)

## 2023-02-20 NOTE — Progress Notes (Signed)
[name], [name] ([id number]) [id number] Page 1 of 2 Visit Report for 02/20/2023 Arrival Information Details Patient Name: Date of Service: [name] 02/20/2023 11:30 A M Medical Record Number: [id number] Patient Account Number: [id number] Date of Birth/Sex: Treating RN: 1934/01/19 (89 y.o. [name] Primary Care Neil Brickell: [name] Other Clinician: Haywood Pao Referring Moris Ratchford: Treating Yanique Mulvihill/Extender: [name] in Treatment: 65 Visit Information History Since Last Visit All ordered tests and consults were completed: Yes Patient Arrived: Ambulatory Added or deleted any medications: No Arrival Time: 11:18 Any new allergies or adverse reactions: No Accompanied By: self Had a fall or experienced change in No Transfer Assistance: None activities of daily living that may affect Patient Identification Verified: Yes risk of falls: Secondary Verification Process Completed: Yes Signs or symptoms of abuse/neglect since last visito No Patient Requires Transmission-Based Precautions: No Hospitalized since last visit: No Patient Has Alerts: Yes Implantable device outside of the clinic excluding No Patient Alerts: Patient on Blood Thinner cellular tissue based products placed in the center since last visit: Pain Present Now: No Electronic Signature(s) Signed: 02/20/2023 2:57:14 PM By: Haywood Pao CHT EMT BS , , Entered By: Haywood Pao on 02/20/2023 14:57:14 -------------------------------------------------------------------------------- Encounter Discharge Information Details Patient Name: Date of Service: [name] 02/20/2023 11:30 A M Medical Record Number: [id number] Patient Account Number: [id number] Date of Birth/Sex: Treating RN: 1934-01-03 (89 y.o. [name] Primary Care Sherriann Szuch: [name] Other Clinician: Haywood Pao Referring  Ionna Avis: Treating Deshondra Worst/Extender: [name] in Treatment: 33 Encounter Discharge Information Items Discharge Condition: Stable Ambulatory Status: Ambulatory Discharge Destination: Home Transportation: Private Auto Accompanied By: daughter Schedule Follow-up Appointment: No Clinical Summary of Care: Electronic Signature(s) Signed: 02/20/2023 3:09:44 PM By: Haywood Pao CHT EMT BS , , Entered By: Haywood Pao on 02/20/2023 15:09:43 Davidow, Ihsan ([id number]) [id number] Page 2 of 2 -------------------------------------------------------------------------------- Vitals Details Patient Name: Date of Service: [name] 02/20/2023 11:30 A M Medical Record Number: [id number] Patient Account Number: [id number] Date of Birth/Sex: Treating RN: 10/29/1933 (89 y.o. [name] Primary Care Jullianna Gabor: [name] Other Clinician: Haywood Pao Referring Lucky Alverson: Treating Knox Holdman/Extender: [name] in Treatment: 33 Vital Signs Time Taken: 11:23 Temperature (F): 97.4 Height (in): 71 Pulse (bpm): 86 Weight (lbs): 170 Respiratory Rate (breaths/min): 18 Body Mass Index (BMI): 23.7 Blood Pressure (mmHg): 157/56 Capillary Blood Glucose (mg/dl): 433 Reference Range: 80 - 120 mg / dl Electronic Signature(s) Signed: 02/20/2023 2:57:38 PM By: Haywood Pao CHT EMT BS , , Entered By: Haywood Pao on 02/20/2023 14:57:38

## 2023-02-20 NOTE — Progress Notes (Signed)
BERENSON, Darvin ([id number]) [id number] Page 1 of 1 Visit Report for 02/20/2023 SuperBill Details Patient Name: Date of Service: [name] [name] 02/20/2023 Medical Record Number: [id number] Patient Account Number: [id number] Date of Birth/Sex: Treating RN: April 30, 1933 (89 y.o. 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 E11.621 Type 2 diabetes mellitus with foot ulcer I70.235 Atherosclerosis of native arteries of right leg with ulceration of other part of foot Facility Procedures CPT4 Code Description Modifier Quantity [id number]-(Facility Use Only) HBOT full body chamber, , 4 ICD-10 Diagnosis Description E11.621 Type 2 diabetes mellitus with foot ulcer 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.512 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.235 Atherosclerosis of native arteries of right leg with ulceration of other part of foot Physician Procedures Quantity CPT4 Code Description Modifier [id number] - WC PHYS HYPERBARIC OXYGEN THERAPY 1 ICD-10 Diagnosis Description E11.621 Type 2 diabetes mellitus with foot ulcer 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.512 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

## 2023-02-20 NOTE — Progress Notes (Addendum)
[name], [name] ([id number]) [id number] Page 1 of 3 Visit Report for 02/20/2023 HBO Details Patient Name: Date of Service: [name] 02/20/2023 11:30 A M Medical Record Number: [id number] Patient Account Number: [id number] Date of Birth/Sex: Treating RN: [birth year] (89 y.o. [name] Primary Care Antonietta Lansdowne: [name] Other Clinician: Haywood Pao Referring Shravya Wickwire: Treating Ashwath Lasch/Extender: [name] in Treatment: 33 HBO Treatment Course Details Treatment Course Number: 1 Ordering Delmy Holdren: [name] T Treatments Ordered: otal 40 HBO Treatment Start Date: 01/09/2023 HBO Indication: Diabetic Ulcer(s) of the Lower Extremity Standard/Conservative Wound Care tried and failed greater than or equal to 30 days Wound #3 Right Amputation Site - T oe HBO Treatment Details Treatment Number: 26 Patient Type: Outpatient Chamber Type: Monoplace Chamber Serial #: [id number] Treatment Protocol: 2.0 ATA with 90 minutes oxygen, and no air breaks Treatment Details Compression Rate Down: 1.0 psi / minute De-Compression Rate Up: 2.0 psi / minute Air breaks and breathing Decompress Decompress Compress Tx Pressure Begins Reached periods Begins Ends (leave unused spaces blank) Chamber Pressure (ATA 1 2 ------2 1 ) Clock Time (24 hr) 12:14 12:34 - - - - - - 14:04 14:14 Treatment Length: 120 (minutes) Treatment Segments: 4 Vital Signs Capillary Blood Glucose Reference Range: 80 - 120 mg / dl HBO Diabetic Blood Glucose Intervention Range: <131 mg/dl or >952 mg/dl Type: Time Vitals Blood Pulse: Respiratory Temperature: Capillary Blood Glucose Pulse Action Taken: Pressure: Rate: Glucose (mg/dl): Meter #: Oximetry (%) Taken: Pre 11:23 157/56 86 18 97.4 226 asymptomatic for hypotension Post 14:17 136/93 83 18 97.3 142 none per protocol Treatment Response Treatment Toleration: Well Treatment  Completion Status: Treatment Completed without Adverse Event Treatment Notes [name] arrived and prepared for treatment. His vital signs were within normal range except for diastolic BP of 56 mmHg. Patient denied symptoms related to hypotension. After performing a safety check, patient was placed in the chamber which was compressed with 100% oxygen at a rate of 1 psi/min at ventilation rate of 450 L/min, effectively reducing actual travel rate to approximately 0.74 psi/min. Speed was slowly increased at increments of 5 psig by decreasing the ventilation rate to ultimate level of 250 L/min. [name] tolerated the treatment and subsequent decompression at a rate of 2 psi/min. Patient denied any issues with ear equalization and/or pain associated with barotrauma. His post-treatment vital signs were normal. He was stable upon discharge. Additional Procedure Documentation Tissue Sevierity: Necrosis of bone Taite Baldassari Notes No concerns with treatment given. Physician HBO AttestationTrumaine Musselman, Nadara Mode ([id number]) [id number] Page 2 of 3 I certify that I supervised this HBO treatment in accordance with Medicare guidelines. A trained emergency response team is readily available per Yes hospital policies and procedures. Continue HBOT as ordered. Yes Electronic Signature(s) Signed: 02/20/2023 4:04:15 PM By: [name] MD Previous Signature: 02/20/2023 3:08:29 PM Version By: Haywood Pao CHT EMT BS , , Entered By: [name] on 02/20/2023 16:01:18 -------------------------------------------------------------------------------- HBO Safety Checklist Details Patient Name: Date of Service: [name] 02/20/2023 11:30 A M Medical Record Number: [id number] Patient Account Number: [id number] Date of Birth/Sex: Treating RN: Aug 02, 1933 (89 y.o. [name] Primary Care Aralynn Brake: [name] Other Clinician: Haywood Pao Referring  Nigel Ericsson: Treating Kaysen Sefcik/Extender: [name] in Treatment: 33 HBO Safety Checklist Items Safety Checklist Consent Form Signed Patient voided / foley secured and emptied When did you last eato 0930 Bacon, Egg, Toast, Coffee Last dose of injectable or oral agent 0800 Jardiance Ostomy pouch  emptied and vented if applicable NA All implantable devices assessed, documented and approved NA Intravenous access site secured and place NA Valuables secured Linens and cotton and cotton/polyester blend (less than 51% polyester) Personal oil-based products / skin lotions / body lotions removed Wigs or hairpieces removed NA Smoking or tobacco materials removed NA Books / newspapers / magazines / loose paper removed Cologne, aftershave, perfume and deodorant removed Jewelry removed (may wrap wedding band) Make-up removed NA Hair care products removed Battery operated devices (external) removed Heating patches and chemical warmers removed Titanium eyewear removed Nail polish cured greater than 10 hours NA Casting material cured greater than 10 hours NA Hearing aids removed removed Loose dentures or partials removed dentures removed Prosthetics have been removed NA Patient demonstrates correct use of air break device (if applicable) Patient concerns have been addressed Patient grounding bracelet on and cord attached to chamber Specifics for Inpatients (complete in addition to above) Medication sheet sent with patient NA Intravenous medications needed or due during therapy sent with patient NA Drainage tubes (e.g. nasogastric tube or chest tube secured and vented) NA Endotracheal or Tracheotomy tube secured NA Cuff deflated of air and inflated with saline NA Airway suctioned NA Notes Paper version used prior to treatment start. Electronic Signature(s) [name], [name] ([id number]) [id number] Page 3 of 3 Signed: 02/20/2023  2:58:41 PM By: Haywood Pao CHT EMT BS , , Entered By: Haywood Pao on 02/20/2023 14:58:41

## 2023-02-21 ENCOUNTER — Encounter (HOSPITAL_BASED_OUTPATIENT_CLINIC_OR_DEPARTMENT_OTHER): Payer: PPO | Admitting: Internal Medicine

## 2023-02-21 DIAGNOSIS — E11621 Type 2 diabetes mellitus with foot ulcer: Secondary | ICD-10-CM | POA: Diagnosis not present

## 2023-02-21 LAB — GLUCOSE, CAPILLARY
Glucose-Capillary: 171 mg/dL — ABNORMAL HIGH (ref 70–99)
Glucose-Capillary: 209 mg/dL — ABNORMAL HIGH (ref 70–99)

## 2023-02-21 NOTE — Progress Notes (Signed)
[name], [name] ([id number]) [id number] Page 1 of 10 Visit Report for 02/21/2023 Arrival Information Details Patient Name: Date of Service: [name] 02/21/2023 3:30 PM Medical Record Number: [id number] Patient Account Number: [id number] Date of Birth/Sex: Treating RN: 08-31-1933 (89 y.o. M) Primary Care [name]: [name] Other Clinician: Referring [name]: Treating [name]/Extender: [name] in Treatment: 65 Visit Information History Since Last Visit Added or deleted any medications: No Patient Arrived: Ambulatory Any new allergies or adverse reactions: No Arrival Time: 15:26 Had a fall or experienced change in No Accompanied By: daughter activities of daily living that may affect Transfer Assistance: None risk of falls: Patient Identification Verified: Yes Signs or symptoms of abuse/neglect since last visito No Secondary Verification Process Completed: Yes Hospitalized since last visit: No Patient Requires Transmission-Based Precautions: No Implantable device outside of the clinic excluding No Patient Has Alerts: Yes cellular tissue based products placed in the center Patient Alerts: Patient on Blood Thinner since last visit: Has Dressing in Place as Prescribed: Yes Pain Present Now: Yes Electronic Signature(s) Unsigned Entered By: Thayer Dallas on 02/21/2023 12:26:58 -------------------------------------------------------------------------------- Encounter Discharge Information Details Patient Name: Date of Service: [name] 02/21/2023 3:30 PM Medical Record Number: [id number] Patient Account Number: [id number] Date of Birth/Sex: Treating RN: 28-Dec-1933 (89 y.o. [name] Primary Care Aidynn Polendo: [name] Other Clinician: Referring Jonell Brumbaugh: Treating [name]/Extender: [name] in Treatment: 59 Encounter Discharge Information Items  Post Procedure Vitals Discharge Condition: Stable Temperature (F): 98.1 Ambulatory Status: Ambulatory Pulse (bpm): 86 Discharge Destination: Home Respiratory Rate (breaths/min): 18 Transportation: Private Auto Blood Pressure (mmHg): 149/89 Accompanied By: granddaughter Schedule Follow-up Appointment: Yes Clinical Summary of Care: Electronic Signature(s) Unsigned Entered By: [name] on 02/21/2023 13:03:50 Signature(s): [name], [name] ([id number]) [id number] Date(s): [id number] Page 2 of 10 -------------------------------------------------------------------------------- Lower Extremity Assessment Details Patient Name: Date of Service: [name] 02/21/2023 3:30 PM Medical Record Number: [id number] Patient Account Number: [id number] Date of Birth/Sex: Treating RN: 1933-08-07 (89 y.o. [name] Primary Care Jeanett Antonopoulos: [name] Other Clinician: Referring Mirca Yale: Treating [name]/Extender: [name] in Treatment: 33 Edema Assessment Assessed: [name]: No] [name]: Yes] Edema: [Left: N] [Right: o] Calf Left: Right: Point of Measurement: From Medial Instep 33 cm Ankle Left: Right: Point of Measurement: From Medial Instep 22 cm Vascular Assessment Pulses: Dorsalis Pedis Palpable: [Right:Yes] Extremity colors, hair growth, and conditions: Extremity Color: [Right:Normal] Hair Growth on Extremity: [Right:No] Temperature of Extremity: [Right:Warm] Capillary Refill: [Right:< 3 seconds] Dependent Rubor: [Right:No No] Electronic Signature(s) Unsigned Entered By: [name] on 02/21/2023 12:43:03 -------------------------------------------------------------------------------- Multi Wound Chart Details Patient Name: Date of Service: [name] 02/21/2023 3:30 PM Medical Record Number: [id number] Patient Account Number: [id number] Date of Birth/Sex: Treating RN: 12/05/1933 (89 y.o.  M) Primary Care Esmae [name]: [name] Other Clinician: Referring Emaan Gary: Treating Zala Degrasse/Extender: [name] in Treatment: 33 Vital Signs Height(in): 71 Capillary Blood Glucose(mg/dl): 626 Weight(lbs): 948 Pulse(bpm): 86 Body Mass Index(BMI): 23.7 Blood Pressure(mmHg): 149/89 Temperature(F): 98.1 Respiratory Rate(breaths/min): 18 [name], [name] ([id number]) [3:Photos:] [[id number] Page 3 of 10 N/A] Right Amputation Site - Toe Right, Lateral Foot N/A Wound Location: Gradually Appeared Shear/Friction N/A Wounding Event: Diabetic Wound/Ulcer of the Lower Diabetic Wound/Ulcer of the Lower N/A Primary Etiology: Extremity Extremity N/A Pressure Ulcer N/A Secondary Etiology: Cataracts, Anemia, Asthma, Chronic Cataracts, Anemia, Asthma, Chronic N/A Comorbid History: Obstructive Pulmonary Disease Obstructive Pulmonary Disease (COPD), Peripheral Arterial Disease, (COPD), Peripheral Arterial Disease, Type  II Diabetes, Gout, Osteoarthritis, Type II Diabetes, Gout, Osteoarthritis, Neuropathy Neuropathy 04/07/2022 11/21/2022 N/A Date Acquired: 47 11 N/A Weeks of Treatment: Open Open N/A Wound Status: No No N/A Wound Recurrence: 0.1x0.1x0.1 2.9x2x0.9 N/A Measurements L x W x D (cm) 0.008 4.555 N/A A (cm) : rea 0.001 4.1 N/A Volume (cm) : 99.70% 12.10% N/A % Reduction in A rea: 99.90% -691.50% N/A % Reduction in Volume: 12 Starting Position 1 (o'clock): 12 Ending Position 1 (o'clock): 0.5 Maximum Distance 1 (cm): N/A Yes N/A Undermining: Grade 3 Grade 2 N/A Classification: Small Medium N/A Exudate A mount: Serosanguineous Serosanguineous N/A Exudate Type: red, brown red, brown N/A Exudate Color: Epibole Distinct, outline attached N/A Wound Margin: None Present (0%) Large (67-100%) N/A Granulation A mount: N/A Red, Pink N/A Granulation Quality: Large (67-100%) Small (1-33%) N/A Necrotic A  mount: Eschar Adherent Slough N/A Necrotic Tissue: Fat Layer (Subcutaneous Tissue): Yes Fat Layer (Subcutaneous Tissue): Yes N/A Exposed Structures: Fascia: No Tendon: Yes Tendon: No Bone: Yes Muscle: No Fascia: No Joint: No Muscle: No Bone: No Joint: No Large (67-100%) Small (1-33%) N/A Epithelialization: Callus: Yes Callus: Yes N/A Periwound Skin Texture: Excoriation: No Excoriation: No Induration: No Induration: No Crepitus: No Crepitus: No Rash: No Rash: No Scarring: No Scarring: No Maceration: No Maceration: Yes N/A Periwound Skin Moisture: Dry/Scaly: No Dry/Scaly: No Atrophie Blanche: No Atrophie Blanche: No N/A Periwound Skin Color: Cyanosis: No Cyanosis: No Ecchymosis: No Ecchymosis: No Erythema: No Erythema: No Hemosiderin Staining: No Hemosiderin Staining: No Mottled: No Mottled: No Pallor: No Pallor: No Rubor: No Rubor: No No Abnormality No Abnormality N/A Temperature: N/A Cellular or Tissue Based Product N/A Procedures Performed: Treatment Notes Wound #3 (Amputation Site - Toe) Wound Laterality: Right Cleanser Vashe 5.8 (oz) Discharge Instruction: Cleanse the wound with Vashe prior to applying a clean dressing using gauze sponges, not tissue or cotton balls. Peri-Wound Care Topical Skintegrity Hydrogel 4 (oz) Discharge Instruction: Apply hydrogel as directed Primary Dressing [name], [name] ([id number]) [id number] Page 4 of 10 Secondary Dressing Woven Gauze Sponges 2x2 in Discharge Instruction: Apply over primary dressing as directed. Secured With Paper Tape, 2x10 (in/yd) Discharge Instruction: Secure dressing with tape as directed. Compression Wrap Compression Stockings Add-Ons Wound #4 (Foot) Wound Laterality: Right, Lateral Cleanser Soap and Water Discharge Instruction: May shower and wash wound with dial antibacterial soap and water prior to dressing change. Wound Cleanser Discharge  Instruction: Cleanse the wound with wound cleanser prior to applying a clean dressing using gauze sponges, not tissue or cotton balls. Peri-Wound Care Skin Prep Discharge Instruction: Use skin prep as directed Topical Primary Dressing apligraf #3 Discharge Instruction: applied by Cheryl Stabenow. cutimed sorbact swab with steristrip Discharge Instruction: leave in place. drawtex Discharge Instruction: leave in place. Secondary Dressing ABD Pad, 5x9 Discharge Instruction: Apply over primary dressing as directed. Secured With [employer], 4.5x3.1 (in/yd) Discharge Instruction: Secure with Kerlix as directed. 1M Medipore H Soft Cloth Surgical T ape, 4 x 10 (in/yd) Discharge Instruction: Secure with tape as directed. Compression Wrap Compression Stockings Add-Ons Electronic Signature(s) Signed: 02/21/2023 4:32:13 PM By: [name] MD Entered By: [name] on 02/21/2023 13:08:55 -------------------------------------------------------------------------------- Multi-Disciplinary Care Plan Details Patient Name: Date of Service: [name] 02/21/2023 3:30 PM Medical Record Number: [id number] Patient Account Number: [id number] Date of Birth/Sex: Treating RN: 1933-07-08 (89 y.o. [name] Primary Care Verdelle Valtierra: [name] Other Clinician: Referring Kinzley Savell: Treating Momen Ham/Extender: [name] in Treatment: [address] [place] ([id number]) [id number] Page 5 of 10 Active  Inactive Wound/Skin Impairment Nursing Diagnoses: Impaired tissue integrity Knowledge deficit related to ulceration/compromised skin integrity Goals: Patient will have a decrease in wound volume by X% from date: (specify in notes) Date Initiated: 07/04/2022 Target Resolution Date: 03/31/2023 Goal Status: Active Patient/caregiver will verbalize understanding of skin care regimen Date Initiated: 07/04/2022 Target Resolution  Date: 03/30/2023 Goal Status: Active Ulcer/skin breakdown will have a volume reduction of 30% by week 4 Date Initiated: 07/04/2022 Date Inactivated: 10/27/2022 Target Resolution Date: 11/05/2022 Goal Status: Met Ulcer/skin breakdown will have a volume reduction of 50% by week 8 Date Initiated: 07/04/2022 Date Inactivated: 10/27/2022 Target Resolution Date: 11/05/2022 Goal Status: Met Interventions: Assess patient/caregiver ability to obtain necessary supplies Assess patient/caregiver ability to perform ulcer/skin care regimen upon admission and as needed Assess ulceration(s) every visit Notes: Electronic Signature(s) Unsigned Entered By: [name] on 02/21/2023 12:45:46 -------------------------------------------------------------------------------- Pain Assessment Details Patient Name: Date of Service: [name] 02/21/2023 3:30 PM Medical Record Number: [id number] Patient Account Number: [id number] Date of Birth/Sex: Treating RN: 15-Feb-1934 (89 y.o. M) Primary Care Adiah Guereca: [name] Other Clinician: Referring Dollie Bressi: Treating Honestee Revard/Extender: [name] in Treatment: 33 Active Problems Location of Pain Severity and Description of Pain Patient Has Paino Yes Site Locations Pain LocationErcole Idema, [name] ([id number]) [id number] Page 6 of 10 Pain Location: Generalized Pain, Pain in Ulcers Rate the pain. Current Pain Level: 2 Pain Management and Medication Current Pain Management: Electronic Signature(s) Unsigned Entered By: Thayer Dallas on 02/21/2023 12:27:16 -------------------------------------------------------------------------------- Patient/Caregiver Education Details Patient Name: Date of Service: [name] 12/17/2024andnbsp3:30 PM Medical Record Number: [id number] Patient Account Number: [id number] Date of Birth/Gender: Treating RN: October 18, 1933 (89 y.o. [name] Primary Care Physician: [name] Other Clinician: Referring Physician: Treating Physician/Extender: [name] in Treatment: 32 Education Assessment Education Provided To: Patient Education Topics Provided Wound/Skin Impairment: Handouts: Caring for Your Ulcer Methods: Explain/Verbal Responses: Reinforcements needed Electronic Signature(s) Unsigned Entered By: [name] on 02/21/2023 12:45:59 Wound Assessment Details -------------------------------------------------------------------------------- Ellery [name] ([id number]) [id number] Page 7 of 10 Patient Name: Date of Service: [name] 02/21/2023 3:30 PM Medical Record Number: [id number] Patient Account Number: [id number] Date of Birth/Sex: Treating RN: [birth year] (89 y.o. M) Primary Care Wladyslaw Henrichs: [name] Other Clinician: Referring Teiana Hajduk: Treating Krisanne Lich/Extender: [name] in Treatment: 33 Wound Status Wound Number: 3 Primary Diabetic Wound/Ulcer of the Lower Extremity Etiology: Wound Location: Right Amputation Site - Toe Wound Open Wounding Event: Gradually Appeared Status: Date Acquired: 04/07/2022 Comorbid Cataracts, Anemia, Asthma, Chronic Obstructive Pulmonary Weeks Of Treatment: 33 History: Disease (COPD), Peripheral Arterial Disease, Type II Diabetes, Clustered Wound: No Gout, Osteoarthritis, Neuropathy Photos Wound Measurements Length: (cm) 0.1 % Reduction in Area: 99.7% Width: (cm) 0.1 % Reduction in Volume: 99.9% Depth: (cm) 0.1 Epithelialization: Large (67-100%) Area: (cm) 0.008 Volume: (cm) 0.001 Wound Description Classification: Grade 3 Foul Odor After Cleansing: No Wound Margin: Epibole Slough/Fibrino No Exudate Amount: Small Exudate Type: Serosanguineous Exudate Color: red, brown Wound Bed Granulation Amount: None Present (0%) Exposed Structure Necrotic Amount:  Large (67-100%) Fascia Exposed: No Necrotic Quality: Eschar Fat Layer (Subcutaneous Tissue) Exposed: Yes Tendon Exposed: No Muscle Exposed: No Joint Exposed: No Bone Exposed: No Periwound Skin Texture Texture Color No Abnormalities Noted: No No Abnormalities Noted: No Callus: Yes Atrophie Blanche: No Crepitus: No Cyanosis: No Excoriation: No Ecchymosis: No Induration: No Erythema: No Rash: No Hemosiderin Staining: No Scarring: No Mottled: No Pallor: No Moisture Rubor: No No Abnormalities Noted:  No Dry / Scaly: No Temperature / Pain Maceration: No Temperature: No Abnormality Treatment Notes Wound #3 (Amputation Site - Toe) Wound Laterality: Right Cleanser Vashe 5.8 (oz) Discharge Instruction: Cleanse the wound with Vashe prior to applying a clean dressing using gauze sponges, not tissue or cotton balls. [name], [name] ([id number]) [id number] Page 8 of 10 Peri-Wound Care Topical Skintegrity Hydrogel 4 (oz) Discharge Instruction: Apply hydrogel as directed Primary Dressing Secondary Dressing Woven Gauze Sponges 2x2 in Discharge Instruction: Apply over primary dressing as directed. Secured With Paper Tape, 2x10 (in/yd) Discharge Instruction: Secure dressing with tape as directed. Compression Wrap Compression Stockings Add-Ons Electronic Signature(s) Signed: 02/21/2023 4:32:11 PM By: Fonnie Mu RN Entered By: Fonnie Mu on 02/21/2023 12:34:56 -------------------------------------------------------------------------------- Wound Assessment Details Patient Name: Date of Service: [name] 02/21/2023 3:30 PM Medical Record Number: [id number] Patient Account Number: [id number] Date of Birth/Sex: Treating RN: 27-Aug-1933 (89 y.o. M) Primary Care Jernee Murtaugh: [name] Other Clinician: Referring Latavious Bitter: Treating Katasha Riga/Extender: [name] in Treatment: 33 Wound  Status Wound Number: 4 Primary Diabetic Wound/Ulcer of the Lower Extremity Etiology: Wound Location: Right, Lateral Foot Secondary Pressure Ulcer Wounding Event: Shear/Friction Etiology: Date Acquired: 11/21/2022 Wound Open Weeks Of Treatment: 11 Status: Clustered Wound: No Comorbid Cataracts, Anemia, Asthma, Chronic Obstructive Pulmonary History: Disease (COPD), Peripheral Arterial Disease, Type II Diabetes, Gout, Osteoarthritis, Neuropathy Photos Wound Measurements Length: (cm) 2.9 Width: (cm) 2 Depth: (cm) 0.9 Area: (cm) 4.555 Volume: (cm) 4.1 [name], [name] ([id number]) % Reduction in Area: 12.1% % Reduction in Volume: -691.5% Epithelialization: Small (1-33%) Tunneling: No Undermining: Yes Starting Position (o'clock): 12 [id number] Page 9 of 10 Ending Position (o'clock): 12 Maximum Distance: (cm) 0.5 Wound Description Classification: Grade 2 Wound Margin: Distinct, outline attached Exudate Amount: Medium Exudate Type: Serosanguineous Exudate Color: red, brown Foul Odor After Cleansing: No Slough/Fibrino No Wound Bed Granulation Amount: Large (67-100%) Exposed Structure Granulation Quality: Red, Pink Fascia Exposed: No Necrotic Amount: Small (1-33%) Fat Layer (Subcutaneous Tissue) Exposed: Yes Necrotic Quality: Adherent Slough Tendon Exposed: Yes Muscle Exposed: No Joint Exposed: No Bone Exposed: Yes Periwound Skin Texture Texture Color No Abnormalities Noted: No No Abnormalities Noted: No Callus: Yes Atrophie Blanche: No Crepitus: No Cyanosis: No Excoriation: No Ecchymosis: No Induration: No Erythema: No Rash: No Hemosiderin Staining: No Scarring: No Mottled: No Pallor: No Moisture Rubor: No No Abnormalities Noted: No Dry / Scaly: No Temperature / Pain Maceration: Yes Temperature: No Abnormality Treatment Notes Wound #4 (Foot) Wound Laterality: Right, Lateral Cleanser Soap and Water Discharge Instruction:  May shower and wash wound with dial antibacterial soap and water prior to dressing change. Wound Cleanser Discharge Instruction: Cleanse the wound with wound cleanser prior to applying a clean dressing using gauze sponges, not tissue or cotton balls. Peri-Wound Care Skin Prep Discharge Instruction: Use skin prep as directed Topical Primary Dressing apligraf #3 Discharge Instruction: applied by Brasen Bundren. cutimed sorbact swab with steristrip Discharge Instruction: leave in place. drawtex Discharge Instruction: leave in place. Secondary Dressing ABD Pad, 5x9 Discharge Instruction: Apply over primary dressing as directed. Secured With [employer], 4.5x3.1 (in/yd) Discharge Instruction: Secure with Kerlix as directed. 55M Medipore H Soft Cloth Surgical T ape, 4 x 10 (in/yd) Discharge Instruction: Secure with tape as directed. Compression Wrap Compression Stockings Add-Ons [name], [name] ([id number]) [id number] Page 10 of 10 Electronic Signature(s) Signed: 02/21/2023 4:32:11 PM By: Fonnie Mu RN Entered By: Fonnie Mu on 02/21/2023 12:35:20 -------------------------------------------------------------------------------- Vitals Details Patient Name: Date of Service: [name], [name] 02/21/2023 3:30  PM Medical Record Number: [id number] Patient Account Number: [id number] Date of Birth/Sex: Treating RN: [birth year] (89 y.o. M) Primary Care Juniper Snyders: [name] Other Clinician: Referring Bobby Ragan: Treating Kacen Mellinger/Extender: [name] in Treatment: 33 Vital Signs Time Taken: 14:06 Temperature (F): 98.1 Height (in): 71 Pulse (bpm): 86 Weight (lbs): 170 Respiratory Rate (breaths/min): 18 Body Mass Index (BMI): 23.7 Blood Pressure (mmHg): 149/89 Capillary Blood Glucose (mg/dl): 409 Reference Range: 80 - 120 mg / dl Electronic Signature(s) Signed: 02/21/2023 2:29:11 PM By: Haywood Pao  CHT EMT BS , , Entered By: Haywood Pao on 02/21/2023 11:29:10

## 2023-02-21 NOTE — Progress Notes (Signed)
[name] ([id number]) [id number] Page 1 of 2 Visit Report for 02/21/2023 Arrival Information Details Patient Name: Date of Service: [name] 02/21/2023 11:30 A M Medical Record Number: [id number] Patient Account Number: [id number] Date of Birth/Sex: Treating RN: 07-Mar-1934 (89 y.o. [name] Primary Care Taylia Berber: [name] Other Clinician: Haywood Pao Referring Eryn Marandola: Treating Naphtali Zywicki/Extender: [name] in Treatment: 70 Visit Information History Since Last Visit All ordered tests and consults were completed: Yes Patient Arrived: Ambulatory Added or deleted any medications: No Arrival Time: 11:32 Any new allergies or adverse reactions: No Accompanied By: self Had a fall or experienced change in No Transfer Assistance: None activities of daily living that may affect Patient Identification Verified: Yes risk of falls: Secondary Verification Process Completed: Yes Signs or symptoms of abuse/neglect since last visito No Patient Requires Transmission-Based Precautions: No Hospitalized since last visit: No Patient Has Alerts: Yes Implantable device outside of the clinic excluding No Patient Alerts: Patient on Blood Thinner cellular tissue based products placed in the center since last visit: Pain Present Now: No Electronic Signature(s) Signed: 02/21/2023 2:43:17 PM By: Haywood Pao CHT EMT BS , , Entered By: Haywood Pao on 02/21/2023 11:43:17 -------------------------------------------------------------------------------- Encounter Discharge Information Details Patient Name: Date of Service: [name] 02/21/2023 11:30 A M Medical Record Number: [id number] Patient Account Number: [id number] Date of Birth/Sex: Treating RN: April 01, 1933 (89 y.o. [name] Primary Care [name]: [name] Other Clinician: Haywood Pao Referring  [name]: Treating Dontray Haberland/Extender: [name] in Treatment: 33 Encounter Discharge Information Items Discharge Condition: Stable Ambulatory Status: Ambulatory Discharge Destination: Other (Note Required) Orders Sent: Yes Transportation: Other Accompanied By: self Schedule Follow-up Appointment: No Clinical Summary of Care: Notes Patient has wound care encounter after hyperbaric treatment today. Electronic Signature(s) Signed: 02/21/2023 2:53:34 PM By: Haywood Pao CHT EMT BS , , Entered By: Haywood Pao on 02/21/2023 11:53:33 [name] ([id number]) [id number] Page 2 of 2 -------------------------------------------------------------------------------- Vitals Details Patient Name: Date of Service: [name] 02/21/2023 11:30 A M Medical Record Number: [id number] Patient Account Number: [id number] Date of Birth/Sex: Treating RN: 1934/01/15 (89 y.o. [name] Primary Care [name]: [name] Other Clinician: Haywood Pao Referring [name]: Treating [name]/Extender: [name] in Treatment: 33 Vital Signs Time Taken: 11:42 Temperature (F): 97.7 Height (in): 71 Pulse (bpm): 93 Weight (lbs): 170 Respiratory Rate (breaths/min): 18 Body Mass Index (BMI): 23.7 Blood Pressure (mmHg): 119/63 Capillary Blood Glucose (mg/dl): 355 Reference Range: 80 - 120 mg / dl Electronic Signature(s) Signed: 02/21/2023 2:43:51 PM By: Haywood Pao CHT EMT BS , , Entered By: Haywood Pao on 02/21/2023 11:43:51

## 2023-02-21 NOTE — Progress Notes (Signed)
[name] ([id number]) [id number] Page 1 of 3 Visit Report for 02/21/2023 HBO Details Patient Name: Date of Service: [name] [name] 02/21/2023 11:30 A M Medical Record Number: [id number] Patient Account Number: [id number] Date of Birth/Sex: Treating RN: [birth year] (89 y.o. [name], [name] Primary Care Mikeyla Music: [name] Other Clinician: Haywood Pao Referring Lenny Bouchillon: Treating Haileyann Staiger/Extender: [name] in Treatment: 33 HBO Treatment Course Details Treatment Course Number: 1 Ordering Ahsha Hinsley: [name] T Treatments Ordered: otal 40 HBO Treatment Start Date: 01/09/2023 HBO Indication: Diabetic Ulcer(s) of the Lower Extremity Standard/Conservative Wound Care tried and failed greater than or equal to 30 days Wound #3 Right Amputation Site - T oe HBO Treatment Details Treatment Number: 27 Patient Type: Outpatient Chamber Type: Monoplace Chamber Serial #: [id number] Treatment Protocol: 2.0 ATA with 90 minutes oxygen, and no air breaks Treatment Details Compression Rate Down: 1.0 psi / minute De-Compression Rate Up: 1.5 psi / minute Air breaks and breathing Decompress Decompress Compress Tx Pressure Begins Reached periods Begins Ends (leave unused spaces blank) Chamber Pressure (ATA 1 2 ------2 1 ) Clock Time (24 hr) 12:04 12:23 - - - - - - 13:53 14:03 Treatment Length: 119 (minutes) Treatment Segments: 4 Vital Signs Capillary Blood Glucose Reference Range: 80 - 120 mg / dl HBO Diabetic Blood Glucose Intervention Range: <131 mg/dl or >782 mg/dl Type: Time Vitals Blood Respiratory Capillary Blood Glucose Pulse Action Pulse: Temperature: Taken: Pressure: Rate: Glucose (mg/dl): Meter #: Oximetry (%) Taken: Pre 11:42 119/63 93 18 97.7 171 none per protocol Post 14:06 149/89 86 18 98.1 209 none per protocol Treatment Response Treatment Toleration: Well Treatment Completion Status:  Treatment Completed without Adverse Event Treatment Notes Mr. Krippner arrived and prepared for treatment. His vital signs were within normal range. After performing a safety check, patient was placed in the chamber which was compressed with 100% oxygen at a rate of 1 psi/min at ventilation rate of 450 L/min, effectively reducing actual travel rate to approximately 0.74 psi/min. Speed was slowly increased at increments of 5 psig by decreasing the ventilation rate to ultimate level of 250 L/min. Mr. Inselman tolerated the treatment and subsequent decompression at a rate of 1.5 psi/min. Patient denied any issues with ear equalization and/or pain associated with barotrauma. His post-treatment vital signs were normal. He was stable upon discharge. Additional Procedure Documentation Tissue Sevierity: Necrosis of bone Graceann Boileau Notes No concerns with treatment given Physician HBO Attestation: I certify that I supervised this HBO treatment in accordance with Medicare Waheed, Royer ([id number]) [id number] Page 2 of 3 guidelines. A trained emergency response team is readily available per Yes hospital policies and procedures. Continue HBOT as ordered. Yes Electronic Signature(s) Signed: 02/21/2023 4:32:13 PM By: [name] MD Previous Signature: 02/21/2023 2:51:39 PM Version By: Haywood Pao CHT EMT BS , , Entered By: [name] on 02/21/2023 13:31:31 -------------------------------------------------------------------------------- HBO Safety Checklist Details Patient Name: Date of Service: [name] [name] 02/21/2023 11:30 A M Medical Record Number: [id number] Patient Account Number: [id number] Date of Birth/Sex: Treating RN: [birth year] (89 y.o. [name] Primary Care Anastazja Isaac: [name] Other Clinician: Haywood Pao Referring Yahsir Wickens: Treating Esmirna Ravan/Extender: [name] in Treatment: 33 HBO  Safety Checklist Items Safety Checklist Consent Form Signed Patient voided / foley secured and emptied When did you last eato 1030 Steak biscuit x 2 Last dose of injectable or oral agent 0900 -Jardiance Ostomy pouch emptied and vented if applicable NA All implantable devices assessed, documented and approved  NA Intravenous access site secured and place NA Valuables secured Linens and cotton and cotton/polyester blend (less than 51% polyester) Personal oil-based products / skin lotions / body lotions removed Wigs or hairpieces removed NA Smoking or tobacco materials removed NA Books / newspapers / magazines / loose paper removed Cologne, aftershave, perfume and deodorant removed Jewelry removed (may wrap wedding band) Make-up removed Hair care products removed Battery operated devices (external) removed Heating patches and chemical warmers removed Titanium eyewear removed Nail polish cured greater than 10 hours NA Casting material cured greater than 10 hours NA Hearing aids removed removed Loose dentures or partials removed dentures removed Prosthetics have been removed NA Patient demonstrates correct use of air break device (if applicable) Patient concerns have been addressed Patient grounding bracelet on and cord attached to chamber Specifics for Inpatients (complete in addition to above) Medication sheet sent with patient NA Intravenous medications needed or due during therapy sent with patient NA Drainage tubes (e.g. nasogastric tube or chest tube secured and vented) NA Endotracheal or Tracheotomy tube secured NA Cuff deflated of air and inflated with saline NA Airway suctioned NA Notes Paper version used prior to treatment start. Electronic Signature(s) Signed: 02/21/2023 2:45:08 PM By: Haywood Pao CHT EMT BS , , [place], Jaylee [phone]: Haywood Pao CHT EMT BS [phone].pdf Page 3 of 3 Signed: 02/21/2023 2:45:08 PM  , , Entered By: Haywood Pao on 02/21/2023 11:45:07

## 2023-02-22 ENCOUNTER — Encounter (HOSPITAL_BASED_OUTPATIENT_CLINIC_OR_DEPARTMENT_OTHER): Payer: PPO | Admitting: Internal Medicine

## 2023-02-22 DIAGNOSIS — E11621 Type 2 diabetes mellitus with foot ulcer: Secondary | ICD-10-CM | POA: Diagnosis not present

## 2023-02-22 LAB — GLUCOSE, CAPILLARY
Glucose-Capillary: 201 mg/dL — ABNORMAL HIGH (ref 70–99)
Glucose-Capillary: 138 mg/dL — ABNORMAL HIGH (ref 70–99)

## 2023-02-22 NOTE — Progress Notes (Signed)
VISCOMI, Javione ([id number]) [id number] Page 1 of 1 Visit Report for 02/21/2023 SuperBill Details Patient Name: Date of Service: [name] 02/21/2023 Medical Record Number: [id number] Patient Account Number: [id number] Date of Birth/Sex: Treating RN: April 15, 1933 (89 y.o. 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 E11.621 Type 2 diabetes mellitus with foot ulcer I70.235 Atherosclerosis of native arteries of right leg with ulceration of other part of foot Facility Procedures CPT4 Code Description Modifier Quantity [id number]-(Facility Use Only) HBOT full body chamber, , 4 ICD-10 Diagnosis Description E11.621 Type 2 diabetes mellitus with foot ulcer 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.512 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.235 Atherosclerosis of native arteries of right leg with ulceration of other part of foot Physician Procedures Quantity CPT4 Code Description Modifier [id number] - WC PHYS HYPERBARIC OXYGEN THERAPY 1 ICD-10 Diagnosis Description E11.621 Type 2 diabetes mellitus with foot ulcer 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.512 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.235 Atherosclerosis of native arteries of right leg with ulceration of other part of foot Electronic Signature(s) Signed: 02/21/2023 2:52:06 PM By: Haywood Pao CHT EMT BS , , Signed:  02/21/2023 4:32:13 PM By: [name] MD Entered By: Haywood Pao on 02/21/2023 11:52:05

## 2023-02-23 ENCOUNTER — Encounter (HOSPITAL_BASED_OUTPATIENT_CLINIC_OR_DEPARTMENT_OTHER): Payer: PPO | Admitting: Internal Medicine

## 2023-02-23 DIAGNOSIS — E11621 Type 2 diabetes mellitus with foot ulcer: Secondary | ICD-10-CM | POA: Diagnosis not present

## 2023-02-23 LAB — GLUCOSE, CAPILLARY: Glucose-Capillary: 264 mg/dL — ABNORMAL HIGH (ref 70–99)

## 2023-02-23 NOTE — Progress Notes (Signed)
[name], [name] ([id number]) [id number] Page 1 of 10 Visit Report for 02/21/2023 Cellular or Tissue Based Product Details Patient Name: Date of Service: [name] 02/21/2023 3:30 PM Medical Record Number: [id number] Patient Account Number: [id number] Date of Birth/Sex: Treating RN: March 05, 1934 (89 y.o. M) Primary Care Provider: [name] Other Clinician: Referring Provider: Treating Provider/Extender: [name] in Treatment: 33 Cellular or Tissue Based Product Type Wound #4 Right,Lateral Foot Applied to: Performed By: Physician Maxwell Caul., MD The following information was scribed by: Shawn Stall The information was scribed for: [name] Cellular or Tissue Based Product Type: Apligraf Level of Consciousness (Pre-procedure): Awake and Alert Pre-procedure Verification/Time Out Yes - 15:45 Taken: Location: genitalia / hands / feet / multiple digits Wound Size (sq cm): 5.8 Product Size (sq cm): 44 Waste Size (sq cm): 0 Amount of Product Applied (sq cm): 44 Instrument Used: Forceps, Scissors Lot #: [id number] Order #: 3 Expiration Date: 03/01/2023 Fenestrated: Yes Instrument: Blade Reconstituted: Yes Solution Type: normal saline Solution Amount: 10mL Lot #: [id number] KS Solution Expiration Date: 06/25/2024 Secured: Yes Secured With: Steri-Strips, adaptic Dressing Applied: No Procedural Pain: 0 Post Procedural Pain: 0 Response to Treatment: Procedure was tolerated well Level of Consciousness (Post- Awake and Alert procedure): Post Procedure Diagnosis Same as Pre-procedure Electronic Signature(s) Signed: 02/21/2023 4:32:13 PM By: [name] MD Entered By: [name] on 02/21/2023 16:09:04 -------------------------------------------------------------------------------- HPI Details Patient Name: Date of Service: [name] 02/21/2023 3:30 PM Medical Record  Number: [id number] Patient Account Number: [id number] Date of Birth/Sex: Treating RN: 1933-06-18 (89 y.o. M) Primary Care Provider: [name] Other Clinician: Referring Provider: Treating Provider/Extender: [name] in Treatment: [address] [name] ([id number]) [id number] Page 2 of 10 History of Present Illness HPI Description: 07/04/2022 [name] is an 89 year old male with a past medical history of uncontrolled, insulin dependent type 2 diabetes, CKD stage III, and COPD that presents to the clinic for a right foot wound. He has an extensive history of peripheral arterial disease to the right leg. He has had a right superficial femoral to posterior tibial artery bypass by Dr. Elonda Husky in 2020. He had a right common femoral to plantar posterior tibial artery bypass on 02/03/2022 by Dr. Lenell Antu and at that time he also had a right toe amputation. He states this wound site had healed completely. Prior to this he had amputations to the first and second digit to the right foot. He presents today with an open wound to the previous right toe amputation site for the past 2 months. He has been following with vein and vascular for this issue. Most recent ABIs were on 06/22/2022 that showed absent waveforms in the right lower extremity. Currently his right lower extremity bypass is occluded and no plan for another anteriorgram and has he likely has no revascularization options. Currently he has been using Betadine to the wound bed. He denies signs of infection. 07/11/2022 patient arrives in clinic today using Hydrofera Blue on the toe amputation site. The patient is revascularized and is not felt to have any other options for revascularization. We are using Hydrofera Blue and gauze. 5/13; patient presents for follow-up. He has been using Hydrofera Blue to the toe amputation site. He has had no issues. Wound is smaller. He has  been approved for [facility] and was agreeable to have this placed in office today. 5/20; patient presents for follow-up. We have been using Kerecis to the wound bed. There is been  improvement in healing. Unfortunately patient did not change the outer dressing for a week. There is slight maceration to the periwound. Unclear why he did not do this. 5/30; patient presents for follow-up. We have been using Kerecis to the wound bed. More maceration to the periwound today. No signs of infection. 6/10; patient presents for follow-up. He has been using Hydrofera Blue to the wound bed. He has no issues or complaints today. Maceration has almost completely resolved. 6/18; patient presents for follow-up. He has been using blast X and collagen to the wound bed without issues. Wound appears smaller today. 6/28; patient presents for follow-up. He has been using blast X and collagen to the wound bed. The wound is smaller. 7/11; patient presents for follow-up. He has been using blast X and collagen to the wound bed. Wound is smaller. He has slough accumulation. 7/25; patient presents for follow-up. He has been using blast X and collagen to the wound bed. Wound is stable. He denies signs of infection. 8/26; patient presents for follow-up. He has been using blast X and collagen to the wound bed. Wound is slightly smaller. He has no issues or complaints. 8/29; patient presents for follow-up. He has been using blast X and Aquacel Ag to the wound bed. He has been using a Velcro wrap at his toes to keep the toes closer together to help with wound healing. He has no issues or complaints today. Wound is smaller. 9/12; patient presents for follow-up. He has been using blast X and Aquacel Ag to the wound bed. Unfortunately he has developed a pressure injury to the lateral aspect of the foot. He is not sure how this started. No open wound there. He denies pain. 9/23; this patient has a wound on the right foot at the amputation  site of her third toe. It is right up against the fourth toe making it somewhat difficult to dress. When he was here last time he had a blister/DTI on the lateral aspect of the fifth MTP 9/30; patient has a wound on the right foot amputation site of his third toe. Probably in the third fourth webspace. I changed him to endoform last week. The wound looks somewhat better The area over the lateral part of the fifth metatarsal head which was blistered with some central deep tissue looking injury was leaking fluid today. The blister was unroofed. I used pickups and scissors to remove this. I do not know that there is anything technically open here although there is certainly some central tissue at risk 10/7; his original wound at the amputation site in his third fourth webspace looks stable to improved. We have been using endoform. The real concern today is the area over the lateral part of the fifth metatarsal head. This initially looked like a blister with central deep tissue injury I unroofed the blister last week there was nothing technically open but the deep tissue injury was concerning. T oday he comes in with a necrotic surface I remove this. The central part of this area which I am assuming was the deep tissue injury is necrotic I removed some of the necrotic tissue with a #15 scalpel and pickups. There is undermining around the wound edge here. 10/14; his original wound at the amputation site of his third fourth toe webspace looks improved we have gradually been making progress here. Using endoform. Unfortunately the area over the lateral part of the right fifth metatarsal head continues to deteriorate. Necrotic surface last week continues this week.  The PCR culture I did last week did not manage to make it to the right lab. I have looked at his x-ray the bone underneath does not look that bad to me however we have not yet had a radiologist interpretation. We have been using Iodoflex this  week. 10/21; the original amputation site of his third fourth toe webspace continues to look reasonably healthy and making slow but steady progress we have been using endoform The area on the right lateral fifth metatarsal head has been deteriorating problem. PCR culture grew MRSA. I have him on doxycycline. There is exposed bone in the wound under necrotic material. I still do not have the x-ray back from October 7 at least in terms of the report. Addendum 12/27/2022; I have looked back on this patient's vascular status. He is status post a right common femoral to posterior tibial artery bypass in 2020. He had a right common femoral to plantar posterior tibial bypass by Dr. Lenell Antu in November 2023. He also had a right third toe amputation at the same time. The wound that we have been dealing with the third toe amputation site was present after the surgery. He was last seen by vascular surgery on 06/21/2022 and was supposed to follow-up with Dr. Lenell Antu in 3 weeks although I do not see that appointment. I have included the major findings in the last right lower extremity angiogram on 01/21/2022 10/28;Marland Kitchen The x-ray report that I ordered several weeks ago is back. Indeed there was nothing under the fifth met head on the right however notable for bone necrosis under the wound on the left third toe. This suggest osteomyelitis. I am going to extend his doxycycline for 3 further weeks. PCR culture on the lateral foot grew MRSA I sent a secure text message to Dr. Juanetta Gosling. His reply was that there was no additional options for revascularization. The patient went to see Dr. Juanetta Gosling and that is the message they came up with. He did offer him an amputation [o Above-knee]. We have been using endoform to the third toe amputation site. Silver alginate to the new wound over the fifth metatarsal head 11/4; patient continues on doxycycline which she is handling well. He started hyperbaric oxygen today and tolerated the  first treatment well. We have been using endoform to the third toe amputation site which was the original wound he came to the clinic for. We are using Santyl and silver alginate to the wound [name], [name] ([id number]) [id number] Page 3 of 10 over the fifth metatarsal head which was the wound that opened while he was under observation. Culture grew MRSA and the wound over the fifth metatarsal head 11/11 continuing doxycycline. He is tolerating hyperbarics although I am continually notified about tachycardia in the 100-110 range. Looking back on EKGs shows this is a longstanding problem sinus tachycardia. He also has left anterior fascicular block. He does not describe chest pain The original wound on the third toe amputation site looks a lot better this is contracting however the area on the right lateral fifth metatarsal head only has a rim of height of granulation tissue around what looks to be periosteum 11/18; continuing doxycycline. He is tolerating hyperbarics. The original wound on the third toe amputation site is very small and healthy however the wound the developed on the right lateral fifth met head which probably was a combination of infection and ischemic has a nonviable surface. We have been using Hydrofera Blue and Santyl he continues on doxycycline. The doxycycline  was same that the x-ray from 10/31 showing high-grade phalangeal bone erosion suspicious for osteomyelitis. At that time there was nothing demonstrated on the fifth met head. I have repeated the x-ray of the foot looking specifically at the fifth met head however the report is not available. 12/3; the patient's original wound on the amputation site of his third toe is closed overall though it is a slight surface eschar I did not remove this. The problematic area on the right lateral fifth metatarsal head remains the major issue here. He is not a candidate for further revascularization. He  has what looks to be some viable granulation over the bone which is an improvement. However extending into the superior part of his foot a large open area over the bone itself. We used Apligraf #1 today. He continues with HBO 12/10; the original wound on the amputation site is eschared over. There is probably a small open area remaining here but I did not debride this. The original osteomyelitis was in this area. The subsequent wound is over the right lateral fifth metatarsal head. We applied Apligraf #1 last week. The wound has improved. The option for this patient was amputation at some level. He thinks he was told that this would be above-knee certainly complicating to decide in that direction. Fortunately his wound looks somewhat better today. No obvious infection 12/17; his original wound on the amputation site of his third toe is still eschared over but I have not disturb this. The original osteomyelitis that we are treating him for was in this area. The subsequent wound is over the right lateral fifth met head. This was a combination of ischemic and infected wound that developed rapidly from 1 week to the next in this clinic. Electronic Signature(s) Signed: 02/21/2023 4:32:13 PM By: [name] MD Entered By: [name] on 02/21/2023 16:11:06 -------------------------------------------------------------------------------- Physical Exam Details Patient Name: Date of Service: [name] 02/21/2023 3:30 PM Medical Record Number: [id number] Patient Account Number: [id number] Date of Birth/Sex: Treating RN: 1933/12/29 (89 y.o. M) Primary Care Provider: [name] Other Clinician: Referring Provider: Treating Provider/Extender: [name] in Treatment: 64 Constitutional Patient is hypertensive.. Pulse regular and within target range for patient.Marland Kitchen Respirations regular, non-labored and within target range.. Temperature is normal and within  the target range for the patient.Marland Kitchen Appears in no distress. Notes Wound exam; third metatarsal head is a small amount of eschar on the surface this is unchanged from last week and I did not probe this further. The problem areas on the right fifth metatarsal head laterally. It now has some surface granulation undermines fairly liberally however. Our intake nurse noted some odor but there does not appear to be infection. We use an Apligraf #3 placing it in the undermining areas as best I could and then layering over the top Electronic Signature(s) Signed: 02/21/2023 4:32:13 PM By: [name] MD Entered By: [name] on 02/21/2023 16:12:16 -------------------------------------------------------------------------------- Physician Orders Details Patient Name: Date of Service: [name] 02/21/2023 3:30 PM Medical Record Number: [id number] Patient Account Number: [id number] Napier, [name] ([id number]) [id number] Page 4 of 10 Date of Birth/Sex: Treating RN: 08-24-1933 (89 y.o. [name] Primary Care Provider: [name] Other Clinician: Referring Provider: Treating Provider/Extender: [name] in Treatment: 72 The following information was scribed by: Shawn Stall The information was scribed for: [name] Verbal / Phone Orders: No Diagnosis Coding Follow-up Appointments ppointment in 1 week. - Dr. Leanord Hawking 02/27/2023  315pm Return A ppointment in 2 weeks. - Dr. Lady Gary covering for Dr. Leanord Hawking Dr. Mikey Bussing 03/06/23 or 03/07/23 Monday or Tuesday Return A Return appointment in 3 weeks. - ****Dr.Hoffman *** March 14, 2023 Tuesday (front office to schedule) Other: - [employer] Anesthetic (In clinic) Topical Lidocaine 5% applied to wound bed Cellular or Tissue Based Products Wound #4 Right,Lateral Foot Cellular or Tissue Based Product Type: - apligraf-applied #1 02/08/2023 apligraf-applied  #2 02/14/23 Apligraf- applied #3 02/21/2023 Bathing/ Shower/ Hygiene May shower with protection but do not get wound dressing(s) wet. Protect dressing(s) with water repellant cover (for example, large plastic bag) or a cast cover and may then take shower. Off-Loading Open toe surgical shoe to: - right foot. wear while walking and standing. minimize pressure and walking/ standing. Hyperbaric Oxygen Therapy Wound #3 Right Amputation Site - Toe Evaluate for HBO Therapy Indication: - Wagner Grade 3 If appropriate for treatment, begin HBOT per protocol: 2.0 ATA for 90 Minutes without A Breaks ir Total Number of Treatments: - 30 One treatments per day (delivered Monday through Friday unless otherwise specified in Special Instructions below): Finger stick Blood Glucose Pre- and Post- HBOT Treatment. Follow Hyperbaric Oxygen Glycemia Protocol Give two 4oz orange juices in addition to Glucerna when the glycemic protocol is used. A frin (Oxymetazoline HCL) 0.05% nasal spray - 1 spray in both nostrils daily as needed prior to HBO treatment for difficulty clearing ears Wound Treatment Wound #3 - Amputation Site - Toe Wound Laterality: Right Cleanser: Vashe 5.8 (oz) Every Other Day/30 Days Discharge Instructions: Cleanse the wound with Vashe prior to applying a clean dressing using gauze sponges, not tissue or cotton balls. Topical: Skintegrity Hydrogel 4 (oz) Every Other Day/30 Days Discharge Instructions: Apply hydrogel as directed Secondary Dressing: Woven Gauze Sponges 2x2 in Every Other Day/30 Days Discharge Instructions: Apply over primary dressing as directed. Secured With: Paper Tape, 2x10 (in/yd) Every Other Day/30 Days Discharge Instructions: Secure dressing with tape as directed. Wound #4 - Foot Wound Laterality: Right, Lateral Cleanser: Soap and Water 1 x Per Day/30 Days Discharge Instructions: May shower and wash wound with dial antibacterial soap and water prior to dressing  change. Cleanser: Wound Cleanser 1 x Per Day/30 Days Discharge Instructions: Cleanse the wound with wound cleanser prior to applying a clean dressing using gauze sponges, not tissue or cotton balls. Peri-Wound Care: Skin Prep 1 x Per Day/30 Days Discharge Instructions: Use skin prep as directed Prim Dressing: apligraf #3 1 x Per Day/30 Days ary Discharge Instructions: applied by provider. Prim Dressing: cutimed sorbact swab with steristrip 1 x Per Day/30 Days ary Discharge Instructions: leave in place. Prim Dressing: drawtex 1 x Per Day/30 Days ary Discharge Instructions: leave in place. [name], [name] ([id number]) [id number] Page 5 of 10 Secondary Dressing: ABD Pad, 5x9 1 x Per Day/30 Days Discharge Instructions: Apply over primary dressing as directed. Secured With: [employer], 4.5x3.1 (in/yd) 1 x Per Day/30 Days Discharge Instructions: Secure with Kerlix as directed. Secured With: 37M Medipore H Soft Cloth Surgical T ape, 4 x 10 (in/yd) 1 x Per Day/30 Days Discharge Instructions: Secure with tape as directed. GLYCEMIA INTERVENTIONS PROTOCOL PRE-HBO GLYCEMIA INTERVENTIONS ACTION INTERVENTION Obtain pre-HBO capillary blood glucose (ensure 1 physician order is in chart). A. Notify HBO physician and await physician orders. 2 If result is 70 mg/dl or below: B. If the result meets the hospital definition of a critical result, follow hospital policy. A. Give patient an 8 ounce Glucerna Shake, an 8 ounce Ensure,  or 8 ounces of a Glucerna/Ensure equivalent dietary supplement*. B. Wait 30 minutes. If result is 71 mg/dl to 846 mg/dl: C. Retest patients capillary blood glucose (CBG). D. If result greater than or equal to 110 mg/dl, proceed with HBO. If result less than 110 mg/dl, notify HBO physician and consider holding HBO. If result is 131 mg/dl to 962 mg/dl: A. Proceed with HBO. A. Notify HBO physician and await physician orders. B. It  is recommended to hold HBO and do If result is 250 mg/dl or greater: blood/urine ketone testing. C. If the result meets the hospital definition of a critical result, follow hospital policy. POST-HBO GLYCEMIA INTERVENTIONS ACTION INTERVENTION Obtain post HBO capillary blood glucose (ensure 1 physician order is in chart). A. Notify HBO physician and await physician orders. 2 If result is 70 mg/dl or below: B. If the result meets the hospital definition of a critical result, follow hospital policy. A. Give patient an 8 ounce Glucerna Shake, an 8 ounce Ensure, or 8 ounces of a Glucerna/Ensure equivalent dietary supplement*. B. Wait 15 minutes for symptoms of If result is 71 mg/dl to 952 mg/dl: hypoglycemia (i.e. nervousness, anxiety, sweating, chills, clamminess, irritability, confusion, tachycardia or dizziness). C. If patient asymptomatic, discharge patient. If patient symptomatic, repeat capillary blood glucose (CBG) and notify HBO physician. If result is 101 mg/dl to 841 mg/dl: A. Discharge patient. A. Notify HBO physician and await physician orders. B. It is recommended to do blood/urine ketone If result is 250 mg/dl or greater: testing. C. If the result meets the hospital definition of a critical result, follow hospital policy. *Juice or candies are NOT equivalent products. If patient refuses the Glucerna or Ensure, please consult the hospital dietitian for an appropriate substitute. Electronic Signature(s) Signed: 02/21/2023 4:32:13 PM By: [name] MD Signed: 02/22/2023 5:35:56 PM By: Shawn Stall RN, BSN Entered By: Shawn Stall on 02/21/2023 16:02:30 Problem List Details -------------------------------------------------------------------------------- [name] ([id number]) [id number] Page 6 of 10 Patient Name: Date of Service: [name] 02/21/2023 3:30 PM Medical Record Number: [id number] Patient Account  Number: [id number] Date of Birth/Sex: Treating RN: 1933/08/30 (89 y.o. M) Primary Care Provider: [name] Other Clinician: Referring Provider: Treating Provider/Extender: [name] in Treatment: 33 Active Problems ICD-10 Encounter Code Description Active Date MDM Diagnosis L97.512 Non-pressure chronic ulcer of other part of right foot with fat layer exposed 07/04/2022 No Yes L97.514 Non-pressure chronic ulcer of other part of right foot with necrosis of bone 12/26/2022 No Yes E11.621 Type 2 diabetes mellitus with foot ulcer 07/04/2022 No Yes I70.235 Atherosclerosis of native arteries of right leg with ulceration of other part of 07/04/2022 No Yes foot Inactive Problems Resolved Problems Electronic Signature(s) Signed: 02/21/2023 4:32:13 PM By: [name] MD Entered By: [name] on 02/21/2023 16:08:44 -------------------------------------------------------------------------------- Progress Note Details Patient Name: Date of Service: [name] 02/21/2023 3:30 PM Medical Record Number: [id number] Patient Account Number: [id number] Date of Birth/Sex: Treating RN: 10/13/1933 (89 y.o. M) Primary Care Provider: [name] Other Clinician: Referring Provider: Treating Provider/Extender: [name] in Treatment: 33 Subjective History of Present Illness (HPI) 07/04/2022 Mr. Demarqus Goon is an 89 year old male with a past medical history of uncontrolled, insulin dependent type 2 diabetes, CKD stage III, and COPD that presents to the clinic for a right foot wound. He has an extensive history of peripheral arterial disease to the right leg. He has had a right superficial femoral to posterior tibial artery bypass by Dr.  Goldman in 2020. He had a right common femoral to plantar posterior tibial artery bypass on 02/03/2022 by Dr. Lenell Antu and at that time he also had a right toe amputation. He states this  wound site had healed completely. Prior to this he had amputations to the first and second digit to the right foot. He presents today with an open wound to the previous right toe amputation site for the past 2 months. He has been following with vein and vascular for this issue. Most recent ABIs were on 06/22/2022 that showed absent waveforms in the right lower extremity. Currently his right lower extremity bypass is occluded and no plan for another anteriorgram and has he likely has no revascularization options. Currently he has been using Betadine to the wound bed. He denies signs of infection. 07/11/2022 patient arrives in clinic today using Hydrofera Blue on the toe amputation site. The patient is revascularized and is not felt to have any other options for revascularization. We are using Hydrofera Blue and gauze. 5/13; patient presents for follow-up. He has been using Hydrofera Blue to the toe amputation site. He has had no issues. Wound is smaller. He has been approved for [facility] and was agreeable to have this placed in office today. 5/20; patient presents for follow-up. We have been using Kerecis to the wound bed. There is been improvement in healing. Unfortunately patient did not change the outer dressing for a week. There is slight maceration to the periwound. Unclear why he did not do this. [name], [name] ([id number]) [id number] Page 7 of 10 5/30; patient presents for follow-up. We have been using Kerecis to the wound bed. More maceration to the periwound today. No signs of infection. 6/10; patient presents for follow-up. He has been using Hydrofera Blue to the wound bed. He has no issues or complaints today. Maceration has almost completely resolved. 6/18; patient presents for follow-up. He has been using blast X and collagen to the wound bed without issues. Wound appears smaller today. 6/28; patient presents for follow-up. He has been using blast X and  collagen to the wound bed. The wound is smaller. 7/11; patient presents for follow-up. He has been using blast X and collagen to the wound bed. Wound is smaller. He has slough accumulation. 7/25; patient presents for follow-up. He has been using blast X and collagen to the wound bed. Wound is stable. He denies signs of infection. 8/26; patient presents for follow-up. He has been using blast X and collagen to the wound bed. Wound is slightly smaller. He has no issues or complaints. 8/29; patient presents for follow-up. He has been using blast X and Aquacel Ag to the wound bed. He has been using a Velcro wrap at his toes to keep the toes closer together to help with wound healing. He has no issues or complaints today. Wound is smaller. 9/12; patient presents for follow-up. He has been using blast X and Aquacel Ag to the wound bed. Unfortunately he has developed a pressure injury to the lateral aspect of the foot. He is not sure how this started. No open wound there. He denies pain. 9/23; this patient has a wound on the right foot at the amputation site of her third toe. It is right up against the fourth toe making it somewhat difficult to dress. When he was here last time he had a blister/DTI on the lateral aspect of the fifth MTP 9/30; patient has a wound on the right foot amputation site of his third toe. Probably  in the third fourth webspace. I changed him to endoform last week. The wound looks somewhat better The area over the lateral part of the fifth metatarsal head which was blistered with some central deep tissue looking injury was leaking fluid today. The blister was unroofed. I used pickups and scissors to remove this. I do not know that there is anything technically open here although there is certainly some central tissue at risk 10/7; his original wound at the amputation site in his third fourth webspace looks stable to improved. We have been using endoform. The real concern today is the  area over the lateral part of the fifth metatarsal head. This initially looked like a blister with central deep tissue injury I unroofed the blister last week there was nothing technically open but the deep tissue injury was concerning. T oday he comes in with a necrotic surface I remove this. The central part of this area which I am assuming was the deep tissue injury is necrotic I removed some of the necrotic tissue with a #15 scalpel and pickups. There is undermining around the wound edge here. 10/14; his original wound at the amputation site of his third fourth toe webspace looks improved we have gradually been making progress here. Using endoform. Unfortunately the area over the lateral part of the right fifth metatarsal head continues to deteriorate. Necrotic surface last week continues this week. The PCR culture I did last week did not manage to make it to the right lab. I have looked at his x-ray the bone underneath does not look that bad to me however we have not yet had a radiologist interpretation. We have been using Iodoflex this week. 10/21; the original amputation site of his third fourth toe webspace continues to look reasonably healthy and making slow but steady progress we have been using endoform The area on the right lateral fifth metatarsal head has been deteriorating problem. PCR culture grew MRSA. I have him on doxycycline. There is exposed bone in the wound under necrotic material. I still do not have the x-ray back from October 7 at least in terms of the report. Addendum 12/27/2022; I have looked back on this patient's vascular status. He is status post a right common femoral to posterior tibial artery bypass in 2020. He had a right common femoral to plantar posterior tibial bypass by Dr. Lenell Antu in November 2023. He also had a right third toe amputation at the same time. The wound that we have been dealing with the third toe amputation site was present after the surgery. He was  last seen by vascular surgery on 06/21/2022 and was supposed to follow-up with Dr. Lenell Antu in 3 weeks although I do not see that appointment. I have included the major findings in the last right lower extremity angiogram on 01/21/2022 10/28;Marland Kitchen The x-ray report that I ordered several weeks ago is back. Indeed there was nothing under the fifth met head on the right however notable for bone necrosis under the wound on the left third toe. This suggest osteomyelitis. I am going to extend his doxycycline for 3 further weeks. PCR culture on the lateral foot grew MRSA I sent a secure text message to Dr. Juanetta Gosling. His reply was that there was no additional options for revascularization. The patient went to see Dr. Juanetta Gosling and that is the message they came up with. He did offer him an amputation [o Above-knee]. We have been using endoform to the third toe amputation site. Silver alginate to the  new wound over the fifth metatarsal head 11/4; patient continues on doxycycline which she is handling well. He started hyperbaric oxygen today and tolerated the first treatment well. We have been using endoform to the third toe amputation site which was the original wound he came to the clinic for. We are using Santyl and silver alginate to the wound over the fifth metatarsal head which was the wound that opened while he was under observation. Culture grew MRSA and the wound over the fifth metatarsal head 11/11 continuing doxycycline. He is tolerating hyperbarics although I am continually notified about tachycardia in the 100-110 range. Looking back on EKGs shows this is a longstanding problem sinus tachycardia. He also has left anterior fascicular block. He does not describe chest pain The original wound on the third toe amputation site looks a lot better this is contracting however the area on the right lateral fifth metatarsal head only has a rim of height of granulation tissue around what looks to be periosteum 11/18;  continuing doxycycline. He is tolerating hyperbarics. The original wound on the third toe amputation site is very small and healthy however the wound the developed on the right lateral fifth met head which probably was a combination of infection and ischemic has a nonviable surface. We have been using Hydrofera Blue and Santyl he continues on doxycycline. The doxycycline was same that the x-ray from 10/31 showing high-grade phalangeal bone erosion suspicious for osteomyelitis. At that time there was nothing demonstrated on the fifth met head. I have repeated the x-ray of the foot looking specifically at the fifth met head however the report is not available. 12/3; the patient's original wound on the amputation site of his third toe is closed overall though it is a slight surface eschar I did not remove this. The problematic area on the right lateral fifth metatarsal head remains the major issue here. He is not a candidate for further revascularization. He has what looks to be some viable granulation over the bone which is an improvement. However extending into the superior part of his foot a large open area over the bone itself. We used Apligraf #1 today. He continues with HBO 12/10; the original wound on the amputation site is eschared over. There is probably a small open area remaining here but I did not debride this. The original [place], [name] ([id number]) [id number] Page 8 of 10 osteomyelitis was in this area. The subsequent wound is over the right lateral fifth metatarsal head. We applied Apligraf #1 last week. The wound has improved. The option for this patient was amputation at some level. He thinks he was told that this would be above-knee certainly complicating to decide in that direction. Fortunately his wound looks somewhat better today. No obvious infection 12/17; his original wound on the amputation site of his third toe is still eschared over but I have  not disturb this. The original osteomyelitis that we are treating him for was in this area. The subsequent wound is over the right lateral fifth met head. This was a combination of ischemic and infected wound that developed rapidly from 1 week to the next in this clinic. Objective Constitutional Patient is hypertensive.. Pulse regular and within target range for patient.Marland Kitchen Respirations regular, non-labored and within target range.. Temperature is normal and within the target range for the patient.Marland Kitchen Appears in no distress. Vitals Time Taken: 2:06 PM, Height: 71 in, Weight: 170 lbs, BMI: 23.7, Temperature: 98.1 F, Pulse: 86 bpm, Respiratory Rate: 18 breaths/min, Blood Pressure:  149/89 mmHg, Capillary Blood Glucose: 171 mg/dl. General Notes: Wound exam; third metatarsal head is a small amount of eschar on the surface this is unchanged from last week and I did not probe this further. The problem areas on the right fifth metatarsal head laterally. It now has some surface granulation undermines fairly liberally however. Our intake nurse noted some odor but there does not appear to be infection. We use an Apligraf #3 placing it in the undermining areas as best I could and then layering over the top Integumentary (Hair, Skin) Wound #3 status is Open. Original cause of wound was Gradually Appeared. The date acquired was: 04/07/2022. The wound has been in treatment 33 weeks. The wound is located on the Right Amputation Site - T The wound measures 0.1cm length x 0.1cm width x 0.1cm depth; 0.008cm^2 area and 0.001cm^3 volume. oe. There is Fat Layer (Subcutaneous Tissue) exposed. There is a small amount of serosanguineous drainage noted. The wound margin is epibole. There is no granulation within the wound bed. There is a large (67-100%) amount of necrotic tissue within the wound bed including Eschar. The periwound skin appearance exhibited: Callus. The periwound skin appearance did not exhibit: Crepitus,  Excoriation, Induration, Rash, Scarring, Dry/Scaly, Maceration, Atrophie Blanche, Cyanosis, Ecchymosis, Hemosiderin Staining, Mottled, Pallor, Rubor, Erythema. Periwound temperature was noted as No Abnormality. Wound #4 status is Open. Original cause of wound was Shear/Friction. The date acquired was: 11/21/2022. The wound has been in treatment 11 weeks. The wound is located on the Right,Lateral Foot. The wound measures 2.9cm length x 2cm width x 0.9cm depth; 4.555cm^2 area and 4.1cm^3 volume. There is bone, tendon, and Fat Layer (Subcutaneous Tissue) exposed. There is no tunneling noted, however, there is undermining starting at 12:00 and ending at 12:00 with a maximum distance of 0.5cm. There is a medium amount of serosanguineous drainage noted. The wound margin is distinct with the outline attached to the wound base. There is large (67-100%) red, pink granulation within the wound bed. There is a small (1-33%) amount of necrotic tissue within the wound bed including Adherent Slough. The periwound skin appearance exhibited: Callus, Maceration. The periwound skin appearance did not exhibit: Crepitus, Excoriation, Induration, Rash, Scarring, Dry/Scaly, Atrophie Blanche, Cyanosis, Ecchymosis, Hemosiderin Staining, Mottled, Pallor, Rubor, Erythema. Periwound temperature was noted as No Abnormality. 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 A skin graft procedure using a bioengineered skin substitute/cellular or tissue based product was performed by Maxwell Caul., MD with the following  instrument(s): Forceps and Scissors. Apligraf was applied and secured with Steri-Strips and adaptic. 44 sq cm of product was utilized and 0 sq cm was wasted. Post Application, no dressing was applied. A Time Out was conducted at 15:45, prior to the start of the procedure. The procedure was tolerated well with a pain level of 0 throughout and a pain level of 0 following the procedure. Post procedure Diagnosis Wound #4: Same as Pre-Procedure . Plan Follow-up Appointments: Return Appointment in 1 week. - Dr. Leanord Hawking 02/27/2023 315pm [name], [name] ([id number]) [id number] Page 9 of 10 Return Appointment in 2 weeks. - Dr. Lady Gary covering for Dr. Leanord Hawking  Dr. Mikey Bussing 03/06/23 or 03/07/23 Monday or Tuesday Return appointment in 3 weeks. - ****Dr.Hoffman *** March 14, 2023 Tuesday (front office to schedule) Other: - [employer] Anesthetic: (In clinic) Topical Lidocaine 5% applied to wound bed Cellular or Tissue Based Products: Wound #4 Right,Lateral Foot: Cellular or Tissue Based Product Type: - apligraf-applied #1 02/08/2023 apligraf-applied #2 02/14/23 Apligraf- applied #3 02/21/2023 Bathing/ Shower/ Hygiene: May shower with protection but do not get wound dressing(s) wet. Protect dressing(s) with water repellant cover (for example, large plastic bag) or a cast cover and may then take shower. Off-Loading: Open toe surgical shoe to: - right foot. wear while walking and standing. minimize pressure and walking/ standing. Hyperbaric Oxygen Therapy: Wound #3 Right Amputation Site - T oe: Evaluate for HBO Therapy Indication: - Wagner Grade 3 If appropriate for treatment, begin HBOT per protocol: 2.0 ATA for 90 Minutes without Air Breaks T Number of Treatments: - 30 otal One treatments per day (delivered Monday through Friday unless otherwise specified in Special Instructions below): Finger stick Blood Glucose Pre- and Post- HBOT Treatment. Follow Hyperbaric  Oxygen Glycemia Protocol Give two 4oz orange juices in addition to Glucerna when the glycemic protocol is used. Afrin (Oxymetazoline HCL) 0.05% nasal spray - 1 spray in both nostrils daily as needed prior to HBO treatment for difficulty clearing ears WOUND #3: - Amputation Site - T oe Wound Laterality: Right Cleanser: Vashe 5.8 (oz) Every Other Day/30 Days Discharge Instructions: Cleanse the wound with Vashe prior to applying a clean dressing using gauze sponges, not tissue or cotton balls. Topical: Skintegrity Hydrogel 4 (oz) Every Other Day/30 Days Discharge Instructions: Apply hydrogel as directed Secondary Dressing: Woven Gauze Sponges 2x2 in Every Other Day/30 Days Discharge Instructions: Apply over primary dressing as directed. Secured With: Paper T ape, 2x10 (in/yd) Every Other Day/30 Days Discharge Instructions: Secure dressing with tape as directed. WOUND #4: - Foot Wound Laterality: Right, Lateral Cleanser: Soap and Water 1 x Per Day/30 Days Discharge Instructions: May shower and wash wound with dial antibacterial soap and water prior to dressing change. Cleanser: Wound Cleanser 1 x Per Day/30 Days Discharge Instructions: Cleanse the wound with wound cleanser prior to applying a clean dressing using gauze sponges, not tissue or cotton balls. Peri-Wound Care: Skin Prep 1 x Per Day/30 Days Discharge Instructions: Use skin prep as directed Prim Dressing: apligraf #3 1 x Per Day/30 Days ary Discharge Instructions: applied by provider. Prim Dressing: cutimed sorbact swab with steristrip 1 x Per Day/30 Days ary Discharge Instructions: leave in place. Prim Dressing: drawtex 1 x Per Day/30 Days ary Discharge Instructions: leave in place. Secondary Dressing: ABD Pad, 5x9 1 x Per Day/30 Days Discharge Instructions: Apply over primary dressing as directed. Secured With: [employer], 4.5x3.1 (in/yd) 1 x Per Day/30 Days Discharge Instructions: Secure with Kerlix as  directed. Secured With: 36M Medipore H Soft Cloth Surgical T ape, 4 x 10 (in/yd) 1 x Per Day/30 Days Discharge Instructions: Secure with tape as directed. 1. Apligraf #3 2. Unfortunately has severe concomitant PAD which is not amenable to further revascularization. This is already been verified with Dr. [facility]. He will complete HBO on Friday. Electronic Signature(s) Signed: 02/21/2023 4:32:13 PM By: [name] MD Entered By: [name] on 02/21/2023 16:13:16 -------------------------------------------------------------------------------- SuperBill Details Patient Name: Date of Service: [name] 02/21/2023 Medical Record Number: [id number] Patient Account Number: [id number] Date of Birth/Sex: Treating RN: 12/04/1933 (89 y.o. [name] Primary Care  Provider: [name] Other Clinician: Referring Provider: Treating Provider/Extender: [name] in Treatment: [address] Diagnosis [address] [name] ([id number]) [id number] Page 10 of 10 ICD-10 Codes Code Description [phone] Non-pressure chronic ulcer of other part of right foot with fat layer exposed L97.514 Non-pressure chronic ulcer of other part of right foot with necrosis of bone E11.621 Type 2 diabetes mellitus with foot ulcer I70.235 Atherosclerosis of native arteries of right leg with ulceration of other part of foot Facility Procedures : CPT4 Code: [id number] Description: (Facility Use Only) Apligraf 1 SQ CM Modifier: Quantity: 44 : CPT4 Code: 62130865 Description: 15275 - SKIN SUB GRAFT FACE/NK/HF/G ICD-10 Diagnosis Description L97.514 Non-pressure chronic ulcer of other part of right foot with necrosis of bone E11.621 Type 2 diabetes mellitus with foot ulcer Modifier: Quantity: 1 Physician Procedures : CPT4 Code Description Modifier [id number] - WC PHYS SKIN SUB GRAFT FACE/NK/HF/G ICD-10 Diagnosis Description  L97.514 Non-pressure chronic ulcer of other part of right foot with necrosis of bone E11.621 Type 2 diabetes mellitus with foot ulcer Quantity: 1 Electronic Signature(s) Signed: 02/21/2023 4:32:13 PM By: [name] MD Entered By: [name] on 02/21/2023 16:13:27

## 2023-02-23 NOTE — Progress Notes (Addendum)
AARON, Isley ([id number]) [id number] Page 1 of 3 Visit Report for 02/22/2023 HBO Details Patient Name: Date of Service: [name] 02/22/2023 11:30 A M Medical Record Number: [id number] Patient Account Number: [id number] Date of Birth/Sex: Treating RN: 1933-04-25 (89 y.o. [name] Primary Care Errick Salts: [name] Other Clinician: Haywood Pao Referring Vasco Chong: Treating Jearldine Cassady/Extender: [name] in Treatment: 33 HBO Treatment Course Details Treatment Course Number: 1 Ordering Genine Beckett: [name] T Treatments Ordered: otal 40 HBO Treatment Start Date: 01/09/2023 HBO Indication: Diabetic Ulcer(s) of the Lower Extremity Standard/Conservative Wound Care tried and failed greater than or equal to 30 days Wound #3 Right Amputation Site - T oe HBO Treatment Details Treatment Number: 28 Patient Type: Outpatient Chamber Type: Monoplace Chamber Serial #: [id number] Treatment Protocol: 2.0 ATA with 90 minutes oxygen, and no air breaks Treatment Details Compression Rate Down: 1.5 psi / minute De-Compression Rate Up: 2.0 psi / minute Air breaks and breathing Decompress Decompress Compress Tx Pressure Begins Reached periods Begins Ends (leave unused spaces blank) Chamber Pressure (ATA 1 2 ------2 1 ) Clock Time (24 hr) 11:56 12:12 - - - - - - 13:42 13:54 Treatment Length: 118 (minutes) Treatment Segments: 4 Vital Signs Capillary Blood Glucose Reference Range: 80 - 120 mg / dl HBO Diabetic Blood Glucose Intervention Range: <131 mg/dl or >244 mg/dl Type: Time Vitals Blood Respiratory Capillary Blood Glucose Pulse Action Pulse: Temperature: Taken: Pressure: Rate: Glucose (mg/dl): Meter #: Oximetry (%) Taken: Pre 11:34 131/71 94 18 97.8 201 none per protocol Post 13:57 137/86 79 18 98.1 138 none per protocol Treatment Response Treatment Toleration: Well Treatment Completion  Status: Treatment Completed without Adverse Event Treatment Notes [name] arrived with vital signs that were within normal range. Patient prepared for treatment. After performing a safety check, patient was placed in the chamber which was compressed with 100% oxygen at a rate of 1.5 psi/min after confirming normal ear equalization. [name] tolerated the treatment and subsequent decompression at a rate of 1.5 psi/min. He denied any issues with ear equalization and/or pain associated with barotrauma. His post-treatment vital signs were normal. He was stable upon discharge. Additional Procedure Documentation Tissue Sevierity: Necrosis of bone Zi Sek Notes No concern with treatment given Physician HBO Attestation: I certify that I supervised this HBO treatment in accordance with Medicare guidelines. A trained emergency response team is readily available per Yes KUHNLE, Nichole ([id number]) [id number] Page 2 of 3 hospital policies and procedures. Continue HBOT as ordered. Yes Electronic Signature(s) Signed: 02/23/2023 12:11:14 PM By: [name] MD Previous Signature: 02/22/2023 4:36:25 PM Version By: Haywood Pao CHT EMT BS , , Entered By: [name] on 02/23/2023 12:10:33 -------------------------------------------------------------------------------- HBO Safety Checklist Details Patient Name: Date of Service: [name] 02/22/2023 11:30 A M Medical Record Number: [id number] Patient Account Number: [id number] Date of Birth/Sex: Treating RN: 12-04-1933 (89 y.o. [name] Primary Care Yazmyn Valbuena: [name] Other Clinician: Haywood Pao Referring Jaidy Cottam: Treating Hassen Bruun/Extender: [name] in Treatment: 33 HBO Safety Checklist Items Safety Checklist Consent Form Signed Patient voided / foley secured and emptied When did you last eato 0830 [name] Toast, Jelly Last  dose of injectable or oral agent 0800 Jardiance Ostomy pouch emptied and vented if applicable NA All implantable devices assessed, documented and approved NA Intravenous access site secured and place NA Valuables secured NA Linens and cotton and cotton/polyester blend (less than 51% polyester) Personal oil-based products / skin lotions / body  lotions removed Wigs or hairpieces removed NA Smoking or tobacco materials removed NA Books / newspapers / magazines / loose paper removed Cologne, aftershave, perfume and deodorant removed Jewelry removed (may wrap wedding band) Make-up removed NA Hair care products removed Battery operated devices (external) removed Heating patches and chemical warmers removed Titanium eyewear removed Nail polish cured greater than 10 hours NA Casting material cured greater than 10 hours NA Hearing aids removed removed Loose dentures or partials removed dentures removed Prosthetics have been removed NA Patient demonstrates correct use of air break device (if applicable) Patient concerns have been addressed Patient grounding bracelet on and cord attached to chamber Specifics for Inpatients (complete in addition to above) Medication sheet sent with patient NA Intravenous medications needed or due during therapy sent with patient NA Drainage tubes (e.g. nasogastric tube or chest tube secured and vented) NA Endotracheal or Tracheotomy tube secured NA Cuff deflated of air and inflated with saline NA Airway suctioned NA Notes Paper version used prior to treatment start. Electronic Signature(s) Signed: 02/22/2023 4:33:31 PM By: Haywood Pao CHT EMT BS , , [place], [name] ([id number]) [id number] Page 3 of 3 Entered By: Haywood Pao on 02/22/2023 16:33:30

## 2023-02-23 NOTE — Progress Notes (Signed)
SERMERSHEIM, Jeziel ([id number]) [id number] Page 1 of 2 Visit Report for 02/22/2023 Arrival Information Details Patient Name: Date of Service: [name] 02/22/2023 11:30 A M Medical Record Number: [id number] Patient Account Number: [id number] Date of Birth/Sex: Treating RN: 1934-02-11 (89 y.o. [name] Primary Care Taneia Mealor: [name] Other Clinician: Haywood Pao Referring Yarlin Breisch: Treating Yaira Bernardi/Extender: [name] in Treatment: 67 Visit Information History Since Last Visit All ordered tests and consults were completed: Yes Patient Arrived: Ambulatory Added or deleted any medications: No Arrival Time: 11:30 Any new allergies or adverse reactions: No Accompanied By: self Had a fall or experienced change in No Transfer Assistance: None activities of daily living that may affect Patient Identification Verified: Yes risk of falls: Secondary Verification Process Completed: Yes Signs or symptoms of abuse/neglect since last visito No Patient Requires Transmission-Based Precautions: No Hospitalized since last visit: No Patient Has Alerts: Yes Implantable device outside of the clinic excluding No Patient Alerts: Patient on Blood Thinner cellular tissue based products placed in the center since last visit: Pain Present Now: No Electronic Signature(s) Signed: 02/22/2023 4:15:18 PM By: Haywood Pao CHT EMT BS , , Entered By: Haywood Pao on 02/22/2023 16:15:18 -------------------------------------------------------------------------------- Encounter Discharge Information Details Patient Name: Date of Service: [name] 02/22/2023 11:30 A M Medical Record Number: [id number] Patient Account Number: [id number] Date of Birth/Sex: Treating RN: Jan 11, 1934 (89 y.o. [name] Primary Care Sylvia Helms: [name] Other Clinician: Haywood Pao Referring  Diamante Rubin: Treating Masayoshi Couzens/Extender: [name] in Treatment: 33 Encounter Discharge Information Items Discharge Condition: Stable Ambulatory Status: Ambulatory Discharge Destination: Home Transportation: Private Auto Accompanied By: self Schedule Follow-up Appointment: No Clinical Summary of Care: Electronic Signature(s) Signed: 02/22/2023 4:40:18 PM By: Haywood Pao CHT EMT BS , , Entered By: Haywood Pao on 02/22/2023 16:40:18 Hurlbutt, Jonnathan ([id number]) [id number] Page 2 of 2 -------------------------------------------------------------------------------- Vitals Details Patient Name: Date of Service: [name] 02/22/2023 11:30 A M Medical Record Number: [id number] Patient Account Number: [id number] Date of Birth/Sex: Treating RN: [birth year] (89 y.o. [name] Primary Care Danitra Payano: [name] Other Clinician: Haywood Pao Referring Elvin Mccartin: Treating Chelli Yerkes/Extender: [name] in Treatment: 33 Vital Signs Time Taken: 11:34 Temperature (F): 97.8 Height (in): 71 Pulse (bpm): 94 Weight (lbs): 170 Respiratory Rate (breaths/min): 18 Body Mass Index (BMI): 23.7 Blood Pressure (mmHg): 131/71 Capillary Blood Glucose (mg/dl): 557 Reference Range: 80 - 120 mg / dl Electronic Signature(s) Signed: 02/22/2023 4:15:38 PM By: Haywood Pao CHT EMT BS , , Entered By: Haywood Pao on 02/22/2023 16:15:38

## 2023-02-24 ENCOUNTER — Encounter (HOSPITAL_BASED_OUTPATIENT_CLINIC_OR_DEPARTMENT_OTHER): Payer: PPO | Admitting: General Surgery

## 2023-02-24 DIAGNOSIS — E11621 Type 2 diabetes mellitus with foot ulcer: Secondary | ICD-10-CM | POA: Diagnosis not present

## 2023-02-24 LAB — GLUCOSE, CAPILLARY
Glucose-Capillary: 164 mg/dL — ABNORMAL HIGH (ref 70–99)
Glucose-Capillary: 108 mg/dL — ABNORMAL HIGH (ref 70–99)
Glucose-Capillary: 153 mg/dL — ABNORMAL HIGH (ref 70–99)
Glucose-Capillary: 194 mg/dL — ABNORMAL HIGH (ref 70–99)

## 2023-02-24 NOTE — Progress Notes (Addendum)
PACIS, Porter ([id number]) [id number] Page 1 of 3 Visit Report for 02/23/2023 Arrival Information Details Patient Name: Date of Service: [name] 02/23/2023 11:30 A M Medical Record Number: [id number] Patient Account Number: [id number] Date of Birth/Sex: Treating RN: December 01, 1933 (89 y.o. [name] Primary Care Stellarose Cerny: [name] Other Clinician: Haywood Pao Referring Eligh Rybacki: Treating Emmilyn Crooke/Extender: [name] in Treatment: 85 Visit Information History Since Last Visit All ordered tests and consults were completed: Yes Patient Arrived: Ambulatory Added or deleted any medications: No Arrival Time: 10:55 Any new allergies or adverse reactions: No Accompanied By: self Had a fall or experienced change in No Transfer Assistance: None activities of daily living that may affect Patient Identification Verified: Yes risk of falls: Secondary Verification Process Completed: Yes Signs or symptoms of abuse/neglect since last visito No Patient Requires Transmission-Based Precautions: No Hospitalized since last visit: No Patient Has Alerts: Yes Implantable device outside of the clinic excluding No Patient Alerts: Patient on Blood Thinner cellular tissue based products placed in the center since last visit: Pain Present Now: Yes Electronic Signature(s) Signed: 02/23/2023 3:40:44 PM By: Haywood Pao CHT EMT BS , , Previous Signature: 02/23/2023 3:35:07 PM Version By: Haywood Pao CHT EMT BS , , Previous Signature: 02/23/2023 2:31:18 PM Version By: Haywood Pao CHT EMT BS , , Entered By: Haywood Pao on 02/23/2023 15:40:44 -------------------------------------------------------------------------------- Encounter Discharge Information Details Patient Name: Date of Service: [name] 02/23/2023 11:30 A M Medical Record Number: [id number] Patient Account Number:  [id number] Date of Birth/Sex: Treating RN: [birth year] (89 y.o. [name] Primary Care Jessyka Austria: [name] Other Clinician: Haywood Pao Referring Ijeoma Loor: Treating Moxon Messler/Extender: [name] in Treatment: 33 Encounter Discharge Information Items Discharge Condition: Stable Ambulatory Status: Ambulatory Discharge Destination: Home Transportation: Private Auto Accompanied By: daughter Schedule Follow-up Appointment: No Clinical Summary of Care: Electronic Signature(s) Signed: 02/23/2023 3:42:53 PM By: Haywood Pao CHT EMT BS , , Entered By: Haywood Pao on 02/23/2023 15:42:52 Devincent, Everton ([id number]) [id number] Page 2 of 3 -------------------------------------------------------------------------------- Pain Assessment Details Patient Name: Date of Service: [name] 02/23/2023 11:30 A M Medical Record Number: [id number] Patient Account Number: [id number] Date of Birth/Sex: Treating RN: 10-29-1933 (89 y.o. [name] Primary Care Regino Fournet: [name] Other Clinician: Haywood Pao Referring Shanera Meske: Treating Finesse Fielder/Extender: [name] in Treatment: 33 Active Problems Location of Pain Severity and Description of Pain Patient Has Paino Yes Site Locations Pain Location: Generalized Pain With Dressing Change: Yes Rate the pain. Current Pain Level: 2 Pain Management and Medication Current Pain Management: Electronic Signature(s) Signed: 02/23/2023 3:41:21 PM By: Haywood Pao CHT EMT BS , , Signed: 02/23/2023 6:09:01 PM By: Shawn Stall RN, BSN Entered By: Haywood Pao on 02/23/2023 15:41:20 -------------------------------------------------------------------------------- Vitals Details Patient Name: Date of Service: [name] 02/23/2023 11:30 A M Medical Record Number: [id number] Patient Account  Number: [id number] Date of Birth/Sex: Treating RN: [birth year] (89 y.o. [name] Primary Care Lambros Cerro: [name] Other Clinician: Haywood Pao Referring Nyzir Dubois: Treating Kilee Hedding/Extender: [name] in Treatment: 33 Vital Signs Time Taken: 11:02 Temperature (F): 97.3 Height (in): 71 Pulse (bpm): 94 Weight (lbs): 170 Respiratory Rate (breaths/min): 18 Body Mass Index (BMI): 23.7 Blood Pressure (mmHg): 149/83 Capillary Blood Glucose (mg/dl): 546 Reference Range: 80 - 120 mg / dl Electronic Signature(s) Mauceri, Giuliano ([id number]) [id number] Page 3 of 3 Signed: 02/23/2023 3:36:06 PM By: Haywood Pao CHT EMT BS , ,  Previous Signature: 02/23/2023 2:32:03 PM Version By: Haywood Pao CHT EMT BS , , Entered By: Haywood Pao on 02/23/2023 15:36:06

## 2023-02-24 NOTE — Progress Notes (Signed)
ZAVALETA, Khary ([id number]) [id number] Page 1 of 1 Visit Report for 02/23/2023 SuperBill Details Patient Name: Date of Service: [name] [name] 02/23/2023 Medical Record Number: [id number] Patient Account Number: [id number] Date of Birth/Sex: Treating RN: [birth year] (89 y.o. 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 E11.621 Type 2 diabetes mellitus with foot ulcer I70.235 Atherosclerosis of native arteries of right leg with ulceration of other part of foot Facility Procedures CPT4 Code Description Modifier Quantity [id number] G0277-(Facility Use Only) HBOT full body chamber, , 4 ICD-10 Diagnosis Description E11.621 Type 2 diabetes mellitus with foot ulcer 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.512 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.235 Atherosclerosis of native arteries of right leg with ulceration of other part of foot Physician Procedures Quantity CPT4 Code Description Modifier [id number] - WC PHYS HYPERBARIC OXYGEN THERAPY 1 ICD-10 Diagnosis Description E11.621 Type 2 diabetes mellitus with foot ulcer 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.512 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

## 2023-02-24 NOTE — Progress Notes (Addendum)
[name], [name] ([id number]) [id number] Page 1 of 3 Visit Report for 02/23/2023 HBO Details Patient Name: Date of Service: [name] [name] 02/23/2023 11:30 A M Medical Record Number: [id number] Patient Account Number: [id number] Date of Birth/Sex: Treating RN: 1934/01/12 (89 y.o. [name], [name] Primary Care Pyper Olexa: [name] Other Clinician: Haywood Pao Referring Lakisa Lotz: Treating Corry Storie/Extender: [name] in Treatment: 33 HBO Treatment Course Details Treatment Course Number: 1 Ordering Brady Plant: [name] T Treatments Ordered: otal 30 HBO Treatment Start Date: 01/09/2023 HBO Indication: Diabetic Ulcer(s) of the Lower Extremity Standard/Conservative Wound Care tried and failed greater than or equal to 30 days HBO Treatment Details Treatment Number: 29 Patient Type: Outpatient Chamber Type: Monoplace Chamber Serial #: [id number] Treatment Protocol: 2.0 ATA with 90 minutes oxygen, and no air breaks Treatment Details Compression Rate Down: 1.0 psi / minute De-Compression Rate Up: 1.5 psi / minute Air breaks and breathing Decompress Decompress Compress Tx Pressure Begins Reached periods Begins Ends (leave unused spaces blank) Chamber Pressure (ATA 1 2 ------2 1 ) Clock Time (24 hr) 11:33 11:48 - - - - - - 13:18 13:44 Treatment Length: 131 (minutes) Treatment Segments: 4 Vital Signs Capillary Blood Glucose Reference Range: 80 - 120 mg / dl HBO Diabetic Blood Glucose Intervention Range: <131 mg/dl or >952 mg/dl Type: Time Vitals Blood Respiratory Capillary Blood Glucose Pulse Action Pulse: Temperature: Taken: Pressure: Rate: Glucose (mg/dl): Meter #: Oximetry (%) Taken: Pre 11:02 149/83 94 18 97.3 264 none per protocol Post 13:44 131/82 80 18 97.3 164 none per protocol Treatment Response Treatment Toleration: Well Treatment Completion Status: Treatment Completed without Adverse  Event Treatment Notes Mr. Litfin arrived with vital signs that were within normal range. Patient prepared for treatment. After performing a safety check, patient was placed in the chamber which was compressed with 100% oxygen at a rate of 1.5 psi/min after confirming normal ear equalization. Mr. Tousignant tolerated the treatment and subsequent decompression at a rate of 1.5 psi/min. He denied any issues with ear equalization and/or pain associated with barotrauma. His post-treatment vital signs were normal. Patient's dressing was changed without disturbing skin substitute. He was stable upon discharge. Additional Procedure Documentation Tissue Sevierity: Necrosis of bone Orlan Aversa Notes no concern with rx given Physician HBO Attestation: I certify that I supervised this HBO treatment in accordance with Medicare guidelines. A trained emergency response team is readily available per Yes hospital policies and procedures. [name], [name] ([id number]) [id number] Page 2 of 3 Continue HBOT as ordered. Yes Electronic Signature(s) Signed: 02/23/2023 5:18:32 PM By: [name] MD Previous Signature: 02/23/2023 3:43:46 PM Version By: Haywood Pao CHT EMT BS , , Previous Signature: 02/23/2023 3:39:56 PM Version By: Haywood Pao CHT EMT BS , , Entered By: [name] on 02/23/2023 17:16:31 -------------------------------------------------------------------------------- HBO Safety Checklist Details Patient Name: Date of Service: [name] [name] 02/23/2023 11:30 A M Medical Record Number: [id number] Patient Account Number: [id number] Date of Birth/Sex: Treating RN: [birth year] (89 y.o. [name] Primary Care Dajai Wahlert: [name] Other Clinician: Haywood Pao Referring Keaundre Thelin: Treating Monzerrath Mcburney/Extender: [name] in Treatment: 33 HBO Safety Checklist Items Safety Checklist Consent Form  Signed Patient voided / foley secured and emptied When did you last eato 0800 Abundio Miu, Jelly Last dose of injectable or oral agent 0800 Ostomy pouch emptied and vented if applicable NA All implantable devices assessed, documented and approved NA Intravenous access site secured and place NA Valuables secured Linens and cotton and cotton/polyester blend (less  than 51% polyester) Personal oil-based products / skin lotions / body lotions removed Wigs or hairpieces removed NA Smoking or tobacco materials removed NA Books / newspapers / magazines / loose paper removed Cologne, aftershave, perfume and deodorant removed Jewelry removed (may wrap wedding band) Make-up removed NA Hair care products removed Battery operated devices (external) removed Heating patches and chemical warmers removed Titanium eyewear removed Nail polish cured greater than 10 hours NA Casting material cured greater than 10 hours NA Hearing aids removed removed Loose dentures or partials removed dentures removed Prosthetics have been removed NA Patient demonstrates correct use of air break device (if applicable) Patient concerns have been addressed Patient grounding bracelet on and cord attached to chamber Specifics for Inpatients (complete in addition to above) Medication sheet sent with patient NA Intravenous medications needed or due during therapy sent with patient NA Drainage tubes (e.g. nasogastric tube or chest tube secured and vented) NA Endotracheal or Tracheotomy tube secured NA Cuff deflated of air and inflated with saline NA Airway suctioned NA Notes Paper version used prior to treatment start. Electronic Signature(s) Signed: 02/23/2023 3:37:29 PM By: Haywood Pao CHT EMT BS , , Entered By: Haywood Pao on 02/23/2023 15:37:29 [name], [name] ([id number]) [id number] Page 3 of 3

## 2023-02-24 NOTE — Progress Notes (Signed)
CROPSEY, Sherard ([id number]) [id number] Page 1 of 1 Visit Report for 02/22/2023 SuperBill Details Patient Name: Date of Service: [name] [name] 02/22/2023 Medical Record Number: [id number] Patient Account Number: [id number] Date of Birth/Sex: Treating RN: October 12, 1933 (89 y.o. 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 E11.621 Type 2 diabetes mellitus with foot ulcer I70.235 Atherosclerosis of native arteries of right leg with ulceration of other part of foot Facility Procedures CPT4 Code Description Modifier Quantity [id number] G0277-(Facility Use Only) HBOT full body chamber, , 4 ICD-10 Diagnosis Description E11.621 Type 2 diabetes mellitus with foot ulcer 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.512 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.235 Atherosclerosis of native arteries of right leg with ulceration of other part of foot Physician Procedures Quantity CPT4 Code Description Modifier [id number] 99183 - WC PHYS HYPERBARIC OXYGEN THERAPY 1 ICD-10 Diagnosis Description E11.621 Type 2 diabetes mellitus with foot ulcer 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.512 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.235 Atherosclerosis of native arteries of right leg with ulceration of other part of foot Electronic Signature(s) Signed: 02/22/2023 4:38:24 PM By: Haywood Pao CHT EMT BS , , Signed:  02/23/2023 12:11:14 PM By: [name] Najjar MD Entered By: Haywood Pao on 02/22/2023 16:38:23

## 2023-02-25 NOTE — Progress Notes (Signed)
STROSNIDER, Nylan ([id number]) [id number] Page 1 of 2 Visit Report for 02/24/2023 Arrival Information Details Patient Name: Date of Service: [name] 02/24/2023 9:30 A M Medical Record Number: [id number] Patient Account Number: [id number] Date of Birth/Sex: Treating RN: 09-29-1933 (89 y.o. [name] Primary Care Brinnley Lacap: [name] Other Clinician: Haywood Pao Referring Kendle Turbin: Treating Edris Friedt/Extender: Tonye Pearson in Treatment: 8 Visit Information History Since Last Visit All ordered tests and consults were completed: Yes Patient Arrived: Ambulatory Added or deleted any medications: No Arrival Time: 08:07 Any new allergies or adverse reactions: No Accompanied By: self Had a fall or experienced change in No Transfer Assistance: None activities of daily living that may affect Patient Identification Verified: Yes risk of falls: Secondary Verification Process Completed: Yes Signs or symptoms of abuse/neglect since last visito No Patient Requires Transmission-Based Precautions: No Hospitalized since last visit: No Patient Has Alerts: Yes Implantable device outside of the clinic excluding No Patient Alerts: Patient on Blood Thinner cellular tissue based products placed in the center since last visit: Pain Present Now: No Electronic Signature(s) Signed: 02/24/2023 1:02:26 PM By: Haywood Pao CHT EMT BS , , Entered By: Haywood Pao on 02/24/2023 13:02:26 -------------------------------------------------------------------------------- Encounter Discharge Information Details Patient Name: Date of Service: [name] 02/24/2023 9:30 A M Medical Record Number: [id number] Patient Account Number: [id number] Date of Birth/Sex: Treating RN: [birth year] (89 y.o. [name] Primary Care Chevelle Coulson: [name] Other Clinician: Haywood Pao Referring  Arline Ketter: Treating Savio Albrecht/Extender: Tonye Pearson in Treatment: 44 Encounter Discharge Information Items Discharge Condition: Stable Ambulatory Status: Ambulatory Discharge Destination: Home Transportation: Private Auto Accompanied By: self Schedule Follow-up Appointment: No Clinical Summary of Care: Electronic Signature(s) Signed: 02/24/2023 1:32:37 PM By: Haywood Pao CHT EMT BS , , Entered By: Haywood Pao on 02/24/2023 13:32:36 [name], [name] ([id number]) [id number] Page 2 of 2 -------------------------------------------------------------------------------- Vitals Details Patient Name: Date of Service: [name] 02/24/2023 9:30 A M Medical Record Number: [id number] Patient Account Number: [id number] Date of Birth/Sex: Treating RN: 27-Dec-1933 (89 y.o. [name] Primary Care Harman Ferrin: [name] Other Clinician: Haywood Pao Referring Quavion Boule: Treating Marigold Mom/Extender: Tonye Pearson in Treatment: 27 Vital Signs Time Taken: 08:15 Temperature (F): 98.2 Height (in): 71 Pulse (bpm): 89 Weight (lbs): 170 Respiratory Rate (breaths/min): 18 Body Mass Index (BMI): 23.7 Blood Pressure (mmHg): 142/82 Capillary Blood Glucose (mg/dl): 616 Reference Range: 80 - 120 mg / dl Electronic Signature(s) Signed: 02/24/2023 1:03:07 PM By: Haywood Pao CHT EMT BS , , Entered By: Haywood Pao on 02/24/2023 13:03:07

## 2023-02-25 NOTE — Progress Notes (Signed)
FAIRBAIRN, Breydan ([id number]) [id number] Page 1 of 2 Visit Report for 02/24/2023 HBO Details Patient Name: Date of Service: [name] [name] 02/24/2023 9:30 A M Medical Record Number: [id number] Patient Account Number: [id number] Date of Birth/Sex: Treating RN: 1934-03-05 (89 y.o. [name] Primary Care Pharoah Goggins: [name] Other Clinician: Haywood Pao Referring Nala Kachel: Treating Fedra Lanter/Extender: Tonye Pearson in Treatment: 33 HBO Treatment Course Details Treatment Course Number: 1 Ordering Josslynn Mentzer: [name] T Treatments Ordered: otal 30 HBO Treatment Start 01/09/2023 Date: HBO Indication: Diabetic Ulcer(s) of the Lower Extremity HBO Treatment End 02/24/2023 Date: Standard/Conservative Wound Care tried and failed greater than or equal to 30 days HBO Discharge Treatment Series Complete; Improved Wound Outcome: Perfusion HBO Treatment Details Treatment Number: 30 Patient Type: Outpatient Chamber Type: Monoplace Chamber Serial #: [id number] Treatment Protocol: 2.0 ATA with 90 minutes oxygen, and no air breaks Treatment Details Compression Rate Down: 1.0 psi / minute De-Compression Rate Up: 1.5 psi / minute Air breaks and breathing Decompress Decompress Compress Tx Pressure Begins Reached periods Begins Ends (leave unused spaces blank) Chamber Pressure (ATA 1 2 ------2 1 ) Clock Time (24 hr) 09:02 09:19 - - - - - - 10:49 11:04 Treatment Length: 122 (minutes) Treatment Segments: 4 Vital Signs Capillary Blood Glucose Reference Range: 80 - 120 mg / dl HBO Diabetic Blood Glucose Intervention Range: <131 mg/dl or >244 mg/dl Type: Time Vitals Blood Pulse: Respiratory Temperature: Capillary Blood Glucose Pulse Action Taken: Pressure: Rate: Glucose (mg/dl): Meter #: Oximetry (%) Taken: Pre 08:15 142/82 89 18 98.2 108 8 oz Glucerna and 4 oz OJ Post 11:07 146/92 99 18 97.3 194 none per  protocol Pre 08:53 153 Cleared for HBOT per protocol Treatment Response Treatment Toleration: Well Treatment Completion Status: Treatment Completed without Adverse Event Treatment Notes Mr. Chromy arrived with vital signs that were within normal range. Patient prepared for treatment. After performing a safety check, patient was placed in the chamber which was compressed with 100% oxygen at a rate of 1.5 psi/min after confirming normal ear equalization. Mr. Losacco tolerated the treatment and subsequent decompression at a rate of 1.5 psi/min. He denied any issues with ear equalization and/or pain associated with barotrauma. His post-treatment vital signs were normal. He was stable upon discharge. Additional Procedure Documentation Tissue Sevierity: Necrosis of bone Physician HBO Attestation: I certify that I supervised this HBO treatment in accordance with Medicare guidelines. A trained emergency response team is readily available per Yes hospital policies and procedures. Continue HBOT as ordered. Aleksey Blackburn, Domenik ([id number]) [id number] Page 2 of 2 Electronic Signature(s) Signed: 02/27/2023 7:47:30 AM By: Duanne Guess MD FACS Previous Signature: 02/24/2023 1:25:30 PM Version By: Haywood Pao CHT EMT BS , , Entered By: Duanne Guess on 02/27/2023 04:47:06 -------------------------------------------------------------------------------- HBO Safety Checklist Details Patient Name: Date of Service: [name] [name] 02/24/2023 9:30 A M Medical Record Number: [id number] Patient Account Number: [id number] Date of Birth/Sex: Treating RN: 08/03/1933 (89 y.o. [name] Primary Care Savannah Morford: [name] Other Clinician: Haywood Pao Referring [name]: Treating Noland Pizano/Extender: Tonye Pearson in Treatment: 33 HBO Safety Checklist Items Safety Checklist Consent Form Signed Patient voided / foley  secured and emptied When did you last eato 0800 Steak Egg Biscuit Last dose of injectable or oral agent 0700 Jardiance Ostomy pouch emptied and vented if applicable NA All implantable devices assessed, documented and approved NA Intravenous access site secured and place NA Valuables secured Linens and cotton and cotton/polyester blend (less than  51% polyester) Personal oil-based products / skin lotions / body lotions removed Wigs or hairpieces removed NA Smoking or tobacco materials removed NA Books / newspapers / magazines / loose paper removed Cologne, aftershave, perfume and deodorant removed Jewelry removed (may wrap wedding band) Make-up removed NA Hair care products removed NA Battery operated devices (external) removed Heating patches and chemical warmers removed Titanium eyewear removed Nail polish cured greater than 10 hours NA Casting material cured greater than 10 hours NA Hearing aids removed removed Loose dentures or partials removed dentures removed Prosthetics have been removed NA Patient demonstrates correct use of air break device (if applicable) Patient concerns have been addressed Patient grounding bracelet on and cord attached to chamber Specifics for Inpatients (complete in addition to above) Medication sheet sent with patient NA Intravenous medications needed or due during therapy sent with patient NA Drainage tubes (e.g. nasogastric tube or chest tube secured and vented) NA Endotracheal or Tracheotomy tube secured NA Cuff deflated of air and inflated with saline NA Airway suctioned NA Notes Paper version used prior to treatment start. Electronic Signature(s) Signed: 02/24/2023 1:05:14 PM By: Haywood Pao CHT EMT BS , , Entered By: Haywood Pao on 02/24/2023 10:05:14

## 2023-02-27 ENCOUNTER — Encounter (HOSPITAL_BASED_OUTPATIENT_CLINIC_OR_DEPARTMENT_OTHER): Payer: PPO | Admitting: Internal Medicine

## 2023-02-27 DIAGNOSIS — E11621 Type 2 diabetes mellitus with foot ulcer: Secondary | ICD-10-CM | POA: Diagnosis not present

## 2023-02-27 NOTE — Progress Notes (Signed)
OKUMURA, Augustin ([id number]) [id number] Page 1 of 1 Visit Report for 02/24/2023 SuperBill Details Patient Name: Date of Service: [name] 02/24/2023 Medical Record Number: [id number] Patient Account Number: [id number] Date of Birth/Sex: Treating RN: April 30, 1933 (89 y.o. 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 E11.621 Type 2 diabetes mellitus with foot ulcer I70.235 Atherosclerosis of native arteries of right leg with ulceration of other part of foot Facility Procedures CPT4 Code Description Modifier Quantity [id number]-(Facility Use Only) HBOT full body chamber, , 4 ICD-10 Diagnosis Description E11.621 Type 2 diabetes mellitus with foot ulcer 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.512 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.235 Atherosclerosis of native arteries of right leg with ulceration of other part of foot Physician Procedures Quantity CPT4 Code Description Modifier [id number] - WC PHYS HYPERBARIC OXYGEN THERAPY 1 ICD-10 Diagnosis Description E11.621 Type 2 diabetes mellitus with foot ulcer 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.512 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.235 Atherosclerosis of native arteries of right leg with ulceration of other part of foot Electronic Signature(s) Signed: 02/24/2023 1:30:26 PM By: Haywood Pao CHT EMT BS , , Signed:  02/27/2023 7:47:30 AM By: Duanne Guess MD FACS Entered By: Haywood Pao on 02/24/2023 10:30:26

## 2023-02-28 ENCOUNTER — Encounter (HOSPITAL_BASED_OUTPATIENT_CLINIC_OR_DEPARTMENT_OTHER): Payer: PPO | Admitting: Internal Medicine

## 2023-02-28 NOTE — Progress Notes (Signed)
[name], [name] ([id number]) [id number] Page 1 of 9 Visit Report for 02/27/2023 Cellular or Tissue Based Product Details Patient Name: Date of Service: [name] [name] 02/27/2023 3:15 PM Medical Record Number: [id number] Patient Account Number: [id number] Date of Birth/Sex: Treating RN: 03/27/1933 (89 y.o. M) Primary Care Provider: [name] Other Clinician: Referring Provider: Treating Provider/Extender: [name] in Treatment: 34 Cellular or Tissue Based Product Type Wound #4 Right,Lateral Foot Applied to: Performed By: Physician Maxwell Caul., MD The following information was scribed by: Shawn Stall The information was scribed for: [name] Cellular or Tissue Based Product Type: Apligraf Level of Consciousness (Pre-procedure): Awake and Alert Pre-procedure Verification/Time Out Yes - 15:35 Taken: Location: genitalia / hands / feet / multiple digits Wound Size (sq cm): 3.6 Product Size (sq cm): 44 Waste Size (sq cm): 0 Amount of Product Applied (sq cm): 44 Instrument Used: Forceps, Scissors Lot #: [id number] Order #: 4 Expiration Date: 03/04/2023 Fenestrated: Yes Instrument: Blade Reconstituted: Yes Solution Type: normal saline Solution Amount: 10mL Lot #: [id number] KS Solution Expiration Date: 06/25/2024 Secured: Yes Secured With: Steri-Strips, cutimed sorbact swab Dressing Applied: No Procedural Pain: 0 Post Procedural Pain: 0 Response to Treatment: Procedure was tolerated well Level of Consciousness (Post- Awake and Alert procedure): Post Procedure Diagnosis Same as Pre-procedure Electronic Signature(s) Signed: 02/28/2023 11:51:18 AM By: [name] MD Entered By: [name] on 02/27/2023 12:48:10 -------------------------------------------------------------------------------- HPI Details Patient Name: Date of Service: [name] [name] 02/27/2023 3:15  PM Medical Record Number: [id number] Patient Account Number: [id number] Date of Birth/Sex: Treating RN: 07-31-1933 (89 y.o. M) Primary Care Provider: [name] Other Clinician: Referring Provider: Treating Provider/Extender: [name] in Treatment: [address] [place] ([id number]) [id number] Page 2 of 9 History of Present Illness HPI Description: 07/04/2022 [name] is an 89 year old male with a past medical history of uncontrolled, insulin dependent type 2 diabetes, CKD stage III, and COPD that presents to the clinic for a right foot wound. He has an extensive history of peripheral arterial disease to the right leg. He has had a right superficial femoral to posterior tibial artery bypass by Dr. Elonda Husky in 2020. He had a right common femoral to plantar posterior tibial artery bypass on 02/03/2022 by Dr. Lenell Antu and at that time he also had a right toe amputation. He states this wound site had healed completely. Prior to this he had amputations to the first and second digit to the right foot. He presents today with an open wound to the previous right toe amputation site for the past 2 months. He has been following with vein and vascular for this issue. Most recent ABIs were on 06/22/2022 that showed absent waveforms in the right lower extremity. Currently his right lower extremity bypass is occluded and no plan for another anteriorgram and has he likely has no revascularization options. Currently he has been using Betadine to the wound bed. He denies signs of infection. 07/11/2022 patient arrives in clinic today using Hydrofera Blue on the toe amputation site. The patient is revascularized and is not felt to have any other options for revascularization. We are using Hydrofera Blue and gauze. 5/13; patient presents for follow-up. He has been using Hydrofera Blue to the toe amputation site. He has had no issues. Wound is  smaller. He has been approved for [facility] and was agreeable to have this placed in office today. 5/20; patient presents for follow-up. We have been using Kerecis to the wound bed. There  is been improvement in healing. Unfortunately patient did not change the outer dressing for a week. There is slight maceration to the periwound. Unclear why he did not do this. 5/30; patient presents for follow-up. We have been using Kerecis to the wound bed. More maceration to the periwound today. No signs of infection. 6/10; patient presents for follow-up. He has been using Hydrofera Blue to the wound bed. He has no issues or complaints today. Maceration has almost completely resolved. 6/18; patient presents for follow-up. He has been using blast X and collagen to the wound bed without issues. Wound appears smaller today. 6/28; patient presents for follow-up. He has been using blast X and collagen to the wound bed. The wound is smaller. 7/11; patient presents for follow-up. He has been using blast X and collagen to the wound bed. Wound is smaller. He has slough accumulation. 7/25; patient presents for follow-up. He has been using blast X and collagen to the wound bed. Wound is stable. He denies signs of infection. 8/26; patient presents for follow-up. He has been using blast X and collagen to the wound bed. Wound is slightly smaller. He has no issues or complaints. 8/29; patient presents for follow-up. He has been using blast X and Aquacel Ag to the wound bed. He has been using a Velcro wrap at his toes to keep the toes closer together to help with wound healing. He has no issues or complaints today. Wound is smaller. 9/12; patient presents for follow-up. He has been using blast X and Aquacel Ag to the wound bed. Unfortunately he has developed a pressure injury to the lateral aspect of the foot. He is not sure how this started. No open wound there. He denies pain. 9/23; this patient has a wound on the right foot at  the amputation site of her third toe. It is right up against the fourth toe making it somewhat difficult to dress. When he was here last time he had a blister/DTI on the lateral aspect of the fifth MTP 9/30; patient has a wound on the right foot amputation site of his third toe. Probably in the third fourth webspace. I changed him to endoform last week. The wound looks somewhat better The area over the lateral part of the fifth metatarsal head which was blistered with some central deep tissue looking injury was leaking fluid today. The blister was unroofed. I used pickups and scissors to remove this. I do not know that there is anything technically open here although there is certainly some central tissue at risk 10/7; his original wound at the amputation site in his third fourth webspace looks stable to improved. We have been using endoform. The real concern today is the area over the lateral part of the fifth metatarsal head. This initially looked like a blister with central deep tissue injury I unroofed the blister last week there was nothing technically open but the deep tissue injury was concerning. T oday he comes in with a necrotic surface I remove this. The central part of this area which I am assuming was the deep tissue injury is necrotic I removed some of the necrotic tissue with a #15 scalpel and pickups. There is undermining around the wound edge here. 10/14; his original wound at the amputation site of his third fourth toe webspace looks improved we have gradually been making progress here. Using endoform. Unfortunately the area over the lateral part of the right fifth metatarsal head continues to deteriorate. Necrotic surface last week continues  this week. The PCR culture I did last week did not manage to make it to the right lab. I have looked at his x-ray the bone underneath does not look that bad to me however we have not yet had a radiologist interpretation. We have been using  Iodoflex this week. 10/21; the original amputation site of his third fourth toe webspace continues to look reasonably healthy and making slow but steady progress we have been using endoform The area on the right lateral fifth metatarsal head has been deteriorating problem. PCR culture grew MRSA. I have him on doxycycline. There is exposed bone in the wound under necrotic material. I still do not have the x-ray back from October 7 at least in terms of the report. Addendum 12/27/2022; I have looked back on this patient's vascular status. He is status post a right common femoral to posterior tibial artery bypass in 2020. He had a right common femoral to plantar posterior tibial bypass by Dr. Lenell Antu in November 2023. He also had a right third toe amputation at the same time. The wound that we have been dealing with the third toe amputation site was present after the surgery. He was last seen by vascular surgery on 06/21/2022 and was supposed to follow-up with Dr. Lenell Antu in 3 weeks although I do not see that appointment. I have included the major findings in the last right lower extremity angiogram on 01/21/2022 10/28;Marland Kitchen The x-ray report that I ordered several weeks ago is back. Indeed there was nothing under the fifth met head on the right however notable for bone necrosis under the wound on the left third toe. This suggest osteomyelitis. I am going to extend his doxycycline for 3 further weeks. PCR culture on the lateral foot grew MRSA I sent a secure text message to Dr. Juanetta Gosling. His reply was that there was no additional options for revascularization. The patient went to see Dr. Juanetta Gosling and that is the message they came up with. He did offer him an amputation [o Above-knee]. We have been using endoform to the third toe amputation site. Silver alginate to the new wound over the fifth metatarsal head 11/4; patient continues on doxycycline which she is handling well. He started hyperbaric oxygen today and  tolerated the first treatment well. We have been using endoform to the third toe amputation site which was the original wound he came to the clinic for. We are using Santyl and silver alginate to the wound Hardin, Mikai ([id number]) [id number] Page 3 of 9 over the fifth metatarsal head which was the wound that opened while he was under observation. Culture grew MRSA and the wound over the fifth metatarsal head 11/11 continuing doxycycline. He is tolerating hyperbarics although I am continually notified about tachycardia in the 100-110 range. Looking back on EKGs shows this is a longstanding problem sinus tachycardia. He also has left anterior fascicular block. He does not describe chest pain The original wound on the third toe amputation site looks a lot better this is contracting however the area on the right lateral fifth metatarsal head only has a rim of height of granulation tissue around what looks to be periosteum 11/18; continuing doxycycline. He is tolerating hyperbarics. The original wound on the third toe amputation site is very small and healthy however the wound the developed on the right lateral fifth met head which probably was a combination of infection and ischemic has a nonviable surface. We have been using Hydrofera Blue and Santyl he continues on doxycycline.  The doxycycline was same that the x-ray from 10/31 showing high-grade phalangeal bone erosion suspicious for osteomyelitis. At that time there was nothing demonstrated on the fifth met head. I have repeated the x-ray of the foot looking specifically at the fifth met head however the report is not available. 12/3; the patient's original wound on the amputation site of his third toe is closed overall though it is a slight surface eschar I did not remove this. The problematic area on the right lateral fifth metatarsal head remains the major issue here. He is not a candidate for further  revascularization. He has what looks to be some viable granulation over the bone which is an improvement. However extending into the superior part of his foot a large open area over the bone itself. We used Apligraf #1 today. He continues with HBO 12/10; the original wound on the amputation site is eschared over. There is probably a small open area remaining here but I did not debride this. The original osteomyelitis was in this area. The subsequent wound is over the right lateral fifth metatarsal head. We applied Apligraf #1 last week. The wound has improved. The option for this patient was amputation at some level. He thinks he was told that this would be above-knee certainly complicating to decide in that direction. Fortunately his wound looks somewhat better today. No obvious infection 12/17; his original wound on the amputation site of his third toe is still eschared over but I have not disturb this. The original osteomyelitis that we are treating him for was in this area. The subsequent wound is over the right lateral fifth met head. This was a combination of ischemic and infected wound that developed rapidly from 1 week to the next in this clinic. 12/23; his original wound on the amputation site of his third toe is healed. This is the area with underlying osteomyelitis that he completed hyperbaric oxygen for on Friday. During the course of his stay however he developed a rapidly progressive wound on the right lateral fifth metatarsal head which was a combination of ischemia probably with coexistent infection. I gave him 6 weeks of doxycycline. We extended HBO. And I have been applying Apligraf to this area [#4 to this area today]. We have managed to get the wound to contract however there is still precious little if any tissue over this bone which in itself does not feel completely viable. He is not a candidate for a ray amputation secondary to known PAD. I verified this with vein and vascular  Dr. Juanetta Gosling. Electronic Signature(s) Signed: 02/28/2023 11:51:18 AM By: [name] MD Entered By: [name] on 02/27/2023 12:53:23 -------------------------------------------------------------------------------- Physical Exam Details Patient Name: Date of Service: [name] [name] 02/27/2023 3:15 PM Medical Record Number: [id number] Patient Account Number: [id number] Date of Birth/Sex: Treating RN: Jul 20, 1933 (89 y.o. M) Primary Care Provider: [name] Other Clinician: Referring Provider: Treating Provider/Extender: [name] in Treatment: 34 Notes Wound exam; right fifth metatarsal head. This is contracted somewhat in the undermining his come in. There is still not very much tissue over the met head itself. The bone itself feels fragile. I applied Apligraf #4 in the standard fashion Electronic Signature(s) Signed: 02/28/2023 11:51:18 AM By: [name] MD Entered By: [name] on 02/27/2023 12:54:09 Physician Orders Details -------------------------------------------------------------------------------- Ellery Plunk ([id number]) [id number] Page 4 of 9 Patient Name: Date of Service: [name] [name] 02/27/2023 3:15 PM Medical Record Number: [id number] Patient Account Number: [id number] Date  of Birth/Sex: Treating RN: [birth year] (89 y.o. [name] Primary Care Provider: [name] Other Clinician: Referring Provider: Treating Provider/Extender: [name] in Treatment: 37 The following information was scribed by: Shawn Stall The information was scribed for: [name] Verbal / Phone Orders: No Diagnosis Coding ICD-10 Coding Code Description L97.512 Non-pressure chronic ulcer of other part of right foot with fat layer exposed L97.514 Non-pressure chronic ulcer of other part of right foot with necrosis of bone E11.621 Type 2 diabetes  mellitus with foot ulcer I70.235 Atherosclerosis of native arteries of right leg with ulceration of other part of foot Follow-up Appointments ppointment in 1 week. - Dr. Lady Gary covering for Dr. Leanord Hawking 03/07/23 Tuesday 3PM Return A ppointment in 2 weeks. - ****Dr.Hoffman *** March 14, 2023 Tuesday 215PM Return A Return appointment in 3 weeks. - ****Dr.Hoffman *** january 2025 (FRONT OFFICE TO SCHEDULE) Return appointment in 1 month. - ****Dr.Hoffman *** january 2025 (FRONT OFFICE TO SCHEDULE) Other: - [employer] Anesthetic (In clinic) Topical Lidocaine 5% applied to wound bed Cellular or Tissue Based Products Wound #4 Right,Lateral Foot Cellular or Tissue Based Product Type: - apligraf-applied #1 02/08/2023 apligraf-applied #2 02/14/23 Apligraf- applied #3 02/21/2023 Apligraf- applied #4 02/27/2023 Bathing/ Shower/ Hygiene May shower with protection but do not get wound dressing(s) wet. Protect dressing(s) with water repellant cover (for example, large plastic bag) or a cast cover and may then take shower. Off-Loading Open toe surgical shoe to: - right foot. wear while walking and standing. minimize pressure and walking/ standing. Wound Treatment Wound #4 - Foot Wound Laterality: Right, Lateral Cleanser: Soap and Water 1 x Per Day/30 Days Discharge Instructions: May shower and wash wound with dial antibacterial soap and water prior to dressing change. Cleanser: Wound Cleanser 1 x Per Day/30 Days Discharge Instructions: Cleanse the wound with wound cleanser prior to applying a clean dressing using gauze sponges, not tissue or cotton balls. Peri-Wound Care: Skin Prep 1 x Per Day/30 Days Discharge Instructions: Use skin prep as directed Prim Dressing: apligraf #4 1 x Per Day/30 Days ary Discharge Instructions: applied by provider. Prim Dressing: cutimed sorbact swab or adaptic with steristrip ary 1 x Per Day/30 Days Discharge Instructions: leave in place. Secondary Dressing:  ABD Pad, 5x9 1 x Per Day/30 Days Discharge Instructions: Apply over primary dressing as directed. Secured With: [employer], 4.5x3.1 (in/yd) 1 x Per Day/30 Days Discharge Instructions: Secure with Kerlix as directed. Secured With: 8M Medipore H Soft Cloth Surgical T ape, 4 x 10 (in/yd) 1 x Per Day/30 Days Discharge Instructions: Secure with tape as directed. Electronic Signature(s) Signed: 02/27/2023 4:50:55 PM By: Shawn Stall RN, BSN Signed: 02/28/2023 11:51:18 AM By: [name] MD Entered By: Shawn Stall on 02/27/2023 12:44:12 Andal, Lavan ([id number]) [id number] Page 5 of 9 -------------------------------------------------------------------------------- Problem List Details Patient Name: Date of Service: [name] [name] 02/27/2023 3:15 PM Medical Record Number: [id number] Patient Account Number: [id number] Date of Birth/Sex: Treating RN: [birth year] (89 y.o. [name] Primary Care Provider: [name] Other Clinician: Referring Provider: Treating Provider/Extender: [name] in Treatment: 34 Active Problems ICD-10 Encounter Code Description Active Date MDM Diagnosis L97.512 Non-pressure chronic ulcer of other part of right foot with fat layer exposed 07/04/2022 No Yes L97.514 Non-pressure chronic ulcer of other part of right foot with necrosis of bone 12/26/2022 No Yes E11.621 Type 2 diabetes mellitus with foot ulcer 07/04/2022 No Yes I70.235 Atherosclerosis of native arteries of right leg with ulceration  of other part of 07/04/2022 No Yes foot Inactive Problems Resolved Problems Electronic Signature(s) Signed: 02/28/2023 11:51:18 AM By: [name] MD Entered By: [name] on 02/27/2023 12:47:53 -------------------------------------------------------------------------------- Progress Note Details Patient Name: Date of Service: [name] [name] 02/27/2023 3:15  PM Medical Record Number: [id number] Patient Account Number: [id number] Date of Birth/Sex: Treating RN: 1933/08/21 (89 y.o. M) Primary Care Provider: [name] Other Clinician: Referring Provider: Treating Provider/Extender: [name] in Treatment: 34 Subjective History of Present Illness (HPI) 07/04/2022 Mr. Dace Bossio is an 89 year old male with a past medical history of uncontrolled, insulin dependent type 2 diabetes, CKD stage III, and COPD that presents to the clinic for a right foot wound. He has an extensive history of peripheral arterial disease to the right leg. He has had a right superficial femoral to posterior tibial artery bypass by Dr. Elonda Husky in 2020. He had a right common femoral to plantar posterior tibial artery bypass on 02/03/2022 by Dr. Lenell Antu and at that time he also had a right toe amputation. He states this wound site had healed completely. Prior to this he had amputations to the first and second digit to the right foot. He presents today with an open wound to the previous right toe amputation site for the past 2 months. He has been following with vein and vascular for this issue. Most recent ABIs were on 06/22/2022 that showed absent waveforms in the right lower extremity. Currently his right lower extremity Styron, Hassan ([id number]) [id number] Page 6 of 9 bypass is occluded and no plan for another anteriorgram and has he likely has no revascularization options. Currently he has been using Betadine to the wound bed. He denies signs of infection. 07/11/2022 patient arrives in clinic today using Hydrofera Blue on the toe amputation site. The patient is revascularized and is not felt to have any other options for revascularization. We are using Hydrofera Blue and gauze. 5/13; patient presents for follow-up. He has been using Hydrofera Blue to the toe amputation site. He has had no issues. Wound is  smaller. He has been approved for [facility] and was agreeable to have this placed in office today. 5/20; patient presents for follow-up. We have been using Kerecis to the wound bed. There is been improvement in healing. Unfortunately patient did not change the outer dressing for a week. There is slight maceration to the periwound. Unclear why he did not do this. 5/30; patient presents for follow-up. We have been using Kerecis to the wound bed. More maceration to the periwound today. No signs of infection. 6/10; patient presents for follow-up. He has been using Hydrofera Blue to the wound bed. He has no issues or complaints today. Maceration has almost completely resolved. 6/18; patient presents for follow-up. He has been using blast X and collagen to the wound bed without issues. Wound appears smaller today. 6/28; patient presents for follow-up. He has been using blast X and collagen to the wound bed. The wound is smaller. 7/11; patient presents for follow-up. He has been using blast X and collagen to the wound bed. Wound is smaller. He has slough accumulation. 7/25; patient presents for follow-up. He has been using blast X and collagen to the wound bed. Wound is stable. He denies signs of infection. 8/26; patient presents for follow-up. He has been using blast X and collagen to the wound bed. Wound is slightly smaller. He has no issues or complaints. 8/29; patient presents for follow-up. He has been using blast X  and Aquacel Ag to the wound bed. He has been using a Velcro wrap at his toes to keep the toes closer together to help with wound healing. He has no issues or complaints today. Wound is smaller. 9/12; patient presents for follow-up. He has been using blast X and Aquacel Ag to the wound bed. Unfortunately he has developed a pressure injury to the lateral aspect of the foot. He is not sure how this started. No open wound there. He denies pain. 9/23; this patient has a wound on the right foot at  the amputation site of her third toe. It is right up against the fourth toe making it somewhat difficult to dress. When he was here last time he had a blister/DTI on the lateral aspect of the fifth MTP 9/30; patient has a wound on the right foot amputation site of his third toe. Probably in the third fourth webspace. I changed him to endoform last week. The wound looks somewhat better The area over the lateral part of the fifth metatarsal head which was blistered with some central deep tissue looking injury was leaking fluid today. The blister was unroofed. I used pickups and scissors to remove this. I do not know that there is anything technically open here although there is certainly some central tissue at risk 10/7; his original wound at the amputation site in his third fourth webspace looks stable to improved. We have been using endoform. The real concern today is the area over the lateral part of the fifth metatarsal head. This initially looked like a blister with central deep tissue injury I unroofed the blister last week there was nothing technically open but the deep tissue injury was concerning. T oday he comes in with a necrotic surface I remove this. The central part of this area which I am assuming was the deep tissue injury is necrotic I removed some of the necrotic tissue with a #15 scalpel and pickups. There is undermining around the wound edge here. 10/14; his original wound at the amputation site of his third fourth toe webspace looks improved we have gradually been making progress here. Using endoform. Unfortunately the area over the lateral part of the right fifth metatarsal head continues to deteriorate. Necrotic surface last week continues this week. The PCR culture I did last week did not manage to make it to the right lab. I have looked at his x-ray the bone underneath does not look that bad to me however we have not yet had a radiologist interpretation. We have been using  Iodoflex this week. 10/21; the original amputation site of his third fourth toe webspace continues to look reasonably healthy and making slow but steady progress we have been using endoform The area on the right lateral fifth metatarsal head has been deteriorating problem. PCR culture grew MRSA. I have him on doxycycline. There is exposed bone in the wound under necrotic material. I still do not have the x-ray back from October 7 at least in terms of the report. Addendum 12/27/2022; I have looked back on this patient's vascular status. He is status post a right common femoral to posterior tibial artery bypass in 2020. He had a right common femoral to plantar posterior tibial bypass by Dr. Lenell Antu in November 2023. He also had a right third toe amputation at the same time. The wound that we have been dealing with the third toe amputation site was present after the surgery. He was last seen by vascular surgery on 06/21/2022 and  was supposed to follow-up with Dr. Lenell Antu in 3 weeks although I do not see that appointment. I have included the major findings in the last right lower extremity angiogram on 01/21/2022 10/28;Marland Kitchen The x-ray report that I ordered several weeks ago is back. Indeed there was nothing under the fifth met head on the right however notable for bone necrosis under the wound on the left third toe. This suggest osteomyelitis. I am going to extend his doxycycline for 3 further weeks. PCR culture on the lateral foot grew MRSA I sent a secure text message to Dr. Juanetta Gosling. His reply was that there was no additional options for revascularization. The patient went to see Dr. Juanetta Gosling and that is the message they came up with. He did offer him an amputation [o Above-knee]. We have been using endoform to the third toe amputation site. Silver alginate to the new wound over the fifth metatarsal head 11/4; patient continues on doxycycline which she is handling well. He started hyperbaric oxygen today and  tolerated the first treatment well. We have been using endoform to the third toe amputation site which was the original wound he came to the clinic for. We are using Santyl and silver alginate to the wound over the fifth metatarsal head which was the wound that opened while he was under observation. Culture grew MRSA and the wound over the fifth metatarsal head 11/11 continuing doxycycline. He is tolerating hyperbarics although I am continually notified about tachycardia in the 100-110 range. Looking back on EKGs shows this is a longstanding problem sinus tachycardia. He also has left anterior fascicular block. He does not describe chest pain The original wound on the third toe amputation site looks a lot better this is contracting however the area on the right lateral fifth metatarsal head only has a rim of height of granulation tissue around what looks to be periosteum 11/18; continuing doxycycline. He is tolerating hyperbarics. JIN, Arrick ([id number]) [id number] Page 7 of 9 The original wound on the third toe amputation site is very small and healthy however the wound the developed on the right lateral fifth met head which probably was a combination of infection and ischemic has a nonviable surface. We have been using Hydrofera Blue and Santyl he continues on doxycycline. The doxycycline was same that the x-ray from 10/31 showing high-grade phalangeal bone erosion suspicious for osteomyelitis. At that time there was nothing demonstrated on the fifth met head. I have repeated the x-ray of the foot looking specifically at the fifth met head however the report is not available. 12/3; the patient's original wound on the amputation site of his third toe is closed overall though it is a slight surface eschar I did not remove this. The problematic area on the right lateral fifth metatarsal head remains the major issue here. He is not a candidate for further  revascularization. He has what looks to be some viable granulation over the bone which is an improvement. However extending into the superior part of his foot a large open area over the bone itself. We used Apligraf #1 today. He continues with HBO 12/10; the original wound on the amputation site is eschared over. There is probably a small open area remaining here but I did not debride this. The original osteomyelitis was in this area. The subsequent wound is over the right lateral fifth metatarsal head. We applied Apligraf #1 last week. The wound has improved. The option for this patient was amputation at some level. He thinks he was  told that this would be above-knee certainly complicating to decide in that direction. Fortunately his wound looks somewhat better today. No obvious infection 12/17; his original wound on the amputation site of his third toe is still eschared over but I have not disturb this. The original osteomyelitis that we are treating him for was in this area. The subsequent wound is over the right lateral fifth met head. This was a combination of ischemic and infected wound that developed rapidly from 1 week to the next in this clinic. 12/23; his original wound on the amputation site of his third toe is healed. This is the area with underlying osteomyelitis that he completed hyperbaric oxygen for on Friday. During the course of his stay however he developed a rapidly progressive wound on the right lateral fifth metatarsal head which was a combination of ischemia probably with coexistent infection. I gave him 6 weeks of doxycycline. We extended HBO. And I have been applying Apligraf to this area [#4 to this area today]. We have managed to get the wound to contract however there is still precious little if any tissue over this bone which in itself does not feel completely viable. He is not a candidate for a ray amputation secondary to known PAD. I verified this with vein and vascular  Dr. Juanetta Gosling. Objective Constitutional Vitals Time Taken: 3:18 PM, Height: 71 in, Weight: 170 lbs, BMI: 23.7, Temperature: 97.8 F, Pulse: 73 bpm, Respiratory Rate: 18 breaths/min, Blood Pressure: 139/71 mmHg. Integumentary (Hair, Skin) Wound #3 status is Healed - Epithelialized. Original cause of wound was Gradually Appeared. The date acquired was: 04/07/2022. The wound has been in treatment 34 weeks. The wound is located on the Right Amputation Site - T The wound measures 0cm length x 0cm width x 0cm depth; 0cm^2 area and oe. 0cm^3 volume. There is Fat Layer (Subcutaneous Tissue) exposed. There is no tunneling or undermining noted. There is a small amount of serosanguineous drainage noted. The wound margin is epibole. There is no granulation within the wound bed. There is a large (67-100%) amount of necrotic tissue within the wound bed including Eschar. The periwound skin appearance exhibited: Callus. The periwound skin appearance did not exhibit: Crepitus, Excoriation, Induration, Rash, Scarring, Dry/Scaly, Maceration, Atrophie Blanche, Cyanosis, Ecchymosis, Hemosiderin Staining, Mottled, Pallor, Rubor, Erythema. Periwound temperature was noted as No Abnormality. Wound #4 status is Open. Original cause of wound was Shear/Friction. The date acquired was: 11/21/2022. The wound has been in treatment 12 weeks. The wound is located on the Right,Lateral Foot. The wound measures 2cm length x 1.8cm width x 0.9cm depth; 2.827cm^2 area and 2.545cm^3 volume. There is bone, tendon, and Fat Layer (Subcutaneous Tissue) exposed. There is undermining starting at 6:00 and ending at 12:00 with a maximum distance of 0.2cm. There is a medium amount of serosanguineous drainage noted. The wound margin is distinct with the outline attached to the wound base. There is medium (34- 66%) red, pink granulation within the wound bed. There is a medium (34-66%) amount of necrotic tissue within the wound bed including Adherent  Slough. The periwound skin appearance exhibited: Callus, Maceration. The periwound skin appearance did not exhibit: Crepitus, Excoriation, Induration, Rash, Scarring, Dry/Scaly, Atrophie Blanche, Cyanosis, Ecchymosis, Hemosiderin Staining, Mottled, Pallor, Rubor, Erythema. Periwound temperature was noted as No Abnormality. 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 A skin graft procedure using a bioengineered skin substitute/cellular or tissue based product was performed by Maxwell Caul., MD with the following instrument(s): Forceps and Scissors. Apligraf was applied and secured with Steri-Strips and cutimed sorbact swab. 44 sq cm of product was utilized and 0 sq cm was wasted. Post Application, no dressing was applied. A Time Out was conducted at 15:35, prior to the start of the procedure. The procedure was tolerated well with a pain level Espina, Granite ([id number]) [id number] Page 8 of 9 of 0 throughout and a pain level of 0 following the procedure. Post procedure Diagnosis Wound #4: Same as Pre-Procedure . Plan Follow-up Appointments: Return Appointment in 1 week. - Dr. Lady Gary covering for Dr. Leanord Hawking 03/07/23 Tuesday 3PM Return Appointment in 2 weeks. - ****Dr.Hoffman *** March 14, 2023 Tuesday 215PM Return appointment in 3 weeks. - ****Dr.Hoffman *** january 2025 (FRONT OFFICE TO SCHEDULE) Return appointment in 1 month. - ****Dr.Hoffman *** january 2025 (FRONT OFFICE TO SCHEDULE) Other: - [employer] Anesthetic: (In clinic) Topical  Lidocaine 5% applied to wound bed Cellular or Tissue Based Products: Wound #4 Right,Lateral Foot: Cellular or Tissue Based Product Type: - apligraf-applied #1 02/08/2023 apligraf-applied #2 02/14/23 Apligraf- applied #3 02/21/2023 Apligraf- applied #4 02/27/2023 Bathing/ Shower/ Hygiene: May shower with protection but do not get wound dressing(s) wet. Protect dressing(s) with water repellant cover (for example, large plastic bag) or a cast cover and may then take shower. Off-Loading: Open toe surgical shoe to: - right foot. wear while walking and standing. minimize pressure and walking/ standing. WOUND #4: - Foot Wound Laterality: Right, Lateral Cleanser: Soap and Water 1 x Per Day/30 Days Discharge Instructions: May shower and wash wound with dial antibacterial soap and water prior to dressing change. Cleanser: Wound Cleanser 1 x Per Day/30 Days Discharge Instructions: Cleanse the wound with wound cleanser prior to applying a clean dressing using gauze sponges, not tissue or cotton balls. Peri-Wound Care: Skin Prep 1 x Per Day/30 Days Discharge Instructions: Use skin prep as directed Prim Dressing: apligraf #4 1 x Per Day/30 Days ary Discharge Instructions: applied by provider. Prim Dressing: cutimed sorbact swab or adaptic with steristrip 1 x Per Day/30 Days ary Discharge Instructions: leave in place. Secondary Dressing: ABD Pad, 5x9 1 x Per Day/30 Days Discharge Instructions: Apply over primary dressing as directed. Secured With: [employer], 4.5x3.1 (in/yd) 1 x Per Day/30 Days Discharge Instructions: Secure with Kerlix as directed. Secured With: 15M Medipore H Soft Cloth Surgical T ape, 4 x 10 (in/yd) 1 x Per Day/30 Days Discharge Instructions: Secure with tape as directed. 1. The wound that he came to this clinic with was on the third toe amputation site this healed. Underlying osteomyelitis here was treated with antibiotics and HBO. 2. He developed a wound infection on the  right fifth metatarsal head culturing MRSA. No doubt a significant significant ischemic component. We managed to get this to contract somewhat and there is less undermining but the chances of totally healing this area are probably not high 3. Applied Apligraf #4 today 4. Per Dr. Juanetta Gosling he is not a candidate for anything but a below-knee amputation should the wound deteriorate #5 he completed hyperbarics on Friday Electronic Signature(s) Signed: 02/28/2023 11:51:18 AM By: [name] MD Entered By: [name] on 02/27/2023  12:55:50 -------------------------------------------------------------------------------- SuperBill Details Patient Name: Date of Service: [name] [name] 02/27/2023 Medical Record Number: [id number] Patient Account Number: [id number] Date of Birth/Sex: Treating RN: 02/23/1934 (89 y.o. [name] Primary Care Provider: [name] Other Clinician: Referring Provider: Treating Provider/Extender: [name] in Treatment: 34 Diagnosis Coding ICD-10 Codes Code Description [phone] Non-pressure chronic ulcer of other part of right foot with fat layer exposed Kean, Gussie ([id number]) [id number] Page 9 of 9 L97.514 Non-pressure chronic ulcer of other part of right foot with necrosis of bone E11.621 Type 2 diabetes mellitus with foot ulcer I70.235 Atherosclerosis of native arteries of right leg with ulceration of other part of foot Facility Procedures : CPT4 Code: [id number] Description: (Facility Use Only) Apligraf 1 SQ CM Modifier: Quantity: 44 : CPT4 Code: [id number] Description: 15275 - SKIN SUB GRAFT FACE/NK/HF/G ICD-10 Diagnosis Description L97.514 Non-pressure chronic ulcer of other part of right foot with necrosis of bone E11.621 Type 2 diabetes mellitus with foot ulcer Modifier: Quantity: 1 Physician Procedures : CPT4 Code Description Modifier [id number] - WC PHYS SKIN SUB  GRAFT FACE/NK/HF/G ICD-10 Diagnosis Description L97.514 Non-pressure chronic ulcer of other part of right foot with necrosis of bone E11.621 Type 2 diabetes mellitus with foot ulcer Quantity: 1 Electronic Signature(s) Signed: 02/28/2023 11:51:18 AM By: [name] MD Entered By: [name] on 02/27/2023 12:56:00

## 2023-03-02 ENCOUNTER — Encounter (HOSPITAL_BASED_OUTPATIENT_CLINIC_OR_DEPARTMENT_OTHER): Payer: PPO | Admitting: General Surgery

## 2023-03-02 NOTE — Progress Notes (Signed)
[name], [name] ([id number]) [id number] Page 1 of 9 Visit Report for 02/27/2023 Arrival Information Details Patient Name: Date of Service: [name] 02/27/2023 3:15 PM Medical Record Number: [id number] Patient Account Number: [id number] Date of Birth/Sex: Treating RN: 1933-08-23 (89 y.o. M) Primary Care [name]: [name] Other Clinician: Referring Gearl Kimbrough: Treating Veryl Abril/Extender: [name] in Treatment: 34 Visit Information History Since Last Visit Added or deleted any medications: No Patient Arrived: Ambulatory Any new allergies or adverse reactions: No Arrival Time: 15:15 Had a fall or experienced change in No Accompanied By: self activities of daily living that may affect Transfer Assistance: None risk of falls: Patient Identification Verified: Yes Signs or symptoms of abuse/neglect since last visito No Secondary Verification Process Completed: Yes Hospitalized since last visit: No Patient Requires Transmission-Based Precautions: No Implantable device outside of the clinic excluding No Patient Has Alerts: Yes cellular tissue based products placed in the center Patient Alerts: Patient on Blood Thinner since last visit: Has Dressing in Place as Prescribed: Yes Pain Present Now: No Electronic Signature(s) Signed: 03/02/2023 4:00:31 PM By: Thayer Dallas Entered By: Thayer Dallas on 02/27/2023 12:15:40 -------------------------------------------------------------------------------- Encounter Discharge Information Details Patient Name: Date of Service: [name] 02/27/2023 3:15 PM Medical Record Number: [id number] Patient Account Number: [id number] Date of Birth/Sex: Treating RN: 24-May-1933 (89 y.o. [name] Primary Care Hayla Hinger: [name] Other Clinician: Referring Alexa Golebiewski: Treating Edelmiro Innocent/Extender: [name] in Treatment:  41 Encounter Discharge Information Items Post Procedure Vitals Discharge Condition: Stable Temperature (F): 97.8 Ambulatory Status: Ambulatory Pulse (bpm): 73 Discharge Destination: Home Respiratory Rate (breaths/min): 18 Transportation: Private Auto Blood Pressure (mmHg): 139/71 Accompanied By: granddaughter Schedule Follow-up Appointment: Yes Clinical Summary of Care: Electronic Signature(s) Signed: 02/27/2023 4:50:55 PM By: Shawn Stall RN, BSN Entered By: Shawn Stall on 02/27/2023 12:45:41 [name], [name] ([id number]) [id number] Page 2 of 9 -------------------------------------------------------------------------------- Lower Extremity Assessment Details Patient Name: Date of Service: [name] 02/27/2023 3:15 PM Medical Record Number: [id number] Patient Account Number: [id number] Date of Birth/Sex: Treating RN: 07/10/1933 (89 y.o. M) Primary Care Alwilda Gilland: [name] Other Clinician: Referring Alessa Mazur: Treating Sharlot Sturkey/Extender: [name] in Treatment: 34 Edema Assessment Assessed: [name]: No] Franne Forts: No] Edema: [Left: N] [Right: o] Calf Left: Right: Point of Measurement: From Medial Instep 32 cm Ankle Left: Right: Point of Measurement: From Medial Instep 22 cm Vascular Assessment Extremity colors, hair growth, and conditions: Extremity Color: [Right:Normal] Hair Growth on Extremity: [Right:No] Temperature of Extremity: [Right:Warm] Capillary Refill: [Right:< 3 seconds] Dependent Rubor: [Right:No No] Electronic Signature(s) Signed: 03/02/2023 4:00:31 PM By: Thayer Dallas Entered By: Thayer Dallas on 02/27/2023 12:15:53 -------------------------------------------------------------------------------- Multi Wound Chart Details Patient Name: Date of Service: [name] 02/27/2023 3:15 PM Medical Record Number: [id number] Patient Account Number: [id number] Date of Birth/Sex:  Treating RN: January 17, 1934 (89 y.o. M) Primary Care Mavrick Mcquigg: [name] Other Clinician: Referring Jhoselin Crume: Treating Ronnie Mallette/Extender: [name] in Treatment: 34 Vital Signs Height(in): 71 Pulse(bpm): 73 Weight(lbs): 170 Blood Pressure(mmHg): 139/71 Body Mass Index(BMI): 23.7 Temperature(F): 97.8 Respiratory Rate(breaths/min): 18 [3:Photos:] [N/A:N/A [id number] Page 3 of 9] Right Amputation Site - Toe Right, Lateral Foot N/A Wound Location: Gradually Appeared Shear/Friction N/A Wounding Event: Diabetic Wound/Ulcer of the Lower Diabetic Wound/Ulcer of the Lower N/A Primary Etiology: Extremity Extremity N/A Pressure Ulcer N/A Secondary Etiology: Cataracts, Anemia, Asthma, Chronic Cataracts, Anemia, Asthma, Chronic N/A Comorbid History: Obstructive Pulmonary Disease Obstructive Pulmonary Disease (COPD), Peripheral Arterial Disease, (COPD), Peripheral  Arterial Disease, Type II Diabetes, Gout, Osteoarthritis, Type II Diabetes, Gout, Osteoarthritis, Neuropathy Neuropathy 04/07/2022 11/21/2022 N/A Date Acquired: 3 12 N/A Weeks of Treatment: Healed - Epithelialized Open N/A Wound Status: No No N/A Wound Recurrence: 0x0x0 2x1.8x0.9 N/A Measurements L x W x D (cm) 0 2.827 N/A A (cm) : rea 0 2.545 N/A Volume (cm) : 100.00% 45.50% N/A % Reduction in A rea: 100.00% -391.30% N/A % Reduction in Volume: 6 Starting Position 1 (o'clock): 12 Ending Position 1 (o'clock): 0.2 Maximum Distance 1 (cm): No Yes N/A Undermining: Grade 3 Grade 2 N/A Classification: Small Medium N/A Exudate A mount: Serosanguineous Serosanguineous N/A Exudate Type: red, brown red, brown N/A Exudate Color: Epibole Distinct, outline attached N/A Wound Margin: None Present (0%) Medium (34-66%) N/A Granulation A mount: N/A Red, Pink N/A Granulation Quality: Large (67-100%) Medium (34-66%) N/A Necrotic A mount: Eschar Adherent Slough  N/A Necrotic Tissue: Fat Layer (Subcutaneous Tissue): Yes Fat Layer (Subcutaneous Tissue): Yes N/A Exposed Structures: Fascia: No Tendon: Yes Tendon: No Bone: Yes Muscle: No Fascia: No Joint: No Muscle: No Bone: No Joint: No Large (67-100%) Small (1-33%) N/A Epithelialization: Callus: Yes Callus: Yes N/A Periwound Skin Texture: Excoriation: No Excoriation: No Induration: No Induration: No Crepitus: No Crepitus: No Rash: No Rash: No Scarring: No Scarring: No Maceration: No Maceration: Yes N/A Periwound Skin Moisture: Dry/Scaly: No Dry/Scaly: No Atrophie Blanche: No Atrophie Blanche: No N/A Periwound Skin Color: Cyanosis: No Cyanosis: No Ecchymosis: No Ecchymosis: No Erythema: No Erythema: No Hemosiderin Staining: No Hemosiderin Staining: No Mottled: No Mottled: No Pallor: No Pallor: No Rubor: No Rubor: No No Abnormality No Abnormality N/A Temperature: N/A Cellular or Tissue Based Product N/A Procedures Performed: Treatment Notes Wound #3 (Amputation Site - Toe) Wound Laterality: Right Cleanser Peri-Wound Care Topical Primary Dressing Secondary Dressing Secured With Compression Wrap Compression Stockings Add-Ons Wound #4 (Foot) Wound Laterality: Right, Lateral Hyndman, [name] ([id number]) [id number] Page 4 of 9 Cleanser Soap and Water Discharge Instruction: May shower and wash wound with dial antibacterial soap and water prior to dressing change. Wound Cleanser Discharge Instruction: Cleanse the wound with wound cleanser prior to applying a clean dressing using gauze sponges, not tissue or cotton balls. Peri-Wound Care Skin Prep Discharge Instruction: Use skin prep as directed Topical Primary Dressing apligraf #4 Discharge Instruction: applied by Jaedynn Bohlken. cutimed sorbact swab or adaptic with steristrip Discharge Instruction: leave in place. Secondary Dressing ABD Pad, 5x9 Discharge Instruction: Apply over  primary dressing as directed. Secured With [employer], 4.5x3.1 (in/yd) Discharge Instruction: Secure with Kerlix as directed. 44M Medipore H Soft Cloth Surgical T ape, 4 x 10 (in/yd) Discharge Instruction: Secure with tape as directed. Compression Wrap Compression Stockings Add-Ons Electronic Signature(s) Signed: 02/28/2023 11:51:18 AM By: [name] MD Entered By: [name] on 02/27/2023 12:48:02 -------------------------------------------------------------------------------- Multi-Disciplinary Care Plan Details Patient Name: Date of Service: [name] 02/27/2023 3:15 PM Medical Record Number: [id number] Patient Account Number: [id number] Date of Birth/Sex: Treating RN: [birth year] (89 y.o. [name] Primary Care [place] Segler: [name] Other Clinician: Referring Jodene Polyak: Treating Larena Ohnemus/Extender: [name] in Treatment: 48 Active Inactive Wound/Skin Impairment Nursing Diagnoses: Impaired tissue integrity Knowledge deficit related to ulceration/compromised skin integrity Goals: Patient will have a decrease in wound volume by X% from date: (specify in notes) Date Initiated: 07/04/2022 Target Resolution Date: 03/31/2023 Goal Status: Active Patient/caregiver will verbalize understanding of skin care regimen Date Initiated: 07/04/2022 Target Resolution Date: 03/30/2023 Goal Status: Active [place], Erasto ([id number]) [id number] Page 5 of  9 Ulcer/skin breakdown will have a volume reduction of 30% by week 4 Date Initiated: 07/04/2022 Date Inactivated: 10/27/2022 Target Resolution Date: 11/05/2022 Goal Status: Met Ulcer/skin breakdown will have a volume reduction of 50% by week 8 Date Initiated: 07/04/2022 Date Inactivated: 10/27/2022 Target Resolution Date: 11/05/2022 Goal Status: Met Interventions: Assess patient/caregiver ability to obtain necessary supplies Assess  patient/caregiver ability to perform ulcer/skin care regimen upon admission and as needed Assess ulceration(s) every visit Notes: Electronic Signature(s) Signed: 02/27/2023 4:50:55 PM By: Shawn Stall RN, BSN Entered By: Shawn Stall on 02/27/2023 12:19:43 -------------------------------------------------------------------------------- Pain Assessment Details Patient Name: Date of Service: [name] 02/27/2023 3:15 PM Medical Record Number: [id number] Patient Account Number: [id number] Date of Birth/Sex: Treating RN: 21-Mar-1933 (89 y.o. M) Primary Care Syesha Thaw: [name] Other Clinician: Referring Gustave Lindeman: Treating Luv Mish/Extender: [name] in Treatment: 34 Active Problems Location of Pain Severity and Description of Pain Patient Has Paino No Site Locations Pain Management and Medication Current Pain Management: Electronic Signature(s) Signed: 03/02/2023 4:00:31 PM By: Thayer Dallas Entered By: Thayer Dallas on 02/27/2023 12:18:47 [name], [name] ([id number]) [id number] Page 6 of 9 -------------------------------------------------------------------------------- Patient/Caregiver Education Details Patient Name: Date of Service: [name] 12/23/2024andnbsp3:15 PM Medical Record Number: [id number] Patient Account Number: [id number] Date of Birth/Gender: Treating RN: 02-Jun-1933 (89 y.o. [name] Primary Care Physician: [name] Other Clinician: Referring Physician: Treating Physician/Extender: [name] in Treatment: 45 Education Assessment Education Provided To: Patient Education Topics Provided Wound/Skin Impairment: Handouts: Caring for Your Ulcer Methods: Explain/Verbal Responses: Reinforcements needed Electronic Signature(s) Signed: 02/27/2023 4:50:55 PM By: Shawn Stall RN, BSN Entered By: Shawn Stall on 02/27/2023  12:19:54 -------------------------------------------------------------------------------- Wound Assessment Details Patient Name: Date of Service: [name] 02/27/2023 3:15 PM Medical Record Number: [id number] Patient Account Number: [id number] Date of Birth/Sex: Treating RN: 1933-04-29 (89 y.o. [name] Primary Care Kawan Valladolid: [name] Other Clinician: Referring Margareta Laureano: Treating Damare Serano/Extender: [name] in Treatment: 34 Wound Status Wound Number: 3 Primary Diabetic Wound/Ulcer of the Lower Extremity Etiology: Wound Location: Right Amputation Site - Toe Wound Healed - Epithelialized Wounding Event: Gradually Appeared Status: Date Acquired: 04/07/2022 Comorbid Cataracts, Anemia, Asthma, Chronic Obstructive Pulmonary Weeks Of Treatment: 34 History: Disease (COPD), Peripheral Arterial Disease, Type II Diabetes, Clustered Wound: No Gout, Osteoarthritis, Neuropathy Photos Wound Measurements Length: (cm) Width: (cm) Depth: (cm) Area: (cm) Volume: (cm) Christoffersen, [name] ([id number]) Wound Description Classification: Grade 3 Wound Margin: Epibole Exudate Amount: Small Exudate Type: Serosanguineous Exudate Color: red, brown Foul Odor After Cleansing: Slough/Fibrino 0 % Reduction in Area: 100% 0 % Reduction in Volume: 100% 0 Epithelialization: Large (67-100%) 0 Tunneling: No 0 Undermining: No [id number] Page 7 of 9 No No Wound Bed Granulation Amount: None Present (0%) Exposed Structure Necrotic Amount: Large (67-100%) Fascia Exposed: No Necrotic Quality: Eschar Fat Layer (Subcutaneous Tissue) Exposed: Yes Tendon Exposed: No Muscle Exposed: No Joint Exposed: No Bone Exposed: No Periwound Skin Texture Texture Color No Abnormalities Noted: No No Abnormalities Noted: No Callus: Yes Atrophie Blanche: No Crepitus: No Cyanosis: No Excoriation: No Ecchymosis: No Induration:  No Erythema: No Rash: No Hemosiderin Staining: No Scarring: No Mottled: No Pallor: No Moisture Rubor: No No Abnormalities Noted: No Dry / Scaly: No Temperature / Pain Maceration: No Temperature: No Abnormality Treatment Notes Wound #3 (Amputation Site - Toe) Wound Laterality: Right Cleanser Peri-Wound Care Topical Primary Dressing Secondary Dressing Secured With Compression Wrap Compression Stockings Add-Ons Electronic Signature(s) Signed: 02/27/2023 4:50:55  PM By: Shawn Stall RN, BSN Entered By: Shawn Stall on 02/27/2023 12:39:50 -------------------------------------------------------------------------------- Wound Assessment Details Patient Name: Date of Service: [name] 02/27/2023 3:15 PM Medical Record Number: [id number] Patient Account Number: [id number] Date of Birth/Sex: Treating RN: 18-Dec-1933 (89 y.o. M) Primary Care Gracin Mcpartland: [name] Other Clinician: Referring Adaijah Endres: Treating Shelise Maron/Extender: [name] in Treatment: 34 Wound Status Wound Number: 4 Primary Diabetic Wound/Ulcer of the Lower Extremity Etiology: Wound Location: Right, Lateral Foot Secondary Pressure Ulcer Wounding Event: Shear/Friction [name], [name] ([id number]) [id number] Page 8 of 9 Wounding Event: Shear/Friction Etiology: Date Acquired: 11/21/2022 Wound Open Weeks Of Treatment: 12 Status: Clustered Wound: No Comorbid Cataracts, Anemia, Asthma, Chronic Obstructive Pulmonary History: Disease (COPD), Peripheral Arterial Disease, Type II Diabetes, Gout, Osteoarthritis, Neuropathy Photos Wound Measurements Length: (cm) 2 Width: (cm) 1.8 Depth: (cm) 0.9 Area: (cm) 2.827 Volume: (cm) 2.545 % Reduction in Area: 45.5% % Reduction in Volume: -391.3% Epithelialization: Small (1-33%) Undermining: Yes Starting Position (o'clock): 6 Ending Position (o'clock): 12 Maximum Distance: (cm) 0.2 Wound  Description Classification: Grade 2 Wound Margin: Distinct, outline attached Exudate Amount: Medium Exudate Type: Serosanguineous Exudate Color: red, brown Foul Odor After Cleansing: No Slough/Fibrino Yes Wound Bed Granulation Amount: Medium (34-66%) Exposed Structure Granulation Quality: Red, Pink Fascia Exposed: No Necrotic Amount: Medium (34-66%) Fat Layer (Subcutaneous Tissue) Exposed: Yes Necrotic Quality: Adherent Slough Tendon Exposed: Yes Muscle Exposed: No Joint Exposed: No Bone Exposed: Yes Periwound Skin Texture Texture Color No Abnormalities Noted: No No Abnormalities Noted: No Callus: Yes Atrophie Blanche: No Crepitus: No Cyanosis: No Excoriation: No Ecchymosis: No Induration: No Erythema: No Rash: No Hemosiderin Staining: No Scarring: No Mottled: No Pallor: No Moisture Rubor: No No Abnormalities Noted: No Dry / Scaly: No Temperature / Pain Maceration: Yes Temperature: No Abnormality Treatment Notes Wound #4 (Foot) Wound Laterality: Right, Lateral Cleanser Soap and Water Discharge Instruction: May shower and wash wound with dial antibacterial soap and water prior to dressing change. Wound Cleanser Discharge Instruction: Cleanse the wound with wound cleanser prior to applying a clean dressing using gauze sponges, not tissue or cotton balls. Peri-Wound Care Skin Prep Discharge Instruction: Use skin prep as directed [name], [name] ([id number]) [id number] Page 9 of 9 Topical Primary Dressing apligraf #4 Discharge Instruction: applied by Barbaraann Avans. cutimed sorbact swab or adaptic with steristrip Discharge Instruction: leave in place. Secondary Dressing ABD Pad, 5x9 Discharge Instruction: Apply over primary dressing as directed. Secured With [employer], 4.5x3.1 (in/yd) Discharge Instruction: Secure with Kerlix as directed. 73M Medipore H Soft Cloth Surgical T ape, 4 x 10 (in/yd) Discharge Instruction:  Secure with tape as directed. Compression Wrap Compression Stockings Add-Ons Electronic Signature(s) Signed: 03/02/2023 4:00:31 PM By: Thayer Dallas Entered By: Thayer Dallas on 02/27/2023 12:29:15 -------------------------------------------------------------------------------- Vitals Details Patient Name: Date of Service: [name] 02/27/2023 3:15 PM Medical Record Number: [id number] Patient Account Number: [id number] Date of Birth/Sex: Treating RN: 08-Oct-1933 (89 y.o. M) Primary Care Velora Horstman: [name] Other Clinician: Referring Eternity Dexter: Treating Arnella Pralle/Extender: [name] in Treatment: 34 Vital Signs Time Taken: 15:18 Temperature (F): 97.8 Height (in): 71 Pulse (bpm): 73 Weight (lbs): 170 Respiratory Rate (breaths/min): 18 Body Mass Index (BMI): 23.7 Blood Pressure (mmHg): 139/71 Reference Range: 80 - 120 mg / dl Electronic Signature(s) Signed: 03/02/2023 4:00:31 PM By: Thayer Dallas Entered By: Thayer Dallas on 02/27/2023 12:18:40

## 2023-03-03 ENCOUNTER — Encounter (HOSPITAL_BASED_OUTPATIENT_CLINIC_OR_DEPARTMENT_OTHER): Payer: PPO | Admitting: General Surgery

## 2023-03-06 ENCOUNTER — Encounter (HOSPITAL_BASED_OUTPATIENT_CLINIC_OR_DEPARTMENT_OTHER): Payer: PPO | Admitting: General Surgery

## 2023-03-07 ENCOUNTER — Encounter (HOSPITAL_BASED_OUTPATIENT_CLINIC_OR_DEPARTMENT_OTHER): Payer: PPO | Admitting: Internal Medicine

## 2023-03-07 ENCOUNTER — Encounter (HOSPITAL_BASED_OUTPATIENT_CLINIC_OR_DEPARTMENT_OTHER): Payer: PPO | Admitting: General Surgery

## 2023-03-07 DIAGNOSIS — E11621 Type 2 diabetes mellitus with foot ulcer: Secondary | ICD-10-CM | POA: Diagnosis not present

## 2023-03-08 NOTE — Progress Notes (Signed)
 DARCO, Lindsey ([id number]) [id number] Page 1 of 10 Visit Report for 03/07/2023 Chief Complaint Document Details Patient Name: Date of Service: [name] 03/07/2023 3:00 PM Medical Record Number: [id number] Patient Account Number: [id number] Date of Birth/Sex: Treating RN: 1933-04-01 (89 y.o. M) Primary Care Provider: Jennelle Riis Other Clinician: Referring Provider: Treating Provider/Extender: Marolyn Delon Jennelle Riis Devra in Treatment: 35 Information Obtained from: Patient Chief Complaint 07/04/2022; right foot wound Electronic Signature(s) Signed: 03/07/2023 4:26:08 PM By: Marolyn Delon MD FACS Entered By: Marolyn Delon on 03/07/2023 16:20:10 -------------------------------------------------------------------------------- Cellular or Tissue Based Product Details Patient Name: Date of Service: [name] 03/07/2023 3:00 PM Medical Record Number: [id number] Patient Account Number: [id number] Date of Birth/Sex: Treating RN: [birth year] (89 y.o. NETTY [name] Primary Care Provider: Jennelle Riis Other Clinician: Referring Provider: Treating Provider/Extender: Marolyn Delon Jennelle Riis Devra in Treatment: 53 Cellular or Tissue Based Product Type Wound #4 Right,Lateral Foot Applied to: Performed By: Physician Marolyn Delon, MD The following information was scribed by: [name] The information was scribed for: Marolyn Delon Cellular or Tissue Based Product Type: Apligraf Level of Consciousness (Pre-procedure): Awake and Alert Pre-procedure Verification/Time Out Yes - 15:40 Taken: Location: genitalia / hands / feet / multiple digits Wound Size (sq cm): 3.04 Product Size (sq cm): 44 Waste Size (sq cm): 25 Waste Reason: size of wound Amount of Product Applied (sq cm): 19 Instrument Used: Curette Lot #: [id number] Order #: 5/5 Expiration Date: 03/14/2023 Fenestrated:  No Reconstituted: Yes Solution Type: Normal Saline Solution Amount: 8 ml Lot #: [id number] KS Solution Expiration Date: 06/25/2024 Secured: Yes Secured With: Steri-Strips Dressing Applied: Yes Primary Dressing: Adaptic,Hydrogel and Sorbact Procedural Pain: 0 Soderberg, Shi ([id number]) [id number] Page 2 of 10 Post Procedural Pain: 0 Response to Treatment: Procedure was tolerated well Level of Consciousness (Post- Awake and Alert procedure): Post Procedure Diagnosis Same as Pre-procedure Electronic Signature(s) Signed: 03/07/2023 4:26:08 PM By: Marolyn Delon MD FACS Signed: 03/07/2023 4:52:13 PM By: [name] Pollen RN Entered By: [name] on 03/07/2023 16:01:37 -------------------------------------------------------------------------------- Debridement Details Patient Name: Date of Service: [name] 03/07/2023 3:00 PM Medical Record Number: [id number] Patient Account Number: [id number] Date of Birth/Sex: Treating RN: 06-12-1933 (89 y.o. NETTY [name] Primary Care Provider: Jennelle Riis Other Clinician: Referring Provider: Treating Provider/Extender: Marolyn Delon Jennelle Riis Devra in Treatment: 35 Debridement Performed for Assessment: Wound #4 Right,Lateral Foot Performed By: Physician Marolyn Delon, MD The following information was scribed by: [name] The information was scribed for: Marolyn Delon Debridement Type: Debridement Severity of Tissue Pre Debridement: Fat layer exposed Level of Consciousness (Pre-procedure): Awake and Alert Pre-procedure Verification/Time Out Yes - 15:40 Taken: Start Time: 15:40 Pain Control: Lidocaine  4% T opical Solution Percent of Wound Bed Debrided: 100% T Area Debrided (cm): otal 2.39 Tissue and other material debrided: Viable, Non-Viable, Slough, Subcutaneous, Tendon, Slough Level: Skin/Subcutaneous Tissue/Muscle Debridement Description:  Excisional Instrument: Curette Bleeding: Minimum Hemostasis Achieved: Pressure Response to Treatment: Procedure was tolerated well Level of Consciousness (Post- Awake and Alert procedure): Post Debridement Measurements of Total Wound Length: (cm) 1.9 Width: (cm) 1.6 Depth: (cm) 0.6 Volume: (cm) 1.433 Character of Wound/Ulcer Post Debridement: Improved Severity of Tissue Post Debridement: Fat layer exposed Post Procedure Diagnosis Same as Pre-procedure Electronic Signature(s) Signed: 03/07/2023 4:26:08 PM By: Marolyn Delon MD FACS Signed: 03/07/2023 4:52:13 PM By: [name] Pollen RN Entered By: [name] on 03/07/2023 15:59:24 Mccraw, Kethan ([id number]) [id number] Page 3 of 10 -------------------------------------------------------------------------------- HPI Details Patient Name: Date of Service: [name] RDSO  LOISE GAILS [name] 03/07/2023 3:00 PM Medical Record Number: [id number] Patient Account Number: [id number] Date of Birth/Sex: Treating RN: 1933-07-29 (89 y.o. M) Primary Care Provider: Jennelle Riis Other Clinician: Referring Provider: Treating Provider/Extender: Marolyn Delon Jennelle Riis Devra in Treatment: 35 History of Present Illness HPI Description: 07/04/2022 Mr. Tarvares Lant is an 89 year old male with a past medical history of uncontrolled, insulin  dependent type 2 diabetes, CKD stage III, and COPD that presents to the clinic for a right foot wound. He has an extensive history of peripheral arterial disease to the right leg. He has had a right superficial femoral to posterior tibial artery bypass by Dr. Jerome in 2020. He had a right common femoral to plantar posterior tibial artery bypass on 02/03/2022 by Dr. Magda and at that time he also had a right toe amputation. He states this wound site had healed completely. Prior to this he had amputations to the first and second digit to the right foot. He presents today  with an open wound to the previous right toe amputation site for the past 2 months. He has been following with vein and vascular for this issue. Most recent ABIs were on 06/22/2022 that showed absent waveforms in the right lower extremity. Currently his right lower extremity bypass is occluded and no plan for another anteriorgram and has he likely has no revascularization options. Currently he has been using Betadine to the wound bed. He denies signs of infection. 07/11/2022 patient arrives in clinic today using Hydrofera Blue on the toe amputation site. The patient is revascularized and is not felt to have any other options for revascularization. We are using Hydrofera Blue and gauze. 5/13; patient presents for follow-up. He has been using Hydrofera Blue to the toe amputation site. He has had no issues. Wound is smaller. He has been approved for [facility] and was agreeable to have this placed in office today. 5/20; patient presents for follow-up. We have been using Kerecis to the wound bed. There is been improvement in healing. Unfortunately patient did not change the outer dressing for a week. There is slight maceration to the periwound. Unclear why he did not do this. 5/30; patient presents for follow-up. We have been using Kerecis to the wound bed. More maceration to the periwound today. No signs of infection. 6/10; patient presents for follow-up. He has been using Hydrofera Blue to the wound bed. He has no issues or complaints today. Maceration has almost completely resolved. 6/18; patient presents for follow-up. He has been using blast X and collagen to the wound bed without issues. Wound appears smaller today. 6/28; patient presents for follow-up. He has been using blast X and collagen to the wound bed. The wound is smaller. 7/11; patient presents for follow-up. He has been using blast X and collagen to the wound bed. Wound is smaller. He has slough accumulation. 7/25; patient presents for  follow-up. He has been using blast X and collagen to the wound bed. Wound is stable. He denies signs of infection. 8/26; patient presents for follow-up. He has been using blast X and collagen to the wound bed. Wound is slightly smaller. He has no issues or complaints. 8/29; patient presents for follow-up. He has been using blast X and Aquacel Ag to the wound bed. He has been using a Velcro wrap at his toes to keep the toes closer together to help with wound healing. He has no issues or complaints today. Wound is smaller. 9/12; patient presents for follow-up. He has been using blast  X and Aquacel Ag to the wound bed. Unfortunately he has developed a pressure injury to the lateral aspect of the foot. He is not sure how this started. No open wound there. He denies pain. 9/23; this patient has a wound on the right foot at the amputation site of her third toe. It is right up against the fourth toe making it somewhat difficult to dress. When he was here last time he had a blister/DTI on the lateral aspect of the fifth MTP 9/30; patient has a wound on the right foot amputation site of his third toe. Probably in the third fourth webspace. I changed him to endoform last week. The wound looks somewhat better The area over the lateral part of the fifth metatarsal head which was blistered with some central deep tissue looking injury was leaking fluid today. The blister was unroofed. I used pickups and scissors to remove this. I do not know that there is anything technically open here although there is certainly some central tissue at risk 10/7; his original wound at the amputation site in his third fourth webspace looks stable to improved. We have been using endoform. The real concern today is the area over the lateral part of the fifth metatarsal head. This initially looked like a blister with central deep tissue injury I unroofed the blister last week there was nothing technically open but the deep tissue  injury was concerning. T oday he comes in with a necrotic surface I remove this. The central part of this area which I am assuming was the deep tissue injury is necrotic I removed some of the necrotic tissue with a #15 scalpel and pickups. There is undermining around the wound edge here. 10/14; his original wound at the amputation site of his third fourth toe webspace looks improved we have gradually been making progress here. Using endoform. Unfortunately the area over the lateral part of the right fifth metatarsal head continues to deteriorate. Necrotic surface last week continues this week. The PCR culture I did last week did not manage to make it to the right lab. I have looked at his x-ray the bone underneath does not look that bad to me however we have not yet had a radiologist interpretation. We have been using Iodoflex this week. 10/21; the original amputation site of his third fourth toe webspace continues to look reasonably healthy and making slow but steady progress we have been using endoform The area on the right lateral fifth metatarsal head has been deteriorating problem. PCR culture grew MRSA. I have him on doxycycline . There is exposed bone in the wound under necrotic material. I still do not have the x-ray back from October 7 at least in terms of the report. PETRO, Ori ([id number]) [id number] Page 4 of 10 Addendum 12/27/2022; I have looked back on this patient's vascular status. He is status post a right common femoral to posterior tibial artery bypass in 2020. He had a right common femoral to plantar posterior tibial bypass by Dr. Magda in November 2023. He also had a right third toe amputation at the same time. The wound that we have been dealing with the third toe amputation site was present after the surgery. He was last seen by vascular surgery on 06/21/2022 and was supposed to follow-up with Dr. Magda in 3 weeks although I do not see that  appointment. I have included the major findings in the last right lower extremity angiogram on 01/21/2022 10/28;SABRA The x-ray report that I ordered several  weeks ago is back. Indeed there was nothing under the fifth met head on the right however notable for bone necrosis under the wound on the left third toe. This suggest osteomyelitis. I am going to extend his doxycycline  for 3 further weeks. PCR culture on the lateral foot grew MRSA I sent a secure text message to Dr. Vonzell. His reply was that there was no additional options for revascularization. The patient went to see Dr. Vonzell and that is the message they came up with. He did offer him an amputation [o Above-knee]. We have been using endoform to the third toe amputation site. Silver alginate to the new wound over the fifth metatarsal head 11/4; patient continues on doxycycline  which she is handling well. He started hyperbaric oxygen  today and tolerated the first treatment well. We have been using endoform to the third toe amputation site which was the original wound he came to the clinic for. We are using Santyl and silver alginate to the wound over the fifth metatarsal head which was the wound that opened while he was under observation. Culture grew MRSA and the wound over the fifth metatarsal head 11/11 continuing doxycycline . He is tolerating hyperbarics although I am continually notified about tachycardia in the 100-110 range. Looking back on EKGs shows this is a longstanding problem sinus tachycardia. He also has left anterior fascicular block. He does not describe chest pain The original wound on the third toe amputation site looks a lot better this is contracting however the area on the right lateral fifth metatarsal head only has a rim of height of granulation tissue around what looks to be periosteum 11/18; continuing doxycycline . He is tolerating hyperbarics. The original wound on the third toe amputation site is very small and  healthy however the wound the developed on the right lateral fifth met head which probably was a combination of infection and ischemic has a nonviable surface. We have been using Hydrofera Blue and Santyl he continues on doxycycline . The doxycycline  was same that the x-ray from 10/31 showing high-grade phalangeal bone erosion suspicious for osteomyelitis. At that time there was nothing demonstrated on the fifth met head. I have repeated the x-ray of the foot looking specifically at the fifth met head however the report is not available. 12/3; the patient's original wound on the amputation site of his third toe is closed overall though it is a slight surface eschar I did not remove this. The problematic area on the right lateral fifth metatarsal head remains the major issue here. He is not a candidate for further revascularization. He has what looks to be some viable granulation over the bone which is an improvement. However extending into the superior part of his foot a large open area over the bone itself. We used Apligraf #1 today. He continues with HBO 12/10; the original wound on the amputation site is eschared over. There is probably a small open area remaining here but I did not debride this. The original osteomyelitis was in this area. The subsequent wound is over the right lateral fifth metatarsal head. We applied Apligraf #1 last week. The wound has improved. The option for this patient was amputation at some level. He thinks he was told that this would be above-knee certainly complicating to decide in that direction. Fortunately his wound looks somewhat better today. No obvious infection 12/17; his original wound on the amputation site of his third toe is still eschared over but I have not disturb this. The original osteomyelitis that  we are treating him for was in this area. The subsequent wound is over the right lateral fifth met head. This was a combination of ischemic and infected wound  that developed rapidly from 1 week to the next in this clinic. 12/23; his original wound on the amputation site of his third toe is healed. This is the area with underlying osteomyelitis that he completed hyperbaric oxygen  for on Friday. During the course of his stay however he developed a rapidly progressive wound on the right lateral fifth metatarsal head which was a combination of ischemia probably with coexistent infection. I gave him 6 weeks of doxycycline . We extended HBO. And I have been applying Apligraf to this area [#4 to this area today]. We have managed to get the wound to contract however there is still precious little if any tissue over this bone which in itself does not feel completely viable. He is not a candidate for a ray amputation secondary to known PAD. I verified this with vein and vascular Dr. Vonzell. 03/07/2023: Apparently the wound looks better today. There is tissue coverage of the exposed bone. The depth of the wound has come in overall, but there is an increase in the undermining towards the dorsal part of his foot. There is nonviable tendon exposed. Electronic Signature(s) Signed: 03/07/2023 4:26:08 PM By: Marolyn Nest MD FACS Entered By: Marolyn Nest on 03/07/2023 16:22:46 -------------------------------------------------------------------------------- Physical Exam Details Patient Name: Date of Service: [name] 03/07/2023 3:00 PM Medical Record Number: [id number] Patient Account Number: [id number] Date of Birth/Sex: Treating RN: 1933-07-29 (89 y.o. M) Primary Care Provider: Jennelle Riis Other Clinician: Referring Provider: Treating Provider/Extender: Marolyn Nest Jennelle Riis Devra in Treatment: 35 Constitutional Slightly hypertensive. . . . no acute distress. SLIWA, Joahan ([id number]) [id number] Page 5 of 10 Respiratory Normal work of breathing on room air.. Notes 03/07/2023: Apparently  the wound looks better today. There is tissue coverage of the exposed bone. The depth of the wound has come in overall, but there is an increase in the undermining towards the dorsal part of his foot. There is nonviable tendon exposed. Electronic Signature(s) Signed: 03/07/2023 4:26:08 PM By: Marolyn Nest MD FACS Entered By: Marolyn Nest on 03/07/2023 16:23:58 -------------------------------------------------------------------------------- Physician Orders Details Patient Name: Date of Service: [name] 03/07/2023 3:00 PM Medical Record Number: [id number] Patient Account Number: [id number] Date of Birth/Sex: Treating RN: 15-Jan-1934 (89 y.o. NETTY [name] Primary Care Provider: Jennelle Riis Other Clinician: Referring Provider: Treating Provider/Extender: Marolyn Nest Jennelle Riis Devra in Treatment: 78 The following information was scribed by: [name] The information was scribed for: Marolyn Nest Verbal / Phone Orders: No Diagnosis Coding ICD-10 Coding Code Description L97.514 Non-pressure chronic ulcer of other part of right foot with necrosis of bone E11.621 Type 2 diabetes mellitus with foot ulcer I70.235 Atherosclerosis of native arteries of right leg with ulceration of other part of foot Follow-up Appointments ppointment in 1 week. - Dr. Rosan March 14, 2023 Tuesday 2:15PM Return A Other: - [employer] [facility]hetic (In clinic) Topical Lidocaine  5% applied to wound bed Cellular or Tissue Based Products Wound #4 Right,Lateral Foot Cellular or Tissue Based Product Type: - apligraf-applied #1 02/08/2023 apligraf-applied #2 02/14/23 Apligraf- applied #3 02/21/2023 Apligraf- applied #4 02/27/2023 Apligraf #5 applied 03/07/23 Bathing/ Shower/ Hygiene May shower with protection but do not get wound dressing(s) wet. Protect dressing(s) with water repellant cover (for example, large plastic bag) or a cast cover and may then take  shower. Off-Loading Open  toe surgical shoe to: - right foot. wear while walking and standing. minimize pressure and walking/ standing. Wound Treatment Wound #4 - Foot Wound Laterality: Right, Lateral Cleanser: Soap and Water 1 x Per Day/30 Days Discharge Instructions: May shower and wash wound with dial antibacterial soap and water prior to dressing change. Cleanser: Wound Cleanser 1 x Per Day/30 Days Discharge Instructions: Cleanse the wound with wound cleanser prior to applying a clean dressing using gauze sponges, not tissue or cotton balls. Peri-Wound Care: Skin Prep 1 x Per Day/30 Days Discharge Instructions: Use skin prep as directed Prim Dressing: apligraf #5 1 x Per Day/30 Days ary Discharge Instructions: applied by provider. GARDEN, Sufian ([id number]) [id number] Page 6 of 10 Prim Dressing: cutimed sorbact swab or adaptic with steristrip ary 1 x Per Day/30 Days Discharge Instructions: leave in place. Secondary Dressing: ABD Pad, 5x9 1 x Per Day/30 Days Discharge Instructions: Apply over primary dressing as directed. Secured With: [employer], 4.5x3.1 (in/yd) 1 x Per Day/30 Days Discharge Instructions: Secure with Kerlix as directed. Secured With: 46M Medipore H Soft Cloth Surgical T ape, 4 x 10 (in/yd) 1 x Per Day/30 Days Discharge Instructions: Secure with tape as directed. Electronic Signature(s) Signed: 03/07/2023 4:26:08 PM By: Marolyn Nest MD FACS Entered By: Marolyn Nest on 03/07/2023 16:24:29 -------------------------------------------------------------------------------- Problem List Details Patient Name: Date of Service: [name] 03/07/2023 3:00 PM Medical Record Number: [id number] Patient Account Number: [id number] Date of Birth/Sex: Treating RN: [birth year] (89 y.o. M) Primary Care Provider: Jennelle Riis Other Clinician: Referring Provider: Treating Provider/Extender: Marolyn Nest Jennelle Riis Devra in Treatment: 46 Active Problems ICD-10 Encounter Code Description Active Date MDM Diagnosis L97.514 Non-pressure chronic ulcer of other part of right foot with necrosis of bone 12/26/2022 No Yes E11.621 Type 2 diabetes mellitus with foot ulcer 07/04/2022 No Yes I70.235 Atherosclerosis of native arteries of right leg with ulceration of other part of 07/04/2022 No Yes foot Inactive Problems ICD-10 Code Description Active Date Inactive Date L97.512 Non-pressure chronic ulcer of other part of right foot with fat layer exposed 07/04/2022 07/04/2022 Resolved Problems Electronic Signature(s) Signed: 03/07/2023 4:26:08 PM By: Marolyn Nest MD FACS Entered By: Marolyn Nest on 03/07/2023 16:19:11 Mota, Zuriel ([id number]) [id number] Page 7 of 10 -------------------------------------------------------------------------------- Progress Note Details Patient Name: Date of Service: [name] 03/07/2023 3:00 PM Medical Record Number: [id number] Patient Account Number: [id number] Date of Birth/Sex: Treating RN: December 12, 1933 (89 y.o. M) Primary Care Provider: Jennelle Riis Other Clinician: Referring Provider: Treating Provider/Extender: Marolyn Nest Jennelle Riis Devra in Treatment: 35 Subjective Chief Complaint Information obtained from Patient 07/04/2022; right foot wound History of Present Illness (HPI) 07/04/2022 Mr. Siaosi Alter is an 89 year old male with a past medical history of uncontrolled, insulin  dependent type 2 diabetes, CKD stage III, and COPD that presents to the clinic for a right foot wound. He has an extensive history of peripheral arterial disease to the right leg. He has had a right superficial femoral to posterior tibial artery bypass by Dr. Jerome in 2020. He had a right common femoral to plantar posterior tibial artery bypass on 02/03/2022 by Dr. Magda and at that time he also had a right toe  amputation. He states this wound site had healed completely. Prior to this he had amputations to the first and second digit to the right foot. He presents today with an open wound to the previous right toe amputation site for the past 2 months. He has been following with vein and vascular  for this issue. Most recent ABIs were on 06/22/2022 that showed absent waveforms in the right lower extremity. Currently his right lower extremity bypass is occluded and no plan for another anteriorgram and has he likely has no revascularization options. Currently he has been using Betadine to the wound bed. He denies signs of infection. 07/11/2022 patient arrives in clinic today using Hydrofera Blue on the toe amputation site. The patient is revascularized and is not felt to have any other options for revascularization. We are using Hydrofera Blue and gauze. 5/13; patient presents for follow-up. He has been using Hydrofera Blue to the toe amputation site. He has had no issues. Wound is smaller. He has been approved for [facility] and was agreeable to have this placed in office today. 5/20; patient presents for follow-up. We have been using Kerecis to the wound bed. There is been improvement in healing. Unfortunately patient did not change the outer dressing for a week. There is slight maceration to the periwound. Unclear why he did not do this. 5/30; patient presents for follow-up. We have been using Kerecis to the wound bed. More maceration to the periwound today. No signs of infection. 6/10; patient presents for follow-up. He has been using Hydrofera Blue to the wound bed. He has no issues or complaints today. Maceration has almost completely resolved. 6/18; patient presents for follow-up. He has been using blast X and collagen to the wound bed without issues. Wound appears smaller today. 6/28; patient presents for follow-up. He has been using blast X and collagen to the wound bed. The wound is smaller. 7/11; patient  presents for follow-up. He has been using blast X and collagen to the wound bed. Wound is smaller. He has slough accumulation. 7/25; patient presents for follow-up. He has been using blast X and collagen to the wound bed. Wound is stable. He denies signs of infection. 8/26; patient presents for follow-up. He has been using blast X and collagen to the wound bed. Wound is slightly smaller. He has no issues or complaints. 8/29; patient presents for follow-up. He has been using blast X and Aquacel Ag to the wound bed. He has been using a Velcro wrap at his toes to keep the toes closer together to help with wound healing. He has no issues or complaints today. Wound is smaller. 9/12; patient presents for follow-up. He has been using blast X and Aquacel Ag to the wound bed. Unfortunately he has developed a pressure injury to the lateral aspect of the foot. He is not sure how this started. No open wound there. He denies pain. 9/23; this patient has a wound on the right foot at the amputation site of her third toe. It is right up against the fourth toe making it somewhat difficult to dress. When he was here last time he had a blister/DTI on the lateral aspect of the fifth MTP 9/30; patient has a wound on the right foot amputation site of his third toe. Probably in the third fourth webspace. I changed him to endoform last week. The wound looks somewhat better The area over the lateral part of the fifth metatarsal head which was blistered with some central deep tissue looking injury was leaking fluid today. The blister was unroofed. I used pickups and scissors to remove this. I do not know that there is anything technically open here although there is certainly some central tissue at risk 10/7; his original wound at the amputation site in his third fourth webspace looks stable to improved.  We have been using endoform. The real concern today is the area over the lateral part of the fifth metatarsal head. This  initially looked like a blister with central deep tissue injury I unroofed the blister last week there was nothing technically open but the deep tissue injury was concerning. T oday he comes in with a necrotic surface I remove this. The central part of this area which I am assuming was the deep tissue injury is necrotic I removed some of the necrotic tissue with a #15 scalpel and pickups. There is undermining around the wound edge here. 10/14; his original wound at the amputation site of his third fourth toe webspace looks improved we have gradually been making progress here. Using endoform. Unfortunately the area over the lateral part of the right fifth metatarsal head continues to deteriorate. Necrotic surface last week continues this week. The PCR culture I did last week did not manage to make it to the right lab. I have looked at his x-ray the bone underneath does not look that bad to me however we have not yet had a radiologist interpretation. We have been using Iodoflex this week. 10/21; the original amputation site of his third fourth toe webspace continues to look reasonably healthy and making slow but steady progress we have been Terrien, Charleton ([id number]) [id number] Page 8 of 10 using endoform The area on the right lateral fifth metatarsal head has been deteriorating problem. PCR culture grew MRSA. I have him on doxycycline . There is exposed bone in the wound under necrotic material. I still do not have the x-ray back from October 7 at least in terms of the report. Addendum 12/27/2022; I have looked back on this patient's vascular status. He is status post a right common femoral to posterior tibial artery bypass in 2020. He had a right common femoral to plantar posterior tibial bypass by Dr. Magda in November 2023. He also had a right third toe amputation at the same time. The wound that we have been dealing with the third toe amputation site was present  after the surgery. He was last seen by vascular surgery on 06/21/2022 and was supposed to follow-up with Dr. Magda in 3 weeks although I do not see that appointment. I have included the major findings in the last right lower extremity angiogram on 01/21/2022 10/28;SABRA The x-ray report that I ordered several weeks ago is back. Indeed there was nothing under the fifth met head on the right however notable for bone necrosis under the wound on the left third toe. This suggest osteomyelitis. I am going to extend his doxycycline  for 3 further weeks. PCR culture on the lateral foot grew MRSA I sent a secure text message to Dr. Vonzell. His reply was that there was no additional options for revascularization. The patient went to see Dr. Vonzell and that is the message they came up with. He did offer him an amputation [o Above-knee]. We have been using endoform to the third toe amputation site. Silver alginate to the new wound over the fifth metatarsal head 11/4; patient continues on doxycycline  which she is handling well. He started hyperbaric oxygen  today and tolerated the first treatment well. We have been using endoform to the third toe amputation site which was the original wound he came to the clinic for. We are using Santyl and silver alginate to the wound over the fifth metatarsal head which was the wound that opened while he was under observation. Culture grew MRSA and the wound over  the fifth metatarsal head 11/11 continuing doxycycline . He is tolerating hyperbarics although I am continually notified about tachycardia in the 100-110 range. Looking back on EKGs shows this is a longstanding problem sinus tachycardia. He also has left anterior fascicular block. He does not describe chest pain The original wound on the third toe amputation site looks a lot better this is contracting however the area on the right lateral fifth metatarsal head only has a rim of height of granulation tissue around what looks  to be periosteum 11/18; continuing doxycycline . He is tolerating hyperbarics. The original wound on the third toe amputation site is very small and healthy however the wound the developed on the right lateral fifth met head which probably was a combination of infection and ischemic has a nonviable surface. We have been using Hydrofera Blue and Santyl he continues on doxycycline . The doxycycline  was same that the x-ray from 10/31 showing high-grade phalangeal bone erosion suspicious for osteomyelitis. At that time there was nothing demonstrated on the fifth met head. I have repeated the x-ray of the foot looking specifically at the fifth met head however the report is not available. 12/3; the patient's original wound on the amputation site of his third toe is closed overall though it is a slight surface eschar I did not remove this. The problematic area on the right lateral fifth metatarsal head remains the major issue here. He is not a candidate for further revascularization. He has what looks to be some viable granulation over the bone which is an improvement. However extending into the superior part of his foot a large open area over the bone itself. We used Apligraf #1 today. He continues with HBO 12/10; the original wound on the amputation site is eschared over. There is probably a small open area remaining here but I did not debride this. The original osteomyelitis was in this area. The subsequent wound is over the right lateral fifth metatarsal head. We applied Apligraf #1 last week. The wound has improved. The option for this patient was amputation at some level. He thinks he was told that this would be above-knee certainly complicating to decide in that direction. Fortunately his wound looks somewhat better today. No obvious infection 12/17; his original wound on the amputation site of his third toe is still eschared over but I have not disturb this. The original osteomyelitis that we are  treating him for was in this area. The subsequent wound is over the right lateral fifth met head. This was a combination of ischemic and infected wound that developed rapidly from 1 week to the next in this clinic. 12/23; his original wound on the amputation site of his third toe is healed. This is the area with underlying osteomyelitis that he completed hyperbaric oxygen  for on Friday. During the course of his stay however he developed a rapidly progressive wound on the right lateral fifth metatarsal head which was a combination of ischemia probably with coexistent infection. I gave him 6 weeks of doxycycline . We extended HBO. And I have been applying Apligraf to this area [#4 to this area today]. We have managed to get the wound to contract however there is still precious little if any tissue over this bone which in itself does not feel completely viable. He is not a candidate for a ray amputation secondary to known PAD. I verified this with vein and vascular Dr. Vonzell. 03/07/2023: Apparently the wound looks better today. There is tissue coverage of the exposed bone. The depth  of the wound has come in overall, but there is an increase in the undermining towards the dorsal part of his foot. There is nonviable tendon exposed. Objective Constitutional Slightly hypertensive. no acute distress. Vitals Time Taken: 3:13 AM, Height: 71 in, Weight: 170 lbs, BMI: 23.7, Temperature: 98 F, Pulse: 94 bpm, Respiratory Rate: 18 breaths/min, Blood Pressure: 144/81 mmHg. Respiratory Normal work of breathing on room air.. General Notes: 03/07/2023: Apparently the wound looks better today. There is tissue coverage of the exposed bone. The depth of the wound has come in overall, but there is an increase in the undermining towards the dorsal part of his foot. There is nonviable tendon exposed. ABRAHAMSEN, Lian ([id number]) [id number] Page 9 of 10 Integumentary (Hair, Skin) Wound #4  status is Open. Original cause of wound was Shear/Friction. The date acquired was: 11/21/2022. The wound has been in treatment 13 weeks. The wound is located on the Right,Lateral Foot. The wound measures 1.9cm length x 1.6cm width x 0.6cm depth; 2.388cm^2 area and 1.433cm^3 volume. There is bone, tendon, and Fat Layer (Subcutaneous Tissue) exposed. There is no tunneling noted, however, there is undermining starting at 6:00 and ending at 9:00 with a maximum distance of 0.7cm. There is a medium amount of serosanguineous drainage noted. The wound margin is distinct with the outline attached to the wound base. There is medium (34-66%) red, pink granulation within the wound bed. There is a medium (34-66%) amount of necrotic tissue within the wound bed including Adherent Slough. The periwound skin appearance exhibited: Callus, Maceration. The periwound skin appearance did not exhibit: Crepitus, Excoriation, Induration, Rash, Scarring, Dry/Scaly, Atrophie Blanche, Cyanosis, Ecchymosis, Hemosiderin Staining, Mottled, Pallor, Rubor, Erythema. Periwound temperature was noted as No Abnormality. Assessment Active Problems ICD-10 Non-pressure chronic ulcer of other part of right foot with necrosis of bone Type 2 diabetes mellitus with foot ulcer Atherosclerosis of native arteries of right leg with ulceration of other part of foot Procedures Wound #4 Pre-procedure diagnosis of Wound #4 is a Diabetic Wound/Ulcer of the Lower Extremity located on the Right,Lateral Foot .Severity of Tissue Pre Debridement is: Fat layer exposed. There was a Excisional Skin/Subcutaneous Tissue/Muscle Debridement with a total area of 2.39 sq cm performed by Marolyn Nest, MD. With the following instrument(s): Curette to remove Viable and Non-Viable tissue/material. Material removed includes Tendon, Subcutaneous Tissue, and Slough after achieving pain control using Lidocaine  4% Topical Solution. No specimens were taken. A time out  was conducted at 15:40, prior to the start of the procedure. A Minimum amount of bleeding was controlled with Pressure. The procedure was tolerated well. Post Debridement Measurements: 1.9cm length x 1.6cm width x 0.6cm depth; 1.433cm^3 volume. Character of Wound/Ulcer Post Debridement is improved. Severity of Tissue Post Debridement is: Fat layer exposed. Post procedure Diagnosis Wound #4: Same as Pre-Procedure Pre-procedure diagnosis of Wound #4 is a Diabetic Wound/Ulcer of the Lower Extremity located on the Right,Lateral Foot. A skin graft procedure using a bioengineered skin substitute/cellular or tissue based product was performed by Marolyn Nest, MD with the following instrument(s): Curette. Apligraf was applied and secured with Steri-Strips. 19 sq cm of product was utilized and 25 sq cm was wasted due to size of wound. Post Application, Adaptic,Hydrogel and Sorbact was applied. A Time Out was conducted at 15:40, prior to the start of the procedure. The procedure was tolerated well with a pain level of 0 throughout and a pain level of 0 following the procedure. Post procedure Diagnosis Wound #4: Same as Pre-Procedure . Plan Follow-up Appointments:  Return Appointment in 1 week. - Dr. Rosan March 14, 2023 Tuesday 2:15PM Other: - [employer] Anesthetic: (In clinic) Topical Lidocaine  5% applied to wound bed Cellular or Tissue Based Products: Wound #4 Right,Lateral Foot: Cellular or Tissue Based Product Type: - apligraf-applied #1 02/08/2023 apligraf-applied #2 02/14/23 Apligraf- applied #3 02/21/2023 Apligraf- applied #4 02/27/2023 Apligraf #5 applied 03/07/23 Bathing/ Shower/ Hygiene: May shower with protection but do not get wound dressing(s) wet. Protect dressing(s) with water repellant cover (for example, large plastic bag) or a cast cover and may then take shower. Off-Loading: Open toe surgical shoe to: - right foot. wear while walking and standing. minimize pressure and  walking/ standing. WOUND #4: - Foot Wound Laterality: Right, Lateral Cleanser: Soap and Water 1 x Per Day/30 Days Discharge Instructions: May shower and wash wound with dial antibacterial soap and water prior to dressing change. Cleanser: Wound Cleanser 1 x Per Day/30 Days Discharge Instructions: Cleanse the wound with wound cleanser prior to applying a clean dressing using gauze sponges, not tissue or cotton balls. Peri-Wound Care: Skin Prep 1 x Per Day/30 Days Discharge Instructions: Use skin prep as directed Prim Dressing: apligraf #5 1 x Per Day/30 Days ary Discharge Instructions: applied by provider. Prim Dressing: cutimed sorbact swab or adaptic with steristrip 1 x Per Day/30 Days ary Discharge Instructions: leave in place. Secondary Dressing: ABD Pad, 5x9 1 x Per Day/30 Days Discharge Instructions: Apply over primary dressing as directed. Secured With: [employer], 4.5x3.1 (in/yd) 1 x Per Day/30 Days Discharge Instructions: Secure with Kerlix as directed. Secured With: 86M Medipore H Soft Cloth Surgical T ape, 4 x 10 (in/yd) 1 x Per Day/30 Days Discharge Instructions: Secure with tape as directed. LENN, Acelin ([id number]) [id number] Page 10 of 10 03/07/2023: Apparently the wound looks better today. There is tissue coverage of the exposed bone. The depth of the wound has come in overall, but there is an increase in the undermining towards the dorsal part of his foot. There is nonviable tendon exposed. I used a curette to debride slough and subcutaneous tissue from the wound. I used forceps and scissors to cut away the nonviable tendon. Apligraf #5 was prepared, fenestrated, and applied in standard fashion. I made an effort to get the product down into the undermined portions of the wound. Sorbact saturated with hydrogel was used as a bolster. Everything was secured in place with Adaptic and Steri-Strips. He will follow-up in 1 week with Dr.  Rosan. Electronic Signature(s) Signed: 03/07/2023 4:26:08 PM By: Marolyn Nest MD FACS Entered By: Marolyn Nest on 03/07/2023 16:25:24 -------------------------------------------------------------------------------- SuperBill Details Patient Name: Date of Service: [name] 03/07/2023 Medical Record Number: [id number] Patient Account Number: [id number] Date of Birth/Sex: Treating RN: [birth year] (89 y.o. M) Primary Care Provider: Jennelle Riis Other Clinician: Referring Provider: Treating Provider/Extender: Marolyn Nest Jennelle Riis Devra in Treatment: 35 Diagnosis Coding ICD-10 Codes Code Description [phone] Non-pressure chronic ulcer of other part of right foot with necrosis of bone E11.621 Type 2 diabetes mellitus with foot ulcer I70.235 Atherosclerosis of native arteries of right leg with ulceration of other part of foot Facility Procedures : CPT4 Code: [id number] Description: (Facility Use Only) Apligraf 1 SQ CM Modifier: Quantity: 44 : CPT4 Code: [id number] Description: 15275 - SKIN SUB GRAFT FACE/NK/HF/G ICD-10 Diagnosis Description L97.514 Non-pressure chronic ulcer of other part of right foot with necrosis of bone E11.621 Type 2 diabetes mellitus with foot ulcer I70.235 Atherosclerosis of native arteries  of right leg with ulceration of  other part of f Modifier: oot Quantity: 1 Physician Procedures : CPT4 Code Description Modifier [id number] - WC PHYS SKIN SUB GRAFT FACE/NK/HF/G ICD-10 Diagnosis Description L97.514 Non-pressure chronic ulcer of other part of right foot with necrosis of bone E11.621 Type 2 diabetes mellitus with foot ulcer  I70.235 Atherosclerosis of native arteries of right leg with ulceration of other part of foot Quantity: 1 Electronic Signature(s) Signed: 03/07/2023 4:26:08 PM By: Marolyn Nest MD FACS Entered By: Marolyn Nest on 03/07/2023 16:25:51

## 2023-03-08 NOTE — Progress Notes (Signed)
 RADZIEWICZ, Bane ([id number]) [id number] Page 1 of 7 Visit Report for 03/07/2023 Arrival Information Details Patient Name: Date of Service: [name] 03/07/2023 3:00 PM Medical Record Number: [id number] Patient Account Number: [id number] Date of Birth/Sex: Treating [name]: 1933-11-29 (89 y.o. M) Primary Care Jahzion Brogden: Jennelle Riis Other Clinician: Referring Campbell Agramonte: Treating Kaira Stringfield/Extender: [name] Jennelle Riis Devra in Treatment: 35 Visit Information History Since Last Visit Added or deleted any medications: No Patient Arrived: Ambulatory Any new allergies or adverse reactions: No Arrival Time: 15:13 Had a fall or experienced change in No Accompanied [name]: daughter activities of daily living that may affect Transfer Assistance: None risk of falls: Patient Identification Verified: Yes Signs or symptoms of abuse/neglect since last visito No Secondary Verification Process Completed: Yes Hospitalized since last visit: No Patient Requires Transmission-Based Precautions: No Implantable device outside of the clinic excluding No Patient Has Alerts: Yes cellular tissue based products placed in the center Patient Alerts: Patient on Blood Thinner since last visit: Pain Present Now: No Electronic Signature(s) Signed: 03/07/2023 3:31:42 PM [name]: [name]: [name] on 03/07/2023 15:13:43 -------------------------------------------------------------------------------- Encounter Discharge Information Details Patient Name: Date of Service: [name] 03/07/2023 3:00 PM Medical Record Number: [id number] Patient Account Number: [id number] Date of Birth/Sex: Treating [name]: 01-26-1934 (89 y.o. [name] Primary Care Lashelle Koy: Jennelle Riis Other Clinician: Referring Emara Lichter: Treating Bruin Bolger/Extender: [name] Jennelle Riis Devra in Treatment: 74 Encounter Discharge Information Items Post  Procedure Vitals Discharge Condition: Stable Temperature (F): 98 Ambulatory Status: Ambulatory Pulse (bpm): 94 Discharge Destination: Home Respiratory Rate (breaths/min): 18 Transportation: Private Auto Blood Pressure (mmHg): 144/81 Accompanied [name]: daughter Schedule Follow-up Appointment: Yes Clinical Summary of Care: Patient Declined Electronic Signature(s) Signed: 03/07/2023 4:52:13 PM [name]: [name] Entered [name]: [name] on 03/07/2023 16:23:19 Quizhpi, Chetan ([id number]) [id number] Page 2 of 7 -------------------------------------------------------------------------------- Lower Extremity Assessment Details Patient Name: Date of Service: [name] 03/07/2023 3:00 PM Medical Record Number: [id number] Patient Account Number: [id number] Date of Birth/Sex: Treating [name]: 14-Jan-1934 (89 y.o. [name] Primary Care Elloise Roark: Jennelle Riis Other Clinician: Referring Dalaina Tates: Treating Uel Davidow/Extender: [name] Jennelle Riis Devra in Treatment: 35 Edema Assessment Assessed: [Left: No] [Right: No] Edema: [Left: N] [Right: o] Calf Left: Right: Point of Measurement: From Medial Instep 32 cm Ankle Left: Right: Point of Measurement: From Medial Instep 22 cm Vascular Assessment Pulses: Dorsalis Pedis Palpable: [Right:Yes] Extremity colors, hair growth, and conditions: Extremity Color: [Right:Normal] Hair Growth on Extremity: [Right:No] Temperature of Extremity: [Right:Warm] Capillary Refill: [Right:< 3 seconds] Dependent Rubor: [Right:No No] Electronic Signature(s) Signed: 03/07/2023 4:52:13 PM [name]: [name] Entered [name]: [name] on 03/07/2023 15:24:53 -------------------------------------------------------------------------------- Multi Wound Chart Details Patient Name: Date of Service: [name] 03/07/2023 3:00 PM Medical Record Number: [id number] Patient Account  Number: [id number] Date of Birth/Sex: Treating [name]: Apr 24, 1933 (89 y.o. M) Primary Care Jolea Dolle: Jennelle Riis Other Clinician: Referring Shahla Betsill: Treating Ellison Leisure/Extender: [name] Jennelle Riis Devra in Treatment: 35 Vital Signs Height(in): 71 Pulse(bpm): 94 Weight(lbs): 170 Blood Pressure(mmHg): 144/81 Body Mass Index(BMI): 23.7 Temperature(F): Respiratory Rate(breaths/min): 18 [4:Photos:] [N/A:N/A [id number] Page 3 of 7] Right, Lateral Foot N/A N/A Wound Location: Shear/Friction N/A N/A Wounding Event: Diabetic Wound/Ulcer of the Lower N/A N/A Primary Etiology: Extremity Pressure Ulcer N/A N/A Secondary Etiology: Cataracts, Anemia, Asthma, Chronic N/A N/A Comorbid History: Obstructive Pulmonary Disease (COPD), Peripheral Arterial Disease, Type II Diabetes, Gout, Osteoarthritis, Neuropathy 11/21/2022 N/A N/A Date Acquired: 13 N/A N/A Weeks of Treatment:  Open N/A N/A Wound Status: No N/A N/A Wound Recurrence: 1.9x1.6x0.6 N/A N/A Measurements L x W x D (cm) 2.388 N/A N/A A (cm) : rea 1.433 N/A N/A Volume (cm) : 53.90% N/A N/A % Reduction in A rea: -176.60% N/A N/A % Reduction in Volume: 6 Starting Position 1 (o'clock): 9 Ending Position 1 (o'clock): 0.7 Maximum Distance 1 (cm): Yes N/A N/A Undermining: Grade 2 N/A N/A Classification: Medium N/A N/A Exudate A mount: Serosanguineous N/A N/A Exudate Type: red, brown N/A N/A Exudate Color: Distinct, outline attached N/A N/A Wound Margin: Medium (34-66%) N/A N/A Granulation A mount: Red, Pink N/A N/A Granulation Quality: Medium (34-66%) N/A N/A Necrotic A mount: Fat Layer (Subcutaneous Tissue): Yes N/A N/A Exposed Structures: Tendon: Yes Bone: Yes Fascia: No Muscle: No Joint: No Small (1-33%) N/A N/A Epithelialization: Debridement - Excisional N/A N/A Debridement: Pre-procedure Verification/Time Out 15:40 N/A N/A Taken: Lidocaine  4% Topical  Solution N/A N/A Pain Control: Tendon, Subcutaneous, Slough N/A N/A Tissue Debrided: Skin/Subcutaneous Tissue/Muscle N/A N/A Level: 2.39 N/A N/A Debridement A (sq cm): rea Curette N/A N/A Instrument: Minimum N/A N/A Bleeding: Pressure N/A N/A Hemostasis A chieved: Procedure was tolerated well N/A N/A Debridement Treatment Response: 1.9x1.6x0.6 N/A N/A Post Debridement Measurements L x W x D (cm) 1.433 N/A N/A Post Debridement Volume: (cm) Callus: Yes N/A N/A Periwound Skin Texture: Excoriation: No Induration: No Crepitus: No Rash: No Scarring: No Maceration: Yes N/A N/A Periwound Skin Moisture: Dry/Scaly: No Atrophie Blanche: No N/A N/A Periwound Skin Color: Cyanosis: No Ecchymosis: No Erythema: No Hemosiderin Staining: No Mottled: No Pallor: No Rubor: No No Abnormality N/A N/A Temperature: Cellular or Tissue Based Product N/A N/A Procedures Performed: Debridement Treatment Notes Electronic Signature(s) Signed: 03/07/2023 4:26:08 PM [name]: [name] Nest MD FACS [name], [name] [phone]: [name] Nest MD FACS [phone].pdf Page 4 of 7 Signed: 03/07/2023 4:26:08 PM Entered [name]: [name] on 03/07/2023 16:19:37 -------------------------------------------------------------------------------- Multi-Disciplinary Care Plan Details Patient Name: Date of Service: [name] 03/07/2023 3:00 PM Medical Record Number: [id number] Patient Account Number: [id number] Date of Birth/Sex: Treating [name]: 12/16/1933 (89 y.o. [name] Primary Care Josey Forcier: Jennelle Riis Other Clinician: Referring Maurilio Puryear: Treating Leighton Brickley/Extender: [name] Jennelle Riis Devra in Treatment: 35 Active Inactive Wound/Skin Impairment Nursing Diagnoses: Impaired tissue integrity Knowledge deficit related to ulceration/compromised skin integrity Goals: Patient will have a decrease in wound volume [name] X% from date:  (specify in notes) Date Initiated: 07/04/2022 Target Resolution Date: 07/05/2023 Goal Status: Active Patient/caregiver will verbalize understanding of skin care regimen Date Initiated: 07/04/2022 Target Resolution Date: 07/05/2023 Goal Status: Active Ulcer/skin breakdown will have a volume reduction of 30% [name] week 4 Date Initiated: 07/04/2022 Date Inactivated: 10/27/2022 Target Resolution Date: 11/05/2022 Goal Status: Met Ulcer/skin breakdown will have a volume reduction of 50% [name] week 8 Date Initiated: 07/04/2022 Date Inactivated: 10/27/2022 Target Resolution Date: 11/05/2022 Goal Status: Met Interventions: Assess patient/caregiver ability to obtain necessary supplies Assess patient/caregiver ability to perform ulcer/skin care regimen upon admission and as needed Assess ulceration(s) every visit Notes: Electronic Signature(s) Signed: 03/07/2023 4:52:13 PM [name]: [name] Entered [name]: [name] on 03/07/2023 16:20:59 -------------------------------------------------------------------------------- Pain Assessment Details Patient Name: Date of Service: [name] 03/07/2023 3:00 PM Medical Record Number: [id number] Patient Account Number: [id number] Date of Birth/Sex: Treating [name]: 06-30-1933 (89 y.o. M) Primary Care Tanekia Ryans: Jennelle Riis Other Clinician: Referring Athan Casalino: Treating Archer Vise/Extender: [name] Jennelle Riis Devra in Treatment: 29 Active Problems Location of Pain Severity and Description of Pain Patient Has Paino  [name], [name] ([id number]) [id number] Page 5 of 7 Patient Has Paino Yes Site Locations Rate the pain. Current Pain Level: 4 Worst Pain Level: 10 Least Pain Level: 0 Tolerable Pain Level: 2 Character of Pain Describe the Pain: Throbbing Pain Management and Medication Current Pain Management: Electronic Signature(s) Signed: 03/07/2023 3:31:42 PM [name]: [name]: [name] on 03/07/2023 15:14:48 -------------------------------------------------------------------------------- Patient/Caregiver Education Details Patient Name: Date of Service: [name] 12/31/2024andnbsp3:00 PM Medical Record Number: [id number] Patient Account Number: [id number] Date of Birth/Gender: Treating [name]: 11-Nov-1933 (89 y.o. [name] Primary Care Physician: Jennelle Riis Other Clinician: Referring Physician: Treating Physician/Extender: [name] Jennelle Riis Devra in Treatment: 61 Education Assessment Education Provided To: Patient Education Topics Provided Wound/Skin Impairment: Methods: Explain/Verbal Responses: State content correctly Electronic Signature(s) Signed: 03/07/2023 4:52:13 PM [name]: [name] Entered [name]: [name] on 03/07/2023 16:21:33 Wound Assessment Details -------------------------------------------------------------------------------- [name] ([id number]) [id number] Page 6 of 7 Patient Name: Date of Service: [name] 03/07/2023 3:00 PM Medical Record Number: [id number] Patient Account Number: [id number] Date of Birth/Sex: Treating [name]: [birth year] (89 y.o. M) Primary Care Drequan Ironside: Jennelle Riis Other Clinician: Referring Phillippe Orlick: Treating Karielle Davidow/Extender: [name] Jennelle Riis Devra in Treatment: 35 Wound Status Wound Number: 4 Primary Diabetic Wound/Ulcer of the Lower Extremity Etiology: Wound Location: Right, Lateral Foot Secondary Pressure Ulcer Wounding Event: Shear/Friction Etiology: Date Acquired: 11/21/2022 Wound Open Weeks Of Treatment: 13 Status: Clustered Wound: No Comorbid Cataracts, Anemia, Asthma, Chronic Obstructive Pulmonary History: Disease (COPD), Peripheral Arterial Disease, Type II Diabetes, Gout, Osteoarthritis, Neuropathy Photos Wound Measurements Length: (cm) 1.9 % Reduction in Area: 53.9% Width: (cm)  1.6 % Reduction in Volume: -176.6% Depth: (cm) 0.6 Epithelialization: Small (1-33%) Area: (cm) 2.388 Tunneling: No Volume: (cm) 1.433 Undermining: Yes Starting Position (o'clock): 6 Ending Position (o'clock): 9 Maximum Distance: (cm) 0.7 Wound Description Classification: Grade 2 Foul Odor After Cleansing: No Wound Margin: Distinct, outline attached Slough/Fibrino Yes Exudate Amount: Medium Exudate Type: Serosanguineous Exudate Color: red, brown Wound Bed Granulation Amount: Medium (34-66%) Exposed Structure Granulation Quality: Red, Pink Fascia Exposed: No Necrotic Amount: Medium (34-66%) Fat Layer (Subcutaneous Tissue) Exposed: Yes Necrotic Quality: Adherent Slough Tendon Exposed: Yes Muscle Exposed: No Joint Exposed: No Bone Exposed: Yes Periwound Skin Texture Texture Color No Abnormalities Noted: No No Abnormalities Noted: No Callus: Yes Atrophie Blanche: No Crepitus: No Cyanosis: No Excoriation: No Ecchymosis: No Induration: No Erythema: No Rash: No Hemosiderin Staining: No Scarring: No Mottled: No Pallor: No Moisture Rubor: No No Abnormalities Noted: No Dry / Scaly: No Temperature / Pain Maceration: Yes Temperature: No Abnormality Treatment Notes [name], [name] ([id number]) [id number] Page 7 of 7 Wound #4 (Foot) Wound Laterality: Right, Lateral Cleanser Soap and Water Discharge Instruction: May shower and wash wound with dial antibacterial soap and water prior to dressing change. Wound Cleanser Discharge Instruction: Cleanse the wound with wound cleanser prior to applying a clean dressing using gauze sponges, not tissue or cotton balls. Peri-Wound Care Skin Prep Discharge Instruction: Use skin prep as directed Topical Primary Dressing apligraf #5 Discharge Instruction: applied [name] Dynasti Kerman. cutimed sorbact swab or adaptic with steristrip Discharge Instruction: leave in place. Secondary Dressing ABD Pad,  5x9 Discharge Instruction: Apply over primary dressing as directed. Secured With [employer], 4.5x3.1 (in/yd) Discharge Instruction: Secure with Kerlix as directed. 30M Medipore H Soft Cloth Surgical T ape, 4 x 10 (in/yd) Discharge Instruction: Secure with tape as directed. Compression Wrap Compression Stockings Add-Ons Electronic Signature(s) Signed: 03/07/2023 4:52:13 PM  [name]: [name] Entered [name]: [name] on 03/07/2023 15:33:08 -------------------------------------------------------------------------------- Vitals Details Patient Name: Date of Service: [name] 03/07/2023 3:00 PM Medical Record Number: [id number] Patient Account Number: [id number] Date of Birth/Sex: Treating [name]: 1933-04-16 (89 y.o. M) Primary Care Graclyn Lawther: Jennelle Riis Other Clinician: Referring Audri Kozub: Treating Trany Chernick/Extender: [name] Jennelle Riis Devra in Treatment: 35 Vital Signs Time Taken: 03:13 Temperature (F): 98 Height (in): 71 Pulse (bpm): 94 Weight (lbs): 170 Respiratory Rate (breaths/min): 18 Body Mass Index (BMI): 23.7 Blood Pressure (mmHg): 144/81 Reference Range: 80 - 120 mg / dl Electronic Signature(s) Signed: 03/07/2023 4:52:13 PM [name]: [name] Previous Signature: 03/07/2023 3:31:42 PM Version [name]: [name]: [name] on 03/07/2023 16:22:21

## 2023-03-09 ENCOUNTER — Encounter (HOSPITAL_BASED_OUTPATIENT_CLINIC_OR_DEPARTMENT_OTHER): Payer: PPO | Admitting: General Surgery

## 2023-03-10 ENCOUNTER — Encounter (HOSPITAL_BASED_OUTPATIENT_CLINIC_OR_DEPARTMENT_OTHER): Payer: PPO | Admitting: General Surgery

## 2023-03-13 ENCOUNTER — Encounter (HOSPITAL_BASED_OUTPATIENT_CLINIC_OR_DEPARTMENT_OTHER): Payer: PPO | Admitting: Internal Medicine

## 2023-03-14 ENCOUNTER — Encounter (HOSPITAL_BASED_OUTPATIENT_CLINIC_OR_DEPARTMENT_OTHER): Payer: PPO | Admitting: Internal Medicine

## 2023-03-14 ENCOUNTER — Other Ambulatory Visit: Payer: Self-pay

## 2023-03-14 ENCOUNTER — Encounter (HOSPITAL_BASED_OUTPATIENT_CLINIC_OR_DEPARTMENT_OTHER): Payer: PPO | Attending: Internal Medicine | Admitting: Internal Medicine

## 2023-03-14 DIAGNOSIS — E11621 Type 2 diabetes mellitus with foot ulcer: Secondary | ICD-10-CM | POA: Insufficient documentation

## 2023-03-14 DIAGNOSIS — I70235 Atherosclerosis of native arteries of right leg with ulceration of other part of foot: Secondary | ICD-10-CM | POA: Diagnosis not present

## 2023-03-14 DIAGNOSIS — L97514 Non-pressure chronic ulcer of other part of right foot with necrosis of bone: Secondary | ICD-10-CM | POA: Diagnosis not present

## 2023-03-16 LAB — SURGICAL PATHOLOGY

## 2023-03-16 NOTE — Progress Notes (Signed)
 [name], [name] ([id number]) [id number] Page 1 of 11 Visit Report for 03/14/2023 Biopsy Details Patient Name: Date of Service: [name] [name] [name] [name] [name] 03/14/2023 2:15 PM Medical Record Number: [id number] Patient Account Number: [id number] Date of Birth/Sex: Treating RN: [birth year] (89 y.o. M) [name], [name] Primary Care Provider: Jennelle [name] Other Clinician: Referring Provider: Treating Provider/Extender: [name] [name] [name] in Treatment: 36 Biopsy Performed for: Wound #4 Right, Lateral Foot The following information was scribed by: [name], [name] The information was scribed for: [name] Performed By: Physician [name] Harlene, DO Tissue Punch: No Number of Specimens T aken: 1 Level of Consciousness (Pre-procedure): Awake and Alert Pre-procedure Verification/Time-Out Taken: Yes - 15:35 Instrument: Rongeur Hemostasis Achieved: Pressure Response to Treatment: Procedure was tolerated well Level of Consciousness (Post-procedure): Awake and Alert Post Procedure Diagnosis Same as Pre-procedure Electronic Signature(s) Signed: 03/14/2023 4:32:03 PM By: [name], Jamie RN Signed: 03/15/2023 5:26:35 PM By: [name] Harlene DO Entered By: [name], [name] on 03/14/2023 15:40:22 -------------------------------------------------------------------------------- Chief Complaint Document Details Patient Name: Date of Service: [name] [name] [name] [name] [name] 03/14/2023 2:15 PM Medical Record Number: [id number] Patient Account Number: [id number] Date of Birth/Sex: Treating RN: 26-Mar-1933 (89 y.o. M) Primary Care Provider: Jennelle [name] Other Clinician: Referring Provider: Treating Provider/Extender: [name] [name] [name] in Treatment: 68 Information Obtained from: Patient Chief Complaint 07/04/2022; right foot wound Electronic Signature(s) Signed: 03/15/2023 5:26:35 PM By: [name] Harlene DO Entered By: [name] on 03/14/2023  16:20:28 [name], [name] ([id number]) [id number] Page 2 of 11 -------------------------------------------------------------------------------- HPI Details Patient Name: Date of Service: [name] [name] [name] [name] [name] 03/14/2023 2:15 PM Medical Record Number: [id number] Patient Account Number: [id number] Date of Birth/Sex: Treating RN: 1933-04-12 (89 y.o. M) Primary Care Provider: Jennelle [name] Other Clinician: Referring Provider: Treating Provider/Extender: [name] [name] [name] in Treatment: 1 History of Present Illness HPI Description: 07/04/2022 [name] is an 89 year old male with a past medical history of uncontrolled, insulin  dependent type 2 diabetes, CKD stage III, and COPD that presents to the clinic for a right foot wound. He has an extensive history of peripheral arterial disease to the right leg. He has had a right superficial femoral to posterior tibial artery bypass by Dr. Jerome in 2020. He had a right common femoral to plantar posterior tibial artery bypass on 02/03/2022 by Dr. Magda and at that time he also had a right toe amputation. He states this wound site had healed completely. Prior to this he had amputations to the first and second digit to the right foot. He presents today with an open wound to the previous right toe amputation site for the past 2 months. He has been following with vein and vascular for this issue. Most recent ABIs were on 06/22/2022 that showed absent waveforms in the right lower extremity. Currently his right lower extremity bypass is occluded and no plan for another anteriorgram and has he likely has no revascularization options. Currently he has been using Betadine to the wound bed. He denies signs of infection. 07/11/2022 patient arrives in clinic today using Hydrofera Blue on the toe amputation site. The patient is revascularized and is not felt to have any other options for revascularization.  We are using Hydrofera Blue and gauze. 5/13; patient presents for follow-up. He has been using Hydrofera Blue to the toe amputation site. He has had no issues. Wound is smaller. He has been approved for [facility] and was agreeable to have this placed in office today. 5/20; patient presents for follow-up. We have been using Kerecis  to the wound bed. There is been improvement in healing. Unfortunately patient did not change the outer dressing for a week. There is slight maceration to the periwound. Unclear why he did not do this. 5/30; patient presents for follow-up. We have been using Kerecis to the wound bed. More maceration to the periwound today. No signs of infection. 6/10; patient presents for follow-up. He has been using Hydrofera Blue to the wound bed. He has no issues or complaints today. Maceration has almost completely resolved. 6/18; patient presents for follow-up. He has been using blast X and collagen to the wound bed without issues. Wound appears smaller today. 6/28; patient presents for follow-up. He has been using blast X and collagen to the wound bed. The wound is smaller. 7/11; patient presents for follow-up. He has been using blast X and collagen to the wound bed. Wound is smaller. He has slough accumulation. 7/25; patient presents for follow-up. He has been using blast X and collagen to the wound bed. Wound is stable. He denies signs of infection. 8/26; patient presents for follow-up. He has been using blast X and collagen to the wound bed. Wound is slightly smaller. He has no issues or complaints. 8/29; patient presents for follow-up. He has been using blast X and Aquacel Ag to the wound bed. He has been using a Velcro wrap at his toes to keep the toes closer together to help with wound healing. He has no issues or complaints today. Wound is smaller. 9/12; patient presents for follow-up. He has been using blast X and Aquacel Ag to the wound bed. Unfortunately he has developed a  pressure injury to the lateral aspect of the foot. He is not sure how this started. No open wound there. He denies pain. 9/23; this patient has a wound on the right foot at the amputation site of her third toe. It is right up against the fourth toe making it somewhat difficult to dress. When he was here last time he had a blister/DTI on the lateral aspect of the fifth MTP 9/30; patient has a wound on the right foot amputation site of his third toe. Probably in the third fourth webspace. I changed him to endoform last week. The wound looks somewhat better The area over the lateral part of the fifth metatarsal head which was blistered with some central deep tissue looking injury was leaking fluid today. The blister was unroofed. I used pickups and scissors to remove this. I do not know that there is anything technically open here although there is certainly some central tissue at risk 10/7; his original wound at the amputation site in his third fourth webspace looks stable to improved. We have been using endoform. The real concern today is the area over the lateral part of the fifth metatarsal head. This initially looked like a blister with central deep tissue injury I unroofed the blister last week there was nothing technically open but the deep tissue injury was concerning. T oday he comes in with a necrotic surface I remove this. The central part of this area which I am assuming was the deep tissue injury is necrotic I removed some of the necrotic tissue with a #15 scalpel and pickups. There is undermining around the wound edge here. 10/14; his original wound at the amputation site of his third fourth toe webspace looks improved we have gradually been making progress here. Using endoform. Unfortunately the area over the lateral part of the right fifth metatarsal head continues to deteriorate.  Necrotic surface last week continues this week. The PCR culture I did last week did not manage to make it  to the right lab. I have looked at his x-ray the bone underneath does not look that bad to me however we have not yet had a radiologist interpretation. We have been using Iodoflex this week. 10/21; the original amputation site of his third fourth toe webspace continues to look reasonably healthy and making slow but steady progress we have been using endoform The area on the right lateral fifth metatarsal head has been deteriorating problem. PCR culture grew MRSA. I have him on doxycycline . There is exposed bone in the wound under necrotic material. I still do not have the x-ray back from October 7 at least in terms of the report. Addendum 12/27/2022; I have looked back on this patient's vascular status. He is status post a right common femoral to posterior tibial artery bypass in 2020. He had a right common femoral to plantar posterior tibial bypass by Dr. Magda in November 2023. He also had a right third toe amputation at the same time. The wound that we have been dealing with the third toe amputation site was present after the surgery. He was last seen by vascular surgery on 06/21/2022 and was supposed to follow-up with Dr. Magda in 3 weeks although I do not see that appointment. I have included the major findings in the last right lower extremity angiogram on 01/21/2022 10/28;SABRA The x-ray report that I ordered several weeks ago is back. Indeed there was nothing under the fifth met head on the right however notable for bone [name], [name] ([id number]) [id number] Page 3 of 11 necrosis under the wound on the left third toe. This suggest osteomyelitis. I am going to extend his doxycycline  for 3 further weeks. PCR culture on the lateral foot grew MRSA I sent a secure text message to Dr. Vonzell. His reply was that there was no additional options for revascularization. The patient went to see Dr. Vonzell and that is the message they came up with. He did offer him an  amputation [o Above-knee]. We have been using endoform to the third toe amputation site. Silver alginate to the new wound over the fifth metatarsal head 11/4; patient continues on doxycycline  which she is handling well. He started hyperbaric oxygen  today and tolerated the first treatment well. We have been using endoform to the third toe amputation site which was the original wound he came to the clinic for. We are using Santyl and silver alginate to the wound over the fifth metatarsal head which was the wound that opened while he was under observation. Culture grew MRSA and the wound over the fifth metatarsal head 11/11 continuing doxycycline . He is tolerating hyperbarics although I am continually notified about tachycardia in the 100-110 range. Looking back on EKGs shows this is a longstanding problem sinus tachycardia. He also has left anterior fascicular block. He does not describe chest pain The original wound on the third toe amputation site looks a lot better this is contracting however the area on the right lateral fifth metatarsal head only has a rim of height of granulation tissue around what looks to be periosteum 11/18; continuing doxycycline . He is tolerating hyperbarics. The original wound on the third toe amputation site is very small and healthy however the wound the developed on the right lateral fifth met head which probably was a combination of infection and ischemic has a nonviable surface. We have been using Hydrofera Blue and  Santyl he continues on doxycycline . The doxycycline  was same that the x-ray from 10/31 showing high-grade phalangeal bone erosion suspicious for osteomyelitis. At that time there was nothing demonstrated on the fifth met head. I have repeated the x-ray of the foot looking specifically at the fifth met head however the report is not available. 12/3; the patient's original wound on the amputation site of his third toe is closed overall though it is a slight  surface eschar I did not remove this. The problematic area on the right lateral fifth metatarsal head remains the major issue here. He is not a candidate for further revascularization. He has what looks to be some viable granulation over the bone which is an improvement. However extending into the superior part of his foot a large open area over the bone itself. We used Apligraf #1 today. He continues with HBO 12/10; the original wound on the amputation site is eschared over. There is probably a small open area remaining here but I did not debride this. The original osteomyelitis was in this area. The subsequent wound is over the right lateral fifth metatarsal head. We applied Apligraf #1 last week. The wound has improved. The option for this patient was amputation at some level. He thinks he was told that this would be above-knee certainly complicating to decide in that direction. Fortunately his wound looks somewhat better today. No obvious infection 12/17; his original wound on the amputation site of his third toe is still eschared over but I have not disturb this. The original osteomyelitis that we are treating him for was in this area. The subsequent wound is over the right lateral fifth met head. This was a combination of ischemic and infected wound that developed rapidly from 1 week to the next in this clinic. 12/23; his original wound on the amputation site of his third toe is healed. This is the area with underlying osteomyelitis that he completed hyperbaric oxygen  for on Friday. During the course of his stay however he developed a rapidly progressive wound on the right lateral fifth metatarsal head which was a combination of ischemia probably with coexistent infection. I gave him 6 weeks of doxycycline . We extended HBO. And I have been applying Apligraf to this area [#4 to this area today]. We have managed to get the wound to contract however there is still precious little if any tissue over  this bone which in itself does not feel completely viable. He is not a candidate for a ray amputation secondary to known PAD. I verified this with vein and vascular Dr. Vonzell. 03/07/2023: Apparently the wound looks better today. There is tissue coverage of the exposed bone. The depth of the wound has come in overall, but there is an increase in the undermining towards the dorsal part of his foot. There is nonviable tendon exposed. 03/14/2023; Patient presents for follow-up. The lateral right foot wound has significantly declined in appearance. Bone is exposed and nonviable. There is mild odor and patient reports increased pain. Apligraf was placed in standard fashion at last clinic visit. Electronic Signature(s) Signed: 03/15/2023 5:26:35 PM By: [name] Raisin DO Entered By: [name] Raisin on 03/15/2023 16:46:49 -------------------------------------------------------------------------------- Physical Exam Details Patient Name: Date of Service: [name] [name] [name] [name] [name] 03/14/2023 2:15 PM Medical Record Number: [id number] Patient Account Number: [id number] Date of Birth/Sex: Treating RN: [birth year] (89 y.o. M) Primary Care Provider: Jennelle [name] Other Clinician: Referring Provider: Treating Provider/Extender: [name] Raisin [name] [name] in Treatment: 35 Constitutional respirations regular, non-labored and  within target range for patient.. Cardiovascular 2+ dorsalis pedis/posterior tibialis pulses. [name], [name] ([id number]) [id number] Page 4 of 11 Psychiatric pleasant and cooperative. Notes T the lateral right foot there is exposed bone that appears nonviable. Unhealthy granulation tissue surrounding this. No purulent drainage. No obvious signs of o soft tissue infection. Increased pain on palpation. Electronic Signature(s) Signed: 03/15/2023 5:26:35 PM By: [name] Raisin DO Entered By: [name] Raisin on 03/15/2023  16:48:17 -------------------------------------------------------------------------------- Physician Orders Details Patient Name: Date of Service: [name] [name] [name] [name] [name] 03/14/2023 2:15 PM Medical Record Number: [id number] Patient Account Number: [id number] Date of Birth/Sex: Treating RN: January 29, 1934 (89 y.o. M) [name], [name] Primary Care Provider: Jennelle [name] Other Clinician: Referring Provider: Treating Provider/Extender: [name] Raisin [name] [name] in Treatment: 67 Verbal / Phone Orders: No Diagnosis Coding Follow-up Appointments ppointment in 1 week. - Dr. Rosan Return A Other: - [employer] Anesthetic (In clinic) Topical Lidocaine  5% applied to wound bed Cellular or Tissue Based Products Wound #4 Right,Lateral Foot Cellular or Tissue Based Product Type: - apligraf-applied #1 02/08/2023 apligraf-applied #2 02/14/23 Apligraf- applied #3 02/21/2023 Apligraf- applied #4 02/27/2023 Apligraf #5 applied 03/07/23 Bathing/ Shower/ Hygiene May shower with protection but do not get wound dressing(s) wet. Protect dressing(s) with water repellant cover (for example, large plastic bag) or a cast cover and may then take shower. Off-Loading Open toe surgical shoe to: - right foot. wear while walking and standing. minimize pressure and walking/ standing. Hyperbaric Oxygen  Therapy Wound #4 Right,Lateral Foot Evaluate for HBO Therapy - Wag grade 3 2.0 ATA for 90 Minutes with 2 Five (5) Minute A Breaks ir Total Number of Treatments: - 40 treatments One treatments per day (delivered Monday through Friday unless otherwise specified in Special Instructions below): Finger stick Blood Glucose Pre- and Post- HBOT Treatment. Follow Hyperbaric Oxygen  Glycemia Protocol Give two 4oz orange juices in addition to Glucerna when the glycemic protocol is used. Wound Treatment Wound #4 - Foot Wound Laterality: Right, Lateral Cleanser: Soap and Water 1 x Per Day/30 Days Discharge  Instructions: May shower and wash wound with dial antibacterial soap and water prior to dressing change. Cleanser: Vashe 5.8 (oz) 1 x Per Day/30 Days Discharge Instructions: wet to dry with VASHE Cleanser: Wound Cleanser 1 x Per Day/30 Days Discharge Instructions: Cleanse the wound with wound cleanser prior to applying a clean dressing using gauze sponges, not tissue or cotton balls. Peri-Wound Care: Skin Prep 1 x Per Day/30 Days Discharge Instructions: Use skin prep as directed [name], [name] ([id number]) [id number] Page 5 of 11 Prim Dressing: wet to dry Vashe ary 1 x Per Day/30 Days Secondary Dressing: ABD Pad, 5x9 1 x Per Day/30 Days Discharge Instructions: Apply over primary dressing as directed. Secured With: [employer], 4.5x3.1 (in/yd) 1 x Per Day/30 Days Discharge Instructions: Secure with Kerlix as directed. Secured With: 63M Medipore H Soft Cloth Surgical T ape, 4 x 10 (in/yd) 1 x Per Day/30 Days Discharge Instructions: Secure with tape as directed. Laboratory Bacteria identified in Tissue by Biopsy culture (MICRO) - Bone Biopsy LOINC Code: 20474-3 Convenience Name: Biopsy specimen culture Patient Medications llergies: No Known Allergies A Notifications Medication Indication Start End 03/14/2023 amoxicillin-pot clavulanate DOSE 1 - oral 875 mg-125 mg tablet - 1 tablet oral twice a day x 14 days 03/14/2023 doxycycline  hyclate DOSE 1 - oral 100 mg tablet - 1 tablet oral twice a day x 14 days GLYCEMIA INTERVENTIONS PROTOCOL PRE-HBO GLYCEMIA INTERVENTIONS ACTION INTERVENTION Obtain pre-HBO capillary blood glucose (ensure 1 physician order  is in chart). A. Notify HBO physician and await physician orders. 2 If result is 70 mg/dl or below: B. If the result meets the hospital definition of a critical result, follow hospital policy. A. Give patient an 8 ounce Glucerna Shake, an 8 ounce Ensure, or 8 ounces of a Glucerna/Ensure equivalent  dietary supplement*. B. Wait 30 minutes. If result is 71 mg/dl to 869 mg/dl: C. Retest patients capillary blood glucose (CBG). D. If result greater than or equal to 110 mg/dl, proceed with HBO. If result less than 110 mg/dl, notify HBO physician and consider holding HBO. If result is 131 mg/dl to 750 mg/dl: A. Proceed with HBO. A. Notify HBO physician and await physician orders. B. It is recommended to hold HBO and do If result is 250 mg/dl or greater: blood/urine ketone testing. C. If the result meets the hospital definition of a critical result, follow hospital policy. POST-HBO GLYCEMIA INTERVENTIONS ACTION INTERVENTION Obtain post HBO capillary blood glucose (ensure 1 physician order is in chart). A. Notify HBO physician and await physician orders. 2 If result is 70 mg/dl or below: B. If the result meets the hospital definition of a critical result, follow hospital policy. A. Give patient an 8 ounce Glucerna Shake, an 8 ounce Ensure, or 8 ounces of a Glucerna/Ensure equivalent dietary supplement*. B. Wait 15 minutes for symptoms of If result is 71 mg/dl to 899 mg/dl: hypoglycemia (i.e. nervousness, anxiety, sweating, chills, clamminess, irritability, confusion, tachycardia or dizziness). C. If patient asymptomatic, discharge patient. If patient symptomatic, repeat capillary blood glucose (CBG) and notify HBO physician. If result is 101 mg/dl to 750 mg/dl: A. Discharge patient. A. Notify HBO physician and await physician orders. B. It is recommended to do blood/urine ketone If result is 250 mg/dl or greater: testing. C. If the result meets the hospital definition of a [name], [name] ([id number]) [id number] Page 6 of 11 critical result, follow hospital policy. *Juice or candies are NOT equivalent products. If patient refuses the Glucerna or Ensure, please consult the hospital dietitian for an appropriate substitute. Electronic  Signature(s) Signed: 03/15/2023 5:26:35 PM By: [name] Raisin DO Previous Signature: 03/14/2023 3:40:38 PM Version By: [name] Raisin DO Entered By: [name] Raisin on 03/15/2023 16:48:32 Prescription 03/14/2023 -------------------------------------------------------------------------------- ESTELLE ARLYN [name] Raisin DO Patient Name: Provider: Dec 31, 1933 [id number] Date of Birth: NPI#: [id number] Sex: DEA #: [phone] Phone #: License #: UPN: Patient Address: [address] [facility] [place], [place] [address] [address] [place], [place] [address] [phone] Allergies No Known Allergies Provider's Orders Bacteria identified in Tissue by Biopsy culture - Bone Biopsy LOINC Code: 20474-3 Convenience Name: Biopsy specimen culture Hand Signature: Date(s): Electronic Signature(s) Signed: 03/15/2023 5:26:35 PM By: [name] Raisin DO Entered By: [name] Raisin on 03/15/2023 16:48:33 -------------------------------------------------------------------------------- Problem List Details Patient Name: Date of Service: [name] [name] [name] [name] [name] 03/14/2023 2:15 PM Medical Record Number: [id number] Patient Account Number: [id number] Date of Birth/Sex: Treating RN: 1933/04/25 (89 y.o. M) Primary Care Provider: Jennelle [name] Other Clinician: Referring Provider: Treating Provider/Extender: [name] Raisin [name] [name] in Treatment: 21 Active Problems ICD-10 Encounter Code Description Active Date MDM Diagnosis L97.514 Non-pressure chronic ulcer of other part of right foot with necrosis of bone 12/26/2022 No Yes [name], [name] ([id number]) [id number] Page 7 of 11 E11.621 Type 2 diabetes mellitus with foot ulcer 07/04/2022 No Yes I70.235 Atherosclerosis of native arteries of right leg with ulceration of other part of 07/04/2022 No Yes foot Inactive  Problems ICD-10  Code Description Active Date Inactive Date L97.512 Non-pressure chronic ulcer of other part of right foot with fat layer exposed 07/04/2022 07/04/2022 Resolved Problems Electronic Signature(s) Signed: 03/15/2023 5:26:35 PM By: [name] Raisin DO Entered By: [name] Raisin on 03/14/2023 16:20:14 -------------------------------------------------------------------------------- Progress Note Details Patient Name: Date of Service: [name] [name] [name] [name] [name] 03/14/2023 2:15 PM Medical Record Number: [id number] Patient Account Number: [id number] Date of Birth/Sex: Treating RN: April 09, 1933 (89 y.o. M) Primary Care Provider: Jennelle [name] Other Clinician: Referring Provider: Treating Provider/Extender: [name] Raisin [name] [name] in Treatment: 59 Subjective Chief Complaint Information obtained from Patient 07/04/2022; right foot wound History of Present Illness (HPI) 07/04/2022 [name] is an 89 year old male with a past medical history of uncontrolled, insulin  dependent type 2 diabetes, CKD stage III, and COPD that presents to the clinic for a right foot wound. He has an extensive history of peripheral arterial disease to the right leg. He has had a right superficial femoral to posterior tibial artery bypass by Dr. Jerome in 2020. He had a right common femoral to plantar posterior tibial artery bypass on 02/03/2022 by Dr. Magda and at that time he also had a right toe amputation. He states this wound site had healed completely. Prior to this he had amputations to the first and second digit to the right foot. He presents today with an open wound to the previous right toe amputation site for the past 2 months. He has been following with vein and vascular for this issue. Most recent ABIs were on 06/22/2022 that showed absent waveforms in the right lower extremity. Currently his right lower extremity bypass is occluded and no plan for another anteriorgram and has  he likely has no revascularization options. Currently he has been using Betadine to the wound bed. He denies signs of infection. 07/11/2022 patient arrives in clinic today using Hydrofera Blue on the toe amputation site. The patient is revascularized and is not felt to have any other options for revascularization. We are using Hydrofera Blue and gauze. 5/13; patient presents for follow-up. He has been using Hydrofera Blue to the toe amputation site. He has had no issues. Wound is smaller. He has been approved for [facility] and was agreeable to have this placed in office today. 5/20; patient presents for follow-up. We have been using Kerecis to the wound bed. There is been improvement in healing. Unfortunately patient did not change the outer dressing for a week. There is slight maceration to the periwound. Unclear why he did not do this. 5/30; patient presents for follow-up. We have been using Kerecis to the wound bed. More maceration to the periwound today. No signs of infection. 6/10; patient presents for follow-up. He has been using Hydrofera Blue to the wound bed. He has no issues or complaints today. Maceration has almost completely resolved. 6/18; patient presents for follow-up. He has been using blast X and collagen to the wound bed without issues. Wound appears smaller today. 6/28; patient presents for follow-up. He has been using blast X and collagen to the wound bed. The wound is smaller. 7/11; patient presents for follow-up. He has been using blast X and collagen to the wound bed. Wound is smaller. He has slough accumulation. 7/25; patient presents for follow-up. He has been using blast X and collagen to the wound bed. Wound is stable. He denies signs of infection. KUIKEN, Kaydin ([id number]) [id number] Page 8 of 11 8/26; patient presents for follow-up. He has been using blast X and collagen to the wound bed.  Wound is slightly smaller. He has no issues or  complaints. 8/29; patient presents for follow-up. He has been using blast X and Aquacel Ag to the wound bed. He has been using a Velcro wrap at his toes to keep the toes closer together to help with wound healing. He has no issues or complaints today. Wound is smaller. 9/12; patient presents for follow-up. He has been using blast X and Aquacel Ag to the wound bed. Unfortunately he has developed a pressure injury to the lateral aspect of the foot. He is not sure how this started. No open wound there. He denies pain. 9/23; this patient has a wound on the right foot at the amputation site of her third toe. It is right up against the fourth toe making it somewhat difficult to dress. When he was here last time he had a blister/DTI on the lateral aspect of the fifth MTP 9/30; patient has a wound on the right foot amputation site of his third toe. Probably in the third fourth webspace. I changed him to endoform last week. The wound looks somewhat better The area over the lateral part of the fifth metatarsal head which was blistered with some central deep tissue looking injury was leaking fluid today. The blister was unroofed. I used pickups and scissors to remove this. I do not know that there is anything technically open here although there is certainly some central tissue at risk 10/7; his original wound at the amputation site in his third fourth webspace looks stable to improved. We have been using endoform. The real concern today is the area over the lateral part of the fifth metatarsal head. This initially looked like a blister with central deep tissue injury I unroofed the blister last week there was nothing technically open but the deep tissue injury was concerning. T oday he comes in with a necrotic surface I remove this. The central part of this area which I am assuming was the deep tissue injury is necrotic I removed some of the necrotic tissue with a #15 scalpel and pickups. There is undermining  around the wound edge here. 10/14; his original wound at the amputation site of his third fourth toe webspace looks improved we have gradually been making progress here. Using endoform. Unfortunately the area over the lateral part of the right fifth metatarsal head continues to deteriorate. Necrotic surface last week continues this week. The PCR culture I did last week did not manage to make it to the right lab. I have looked at his x-ray the bone underneath does not look that bad to me however we have not yet had a radiologist interpretation. We have been using Iodoflex this week. 10/21; the original amputation site of his third fourth toe webspace continues to look reasonably healthy and making slow but steady progress we have been using endoform The area on the right lateral fifth metatarsal head has been deteriorating problem. PCR culture grew MRSA. I have him on doxycycline . There is exposed bone in the wound under necrotic material. I still do not have the x-ray back from October 7 at least in terms of the report. Addendum 12/27/2022; I have looked back on this patient's vascular status. He is status post a right common femoral to posterior tibial artery bypass in 2020. He had a right common femoral to plantar posterior tibial bypass by Dr. Magda in November 2023. He also had a right third toe amputation at the same time. The wound that we have been dealing  with the third toe amputation site was present after the surgery. He was last seen by vascular surgery on 06/21/2022 and was supposed to follow-up with Dr. Magda in 3 weeks although I do not see that appointment. I have included the major findings in the last right lower extremity angiogram on 01/21/2022 10/28;SABRA The x-ray report that I ordered several weeks ago is back. Indeed there was nothing under the fifth met head on the right however notable for bone necrosis under the wound on the left third toe. This suggest osteomyelitis. I am  going to extend his doxycycline  for 3 further weeks. PCR culture on the lateral foot grew MRSA I sent a secure text message to Dr. Vonzell. His reply was that there was no additional options for revascularization. The patient went to see Dr. Vonzell and that is the message they came up with. He did offer him an amputation [o Above-knee]. We have been using endoform to the third toe amputation site. Silver alginate to the new wound over the fifth metatarsal head 11/4; patient continues on doxycycline  which she is handling well. He started hyperbaric oxygen  today and tolerated the first treatment well. We have been using endoform to the third toe amputation site which was the original wound he came to the clinic for. We are using Santyl and silver alginate to the wound over the fifth metatarsal head which was the wound that opened while he was under observation. Culture grew MRSA and the wound over the fifth metatarsal head 11/11 continuing doxycycline . He is tolerating hyperbarics although I am continually notified about tachycardia in the 100-110 range. Looking back on EKGs shows this is a longstanding problem sinus tachycardia. He also has left anterior fascicular block. He does not describe chest pain The original wound on the third toe amputation site looks a lot better this is contracting however the area on the right lateral fifth metatarsal head only has a rim of height of granulation tissue around what looks to be periosteum 11/18; continuing doxycycline . He is tolerating hyperbarics. The original wound on the third toe amputation site is very small and healthy however the wound the developed on the right lateral fifth met head which probably was a combination of infection and ischemic has a nonviable surface. We have been using Hydrofera Blue and Santyl he continues on doxycycline . The doxycycline  was same that the x-ray from 10/31 showing high-grade phalangeal bone erosion suspicious for  osteomyelitis. At that time there was nothing demonstrated on the fifth met head. I have repeated the x-ray of the foot looking specifically at the fifth met head however the report is not available. 12/3; the patient's original wound on the amputation site of his third toe is closed overall though it is a slight surface eschar I did not remove this. The problematic area on the right lateral fifth metatarsal head remains the major issue here. He is not a candidate for further revascularization. He has what looks to be some viable granulation over the bone which is an improvement. However extending into the superior part of his foot a large open area over the bone itself. We used Apligraf #1 today. He continues with HBO 12/10; the original wound on the amputation site is eschared over. There is probably a small open area remaining here but I did not debride this. The original osteomyelitis was in this area. The subsequent wound is over the right lateral fifth metatarsal head. We applied Apligraf #1 last week. The wound has improved. The  option for this patient was amputation at some level. He thinks he was told that this would be above-knee certainly complicating to decide in that direction. Fortunately his wound looks somewhat better today. No obvious infection 12/17; his original wound on the amputation site of his third toe is still eschared over but I have not disturb this. The original osteomyelitis that we are treating him for was in this area. The subsequent wound is over the right lateral fifth met head. This was a combination of ischemic and infected wound that developed rapidly from 1 week to the next in this clinic. [name], [name] ([id number]) [id number] Page 9 of 11 12/23; his original wound on the amputation site of his third toe is healed. This is the area with underlying osteomyelitis that he completed hyperbaric oxygen  for on Friday. During the course of  his stay however he developed a rapidly progressive wound on the right lateral fifth metatarsal head which was a combination of ischemia probably with coexistent infection. I gave him 6 weeks of doxycycline . We extended HBO. And I have been applying Apligraf to this area [#4 to this area today]. We have managed to get the wound to contract however there is still precious little if any tissue over this bone which in itself does not feel completely viable. He is not a candidate for a ray amputation secondary to known PAD. I verified this with vein and vascular Dr. Vonzell. 03/07/2023: Apparently the wound looks better today. There is tissue coverage of the exposed bone. The depth of the wound has come in overall, but there is an increase in the undermining towards the dorsal part of his foot. There is nonviable tendon exposed. 03/14/2023; Patient presents for follow-up. The lateral right foot wound has significantly declined in appearance. Bone is exposed and nonviable. There is mild odor and patient reports increased pain. Apligraf was placed in standard fashion at last clinic visit. Objective Constitutional respirations regular, non-labored and within target range for patient.. Vitals Time Taken: 2:30 PM, Height: 71 in, Weight: 170 lbs, BMI: 23.7, Temperature: 97.8 F, Pulse: 57 bpm, Respiratory Rate: 16 breaths/min, Blood Pressure: 143/75 mmHg. Cardiovascular 2+ dorsalis pedis/posterior tibialis pulses. Psychiatric pleasant and cooperative. General Notes: T the lateral right foot there is exposed bone that appears nonviable. Unhealthy granulation tissue surrounding this. No purulent drainage. No o obvious signs of soft tissue infection. Increased pain on palpation. Integumentary (Hair, Skin) Wound #4 status is Open. Original cause of wound was Shear/Friction. The date acquired was: 11/21/2022. The wound has been in treatment 14 weeks. The wound is located on the Right,Lateral Foot. The wound  measures 1.9cm length x 1.7cm width x 0.4cm depth; 2.537cm^2 area and 1.015cm^3 volume. There is bone, tendon, and Fat Layer (Subcutaneous Tissue) exposed. Tunneling has been noted at :00. Undermining begins at 6:00 and ends at 8:00 with a maximum distance of 0.6cm. There is additional undermining and at 2:00 and ends at 4:00 with a maximum distance of 0.4cm. There is a medium amount of serosanguineous drainage noted. The wound margin is distinct with the outline attached to the wound base. There is medium (34-66%) red, pink granulation within the wound bed. There is a medium (34-66%) amount of necrotic tissue within the wound bed including Adherent Slough. The periwound skin appearance exhibited: Callus, Maceration, Rubor. The periwound skin appearance did not exhibit: Crepitus, Excoriation, Induration, Rash, Scarring, Dry/Scaly, Atrophie Blanche, Cyanosis, Ecchymosis, Hemosiderin Staining, Mottled, Pallor, Erythema. Periwound temperature was noted as No Abnormality. Assessment Active Problems ICD-10  Non-pressure chronic ulcer of other part of right foot with necrosis of bone Type 2 diabetes mellitus with foot ulcer Atherosclerosis of native arteries of right leg with ulceration of other part of foot I last saw the patient 3 months ago. He has been following with Dr. Rufus. Patient's wound has declined significantly since last clinic visit. Patient's right lateral foot wound has exposed bone now. A bone biopsy was obtained along with a culture T oday. Upon review of his x-ray done on 01/26/2023 it was noted that he had cortical disruption of the fifth metatarsal distally. Patient had been doing hyperbaric oxygen  therapy For Wagner grade 3 and was doing well with this however after completing treatments he was not extended despite having a wound.. It is unclear why. Due to increased pain and odor I went ahead and started the patient on Augmentin and doxycycline . For the dressing I recommended Vashe  wet-to-dry. Follow-up in 1 week. Procedures Wound #4 Pre-procedure diagnosis of Wound #4 is a Diabetic Wound/Ulcer of the Lower Extremity located on the Right, Lateral Foot . There was a biopsy performed by [name] Raisin, DO. The skin was cleansed and prepped with anti-septic. Tissue was removed at its base with the following instrument(s): Rongeur. A time out was conducted at 15:35, prior to the start of the procedure. The procedure was tolerated well. Post procedure Diagnosis Wound #4: Same as Pre-Procedure Plan [name], [name] ([id number]) [id number] Page 10 of 11 Follow-up Appointments: Return Appointment in 1 week. - Dr. Rosan Other: - [employer] Anesthetic: (In clinic) Topical Lidocaine  5% applied to wound bed Cellular or Tissue Based Products: Wound #4 Right,Lateral Foot: Cellular or Tissue Based Product Type: - apligraf-applied #1 02/08/2023 apligraf-applied #2 02/14/23 Apligraf- applied #3 02/21/2023 Apligraf- applied #4 02/27/2023 Apligraf #5 applied 03/07/23 Bathing/ Shower/ Hygiene: May shower with protection but do not get wound dressing(s) wet. Protect dressing(s) with water repellant cover (for example, large plastic bag) or a cast cover and may then take shower. Off-Loading: Open toe surgical shoe to: - right foot. wear while walking and standing. minimize pressure and walking/ standing. Hyperbaric Oxygen  Therapy: Wound #4 Right,Lateral Foot: Evaluate for HBO Therapy - Wag grade 3 2.0 ATA for 90 Minutes with 2 Five (5) Minute Air Breaks T Number of Treatments: - 40 treatments otal One treatments per day (delivered Monday through Friday unless otherwise specified in Special Instructions below): Finger stick Blood Glucose Pre- and Post- HBOT Treatment. Follow Hyperbaric Oxygen  Glycemia Protocol Give two 4oz orange juices in addition to Glucerna when the glycemic protocol is used. Laboratory ordered were: Biopsy specimen  culture - Bone Biopsy The following medication(s) was prescribed: amoxicillin-pot clavulanate oral 875 mg-125 mg tablet 1 1 tablet oral twice a day x 14 days starting 03/14/2023 doxycycline  hyclate oral 100 mg tablet 1 1 tablet oral twice a day x 14 days starting 03/14/2023 WOUND #4: - Foot Wound Laterality: Right, Lateral Cleanser: Soap and Water 1 x Per Day/30 Days Discharge Instructions: May shower and wash wound with dial antibacterial soap and water prior to dressing change. Cleanser: Vashe 5.8 (oz) 1 x Per Day/30 Days Discharge Instructions: wet to dry with VASHE Cleanser: Wound Cleanser 1 x Per Day/30 Days Discharge Instructions: Cleanse the wound with wound cleanser prior to applying a clean dressing using gauze sponges, not tissue or cotton balls. Peri-Wound Care: Skin Prep 1 x Per Day/30 Days Discharge Instructions: Use skin prep as directed Prim Dressing: wet to dry Vashe 1 x Per Day/30 Days ary Secondary  Dressing: ABD Pad, 5x9 1 x Per Day/30 Days Discharge Instructions: Apply over primary dressing as directed. Secured With: [employer], 4.5x3.1 (in/yd) 1 x Per Day/30 Days Discharge Instructions: Secure with Kerlix as directed. Secured With: 61M Medipore H Soft Cloth Surgical T ape, 4 x 10 (in/yd) 1 x Per Day/30 Days Discharge Instructions: Secure with tape as directed. 1. Bone biopsy 2. Culture of the bone 3. Vashe wet-to-dry dressings 4. Augmentin and doxycycline  5. Restart hyperbaric oxygen  therapy Electronic Signature(s) Signed: 03/15/2023 5:26:35 PM By: [name] Raisin DO Entered By: [name] Raisin on 03/15/2023 16:54:12 -------------------------------------------------------------------------------- SuperBill Details Patient Name: Date of Service: [name] [name] [name] [name] [name] 03/14/2023 Medical Record Number: [id number] Patient Account Number: [id number] Date of Birth/Sex: Treating RN: 1934-01-07 (89 y.o. M) Primary Care Provider: Jennelle [name] Other  Clinician: Referring Provider: Treating Provider/Extender: [name] Raisin [name] [name] in Treatment: 36 Diagnosis Coding ICD-10 Codes Code Description [id number] Non-pressure chronic ulcer of other part of right foot with necrosis of bone E11.621 Type 2 diabetes mellitus with foot ulcer Serpa, Degan ([id number]) [id number] Page 11 of 11 I70.235 Atherosclerosis of native arteries of right leg with ulceration of other part of foot Facility Procedures : CPT4 Code: 63899983 Description: 11044 - DEB BONE 20 SQ CM/< ICD-10 Diagnosis Description L97.514 Non-pressure chronic ulcer of other part of right foot with necrosis of bone E11.621 Type 2 diabetes mellitus with foot ulcer Modifier: Quantity: 1 Physician Procedures : CPT4: Description Modifier Code [id number] - WC PHYS LEVEL 4 - EST PT 25 ICD-10 Diagnosis Description L97.514 Non-pressure chronic ulcer of other part of right foot with necrosis of bone E11.621 Type 2 diabetes mellitus with foot ulcer I70.235  Atherosclerosis of native arteries of right leg with ulceration of other part of foot Quantity: 1 : CPT4: 3229179 Debridement; bone (includes epidermis, dermis, subQ tissue, muscle and/or fascia, if performed) 1st 20 sqcm or less ICD-10 Diagnosis Description L97.514 Non-pressure chronic ulcer of other part of right foot with necrosis of bone E11.621  Type 2 diabetes mellitus with foot ulcer Quantity: 1 Electronic Signature(s) Signed: 03/15/2023 5:26:35 PM By: [name] Raisin DO Entered By: [name] Raisin on 03/15/2023 16:56:31

## 2023-03-16 NOTE — Progress Notes (Signed)
 KUSHNIR, Jabarie ([id number]) [id number] Page 1 of 7 Visit Report for 03/14/2023 Arrival Information Details Patient Name: Date of Service: [name] 03/14/2023 2:15 PM Medical Record Number: [id number] Patient Account Number: [id number] Date of Birth/Sex: Treating RN: 1933-04-01 (89 y.o. M) Zochol, Jamie Primary Care Serena Petterson: Jennelle Riis Other Clinician: Referring Benino Korinek: Treating Sunshine Mackowski/Extender: Rosan Harlene Jennelle Riis Devra in Treatment: 56 Visit Information History Since Last Visit Added or deleted any medications: No Patient Arrived: Ambulatory Any new allergies or adverse reactions: No Arrival Time: 14:36 Had a fall or experienced change in No Accompanied By: daughter activities of daily living that may affect Transfer Assistance: None risk of falls: Patient Identification Verified: Yes Signs or symptoms of abuse/neglect since last visito No Secondary Verification Process Completed: Yes Hospitalized since last visit: No Patient Requires Transmission-Based Precautions: No Implantable device outside of the clinic excluding No Patient Has Alerts: Yes cellular tissue based products placed in the center Patient Alerts: Patient on Blood Thinner since last visit: Has Dressing in Place as Prescribed: Yes Pain Present Now: Yes Electronic Signature(s) Signed: 03/14/2023 4:32:03 PM By: Zochol, Jamie RN Entered By: Zochol, Jamie on 03/14/2023 14:36:43 -------------------------------------------------------------------------------- Encounter Discharge Information Details Patient Name: Date of Service: [name] 03/14/2023 2:15 PM Medical Record Number: [id number] Patient Account Number: [id number] Date of Birth/Sex: Treating RN: 08/16/1933 (89 y.o. M) Zochol, Jamie Primary Care Eriyonna Matsushita: Jennelle Riis Other Clinician: Referring Kalicia Dufresne: Treating Kara Mierzejewski/Extender: Rosan Harlene Jennelle Riis Devra in  Treatment: 52 Encounter Discharge Information Items Post Procedure Vitals Discharge Condition: Stable Temperature (F): 97.8 Ambulatory Status: Ambulatory Pulse (bpm): 57 Discharge Destination: Home Respiratory Rate (breaths/min): 16 Transportation: Private Auto Blood Pressure (mmHg): 143/75 Accompanied By: daughter Schedule Follow-up Appointment: Yes Clinical Summary of Care: Electronic Signature(s) Signed: 03/14/2023 4:24:22 PM By: Zochol, Jamie RN Entered By: Zochol, Jamie on 03/14/2023 16:24:22 Cummings, Jaylend ([id number]) [id number] Page 2 of 7 -------------------------------------------------------------------------------- Lower Extremity Assessment Details Patient Name: Date of Service: [name] 03/14/2023 2:15 PM Medical Record Number: [id number] Patient Account Number: [id number] Date of Birth/Sex: Treating RN: May 19, 1933 (89 y.o. M) Zochol, Jamie Primary Care Ryian Lynde: Jennelle Riis Other Clinician: Referring Hallie Ishida: Treating Davier Tramell/Extender: Rosan Harlene Jennelle Riis Devra in Treatment: 36 Edema Assessment Assessed: [Left: No] [Right: No] Edema: [Left: N] [Right: o] Calf Left: Right: Point of Measurement: From Medial Instep 32 cm Ankle Left: Right: Point of Measurement: From Medial Instep 22 cm Vascular Assessment Extremity colors, hair growth, and conditions: Extremity Color: [Right:Normal] Hair Growth on Extremity: [Right:No] Temperature of Extremity: [Right:Warm] Capillary Refill: [Right:< 3 seconds] Dependent Rubor: [Right:No No] Electronic Signature(s) Signed: 03/14/2023 4:32:03 PM By: Zochol, Jamie RN Entered By: Zochol, Jamie on 03/14/2023 14:39:47 -------------------------------------------------------------------------------- Multi Wound Chart Details Patient Name: Date of Service: [name] 03/14/2023 2:15 PM Medical Record Number: [id number] Patient Account Number: [id number] Date of  Birth/Sex: Treating RN: May 17, 1933 (89 y.o. M) Primary Care Kaylene Dawn: Jennelle Riis Other Clinician: Referring Shyah Cadmus: Treating Goldia Ligman/Extender: Rosan Harlene Jennelle Riis Devra in Treatment: 36 Vital Signs Height(in): 71 Pulse(bpm): 57 Weight(lbs): 170 Blood Pressure(mmHg): 143/75 Body Mass Index(BMI): 23.7 Temperature(F): 97.8 Respiratory Rate(breaths/min): 16 [4:Photos:] [N/A:N/A] Right, Lateral Foot N/A N/A Wound Location: Shear/Friction N/A N/A Wounding Event: Diabetic Wound/Ulcer of the Lower N/A N/A Primary Etiology: Extremity Pressure Ulcer N/A N/A Secondary Etiology: Cataracts, Anemia, Asthma, Chronic N/A N/A Comorbid History: Obstructive Pulmonary Disease (COPD), Peripheral Arterial Disease, Type II Diabetes, Gout, Osteoarthritis, Neuropathy 11/21/2022 N/A N/A Date Acquired: 14 N/A N/A Weeks of Treatment: Open  N/A N/A Wound Status: No N/A N/A Wound Recurrence: 1.9x1.7x0.4 N/A N/A Measurements L x W x D (cm) 2.537 N/A N/A A (cm) : rea 1.015 N/A N/A Volume (cm) : 51.10% N/A N/A % Reduction in A rea: -95.90% N/A N/A % Reduction in Volume: Position 1 (o'clock): 6 Starting Position 1 (o'clock): 8 Ending Position 1 (o'clock): 0.6 Maximum Distance 1 (cm): 2 Starting Position 2 (o'clock): 4 Ending Position 2 (o'clock): 0.4 Maximum Distance 2 (cm): Yes N/A N/A Tunneling: Yes N/A N/A Undermining: Grade 2 N/A N/A Classification: Medium N/A N/A Exudate A mount: Serosanguineous N/A N/A Exudate Type: red, brown N/A N/A Exudate Color: Distinct, outline attached N/A N/A Wound Margin: Medium (34-66%) N/A N/A Granulation A mount: Red, Pink N/A N/A Granulation Quality: Medium (34-66%) N/A N/A Necrotic A mount: Fat Layer (Subcutaneous Tissue): Yes N/A N/A Exposed Structures: Tendon: Yes Bone: Yes Fascia: No Muscle: No Joint: No Small (1-33%) N/A N/A Epithelialization: Callus: Yes N/A N/A Periwound Skin Texture: Excoriation:  No Induration: No Crepitus: No Rash: No Scarring: No Maceration: Yes N/A N/A Periwound Skin Moisture: Dry/Scaly: No Rubor: Yes N/A N/A Periwound Skin Color: Atrophie Blanche: No Cyanosis: No Ecchymosis: No Erythema: No Hemosiderin Staining: No Mottled: No Pallor: No No Abnormality N/A N/A Temperature: Biopsy N/A N/A Procedures Performed: Treatment Notes Electronic Signature(s) Signed: 03/15/2023 5:26:35 PM By: Rosan Raisin DO Entered By: Rosan Raisin on 03/14/2023 16:20:21 Multi-Disciplinary Care Plan Details -------------------------------------------------------------------------------- ESTELLE SPELLER ([id number]) [id number] Page 4 of 7 Patient Name: Date of Service: [name] 03/14/2023 2:15 PM Medical Record Number: [id number] Patient Account Number: [id number] Date of Birth/Sex: Treating RN: 04/04/1933 (89 y.o. M) Zochol, Jamie Primary Care Bonne Whack: Jennelle Riis Other Clinician: Referring Michaelia Beilfuss: Treating Chivonne Rascon/Extender: Rosan Raisin Jennelle Riis Devra in Treatment: 66 Active Inactive Wound/Skin Impairment Nursing Diagnoses: Impaired tissue integrity Knowledge deficit related to ulceration/compromised skin integrity Goals: Patient will have a decrease in wound volume by X% from date: (specify in notes) Date Initiated: 07/04/2022 Target Resolution Date: 07/05/2023 Goal Status: Active Patient/caregiver will verbalize understanding of skin care regimen Date Initiated: 07/04/2022 Target Resolution Date: 07/05/2023 Goal Status: Active Ulcer/skin breakdown will have a volume reduction of 30% by week 4 Date Initiated: 07/04/2022 Date Inactivated: 10/27/2022 Target Resolution Date: 11/05/2022 Goal Status: Met Ulcer/skin breakdown will have a volume reduction of 50% by week 8 Date Initiated: 07/04/2022 Date Inactivated: 10/27/2022 Target Resolution Date: 11/05/2022 Goal Status: Met Interventions: Assess  patient/caregiver ability to obtain necessary supplies Assess patient/caregiver ability to perform ulcer/skin care regimen upon admission and as needed Assess ulceration(s) every visit Notes: Electronic Signature(s) Signed: 03/14/2023 4:32:03 PM By: Zochol, Jamie RN Entered By: Zochol, Jamie on 03/14/2023 15:12:28 -------------------------------------------------------------------------------- Pain Assessment Details Patient Name: Date of Service: [name] 03/14/2023 2:15 PM Medical Record Number: [id number] Patient Account Number: [id number] Date of Birth/Sex: Treating RN: 1933/10/01 (89 y.o. M) Zochol, Jamie Primary Care Sammy Douthitt: Jennelle Riis Other Clinician: Referring Sharlynn Seckinger: Treating Jemarion Roycroft/Extender: Rosan Raisin Jennelle Riis Devra in Treatment: 48 Active Problems Location of Pain Severity and Description of Pain Patient Has Paino Yes Site Locations Pain LocationFredrick Geoghegan, Yahshua ([id number]) [id number] Page 5 of 7 Pain Location: Pain in Ulcers Rate the pain. Current Pain Level: 4 Character of Pain Describe the Pain: Throbbing Pain Management and Medication Current Pain Management: Electronic Signature(s) Signed: 03/14/2023 4:32:03 PM By: Zochol, Jamie RN Entered By: Zochol, Jamie on 03/14/2023 14:37:28 -------------------------------------------------------------------------------- Patient/Caregiver Education Details Patient Name: Date of Service: [name] 1/7/2025andnbsp2:15 PM Medical Record  Number: [id number] Patient Account Number: [id number] Date of Birth/Gender: Treating RN: [birth year] (89 y.o. M) Zochol, Jamie Primary Care Physician: Jennelle Riis Other Clinician: Referring Physician: Treating Physician/Extender: Rosan Harlene Jennelle Riis Devra in Treatment: 38 Education Assessment Education Provided To: Patient Education Topics Provided Wound Debridement: Methods:  Explain/Verbal Responses: Reinforcements needed, State content correctly Electronic Signature(s) Signed: 03/14/2023 4:32:03 PM By: Zochol, Jamie RN Entered By: Zochol, Jamie on 03/14/2023 15:13:27 -------------------------------------------------------------------------------- Wound Assessment Details Patient Name: Date of Service: [name] 03/14/2023 2:15 PM Medical Record Number: [id number] Patient Account Number: [id number] MESHACH, PERRY ([id number]) [id number] Page 6 of 7 Date of Birth/Sex: Treating RN: [birth year] (89 y.o. M) Zochol, Jamie Primary Care Melanye Hiraldo: Other Clinician: Jennelle Riis Referring Damontae Loppnow: Treating Amayah Staheli/Extender: Rosan Harlene Jennelle Riis Devra in Treatment: 36 Wound Status Wound Number: 4 Primary Diabetic Wound/Ulcer of the Lower Extremity Etiology: Wound Location: Right, Lateral Foot Secondary Pressure Ulcer Wounding Event: Shear/Friction Etiology: Date Acquired: 11/21/2022 Wound Open Weeks Of Treatment: 14 Status: Clustered Wound: No Comorbid Cataracts, Anemia, Asthma, Chronic Obstructive Pulmonary History: Disease (COPD), Peripheral Arterial Disease, Type II Diabetes, Gout, Osteoarthritis, Neuropathy Photos Wound Measurements Length: (cm) 1.9 Width: (cm) 1.7 Depth: (cm) 0.4 Area: (cm) 2.537 Volume: (cm) 1.015 % Reduction in Area: 51.1% % Reduction in Volume: -95.9% Epithelialization: Small (1-33%) Tunneling: Yes Undermining: Yes Location 1 Starting Position (o'clock): 6 Ending Position (o'clock): 8 Maximum Distance: (cm) 0.6 Location 2 Starting Position (o'clock): 2 Ending Position (o'clock): 4 Maximum Distance: (cm) 0.4 Wound Description Classification: Grade 2 Wound Margin: Distinct, outline attached Exudate Amount: Medium Exudate Type: Serosanguineous Exudate Color: red, brown Foul Odor After Cleansing: No Slough/Fibrino Yes Wound Bed Granulation Amount: Medium (34-66%)  Exposed Structure Granulation Quality: Red, Pink Fascia Exposed: No Necrotic Amount: Medium (34-66%) Fat Layer (Subcutaneous Tissue) Exposed: Yes Necrotic Quality: Adherent Slough Tendon Exposed: Yes Muscle Exposed: No Joint Exposed: No Bone Exposed: Yes Periwound Skin Texture Texture Color No Abnormalities Noted: No No Abnormalities Noted: No Callus: Yes Atrophie Blanche: No Crepitus: No Cyanosis: No Excoriation: No Ecchymosis: No Induration: No Erythema: No Rash: No Hemosiderin Staining: No Scarring: No Mottled: No Pallor: No Moisture Rubor: Yes No Abnormalities Noted: No Dry / Scaly: No Temperature / Pain Everett, Taysean ([id number]) [id number] Page 7 of 7 Maceration: Yes Temperature: No Abnormality Treatment Notes Wound #4 (Foot) Wound Laterality: Right, Lateral Cleanser Soap and Water Discharge Instruction: May shower and wash wound with dial antibacterial soap and water prior to dressing change. Vashe 5.8 (oz) Discharge Instruction: wet to dry with VASHE Wound Cleanser Discharge Instruction: Cleanse the wound with wound cleanser prior to applying a clean dressing using gauze sponges, not tissue or cotton balls. Peri-Wound Care Skin Prep Discharge Instruction: Use skin prep as directed Topical Primary Dressing wet to dry Vashe Secondary Dressing ABD Pad, 5x9 Discharge Instruction: Apply over primary dressing as directed. Secured With [employer], 4.5x3.1 (in/yd) Discharge Instruction: Secure with Kerlix as directed. 79M Medipore H Soft Cloth Surgical T ape, 4 x 10 (in/yd) Discharge Instruction: Secure with tape as directed. Compression Wrap Compression Stockings Add-Ons Electronic Signature(s) Signed: 03/14/2023 4:32:03 PM By: Zochol, Jamie RN Entered By: Zochol, Jamie on 03/14/2023 14:50:18 -------------------------------------------------------------------------------- Vitals Details Patient Name: Date of  Service: [name] 03/14/2023 2:15 PM Medical Record Number: [id number] Patient Account Number: [id number] Date of Birth/Sex: Treating RN: 12-03-1933 (89 y.o. M) Zochol, Jamie Primary Care Simi Briel: Jennelle Riis Other Clinician: Referring Eniola Cerullo: Treating Zyshawn Bohnenkamp/Extender: Rosan Harlene Jennelle Riis Devra in Treatment: 35 Vital Signs  Time Taken: 14:30 Temperature (F): 97.8 Height (in): 71 Pulse (bpm): 57 Weight (lbs): 170 Respiratory Rate (breaths/min): 16 Body Mass Index (BMI): 23.7 Blood Pressure (mmHg): 143/75 Reference Range: 80 - 120 mg / dl Electronic Signature(s) Signed: 03/14/2023 4:32:03 PM By: Zochol, Jamie RN Entered By: Zochol, Jamie on 03/14/2023 14:37:12

## 2023-03-19 LAB — AEROBIC/ANAEROBIC CULTURE W GRAM STAIN (SURGICAL/DEEP WOUND)

## 2023-03-21 ENCOUNTER — Encounter (HOSPITAL_BASED_OUTPATIENT_CLINIC_OR_DEPARTMENT_OTHER): Payer: PPO | Admitting: Internal Medicine

## 2023-03-21 DIAGNOSIS — E11621 Type 2 diabetes mellitus with foot ulcer: Secondary | ICD-10-CM | POA: Diagnosis not present

## 2023-03-21 DIAGNOSIS — I70235 Atherosclerosis of native arteries of right leg with ulceration of other part of foot: Secondary | ICD-10-CM

## 2023-03-21 DIAGNOSIS — L97514 Non-pressure chronic ulcer of other part of right foot with necrosis of bone: Secondary | ICD-10-CM

## 2023-03-22 ENCOUNTER — Encounter (HOSPITAL_BASED_OUTPATIENT_CLINIC_OR_DEPARTMENT_OTHER): Payer: PPO | Admitting: Internal Medicine

## 2023-03-22 NOTE — Progress Notes (Signed)
 [name], [name] ([id number]) [id number] Page 1 of 10 Visit Report for 03/21/2023 Chief Complaint Document Details Patient Name: Date of Service: [name] [name] [name] 03/21/2023 2:30 PM Medical Record Number: [id number] Patient Account Number: [id number] Date of Birth/Sex: Treating RN: [birth year] (89 y.o. M) Primary Care Provider: Jennelle Riis Other Clinician: Referring Provider: Treating Provider/Extender: Rosan Harlene Jennelle Riis Devra in Treatment: 37 Information Obtained from: Patient Chief Complaint 07/04/2022; right foot wound Electronic Signature(s) Signed: 03/21/2023 3:39:41 PM By: Rosan Harlene DO Entered By: Rosan Harlene on 03/21/2023 15:23:26 -------------------------------------------------------------------------------- HPI Details Patient Name: Date of Service: [name] [name] [name] 03/21/2023 2:30 PM Medical Record Number: [id number] Patient Account Number: [id number] Date of Birth/Sex: Treating RN: 1933-03-15 (89 y.o. M) Primary Care Provider: Jennelle Riis Other Clinician: Referring Provider: Treating Provider/Extender: Rosan Harlene Jennelle Riis Devra in Treatment: 37 History of Present Illness HPI Description: 07/04/2022 [name] is an 89 year old male with a past medical history of uncontrolled, insulin  dependent type 2 diabetes, CKD stage III, and COPD that presents to the clinic for a right foot wound. He has an extensive history of peripheral arterial disease to the right leg. He has had a right superficial femoral to posterior tibial artery bypass by Dr. Jerome in 2020. He had a right common femoral to plantar posterior tibial artery bypass on 02/03/2022 by Dr. Magda and at that time he also had a right toe amputation. He states this wound site had healed completely. Prior to this he had amputations to the first and second digit to the right foot. He presents today with an open wound to the  previous right toe amputation site for the past 2 months. He has been following with vein and vascular for this issue. Most recent ABIs were on 06/22/2022 that showed absent waveforms in the right lower extremity. Currently his right lower extremity bypass is occluded and no plan for another anteriorgram and has he likely has no revascularization options. Currently he has been using Betadine to the wound bed. He denies signs of infection. 07/11/2022 patient arrives in clinic today using Hydrofera Blue on the toe amputation site. The patient is revascularized and is not felt to have any other options for revascularization. We are using Hydrofera Blue and gauze. 5/13; patient presents for follow-up. He has been using Hydrofera Blue to the toe amputation site. He has had no issues. Wound is smaller. He has been approved for [facility] and was agreeable to have this placed in office today. 5/20; patient presents for follow-up. We have been using Kerecis to the wound bed. There is been improvement in healing. Unfortunately patient did not change the outer dressing for a week. There is slight maceration to the periwound. Unclear why he did not do this. 5/30; patient presents for follow-up. We have been using Kerecis to the wound bed. More maceration to the periwound today. No signs of infection. 6/10; patient presents for follow-up. He has been using Hydrofera Blue to the wound bed. He has no issues or complaints today. Maceration has almost completely resolved. 6/18; patient presents for follow-up. He has been using blast X and collagen to the wound bed without issues. Wound appears smaller today. 6/28; patient presents for follow-up. He has been using blast X and collagen to the wound bed. The wound is smaller. 7/11; patient presents for follow-up. He has been using blast X and collagen to the wound bed. Wound is smaller. He has slough accumulation. 7/25; patient presents for follow-up. He has been using blast  X and collagen to the wound bed. Wound is stable. He denies signs of infection. [name], [name] ([id number]) [id number] Page 2 of 10 8/26; patient presents for follow-up. He has been using blast X and collagen to the wound bed. Wound is slightly smaller. He has no issues or complaints. 8/29; patient presents for follow-up. He has been using blast X and Aquacel Ag to the wound bed. He has been using a Velcro wrap at his toes to keep the toes closer together to help with wound healing. He has no issues or complaints today. Wound is smaller. 9/12; patient presents for follow-up. He has been using blast X and Aquacel Ag to the wound bed. Unfortunately he has developed a pressure injury to the lateral aspect of the foot. He is not sure how this started. No open wound there. He denies pain. 9/23; this patient has a wound on the right foot at the amputation site of her third toe. It is right up against the fourth toe making it somewhat difficult to dress. When he was here last time he had a blister/DTI on the lateral aspect of the fifth MTP 9/30; patient has a wound on the right foot amputation site of his third toe. Probably in the third fourth webspace. I changed him to endoform last week. The wound looks somewhat better The area over the lateral part of the fifth metatarsal head which was blistered with some central deep tissue looking injury was leaking fluid today. The blister was unroofed. I used pickups and scissors to remove this. I do not know that there is anything technically open here although there is certainly some central tissue at risk 10/7; his original wound at the amputation site in his third fourth webspace looks stable to improved. We have been using endoform. The real concern today is the area over the lateral part of the fifth metatarsal head. This initially looked like a blister with central deep tissue injury I unroofed the blister last week there was  nothing technically open but the deep tissue injury was concerning. T oday he comes in with a necrotic surface I remove this. The central part of this area which I am assuming was the deep tissue injury is necrotic I removed some of the necrotic tissue with a #15 scalpel and pickups. There is undermining around the wound edge here. 10/14; his original wound at the amputation site of his third fourth toe webspace looks improved we have gradually been making progress here. Using endoform. Unfortunately the area over the lateral part of the right fifth metatarsal head continues to deteriorate. Necrotic surface last week continues this week. The PCR culture I did last week did not manage to make it to the right lab. I have looked at his x-ray the bone underneath does not look that bad to me however we have not yet had a radiologist interpretation. We have been using Iodoflex this week. 10/21; the original amputation site of his third fourth toe webspace continues to look reasonably healthy and making slow but steady progress we have been using endoform The area on the right lateral fifth metatarsal head has been deteriorating problem. PCR culture grew MRSA. I have him on doxycycline . There is exposed bone in the wound under necrotic material. I still do not have the x-ray back from October 7 at least in terms of the report. Addendum 12/27/2022; I have looked back on this patient's vascular status. He is status post a right common femoral to posterior tibial artery  bypass in 2020. He had a right common femoral to plantar posterior tibial bypass by Dr. Magda in November 2023. He also had a right third toe amputation at the same time. The wound that we have been dealing with the third toe amputation site was present after the surgery. He was last seen by vascular surgery on 06/21/2022 and was supposed to follow-up with Dr. Magda in 3 weeks although I do not see that appointment. I have included the major  findings in the last right lower extremity angiogram on 01/21/2022 10/28;SABRA The x-ray report that I ordered several weeks ago is back. Indeed there was nothing under the fifth met head on the right however notable for bone necrosis under the wound on the left third toe. This suggest osteomyelitis. I am going to extend his doxycycline  for 3 further weeks. PCR culture on the lateral foot grew MRSA I sent a secure text message to Dr. Vonzell. His reply was that there was no additional options for revascularization. The patient went to see Dr. Vonzell and that is the message they came up with. He did offer him an amputation [o Above-knee]. We have been using endoform to the third toe amputation site. Silver alginate to the new wound over the fifth metatarsal head 11/4; patient continues on doxycycline  which she is handling well. He started hyperbaric oxygen  today and tolerated the first treatment well. We have been using endoform to the third toe amputation site which was the original wound he came to the clinic for. We are using Santyl and silver alginate to the wound over the fifth metatarsal head which was the wound that opened while he was under observation. Culture grew MRSA and the wound over the fifth metatarsal head 11/11 continuing doxycycline . He is tolerating hyperbarics although I am continually notified about tachycardia in the 100-110 range. Looking back on EKGs shows this is a longstanding problem sinus tachycardia. He also has left anterior fascicular block. He does not describe chest pain The original wound on the third toe amputation site looks a lot better this is contracting however the area on the right lateral fifth metatarsal head only has a rim of height of granulation tissue around what looks to be periosteum 11/18; continuing doxycycline . He is tolerating hyperbarics. The original wound on the third toe amputation site is very small and healthy however the wound the developed on  the right lateral fifth met head which probably was a combination of infection and ischemic has a nonviable surface. We have been using Hydrofera Blue and Santyl he continues on doxycycline . The doxycycline  was same that the x-ray from 10/31 showing high-grade phalangeal bone erosion suspicious for osteomyelitis. At that time there was nothing demonstrated on the fifth met head. I have repeated the x-ray of the foot looking specifically at the fifth met head however the report is not available. 12/3; the patient's original wound on the amputation site of his third toe is closed overall though it is a slight surface eschar I did not remove this. The problematic area on the right lateral fifth metatarsal head remains the major issue here. He is not a candidate for further revascularization. He has what looks to be some viable granulation over the bone which is an improvement. However extending into the superior part of his foot a large open area over the bone itself. We used Apligraf #1 today. He continues with HBO 12/10; the original wound on the amputation site is eschared over. There is probably a small  open area remaining here but I did not debride this. The original osteomyelitis was in this area. The subsequent wound is over the right lateral fifth metatarsal head. We applied Apligraf #1 last week. The wound has improved. The option for this patient was amputation at some level. He thinks he was told that this would be above-knee certainly complicating to decide in that direction. Fortunately his wound looks somewhat better today. No obvious infection 12/17; his original wound on the amputation site of his third toe is still eschared over but I have not disturb this. The original osteomyelitis that we are treating him for was in this area. The subsequent wound is over the right lateral fifth met head. This was a combination of ischemic and infected wound that developed rapidly from 1 week to the  next in this clinic. [name], [name] ([id number]) [id number] Page 3 of 10 12/23; his original wound on the amputation site of his third toe is healed. This is the area with underlying osteomyelitis that he completed hyperbaric oxygen  for on Friday. During the course of his stay however he developed a rapidly progressive wound on the right lateral fifth metatarsal head which was a combination of ischemia probably with coexistent infection. I gave him 6 weeks of doxycycline . We extended HBO. And I have been applying Apligraf to this area [#4 to this area today]. We have managed to get the wound to contract however there is still precious little if any tissue over this bone which in itself does not feel completely viable. He is not a candidate for a ray amputation secondary to known PAD. I verified this with vein and vascular Dr. Vonzell. 03/07/2023: Apparently the wound looks better today. There is tissue coverage of the exposed bone. The depth of the wound has come in overall, but there is an increase in the undermining towards the dorsal part of his foot. There is nonviable tendon exposed. 03/14/2023; Patient presents for follow-up. The lateral right foot wound has significantly declined in appearance. Bone is exposed and nonviable. There is mild odor and patient reports increased pain. Apligraf was placed in standard fashion at last clinic visit. 03/21/2023; patient presents for follow-up. At last clinic visit patient's wound had bone exposed and a biopsy showed acute osteomyelitis and culture grew Staph aureus sensitive to doxycycline . Patient reports improvement in his chronic pain as well as wound healing over the past week. He has been re-approved for hyperbaric oxygen  therapy and he is starting this again tomorrow. Electronic Signature(s) Signed: 03/21/2023 3:39:41 PM By: Rosan Raisin DO Entered By: Rosan Raisin on 03/21/2023  15:28:26 -------------------------------------------------------------------------------- Physical Exam Details Patient Name: Date of Service: [name] [name] [name] 03/21/2023 2:30 PM Medical Record Number: [id number] Patient Account Number: [id number] Date of Birth/Sex: Treating RN: 14-Dec-1933 (89 y.o. M) Primary Care Provider: Jennelle Riis Other Clinician: Referring Provider: Treating Provider/Extender: Rosan Raisin Jennelle Riis Devra in Treatment: 37 Constitutional respirations regular, non-labored and within target range for patient.. Cardiovascular 2+ dorsalis pedis/posterior tibialis pulses. Psychiatric pleasant and cooperative. Notes T the lateral right foot there is an open wound with granulation tissue and minimal exposed bone. Granulation tissue has a pale pink appearance. No signs of o surrounding soft tissue infection. No purulent drainage. Minimal pain on palpation. Electronic Signature(s) Signed: 03/21/2023 3:39:41 PM By: Rosan Raisin DO Entered By: Rosan Raisin on 03/21/2023 15:29:12 -------------------------------------------------------------------------------- Physician Orders Details Patient Name: Date of Service: [name] [name] [name] 03/21/2023 2:30 PM Medical Record Number: [id number] Patient Account Number: [id number]  Date of Birth/Sex: Treating RN: 11/01/1933 (89 y.o. [name] Primary Care Provider: Jennelle Riis Other Clinician: Referring Provider: Treating Provider/Extender: Rosan Harlene Jennelle Riis Devra in Treatment: [id number] Verbal / Phone Orders: No Diagnosis Coding Follow-up Appointments ppointment in 1 week. - Dr. Rosan Return A Large, Yuta ([id number]) [id number] Page 4 of 10 Other: - [employer] Continue taking the doxycycline -Dr. Rosan extending antibiotic so you take antibiotic 3 more weeks for a total of 4 weeks of antibiotics Anesthetic (In clinic) Topical  Lidocaine  5% applied to wound bed Cellular or Tissue Based Products Wound #4 Right,Lateral Foot Cellular or Tissue Based Product Type: - apligraf-applied #1 02/08/2023 apligraf-applied #2 02/14/23 Apligraf- applied #3 02/21/2023 Apligraf- applied #4 02/27/2023 Apligraf #5 applied 03/07/23 Bathing/ Shower/ Hygiene May shower with protection but do not get wound dressing(s) wet. Protect dressing(s) with water repellant cover (for example, large plastic bag) or a cast cover and may then take shower. Off-Loading Open toe surgical shoe to: - right foot. wear while walking and standing. minimize pressure and walking/ standing. Hyperbaric Oxygen  Therapy Wound #4 Right,Lateral Foot Evaluate for HBO Therapy - Wag grade 3 2.0 ATA for 90 Minutes with 2 Five (5) Minute A Breaks ir Total Number of Treatments: - 40 treatments One treatments per day (delivered Monday through Friday unless otherwise specified in Special Instructions below): Finger stick Blood Glucose Pre- and Post- HBOT Treatment. Follow Hyperbaric Oxygen  Glycemia Protocol Give two 4oz orange juices in addition to Glucerna when the glycemic protocol is used. Wound Treatment Wound #4 - Foot Wound Laterality: Right, Lateral Cleanser: Soap and Water 1 x Per Day/30 Days Discharge Instructions: May shower and wash wound with dial antibacterial soap and water prior to dressing change. Cleanser: Vashe 5.8 (oz) 1 x Per Day/30 Days Discharge Instructions: wet to dry with VASHE Cleanser: Wound Cleanser 1 x Per Day/30 Days Discharge Instructions: Cleanse the wound with wound cleanser prior to applying a clean dressing using gauze sponges, not tissue or cotton balls. Peri-Wound Care: Skin Prep 1 x Per Day/30 Days Discharge Instructions: Use skin prep as directed Prim Dressing: wet to dry Vashe ary 1 x Per Day/30 Days Secondary Dressing: ABD Pad, 5x9 1 x Per Day/30 Days Discharge Instructions: Apply over primary dressing as directed. Secured  With: [employer], 4.5x3.1 (in/yd) 1 x Per Day/30 Days Discharge Instructions: Secure with Kerlix as directed. Secured With: 53M Medipore H Soft Cloth Surgical T ape, 4 x 10 (in/yd) 1 x Per Day/30 Days Discharge Instructions: Secure with tape as directed. Patient Medications llergies: No Known Allergies A Notifications Medication Indication Start End 03/21/2023 doxycycline  hyclate DOSE 1 - oral 100 mg tablet - 1 tablet oral twice a day x 14 days GLYCEMIA INTERVENTIONS PROTOCOL PRE-HBO GLYCEMIA INTERVENTIONS ACTION INTERVENTION Obtain pre-HBO capillary blood glucose (ensure 1 physician order is in chart). A. Notify HBO physician and await physician orders. 2 If result is 70 mg/dl or below: B. If the result meets the hospital definition of a critical result, follow hospital policy. A. Give patient an 8 ounce Glucerna Shake, an 8 ounce Ensure, or 8 ounces of a Glucerna/Ensure equivalent dietary [name], [name] ([id number]) [phone].pdf Page 5 of 10 supplement*. B. Wait 30 minutes. If result is 71 mg/dl to 869 mg/dl: C. Retest patients capillary blood glucose (CBG). D. If result greater than or equal to 110 mg/dl, proceed with HBO. If result less than 110 mg/dl, notify HBO physician and consider holding HBO. If result is 131 mg/dl to 750 mg/dl: A.  Proceed with HBO. A. Notify HBO physician and await physician orders. B. It is recommended to hold HBO and do If result is 250 mg/dl or greater: blood/urine ketone testing. C. If the result meets the hospital definition of a critical result, follow hospital policy. POST-HBO GLYCEMIA INTERVENTIONS ACTION INTERVENTION Obtain post HBO capillary blood glucose (ensure 1 physician order is in chart). A. Notify HBO physician and await physician orders. 2 If result is 70 mg/dl or below: B. If the result meets the hospital definition of a critical result, follow hospital policy. A. Give patient an 8  ounce Glucerna Shake, an 8 ounce Ensure, or 8 ounces of a Glucerna/Ensure equivalent dietary supplement*. B. Wait 15 minutes for symptoms of If result is 71 mg/dl to 899 mg/dl: hypoglycemia (i.e. nervousness, anxiety, sweating, chills, clamminess, irritability, confusion, tachycardia or dizziness). C. If patient asymptomatic, discharge patient. If patient symptomatic, repeat capillary blood glucose (CBG) and notify HBO physician. If result is 101 mg/dl to 750 mg/dl: A. Discharge patient. A. Notify HBO physician and await physician orders. B. It is recommended to do blood/urine ketone If result is 250 mg/dl or greater: testing. C. If the result meets the hospital definition of a critical result, follow hospital policy. *Juice or candies are NOT equivalent products. If patient refuses the Glucerna or Ensure, please consult the hospital dietitian for an appropriate substitute. Electronic Signature(s) Signed: 03/21/2023 3:39:41 PM By: Rosan Raisin DO Previous Signature: 03/21/2023 3:34:31 PM Version By: Rosan Raisin DO Entered By: Rosan Raisin on 03/21/2023 15:34:39 -------------------------------------------------------------------------------- Problem List Details Patient Name: Date of Service: [name] [name] [name] 03/21/2023 2:30 PM Medical Record Number: [id number] Patient Account Number: [id number] Date of Birth/Sex: Treating RN: [birth year] (89 y.o. M) Primary Care Provider: Jennelle Riis Other Clinician: Referring Provider: Treating Provider/Extender: Rosan Raisin Jennelle Riis Devra in Treatment: 37 Active Problems ICD-10 Encounter Code Description Active Date MDM Diagnosis L97.514 Non-pressure chronic ulcer of other part of right foot with necrosis of bone 12/26/2022 No Yes E11.621 Type 2 diabetes mellitus with foot ulcer 07/04/2022 No Yes I70.235 Atherosclerosis of native arteries of right leg with ulceration of other part of 07/04/2022 No  Yes foot [name], [name] ([id number]) [phone].pdf Page 6 of 10 Inactive Problems ICD-10 Code Description Active Date Inactive Date L97.512 Non-pressure chronic ulcer of other part of right foot with fat layer exposed 07/04/2022 07/04/2022 Resolved Problems Electronic Signature(s) Signed: 03/21/2023 3:39:41 PM By: Rosan Raisin DO Entered By: Rosan Raisin on 03/21/2023 15:22:30 -------------------------------------------------------------------------------- Progress Note Details Patient Name: Date of Service: [name] [name] [name] 03/21/2023 2:30 PM Medical Record Number: [id number] Patient Account Number: [id number] Date of Birth/Sex: Treating RN: Aug 16, 1933 (89 y.o. M) Primary Care Provider: Jennelle Riis Other Clinician: Referring Provider: Treating Provider/Extender: Rosan Raisin Jennelle Riis Devra in Treatment: 37 Subjective Chief Complaint Information obtained from Patient 07/04/2022; right foot wound History of Present Illness (HPI) 07/04/2022 Mr. Dhillon Comunale is an 89 year old male with a past medical history of uncontrolled, insulin  dependent type 2 diabetes, CKD stage III, and COPD that presents to the clinic for a right foot wound. He has an extensive history of peripheral arterial disease to the right leg. He has had a right superficial femoral to posterior tibial artery bypass by Dr. Jerome in 2020. He had a right common femoral to plantar posterior tibial artery bypass on 02/03/2022 by Dr. Magda and at that time he also had a right toe amputation. He states this wound site had healed completely. Prior to this he had amputations to  the first and second digit to the right foot. He presents today with an open wound to the previous right toe amputation site for the past 2 months. He has been following with vein and vascular for this issue. Most recent ABIs were on 06/22/2022 that showed absent waveforms in the right lower  extremity. Currently his right lower extremity bypass is occluded and no plan for another anteriorgram and has he likely has no revascularization options. Currently he has been using Betadine to the wound bed. He denies signs of infection. 07/11/2022 patient arrives in clinic today using Hydrofera Blue on the toe amputation site. The patient is revascularized and is not felt to have any other options for revascularization. We are using Hydrofera Blue and gauze. 5/13; patient presents for follow-up. He has been using Hydrofera Blue to the toe amputation site. He has had no issues. Wound is smaller. He has been approved for [facility] and was agreeable to have this placed in office today. 5/20; patient presents for follow-up. We have been using Kerecis to the wound bed. There is been improvement in healing. Unfortunately patient did not change the outer dressing for a week. There is slight maceration to the periwound. Unclear why he did not do this. 5/30; patient presents for follow-up. We have been using Kerecis to the wound bed. More maceration to the periwound today. No signs of infection. 6/10; patient presents for follow-up. He has been using Hydrofera Blue to the wound bed. He has no issues or complaints today. Maceration has almost completely resolved. 6/18; patient presents for follow-up. He has been using blast X and collagen to the wound bed without issues. Wound appears smaller today. 6/28; patient presents for follow-up. He has been using blast X and collagen to the wound bed. The wound is smaller. 7/11; patient presents for follow-up. He has been using blast X and collagen to the wound bed. Wound is smaller. He has slough accumulation. 7/25; patient presents for follow-up. He has been using blast X and collagen to the wound bed. Wound is stable. He denies signs of infection. 8/26; patient presents for follow-up. He has been using blast X and collagen to the wound bed. Wound is slightly smaller.  He has no issues or complaints. 8/29; patient presents for follow-up. He has been using blast X and Aquacel Ag to the wound bed. He has been using a Velcro wrap at his toes to keep the toes closer together to help with wound healing. He has no issues or complaints today. Wound is smaller. 9/12; patient presents for follow-up. He has been using blast X and Aquacel Ag to the wound bed. Unfortunately he has developed a pressure injury to the lateral aspect of the foot. He is not sure how this started. No open wound there. He denies pain. [name], [name] ([id number]) [id number] Page 7 of 10 9/23; this patient has a wound on the right foot at the amputation site of her third toe. It is right up against the fourth toe making it somewhat difficult to dress. When he was here last time he had a blister/DTI on the lateral aspect of the fifth MTP 9/30; patient has a wound on the right foot amputation site of his third toe. Probably in the third fourth webspace. I changed him to endoform last week. The wound looks somewhat better The area over the lateral part of the fifth metatarsal head which was blistered with some central deep tissue looking injury was leaking fluid today. The blister was unroofed.  I used pickups and scissors to remove this. I do not know that there is anything technically open here although there is certainly some central tissue at risk 10/7; his original wound at the amputation site in his third fourth webspace looks stable to improved. We have been using endoform. The real concern today is the area over the lateral part of the fifth metatarsal head. This initially looked like a blister with central deep tissue injury I unroofed the blister last week there was nothing technically open but the deep tissue injury was concerning. T oday he comes in with a necrotic surface I remove this. The central part of this area which I am assuming was the deep tissue  injury is necrotic I removed some of the necrotic tissue with a #15 scalpel and pickups. There is undermining around the wound edge here. 10/14; his original wound at the amputation site of his third fourth toe webspace looks improved we have gradually been making progress here. Using endoform. Unfortunately the area over the lateral part of the right fifth metatarsal head continues to deteriorate. Necrotic surface last week continues this week. The PCR culture I did last week did not manage to make it to the right lab. I have looked at his x-ray the bone underneath does not look that bad to me however we have not yet had a radiologist interpretation. We have been using Iodoflex this week. 10/21; the original amputation site of his third fourth toe webspace continues to look reasonably healthy and making slow but steady progress we have been using endoform The area on the right lateral fifth metatarsal head has been deteriorating problem. PCR culture grew MRSA. I have him on doxycycline . There is exposed bone in the wound under necrotic material. I still do not have the x-ray back from October 7 at least in terms of the report. Addendum 12/27/2022; I have looked back on this patient's vascular status. He is status post a right common femoral to posterior tibial artery bypass in 2020. He had a right common femoral to plantar posterior tibial bypass by Dr. Magda in November 2023. He also had a right third toe amputation at the same time. The wound that we have been dealing with the third toe amputation site was present after the surgery. He was last seen by vascular surgery on 06/21/2022 and was supposed to follow-up with Dr. Magda in 3 weeks although I do not see that appointment. I have included the major findings in the last right lower extremity angiogram on 01/21/2022 10/28;SABRA The x-ray report that I ordered several weeks ago is back. Indeed there was nothing under the fifth met head on the right  however notable for bone necrosis under the wound on the left third toe. This suggest osteomyelitis. I am going to extend his doxycycline  for 3 further weeks. PCR culture on the lateral foot grew MRSA I sent a secure text message to Dr. Vonzell. His reply was that there was no additional options for revascularization. The patient went to see Dr. Vonzell and that is the message they came up with. He did offer him an amputation [o Above-knee]. We have been using endoform to the third toe amputation site. Silver alginate to the new wound over the fifth metatarsal head 11/4; patient continues on doxycycline  which she is handling well. He started hyperbaric oxygen  today and tolerated the first treatment well. We have been using endoform to the third toe amputation site which was the original wound he came  to the clinic for. We are using Santyl and silver alginate to the wound over the fifth metatarsal head which was the wound that opened while he was under observation. Culture grew MRSA and the wound over the fifth metatarsal head 11/11 continuing doxycycline . He is tolerating hyperbarics although I am continually notified about tachycardia in the 100-110 range. Looking back on EKGs shows this is a longstanding problem sinus tachycardia. He also has left anterior fascicular block. He does not describe chest pain The original wound on the third toe amputation site looks a lot better this is contracting however the area on the right lateral fifth metatarsal head only has a rim of height of granulation tissue around what looks to be periosteum 11/18; continuing doxycycline . He is tolerating hyperbarics. The original wound on the third toe amputation site is very small and healthy however the wound the developed on the right lateral fifth met head which probably was a combination of infection and ischemic has a nonviable surface. We have been using Hydrofera Blue and Santyl he continues on doxycycline . The  doxycycline  was same that the x-ray from 10/31 showing high-grade phalangeal bone erosion suspicious for osteomyelitis. At that time there was nothing demonstrated on the fifth met head. I have repeated the x-ray of the foot looking specifically at the fifth met head however the report is not available. 12/3; the patient's original wound on the amputation site of his third toe is closed overall though it is a slight surface eschar I did not remove this. The problematic area on the right lateral fifth metatarsal head remains the major issue here. He is not a candidate for further revascularization. He has what looks to be some viable granulation over the bone which is an improvement. However extending into the superior part of his foot a large open area over the bone itself. We used Apligraf #1 today. He continues with HBO 12/10; the original wound on the amputation site is eschared over. There is probably a small open area remaining here but I did not debride this. The original osteomyelitis was in this area. The subsequent wound is over the right lateral fifth metatarsal head. We applied Apligraf #1 last week. The wound has improved. The option for this patient was amputation at some level. He thinks he was told that this would be above-knee certainly complicating to decide in that direction. Fortunately his wound looks somewhat better today. No obvious infection 12/17; his original wound on the amputation site of his third toe is still eschared over but I have not disturb this. The original osteomyelitis that we are treating him for was in this area. The subsequent wound is over the right lateral fifth met head. This was a combination of ischemic and infected wound that developed rapidly from 1 week to the next in this clinic. 12/23; his original wound on the amputation site of his third toe is healed. This is the area with underlying osteomyelitis that he completed hyperbaric oxygen  for on Friday.  During the course of his stay however he developed a rapidly progressive wound on the right lateral fifth metatarsal head which was a combination of ischemia probably with coexistent infection. I gave him 6 weeks of doxycycline . We extended HBO. And I have been applying Apligraf to this area [#4 to this area today]. We have managed to get the wound to contract however there is still precious little if any tissue over this bone which in itself does not feel completely viable. He is  not a candidate for a ray amputation secondary to known PAD. I verified this with vein and vascular Dr. Vonzell. 03/07/2023: Apparently the wound looks better today. There is tissue coverage of the exposed bone. The depth of the wound has come in overall, but there is an increase in the undermining towards the dorsal part of his foot. There is nonviable tendon exposed. [name], [name] ([id number]) [id number] Page 8 of 10 03/14/2023; Patient presents for follow-up. The lateral right foot wound has significantly declined in appearance. Bone is exposed and nonviable. There is mild odor and patient reports increased pain. Apligraf was placed in standard fashion at last clinic visit. 03/21/2023; patient presents for follow-up. At last clinic visit patient's wound had bone exposed and a biopsy showed acute osteomyelitis and culture grew Staph aureus sensitive to doxycycline . Patient reports improvement in his chronic pain as well as wound healing over the past week. He has been re-approved for hyperbaric oxygen  therapy and he is starting this again tomorrow. Objective Constitutional respirations regular, non-labored and within target range for patient.. Vitals Time Taken: 2:11 PM, Height: 71 in, Weight: 170 lbs, BMI: 23.7, Temperature: 97.7 F, Pulse: 44 bpm, Respiratory Rate: 17 breaths/min, Blood Pressure: 159/72 mmHg. Cardiovascular 2+ dorsalis pedis/posterior tibialis  pulses. Psychiatric pleasant and cooperative. General Notes: T the lateral right foot there is an open wound with granulation tissue and minimal exposed bone. Granulation tissue has a pale pink o appearance. No signs of surrounding soft tissue infection. No purulent drainage. Minimal pain on palpation. Integumentary (Hair, Skin) Wound #4 status is Open. Original cause of wound was Shear/Friction. The date acquired was: 11/21/2022. The wound has been in treatment 15 weeks. The wound is located on the Right,Lateral Foot. The wound measures 2.2cm length x 1.5cm width x 0.7cm depth; 2.592cm^2 area and 1.814cm^3 volume. There is bone, tendon, and Fat Layer (Subcutaneous Tissue) exposed. There is no tunneling noted, however, there is undermining starting at 12:00 and ending at 9:00 with a maximum distance of 0.5cm. There is a medium amount of serosanguineous drainage noted. The wound margin is distinct with the outline attached to the wound base. There is medium (34-66%) red, pink granulation within the wound bed. There is a medium (34-66%) amount of necrotic tissue within the wound bed including Adherent Slough. The periwound skin appearance exhibited: Callus, Maceration, Rubor. The periwound skin appearance did not exhibit: Crepitus, Excoriation, Induration, Rash, Scarring, Dry/Scaly, Atrophie Blanche, Cyanosis, Ecchymosis, Hemosiderin Staining, Mottled, Pallor, Erythema. Periwound temperature was noted as No Abnormality. Assessment Active Problems ICD-10 Non-pressure chronic ulcer of other part of right foot with necrosis of bone Type 2 diabetes mellitus with foot ulcer Atherosclerosis of native arteries of right leg with ulceration of other part of foot Patient's wound has improved since last clinic visit. There was significant bone exposed and now this is trying to be covered with granulation tissue. Wound culture grew Staph aureus sensitive to doxycycline . Bone biopsy showed acute osteomyelitis.  I recommended 4 weeks total of Oral antibiotics. He has completed 1 week. We discussed restarting hyperbaric oxygen  therapy last clinic visit T help aid in limb salvage and he has been approved to continue this o for Wagner grade 3 to the right foot. He will start HBO therapy tomorrow. For now recommended continuing Vashe wet-to-dry dressings daily and aggressive offloading. Follow-up in 1 week. Plan Follow-up Appointments: Return Appointment in 1 week. - Dr. Rosan Other: - [employer] Continue taking the doxycycline -Dr. Rosan extending antibiotic so you take antibiotic 3 more  weeks for a total of 4 weeks of antibiotics Anesthetic: (In clinic) Topical Lidocaine  5% applied to wound bed Cellular or Tissue Based Products: Wound #4 Right,Lateral Foot: Cellular or Tissue Based Product Type: - apligraf-applied #1 02/08/2023 apligraf-applied #2 02/14/23 Apligraf- applied #3 02/21/2023 Apligraf- applied #4 02/27/2023 Apligraf #5 applied 03/07/23 Bathing/ Shower/ Hygiene: May shower with protection but do not get wound dressing(s) wet. Protect dressing(s) with water repellant cover (for example, large plastic bag) or a cast cover and may then take shower. Off-Loading: Open toe surgical shoe to: - right foot. wear while walking and standing. minimize pressure and walking/ standing. Hyperbaric Oxygen  Therapy: Wound #4 Right,Lateral Foot: Evaluate for HBO Therapy - Wag grade 3 [name], [name] ([id number]) [phone].pdf Page 9 of 10 2.0 ATA for 90 Minutes with 2 Five (5) Minute Air Breaks T Number of Treatments: - 40 treatments otal One treatments per day (delivered Monday through Friday unless otherwise specified in Special Instructions below): Finger stick Blood Glucose Pre- and Post- HBOT Treatment. Follow Hyperbaric Oxygen  Glycemia Protocol Give two 4oz orange juices in addition to Glucerna when the glycemic protocol is used. The following medication(s)  was prescribed: doxycycline  hyclate oral 100 mg tablet 1 1 tablet oral twice a day x 14 days starting 03/21/2023 WOUND #4: - Foot Wound Laterality: Right, Lateral Cleanser: Soap and Water 1 x Per Day/30 Days Discharge Instructions: May shower and wash wound with dial antibacterial soap and water prior to dressing change. Cleanser: Vashe 5.8 (oz) 1 x Per Day/30 Days Discharge Instructions: wet to dry with VASHE Cleanser: Wound Cleanser 1 x Per Day/30 Days Discharge Instructions: Cleanse the wound with wound cleanser prior to applying a clean dressing using gauze sponges, not tissue or cotton balls. Peri-Wound Care: Skin Prep 1 x Per Day/30 Days Discharge Instructions: Use skin prep as directed Prim Dressing: wet to dry Vashe 1 x Per Day/30 Days ary Secondary Dressing: ABD Pad, 5x9 1 x Per Day/30 Days Discharge Instructions: Apply over primary dressing as directed. Secured With: [employer], 4.5x3.1 (in/yd) 1 x Per Day/30 Days Discharge Instructions: Secure with Kerlix as directed. Secured With: 63M Medipore H Soft Cloth Surgical T ape, 4 x 10 (in/yd) 1 x Per Day/30 Days Discharge Instructions: Secure with tape as directed. 1. Vashe wet-to-dry dressings daily 2. Aggressive offloadingsurgical shoe 3. Restart hyperbaric oxygen  therapy 4. 4 weeks of doxycycline  5. Follow-up in 1 week Electronic Signature(s) Signed: 03/21/2023 3:39:41 PM By: Rosan Raisin DO Entered By: Rosan Raisin on 03/21/2023 15:34:48 -------------------------------------------------------------------------------- SuperBill Details Patient Name: Date of Service: [name] [name] [name] 03/21/2023 Medical Record Number: [id number] Patient Account Number: [id number] Date of Birth/Sex: Treating RN: Apr 27, 1933 (89 y.o. [name] Primary Care Provider: Jennelle Riis Other Clinician: Referring Provider: Treating Provider/Extender: Rosan Raisin Jennelle Riis Devra in Treatment: 37 Diagnosis  Coding ICD-10 Codes Code Description [phone] Non-pressure chronic ulcer of other part of right foot with necrosis of bone E11.621 Type 2 diabetes mellitus with foot ulcer I70.235 Atherosclerosis of native arteries of right leg with ulceration of other part of foot Facility Procedures : CPT4 Code: [id number] Description: 99213 - WOUND CARE VISIT-LEV 3 EST PT Modifier: Quantity: 1 Physician Procedures Electronic Signature(s) Signed: 03/21/2023 3:39:41 PM By: Rosan Raisin DO Entered By: Rosan Raisin on 03/21/2023 15:34:55

## 2023-03-23 ENCOUNTER — Encounter (HOSPITAL_BASED_OUTPATIENT_CLINIC_OR_DEPARTMENT_OTHER): Payer: PPO | Admitting: Internal Medicine

## 2023-03-23 DIAGNOSIS — L97514 Non-pressure chronic ulcer of other part of right foot with necrosis of bone: Secondary | ICD-10-CM | POA: Diagnosis not present

## 2023-03-23 DIAGNOSIS — I70235 Atherosclerosis of native arteries of right leg with ulceration of other part of foot: Secondary | ICD-10-CM | POA: Diagnosis not present

## 2023-03-23 DIAGNOSIS — E11621 Type 2 diabetes mellitus with foot ulcer: Secondary | ICD-10-CM

## 2023-03-23 NOTE — Progress Notes (Addendum)
BENDALL, [name] ([id number]) [id number] Page 1 of 3 Visit Report for 03/23/2023 HBO Details Patient Name: Date of Service: [name] 03/23/2023 12:00 PM Medical Record Number: [id number] Patient Account Number: [id number] Date of Birth/Sex: Treating RN: [birth year] (89 y.o. [name], [name] Primary Care Shundra Wirsing: [name] Other Clinician: Haywood Pao Referring Vikkie Goeden: Treating Trejon Duford/Extender: Caffie Pinto in Treatment: 37 HBO Treatment Course Details Treatment Course Number: 1 Ordering Andrey Mccaskill: Linard Millers Treatments Ordered: otal 70 HBO Treatment Start Date: 01/09/2023 HBO Indication: Diabetic Ulcer(s) of the Lower Extremity Standard/Conservative Wound Care tried and failed greater than or equal to 30 days HBO Treatment Details Treatment Number: 31 Patient Type: Outpatient Chamber Type: Monoplace Chamber Serial #: [id number] Treatment Protocol: 2.0 ATA with 90 minutes oxygen, and no air breaks Treatment Details Compression Rate Down: 1.0 psi / minute De-Compression Rate Up: 1.5 psi / minute Air breaks and breathing Decompress Decompress Compress Tx Pressure Begins Reached periods Begins Ends (leave unused spaces blank) Chamber Pressure (ATA 1 2 ------2 1 ) Clock Time (24 hr) 12:57 13:13 - - - - - - 14:43 14:53 Treatment Length: 116 (minutes) Treatment Segments: 4 Vital Signs Capillary Blood Glucose Reference Range: 80 - 120 mg / dl HBO Diabetic Blood Glucose Intervention Range: <131 mg/dl or >284 mg/dl Type: Time Vitals Blood Respiratory Capillary Blood Glucose Pulse Action Pulse: Temperature: Taken: Pressure: Rate: Glucose (mg/dl): Meter #: Oximetry (%) Taken: Pre 12:21 151/93 90 18 97.5 178 98 none per protocol Post 14:56 124/87 101 18 97.5 154 none per protocol Treatment Response Treatment Toleration: Well Treatment Completion Status: Treatment Completed without Adverse  Event Treatment Notes [name] arrived with vital signs that were within normal range. Patient prepared for treatment. After performing a safety check, patient was placed in the chamber which was compressed with 100% oxygen at a rate of 1 psi/min. [name] tolerated the treatment and subsequent decompression at a rate of 1.5 psi/min. He denied any issues with ear equalization and/or pain associated with barotrauma. His post-treatment vital signs were normal. He was stable upon discharge. Additional Procedure Documentation Tissue Sevierity: Necrosis of bone [name] Notes Evaluated patient prior to diving. Tympanic membrane clearly visualized in the ears bilaterally. Heart with regular rate rhythm no murmurs gallops or rubs. Lungs clear to auscultation to the anterior and posterior aspects. Patient had no complaints. Physician HBO Attestation: I certify that I supervised this HBO treatment in accordance with Medicare guidelines. A trained emergency response team is readily available per Yes [name], [name] ([id number]) [id number] Page 2 of 3 hospital policies and procedures. Continue HBOT as ordered. Yes Electronic Signature(s) Signed: 03/24/2023 12:22:19 PM By: [name] DO Previous Signature: 03/23/2023 4:40:33 PM Version By: Haywood Pao CHT EMT BS , , Previous Signature: 03/23/2023 4:39:28 PM Version By: Haywood Pao CHT EMT BS , , Entered By: [name] on 03/24/2023 12:21:29 -------------------------------------------------------------------------------- HBO Safety Checklist Details Patient Name: Date of Service: [name] 03/23/2023 12:00 PM Medical Record Number: [id number] Patient Account Number: [id number] Date of Birth/Sex: Treating RN: 04-13-1933 (89 y.o. [name] Primary Care Charnette Younkin: [name] Other Clinician: Haywood Pao Referring Avea Mcgowen: Treating Carie Kapuscinski/Extender: Caffie Pinto in Treatment: 37 HBO Safety Checklist Items Safety Checklist Consent Form Signed Patient voided / foley secured and emptied When did you last eato 0800 - Bacon Egg Toast Last dose of injectable or oral agent 0700 Jardiance Ostomy pouch emptied and vented if applicable NA All implantable devices assessed,  documented and approved NA Intravenous access site secured and place NA Valuables secured Linens and cotton and cotton/polyester blend (less than 51% polyester) Personal oil-based products / skin lotions / body lotions removed Wigs or hairpieces removed NA Smoking or tobacco materials removed NA Books / newspapers / magazines / loose paper removed Cologne, aftershave, perfume and deodorant removed Jewelry removed (may wrap wedding band) Make-up removed NA Hair care products removed Battery operated devices (external) removed Heating patches and chemical warmers removed Titanium eyewear removed Nail polish cured greater than 10 hours NA Casting material cured greater than 10 hours NA Hearing aids removed removed Loose dentures or partials removed dentures removed Prosthetics have been removed NA Patient demonstrates correct use of air break device (if applicable) Patient concerns have been addressed Patient grounding bracelet on and cord attached to chamber Specifics for Inpatients (complete in addition to above) Medication sheet sent with patient NA Intravenous medications needed or due during therapy sent with patient NA Drainage tubes (e.g. nasogastric tube or chest tube secured and vented) NA Endotracheal or Tracheotomy tube secured NA Cuff deflated of air and inflated with saline NA Airway suctioned NA Notes Paper version used prior to treatment start. Electronic Signature(s) Signed: 03/23/2023 4:05:08 PM By: Haywood Pao CHT EMT BS , , [name] 4:05:08 PM By: Haywood Pao CHT EMT BS Signed: 03/23/2023  ([id number]) [id number] Page 3 of 3 , , Entered By: Haywood Pao on 03/23/2023 16:05:08

## 2023-03-23 NOTE — Progress Notes (Signed)
MULDREW, Humbert ([id number]) [id number] Page 1 of 2 Visit Report for 03/23/2023 Arrival Information Details Patient Name: Date of Service: [name] [name] 03/23/2023 12:00 PM Medical Record Number: [id number] Patient Account Number: [id number] Date of Birth/Sex: Treating RN: 09/28/1933 (89 y.o. [name] Primary Care Arlin Sass: [name] Other Clinician: Haywood Pao Referring Orry Sigl: Treating Destane Speas/Extender: Caffie Pinto in Treatment: 37 Visit Information History Since Last Visit All ordered tests and consults were completed: Yes Patient Arrived: Ambulatory Added or deleted any medications: No Arrival Time: 11:59 Any new allergies or adverse reactions: No Accompanied By: self Had a fall or experienced change in No Transfer Assistance: None activities of daily living that may affect Patient Identification Verified: Yes risk of falls: Secondary Verification Process Completed: Yes Signs or symptoms of abuse/neglect since last visito No Patient Requires Transmission-Based Precautions: No Hospitalized since last visit: No Patient Has Alerts: Yes Implantable device outside of the clinic excluding No Patient Alerts: Patient on Blood Thinner cellular tissue based products placed in the center since last visit: Pain Present Now: No Electronic Signature(s) Signed: 03/23/2023 3:44:58 PM By: Haywood Pao CHT EMT BS , , Entered By: Haywood Pao on 03/23/2023 15:44:58 -------------------------------------------------------------------------------- Encounter Discharge Information Details Patient Name: Date of Service: [name] [name] 03/23/2023 12:00 PM Medical Record Number: [id number] Patient Account Number: [id number] Date of Birth/Sex: Treating RN: 05-26-1933 (89 y.o. [name] Primary Care Heatherly Stenner: [name] Other Clinician: Haywood Pao Referring  Thailan Sava: Treating Ayansh Feutz/Extender: Caffie Pinto in Treatment: 37 Encounter Discharge Information Items Discharge Condition: Stable Ambulatory Status: Ambulatory Discharge Destination: Home Transportation: Private Auto Accompanied By: daughter Schedule Follow-up Appointment: No Clinical Summary of Care: Electronic Signature(s) Signed: 03/23/2023 4:53:36 PM By: Haywood Pao CHT EMT BS , , Entered By: Haywood Pao on 03/23/2023 16:53:36 Debski, Bayron ([id number]) [id number] Page 2 of 2 -------------------------------------------------------------------------------- Vitals Details Patient Name: Date of Service: [name] [name] 03/23/2023 12:00 PM Medical Record Number: [id number] Patient Account Number: [id number] Date of Birth/Sex: Treating RN: 1933-10-26 (89 y.o. [name] Primary Care Shay Jhaveri: [name] Other Clinician: Haywood Pao Referring Cortavius Montesinos: Treating Jaselle Pryer/Extender: Caffie Pinto in Treatment: 37 Vital Signs Time Taken: 12:21 Temperature (F): 97.5 Height (in): 71 Pulse (bpm): 90 Weight (lbs): 170 Respiratory Rate (breaths/min): 18 Body Mass Index (BMI): 23.7 Blood Pressure (mmHg): 151/93 Capillary Blood Glucose (mg/dl): 433 Reference Range: 80 - 120 mg / dl Airway Pulse Oximetry (%): 98 Electronic Signature(s) Signed: 03/23/2023 3:45:29 PM By: Haywood Pao CHT EMT BS , , Entered By: Haywood Pao on 03/23/2023 15:45:29

## 2023-03-24 ENCOUNTER — Encounter (HOSPITAL_BASED_OUTPATIENT_CLINIC_OR_DEPARTMENT_OTHER): Payer: PPO | Attending: Internal Medicine | Admitting: Internal Medicine

## 2023-03-24 DIAGNOSIS — I70235 Atherosclerosis of native arteries of right leg with ulceration of other part of foot: Secondary | ICD-10-CM | POA: Insufficient documentation

## 2023-03-24 DIAGNOSIS — E11621 Type 2 diabetes mellitus with foot ulcer: Secondary | ICD-10-CM | POA: Diagnosis not present

## 2023-03-24 DIAGNOSIS — L97514 Non-pressure chronic ulcer of other part of right foot with necrosis of bone: Secondary | ICD-10-CM | POA: Diagnosis not present

## 2023-03-24 DIAGNOSIS — E1151 Type 2 diabetes mellitus with diabetic peripheral angiopathy without gangrene: Secondary | ICD-10-CM | POA: Diagnosis not present

## 2023-03-24 LAB — GLUCOSE, CAPILLARY
Glucose-Capillary: 178 mg/dL — ABNORMAL HIGH (ref 70–99)
Glucose-Capillary: 154 mg/dL — ABNORMAL HIGH (ref 70–99)

## 2023-03-24 NOTE — Progress Notes (Signed)
DEMBINSKI, Vedansh ([id number]) [id number] Page 1 of 1 Visit Report for 03/23/2023 SuperBill Details Patient Name: Date of Service: [name] [name] 03/23/2023 Medical Record Number: [id number] Patient Account Number: [id number] Date of Birth/Sex: Treating RN: [birth year] (89 y.o. [name], [name] Primary Care Provider: [name] Other Clinician: Haywood Pao Referring Provider: Treating Provider/Extender: Caffie Pinto in Treatment: 37 Diagnosis Coding ICD-10 Codes Code Description [phone] Non-pressure chronic ulcer of other part of right foot with necrosis of bone E11.621 Type 2 diabetes mellitus with foot ulcer I70.235 Atherosclerosis of native arteries of right leg with ulceration of other part of foot Facility Procedures CPT4 Code Description Modifier Quantity [id number] G0277-(Facility Use Only) HBOT full body chamber, , 4 ICD-10 Diagnosis Description E11.621 Type 2 diabetes mellitus with foot ulcer L97.514 Non-pressure chronic ulcer of other part of right foot with necrosis of bone I70.235 Atherosclerosis of native arteries of right leg with ulceration of other part of foot Physician Procedures Quantity CPT4 Code Description Modifier [id number] - WC PHYS HYPERBARIC OXYGEN THERAPY 1 ICD-10 Diagnosis Description E11.621 Type 2 diabetes mellitus with foot ulcer L97.514 Non-pressure chronic ulcer of other part of right foot with necrosis of bone I70.235 Atherosclerosis of native arteries of right leg with ulceration of other part of foot Electronic Signature(s) Signed: 03/23/2023 4:40:53 PM By: Haywood Pao CHT EMT BS , , Signed: 03/24/2023 12:22:19 PM By: [name] DO Entered By: Haywood Pao on 03/23/2023 16:40:52

## 2023-03-24 NOTE — Progress Notes (Signed)
VILL, Hamdan ([id number]) [id number] Page 1 of 2 Visit Report for 03/24/2023 Arrival Information Details Patient Name: Date of Service: [name] 03/24/2023 9:00 A M Medical Record Number: [id number] Patient Account Number: [id number] Date of Birth/Sex: Treating RN: January 11, 1934 (89 y.o. [name] Primary Care Christa Fasig: [name] Other Clinician: Haywood Pao Referring Merial Moritz: Treating Mulan Adan/Extender: Caffie Pinto in Treatment: 37 Visit Information History Since Last Visit All ordered tests and consults were completed: Yes Patient Arrived: Cane Added or deleted any medications: No Arrival Time: 08:49 Any new allergies or adverse reactions: No Accompanied By: self Had a fall or experienced change in No Transfer Assistance: None activities of daily living that may affect Patient Identification Verified: Yes risk of falls: Secondary Verification Process Completed: Yes Signs or symptoms of abuse/neglect since last visito No Patient Requires Transmission-Based Precautions: No Hospitalized since last visit: No Patient Has Alerts: Yes Implantable device outside of the clinic excluding No Patient Alerts: Patient on Blood Thinner cellular tissue based products placed in the center since last visit: Pain Present Now: No Electronic Signature(s) Signed: 03/24/2023 11:57:45 AM By: Haywood Pao CHT EMT BS , , Previous Signature: 03/24/2023 11:51:58 AM Version By: Haywood Pao CHT EMT BS , , Entered By: Haywood Pao on 03/24/2023 11:57:45 -------------------------------------------------------------------------------- Encounter Discharge Information Details Patient Name: Date of Service: [name] 03/24/2023 9:00 A M Medical Record Number: [id number] Patient Account Number: [id number] Date of Birth/Sex: Treating RN: 1933-10-07 (89 y.o. [name] Primary Care  Jelisa [place]: [name] Other Clinician: Haywood Pao Referring Leathie Weich: Treating Anyely Cunning/Extender: Caffie Pinto in Treatment: 37 Encounter Discharge Information Items Discharge Condition: Stable Ambulatory Status: Cane Discharge Destination: Home Transportation: Private Auto Accompanied By: family Schedule Follow-up Appointment: No Clinical Summary of Care: Electronic Signature(s) Signed: 03/24/2023 11:57:30 AM By: Haywood Pao CHT EMT BS , , Entered By: Haywood Pao on 03/24/2023 11:57:30 [name], [name] ([id number]) [id number] Page 2 of 2 -------------------------------------------------------------------------------- Vitals Details Patient Name: Date of Service: [name] 03/24/2023 9:00 A M Medical Record Number: [id number] Patient Account Number: [id number] Date of Birth/Sex: Treating RN: Jun 14, 1933 (89 y.o. [name] Primary Care Symphonie Schneiderman: [name] Other Clinician: Haywood Pao Referring Ijanae Macapagal: Treating Kayhan Boardley/Extender: Caffie Pinto in Treatment: 37 Vital Signs Time Taken: 09:01 Temperature (F): 98.1 Height (in): 71 Pulse (bpm): 106 Weight (lbs): 170 Respiratory Rate (breaths/min): 18 Body Mass Index (BMI): 23.7 Blood Pressure (mmHg): 117/89 Capillary Blood Glucose (mg/dl): 433 Reference Range: 80 - 120 mg / dl Electronic Signature(s) Signed: 03/24/2023 11:52:25 AM By: Haywood Pao CHT EMT BS , , Entered By: Haywood Pao on 03/24/2023 11:52:25

## 2023-03-24 NOTE — Progress Notes (Addendum)
[name], [name] ([id number]) [id number] Page 1 of 2 Visit Report for 03/24/2023 HBO Details Patient Name: Date of Service: [name] [name] 03/24/2023 9:00 A M Medical Record Number: [id number] Patient Account Number: [id number] Date of Birth/Sex: Treating RN: [birth year] (89 y.o. [name], [name] Primary Care [name]: [name] Other Clinician: [name] Referring [name]: Treating [name]/Extender: [name] in Treatment: 37 HBO Treatment Course Details Treatment Course Number: 1 Ordering [name]: [name] Treatments Ordered: otal 70 HBO Treatment Start Date: 01/09/2023 HBO Indication: Diabetic Ulcer(s) of the Lower Extremity Standard/Conservative Wound Care tried and failed greater than or equal to 30 days HBO Treatment Details Treatment Number: 32 Patient Type: Outpatient Chamber Type: Monoplace Chamber Serial #: [id number] Treatment Protocol: 2.0 ATA with 90 minutes oxygen, and no air breaks Treatment Details Compression Rate Down: 1.0 psi / minute De-Compression Rate Up: 1.5 psi / minute Air breaks and breathing Decompress Decompress Compress Tx Pressure Begins Reached periods Begins Ends (leave unused spaces blank) Chamber Pressure (ATA 1 2 ------2 1 ) Clock Time (24 hr) 09:30 09:45 - - - - - - 11:15 11:25 Treatment Length: 115 (minutes) Treatment Segments: 4 Vital Signs Capillary Blood Glucose Reference Range: 80 - 120 mg / dl HBO Diabetic Blood Glucose Intervention Range: <131 mg/dl or >601 mg/dl Type: Time Vitals Blood Respiratory Capillary Blood Glucose Pulse Action Pulse: Temperature: Taken: Pressure: Rate: Glucose (mg/dl): Meter #: Oximetry (%) Taken: Pre 09:01 117/89 106 18 98.1 138 none per protocol Post 11:28 133/89 105 16 98.1 184 none per protocol Treatment Response Treatment Toleration: Well Treatment Completion Status: Treatment Completed without Adverse  Event Treatment Notes [name] arrived with vital signs that were within normal range except heart rate of 106. Manual pulse rate was 98 bpm. Patient prepared for treatment. After performing a safety check, patient was placed in the chamber which was compressed with 100% oxygen at a rate of 1.5 psi/min after confirming normal ear equalization. [name] tolerated the treatment and subsequent decompression at a rate of 1.5 psi/min. He denied any issues with ear equalization and/or pain associated with barotrauma. His post-treatment vital signs were normal. He was stable upon discharge. Additional Procedure Documentation Tissue Sevierity: Necrosis of bone Physician HBO Attestation: I certify that I supervised this HBO treatment in accordance with Medicare guidelines. A trained emergency response team is readily available per Yes hospital policies and procedures. Continue HBOT as ordered. [name], [name] ([id number]) [id number] Page 2 of 2 Electronic Signature(s) Signed: 03/24/2023 12:22:19 PM By: [name] Corwin [name] Previous Signature: 03/24/2023 11:56:34 AM Version By: [name] CHT EMT BS , , Previous Signature: 03/24/2023 11:56:13 AM Version By: [name] CHT EMT BS , , Entered By: [name] on 03/24/2023 12:22:02 -------------------------------------------------------------------------------- HBO Safety Checklist Details Patient Name: Date of Service: [name] [name] 03/24/2023 9:00 A M Medical Record Number: [id number] Patient Account Number: [id number] Date of Birth/Sex: Treating RN: 01-26-1934 (89 y.o. [name] Primary Care [name]: [name] Other Clinician: [name] Referring [name]: Treating [name]/Extender: [name] in Treatment: 37 HBO Safety Checklist Items Safety Checklist Consent Form Signed Patient voided / foley secured and emptied When did  you last eato 0830 - Steak egg biscuit, coffee Last dose of injectable or oral agent 0700 - Jardiance Ostomy pouch emptied and vented if applicable NA All implantable devices assessed, documented and approved NA Intravenous access site secured and place NA Valuables secured Linens and cotton and cotton/polyester blend (less  than 51% polyester) Personal oil-based products / skin lotions / body lotions removed Wigs or hairpieces removed NA Smoking or tobacco materials removed NA Books / newspapers / magazines / loose paper removed Cologne, aftershave, perfume and deodorant removed Jewelry removed (may wrap wedding band) Make-up removed NA Hair care products removed Battery operated devices (external) removed Heating patches and chemical warmers removed Titanium eyewear removed Nail polish cured greater than 10 hours NA Casting material cured greater than 10 hours NA Hearing aids removed removed Loose dentures or partials removed dentures removed Prosthetics have been removed NA Patient demonstrates correct use of air break device (if applicable) Patient concerns have been addressed Patient grounding bracelet on and cord attached to chamber Specifics for Inpatients (complete in addition to above) Medication sheet sent with patient NA Intravenous medications needed or due during therapy sent with patient NA Drainage tubes (e.g. nasogastric tube or chest tube secured and vented) NA Endotracheal or Tracheotomy tube secured NA Cuff deflated of air and inflated with saline NA Airway suctioned NA Notes Paper version used prior to treatment start. Electronic Signature(s) Signed: 03/24/2023 11:54:06 AM By: [name] CHT EMT BS , , Entered By: [name] on 03/24/2023 11:54:06

## 2023-03-24 NOTE — Progress Notes (Signed)
GARNET, Arseniy ([id number]) [id number] Page 1 of 1 Visit Report for 03/24/2023 SuperBill Details Patient Name: Date of Service: [name] [name] 03/24/2023 Medical Record Number: [id number] Patient Account Number: [id number] Date of Birth/Sex: Treating RN: August 06, 1933 (89 y.o. [name], [name] Primary Care Provider: [name] Other Clinician: Haywood Pao Referring Provider: Treating Provider/Extender: Caffie Pinto in Treatment: 37 Diagnosis Coding ICD-10 Codes Code Description [phone] Non-pressure chronic ulcer of other part of right foot with necrosis of bone E11.621 Type 2 diabetes mellitus with foot ulcer I70.235 Atherosclerosis of native arteries of right leg with ulceration of other part of foot Facility Procedures CPT4 Code Description Modifier Quantity [id number] G0277-(Facility Use Only) HBOT full body chamber, , 4 ICD-10 Diagnosis Description E11.621 Type 2 diabetes mellitus with foot ulcer L97.514 Non-pressure chronic ulcer of other part of right foot with necrosis of bone I70.235 Atherosclerosis of native arteries of right leg with ulceration of other part of foot Physician Procedures Quantity CPT4 Code Description Modifier [id number] 99183 - WC PHYS HYPERBARIC OXYGEN THERAPY 1 ICD-10 Diagnosis Description E11.621 Type 2 diabetes mellitus with foot ulcer L97.514 Non-pressure chronic ulcer of other part of right foot with necrosis of bone I70.235 Atherosclerosis of native arteries of right leg with ulceration of other part of foot Electronic Signature(s) Signed: 03/24/2023 11:56:53 AM By: Haywood Pao CHT EMT BS , , Signed: 03/24/2023 12:22:19 PM By: [name] DO Entered By: Haywood Pao on 03/24/2023 11:56:52

## 2023-03-27 ENCOUNTER — Encounter (HOSPITAL_BASED_OUTPATIENT_CLINIC_OR_DEPARTMENT_OTHER): Payer: PPO | Admitting: Internal Medicine

## 2023-03-27 DIAGNOSIS — E11621 Type 2 diabetes mellitus with foot ulcer: Secondary | ICD-10-CM

## 2023-03-27 DIAGNOSIS — I70235 Atherosclerosis of native arteries of right leg with ulceration of other part of foot: Secondary | ICD-10-CM | POA: Diagnosis not present

## 2023-03-27 DIAGNOSIS — L97514 Non-pressure chronic ulcer of other part of right foot with necrosis of bone: Secondary | ICD-10-CM

## 2023-03-27 LAB — GLUCOSE, CAPILLARY
Glucose-Capillary: 138 mg/dL — ABNORMAL HIGH (ref 70–99)
Glucose-Capillary: 184 mg/dL — ABNORMAL HIGH (ref 70–99)

## 2023-03-27 NOTE — Progress Notes (Signed)
APARICIO, Mozell ([id number]) [id number] Page 1 of 1 Visit Report for 03/27/2023 SuperBill Details Patient Name: Date of Service: [name] [name] 03/27/2023 Medical Record Number: [id number] Patient Account Number: [id number] Date of Birth/Sex: Treating RN: 05/12/1933 (89 y.o. [name] Primary Care Provider: [name] Other Clinician: Haywood Pao Referring Provider: Treating Provider/Extender: Caffie Pinto in Treatment: 38 Diagnosis Coding ICD-10 Codes Code Description [phone] Non-pressure chronic ulcer of other part of right foot with necrosis of bone E11.621 Type 2 diabetes mellitus with foot ulcer I70.235 Atherosclerosis of native arteries of right leg with ulceration of other part of foot Facility Procedures CPT4 Code Description Modifier Quantity [id number] G0277-(Facility Use Only) HBOT full body chamber, , 4 ICD-10 Diagnosis Description E11.621 Type 2 diabetes mellitus with foot ulcer L97.514 Non-pressure chronic ulcer of other part of right foot with necrosis of bone I70.235 Atherosclerosis of native arteries of right leg with ulceration of other part of foot Physician Procedures Quantity CPT4 Code Description Modifier [id number] 99183 - WC PHYS HYPERBARIC OXYGEN THERAPY 1 ICD-10 Diagnosis Description E11.621 Type 2 diabetes mellitus with foot ulcer L97.514 Non-pressure chronic ulcer of other part of right foot with necrosis of bone I70.235 Atherosclerosis of native arteries of right leg with ulceration of other part of foot Electronic Signature(s) Signed: 03/27/2023 3:46:34 PM By: Haywood Pao CHT EMT BS , , Signed: 03/27/2023 3:53:22 PM By: [name] DO Entered By: Haywood Pao on 03/27/2023 15:46:34

## 2023-03-27 NOTE — Progress Notes (Addendum)
[name], [name] ([id number]) [id number] Page 1 of 2 Visit Report for 03/27/2023 HBO Details Patient Name: Date of Service: [name] [name] 03/27/2023 12:00 PM Medical Record Number: [id number] Patient Account Number: [id number] Date of Birth/Sex: Treating RN: 1933-09-02 (89 y.o. [name] Primary Care Tennis Mckinnon: [name] Other Clinician: Haywood Pao Referring Akyah Lagrange: Treating Syrena Burges/Extender: Caffie Pinto in Treatment: 38 HBO Treatment Course Details Treatment Course Number: 1 Ordering Edna Rede: Linard Millers Treatments Ordered: otal 70 HBO Treatment Start Date: 01/09/2023 HBO Indication: Diabetic Ulcer(s) of the Lower Extremity Standard/Conservative Wound Care tried and failed greater than or equal to 30 days HBO Treatment Details Treatment Number: 33 Patient Type: Outpatient Chamber Type: Monoplace Chamber Serial #: [id number] Treatment Protocol: 2.0 ATA with 90 minutes oxygen, and no air breaks Treatment Details Compression Rate Down: 1.0 psi / minute De-Compression Rate Up: 2.0 psi / minute Air breaks and breathing Decompress Decompress Compress Tx Pressure Begins Reached periods Begins Ends (leave unused spaces blank) Chamber Pressure (ATA 1 2 ------2 1 ) Clock Time (24 hr) 12:38 12:54 - - - - - - 14:24 14:35 Treatment Length: 117 (minutes) Treatment Segments: 4 Vital Signs Capillary Blood Glucose Reference Range: 80 - 120 mg / dl HBO Diabetic Blood Glucose Intervention Range: <131 mg/dl or >962 mg/dl Type: Time Vitals Blood Pulse: Respiratory Temperature: Capillary Blood Glucose Pulse Action Taken: Pressure: Rate: Glucose (mg/dl): Meter #: Oximetry (%) Taken: Pre 11:58 140/68 120 18 97.4 214 manual pulse 72 irregular beat Post 14:38 117/84 97 18 97.9 104 none per protocol Treatment Response Treatment Toleration: Well Treatment Completion Status: Treatment Completed  without Adverse Event Treatment Notes Mr. Diegel arrived with vital signs that were within normal range except heart rate of 120. Manual pulse rate was 72 bpm. Patient prepared for treatment. This tech recalled with patient that heart rate in the past was high before treatment due to pain and aversion for chamber. After performing a safety check, patient was placed in the chamber which was compressed with 100% oxygen at a rate of 1.5 psi/min after confirming normal ear equalization. Mr. Gutzmer tolerated the treatment and subsequent decompression at a rate of 1.5 psi/min. He denied any issues with ear equalization and/or pain associated with barotrauma. His post-treatment vital signs were normal. He was stable upon discharge. Family member is driving. Additional Procedure Documentation Tissue Sevierity: Necrosis of bone Physician HBO Attestation: I certify that I supervised this HBO treatment in accordance with Medicare guidelines. A trained emergency response team is readily available per Yes hospital policies and procedures. Continue HBOT as ordered. [name], [name] ([id number]) [id number] Page 2 of 2 Electronic Signature(s) Signed: 03/27/2023 3:53:22 PM By: [name] Corwin DO Previous Signature: 03/27/2023 3:48:48 PM Version By: Haywood Pao CHT EMT BS , , Previous Signature: 03/27/2023 3:46:06 PM Version By: Haywood Pao CHT EMT BS , , Entered By: [name] on 03/27/2023 15:53:10 -------------------------------------------------------------------------------- HBO Safety Checklist Details Patient Name: Date of Service: [name] [name] 03/27/2023 12:00 PM Medical Record Number: [id number] Patient Account Number: [id number] Date of Birth/Sex: Treating RN: [birth year] (89 y.o. [name] Primary Care Kaevion Sinclair: [name] Other Clinician: Haywood Pao Referring Aimee Heldman: Treating [name]/Extender: Caffie Pinto in Treatment: 38 HBO Safety Checklist Items Safety Checklist Consent Form Signed Patient voided / foley secured and emptied When did you last eato 0800 - BandE Coffee Last dose of injectable or oral agent 0800 Jardiance Ostomy pouch emptied and vented if applicable NA All  implantable devices assessed, documented and approved NA Intravenous access site secured and place NA Valuables secured Linens and cotton and cotton/polyester blend (less than 51% polyester) Personal oil-based products / skin lotions / body lotions removed Wigs or hairpieces removed NA Smoking or tobacco materials removed NA Books / newspapers / magazines / loose paper removed Cologne, aftershave, perfume and deodorant removed Jewelry removed (may wrap wedding band) Make-up removed Hair care products removed Battery operated devices (external) removed Heating patches and chemical warmers removed Titanium eyewear removed Nail polish cured greater than 10 hours NA Casting material cured greater than 10 hours NA Hearing aids removed removed Loose dentures or partials removed dentures removed Prosthetics have been removed NA Patient demonstrates correct use of air break device (if applicable) Patient concerns have been addressed Patient grounding bracelet on and cord attached to chamber Specifics for Inpatients (complete in addition to above) Medication sheet sent with patient NA Intravenous medications needed or due during therapy sent with patient NA Drainage tubes (e.g. nasogastric tube or chest tube secured and vented) NA Endotracheal or Tracheotomy tube secured NA Cuff deflated of air and inflated with saline NA Airway suctioned NA Notes Paper version used prior to treatment start. Electronic Signature(s) Signed: 03/27/2023 2:19:58 PM By: Haywood Pao CHT EMT BS , , Entered By: Haywood Pao on 03/27/2023 14:19:58

## 2023-03-27 NOTE — Progress Notes (Addendum)
KARGES, Seon ([id number]) [id number] Page 1 of 2 Visit Report for 03/27/2023 Arrival Information Details Patient Name: Date of Service: [name] 03/27/2023 12:00 PM Medical Record Number: [id number] Patient Account Number: [id number] Date of Birth/Sex: Treating RN: April 17, 1933 (89 y.o. [name] Primary Care Daimen Shovlin: [name] Other Clinician: Haywood Pao Referring Zollie Clemence: Treating Barre Aydelott/Extender: Caffie Pinto in Treatment: 8 Visit Information History Since Last Visit All ordered tests and consults were completed: Yes Patient Arrived: Cane Added or deleted any medications: No Arrival Time: 11:49 Any new allergies or adverse reactions: No Accompanied By: self Had a fall or experienced change in No Transfer Assistance: None activities of daily living that may affect Patient Identification Verified: Yes risk of falls: Secondary Verification Process Completed: Yes Signs or symptoms of abuse/neglect since last visito No Patient Requires Transmission-Based Precautions: No Hospitalized since last visit: No Patient Has Alerts: Yes Implantable device outside of the clinic excluding No Patient Alerts: Patient on Blood Thinner cellular tissue based products placed in the center since last visit: Pain Present Now: No Electronic Signature(s) Signed: 03/27/2023 2:15:08 PM By: Haywood Pao CHT EMT BS , , Entered By: Haywood Pao on 03/27/2023 14:15:08 -------------------------------------------------------------------------------- Encounter Discharge Information Details Patient Name: Date of Service: [name] 03/27/2023 12:00 PM Medical Record Number: [id number] Patient Account Number: [id number] Date of Birth/Sex: Treating RN: 12-15-1933 (89 y.o. [name] Primary Care Maeryn Mcgath: [name] Other Clinician: Haywood Pao Referring Deliyah Muckle: Treating  Syanne Looney/Extender: Caffie Pinto in Treatment: 33 Encounter Discharge Information Items Discharge Condition: Stable Ambulatory Status: Ambulatory Discharge Destination: Home Transportation: Private Auto Accompanied By: family as driver Schedule Follow-up Appointment: No Clinical Summary of Care: Electronic Signature(s) Signed: 03/27/2023 3:47:06 PM By: Haywood Pao CHT EMT BS , , Entered By: Haywood Pao on 03/27/2023 15:47:06 Brightbill, Hiro ([id number]) [id number] Page 2 of 2 -------------------------------------------------------------------------------- Vitals Details Patient Name: Date of Service: [name] 03/27/2023 12:00 PM Medical Record Number: [id number] Patient Account Number: [id number] Date of Birth/Sex: Treating RN: 1933/07/24 (89 y.o. [name] Primary Care Edelmira Gallogly: [name] Other Clinician: Haywood Pao Referring Shanard Treto: Treating Riven Mabile/Extender: Caffie Pinto in Treatment: 26 Vital Signs Time Taken: 11:58 Temperature (F): 97.4 Height (in): 71 Pulse (bpm): 120 Weight (lbs): 170 Respiratory Rate (breaths/min): 18 Body Mass Index (BMI): 23.7 Blood Pressure (mmHg): 140/68 Capillary Blood Glucose (mg/dl): 433 Reference Range: 80 - 120 mg / dl Electronic Signature(s) Signed: 03/27/2023 2:15:52 PM By: Haywood Pao CHT EMT BS , , Entered By: Haywood Pao on 03/27/2023 14:15:52

## 2023-03-28 ENCOUNTER — Encounter (HOSPITAL_BASED_OUTPATIENT_CLINIC_OR_DEPARTMENT_OTHER): Payer: PPO | Attending: Internal Medicine | Admitting: Internal Medicine

## 2023-03-28 DIAGNOSIS — M86671 Other chronic osteomyelitis, right ankle and foot: Secondary | ICD-10-CM | POA: Insufficient documentation

## 2023-03-28 DIAGNOSIS — I129 Hypertensive chronic kidney disease with stage 1 through stage 4 chronic kidney disease, or unspecified chronic kidney disease: Secondary | ICD-10-CM | POA: Diagnosis not present

## 2023-03-28 DIAGNOSIS — N183 Chronic kidney disease, stage 3 unspecified: Secondary | ICD-10-CM | POA: Diagnosis not present

## 2023-03-28 DIAGNOSIS — E1165 Type 2 diabetes mellitus with hyperglycemia: Secondary | ICD-10-CM | POA: Diagnosis not present

## 2023-03-28 DIAGNOSIS — I70235 Atherosclerosis of native arteries of right leg with ulceration of other part of foot: Secondary | ICD-10-CM | POA: Insufficient documentation

## 2023-03-28 DIAGNOSIS — E11621 Type 2 diabetes mellitus with foot ulcer: Secondary | ICD-10-CM | POA: Diagnosis present

## 2023-03-28 DIAGNOSIS — E1122 Type 2 diabetes mellitus with diabetic chronic kidney disease: Secondary | ICD-10-CM | POA: Insufficient documentation

## 2023-03-28 DIAGNOSIS — L97514 Non-pressure chronic ulcer of other part of right foot with necrosis of bone: Secondary | ICD-10-CM

## 2023-03-28 DIAGNOSIS — Z794 Long term (current) use of insulin: Secondary | ICD-10-CM | POA: Insufficient documentation

## 2023-03-28 DIAGNOSIS — Z89421 Acquired absence of other right toe(s): Secondary | ICD-10-CM | POA: Insufficient documentation

## 2023-03-28 DIAGNOSIS — E1151 Type 2 diabetes mellitus with diabetic peripheral angiopathy without gangrene: Secondary | ICD-10-CM | POA: Insufficient documentation

## 2023-03-28 DIAGNOSIS — J449 Chronic obstructive pulmonary disease, unspecified: Secondary | ICD-10-CM | POA: Diagnosis not present

## 2023-03-28 DIAGNOSIS — E1169 Type 2 diabetes mellitus with other specified complication: Secondary | ICD-10-CM | POA: Diagnosis not present

## 2023-03-28 LAB — GLUCOSE, CAPILLARY
Glucose-Capillary: 214 mg/dL — ABNORMAL HIGH (ref 70–99)
Glucose-Capillary: 104 mg/dL — ABNORMAL HIGH (ref 70–99)
Glucose-Capillary: 183 mg/dL — ABNORMAL HIGH (ref 70–99)
Glucose-Capillary: 142 mg/dL — ABNORMAL HIGH (ref 70–99)

## 2023-03-28 NOTE — Progress Notes (Addendum)
CROWDER, Cadyn ([id number]) [id number] Page 1 of 2 Visit Report for 03/28/2023 HBO Details Patient Name: Date of Service: [name] 03/28/2023 12:00 PM Medical Record Number: [id number] Patient Account Number: [id number] Date of Birth/Sex: Treating RN: Nov 11, 1933 (89 y.o. [name], [name] Primary Care Tacora Athanas: [name] Other Clinician: Haywood Pao Referring Trellis Guirguis: Treating Mendel Binsfeld/Extender: Caffie Pinto in Treatment: 38 HBO Treatment Course Details Treatment Course Number: 1 Ordering Audery Wassenaar: Linard Millers Treatments Ordered: otal 70 HBO Treatment Start Date: 01/09/2023 HBO Indication: Diabetic Ulcer(s) of the Lower Extremity Standard/Conservative Wound Care tried and failed greater than or equal to 30 days HBO Treatment Details Treatment Number: 34 Patient Type: Outpatient Chamber Type: Monoplace Chamber Serial #: [id number] Treatment Protocol: 2.0 ATA with 90 minutes oxygen, and no air breaks Treatment Details Compression Rate Down: 1.0 psi / minute De-Compression Rate Up: 1.0 psi / minute Air breaks and breathing Decompress Decompress Compress Tx Pressure Begins Reached periods Begins Ends (leave unused spaces blank) Chamber Pressure (ATA 1 2 ------2 1 ) Clock Time (24 hr) 12:29 12:45 - - - - - - 14:15 14:29 Treatment Length: 120 (minutes) Treatment Segments: 4 Vital Signs Capillary Blood Glucose Reference Range: 80 - 120 mg / dl HBO Diabetic Blood Glucose Intervention Range: <131 mg/dl or >518 mg/dl Type: Time Vitals Blood Respiratory Capillary Blood Glucose Pulse Action Pulse: Temperature: Taken: Pressure: Rate: Glucose (mg/dl): Meter #: Oximetry (%) Taken: Pre 12:02 118/88 78 20 98.8 183 none per protocol Post 14:32 126/80 82 18 97.4 142 none per protocol Treatment Response Treatment Toleration: Well Treatment Completion Status: Treatment Completed without Adverse  Event Treatment Notes [name] arrived with vital signs that were within normal range. Patient prepared for treatment. After performing a safety check, patient was placed in the chamber which was compressed with 100% oxygen at a rate of 1.5 psi/min after confirming normal ear equalization. [name] tolerated the treatment and subsequent decompression at a rate set of 1.5 psi/min, although time-wise it was a rate of 1 psi/min as ventilation rate was set to lowest setting (80 L/min) which slows the exhaust rate. He denied any issues with ear equalization and/or pain associated with barotrauma. His post-treatment vital signs were normal. He was stable upon discharge with daughter. Additional Procedure Documentation Tissue Sevierity: Necrosis of bone Physician HBO Attestation: I certify that I supervised this HBO treatment in accordance with Medicare guidelines. A trained emergency response team is readily available per Yes hospital policies and procedures. Continue HBOT as ordered. Danuel Burkes, Bradin ([id number]) [id number] Page 2 of 2 Electronic Signature(s) Signed: 03/28/2023 3:27:48 PM By: [name] DO Previous Signature: 03/28/2023 3:00:29 PM Version By: Haywood Pao CHT EMT BS , , Entered By: [name] on 03/28/2023 15:20:42 -------------------------------------------------------------------------------- HBO Safety Checklist Details Patient Name: Date of Service: [name] 03/28/2023 12:00 PM Medical Record Number: [id number] Patient Account Number: [id number] Date of Birth/Sex: Treating RN: 1933/04/16 (89 y.o. [name] Primary Care Elpidio Thielen: [name] Other Clinician: Haywood Pao Referring Bonney Berres: Treating Hesham Womac/Extender: Caffie Pinto in Treatment: 38 HBO Safety Checklist Items Safety Checklist Consent Form Signed Patient voided / foley secured and emptied When  did you last eato 0900 [name] T oast Last dose of injectable or oral agent 0800 - Jardiance Ostomy pouch emptied and vented if applicable NA All implantable devices assessed, documented and approved NA Intravenous access site secured and place NA Valuables secured Linens and cotton and cotton/polyester blend (less than  51% polyester) Personal oil-based products / skin lotions / body lotions removed Wigs or hairpieces removed NA Smoking or tobacco materials removed NA Books / newspapers / magazines / loose paper removed Cologne, aftershave, perfume and deodorant removed Jewelry removed (may wrap wedding band) Make-up removed NA Hair care products removed NA Battery operated devices (external) removed Heating patches and chemical warmers removed Titanium eyewear removed Nail polish cured greater than 10 hours NA Casting material cured greater than 10 hours NA Hearing aids removed removed Loose dentures or partials removed dentures removed Prosthetics have been removed NA Patient demonstrates correct use of air break device (if applicable) Patient concerns have been addressed Patient grounding bracelet on and cord attached to chamber Specifics for Inpatients (complete in addition to above) Medication sheet sent with patient NA Intravenous medications needed or due during therapy sent with patient NA Drainage tubes (e.g. nasogastric tube or chest tube secured and vented) NA Endotracheal or Tracheotomy tube secured NA Cuff deflated of air and inflated with saline NA Airway suctioned NA Notes Paper version used prior to treatment start. Electronic Signature(s) Signed: 03/28/2023 2:25:27 PM By: Haywood Pao CHT EMT BS , , Entered By: Haywood Pao on 03/28/2023 14:25:26

## 2023-03-28 NOTE — Progress Notes (Signed)
BERNHEISEL, Khoi ([id number]) [id number] Page 1 of 10 Visit Report for 03/28/2023 Chief Complaint Document Details Patient Name: Date of Service: [name] [name] 03/28/2023 2:30 PM Medical Record Number: [id number] Patient Account Number: [id number] Date of Birth/Sex: Treating RN: [birth year] (89 y.o. M) Primary Care Provider: [name] Other Clinician: Referring Provider: Treating Provider/Extender: Caffie Pinto in Treatment: 43 Information Obtained from: Patient Chief Complaint 07/04/2022; right foot wound Electronic Signature(s) Signed: 03/28/2023 3:27:48 PM By: [name] DO Entered By: [name] on 03/28/2023 15:21:27 -------------------------------------------------------------------------------- HPI Details Patient Name: Date of Service: [name] [name] 03/28/2023 2:30 PM Medical Record Number: [id number] Patient Account Number: [id number] Date of Birth/Sex: Treating RN: 1933-08-25 (89 y.o. M) Primary Care Provider: [name] Other Clinician: Referring Provider: Treating Provider/Extender: Caffie Pinto in Treatment: 91 History of Present Illness HPI Description: 07/04/2022 [name] is an 89 year old male with a past medical history of uncontrolled, insulin dependent type 2 diabetes, CKD stage III, and COPD that presents to the clinic for a right foot wound. He has an extensive history of peripheral arterial disease to the right leg. He has had a right superficial femoral to posterior tibial artery bypass by Dr. Elonda Husky in 2020. He had a right common femoral to plantar posterior tibial artery bypass on 02/03/2022 by Dr. Lenell Antu and at that time he also had a right toe amputation. He states this wound site had healed completely. Prior to this he had amputations to the first and second digit to the right foot. He presents today with an open wound to the  previous right toe amputation site for the past 2 months. He has been following with vein and vascular for this issue. Most recent ABIs were on 06/22/2022 that showed absent waveforms in the right lower extremity. Currently his right lower extremity bypass is occluded and no plan for another anteriorgram and has he likely has no revascularization options. Currently he has been using Betadine to the wound bed. He denies signs of infection. 07/11/2022 patient arrives in clinic today using Hydrofera Blue on the toe amputation site. The patient is revascularized and is not felt to have any other options for revascularization. We are using Hydrofera Blue and gauze. 5/13; patient presents for follow-up. He has been using Hydrofera Blue to the toe amputation site. He has had no issues. Wound is smaller. He has been approved for [facility] and was agreeable to have this placed in office today. 5/20; patient presents for follow-up. We have been using Kerecis to the wound bed. There is been improvement in healing. Unfortunately patient did not change the outer dressing for a week. There is slight maceration to the periwound. Unclear why he did not do this. 5/30; patient presents for follow-up. We have been using Kerecis to the wound bed. More maceration to the periwound today. No signs of infection. 6/10; patient presents for follow-up. He has been using Hydrofera Blue to the wound bed. He has no issues or complaints today. Maceration has almost completely resolved. 6/18; patient presents for follow-up. He has been using blast X and collagen to the wound bed without issues. Wound appears smaller today. 6/28; patient presents for follow-up. He has been using blast X and collagen to the wound bed. The wound is smaller. 7/11; patient presents for follow-up. He has been using blast X and collagen to the wound bed. Wound is smaller. He has slough accumulation. 7/25; patient presents for follow-up. He has been using blast  X and collagen to the wound bed. Wound is stable. He denies signs of infection. [name], [name] ([id number]) [id number] Page 2 of 10 8/26; patient presents for follow-up. He has been using blast X and collagen to the wound bed. Wound is slightly smaller. He has no issues or complaints. 8/29; patient presents for follow-up. He has been using blast X and Aquacel Ag to the wound bed. He has been using a Velcro wrap at his toes to keep the toes closer together to help with wound healing. He has no issues or complaints today. Wound is smaller. 9/12; patient presents for follow-up. He has been using blast X and Aquacel Ag to the wound bed. Unfortunately he has developed a pressure injury to the lateral aspect of the foot. He is not sure how this started. No open wound there. He denies pain. 9/23; this patient has a wound on the right foot at the amputation site of her third toe. It is right up against the fourth toe making it somewhat difficult to dress. When he was here last time he had a blister/DTI on the lateral aspect of the fifth MTP 9/30; patient has a wound on the right foot amputation site of his third toe. Probably in the third fourth webspace. I changed him to endoform last week. The wound looks somewhat better The area over the lateral part of the fifth metatarsal head which was blistered with some central deep tissue looking injury was leaking fluid today. The blister was unroofed. I used pickups and scissors to remove this. I do not know that there is anything technically open here although there is certainly some central tissue at risk 10/7; his original wound at the amputation site in his third fourth webspace looks stable to improved. We have been using endoform. The real concern today is the area over the lateral part of the fifth metatarsal head. This initially looked like a blister with central deep tissue injury I unroofed the blister last week there was  nothing technically open but the deep tissue injury was concerning. T oday he comes in with a necrotic surface I remove this. The central part of this area which I am assuming was the deep tissue injury is necrotic I removed some of the necrotic tissue with a #15 scalpel and pickups. There is undermining around the wound edge here. 10/14; his original wound at the amputation site of his third fourth toe webspace looks improved we have gradually been making progress here. Using endoform. Unfortunately the area over the lateral part of the right fifth metatarsal head continues to deteriorate. Necrotic surface last week continues this week. The PCR culture I did last week did not manage to make it to the right lab. I have looked at his x-ray the bone underneath does not look that bad to me however we have not yet had a radiologist interpretation. We have been using Iodoflex this week. 10/21; the original amputation site of his third fourth toe webspace continues to look reasonably healthy and making slow but steady progress we have been using endoform The area on the right lateral fifth metatarsal head has been deteriorating problem. PCR culture grew MRSA. I have him on doxycycline. There is exposed bone in the wound under necrotic material. I still do not have the x-ray back from October 7 at least in terms of the report. Addendum 12/27/2022; I have looked back on this patient's vascular status. He is status post a right common femoral to posterior tibial artery  bypass in 2020. He had a right common femoral to plantar posterior tibial bypass by Dr. Lenell Antu in November 2023. He also had a right third toe amputation at the same time. The wound that we have been dealing with the third toe amputation site was present after the surgery. He was last seen by vascular surgery on 06/21/2022 and was supposed to follow-up with Dr. Lenell Antu in 3 weeks although I do not see that appointment. I have included the major  findings in the last right lower extremity angiogram on 01/21/2022 10/28;Marland Kitchen The x-ray report that I ordered several weeks ago is back. Indeed there was nothing under the fifth met head on the right however notable for bone necrosis under the wound on the left third toe. This suggest osteomyelitis. I am going to extend his doxycycline for 3 further weeks. PCR culture on the lateral foot grew MRSA I sent a secure text message to Dr. Juanetta Gosling. His reply was that there was no additional options for revascularization. The patient went to see Dr. Juanetta Gosling and that is the message they came up with. He did offer him an amputation [o Above-knee]. We have been using endoform to the third toe amputation site. Silver alginate to the new wound over the fifth metatarsal head 11/4; patient continues on doxycycline which she is handling well. He started hyperbaric oxygen today and tolerated the first treatment well. We have been using endoform to the third toe amputation site which was the original wound he came to the clinic for. We are using Santyl and silver alginate to the wound over the fifth metatarsal head which was the wound that opened while he was under observation. Culture grew MRSA and the wound over the fifth metatarsal head 11/11 continuing doxycycline. He is tolerating hyperbarics although I am continually notified about tachycardia in the 100-110 range. Looking back on EKGs shows this is a longstanding problem sinus tachycardia. He also has left anterior fascicular block. He does not describe chest pain The original wound on the third toe amputation site looks a lot better this is contracting however the area on the right lateral fifth metatarsal head only has a rim of height of granulation tissue around what looks to be periosteum 11/18; continuing doxycycline. He is tolerating hyperbarics. The original wound on the third toe amputation site is very small and healthy however the wound the developed on  the right lateral fifth met head which probably was a combination of infection and ischemic has a nonviable surface. We have been using Hydrofera Blue and Santyl he continues on doxycycline. The doxycycline was same that the x-ray from 10/31 showing high-grade phalangeal bone erosion suspicious for osteomyelitis. At that time there was nothing demonstrated on the fifth met head. I have repeated the x-ray of the foot looking specifically at the fifth met head however the report is not available. 12/3; the patient's original wound on the amputation site of his third toe is closed overall though it is a slight surface eschar I did not remove this. The problematic area on the right lateral fifth metatarsal head remains the major issue here. He is not a candidate for further revascularization. He has what looks to be some viable granulation over the bone which is an improvement. However extending into the superior part of his foot a large open area over the bone itself. We used Apligraf #1 today. He continues with HBO 12/10; the original wound on the amputation site is eschared over. There is probably a small  open area remaining here but I did not debride this. The original osteomyelitis was in this area. The subsequent wound is over the right lateral fifth metatarsal head. We applied Apligraf #1 last week. The wound has improved. The option for this patient was amputation at some level. He thinks he was told that this would be above-knee certainly complicating to decide in that direction. Fortunately his wound looks somewhat better today. No obvious infection 12/17; his original wound on the amputation site of his third toe is still eschared over but I have not disturb this. The original osteomyelitis that we are treating him for was in this area. The subsequent wound is over the right lateral fifth met head. This was a combination of ischemic and infected wound that developed rapidly from 1 week to the  next in this clinic. HERRIOTT, Hasten ([id number]) [id number] Page 3 of 10 12/23; his original wound on the amputation site of his third toe is healed. This is the area with underlying osteomyelitis that he completed hyperbaric oxygen for on Friday. During the course of his stay however he developed a rapidly progressive wound on the right lateral fifth metatarsal head which was a combination of ischemia probably with coexistent infection. I gave him 6 weeks of doxycycline. We extended HBO. And I have been applying Apligraf to this area [#4 to this area today]. We have managed to get the wound to contract however there is still precious little if any tissue over this bone which in itself does not feel completely viable. He is not a candidate for a ray amputation secondary to known PAD. I verified this with vein and vascular Dr. Juanetta Gosling. 03/07/2023: Apparently the wound looks better today. There is tissue coverage of the exposed bone. The depth of the wound has come in overall, but there is an increase in the undermining towards the dorsal part of his foot. There is nonviable tendon exposed. 03/14/2023; Patient presents for follow-up. The lateral right foot wound has significantly declined in appearance. Bone is exposed and nonviable. There is mild odor and patient reports increased pain. Apligraf was placed in standard fashion at last clinic visit. 03/21/2023; patient presents for follow-up. At last clinic visit patient's wound had bone exposed and a biopsy showed acute osteomyelitis and culture grew Staph aureus sensitive to doxycycline. Patient reports improvement in his chronic pain as well as wound healing over the past week. He has been re-approved for hyperbaric oxygen therapy and he is starting this again tomorrow. 03/28/2023; patient presents for follow-up. He has been taking doxycycline as prescribed. He continues to report improvement in his chronic pain. There  is healthier granulation tissue. He continues to do hyperbaric oxygen therapy without issues. Electronic Signature(s) Signed: 03/28/2023 3:27:48 PM By: [name] DO Entered By: [name] on 03/28/2023 15:21:59 -------------------------------------------------------------------------------- Physical Exam Details Patient Name: Date of Service: [name] [name] 03/28/2023 2:30 PM Medical Record Number: [id number] Patient Account Number: [id number] Date of Birth/Sex: Treating RN: 1933-09-17 (89 y.o. M) Primary Care Provider: [name] Other Clinician: Referring Provider: Treating Provider/Extender: Caffie Pinto in Treatment: 36 Constitutional respirations regular, non-labored and within target range for patient.. Cardiovascular 2+ dorsalis pedis/posterior tibialis pulses. Psychiatric pleasant and cooperative. Notes T the lateral right foot there is an open wound with granulation tissue and minimal exposed bone. No surrounding signs of infection including increased warmth, o erythema or purulent drainage. Minimal pain on palpation. Electronic Signature(s) Signed: 03/28/2023 3:27:48 PM By: [name] DO Entered By:  [name] on 03/28/2023 15:22:43 -------------------------------------------------------------------------------- Physician Orders Details Patient Name: Date of Service: [name] [name] 03/28/2023 2:30 PM Medical Record Number: [id number] Patient Account Number: [id number] Date of Birth/Sex: Treating RN: [birth year] (89 y.o. [name] Primary Care Provider: [name] Other Clinician: Referring Provider: Treating Provider/Extender: Caffie Pinto in Treatment: 67 The following information was scribed by: Shawn Stall The information was scribed for: [name] Verbal / Phone Orders: No SPILLER, Finis ([id number]) [id number] Page 4  of 10 Diagnosis Coding ICD-10 Coding Code Description L97.514 Non-pressure chronic ulcer of other part of right foot with necrosis of bone E11.621 Type 2 diabetes mellitus with foot ulcer I70.235 Atherosclerosis of native arteries of right leg with ulceration of other part of foot Follow-up Appointments ppointment in 1 week. - Dr. Mikey Bussing (front office to schedule) Return A ppointment in 2 weeks. - Dr. Mikey Bussing (front office to schedule) Return A Other: - [employer] Continue taking the doxycycline Anesthetic (In clinic) Topical Lidocaine 5% applied to wound bed Cellular or Tissue Based Products Wound #4 Right,Lateral Foot Cellular or Tissue Based Product Type: - apligraf-applied #1 02/08/2023 apligraf-applied #2 02/14/23 Apligraf- applied #3 02/21/2023 Apligraf- applied #4 02/27/2023 Apligraf #5 applied 03/07/23 Bathing/ Shower/ Hygiene May shower with protection but do not get wound dressing(s) wet. Protect dressing(s) with water repellant cover (for example, large plastic bag) or a cast cover and may then take shower. Off-Loading Open toe surgical shoe to: - right foot. wear while walking and standing. minimize pressure and walking/ standing. Hyperbaric Oxygen Therapy Wound #4 Right,Lateral Foot Evaluate for HBO Therapy - Wag grade 3 2.0 ATA for 90 Minutes with 2 Five (5) Minute A Breaks ir Total Number of Treatments: - 40 treatments One treatments per day (delivered Monday through Friday unless otherwise specified in Special Instructions below): Finger stick Blood Glucose Pre- and Post- HBOT Treatment. Follow Hyperbaric Oxygen Glycemia Protocol Give two 4oz orange juices in addition to Glucerna when the glycemic protocol is used. Wound Treatment Wound #4 - Foot Wound Laterality: Right, Lateral Cleanser: Soap and Water 1 x Per Day/30 Days Discharge Instructions: May shower and wash wound with dial antibacterial soap and water prior to dressing change. Cleanser: Vashe  5.8 (oz) 1 x Per Day/30 Days Discharge Instructions: wet to dry with VASHE Cleanser: Wound Cleanser 1 x Per Day/30 Days Discharge Instructions: Cleanse the wound with wound cleanser prior to applying a clean dressing using gauze sponges, not tissue or cotton balls. Peri-Wound Care: Skin Prep 1 x Per Day/30 Days Discharge Instructions: Use skin prep as directed Prim Dressing: wet to dry Vashe ary 1 x Per Day/30 Days Secondary Dressing: ABD Pad, 5x9 1 x Per Day/30 Days Discharge Instructions: Apply over primary dressing as directed. Secured With: [employer], 4.5x3.1 (in/yd) 1 x Per Day/30 Days Discharge Instructions: Secure with Kerlix as directed. Secured With: 18M Medipore H Soft Cloth Surgical T ape, 4 x 10 (in/yd) 1 x Per Day/30 Days Discharge Instructions: Secure with tape as directed. GLYCEMIA INTERVENTIONS PROTOCOL PRE-HBO GLYCEMIA INTERVENTIONS ACTION INTERVENTION Obtain pre-HBO capillary blood glucose (ensure 1 physician order is in chart). A. Notify HBO physician and await physician orders. 2 If result is 70 mg/dl or below: B. If the result meets the hospital definition of a SASS, [place] ([id number]) [id number] Page 5 of 10 critical result, follow hospital policy. A. Give patient an 8 ounce Glucerna Shake, an 8 ounce Ensure, or 8 ounces of a Glucerna/Ensure equivalent dietary supplement*. B.  Wait 30 minutes. If result is 71 mg/dl to 657 mg/dl: C. Retest patients capillary blood glucose (CBG). D. If result greater than or equal to 110 mg/dl, proceed with HBO. If result less than 110 mg/dl, notify HBO physician and consider holding HBO. If result is 131 mg/dl to 846 mg/dl: A. Proceed with HBO. A. Notify HBO physician and await physician orders. B. It is recommended to hold HBO and do If result is 250 mg/dl or greater: blood/urine ketone testing. C. If the result meets the hospital definition of a critical result, follow  hospital policy. POST-HBO GLYCEMIA INTERVENTIONS ACTION INTERVENTION Obtain post HBO capillary blood glucose (ensure 1 physician order is in chart). A. Notify HBO physician and await physician orders. 2 If result is 70 mg/dl or below: B. If the result meets the hospital definition of a critical result, follow hospital policy. A. Give patient an 8 ounce Glucerna Shake, an 8 ounce Ensure, or 8 ounces of a Glucerna/Ensure equivalent dietary supplement*. B. Wait 15 minutes for symptoms of If result is 71 mg/dl to 962 mg/dl: hypoglycemia (i.e. nervousness, anxiety, sweating, chills, clamminess, irritability, confusion, tachycardia or dizziness). C. If patient asymptomatic, discharge patient. If patient symptomatic, repeat capillary blood glucose (CBG) and notify HBO physician. If result is 101 mg/dl to 952 mg/dl: A. Discharge patient. A. Notify HBO physician and await physician orders. B. It is recommended to do blood/urine ketone If result is 250 mg/dl or greater: testing. C. If the result meets the hospital definition of a critical result, follow hospital policy. *Juice or candies are NOT equivalent products. If patient refuses the Glucerna or Ensure, please consult the hospital dietitian for an appropriate substitute. Electronic Signature(s) Signed: 03/28/2023 3:27:48 PM By: [name] DO Entered By: [name] on 03/28/2023 15:23:11 -------------------------------------------------------------------------------- Problem List Details Patient Name: Date of Service: [name] [name] 03/28/2023 2:30 PM Medical Record Number: [id number] Patient Account Number: [id number] Date of Birth/Sex: Treating RN: 08-Nov-1933 (89 y.o. [name] Primary Care Provider: [name] Other Clinician: Referring Provider: Treating Provider/Extender: Caffie Pinto in Treatment: 5 Active Problems ICD-10 Encounter Code Description Active Date  MDM Diagnosis L97.514 Non-pressure chronic ulcer of other part of right foot with necrosis of bone 12/26/2022 No Yes E11.621 Type 2 diabetes mellitus with foot ulcer 07/04/2022 No Yes Wyble, Marvens ([id number]) [phone].pdf Page 6 of 10 I70.235 Atherosclerosis of native arteries of right leg with ulceration of other part of 07/04/2022 No Yes foot Inactive Problems ICD-10 Code Description Active Date Inactive Date L97.512 Non-pressure chronic ulcer of other part of right foot with fat layer exposed 07/04/2022 07/04/2022 Resolved Problems Electronic Signature(s) Signed: 03/28/2023 3:27:48 PM By: [name] DO Entered By: [name] on 03/28/2023 15:21:14 -------------------------------------------------------------------------------- Progress Note Details Patient Name: Date of Service: [name] [name] 03/28/2023 2:30 PM Medical Record Number: [id number] Patient Account Number: [id number] Date of Birth/Sex: Treating RN: 10-13-1933 (89 y.o. M) Primary Care Provider: [name] Other Clinician: Referring Provider: Treating Provider/Extender: Caffie Pinto in Treatment: 10 Subjective Chief Complaint Information obtained from Patient 07/04/2022; right foot wound History of Present Illness (HPI) 07/04/2022 Mr. Brentley Minckler is an 89 year old male with a past medical history of uncontrolled, insulin dependent type 2 diabetes, CKD stage III, and COPD that presents to the clinic for a right foot wound. He has an extensive history of peripheral arterial disease to the right leg. He has had a right superficial femoral to posterior tibial artery bypass by Dr. Elonda Husky  in 2020. He had a right common femoral to plantar posterior tibial artery bypass on 02/03/2022 by Dr. Lenell Antu and at that time he also had a right toe amputation. He states this wound site had healed completely. Prior to this he had amputations to the first  and second digit to the right foot. He presents today with an open wound to the previous right toe amputation site for the past 2 months. He has been following with vein and vascular for this issue. Most recent ABIs were on 06/22/2022 that showed absent waveforms in the right lower extremity. Currently his right lower extremity bypass is occluded and no plan for another anteriorgram and has he likely has no revascularization options. Currently he has been using Betadine to the wound bed. He denies signs of infection. 07/11/2022 patient arrives in clinic today using Hydrofera Blue on the toe amputation site. The patient is revascularized and is not felt to have any other options for revascularization. We are using Hydrofera Blue and gauze. 5/13; patient presents for follow-up. He has been using Hydrofera Blue to the toe amputation site. He has had no issues. Wound is smaller. He has been approved for [facility] and was agreeable to have this placed in office today. 5/20; patient presents for follow-up. We have been using Kerecis to the wound bed. There is been improvement in healing. Unfortunately patient did not change the outer dressing for a week. There is slight maceration to the periwound. Unclear why he did not do this. 5/30; patient presents for follow-up. We have been using Kerecis to the wound bed. More maceration to the periwound today. No signs of infection. 6/10; patient presents for follow-up. He has been using Hydrofera Blue to the wound bed. He has no issues or complaints today. Maceration has almost completely resolved. 6/18; patient presents for follow-up. He has been using blast X and collagen to the wound bed without issues. Wound appears smaller today. 6/28; patient presents for follow-up. He has been using blast X and collagen to the wound bed. The wound is smaller. 7/11; patient presents for follow-up. He has been using blast X and collagen to the wound bed. Wound is smaller. He has  slough accumulation. 7/25; patient presents for follow-up. He has been using blast X and collagen to the wound bed. Wound is stable. He denies signs of infection. 8/26; patient presents for follow-up. He has been using blast X and collagen to the wound bed. Wound is slightly smaller. He has no issues or complaints. 8/29; patient presents for follow-up. He has been using blast X and Aquacel Ag to the wound bed. He has been using a Velcro wrap at his toes to keep the toes HEITZMAN, Yan ([id number]) [phone].pdf Page 7 of 10 closer together to help with wound healing. He has no issues or complaints today. Wound is smaller. 9/12; patient presents for follow-up. He has been using blast X and Aquacel Ag to the wound bed. Unfortunately he has developed a pressure injury to the lateral aspect of the foot. He is not sure how this started. No open wound there. He denies pain. 9/23; this patient has a wound on the right foot at the amputation site of her third toe. It is right up against the fourth toe making it somewhat difficult to dress. When he was here last time he had a blister/DTI on the lateral aspect of the fifth MTP 9/30; patient has a wound on the right foot amputation site of his third toe. Probably in  the third fourth webspace. I changed him to endoform last week. The wound looks somewhat better The area over the lateral part of the fifth metatarsal head which was blistered with some central deep tissue looking injury was leaking fluid today. The blister was unroofed. I used pickups and scissors to remove this. I do not know that there is anything technically open here although there is certainly some central tissue at risk 10/7; his original wound at the amputation site in his third fourth webspace looks stable to improved. We have been using endoform. The real concern today is the area over the lateral part of the fifth metatarsal head. This initially looked like a  blister with central deep tissue injury I unroofed the blister last week there was nothing technically open but the deep tissue injury was concerning. T oday he comes in with a necrotic surface I remove this. The central part of this area which I am assuming was the deep tissue injury is necrotic I removed some of the necrotic tissue with a #15 scalpel and pickups. There is undermining around the wound edge here. 10/14; his original wound at the amputation site of his third fourth toe webspace looks improved we have gradually been making progress here. Using endoform. Unfortunately the area over the lateral part of the right fifth metatarsal head continues to deteriorate. Necrotic surface last week continues this week. The PCR culture I did last week did not manage to make it to the right lab. I have looked at his x-ray the bone underneath does not look that bad to me however we have not yet had a radiologist interpretation. We have been using Iodoflex this week. 10/21; the original amputation site of his third fourth toe webspace continues to look reasonably healthy and making slow but steady progress we have been using endoform The area on the right lateral fifth metatarsal head has been deteriorating problem. PCR culture grew MRSA. I have him on doxycycline. There is exposed bone in the wound under necrotic material. I still do not have the x-ray back from October 7 at least in terms of the report. Addendum 12/27/2022; I have looked back on this patient's vascular status. He is status post a right common femoral to posterior tibial artery bypass in 2020. He had a right common femoral to plantar posterior tibial bypass by Dr. Lenell Antu in November 2023. He also had a right third toe amputation at the same time. The wound that we have been dealing with the third toe amputation site was present after the surgery. He was last seen by vascular surgery on 06/21/2022 and was supposed to follow-up with Dr.  Lenell Antu in 3 weeks although I do not see that appointment. I have included the major findings in the last right lower extremity angiogram on 01/21/2022 10/28;Marland Kitchen The x-ray report that I ordered several weeks ago is back. Indeed there was nothing under the fifth met head on the right however notable for bone necrosis under the wound on the left third toe. This suggest osteomyelitis. I am going to extend his doxycycline for 3 further weeks. PCR culture on the lateral foot grew MRSA I sent a secure text message to Dr. Juanetta Gosling. His reply was that there was no additional options for revascularization. The patient went to see Dr. Juanetta Gosling and that is the message they came up with. He did offer him an amputation [o Above-knee]. We have been using endoform to the third toe amputation site. Silver alginate to the new  wound over the fifth metatarsal head 11/4; patient continues on doxycycline which she is handling well. He started hyperbaric oxygen today and tolerated the first treatment well. We have been using endoform to the third toe amputation site which was the original wound he came to the clinic for. We are using Santyl and silver alginate to the wound over the fifth metatarsal head which was the wound that opened while he was under observation. Culture grew MRSA and the wound over the fifth metatarsal head 11/11 continuing doxycycline. He is tolerating hyperbarics although I am continually notified about tachycardia in the 100-110 range. Looking back on EKGs shows this is a longstanding problem sinus tachycardia. He also has left anterior fascicular block. He does not describe chest pain The original wound on the third toe amputation site looks a lot better this is contracting however the area on the right lateral fifth metatarsal head only has a rim of height of granulation tissue around what looks to be periosteum 11/18; continuing doxycycline. He is tolerating hyperbarics. The original wound on the  third toe amputation site is very small and healthy however the wound the developed on the right lateral fifth met head which probably was a combination of infection and ischemic has a nonviable surface. We have been using Hydrofera Blue and Santyl he continues on doxycycline. The doxycycline was same that the x-ray from 10/31 showing high-grade phalangeal bone erosion suspicious for osteomyelitis. At that time there was nothing demonstrated on the fifth met head. I have repeated the x-ray of the foot looking specifically at the fifth met head however the report is not available. 12/3; the patient's original wound on the amputation site of his third toe is closed overall though it is a slight surface eschar I did not remove this. The problematic area on the right lateral fifth metatarsal head remains the major issue here. He is not a candidate for further revascularization. He has what looks to be some viable granulation over the bone which is an improvement. However extending into the superior part of his foot a large open area over the bone itself. We used Apligraf #1 today. He continues with HBO 12/10; the original wound on the amputation site is eschared over. There is probably a small open area remaining here but I did not debride this. The original osteomyelitis was in this area. The subsequent wound is over the right lateral fifth metatarsal head. We applied Apligraf #1 last week. The wound has improved. The option for this patient was amputation at some level. He thinks he was told that this would be above-knee certainly complicating to decide in that direction. Fortunately his wound looks somewhat better today. No obvious infection 12/17; his original wound on the amputation site of his third toe is still eschared over but I have not disturb this. The original osteomyelitis that we are treating him for was in this area. The subsequent wound is over the right lateral fifth met head. This was a  combination of ischemic and infected wound that developed rapidly from 1 week to the next in this clinic. 12/23; his original wound on the amputation site of his third toe is healed. This is the area with underlying osteomyelitis that he completed hyperbaric oxygen for on Friday. During the course of his stay however he developed a rapidly progressive wound on the right lateral fifth metatarsal head which was a combination of ischemia probably with coexistent infection. I gave him 6 weeks of doxycycline. We extended HBO.  And I have been applying Apligraf to this area [#4 to this area today]. We have managed to get the wound to contract however there is still precious little if any tissue over this bone which in itself does Tumacacori-Carmen, Carmelo ([id number]) [id number] Page 8 of 10 not feel completely viable. He is not a candidate for a ray amputation secondary to known PAD. I verified this with vein and vascular Dr. Juanetta Gosling. 03/07/2023: Apparently the wound looks better today. There is tissue coverage of the exposed bone. The depth of the wound has come in overall, but there is an increase in the undermining towards the dorsal part of his foot. There is nonviable tendon exposed. 03/14/2023; Patient presents for follow-up. The lateral right foot wound has significantly declined in appearance. Bone is exposed and nonviable. There is mild odor and patient reports increased pain. Apligraf was placed in standard fashion at last clinic visit. 03/21/2023; patient presents for follow-up. At last clinic visit patient's wound had bone exposed and a biopsy showed acute osteomyelitis and culture grew Staph aureus sensitive to doxycycline. Patient reports improvement in his chronic pain as well as wound healing over the past week. He has been re-approved for hyperbaric oxygen therapy and he is starting this again tomorrow. 03/28/2023; patient presents for follow-up. He has been taking  doxycycline as prescribed. He continues to report improvement in his chronic pain. There is healthier granulation tissue. He continues to do hyperbaric oxygen therapy without issues. Objective Constitutional respirations regular, non-labored and within target range for patient.. Vitals Time Taken: 2:55 PM, Height: 71 in, Weight: 170 lbs, BMI: 23.7. General Notes: see post vital signs from HBO. Cardiovascular 2+ dorsalis pedis/posterior tibialis pulses. Psychiatric pleasant and cooperative. General Notes: T the lateral right foot there is an open wound with granulation tissue and minimal exposed bone. No surrounding signs of infection including o increased warmth, erythema or purulent drainage. Minimal pain on palpation. Integumentary (Hair, Skin) Wound #4 status is Open. Original cause of wound was Shear/Friction. The date acquired was: 11/21/2022. The wound has been in treatment 16 weeks. The wound is located on the Right,Lateral Foot. The wound measures 2cm length x 1.3cm width x 0.3cm depth; 2.042cm^2 area and 0.613cm^3 volume. There is bone and Fat Layer (Subcutaneous Tissue) exposed. There is no tunneling noted, however, there is undermining starting at 5:00 and ending at 7:00 with a maximum distance of 0.2cm. There is a medium amount of serosanguineous drainage noted. The wound margin is distinct with the outline attached to the wound base. There is medium (34-66%) red, pink granulation within the wound bed. There is a medium (34-66%) amount of necrotic tissue within the wound bed including Adherent Slough. The periwound skin appearance exhibited: Callus, Maceration, Rubor. The periwound skin appearance did not exhibit: Crepitus, Excoriation, Induration, Rash, Scarring, Dry/Scaly, Atrophie Blanche, Cyanosis, Ecchymosis, Hemosiderin Staining, Mottled, Pallor, Erythema. Periwound temperature was noted as No Abnormality. Assessment Active Problems ICD-10 Non-pressure chronic ulcer of  other part of right foot with necrosis of bone Type 2 diabetes mellitus with foot ulcer Atherosclerosis of native arteries of right leg with ulceration of other part of foot Patient's wound has healthier granulation tissue at the wound bed. Bone is almost completely covered. I recommended continuing Vashe wet-to-dry dressings. Continue oral doxycycline to complete 4 weeks. Continue hyperbaric oxygen therapy. Continue offloading with surgical shoe. Follow-up in 1 week. Plan Follow-up Appointments: Return Appointment in 1 week. - Dr. Mikey Bussing (front office to schedule) Return Appointment in 2 weeks. - Dr. Mikey Bussing (  front office to schedule) Other: - [employer] Continue taking the doxycycline Anesthetic: (In clinic) Topical Lidocaine 5% applied to wound bed Cellular or Tissue Based Products: Wound #4 Right,Lateral Foot: Cellular or Tissue Based Product Type: - apligraf-applied #1 02/08/2023 apligraf-applied #2 02/14/23 Apligraf- applied #3 02/21/2023 Apligraf- applied #4 02/27/2023 Apligraf #5 applied 03/07/23 Bathing/ Shower/ Hygiene: May shower with protection but do not get wound dressing(s) wet. Protect dressing(s) with water repellant cover (for example, large plastic bag) or a cast cover LASKOSKI, Frederico ([id number]) [phone].pdf Page 9 of 10 and may then take shower. Off-Loading: Open toe surgical shoe to: - right foot. wear while walking and standing. minimize pressure and walking/ standing. Hyperbaric Oxygen Therapy: Wound #4 Right,Lateral Foot: Evaluate for HBO Therapy - Wag grade 3 2.0 ATA for 90 Minutes with 2 Five (5) Minute Air Breaks T Number of Treatments: - 40 treatments otal One treatments per day (delivered Monday through Friday unless otherwise specified in Special Instructions below): Finger stick Blood Glucose Pre- and Post- HBOT Treatment. Follow Hyperbaric Oxygen Glycemia Protocol Give two 4oz orange juices in addition to Glucerna  when the glycemic protocol is used. WOUND #4: - Foot Wound Laterality: Right, Lateral Cleanser: Soap and Water 1 x Per Day/30 Days Discharge Instructions: May shower and wash wound with dial antibacterial soap and water prior to dressing change. Cleanser: Vashe 5.8 (oz) 1 x Per Day/30 Days Discharge Instructions: wet to dry with VASHE Cleanser: Wound Cleanser 1 x Per Day/30 Days Discharge Instructions: Cleanse the wound with wound cleanser prior to applying a clean dressing using gauze sponges, not tissue or cotton balls. Peri-Wound Care: Skin Prep 1 x Per Day/30 Days Discharge Instructions: Use skin prep as directed Prim Dressing: wet to dry Vashe 1 x Per Day/30 Days ary Secondary Dressing: ABD Pad, 5x9 1 x Per Day/30 Days Discharge Instructions: Apply over primary dressing as directed. Secured With: [employer], 4.5x3.1 (in/yd) 1 x Per Day/30 Days Discharge Instructions: Secure with Kerlix as directed. Secured With: 44M Medipore H Soft Cloth Surgical T ape, 4 x 10 (in/yd) 1 x Per Day/30 Days Discharge Instructions: Secure with tape as directed. 1. Vashe wet-to-dry dressings 2. Continue oral doxycycline 3. Continue aggressive offloadingsurgical shoe 4. Continue hyperbaric oxygen therapy 5. Follow-up in 1 week Electronic Signature(s) Signed: 03/28/2023 3:27:48 PM By: [name] DO Entered By: [name] on 03/28/2023 15:24:20 -------------------------------------------------------------------------------- SuperBill Details Patient Name: Date of Service: [name] [name] 03/28/2023 Medical Record Number: [id number] Patient Account Number: [id number] Date of Birth/Sex: Treating RN: February 08, 1934 (89 y.o. [name], [name] Primary Care Provider: [name] Other Clinician: Referring Provider: Treating Provider/Extender: Caffie Pinto in Treatment: 30 Diagnosis Coding ICD-10 Codes Code Description [phone] Non-pressure chronic ulcer  of other part of right foot with necrosis of bone E11.621 Type 2 diabetes mellitus with foot ulcer I70.235 Atherosclerosis of native arteries of right leg with ulceration of other part of foot Facility Procedures : CPT4 Code: [id number] Description: 99213 - WOUND CARE VISIT-LEV 3 EST PT Modifier: Quantity: 1 Physician Procedures : CPT4 Code Description Modifier [id number] 99213 - WC PHYS LEVEL 3 - EST PT ICD-10 Diagnosis Description L97.514 Non-pressure chronic ulcer of other part of right foot with necrosis of bone E11.621 Type 2 diabetes mellitus with foot ulcer Hoge,  Zailen ([id number]) [id number]. I70.235 Atherosclerosis of native arteries of right leg with ulceration of other part of foot Quantity: 1 pdf Page 10 of 10 Electronic Signature(s) Signed: 03/28/2023 3:27:48 PM By: Mikey Bussing,  Shanda Bumps DO Entered By: [name] on 03/28/2023 15:24:31

## 2023-03-28 NOTE — Progress Notes (Signed)
[name], [name] ([id number]) [id number] Page 1 of 10 Visit Report for 03/28/2023 Arrival Information Details Patient Name: Date of Service: [name] 03/28/2023 2:30 PM Medical Record Number: [id number] Patient Account Number: [id number] Date of Birth/Sex: Treating RN: 1934-01-08 (89 y.o. [name] Primary Care [name]: [name] Other Clinician: Referring [name]: Treating [name]/Extender: [name] in Treatment: 16 Visit Information History Since Last Visit Added or deleted any medications: No Patient Arrived: Cane Any new allergies or adverse reactions: No Arrival Time: 14:54 Had a fall or experienced change in No Accompanied By: daughter activities of daily living that may affect Transfer Assistance: None risk of falls: Patient Identification Verified: Yes Signs or symptoms of abuse/neglect since No Secondary Verification Process Completed: Yes last visito Patient Requires Transmission-Based Precautions: No Hospitalized since last visit: No Patient Has Alerts: Yes Implantable device outside of the clinic No Patient Alerts: Patient on Blood Thinner excluding cellular tissue based products placed in the center since last visit: Has Dressing in Place as Prescribed: Yes Has Footwear/Offloading in Place as Yes Prescribed: Right: Surgical Shoe with Pressure Relief Insole Pain Present Now: Yes Electronic Signature(s) Signed: 03/28/2023 4:14:05 PM By: [name] Stall RN, BSN Entered By: [name] on 03/28/2023 14:55:04 -------------------------------------------------------------------------------- Clinic Level of Care Assessment Details Patient Name: Date of Service: [name] 03/28/2023 2:30 PM Medical Record Number: [id number] Patient Account Number: [id number] Date of Birth/Sex: Treating RN: 29-Aug-1933 (89 y.o. [name] Primary Care [name]: [name] Other  Clinician: Referring [name]: Treating [name]/Extender: [name] in Treatment: 56 Clinic Level of Care Assessment Items TOOL 4 Quantity Score X- 1 0 Use when only an EandM is performed on FOLLOW-UP visit ASSESSMENTS - Nursing Assessment / Reassessment X- 1 10 Reassessment of Co-morbidities (includes updates in patient status) X- 1 5 Reassessment of Adherence to Treatment Plan ASSESSMENTS - Wound and Skin A ssessment / Reassessment X - Simple Wound Assessment / Reassessment - one wound 1 5 []  - 0 Complex Wound Assessment / Reassessment - multiple wounds []  - 0 Dermatologic / Skin Assessment (not related to wound area) [name] ([id number]) [id number] Page 2 of 10 ASSESSMENTS - Focused Assessment X- 1 5 Circumferential Edema Measurements - multi extremities []  - 0 Nutritional Assessment / Counseling / Intervention []  - 0 Lower Extremity Assessment (monofilament, tuning fork, pulses) []  - 0 Peripheral Arterial Disease Assessment (using hand held doppler) ASSESSMENTS - Ostomy and/or Continence Assessment and Care []  - 0 Incontinence Assessment and Management []  - 0 Ostomy Care Assessment and Management (repouching, etc.) PROCESS - Coordination of Care X - Simple Patient / Family Education for ongoing care 1 15 []  - 0 Complex (extensive) Patient / Family Education for ongoing care X- 1 10 Staff obtains Chiropractor, Records, T Results / Process Orders est []  - 0 Staff telephones HHA, Nursing Homes / Clarify orders / etc []  - 0 Routine Transfer to another Facility (non-emergent condition) []  - 0 Routine Hospital Admission (non-emergent condition) []  - 0 New Admissions / Manufacturing engineer / Ordering NPWT Apligraf, etc. , []  - 0 Emergency Hospital Admission (emergent condition) X- 1 10 Simple Discharge Coordination []  - 0 Complex (extensive) Discharge Coordination PROCESS - Special Needs []  -  0 Pediatric / Minor Patient Management []  - 0 Isolation Patient Management []  - 0 Hearing / Language / Visual special needs []  - 0 Assessment of Community assistance (transportation, D/C planning, etc.) []  - 0 Additional assistance / Altered mentation []  -  0 Support Surface(s) Assessment (bed, cushion, seat, etc.) INTERVENTIONS - Wound Cleansing / Measurement X - Simple Wound Cleansing - one wound 1 5 []  - 0 Complex Wound Cleansing - multiple wounds X- 1 5 Wound Imaging (photographs - any number of wounds) []  - 0 Wound Tracing (instead of photographs) X- 1 5 Simple Wound Measurement - one wound []  - 0 Complex Wound Measurement - multiple wounds INTERVENTIONS - Wound Dressings X - Small Wound Dressing one or multiple wounds 1 10 []  - 0 Medium Wound Dressing one or multiple wounds []  - 0 Large Wound Dressing one or multiple wounds []  - 0 Application of Medications - topical []  - 0 Application of Medications - injection INTERVENTIONS - Miscellaneous []  - 0 External ear exam []  - 0 Specimen Collection (cultures, biopsies, blood, body fluids, etc.) []  - 0 Specimen(s) / Culture(s) sent or taken to Lab for analysis []  - 0 Patient Transfer (multiple staff / Michiel Sites Lift / Similar devices) []  - 0 Simple Staple / Suture removal (25 or less) [name] ([id number]) [id number] Page 3 of 10 []  - 0 Complex Staple / Suture removal (26 or more) []  - 0 Hypo / Hyperglycemic Management (close monitor of Blood Glucose) []  - 0 Ankle / Brachial Index (ABI) - do not check if billed separately X- 1 5 Vital Signs Has the patient been seen at the hospital within the last three years: Yes Total Score: 90 Level Of Care: New/Established - Level 3 Electronic Signature(s) Signed: 03/28/2023 4:14:05 PM By: [name] Stall RN, BSN Entered By: [name] on 03/28/2023  15:14:55 -------------------------------------------------------------------------------- Encounter Discharge Information Details Patient Name: Date of Service: [name] 03/28/2023 2:30 PM Medical Record Number: [id number] Patient Account Number: [id number] Date of Birth/Sex: Treating RN: 03/30/1933 (89 y.o. [name] Primary Care [name]: [name] Other Clinician: Referring [name] Wenzlick: Treating Tanyah Debruyne/Extender: [name] in Treatment: 50 Encounter Discharge Information Items Discharge Condition: Stable Ambulatory Status: Cane Discharge Destination: Home Transportation: Private Auto Accompanied By: granddaughter Schedule Follow-up Appointment: Yes Clinical Summary of Care: Electronic Signature(s) Signed: 03/28/2023 4:14:05 PM By: [name] Stall RN, BSN Entered By: [name] on 03/28/2023 15:15:30 -------------------------------------------------------------------------------- Lower Extremity Assessment Details Patient Name: Date of Service: [name] 03/28/2023 2:30 PM Medical Record Number: [id number] Patient Account Number: [id number] Date of Birth/Sex: Treating RN: [birth year] (89 y.o. [name] Primary Care Adlyn Fife: [name] Other Clinician: Referring Geddy Boydstun: Treating Brandun Pinn/Extender: Charlyne Mom Weeks in Treatment: 38 Edema Assessment Assessed: [Left: No] [Right: Yes] Edema: [Left: N] [Right: o] Calf Left: Right: Point of Measurement: From Medial Instep 30 cm Ankle Left: Right: MATHIEU, Raiford ([id number]) [id number] Page 4 of 10 Point of Measurement: From Medial Instep 20 cm Vascular Assessment Pulses: Dorsalis Pedis Palpable: [Right:Yes] Extremity colors, hair growth, and conditions: Extremity Color: [Right:Normal] Hair Growth on Extremity: [Right:No] Temperature of Extremity: [Right:Warm] Capillary Refill: [Right:< 3  seconds] Dependent Rubor: [Right:No] Blanched when Elevated: [Right:No No] Toe Nail Assessment Left: Right: Thick: No Discolored: No Deformed: No Improper Length and Hygiene: No Electronic Signature(s) Signed: 03/28/2023 4:14:05 PM By: [name] Stall RN, BSN Entered By: [name] on 03/28/2023 14:57:18 -------------------------------------------------------------------------------- Multi Wound Chart Details Patient Name: Date of Service: [name] 03/28/2023 2:30 PM Medical Record Number: [id number] Patient Account Number: [id number] Date of Birth/Sex: Treating RN: 03-22-1933 (89 y.o. M) Primary Care Twinkle Sockwell: [name] Other Clinician: Referring Vidur Knust: Treating Detta Mellin/Extender: [name] in Treatment: 21 [4:Photos:] [N/A:N/A]  Right, Lateral Foot N/A N/A Wound Location: Shear/Friction N/A N/A Wounding Event: Diabetic Wound/Ulcer of the Lower N/A N/A Primary Etiology: Extremity Pressure Ulcer N/A N/A Secondary Etiology: Cataracts, Anemia, Asthma, Chronic N/A N/A Comorbid History: Obstructive Pulmonary Disease (COPD), Peripheral Arterial Disease, Type II Diabetes, Gout, Osteoarthritis, Neuropathy 11/21/2022 N/A N/A Date Acquired: 16 N/A N/A Weeks of Treatment: Open N/A N/A Wound Status: No N/A N/A Wound Recurrence: 2x1.3x0.3 N/A N/A Measurements L x W x D (cm) 2.042 N/A N/A A (cm) : rea 0.613 N/A N/A Volume (cm) : 60.60% N/A N/A % Reduction in A rea: -18.30% N/A N/A % Reduction in Volume: 5 Starting Position 1 (o'clock): 7 Ending Position 1 (o'clock): JAROSZ, Griffith ([id number]) [id number] Page 5 of 10 0.2 Maximum Distance 1 (cm): Yes N/A N/A Undermining: Grade 2 N/A N/A Classification: Medium N/A N/A Exudate A mount: Serosanguineous N/A N/A Exudate Type: red, brown N/A N/A Exudate Color: Distinct, outline attached N/A N/A Wound Margin: Medium (34-66%) N/A  N/A Granulation A mount: Red, Pink N/A N/A Granulation Quality: Medium (34-66%) N/A N/A Necrotic A mount: Fat Layer (Subcutaneous Tissue): Yes N/A N/A Exposed Structures: Bone: Yes Fascia: No Tendon: No Muscle: No Joint: No Medium (34-66%) N/A N/A Epithelialization: Callus: Yes N/A N/A Periwound Skin Texture: Excoriation: No Induration: No Crepitus: No Rash: No Scarring: No Maceration: Yes N/A N/A Periwound Skin Moisture: Dry/Scaly: No Rubor: Yes N/A N/A Periwound Skin Color: Atrophie Blanche: No Cyanosis: No Ecchymosis: No Erythema: No Hemosiderin Staining: No Mottled: No Pallor: No No Abnormality N/A N/A Temperature: Treatment Notes Wound #4 (Foot) Wound Laterality: Right, Lateral Cleanser Soap and Water Discharge Instruction: May shower and wash wound with dial antibacterial soap and water prior to dressing change. Vashe 5.8 (oz) Discharge Instruction: wet to dry with VASHE Wound Cleanser Discharge Instruction: Cleanse the wound with wound cleanser prior to applying a clean dressing using gauze sponges, not tissue or cotton balls. Peri-Wound Care Skin Prep Discharge Instruction: Use skin prep as directed Topical Primary Dressing wet to dry Vashe Secondary Dressing ABD Pad, 5x9 Discharge Instruction: Apply over primary dressing as directed. Secured With [employer], 4.5x3.1 (in/yd) Discharge Instruction: Secure with Kerlix as directed. 54M Medipore H Soft Cloth Surgical T ape, 4 x 10 (in/yd) Discharge Instruction: Secure with tape as directed. Compression Wrap Compression Stockings Add-Ons Electronic Signature(s) Signed: 03/28/2023 3:27:48 PM By: [name] DO Entered By: [name] on 03/28/2023 15:21:20 Red, Indy ([id number]) [id number] Page 6 of 10 -------------------------------------------------------------------------------- Multi-Disciplinary Care Plan Details Patient Name: Date of  Service: [name] 03/28/2023 2:30 PM Medical Record Number: [id number] Patient Account Number: [id number] Date of Birth/Sex: Treating RN: 12-14-1933 (89 y.o. [name] Primary Care Tandi Hanko: [name] Other Clinician: Referring Aaminah Forrester: Treating Elliemae Braman/Extender: [name] in Treatment: 48 Active Inactive Wound/Skin Impairment Nursing Diagnoses: Impaired tissue integrity Knowledge deficit related to ulceration/compromised skin integrity Goals: Patient will have a decrease in wound volume by X% from date: (specify in notes) Date Initiated: 07/04/2022 Target Resolution Date: 07/05/2023 Goal Status: Active Patient/caregiver will verbalize understanding of skin care regimen Date Initiated: 07/04/2022 Target Resolution Date: 07/05/2023 Goal Status: Active Ulcer/skin breakdown will have a volume reduction of 30% by week 4 Date Initiated: 07/04/2022 Date Inactivated: 10/27/2022 Target Resolution Date: 11/05/2022 Goal Status: Met Ulcer/skin breakdown will have a volume reduction of 50% by week 8 Date Initiated: 07/04/2022 Date Inactivated: 10/27/2022 Target Resolution Date: 11/05/2022 Goal Status: Met Interventions: Assess patient/caregiver ability to obtain necessary supplies Assess patient/caregiver ability to  perform ulcer/skin care regimen upon admission and as needed Assess ulceration(s) every visit Notes: Electronic Signature(s) Signed: 03/28/2023 4:14:05 PM By: [name] Stall RN, BSN Entered By: [name] on 03/28/2023 15:02:44 -------------------------------------------------------------------------------- Pain Assessment Details Patient Name: Date of Service: [name] 03/28/2023 2:30 PM Medical Record Number: [id number] Patient Account Number: [id number] Date of Birth/Sex: Treating RN: 20-Apr-1933 (89 y.o. [name] Primary Care Laquan Ludden: [name] Other Clinician: Referring Ranbir Chew: Treating  Raylene Carmickle/Extender: [name] in Treatment: 69 Active Problems Location of Pain Severity and Description of Pain Patient Has Paino Yes Site Locations Rate the pain. MCDAVID, Christia ([id number]) [id number] Page 7 of 10 Rate the pain. Current Pain Level: 8 Pain Management and Medication Current Pain Management: Medication: No Cold Application: No Rest: No Massage: No Activity: No T.E.N.S.: No Heat Application: No Leg drop or elevation: No Is the Current Pain Management Adequate: Adequate How does your wound impact your activities of daily livingo Sleep: No Bathing: No Appetite: No Relationship With Others: No Bladder Continence: No Emotions: No Bowel Continence: No Work: No Toileting: No Drive: No Dressing: No Hobbies: No Electronic Signature(s) Signed: 03/28/2023 4:14:05 PM By: [name] Stall RN, BSN Entered By: [name] on 03/28/2023 14:55:30 -------------------------------------------------------------------------------- Patient/Caregiver Education Details Patient Name: Date of Service: [name] 1/21/2025andnbsp2:30 PM Medical Record Number: [id number] Patient Account Number: [id number] Date of Birth/Gender: Treating RN: 13-Aug-1933 (89 y.o. [name] Primary Care Physician: [name] Other Clinician: Referring Physician: Treating Physician/Extender: [name] in Treatment: 55 Education Assessment Education Provided To: Patient Education Topics Provided Wound/Skin Impairment: Handouts: Caring for Your Ulcer Methods: Explain/Verbal Responses: Reinforcements needed Electronic Signature(s) Signed: 03/28/2023 4:14:05 PM By: [name] Stall RN, BSN Entered By: [name] on 03/28/2023 15:02:57 Savarese, Karman ([id number]) [id number] Page 8 of  10 -------------------------------------------------------------------------------- Wound Assessment Details Patient Name: Date of Service: [name] 03/28/2023 2:30 PM Medical Record Number: [id number] Patient Account Number: [id number] Date of Birth/Sex: Treating RN: [birth year] (89 y.o. [name] Primary Care Shantella Blubaugh: [name] Other Clinician: Referring Ayvin Lipinski: Treating Jalaila Caradonna/Extender: [name] in Treatment: 63 Wound Status Wound Number: 4 Primary Diabetic Wound/Ulcer of the Lower Extremity Etiology: Wound Location: Right, Lateral Foot Secondary Pressure Ulcer Wounding Event: Shear/Friction Etiology: Date Acquired: 11/21/2022 Wound Open Weeks Of Treatment: 16 Status: Clustered Wound: No Comorbid Cataracts, Anemia, Asthma, Chronic Obstructive Pulmonary History: Disease (COPD), Peripheral Arterial Disease, Type II Diabetes, Gout, Osteoarthritis, Neuropathy Photos Wound Measurements Length: (cm) 2 Width: (cm) 1.3 Depth: (cm) 0.3 Area: (cm) 2.042 Volume: (cm) 0.613 % Reduction in Area: 60.6% % Reduction in Volume: -18.3% Epithelialization: Medium (34-66%) Tunneling: No Undermining: Yes Starting Position (o'clock): 5 Ending Position (o'clock): 7 Maximum Distance: (cm) 0.2 Wound Description Classification: Grade 2 Wound Margin: Distinct, outline attached Exudate Amount: Medium Exudate Type: Serosanguineous Exudate Color: red, brown Foul Odor After Cleansing: No Slough/Fibrino Yes Wound Bed Granulation Amount: Medium (34-66%) Exposed Structure Granulation Quality: Red, Pink Fascia Exposed: No Necrotic Amount: Medium (34-66%) Fat Layer (Subcutaneous Tissue) Exposed: Yes Necrotic Quality: Adherent Slough Tendon Exposed: No Muscle Exposed: No Joint Exposed: No Bone Exposed: Yes Periwound Skin Texture Texture Color No Abnormalities Noted: No No Abnormalities Noted: No Callus: Yes Atrophie Blanche:  No Crepitus: No Cyanosis: No Excoriation: No Ecchymosis: No Induration: No Erythema: No Brosh, Kaamil ([id number]) [id number] Page 9 of 10 Rash: No Hemosiderin Staining: No Scarring: No Mottled: No Pallor: No Moisture Rubor: Yes No Abnormalities Noted: No Dry /  Scaly: No Temperature / Pain Maceration: Yes Temperature: No Abnormality Treatment Notes Wound #4 (Foot) Wound Laterality: Right, Lateral Cleanser Soap and Water Discharge Instruction: May shower and wash wound with dial antibacterial soap and water prior to dressing change. Vashe 5.8 (oz) Discharge Instruction: wet to dry with VASHE Wound Cleanser Discharge Instruction: Cleanse the wound with wound cleanser prior to applying a clean dressing using gauze sponges, not tissue or cotton balls. Peri-Wound Care Skin Prep Discharge Instruction: Use skin prep as directed Topical Primary Dressing wet to dry Vashe Secondary Dressing ABD Pad, 5x9 Discharge Instruction: Apply over primary dressing as directed. Secured With [employer], 4.5x3.1 (in/yd) Discharge Instruction: Secure with Kerlix as directed. 59M Medipore H Soft Cloth Surgical T ape, 4 x 10 (in/yd) Discharge Instruction: Secure with tape as directed. Compression Wrap Compression Stockings Add-Ons Electronic Signature(s) Signed: 03/28/2023 4:04:36 PM By: Thayer Dallas Signed: 03/28/2023 4:14:05 PM By: [name] Stall RN, BSN Entered By: Thayer Dallas on 03/28/2023 15:04:49 -------------------------------------------------------------------------------- Vitals Details Patient Name: Date of Service: [name] 03/28/2023 2:30 PM Medical Record Number: [id number] Patient Account Number: [id number] Date of Birth/Sex: Treating RN: May 30, 1933 (89 y.o. [name] Primary Care Adric Wrede: [name] Other Clinician: Referring Devontay Celaya: Treating Clyda Smyth/Extender: [name]  in Treatment: 49 Vital Signs Time Taken: 14:55 Reference Range: 80 - 120 mg / dl Height (in): 71 Weight (lbs): 170 Body Mass Index (BMI): 23.7 Notes Revelle, Ahman ([id number]) [id number].pdf Page 10 of 10 see post vital signs from [facility]. Electronic Signature(s) Signed: 03/28/2023 4:14:05 PM By: [name] Stall RN, BSN Entered By: [name] on 03/28/2023 14:55:21

## 2023-03-28 NOTE — Progress Notes (Signed)
VILLARI, Britton ([id number]) [id number] Page 1 of 1 Visit Report for 03/28/2023 SuperBill Details Patient Name: Date of Service: [name] [name] 03/28/2023 Medical Record Number: [id number] Patient Account Number: [id number] Date of Birth/Sex: Treating RN: [birth year] (89 y.o. [name], [name] Primary Care Provider: [name] Other Clinician: Haywood Pao Referring Provider: Treating Provider/Extender: Caffie Pinto in Treatment: 38 Diagnosis Coding ICD-10 Codes Code Description [phone] Non-pressure chronic ulcer of other part of right foot with necrosis of bone E11.621 Type 2 diabetes mellitus with foot ulcer I70.235 Atherosclerosis of native arteries of right leg with ulceration of other part of foot Facility Procedures CPT4 Code Description Modifier Quantity [id number]-(Facility Use Only) HBOT full body chamber, , 4 ICD-10 Diagnosis Description E11.621 Type 2 diabetes mellitus with foot ulcer L97.514 Non-pressure chronic ulcer of other part of right foot with necrosis of bone I70.235 Atherosclerosis of native arteries of right leg with ulceration of other part of foot Physician Procedures Quantity CPT4 Code Description Modifier [id number] - WC PHYS HYPERBARIC OXYGEN THERAPY 1 ICD-10 Diagnosis Description E11.621 Type 2 diabetes mellitus with foot ulcer L97.514 Non-pressure chronic ulcer of other part of right foot with necrosis of bone I70.235 Atherosclerosis of native arteries of right leg with ulceration of other part of foot Electronic Signature(s) Signed: 03/28/2023 3:00:48 PM By: Haywood Pao CHT EMT BS , , Signed: 03/28/2023 3:27:48 PM By: [name] DO Entered By: Haywood Pao on 03/28/2023 15:00:47

## 2023-03-28 NOTE — Progress Notes (Addendum)
BARRETTA, [name] ([id number]) [id number] Page 1 of 2 Visit Report for 03/28/2023 Arrival Information Details Patient Name: Date of Service: [name] [name] 03/28/2023 12:00 PM Medical Record Number: [id number] Patient Account Number: [id number] Date of Birth/Sex: Treating RN: 08-08-1933 (89 y.o. [name] Primary Care Kele Withem: [name] Other Clinician: Haywood Pao Referring Soledad Budreau: Treating Leisel Pinette/Extender: Caffie Pinto in Treatment: 61 Visit Information History Since Last Visit All ordered tests and consults were completed: Yes Patient Arrived: Cane Added or deleted any medications: No Arrival Time: 11:54 Any new allergies or adverse reactions: No Accompanied By: self Had a fall or experienced change in No Transfer Assistance: None activities of daily living that may affect Patient Identification Verified: Yes risk of falls: Secondary Verification Process Completed: Yes Signs or symptoms of abuse/neglect since last visito No Patient Requires Transmission-Based Precautions: No Hospitalized since last visit: No Patient Has Alerts: Yes Implantable device outside of the clinic excluding No Patient Alerts: Patient on Blood Thinner cellular tissue based products placed in the center since last visit: Pain Present Now: No Electronic Signature(s) Signed: 03/28/2023 2:22:21 PM By: Haywood Pao CHT EMT BS , , Entered By: Haywood Pao on 03/28/2023 14:22:21 -------------------------------------------------------------------------------- Encounter Discharge Information Details Patient Name: Date of Service: [name] [name] 03/28/2023 12:00 PM Medical Record Number: [id number] Patient Account Number: [id number] Date of Birth/Sex: Treating RN: [birth year] (89 y.o. [name] Primary Care Carin Shipp: [name] Other Clinician: Haywood Pao Referring [name]: Treating  Maryam Feely/Extender: Caffie Pinto in Treatment: 31 Encounter Discharge Information Items Discharge Condition: Stable Ambulatory Status: Ambulatory Discharge Destination: Home Transportation: Private Auto Accompanied By: daughter Schedule Follow-up Appointment: No Clinical Summary of Care: Electronic Signature(s) Signed: 03/28/2023 3:12:37 PM By: Haywood Pao CHT EMT BS , , Entered By: Haywood Pao on 03/28/2023 15:12:37 [name] ([id number]) [id number] Page 2 of 2 -------------------------------------------------------------------------------- Vitals Details Patient Name: Date of Service: [name] [name] 03/28/2023 12:00 PM Medical Record Number: [id number] Patient Account Number: [id number] Date of Birth/Sex: Treating RN: December 25, 1933 (89 y.o. [name] Primary Care Morrissa Shein: [name] Other Clinician: Haywood Pao Referring Shawntavia Saunders: Treating [name]/Extender: Caffie Pinto in Treatment: 54 Vital Signs Time Taken: 12:02 Temperature (F): 98.8 Height (in): 71 Pulse (bpm): 78 Weight (lbs): 170 Respiratory Rate (breaths/min): 20 Body Mass Index (BMI): 23.7 Blood Pressure (mmHg): 118/88 Capillary Blood Glucose (mg/dl): 220 Reference Range: 80 - 120 mg / dl Electronic Signature(s) Signed: 03/28/2023 2:22:51 PM By: Haywood Pao CHT EMT BS , , Entered By: Haywood Pao on 03/28/2023 14:22:51

## 2023-03-29 ENCOUNTER — Encounter (HOSPITAL_BASED_OUTPATIENT_CLINIC_OR_DEPARTMENT_OTHER): Payer: PPO | Admitting: Internal Medicine

## 2023-03-29 DIAGNOSIS — I70235 Atherosclerosis of native arteries of right leg with ulceration of other part of foot: Secondary | ICD-10-CM | POA: Diagnosis not present

## 2023-03-29 DIAGNOSIS — E11621 Type 2 diabetes mellitus with foot ulcer: Secondary | ICD-10-CM

## 2023-03-29 DIAGNOSIS — L97514 Non-pressure chronic ulcer of other part of right foot with necrosis of bone: Secondary | ICD-10-CM

## 2023-03-29 LAB — GLUCOSE, CAPILLARY
Glucose-Capillary: 191 mg/dL — ABNORMAL HIGH (ref 70–99)
Glucose-Capillary: 120 mg/dL — ABNORMAL HIGH (ref 70–99)

## 2023-03-30 ENCOUNTER — Encounter (HOSPITAL_BASED_OUTPATIENT_CLINIC_OR_DEPARTMENT_OTHER): Payer: PPO | Admitting: Internal Medicine

## 2023-03-30 DIAGNOSIS — I70235 Atherosclerosis of native arteries of right leg with ulceration of other part of foot: Secondary | ICD-10-CM

## 2023-03-30 DIAGNOSIS — E11621 Type 2 diabetes mellitus with foot ulcer: Secondary | ICD-10-CM | POA: Diagnosis not present

## 2023-03-30 DIAGNOSIS — L97514 Non-pressure chronic ulcer of other part of right foot with necrosis of bone: Secondary | ICD-10-CM | POA: Diagnosis not present

## 2023-03-30 LAB — GLUCOSE, CAPILLARY
Glucose-Capillary: 210 mg/dL — ABNORMAL HIGH (ref 70–99)
Glucose-Capillary: 133 mg/dL — ABNORMAL HIGH (ref 70–99)

## 2023-03-31 ENCOUNTER — Encounter (HOSPITAL_BASED_OUTPATIENT_CLINIC_OR_DEPARTMENT_OTHER): Payer: PPO | Admitting: Internal Medicine

## 2023-03-31 DIAGNOSIS — E11621 Type 2 diabetes mellitus with foot ulcer: Secondary | ICD-10-CM | POA: Diagnosis not present

## 2023-03-31 DIAGNOSIS — I70235 Atherosclerosis of native arteries of right leg with ulceration of other part of foot: Secondary | ICD-10-CM | POA: Diagnosis not present

## 2023-03-31 DIAGNOSIS — L97514 Non-pressure chronic ulcer of other part of right foot with necrosis of bone: Secondary | ICD-10-CM | POA: Diagnosis not present

## 2023-03-31 LAB — GLUCOSE, CAPILLARY
Glucose-Capillary: 188 mg/dL — ABNORMAL HIGH (ref 70–99)
Glucose-Capillary: 145 mg/dL — ABNORMAL HIGH (ref 70–99)

## 2023-04-03 ENCOUNTER — Encounter (HOSPITAL_BASED_OUTPATIENT_CLINIC_OR_DEPARTMENT_OTHER): Payer: PPO | Admitting: Internal Medicine

## 2023-04-03 DIAGNOSIS — L97514 Non-pressure chronic ulcer of other part of right foot with necrosis of bone: Secondary | ICD-10-CM | POA: Diagnosis not present

## 2023-04-03 DIAGNOSIS — I70235 Atherosclerosis of native arteries of right leg with ulceration of other part of foot: Secondary | ICD-10-CM | POA: Diagnosis not present

## 2023-04-03 DIAGNOSIS — E11621 Type 2 diabetes mellitus with foot ulcer: Secondary | ICD-10-CM | POA: Diagnosis not present

## 2023-04-03 LAB — GLUCOSE, CAPILLARY
Glucose-Capillary: 190 mg/dL — ABNORMAL HIGH (ref 70–99)
Glucose-Capillary: 138 mg/dL — ABNORMAL HIGH (ref 70–99)

## 2023-04-04 ENCOUNTER — Ambulatory Visit (HOSPITAL_BASED_OUTPATIENT_CLINIC_OR_DEPARTMENT_OTHER): Payer: PPO | Admitting: Internal Medicine

## 2023-04-04 ENCOUNTER — Encounter (HOSPITAL_BASED_OUTPATIENT_CLINIC_OR_DEPARTMENT_OTHER): Payer: PPO | Admitting: Internal Medicine

## 2023-04-05 ENCOUNTER — Encounter (HOSPITAL_BASED_OUTPATIENT_CLINIC_OR_DEPARTMENT_OTHER): Payer: PPO | Admitting: Internal Medicine

## 2023-04-05 DIAGNOSIS — M86371 Chronic multifocal osteomyelitis, right ankle and foot: Secondary | ICD-10-CM

## 2023-04-05 DIAGNOSIS — E11621 Type 2 diabetes mellitus with foot ulcer: Secondary | ICD-10-CM | POA: Diagnosis not present

## 2023-04-05 DIAGNOSIS — I70235 Atherosclerosis of native arteries of right leg with ulceration of other part of foot: Secondary | ICD-10-CM | POA: Diagnosis not present

## 2023-04-05 DIAGNOSIS — L97514 Non-pressure chronic ulcer of other part of right foot with necrosis of bone: Secondary | ICD-10-CM

## 2023-04-05 LAB — GLUCOSE, CAPILLARY
Glucose-Capillary: 174 mg/dL — ABNORMAL HIGH (ref 70–99)
Glucose-Capillary: 122 mg/dL — ABNORMAL HIGH (ref 70–99)

## 2023-04-06 ENCOUNTER — Encounter (HOSPITAL_BASED_OUTPATIENT_CLINIC_OR_DEPARTMENT_OTHER): Payer: PPO | Admitting: General Surgery

## 2023-04-06 DIAGNOSIS — E11621 Type 2 diabetes mellitus with foot ulcer: Secondary | ICD-10-CM | POA: Diagnosis not present

## 2023-04-06 LAB — GLUCOSE, CAPILLARY
Glucose-Capillary: 181 mg/dL — ABNORMAL HIGH (ref 70–99)
Glucose-Capillary: 135 mg/dL — ABNORMAL HIGH (ref 70–99)

## 2023-04-07 ENCOUNTER — Encounter (HOSPITAL_BASED_OUTPATIENT_CLINIC_OR_DEPARTMENT_OTHER): Payer: PPO | Admitting: General Surgery

## 2023-04-07 ENCOUNTER — Other Ambulatory Visit: Payer: Self-pay | Admitting: Student

## 2023-04-07 DIAGNOSIS — E11621 Type 2 diabetes mellitus with foot ulcer: Secondary | ICD-10-CM | POA: Diagnosis not present

## 2023-04-07 DIAGNOSIS — J449 Chronic obstructive pulmonary disease, unspecified: Secondary | ICD-10-CM

## 2023-04-07 LAB — GLUCOSE, CAPILLARY
Glucose-Capillary: 192 mg/dL — ABNORMAL HIGH (ref 70–99)
Glucose-Capillary: 209 mg/dL — ABNORMAL HIGH (ref 70–99)

## 2023-04-10 ENCOUNTER — Encounter (HOSPITAL_BASED_OUTPATIENT_CLINIC_OR_DEPARTMENT_OTHER): Payer: PPO | Attending: Internal Medicine | Admitting: Internal Medicine

## 2023-04-10 DIAGNOSIS — Z89411 Acquired absence of right great toe: Secondary | ICD-10-CM | POA: Diagnosis not present

## 2023-04-10 DIAGNOSIS — E1122 Type 2 diabetes mellitus with diabetic chronic kidney disease: Secondary | ICD-10-CM | POA: Diagnosis not present

## 2023-04-10 DIAGNOSIS — E1165 Type 2 diabetes mellitus with hyperglycemia: Secondary | ICD-10-CM | POA: Diagnosis not present

## 2023-04-10 DIAGNOSIS — Z89421 Acquired absence of other right toe(s): Secondary | ICD-10-CM | POA: Insufficient documentation

## 2023-04-10 DIAGNOSIS — N183 Chronic kidney disease, stage 3 unspecified: Secondary | ICD-10-CM | POA: Insufficient documentation

## 2023-04-10 DIAGNOSIS — E1169 Type 2 diabetes mellitus with other specified complication: Secondary | ICD-10-CM | POA: Insufficient documentation

## 2023-04-10 DIAGNOSIS — L97514 Non-pressure chronic ulcer of other part of right foot with necrosis of bone: Secondary | ICD-10-CM | POA: Diagnosis not present

## 2023-04-10 DIAGNOSIS — I70235 Atherosclerosis of native arteries of right leg with ulceration of other part of foot: Secondary | ICD-10-CM | POA: Diagnosis not present

## 2023-04-10 DIAGNOSIS — M86371 Chronic multifocal osteomyelitis, right ankle and foot: Secondary | ICD-10-CM | POA: Diagnosis not present

## 2023-04-10 DIAGNOSIS — Z794 Long term (current) use of insulin: Secondary | ICD-10-CM | POA: Insufficient documentation

## 2023-04-10 DIAGNOSIS — J449 Chronic obstructive pulmonary disease, unspecified: Secondary | ICD-10-CM | POA: Insufficient documentation

## 2023-04-10 DIAGNOSIS — E11621 Type 2 diabetes mellitus with foot ulcer: Secondary | ICD-10-CM | POA: Diagnosis not present

## 2023-04-10 LAB — GLUCOSE, CAPILLARY
Glucose-Capillary: 141 mg/dL — ABNORMAL HIGH (ref 70–99)
Glucose-Capillary: 122 mg/dL — ABNORMAL HIGH (ref 70–99)

## 2023-04-11 ENCOUNTER — Encounter (HOSPITAL_BASED_OUTPATIENT_CLINIC_OR_DEPARTMENT_OTHER): Payer: PPO | Admitting: Internal Medicine

## 2023-04-11 DIAGNOSIS — I70235 Atherosclerosis of native arteries of right leg with ulceration of other part of foot: Secondary | ICD-10-CM

## 2023-04-11 DIAGNOSIS — M86371 Chronic multifocal osteomyelitis, right ankle and foot: Secondary | ICD-10-CM

## 2023-04-11 DIAGNOSIS — E1169 Type 2 diabetes mellitus with other specified complication: Secondary | ICD-10-CM | POA: Diagnosis not present

## 2023-04-11 DIAGNOSIS — E11621 Type 2 diabetes mellitus with foot ulcer: Secondary | ICD-10-CM | POA: Diagnosis not present

## 2023-04-11 DIAGNOSIS — L97514 Non-pressure chronic ulcer of other part of right foot with necrosis of bone: Secondary | ICD-10-CM

## 2023-04-11 LAB — GLUCOSE, CAPILLARY
Glucose-Capillary: 183 mg/dL — ABNORMAL HIGH (ref 70–99)
Glucose-Capillary: 239 mg/dL — ABNORMAL HIGH (ref 70–99)

## 2023-04-12 ENCOUNTER — Encounter (HOSPITAL_BASED_OUTPATIENT_CLINIC_OR_DEPARTMENT_OTHER): Payer: PPO | Admitting: Internal Medicine

## 2023-04-12 DIAGNOSIS — I70235 Atherosclerosis of native arteries of right leg with ulceration of other part of foot: Secondary | ICD-10-CM | POA: Diagnosis not present

## 2023-04-12 DIAGNOSIS — E11621 Type 2 diabetes mellitus with foot ulcer: Secondary | ICD-10-CM | POA: Diagnosis not present

## 2023-04-12 DIAGNOSIS — L97514 Non-pressure chronic ulcer of other part of right foot with necrosis of bone: Secondary | ICD-10-CM

## 2023-04-12 DIAGNOSIS — E1169 Type 2 diabetes mellitus with other specified complication: Secondary | ICD-10-CM | POA: Diagnosis not present

## 2023-04-12 DIAGNOSIS — M86371 Chronic multifocal osteomyelitis, right ankle and foot: Secondary | ICD-10-CM

## 2023-04-12 LAB — GLUCOSE, CAPILLARY
Glucose-Capillary: 159 mg/dL — ABNORMAL HIGH (ref 70–99)
Glucose-Capillary: 151 mg/dL — ABNORMAL HIGH (ref 70–99)

## 2023-04-12 NOTE — Progress Notes (Signed)
  SUBJECTIVE:   CHIEF COMPLAINT / HPI:   DM2 Meds: Jardiance  25 mg Lab Results  Component Value Date   HGBA1C 6.3 04/13/2023   Taking meds regularly, no complaints.  PERTINENT  PMH / PSH:    OBJECTIVE:  BP 131/78   Pulse (!) 105   Ht 5' 10 (1.778 m)   Wt 171 lb (77.6 kg)   SpO2 99%   BMI 24.54 kg/m  Physical Exam Constitutional:      General: He is not in acute distress.    Appearance: Normal appearance. He is not ill-appearing.  Cardiovascular:     Rate and Rhythm: Normal rate and regular rhythm.     Pulses: Normal pulses.     Heart sounds: Normal heart sounds. No murmur heard.    No friction rub. No gallop.  Pulmonary:     Effort: Pulmonary effort is normal. No respiratory distress.     Breath sounds: Normal breath sounds. No stridor. No wheezing, rhonchi or rales.  Neurological:     Mental Status: He is alert.      ASSESSMENT/PLAN:   Assessment & Plan Type 2 diabetes mellitus with peripheral neuropathy [facility]) Patient comes in for follow-up of his diabetes.  Patient reports good compliance with medications, no issues.  Patient's A1c 6.3 today, below goal of 8.  Will continue current medication plans as patient not on any medication that would lower blood sugars. - Continue Jardiance  25 mg daily - Follow-up 6 months No follow-ups on file. Penne Rhein, MD 04/13/2023, 12:03 PM PGY-3, [facility]

## 2023-04-12 NOTE — Patient Instructions (Signed)
 It was great to see you! Thank you for allowing me to participate in your care!  I recommend that you always bring your medications to each appointment as this makes it easy to ensure we are on the correct medications and helps us  not miss when refills are needed.  Our plans for today:  - Diabetes  Continue Jardiance  25 mg daily  Take care and seek immediate care sooner if you develop any concerns.   Dr. Penne Rhein, MD [facility]

## 2023-04-13 ENCOUNTER — Ambulatory Visit: Payer: PPO | Admitting: Student

## 2023-04-13 ENCOUNTER — Encounter (HOSPITAL_BASED_OUTPATIENT_CLINIC_OR_DEPARTMENT_OTHER): Payer: PPO | Admitting: Internal Medicine

## 2023-04-13 ENCOUNTER — Encounter: Payer: Self-pay | Admitting: Student

## 2023-04-13 VITALS — BP 131/78 | HR 105 | Ht 70.0 in | Wt 171.0 lb

## 2023-04-13 DIAGNOSIS — E11621 Type 2 diabetes mellitus with foot ulcer: Secondary | ICD-10-CM | POA: Diagnosis not present

## 2023-04-13 DIAGNOSIS — L97514 Non-pressure chronic ulcer of other part of right foot with necrosis of bone: Secondary | ICD-10-CM

## 2023-04-13 DIAGNOSIS — M86371 Chronic multifocal osteomyelitis, right ankle and foot: Secondary | ICD-10-CM

## 2023-04-13 DIAGNOSIS — E1142 Type 2 diabetes mellitus with diabetic polyneuropathy: Secondary | ICD-10-CM | POA: Diagnosis not present

## 2023-04-13 DIAGNOSIS — I70235 Atherosclerosis of native arteries of right leg with ulceration of other part of foot: Secondary | ICD-10-CM

## 2023-04-13 DIAGNOSIS — E1169 Type 2 diabetes mellitus with other specified complication: Secondary | ICD-10-CM | POA: Diagnosis not present

## 2023-04-13 LAB — POCT GLYCOSYLATED HEMOGLOBIN (HGB A1C): HbA1c, POC (prediabetic range): 6.3 % (ref 5.7–6.4)

## 2023-04-13 LAB — GLUCOSE, CAPILLARY
Glucose-Capillary: 172 mg/dL — ABNORMAL HIGH (ref 70–99)
Glucose-Capillary: 119 mg/dL — ABNORMAL HIGH (ref 70–99)

## 2023-04-13 NOTE — Assessment & Plan Note (Addendum)
 Patient comes in for follow-up of his diabetes.  Patient reports good compliance with medications, no issues.  Patient's A1c 6.3 today, below goal of 8.  Will continue current medication plans as patient not on any medication that would lower blood sugars. - Continue Jardiance  25 mg daily - Follow-up 6 months

## 2023-04-14 ENCOUNTER — Other Ambulatory Visit: Payer: Self-pay | Admitting: *Deleted

## 2023-04-14 ENCOUNTER — Encounter (HOSPITAL_BASED_OUTPATIENT_CLINIC_OR_DEPARTMENT_OTHER): Payer: PPO | Admitting: Internal Medicine

## 2023-04-14 DIAGNOSIS — L97514 Non-pressure chronic ulcer of other part of right foot with necrosis of bone: Secondary | ICD-10-CM | POA: Diagnosis not present

## 2023-04-14 DIAGNOSIS — Z794 Long term (current) use of insulin: Secondary | ICD-10-CM

## 2023-04-14 DIAGNOSIS — E1169 Type 2 diabetes mellitus with other specified complication: Secondary | ICD-10-CM | POA: Diagnosis not present

## 2023-04-14 DIAGNOSIS — M86371 Chronic multifocal osteomyelitis, right ankle and foot: Secondary | ICD-10-CM | POA: Diagnosis not present

## 2023-04-14 DIAGNOSIS — I70235 Atherosclerosis of native arteries of right leg with ulceration of other part of foot: Secondary | ICD-10-CM

## 2023-04-14 DIAGNOSIS — E11621 Type 2 diabetes mellitus with foot ulcer: Secondary | ICD-10-CM | POA: Diagnosis not present

## 2023-04-14 LAB — GLUCOSE, CAPILLARY: Glucose-Capillary: 166 mg/dL — ABNORMAL HIGH (ref 70–99)

## 2023-04-15 MED ORDER — GABAPENTIN 300 MG PO CAPS: ORAL_CAPSULE | ORAL | 1 refills | Status: DC

## 2023-04-17 ENCOUNTER — Encounter (HOSPITAL_BASED_OUTPATIENT_CLINIC_OR_DEPARTMENT_OTHER): Payer: PPO | Admitting: Internal Medicine

## 2023-04-17 DIAGNOSIS — I70235 Atherosclerosis of native arteries of right leg with ulceration of other part of foot: Secondary | ICD-10-CM | POA: Diagnosis not present

## 2023-04-17 DIAGNOSIS — E11621 Type 2 diabetes mellitus with foot ulcer: Secondary | ICD-10-CM

## 2023-04-17 DIAGNOSIS — L97514 Non-pressure chronic ulcer of other part of right foot with necrosis of bone: Secondary | ICD-10-CM

## 2023-04-17 DIAGNOSIS — M86371 Chronic multifocal osteomyelitis, right ankle and foot: Secondary | ICD-10-CM

## 2023-04-17 DIAGNOSIS — E1169 Type 2 diabetes mellitus with other specified complication: Secondary | ICD-10-CM | POA: Diagnosis not present

## 2023-04-17 LAB — GLUCOSE, CAPILLARY: Glucose-Capillary: 203 mg/dL — ABNORMAL HIGH (ref 70–99)

## 2023-04-18 ENCOUNTER — Encounter (HOSPITAL_BASED_OUTPATIENT_CLINIC_OR_DEPARTMENT_OTHER): Payer: PPO | Admitting: Internal Medicine

## 2023-04-18 DIAGNOSIS — M86371 Chronic multifocal osteomyelitis, right ankle and foot: Secondary | ICD-10-CM

## 2023-04-18 DIAGNOSIS — I70235 Atherosclerosis of native arteries of right leg with ulceration of other part of foot: Secondary | ICD-10-CM

## 2023-04-18 DIAGNOSIS — L97514 Non-pressure chronic ulcer of other part of right foot with necrosis of bone: Secondary | ICD-10-CM | POA: Diagnosis not present

## 2023-04-18 DIAGNOSIS — E11621 Type 2 diabetes mellitus with foot ulcer: Secondary | ICD-10-CM

## 2023-04-18 DIAGNOSIS — E1169 Type 2 diabetes mellitus with other specified complication: Secondary | ICD-10-CM | POA: Diagnosis not present

## 2023-04-18 LAB — GLUCOSE, CAPILLARY
Glucose-Capillary: 176 mg/dL — ABNORMAL HIGH (ref 70–99)
Glucose-Capillary: 174 mg/dL — ABNORMAL HIGH (ref 70–99)

## 2023-04-19 ENCOUNTER — Encounter (HOSPITAL_BASED_OUTPATIENT_CLINIC_OR_DEPARTMENT_OTHER): Payer: PPO | Admitting: Internal Medicine

## 2023-04-19 DIAGNOSIS — L97514 Non-pressure chronic ulcer of other part of right foot with necrosis of bone: Secondary | ICD-10-CM

## 2023-04-19 DIAGNOSIS — I70235 Atherosclerosis of native arteries of right leg with ulceration of other part of foot: Secondary | ICD-10-CM

## 2023-04-19 DIAGNOSIS — M86371 Chronic multifocal osteomyelitis, right ankle and foot: Secondary | ICD-10-CM | POA: Diagnosis not present

## 2023-04-19 DIAGNOSIS — E11621 Type 2 diabetes mellitus with foot ulcer: Secondary | ICD-10-CM | POA: Diagnosis not present

## 2023-04-19 DIAGNOSIS — E1169 Type 2 diabetes mellitus with other specified complication: Secondary | ICD-10-CM | POA: Diagnosis not present

## 2023-04-19 LAB — GLUCOSE, CAPILLARY
Glucose-Capillary: 155 mg/dL — ABNORMAL HIGH (ref 70–99)
Glucose-Capillary: 153 mg/dL — ABNORMAL HIGH (ref 70–99)

## 2023-04-20 ENCOUNTER — Encounter (HOSPITAL_BASED_OUTPATIENT_CLINIC_OR_DEPARTMENT_OTHER): Payer: PPO | Admitting: Internal Medicine

## 2023-04-20 DIAGNOSIS — L97514 Non-pressure chronic ulcer of other part of right foot with necrosis of bone: Secondary | ICD-10-CM

## 2023-04-20 DIAGNOSIS — E11621 Type 2 diabetes mellitus with foot ulcer: Secondary | ICD-10-CM | POA: Diagnosis not present

## 2023-04-20 DIAGNOSIS — M86371 Chronic multifocal osteomyelitis, right ankle and foot: Secondary | ICD-10-CM | POA: Diagnosis not present

## 2023-04-20 DIAGNOSIS — I70235 Atherosclerosis of native arteries of right leg with ulceration of other part of foot: Secondary | ICD-10-CM | POA: Diagnosis not present

## 2023-04-20 DIAGNOSIS — E1169 Type 2 diabetes mellitus with other specified complication: Secondary | ICD-10-CM | POA: Diagnosis not present

## 2023-04-20 LAB — GLUCOSE, CAPILLARY
Glucose-Capillary: 173 mg/dL — ABNORMAL HIGH (ref 70–99)
Glucose-Capillary: 170 mg/dL — ABNORMAL HIGH (ref 70–99)

## 2023-04-21 ENCOUNTER — Encounter (HOSPITAL_BASED_OUTPATIENT_CLINIC_OR_DEPARTMENT_OTHER): Payer: PPO | Admitting: Internal Medicine

## 2023-04-21 DIAGNOSIS — I70235 Atherosclerosis of native arteries of right leg with ulceration of other part of foot: Secondary | ICD-10-CM | POA: Diagnosis not present

## 2023-04-21 DIAGNOSIS — E11621 Type 2 diabetes mellitus with foot ulcer: Secondary | ICD-10-CM

## 2023-04-21 DIAGNOSIS — M86371 Chronic multifocal osteomyelitis, right ankle and foot: Secondary | ICD-10-CM

## 2023-04-21 DIAGNOSIS — L97514 Non-pressure chronic ulcer of other part of right foot with necrosis of bone: Secondary | ICD-10-CM

## 2023-04-21 DIAGNOSIS — E1169 Type 2 diabetes mellitus with other specified complication: Secondary | ICD-10-CM | POA: Diagnosis not present

## 2023-04-21 LAB — GLUCOSE, CAPILLARY
Glucose-Capillary: 220 mg/dL — ABNORMAL HIGH (ref 70–99)
Glucose-Capillary: 135 mg/dL — ABNORMAL HIGH (ref 70–99)

## 2023-04-24 ENCOUNTER — Encounter (HOSPITAL_BASED_OUTPATIENT_CLINIC_OR_DEPARTMENT_OTHER): Payer: PPO | Admitting: Internal Medicine

## 2023-04-24 DIAGNOSIS — I70235 Atherosclerosis of native arteries of right leg with ulceration of other part of foot: Secondary | ICD-10-CM | POA: Diagnosis not present

## 2023-04-24 DIAGNOSIS — E11621 Type 2 diabetes mellitus with foot ulcer: Secondary | ICD-10-CM | POA: Diagnosis not present

## 2023-04-24 DIAGNOSIS — L97514 Non-pressure chronic ulcer of other part of right foot with necrosis of bone: Secondary | ICD-10-CM | POA: Diagnosis not present

## 2023-04-24 DIAGNOSIS — M86371 Chronic multifocal osteomyelitis, right ankle and foot: Secondary | ICD-10-CM | POA: Diagnosis not present

## 2023-04-24 DIAGNOSIS — E1169 Type 2 diabetes mellitus with other specified complication: Secondary | ICD-10-CM | POA: Diagnosis not present

## 2023-04-24 LAB — GLUCOSE, CAPILLARY
Glucose-Capillary: 180 mg/dL — ABNORMAL HIGH (ref 70–99)
Glucose-Capillary: 175 mg/dL — ABNORMAL HIGH (ref 70–99)

## 2023-04-25 ENCOUNTER — Encounter (HOSPITAL_BASED_OUTPATIENT_CLINIC_OR_DEPARTMENT_OTHER): Payer: PPO | Admitting: Internal Medicine

## 2023-04-25 DIAGNOSIS — M86371 Chronic multifocal osteomyelitis, right ankle and foot: Secondary | ICD-10-CM | POA: Diagnosis not present

## 2023-04-25 DIAGNOSIS — E11621 Type 2 diabetes mellitus with foot ulcer: Secondary | ICD-10-CM | POA: Diagnosis not present

## 2023-04-25 DIAGNOSIS — E1169 Type 2 diabetes mellitus with other specified complication: Secondary | ICD-10-CM | POA: Diagnosis not present

## 2023-04-25 DIAGNOSIS — L97514 Non-pressure chronic ulcer of other part of right foot with necrosis of bone: Secondary | ICD-10-CM

## 2023-04-25 DIAGNOSIS — I70235 Atherosclerosis of native arteries of right leg with ulceration of other part of foot: Secondary | ICD-10-CM

## 2023-04-25 LAB — GLUCOSE, CAPILLARY
Glucose-Capillary: 217 mg/dL — ABNORMAL HIGH (ref 70–99)
Glucose-Capillary: 155 mg/dL — ABNORMAL HIGH (ref 70–99)

## 2023-04-26 ENCOUNTER — Encounter (HOSPITAL_BASED_OUTPATIENT_CLINIC_OR_DEPARTMENT_OTHER): Payer: PPO | Admitting: Internal Medicine

## 2023-04-26 DIAGNOSIS — L97514 Non-pressure chronic ulcer of other part of right foot with necrosis of bone: Secondary | ICD-10-CM

## 2023-04-26 DIAGNOSIS — M86371 Chronic multifocal osteomyelitis, right ankle and foot: Secondary | ICD-10-CM | POA: Diagnosis not present

## 2023-04-26 DIAGNOSIS — I70235 Atherosclerosis of native arteries of right leg with ulceration of other part of foot: Secondary | ICD-10-CM

## 2023-04-26 DIAGNOSIS — E1169 Type 2 diabetes mellitus with other specified complication: Secondary | ICD-10-CM | POA: Diagnosis not present

## 2023-04-26 DIAGNOSIS — E11621 Type 2 diabetes mellitus with foot ulcer: Secondary | ICD-10-CM

## 2023-04-26 LAB — GLUCOSE, CAPILLARY
Glucose-Capillary: 211 mg/dL — ABNORMAL HIGH (ref 70–99)
Glucose-Capillary: 151 mg/dL — ABNORMAL HIGH (ref 70–99)

## 2023-04-27 ENCOUNTER — Encounter (HOSPITAL_BASED_OUTPATIENT_CLINIC_OR_DEPARTMENT_OTHER): Payer: PPO | Admitting: Internal Medicine

## 2023-04-27 DIAGNOSIS — E1169 Type 2 diabetes mellitus with other specified complication: Secondary | ICD-10-CM | POA: Diagnosis not present

## 2023-04-27 DIAGNOSIS — E11621 Type 2 diabetes mellitus with foot ulcer: Secondary | ICD-10-CM | POA: Diagnosis not present

## 2023-04-27 DIAGNOSIS — L97514 Non-pressure chronic ulcer of other part of right foot with necrosis of bone: Secondary | ICD-10-CM

## 2023-04-27 DIAGNOSIS — M86371 Chronic multifocal osteomyelitis, right ankle and foot: Secondary | ICD-10-CM

## 2023-04-27 DIAGNOSIS — I70235 Atherosclerosis of native arteries of right leg with ulceration of other part of foot: Secondary | ICD-10-CM

## 2023-04-27 LAB — GLUCOSE, CAPILLARY
Glucose-Capillary: 223 mg/dL — ABNORMAL HIGH (ref 70–99)
Glucose-Capillary: 188 mg/dL — ABNORMAL HIGH (ref 70–99)

## 2023-04-28 ENCOUNTER — Encounter (HOSPITAL_BASED_OUTPATIENT_CLINIC_OR_DEPARTMENT_OTHER): Payer: PPO | Admitting: Internal Medicine

## 2023-04-28 DIAGNOSIS — E1169 Type 2 diabetes mellitus with other specified complication: Secondary | ICD-10-CM | POA: Diagnosis not present

## 2023-04-28 DIAGNOSIS — M86371 Chronic multifocal osteomyelitis, right ankle and foot: Secondary | ICD-10-CM | POA: Diagnosis not present

## 2023-04-28 DIAGNOSIS — I70235 Atherosclerosis of native arteries of right leg with ulceration of other part of foot: Secondary | ICD-10-CM

## 2023-04-28 DIAGNOSIS — L97514 Non-pressure chronic ulcer of other part of right foot with necrosis of bone: Secondary | ICD-10-CM | POA: Diagnosis not present

## 2023-04-28 DIAGNOSIS — E11621 Type 2 diabetes mellitus with foot ulcer: Secondary | ICD-10-CM

## 2023-04-28 LAB — GLUCOSE, CAPILLARY
Glucose-Capillary: 211 mg/dL — ABNORMAL HIGH (ref 70–99)
Glucose-Capillary: 206 mg/dL — ABNORMAL HIGH (ref 70–99)

## 2023-05-01 ENCOUNTER — Encounter (HOSPITAL_BASED_OUTPATIENT_CLINIC_OR_DEPARTMENT_OTHER): Payer: PPO | Admitting: Internal Medicine

## 2023-05-01 DIAGNOSIS — L97514 Non-pressure chronic ulcer of other part of right foot with necrosis of bone: Secondary | ICD-10-CM | POA: Diagnosis not present

## 2023-05-01 DIAGNOSIS — I70235 Atherosclerosis of native arteries of right leg with ulceration of other part of foot: Secondary | ICD-10-CM

## 2023-05-01 DIAGNOSIS — E1169 Type 2 diabetes mellitus with other specified complication: Secondary | ICD-10-CM | POA: Diagnosis not present

## 2023-05-01 DIAGNOSIS — M86371 Chronic multifocal osteomyelitis, right ankle and foot: Secondary | ICD-10-CM

## 2023-05-01 DIAGNOSIS — E11621 Type 2 diabetes mellitus with foot ulcer: Secondary | ICD-10-CM

## 2023-05-02 ENCOUNTER — Encounter (HOSPITAL_BASED_OUTPATIENT_CLINIC_OR_DEPARTMENT_OTHER): Payer: PPO | Admitting: Internal Medicine

## 2023-05-02 DIAGNOSIS — E11621 Type 2 diabetes mellitus with foot ulcer: Secondary | ICD-10-CM

## 2023-05-02 DIAGNOSIS — I70235 Atherosclerosis of native arteries of right leg with ulceration of other part of foot: Secondary | ICD-10-CM | POA: Diagnosis not present

## 2023-05-02 DIAGNOSIS — M86371 Chronic multifocal osteomyelitis, right ankle and foot: Secondary | ICD-10-CM

## 2023-05-02 DIAGNOSIS — L97514 Non-pressure chronic ulcer of other part of right foot with necrosis of bone: Secondary | ICD-10-CM

## 2023-05-02 DIAGNOSIS — E1169 Type 2 diabetes mellitus with other specified complication: Secondary | ICD-10-CM | POA: Diagnosis not present

## 2023-05-02 LAB — GLUCOSE, CAPILLARY
Glucose-Capillary: 196 mg/dL — ABNORMAL HIGH (ref 70–99)
Glucose-Capillary: 168 mg/dL — ABNORMAL HIGH (ref 70–99)
Glucose-Capillary: 208 mg/dL — ABNORMAL HIGH (ref 70–99)
Glucose-Capillary: 177 mg/dL — ABNORMAL HIGH (ref 70–99)

## 2023-05-03 ENCOUNTER — Encounter (HOSPITAL_BASED_OUTPATIENT_CLINIC_OR_DEPARTMENT_OTHER): Payer: PPO | Admitting: Internal Medicine

## 2023-05-03 ENCOUNTER — Encounter: Payer: Self-pay | Admitting: Podiatry

## 2023-05-03 ENCOUNTER — Ambulatory Visit (INDEPENDENT_AMBULATORY_CARE_PROVIDER_SITE_OTHER): Payer: PPO | Admitting: Podiatry

## 2023-05-03 DIAGNOSIS — E1151 Type 2 diabetes mellitus with diabetic peripheral angiopathy without gangrene: Secondary | ICD-10-CM

## 2023-05-03 DIAGNOSIS — B351 Tinea unguium: Secondary | ICD-10-CM

## 2023-05-03 DIAGNOSIS — M79674 Pain in right toe(s): Secondary | ICD-10-CM

## 2023-05-03 DIAGNOSIS — M86371 Chronic multifocal osteomyelitis, right ankle and foot: Secondary | ICD-10-CM | POA: Diagnosis not present

## 2023-05-03 DIAGNOSIS — E11621 Type 2 diabetes mellitus with foot ulcer: Secondary | ICD-10-CM | POA: Diagnosis not present

## 2023-05-03 DIAGNOSIS — I70235 Atherosclerosis of native arteries of right leg with ulceration of other part of foot: Secondary | ICD-10-CM | POA: Diagnosis not present

## 2023-05-03 DIAGNOSIS — L97514 Non-pressure chronic ulcer of other part of right foot with necrosis of bone: Secondary | ICD-10-CM | POA: Diagnosis not present

## 2023-05-03 DIAGNOSIS — E1169 Type 2 diabetes mellitus with other specified complication: Secondary | ICD-10-CM | POA: Diagnosis not present

## 2023-05-03 DIAGNOSIS — L97512 Non-pressure chronic ulcer of other part of right foot with fat layer exposed: Secondary | ICD-10-CM

## 2023-05-03 DIAGNOSIS — M79675 Pain in left toe(s): Secondary | ICD-10-CM

## 2023-05-03 LAB — GLUCOSE, CAPILLARY
Glucose-Capillary: 176 mg/dL — ABNORMAL HIGH (ref 70–99)
Glucose-Capillary: 133 mg/dL — ABNORMAL HIGH (ref 70–99)

## 2023-05-03 NOTE — Progress Notes (Signed)
 This patient returns to my office for at risk foot care.  This patient requires this care by a professional since this patient will be at risk due to having diabetes and amputation 1,2 ,3 right toes.  This patient is unable to cut nails himself since the patient cannot reach his nails.These nails are painful walking and wearing shoes.  This patient presents for at risk foot care today.  General Appearance  Alert, conversant and in no acute stress.  Vascular  Dorsalis pedis and posterior tibial  pulses are  weakly palpable  bilaterally.  Capillary return is within normal limits  bilaterally. Temperature is within normal limits  bilaterally.  Neurologic  Senn-Weinstein monofilament wire test within normal limits  bilaterally. Muscle power within normal limits bilaterally.  Nails Thick disfigured discolored nails with subungual debris  from hallux to fifth toes left and 4-5 right foot. No evidence of bacterial infection or drainage bilaterally.  Orthopedic  No limitations of motion  feet .  No crepitus or effusions noted.  No bony pathology or digital deformities noted.  Amputation 1,2 , 3 right toes.  Skin  normotropic skin with no porokeratosis noted bilaterally.  No signs of infections or ulcers noted.     Onychomycosis  Pain in right toes  Pain in left toes  Consent was obtained for treatment procedures.   Mechanical debridement of nails 1-5  bilaterally performed with a nail nipper.  Filed with dremel without incident.    Return office visit    3 months                  Told patient to return for periodic foot care and evaluation due to potential at risk complications.   Helane Gunther DPM

## 2023-05-04 ENCOUNTER — Encounter (HOSPITAL_BASED_OUTPATIENT_CLINIC_OR_DEPARTMENT_OTHER): Payer: PPO | Admitting: Internal Medicine

## 2023-05-04 DIAGNOSIS — E11621 Type 2 diabetes mellitus with foot ulcer: Secondary | ICD-10-CM

## 2023-05-04 DIAGNOSIS — L97514 Non-pressure chronic ulcer of other part of right foot with necrosis of bone: Secondary | ICD-10-CM

## 2023-05-04 DIAGNOSIS — E1169 Type 2 diabetes mellitus with other specified complication: Secondary | ICD-10-CM | POA: Diagnosis not present

## 2023-05-04 DIAGNOSIS — M86371 Chronic multifocal osteomyelitis, right ankle and foot: Secondary | ICD-10-CM | POA: Diagnosis not present

## 2023-05-04 LAB — GLUCOSE, CAPILLARY
Glucose-Capillary: 170 mg/dL — ABNORMAL HIGH (ref 70–99)
Glucose-Capillary: 141 mg/dL — ABNORMAL HIGH (ref 70–99)

## 2023-05-05 ENCOUNTER — Encounter (HOSPITAL_BASED_OUTPATIENT_CLINIC_OR_DEPARTMENT_OTHER): Payer: PPO | Admitting: Internal Medicine

## 2023-05-05 DIAGNOSIS — M86371 Chronic multifocal osteomyelitis, right ankle and foot: Secondary | ICD-10-CM

## 2023-05-05 DIAGNOSIS — E11621 Type 2 diabetes mellitus with foot ulcer: Secondary | ICD-10-CM

## 2023-05-05 DIAGNOSIS — L97514 Non-pressure chronic ulcer of other part of right foot with necrosis of bone: Secondary | ICD-10-CM | POA: Diagnosis not present

## 2023-05-05 DIAGNOSIS — E1169 Type 2 diabetes mellitus with other specified complication: Secondary | ICD-10-CM | POA: Diagnosis not present

## 2023-05-05 LAB — GLUCOSE, CAPILLARY
Glucose-Capillary: 228 mg/dL — ABNORMAL HIGH (ref 70–99)
Glucose-Capillary: 206 mg/dL — ABNORMAL HIGH (ref 70–99)

## 2023-05-08 ENCOUNTER — Encounter (HOSPITAL_BASED_OUTPATIENT_CLINIC_OR_DEPARTMENT_OTHER): Payer: PPO | Attending: Internal Medicine | Admitting: Internal Medicine

## 2023-05-08 DIAGNOSIS — E11621 Type 2 diabetes mellitus with foot ulcer: Secondary | ICD-10-CM | POA: Insufficient documentation

## 2023-05-08 DIAGNOSIS — M86371 Chronic multifocal osteomyelitis, right ankle and foot: Secondary | ICD-10-CM | POA: Insufficient documentation

## 2023-05-08 DIAGNOSIS — L97514 Non-pressure chronic ulcer of other part of right foot with necrosis of bone: Secondary | ICD-10-CM | POA: Insufficient documentation

## 2023-05-08 DIAGNOSIS — E1169 Type 2 diabetes mellitus with other specified complication: Secondary | ICD-10-CM | POA: Insufficient documentation

## 2023-05-08 DIAGNOSIS — I70235 Atherosclerosis of native arteries of right leg with ulceration of other part of foot: Secondary | ICD-10-CM | POA: Diagnosis not present

## 2023-05-08 LAB — GLUCOSE, CAPILLARY
Glucose-Capillary: 232 mg/dL — ABNORMAL HIGH (ref 70–99)
Glucose-Capillary: 179 mg/dL — ABNORMAL HIGH (ref 70–99)

## 2023-05-09 ENCOUNTER — Encounter (HOSPITAL_BASED_OUTPATIENT_CLINIC_OR_DEPARTMENT_OTHER): Payer: PPO | Admitting: Internal Medicine

## 2023-05-09 DIAGNOSIS — L97514 Non-pressure chronic ulcer of other part of right foot with necrosis of bone: Secondary | ICD-10-CM

## 2023-05-09 DIAGNOSIS — E11621 Type 2 diabetes mellitus with foot ulcer: Secondary | ICD-10-CM | POA: Diagnosis not present

## 2023-05-09 DIAGNOSIS — E1169 Type 2 diabetes mellitus with other specified complication: Secondary | ICD-10-CM | POA: Diagnosis not present

## 2023-05-09 LAB — GLUCOSE, CAPILLARY
Glucose-Capillary: 161 mg/dL — ABNORMAL HIGH (ref 70–99)
Glucose-Capillary: 187 mg/dL — ABNORMAL HIGH (ref 70–99)

## 2023-05-10 ENCOUNTER — Encounter (HOSPITAL_BASED_OUTPATIENT_CLINIC_OR_DEPARTMENT_OTHER): Payer: PPO | Admitting: Internal Medicine

## 2023-05-10 DIAGNOSIS — E1169 Type 2 diabetes mellitus with other specified complication: Secondary | ICD-10-CM | POA: Diagnosis not present

## 2023-05-10 DIAGNOSIS — M86371 Chronic multifocal osteomyelitis, right ankle and foot: Secondary | ICD-10-CM

## 2023-05-10 DIAGNOSIS — L97514 Non-pressure chronic ulcer of other part of right foot with necrosis of bone: Secondary | ICD-10-CM

## 2023-05-10 DIAGNOSIS — E11621 Type 2 diabetes mellitus with foot ulcer: Secondary | ICD-10-CM | POA: Diagnosis not present

## 2023-05-10 LAB — GLUCOSE, CAPILLARY
Glucose-Capillary: 158 mg/dL — ABNORMAL HIGH (ref 70–99)
Glucose-Capillary: 152 mg/dL — ABNORMAL HIGH (ref 70–99)

## 2023-05-11 ENCOUNTER — Encounter (HOSPITAL_BASED_OUTPATIENT_CLINIC_OR_DEPARTMENT_OTHER): Payer: PPO | Admitting: Internal Medicine

## 2023-05-11 DIAGNOSIS — M86371 Chronic multifocal osteomyelitis, right ankle and foot: Secondary | ICD-10-CM | POA: Diagnosis not present

## 2023-05-11 DIAGNOSIS — L97514 Non-pressure chronic ulcer of other part of right foot with necrosis of bone: Secondary | ICD-10-CM

## 2023-05-11 DIAGNOSIS — E1169 Type 2 diabetes mellitus with other specified complication: Secondary | ICD-10-CM | POA: Diagnosis not present

## 2023-05-11 DIAGNOSIS — I70235 Atherosclerosis of native arteries of right leg with ulceration of other part of foot: Secondary | ICD-10-CM

## 2023-05-11 DIAGNOSIS — E11621 Type 2 diabetes mellitus with foot ulcer: Secondary | ICD-10-CM

## 2023-05-11 LAB — GLUCOSE, CAPILLARY
Glucose-Capillary: 197 mg/dL — ABNORMAL HIGH (ref 70–99)
Glucose-Capillary: 162 mg/dL — ABNORMAL HIGH (ref 70–99)

## 2023-05-12 ENCOUNTER — Encounter (HOSPITAL_BASED_OUTPATIENT_CLINIC_OR_DEPARTMENT_OTHER): Payer: PPO | Admitting: Internal Medicine

## 2023-05-12 DIAGNOSIS — I70235 Atherosclerosis of native arteries of right leg with ulceration of other part of foot: Secondary | ICD-10-CM | POA: Diagnosis not present

## 2023-05-12 DIAGNOSIS — L97514 Non-pressure chronic ulcer of other part of right foot with necrosis of bone: Secondary | ICD-10-CM | POA: Diagnosis not present

## 2023-05-12 DIAGNOSIS — M86371 Chronic multifocal osteomyelitis, right ankle and foot: Secondary | ICD-10-CM

## 2023-05-12 DIAGNOSIS — E11621 Type 2 diabetes mellitus with foot ulcer: Secondary | ICD-10-CM

## 2023-05-12 DIAGNOSIS — E1169 Type 2 diabetes mellitus with other specified complication: Secondary | ICD-10-CM | POA: Diagnosis not present

## 2023-05-12 LAB — GLUCOSE, CAPILLARY
Glucose-Capillary: 233 mg/dL — ABNORMAL HIGH (ref 70–99)
Glucose-Capillary: 194 mg/dL — ABNORMAL HIGH (ref 70–99)

## 2023-05-15 ENCOUNTER — Encounter (HOSPITAL_BASED_OUTPATIENT_CLINIC_OR_DEPARTMENT_OTHER): Payer: PPO | Admitting: Internal Medicine

## 2023-05-15 DIAGNOSIS — M86371 Chronic multifocal osteomyelitis, right ankle and foot: Secondary | ICD-10-CM | POA: Diagnosis not present

## 2023-05-15 DIAGNOSIS — L97514 Non-pressure chronic ulcer of other part of right foot with necrosis of bone: Secondary | ICD-10-CM

## 2023-05-15 DIAGNOSIS — E1169 Type 2 diabetes mellitus with other specified complication: Secondary | ICD-10-CM | POA: Diagnosis not present

## 2023-05-15 DIAGNOSIS — E11621 Type 2 diabetes mellitus with foot ulcer: Secondary | ICD-10-CM | POA: Diagnosis not present

## 2023-05-15 LAB — GLUCOSE, CAPILLARY
Glucose-Capillary: 229 mg/dL — ABNORMAL HIGH (ref 70–99)
Glucose-Capillary: 200 mg/dL — ABNORMAL HIGH (ref 70–99)

## 2023-05-16 ENCOUNTER — Encounter (HOSPITAL_BASED_OUTPATIENT_CLINIC_OR_DEPARTMENT_OTHER): Payer: PPO | Admitting: Internal Medicine

## 2023-05-16 DIAGNOSIS — E11621 Type 2 diabetes mellitus with foot ulcer: Secondary | ICD-10-CM

## 2023-05-16 DIAGNOSIS — I70235 Atherosclerosis of native arteries of right leg with ulceration of other part of foot: Secondary | ICD-10-CM | POA: Diagnosis not present

## 2023-05-16 DIAGNOSIS — M86371 Chronic multifocal osteomyelitis, right ankle and foot: Secondary | ICD-10-CM | POA: Diagnosis not present

## 2023-05-16 DIAGNOSIS — E1169 Type 2 diabetes mellitus with other specified complication: Secondary | ICD-10-CM | POA: Diagnosis not present

## 2023-05-16 DIAGNOSIS — L97514 Non-pressure chronic ulcer of other part of right foot with necrosis of bone: Secondary | ICD-10-CM

## 2023-05-16 LAB — GLUCOSE, CAPILLARY
Glucose-Capillary: 222 mg/dL — ABNORMAL HIGH (ref 70–99)
Glucose-Capillary: 189 mg/dL — ABNORMAL HIGH (ref 70–99)

## 2023-05-17 ENCOUNTER — Encounter (HOSPITAL_BASED_OUTPATIENT_CLINIC_OR_DEPARTMENT_OTHER): Payer: PPO | Admitting: Internal Medicine

## 2023-05-17 DIAGNOSIS — M86371 Chronic multifocal osteomyelitis, right ankle and foot: Secondary | ICD-10-CM | POA: Diagnosis not present

## 2023-05-17 DIAGNOSIS — E11621 Type 2 diabetes mellitus with foot ulcer: Secondary | ICD-10-CM | POA: Diagnosis not present

## 2023-05-17 DIAGNOSIS — E1169 Type 2 diabetes mellitus with other specified complication: Secondary | ICD-10-CM | POA: Diagnosis not present

## 2023-05-17 DIAGNOSIS — I70235 Atherosclerosis of native arteries of right leg with ulceration of other part of foot: Secondary | ICD-10-CM

## 2023-05-17 DIAGNOSIS — L97514 Non-pressure chronic ulcer of other part of right foot with necrosis of bone: Secondary | ICD-10-CM | POA: Diagnosis not present

## 2023-05-17 LAB — GLUCOSE, CAPILLARY
Glucose-Capillary: 193 mg/dL — ABNORMAL HIGH (ref 70–99)
Glucose-Capillary: 160 mg/dL — ABNORMAL HIGH (ref 70–99)

## 2023-05-18 ENCOUNTER — Encounter (HOSPITAL_BASED_OUTPATIENT_CLINIC_OR_DEPARTMENT_OTHER): Payer: PPO | Admitting: Internal Medicine

## 2023-05-18 DIAGNOSIS — M86371 Chronic multifocal osteomyelitis, right ankle and foot: Secondary | ICD-10-CM | POA: Diagnosis not present

## 2023-05-18 DIAGNOSIS — I70235 Atherosclerosis of native arteries of right leg with ulceration of other part of foot: Secondary | ICD-10-CM | POA: Diagnosis not present

## 2023-05-18 DIAGNOSIS — E11621 Type 2 diabetes mellitus with foot ulcer: Secondary | ICD-10-CM | POA: Diagnosis not present

## 2023-05-18 DIAGNOSIS — E1169 Type 2 diabetes mellitus with other specified complication: Secondary | ICD-10-CM | POA: Diagnosis not present

## 2023-05-18 DIAGNOSIS — L97514 Non-pressure chronic ulcer of other part of right foot with necrosis of bone: Secondary | ICD-10-CM

## 2023-05-18 LAB — GLUCOSE, CAPILLARY
Glucose-Capillary: 207 mg/dL — ABNORMAL HIGH (ref 70–99)
Glucose-Capillary: 171 mg/dL — ABNORMAL HIGH (ref 70–99)

## 2023-05-19 ENCOUNTER — Encounter (HOSPITAL_BASED_OUTPATIENT_CLINIC_OR_DEPARTMENT_OTHER): Payer: PPO | Admitting: Internal Medicine

## 2023-05-19 DIAGNOSIS — E11621 Type 2 diabetes mellitus with foot ulcer: Secondary | ICD-10-CM

## 2023-05-19 DIAGNOSIS — M86371 Chronic multifocal osteomyelitis, right ankle and foot: Secondary | ICD-10-CM

## 2023-05-19 DIAGNOSIS — E1169 Type 2 diabetes mellitus with other specified complication: Secondary | ICD-10-CM | POA: Diagnosis not present

## 2023-05-19 DIAGNOSIS — L97514 Non-pressure chronic ulcer of other part of right foot with necrosis of bone: Secondary | ICD-10-CM | POA: Diagnosis not present

## 2023-05-19 DIAGNOSIS — I70235 Atherosclerosis of native arteries of right leg with ulceration of other part of foot: Secondary | ICD-10-CM | POA: Diagnosis not present

## 2023-05-19 LAB — GLUCOSE, CAPILLARY
Glucose-Capillary: 198 mg/dL — ABNORMAL HIGH (ref 70–99)
Glucose-Capillary: 190 mg/dL — ABNORMAL HIGH (ref 70–99)

## 2023-05-22 ENCOUNTER — Other Ambulatory Visit: Payer: Self-pay | Admitting: Student

## 2023-05-22 ENCOUNTER — Encounter (HOSPITAL_BASED_OUTPATIENT_CLINIC_OR_DEPARTMENT_OTHER): Payer: PPO | Admitting: Internal Medicine

## 2023-05-22 DIAGNOSIS — I70235 Atherosclerosis of native arteries of right leg with ulceration of other part of foot: Secondary | ICD-10-CM

## 2023-05-22 DIAGNOSIS — M86371 Chronic multifocal osteomyelitis, right ankle and foot: Secondary | ICD-10-CM

## 2023-05-22 DIAGNOSIS — E11621 Type 2 diabetes mellitus with foot ulcer: Secondary | ICD-10-CM | POA: Diagnosis not present

## 2023-05-22 DIAGNOSIS — L97514 Non-pressure chronic ulcer of other part of right foot with necrosis of bone: Secondary | ICD-10-CM

## 2023-05-22 DIAGNOSIS — L309 Dermatitis, unspecified: Secondary | ICD-10-CM

## 2023-05-22 DIAGNOSIS — E1169 Type 2 diabetes mellitus with other specified complication: Secondary | ICD-10-CM | POA: Diagnosis not present

## 2023-05-22 LAB — GLUCOSE, CAPILLARY
Glucose-Capillary: 243 mg/dL — ABNORMAL HIGH (ref 70–99)
Glucose-Capillary: 153 mg/dL — ABNORMAL HIGH (ref 70–99)

## 2023-05-23 ENCOUNTER — Encounter (HOSPITAL_BASED_OUTPATIENT_CLINIC_OR_DEPARTMENT_OTHER): Admitting: Internal Medicine

## 2023-05-23 ENCOUNTER — Encounter (HOSPITAL_BASED_OUTPATIENT_CLINIC_OR_DEPARTMENT_OTHER): Payer: PPO | Admitting: Internal Medicine

## 2023-05-23 DIAGNOSIS — M86371 Chronic multifocal osteomyelitis, right ankle and foot: Secondary | ICD-10-CM | POA: Diagnosis not present

## 2023-05-23 DIAGNOSIS — I70235 Atherosclerosis of native arteries of right leg with ulceration of other part of foot: Secondary | ICD-10-CM

## 2023-05-23 DIAGNOSIS — L97514 Non-pressure chronic ulcer of other part of right foot with necrosis of bone: Secondary | ICD-10-CM

## 2023-05-23 DIAGNOSIS — E11621 Type 2 diabetes mellitus with foot ulcer: Secondary | ICD-10-CM

## 2023-05-23 DIAGNOSIS — E1169 Type 2 diabetes mellitus with other specified complication: Secondary | ICD-10-CM | POA: Diagnosis not present

## 2023-05-23 LAB — GLUCOSE, CAPILLARY
Glucose-Capillary: 188 mg/dL — ABNORMAL HIGH (ref 70–99)
Glucose-Capillary: 154 mg/dL — ABNORMAL HIGH (ref 70–99)

## 2023-05-24 ENCOUNTER — Encounter (HOSPITAL_BASED_OUTPATIENT_CLINIC_OR_DEPARTMENT_OTHER): Payer: PPO | Admitting: Internal Medicine

## 2023-05-24 DIAGNOSIS — I70235 Atherosclerosis of native arteries of right leg with ulceration of other part of foot: Secondary | ICD-10-CM

## 2023-05-24 DIAGNOSIS — L97514 Non-pressure chronic ulcer of other part of right foot with necrosis of bone: Secondary | ICD-10-CM | POA: Diagnosis not present

## 2023-05-24 DIAGNOSIS — E1169 Type 2 diabetes mellitus with other specified complication: Secondary | ICD-10-CM | POA: Diagnosis not present

## 2023-05-24 DIAGNOSIS — M86371 Chronic multifocal osteomyelitis, right ankle and foot: Secondary | ICD-10-CM

## 2023-05-24 DIAGNOSIS — E11621 Type 2 diabetes mellitus with foot ulcer: Secondary | ICD-10-CM

## 2023-05-24 LAB — GLUCOSE, CAPILLARY
Glucose-Capillary: 199 mg/dL — ABNORMAL HIGH (ref 70–99)
Glucose-Capillary: 168 mg/dL — ABNORMAL HIGH (ref 70–99)
Glucose-Capillary: 194 mg/dL — ABNORMAL HIGH (ref 70–99)

## 2023-05-25 ENCOUNTER — Encounter (HOSPITAL_BASED_OUTPATIENT_CLINIC_OR_DEPARTMENT_OTHER): Payer: PPO | Admitting: Internal Medicine

## 2023-05-25 DIAGNOSIS — I70235 Atherosclerosis of native arteries of right leg with ulceration of other part of foot: Secondary | ICD-10-CM

## 2023-05-25 DIAGNOSIS — L97514 Non-pressure chronic ulcer of other part of right foot with necrosis of bone: Secondary | ICD-10-CM

## 2023-05-25 DIAGNOSIS — E11621 Type 2 diabetes mellitus with foot ulcer: Secondary | ICD-10-CM | POA: Diagnosis not present

## 2023-05-25 DIAGNOSIS — M86371 Chronic multifocal osteomyelitis, right ankle and foot: Secondary | ICD-10-CM

## 2023-05-25 DIAGNOSIS — E1169 Type 2 diabetes mellitus with other specified complication: Secondary | ICD-10-CM | POA: Diagnosis not present

## 2023-05-25 LAB — GLUCOSE, CAPILLARY
Glucose-Capillary: 277 mg/dL — ABNORMAL HIGH (ref 70–99)
Glucose-Capillary: 153 mg/dL — ABNORMAL HIGH (ref 70–99)

## 2023-05-26 ENCOUNTER — Encounter (HOSPITAL_BASED_OUTPATIENT_CLINIC_OR_DEPARTMENT_OTHER): Admitting: Internal Medicine

## 2023-05-26 DIAGNOSIS — E11621 Type 2 diabetes mellitus with foot ulcer: Secondary | ICD-10-CM | POA: Diagnosis not present

## 2023-05-26 DIAGNOSIS — L97514 Non-pressure chronic ulcer of other part of right foot with necrosis of bone: Secondary | ICD-10-CM | POA: Diagnosis not present

## 2023-05-26 DIAGNOSIS — E1169 Type 2 diabetes mellitus with other specified complication: Secondary | ICD-10-CM | POA: Diagnosis not present

## 2023-05-26 DIAGNOSIS — M86371 Chronic multifocal osteomyelitis, right ankle and foot: Secondary | ICD-10-CM | POA: Diagnosis not present

## 2023-05-26 DIAGNOSIS — I70235 Atherosclerosis of native arteries of right leg with ulceration of other part of foot: Secondary | ICD-10-CM | POA: Diagnosis not present

## 2023-05-26 LAB — GLUCOSE, CAPILLARY
Glucose-Capillary: 223 mg/dL — ABNORMAL HIGH (ref 70–99)
Glucose-Capillary: 188 mg/dL — ABNORMAL HIGH (ref 70–99)

## 2023-05-29 ENCOUNTER — Encounter (HOSPITAL_BASED_OUTPATIENT_CLINIC_OR_DEPARTMENT_OTHER): Admitting: Internal Medicine

## 2023-05-29 DIAGNOSIS — I70235 Atherosclerosis of native arteries of right leg with ulceration of other part of foot: Secondary | ICD-10-CM

## 2023-05-29 DIAGNOSIS — E11621 Type 2 diabetes mellitus with foot ulcer: Secondary | ICD-10-CM

## 2023-05-29 DIAGNOSIS — M86371 Chronic multifocal osteomyelitis, right ankle and foot: Secondary | ICD-10-CM

## 2023-05-29 DIAGNOSIS — L97514 Non-pressure chronic ulcer of other part of right foot with necrosis of bone: Secondary | ICD-10-CM

## 2023-05-29 DIAGNOSIS — E1169 Type 2 diabetes mellitus with other specified complication: Secondary | ICD-10-CM | POA: Diagnosis not present

## 2023-05-29 LAB — GLUCOSE, CAPILLARY
Glucose-Capillary: 242 mg/dL — ABNORMAL HIGH (ref 70–99)
Glucose-Capillary: 181 mg/dL — ABNORMAL HIGH (ref 70–99)

## 2023-05-30 ENCOUNTER — Encounter (HOSPITAL_BASED_OUTPATIENT_CLINIC_OR_DEPARTMENT_OTHER): Admitting: Internal Medicine

## 2023-05-30 DIAGNOSIS — L97514 Non-pressure chronic ulcer of other part of right foot with necrosis of bone: Secondary | ICD-10-CM | POA: Diagnosis not present

## 2023-05-30 DIAGNOSIS — E11621 Type 2 diabetes mellitus with foot ulcer: Secondary | ICD-10-CM

## 2023-05-30 DIAGNOSIS — E1169 Type 2 diabetes mellitus with other specified complication: Secondary | ICD-10-CM | POA: Diagnosis not present

## 2023-05-30 DIAGNOSIS — M86371 Chronic multifocal osteomyelitis, right ankle and foot: Secondary | ICD-10-CM

## 2023-05-30 DIAGNOSIS — I70235 Atherosclerosis of native arteries of right leg with ulceration of other part of foot: Secondary | ICD-10-CM

## 2023-05-30 LAB — GLUCOSE, CAPILLARY
Glucose-Capillary: 208 mg/dL — ABNORMAL HIGH (ref 70–99)
Glucose-Capillary: 179 mg/dL — ABNORMAL HIGH (ref 70–99)

## 2023-05-30 NOTE — Patient Instructions (Incomplete)
 It was great to see you! Thank you for allowing me to participate in your care!  I recommend that you always bring your medications to each appointment as this makes it easy to ensure we are on the correct medications and helps [place] not miss when refills are needed.  Our plans for today:  - Medication Refill -   We are checking some labs today, I will call you if they are abnormal will send you a MyChart message or a letter if they are normal.  If you do not hear about your labs in the next 2 weeks please let us know.***  Take care and seek immediate care sooner if you develop any concerns.   Dr. [name], MD [facility]

## 2023-05-30 NOTE — Progress Notes (Signed)
  SUBJECTIVE:   CHIEF COMPLAINT / HPI:   Medication Refill Wanting refill of his allergy medicine today.   Erectile dysfunction Has been an issue for 2-4 years, and is not having erections any more. Recently found a partner he is interested in and would like to be sexually active with.   PERTINENT  PMH / PSH:    OBJECTIVE:  BP 126/78   Pulse 96   Ht 5\' 11"  (1.803 m)   Wt 170 lb (77.1 kg)   SpO2 96%   BMI 23.71 kg/m  Physical Exam Constitutional:      General: He is not in acute distress.    Appearance: Normal appearance. He is not ill-appearing.  Cardiovascular:     Rate and Rhythm: Normal rate and regular rhythm.     Pulses: Normal pulses.     Heart sounds: Normal heart sounds. No murmur heard.    No friction rub. No gallop.  Pulmonary:     Effort: Pulmonary effort is normal. No respiratory distress.     Breath sounds: Normal breath sounds. No stridor. No wheezing, rhonchi or rales.  Neurological:     Mental Status: He is alert.      ASSESSMENT/PLAN:   Assessment & Plan Seasonal allergies Patient comes in for refill of his allergy medicine. - Xyzal 5 mg twice daily Erectile dysfunction, unspecified erectile dysfunction type Patient comes in with complaint of erectile dysfunction.  Patient notes this been an issue for 2 to 4 years.  Patient notes he has not had sex in over 2 to 4 years.  Patient notes he no longer gets erections, but has found a new partner and would like to engage in sexual activity.  Discussed possibility that medication may not allow for erections.  Also discussed precautions with medication and prolonged erection.  Patient not on nitrates, with well-controlled blood pressure, okay to start medication. - Viagra 25 mg 30 minutes before sexual activity No follow-ups on file. [name], MD 06/01/2023, 11:15 AM PGY-3, [facility]

## 2023-05-31 ENCOUNTER — Encounter (HOSPITAL_BASED_OUTPATIENT_CLINIC_OR_DEPARTMENT_OTHER): Admitting: Internal Medicine

## 2023-05-31 DIAGNOSIS — L97514 Non-pressure chronic ulcer of other part of right foot with necrosis of bone: Secondary | ICD-10-CM | POA: Diagnosis not present

## 2023-05-31 DIAGNOSIS — E11621 Type 2 diabetes mellitus with foot ulcer: Secondary | ICD-10-CM

## 2023-05-31 DIAGNOSIS — I70235 Atherosclerosis of native arteries of right leg with ulceration of other part of foot: Secondary | ICD-10-CM | POA: Diagnosis not present

## 2023-05-31 DIAGNOSIS — M86371 Chronic multifocal osteomyelitis, right ankle and foot: Secondary | ICD-10-CM | POA: Diagnosis not present

## 2023-05-31 DIAGNOSIS — E1169 Type 2 diabetes mellitus with other specified complication: Secondary | ICD-10-CM | POA: Diagnosis not present

## 2023-05-31 LAB — GLUCOSE, CAPILLARY
Glucose-Capillary: 263 mg/dL — ABNORMAL HIGH (ref 70–99)
Glucose-Capillary: 215 mg/dL — ABNORMAL HIGH (ref 70–99)

## 2023-06-01 ENCOUNTER — Ambulatory Visit (INDEPENDENT_AMBULATORY_CARE_PROVIDER_SITE_OTHER): Admitting: Student

## 2023-06-01 ENCOUNTER — Encounter (HOSPITAL_BASED_OUTPATIENT_CLINIC_OR_DEPARTMENT_OTHER): Admitting: Internal Medicine

## 2023-06-01 VITALS — BP 126/78 | HR 96 | Ht 71.0 in | Wt 170.0 lb

## 2023-06-01 DIAGNOSIS — M86371 Chronic multifocal osteomyelitis, right ankle and foot: Secondary | ICD-10-CM

## 2023-06-01 DIAGNOSIS — J302 Other seasonal allergic rhinitis: Secondary | ICD-10-CM | POA: Diagnosis not present

## 2023-06-01 DIAGNOSIS — L97514 Non-pressure chronic ulcer of other part of right foot with necrosis of bone: Secondary | ICD-10-CM | POA: Diagnosis not present

## 2023-06-01 DIAGNOSIS — N529 Male erectile dysfunction, unspecified: Secondary | ICD-10-CM

## 2023-06-01 DIAGNOSIS — E11621 Type 2 diabetes mellitus with foot ulcer: Secondary | ICD-10-CM

## 2023-06-01 DIAGNOSIS — E1169 Type 2 diabetes mellitus with other specified complication: Secondary | ICD-10-CM | POA: Diagnosis not present

## 2023-06-01 LAB — GLUCOSE, CAPILLARY
Glucose-Capillary: 198 mg/dL — ABNORMAL HIGH (ref 70–99)
Glucose-Capillary: 145 mg/dL — ABNORMAL HIGH (ref 70–99)

## 2023-06-01 MED ORDER — TIOTROPIUM BROMIDE MONOHYDRATE 18 MCG IN CAPS: ORAL_CAPSULE | RESPIRATORY_TRACT | 5 refills | Status: AC

## 2023-06-01 MED ORDER — SILDENAFIL CITRATE 25 MG PO TABS: 25.0000 mg | ORAL_TABLET | Freq: Every day | ORAL | 0 refills | Status: DC | PRN

## 2023-06-01 MED ORDER — LEVOCETIRIZINE DIHYDROCHLORIDE 5 MG PO TABS: 5.0000 mg | ORAL_TABLET | Freq: Two times a day (BID) | ORAL | 3 refills | Status: DC

## 2023-06-01 NOTE — Assessment & Plan Note (Addendum)
 Patient comes in with complaint of erectile dysfunction.  Patient notes this been an issue for 2 to 4 years.  Patient notes he has not had sex in over 2 to 4 years.  Patient notes he no longer gets erections, but has found a new partner and would like to engage in sexual activity.  Discussed possibility that medication may not allow for erections.  Also discussed precautions with medication and prolonged erection.  Patient not on nitrates, with well-controlled blood pressure, okay to start medication. - Viagra 25 mg 30 minutes before sexual activity

## 2023-06-01 NOTE — Assessment & Plan Note (Addendum)
 Patient comes in for refill of his allergy medicine. - Xyzal 5 mg twice daily

## 2023-06-02 ENCOUNTER — Encounter (HOSPITAL_BASED_OUTPATIENT_CLINIC_OR_DEPARTMENT_OTHER): Admitting: Internal Medicine

## 2023-06-02 DIAGNOSIS — M86371 Chronic multifocal osteomyelitis, right ankle and foot: Secondary | ICD-10-CM

## 2023-06-02 DIAGNOSIS — L97514 Non-pressure chronic ulcer of other part of right foot with necrosis of bone: Secondary | ICD-10-CM | POA: Diagnosis not present

## 2023-06-02 DIAGNOSIS — E11621 Type 2 diabetes mellitus with foot ulcer: Secondary | ICD-10-CM | POA: Diagnosis not present

## 2023-06-02 DIAGNOSIS — E1169 Type 2 diabetes mellitus with other specified complication: Secondary | ICD-10-CM | POA: Diagnosis not present

## 2023-06-02 LAB — GLUCOSE, CAPILLARY
Glucose-Capillary: 143 mg/dL — ABNORMAL HIGH (ref 70–99)
Glucose-Capillary: 250 mg/dL — ABNORMAL HIGH (ref 70–99)

## 2023-06-05 ENCOUNTER — Encounter (HOSPITAL_BASED_OUTPATIENT_CLINIC_OR_DEPARTMENT_OTHER): Admitting: Internal Medicine

## 2023-06-05 DIAGNOSIS — I70235 Atherosclerosis of native arteries of right leg with ulceration of other part of foot: Secondary | ICD-10-CM | POA: Diagnosis not present

## 2023-06-05 DIAGNOSIS — E11621 Type 2 diabetes mellitus with foot ulcer: Secondary | ICD-10-CM

## 2023-06-05 DIAGNOSIS — M86371 Chronic multifocal osteomyelitis, right ankle and foot: Secondary | ICD-10-CM

## 2023-06-05 DIAGNOSIS — L97514 Non-pressure chronic ulcer of other part of right foot with necrosis of bone: Secondary | ICD-10-CM

## 2023-06-05 DIAGNOSIS — E1169 Type 2 diabetes mellitus with other specified complication: Secondary | ICD-10-CM | POA: Diagnosis not present

## 2023-06-05 LAB — GLUCOSE, CAPILLARY
Glucose-Capillary: 203 mg/dL — ABNORMAL HIGH (ref 70–99)
Glucose-Capillary: 198 mg/dL — ABNORMAL HIGH (ref 70–99)

## 2023-06-06 ENCOUNTER — Encounter (HOSPITAL_BASED_OUTPATIENT_CLINIC_OR_DEPARTMENT_OTHER): Admitting: Internal Medicine

## 2023-06-06 ENCOUNTER — Encounter (HOSPITAL_BASED_OUTPATIENT_CLINIC_OR_DEPARTMENT_OTHER): Attending: Internal Medicine | Admitting: Internal Medicine

## 2023-06-06 DIAGNOSIS — I70235 Atherosclerosis of native arteries of right leg with ulceration of other part of foot: Secondary | ICD-10-CM | POA: Insufficient documentation

## 2023-06-06 DIAGNOSIS — M86371 Chronic multifocal osteomyelitis, right ankle and foot: Secondary | ICD-10-CM | POA: Insufficient documentation

## 2023-06-06 DIAGNOSIS — L97514 Non-pressure chronic ulcer of other part of right foot with necrosis of bone: Secondary | ICD-10-CM | POA: Insufficient documentation

## 2023-06-06 DIAGNOSIS — E11621 Type 2 diabetes mellitus with foot ulcer: Secondary | ICD-10-CM

## 2023-06-06 LAB — GLUCOSE, CAPILLARY
Glucose-Capillary: 167 mg/dL — ABNORMAL HIGH (ref 70–99)
Glucose-Capillary: 186 mg/dL — ABNORMAL HIGH (ref 70–99)

## 2023-06-07 ENCOUNTER — Encounter (HOSPITAL_BASED_OUTPATIENT_CLINIC_OR_DEPARTMENT_OTHER): Admitting: Internal Medicine

## 2023-06-07 DIAGNOSIS — L97514 Non-pressure chronic ulcer of other part of right foot with necrosis of bone: Secondary | ICD-10-CM | POA: Diagnosis not present

## 2023-06-07 DIAGNOSIS — E11621 Type 2 diabetes mellitus with foot ulcer: Secondary | ICD-10-CM | POA: Diagnosis not present

## 2023-06-07 DIAGNOSIS — M86371 Chronic multifocal osteomyelitis, right ankle and foot: Secondary | ICD-10-CM

## 2023-06-07 DIAGNOSIS — I70235 Atherosclerosis of native arteries of right leg with ulceration of other part of foot: Secondary | ICD-10-CM | POA: Diagnosis not present

## 2023-06-07 LAB — GLUCOSE, CAPILLARY: Glucose-Capillary: 168 mg/dL — ABNORMAL HIGH (ref 70–99)

## 2023-06-08 ENCOUNTER — Encounter (HOSPITAL_BASED_OUTPATIENT_CLINIC_OR_DEPARTMENT_OTHER): Admitting: Internal Medicine

## 2023-06-08 DIAGNOSIS — L97514 Non-pressure chronic ulcer of other part of right foot with necrosis of bone: Secondary | ICD-10-CM

## 2023-06-08 DIAGNOSIS — E11621 Type 2 diabetes mellitus with foot ulcer: Secondary | ICD-10-CM

## 2023-06-08 DIAGNOSIS — M86371 Chronic multifocal osteomyelitis, right ankle and foot: Secondary | ICD-10-CM

## 2023-06-08 LAB — GLUCOSE, CAPILLARY
Glucose-Capillary: 161 mg/dL — ABNORMAL HIGH (ref 70–99)
Glucose-Capillary: 206 mg/dL — ABNORMAL HIGH (ref 70–99)
Glucose-Capillary: 135 mg/dL — ABNORMAL HIGH (ref 70–99)

## 2023-06-09 ENCOUNTER — Encounter (HOSPITAL_BASED_OUTPATIENT_CLINIC_OR_DEPARTMENT_OTHER): Admitting: Internal Medicine

## 2023-06-09 DIAGNOSIS — I70235 Atherosclerosis of native arteries of right leg with ulceration of other part of foot: Secondary | ICD-10-CM | POA: Diagnosis not present

## 2023-06-09 DIAGNOSIS — M86371 Chronic multifocal osteomyelitis, right ankle and foot: Secondary | ICD-10-CM

## 2023-06-09 DIAGNOSIS — E11621 Type 2 diabetes mellitus with foot ulcer: Secondary | ICD-10-CM | POA: Diagnosis not present

## 2023-06-09 DIAGNOSIS — L97514 Non-pressure chronic ulcer of other part of right foot with necrosis of bone: Secondary | ICD-10-CM

## 2023-06-09 LAB — GLUCOSE, CAPILLARY
Glucose-Capillary: 250 mg/dL — ABNORMAL HIGH (ref 70–99)
Glucose-Capillary: 244 mg/dL — ABNORMAL HIGH (ref 70–99)

## 2023-06-12 ENCOUNTER — Encounter (HOSPITAL_BASED_OUTPATIENT_CLINIC_OR_DEPARTMENT_OTHER): Admitting: Internal Medicine

## 2023-06-12 DIAGNOSIS — L97514 Non-pressure chronic ulcer of other part of right foot with necrosis of bone: Secondary | ICD-10-CM | POA: Diagnosis not present

## 2023-06-12 DIAGNOSIS — M86371 Chronic multifocal osteomyelitis, right ankle and foot: Secondary | ICD-10-CM | POA: Diagnosis not present

## 2023-06-12 DIAGNOSIS — E11621 Type 2 diabetes mellitus with foot ulcer: Secondary | ICD-10-CM

## 2023-06-12 LAB — GLUCOSE, CAPILLARY
Glucose-Capillary: 210 mg/dL — ABNORMAL HIGH (ref 70–99)
Glucose-Capillary: 181 mg/dL — ABNORMAL HIGH (ref 70–99)

## 2023-06-13 ENCOUNTER — Encounter (HOSPITAL_BASED_OUTPATIENT_CLINIC_OR_DEPARTMENT_OTHER): Admitting: Internal Medicine

## 2023-06-13 DIAGNOSIS — E11621 Type 2 diabetes mellitus with foot ulcer: Secondary | ICD-10-CM | POA: Diagnosis not present

## 2023-06-13 DIAGNOSIS — M86371 Chronic multifocal osteomyelitis, right ankle and foot: Secondary | ICD-10-CM

## 2023-06-13 DIAGNOSIS — L97514 Non-pressure chronic ulcer of other part of right foot with necrosis of bone: Secondary | ICD-10-CM

## 2023-06-13 LAB — GLUCOSE, CAPILLARY
Glucose-Capillary: 219 mg/dL — ABNORMAL HIGH (ref 70–99)
Glucose-Capillary: 186 mg/dL — ABNORMAL HIGH (ref 70–99)

## 2023-06-14 ENCOUNTER — Encounter (HOSPITAL_BASED_OUTPATIENT_CLINIC_OR_DEPARTMENT_OTHER): Admitting: Internal Medicine

## 2023-06-14 DIAGNOSIS — E11621 Type 2 diabetes mellitus with foot ulcer: Secondary | ICD-10-CM | POA: Diagnosis not present

## 2023-06-14 DIAGNOSIS — L97514 Non-pressure chronic ulcer of other part of right foot with necrosis of bone: Secondary | ICD-10-CM

## 2023-06-14 DIAGNOSIS — M86371 Chronic multifocal osteomyelitis, right ankle and foot: Secondary | ICD-10-CM | POA: Diagnosis not present

## 2023-06-14 LAB — GLUCOSE, CAPILLARY
Glucose-Capillary: 181 mg/dL — ABNORMAL HIGH (ref 70–99)
Glucose-Capillary: 171 mg/dL — ABNORMAL HIGH (ref 70–99)

## 2023-06-15 ENCOUNTER — Encounter (HOSPITAL_BASED_OUTPATIENT_CLINIC_OR_DEPARTMENT_OTHER): Admitting: Internal Medicine

## 2023-06-15 ENCOUNTER — Other Ambulatory Visit: Payer: Self-pay | Admitting: Student

## 2023-06-15 DIAGNOSIS — M86371 Chronic multifocal osteomyelitis, right ankle and foot: Secondary | ICD-10-CM | POA: Diagnosis not present

## 2023-06-15 DIAGNOSIS — L97514 Non-pressure chronic ulcer of other part of right foot with necrosis of bone: Secondary | ICD-10-CM | POA: Diagnosis not present

## 2023-06-15 DIAGNOSIS — E11621 Type 2 diabetes mellitus with foot ulcer: Secondary | ICD-10-CM | POA: Diagnosis not present

## 2023-06-15 DIAGNOSIS — E1165 Type 2 diabetes mellitus with hyperglycemia: Secondary | ICD-10-CM

## 2023-06-15 LAB — GLUCOSE, CAPILLARY
Glucose-Capillary: 165 mg/dL — ABNORMAL HIGH (ref 70–99)
Glucose-Capillary: 152 mg/dL — ABNORMAL HIGH (ref 70–99)

## 2023-06-16 ENCOUNTER — Encounter (HOSPITAL_BASED_OUTPATIENT_CLINIC_OR_DEPARTMENT_OTHER): Admitting: Internal Medicine

## 2023-06-16 DIAGNOSIS — E11621 Type 2 diabetes mellitus with foot ulcer: Secondary | ICD-10-CM

## 2023-06-16 DIAGNOSIS — L97514 Non-pressure chronic ulcer of other part of right foot with necrosis of bone: Secondary | ICD-10-CM | POA: Diagnosis not present

## 2023-06-16 DIAGNOSIS — M86371 Chronic multifocal osteomyelitis, right ankle and foot: Secondary | ICD-10-CM | POA: Diagnosis not present

## 2023-06-16 LAB — GLUCOSE, CAPILLARY
Glucose-Capillary: 207 mg/dL — ABNORMAL HIGH (ref 70–99)
Glucose-Capillary: 136 mg/dL — ABNORMAL HIGH (ref 70–99)

## 2023-06-19 ENCOUNTER — Other Ambulatory Visit: Payer: Self-pay | Admitting: Student

## 2023-06-19 ENCOUNTER — Encounter (HOSPITAL_BASED_OUTPATIENT_CLINIC_OR_DEPARTMENT_OTHER): Admitting: Internal Medicine

## 2023-06-19 DIAGNOSIS — L309 Dermatitis, unspecified: Secondary | ICD-10-CM

## 2023-06-19 DIAGNOSIS — M1A9XX0 Chronic gout, unspecified, without tophus (tophi): Secondary | ICD-10-CM

## 2023-06-19 DIAGNOSIS — E11621 Type 2 diabetes mellitus with foot ulcer: Secondary | ICD-10-CM | POA: Diagnosis not present

## 2023-06-19 LAB — GLUCOSE, CAPILLARY
Glucose-Capillary: 222 mg/dL — ABNORMAL HIGH (ref 70–99)
Glucose-Capillary: 173 mg/dL — ABNORMAL HIGH (ref 70–99)

## 2023-06-20 ENCOUNTER — Ambulatory Visit (HOSPITAL_BASED_OUTPATIENT_CLINIC_OR_DEPARTMENT_OTHER): Admitting: Internal Medicine

## 2023-06-20 ENCOUNTER — Encounter (HOSPITAL_BASED_OUTPATIENT_CLINIC_OR_DEPARTMENT_OTHER): Admitting: Internal Medicine

## 2023-06-20 DIAGNOSIS — E11621 Type 2 diabetes mellitus with foot ulcer: Secondary | ICD-10-CM | POA: Diagnosis not present

## 2023-06-20 LAB — GLUCOSE, CAPILLARY
Glucose-Capillary: 206 mg/dL — ABNORMAL HIGH (ref 70–99)
Glucose-Capillary: 169 mg/dL — ABNORMAL HIGH (ref 70–99)

## 2023-06-21 ENCOUNTER — Encounter (HOSPITAL_BASED_OUTPATIENT_CLINIC_OR_DEPARTMENT_OTHER): Admitting: Internal Medicine

## 2023-06-21 DIAGNOSIS — E11621 Type 2 diabetes mellitus with foot ulcer: Secondary | ICD-10-CM | POA: Diagnosis not present

## 2023-06-21 LAB — GLUCOSE, CAPILLARY
Glucose-Capillary: 164 mg/dL — ABNORMAL HIGH (ref 70–99)
Glucose-Capillary: 153 mg/dL — ABNORMAL HIGH (ref 70–99)

## 2023-06-22 ENCOUNTER — Encounter (HOSPITAL_BASED_OUTPATIENT_CLINIC_OR_DEPARTMENT_OTHER): Admitting: Internal Medicine

## 2023-06-22 DIAGNOSIS — E11621 Type 2 diabetes mellitus with foot ulcer: Secondary | ICD-10-CM | POA: Diagnosis not present

## 2023-06-22 LAB — GLUCOSE, CAPILLARY
Glucose-Capillary: 174 mg/dL — ABNORMAL HIGH (ref 70–99)
Glucose-Capillary: 144 mg/dL — ABNORMAL HIGH (ref 70–99)

## 2023-06-23 ENCOUNTER — Encounter (HOSPITAL_BASED_OUTPATIENT_CLINIC_OR_DEPARTMENT_OTHER): Admitting: Internal Medicine

## 2023-06-23 DIAGNOSIS — E11621 Type 2 diabetes mellitus with foot ulcer: Secondary | ICD-10-CM | POA: Diagnosis not present

## 2023-06-23 LAB — GLUCOSE, CAPILLARY
Glucose-Capillary: 203 mg/dL — ABNORMAL HIGH (ref 70–99)
Glucose-Capillary: 180 mg/dL — ABNORMAL HIGH (ref 70–99)

## 2023-06-26 ENCOUNTER — Encounter (HOSPITAL_BASED_OUTPATIENT_CLINIC_OR_DEPARTMENT_OTHER): Admitting: Internal Medicine

## 2023-06-26 DIAGNOSIS — M86371 Chronic multifocal osteomyelitis, right ankle and foot: Secondary | ICD-10-CM

## 2023-06-26 DIAGNOSIS — I70235 Atherosclerosis of native arteries of right leg with ulceration of other part of foot: Secondary | ICD-10-CM

## 2023-06-26 DIAGNOSIS — E11621 Type 2 diabetes mellitus with foot ulcer: Secondary | ICD-10-CM | POA: Diagnosis not present

## 2023-06-26 DIAGNOSIS — L97514 Non-pressure chronic ulcer of other part of right foot with necrosis of bone: Secondary | ICD-10-CM | POA: Diagnosis not present

## 2023-06-26 LAB — GLUCOSE, CAPILLARY: Glucose-Capillary: 190 mg/dL — ABNORMAL HIGH (ref 70–99)

## 2023-06-27 ENCOUNTER — Encounter (HOSPITAL_BASED_OUTPATIENT_CLINIC_OR_DEPARTMENT_OTHER): Admitting: Internal Medicine

## 2023-06-27 DIAGNOSIS — I70235 Atherosclerosis of native arteries of right leg with ulceration of other part of foot: Secondary | ICD-10-CM

## 2023-06-27 DIAGNOSIS — E11621 Type 2 diabetes mellitus with foot ulcer: Secondary | ICD-10-CM | POA: Diagnosis not present

## 2023-06-27 DIAGNOSIS — M86371 Chronic multifocal osteomyelitis, right ankle and foot: Secondary | ICD-10-CM | POA: Diagnosis not present

## 2023-06-27 DIAGNOSIS — L97514 Non-pressure chronic ulcer of other part of right foot with necrosis of bone: Secondary | ICD-10-CM

## 2023-06-27 LAB — GLUCOSE, CAPILLARY
Glucose-Capillary: 150 mg/dL — ABNORMAL HIGH (ref 70–99)
Glucose-Capillary: 224 mg/dL — ABNORMAL HIGH (ref 70–99)
Glucose-Capillary: 187 mg/dL — ABNORMAL HIGH (ref 70–99)

## 2023-06-28 ENCOUNTER — Encounter (HOSPITAL_BASED_OUTPATIENT_CLINIC_OR_DEPARTMENT_OTHER): Admitting: Internal Medicine

## 2023-06-28 DIAGNOSIS — L97514 Non-pressure chronic ulcer of other part of right foot with necrosis of bone: Secondary | ICD-10-CM

## 2023-06-28 DIAGNOSIS — E11621 Type 2 diabetes mellitus with foot ulcer: Secondary | ICD-10-CM | POA: Diagnosis not present

## 2023-06-28 DIAGNOSIS — M86371 Chronic multifocal osteomyelitis, right ankle and foot: Secondary | ICD-10-CM

## 2023-06-28 DIAGNOSIS — I70235 Atherosclerosis of native arteries of right leg with ulceration of other part of foot: Secondary | ICD-10-CM

## 2023-06-28 LAB — GLUCOSE, CAPILLARY
Glucose-Capillary: 188 mg/dL — ABNORMAL HIGH (ref 70–99)
Glucose-Capillary: 176 mg/dL — ABNORMAL HIGH (ref 70–99)

## 2023-06-29 ENCOUNTER — Encounter (HOSPITAL_BASED_OUTPATIENT_CLINIC_OR_DEPARTMENT_OTHER): Admitting: Internal Medicine

## 2023-06-29 ENCOUNTER — Ambulatory Visit: Payer: PPO | Admitting: Podiatry

## 2023-06-29 DIAGNOSIS — E11621 Type 2 diabetes mellitus with foot ulcer: Secondary | ICD-10-CM

## 2023-06-29 DIAGNOSIS — M86371 Chronic multifocal osteomyelitis, right ankle and foot: Secondary | ICD-10-CM | POA: Diagnosis not present

## 2023-06-29 DIAGNOSIS — L97514 Non-pressure chronic ulcer of other part of right foot with necrosis of bone: Secondary | ICD-10-CM | POA: Diagnosis not present

## 2023-06-29 DIAGNOSIS — I70235 Atherosclerosis of native arteries of right leg with ulceration of other part of foot: Secondary | ICD-10-CM | POA: Diagnosis not present

## 2023-06-29 LAB — GLUCOSE, CAPILLARY
Glucose-Capillary: 205 mg/dL — ABNORMAL HIGH (ref 70–99)
Glucose-Capillary: 172 mg/dL — ABNORMAL HIGH (ref 70–99)

## 2023-06-30 ENCOUNTER — Encounter (HOSPITAL_BASED_OUTPATIENT_CLINIC_OR_DEPARTMENT_OTHER): Admitting: Internal Medicine

## 2023-06-30 DIAGNOSIS — E11621 Type 2 diabetes mellitus with foot ulcer: Secondary | ICD-10-CM | POA: Diagnosis not present

## 2023-06-30 LAB — GLUCOSE, CAPILLARY
Glucose-Capillary: 231 mg/dL — ABNORMAL HIGH (ref 70–99)
Glucose-Capillary: 251 mg/dL — ABNORMAL HIGH (ref 70–99)

## 2023-07-03 ENCOUNTER — Encounter (HOSPITAL_BASED_OUTPATIENT_CLINIC_OR_DEPARTMENT_OTHER): Admitting: Internal Medicine

## 2023-07-03 DIAGNOSIS — E11621 Type 2 diabetes mellitus with foot ulcer: Secondary | ICD-10-CM | POA: Diagnosis not present

## 2023-07-03 LAB — GLUCOSE, CAPILLARY
Glucose-Capillary: 154 mg/dL — ABNORMAL HIGH (ref 70–99)
Glucose-Capillary: 135 mg/dL — ABNORMAL HIGH (ref 70–99)

## 2023-07-04 ENCOUNTER — Encounter (HOSPITAL_BASED_OUTPATIENT_CLINIC_OR_DEPARTMENT_OTHER): Admitting: Internal Medicine

## 2023-07-04 DIAGNOSIS — M86371 Chronic multifocal osteomyelitis, right ankle and foot: Secondary | ICD-10-CM | POA: Diagnosis not present

## 2023-07-04 DIAGNOSIS — E11621 Type 2 diabetes mellitus with foot ulcer: Secondary | ICD-10-CM | POA: Diagnosis not present

## 2023-07-04 DIAGNOSIS — L97514 Non-pressure chronic ulcer of other part of right foot with necrosis of bone: Secondary | ICD-10-CM

## 2023-07-04 LAB — GLUCOSE, CAPILLARY: Glucose-Capillary: 248 mg/dL — ABNORMAL HIGH (ref 70–99)

## 2023-07-05 ENCOUNTER — Encounter (HOSPITAL_BASED_OUTPATIENT_CLINIC_OR_DEPARTMENT_OTHER): Admitting: Internal Medicine

## 2023-07-05 DIAGNOSIS — I70235 Atherosclerosis of native arteries of right leg with ulceration of other part of foot: Secondary | ICD-10-CM

## 2023-07-05 DIAGNOSIS — E11621 Type 2 diabetes mellitus with foot ulcer: Secondary | ICD-10-CM

## 2023-07-05 DIAGNOSIS — L97514 Non-pressure chronic ulcer of other part of right foot with necrosis of bone: Secondary | ICD-10-CM

## 2023-07-05 DIAGNOSIS — M86371 Chronic multifocal osteomyelitis, right ankle and foot: Secondary | ICD-10-CM | POA: Diagnosis not present

## 2023-07-05 LAB — GLUCOSE, CAPILLARY
Glucose-Capillary: 190 mg/dL — ABNORMAL HIGH (ref 70–99)
Glucose-Capillary: 198 mg/dL — ABNORMAL HIGH (ref 70–99)
Glucose-Capillary: 163 mg/dL — ABNORMAL HIGH (ref 70–99)

## 2023-07-06 ENCOUNTER — Encounter (HOSPITAL_BASED_OUTPATIENT_CLINIC_OR_DEPARTMENT_OTHER): Admitting: Internal Medicine

## 2023-07-07 ENCOUNTER — Encounter (HOSPITAL_BASED_OUTPATIENT_CLINIC_OR_DEPARTMENT_OTHER): Admitting: Internal Medicine

## 2023-07-09 ENCOUNTER — Other Ambulatory Visit: Payer: Self-pay | Admitting: Student

## 2023-07-09 DIAGNOSIS — J449 Chronic obstructive pulmonary disease, unspecified: Secondary | ICD-10-CM

## 2023-07-10 ENCOUNTER — Encounter (HOSPITAL_BASED_OUTPATIENT_CLINIC_OR_DEPARTMENT_OTHER): Admitting: Internal Medicine

## 2023-07-11 ENCOUNTER — Encounter (HOSPITAL_BASED_OUTPATIENT_CLINIC_OR_DEPARTMENT_OTHER): Attending: Internal Medicine | Admitting: Internal Medicine

## 2023-07-11 ENCOUNTER — Ambulatory Visit (HOSPITAL_BASED_OUTPATIENT_CLINIC_OR_DEPARTMENT_OTHER): Admitting: Internal Medicine

## 2023-07-11 DIAGNOSIS — I70235 Atherosclerosis of native arteries of right leg with ulceration of other part of foot: Secondary | ICD-10-CM | POA: Diagnosis not present

## 2023-07-11 DIAGNOSIS — M86371 Chronic multifocal osteomyelitis, right ankle and foot: Secondary | ICD-10-CM | POA: Diagnosis not present

## 2023-07-11 DIAGNOSIS — L97514 Non-pressure chronic ulcer of other part of right foot with necrosis of bone: Secondary | ICD-10-CM | POA: Diagnosis not present

## 2023-07-11 DIAGNOSIS — E11621 Type 2 diabetes mellitus with foot ulcer: Secondary | ICD-10-CM | POA: Diagnosis present

## 2023-07-11 LAB — GLUCOSE, CAPILLARY
Glucose-Capillary: 205 mg/dL — ABNORMAL HIGH (ref 70–99)
Glucose-Capillary: 174 mg/dL — ABNORMAL HIGH (ref 70–99)

## 2023-07-12 ENCOUNTER — Encounter (HOSPITAL_BASED_OUTPATIENT_CLINIC_OR_DEPARTMENT_OTHER): Admitting: Internal Medicine

## 2023-07-12 DIAGNOSIS — E11621 Type 2 diabetes mellitus with foot ulcer: Secondary | ICD-10-CM

## 2023-07-12 DIAGNOSIS — I70235 Atherosclerosis of native arteries of right leg with ulceration of other part of foot: Secondary | ICD-10-CM

## 2023-07-12 DIAGNOSIS — L97514 Non-pressure chronic ulcer of other part of right foot with necrosis of bone: Secondary | ICD-10-CM

## 2023-07-12 DIAGNOSIS — M86371 Chronic multifocal osteomyelitis, right ankle and foot: Secondary | ICD-10-CM

## 2023-07-12 LAB — GLUCOSE, CAPILLARY
Glucose-Capillary: 258 mg/dL — ABNORMAL HIGH (ref 70–99)
Glucose-Capillary: 181 mg/dL — ABNORMAL HIGH (ref 70–99)

## 2023-07-13 ENCOUNTER — Encounter (HOSPITAL_BASED_OUTPATIENT_CLINIC_OR_DEPARTMENT_OTHER): Admitting: Internal Medicine

## 2023-07-13 DIAGNOSIS — L97514 Non-pressure chronic ulcer of other part of right foot with necrosis of bone: Secondary | ICD-10-CM

## 2023-07-13 DIAGNOSIS — E11621 Type 2 diabetes mellitus with foot ulcer: Secondary | ICD-10-CM | POA: Diagnosis not present

## 2023-07-13 DIAGNOSIS — M86371 Chronic multifocal osteomyelitis, right ankle and foot: Secondary | ICD-10-CM | POA: Diagnosis not present

## 2023-07-13 LAB — GLUCOSE, CAPILLARY
Glucose-Capillary: 196 mg/dL — ABNORMAL HIGH (ref 70–99)
Glucose-Capillary: 180 mg/dL — ABNORMAL HIGH (ref 70–99)

## 2023-07-14 ENCOUNTER — Encounter (HOSPITAL_BASED_OUTPATIENT_CLINIC_OR_DEPARTMENT_OTHER): Admitting: Internal Medicine

## 2023-07-14 DIAGNOSIS — E11621 Type 2 diabetes mellitus with foot ulcer: Secondary | ICD-10-CM

## 2023-07-14 DIAGNOSIS — M86371 Chronic multifocal osteomyelitis, right ankle and foot: Secondary | ICD-10-CM

## 2023-07-14 DIAGNOSIS — L97514 Non-pressure chronic ulcer of other part of right foot with necrosis of bone: Secondary | ICD-10-CM

## 2023-07-14 LAB — GLUCOSE, CAPILLARY
Glucose-Capillary: 228 mg/dL — ABNORMAL HIGH (ref 70–99)
Glucose-Capillary: 220 mg/dL — ABNORMAL HIGH (ref 70–99)

## 2023-07-17 ENCOUNTER — Encounter (HOSPITAL_BASED_OUTPATIENT_CLINIC_OR_DEPARTMENT_OTHER): Admitting: Internal Medicine

## 2023-07-17 DIAGNOSIS — E11621 Type 2 diabetes mellitus with foot ulcer: Secondary | ICD-10-CM

## 2023-07-17 DIAGNOSIS — L97514 Non-pressure chronic ulcer of other part of right foot with necrosis of bone: Secondary | ICD-10-CM | POA: Diagnosis not present

## 2023-07-17 DIAGNOSIS — M86371 Chronic multifocal osteomyelitis, right ankle and foot: Secondary | ICD-10-CM | POA: Diagnosis not present

## 2023-07-17 LAB — GLUCOSE, CAPILLARY
Glucose-Capillary: 188 mg/dL — ABNORMAL HIGH (ref 70–99)
Glucose-Capillary: 185 mg/dL — ABNORMAL HIGH (ref 70–99)

## 2023-07-18 ENCOUNTER — Encounter (HOSPITAL_BASED_OUTPATIENT_CLINIC_OR_DEPARTMENT_OTHER): Admitting: Internal Medicine

## 2023-07-18 ENCOUNTER — Other Ambulatory Visit: Payer: Self-pay | Admitting: Student

## 2023-07-18 DIAGNOSIS — M86371 Chronic multifocal osteomyelitis, right ankle and foot: Secondary | ICD-10-CM | POA: Diagnosis not present

## 2023-07-18 DIAGNOSIS — I70235 Atherosclerosis of native arteries of right leg with ulceration of other part of foot: Secondary | ICD-10-CM | POA: Diagnosis not present

## 2023-07-18 DIAGNOSIS — E11621 Type 2 diabetes mellitus with foot ulcer: Secondary | ICD-10-CM | POA: Diagnosis not present

## 2023-07-18 DIAGNOSIS — L97514 Non-pressure chronic ulcer of other part of right foot with necrosis of bone: Secondary | ICD-10-CM

## 2023-07-18 LAB — GLUCOSE, CAPILLARY
Glucose-Capillary: 196 mg/dL — ABNORMAL HIGH (ref 70–99)
Glucose-Capillary: 132 mg/dL — ABNORMAL HIGH (ref 70–99)

## 2023-07-19 ENCOUNTER — Encounter (HOSPITAL_BASED_OUTPATIENT_CLINIC_OR_DEPARTMENT_OTHER): Admitting: Internal Medicine

## 2023-07-28 ENCOUNTER — Ambulatory Visit: Payer: Self-pay | Admitting: Student

## 2023-07-28 ENCOUNTER — Ambulatory Visit (INDEPENDENT_AMBULATORY_CARE_PROVIDER_SITE_OTHER): Admitting: Student

## 2023-07-28 ENCOUNTER — Encounter: Payer: Self-pay | Admitting: Student

## 2023-07-28 VITALS — BP 163/64 | HR 84 | Ht 71.0 in | Wt 178.6 lb

## 2023-07-28 DIAGNOSIS — J302 Other seasonal allergic rhinitis: Secondary | ICD-10-CM

## 2023-07-28 DIAGNOSIS — Z711 Person with feared health complaint in whom no diagnosis is made: Secondary | ICD-10-CM | POA: Insufficient documentation

## 2023-07-28 DIAGNOSIS — E1165 Type 2 diabetes mellitus with hyperglycemia: Secondary | ICD-10-CM

## 2023-07-28 LAB — POCT GLYCOSYLATED HEMOGLOBIN (HGB A1C): HbA1c, POC (controlled diabetic range): 6.7 % (ref 0.0–7.0)

## 2023-07-28 MED ORDER — APIXABAN 2.5 MG PO TABS: 2.5000 mg | ORAL_TABLET | Freq: Two times a day (BID) | ORAL | 3 refills | Status: DC

## 2023-07-28 MED ORDER — ATORVASTATIN CALCIUM 40 MG PO TABS: 40.0000 mg | ORAL_TABLET | Freq: Every day | ORAL | 3 refills | Status: AC

## 2023-07-28 MED ORDER — LEVOCETIRIZINE DIHYDROCHLORIDE 5 MG PO TABS: 5.0000 mg | ORAL_TABLET | Freq: Two times a day (BID) | ORAL | 3 refills | Status: AC

## 2023-07-28 NOTE — Assessment & Plan Note (Addendum)
 Patient comes in for follow-up of his diabetes.  Patient reports good compliance with medication.  Patient's A1c is 6.7, was 6.3 at last check, patient at goal.  Will continue to monitor. - Continue Jardiance  25 mg daily - Follow-up 3 months

## 2023-07-28 NOTE — Patient Instructions (Signed)
 It was great to see you! Thank you for allowing me to participate in your care!  I recommend that you always bring your medications to each appointment as this makes it easy to ensure we are on the correct medications and helps us  not miss when refills are needed.  Our plans for today:  Happy Belated Birthday!!  - Diabetes  Continue Jardiance  25 mg daily -   We are checking some labs today, I will call you if they are abnormal will send you a MyChart message or a letter if they are normal.  If you do not hear about your labs in the next 2 weeks please let us  know.  Take care and seek immediate care sooner if you develop any concerns.   Dr. Wilhemena Harbour, MD [facility]

## 2023-07-28 NOTE — Progress Notes (Signed)
  SUBJECTIVE:   CHIEF COMPLAINT / HPI:   Memory Loss He describes his memory as 'getting a little funny,' with intermittent recall of events. He maintains a routine to prevent forgetting medications or getting lost, but distractions can lead to forgetting intended actions or speech. Forgetting names has been a longstanding issue, but remembers people well. Has not been getting lost with directions.   DM2 He is currently taking Jardiance  for diabetes management, with a recent A1c of 6.7. He is interested in learning more about diabetes, especially following the recent death of his son, who had diabetes and complications after a hip operation.  PERTINENT  PMH / PSH:   OBJECTIVE:  BP (!) 163/64   Pulse 84   Ht 5\' 11"  (1.803 m)   Wt 178 lb 9.6 oz (81 kg)   SpO2 99%   BMI 24.91 kg/m  Physical Exam Constitutional:      General: He is not in acute distress.    Appearance: Normal appearance. He is not ill-appearing.  Cardiovascular:     Rate and Rhythm: Normal rate and regular rhythm.     Pulses: Normal pulses.     Heart sounds: Normal heart sounds. No murmur heard.    No friction rub. No gallop.  Pulmonary:     Effort: Pulmonary effort is normal. No respiratory distress.     Breath sounds: Normal breath sounds. No stridor. No wheezing, rhonchi or rales.  Abdominal:     General: There is no distension.     Palpations: Abdomen is soft. There is no mass.     Tenderness: There is no abdominal tenderness. There is no guarding or rebound.     Hernia: No hernia is present.  Neurological:     Mental Status: He is alert.  Psychiatric:        Mood and Affect: Mood normal.        Behavior: Behavior normal.      ASSESSMENT/PLAN:   Assessment & Plan Type 2 diabetes mellitus with hyperglycemia, without long-term current use of insulin  [facility]) Patient comes in for follow-up of his diabetes.  Patient reports good compliance with medication.  Patient's A1c is 6.7, was 6.3 at last check,  patient at goal.  Will continue to monitor. - Continue Jardiance  25 mg daily - Follow-up 3 months Concern about memory Patient comes in for concern about memory loss.  Patient reports issue with remembering when he wants to sing, sometimes get distracted and forgetting what he wants to say.  Patient has good memory for ADLs, for people, and locations.  He performed a mini cog today, remembered 2 out of 3 objects, and drew the clock correctly.  Suspect low concern for dementia/true memory deficit, suspect patient suffering from age-related memory decline.  Will continue to monitor. - Follow-up as needed No follow-ups on file. Wilhemena Harbour, MD 07/28/2023, 4:50 PM PGY-3, [facility]

## 2023-07-28 NOTE — Assessment & Plan Note (Addendum)
 Patient comes in for concern about memory loss.  Patient reports issue with remembering when he wants to sing, sometimes get distracted and forgetting what he wants to say.  Patient has good memory for ADLs, for people, and locations.  He performed a mini cog today, remembered 2 out of 3 objects, and drew the clock correctly.  Suspect low concern for dementia/true memory deficit, suspect patient suffering from age-related memory decline.  Will continue to monitor. - Follow-up as needed

## 2023-08-02 ENCOUNTER — Encounter (HOSPITAL_BASED_OUTPATIENT_CLINIC_OR_DEPARTMENT_OTHER): Admitting: Internal Medicine

## 2023-08-02 DIAGNOSIS — L97514 Non-pressure chronic ulcer of other part of right foot with necrosis of bone: Secondary | ICD-10-CM | POA: Diagnosis not present

## 2023-08-02 DIAGNOSIS — E11621 Type 2 diabetes mellitus with foot ulcer: Secondary | ICD-10-CM | POA: Diagnosis not present

## 2023-08-02 DIAGNOSIS — M86371 Chronic multifocal osteomyelitis, right ankle and foot: Secondary | ICD-10-CM

## 2023-08-02 DIAGNOSIS — I70235 Atherosclerosis of native arteries of right leg with ulceration of other part of foot: Secondary | ICD-10-CM | POA: Diagnosis not present

## 2023-08-12 ENCOUNTER — Other Ambulatory Visit: Payer: Self-pay | Admitting: Student

## 2023-08-12 DIAGNOSIS — E1165 Type 2 diabetes mellitus with hyperglycemia: Secondary | ICD-10-CM

## 2023-08-15 ENCOUNTER — Ambulatory Visit: Admitting: Student

## 2023-08-15 VITALS — BP 118/74 | HR 85 | Wt 180.0 lb

## 2023-08-15 DIAGNOSIS — E1142 Type 2 diabetes mellitus with diabetic polyneuropathy: Secondary | ICD-10-CM | POA: Diagnosis not present

## 2023-08-15 DIAGNOSIS — R051 Acute cough: Secondary | ICD-10-CM | POA: Diagnosis not present

## 2023-08-15 MED ORDER — DULOXETINE HCL 60 MG PO CPEP: 60.0000 mg | ORAL_CAPSULE | Freq: Every day | ORAL | 3 refills | Status: DC

## 2023-08-15 MED ORDER — FAMOTIDINE 20 MG PO TABS: 20.0000 mg | ORAL_TABLET | Freq: Two times a day (BID) | ORAL | 1 refills | Status: DC

## 2023-08-15 NOTE — Progress Notes (Signed)
  SUBJECTIVE:   CHIEF COMPLAINT / HPI:   Burning Feet / Neuropathy  Burning pain in both feet has been present for the past two to three days, described as a burning sensation on the top of the feet, exacerbated by standing or walking, and relieved by lying down. They have a history of gout, previously presenting with similar symptoms, but have been on allopurinol  for many years, with the dose recently reduced from 300 mg to 100 mg. Additionally, they have a history of neuropathy and have been taking gabapentin  for years, with a regimen of two pills at night and one in the morning, but are unsure of its effectiveness.  Persistent cough A persistent cough has been present for the past couple of weeks, described as a hard cough without productive sputum. No fever, body aches, chills, nasal congestion, or runny nose are reported. They note a 'little tickle' in the throat and have noticed increased gas and belching recently, along with occasional sour or burning taste in the mouth. Their gallbladder has been removed.  PERTINENT  PMH / PSH:   OBJECTIVE:  BP 118/74   Pulse 85   Wt 180 lb (81.6 kg)   SpO2 95%   BMI 25.10 kg/m  Physical Exam Cardiovascular:     Pulses:          Dorsalis pedis pulses are 1+ on the right side and 1+ on the left side.  Feet:     Right foot:     Skin integrity: No ulcer, blister, skin breakdown, erythema, warmth, callus, dry skin or fissure.     Left foot:     Skin integrity: No ulcer, blister, skin breakdown, erythema, warmth, callus, dry skin or fissure.      ASSESSMENT/PLAN:   Assessment & Plan Type 2 diabetes mellitus with peripheral neuropathy [place]) Patient comes in for concern of burning sensation in feet has been more of an issue over the last few days, but has been an issue for years.  Patient takes gabapentin  900 mg a day, without relief.  Will need to check kidney function, recommend SNRI to help with neuropathy.  Will also test labs for other  causes of neuropathy, the patient has history of neuropathy, syncope previously poorly controlled. - BMP, B12, RPR, HIV, TSH - Cymbalta  60 mg daily Acute cough Patient comes in with complaint of cough, this been an issue for the last 2 to 3 weeks.  Patient denies any wheezing or shortness of breath.  Patient not using his albuterol  but every so often weeks apart.  Patient does not smoke patient reports no symptoms of runny nose, congestion.  Patient appreciates bitter taste in his mouth that sometimes, and feeling of reflux in his chest, most concerning for GERD, will start medication to treat this see if this is because of his cough.  Will also obtain chest x-ray, though low concern for infection given lack lack of systemic symptoms. - Pepcid 20 mg twice daily - Chest x-ray - Follow-up 3 to 6 weeks No follow-ups on file. Wilhemena Harbour, MD 08/15/2023, 3:43 PM PGY-3, [facility]

## 2023-08-15 NOTE — Assessment & Plan Note (Addendum)
 Patient comes in with complaint of cough, this been an issue for the last 2 to 3 weeks.  Patient denies any wheezing or shortness of breath.  Patient not using his albuterol  but every so often weeks apart.  Patient does not smoke patient reports no symptoms of runny nose, congestion.  Patient appreciates bitter taste in his mouth that sometimes, and feeling of reflux in his chest, most concerning for GERD, will start medication to treat this see if this is because of his cough.  Will also obtain chest x-ray, though low concern for infection given lack lack of systemic symptoms. - Pepcid 20 mg twice daily - Chest x-ray - Follow-up 3 to 6 weeks

## 2023-08-15 NOTE — Patient Instructions (Signed)
 It was great to see you! Thank you for allowing me to participate in your care!  I recommend that you always bring your medications to each appointment as this makes it easy to ensure we are on the correct medications and helps us  not miss when refills are needed.  Our plans for today:  - Cough  I think your cough may be coming from reflux. We are going to start a medication that should help with this, and decrease your cough. Try the medicine for 6 weeks and let me know if cough continues.    Start: Pepcid 20 mg twice a day, for 6 weeks  - Neuropathy  I think your burning foot pain is coming from neuropathy. We will get some blood work to test for causes of neuropathy, but this may be coming from your diabetes.   -Testing blood for causes (Syphilis, HIV, B12, TSH)    -Start: Cymbalta  60 mg daily  We are checking some labs today, I will call you if they are abnormal will send you a MyChart message or a letter if they are normal.  If you do not hear about your labs in the next 2 weeks please let us  know.  Take care and seek immediate care sooner if you develop any concerns.   Dr. Wilhemena Harbour, MD [facility]

## 2023-08-15 NOTE — Assessment & Plan Note (Addendum)
 Patient comes in for concern of burning sensation in feet has been more of an issue over the last few days, but has been an issue for years.  Patient takes gabapentin  900 mg a day, without relief.  Will need to check kidney function, recommend SNRI to help with neuropathy.  Will also test labs for other causes of neuropathy, the patient has history of neuropathy, syncope previously poorly controlled. - BMP, B12, RPR, HIV, TSH - Cymbalta  60 mg daily

## 2023-08-16 ENCOUNTER — Other Ambulatory Visit

## 2023-08-17 ENCOUNTER — Other Ambulatory Visit

## 2023-08-17 DIAGNOSIS — E1142 Type 2 diabetes mellitus with diabetic polyneuropathy: Secondary | ICD-10-CM

## 2023-08-21 LAB — HIV ANTIBODY (ROUTINE TESTING W REFLEX): HIV Screen 4th Generation wRfx: NONREACTIVE

## 2023-08-21 LAB — TSH RFX ON ABNORMAL TO FREE T4: TSH: 2.21 u[IU]/mL (ref 0.450–4.500)

## 2023-08-21 LAB — BASIC METABOLIC PANEL WITH GFR
BUN/Creatinine Ratio: 17 (ref 10–24)
BUN: 28 mg/dL — ABNORMAL HIGH (ref 8–27)
Calcium: 8.9 mg/dL (ref 8.6–10.2)
Chloride: 109 mmol/L — ABNORMAL HIGH (ref 96–106)
Creatinine, Ser: 1.64 mg/dL — ABNORMAL HIGH (ref 0.76–1.27)
Glucose: 201 mg/dL — ABNORMAL HIGH (ref 70–99)
Potassium: 4.6 mmol/L (ref 3.5–5.2)
Sodium: 140 mmol/L (ref 134–144)
eGFR: 40 mL/min/{1.73_m2} — ABNORMAL LOW (ref >59)

## 2023-08-21 LAB — RPR: RPR Ser Ql: NONREACTIVE

## 2023-08-22 ENCOUNTER — Other Ambulatory Visit: Payer: Self-pay | Admitting: Student

## 2023-08-24 ENCOUNTER — Other Ambulatory Visit: Payer: Self-pay | Admitting: Student

## 2023-08-24 DIAGNOSIS — L309 Dermatitis, unspecified: Secondary | ICD-10-CM

## 2023-08-25 ENCOUNTER — Ambulatory Visit: Payer: Self-pay | Admitting: Student

## 2023-08-29 ENCOUNTER — Telehealth: Payer: Self-pay

## 2023-08-29 NOTE — Telephone Encounter (Signed)
 Received call from [place] [place] [facility] regarding patient.   She reports two findings from visit yesterday.   She states that patient reported that he was recently started on Pepcid  and Duloxetine . Patient felt that after taking medication he was having issues with constipation and stopped taking medications.  Patient was reporting slight issues with feeling off balance. Patient does have history of toe amputations on right foot.   Called patient to discuss concerns further.   Patient reports that he did stop medications due to constipation. He states that he last had BM this morning. Denies blood in stool. He states that cough has also resolved since last visit.   He reports that issues with balance have been present for several months/year. Palliative care nurse had mentioned possible referral for PT, however, patient declines. He states that he is going to start going to the [facility] again.   Advised patient of when to schedule follow up visit. He will call us  back if he has any concerns.   Chiquita JAYSON English, RN

## 2023-08-31 ENCOUNTER — Encounter: Payer: Self-pay | Admitting: Podiatry

## 2023-08-31 ENCOUNTER — Ambulatory Visit: Payer: PPO | Admitting: Podiatry

## 2023-08-31 DIAGNOSIS — E1151 Type 2 diabetes mellitus with diabetic peripheral angiopathy without gangrene: Secondary | ICD-10-CM | POA: Diagnosis not present

## 2023-08-31 DIAGNOSIS — M79674 Pain in right toe(s): Secondary | ICD-10-CM

## 2023-08-31 DIAGNOSIS — B351 Tinea unguium: Secondary | ICD-10-CM

## 2023-08-31 DIAGNOSIS — Z89411 Acquired absence of right great toe: Secondary | ICD-10-CM

## 2023-08-31 DIAGNOSIS — M79675 Pain in left toe(s): Secondary | ICD-10-CM

## 2023-08-31 NOTE — Progress Notes (Signed)
 This patient returns to my office for at risk foot care.  This patient requires this care by a professional since this patient will be at risk due to having diabetes and amputation 1,2 ,3 right toes.  This patient is unable to cut nails himself since the patient cannot reach his nails.These nails are painful walking and wearing shoes.  This patient presents for at risk foot care today.  General Appearance  Alert, conversant and in no acute stress.  Vascular  Dorsalis pedis and posterior tibial  pulses are  weakly palpable  bilaterally.  Capillary return is within normal limits  bilaterally. Temperature is within normal limits  bilaterally.  Neurologic  Senn-Weinstein monofilament wire test within normal limits  bilaterally. Muscle power within normal limits bilaterally.  Nails Thick disfigured discolored nails with subungual debris  from hallux to fifth toes left and 4-5 right foot. No evidence of bacterial infection or drainage bilaterally.  Orthopedic  No limitations of motion  feet .  No crepitus or effusions noted.  No bony pathology or digital deformities noted.  Amputation 1,2 , 3 right toes.  Skin  normotropic skin with no porokeratosis noted bilaterally.  No signs of infections or ulcers noted.   Callus dorsolateral aspect right foot midshaft 5th.  Onychomycosis  Pain in right toes  Pain in left toes Callus fight foot.  Consent was obtained for treatment procedures.   Mechanical debridement of nails 1-5  bilaterally performed with a nail nipper.  Filed with dremel without incident.    Return office visit    4 months                  Told patient to return for periodic foot care and evaluation due to potential at risk complications.   Cordella Bold DPM

## 2023-09-06 ENCOUNTER — Other Ambulatory Visit: Payer: Self-pay | Admitting: Student

## 2023-09-27 ENCOUNTER — Ambulatory Visit: Admitting: Student

## 2023-09-27 ENCOUNTER — Encounter: Payer: Self-pay | Admitting: Student

## 2023-09-27 VITALS — BP 143/64 | HR 83 | Ht 71.0 in | Wt 178.8 lb

## 2023-09-27 DIAGNOSIS — T50905A Adverse effect of unspecified drugs, medicaments and biological substances, initial encounter: Secondary | ICD-10-CM

## 2023-09-27 DIAGNOSIS — K5900 Constipation, unspecified: Secondary | ICD-10-CM | POA: Diagnosis not present

## 2023-09-27 NOTE — Assessment & Plan Note (Addendum)
 Patient comes in with complaint of constipation, secondary to what he believes is famotidine .  Patient noted to have history of constipation, previously on MiraLAX .  Patient stopped MiraLAX  believing it was not helping, but currently takes biscadoyl  Patient noting he has a bowel movement every 3 days or so.  Have discussed restarting MiraLAX , with biscadoyl. Will recommend patient titrate MiraLAX  to bowel movements.  Follow-up as needed. - Continue Biscadoyl - Continue MiraLAX  -Follow-up as needed

## 2023-09-27 NOTE — Progress Notes (Signed)
  SUBJECTIVE:   CHIEF COMPLAINT / HPI:   Medication Issues Patient was appreciating increased urination and sleepiness with the duloxetine , and felt like the famotidine  was causing him to be constipated. He stopped taking the duloxetine  and stopped having to urinate as frequently. He denies any dysuria, fevers or systemic symptoms.   Constipation Is taking bisacodyl  for constipation. Was taking miralax  but felt like it wasn't working. Has BM every 3 days or so.  PERTINENT  PMH / PSH:   OBJECTIVE:  BP (!) 143/64   Pulse 83   Ht 5' 11 (1.803 m)   Wt 178 lb 12.8 oz (81.1 kg)   SpO2 98%   BMI 24.94 kg/m  Physical Exam Constitutional:      General: He is not in acute distress.    Appearance: Normal appearance. He is not ill-appearing.  Pulmonary:     Effort: Pulmonary effort is normal.  Neurological:     Mental Status: He is alert.  Psychiatric:        Mood and Affect: Mood normal.        Behavior: Behavior normal.      ASSESSMENT/PLAN:   Assessment & Plan Adverse effect of drug, initial encounter Patient comes in with concern that his duloxetine  was causing him to urinate frequently.  Patient stopped duloxetine , stopped having frequent urination.  Patient denies any dysuria, itching, fevers, or systemic symptoms.  Patient reports urinary symptoms have resolved back to normal since stopping medication.  Discussed stopping medication means his neuropathy may flare, but patient okay with this.  Patient also wanting to stop famotidine , for concern of constipation. Constipation, unspecified constipation type Patient comes in with complaint of constipation, secondary to what he believes is famotidine .  Patient noted to have history of constipation, previously on MiraLAX .  Patient stopped MiraLAX  believing it was not helping, but currently takes biscadoyl  Patient noting he has a bowel movement every 3 days or so.  Have discussed restarting MiraLAX , with biscadoyl. Will recommend  patient titrate MiraLAX  to bowel movements.  Follow-up as needed. - Continue Biscadoyl - Continue MiraLAX  -Follow-up as needed No follow-ups on file. Penne Rhein, MD 09/27/2023, 9:11 AM PGY-3, [facility]

## 2023-09-27 NOTE — Patient Instructions (Signed)
 It was great to see you! Thank you for allowing me to participate in your care!  I recommend that you always bring your medications to each appointment as this makes it easy to ensure we are on the correct medications and helps us  not miss when refills are needed.  Our plans for today:  - Medication issues I'm sorry to hear that the duloxitie was causing you to have to urinate. It was a good medicine to treat your neuropathy, but if you prefer not to be on it, that is fine.   Constipation   Take biscadoyl daily with miralax  daily   If no bowel movement after 4-5 days, take miralax  in the morning and evening  Decrease miralax  use to once a day, or every other day, if needed, based on stool consistency. If having watery stools, decrease miralax  use.  Take care and seek immediate care sooner if you develop any concerns.   Dr. Penne Rhein, MD [facility]

## 2023-10-20 ENCOUNTER — Other Ambulatory Visit: Payer: Self-pay | Admitting: *Deleted

## 2023-10-20 DIAGNOSIS — M170 Bilateral primary osteoarthritis of knee: Secondary | ICD-10-CM

## 2023-10-20 MED ORDER — DICLOFENAC SODIUM 1 % EX GEL
CUTANEOUS | 3 refills | Status: AC
Start: 2023-10-20 — End: ?

## 2023-10-23 ENCOUNTER — Other Ambulatory Visit: Payer: Self-pay

## 2023-10-23 DIAGNOSIS — E1165 Type 2 diabetes mellitus with hyperglycemia: Secondary | ICD-10-CM

## 2023-10-23 NOTE — Telephone Encounter (Signed)
 Patient came in needing a refill on Gabapentin , he has been out of it for 2 days. Thank you!

## 2023-10-24 ENCOUNTER — Other Ambulatory Visit: Payer: Self-pay | Admitting: *Deleted

## 2023-10-24 MED ORDER — GABAPENTIN 300 MG PO CAPS
ORAL_CAPSULE | ORAL | 0 refills | Status: DC
Start: 2023-10-24 — End: 2023-11-21

## 2023-10-24 MED ORDER — SILDENAFIL CITRATE 25 MG PO TABS: 25.0000 mg | ORAL_TABLET | ORAL | 0 refills | Status: AC | PRN

## 2023-11-09 ENCOUNTER — Other Ambulatory Visit: Payer: Self-pay

## 2023-11-09 DIAGNOSIS — Z794 Long term (current) use of insulin: Secondary | ICD-10-CM

## 2023-11-10 ENCOUNTER — Other Ambulatory Visit: Payer: Self-pay

## 2023-11-10 DIAGNOSIS — J449 Chronic obstructive pulmonary disease, unspecified: Secondary | ICD-10-CM

## 2023-11-11 MED ORDER — FLUTICASONE-SALMETEROL 250-50 MCG/ACT IN AEPB: 2.0000 | INHALATION_SPRAY | Freq: Two times a day (BID) | RESPIRATORY_TRACT | 2 refills | Status: DC

## 2023-11-16 ENCOUNTER — Ambulatory Visit

## 2023-11-16 VITALS — Ht 71.0 in | Wt 178.0 lb

## 2023-11-16 DIAGNOSIS — Z0000 Encounter for general adult medical examination without abnormal findings: Secondary | ICD-10-CM

## 2023-11-16 NOTE — Progress Notes (Addendum)
 Because this visit was a virtual/telehealth visit,  certain criteria was not obtained, such a blood pressure, CBG if applicable, and timed get up and go. Any medications not marked as taking were not mentioned during the medication reconciliation part of the visit. Any vitals not documented were not able to be obtained due to this being a telehealth visit or patient was unable to self-report a recent blood pressure reading due to a lack of equipment at home via telehealth. Vitals that have been documented are verbally provided by the patient.   Subjective:   [name] is a 89 y.o. who presents for a Medicare Wellness preventive visit.  As a reminder, Annual Wellness Visits don't include a physical exam, and some assessments may be limited, especially if this visit is performed virtually. We may recommend an in-person follow-up visit with your provider if needed.  Visit Complete: Virtual I connected with  [name] on 11/16/23 by a audio enabled telemedicine application and verified that I am speaking with the correct person using two identifiers.  Patient Location: Home  Provider Location: Office/Clinic  I discussed the limitations of evaluation and management by telemedicine. The patient expressed understanding and agreed to proceed.  Vital Signs: Because this visit was a virtual/telehealth visit, some criteria may be missing or patient reported. Any vitals not documented were not able to be obtained and vitals that have been documented are patient reported.  VideoDeclined- This patient declined Librarian, academic. Therefore the visit was completed with audio only.  Persons Participating in Visit: Patient.  AWV Questionnaire: No: Patient Medicare AWV questionnaire was not completed prior to this visit.  Cardiac Risk Factors include: advanced age (>80men, >34 women);diabetes mellitus;hypertension;male gender;sedentary lifestyle;family history  of premature cardiovascular disease     Objective:    Today's Vitals   11/16/23 1646  Weight: 178 lb (80.7 kg)  Height: 5' 11 (1.803 m)  PainSc: 0-No pain   Body mass index is 24.83 kg/m.     11/16/2023    4:50 PM 09/27/2023    9:00 AM 08/15/2023    3:37 PM 07/28/2023    4:05 PM 06/01/2023   10:56 AM 10/10/2022   10:11 AM 09/14/2022   11:06 AM  Advanced Directives  Does Patient Have a Medical Advance Directive? Yes No Yes No No Yes No  Type of Estate agent of [place];Living will Healthcare Power of [place];Living will Healthcare Power of [place];Living will   Living will   Does patient want to make changes to medical advance directive?   No - Patient declined   No - Guardian declined   Copy of Healthcare Power of Attorney in Chart? No - copy requested Yes - validated most recent copy scanned in chart (See row information) Yes - validated most recent copy scanned in chart (See row information)      Would patient like information on creating a medical advance directive? No - Patient declined No - Patient declined No - Patient declined No - Patient declined  No - Patient declined No - Patient declined    Current Medications (verified) Outpatient Encounter Medications as of 11/16/2023  Medication Sig   albuterol  (VENTOLIN  HFA) 108 (90 Base) MCG/ACT inhaler Inhale 2 puffs into the lungs every 6 (six) hours as needed for wheezing or shortness of breath.   allopurinol  (ZYLOPRIM ) 100 MG tablet TAKE 1 TABLET BY MOUTH EVERY DAY   apixaban  (ELIQUIS ) 2.5 MG TABS tablet Take 1 tablet (2.5 mg total) by mouth  2 (two) times daily.   aspirin  81 MG EC tablet Take 81 mg by mouth daily.   atorvastatin  (LIPITOR) 40 MG tablet Take 1 tablet (40 mg total) by mouth daily.   blood glucose meter kit and supplies KIT Dispense based on patient and insurance preference. Use up to four times daily as directed.   cycloSPORINE  (RESTASIS ) 0.05 % ophthalmic emulsion Place 1 drop into both eyes  2 (two) times daily. (Patient taking differently: Place 1 drop into both eyes 2 (two) times daily as needed (dry eyes).)   diclofenac  Sodium (VOLTAREN ) 1 % GEL Apply 4 times daily to affected area   DULoxetine  (CYMBALTA ) 60 MG capsule TAKE 1 CAPSULE BY MOUTH EVERY DAY   empagliflozin  (JARDIANCE ) 25 MG TABS tablet Take 1 tablet (25 mg total) by mouth daily.   famotidine  (PEPCID ) 20 MG tablet TAKE 1 TABLET BY MOUTH TWICE A DAY   fluticasone -salmeterol (WIXELA INHUB) 250-50 MCG/ACT AEPB Inhale 2 puffs into the lungs in the morning and at bedtime.   gabapentin  (NEURONTIN ) 300 MG capsule 300 mg in the morning, 600 mg at bedtime. Needs to make appointment to see PCP for AWV.   insulin  glargine (LANTUS ) 100 UNIT/ML Solostar Pen Inject 5 Units into the skin daily.   Insulin  Pen Needle 31G X 5 MM MISC Please use to administer insulin . E11.65   Lancets (ONETOUCH DELICA PLUS LANCET33G) MISC USE AS DIRECTED UP TO 4 TIMES DAILY AS DIRECTED   levocetirizine (XYZAL ) 5 MG tablet Take 1 tablet (5 mg total) by mouth 2 (two) times daily.   Multiple Vitamins-Minerals (MULTIVITAMIN WITH MINERALS) tablet Take 1 tablet by mouth daily.   ONETOUCH VERIO test strip TEST UP TO 4 TIMES DAILY AS DIRECTED   sildenafil  (VIAGRA ) 25 MG tablet Take 1 tablet (25 mg total) by mouth as needed for erectile dysfunction. Patient needs to make appointment to see PCP.   sodium bicarbonate  650 MG tablet Take 1 tablet (650 mg total) by mouth 2 (two) times daily.   tiotropium (SPIRIVA ) 18 MCG inhalation capsule INHALE 1 CAPSULE VIA HANDIHALER ONCE DAILY AT THE SAME TIME EVERY DAY   triamcinolone  cream (KENALOG ) 0.1 % APPLY TOPICALLY AS NEEDED FOR SKIN ITCHING   [DISCONTINUED] atorvastatin  (LIPITOR) 20 MG tablet TAKE 1 TABLET BY MOUTH EVERY DAY (Patient not taking: Reported on 07/07/2021)   No facility-administered encounter medications on file as of 11/16/2023.    Allergies (verified) Patient has no known allergies.   History: Past  Medical History:  Diagnosis Date   ANEMIA, IRON DEF, NOS 01/15/2007   Qualifier: Diagnosis of  By: Chandra MD, Matthew     Arthritis    Asthma    Cataract extraction status of left eye 2016   Cataract extraction status of right eye 2016   CHOLECYSTECTOMY, LAPAROSCOPIC, HX OF 04/12/2007   Qualifier: Diagnosis of  By: Alla  MD, Alda     COPD (chronic obstructive pulmonary disease) (HCC)    Critical lower limb ischemia (HCC) 10/02/2018   Diabetes mellitus without complication (HCC)    Gout 2007   Hypertension    Neuromuscular disorder (HCC)    Neuropathy   Neuropathy    bilateral feet   Pneumonia    Toe ulcer (HCC) 09/20/2018   Past Surgical History:  Procedure Laterality Date   ABDOMINAL AORTOGRAM W/LOWER EXTREMITY N/A 10/03/2018   Procedure: ABDOMINAL AORTOGRAM W/LOWER EXTREMITY;  Surgeon: Gretta Lonni PARAS, MD;  Location: [facility];  Service: Cardiovascular;  Laterality: N/A;  Bilateral  ABDOMINAL AORTOGRAM W/LOWER EXTREMITY N/A 01/21/2022   Procedure: ABDOMINAL AORTOGRAM W/LOWER EXTREMITY;  Surgeon: Magda Debby SAILOR, MD;  Location: [facility];  Service: Cardiovascular;  Laterality: N/A;   AMPUTATION TOE Right 02/03/2022   Procedure: RIGHT THIRD TOE AMPUTATION;  Surgeon: Magda Debby SAILOR, MD;  Location[facility];  Service: Vascular;  Laterality: Right;   CHOLECYSTECTOMY     FEMORAL-TIBIAL BYPASS GRAFT Right 02/03/2022   Procedure: RIGHT COMMON FEMORAL TO PEDAL ARTERY BYPASS WITH 6CM POLYTETRAFLUOROETHYLENE GRAFT;  Surgeon: Magda Debby SAILOR, MD;  Location: [facility] OR;  Service: Vascular;  Laterality: Right;   toe ampuation Right    Family History  Problem Relation Age of Onset   Asthma Mother    Arthritis Sister    Alcohol abuse Sister    Cirrhosis Sister    Asthma Sister    Heart disease Brother    Stroke Brother    Diabetes Brother    Cancer Brother        colon   Stroke Brother    Heart disease Brother    Arthritis Daughter    Asthma Daughter     Diabetes Son    Social History   Socioeconomic History   Marital status: Widowed    Spouse name: Not on file   Number of children: Not on file   Years of education: 10   Highest education level: 10th grade  Occupational History   Occupation: RetiredHotel manager Work  Tobacco Use   Smoking status: Former    Current packs/day: 0.00    Average packs/day: 0.5 packs/day for 60.3 years (30.1 ttl pk-yrs)    Types: Cigars, Cigarettes    Start date: 03/08/1951    Quit date: 06/08/2011    Years since quitting: 12.4    Passive exposure: Past   Smokeless tobacco: Never  Vaping Use   Vaping status: Never Used  Substance and Sexual Activity   Alcohol use: Not Currently    Comment: Hx of use   Drug use: Yes    Types: Marijuana   Sexual activity: Not Currently  Other Topics Concern   Not on file  Social History Narrative   Patient lives with his daughter and her husband.   Patients wife died in 2018/11/29.   Health Care POA:    Emergency Contact: Daughter- [name] [phone]   End of Life Plan:       Any pets: none   Diet: Patient has a varied diet and reports eating bacon and eggs every morning. Does not like most fruit. Likes flavored water, kool-aid.   No current exercise.     Seatbelts: Pt reports wearing seatbelt when in vehicle.   Hobbies: likes to watch [employer] and rest                Social Drivers of Health   Financial Resource Strain: Low Risk  (11/16/2023)   Overall Financial Resource Strain (CARDIA)    Difficulty of Paying Living Expenses: Not hard at all  Food Insecurity: No Food Insecurity (11/16/2023)   Hunger Vital Sign    Worried About Running Out of Food in the Last Year: Never true    Ran Out of Food in the Last Year: Never true  Transportation Needs: No Transportation Needs (11/16/2023)   PRAPARE - Administrator, Civil Service (Medical): No    Lack of Transportation (Non-Medical): No  Physical Activity: Inactive (11/16/2023)   Exercise Vital Sign     Days of Exercise per Week: 0  days    Minutes of Exercise per Session: 0 min  Stress: No Stress Concern Present (11/16/2023)   [employer] of Occupational Health - Occupational Stress Questionnaire    Feeling of Stress: Not at all  Social Connections: Socially Isolated (11/16/2023)   Social Connection and Isolation Panel    Frequency of Communication with Friends and Family: More than three times a week    Frequency of Social Gatherings with Friends and Family: Twice a week    Attends Religious Services: Never    Database administrator or Organizations: No    Attends Banker Meetings: Never    Marital Status: Widowed    Tobacco Counseling Counseling given: Not Answered    Clinical Intake:  Pre-visit preparation completed: Yes  Pain : No/denies pain Pain Score: 0-No pain     BMI - recorded: 24.83 Nutritional Status: BMI of 19-24  Normal Nutritional Risks: None Diabetes: Yes CBG done?: No Did pt. bring in CBG monitor from home?: No  Lab Results  Component Value Date   HGBA1C 6.7 07/28/2023   HGBA1C 6.3 04/13/2023   HGBA1C 6.7 12/13/2022     How often do you need to have someone help you when you read instructions, pamphlets, or other written materials from your doctor or pharmacy?: 1 - Never  Interpreter Needed?: No  Information entered by :: Kila Godina N. Lyndee Herbst, LPN.   Activities of Daily Living     11/16/2023    4:51 PM  In your present state of health, do you have any difficulty performing the following activities:  Hearing? 0  Vision? 0  Difficulty concentrating or making decisions? 0  Comment PROBLEM WITH NAMES  Walking or climbing stairs? 0  Dressing or bathing? 0  Doing errands, shopping? 0  Preparing Food and eating ? N  Using the Toilet? N  In the past six months, have you accidently leaked urine? Y  Do you have problems with loss of bowel control? N  Managing your Medications? N  Managing your Finances? N  Housekeeping or  managing your Housekeeping? N    Patient Care Team: Lupie Credit, DO as PCP - General (Family Medicine) Onesimo Emaline Brink, MD as Consulting Physician (Hematology) Manford Elspeth ORN, MD as Referring Physician (Optometry) [facility] as Consulting Physician (Ophthalmology)  I have updated your Care Teams any recent Medical Services you may have received from other providers in the past year.     Assessment:   This is a routine wellness examination for [name].  Hearing/Vision screen Hearing Screening - Comments:: Patient wears hearing aids. Vision Screening - Comments:: Patient wears eyeglasses.  Last opthmaologist was [facility].   Goals Addressed             This Visit's Progress    11/16/23: To live to see [id number]       Eat more fruits and vegetables         Depression Screen     09/27/2023    9:00 AM 08/15/2023    3:37 PM 07/28/2023    4:05 PM 04/13/2023   11:20 AM 09/14/2022   11:06 AM 09/09/2022    2:09 PM 09/06/2022    1:19 PM  PHQ 2/9 Scores  PHQ - 2 Score 0 0 0 0 0  3  PHQ- 9 Score 1 1 3 3 2  4   Exception Documentation      Patient refusal     Fall Risk     11/16/2023  4:50 PM 09/27/2023    9:00 AM 08/15/2023    3:37 PM 07/28/2023    4:05 PM 04/13/2023   11:36 AM  Fall Risk   Falls in the past year? 0 0 0 0 0  Number falls in past yr: 0  0 0 0  Injury with Fall? 0 0 0 0 0  Risk for fall due to : No Fall Risks No Fall Risks Impaired balance/gait    Follow up Falls evaluation completed Falls evaluation completed Falls prevention discussed      MEDICARE RISK AT HOME:  Medicare Risk at Home Any stairs in or around the home?: No If so, are there any without handrails?: No Home free of loose throw rugs in walkways, pet beds, electrical cords, etc?: Yes Adequate lighting in your home to reduce risk of falls?: Yes Life alert?: No Use of a cane, walker or w/c?: No Grab bars in the bathroom?: No Shower chair or bench in shower?:  No Elevated toilet seat or a handicapped toilet?: No  TIMED UP AND GO:  Was the test performed?  No  Cognitive Function: Declined/Normal: No cognitive concerns noted by patient or family. Patient alert, oriented, able to answer questions appropriately and recall recent events. No signs of memory loss or confusion.    11/16/2023    4:51 PM 01/01/2018    3:32 PM 12/24/2012    3:00 PM 10/11/2011    3:00 PM 09/13/2010    4:00 PM  MMSE - Mini Mental State Exam  Not completed: Unable to complete      Orientation to time  5 5  5  5    Orientation to Place  5 5  5  5    Registration  3 3  3  3    Attention/ Calculation  5 5  3  5    Recall  3 3  3  3    Language- name 2 objects  2 2  2  2    Language- repeat  1 1 1 1   Language- follow 3 step command  3 3  3  3    Language- read & follow direction  1 1  1  1    Write a sentence  1 0  1  1   Copy design  1 1  1  1    Total score  30 29  28  30       Data saved with a previous flowsheet row definition        11/16/2023    4:51 PM 09/06/2022    1:17 PM 05/25/2021    4:18 PM 01/01/2018    3:33 PM  6CIT Screen  What Year? 0 points 0 points 0 points 0 points  What month? 0 points 0 points 0 points 0 points  What time? 0 points 0 points 0 points 0 points  Count back from 20 0 points  2 points 0 points  Months in reverse 0 points 2 points 2 points 0 points  Repeat phrase 0 points 2 points 2 points 0 points  Total Score 0 points  6 points 0 points    Immunizations Immunization History  Administered Date(s) Administered   Fluad Quad(high Dose 65+) 03/11/2019, 12/20/2019, 12/09/2020, 11/29/2021   Fluad Trivalent(High Dose 65+) 12/13/2022   Influenza Split 12/29/2010, 12/06/2011   Influenza Whole 01/16/2009   Influenza,inj,Quad PF,6+ Mos 12/24/2012, 12/16/2013, 12/12/2014, 12/07/2015, 12/29/2016, 11/10/2017   Influenza-Unspecified 12/28/2000, 01/06/2003, 01/05/2004, 01/12/2005, 01/05/2006   PFIZER Comirnaty(Gray Top)Covid-19 Tri-Sucrose Vaccine  06/17/2020  PFIZER(Purple Top)SARS-COV-2 Vaccination 03/30/2019, 04/20/2019, 12/20/2019   Pfizer Covid-19 Vaccine Bivalent Booster 72yrs & up 12/09/2020   Pfizer(Comirnaty)Fall Seasonal Vaccine 12 years and older 03/22/2022, 12/13/2022   Pneumococcal Conjugate-13 10/24/2007   Pneumococcal Polysaccharide-23 09/29/2000, 07/27/2006, 07/11/2011   Tdap 07/27/2006, 07/23/2008, 01/12/2022    Screening Tests Health Maintenance  Topic Date Due   Zoster Vaccines- Shingrix (1 of 2) Never done   Colonoscopy  10/04/2018   FOOT EXAM  08/14/2022   OPHTHALMOLOGY EXAM  03/25/2023   Medicare Annual Wellness (AWV)  09/06/2023   Influenza Vaccine  10/06/2023   COVID-19 Vaccine (8 - Pfizer risk 2024-25 season) 11/06/2023   HEMOGLOBIN A1C  01/28/2024   DTaP/Tdap/Td (4 - Td or Tdap) 01/13/2032   Pneumococcal Vaccine: 50+ Years  Completed   HPV VACCINES  Aged Out   Meningococcal B Vaccine  Aged Out    Health Maintenance Items Addressed: Yes Patient is due for Diabetic Eye Exam, Foot Exam, and vaccines.  Additional Screening:  Vision Screening: Recommended annual ophthalmology exams for early detection of glaucoma and other disorders of the eye. Is the patient up to date with their annual eye exam?  No  Who is the provider or what is the name of the office in which the patient attends annual eye exams? Lamar Gaudy, MD.  Dental Screening: Recommended annual dental exams for proper oral hygiene  Community Resource Referral / Chronic Care Management: CRR required this visit?  No   CCM required this visit?  No   Plan:    I have personally reviewed and noted the following in the patient's chart:   Medical and social history Use of alcohol, tobacco or illicit drugs  Current medications and supplements including opioid prescriptions. Patient is not currently taking opioid prescriptions. Functional ability and status Nutritional status Physical activity Advanced directives List of other  physicians Hospitalizations, surgeries, and ER visits in previous 12 months Vitals Screenings to include cognitive, depression, and falls Referrals and appointments  In addition, I have reviewed and discussed with patient certain preventive protocols, quality metrics, and best practice recommendations. A written personalized care plan for preventive services as well as general preventive health recommendations were provided to patient.   Roz LOISE Fuller, LPN   [id number]   After Visit Summary: (MyChart) Due to this being a telephonic visit, the after visit summary with patients personalized plan was offered to patient via MyChart   Notes: Nothing significant to report at this time.

## 2023-11-16 NOTE — Patient Instructions (Signed)
 [name],  Thank you for taking the time for your Medicare Wellness Visit. I appreciate your continued commitment to your health goals. Please review the care plan we discussed, and feel free to reach out if I can assist you further.  Medicare recommends these wellness visits once per year to help you and your care team stay ahead of potential health issues. These visits are designed to focus on prevention, allowing your provider to concentrate on managing your acute and chronic conditions during your regular appointments.  Please note that Annual Wellness Visits do not include a physical exam. Some assessments may be limited, especially if the visit was conducted virtually. If needed, we may recommend a separate in-person follow-up with your provider.  Ongoing Care Seeing your primary care provider every 3 to 6 months helps us  monitor your health and provide consistent, personalized care.   Referrals If a referral was made during today's visit and you haven't received any updates within two weeks, please contact the referred provider directly to check on the status.  Recommended Screenings:  Health Maintenance  Topic Date Due   Zoster (Shingles) Vaccine (1 of 2) Never done   Colon Cancer Screening  10/04/2018   Complete foot exam   08/14/2022   Eye exam for diabetics  03/25/2023   Flu Shot  10/06/2023   COVID-19 Vaccine (8 - Pfizer risk 2024-25 season) 11/06/2023   Hemoglobin A1C  01/28/2024   Medicare Annual Wellness Visit  11/15/2024   DTaP/Tdap/Td vaccine (4 - Td or Tdap) 01/13/2032   Pneumococcal Vaccine for age over 30  Completed   HPV Vaccine  Aged Out   Meningitis B Vaccine  Aged Out       11/16/2023    4:50 PM  Advanced Directives  Does Patient Have a Medical Advance Directive? Yes  Type of Estate agent of [place];Living will  Copy of Healthcare Power of Attorney in Chart? No - copy requested  Would patient like information on creating a  medical advance directive? No - Patient declined   Advance Care Planning is important because it: Ensures you receive medical care that aligns with your values, goals, and preferences. Provides guidance to your family and loved ones, reducing the emotional burden of decision-making during critical moments.  Vision: Annual vision screenings are recommended for early detection of glaucoma, cataracts, and diabetic retinopathy. These exams can also reveal signs of chronic conditions such as diabetes and high blood pressure.  Dental: Annual dental screenings help detect early signs of oral cancer, gum disease, and other conditions linked to overall health, including heart disease and diabetes.  Please see the attached documents for additional preventive care recommendations.

## 2023-11-21 ENCOUNTER — Ambulatory Visit (INDEPENDENT_AMBULATORY_CARE_PROVIDER_SITE_OTHER): Admitting: Family Medicine

## 2023-11-21 ENCOUNTER — Encounter: Payer: Self-pay | Admitting: Family Medicine

## 2023-11-21 VITALS — BP 156/68 | HR 54 | Ht 71.0 in | Wt 183.6 lb

## 2023-11-21 DIAGNOSIS — I10 Essential (primary) hypertension: Secondary | ICD-10-CM | POA: Diagnosis not present

## 2023-11-21 DIAGNOSIS — E1165 Type 2 diabetes mellitus with hyperglycemia: Secondary | ICD-10-CM

## 2023-11-21 DIAGNOSIS — E1142 Type 2 diabetes mellitus with diabetic polyneuropathy: Secondary | ICD-10-CM

## 2023-11-21 DIAGNOSIS — Z23 Encounter for immunization: Secondary | ICD-10-CM

## 2023-11-21 DIAGNOSIS — Z794 Long term (current) use of insulin: Secondary | ICD-10-CM

## 2023-11-21 DIAGNOSIS — E118 Type 2 diabetes mellitus with unspecified complications: Secondary | ICD-10-CM | POA: Diagnosis not present

## 2023-11-21 LAB — POCT GLYCOSYLATED HEMOGLOBIN (HGB A1C): HbA1c, POC (controlled diabetic range): 7.1 % — AB (ref 0.0–7.0)

## 2023-11-21 MED ORDER — GABAPENTIN 300 MG PO CAPS: ORAL_CAPSULE | ORAL | 0 refills | Status: DC

## 2023-11-21 NOTE — Patient Instructions (Signed)
 Nerve Damage From Diabetes (Diabetic Neuropathy): What to Know Diabetic neuropathy is when diabetes causes damage to your nerves. Over time, people with diabetes can get nerve damage throughout the body.  There are different types of diabetic neuropathy: Peripheral neuropathy. This is the most common type. It damages the peripheral nerves. These are nerves that send signals between the spinal cord and other parts of the body. This type usually affects the feet, legs, hands, and arms. Autonomic neuropathy. This type causes damage to autonomic nerves. These nerves control things that your body does automatically and that you don't control. This includes the heartbeat, body temperature, blood pressure, peeing, digestion, sweating, and sexual function. It can also affect your body's response to changes in blood sugar (glucose). Focal neuropathy. This type affects one area of the body, like an arm, a leg, or the face. It may involve one nerve or a small group of nerves. It can be painful and hard to predict. It occurs most often in older adults with diabetes. This type may come on suddenly. It usually gets better over time and doesn't cause long-term problems. Proximal neuropathy. This type affects the nerves of the thighs, hips, butt, or legs. It causes very bad pain, weakness, and muscle death, usually in the thigh muscles. It's more common among older men and people who have type 2 diabetes. The time it takes to recover may vary. What are the causes? Peripheral, autonomic, and focal neuropathies are caused by diabetes that's not well controlled with treatment.  The cause of proximal neuropathy isn't known. It may be linked to irritation and swelling caused by not controlling blood sugar levels. What are the signs or symptoms? Peripheral neuropathy The nerve damage happens slowly over time. When the nerves of the feet and legs no longer work, you may have: Burning, stabbing, or aching pain in the legs or  feet. Cramping in the legs or feet. Loss of feeling (numbness) and not being able to feel pressure or pain in the feet. This can lead to: Thick calluses or sores, also called ulcers, on areas of constant pressure. Not being able to feel temperature changes. Changes in the shape of the feet. Muscle weakness. Loss of balance or coordination. Autonomic neuropathy Symptoms vary depending on which nerves are affected. Symptoms may include: Problems with digestion, like: Throwing up or feeling like you may throw up. Poor appetite. Bloating. Watery poop (diarrhea) or trouble pooping (constipation). Trouble swallowing. Losing weight without trying to. Problems with the heart, blood, and lungs, such as: Feeling dizzy, especially when standing up. Fainting. Shortness of breath. Irregular heartbeat. Bladder problems, such as: Trouble starting or stopping when you pee. Leaking pee. Trouble emptying the bladder. Urinary tract infections (UTIs). Problems with other body functions, such as: Sweat. You may sweat too much or too little. Temperature. You might get hot easily. Or, you might feel cold more than usual. Sexual function. Males may not be able to get or keep an erection. Females may have vaginal dryness and trouble with arousal. Focal neuropathy Symptoms affect only one area of the body. Common symptoms include: Numbness or tingling. Burning pain. A prickling feeling. Very sensitive skin. Weakness. Not being able to move (paralysis). Muscle twitching. Muscles getting smaller, or wasting. Poor coordination. Double or blurred vision. Proximal neuropathy Sudden, very bad pain in the hip, thigh, or butt. Pain may spread from the back into the legs. This is called sciatica. Pain and numbness in the arms and legs. Tingling. Losing control of peeing  and pooping. Weakness and wasting of thigh muscles. Trouble getting up from a seated position. Swelling of the belly. Losing weight  without trying to. How is this diagnosed? Diagnosis varies depending on the type of neuropathy your health care provider thinks you have. It may include: A neurologic exam. This exam checks: Your reflexes. How you move. What you can feel. Blood tests. Lumbar puncture. This tests the fluid that surrounds the spinal cord. Imaging tests, such as: A CT scan. An MRI. Tests on the nerves, such as: Electromyogram (EMG). This checks the nerves that control muscles. Nerve conduction study. This test checks how quickly signals pass through your nerves. Biopsy. This is when a small piece of a nerve is removed for testing. For autonomic neuropathy, you may have tests to check things like: Your blood pressure. Your heart rate. Your breathing. Your digestion. Your bladder. There's no specific test for proximal neuropathy. But you may have tests to rule out other causes of your symptoms. How is this treated? The goal of treatment is to keep nerve damage from getting worse. Treatment may include: Following your diabetes management plan. This will help keep your blood sugar level and your A1C level within your target range. This is the most important treatment. Taking pain medicine. Follow these instructions at home: Diabetes management Follow your diabetes management plan as told by your provider. Check your blood sugar levels. Keep your blood sugar in your target range. Have your A1C level checked at least 2 times a year, or as often as told. Take your medicines only as told. This includes insulin  and diabetes medicine. Lifestyle  Do not smoke, vape, or use nicotine or tobacco. Be physically active every day. Include strength training and balance exercises. Follow a healthy meal plan. Work with your provider to manage your blood pressure. General instructions Ask your provider if it's safe to drive or use machines while taking your medicine. Check your skin and feet every day  for: Cuts. Bruises. Redness. Blisters. Sores. Contact a health care provider if: You have any new symptoms. Your symptoms get worse. Your symptoms don't get better with treatment. Get help right away if: You have sudden weakness or loss of coordination. You have trouble speaking. You have pain or pressure in your chest. You're not able to move a part of your body all of a sudden. These symptoms may be an emergency. Call 911 right away. Do not wait to see if the symptoms will go away. Do not drive yourself to the hospital. This information is not intended to replace advice given to you by your health care provider. Make sure you discuss any questions you have with your health care provider. Document Revised: 10/26/2022 Document Reviewed: 10/26/2022 Elsevier Patient Education  2025 [employer].

## 2023-11-21 NOTE — Assessment & Plan Note (Signed)
 Blood pressure elevated at 156/68 mmHg, systolic high, diastolic normal. Initial BP checked was 143/64 Plan to follow up with PCP in 2 weeks for reassessment

## 2023-11-21 NOTE — Assessment & Plan Note (Signed)
-   Start Cymbalta  for neuropathy. - Monitor for constipation and consider Miralax  if needed. - Consider referral to a neurologist if no improvement with Cymbalta .

## 2023-11-21 NOTE — Progress Notes (Signed)
 SUBJECTIVE:   CHIEF COMPLAINT / HPI:   Discussed the use of AI scribe software for clinical note transcription with the patient, who gave verbal consent to proceed.  History of Present Illness   [name] is an 89 year old male with hypertension and diabetes who presents for a medication refill.  He is experiencing difficulty obtaining refills for gabapentin , which he uses for nerve pain. After going without it for three to four days, he experienced withdrawal symptoms such as tremors and nervousness. He is concerned about ensuring timely refills to avoid these symptoms.  He has a history of hypertension. His current medications include Lipitor 40 mg daily for cholesterol management.  He manages his diabetes with Jardiance  25 mg once daily and has been off insulin  for the past four to five months. His last hemoglobin A1c was checked in May.  He experiences nerve pain, particularly in his feet, described as numbness. He currently uses gabapentin , taking 300 mg in the morning and 600 mg at bedtime. He has concerns about starting Cymbalta  60 mg daily for neuropathy due to potential constipation.  He has a history of gout, for which he takes allopurinol  100 mg daily. He also uses Spiriva  18 mcg in the morning and albuterol  as needed for respiratory issues.  He reports a history of poor circulation and neuropathy, which he attributes to diabetes. He also mentions back pain and knee pain, which he associates with aging. He recalls a past procedure involving tubes in his legs to check circulation, which he believes may contribute to his groin pain.  He has a history of toe amputation on the right foot due to an infection following a toenail cut. He reports numbness and cold sensation in his feet.  His current medication regimen also includes baby aspirin , levocetirizine 5 mg twice daily for allergies, and Restasis  eye drops.       PERTINENT  PMH / PSH: PMHx reviewed  OBJECTIVE:    BP (!) 156/68   Pulse (!) 54   Ht 5' 11 (1.803 m)   Wt 183 lb 9.6 oz (83.3 kg)   SpO2 93%   BMI 25.61 kg/m   Physical Exam   VITALS: BP- 156/68 CHEST: Lungs clear to auscultation, no wheezing. CARDIOVASCULAR: Heart regular rate and rhythm, normal S1, S2, no murmurs. ABDOMEN: Abdomen soft, non-tender, normal bowel sounds. EXTREMITIES: Pulses reduced in extremities. Amputation of right first, second, and third toes. NEUROLOGICAL: Sensation intact in legs.       ASSESSMENT/PLAN:   Assessment & Plan   Assessment and Plan    Medication management and refills He required gabapentin  refill due to withdrawal symptoms. Spiriva  refills available but needs timely management. - Send prescription for gabapentin  to pharmacy. - Instruct him to call the pharmacy before picking up gabapentin  to ensure readiness.  Type 2 diabetes mellitus Managed with Jardiance , off insulin  for 4-5 months. A1c test needed to assess control. - Order A1c test and increased to 7.1 today - I called him with A1c test results. He is currently at goal and no need for medication adjustments. -Repeat A1C in 3 months.  Hypertension Blood pressure elevated at 156/68 mmHg, systolic high, diastolic normal. Initial BP checked was 143/64 Plan to follow up with PCP in 2 weeks for reassessment  Peripheral neuropathy - Start Cymbalta  for neuropathy. - Monitor for constipation and consider Miralax  if needed. - Consider referral to a neurologist if no improvement with Cymbalta .  Follow-Up Scheduled follow-up with Dr. Rhae and  podiatry appointment. - Scheduled follow-up appointment with Dr. Rhae on October 13 at 11 AM. - Confirm podiatry appointment on October 23.       Flu shot given today  Otto Fairly, MD [facility]

## 2023-12-15 ENCOUNTER — Other Ambulatory Visit: Payer: Self-pay

## 2023-12-15 DIAGNOSIS — M1A9XX0 Chronic gout, unspecified, without tophus (tophi): Secondary | ICD-10-CM

## 2023-12-15 MED ORDER — ALLOPURINOL 100 MG PO TABS: 100.0000 mg | ORAL_TABLET | Freq: Every day | ORAL | 1 refills | Status: AC

## 2023-12-16 ENCOUNTER — Other Ambulatory Visit: Payer: Self-pay | Admitting: Family Medicine

## 2023-12-16 DIAGNOSIS — E1165 Type 2 diabetes mellitus with hyperglycemia: Secondary | ICD-10-CM

## 2023-12-18 ENCOUNTER — Ambulatory Visit (INDEPENDENT_AMBULATORY_CARE_PROVIDER_SITE_OTHER): Payer: Self-pay

## 2023-12-18 VITALS — BP 120/62 | HR 70 | Ht 71.0 in | Wt 183.0 lb

## 2023-12-18 DIAGNOSIS — Z23 Encounter for immunization: Secondary | ICD-10-CM

## 2023-12-18 DIAGNOSIS — E1165 Type 2 diabetes mellitus with hyperglycemia: Secondary | ICD-10-CM

## 2023-12-18 DIAGNOSIS — Z794 Long term (current) use of insulin: Secondary | ICD-10-CM

## 2023-12-18 MED ORDER — GABAPENTIN 300 MG PO CAPS: ORAL_CAPSULE | ORAL | 0 refills | Status: DC

## 2023-12-18 NOTE — Patient Instructions (Addendum)
 It was wonderful to see you today.  Please bring ALL of your medications with you to every visit.   Today we talked about:  You peripheral neuropathy worsening on the L side. Continue your gabapentin  twice a day. Start taking the duloxetine  at night. See me back in 1 month for follow up. We can discuss if neurology referral is necessary at that time. Please use your cane and/or walker daily to prevent any falls. Follow up with me on Friday, 01/19/2024 at 3:15pm.  Thank you for choosing [facility].   Please call [phone] with any questions about today's appointment.  Please arrive at least 15 minutes prior to your scheduled appointments.   If you had blood work today, I will send you a MyChart message or a letter if results are normal. Otherwise, I will give you a call.   If you had a referral placed, they will call you to set up an appointment. Please give us  a call if you don't hear back in the next 2 weeks.   If you need additional refills before your next appointment, please call your pharmacy first.   You should follow up in our clinic in 1 month on 11/14 at 3:15pm.  Camie Dixons, DO Family Medicine

## 2023-12-20 NOTE — Assessment & Plan Note (Signed)
 Foot exam with poor sensation to R foot, unchanged and decreased sensation to L foot, unchanged.  - Continue gabapentin , maxed out at 900mg  daily 2/2 low Cr clearance - Continue duloxetine  60mg  however switch to taking it at night since patient states it makes him drowsy if he takes it during the day - Stressed the importance of using cane and/or walker to prevent falls. Patient is high risk with his height, age, and significant peripheral neuropathy and superimposing vascular disease affecting movement and sensation. Patient verbalizes understanding and tells me he will try to use his cane at the very least. - Follow up with me on Friday, 11/14 at 1515. Consider neurology referral at that time for neuropathic pain.

## 2023-12-20 NOTE — Progress Notes (Signed)
    SUBJECTIVE:   CHIEF COMPLAINT / HPI:   Nayib is an 89yo M who presents to the clinic for BP follow up and diabetic foot exam. He states he is doing well overall. He currently does not take any medication for HTN however had 2 consecutive elevated readings at last visit. Denies flushing, lightheadedness, dizziness, headaches, palpitations, SOB,  CP. He does complain of increasing neuropathic pain to the L foot with sharp sensation to the plantar aspect. He has amputation of R toes 1-3 and significant vascular disease. He's not sure if he picks up his R foot completely when he walks. He does not present to the office with a cane or walker but has been advised to due to high risk of fall.   PERTINENT  PMH / PSH: PAD, peripheral neuropathy, DM, gout  OBJECTIVE:   BP 120/62   Pulse 70   Ht 5' 11 (1.803 m)   Wt 183 lb (83 kg)   SpO2 95%   BMI 25.52 kg/m    General: A&O, NAD HEENT: No sign of trauma, EOM grossly intact Respiratory: normal WOB GI: non-distended  Extremities: no peripheral edema. Neuro: Normal gait, moves all four extremities appropriately Skin: no rashes visualized, R foot: digits 1-3 post amputation, normal appearing digits 4 and 5, well healed ulcer to base of 5th metatarsal at lateral edge, poor sensation throughout foot and lower leg, cool to touch, decreased pedal pulses; L foot: clean toenails and appearance of foot, decreased pedal pulses, decreased sensation to foot and lower leg, capillary refill of 3 seconds Psych: Appropriate mood and affect   ASSESSMENT/PLAN:   Assessment & Plan Type 2 diabetes mellitus with hyperglycemia, with long-term current use of insulin  (HCC) Foot exam with poor sensation to R foot, unchanged and decreased sensation to L foot, unchanged.  - Continue gabapentin , maxed out at 900mg  daily 2/2 low Cr clearance - Continue duloxetine  60mg  however switch to taking it at night since patient states it makes him drowsy if he takes it during  the day - Stressed the importance of using cane and/or walker to prevent falls. Patient is high risk with his height, age, and significant peripheral neuropathy and superimposing vascular disease affecting movement and sensation. Patient verbalizes understanding and tells me he will try to use his cane at the very least. - Follow up with me on Friday, 11/14 at 1515. Consider neurology referral at that time for neuropathic pain.  Encounter for immunization Given COVID vaccine with patient permission.   Camie Dixons, DO [facility]

## 2023-12-28 ENCOUNTER — Ambulatory Visit: Admitting: Podiatry

## 2023-12-28 ENCOUNTER — Encounter: Payer: Self-pay | Admitting: Podiatry

## 2023-12-28 DIAGNOSIS — M79675 Pain in left toe(s): Secondary | ICD-10-CM | POA: Diagnosis not present

## 2023-12-28 DIAGNOSIS — Z89411 Acquired absence of right great toe: Secondary | ICD-10-CM

## 2023-12-28 DIAGNOSIS — B351 Tinea unguium: Secondary | ICD-10-CM | POA: Diagnosis not present

## 2023-12-28 DIAGNOSIS — L84 Corns and callosities: Secondary | ICD-10-CM | POA: Diagnosis not present

## 2023-12-28 DIAGNOSIS — E1151 Type 2 diabetes mellitus with diabetic peripheral angiopathy without gangrene: Secondary | ICD-10-CM | POA: Diagnosis not present

## 2023-12-28 DIAGNOSIS — M79674 Pain in right toe(s): Secondary | ICD-10-CM | POA: Diagnosis not present

## 2023-12-28 NOTE — Progress Notes (Signed)
 This patient returns to my office for at risk foot care.  This patient requires this care by a professional since this patient will be at risk due to having diabetes and amputation 1,2 ,3 right toes.  This patient is unable to cut nails himself since the patient cannot reach his nails.These nails are painful walking and wearing shoes.  This patient presents for at risk foot care today.  General Appearance  Alert, conversant and in no acute stress.  Vascular  Dorsalis pedis and posterior tibial  pulses are  weakly palpable  bilaterally.  Capillary return is within normal limits  bilaterally. Temperature is within normal limits  bilaterally.  Neurologic  Senn-Weinstein monofilament wire test within normal limits  bilaterally. Muscle power within normal limits bilaterally.  Nails Thick disfigured discolored nails with subungual debris  from hallux to fifth toes left and 4-5 right foot. No evidence of bacterial infection or drainage bilaterally.  Orthopedic  No limitations of motion  feet .  No crepitus or effusions noted.  No bony pathology or digital deformities noted.  Amputation 1,2 , 3 right toes.  Skin  normotropic skin with no porokeratosis noted bilaterally.  No signs of infections or ulcers noted.   Callus dorsolateral aspect fifth met.  Onychomycosis  Pain in right toes  Pain in left toes Callus fight foot.  Consent was obtained for treatment procedures.   Mechanical debridement of nails 1-5  bilaterally performed with a nail nipper.  Filed with dremel without incident. Debride callus right foot.   Return office visit    4 months                  Told patient to return for periodic foot care and evaluation due to potential at risk complications.   Cordella Bold DPM

## 2024-01-16 ENCOUNTER — Other Ambulatory Visit: Payer: Self-pay

## 2024-01-16 DIAGNOSIS — Z794 Long term (current) use of insulin: Secondary | ICD-10-CM

## 2024-01-16 MED ORDER — EMPAGLIFLOZIN 25 MG PO TABS
25.0000 mg | ORAL_TABLET | Freq: Every day | ORAL | 3 refills | Status: AC
Start: 2024-01-16 — End: ?

## 2024-01-19 ENCOUNTER — Ambulatory Visit (INDEPENDENT_AMBULATORY_CARE_PROVIDER_SITE_OTHER): Payer: Self-pay

## 2024-01-19 VITALS — BP 159/73 | HR 70 | Ht 69.0 in | Wt 177.6 lb

## 2024-01-19 DIAGNOSIS — G63 Polyneuropathy in diseases classified elsewhere: Secondary | ICD-10-CM | POA: Diagnosis not present

## 2024-01-19 NOTE — Patient Instructions (Signed)
 It was wonderful to see you today.  Please bring ALL of your medications with you to every visit.   Today we talked about:  Your peripheral neuropathy. I am sorry the medications are not helping at this time. Let me know if you ever want me to refer you to neurology.   Thank you for choosing [facility].   Please call [phone] with any questions about today's appointment.  Please arrive at least 15 minutes prior to your scheduled appointments.   If you had blood work today, I will send you a MyChart message or a letter if results are normal. Otherwise, I will give you a call.   If you had a referral placed, they will call you to set up an appointment. Please give us  a call if you don't hear back in the next 2 weeks.   If you need additional refills before your next appointment, please call your pharmacy first.   You should follow up in our clinic in No follow-ups on file.  Camie Dixons, DO Family Medicine

## 2024-01-19 NOTE — Progress Notes (Signed)
    SUBJECTIVE:   CHIEF COMPLAINT / HPI:   [name] is an 89yo M who presents to the clinic for diabetic and neuropathic foot follow-up.  He presents with his cane today.  He states his pain is the same even with the 60 mg of duloxetine  nightly.  PERTINENT  PMH / PSH: PAD/PVD, peripheral neuropathy  OBJECTIVE:   BP (!) 159/73   Pulse 70   Ht 5' 9 (1.753 m)   Wt 177 lb 9.6 oz (80.6 kg)   SpO2 96%   BMI 26.23 kg/m   General: A&O, NAD HEENT: No sign of trauma, EOM grossly intact Respiratory: normal WOB GI: non-distended  Extremities: no peripheral edema. Neuro: Normal gait, moves all four extremities appropriately, decreased sensation to bilateral lower extremities Skin: no lesions/rashes visualized, left foot cool to the touch at baseline, right foot digits 1 through 3 amputated Psych: Appropriate mood and affect   ASSESSMENT/PLAN:   Assessment & Plan Polyneuropathy associated with underlying disease Secondary to diabetes and component of significant underlying PAD.  Offered neurology consult, patient has declined at this time.  Emphasized nonpharmacological symptomatic relief such as heating pad, warm baths, etc.  Emphasized importance of cane to walk to prevent falls.  Camie Dixons, DO [facility]

## 2024-02-03 ENCOUNTER — Other Ambulatory Visit: Payer: Self-pay

## 2024-02-03 DIAGNOSIS — J449 Chronic obstructive pulmonary disease, unspecified: Secondary | ICD-10-CM

## 2024-02-13 ENCOUNTER — Other Ambulatory Visit: Payer: Self-pay

## 2024-02-13 DIAGNOSIS — Z794 Long term (current) use of insulin: Secondary | ICD-10-CM

## 2024-02-22 ENCOUNTER — Other Ambulatory Visit: Payer: Self-pay | Admitting: *Deleted

## 2024-02-22 DIAGNOSIS — J302 Other seasonal allergic rhinitis: Secondary | ICD-10-CM

## 2024-02-23 MED ORDER — LEVOCETIRIZINE DIHYDROCHLORIDE 5 MG PO TABS: 5.0000 mg | ORAL_TABLET | Freq: Two times a day (BID) | ORAL | 3 refills | Status: AC

## 2024-02-23 MED ORDER — FAMOTIDINE 20 MG PO TABS: 20.0000 mg | ORAL_TABLET | Freq: Two times a day (BID) | ORAL | 1 refills | Status: AC

## 2024-03-14 ENCOUNTER — Ambulatory Visit: Admitting: Podiatry

## 2024-03-14 ENCOUNTER — Encounter: Payer: Self-pay | Admitting: Podiatry

## 2024-03-14 DIAGNOSIS — B351 Tinea unguium: Secondary | ICD-10-CM

## 2024-03-14 DIAGNOSIS — M79675 Pain in left toe(s): Secondary | ICD-10-CM | POA: Diagnosis not present

## 2024-03-14 DIAGNOSIS — E1151 Type 2 diabetes mellitus with diabetic peripheral angiopathy without gangrene: Secondary | ICD-10-CM

## 2024-03-14 DIAGNOSIS — Z89411 Acquired absence of right great toe: Secondary | ICD-10-CM

## 2024-03-14 DIAGNOSIS — M79674 Pain in right toe(s): Secondary | ICD-10-CM

## 2024-03-14 DIAGNOSIS — L84 Corns and callosities: Secondary | ICD-10-CM

## 2024-03-14 NOTE — Progress Notes (Signed)
 This patient returns to my office for at risk foot care.  This patient requires this care by a professional since this patient will be at risk due to having diabetes and amputation 1,2 ,3 right toes.  This patient is unable to cut nails himself since the patient cannot reach his nails.These nails are painful walking and wearing shoes.  This patient presents for at risk foot care today.  General Appearance  Alert, conversant and in no acute stress.  Vascular  Dorsalis pedis and posterior tibial  pulses are  weakly palpable  bilaterally.  Capillary return is within normal limits  bilaterally. Temperature is within normal limits  bilaterally.  Neurologic  Senn-Weinstein monofilament wire test within normal limits  bilaterally. Muscle power within normal limits bilaterally.  Nails Thick disfigured discolored nails with subungual debris  from hallux to fifth toes left and 4-5 right foot. No evidence of bacterial infection or drainage bilaterally.  Orthopedic  No limitations of motion  feet .  No crepitus or effusions noted.  No bony pathology or digital deformities noted.  Amputation 1,2 , 3 right toes.  Skin  normotropic skin with no porokeratosis noted bilaterally.  No signs of infections or ulcers noted.   Callus dorsolateral aspect fifth met.  Onychomycosis  Pain in right toes  Pain in left toes Callus fight foot.  Consent was obtained for treatment procedures.   Mechanical debridement of nails 1-5  bilaterally performed with a nail nipper.  Filed with dremel without incident. Debride callus right foot.   Return office visit    4 months                  Told patient to return for periodic foot care and evaluation due to potential at risk complications.   Cordella Bold DPM

## 2024-03-16 ENCOUNTER — Other Ambulatory Visit: Payer: Self-pay

## 2024-03-16 DIAGNOSIS — E1165 Type 2 diabetes mellitus with hyperglycemia: Secondary | ICD-10-CM

## 2024-03-19 ENCOUNTER — Other Ambulatory Visit: Payer: Self-pay | Admitting: *Deleted

## 2024-03-19 MED ORDER — APIXABAN 2.5 MG PO TABS: 2.5000 mg | ORAL_TABLET | Freq: Two times a day (BID) | ORAL | 3 refills | Status: AC

## 2024-03-19 MED ORDER — TIOTROPIUM BROMIDE 18 MCG IN CAPS: 18.0000 ug | ORAL_CAPSULE | Freq: Every day | RESPIRATORY_TRACT | 5 refills | Status: AC

## 2024-03-19 NOTE — Telephone Encounter (Signed)
 Rx request for tiotropium 18 mcg cap inhaler. Please advise. Maurisha Mongeau [place], CMA

## 2024-03-19 NOTE — Addendum Note (Signed)
 Addended byBETHA LUPIE CREDIT on: 03/19/2024 08:03 PM   Modules accepted: Orders

## 2024-07-11 ENCOUNTER — Ambulatory Visit: Admitting: Podiatry

## 2024-11-18 ENCOUNTER — Encounter
